# Patient Record
Sex: Male | Born: 1950
Health system: Southern US, Community
[De-identification: ages and names within clinical notes are randomized; demographics above are authoritative.]

## PROBLEM LIST (undated history)

## (undated) DIAGNOSIS — I724 Aneurysm of artery of lower extremity: Secondary | ICD-10-CM

## (undated) DIAGNOSIS — M1990 Unspecified osteoarthritis, unspecified site: Secondary | ICD-10-CM

## (undated) DIAGNOSIS — I739 Peripheral vascular disease, unspecified: Secondary | ICD-10-CM

## (undated) DIAGNOSIS — N200 Calculus of kidney: Secondary | ICD-10-CM

## (undated) DIAGNOSIS — F1010 Alcohol abuse, uncomplicated: Secondary | ICD-10-CM

## (undated) DIAGNOSIS — I10 Essential (primary) hypertension: Secondary | ICD-10-CM

## (undated) DIAGNOSIS — J449 Chronic obstructive pulmonary disease, unspecified: Secondary | ICD-10-CM

## (undated) DIAGNOSIS — R569 Unspecified convulsions: Secondary | ICD-10-CM

## (undated) DIAGNOSIS — F0390 Unspecified dementia without behavioral disturbance: Secondary | ICD-10-CM

## (undated) DIAGNOSIS — M109 Gout, unspecified: Secondary | ICD-10-CM

## (undated) DIAGNOSIS — G40409 Other generalized epilepsy and epileptic syndromes, not intractable, without status epilepticus: Secondary | ICD-10-CM

## (undated) DIAGNOSIS — I639 Cerebral infarction, unspecified: Secondary | ICD-10-CM

## (undated) HISTORY — PX: TONSILLECTOMY: SUR1361

## (undated) HISTORY — DX: Cerebral infarction, unspecified: I63.9

## (undated) HISTORY — DX: Alcohol abuse, uncomplicated: F10.10

---

## 1978-09-09 DIAGNOSIS — G40409 Other generalized epilepsy and epileptic syndromes, not intractable, without status epilepticus: Secondary | ICD-10-CM

## 1978-09-09 HISTORY — PX: HIP SURGERY: SHX245

## 1978-09-09 HISTORY — PX: KIDNEY STONE SURGERY: SHX686

## 1978-09-09 HISTORY — DX: Other generalized epilepsy and epileptic syndromes, not intractable, without status epilepticus: G40.409

## 1978-09-09 HISTORY — PX: OTHER SURGICAL HISTORY: SHX169

## 1978-09-09 HISTORY — PX: WRIST SURGERY: SHX841

## 1998-06-23 ENCOUNTER — Ambulatory Visit (HOSPITAL_COMMUNITY): Admission: RE | Admit: 1998-06-23 | Discharge: 1998-06-23 | Payer: Self-pay | Admitting: Urology

## 2001-06-15 ENCOUNTER — Encounter: Payer: Self-pay | Admitting: Emergency Medicine

## 2001-06-15 ENCOUNTER — Emergency Department (HOSPITAL_COMMUNITY): Admission: EM | Admit: 2001-06-15 | Discharge: 2001-06-15 | Payer: Self-pay | Admitting: Emergency Medicine

## 2002-12-21 ENCOUNTER — Emergency Department (HOSPITAL_COMMUNITY): Admission: AD | Admit: 2002-12-21 | Discharge: 2002-12-21 | Payer: Self-pay | Admitting: Family Medicine

## 2003-04-22 ENCOUNTER — Emergency Department (HOSPITAL_COMMUNITY): Admission: EM | Admit: 2003-04-22 | Discharge: 2003-04-22 | Payer: Self-pay | Admitting: Emergency Medicine

## 2004-08-27 ENCOUNTER — Emergency Department (HOSPITAL_COMMUNITY): Admission: EM | Admit: 2004-08-27 | Discharge: 2004-08-27 | Payer: Self-pay | Admitting: *Deleted

## 2004-10-26 ENCOUNTER — Ambulatory Visit: Payer: Self-pay | Admitting: Internal Medicine

## 2004-11-09 ENCOUNTER — Ambulatory Visit: Payer: Self-pay | Admitting: Internal Medicine

## 2005-06-07 ENCOUNTER — Encounter: Admission: RE | Admit: 2005-06-07 | Discharge: 2005-06-07 | Payer: Self-pay | Admitting: Gastroenterology

## 2005-06-21 ENCOUNTER — Ambulatory Visit: Payer: Self-pay | Admitting: Internal Medicine

## 2005-07-10 ENCOUNTER — Ambulatory Visit (HOSPITAL_COMMUNITY): Admission: RE | Admit: 2005-07-10 | Discharge: 2005-07-10 | Payer: Self-pay | Admitting: Urology

## 2005-09-23 ENCOUNTER — Emergency Department (HOSPITAL_COMMUNITY): Admission: EM | Admit: 2005-09-23 | Discharge: 2005-09-23 | Payer: Self-pay | Admitting: Emergency Medicine

## 2005-09-25 ENCOUNTER — Ambulatory Visit: Payer: Self-pay | Admitting: Hospitalist

## 2005-10-16 ENCOUNTER — Ambulatory Visit: Payer: Self-pay | Admitting: Internal Medicine

## 2005-11-15 ENCOUNTER — Ambulatory Visit: Payer: Self-pay | Admitting: Hospitalist

## 2005-11-15 DIAGNOSIS — I10 Essential (primary) hypertension: Secondary | ICD-10-CM | POA: Insufficient documentation

## 2005-11-15 DIAGNOSIS — R569 Unspecified convulsions: Secondary | ICD-10-CM

## 2005-12-07 ENCOUNTER — Ambulatory Visit: Payer: Self-pay | Admitting: Hospitalist

## 2005-12-07 LAB — CONVERTED CEMR LAB
BUN: 11 mg/dL (ref 6–23)
CO2: 27 meq/L (ref 19–32)
Calcium: 9.1 mg/dL (ref 8.4–10.5)
Chloride: 96 meq/L (ref 96–112)
Creatinine, Ser: 0.91 mg/dL (ref 0.40–1.50)
Glucose, Bld: 85 mg/dL (ref 70–99)
Potassium: 4.8 meq/L (ref 3.5–5.3)
Sodium: 132 meq/L — ABNORMAL LOW (ref 135–145)

## 2006-01-25 DIAGNOSIS — F528 Other sexual dysfunction not due to a substance or known physiological condition: Secondary | ICD-10-CM | POA: Insufficient documentation

## 2006-04-25 ENCOUNTER — Emergency Department (HOSPITAL_COMMUNITY): Admission: EM | Admit: 2006-04-25 | Discharge: 2006-04-25 | Payer: Self-pay | Admitting: Emergency Medicine

## 2006-06-13 DIAGNOSIS — R609 Edema, unspecified: Secondary | ICD-10-CM

## 2006-06-20 ENCOUNTER — Telehealth: Payer: Self-pay | Admitting: *Deleted

## 2006-06-20 ENCOUNTER — Ambulatory Visit: Payer: Self-pay | Admitting: Internal Medicine

## 2006-06-20 ENCOUNTER — Ambulatory Visit (HOSPITAL_COMMUNITY): Admission: RE | Admit: 2006-06-20 | Discharge: 2006-06-20 | Payer: Self-pay | Admitting: Internal Medicine

## 2006-06-20 ENCOUNTER — Ambulatory Visit: Payer: Self-pay | Admitting: Vascular Surgery

## 2006-06-20 ENCOUNTER — Encounter (INDEPENDENT_AMBULATORY_CARE_PROVIDER_SITE_OTHER): Payer: Self-pay | Admitting: Unknown Physician Specialty

## 2007-01-17 ENCOUNTER — Telehealth: Payer: Self-pay | Admitting: *Deleted

## 2007-01-20 ENCOUNTER — Emergency Department (HOSPITAL_COMMUNITY): Admission: EM | Admit: 2007-01-20 | Discharge: 2007-01-20 | Payer: Self-pay | Admitting: Family Medicine

## 2007-01-23 ENCOUNTER — Telehealth: Payer: Self-pay | Admitting: Infectious Disease

## 2007-02-06 ENCOUNTER — Ambulatory Visit: Payer: Self-pay | Admitting: Internal Medicine

## 2007-02-06 ENCOUNTER — Encounter (INDEPENDENT_AMBULATORY_CARE_PROVIDER_SITE_OTHER): Payer: Self-pay | Admitting: Internal Medicine

## 2007-02-06 DIAGNOSIS — F17200 Nicotine dependence, unspecified, uncomplicated: Secondary | ICD-10-CM | POA: Insufficient documentation

## 2007-02-06 LAB — CONVERTED CEMR LAB
ALT: 23 units/L (ref 0–53)
AST: 21 units/L (ref 0–37)
Albumin: 4.4 g/dL (ref 3.5–5.2)
Alkaline Phosphatase: 112 units/L (ref 39–117)
BUN: 11 mg/dL (ref 6–23)
CO2: 26 meq/L (ref 19–32)
Calcium: 9.5 mg/dL (ref 8.4–10.5)
Carbamazepine Lvl: 11.3 ug/mL (ref 4.0–12.0)
Chloride: 97 meq/L (ref 96–112)
Creatinine, Ser: 0.95 mg/dL (ref 0.40–1.50)
Glucose, Bld: 82 mg/dL (ref 70–99)
HCT: 43.9 % (ref 39.0–52.0)
Hemoglobin: 15.7 g/dL (ref 13.0–17.0)
MCHC: 35.8 g/dL (ref 30.0–36.0)
MCV: 95 fL (ref 78.0–100.0)
Platelets: 348 10*3/uL (ref 150–400)
Potassium: 4.9 meq/L (ref 3.5–5.3)
RBC: 4.62 M/uL (ref 4.22–5.81)
RDW: 14.2 % (ref 11.5–15.5)
Sodium: 134 meq/L — ABNORMAL LOW (ref 135–145)
Total Bilirubin: 0.3 mg/dL (ref 0.3–1.2)
Total Protein: 7.2 g/dL (ref 6.0–8.3)
WBC: 11.2 10*3/uL — ABNORMAL HIGH (ref 4.0–10.5)

## 2008-11-18 ENCOUNTER — Encounter: Admission: RE | Admit: 2008-11-18 | Discharge: 2008-11-18 | Payer: Self-pay | Admitting: Family Medicine

## 2009-11-24 ENCOUNTER — Encounter: Admission: RE | Admit: 2009-11-24 | Discharge: 2009-11-24 | Payer: Self-pay | Admitting: Orthopedic Surgery

## 2010-09-09 DIAGNOSIS — I639 Cerebral infarction, unspecified: Secondary | ICD-10-CM

## 2010-09-09 HISTORY — DX: Cerebral infarction, unspecified: I63.9

## 2011-05-17 ENCOUNTER — Ambulatory Visit
Admission: RE | Admit: 2011-05-17 | Discharge: 2011-05-17 | Disposition: A | Discharge status: Home / Self Care | Payer: Medicare Other | Source: Ambulatory Visit | Attending: Family Medicine | Admitting: Family Medicine

## 2011-05-17 ENCOUNTER — Other Ambulatory Visit: Payer: Self-pay | Admitting: Family Medicine

## 2011-05-17 DIAGNOSIS — F17200 Nicotine dependence, unspecified, uncomplicated: Secondary | ICD-10-CM

## 2011-05-17 DIAGNOSIS — R634 Abnormal weight loss: Secondary | ICD-10-CM

## 2011-10-18 ENCOUNTER — Other Ambulatory Visit: Payer: Self-pay | Admitting: Cardiology

## 2011-10-18 DIAGNOSIS — R0989 Other specified symptoms and signs involving the circulatory and respiratory systems: Secondary | ICD-10-CM

## 2011-10-23 ENCOUNTER — Other Ambulatory Visit: Payer: Self-pay | Admitting: *Deleted

## 2011-10-23 ENCOUNTER — Encounter (INDEPENDENT_AMBULATORY_CARE_PROVIDER_SITE_OTHER): Payer: Medicare Other

## 2011-10-23 DIAGNOSIS — I70219 Atherosclerosis of native arteries of extremities with intermittent claudication, unspecified extremity: Secondary | ICD-10-CM

## 2011-10-23 DIAGNOSIS — R0989 Other specified symptoms and signs involving the circulatory and respiratory systems: Secondary | ICD-10-CM

## 2011-10-23 DIAGNOSIS — I70229 Atherosclerosis of native arteries of extremities with rest pain, unspecified extremity: Secondary | ICD-10-CM

## 2011-10-23 DIAGNOSIS — I739 Peripheral vascular disease, unspecified: Secondary | ICD-10-CM

## 2011-10-24 ENCOUNTER — Encounter: Payer: Self-pay | Admitting: Cardiovascular Disease

## 2011-10-24 ENCOUNTER — Ambulatory Visit (INDEPENDENT_AMBULATORY_CARE_PROVIDER_SITE_OTHER): Payer: Medicare Other | Admitting: Cardiovascular Disease

## 2011-10-24 VITALS — BP 146/86 | HR 84 | Ht 69.0 in | Wt 163.0 lb

## 2011-10-24 DIAGNOSIS — Z01818 Encounter for other preprocedural examination: Secondary | ICD-10-CM

## 2011-10-24 DIAGNOSIS — F17200 Nicotine dependence, unspecified, uncomplicated: Secondary | ICD-10-CM

## 2011-10-24 DIAGNOSIS — I739 Peripheral vascular disease, unspecified: Secondary | ICD-10-CM

## 2011-10-24 LAB — BASIC METABOLIC PANEL
BUN: 9 mg/dL (ref 6–23)
CO2: 29 mEq/L (ref 19–32)
Chloride: 94 mEq/L — ABNORMAL LOW (ref 96–112)
Glucose, Bld: 87 mg/dL (ref 70–99)
Potassium: 4.5 mEq/L (ref 3.5–5.1)

## 2011-10-24 LAB — CBC WITH DIFFERENTIAL/PLATELET
Basophils Relative: 0.4 % (ref 0.0–3.0)
Eosinophils Absolute: 0.2 10*3/uL (ref 0.0–0.7)
HCT: 45.5 % (ref 39.0–52.0)
Hemoglobin: 15.2 g/dL (ref 13.0–17.0)
Lymphocytes Relative: 29.4 % (ref 12.0–46.0)
MCHC: 33.4 g/dL (ref 30.0–36.0)
Neutro Abs: 5.9 10*3/uL (ref 1.4–7.7)
RBC: 4.42 Mil/uL (ref 4.22–5.81)

## 2011-10-24 LAB — PROTIME-INR: Prothrombin Time: 11.5 s (ref 10.2–12.4)

## 2011-10-24 MED ORDER — OXYCODONE HCL 5 MG PO CAPS: 5.0000 mg | ORAL_CAPSULE | Freq: Four times a day (QID) | ORAL | Status: DC | PRN

## 2011-10-24 NOTE — Patient Instructions (Addendum)
Your physician recommends that you return for lab work in: today  Your physician recommends that you continue on your current medications as directed. Please refer to the Current Medication list given to you today.  You physician recommends that you have an Abdominal Aortagram  Your physician recommends that you schedule a follow-up appointment in: after procedure

## 2011-10-25 ENCOUNTER — Encounter (HOSPITAL_COMMUNITY): Payer: Self-pay | Admitting: Pharmacy Technician

## 2011-10-28 DIAGNOSIS — I739 Peripheral vascular disease, unspecified: Secondary | ICD-10-CM | POA: Insufficient documentation

## 2011-10-28 NOTE — Progress Notes (Signed)
HPI  This is a 61 year old African American male who is here today for evaluation and management of recently diagnosed peripheral arterial disease. He was referred from [facility] to our vascular lab for ABI and arterial duplex. The patient went there for evaluation of severe pain in both feet and calves. This started about 2 months ago and has been progressive since then. It's worse on the right side and currently happening with ambulation of less than 50 feet. He also has discomfort at rest especially at night while lying down. He denies any lower extremity ulceration. He has prolonged history of tobacco use. He smokes 2 packs per day and has been doing so for 40 years. He also reports having a stroke in 2012 and thought that the discomfort was due to that. He reports no previous cardiac history. His other risk factors include hypertension but no known diabetes or hyperlipidemia. He denies any chest pain. He has chronic dyspnea without orthopnea or lower extremity edema.   Allergies  Allergen Reactions  . Penicillins Nausea And Vomiting     Current Outpatient Prescriptions on File Prior to Visit  Medication Sig Dispense Refill  . EPITOL 200 MG tablet Take 400 mg by mouth 3 (three) times daily.       [name] gabapentin (NEURONTIN) 600 MG tablet Take 600 mg by mouth 3 (three) times daily.       [name] levETIRAcetam (KEPPRA) 500 MG tablet Take 500 mg by mouth 2 (two) times daily.       [name] lisinopril (PRINIVIL,ZESTRIL) 20 MG tablet Take 20 mg by mouth daily.          No past medical history on file.   No past surgical history on file.   No family history on file.   History   Social History  . Marital Status: Married    Spouse Name: N/A    Number of Children: N/A  . Years of Education: N/A   Occupational History  . Not on file.   Social History Main Topics  . Smoking status: Current Every Day Smoker -- 1.5 packs/day for 40 years    Types: Cigarettes  . Smokeless tobacco: Not on  file  . Alcohol Use: No  . Drug Use: No  . Sexually Active: Not on file   Other Topics Concern  . Not on file   Social History Narrative  . No narrative on file     ROS Constitutional: Negative for fever, chills, diaphoresis, activity change, appetite change and fatigue.  HENT: Negative for hearing loss, nosebleeds, congestion, sore throat, facial swelling, drooling, trouble swallowing, neck pain, voice change, sinus pressure and tinnitus.  Eyes: Negative for photophobia, pain, discharge and visual disturbance.  Respiratory: Negative for apnea, cough, chest tightness, shortness of breath and wheezing.  Cardiovascular: Negative for chest pain, palpitations and leg swelling.  Gastrointestinal: Negative for nausea, vomiting, abdominal pain, diarrhea, constipation, blood in stool and abdominal distention.  Genitourinary: Negative for dysuria, urgency, frequency, hematuria and decreased urine volume.  Skin: Negative for color change, pallor, rash and wound.  Neurological: Negative for dizziness, tremors, seizures, syncope, speech difficulty, weakness, light-headedness, numbness and headaches.  Psychiatric/Behavioral: Negative for suicidal ideas, hallucinations, behavioral problems and agitation. The patient is not nervous/anxious.     PHYSICAL EXAM   BP 146/86  Pulse 84  Ht 5\' 9"  (1.753 m)  Wt 163 lb (73.936 kg)  BMI 24.07 kg/m2 Constitutional: He is oriented to person, place, and time. He appears well-developed  and well-nourished. No distress.  HENT: No nasal discharge.  Head: Normocephalic and atraumatic.  Eyes: Pupils are equal and round. Right eye exhibits no discharge. Left eye exhibits no discharge.  Neck: Normal range of motion. Neck supple. No JVD present. No thyromegaly present. No carotid bruits. Cardiovascular: Normal rate, regular rhythm, normal heart sounds and. Exam reveals no gallop and no friction rub. No murmur heard.  Pulmonary/Chest: Effort normal and breath  sounds normal. No stridor. No respiratory distress. He has no wheezes. He has no rales. He exhibits no tenderness.  Abdominal: Soft. Bowel sounds are normal. He exhibits no distension. There is no tenderness. There is no rebound and no guarding.  Musculoskeletal: Normal range of motion. He exhibits no edema and no tenderness.  Neurological: He is alert and oriented to person, place, and time. Coordination normal.  Skin: Skin is warm and dry. No rash noted. He is not diaphoretic. No erythema. No pallor.  Psychiatric: He has a normal mood and affect. His behavior is normal. Judgment and thought content normal.   vascular: Radial pulse: +2 on the right side and +1 on the left side. Femoral pulse: Not palpable bilaterally. Distal pulses are not palpable.     EKG: NORMAL SINUS RHYTHM, LEFT ANTERIOR FASCICULAR BLOCK. NO SIGNIFICANT ST OR T WAVE CHANGES.   ASSESSMENT AND PLAN

## 2011-10-28 NOTE — Assessment & Plan Note (Signed)
The patient has evidence of severe peripheral arterial disease with early critical limb ischemia. Currently Rutherford class 4  Manifesting with rest pain without ulceration or skin breakdown. His ABI was severely reduced bilaterally less than 0.3 on the right side and 0.34 on the left side. There was monophasic waveform bilaterally in the common femoral artery suggestive of severe aortoiliac disease. There was also evidence of multilevel disease including the common femoral, SFA as well as bilateral popliteal artery occlusion. I recommend proceeding with angiography in order to plan revascularization. The best vascular access in this situation might be the right radial artery or possibly the right femoral artery although there is a chance of total occlusion. He will likely require both endovascular as well as surgical revascularization. Continue aspirin for now. I provided him with oxycodone 5 mg to be used every 6 hours as needed. Quantity of 60 was provided.

## 2011-10-28 NOTE — Assessment & Plan Note (Signed)
I had a prolonged discussion with him about the importance of smoking cessation. I explained to him the high risk of limb loss if he continues to smoke.

## 2011-10-31 ENCOUNTER — Other Ambulatory Visit: Payer: Self-pay | Admitting: *Deleted

## 2011-10-31 ENCOUNTER — Telehealth: Payer: Self-pay | Admitting: Surgery

## 2011-10-31 ENCOUNTER — Encounter (HOSPITAL_COMMUNITY)
Admission: RE | Disposition: A | Discharge status: Home / Self Care | Payer: Self-pay | Source: Ambulatory Visit | Attending: Cardiovascular Disease

## 2011-10-31 ENCOUNTER — Ambulatory Visit (HOSPITAL_COMMUNITY)
Admission: RE | Admit: 2011-10-31 | Discharge: 2011-10-31 | Disposition: A | Discharge status: Home / Self Care | Payer: Medicare Other | Source: Ambulatory Visit | Attending: Cardiovascular Disease | Admitting: Cardiovascular Disease

## 2011-10-31 DIAGNOSIS — I739 Peripheral vascular disease, unspecified: Secondary | ICD-10-CM

## 2011-10-31 DIAGNOSIS — I70219 Atherosclerosis of native arteries of extremities with intermittent claudication, unspecified extremity: Secondary | ICD-10-CM

## 2011-10-31 DIAGNOSIS — I6529 Occlusion and stenosis of unspecified carotid artery: Secondary | ICD-10-CM

## 2011-10-31 DIAGNOSIS — I708 Atherosclerosis of other arteries: Secondary | ICD-10-CM | POA: Insufficient documentation

## 2011-10-31 HISTORY — PX: ABDOMINAL AORTAGRAM: SHX5454

## 2011-10-31 HISTORY — PX: OTHER SURGICAL HISTORY: SHX169

## 2011-10-31 SURGERY — ABDOMINAL AORTAGRAM
Anesthesia: LOCAL

## 2011-10-31 MED ORDER — ASPIRIN 81 MG PO CHEW
CHEWABLE_TABLET | ORAL | Status: AC
  Filled 2011-10-31: qty 4

## 2011-10-31 MED ORDER — LABETALOL HCL 5 MG/ML IV SOLN
INTRAVENOUS | Status: AC
  Filled 2011-10-31: qty 4

## 2011-10-31 MED ORDER — LISINOPRIL 20 MG PO TABS
20.0000 mg | ORAL_TABLET | Freq: Every day | ORAL | Status: DC
  Administered 2011-10-31: 20 mg via ORAL
  Filled 2011-10-31: qty 1

## 2011-10-31 MED ORDER — HEPARIN (PORCINE) IN NACL 2-0.9 UNIT/ML-% IJ SOLN
INTRAMUSCULAR | Status: AC
  Filled 2011-10-31: qty 1000

## 2011-10-31 MED ORDER — LEVETIRACETAM 500 MG PO TABS
500.0000 mg | ORAL_TABLET | Freq: Two times a day (BID) | ORAL | Status: DC
  Administered 2011-10-31: 500 mg via ORAL
  Filled 2011-10-31 (×2): qty 1

## 2011-10-31 MED ORDER — SODIUM CHLORIDE 0.9 % IJ SOLN: 3.0000 mL | INTRAMUSCULAR | Status: DC | PRN

## 2011-10-31 MED ORDER — SODIUM CHLORIDE 0.9 % IV SOLN: INTRAVENOUS | Status: DC

## 2011-10-31 MED ORDER — CARBAMAZEPINE 200 MG PO TABS
400.0000 mg | ORAL_TABLET | Freq: Three times a day (TID) | ORAL | Status: DC
  Administered 2011-10-31: 400 mg via ORAL
  Filled 2011-10-31 (×3): qty 2

## 2011-10-31 MED ORDER — SODIUM CHLORIDE 0.9 % IV SOLN: 250.0000 mL | INTRAVENOUS | Status: DC | PRN

## 2011-10-31 MED ORDER — HYDRALAZINE HCL 20 MG/ML IJ SOLN
INTRAMUSCULAR | Status: AC
  Filled 2011-10-31: qty 1

## 2011-10-31 MED ORDER — GABAPENTIN 600 MG PO TABS
600.0000 mg | ORAL_TABLET | Freq: Three times a day (TID) | ORAL | Status: DC
  Administered 2011-10-31: 600 mg via ORAL
  Filled 2011-10-31 (×3): qty 1

## 2011-10-31 MED ORDER — MORPHINE SULFATE 4 MG/ML IJ SOLN
2.0000 mg | INTRAMUSCULAR | Status: DC | PRN
  Administered 2011-10-31: 2 mg via INTRAVENOUS
  Filled 2011-10-31: qty 1

## 2011-10-31 MED ORDER — ASPIRIN 81 MG PO CHEW
324.0000 mg | CHEWABLE_TABLET | ORAL | Status: AC
  Administered 2011-10-31: 324 mg via ORAL

## 2011-10-31 MED ORDER — MIDAZOLAM HCL 2 MG/2ML IJ SOLN
INTRAMUSCULAR | Status: AC
  Filled 2011-10-31: qty 2

## 2011-10-31 MED ORDER — ACETAMINOPHEN 325 MG PO TABS: 650.0000 mg | ORAL_TABLET | ORAL | Status: DC | PRN

## 2011-10-31 MED ORDER — LIDOCAINE HCL (PF) 1 % IJ SOLN
INTRAMUSCULAR | Status: AC
  Filled 2011-10-31: qty 30

## 2011-10-31 MED ORDER — SODIUM CHLORIDE 0.9 % IJ SOLN: 3.0000 mL | Freq: Two times a day (BID) | INTRAMUSCULAR | Status: DC

## 2011-10-31 MED ORDER — MORPHINE SULFATE 2 MG/ML IJ SOLN
INTRAMUSCULAR | Status: AC
  Filled 2011-10-31: qty 1

## 2011-10-31 MED ORDER — FENTANYL CITRATE 0.05 MG/ML IJ SOLN
INTRAMUSCULAR | Status: AC
  Filled 2011-10-31: qty 2

## 2011-10-31 MED ORDER — SODIUM CHLORIDE 0.9 % IV SOLN
INTRAVENOUS | Status: DC
  Administered 2011-10-31: 1000 mL via INTRAVENOUS

## 2011-10-31 NOTE — Telephone Encounter (Addendum)
Message copied by Shari Prows on Wed Oct 31, 2011  1:39 PM ------      Message from: Melene Plan      Created: Wed Oct 31, 2011 12:09 PM       Have fun!!!      ----- Message -----         From: Nada Libman, MD         Sent: 10/31/2011   9:39 AM           To: Melene Plan, RN            Please schedule patient to see me in the office for aorto bifem.  Patient of Dr. Kirke Corin.  HAve him get carotid dopplers prior to seeing me.  I need to see him Monday.  We need to contact him       I scheduled an appt for the above pt for Monday 11/05/11 at 3pm for a carotid doppler and to see VWB at 4pm. I left a message for the pt at both ph#'s and also mailed an appt letter.awt

## 2011-10-31 NOTE — H&P (View-Only) (Signed)
  HPI  This is a 61-year-old African American male who is here today for evaluation and management of recently diagnosed peripheral arterial disease. He was referred from [facility] to our vascular lab for ABI and arterial duplex. The patient went there for evaluation of severe pain in both feet and calves. This started about 2 months ago and has been progressive since then. It's worse on the right side and currently happening with ambulation of less than 50 feet. He also has discomfort at rest especially at night while lying down. He denies any lower extremity ulceration. He has prolonged history of tobacco use. He smokes 2 packs per day and has been doing so for 40 years. He also reports having a stroke in 2012 and thought that the discomfort was due to that. He reports no previous cardiac history. His other risk factors include hypertension but no known diabetes or hyperlipidemia. He denies any chest pain. He has chronic dyspnea without orthopnea or lower extremity edema.   Allergies  Allergen Reactions  . Penicillins Nausea And Vomiting     Current Outpatient Prescriptions on File Prior to Visit  Medication Sig Dispense Refill  . EPITOL 200 MG tablet Take 400 mg by mouth 3 (three) times daily.       . gabapentin (NEURONTIN) 600 MG tablet Take 600 mg by mouth 3 (three) times daily.       . levETIRAcetam (KEPPRA) 500 MG tablet Take 500 mg by mouth 2 (two) times daily.       . lisinopril (PRINIVIL,ZESTRIL) 20 MG tablet Take 20 mg by mouth daily.          No past medical history on file.   No past surgical history on file.   No family history on file.   History   Social History  . Marital Status: Married    Spouse Name: N/A    Number of Children: N/A  . Years of Education: N/A   Occupational History  . Not on file.   Social History Main Topics  . Smoking status: Current Every Day Smoker -- 1.5 packs/day for 40 years    Types: Cigarettes  . Smokeless tobacco: Not on  file  . Alcohol Use: No  . Drug Use: No  . Sexually Active: Not on file   Other Topics Concern  . Not on file   Social History Narrative  . No narrative on file     ROS Constitutional: Negative for fever, chills, diaphoresis, activity change, appetite change and fatigue.  HENT: Negative for hearing loss, nosebleeds, congestion, sore throat, facial swelling, drooling, trouble swallowing, neck pain, voice change, sinus pressure and tinnitus.  Eyes: Negative for photophobia, pain, discharge and visual disturbance.  Respiratory: Negative for apnea, cough, chest tightness, shortness of breath and wheezing.  Cardiovascular: Negative for chest pain, palpitations and leg swelling.  Gastrointestinal: Negative for nausea, vomiting, abdominal pain, diarrhea, constipation, blood in stool and abdominal distention.  Genitourinary: Negative for dysuria, urgency, frequency, hematuria and decreased urine volume.  Skin: Negative for color change, pallor, rash and wound.  Neurological: Negative for dizziness, tremors, seizures, syncope, speech difficulty, weakness, light-headedness, numbness and headaches.  Psychiatric/Behavioral: Negative for suicidal ideas, hallucinations, behavioral problems and agitation. The patient is not nervous/anxious.     PHYSICAL EXAM   BP 146/86  Pulse 84  Ht 5' 9" (1.753 m)  Wt 163 lb (73.936 kg)  BMI 24.07 kg/m2 Constitutional: He is oriented to person, place, and time. He appears well-developed   and well-nourished. No distress.  HENT: No nasal discharge.  Head: Normocephalic and atraumatic.  Eyes: Pupils are equal and round. Right eye exhibits no discharge. Left eye exhibits no discharge.  Neck: Normal range of motion. Neck supple. No JVD present. No thyromegaly present. No carotid bruits. Cardiovascular: Normal rate, regular rhythm, normal heart sounds and. Exam reveals no gallop and no friction rub. No murmur heard.  Pulmonary/Chest: Effort normal and breath  sounds normal. No stridor. No respiratory distress. He has no wheezes. He has no rales. He exhibits no tenderness.  Abdominal: Soft. Bowel sounds are normal. He exhibits no distension. There is no tenderness. There is no rebound and no guarding.  Musculoskeletal: Normal range of motion. He exhibits no edema and no tenderness.  Neurological: He is alert and oriented to person, place, and time. Coordination normal.  Skin: Skin is warm and dry. No rash noted. He is not diaphoretic. No erythema. No pallor.  Psychiatric: He has a normal mood and affect. His behavior is normal. Judgment and thought content normal.   vascular: Radial pulse: +2 on the right side and +1 on the left side. Femoral pulse: Not palpable bilaterally. Distal pulses are not palpable.     EKG: NORMAL SINUS RHYTHM, LEFT ANTERIOR FASCICULAR BLOCK. NO SIGNIFICANT ST OR T WAVE CHANGES.   ASSESSMENT AND PLAN   

## 2011-10-31 NOTE — Interval H&P Note (Signed)
History and Physical Interval Note:  10/31/2011 8:37 AM  Lee [name]  has presented today for surgery, with the diagnosis of pvd  The various methods of treatment have been discussed with the patient and family. After consideration of risks, benefits and other options for treatment, the patient has consented to  Procedure(s) (LRB) with comments: ABDOMINAL AORTAGRAM (N/A) as a surgical intervention .  The patient's history has been reviewed, patient examined, no change in status, stable for surgery.  I have reviewed the patient's chart and labs.  Questions were answered to the patient's satisfaction.     Lorine Bears

## 2011-10-31 NOTE — CV Procedure (Signed)
     PERIPHERAL VASCULAR PROCEDURE  NAME:  Lee [name]   MRN: [id number] DOB:  Jan 04, 1951   ADMIT DATE: 10/31/2011  Performing Cardiologist: Lorine Bears Primary Physician: No primary provider on file.   Procedures Performed:  Abdominal Aortic Angiogram with Bi-Iliofemoral Runoff  Bilateral Lower Extremity Angiography (1st Order)  Indication(s):   Claudication with rest pain and severely reduced ABI bilaterally   Consent: The procedure with Risks/Benefits/Alternatives and Indications was reviewed with the patient .  All questions were answered.  Medications:  Sedation:  1 mg IV Versed, 25 mcg IV Fentanyl  Contrast:  175 mL of Visipaque   Procedural details: The right groin was prepped, draped, and anesthetized with 1% lidocaine. There was no femoral palpable pulse. However, the vessel was heavily calcified on fluoroscopy. I used a micropuncture kit .the micropuncture sheath was introduced into artery without much difficulty. Angiography was performed through this sheath which showed an occluded right common iliac. The sheath was exchanged to a 5 [place] sheath. I was able to cross the occlusion with a Glidewire.  A 5 Fr Short Pigtail Catheter was advanced of over a Glidewire into the descending Aorta to a level just above the renal arteries. A power injection of 38ml/sec contrast over 1 sec was performed for Abdominal Aortic Angiography.  The catheter was then pulled back to a level just above the Aortic bifurcation, and a second power injection was performed to evaluate bi-ileiofemoral arteries with runnoff. Iliac angiography was performed in the LAO and RAO views.   T  Hemodynamics:  Central Aortic Pressure / Mean Aortic Pressure: 153/66.  Findings:  Abdominal aorta: Normal in size with no evidence of aneurysm. The aorta is heavily calcified.  Left renal artery: Normal.  Right renal artery: Normal.  Celiac artery: Patent.  Superior mesenteric artery: Patent with  no significant disease.  Right common iliac artery: Heavily calcified and subtotally occluded proximally.  Right internal iliac artery: 40% ostial disease. The mid segment is diffusely diseased.  Right external iliac artery: 20% diffuse disease.  Right common femoral artery: Heavily calcified and subtotally occluded.  Right profunda femoral artery: Normal in size and calcified. It has mild diffuse disease.  Right superficial femoral artery: Flush occlusion and heavily calcified. It reconstitutes via collaterals distally.  Right popliteal artery: Occluded.  Below the knee arteries are hypoperfused but there seems to be three-vessel runoff.   Left common iliac artery:  Heavily calcified with 70% proximal disease.  Left internal iliac artery: 60% proximal disease and diffusely disease in the midsegment.  Left external iliac artery: Occluded proximally.  Left common femoral artery: Heavily calcified and appears occluded. It reconstitutes in the distal segment with collaterals.  Left profunda femoral artery: Normal in size with diffuse mild disease.  Left superficial femoral artery:  Heavily calcified with diffuse 50% disease proximally. The distal segment has mild atherosclerosis.  Left popliteal artery: Occluded. The tibial arteries reconstitute distally with three-vessel runoff.  Conclusions: 1. occluded right common iliac with severe disease in the left common iliac artery. 2. Occluded left external iliac artery as well as bilateral common femoral arteries. 3. Flush occlusion of the right SFA with reconstitution distally. 4. Occluded popliteal arteries bilaterally with three-vessel runoff on both sides.  Recommendations:  Aorta bifemoral bypass is recommended. The case was discussed with Dr. Myra Gianotti who will see the patient.   Lorine Bears, MD, [facility] 10/31/2011 9:24 AM

## 2011-11-02 ENCOUNTER — Encounter: Payer: Self-pay | Admitting: Surgery

## 2011-11-05 ENCOUNTER — Ambulatory Visit (INDEPENDENT_AMBULATORY_CARE_PROVIDER_SITE_OTHER): Payer: Medicare Other | Admitting: Surgery

## 2011-11-05 ENCOUNTER — Encounter: Payer: Self-pay | Admitting: Surgery

## 2011-11-05 ENCOUNTER — Other Ambulatory Visit (INDEPENDENT_AMBULATORY_CARE_PROVIDER_SITE_OTHER): Payer: Medicare Other | Admitting: *Deleted

## 2011-11-05 VITALS — BP 148/90 | HR 65 | Resp 16 | Ht 69.0 in | Wt 163.6 lb

## 2011-11-05 DIAGNOSIS — M79609 Pain in unspecified limb: Secondary | ICD-10-CM

## 2011-11-05 DIAGNOSIS — I739 Peripheral vascular disease, unspecified: Secondary | ICD-10-CM

## 2011-11-05 DIAGNOSIS — I7092 Chronic total occlusion of artery of the extremities: Secondary | ICD-10-CM

## 2011-11-05 DIAGNOSIS — I6529 Occlusion and stenosis of unspecified carotid artery: Secondary | ICD-10-CM

## 2011-11-05 DIAGNOSIS — Z01810 Encounter for preprocedural cardiovascular examination: Secondary | ICD-10-CM

## 2011-11-05 NOTE — Progress Notes (Signed)
[facility] [place]   Patient name: Lee [name] MRN: [id number] DOB: March 03, 1950 Sex: male   Referred by: Dr. Kirke Corin  Reason for referral:  Chief Complaint  Patient presents with  . PAD    Bilateral angiogtam of 10/31/11, bilateral pain duration 2-3 months  . PVD    HISTORY OF PRESENT ILLNESS: Patient comes in today for further evaluation of his peripheral vascular disease. Patient began having pain in both of his feet approximately 3-4 months ago. [facility] initially was seen by [facility] who referred him to [facility] for further diagnostic imaging and evaluation. There he was found to have ankle-brachial indices and the 0.3 range and was scheduled for angiogram this past week. Angiographic findings include bilateral iliac artery occlusion. Severe common femoral disease bilaterally. The right superficial femoral artery is occluded. Bilateral popliteal arteries are occluded. Diffuse tibial disease bilaterally. He appears to have single-vessel runoff on the right via the anterior tibial artery. All 3 vessels appear to be patent on the left. There is diminutive peroneal on the right. The patient reports constant pain in both his feet. This does keep him up at night. He is limited to walking less than 50 feet before his legs give out and clamp.  The patient long-standing history of smoking. He is trying to quit. He suffers from hypertension which is managed with an ACE inhibitor. He also has a seizure disorder. His last seizure was approximately 6 months ago. He does have a history of stroke. He has no residual deficits.  Past Medical History  Diagnosis Date  . Stroke Sept. 2012    Past Surgical History  Procedure Date  . Angiogram bilateral Oct. 23, 2013    History   Social History  . Marital Status: Married    Spouse Name: N/A    Number of Children: N/A  . Years of Education: N/A   Occupational History  . Not on file.   Social History Main Topics  .  Smoking status: Current Every Day Smoker -- 1.5 packs/day for 40 years    Types: Cigarettes  . Smokeless tobacco: Not on file  . Alcohol Use: No  . Drug Use: No  . Sexually Active: Not on file   Other Topics Concern  . Not on file   Social History Narrative  . No narrative on file    Family History  Problem Relation Age of Onset  . Heart disease Mother   . Stroke Father     Allergies as of 11/05/2011 - Review Complete 11/05/2011  Allergen Reaction Noted  . Penicillins Nausea And Vomiting     Current Outpatient Prescriptions on File Prior to Visit  Medication Sig Dispense Refill  . aspirin 81 MG tablet Take 81 mg by mouth daily.      . EPITOL 200 MG tablet Take 400 mg by mouth 3 (three) times daily.       [name] gabapentin (NEURONTIN) 600 MG tablet Take 600 mg by mouth 3 (three) times daily.       [name] levETIRAcetam (KEPPRA) 500 MG tablet Take 500 mg by mouth 2 (two) times daily.       [name] lisinopril (PRINIVIL,ZESTRIL) 20 MG tablet Take 20 mg by mouth daily.       . Multiple Vitamin (MULTIVITAMIN) tablet Take 1 tablet by mouth daily.      [name] oxycodone (OXY-IR) 5 MG capsule Take 1 capsule (5 mg total) by mouth every 6 (six) hours as needed for pain.  60 capsule  0  . vitamin E 400 UNIT capsule Take 400 Units by mouth daily.         REVIEW OF SYSTEMS: Cardiovascular: No chest pain, chest pressure, palpitations, orthopnea, or dyspnea on exertion. Positive rest pain and claudication,  No history of DVT or phlebitis. Pulmonary: No productive cough, asthma or wheezing. Neurologic: Positive leg weakness. Hematologic: No bleeding problems or clotting disorders. Musculoskeletal: No joint pain or joint swelling. Gastrointestinal: No blood in stool or hematemesis Genitourinary: No dysuria or hematuria. Psychiatric:: No history of major depression. Integumentary: No rashes or ulcers. Constitutional: No fever.  positive  chills.  PHYSICAL EXAMINATION: General: The patient appears their  stated age.  Vital signs are BP 148/90  Pulse 65  Resp 16  Ht 5\' 9"  (1.753 m)  Wt 163 lb 9.6 oz (74.208 kg)  BMI 24.16 kg/m2  SpO2 100% HEENT:  No gross abnormalities Pulmonary: Respirations are non-labored Abdomen: Soft and non-tender  Musculoskeletal: There are no major deformities.   Neurologic: No focal weakness or paresthesias are detected, Skin: There are no ulcer or rashes noted. Psychiatric: The patient has normal affect. Cardiovascular: There is a regular rate and rhythm without significant murmur appreciated. No carotid bruits. Pedal pulses are not palpable  Diagnostic Studies: I have reviewed his arteriogram with the above findings. Carotid Doppler study today shows 40-59% left-sided stenosis at 1-39% right.  Outside Studies/Documentation Historical records were reviewed.  They showed severe peripheral vascular disease with rest pain  Medication Changes: None  Assessment:  Peripheral vascular disease with rest pain Plan: I have diagrammed out the patient's multilevel disease. We discussed treatment options which in my opinion are only surgical at this time. I believe he is a candidate for aortobifemoral bypass grafting. He will require common femoral and profunda femoral artery endarterectomy. I discussed all of the potential risks with the patient. These include but are not limited to the need for an additional outflow procedure, the risk of graft infection, the risk of cardiopulmonary complications, the risk of death. We discussed the length of stay and recovery time. All of his questions were answered. He understands that he needs to quit smoking and that he may require additional procedures in the future to help with his symptoms. I offered him the ability to do this this coming Thursday however this was too soon for him therefore his procedure has been scheduled for Friday, November 15. This will be an aortobifemoral bypass graft. This will require bilateral femoral  endarterectomies and profundoplasties. I did get him 30 oxycodone 5 mg tablets today. He will contact me should he develop ulceration or open wounds on his feet.     Jorge Ny, M.D. [facility] [place] Office: [phone] Pager:  [phone]

## 2011-11-13 ENCOUNTER — Telehealth: Payer: Self-pay

## 2011-11-13 ENCOUNTER — Other Ambulatory Visit: Payer: Self-pay | Admitting: *Deleted

## 2011-11-13 ENCOUNTER — Encounter: Payer: Self-pay | Admitting: *Deleted

## 2011-11-13 MED ORDER — OXYCODONE HCL 5 MG PO TABS: ORAL_TABLET | ORAL | Status: DC

## 2011-11-13 NOTE — Telephone Encounter (Signed)
Discussed pain medication refill with Dr. Arbie Cookey.  Advised to give refill of Oxycodone 5 mg. 1-2 tabs q 4 hrs prn, qty. # 40, no refills.

## 2011-11-13 NOTE — Telephone Encounter (Signed)
Pt's. Wife called re: refill on pain medication. Stated pt. Has pain in both legs with right leg pain greater than the left.  Stated she spoke with the MD on call over the weekend and rec'd Rx for Oxycodone on 11/10/11; states pt. Was told he could take 2 tablets every 4 hrs. And only has 4 tabs left.  Scheduled for Aortobifemoral Bypass Graft on 11/23/11.  Will discuss w/ MD re: refill on Oxycodone.

## 2011-11-14 ENCOUNTER — Encounter (HOSPITAL_COMMUNITY): Payer: Self-pay

## 2011-11-16 ENCOUNTER — Encounter (HOSPITAL_COMMUNITY)
Admission: RE | Admit: 2011-11-16 | Discharge: 2011-11-16 | Disposition: A | Discharge status: Home / Self Care | Payer: Medicare Other | Source: Ambulatory Visit | Attending: Surgery | Admitting: Surgery

## 2011-11-16 ENCOUNTER — Encounter (HOSPITAL_COMMUNITY): Payer: Self-pay

## 2011-11-16 DIAGNOSIS — I10 Essential (primary) hypertension: Secondary | ICD-10-CM | POA: Insufficient documentation

## 2011-11-16 DIAGNOSIS — I7092 Chronic total occlusion of artery of the extremities: Secondary | ICD-10-CM | POA: Insufficient documentation

## 2011-11-16 DIAGNOSIS — R569 Unspecified convulsions: Secondary | ICD-10-CM | POA: Insufficient documentation

## 2011-11-16 DIAGNOSIS — F528 Other sexual dysfunction not due to a substance or known physiological condition: Secondary | ICD-10-CM | POA: Insufficient documentation

## 2011-11-16 DIAGNOSIS — I739 Peripheral vascular disease, unspecified: Secondary | ICD-10-CM | POA: Insufficient documentation

## 2011-11-16 DIAGNOSIS — M79609 Pain in unspecified limb: Secondary | ICD-10-CM | POA: Insufficient documentation

## 2011-11-16 DIAGNOSIS — F17200 Nicotine dependence, unspecified, uncomplicated: Secondary | ICD-10-CM | POA: Insufficient documentation

## 2011-11-16 DIAGNOSIS — Z01812 Encounter for preprocedural laboratory examination: Secondary | ICD-10-CM | POA: Insufficient documentation

## 2011-11-16 DIAGNOSIS — R609 Edema, unspecified: Secondary | ICD-10-CM | POA: Insufficient documentation

## 2011-11-16 HISTORY — DX: Unspecified osteoarthritis, unspecified site: M19.90

## 2011-11-16 HISTORY — DX: Essential (primary) hypertension: I10

## 2011-11-16 HISTORY — DX: Unspecified convulsions: R56.9

## 2011-11-16 HISTORY — DX: Peripheral vascular disease, unspecified: I73.9

## 2011-11-16 LAB — COMPREHENSIVE METABOLIC PANEL
AST: 40 U/L — ABNORMAL HIGH (ref 0–37)
Albumin: 3.8 g/dL (ref 3.5–5.2)
Alkaline Phosphatase: 117 U/L (ref 39–117)
BUN: 8 mg/dL (ref 6–23)
Chloride: 90 mEq/L — ABNORMAL LOW (ref 96–112)
Potassium: 4.1 mEq/L (ref 3.5–5.1)
Sodium: 126 mEq/L — ABNORMAL LOW (ref 135–145)
Total Bilirubin: 0.2 mg/dL — ABNORMAL LOW (ref 0.3–1.2)
Total Protein: 7.2 g/dL (ref 6.0–8.3)

## 2011-11-16 LAB — TYPE AND SCREEN: ABO/RH(D): B POS

## 2011-11-16 LAB — URINALYSIS, ROUTINE W REFLEX MICROSCOPIC
Glucose, UA: NEGATIVE mg/dL
Leukocytes, UA: NEGATIVE
Protein, ur: NEGATIVE mg/dL
Specific Gravity, Urine: 1.008 (ref 1.005–1.030)
Urobilinogen, UA: 1 mg/dL (ref 0.0–1.0)

## 2011-11-16 LAB — ABO/RH: ABO/RH(D): B POS

## 2011-11-16 LAB — CBC
MCHC: 37.1 g/dL — ABNORMAL HIGH (ref 30.0–36.0)
Platelets: 291 10*3/uL (ref 150–400)
RDW: 12.8 % (ref 11.5–15.5)
WBC: 9.2 10*3/uL (ref 4.0–10.5)

## 2011-11-16 LAB — SURGICAL PCR SCREEN: Staphylococcus aureus: NEGATIVE

## 2011-11-16 LAB — APTT: aPTT: 35 seconds (ref 24–37)

## 2011-11-16 LAB — PROTIME-INR
INR: 1.01 (ref 0.00–1.49)
Prothrombin Time: 13.2 seconds (ref 11.6–15.2)

## 2011-11-16 NOTE — Pre-Procedure Instructions (Signed)
20 MUSIQ VEST  11/16/2011   Your procedure is scheduled on:  11/23/2011  Report to [facility] at 5:30 AM.  Call this number if you have problems the morning of surgery: [phone]   Remember:   Do not eat food or drink liquids :After Midnight. On THURSDAY      Take these medicines the morning of surgery with A SIP OF WATER: Tegretol, Keppra, Neurontin, pain medicine    Do not wear jewelry   Do not wear lotions, powders, or perfumes. You may wear deodorant.              Men may shave face and neck.   Do not bring valuables to the hospital.   Contacts, dentures or bridgework may not be worn into surgery.   Leave suitcase in the car. After surgery it may be brought to your room.   For patients admitted to the hospital, checkout time is 11:00 AM the day of discharge.   Patients discharged the day of surgery will not be allowed to drive home.  Name and phone number of your driver: /w wife  Special Instructions: Shower using CHG 2 nights before surgery and the night before surgery.  If you shower the day of surgery use CHG.  Use special wash - you have one bottle of CHG for all showers.  You should use approximately 1/3 of the bottle for each shower.   Please read over the following fact sheets that you were given: Pain Booklet, Coughing and Deep Breathing, Blood Transfusion Information, MRSA Information and Surgical Site Infection Prevention

## 2011-11-19 ENCOUNTER — Telehealth: Payer: Self-pay

## 2011-11-19 DIAGNOSIS — I739 Peripheral vascular disease, unspecified: Secondary | ICD-10-CM

## 2011-11-19 MED ORDER — HYDROCODONE-ACETAMINOPHEN 5-500 MG PO TABS: ORAL_TABLET | ORAL | Status: DC

## 2011-11-19 NOTE — Telephone Encounter (Signed)
Dr. Myra Gianotti not available to sign the Oxycodone Rx.  Will call-in Hydrocodone/Acetaminophen 5/500 mg 1-2 tabs q 6 hrs prn,  # 10; no refills; per standing orders.

## 2011-11-19 NOTE — Telephone Encounter (Signed)
Pt. Called to request refill on his pain medication.  States pain level is 6/10.  States taking his pain medication every 4 hrs.  Per Dr. Myra Gianotti : okay to refill Oxycodone 5 mg 1-2 tabs po. q 4-6 hrs. Prn.; # 30, no refills.

## 2011-11-20 ENCOUNTER — Encounter (HOSPITAL_COMMUNITY): Payer: Self-pay | Admitting: Anesthesiology

## 2011-11-20 NOTE — Consult Note (Signed)
Anesthesiology:  Pre-op labs from 11/17/11 noted, Na 126, was 129 3 weeks ago. No diuretic listed on med list. Will recheck BMET on the day of surgery, if Na stable, should be OK to proceed.  Kipp Brood

## 2011-11-22 MED ORDER — VANCOMYCIN HCL IN DEXTROSE 1-5 GM/200ML-% IV SOLN
1000.0000 mg | INTRAVENOUS | Status: DC
  Filled 2011-11-22: qty 200

## 2011-11-23 ENCOUNTER — Telehealth: Payer: Self-pay

## 2011-11-23 ENCOUNTER — Encounter (HOSPITAL_COMMUNITY): Payer: Self-pay | Admitting: Anesthesiology

## 2011-11-23 ENCOUNTER — Encounter (HOSPITAL_COMMUNITY): Admission: RE | Payer: Self-pay | Source: Ambulatory Visit

## 2011-11-23 SURGERY — CREATION, BYPASS, ARTERIAL, AORTA TO FEMORAL, BILATERAL, USING GRAFT
Anesthesia: General | Site: Abdomen | Laterality: Bilateral

## 2011-11-23 MED ORDER — MANNITOL 25 % IV SOLN: 25.0000 g | INTRAVENOUS | Status: AC

## 2011-11-23 NOTE — Preoperative (Signed)
Beta Blockers   Reason not to administer Beta Blockers:Not Applicable 

## 2011-11-23 NOTE — Telephone Encounter (Signed)
Pt. Left message with the answering service last evening at 12:47 AM.  Nurse called pt. This AM to discuss reason he cancelled his surgery.  Stated he wanted to speak with his PCP about it.  Didn't give any other explanation as to why he hadn't called to cancel sooner.  Wife got on phone and stated pt. Doesn't seem to really understand enough about the surgery, and wants to discuss with his PCP.  Stated he would call back when ready to reschedule.

## 2011-11-24 ENCOUNTER — Emergency Department (HOSPITAL_COMMUNITY)
Admission: EM | Admit: 2011-11-24 | Discharge: 2011-11-24 | Disposition: A | Discharge status: Home / Self Care | Payer: Medicare Other | Attending: Emergency Medicine | Admitting: Emergency Medicine

## 2011-11-24 ENCOUNTER — Encounter (HOSPITAL_COMMUNITY): Payer: Self-pay | Admitting: Emergency Medicine

## 2011-11-24 ENCOUNTER — Inpatient Hospital Stay (HOSPITAL_COMMUNITY): Admission: RE | Admit: 2011-11-24 | Payer: Medicare Other | Source: Ambulatory Visit | Admitting: Surgery

## 2011-11-24 ENCOUNTER — Other Ambulatory Visit: Payer: Self-pay

## 2011-11-24 DIAGNOSIS — M79673 Pain in unspecified foot: Secondary | ICD-10-CM

## 2011-11-24 DIAGNOSIS — G40909 Epilepsy, unspecified, not intractable, without status epilepticus: Secondary | ICD-10-CM | POA: Insufficient documentation

## 2011-11-24 DIAGNOSIS — I724 Aneurysm of artery of lower extremity: Secondary | ICD-10-CM | POA: Insufficient documentation

## 2011-11-24 DIAGNOSIS — F17200 Nicotine dependence, unspecified, uncomplicated: Secondary | ICD-10-CM | POA: Insufficient documentation

## 2011-11-24 DIAGNOSIS — G479 Sleep disorder, unspecified: Secondary | ICD-10-CM | POA: Insufficient documentation

## 2011-11-24 DIAGNOSIS — Z8673 Personal history of transient ischemic attack (TIA), and cerebral infarction without residual deficits: Secondary | ICD-10-CM | POA: Insufficient documentation

## 2011-11-24 DIAGNOSIS — N189 Chronic kidney disease, unspecified: Secondary | ICD-10-CM | POA: Insufficient documentation

## 2011-11-24 DIAGNOSIS — M119 Crystal arthropathy, unspecified: Secondary | ICD-10-CM | POA: Insufficient documentation

## 2011-11-24 DIAGNOSIS — I129 Hypertensive chronic kidney disease with stage 1 through stage 4 chronic kidney disease, or unspecified chronic kidney disease: Secondary | ICD-10-CM | POA: Insufficient documentation

## 2011-11-24 DIAGNOSIS — Z79899 Other long term (current) drug therapy: Secondary | ICD-10-CM | POA: Insufficient documentation

## 2011-11-24 DIAGNOSIS — Z7982 Long term (current) use of aspirin: Secondary | ICD-10-CM | POA: Insufficient documentation

## 2011-11-24 HISTORY — DX: Aneurysm of artery of lower extremity: I72.4

## 2011-11-24 LAB — POCT I-STAT, CHEM 8
Calcium, Ion: 1.16 mmol/L (ref 1.13–1.30)
Creatinine, Ser: 0.8 mg/dL (ref 0.50–1.35)
Glucose, Bld: 98 mg/dL (ref 70–99)
Hemoglobin: 13.3 g/dL (ref 13.0–17.0)
Sodium: 132 mEq/L — ABNORMAL LOW (ref 135–145)
TCO2: 24 mmol/L (ref 0–100)

## 2011-11-24 MED ORDER — OXYCODONE-ACETAMINOPHEN 5-325 MG PO TABS: 1.0000 | ORAL_TABLET | ORAL | Status: DC | PRN

## 2011-11-24 MED ORDER — OXYCODONE-ACETAMINOPHEN 5-325 MG PO TABS
1.0000 | ORAL_TABLET | Freq: Once | ORAL | Status: AC
  Administered 2011-11-24: 1 via ORAL
  Filled 2011-11-24: qty 1

## 2011-11-24 NOTE — ED Provider Notes (Signed)
Medical screening examination/treatment/procedure(s) were conducted as a shared visit with non-physician practitioner(s) and myself.  I personally evaluated the patient during the encounter  Flint Melter, MD 11/24/11 2101

## 2011-11-24 NOTE — ED Notes (Signed)
Wife advises that patient was supposed to have surgery on 11/23/2011,for an aortic/femerol bypass. She states that the patient has a seizure disorder and had total of six to seven seizures over the last three day. Last seizure activity was yesterday in the morning. He did not remember the surgical procedure nor the conversation with the doctor.He presents to the ED today for evaluation to determine if he needs to continue with the surgical procedure.

## 2011-11-24 NOTE — ED Notes (Signed)
Patient is alert and orientedx4.  Patient was able to understand discharge instructions and had no questions.  Patient's wife is transporting patient home.

## 2011-11-24 NOTE — ED Provider Notes (Signed)
3:35 PM Patient is in CDU holding for tegretol level.  Sign out received from Remi Haggard, NP.  Pt with multiple seizures this week.  Plan is for level to come back, loading if needed.  Anticipate d/c home.    4:37 PM RN Kriste Basque has called lab and found that tegretol level does not result for 5 days.  Patient is eager to be discharged home.  No recurrence of seizures.  Pt does request percocet for his foot pain.  I have discussed this with Dr Effie Shy who agrees with discharge home and agrees with percocet for pain.  Pt to follow up with Dr Marjory Lies (neuro) and Dr Myra Gianotti (vasculary surgery).  Pt verbalizes understanding and agrees with plan.    Results for orders placed during the hospital encounter of 11/24/11  POCT I-STAT, CHEM 8      Component Value Range   Sodium 132 (*) 135 - 145 mEq/L   Potassium 3.5  3.5 - 5.1 mEq/L   Chloride 96  96 - 112 mEq/L   BUN 9  6 - 23 mg/dL   Creatinine, Ser 1.61  0.50 - 1.35 mg/dL   Glucose, Bld 98  70 - 99 mg/dL   Calcium, Ion 0.96  0.45 - 1.30 mmol/L   TCO2 24  0 - 100 mmol/L   Hemoglobin 13.3  13.0 - 17.0 g/dL   HCT 40.9  81.1 - 91.4 %   No results found.    [place], [place] 11/24/11 1642

## 2011-11-24 NOTE — ED Notes (Signed)
Patient advises that he took two pain pills PTA and denies pain at this time. Bilateral pedal pulses present.

## 2011-11-24 NOTE — ED Notes (Signed)
Family at bedside. Ready to be discharged.

## 2011-11-24 NOTE — ED Provider Notes (Signed)
1415 Report received 61 yo male with c/o seizure activity x 2 days intermittant and states that he was suppose to have surgery yesterday but the seizure activity made him forget.  Dr.Brabham was going to do vascular surgery to his lower extremity.  Bilateral LE warm to touch with normal color.  Awaiting a tegretol level to come back so we can dose him appropriately.   1530  Report given to Rae Halsted for Mr Bathgate awaiting the tegretol level.  No seizure activity in the ER.  Needs to call Dr. Myra Gianotti for follow up on his vascular surgery .  Remi Haggard, NP 11/25/11 1746
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
## 2011-11-24 NOTE — ED Notes (Signed)
Patient moving to CDU report given to [facility]

## 2011-11-27 ENCOUNTER — Other Ambulatory Visit: Payer: Self-pay

## 2011-11-27 DIAGNOSIS — M79609 Pain in unspecified limb: Secondary | ICD-10-CM

## 2011-11-27 MED ORDER — OXYCODONE-ACETAMINOPHEN 5-325 MG PO TABS: ORAL_TABLET | ORAL | Status: DC

## 2011-11-28 LAB — CARBAMAZEPINE, FREE AND TOTAL
Carbamazepine Metabolite -: 2.7 ug/mL — ABNORMAL HIGH (ref 0.2–2.0)
Carbamazepine, Free: 1.6 ug/mL (ref 1.0–3.0)
Carbamazepine, Total: 7.7 ug/mL (ref 4.0–12.0)

## 2011-11-28 NOTE — ED Provider Notes (Signed)
Medical screening examination/treatment/procedure(s) were performed by non-physician practitioner and as supervising physician I was immediately available for consultation/collaboration.  Shonte Soderlund L Arville Postlewaite, MD 11/28/11 2046 

## 2011-11-29 ENCOUNTER — Encounter (HOSPITAL_COMMUNITY): Payer: Self-pay | Admitting: Pharmacy Technician

## 2011-12-03 ENCOUNTER — Encounter (HOSPITAL_COMMUNITY): Payer: Self-pay

## 2011-12-03 ENCOUNTER — Other Ambulatory Visit: Payer: Self-pay

## 2011-12-03 ENCOUNTER — Encounter (HOSPITAL_COMMUNITY)
Admission: RE | Admit: 2011-12-03 | Discharge: 2011-12-03 | Disposition: A | Discharge status: Home / Self Care | Payer: Medicare Other | Source: Ambulatory Visit | Attending: Surgery | Admitting: Surgery

## 2011-12-03 HISTORY — DX: Chronic obstructive pulmonary disease, unspecified: J44.9

## 2011-12-03 LAB — BASIC METABOLIC PANEL
BUN: 10 mg/dL (ref 6–23)
Calcium: 9.5 mg/dL (ref 8.4–10.5)
GFR calc Af Amer: >90 mL/min (ref >90)
GFR calc non Af Amer: >90 mL/min (ref >90)
Glucose, Bld: 102 mg/dL — ABNORMAL HIGH (ref 70–99)
Sodium: 128 mEq/L — ABNORMAL LOW (ref 135–145)

## 2011-12-03 LAB — CBC
MCH: 34.4 pg — ABNORMAL HIGH (ref 26.0–34.0)
MCHC: 36.2 g/dL — ABNORMAL HIGH (ref 30.0–36.0)
Platelets: 300 10*3/uL (ref 150–400)
RBC: 4.59 MIL/uL (ref 4.22–5.81)

## 2011-12-03 LAB — PROTIME-INR: Prothrombin Time: 12.8 seconds (ref 11.6–15.2)

## 2011-12-03 NOTE — Progress Notes (Signed)
Call to [facility] at [facility], asked for orders for Dr. Myra Gianotti for this new date of surgery.

## 2011-12-03 NOTE — Progress Notes (Signed)
Dr. Loleta Chance,  Your pt.Lee [name] has screened at an elevated risk for obstructive sleep apnea using the STOP BANG tool during a presurgical visit. A score of 4 or greater is an elevated risk.

## 2011-12-03 NOTE — Progress Notes (Signed)
12/03/11 1518  OBSTRUCTIVE SLEEP APNEA  Have you ever been diagnosed with sleep apnea through a sleep study? No  Do you snore loudly (loud enough to be heard through closed doors)?  1  Do you often feel tired, fatigued, or sleepy during the daytime? 1  Has anyone observed you stop breathing during your sleep? 0  Do you have, or are you being treated for high blood pressure? 1  BMI more than 35 kg/m2? 0  Age over 62 years old? 1  Neck circumference greater than 40 cm/18 inches? 0  Gender: 1  Obstructive Sleep Apnea Score 5   Score 4 or greater  Results sent to PCP

## 2011-12-03 NOTE — Pre-Procedure Instructions (Signed)
20 KODIE KISHI  12/03/2011   Your procedure is scheduled on:  12/13/2011  Report to [facility] at 7:00 AM.  Call this number if you have problems the morning of surgery: [phone]   Remember:   Do not eat food or drink liquid:After Midnight. On WEDNESDAY    Take these medicines the morning of surgery with A SIP OF WATER: EPITOL, GABAPENTIN, KEPPRA, pain medicine is OK if needed    Do not wear jewelry  Do not wear lotions, powders, or perfumes. You may wear deodorant.   Men may shave face and neck.  Do not bring valuables to the hospital.  Contacts, dentures or bridgework may not be worn into surgery.  Leave suitcase in the car. After surgery it may be brought to your room.  For patients admitted to the hospital, checkout time is 11:00 AM the day of discharge.   Patients discharged the day of surgery will not be allowed to drive home.  Name and phone number of your driver: w/ WIFE  Special Instructions: Shower using CHG 2 nights before surgery and the night before surgery.  If you shower the day of surgery use CHG.  Use special wash - you have one bottle of CHG for all showers.  You should use approximately 1/3 of the bottle for each shower.   Please read over the following fact sheets that you were given: Pain Booklet, Coughing and Deep Breathing, Blood Transfusion Information, MRSA Information and Surgical Site Infection Prevention

## 2011-12-05 ENCOUNTER — Encounter: Payer: Self-pay | Admitting: Neurosurgery

## 2011-12-05 ENCOUNTER — Ambulatory Visit (INDEPENDENT_AMBULATORY_CARE_PROVIDER_SITE_OTHER): Payer: Medicare Other | Admitting: Neurosurgery

## 2011-12-05 VITALS — BP 139/79 | HR 78 | Ht 68.0 in | Wt 166.7 lb

## 2011-12-05 DIAGNOSIS — I739 Peripheral vascular disease, unspecified: Secondary | ICD-10-CM

## 2011-12-05 MED ORDER — OXYCODONE-ACETAMINOPHEN 5-325 MG PO TABS
1.0000 | ORAL_TABLET | Freq: Four times a day (QID) | ORAL | Status: DC | PRN
Start: 2011-12-05 — End: 2011-12-26

## 2011-12-05 NOTE — Progress Notes (Signed)
Subjective:     Patient ID: Lee [name], male   DOB: [birth year], 61 y.o.   MRN: [id number]  HPI: 61 year old male patient of Dr. Myra Gianotti who is scheduled for a aorta bifemoral bypass 12/13/2011. The patient states he was asked to come in for a "med refill", the patient states he still has lower extremity pain but it is no worse than it has been.   Review of Systems: 12 point review of systems is notable for the difficulties described above otherwise unremarkable     Objective:   Physical Exam: Afebrile, vital signs are stable, the patient has no open wounds on his lower extremities. He does not have palpable pulse in the lower extremities, and review of Dr. Estanislado Spire last note the patient appears to be stable.     Assessment:     Pending aortobifem bypass 12/13/2011    Plan:     The patient was given a prescription for Percocet 5/325 mg 1 by mouth every 6 hours when necessary pain 40 with no refill, his followup in the office will be pending his discharge after his procedure. The patient's questions were encouraged and answered, he is in agreement with this plan.  Lauree Chandler ANP  Clinic M.D.: Early on call

## 2011-12-12 MED ORDER — SODIUM CHLORIDE 0.9 % IV SOLN: INTRAVENOUS | Status: DC

## 2011-12-12 MED ORDER — VANCOMYCIN HCL IN DEXTROSE 1-5 GM/200ML-% IV SOLN
1000.0000 mg | INTRAVENOUS | Status: AC
  Administered 2011-12-13: 1000 mg via INTRAVENOUS
  Filled 2011-12-12: qty 200

## 2011-12-13 ENCOUNTER — Encounter (HOSPITAL_COMMUNITY): Payer: Self-pay | Admitting: Certified Registered"

## 2011-12-13 ENCOUNTER — Inpatient Hospital Stay (HOSPITAL_COMMUNITY): Payer: Medicare Other | Admitting: Certified Registered"

## 2011-12-13 ENCOUNTER — Inpatient Hospital Stay (HOSPITAL_COMMUNITY): Payer: Medicare Other

## 2011-12-13 ENCOUNTER — Encounter (HOSPITAL_COMMUNITY)
Admission: RE | Disposition: A | Discharge status: Home / Self Care | Payer: Self-pay | Source: Ambulatory Visit | Attending: Surgery

## 2011-12-13 ENCOUNTER — Inpatient Hospital Stay (HOSPITAL_COMMUNITY)
Admission: RE | Admit: 2011-12-13 | Discharge: 2011-12-19 | DRG: 238 | Disposition: A | Discharge status: Home / Self Care | Payer: Medicare Other | Source: Ambulatory Visit | Attending: Surgery | Admitting: Surgery

## 2011-12-13 ENCOUNTER — Encounter (HOSPITAL_COMMUNITY): Payer: Self-pay | Admitting: Surgery

## 2011-12-13 DIAGNOSIS — F17200 Nicotine dependence, unspecified, uncomplicated: Secondary | ICD-10-CM | POA: Diagnosis present

## 2011-12-13 DIAGNOSIS — I70229 Atherosclerosis of native arteries of extremities with rest pain, unspecified extremity: Principal | ICD-10-CM | POA: Diagnosis present

## 2011-12-13 DIAGNOSIS — N365 Urethral false passage: Secondary | ICD-10-CM | POA: Diagnosis present

## 2011-12-13 DIAGNOSIS — Z79899 Other long term (current) drug therapy: Secondary | ICD-10-CM

## 2011-12-13 DIAGNOSIS — I743 Embolism and thrombosis of arteries of the lower extremities: Secondary | ICD-10-CM

## 2011-12-13 DIAGNOSIS — I739 Peripheral vascular disease, unspecified: Secondary | ICD-10-CM

## 2011-12-13 DIAGNOSIS — Z8249 Family history of ischemic heart disease and other diseases of the circulatory system: Secondary | ICD-10-CM

## 2011-12-13 DIAGNOSIS — G40909 Epilepsy, unspecified, not intractable, without status epilepticus: Secondary | ICD-10-CM | POA: Diagnosis present

## 2011-12-13 DIAGNOSIS — Z823 Family history of stroke: Secondary | ICD-10-CM

## 2011-12-13 DIAGNOSIS — I10 Essential (primary) hypertension: Secondary | ICD-10-CM | POA: Diagnosis present

## 2011-12-13 DIAGNOSIS — Z8673 Personal history of transient ischemic attack (TIA), and cerebral infarction without residual deficits: Secondary | ICD-10-CM

## 2011-12-13 DIAGNOSIS — Z880 Allergy status to penicillin: Secondary | ICD-10-CM

## 2011-12-13 DIAGNOSIS — Z01812 Encounter for preprocedural laboratory examination: Secondary | ICD-10-CM

## 2011-12-13 DIAGNOSIS — K219 Gastro-esophageal reflux disease without esophagitis: Secondary | ICD-10-CM | POA: Diagnosis present

## 2011-12-13 DIAGNOSIS — M19079 Primary osteoarthritis, unspecified ankle and foot: Secondary | ICD-10-CM | POA: Diagnosis present

## 2011-12-13 DIAGNOSIS — J449 Chronic obstructive pulmonary disease, unspecified: Secondary | ICD-10-CM | POA: Diagnosis present

## 2011-12-13 DIAGNOSIS — J4489 Other specified chronic obstructive pulmonary disease: Secondary | ICD-10-CM | POA: Diagnosis present

## 2011-12-13 DIAGNOSIS — Z7982 Long term (current) use of aspirin: Secondary | ICD-10-CM

## 2011-12-13 HISTORY — PX: ENDARTERECTOMY FEMORAL: SHX5804

## 2011-12-13 HISTORY — PX: CYSTOSCOPY: SHX5120

## 2011-12-13 HISTORY — PX: AORTA - BILATERAL FEMORAL ARTERY BYPASS GRAFT: SHX1175

## 2011-12-13 LAB — BASIC METABOLIC PANEL
BUN: 6 mg/dL (ref 6–23)
CO2: 25 mEq/L (ref 19–32)
Chloride: 96 mEq/L (ref 96–112)
Creatinine, Ser: 0.59 mg/dL (ref 0.50–1.35)
Glucose, Bld: 134 mg/dL — ABNORMAL HIGH (ref 70–99)

## 2011-12-13 LAB — COMPREHENSIVE METABOLIC PANEL
ALT: 28 U/L (ref 0–53)
AST: 36 U/L (ref 0–37)
Albumin: 3.6 g/dL (ref 3.5–5.2)
CO2: 20 mEq/L (ref 19–32)
Chloride: 93 mEq/L — ABNORMAL LOW (ref 96–112)
Creatinine, Ser: 0.64 mg/dL (ref 0.50–1.35)
GFR calc non Af Amer: >90 mL/min (ref >90)
Potassium: 4.6 mEq/L (ref 3.5–5.1)
Sodium: 128 mEq/L — ABNORMAL LOW (ref 135–145)
Total Bilirubin: 0.3 mg/dL (ref 0.3–1.2)

## 2011-12-13 LAB — BLOOD GAS, ARTERIAL
Drawn by: 317771
O2 Content: 2 L/min
TCO2: 25.6 mmol/L (ref 0–100)
pCO2 arterial: 46.6 mmHg — ABNORMAL HIGH (ref 35.0–45.0)
pH, Arterial: 7.336 — ABNORMAL LOW (ref 7.350–7.450)
pO2, Arterial: 80.6 mmHg (ref 80.0–100.0)

## 2011-12-13 LAB — GLUCOSE, CAPILLARY: Glucose-Capillary: 122 mg/dL — ABNORMAL HIGH (ref 70–99)

## 2011-12-13 LAB — CBC
HCT: 36.6 % — ABNORMAL LOW (ref 39.0–52.0)
Hemoglobin: 13.1 g/dL (ref 13.0–17.0)
MCV: 94.3 fL (ref 78.0–100.0)
RDW: 13.4 % (ref 11.5–15.5)
WBC: 22.4 10*3/uL — ABNORMAL HIGH (ref 4.0–10.5)

## 2011-12-13 SURGERY — CREATION, BYPASS, ARTERIAL, AORTA TO FEMORAL, BILATERAL, USING GRAFT
Anesthesia: General | Site: Bladder | Laterality: Right | Wound class: Clean

## 2011-12-13 MED ORDER — POTASSIUM CHLORIDE CRYS ER 20 MEQ PO TBCR: 20.0000 meq | EXTENDED_RELEASE_TABLET | Freq: Once | ORAL | Status: AC | PRN

## 2011-12-13 MED ORDER — FLEET ENEMA 7-19 GM/118ML RE ENEM: 1.0000 | ENEMA | Freq: Once | RECTAL | Status: AC | PRN

## 2011-12-13 MED ORDER — HYDROMORPHONE HCL PF 1 MG/ML IJ SOLN
INTRAMUSCULAR | Status: DC | PRN
  Administered 2011-12-13 (×2): 0.5 mg via INTRAVENOUS

## 2011-12-13 MED ORDER — MORPHINE SULFATE 2 MG/ML IJ SOLN
INTRAMUSCULAR | Status: AC
  Administered 2011-12-13: 2 mg via INTRAVENOUS
  Filled 2011-12-13: qty 1

## 2011-12-13 MED ORDER — 0.9 % SODIUM CHLORIDE (POUR BTL) OPTIME
TOPICAL | Status: DC | PRN
  Administered 2011-12-13: 2000 mL

## 2011-12-13 MED ORDER — SODIUM CHLORIDE 0.9 % IV SOLN
10.0000 mg | INTRAVENOUS | Status: DC | PRN
  Administered 2011-12-13: 10 ug/min via INTRAVENOUS

## 2011-12-13 MED ORDER — GLYCOPYRROLATE 0.2 MG/ML IJ SOLN
INTRAMUSCULAR | Status: DC | PRN
  Administered 2011-12-13: 0.6 mg via INTRAVENOUS

## 2011-12-13 MED ORDER — METOPROLOL TARTRATE 1 MG/ML IV SOLN: 2.0000 mg | INTRAVENOUS | Status: DC | PRN

## 2011-12-13 MED ORDER — KCL IN DEXTROSE-NACL 20-5-0.45 MEQ/L-%-% IV SOLN
INTRAVENOUS | Status: DC
  Administered 2011-12-13: 125 mL/h via INTRAVENOUS
  Administered 2011-12-14 (×2): via INTRAVENOUS
  Administered 2011-12-15: 125 mL/h via INTRAVENOUS
  Filled 2011-12-13 (×10): qty 1000

## 2011-12-13 MED ORDER — ALUM & MAG HYDROXIDE-SIMETH 200-200-20 MG/5ML PO SUSP: 15.0000 mL | ORAL | Status: DC | PRN

## 2011-12-13 MED ORDER — MORPHINE SULFATE 4 MG/ML IJ SOLN
INTRAMUSCULAR | Status: AC
  Administered 2011-12-13: 4 mg
  Filled 2011-12-13: qty 1

## 2011-12-13 MED ORDER — HYDRALAZINE HCL 20 MG/ML IJ SOLN
10.0000 mg | INTRAMUSCULAR | Status: DC | PRN
  Administered 2011-12-13: 10 mg via INTRAVENOUS
  Filled 2011-12-13: qty 0.5

## 2011-12-13 MED ORDER — PANTOPRAZOLE SODIUM 40 MG PO TBEC: 40.0000 mg | DELAYED_RELEASE_TABLET | Freq: Every day | ORAL | Status: DC

## 2011-12-13 MED ORDER — VANCOMYCIN HCL IN DEXTROSE 1-5 GM/200ML-% IV SOLN
1000.0000 mg | Freq: Once | INTRAVENOUS | Status: AC
  Administered 2011-12-13: 1000 mg via INTRAVENOUS
  Filled 2011-12-13: qty 200

## 2011-12-13 MED ORDER — PROPOFOL 10 MG/ML IV BOLUS
INTRAVENOUS | Status: DC | PRN
  Administered 2011-12-13: 200 mg via INTRAVENOUS

## 2011-12-13 MED ORDER — OXYCODONE-ACETAMINOPHEN 5-325 MG PO TABS
1.0000 | ORAL_TABLET | Freq: Four times a day (QID) | ORAL | Status: DC | PRN
  Administered 2011-12-15 – 2011-12-16 (×2): 1 via ORAL
  Filled 2011-12-13 (×2): qty 1

## 2011-12-13 MED ORDER — MIDAZOLAM HCL 5 MG/5ML IJ SOLN
INTRAMUSCULAR | Status: DC | PRN
  Administered 2011-12-13: 2 mg via INTRAVENOUS

## 2011-12-13 MED ORDER — SODIUM CHLORIDE 0.9 % IV SOLN: 500.0000 mL | Freq: Once | INTRAVENOUS | Status: AC | PRN

## 2011-12-13 MED ORDER — ARTIFICIAL TEARS OP OINT
TOPICAL_OINTMENT | OPHTHALMIC | Status: DC | PRN
  Administered 2011-12-13: 1 via OPHTHALMIC

## 2011-12-13 MED ORDER — HYDROMORPHONE HCL PF 1 MG/ML IJ SOLN
INTRAMUSCULAR | Status: AC
  Filled 2011-12-13: qty 2

## 2011-12-13 MED ORDER — GABAPENTIN 400 MG PO CAPS
400.0000 mg | ORAL_CAPSULE | Freq: Three times a day (TID) | ORAL | Status: DC
  Administered 2011-12-13 – 2011-12-14 (×3): 400 mg via ORAL
  Filled 2011-12-13 (×4): qty 1

## 2011-12-13 MED ORDER — STERILE WATER FOR IRRIGATION IR SOLN
Status: DC | PRN
  Administered 2011-12-13: 1000 mL

## 2011-12-13 MED ORDER — ONDANSETRON HCL 4 MG/2ML IJ SOLN: 4.0000 mg | Freq: Four times a day (QID) | INTRAMUSCULAR | Status: DC | PRN

## 2011-12-13 MED ORDER — PROTAMINE SULFATE 10 MG/ML IV SOLN
INTRAVENOUS | Status: DC | PRN
  Administered 2011-12-13 (×6): 10 mg via INTRAVENOUS
  Administered 2011-12-13: 15 mg via INTRAVENOUS

## 2011-12-13 MED ORDER — ONDANSETRON HCL 4 MG/2ML IJ SOLN
INTRAMUSCULAR | Status: DC | PRN
  Administered 2011-12-13: 4 mg via INTRAVENOUS

## 2011-12-13 MED ORDER — LABETALOL HCL 5 MG/ML IV SOLN
INTRAVENOUS | Status: DC | PRN
  Administered 2011-12-13: 5 mg via INTRAVENOUS

## 2011-12-13 MED ORDER — VECURONIUM BROMIDE 10 MG IV SOLR
INTRAVENOUS | Status: DC | PRN
  Administered 2011-12-13 (×2): 3 mg via INTRAVENOUS
  Administered 2011-12-13 (×4): 2 mg via INTRAVENOUS
  Administered 2011-12-13: 3 mg via INTRAVENOUS
  Administered 2011-12-13: 4 mg via INTRAVENOUS

## 2011-12-13 MED ORDER — ROCURONIUM BROMIDE 100 MG/10ML IV SOLN
INTRAVENOUS | Status: DC | PRN
  Administered 2011-12-13: 30 mg via INTRAVENOUS
  Administered 2011-12-13: 20 mg via INTRAVENOUS
  Administered 2011-12-13: 50 mg via INTRAVENOUS

## 2011-12-13 MED ORDER — HEMOSTATIC AGENTS (NO CHARGE) OPTIME
TOPICAL | Status: DC | PRN
  Administered 2011-12-13 (×3): 1 via TOPICAL

## 2011-12-13 MED ORDER — DOPAMINE-DEXTROSE 3.2-5 MG/ML-% IV SOLN: 3.0000 ug/kg/min | INTRAVENOUS | Status: DC

## 2011-12-13 MED ORDER — HEPARIN SODIUM (PORCINE) 1000 UNIT/ML IJ SOLN
INTRAMUSCULAR | Status: DC | PRN
  Administered 2011-12-13: 1500 [IU] via INTRAVENOUS
  Administered 2011-12-13: 1000 [IU] via INTRAVENOUS
  Administered 2011-12-13: 8000 [IU] via INTRAVENOUS

## 2011-12-13 MED ORDER — PANTOPRAZOLE SODIUM 40 MG PO PACK
40.0000 mg | PACK | Freq: Every day | ORAL | Status: DC
  Administered 2011-12-13 – 2011-12-15 (×3): 40 mg via ORAL
  Filled 2011-12-13 (×3): qty 20

## 2011-12-13 MED ORDER — LEVETIRACETAM 500 MG PO TABS
500.0000 mg | ORAL_TABLET | Freq: Two times a day (BID) | ORAL | Status: DC
  Administered 2011-12-13 – 2011-12-14 (×2): 500 mg via ORAL
  Filled 2011-12-13 (×3): qty 1

## 2011-12-13 MED ORDER — MORPHINE SULFATE 2 MG/ML IJ SOLN
2.0000 mg | INTRAMUSCULAR | Status: DC | PRN
  Administered 2011-12-13 (×2): 4 mg via INTRAVENOUS
  Administered 2011-12-13: 2 mg via INTRAVENOUS
  Administered 2011-12-14 (×3): 4 mg via INTRAVENOUS
  Administered 2011-12-14: 2 mg via INTRAVENOUS
  Administered 2011-12-14 – 2011-12-15 (×5): 4 mg via INTRAVENOUS
  Administered 2011-12-15 – 2011-12-17 (×6): 2 mg via INTRAVENOUS
  Administered 2011-12-17 (×2): 4 mg via INTRAVENOUS
  Filled 2011-12-13 (×2): qty 2
  Filled 2011-12-13 (×2): qty 1
  Filled 2011-12-13 (×5): qty 2
  Filled 2011-12-13 (×2): qty 1
  Filled 2011-12-13: qty 2
  Filled 2011-12-13: qty 1
  Filled 2011-12-13 (×2): qty 2
  Filled 2011-12-13: qty 1
  Filled 2011-12-13: qty 2
  Filled 2011-12-13: qty 1

## 2011-12-13 MED ORDER — LISINOPRIL 20 MG PO TABS
20.0000 mg | ORAL_TABLET | Freq: Every day | ORAL | Status: DC
  Administered 2011-12-14 – 2011-12-19 (×6): 20 mg via ORAL
  Filled 2011-12-13 (×8): qty 1

## 2011-12-13 MED ORDER — FENTANYL CITRATE 0.05 MG/ML IJ SOLN
INTRAMUSCULAR | Status: DC | PRN
  Administered 2011-12-13: 100 ug via INTRAVENOUS
  Administered 2011-12-13: 150 ug via INTRAVENOUS
  Administered 2011-12-13: 100 ug via INTRAVENOUS
  Administered 2011-12-13 (×2): 50 ug via INTRAVENOUS
  Administered 2011-12-13: 100 ug via INTRAVENOUS
  Administered 2011-12-13 (×2): 50 ug via INTRAVENOUS
  Administered 2011-12-13 (×2): 100 ug via INTRAVENOUS
  Administered 2011-12-13 (×3): 50 ug via INTRAVENOUS

## 2011-12-13 MED ORDER — SODIUM CHLORIDE 0.9 % IV SOLN
INTRAVENOUS | Status: DC | PRN
  Administered 2011-12-13: 15:00:00 via INTRAVENOUS

## 2011-12-13 MED ORDER — BISACODYL 10 MG RE SUPP: 10.0000 mg | Freq: Every day | RECTAL | Status: DC | PRN

## 2011-12-13 MED ORDER — HYDRALAZINE HCL 20 MG/ML IJ SOLN
INTRAMUSCULAR | Status: AC
  Filled 2011-12-13: qty 1

## 2011-12-13 MED ORDER — LIDOCAINE HCL 2 % EX GEL
CUTANEOUS | Status: DC | PRN
  Administered 2011-12-13: 1 via URETHRAL

## 2011-12-13 MED ORDER — LABETALOL HCL 5 MG/ML IV SOLN
10.0000 mg | INTRAVENOUS | Status: DC | PRN
  Administered 2011-12-14: 10 mg via INTRAVENOUS
  Filled 2011-12-13 (×2): qty 4

## 2011-12-13 MED ORDER — PHENOL 1.4 % MT LIQD: 1.0000 | OROMUCOSAL | Status: DC | PRN

## 2011-12-13 MED ORDER — GUAIFENESIN-DM 100-10 MG/5ML PO SYRP: 15.0000 mL | ORAL_SOLUTION | ORAL | Status: DC | PRN

## 2011-12-13 MED ORDER — SODIUM CHLORIDE 0.9 % IR SOLN
Status: DC | PRN
  Administered 2011-12-13: 15:00:00

## 2011-12-13 MED ORDER — CARBAMAZEPINE 200 MG PO TABS
200.0000 mg | ORAL_TABLET | Freq: Three times a day (TID) | ORAL | Status: DC
  Administered 2011-12-13 – 2011-12-14 (×3): 200 mg via ORAL
  Filled 2011-12-13 (×4): qty 1

## 2011-12-13 MED ORDER — HYDROMORPHONE HCL PF 1 MG/ML IJ SOLN
0.2500 mg | INTRAMUSCULAR | Status: DC | PRN
  Administered 2011-12-13 (×2): 1 mg via INTRAVENOUS

## 2011-12-13 MED ORDER — NEOSTIGMINE METHYLSULFATE 1 MG/ML IJ SOLN
INTRAMUSCULAR | Status: DC | PRN
  Administered 2011-12-13: 4 mg via INTRAVENOUS

## 2011-12-13 MED ORDER — LACTATED RINGERS IV SOLN
INTRAVENOUS | Status: DC
  Administered 2011-12-13 (×3): via INTRAVENOUS

## 2011-12-13 MED ORDER — MAGNESIUM SULFATE 40 MG/ML IJ SOLN
2.0000 g | Freq: Once | INTRAMUSCULAR | Status: AC | PRN
  Administered 2011-12-13: 2 g via INTRAVENOUS
  Filled 2011-12-13: qty 50

## 2011-12-13 SURGICAL SUPPLY — 82 items
ADH SKN CLS APL DERMABOND .7 (GAUZE/BANDAGES/DRESSINGS) ×3
CANISTER SUCTION 2500CC (MISCELLANEOUS) ×4 IMPLANT
CATH COUDE 5CC RIBBED (CATHETERS) IMPLANT
CATH FOLEY 2W COUNCIL 5CC 18FR (CATHETERS) ×1 IMPLANT
CATH RIBBED COUDE 5CC (CATHETERS) ×4
CATH TIEMANN FOLEY 18FR 5CC (CATHETERS) ×1 IMPLANT
CLIP TI MEDIUM 24 (CLIP) ×4 IMPLANT
CLIP TI WIDE RED SMALL 24 (CLIP) ×4 IMPLANT
CLOTH BEACON ORANGE TIMEOUT ST (SAFETY) ×4 IMPLANT
COVER MAYO STAND STRL (DRAPES) ×4 IMPLANT
COVER SURGICAL LIGHT HANDLE (MISCELLANEOUS) ×4 IMPLANT
DERMABOND ADVANCED (GAUZE/BANDAGES/DRESSINGS) ×1
DERMABOND ADVANCED .7 DNX12 (GAUZE/BANDAGES/DRESSINGS) ×6 IMPLANT
DRAPE PROXIMA HALF (DRAPES) ×1 IMPLANT
DRAPE WARM FLUID 44X44 (DRAPE) ×4 IMPLANT
ELECT BLADE 4.0 EZ CLEAN MEGAD (MISCELLANEOUS) ×4
ELECT BLADE 6.5 EXT (BLADE) IMPLANT
ELECT CAUTERY BLADE 6.4 (BLADE) ×1 IMPLANT
ELECT REM PT RETURN 9FT ADLT (ELECTROSURGICAL) ×4
ELECTRODE BLDE 4.0 EZ CLN MEGD (MISCELLANEOUS) ×3 IMPLANT
ELECTRODE REM PT RTRN 9FT ADLT (ELECTROSURGICAL) ×3 IMPLANT
GLIDEWIRE ST .035 (WIRE) ×1 IMPLANT
GLOVE BIO SURGEON STRL SZ 6.5 (GLOVE) ×2 IMPLANT
GLOVE BIO SURGEON STRL SZ7.5 (GLOVE) ×1 IMPLANT
GLOVE BIOGEL PI IND STRL 6.5 (GLOVE) IMPLANT
GLOVE BIOGEL PI IND STRL 7.0 (GLOVE) IMPLANT
GLOVE BIOGEL PI IND STRL 7.5 (GLOVE) ×3 IMPLANT
GLOVE BIOGEL PI IND STRL 8 (GLOVE) IMPLANT
GLOVE BIOGEL PI INDICATOR 6.5 (GLOVE) ×4
GLOVE BIOGEL PI INDICATOR 7.0 (GLOVE) ×1
GLOVE BIOGEL PI INDICATOR 7.5 (GLOVE) ×4
GLOVE BIOGEL PI INDICATOR 8 (GLOVE) ×1
GLOVE SS BIOGEL STRL SZ 7 (GLOVE) IMPLANT
GLOVE SUPERSENSE BIOGEL SZ 7 (GLOVE) ×2
GLOVE SURG SS PI 7.5 STRL IVOR (GLOVE) ×4 IMPLANT
GOWN PREVENTION PLUS XXLARGE (GOWN DISPOSABLE) ×5 IMPLANT
GOWN STRL NON-REIN LRG LVL3 (GOWN DISPOSABLE) ×18 IMPLANT
GRAFT HEMASHIELD 14X7MM (Vascular Products) ×1 IMPLANT
GUIDEWIRE ANG ZIPWIRE 038X150 (WIRE) ×1 IMPLANT
HEMOSTAT SNOW SURGICEL 2X4 (HEMOSTASIS) ×3 IMPLANT
HEMOSTAT SURGICEL 2X14 (HEMOSTASIS) IMPLANT
INSERT FOGARTY 61MM (MISCELLANEOUS) ×4 IMPLANT
INSERT FOGARTY SM (MISCELLANEOUS) ×8 IMPLANT
KIT BASIN OR (CUSTOM PROCEDURE TRAY) ×4 IMPLANT
KIT ROOM TURNOVER OR (KITS) ×4 IMPLANT
NS IRRIG 1000ML POUR BTL (IV SOLUTION) ×8 IMPLANT
PACK AORTA (CUSTOM PROCEDURE TRAY) ×4 IMPLANT
PAD ARMBOARD 7.5X6 YLW CONV (MISCELLANEOUS) ×8 IMPLANT
PENCIL BUTTON HOLSTER BLD 10FT (ELECTRODE) ×1 IMPLANT
PUNCH AORTIC ROTATE 5MM 8IN (MISCELLANEOUS) ×1 IMPLANT
SET IRRIG Y TYPE TUR BLADDER L (SET/KITS/TRAYS/PACK) ×1 IMPLANT
SPECIMEN JAR MEDIUM (MISCELLANEOUS) ×4 IMPLANT
SPONGE GAUZE 4X4 12PLY (GAUZE/BANDAGES/DRESSINGS) ×1 IMPLANT
SPONGE LAP 18X18 X RAY DECT (DISPOSABLE) ×1 IMPLANT
SUT ETHIBOND 5 LR DA (SUTURE) IMPLANT
SUT PDS AB 1 TP1 54 (SUTURE) ×9 IMPLANT
SUT PDS AB 1 TP1 96 (SUTURE) ×1 IMPLANT
SUT PROLENE 3 0 SH 48 (SUTURE) ×17 IMPLANT
SUT PROLENE 5 0 C 1 24 (SUTURE) ×5 IMPLANT
SUT PROLENE 5 0 C 1 36 (SUTURE) ×1 IMPLANT
SUT PROLENE 7 0 BV 1 (SUTURE) ×1 IMPLANT
SUT SILK 2 0 TIES 17X18 (SUTURE) ×4
SUT SILK 2 0SH CR/8 30 (SUTURE) ×4 IMPLANT
SUT SILK 2-0 18XBRD TIE BLK (SUTURE) ×3 IMPLANT
SUT SILK 3 0 (SUTURE) ×8
SUT SILK 3 0 TIES 17X18 (SUTURE) ×4
SUT SILK 3-0 18XBRD TIE 12 (SUTURE) IMPLANT
SUT SILK 3-0 18XBRD TIE BLK (SUTURE) ×3 IMPLANT
SUT VIC AB 2-0 CT1 27 (SUTURE) ×8
SUT VIC AB 2-0 CT1 36 (SUTURE) ×16 IMPLANT
SUT VIC AB 2-0 CT1 TAPERPNT 27 (SUTURE) IMPLANT
SUT VIC AB 2-0 CTB1 (SUTURE) ×1 IMPLANT
SUT VIC AB 3-0 SH 27 (SUTURE) ×16
SUT VIC AB 3-0 SH 27X BRD (SUTURE) ×12 IMPLANT
SUT VICRYL 4-0 PS2 18IN ABS (SUTURE) ×16 IMPLANT
SYRINGE 10CC LL (SYRINGE) ×1 IMPLANT
TOWEL BLUE STERILE X RAY DET (MISCELLANEOUS) ×8 IMPLANT
TOWEL OR 17X24 6PK STRL BLUE (TOWEL DISPOSABLE) ×8 IMPLANT
TOWEL OR 17X26 10 PK STRL BLUE (TOWEL DISPOSABLE) ×8 IMPLANT
TRAY FOLEY CATH 14FRSI W/METER (CATHETERS) ×4 IMPLANT
WATER STERILE IRR 1000ML POUR (IV SOLUTION) ×8 IMPLANT
WATER STERILE IRR 1000ML UROMA (IV SOLUTION) ×1 IMPLANT

## 2011-12-13 NOTE — OR Nursing (Signed)
Pt removed NGT from r nares in one fell attempt/ dr Imogene Burn notified and requested be replaced and verify with KUB. 46fr NGT per r nare placed x 1 attempt with no difficulty/verified by auscultation with 2nd RN/ Herr[place]

## 2011-12-13 NOTE — OR Nursing (Addendum)
Flexible Cystoscopy time 1103-1130 Aorta Bifemoral bypass graft start time 1150-

## 2011-12-13 NOTE — Anesthesia Preprocedure Evaluation (Addendum)
Anesthesia Evaluation  Patient identified by MRN, date of birth, ID band Patient awake    Reviewed: Allergy & Precautions, H&P , NPO status , Patient's Chart, lab work & pertinent test results  Airway Mallampati: I TM Distance: >3 FB Neck ROM: Full    Dental  (+) Edentulous Upper, Partial Lower and Dental Advisory Given   Pulmonary COPD breath sounds clear to auscultation        Cardiovascular hypertension, Pt. on medications + Peripheral Vascular Disease Rhythm:Regular Rate:Normal     Neuro/Psych Seizures -,  PSYCHIATRIC DISORDERS CVA, No Residual Symptoms    GI/Hepatic GERD-  ,  Endo/Other    Renal/GU      Musculoskeletal  (+) Arthritis -,   Abdominal   Peds  Hematology   Anesthesia Other Findings   Reproductive/Obstetrics                          Anesthesia Physical Anesthesia Plan  ASA: III  Anesthesia Plan: General   Post-op Pain Management:    Induction: Intravenous  Airway Management Planned: Oral ETT  Additional Equipment: Arterial line and CVP  Intra-op Plan:   Post-operative Plan: Extubation in OR  Informed Consent: I have reviewed the patients History and Physical, chart, labs and discussed the procedure including the risks, benefits and alternatives for the proposed anesthesia with the patient or authorized representative who has indicated his/her understanding and acceptance.     Plan Discussed with: CRNA and Surgeon  Anesthesia Plan Comments:         Anesthesia Quick Evaluation

## 2011-12-13 NOTE — Progress Notes (Signed)
Mr Harlin is a 61 year old male s/p aortobifemoral bypass, right common femoral endarterectomy, and reimplantation of the inferior mesenteric artery to receive 1 dose of vanc per pharmacy 12h post surgery dose. He has good renal function with an estimated crcl of ~12ml/min. Noted 1g vancomycin given at 1055am today.  Plan Vancomycin 1000mg  IV at 2300 tonight  Pharmacy will sign off this protocol  Thank you,  Brett Fairy, PharmD, BCPS 12/13/2011 7:37 PM

## 2011-12-13 NOTE — Op Note (Signed)
[facility] [place]  Patient name: Lee [name] MRN: [id number] DOB: [birth year] Sex: male  12/13/2011 Pre-operative Diagnosis: Bilateral rest pain Post-operative diagnosis:  Same Surgeon:  Jorge Ny Assistants: Cari Caraway Procedure:    #1: Aortobifemoral bypass using a 14 x 7 dacryon graft   #2: Right common femoral endarterectomy   #3: Reimplantation of the inferior mesenteric artery Anesthesia:  Gen. Blood Loss:  See anesthesia record Specimens:  Aortic and femoral plaque  Findings:  End to end aortic anastomosis with a 14 x 7 dacryon graft, and and right common femoral and profunda femoral endarterectomy. The right leg anastomosis was to the distal common femoral artery and then going approximately 2-1/2 cm out onto the profunda femoral. The left leg anastomosis was to the profundofemoral artery, which was medial. The inferior mesenteric artery was re\re and planted into the tube portion of the bifurcated graft.  Indications:  This is a 61 year old gentleman with severe peripheral vascular disease. He is taking narcotics for the pain that he experiences constantly. He recently underwent angiography which revealed extensive multilevel disease. He comes in today for aortobifemoral bypass graft. An  Procedure:  The patient was identified in the holding area and taken to [facility] OR ROOM 16  The patient was then placed supine on the table. general anesthesia was administered.  The patient was prepped and draped in the usual sterile fashion.  A time out was called and antibiotics were administered.  And longitudinal incisions were made in each groin. The common femoral and profunda femoral and superficial femoral arteries were exposed bilaterally. On the right, there was a soft area in the distal common femoral artery however there was very calcified circumferential plaque from the distal common femoral artery down to 2 cm onto the profunda femoral artery. There  was plaque throughout the profunda femoral artery. Crossing vein on the right was ligated between 3-0 silk ties. On the left, the common femoral artery was occluded. The profunda femoral artery in the groin was medial to the superficial femoral artery. It was also calcified but had areas where the vessel was soft. Superficial femoral arteries were occluded bilaterally. Once adequate femoral exposure was obtained, and the incisions were packed with wet laps. Attention was then turned towards the abdomen. A midline incision was made from the xiphoid down to below the umbilicus. The midline fascia was opened with cautery. The peritoneal cavity was then entered sharply. The incision was then opened throughout its length with cautery. The abdomen was then inspected. There were no gross pathology identified. Next, the transverse colon was reflected cephalad. The ligament of Treitz was taken down with cautery. A Balfour was then placed, followed by a Omni tract retractor. The retroperitoneal tissue was divided, exposing the infrarenal abdominal aorta. I did divide the inferior mesenteric vein. The renal vein was easily identified. The aorta was circumferentially exposed just below the right renal artery. I then isolated out the inferior mesenteric artery, and encircled it with a red vessel loop. The aortic bifurcation was identified. I then created a tunnel into each groin. Umbilical tapes were passed. At this point, the patient was fully heparinized. After the heparin had circulated, a Harken clamp was used to occlude the infrarenal abdominal aorta. An angled DeBakey clamp was used to occlude the distal aorta, below the inferior mesenteric artery. I then transected the aorta proximal to the inferior mesenteric artery. An endarterectomy was performed of the cut edges of the aorta. The distal end  was oversewn with 2 layers of 3-0 Prolene, incorporating a felt strip. Next, I selected a 14 x 7 bifurcated dacryon graft. An and  2 and anastomosis was created with the infrarenal aorta. This was done by placing 3 horizontal mattress interrupted back wall sutures and incorporating a felt strip. When the anastomosis was tested, it was hemostatic. Next, each limb of the graft was brought into the appropriate groin. Dr. Edilia Bo performed the anastomosis in the left groin. This was done to the profunda femoral artery. An end-to side anastomosis was constructed with 5-0 Prolene. On the right, after the artery was occluded with vascular clamps, and up with an 11 blade the profunda into the common femoral artery. I did an extensive endarterectomy of the distal common femoral artery and profunda femoral artery. Once I was satisfied with the endarterectomy, I placed 3 tacking 7-0 Prolene sutures at the distal flap within the profunda femoral artery. I then cut the dacryon graft to fit the size of the arteriotomy. A running anastomosis was created with 5-0 Prolene. After the appropriate flushing maneuvers were performed, blood flow was reestablished to the right leg after completing the anastomosis. There was excellent Doppler dependent blood flow and bilateral profunda femoral arteries.  I then turned my attention back to the abdomen. I felt that based on his disease that I needed to reimplant his inferior mesenteric artery. A Satinsky clamp was used to occlude the tube portion of the graft. The inferior mesenteric artery was ligated at its origin and then spatulated. I used a 5 mm punch to create a graftotomy on the left anterior lateral side of the graft. An end-to-side anastomosis was created with 6-0 Prolene. Blood flow was then reestablished to the inferior mesenteric artery. It had triphasic flow. At this point the patient's heparin was reversed with 75 mg of protamine. Once I was satisfied with hemostasis, the retroperitoneal tissue was closed over the graft with running 2-0 Vicryl. The abdominal contents were placed back into their anatomic  position. The bowel was run and looked healthy throughout it's length. The fascia was closed with 2 running #1 PDS suture. The subcutaneous tissue was closed with 2-0 Vicryl and the skin was closed with 4-0 Vicryl. The groins were closed by reapproximating the femoral sheath with 2-0 Vicryl. Each groin was closed with an additional layer of 20 and 3-0 Vicryl. 4-0 Vicryl was placed on the skin.  The patient had a monophasic posterior tibial and anterior tibial signals on the left. On the right he had a popliteal signal and a very faint anterior tibial signal. These signals were appropriate given what his infrainguinal disease looks like. He was then extubated and taken to the recovery room in stable condition.   Disposition:  To PACU in stable condition.   Juleen China, M.D. [facility] [place] Office: [phone] Pager:  [phone]

## 2011-12-13 NOTE — H&P (Signed)
[facility] [place]  Patient name: Lee [name] MRN: [id number] DOB: 01-Dec-1950 Sex: male  Referred by: Dr. Kirke Corin  Reason for referral:  Chief Complaint   Patient presents with   .  PAD     Bilateral angiogtam of 10/31/11, bilateral pain duration 2-3 months   .   PVD    HISTORY OF PRESENT ILLNESS:  Patient comes in today for further evaluation of his peripheral vascular disease. Patient began having pain in both of his feet approximately 3-4 months ago. He initially was seen by [facility] who [facility] to [facility] for further diagnostic imaging and evaluation. There he was found to have ankle-brachial indices and the 0.3 range and was scheduled for angiogram this past week. Angiographic findings include bilateral iliac artery occlusion. Severe common femoral disease bilaterally. The right superficial femoral artery is occluded. Bilateral popliteal arteries are occluded. Diffuse tibial disease bilaterally. He appears to have single-vessel runoff on the right via the anterior tibial artery. All 3 vessels appear to be patent on the left. There is diminutive peroneal on the right. The patient reports constant pain in both his feet. This does keep him up at night. He is limited to walking less than 50 feet before his legs give out and clamp.  The patient long-standing history of smoking. He is trying to quit. He suffers from hypertension which is managed with an ACE inhibitor. He also has a seizure disorder. His last seizure was approximately 6 months ago. He does have a history of stroke. He has no residual deficits.   Past Medical History   Diagnosis  Date   .  Stroke  Sept. 2012    Past Surgical History   Procedure  Date   .  Angiogram bilateral  Oct. 23, 2013    History    Social History   .  Marital Status:  Married     Spouse Name:  N/A     Number of Children:  N/A   .  Years of Education:  N/A    Occupational History   .  Not on file.    Social  History Main Topics   .  Smoking status:  Current Every Day Smoker -- 1.5 packs/day for 40 years     Types:  Cigarettes   .  Smokeless tobacco:  Not on file   .  Alcohol Use:  No   .  Drug Use:  No   .  Sexually Active:  Not on file    Other Topics  Concern   .  Not on file    Social History Narrative   .  No narrative on file    Family History   Problem  Relation  Age of Onset   .  Heart disease  Mother    .  Stroke  Father     Allergies as of 11/05/2011 - Review Complete 11/05/2011   Allergen  Reaction  Noted   .  Penicillins  Nausea And Vomiting     Current Outpatient Prescriptions on File Prior to Visit   Medication  Sig  Dispense  Refill   .  aspirin 81 MG tablet  Take 81 mg by mouth daily.     .  EPITOL 200 MG tablet  Take 400 mg by mouth 3 (three) times daily.     [name]  gabapentin (NEURONTIN) 600 MG tablet  Take 600 mg by mouth 3 (three) times daily.     [name]  levETIRAcetam (KEPPRA) 500 MG tablet  Take 500 mg by mouth 2 (two) times daily.     [name]  lisinopril (PRINIVIL,ZESTRIL) 20 MG tablet  Take 20 mg by mouth daily.     .  Multiple Vitamin (MULTIVITAMIN) tablet  Take 1 tablet by mouth daily.     [name]  oxycodone (OXY-IR) 5 MG capsule  Take 1 capsule (5 mg total) by mouth every 6 (six) hours as needed for pain.  60 capsule  0   .  vitamin E 400 UNIT capsule  Take 400 Units by mouth daily.      REVIEW OF SYSTEMS:  Cardiovascular: No chest pain, chest pressure, palpitations, orthopnea, or dyspnea on exertion. Positive rest pain and claudication, No history of DVT or phlebitis.  Pulmonary: No productive cough, asthma or wheezing.  Neurologic: Positive leg weakness.  Hematologic: No bleeding problems or clotting disorders.  Musculoskeletal: No joint pain or joint swelling.  Gastrointestinal: No blood in stool or hematemesis  Genitourinary: No dysuria or hematuria.  Psychiatric:: No history of major depression.  Integumentary: No rashes or ulcers.  Constitutional: No fever. positive  chills.  PHYSICAL EXAMINATION:  General: The patient appears their stated age. Vital signs are BP 148/90  Pulse 65  Resp 16  Ht 5\' 9"  (1.753 m)  Wt 163 lb 9.6 oz (74.208 kg)  BMI 24.16 kg/m2  SpO2 100%  HEENT: No gross abnormalities  Pulmonary: Respirations are non-labored  Abdomen: Soft and non-tender  Musculoskeletal: There are no major deformities.  Neurologic: No focal weakness or paresthesias are detected,  Skin: There are no ulcer or rashes noted.  Psychiatric: The patient has normal affect.  Cardiovascular: There is a regular rate and rhythm without significant murmur appreciated. No carotid bruits. Pedal pulses are not palpable   Diagnostic Studies:  I have reviewed his arteriogram with the above findings.  Carotid Doppler study today shows 40-59% left-sided stenosis at 1-39% right.   Outside Studies/Documentation  Historical records were reviewed. They showed severe peripheral vascular disease with rest pain   Medication Changes:  None   Assessment:  Peripheral vascular disease with rest pain   Plan:  I have diagrammed out the patient's multilevel disease. We discussed treatment options which in my opinion are only surgical at this time. I believe he is a candidate for aortobifemoral bypass grafting. He will require common femoral and profunda femoral artery endarterectomy. I discussed all of the potential risks with the patient. These include but are not limited to the need for an additional outflow procedure, the risk of graft infection, the risk of cardiopulmonary complications, the risk of death. We discussed the length of stay and recovery time. All of his questions were answered. He understands that he needs to quit smoking and that he may require additional procedures in the future to help with his symptoms. I offered him the ability to do this this coming Thursday however this was too soon for him therefore his procedure has been scheduled for Friday, November 15.  This will be an aortobifemoral bypass graft. This will require bilateral femoral endarterectomies and profundoplasties. I did get him 30 oxycodone 5 mg tablets today. He will contact me should he develop ulceration or open wounds on his feet.   He reports no changes since his last clinic visit.  Jorge Ny, M.D.  [facility] [place]  Office: [phone]  Pager: [phone]

## 2011-12-13 NOTE — Transfer of Care (Signed)
Immediate Anesthesia Transfer of Care Note  Patient: Lee [name]  Procedure(s) Performed: Procedure(s) (LRB) with comments: AORTA BIFEMORAL BYPASS GRAFT (Bilateral) - Aorta Bifemoral bypass reimplantation inferior messenteriic artery CYSTOSCOPY FLEXIBLE (N/A) - Flexible cystoscopy with foley placement. ENDARTERECTOMY FEMORAL (Right) - Right femoral endarterectomy with angioplasty  Patient Location: PACU  Anesthesia Type:General  Level of Consciousness: awake and patient cooperative  Airway & Oxygen Therapy: Patient Spontanous Breathing and Patient connected to nasal cannula oxygen  Post-op Assessment: Report given to PACU RN and Patient moving all extremities X 4  Post vital signs: Reviewed and stable  Complications: No apparent anesthesia complications

## 2011-12-13 NOTE — Anesthesia Procedure Notes (Signed)
Procedure Name: Intubation Date/Time: 12/13/2011 10:05 AM Performed by: Jefm Miles E Pre-anesthesia Checklist: Patient identified, Timeout performed, Emergency Drugs available, Suction available and Patient being monitored Patient Re-evaluated:Patient Re-evaluated prior to inductionOxygen Delivery Method: Circle system utilized Preoxygenation: Pre-oxygenation with 100% oxygen Intubation Type: IV induction Ventilation: Mask ventilation without difficulty Laryngoscope Size: Mac and 3 Grade View: Grade I Tube type: Oral Tube size: 7.5 mm Number of attempts: 1 Airway Equipment and Method: Stylet Placement Confirmation: ETT inserted through vocal cords under direct vision,  breath sounds checked- equal and bilateral and positive ETCO2 Secured at: 23 cm Tube secured with: Tape Dental Injury: Teeth and Oropharynx as per pre-operative assessment  Comments: Intubation performed by Burnett Harry, EMT student with supervision by Victoriano Lain, CRNA and Dr. Fleeta Emmer
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
## 2011-12-13 NOTE — Preoperative (Signed)
Beta Blockers   Reason not to administer Beta Blockers:Not Applicable 

## 2011-12-14 ENCOUNTER — Inpatient Hospital Stay (HOSPITAL_COMMUNITY): Payer: Medicare Other

## 2011-12-14 ENCOUNTER — Encounter (HOSPITAL_COMMUNITY): Payer: Self-pay | Admitting: Surgery

## 2011-12-14 ENCOUNTER — Other Ambulatory Visit: Payer: Self-pay

## 2011-12-14 LAB — AMYLASE: Amylase: 57 U/L (ref 0–105)

## 2011-12-14 LAB — CBC
MCHC: 36.6 g/dL — ABNORMAL HIGH (ref 30.0–36.0)
MCV: 93.4 fL (ref 78.0–100.0)
Platelets: 246 10*3/uL (ref 150–400)
RDW: 13.3 % (ref 11.5–15.5)
WBC: 18.2 10*3/uL — ABNORMAL HIGH (ref 4.0–10.5)

## 2011-12-14 LAB — COMPREHENSIVE METABOLIC PANEL
AST: 32 U/L (ref 0–37)
Albumin: 3 g/dL — ABNORMAL LOW (ref 3.5–5.2)
Calcium: 8.4 mg/dL (ref 8.4–10.5)
Chloride: 94 mEq/L — ABNORMAL LOW (ref 96–112)
Creatinine, Ser: 0.56 mg/dL (ref 0.50–1.35)
Total Protein: 5.6 g/dL — ABNORMAL LOW (ref 6.0–8.3)

## 2011-12-14 LAB — MAGNESIUM: Magnesium: 2 mg/dL (ref 1.5–2.5)

## 2011-12-14 MED ORDER — LEVETIRACETAM 100 MG/ML PO SOLN
500.0000 mg | Freq: Two times a day (BID) | ORAL | Status: DC
  Administered 2011-12-14 – 2011-12-15 (×2): 500 mg via ORAL
  Filled 2011-12-14 (×3): qty 5

## 2011-12-14 MED ORDER — KETOROLAC TROMETHAMINE 30 MG/ML IJ SOLN
30.0000 mg | Freq: Four times a day (QID) | INTRAMUSCULAR | Status: DC
Start: 2011-12-14 — End: 2011-12-14

## 2011-12-14 MED ORDER — BIOTENE DRY MOUTH MT LIQD
15.0000 mL | Freq: Two times a day (BID) | OROMUCOSAL | Status: DC
  Administered 2011-12-14 – 2011-12-15 (×3): 15 mL via OROMUCOSAL

## 2011-12-14 MED ORDER — GABAPENTIN 250 MG/5ML PO SOLN
400.0000 mg | Freq: Three times a day (TID) | ORAL | Status: DC
  Administered 2011-12-14 – 2011-12-15 (×2): 400 mg via ORAL
  Filled 2011-12-14 (×5): qty 8

## 2011-12-14 MED ORDER — KETOROLAC TROMETHAMINE 30 MG/ML IJ SOLN
30.0000 mg | Freq: Four times a day (QID) | INTRAMUSCULAR | Status: AC
  Administered 2011-12-14 – 2011-12-17 (×12): 30 mg via INTRAVENOUS
  Filled 2011-12-14 (×12): qty 1

## 2011-12-14 MED ORDER — CARBAMAZEPINE 100 MG/5ML PO SUSP
200.0000 mg | Freq: Three times a day (TID) | ORAL | Status: DC
  Administered 2011-12-14 – 2011-12-15 (×2): 200 mg via ORAL
  Filled 2011-12-14 (×4): qty 10

## 2011-12-14 MED ORDER — CHLORHEXIDINE GLUCONATE 0.12 % MT SOLN
15.0000 mL | Freq: Two times a day (BID) | OROMUCOSAL | Status: DC
  Administered 2011-12-14 – 2011-12-19 (×11): 15 mL via OROMUCOSAL
  Filled 2011-12-14 (×13): qty 15
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
## 2011-12-14 NOTE — Anesthesia Postprocedure Evaluation (Signed)
  Anesthesia Post-op Note  Patient: Lee [name]  Procedure(s) Performed: Procedure(s) (LRB) with comments: AORTA BIFEMORAL BYPASS GRAFT (Bilateral) - Aorta Bifemoral bypass reimplantation inferior messenteriic artery CYSTOSCOPY FLEXIBLE (N/A) - Flexible cystoscopy with foley placement. ENDARTERECTOMY FEMORAL (Right) - Right femoral endarterectomy with angioplasty  Patient Location: SICU  Anesthesia Type:General  Level of Consciousness: awake, alert  and oriented  Airway and Oxygen Therapy: Patient Spontanous Breathing and Patient connected to nasal cannula oxygen  Post-op Pain: none  Post-op Assessment: Post-op Vital signs reviewed, Patient's Cardiovascular Status Stable, Respiratory Function Stable, Patent Airway, No signs of Nausea or vomiting, Adequate PO intake and Pain level controlled  Post-op Vital Signs: Reviewed and stable  Complications: No apparent anesthesia complications

## 2011-12-14 NOTE — Evaluation (Signed)
Physical Therapy Evaluation Patient Details Name: Lee [name] MRN: [id number] DOB: [birth year] Today's Date: 12/14/2011 Time: 1478-2956 PT Time Calculation (min): 38 min  PT Assessment / Plan / Recommendation Clinical Impression  Pt s/p aortobifemoral bypass graft and right femoral endarterectomy.    Pt tolerated ambulation well and is limited mostly by pain.  Will benefit from PT to address endurance and balance with mobility.  Should be able to progress and go home with wife with HHPT f/u.  May need RW and 3N1.      PT Assessment  Patient needs continued PT services    Follow Up Recommendations  Home health PT;Supervision/Assistance - 24 hour    Does the patient have the potential to tolerate intense rehabilitation      Barriers to Discharge        Equipment Recommendations  Rolling walker with 5" wheels;3 in 1 bedside comode    Recommendations for Other Services     Frequency Min 3X/week    Precautions / Restrictions Precautions Precautions: Fall Restrictions Weight Bearing Restrictions: No   Pertinent Vitals/Pain VSS, Some pain      Mobility  Bed Mobility Bed Mobility: Rolling Right;Right Sidelying to Sit;Sitting - Scoot to Edge of Bed Rolling Right: 3: Mod assist Right Sidelying to Sit: 3: Mod assist;HOB flat Sitting - Scoot to Edge of Bed: 4: Min assist Details for Bed Mobility Assistance: Pt held onto pillow secondary to abdominal pain.  Pt needed cues for technique to decr pain in incision.  Pt needed constant cues as he had difficulty following commands and appeared to have slow processing.  Transfers Transfers: Sit to Stand;Stand to Sit Sit to Stand: 4: Min assist;With upper extremity assist;From bed Stand to Sit: 4: Min assist;With upper extremity assist;With armrests;To chair/3-in-1 Details for Transfer Assistance: cues needed for hand placement for sit to stand to sit.  Pt held onto pillow for sit to stand but then took 5 minutes to get patient to  hold onto wheelchair handles as he could not process to let go of pillow to hold onto handles of chair.   Ambulation/Gait Ambulation/Gait Assistance: 4: Min assist Ambulation Distance (Feet): 300 Feet Assistive device:  (pushing wheelchair) Ambulation/Gait Assistance Details: Pt needed cues for upright posture.  Needed cues for safety. Gait Pattern: Step-to pattern;Decreased stride length;Trunk flexed;Wide base of support;Shuffle Stairs: No Wheelchair Mobility Wheelchair Mobility: No              PT Diagnosis: Generalized weakness;Acute pain  PT Problem List: Decreased activity tolerance;Decreased balance;Decreased mobility;Decreased safety awareness;Decreased knowledge of use of DME;Decreased knowledge of precautions;Pain PT Treatment Interventions: DME instruction;Gait training;Functional mobility training;Therapeutic activities;Therapeutic exercise;Balance training;Patient/family education;Stair training   PT Goals Acute Rehab PT Goals PT Goal Formulation: With patient Time For Goal Achievement: 12/21/11 Potential to Achieve Goals: Good Pt will go Supine/Side to Sit: Independently PT Goal: Supine/Side to Sit - Progress: Goal set today Pt will go Sit to Stand: Independently PT Goal: Sit to Stand - Progress: Goal set today Pt will Ambulate: >150 feet;with modified independence;with least restrictive assistive device PT Goal: Ambulate - Progress: Goal set today Pt will Go Up / Down Stairs: 3-5 stairs;with modified independence;with least restrictive assistive device PT Goal: Up/Down Stairs - Progress: Goal set today  Visit Information  Last PT Received On: 12/14/11 Assistance Needed: +1    Subjective Data  Subjective: "I feel so weak." Patient Stated Goal: To go home with wife   Prior Functioning  Home Living Lives With: Spouse Available Help  at Discharge: Family;Available 24 hours/day (wife works at home) Type of Home: House Home Access: Stairs to enter [employer] of Steps: 3 Entrance Stairs-Rails: Can reach both;Right;Left Home Layout: One level Bathroom Shower/Tub: Tub/shower unit;Walk-in shower (pt used walk in shower) Firefighter: Standard Home Adaptive Equipment: Straight cane Prior Function Level of Independence: Independent with assistive device(s) (used cane) Able to Take Stairs?: Yes Driving: Yes Vocation: On disability Communication Communication: No difficulties Dominant Hand: Right    Cognition  Overall Cognitive Status: Appears within functional limits for tasks assessed/performed Arousal/Alertness: Awake/alert Orientation Level: Appears intact for tasks assessed Behavior During Session: [facility] for tasks performed    Extremity/Trunk Assessment Right Lower Extremity Assessment RLE ROM/Strength/Tone: [facility] for tasks assessed Left Lower Extremity Assessment LLE ROM/Strength/Tone: [facility] for tasks assessed   Balance Static Standing Balance Static Standing - Balance Support: Bilateral upper extremity supported;During functional activity Static Standing - Level of Assistance: 5: Stand by assistance Static Standing - Comment/# of Minutes: 2 minutes holding onto wheelchair  End of Session PT - End of Session Equipment Utilized During Treatment: Gait belt;Oxygen (3L O2) Activity Tolerance: Patient tolerated treatment well Patient left: in chair;with call bell/phone within reach Nurse Communication: Mobility status       INGOLD,Diana Davenport 12/14/2011, 12:43 PM  [facility] [phone] (pager)

## 2011-12-14 NOTE — Progress Notes (Addendum)
[facility] [place]  Post-op  Intra-abdominal Surgery note  Date of Surgery: 12/13/2011  Surgeon(s): Nada Libman, MD Lindaann Slough, MD Chuck Hint, MD Nada Libman, MD  1 Day Post-Op Procedure(s): AORTA BIFEMORAL BYPASS GRAFT CYSTOSCOPY FLEXIBLE ENDARTERECTOMY FEMORAL  History of Present Illness  Lee [name] is a 61 y.o. male who is  up s/p Procedure(s): AORTA BIFEMORAL BYPASS GRAFT CYSTOSCOPY FLEXIBLE ENDARTERECTOMY FEMORAL Pt is doing well. complains of incisional pain; denies nausea/vomiting; denies diarrhea. has not had flatus;has not had BM  IMAGING: Dg Chest Portable 1 View  12/13/2011  *RADIOLOGY REPORT*  Clinical Data: Postop aorto bifemoral bypass graft, central line placement  PORTABLE CHEST - 1 VIEW  Comparison: Chest x-ray of 05/17/2011  Findings: The right IJ central venous line is present with the tip overlying the upper SVC.  No pneumothorax is seen.  Basilar atelectasis is noted and there may be minimal pulmonary vascular congestion present.  Mild cardiomegaly is stable.  IMPRESSION: Right IJ central venous line tip overlies the upper SVC.  No pneumothorax.  Question mild pulmonary vascular congestion.   Original Report Authenticated By: Dwyane Dee, M.D.    Dg Abd Portable 1v  12/13/2011  *RADIOLOGY REPORT*  Clinical Data: Nasogastric tube placement.  Postop.  PORTABLE ABDOMEN - 1 VIEW  Comparison: Prior today  Findings: A nasogastric tube is seen with tip in the body the stomach.  Moderate to large stool burden noted. Decreased distention of small bowel loops noted in the lower abdomen and pelvis.  IMPRESSION: Nasogastric tube tip in the body of stomach.   Original Report Authenticated By: Myles Rosenthal, M.D.    Dg Abd Portable 1v  12/13/2011  *RADIOLOGY REPORT*  Clinical Data: Status post aortobifemoral bypass grafting.  PORTABLE ABDOMEN - 1 VIEW  Comparison: None.  Findings: Moderate to large stool burden noted. Mild small  bowel and colonic distention is seen suspicious for mild ileus.  The catheter is noted within the pelvis.  Heavy atherosclerotic calcification of the iliac or base is noted as well as retroperitoneal surgical clips.  IMPRESSION: Probable mild postop ileus.  Moderate to large stool burden also noted.   Original Report Authenticated By: Myles Rosenthal, M.D.     Significant Diagnostic Studies: CBC Lab Results  Component Value Date   WBC 18.2* 12/14/2011   HGB 14.0 12/14/2011   HCT 38.3* 12/14/2011   MCV 93.4 12/14/2011   PLT 246 12/14/2011    BMET    Component Value Date/Time   NA 128* 12/14/2011 0400   K 4.6 12/14/2011 0400   CL 94* 12/14/2011 0400   CO2 24 12/14/2011 0400   GLUCOSE 147* 12/14/2011 0400   BUN 7 12/14/2011 0400   CREATININE 0.56 12/14/2011 0400   CALCIUM 8.4 12/14/2011 0400   GFRNONAA >90 12/14/2011 0400   GFRAA >90 12/14/2011 0400    COAG Lab Results  Component Value Date   INR 1.25 12/13/2011   INR 0.97 12/03/2011   INR 1.01 11/16/2011   No results found for this basename: PTT    I/O last 3 completed shifts: In: 5705 [I.V.:5075; Blood:200; NG/GT:180; IV Piggyback:250] Out: 1810 [Urine:1160; Emesis/NG output:150; Blood:500]    Physical Examination BP Readings from Last 3 Encounters:  12/14/11 161/75  12/14/11 161/75  12/05/11 139/79   Temp Readings from Last 3 Encounters:  12/14/11 99.2 F (37.3 C) Oral  12/14/11 99.2 F (37.3 C) Oral  12/03/11 98.1 F (36.7 C)    SpO2 Readings from Last  3 Encounters:  12/14/11 100%  12/14/11 100%  12/05/11 100%   Pulse Readings from Last 3 Encounters:  12/14/11 95  12/14/11 95  12/05/11 78    General: A&O x 3, WDWN male in NAD Pulmonary: normal non-labored breathing , without Rales, rhonchi,  wheezing Cardiac: Heart rate : regular ,  Abdomen:hypoactive active bowel sounds Abdominal wound:clean, dry, intact  Neurologic: A&O X 3; Appropriate Affect ; SENSATION: normal; MOTOR FUNCTION:  moving all extremities equally.  Speech is fluent/normal  Vascular Exam:BLE warm and well perfused Extremities without ischemic changes, no Gangrene, no cellulitis; no open wounds;   LOWER EXTREMITY PULSES  Right DP doppler intact, left DP/PT doppler intact.   Non-Invasive Vascular Imaging Pending  Assessment/Plan: Lee [name] is a 61 y.o. male who is 1 Day Post-Op Procedure(s): AORTA BIFEMORAL BYPASS GRAFT CYSTOSCOPY FLEXIBLE ENDARTERECTOMY FEMORAL Maintain restraint orders secondary to pulling NG out post-op.  NG output 150cc in the last 12 hours. Foley to gravity.  Maintain foley for 9 more days per Dr. Brunilda Payor. D/C A line due to malfunction in readings.  Patient BP stable prior to malfunction and cuff pressures stable.      Clinton Gallant [facility] [phone] 12/14/2011 7:37 AM

## 2011-12-14 NOTE — Evaluation (Signed)
Occupational Therapy Evaluation Patient Details Name: Lee [name] MRN: [id number] DOB: 04-20-1950 Today's Date: 12/14/2011 Time: 6962-9528 OT Time Calculation (min): 26 min  OT Assessment / Plan / Recommendation Clinical Impression    Pt s/p aortobifemoral bypass graft and right femoral endarterectomy .  Pt. presents to OT with generalized weakness and pain.  He will benefit from OT to maximize safety and independence with BADLs to allow pt. to return home with wife at supervision level    OT Assessment  Patient needs continued OT Services    Follow Up Recommendations  No OT follow up;Supervision - Intermittent;Supervision/Assistance - 24 hour    Barriers to Discharge None    Equipment Recommendations  None recommended by OT    Recommendations for Other Services    Frequency  Min 2X/week    Precautions / Restrictions Precautions Precautions: Fall Restrictions Weight Bearing Restrictions: No       ADL  Eating/Feeding: NPO Grooming: Wash/dry hands;Wash/dry face;Teeth care;Supervision/safety Where Assessed - Grooming: Supported sitting Upper Body Bathing: Minimal assistance Where Assessed - Upper Body Bathing: Supported sitting Lower Body Bathing: Moderate assistance Where Assessed - Lower Body Bathing: Supported sit to stand Upper Body Dressing: Moderate assistance Where Assessed - Upper Body Dressing: Unsupported sitting Lower Body Dressing: Moderate assistance Where Assessed - Lower Body Dressing: Supported sit to Pharmacist, hospital: Minimal Dentist Method: Sit to Barista: Comfort height toilet Toileting - Clothing Manipulation and Hygiene: Moderate assistance Where Assessed - Toileting Clothing Manipulation and Hygiene: Standing Transfers/Ambulation Related to ADLs: Pt. ambulates with min A ADL Comments: Pt limited by pain and lines    OT Diagnosis: Generalized weakness;Acute pain  OT Problem List: Decreased  strength;Decreased activity tolerance;Impaired balance (sitting and/or standing) OT Treatment Interventions: Self-care/ADL training;DME and/or AE instruction;Patient/family education;Balance training   OT Goals Acute Rehab OT Goals OT Goal Formulation: With patient Time For Goal Achievement: 12/28/11 Potential to Achieve Goals: Good ADL Goals Pt Will Perform Grooming: with supervision;Standing at sink ADL Goal: Grooming - Progress: Goal set today Pt Will Perform Upper Body Bathing: with set-up;Sitting, chair ADL Goal: Upper Body Bathing - Progress: Goal set today Pt Will Perform Lower Body Bathing: with supervision;Sit to stand from chair;Sit to stand from bed ADL Goal: Lower Body Bathing - Progress: Goal set today Pt Will Perform Upper Body Dressing: with set-up;Sitting, chair;Sitting, bed ADL Goal: Upper Body Dressing - Progress: Goal set today Pt Will Perform Lower Body Dressing: with supervision;Sit to stand from chair;Sit to stand from bed ADL Goal: Lower Body Dressing - Progress: Goal set today Pt Will Transfer to Toilet: with supervision;Ambulation;Comfort height toilet ADL Goal: Toilet Transfer - Progress: Goal set today Pt Will Perform Toileting - Clothing Manipulation: with supervision;Standing ADL Goal: Toileting - Clothing Manipulation - Progress: Goal set today Pt Will Perform Tub/Shower Transfer: with supervision;Tub transfer;Ambulation ADL Goal: Tub/Shower Transfer - Progress: Goal set today  Visit Information  Last OT Received On: 12/14/11 Assistance Needed: +1    Subjective Data  Subjective: "I just need to take care of this"  re: pulse ox line Patient Stated Goal: To get better   Prior Functioning     Home Living Lives With: Spouse Available Help at Discharge: Family;Available 24 hours/day Type of Home: House Home Access: Stairs to enter [employer] of Steps: 3 Entrance Stairs-Rails: Can reach both;Right;Left Home Layout: One level Bathroom  Shower/Tub: Tub/shower unit;Walk-in shower Bathroom Toilet: Standard Home Adaptive Equipment: Straight cane Prior Function Level of Independence: Independent with assistive  device(s) (used SPC) Able to Take Stairs?: Yes Driving: Yes Vocation: On disability Communication Communication: No difficulties Dominant Hand: Right         Vision/Perception     Cognition  Overall Cognitive Status: Impaired Arousal/Alertness: Awake/alert Orientation Level: Appears intact for tasks assessed Behavior During Session: [facility] for tasks performed Cognition - Other Comments: Pt. with intermittent confusion noted    Extremity/Trunk Assessment Right Upper Extremity Assessment RUE ROM/Strength/Tone: Within functional levels RUE Coordination: WFL - gross/fine motor Left Upper Extremity Assessment LUE ROM/Strength/Tone: Within functional levels LUE Coordination: WFL - gross/fine motor     Mobility Bed Mobility Bed Mobility: Rolling Left;Left Sidelying to Sit;Sitting - Scoot to Edge of Bed Rolling Left: 3: Mod assist;With rail Left Sidelying to Sit: 3: Mod assist;With rails Sitting - Scoot to Edge of Bed: 4: Min guard Details for Bed Mobility Assistance: Pt. requires cues for technique and handl placement.  Pt. held pillow against abdomen during mobility Transfers Transfers: Sit to Stand;Stand to Sit Sit to Stand: 4: Min assist;With upper extremity assist;From bed Stand to Sit: 4: Min assist;With upper extremity assist;With armrests;To chair/3-in-1 Details for Transfer Assistance: Pt. required cues for hand placement.       Shoulder Instructions     Exercise     Balance Balance Balance Assessed: Yes Static Standing Balance Static Standing - Balance Support: Left upper extremity supported Static Standing - Level of Assistance: Other (comment) (min guard assist) Static Standing - Comment/# of Minutes: >5 mins   End of Session OT - End of Session Activity Tolerance: Patient limited by  pain Patient left: in chair;with call bell/phone within reach Nurse Communication: Mobility status  GO     Lee [name] 12/14/2011, 5:17 PM

## 2011-12-15 LAB — CBC
MCH: 34.7 pg — ABNORMAL HIGH (ref 26.0–34.0)
MCHC: 36.6 g/dL — ABNORMAL HIGH (ref 30.0–36.0)
Platelets: 212 10*3/uL (ref 150–400)
RDW: 13.5 % (ref 11.5–15.5)

## 2011-12-15 LAB — BASIC METABOLIC PANEL
BUN: 9 mg/dL (ref 6–23)
Calcium: 8.4 mg/dL (ref 8.4–10.5)
Creatinine, Ser: 0.7 mg/dL (ref 0.50–1.35)
GFR calc Af Amer: >90 mL/min (ref >90)
GFR calc non Af Amer: >90 mL/min (ref >90)
Potassium: 4.4 mEq/L (ref 3.5–5.1)

## 2011-12-15 MED ORDER — GABAPENTIN 400 MG PO CAPS
400.0000 mg | ORAL_CAPSULE | Freq: Three times a day (TID) | ORAL | Status: DC
  Filled 2011-12-15 (×3): qty 1

## 2011-12-15 MED ORDER — LEVETIRACETAM 500 MG PO TABS
500.0000 mg | ORAL_TABLET | Freq: Two times a day (BID) | ORAL | Status: DC
  Administered 2011-12-15 – 2011-12-19 (×8): 500 mg via ORAL
  Filled 2011-12-15 (×10): qty 1

## 2011-12-15 MED ORDER — CARBAMAZEPINE 200 MG PO TABS
400.0000 mg | ORAL_TABLET | Freq: Three times a day (TID) | ORAL | Status: DC
  Administered 2011-12-15 – 2011-12-19 (×12): 400 mg via ORAL
  Filled 2011-12-15 (×14): qty 2

## 2011-12-15 MED ORDER — PANTOPRAZOLE SODIUM 40 MG PO TBEC
40.0000 mg | DELAYED_RELEASE_TABLET | Freq: Every day | ORAL | Status: DC
  Administered 2011-12-16 – 2011-12-19 (×4): 40 mg via ORAL
  Filled 2011-12-15 (×4): qty 1

## 2011-12-15 MED ORDER — ASPIRIN 81 MG PO TABS: 81.0000 mg | ORAL_TABLET | Freq: Every day | ORAL | Status: DC

## 2011-12-15 MED ORDER — NICOTINE 14 MG/24HR TD PT24
14.0000 mg | MEDICATED_PATCH | Freq: Every day | TRANSDERMAL | Status: DC
  Administered 2011-12-15 – 2011-12-19 (×5): 14 mg via TRANSDERMAL
  Filled 2011-12-15 (×5): qty 1

## 2011-12-15 MED ORDER — ASPIRIN 81 MG PO CHEW
81.0000 mg | CHEWABLE_TABLET | Freq: Every day | ORAL | Status: DC
  Administered 2011-12-15 – 2011-12-19 (×5): 81 mg via ORAL
  Filled 2011-12-15 (×5): qty 1

## 2011-12-15 MED ORDER — SODIUM CHLORIDE 0.9 % IV SOLN
INTRAVENOUS | Status: DC
  Administered 2011-12-15: 10:00:00 via INTRAVENOUS
  Administered 2011-12-15: 75 mL/h via INTRAVENOUS
  Administered 2011-12-16: 14:00:00 via INTRAVENOUS
  Administered 2011-12-17: 75 mL/h via INTRAVENOUS
  Administered 2011-12-18 (×2): via INTRAVENOUS

## 2011-12-15 MED ORDER — GABAPENTIN 600 MG PO TABS
600.0000 mg | ORAL_TABLET | Freq: Three times a day (TID) | ORAL | Status: DC
  Administered 2011-12-15 – 2011-12-16 (×5): 600 mg via ORAL
  Filled 2011-12-15 (×8): qty 1

## 2011-12-15 MED ORDER — CARBAMAZEPINE 200 MG PO TABS
200.0000 mg | ORAL_TABLET | Freq: Three times a day (TID) | ORAL | Status: DC
  Filled 2011-12-15 (×3): qty 1

## 2011-12-15 NOTE — Progress Notes (Signed)
Pt transferred to 2000 with wheelchair and monitor. Pt was placed in chair and nursing report was called prior to transfer. [facility] RN with report. Pt tolerated transfer well

## 2011-12-15 NOTE — Progress Notes (Signed)
Pharmacy note  Re: anticonvulsant medications  Requested to confirm patient's outpatient regimens of Tegretol (carbamazepine), Keppra and Gabapentin. Discussed with patient's wife (patient drowsy). Confirmed outpatient regimens with [facility], mail order Rx.   - Epitol (carbamazepine) 400 mg TID   - Keppra 500 mg BID  - Gabapentin 600 mg TID.  Per prior med info, he has been on Carbamazepine 200 mg TID and Gabapentin 400 mg TID since admitted; Keppra dose has been as confirmed. Carbamazepine level therapeutic 11/24/11.  Discussed with R. Roczniak, PA-C, patient and wife. Regimens adjusted to outpatient doses. Med rec list updated.  Hilarie Fredrickson, RPh Pager: [phone] 12/15/2011 12pm

## 2011-12-15 NOTE — Progress Notes (Signed)
Increased temp-pulmonary toilet with IS and cough deep breath exercises.

## 2011-12-15 NOTE — Progress Notes (Signed)
Subjective: Interval History: none..Mild confusion last PM.  No nausea   Objective: Vital signs in last 24 hours: Temp:  [98.8 F (37.1 C)-101.2 F (38.4 C)] 98.8 F (37.1 C) (12/07 0723) Pulse Rate:  [82-107] 93  (12/07 0800) Resp:  [14-35] 21  (12/07 0800) BP: (121-169)/(56-87) 133/87 mmHg (12/07 0800) SpO2:  [96 %-100 %] 100 % (12/07 0800) Weight:  [164 lb 14.5 oz (74.8 kg)] 164 lb 14.5 oz (74.8 kg) (12/07 0700)  Intake/Output from previous day: 12/06 0701 - 12/07 0700 In: 3145 [I.V.:2875; NG/GT:270] Out: 1530 [Urine:830; Emesis/NG output:700] Intake/Output this shift: Total I/O In: 145 [I.V.:125; NG/GT:20] Out: 25 [Urine:25]  Abd soft, + BS, Feet well perfused  Lab Results:  Basename 12/15/11 0427 12/14/11 0400  WBC 19.5* 18.2*  HGB 11.7* 14.0  HCT 32.0* 38.3*  PLT 212 246   BMET  Basename 12/15/11 0427 12/14/11 0400  NA 125* 128*  K 4.4 4.6  CL 94* 94*  CO2 26 24  GLUCOSE 136* 147*  BUN 9 7  CREATININE 0.70 0.56  CALCIUM 8.4 8.4    Studies/Results: Dg Chest Portable 1 View  12/14/2011  *RADIOLOGY REPORT*  Clinical Data: Postoperative evaluation.  History of aortobifemoral bypass grafting.  PORTABLE CHEST - 1 VIEW  Comparison: 12/13/2011.  Findings: Right internal jugular venous line tip terminates in the area of the superior vena cava.  No pneumothorax is evident. Enteric tube is in place with distal portion entering stomach area. Tip is not included on image.  Cardiac silhouette is upper normal size.  There is elevation of the margin of the right hemidiaphragm with minimal atelectasis and infiltrate in the medial right base. No consolidation or pleural effusion is evident.  No pulmonary edema is evident.  No skeletal lesion is seen.  IMPRESSION: Right internal jugular venous line and enteric tube are in place. No pneumothorax is evident.  Continued minimal atelectasis and infiltrate in right medial base without evidence of consolidation or pleural effusion.   Decrease in vascular congestion since previous study.  No evidence of pulmonary edema.   Original Report Authenticated By: Onalee Hua Call    Dg Chest Portable 1 View  12/13/2011  *RADIOLOGY REPORT*  Clinical Data: Postop aorto bifemoral bypass graft, central line placement  PORTABLE CHEST - 1 VIEW  Comparison: Chest x-ray of 05/17/2011  Findings: The right IJ central venous line is present with the tip overlying the upper SVC.  No pneumothorax is seen.  Basilar atelectasis is noted and there may be minimal pulmonary vascular congestion present.  Mild cardiomegaly is stable.  IMPRESSION: Right IJ central venous line tip overlies the upper SVC.  No pneumothorax.  Question mild pulmonary vascular congestion.   Original Report Authenticated By: Dwyane Dee, M.D.    Dg Abd Portable 1v  12/13/2011  *RADIOLOGY REPORT*  Clinical Data: Nasogastric tube placement.  Postop.  PORTABLE ABDOMEN - 1 VIEW  Comparison: Prior today  Findings: A nasogastric tube is seen with tip in the body the stomach.  Moderate to large stool burden noted. Decreased distention of small bowel loops noted in the lower abdomen and pelvis.  IMPRESSION: Nasogastric tube tip in the body of stomach.   Original Report Authenticated By: Myles Rosenthal, M.D.    Dg Abd Portable 1v  12/13/2011  *RADIOLOGY REPORT*  Clinical Data: Status post aortobifemoral bypass grafting.  PORTABLE ABDOMEN - 1 VIEW  Comparison: None.  Findings: Moderate to large stool burden noted. Mild small bowel and colonic distention is seen suspicious for mild  ileus.  The catheter is noted within the pelvis.  Heavy atherosclerotic calcification of the iliac or base is noted as well as retroperitoneal surgical clips.  IMPRESSION: Probable mild postop ileus.  Moderate to large stool burden also noted.   Original Report Authenticated By: Myles Rosenthal, M.D.    Anti-infectives: Anti-infectives     Start     Dose/Rate Route Frequency Ordered Stop   12/13/11 2300   vancomycin (VANCOCIN) IVPB  1000 mg/200 mL premix        1,000 mg 200 mL/hr over 60 Minutes Intravenous  Once 12/13/11 1935 12/13/11 2334   12/12/11 1414   vancomycin (VANCOCIN) IVPB 1000 mg/200 mL premix        1,000 mg 200 mL/hr over 60 Minutes Intravenous 60 min pre-op 12/12/11 1414 12/13/11 1055          Assessment/Plan: s/p Procedure(s) (LRB) with comments: AORTA BIFEMORAL BYPASS GRAFT (Bilateral) - Aorta Bifemoral bypass reimplantation inferior messenteriic artery CYSTOSCOPY FLEXIBLE (N/A) - Flexible cystoscopy with foley placement. ENDARTERECTOMY FEMORAL (Right) - Right femoral endarterectomy with angioplasty Doing well.  Change IV to NS.  Transfer to 2000.  Mobilize   LOS: 2 days   Encarnacion Bole 12/15/2011, 8:53 AM

## 2011-12-15 NOTE — Progress Notes (Signed)
Pt ambulated approx 150 feet with rolling walker. Tolerated well, however pt feels a little weak. Will encourage more walks today.

## 2011-12-15 NOTE — Progress Notes (Signed)
Pt ambulated again with rolling walker approx 180 feet. Pt back to bed to rest.

## 2011-12-16 LAB — BASIC METABOLIC PANEL
Calcium: 8.2 mg/dL — ABNORMAL LOW (ref 8.4–10.5)
GFR calc Af Amer: >90 mL/min (ref >90)
GFR calc non Af Amer: >90 mL/min (ref >90)
Potassium: 4 mEq/L (ref 3.5–5.1)
Sodium: 129 mEq/L — ABNORMAL LOW (ref 135–145)

## 2011-12-16 NOTE — Progress Notes (Signed)
Subjective: Interval History: none..   Objective: Vital signs in last 24 hours: Temp:  [98 F (36.7 C)-98.9 F (37.2 C)] 98.9 F (37.2 C) (12/08 0435) Pulse Rate:  [95-107] 101  (12/08 0435) Resp:  [18] 18  (12/08 0435) BP: (139-148)/(69-89) 147/89 mmHg (12/08 0435) SpO2:  [98 %-100 %] 100 % (12/08 0435) Weight:  [165 lb 14.4 oz (75.252 kg)] 165 lb 14.4 oz (75.252 kg) (12/08 0435)  Intake/Output from previous day: 12/07 0701 - 12/08 0700 In: 1743.8 [I.V.:1723.8; NG/GT:20] Out: 925 [Urine:775; Emesis/NG output:150] Intake/Output this shift:    Abdomen and groin incisions healing nicely. 2+ femoral pulses. Feet well perfused.  Lab Results:  Basename 12/15/11 0427 12/14/11 0400  WBC 19.5* 18.2*  HGB 11.7* 14.0  HCT 32.0* 38.3*  PLT 212 246   BMET  Basename 12/16/11 0620 12/15/11 0427  NA 129* 125*  K 4.0 4.4  CL 97 94*  CO2 22 26  GLUCOSE 81 136*  BUN 12 9  CREATININE 0.72 0.70  CALCIUM 8.2* 8.4    Studies/Results: Dg Chest Portable 1 View  12/14/2011  *RADIOLOGY REPORT*  Clinical Data: Postoperative evaluation.  History of aortobifemoral bypass grafting.  PORTABLE CHEST - 1 VIEW  Comparison: 12/13/2011.  Findings: Right internal jugular venous line tip terminates in the area of the superior vena cava.  No pneumothorax is evident. Enteric tube is in place with distal portion entering stomach area. Tip is not included on image.  Cardiac silhouette is upper normal size.  There is elevation of the margin of the right hemidiaphragm with minimal atelectasis and infiltrate in the medial right base. No consolidation or pleural effusion is evident.  No pulmonary edema is evident.  No skeletal lesion is seen.  IMPRESSION: Right internal jugular venous line and enteric tube are in place. No pneumothorax is evident.  Continued minimal atelectasis and infiltrate in right medial base without evidence of consolidation or pleural effusion.  Decrease in vascular congestion since previous  study.  No evidence of pulmonary edema.   Original Report Authenticated By: Onalee Hua Call    Dg Chest Portable 1 View  12/13/2011  *RADIOLOGY REPORT*  Clinical Data: Postop aorto bifemoral bypass graft, central line placement  PORTABLE CHEST - 1 VIEW  Comparison: Chest x-ray of 05/17/2011  Findings: The right IJ central venous line is present with the tip overlying the upper SVC.  No pneumothorax is seen.  Basilar atelectasis is noted and there may be minimal pulmonary vascular congestion present.  Mild cardiomegaly is stable.  IMPRESSION: Right IJ central venous line tip overlies the upper SVC.  No pneumothorax.  Question mild pulmonary vascular congestion.   Original Report Authenticated By: Dwyane Dee, M.D.    Dg Abd Portable 1v  12/13/2011  *RADIOLOGY REPORT*  Clinical Data: Nasogastric tube placement.  Postop.  PORTABLE ABDOMEN - 1 VIEW  Comparison: Prior today  Findings: A nasogastric tube is seen with tip in the body the stomach.  Moderate to large stool burden noted. Decreased distention of small bowel loops noted in the lower abdomen and pelvis.  IMPRESSION: Nasogastric tube tip in the body of stomach.   Original Report Authenticated By: Myles Rosenthal, M.D.    Dg Abd Portable 1v  12/13/2011  *RADIOLOGY REPORT*  Clinical Data: Status post aortobifemoral bypass grafting.  PORTABLE ABDOMEN - 1 VIEW  Comparison: None.  Findings: Moderate to large stool burden noted. Mild small bowel and colonic distention is seen suspicious for mild ileus.  The catheter is noted within the  pelvis.  Heavy atherosclerotic calcification of the iliac or base is noted as well as retroperitoneal surgical clips.  IMPRESSION: Probable mild postop ileus.  Moderate to large stool burden also noted.   Original Report Authenticated By: Myles Rosenthal, M.D.    Anti-infectives: Anti-infectives     Start     Dose/Rate Route Frequency Ordered Stop   12/13/11 2300   vancomycin (VANCOCIN) IVPB 1000 mg/200 mL premix        1,000 mg 200  mL/hr over 60 Minutes Intravenous  Once 12/13/11 1935 12/13/11 2334   12/12/11 1414   vancomycin (VANCOCIN) IVPB 1000 mg/200 mL premix        1,000 mg 200 mL/hr over 60 Minutes Intravenous 60 min pre-op 12/12/11 1414 12/13/11 1055          Assessment/Plan: s/p Procedure(s) (LRB) with comments: AORTA BIFEMORAL BYPASS GRAFT (Bilateral) - Aorta Bifemoral bypass reimplantation inferior messenteriic artery CYSTOSCOPY FLEXIBLE (N/A) - Flexible cystoscopy with foley placement. ENDARTERECTOMY FEMORAL (Right) - Right femoral endarterectomy with angioplasty Stable postop day 3. Will try clear liquids today. Continue to mobilize.   LOS: 3 days   Lee [name] 12/16/2011, 9:42 AM

## 2011-12-16 NOTE — Progress Notes (Signed)
Pt ambulated with rolling walker approx 600 feet. Tolerated well, feeling stronger today.

## 2011-12-16 NOTE — Progress Notes (Signed)
Pt ambulated with rolling walker approx. 500 feet. Tolerated well.

## 2011-12-17 MED ORDER — OXYCODONE-ACETAMINOPHEN 5-325 MG PO TABS
1.0000 | ORAL_TABLET | ORAL | Status: DC | PRN
  Administered 2011-12-17 – 2011-12-19 (×5): 2 via ORAL
  Filled 2011-12-17 (×2): qty 2
  Filled 2011-12-17: qty 1
  Filled 2011-12-17: qty 2
  Filled 2011-12-17: qty 1
  Filled 2011-12-17: qty 2

## 2011-12-17 MED ORDER — GABAPENTIN 300 MG PO CAPS
900.0000 mg | ORAL_CAPSULE | Freq: Three times a day (TID) | ORAL | Status: DC
  Administered 2011-12-17 – 2011-12-19 (×7): 900 mg via ORAL
  Filled 2011-12-17 (×9): qty 3

## 2011-12-17 MED FILL — Heparin Sodium (Porcine) Inj 1000 Unit/ML: INTRAMUSCULAR | Qty: 30 | Status: AC

## 2011-12-17 MED FILL — Sodium Chloride Irrigation Soln 0.9%: Qty: 3000 | Status: AC

## 2011-12-17 MED FILL — Sodium Chloride IV Soln 0.9%: INTRAVENOUS | Qty: 1000 | Status: AC

## 2011-12-17 NOTE — Care Management Note (Signed)
    Page 1 of 1   12/17/2011     4:17:56 PM   CARE MANAGEMENT NOTE 12/17/2011  Patient:  NOHA, KARASIK   Account Number:  [id number]  Date Initiated:  12/14/2011  Documentation initiated by:  [facility]  Subjective/Objective Assessment:   post op aorta bifem.  Has spouse     Action/Plan:   MET WITH PT AND WIFE TO DISCUSS DC PLANS.  PT REQUESTS RW FOR HOME.  NO OTHER NEEDS, PER PT AND WIFE.   Anticipated DC Date:  12/19/2011   Anticipated DC Plan:  HOME W HOME HEALTH SERVICES      DC Planning Services  CM consult      Choice offered to / List presented to:     DME arranged  Levan Hurst      DME agency  [facility]        Status of service:  Completed, signed off Medicare Important Message given?   (If response is "NO", the following Medicare IM given date fields will be blank) Date Medicare IM given:   Date Additional Medicare IM given:    Discharge Disposition:  HOME/SELF CARE  Per UR Regulation:  Reviewed for med. necessity/level of care/duration of stay  If discussed at Long Length of Stay Meetings, dates discussed:    Comments:  ContactJeovanni, [place] [phone]   [phone]   12/17/11  Random Dobrowski,RN,BSN [phone] REFERRAL TO [facility] FOR DME NEEDS.

## 2011-12-17 NOTE — Progress Notes (Addendum)
[facility] [place]  Progress Note Bypass Surgery  Date of Surgery: 12/13/2011  Procedure(s): AORTA BIFEMORAL BYPASS GRAFT CYSTOSCOPY FLEXIBLE ENDARTERECTOMY FEMORAL Surgeon: Surgeon(s): Nada Libman, MD Lindaann Slough, MD Chuck Hint, MD Nada Libman, MD  4 Days Post-Op  History of Present Illness  Lee [name] is a 61 y.o. male who is S/P Procedure(s): AORTA BIFEMORAL BYPASS GRAFT CYSTOSCOPY FLEXIBLE ENDARTERECTOMY FEMORAL bilateral.  The patient's pre-op symptoms of pain are Improved on the left and is still present on the right. Patients pain is well controlled.  Positive flatulence.  VASC. LAB Studies:        ABI: pending  Imaging: No results found.  Significant Diagnostic Studies: CBC Lab Results  Component Value Date   WBC 19.5* 12/15/2011   HGB 11.7* 12/15/2011   HCT 32.0* 12/15/2011   MCV 95.0 12/15/2011   PLT 212 12/15/2011    BMET     Component Value Date/Time   NA 129* 12/16/2011 0620   K 4.0 12/16/2011 0620   CL 97 12/16/2011 0620   CO2 22 12/16/2011 0620   GLUCOSE 81 12/16/2011 0620   BUN 12 12/16/2011 0620   CREATININE 0.72 12/16/2011 0620   CALCIUM 8.2* 12/16/2011 0620   GFRNONAA >90 12/16/2011 0620   GFRAA >90 12/16/2011 0620    COAG Lab Results  Component Value Date   INR 1.25 12/13/2011   INR 0.97 12/03/2011   INR 1.01 11/16/2011   No results found for this basename: PTT    Physical Examination  BP Readings from Last 3 Encounters:  12/17/11 144/75  12/17/11 144/75  12/05/11 139/79   Temp Readings from Last 3 Encounters:  12/17/11 98.5 F (36.9 C) Oral  12/17/11 98.5 F (36.9 C) Oral  12/03/11 98.1 F (36.7 C)    SpO2 Readings from Last 3 Encounters:  12/17/11 97%  12/17/11 97%  12/05/11 100%   Pulse Readings from Last 3 Encounters:  12/17/11 78  12/17/11 78  12/05/11 78    Pt is A&O x 3 Bilateral femoral and abdominal : Incision/s is/are clean,dry.intact, and  healing without  hematoma, erythema or drainage Limb is warm; with good color Abdomin still min. Distended without tenderness to palpation     Assessment/Plan: Pt. Doing well Post-op pain is controlled Wounds are healing well PT/OT for ambulation Increased neurontin to 900 mg TID Advance to regular diet.  Clinton Gallant [facility] [phone] 12/17/2011 7:44 AM        I agree with the above. The patient has been seen and examined. His incisions are healing nicely. He is tolerating his diet. We'll continue with ambulation and pain control with hopeful discharge in the next one to 2 days  Wells Alexea Blase

## 2011-12-17 NOTE — Progress Notes (Signed)
Occupational Therapy Treatment Patient Details Name: Lee [name] MRN: [id number] DOB: [birth year] Today's Date: 12/17/2011 Time: 7829-5621 OT Time Calculation (min): 19 min  OT Assessment / Plan / Recommendation Comments on Treatment Session Pt doing progressing well.  May benefit from tub seat for home use    Follow Up Recommendations  No OT follow up;Supervision - Intermittent;Supervision/Assistance - 24 hour    Barriers to Discharge       Equipment Recommendations  Tub/shower seat    Recommendations for Other Services    Frequency Min 2X/week   Plan Discharge plan remains appropriate    Precautions / Restrictions Precautions Precautions: Fall Restrictions Weight Bearing Restrictions: No   Pertinent Vitals/Pain     ADL  Lower Body Dressing: Min guard Where Assessed - Lower Body Dressing: Supported sit to stand Toilet Transfer: Hydrographic surveyor Method: Sit to Barista: Comfort height toilet Toileting - Architect and Hygiene: Min guard Where Assessed - Engineer, mining and Hygiene: Standing Tub/Shower Transfer: Min Buyer, retail Method: Science writer:  (stepping over the tub) Equipment Used: Rolling walker Transfers/Ambulation Related to ADLs: ambulates with min guard assist ADL Comments: Pt. may benefit from use of tub seat    OT Diagnosis:    OT Problem List:   OT Treatment Interventions:     OT Goals ADL Goals ADL Goal: Lower Body Dressing - Progress: Progressing toward goals ADL Goal: Toilet Transfer - Progress: Progressing toward goals ADL Goal: Toileting - Clothing Manipulation - Progress: Progressing toward goals ADL Goal: Tub/Shower Transfer - Progress: Progressing toward goals  Visit Information  Last OT Received On: 12/17/11 Assistance Needed: +1    Subjective Data      Prior Functioning       Cognition  Overall Cognitive Status:  Appears within functional limits for tasks assessed/performed Arousal/Alertness: Awake/alert Orientation Level: Appears intact for tasks assessed Behavior During Session: [facility] for tasks performed    Mobility  Shoulder Instructions Bed Mobility Bed Mobility: Not assessed Transfers Transfers: Sit to Stand;Stand to Sit Sit to Stand: 4: Min guard;With upper extremity assist;From bed Stand to Sit: 4: Min guard;With upper extremity assist;To bed Details for Transfer Assistance: cues for hand placement and technique.  Moves slowly due to groin pain       Exercises      Balance Static Standing Balance Static Standing - Balance Support: Bilateral upper extremity supported;During functional activity Static Standing - Level of Assistance: 5: Stand by assistance Static Standing - Comment/# of Minutes: >5 min   End of Session OT - End of Session Activity Tolerance: Patient tolerated treatment well Patient left: in bed;with call bell/phone within reach (EOB)  GO     Ailee Pates M 12/17/2011, 4:03 PM

## 2011-12-17 NOTE — Progress Notes (Signed)
VASCULAR LAB PRELIMINARY  ARTERIAL  ABI completed: Bilateral ABIs indicates severe reduction in arterial blood flow.    RIGHT    LEFT    PRESSURE WAVEFORM  PRESSURE WAVEFORM  BRACHIAL 155   BRACHIAL    DP   DP    AT 71 DM AT 77 DM  PT 76 DM PT 79 DM  PER   PER    GREAT TOE  NA GREAT TOE  NA    RIGHT LEFT  ABI 0.49 0.51     Saveah Bahar, RVT 12/17/2011, 9:59 AM

## 2011-12-17 NOTE — Progress Notes (Signed)
Physical Therapy Treatment Patient Details Name: Lee [name] MRN: [id number] DOB: 22-Jul-1950 Today's Date: 12/17/2011 Time: 4098-1191 PT Time Calculation (min): 24 min  PT Assessment / Plan / Recommendation Comments on Treatment Session  Pt s/p aorto bifemoral bypass graft and femoral endarterectomy.  Will benefit from PT to address endurance and balance.  Still appropriate for home with HHPT with RW and 24 hour assist by wife.      Follow Up Recommendations  No PT follow up;Supervision/Assistance - 24 hour     Does the patient have the potential to tolerate intense rehabilitation     Barriers to Discharge        Equipment Recommendations  Rolling walker with 5" wheels    Recommendations for Other Services    Frequency Min 3X/week   Plan Discharge plan remains appropriate;Frequency remains appropriate    Precautions / Restrictions Precautions Precautions: Fall Restrictions Weight Bearing Restrictions: No   Pertinent Vitals/Pain VSS, No pain    Mobility  Bed Mobility Bed Mobility: Not assessed Transfers Transfers: Sit to Stand;Stand to Sit Sit to Stand: With upper extremity assist;From bed;4: Min guard Stand to Sit: With upper extremity assist;4: Min guard;To bed Details for Transfer Assistance: cues for hand placement and technique.  Moves slowly due to groin pain Ambulation/Gait Ambulation/Gait Assistance: 4: Min guard Ambulation Distance (Feet): 350 Feet Assistive device: Rolling walker Ambulation/Gait Assistance Details: Ambulated several feet without the RW but unsteady secondary to LE weakness and pain.  Obtained RW and pt much safer with RW.  Occasional assist to steer RW.  Needed cues for upright posture and safety with RW as well.   Gait Pattern: Step-to pattern;Decreased stride length;Trunk flexed;Wide base of support;Shuffle Stairs: Yes Stairs Assistance: 4: Min assist Stairs Assistance Details (indicate cue type and reason): Practiced up and down  steps with RW and rail and with RW only.  Pt prefers to go up backwards and demonstrates ability to perform safely with supervision.  Pt can instruct caregiver in going up and down steps.   Stair Management Technique: One rail Left;With walker;Forwards;Backwards;Step to pattern Number of Stairs: 3  Wheelchair Mobility Wheelchair Mobility: No    PT Goals Acute Rehab PT Goals PT Goal: Sit to Stand - Progress: Progressing toward goal PT Goal: Ambulate - Progress: Progressing toward goal PT Goal: Up/Down Stairs - Progress: Progressing toward goal  Visit Information  Last PT Received On: 12/17/11 Assistance Needed: +1    Subjective Data  Subjective: "I am getting better."   Cognition  Overall Cognitive Status: Appears within functional limits for tasks assessed/performed Arousal/Alertness: Awake/alert Orientation Level: Appears intact for tasks assessed Behavior During Session: [facility] for tasks performed    Balance  Static Standing Balance Static Standing - Balance Support: Bilateral upper extremity supported;During functional activity Static Standing - Level of Assistance: 5: Stand by assistance Static Standing - Comment/# of Minutes: >5 min  End of Session PT - End of Session Equipment Utilized During Treatment: Gait belt Activity Tolerance: Patient tolerated treatment well Patient left: in bed;with call bell/phone within reach (sitting on side of bed) Nurse Communication: Mobility status        INGOLD,Quanta Robertshaw 12/17/2011, 1:41 PM  [facility] [phone] (pager)

## 2011-12-18 DIAGNOSIS — Z48812 Encounter for surgical aftercare following surgery on the circulatory system: Secondary | ICD-10-CM

## 2011-12-18 LAB — CBC
Hemoglobin: 9.4 g/dL — ABNORMAL LOW (ref 13.0–17.0)
Platelets: 182 10*3/uL (ref 150–400)
RBC: 2.79 MIL/uL — ABNORMAL LOW (ref 4.22–5.81)

## 2011-12-18 MED ORDER — SODIUM CHLORIDE 0.9 % IJ SOLN
10.0000 mL | INTRAMUSCULAR | Status: DC | PRN
  Administered 2011-12-18: 10 mL

## 2011-12-18 MED ORDER — LIDOCAINE HCL 2 % EX GEL
Freq: Once | CUTANEOUS | Status: DC
  Filled 2011-12-18: qty 5

## 2011-12-18 NOTE — Progress Notes (Addendum)
[facility] [place]  Progress Note Bypass Surgery  Date of Surgery: 12/13/2011  Procedure(s): AORTA BIFEMORAL BYPASS GRAFT CYSTOSCOPY FLEXIBLE ENDARTERECTOMY FEMORAL Surgeon: Surgeon(s): Nada Libman, MD Lindaann Slough, MD Chuck Hint, MD Nada Libman, MD  5 Days Post-Op  History of Present Illness  Lee [name] is a 61 y.o. male who is S/P Procedure(s): AORTA BIFEMORAL BYPASS GRAFT CYSTOSCOPY FLEXIBLE ENDARTERECTOMY FEMORAL bilateral.  The patient's pre-op symptoms of pain  are Improved . Patients pain is well controlled.    VASC. LAB Studies:        ABI: Right 0.49, Left 0.51   Imaging: No results found.  Significant Diagnostic Studies: CBC Lab Results  Component Value Date   WBC 10.1 12/18/2011   HGB 9.4* 12/18/2011   HCT 25.2* 12/18/2011   MCV 90.3 12/18/2011   PLT 182 12/18/2011    BMET     Component Value Date/Time   NA 129* 12/16/2011 0620   K 4.0 12/16/2011 0620   CL 97 12/16/2011 0620   CO2 22 12/16/2011 0620   GLUCOSE 81 12/16/2011 0620   BUN 12 12/16/2011 0620   CREATININE 0.72 12/16/2011 0620   CALCIUM 8.2* 12/16/2011 0620   GFRNONAA >90 12/16/2011 0620   GFRAA >90 12/16/2011 0620    COAG Lab Results  Component Value Date   INR 1.25 12/13/2011   INR 0.97 12/03/2011   INR 1.01 11/16/2011   No results found for this basename: PTT    Physical Examination  BP Readings from Last 3 Encounters:  12/18/11 151/80  12/18/11 151/80  12/05/11 139/79   Temp Readings from Last 3 Encounters:  12/18/11 98.5 F (36.9 C) Oral  12/18/11 98.5 F (36.9 C) Oral  12/03/11 98.1 F (36.7 C)    SpO2 Readings from Last 3 Encounters:  12/18/11 100%  12/18/11 100%  12/05/11 100%   Pulse Readings from Last 3 Encounters:  12/18/11 81  12/18/11 81  12/05/11 78    Pt is A&O x 3 Bilateral femoral and abdominal : Incision/s is/are clean,dry.intact, and healing  without hematoma, erythema or drainage  Limb is  warm; with good color  Abdomin  Distended without tenderness to palpation    Assessment/Plan: Pt. Doing well Post-op pain is controlled Wounds are clean, dry, intact or healing well PT/OT for ambulation Patient is tolerating regular diet well Patient removed his foley catheter.  Nursing called Lindaann Slough, MD at the office and the MD on call wants to leave the foley out to see if he can independently void.   Clinton Gallant [facility] [phone] 12/18/2011 8:31 AM   the patient pulled his Foley catheter out today. We have discussed this with urology. We'll give the patient a trial of void. He needs to ambulate today. He has had a bowel movement. His incisions remained clean and dry. His Neurontin was increased yesterday. He seems to have a little less discomfort in his legs today.  Durene Cal

## 2011-12-18 NOTE — Progress Notes (Signed)
Nutrition Follow-up  Intervention:    No nutrition intervention warranted at this time RD to continue to follow  Assessment:   NGT discontinued.  Advanced to Regular diet 12/9.  PO intake 100% per flowsheet records.  Post-op pain controlled.  Diet Order:  Regular  Meds: Scheduled Meds:   . aspirin  81 mg Oral Daily  . carbamazepine  400 mg Oral TID  . chlorhexidine  15 mL Mouth Rinse BID  . gabapentin  900 mg Oral TID  . levETIRAcetam  500 mg Oral BID  . lisinopril  20 mg Oral QAC breakfast  . nicotine  14 mg Transdermal Daily  . pantoprazole  40 mg Oral Daily   Continuous Infusions:   . sodium chloride 75 mL/hr at 12/18/11 0618   PRN Meds:.alum & mag hydroxide-simeth, bisacodyl, guaiFENesin-dextromethorphan, hydrALAZINE, labetalol, metoprolol, morphine injection, ondansetron, oxyCODONE-acetaminophen, oxyCODONE-acetaminophen, phenol, sodium chloride   CMP     Component Value Date/Time   NA 129* 12/16/2011 0620   K 4.0 12/16/2011 0620   CL 97 12/16/2011 0620   CO2 22 12/16/2011 0620   GLUCOSE 81 12/16/2011 0620   BUN 12 12/16/2011 0620   CREATININE 0.72 12/16/2011 0620   CALCIUM 8.2* 12/16/2011 0620   PROT 5.6* 12/14/2011 0400   ALBUMIN 3.0* 12/14/2011 0400   AST 32 12/14/2011 0400   ALT 26 12/14/2011 0400   ALKPHOS 80 12/14/2011 0400   BILITOT 0.5 12/14/2011 0400   GFRNONAA >90 12/16/2011 0620   GFRAA >90 12/16/2011 0620     Intake/Output Summary (Last 24 hours) at 12/18/11 1000 Last data filed at 12/18/11 0437  Gross per 24 hour  Intake      0 ml  Output   1450 ml  Net  -1450 ml    Weight Status:  75.2 kg (12/8) -- stable  Estimated needs:  2000-2200 kcals, 100-110 gm protein  Nutrition Dx:  Inadequate Oral Intake, resolved  Goal:  Oral intake with meals to meet >/= 90% of estimated nutrition needs, met  Monitor:  PO intake, weight, labs, I/O's  Kirkland Hun, RD, LDN Pager #: [phone] After-Hours Pager #: [phone]

## 2011-12-18 NOTE — Progress Notes (Signed)
Order received to insert coude catheter. 16 french coude catheter placed at 1855. Patient tolerated procedure well. minimal bleeding noted from penis . Large amount of edema noted to penis prior to insertion. Blood tinged urine noted and immediately cleared to yellow urine . Continue with plan of care.                  Lee [name]

## 2011-12-18 NOTE — Progress Notes (Signed)
PT Cancellation Note  Patient Details Name: AMARRI SATTERLY MRN: [id number] DOB: 1950/01/19   Cancelled Treatment:    Reason Eval/Treat Not Completed:  ("I want to have my meds before you walk with me.")  Will check back as able.  Thanks.   INGOLD,Ahnaf Caponi 12/18/2011, 12:11 PM [facility] [phone] (pager)

## 2011-12-18 NOTE — Progress Notes (Signed)
Pt not able to void on his own. Bladder scan performed and revealed greater than 422 mL. MD notified and order received to reinsert Coude catheter.  Nurse on 4000 notified of need for Coude.

## 2011-12-18 NOTE — Progress Notes (Addendum)
NT reports that pt pulled catheter. Upon assessment of pt, moderate amount of bleeding on sheets and sweeling of penis noted. Cleaned pt up and notified MD. Received orders to allow pt to attempt to void on his own. Will continue to monitor pt.

## 2011-12-19 ENCOUNTER — Telehealth: Payer: Self-pay | Admitting: Surgery

## 2011-12-19 MED ORDER — GABAPENTIN 300 MG PO CAPS: 900.0000 mg | ORAL_CAPSULE | Freq: Three times a day (TID) | ORAL | Status: DC

## 2011-12-19 MED ORDER — OXYCODONE-ACETAMINOPHEN 5-325 MG PO TABS: 1.0000 | ORAL_TABLET | ORAL | Status: DC | PRN

## 2011-12-19 NOTE — Progress Notes (Signed)
Central line Dcd per protocol and as ordered. Pt tolerated well pressure held and dressing applied. Pt given discharge instructions and prescriptons given to pt. Instructions given using teach back method.   Pt and wife instructed on foley care and leg bag usage. All questions answered. Will discharge home as ordered. Merle Cirelli, Randall An RN

## 2011-12-19 NOTE — Discharge Summary (Signed)
[facility] Discharge Summary   Patient ID:  Lee [name] MRN: [id number] DOB/AGE: April 04, 1950 61 y.o.  Admit date: 12/13/2011 Discharge date: 12/19/2011 Date of Surgery: 12/13/2011 Surgeon: Surgeon(s): Nada Libman, MD Lindaann Slough, MD Chuck Hint, MD Nada Libman, MD  Admission Diagnosis: Peripheral Arterial Disease  Discharge Diagnoses:  Peripheral Arterial Disease  Secondary Diagnoses: Past Medical History  Diagnosis Date  . Stroke Sept. 2012  . Hypertension   . Sleep concern     per family, had one study, but told to return for follow up study but hasn't yet  . Peripheral vascular disease   . Chronic kidney disease     renal calculi  . Aneurysm of femoral artery   . COPD (chronic obstructive pulmonary disease)   . Disturbance, sleep     sleep study done at [facility], 2012- not found to has sleep apnea   . GERD (gastroesophageal reflux disease)   . Seizures     diagnosed /w epilepsy- since 1980's   . Arthritis     ankles     Procedure(s): AORTA BIFEMORAL BYPASS GRAFT CYSTOSCOPY FLEXIBLE ENDARTERECTOMY FEMORAL  Discharged Condition: good  HPI: Patient comes in today for further evaluation of his peripheral vascular disease. Patient began having pain in both of his feet approximately 3-4 months ago. [facility] for further diagnostic imaging and evaluation. There he was found to have ankle-brachial indices and the 0.3 range and was scheduled for angiogram this past week. Angiographic findings include bilateral iliac artery occlusion. Severe common femoral disease bilaterally. The right superficial femoral artery is occluded. Bilateral popliteal arteries are occluded. Diffuse tibial disease bilaterally. He appears to have single-vessel runoff on the right via the anterior tibial artery. All 3 vessels appear to be patent on the left. There is diminutive peroneal on  the right. The patient reports constant pain in both his feet. This does keep him up at night. He is limited to walking less than 50 feet before his legs give out and clamp.  The patient long-standing history of smoking. He is trying to quit. He suffers from hypertension which is managed with an ACE inhibitor. He also has a seizure disorder. His last seizure was approximately 6 months ago. He does have a history of stroke. He has no residual deficits.     Hospital Course:  Lee [name] is a 61 y.o. male is S/P Bilateral Procedure(s): AORTA BIFEMORAL BYPASS GRAFT CYSTOSCOPY FLEXIBLE ENDARTERECTOMY FEMORAL Extubated: POD # 0 Post-op wounds healing well Pt. Ambulating, voiding and taking PO diet without difficulty. Pt pain controlled with PO pain meds. Labs as below Complications:Dr. Brabham were unable to insert a Foley catheter. I was asked to see him in consultation for catheter insertion.   Consults: Lindaann Slough, MD     Significant Diagnostic Studies: CBC Lab Results  Component Value Date   WBC 10.1 12/18/2011   HGB 9.4* 12/18/2011   HCT 25.2* 12/18/2011   MCV 90.3 12/18/2011   PLT 182 12/18/2011    BMET    Component Value Date/Time   NA 129* 12/16/2011 0620   K 4.0 12/16/2011 0620   CL 97 12/16/2011 0620   CO2 22 12/16/2011 0620   GLUCOSE 81 12/16/2011 0620   BUN 12 12/16/2011 0620   CREATININE 0.72 12/16/2011 0620   CALCIUM 8.2* 12/16/2011 0620   GFRNONAA >90 12/16/2011 0620   GFRAA >90 12/16/2011 7829  COAG Lab Results  Component Value Date   INR 1.25 12/13/2011   INR 0.97 12/03/2011   INR 1.01 11/16/2011     Disposition:  Discharge to :Home Discharge Orders    Future Orders Please Complete By Expires   Resume previous diet      Driving Restrictions      Comments:   No driving for 6 weeks   Lifting restrictions      Comments:   No lifting for 6 weeks   Call MD for:  temperature >100.5      Call MD for:  redness, tenderness, or signs of  infection (pain, swelling, bleeding, redness, odor or green/yellow discharge around incision site)      Call MD for:  severe or increased pain, loss or decreased feeling  in affected limb(s)      Discharge instructions      Comments:   Keep foley in place until you follow up with Dr. Brunilda Payor 12-25-2011.   Increase activity slowly      Comments:   Walk with assistance use walker or cane as needed   May shower          Lyndall, Windt  Home Medication Instructions [id number]   Printed on:12/19/11 [phone]  Medication Information                    levETIRAcetam (KEPPRA) 500 MG tablet Take 500 mg by mouth 2 (two) times daily.            aspirin 81 MG tablet Take 81 mg by mouth daily.           vitamin E 400 UNIT capsule Take 400 Units by mouth daily.           Iron-Vitamins (GERITOL COMPLETE PO) Take 1 tablet by mouth daily.           Naphazoline HCl (CLEAR EYES OP) Apply 2 drops to eye 3 (three) times daily as needed. For dry eyes           lisinopril (PRINIVIL,ZESTRIL) 20 MG tablet Take 1 tablet by mouth daily before breakfast.            oxyCODONE-acetaminophen (PERCOCET/ROXICET) 5-325 MG per tablet Take 1-2 tablets by mouth every 6 (six) hours as needed. For pain           oxyCODONE-acetaminophen (PERCOCET/ROXICET) 5-325 MG per tablet Take 1 tablet by mouth every 6 (six) hours as needed for pain.           carbamazepine (EPITOL) 200 MG tablet Take 400 mg by mouth 3 (three) times daily.           gabapentin (NEURONTIN) 300 MG capsule Take 3 capsules (900 mg total) by mouth 3 (three) times daily.           oxyCODONE-acetaminophen (PERCOCET/ROXICET) 5-325 MG per tablet Take 1-2 tablets by mouth every 4 (four) hours as needed.            Verbal and written Discharge instructions given to the patient. Wound care per Discharge AVS Follow-up Information    Follow up with NESI,MARC-HENRY, MD. On 12/25/2011. (830 am)    Contact information:   [address]  [place] [address] [phone]       Follow up with Basilio Cairo, Lala Lund, MD. In 4  weeks. (sent)    Contact information:   [address] [place] [address] [phone]          Signed: Clinton Gallant [facility] 12/19/2011, 8:38 AM

## 2011-12-19 NOTE — Progress Notes (Signed)
Occupational Therapy Treatment Patient Details Name: Lee [name] MRN: [id number] DOB: [birth year] Today's Date: 12/19/2011 Time: 4098-1191 OT Time Calculation (min): 29 min  OT Assessment / Plan / Recommendation Comments on Treatment Session Pt progressing well. Would benefit from the use of a tub seat although pt states he will likely stand & shower. Recommended pt have SBA from family if doing this.    Follow Up Recommendations  No OT follow up;Supervision - Intermittent    Barriers to Discharge       Equipment Recommendations  Tub/shower seat    Recommendations for Other Services    Frequency Min 2X/week   Plan Discharge plan remains appropriate    Precautions / Restrictions Precautions Precautions: Fall Restrictions Weight Bearing Restrictions: No   Pertinent Vitals/Pain Pt denied pain.    ADL  Upper Body Bathing: Performed;Set up Where Assessed - Upper Body Bathing: Unsupported sitting Lower Body Bathing: Performed;Set up Where Assessed - Lower Body Bathing: Unsupported sit to stand Upper Body Dressing: Performed;Set up Where Assessed - Upper Body Dressing: Unsupported sitting Lower Body Dressing: Performed;Set up Where Assessed - Lower Body Dressing: Unsupported sitting;Other (comment) (to don/doff socks.) Toilet Transfer: Buyer, retail Method: Sit to stand ADL Comments: Pt tolerated standing position to bathe LB for ~8 min without getting fatigued.    OT Diagnosis:    OT Problem List:   OT Treatment Interventions:     OT Goals ADL Goals ADL Goal: Upper Body Bathing - Progress: Met ADL Goal: Lower Body Bathing - Progress: Progressing toward goals ADL Goal: Upper Body Dressing - Progress: Met ADL Goal: Lower Body Dressing - Progress: Progressing toward goals ADL Goal: Toilet Transfer - Progress: Progressing toward goals  Visit Information  Last OT Received On: 12/19/11 Assistance Needed: +1    Subjective Data  Subjective: I hope I'm getting out of here.   Prior Functioning       Cognition  Overall Cognitive Status: Appears within functional limits for tasks assessed/performed Arousal/Alertness: Awake/alert Orientation Level: Appears intact for tasks assessed Behavior During Session: [facility] for tasks performed    Mobility  Shoulder Instructions Bed Mobility Bed Mobility: Rolling Left;Left Sidelying to Sit Rolling Right: Not tested (comment) Rolling Left: 7: Independent Right Sidelying to Sit: Not tested (comment) Left Sidelying to Sit: 7: Independent Sitting - Scoot to Edge of Bed: 7: Independent Details for Bed Mobility Assistance: No cues needed. Transfers Sit to Stand: 5: Supervision;From bed;With upper extremity assist Stand to Sit: 5: Supervision;To bed;With upper extremity assist Details for Transfer Assistance: Doing better everytime patient works with PT       Exercises      Balance Static Standing Balance Static Standing - Balance Support: No upper extremity supported;During functional activity Static Standing - Level of Assistance: 5: Stand by assistance Static Standing - Comment/# of Minutes: >5 minutes at sink   End of Session OT - End of Session Activity Tolerance: Patient tolerated treatment well Patient left: in bed;with call bell/phone within reach  GO     [employer] A OTR/L [phone] 12/19/2011, 11:52 AM

## 2011-12-19 NOTE — Telephone Encounter (Signed)
Message copied by Rosalyn Charters on Wed Dec 19, 2011  9:52 AM ------      Message from: Melene Plan      Created: Wed Dec 19, 2011  9:29 AM                   ----- Message -----         From: Lars Mage, PA         Sent: 12/19/2011   8:35 AM           To: Melene Plan, RN            F/U with Dr. Myra Gianotti in 4 weeks Aorta-by -fem .

## 2011-12-19 NOTE — Progress Notes (Addendum)
Physical Therapy Treatment Patient Details Name: Lee [name] MRN: [id number] DOB: [birth year] Today's Date: 12/19/2011 Time: 4098-1191 PT Time Calculation (min): 30 min  PT Assessment / Plan / Recommendation Comments on Treatment Session  Pt. s/p aorto bifemoral bypass graft and femoral endarterectomy.  Will benefit from PT to address endurance and balance.  Still appropriate for home with HHPT with RW and 24 hour assist by wife.      Follow Up Recommendations  Home health PT;Supervision/Assistance - 24 hour     Does the patient have the potential to tolerate intense rehabilitation     Barriers to Discharge        Equipment Recommendations  Rolling walker with 5" wheels    Recommendations for Other Services    Frequency Min 3X/week   Plan Frequency remains appropriate;Discharge plan needs to be updated    Precautions / Restrictions Precautions Precautions: Fall Restrictions Weight Bearing Restrictions: No   Pertinent Vitals/Pain VSS, No pain    Mobility  Bed Mobility Bed Mobility: Rolling Left;Left Sidelying to Sit Rolling Right: Not tested (comment) Rolling Left: 7: Independent Right Sidelying to Sit: Not tested (comment) Left Sidelying to Sit: 7: Independent Sitting - Scoot to Edge of Bed: 7: Independent Details for Bed Mobility Assistance: No cues needed. Transfers Sit to Stand: 4: Min guard;With upper extremity assist;From bed Stand to Sit: 4: Min guard;With upper extremity assist;To bed Details for Transfer Assistance: Doing better everytime patient works with PT Ambulation/Gait Ambulation/Gait Assistance: 4: Min guard Ambulation Distance (Feet): 400 Feet Assistive device: Rolling walker Ambulation/Gait Assistance Details: Ambulated several feet without the RW with better stability however will be best to use the RW at all times at present level.  Pt agrees.  Stands more upright with less cuing.   Gait Pattern: Step-to pattern;Decreased stride  length Gait velocity: decreased Stairs: Yes Stairs Assistance: 4: Min guard Stairs Assistance Details (indicate cue type and reason): Practiced up and down steps with RW only and with rail only.  Pt states he will use the rail most likely as his family will be with him when he goes out and can move the RW.  He does understand how to perform stairs with RW and was given a handout as well.  Pt can instruct caregiver in up and down steps.   Stair Management Technique: One rail Right;Step to pattern;Forwards;Backwards;With walker Number of Stairs: 3; gave handout for stairs with RW Wheelchair Mobility Wheelchair Mobility: No     PT Goals Acute Rehab PT Goals PT Goal: Supine/Side to Sit - Progress: Progressing toward goal PT Goal: Sit to Stand - Progress: Progressing toward goal PT Goal: Ambulate - Progress: Progressing toward goal PT Goal: Up/Down Stairs - Progress: Progressing toward goal  Visit Information  Last PT Received On: 12/19/11 Assistance Needed: +1    Subjective Data  Subjective: "I feel better."   Cognition  Overall Cognitive Status: Appears within functional limits for tasks assessed/performed Arousal/Alertness: Awake/alert Orientation Level: Appears intact for tasks assessed Behavior During Session: [facility] for tasks performed    Balance  Static Standing Balance Static Standing - Balance Support: Bilateral upper extremity supported;During functional activity Static Standing - Level of Assistance: 6: Modified independent (Device/Increase time) Static Standing - Comment/# of Minutes: >5 minutes at sink  End of Session PT - End of Session Equipment Utilized During Treatment: Gait belt Activity Tolerance: Patient tolerated treatment well Patient left: in bed;with call bell/phone within reach (sitting on side of bed) Nurse Communication: Mobility status  [name],Lee Felmlee 12/19/2011, 11:10 AM  [facility] [phone] (pager)

## 2011-12-19 NOTE — Progress Notes (Signed)
[facility] [place]  Progress Note Bypass Surgery  Date of Surgery: 12/13/2011  Procedure(s): AORTA BIFEMORAL BYPASS GRAFT CYSTOSCOPY FLEXIBLE ENDARTERECTOMY FEMORAL Surgeon: Surgeon(s): Nada Libman, MD Lindaann Slough, MD Chuck Hint, MD Nada Libman, MD  6 Days Post-Op  History of Present Illness  Lee [name] is a 61 y.o. male who is S/P Procedure(s): AORTA BIFEMORAL BYPASS GRAFT CYSTOSCOPY FLEXIBLE ENDARTERECTOMY FEMORAL bilateral.  The patient's pre-op symptoms of pain are Improved . Patients pain is well controlled.    VASC. LAB Studies:        ABI: Right 0.49, Left 0.51    Imaging: No results found.  Significant Diagnostic Studies: CBC Lab Results  Component Value Date   WBC 10.1 12/18/2011   HGB 9.4* 12/18/2011   HCT 25.2* 12/18/2011   MCV 90.3 12/18/2011   PLT 182 12/18/2011    BMET     Component Value Date/Time   NA 129* 12/16/2011 0620   K 4.0 12/16/2011 0620   CL 97 12/16/2011 0620   CO2 22 12/16/2011 0620   GLUCOSE 81 12/16/2011 0620   BUN 12 12/16/2011 0620   CREATININE 0.72 12/16/2011 0620   CALCIUM 8.2* 12/16/2011 0620   GFRNONAA >90 12/16/2011 0620   GFRAA >90 12/16/2011 0620    COAG Lab Results  Component Value Date   INR 1.25 12/13/2011   INR 0.97 12/03/2011   INR 1.01 11/16/2011   No results found for this basename: PTT    Physical Examination  BP Readings from Last 3 Encounters:  12/19/11 150/79  12/19/11 150/79  12/05/11 139/79   Temp Readings from Last 3 Encounters:  12/19/11 98.2 F (36.8 C) Oral  12/19/11 98.2 F (36.8 C) Oral  12/03/11 98.1 F (36.7 C)    SpO2 Readings from Last 3 Encounters:  12/19/11 100%  12/19/11 100%  12/05/11 100%   Pulse Readings from Last 3 Encounters:  12/19/11 92  12/19/11 92  12/05/11 78    Pt is A&O x 3 bilateral extremity: Incision/s is/are clean,dry.intact, and  healing without hematoma, erythema or drainage Limb is warm; with good  color Palpable DP bilateral feet.  Right foot is not as sensitivity to touch has improved and is less painful.  Bilateral feet with min. Edema. Scrotal swelling s/p insertion of foley.    Assessment/Plan: Pt. Doing well Post-op pain is controlled Wounds are healing well PT/OT for ambulation Foley reinserted by nursing staff. Maintain foley until follow up visit 12-25-2011 8:30am for Foley removal.  Clinton Gallant [facility] [phone] 12/19/2011 8:09 AM

## 2011-12-26 ENCOUNTER — Other Ambulatory Visit: Payer: Self-pay

## 2011-12-26 DIAGNOSIS — G8918 Other acute postprocedural pain: Secondary | ICD-10-CM

## 2011-12-26 MED ORDER — OXYCODONE-ACETAMINOPHEN 5-325 MG PO TABS: 1.0000 | ORAL_TABLET | Freq: Four times a day (QID) | ORAL | Status: DC | PRN

## 2011-12-26 NOTE — Progress Notes (Signed)
Pt. Called to request refill on Percocet.  States has level "7" pain on 1-10 scale.  Denies any redness, warmth or drainage in incisional areas.  Denies fever.  C/o swelling of feet and lower legs.  Encouraged pt. To elevate feet and legs several times/ day, above the level of the heart.   Verb. Understanding.  Discussed request for Percocet refill with Azalia Bilis, RN, NP.  Rec'd verbal order for Percocet 5/325 mg, 1-2 tabs q 6 hrs/ prn/ pain; #30; no refills.  Pt. Advised prescription will be ready to be picked-up at the office.

## 2011-12-31 NOTE — Discharge Summary (Signed)
I agree with the above  Lee [name] 

## 2012-01-03 ENCOUNTER — Telehealth: Payer: Self-pay

## 2012-01-03 NOTE — Telephone Encounter (Signed)
Wife called to report pt. Has a foul-smelling drainage in right groin incision.  States there is redness surrounding the right groin area.  Denies fever.  Reports that pt. Has not been eating as well, and is sleeping a lot more.  Advised to keep right groin area clean/dry, and change gauze dsg. As needed to maintain clean,dry site.  Appt. Given for 10:40 AM 01/04/12.  Agrees with plan.

## 2012-01-04 ENCOUNTER — Ambulatory Visit (INDEPENDENT_AMBULATORY_CARE_PROVIDER_SITE_OTHER): Payer: Medicare Other | Admitting: Neurosurgery

## 2012-01-04 ENCOUNTER — Encounter: Payer: Self-pay | Admitting: Neurosurgery

## 2012-01-04 VITALS — BP 133/83 | HR 87 | Temp 97.2°F | Resp 16 | Ht 68.0 in | Wt 146.0 lb

## 2012-01-04 DIAGNOSIS — I739 Peripheral vascular disease, unspecified: Secondary | ICD-10-CM

## 2012-01-04 DIAGNOSIS — M79609 Pain in unspecified limb: Secondary | ICD-10-CM

## 2012-01-04 DIAGNOSIS — T148XXA Other injury of unspecified body region, initial encounter: Secondary | ICD-10-CM

## 2012-01-04 MED ORDER — CIPROFLOXACIN HCL 500 MG PO TABS: 500.0000 mg | ORAL_TABLET | Freq: Two times a day (BID) | ORAL | Status: DC

## 2012-01-04 NOTE — Progress Notes (Signed)
Subjective:     Patient ID: Lee [name], male   DOB: [birth year], 61 y.o.   MRN: [id number]  HPI: 61 year old male patient of Dr. Myra Gianotti who is status post  #1: Aortobifemoral bypass using a 14 x 7 dacryon graft  #2: Right common femoral endarterectomy  #3: Reimplantation of the inferior mesenteric artery The patient called the office asking to be seen due to some "drainage" in his right groin and was brought in for evaluation. The patient denies pain although he is slightly sore from the surgery itself. He has no other complaints at this time and appears to be doing well.   Review of Systems: 12 point review of systems is notable for the difficulties described above otherwise unremarkable     Objective:   Physical Exam: Afebrile, vital signs are stable, his surgical wound is well-healed, his right groin is healed there is a small amount of clear yellow drainage but I cannot express more than about 1 cc. The right groin puncture wound appears to be healing it is slightly red in the center but there is no surrounding redness and there is a palpable hematoma/seroma that appears to be about 6 cm in induration.     Assessment:     Healing surgical wounds with a mild seroma or hematoma most likely seroma due to the fact that there is some clear yellow drainage. I explained to the patient and his wife that this should self-correct but if not he should call the office when he notices a difference or if things worsen. The patient and his wife state understanding. I did remove the rest of the Dermabond on the surgical wound which greatly improved his appearance.    Plan:     The patient will followup in 2 weeks with Dr. Myra Gianotti as scheduled he knows to call the office if he has difficulties in the interim. I did start the patient on Cipro 500 mg twice a day for 10 days as he has a penicillin allergy. Again the patient and his wife are in agreement with this, his questions were encouraged and  answered.  Lauree Chandler ANP  Clinic M.D.: Imogene Burn

## 2012-01-18 ENCOUNTER — Encounter: Payer: Self-pay | Admitting: Surgery

## 2012-01-21 ENCOUNTER — Encounter: Payer: Self-pay | Admitting: Surgery

## 2012-01-21 ENCOUNTER — Other Ambulatory Visit: Payer: Self-pay | Admitting: *Deleted

## 2012-01-21 ENCOUNTER — Ambulatory Visit: Payer: Medicare Other | Admitting: Surgery

## 2012-01-21 ENCOUNTER — Encounter: Payer: Self-pay | Admitting: *Deleted

## 2012-01-21 ENCOUNTER — Encounter (HOSPITAL_COMMUNITY): Payer: Self-pay | Admitting: Pharmacy Technician

## 2012-01-21 ENCOUNTER — Ambulatory Visit (INDEPENDENT_AMBULATORY_CARE_PROVIDER_SITE_OTHER): Payer: Medicare Other | Admitting: Surgery

## 2012-01-21 VITALS — BP 129/80 | HR 73 | Ht 68.0 in | Wt 147.7 lb

## 2012-01-21 DIAGNOSIS — I739 Peripheral vascular disease, unspecified: Secondary | ICD-10-CM
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
## 2012-01-22 ENCOUNTER — Encounter (HOSPITAL_COMMUNITY)
Admission: RE | Disposition: A | Discharge status: Home Health | Payer: Self-pay | Source: Ambulatory Visit | Attending: Surgery

## 2012-01-22 ENCOUNTER — Ambulatory Visit (HOSPITAL_COMMUNITY): Payer: Medicare Other

## 2012-01-22 ENCOUNTER — Encounter (HOSPITAL_COMMUNITY): Payer: Self-pay | Admitting: Critical Care Medicine

## 2012-01-22 ENCOUNTER — Inpatient Hospital Stay (HOSPITAL_COMMUNITY)
Admission: RE | Admit: 2012-01-22 | Discharge: 2012-01-29 | DRG: 863 | Disposition: A | Discharge status: Home Health | Payer: Medicare Other | Source: Ambulatory Visit | Attending: Surgery | Admitting: Surgery

## 2012-01-22 ENCOUNTER — Ambulatory Visit (HOSPITAL_COMMUNITY): Payer: Medicare Other | Admitting: Critical Care Medicine

## 2012-01-22 ENCOUNTER — Encounter (HOSPITAL_COMMUNITY): Payer: Self-pay | Admitting: General Practice

## 2012-01-22 ENCOUNTER — Encounter (HOSPITAL_COMMUNITY): Payer: Self-pay | Admitting: *Deleted

## 2012-01-22 DIAGNOSIS — Y92009 Unspecified place in unspecified non-institutional (private) residence as the place of occurrence of the external cause: Secondary | ICD-10-CM

## 2012-01-22 DIAGNOSIS — Z7982 Long term (current) use of aspirin: Secondary | ICD-10-CM

## 2012-01-22 DIAGNOSIS — Z23 Encounter for immunization: Secondary | ICD-10-CM

## 2012-01-22 DIAGNOSIS — Z79899 Other long term (current) drug therapy: Secondary | ICD-10-CM

## 2012-01-22 DIAGNOSIS — T827XXA Infection and inflammatory reaction due to other cardiac and vascular devices, implants and grafts, initial encounter: Secondary | ICD-10-CM | POA: Diagnosis present

## 2012-01-22 DIAGNOSIS — Y832 Surgical operation with anastomosis, bypass or graft as the cause of abnormal reaction of the patient, or of later complication, without mention of misadventure at the time of the procedure: Secondary | ICD-10-CM | POA: Diagnosis present

## 2012-01-22 DIAGNOSIS — A4901 Methicillin susceptible Staphylococcus aureus infection, unspecified site: Secondary | ICD-10-CM | POA: Diagnosis present

## 2012-01-22 DIAGNOSIS — Z823 Family history of stroke: Secondary | ICD-10-CM

## 2012-01-22 DIAGNOSIS — G40309 Generalized idiopathic epilepsy and epileptic syndromes, not intractable, without status epilepticus: Secondary | ICD-10-CM | POA: Diagnosis present

## 2012-01-22 DIAGNOSIS — Z8673 Personal history of transient ischemic attack (TIA), and cerebral infarction without residual deficits: Secondary | ICD-10-CM

## 2012-01-22 DIAGNOSIS — J449 Chronic obstructive pulmonary disease, unspecified: Secondary | ICD-10-CM | POA: Diagnosis present

## 2012-01-22 DIAGNOSIS — K219 Gastro-esophageal reflux disease without esophagitis: Secondary | ICD-10-CM | POA: Diagnosis present

## 2012-01-22 DIAGNOSIS — Z8249 Family history of ischemic heart disease and other diseases of the circulatory system: Secondary | ICD-10-CM

## 2012-01-22 DIAGNOSIS — I724 Aneurysm of artery of lower extremity: Secondary | ICD-10-CM | POA: Diagnosis present

## 2012-01-22 DIAGNOSIS — Z888 Allergy status to other drugs, medicaments and biological substances status: Secondary | ICD-10-CM

## 2012-01-22 DIAGNOSIS — T8140XA Infection following a procedure, unspecified, initial encounter: Principal | ICD-10-CM | POA: Diagnosis present

## 2012-01-22 DIAGNOSIS — J4489 Other specified chronic obstructive pulmonary disease: Secondary | ICD-10-CM | POA: Diagnosis present

## 2012-01-22 DIAGNOSIS — Z87891 Personal history of nicotine dependence: Secondary | ICD-10-CM

## 2012-01-22 DIAGNOSIS — G8918 Other acute postprocedural pain: Secondary | ICD-10-CM

## 2012-01-22 DIAGNOSIS — I10 Essential (primary) hypertension: Secondary | ICD-10-CM | POA: Diagnosis present

## 2012-01-22 DIAGNOSIS — I70229 Atherosclerosis of native arteries of extremities with rest pain, unspecified extremity: Secondary | ICD-10-CM | POA: Diagnosis present

## 2012-01-22 DIAGNOSIS — Z880 Allergy status to penicillin: Secondary | ICD-10-CM

## 2012-01-22 HISTORY — DX: Other generalized epilepsy and epileptic syndromes, not intractable, without status epilepticus: G40.409

## 2012-01-22 HISTORY — DX: Calculus of kidney: N20.0

## 2012-01-22 HISTORY — PX: I & D EXTREMITY: SHX5045

## 2012-01-22 HISTORY — PX: INCISION AND DRAINAGE: SHX5863

## 2012-01-22 LAB — URINALYSIS, ROUTINE W REFLEX MICROSCOPIC
Bilirubin Urine: NEGATIVE
Glucose, UA: NEGATIVE mg/dL
Hgb urine dipstick: NEGATIVE
Specific Gravity, Urine: 1.021 (ref 1.005–1.030)
Urobilinogen, UA: 0.2 mg/dL (ref 0.0–1.0)
pH: 7 (ref 5.0–8.0)

## 2012-01-22 LAB — COMPREHENSIVE METABOLIC PANEL
ALT: 13 U/L (ref 0–53)
AST: 17 U/L (ref 0–37)
Albumin: 2.8 g/dL — ABNORMAL LOW (ref 3.5–5.2)
Alkaline Phosphatase: 84 U/L (ref 39–117)
BUN: 6 mg/dL (ref 6–23)
Chloride: 99 mEq/L (ref 96–112)
Potassium: 3.8 mEq/L (ref 3.5–5.1)
Sodium: 135 mEq/L (ref 135–145)
Total Bilirubin: 0.2 mg/dL — ABNORMAL LOW (ref 0.3–1.2)
Total Protein: 5.9 g/dL — ABNORMAL LOW (ref 6.0–8.3)

## 2012-01-22 LAB — CBC
HCT: 35.7 % — ABNORMAL LOW (ref 39.0–52.0)
MCHC: 35.3 g/dL (ref 30.0–36.0)
Platelets: 249 10*3/uL (ref 150–400)
RDW: 13.8 % (ref 11.5–15.5)
WBC: 5.6 10*3/uL (ref 4.0–10.5)

## 2012-01-22 LAB — POCT I-STAT, CHEM 8
BUN: 6 mg/dL (ref 6–23)
Calcium, Ion: 1.22 mmol/L (ref 1.13–1.30)
Creatinine, Ser: 0.9 mg/dL (ref 0.50–1.35)
Hemoglobin: 13.9 g/dL (ref 13.0–17.0)
Sodium: 135 mEq/L (ref 135–145)
TCO2: 29 mmol/L (ref 0–100)

## 2012-01-22 LAB — PROTIME-INR: Prothrombin Time: 13.9 seconds (ref 11.6–15.2)

## 2012-01-22 LAB — SURGICAL PCR SCREEN: Staphylococcus aureus: NEGATIVE

## 2012-01-22 SURGERY — IRRIGATION AND DEBRIDEMENT EXTREMITY
Anesthesia: General | Site: Leg Upper | Laterality: Right | Wound class: Dirty or Infected

## 2012-01-22 MED ORDER — DEXTROSE 5 % IV SOLN
INTRAVENOUS | Status: DC | PRN
  Administered 2012-01-22: 13:00:00 via INTRAVENOUS

## 2012-01-22 MED ORDER — LACTATED RINGERS IV SOLN
INTRAVENOUS | Status: DC
  Administered 2012-01-22: 12:00:00 via INTRAVENOUS

## 2012-01-22 MED ORDER — LABETALOL HCL 5 MG/ML IV SOLN
10.0000 mg | INTRAVENOUS | Status: DC | PRN
  Filled 2012-01-22: qty 4

## 2012-01-22 MED ORDER — CARBAMAZEPINE 200 MG PO TABS
400.0000 mg | ORAL_TABLET | Freq: Three times a day (TID) | ORAL | Status: DC
  Administered 2012-01-22 – 2012-01-29 (×21): 400 mg via ORAL
  Filled 2012-01-22 (×23): qty 2

## 2012-01-22 MED ORDER — ZOLPIDEM TARTRATE 5 MG PO TABS: 5.0000 mg | ORAL_TABLET | Freq: Every evening | ORAL | Status: DC | PRN

## 2012-01-22 MED ORDER — GABAPENTIN 300 MG PO CAPS
600.0000 mg | ORAL_CAPSULE | Freq: Three times a day (TID) | ORAL | Status: DC
  Administered 2012-01-22 – 2012-01-29 (×21): 600 mg via ORAL
  Filled 2012-01-22 (×23): qty 2

## 2012-01-22 MED ORDER — SODIUM CHLORIDE 0.9 % IR SOLN
Status: DC | PRN
  Administered 2012-01-22: 14:00:00

## 2012-01-22 MED ORDER — MIDAZOLAM HCL 5 MG/5ML IJ SOLN
INTRAMUSCULAR | Status: DC | PRN
  Administered 2012-01-22: 2 mg via INTRAVENOUS

## 2012-01-22 MED ORDER — SODIUM CHLORIDE 0.9 % IV SOLN: INTRAVENOUS | Status: DC

## 2012-01-22 MED ORDER — MUPIROCIN 2 % EX OINT
TOPICAL_OINTMENT | CUTANEOUS | Status: AC
  Filled 2012-01-22: qty 22

## 2012-01-22 MED ORDER — NICOTINE 21 MG/24HR TD PT24
21.0000 mg | MEDICATED_PATCH | Freq: Every day | TRANSDERMAL | Status: DC
  Administered 2012-01-22 – 2012-01-29 (×8): 21 mg via TRANSDERMAL
  Filled 2012-01-22 (×8): qty 1

## 2012-01-22 MED ORDER — ONDANSETRON HCL 4 MG/2ML IJ SOLN
INTRAMUSCULAR | Status: DC | PRN
  Administered 2012-01-22: 4 mg via INTRAVENOUS

## 2012-01-22 MED ORDER — METOPROLOL TARTRATE 1 MG/ML IV SOLN: 2.0000 mg | INTRAVENOUS | Status: DC | PRN

## 2012-01-22 MED ORDER — OXYCODONE-ACETAMINOPHEN 5-325 MG PO TABS
1.0000 | ORAL_TABLET | ORAL | Status: DC | PRN
  Administered 2012-01-22: 2 via ORAL
  Administered 2012-01-22: 1 via ORAL
  Administered 2012-01-23 – 2012-01-29 (×17): 2 via ORAL
  Filled 2012-01-22 (×20): qty 2

## 2012-01-22 MED ORDER — PHENOL 1.4 % MT LIQD: 1.0000 | OROMUCOSAL | Status: DC | PRN

## 2012-01-22 MED ORDER — CEFAZOLIN SODIUM 1-5 GM-% IV SOLN
INTRAVENOUS | Status: AC
  Filled 2012-01-22: qty 50

## 2012-01-22 MED ORDER — ARTIFICIAL TEARS OP OINT
TOPICAL_OINTMENT | OPHTHALMIC | Status: DC | PRN
  Administered 2012-01-22: 1 via OPHTHALMIC

## 2012-01-22 MED ORDER — PANTOPRAZOLE SODIUM 40 MG PO TBEC
40.0000 mg | DELAYED_RELEASE_TABLET | Freq: Every day | ORAL | Status: DC
  Administered 2012-01-23 – 2012-01-29 (×7): 40 mg via ORAL
  Filled 2012-01-22 (×7): qty 1

## 2012-01-22 MED ORDER — LEVETIRACETAM 750 MG PO TABS
750.0000 mg | ORAL_TABLET | Freq: Two times a day (BID) | ORAL | Status: DC
  Administered 2012-01-22 – 2012-01-29 (×14): 750 mg via ORAL
  Filled 2012-01-22 (×15): qty 1

## 2012-01-22 MED ORDER — POTASSIUM CHLORIDE CRYS ER 20 MEQ PO TBCR: 20.0000 meq | EXTENDED_RELEASE_TABLET | Freq: Once | ORAL | Status: DC

## 2012-01-22 MED ORDER — PROPOFOL 10 MG/ML IV BOLUS
INTRAVENOUS | Status: DC | PRN
  Administered 2012-01-22: 150 mg via INTRAVENOUS

## 2012-01-22 MED ORDER — LIDOCAINE HCL (CARDIAC) 20 MG/ML IV SOLN
INTRAVENOUS | Status: DC | PRN
  Administered 2012-01-22: 60 mg via INTRAVENOUS

## 2012-01-22 MED ORDER — ONDANSETRON HCL 4 MG/2ML IJ SOLN: 4.0000 mg | Freq: Four times a day (QID) | INTRAMUSCULAR | Status: DC | PRN

## 2012-01-22 MED ORDER — PIPERACILLIN-TAZOBACTAM 3.375 G IVPB
3.3750 g | Freq: Three times a day (TID) | INTRAVENOUS | Status: DC
  Administered 2012-01-22 – 2012-01-29 (×21): 3.375 g via INTRAVENOUS
  Filled 2012-01-22 (×25): qty 50

## 2012-01-22 MED ORDER — FENTANYL CITRATE 0.05 MG/ML IJ SOLN
INTRAMUSCULAR | Status: DC | PRN
  Administered 2012-01-22: 100 ug via INTRAVENOUS

## 2012-01-22 MED ORDER — LISINOPRIL 20 MG PO TABS
20.0000 mg | ORAL_TABLET | Freq: Every day | ORAL | Status: DC
  Administered 2012-01-23 – 2012-01-29 (×7): 20 mg via ORAL
  Filled 2012-01-22 (×8): qty 1

## 2012-01-22 MED ORDER — HYDRALAZINE HCL 20 MG/ML IJ SOLN
10.0000 mg | INTRAMUSCULAR | Status: DC | PRN
  Filled 2012-01-22: qty 0.5

## 2012-01-22 MED ORDER — ASPIRIN 81 MG PO CHEW
81.0000 mg | CHEWABLE_TABLET | Freq: Every day | ORAL | Status: DC
  Administered 2012-01-22 – 2012-01-29 (×8): 81 mg via ORAL
  Filled 2012-01-22 (×8): qty 1

## 2012-01-22 MED ORDER — HYDROMORPHONE HCL PF 1 MG/ML IJ SOLN: 0.2500 mg | INTRAMUSCULAR | Status: DC | PRN

## 2012-01-22 MED ORDER — SODIUM CHLORIDE 0.9 % IV SOLN
INTRAVENOUS | Status: DC
  Administered 2012-01-22: 20 mL/h via INTRAVENOUS

## 2012-01-22 MED ORDER — VANCOMYCIN HCL IN DEXTROSE 1-5 GM/200ML-% IV SOLN
1000.0000 mg | Freq: Two times a day (BID) | INTRAVENOUS | Status: DC
  Administered 2012-01-23 – 2012-01-25 (×6): 1000 mg via INTRAVENOUS
  Filled 2012-01-22 (×8): qty 200

## 2012-01-22 MED ORDER — MUPIROCIN 2 % EX OINT
TOPICAL_OINTMENT | Freq: Once | CUTANEOUS | Status: AC
  Administered 2012-01-22: 1 via NASAL
  Filled 2012-01-22: qty 22

## 2012-01-22 MED ORDER — MORPHINE SULFATE 2 MG/ML IJ SOLN
2.0000 mg | INTRAMUSCULAR | Status: DC | PRN
  Administered 2012-01-23 – 2012-01-24 (×3): 2 mg via INTRAVENOUS
  Administered 2012-01-27: 4 mg via INTRAVENOUS
  Administered 2012-01-28 (×3): 2 mg via INTRAVENOUS
  Filled 2012-01-22 (×4): qty 1
  Filled 2012-01-22: qty 2
  Filled 2012-01-22 (×2): qty 1

## 2012-01-22 MED ORDER — INFLUENZA VIRUS VACC SPLIT PF IM SUSP
0.5000 mL | INTRAMUSCULAR | Status: AC
  Administered 2012-01-23: 0.5 mL via INTRAMUSCULAR
  Filled 2012-01-22: qty 0.5

## 2012-01-22 MED ORDER — VANCOMYCIN HCL 10 G IV SOLR
1500.0000 mg | INTRAVENOUS | Status: AC
  Administered 2012-01-22: 1500 mg via INTRAVENOUS
  Filled 2012-01-22: qty 1500

## 2012-01-22 MED ORDER — PNEUMOCOCCAL VAC POLYVALENT 25 MCG/0.5ML IJ INJ
0.5000 mL | INJECTION | INTRAMUSCULAR | Status: AC
  Administered 2012-01-23: 0.5 mL via INTRAMUSCULAR
  Filled 2012-01-22: qty 0.5

## 2012-01-22 MED ORDER — GUAIFENESIN-DM 100-10 MG/5ML PO SYRP: 15.0000 mL | ORAL_SOLUTION | ORAL | Status: DC | PRN

## 2012-01-22 MED ORDER — PHENYLEPHRINE HCL 10 MG/ML IJ SOLN
INTRAMUSCULAR | Status: DC | PRN
  Administered 2012-01-22: 80 ug via INTRAVENOUS
  Administered 2012-01-22: 120 ug via INTRAVENOUS

## 2012-01-22 MED ORDER — 0.9 % SODIUM CHLORIDE (POUR BTL) OPTIME
TOPICAL | Status: DC | PRN
  Administered 2012-01-22: 1000 mL

## 2012-01-22 MED ORDER — ALUM & MAG HYDROXIDE-SIMETH 200-200-20 MG/5ML PO SUSP: 15.0000 mL | ORAL | Status: DC | PRN

## 2012-01-22 SURGICAL SUPPLY — 43 items
BANDAGE ELASTIC 4 VELCRO ST LF (GAUZE/BANDAGES/DRESSINGS) IMPLANT
BANDAGE ELASTIC 6 VELCRO ST LF (GAUZE/BANDAGES/DRESSINGS) IMPLANT
BANDAGE GAUZE ELAST BULKY 4 IN (GAUZE/BANDAGES/DRESSINGS) IMPLANT
CANISTER SUCTION 2500CC (MISCELLANEOUS) ×2 IMPLANT
CLIP TI MEDIUM 6 (CLIP) ×1 IMPLANT
CLIP TI WIDE RED SMALL 6 (CLIP) ×1 IMPLANT
CLOTH BEACON ORANGE TIMEOUT ST (SAFETY) ×2 IMPLANT
COVER SURGICAL LIGHT HANDLE (MISCELLANEOUS) ×2 IMPLANT
DRAPE EXTREMITY BILATERAL (DRAPE) IMPLANT
DRAPE EXTREMITY T 121X128X90 (DRAPE) IMPLANT
DRAPE INCISE IOBAN 66X45 STRL (DRAPES) ×1 IMPLANT
DRAPE U-SHAPE 76X120 STRL (DRAPES) IMPLANT
ELECT REM PT RETURN 9FT ADLT (ELECTROSURGICAL) ×2
ELECTRODE REM PT RTRN 9FT ADLT (ELECTROSURGICAL) ×1 IMPLANT
GAUZE XEROFORM 5X9 LF (GAUZE/BANDAGES/DRESSINGS) IMPLANT
GLOVE BIOGEL PI IND STRL 7.5 (GLOVE) ×1 IMPLANT
GLOVE BIOGEL PI IND STRL 8.5 (GLOVE) IMPLANT
GLOVE BIOGEL PI INDICATOR 7.5 (GLOVE) ×2
GLOVE BIOGEL PI INDICATOR 8.5 (GLOVE) ×1
GLOVE SURG SS PI 7.5 STRL IVOR (GLOVE) ×3 IMPLANT
GLOVE SURG SS PI 8.0 STRL IVOR (GLOVE) ×1 IMPLANT
GOWN PREVENTION PLUS XLARGE (GOWN DISPOSABLE) ×2 IMPLANT
GOWN PREVENTION PLUS XXLARGE (GOWN DISPOSABLE) ×2 IMPLANT
GOWN STRL NON-REIN LRG LVL3 (GOWN DISPOSABLE) ×3 IMPLANT
KIT BASIN OR (CUSTOM PROCEDURE TRAY) ×2 IMPLANT
KIT ROOM TURNOVER OR (KITS) ×2 IMPLANT
NS IRRIG 1000ML POUR BTL (IV SOLUTION) ×2 IMPLANT
PACK CV ACCESS (CUSTOM PROCEDURE TRAY) IMPLANT
PACK GENERAL/GYN (CUSTOM PROCEDURE TRAY) ×1 IMPLANT
PACK UNIVERSAL I (CUSTOM PROCEDURE TRAY) IMPLANT
PAD ARMBOARD 7.5X6 YLW CONV (MISCELLANEOUS) ×4 IMPLANT
SPONGE GAUZE 4X4 12PLY (GAUZE/BANDAGES/DRESSINGS) ×2 IMPLANT
STAPLER VISISTAT 35W (STAPLE) IMPLANT
SUT ETHILON 3 0 PS 1 (SUTURE) IMPLANT
SUT VIC AB 2-0 CTX 36 (SUTURE) IMPLANT
SUT VIC AB 3-0 SH 27 (SUTURE)
SUT VIC AB 3-0 SH 27X BRD (SUTURE) IMPLANT
SUT VICRYL 4-0 PS2 18IN ABS (SUTURE) IMPLANT
TAPE CLOTH SURG 4X10 WHT LF (GAUZE/BANDAGES/DRESSINGS) ×1 IMPLANT
TOWEL OR 17X24 6PK STRL BLUE (TOWEL DISPOSABLE) ×2 IMPLANT
TOWEL OR 17X26 10 PK STRL BLUE (TOWEL DISPOSABLE) ×2 IMPLANT
TUBE ANAEROBIC SPECIMEN COL (MISCELLANEOUS) ×1 IMPLANT
WATER STERILE IRR 1000ML POUR (IV SOLUTION) ×2 IMPLANT

## 2012-01-22 NOTE — Progress Notes (Signed)
01/22/12 0929  OBSTRUCTIVE SLEEP APNEA  Have you ever been diagnosed with sleep apnea through a sleep study? No  Do you snore loudly (loud enough to be heard through closed doors)?  1  Do you often feel tired, fatigued, or sleepy during the daytime? 1  Has anyone observed you stop breathing during your sleep? 0  Do you have, or are you being treated for high blood pressure? 1  Age over 63 years old? 1  Neck circumference greater than 40 cm/18 inches? 0  Obstructive Sleep Apnea Score 4   Score 4 or greater  Results sent to PCP

## 2012-01-22 NOTE — Transfer of Care (Signed)
Immediate Anesthesia Transfer of Care Note  Patient: Lee [name]  Procedure(s) Performed: Procedure(s) (LRB) with comments: IRRIGATION AND DEBRIDEMENT EXTREMITY (Right) - GROIN  Patient Location: PACU  Anesthesia Type:General  Level of Consciousness: awake, alert  and oriented  Airway & Oxygen Therapy: Patient Spontanous Breathing and Patient connected to nasal cannula oxygen  Post-op Assessment: Report given to PACU RN, Post -op Vital signs reviewed and stable and Patient moving all extremities X 4  Post vital signs: Reviewed and stable  Complications: No apparent anesthesia complications

## 2012-01-22 NOTE — Anesthesia Preprocedure Evaluation (Addendum)
Anesthesia Evaluation  Patient identified by MRN, date of birth, ID band Patient awake    Reviewed: Allergy & Precautions, H&P , NPO status , Patient's Chart, lab work & pertinent test results  Airway Mallampati: II TM Distance: >3 FB Neck ROM: Full    Dental No notable dental hx. (+) Edentulous Upper and Dental Advisory Given   Pulmonary COPD breath sounds clear to auscultation  Pulmonary exam normal       Cardiovascular hypertension, On Medications + Peripheral Vascular Disease Rhythm:Regular Rate:Normal     Neuro/Psych Seizures -,  CVA negative psych ROS   GI/Hepatic negative GI ROS, Neg liver ROS, GERD-  ,  Endo/Other  negative endocrine ROS  Renal/GU Renal diseasenegative Renal ROS  negative genitourinary   Musculoskeletal   Abdominal   Peds  Hematology negative hematology ROS (+)   Anesthesia Other Findings   Reproductive/Obstetrics negative OB ROS                          Anesthesia Physical Anesthesia Plan  ASA: III  Anesthesia Plan: General   Post-op Pain Management:    Induction: Intravenous  Airway Management Planned: LMA  Additional Equipment:   Intra-op Plan:   Post-operative Plan: Extubation in OR  Informed Consent: I have reviewed the patients History and Physical, chart, labs and discussed the procedure including the risks, benefits and alternatives for the proposed anesthesia with the patient or authorized representative who has indicated his/her understanding and acceptance.   Dental advisory given  Plan Discussed with: CRNA  Anesthesia Plan Comments:         Anesthesia Quick Evaluation
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
## 2012-01-22 NOTE — Anesthesia Postprocedure Evaluation (Signed)
  Anesthesia Post-op Note  Patient: Lee [name]  Procedure(s) Performed: Procedure(s) (LRB) with comments: IRRIGATION AND DEBRIDEMENT EXTREMITY (Right) - Irrigation and Debridement of Right Groin  Patient Location: PACU  Anesthesia Type:General  Level of Consciousness: awake and alert   Airway and Oxygen Therapy: Patient Spontanous Breathing and Patient connected to nasal cannula oxygen  Post-op Pain: none  Post-op Assessment: Post-op Vital signs reviewed, Patient's Cardiovascular Status Stable, Respiratory Function Stable, Patent Airway and No signs of Nausea or vomiting  Post-op Vital Signs: Reviewed and stable  Complications: No apparent anesthesia complications

## 2012-01-22 NOTE — Preoperative (Signed)
Beta Blockers   Reason not to administer Beta Blockers:Not Applicable 

## 2012-01-22 NOTE — Progress Notes (Signed)
ANTIBIOTIC CONSULT NOTE - INITIAL  Pharmacy Consult for Vancomycin and Zosyn Indication: Post-op R groin infection (s/p aorta by fem 12/5)  Allergies  Allergen Reactions  . Penicillins Nausea And Vomiting  . Vicodin (Hydrocodone-Acetaminophen)     Patient Measurements: Height: 5\' 8"  (172.7 cm) Weight: 145 lb 1 oz (65.8 kg) IBW/kg (Calculated) : 68.4   Vital Signs: Temp: 98 F (36.7 C) (01/14 1334) Temp src: Oral (01/14 0846) BP: 153/86 mmHg (01/14 1430) Pulse Rate: 73  (01/14 1430) Intake/Output from previous day:   Intake/Output from this shift: Total I/O In: 700 [I.V.:700] Out: 5 [Blood:5]  Labs:  [facility] 01/22/12 0920  WBC --  HGB 13.9  PLT --  LABCREA --  CREATININE 0.90   Estimated Creatinine Clearance: 79.2 ml/min (by C-G formula based on Cr of 0.9). No results found for this basename: VANCOTROUGH:2,VANCOPEAK:2,VANCORANDOM:2,GENTTROUGH:2,GENTPEAK:2,GENTRANDOM:2,TOBRATROUGH:2,TOBRAPEAK:2,TOBRARND:2,AMIKACINPEAK:2,AMIKACINTROU:2,AMIKACIN:2, in the last 72 hours   Microbiology: Recent Results (from the past 720 hour(s))  SURGICAL PCR SCREEN     Status: Normal   Collection Time   01/22/12  9:06 AM      Component Value Range Status Comment   MRSA, PCR NEGATIVE  NEGATIVE Final    Staphylococcus aureus NEGATIVE  NEGATIVE Final     Medical History: Past Medical History  Diagnosis Date  . Stroke Sept. 2012  . Hypertension   . Sleep concern     per family, had one study, but told to return for follow up study but hasn't yet  . Peripheral vascular disease   . Chronic kidney disease     renal calculi  . Aneurysm of femoral artery   . COPD (chronic obstructive pulmonary disease)   . Disturbance, sleep     sleep study done at [facility], 2012- not found to has sleep apnea   . GERD (gastroesophageal reflux disease)   . Seizures     diagnosed /w epilepsy- since 1980's   . Arthritis     ankles     Medications:  Prescriptions prior to admission    Medication Sig Dispense Refill  . acetaminophen (TYLENOL) 325 MG tablet Take 325-650 mg by mouth every 6 (six) hours as needed. For pain.      [name] aspirin 81 MG tablet Take 81 mg by mouth daily.      . carbamazepine (EPITOL) 200 MG tablet Take 400 mg by mouth 3 (three) times daily.      . ciprofloxacin (CIPRO) 500 MG tablet Take 1 tablet (500 mg total) by mouth 2 (two) times daily.  20 tablet  0  . gabapentin (NEURONTIN) 300 MG capsule Take 600 mg by mouth 3 (three) times daily.      . Iron-Vitamins (GERITOL COMPLETE PO) Take 1 tablet by mouth daily.      [name] levETIRAcetam (KEPPRA) 750 MG tablet Take 750 mg by mouth every 12 (twelve) hours.      [name] lisinopril (PRINIVIL,ZESTRIL) 20 MG tablet Take 1 tablet by mouth daily before breakfast.       . oxyCODONE-acetaminophen (PERCOCET/ROXICET) 5-325 MG per tablet Take 1-2 tablets by mouth every 6 (six) hours as needed.  30 tablet  0  . vitamin E 400 UNIT capsule Take 400 Units by mouth daily.       Assessment: 62 y.o. male presents s/p aortobifemoral bypass graft 12/13/11 - now with R groin infection. S/p I&D today. To continue Vancomycin and Zosyn post-op. Received Vanco 1.5gm IV x 1 pre-op today at 1230.  Goal of Therapy:  Vancomycin trough level 10-15  mcg/ml  Plan:  1. Vancomycin 1gm IV q12h. Next dose tonight at 2400 2. Continue Zosyn 3.375gm IV q8h. 3. Will f/u microbiological data, renal function, pt's clinical condition  Lavonia Dana 01/22/2012,3:03 PM

## 2012-01-22 NOTE — Progress Notes (Signed)
Pt. Stated he stopped using marijuana 4 months ago.

## 2012-01-22 NOTE — Op Note (Signed)
[facility] [place]  Patient name: Lee [name] MRN: [id number] DOB: 18-Jul-1950 Sex: male  01/22/2012 Pre-operative Diagnosis: Right groin wound Post-operative diagnosis:  Same Surgeon:  Jorge Ny Assistants:  Narda Amber Procedure:   I&D the right groin (skin and subcutaneous tissue) Anesthesia:  Gen. Blood Loss:  See anesthesia record Specimens:  Cultures of the wound were sent to microbiology  Findings:  No gross purulence was identified. The tissue all appeared to be healthy. The wound it did extend down to the anterior side of the dacryon graft. The graft appeared to be well incorporated with the exception of the anterior surface.  Indications:  The patient is previously undergone aortobifemoral bypass grafting. He was seen in the office yesterday and found to have drainage from his right groin incision. A Q-tip was placed which tracked rather deep, and therefore I felt this needed to be explored in the operating room.  Procedure:  The patient was identified in the holding area and taken to [facility] OR ROOM 12  The patient was then placed supine on the table. general anesthesia was administered.  The patient was prepped and draped in the usual sterile fashion.  A time out was called and antibiotics were administered.  A probe was placed into the wound which went approximately 1-1/2 cm. I took cultures of this area using anaerobic and aerobic Q-tips. I then used a 10 blade to cut down to the probe which had been inserted through the small opening. Cautery was then used to open the remaining portion of the incision. The tunnel tract down to the anterior side of the dacryon graft which was exposed for approximately 1 cm. I did not identify any purulence. The tissue all appeared to be healthy. The graft appeared to be incorporated around its edges. I irrigated the wound with polymyxin and bacitracin. The wound was then packed with Betadine soaked gauze. The patient  tolerated the procedure well. There were no complications.   Disposition:  To PACU in stable condition.   Juleen China, M.D. [facility] [place] Office: [phone] Pager:  [phone]

## 2012-01-22 NOTE — Progress Notes (Signed)
Pulses doppled but felt on and off 1+

## 2012-01-22 NOTE — Anesthesia Procedure Notes (Signed)
Procedure Name: LMA Insertion Date/Time: 01/22/2012 12:35 PM Performed by: Tyrone Nine Pre-anesthesia Checklist: Patient identified, Timeout performed, Emergency Drugs available, Suction available and Patient being monitored Patient Re-evaluated:Patient Re-evaluated prior to inductionOxygen Delivery Method: Circle system utilized Preoxygenation: Pre-oxygenation with 100% oxygen Intubation Type: IV induction Ventilation: Mask ventilation with difficulty LMA: LMA inserted LMA Size: 5.0 Number of attempts: 1 Placement Confirmation: positive ETCO2 Tube secured with: Tape

## 2012-01-22 NOTE — Interval H&P Note (Signed)
History and Physical Interval Note:  01/22/2012 11:05 AM  Lee [name]  has presented today for surgery, with the diagnosis of INFECTED WOUND  The various methods of treatment have been discussed with the patient and family. After consideration of risks, benefits and other options for treatment, the patient has consented to  Procedure(s) (LRB) with comments: IRRIGATION AND DEBRIDEMENT EXTREMITY (Right) - GROIN as a surgical intervention .  The patient's history has been reviewed, patient examined, no change in status, stable for surgery.  I have reviewed the patient's chart and labs.  Questions were answered to the patient's satisfaction.     [name], V. WELLS

## 2012-01-23 NOTE — Progress Notes (Signed)
Utilization review completed.  P.J. Kiren Mcisaac,RN,BSN Case Manager [phone]  

## 2012-01-23 NOTE — Progress Notes (Signed)
Left groin wound clean, exposed dacron at base of wound.  Granulation healthy in appearance. Will consider VAC soon.  Follow up cultures.  Fabienne Bruns, MD [facility] [place] Office: [phone] Pager: [phone]

## 2012-01-24 ENCOUNTER — Encounter (HOSPITAL_COMMUNITY): Payer: Self-pay | Admitting: Surgery

## 2012-01-24 NOTE — Care Management Note (Signed)
    Page 1 of 2   01/30/2012     1:15:19 PM   CARE MANAGEMENT NOTE 01/30/2012  Patient:  Lee [name], Lee [name]   Account Number:  [id number]  Date Initiated:  01/24/2012  Documentation initiated by:  Rosaleah Person  Subjective/Objective Assessment:   PT ADM WITH RT GROIN GRAFT INFECTION.  PTA, PT INDEPENDENT, LIVES WITH SPOUSE.     Action/Plan:   WILL FOLLOW FOR HOME NEEDS.  VAC PLACED TODAY.  WILL PLACE [facility] HOME VAC FORMS ON CHART SHOULD VAC BE NEEDED AT HOME.   Anticipated DC Date:  01/29/2012   Anticipated DC Plan:  HOME W HOME HEALTH SERVICES      DC Planning Services  CM consult      [facility] Choice  HOME HEALTH   Choice offered to / List presented to:  C-1 Patient   DME arranged  VAC      DME agency  [facility]     HH arranged  HH-1 RN  HH-2 PT      [facility] agency  [facility]   Status of service:  Completed, signed off Medicare Important Message given?   (If response is "NO", the following Medicare IM given date fields will be blank) Date Medicare IM given:   Date Additional Medicare IM given:    Discharge Disposition:  HOME W HOME HEALTH SERVICES  Per UR Regulation:  Reviewed for med. necessity/level of care/duration of stay  If discussed at Long Length of Stay Meetings, dates discussed:    Comments:  01/29/12 Rosalita Chessman [phone] PT FOR DC HOME TODAY.  HOME WOUND VAC DELIVERED TO HOSP ROOM 1/20 PM.  [facility] NOTIFIED OF PT DISCHARGING TODAY.  START OF CARE 24-48H POST DC DATE.

## 2012-01-24 NOTE — Consult Note (Signed)
WOC consult Note Reason for Consult: Consult requested for application of vac to right groin wound. Wound type: Post-op full thickness Measurement:5X2X3cm Wound bed: 100% beefy red.  Wound located over graft site. Drainage (amount, consistency, odor) Small pink drainage, no odor. Periwound: Intact skin surrounding. Dressing procedure/placement/frequency: Applied Mepitel contact layer to protect graft site, then one piece black foam to cont suction.  Pt tolerated with minimal discomfort.  Applied small piece of barrier to inner groin to maintain seal on vac.  Bedside nurse can change Q Tues/Thurs/Sat.  Cammie Mcgee, RN, MSN, [employer]  [phone]

## 2012-01-24 NOTE — Progress Notes (Addendum)
[facility] [place]  Subjective  - POD #2  I and D left groin with exposed graft.    Objective 144/66 91 99.5 F (37.5 C) (Oral) 18 100%  Intake/Output Summary (Last 24 hours) at 01/24/12 0758 Last data filed at 01/24/12 0600  Gross per 24 hour  Intake    710 ml  Output   2650 ml  Net  -1940 ml   Cultures no organisms seen. Distally feet warm to touch.   Left groin exposed graft.     Assessment/Planning: POD #2 Will place wound vac once graft is no longer visible  Continue wet to dry dressing changes TID. Continue antibiotics.  Clinton Gallant [facility] 01/24/2012 7:58 AM --  Laboratory Lab Results:  Basename 01/22/12 1729 01/22/12 0920  WBC 5.6 --  HGB 12.6* 13.9  HCT 35.7* 41.0  PLT 249 --   BMET  Basename 01/22/12 1729 01/22/12 0920  NA 135 135  K 3.8 4.3  CL 99 98  CO2 25 --  GLUCOSE 119* 91  BUN 6 6  CREATININE 0.68 0.90  CALCIUM 8.9 --    COAG Lab Results  Component Value Date   INR 1.08 01/22/2012   INR 1.25 12/13/2011   INR 0.97 12/03/2011   No results found for this basename: PTT    Antibiotics Anti-infectives     Start     Dose/Rate Route Frequency Ordered Stop   01/22/12 2359   vancomycin (VANCOCIN) IVPB 1000 mg/200 mL premix        1,000 mg 200 mL/hr over 60 Minutes Intravenous Every 12 hours 01/22/12 1515     01/22/12 1600  piperacillin-tazobactam (ZOSYN) IVPB 3.375 g       3.375 g 12.5 mL/hr over 240 Minutes Intravenous Every 8 hours 01/22/12 1452     01/22/12 1332   polymyxin B 500,000 Units, bacitracin 50,000 Units in sodium chloride irrigation 0.9 % 500 mL irrigation  Status:  Discontinued          As needed 01/22/12 1333 01/22/12 1342   01/22/12 0857   vancomycin (VANCOCIN) 1,500 mg in sodium chloride 0.9 % 500 mL IVPB        1,500 mg 250 mL/hr over 120 Minutes Intravenous 120 min pre-op 01/22/12 0857 01/22/12 1230             Wound is clean.  I believe risk of graft infection with  dressing changes outweighs VAC risk.  Will place VAC today.  Fabienne Bruns, MD [facility] [place] Office: [phone] Pager: [phone]

## 2012-01-25 LAB — VANCOMYCIN, TROUGH: Vancomycin Tr: 7.2 ug/mL — ABNORMAL LOW (ref 10.0–20.0)

## 2012-01-25 MED ORDER — VANCOMYCIN HCL 10 G IV SOLR
1250.0000 mg | Freq: Two times a day (BID) | INTRAVENOUS | Status: DC
  Administered 2012-01-26 – 2012-01-29 (×8): 1250 mg via INTRAVENOUS
  Filled 2012-01-25 (×9): qty 1250

## 2012-01-25 NOTE — Progress Notes (Addendum)
[facility] [place]  Progress Note Bypass Surgery  Date of Surgery: 01/22/2012  Procedure(s): IRRIGATION AND DEBRIDEMENT EXTREMITY Surgeon: Surgeon(s): Nada Libman, MD  3 Days Post-Op  History of Present Illness  Lee [name] is a 62 y.o. male who is S/P Procedure(s): IRRIGATION AND DEBRIDEMENT EXTREMITY right.  The patient's pre-op symptoms of infection with drainage are Improved . Patients pain is well controlled.      Imaging: No results found.  Significant Diagnostic Studies: CBC Lab Results  Component Value Date   WBC 5.6 01/22/2012   HGB 12.6* 01/22/2012   HCT 35.7* 01/22/2012   MCV 95.2 01/22/2012   PLT 249 01/22/2012    BMET     Component Value Date/Time   NA 135 01/22/2012 1729   K 3.8 01/22/2012 1729   CL 99 01/22/2012 1729   CO2 25 01/22/2012 1729   GLUCOSE 119* 01/22/2012 1729   BUN 6 01/22/2012 1729   CREATININE 0.68 01/22/2012 1729   CALCIUM 8.9 01/22/2012 1729   GFRNONAA >90 01/22/2012 1729   GFRAA >90 01/22/2012 1729    COAG Lab Results  Component Value Date   INR 1.08 01/22/2012   INR 1.25 12/13/2011   INR 0.97 12/03/2011   No results found for this basename: PTT    Physical Examination  BP Readings from Last 3 Encounters:  01/25/12 133/80  01/25/12 133/80  01/21/12 129/80   Temp Readings from Last 3 Encounters:  01/25/12 99.3 F (37.4 C) Oral  01/25/12 99.3 F (37.4 C) Oral  01/04/12 97.2 F (36.2 C) Oral   SpO2 Readings from Last 3 Encounters:  01/25/12 99%  01/25/12 99%  01/21/12 100%   Pulse Readings from Last 3 Encounters:  01/25/12 81  01/25/12 81  01/21/12 73    Pt is A&O x 3 right lower extremity: Incision/s is/are clean,dry.intact, and  Draining min. Wound vac in place and working properly or healing without hematoma, erythema  Limb is warm; with good color     Assessment/Plan: Pt. Doing well Post-op pain is controlled Wounds are clean with wound vac in place Continue  antibiotic Continue wound care as ordered  Clinton Gallant [facility] [phone] 01/25/2012 8:04 AM   VAC in place right groin, no episodes of bleeding Will recheck with next VAC change.  Fabienne Bruns, MD [facility] [place] Office: [phone] Pager: [phone]

## 2012-01-25 NOTE — Progress Notes (Addendum)
Pharmacy:  Vancomycin dosing Vanc trough 7.2 mg/L (goal 10-15 mg/L) Will increase dose to 1250 mg IV q12 hours. Thanks for allowing pharmacy to be a part of this patient's care.  Talbert Cage, PharmD Clinical Pharmacist, [phone]

## 2012-01-25 NOTE — Progress Notes (Signed)
ANTIBIOTIC CONSULT NOTE - follow up Pharmacy Consult for Vancomycin and Zosyn Indication: Post-op R groin infection (s/p aorta by fem 12/5)  Allergies  Allergen Reactions  . Penicillins Nausea And Vomiting    "might have been an overdose" (01/22/2012)  . Vicodin (Hydrocodone-Acetaminophen) Other (See Comments)    "triggered seizure both here and at home when he tried to take it" (01/22/2012)    Patient Measurements: Height: 5\' 8"  (172.7 cm) Weight: 146 lb 2.6 oz (66.3 kg) IBW/kg (Calculated) : 68.4   Vital Signs: Temp: 99.3 F (37.4 C) (01/17 0656) Temp src: Oral (01/17 0656) BP: 133/80 mmHg (01/17 0656) Pulse Rate: 81  (01/17 0656) Intake/Output from previous day: 01/16 0701 - 01/17 0700 In: 480 [P.O.:480] Out: 1960 [Urine:1950; Drains:10] Intake/Output from this shift: Total I/O In: 240 [P.O.:240] Out: 400 [Urine:400]  Labs:  [facility] 01/22/12 1729  WBC 5.6  HGB 12.6*  PLT 249  LABCREA --  CREATININE 0.68   Estimated Creatinine Clearance: 89.8 ml/min (by C-G formula based on Cr of 0.68). No results found for this basename: VANCOTROUGH:2,VANCOPEAK:2,VANCORANDOM:2,GENTTROUGH:2,GENTPEAK:2,GENTRANDOM:2,TOBRATROUGH:2,TOBRAPEAK:2,TOBRARND:2,AMIKACINPEAK:2,AMIKACINTROU:2,AMIKACIN:2, in the last 72 hours   Microbiology: Recent Results (from the past 720 hour(s))  SURGICAL PCR SCREEN     Status: Normal   Collection Time   01/22/12  9:06 AM      Component Value Range Status Comment   MRSA, PCR NEGATIVE  NEGATIVE Final    Staphylococcus aureus NEGATIVE  NEGATIVE Final   WOUND CULTURE     Status: Normal (Preliminary result)   Collection Time   01/22/12  1:47 PM      Component Value Range Status Comment   Specimen Description WOUND GROIN RIGHT   Final    Special Requests PT ON VANCO   Final    Gram Stain     Final    Value: RARE WBC PRESENT,BOTH PMN AND MONONUCLEAR     RARE SQUAMOUS EPITHELIAL CELLS PRESENT     NO ORGANISMS SEEN   Culture RARE STAPHYLOCOCCUS AUREUS    Final    Report Status PENDING   Incomplete   ANAEROBIC CULTURE     Status: Normal (Preliminary result)   Collection Time   01/22/12  1:47 PM      Component Value Range Status Comment   Specimen Description WOUND GROIN RIGHT   Final    Special Requests PT ON VANCO   Final    Gram Stain     Final    Value: RARE WBC PRESENT,BOTH PMN AND MONONUCLEAR     RARE SQUAMOUS EPITHELIAL CELLS PRESENT     NO ORGANISMS SEEN   Culture     Final    Value: NO ANAEROBES ISOLATED; CULTURE IN PROGRESS FOR 5 DAYS   Report Status PENDING   Incomplete      Assessment: 62 y.o. male presents s/p aortobifemoral bypass graft 12/13/11 -  with R groin infection. S/p I&D 01/22/12. Wound VAC placed 01/24/12. T max 99.3. WBC on 1/14 = 5.6 Creat 1/14 = 0.68.   Vancomycin and Zosyn day #4.   1/14 groin wound - rare staph aureus   Goal of Therapy:  Vancomycin trough level 10-15 mcg/ml  Plan:  1. Continue Vancomycin 1gm IV q12h.  2. Continue Zosyn 3.375gm IV q8h. 3. Vancomycin trough tonight prior to midnight dose to assess regimen. Herby Abraham, Pharm.D. [phone] 01/25/2012 12:30 PM

## 2012-01-26 LAB — WOUND CULTURE

## 2012-01-26 NOTE — Progress Notes (Addendum)
[facility] [place]  Subjective  - POD #5 days post I&D right groin.  Wound vac in place and working.  Antibiotics ordered daily IV.  Objective 129/77 75 98.7 F (37.1 C) (Oral) 16 98%  Intake/Output Summary (Last 24 hours) at 01/26/12 0902 Last data filed at 01/26/12 0506  Gross per 24 hour  Intake    890 ml  Output   3160 ml  Net  -2270 ml    Right PT palpable skin warm and dry. Wound vac in place and working well.  Assessment/Planning: POD #5   I&D right groin. Infected aorta fem-fem graft. Continue wound vac call Dr. Darrick Penna when wound vac is changed.  Clinton Gallant [facility] 01/26/2012 9:02 AM --  Laboratory Lab Results: No results found for this basename: WBC:2,HGB:2,HCT:2,PLT:2 in the last 72 hours BMET No results found for this basename: NA:2,K:2,CL:2,CO2:2,GLUCOSE:2,BUN:2,CREATININE:2,CALCIUM:2 in the last 72 hours  COAG Lab Results  Component Value Date   INR 1.08 01/22/2012   INR 1.25 12/13/2011   INR 0.97 12/03/2011   No results found for this basename: PTT    Antibiotics Anti-infectives     Start     Dose/Rate Route Frequency Ordered Stop   01/26/12 0000   vancomycin (VANCOCIN) 1,250 mg in sodium chloride 0.9 % 250 mL IVPB        1,250 mg 166.7 mL/hr over 90 Minutes Intravenous Every 12 hours 01/25/12 2338     01/22/12 2359   vancomycin (VANCOCIN) IVPB 1000 mg/200 mL premix  Status:  Discontinued        1,000 mg 200 mL/hr over 60 Minutes Intravenous Every 12 hours 01/22/12 1515 01/25/12 2338   01/22/12 1600  piperacillin-tazobactam (ZOSYN) IVPB 3.375 g       3.375 g 12.5 mL/hr over 240 Minutes Intravenous Every 8 hours 01/22/12 1452     01/22/12 1332   polymyxin B 500,000 Units, bacitracin 50,000 Units in sodium chloride irrigation 0.9 % 500 mL irrigation  Status:  Discontinued          As needed 01/22/12 1333 01/22/12 1342   01/22/12 0857   vancomycin (VANCOCIN) 1,500 mg in sodium chloride 0.9 % 500 mL IVPB        1,500 mg 250 mL/hr over 120 Minutes Intravenous 120 min pre-op 01/22/12 0857 01/22/12 1230          History and exam details as above VAC in place right groin Dr Myra Gianotti to resume care on Monday.  Fabienne Bruns, MD [facility] [place] Office: [phone] Pager: [phone]

## 2012-01-27 LAB — ANAEROBIC CULTURE

## 2012-01-27 NOTE — Progress Notes (Addendum)
[facility] [place]  Subjective  - POD #6 post I&D right groin.   Wound vac in place and working. Antibiotics ordered daily IV.      Objective 160/83 75 98.3 F (36.8 C) (Oral) 18 99%  Intake/Output Summary (Last 24 hours) at 01/27/12 0846 Last data filed at 01/27/12 0505  Gross per 24 hour  Intake    480 ml  Output   1910 ml  Net  -1430 ml    Right PT palpable skin warm and dry.  Wound vac in place and working well.      Assessment/Planning: POD #6 post I&D right groin.   Continue current treatment if vac gets changed call Dr. Darrick Penna today.  Clinton Gallant [facility] 01/27/2012 8:46 AM --  Laboratory Lab Results: No results found for this basename: WBC:2,HGB:2,HCT:2,PLT:2 in the last 72 hours BMET No results found for this basename: NA:2,K:2,CL:2,CO2:2,GLUCOSE:2,BUN:2,CREATININE:2,CALCIUM:2 in the last 72 hours  COAG Lab Results  Component Value Date   INR 1.08 01/22/2012   INR 1.25 12/13/2011   INR 0.97 12/03/2011   No results found for this basename: PTT    Antibiotics Anti-infectives     Start     Dose/Rate Route Frequency Ordered Stop   01/26/12 0000   vancomycin (VANCOCIN) 1,250 mg in sodium chloride 0.9 % 250 mL IVPB        1,250 mg 166.7 mL/hr over 90 Minutes Intravenous Every 12 hours 01/25/12 2338     01/22/12 2359   vancomycin (VANCOCIN) IVPB 1000 mg/200 mL premix  Status:  Discontinued        1,000 mg 200 mL/hr over 60 Minutes Intravenous Every 12 hours 01/22/12 1515 01/25/12 2338   01/22/12 1600  piperacillin-tazobactam (ZOSYN) IVPB 3.375 g       3.375 g 12.5 mL/hr over 240 Minutes Intravenous Every 8 hours 01/22/12 1452     01/22/12 1332   polymyxin B 500,000 Units, bacitracin 50,000 Units in sodium chloride irrigation 0.9 % 500 mL irrigation  Status:  Discontinued          As needed 01/22/12 1333 01/22/12 1342   01/22/12 0857   vancomycin (VANCOCIN) 1,500 mg in sodium chloride 0.9 % 500 mL IVPB        1,500  mg 250 mL/hr over 120 Minutes Intravenous 120 min pre-op 01/22/12 0857 01/22/12 1230             Complaining of ankle pain today.  This intermittently happens at home usually relieved with tylenol VAC in place 2+ femoral pulse  Dr Myra Gianotti to reassess wound tomorrow  Fabienne Bruns, MD [facility] [place] Office: [phone] Pager: [phone]

## 2012-01-28 NOTE — Progress Notes (Addendum)
[facility] [place]  Subjective  - POD #7 I&D right groin S/P aortobifemoral bypass graft on 12/13/2011.      Objective 131/71 76 98.4 F (36.9 C) (Oral) 20 100%  Intake/Output Summary (Last 24 hours) at 01/28/12 0848 Last data filed at 01/28/12 0742  Gross per 24 hour  Intake   1110 ml  Output   2200 ml  Net  -1090 ml    Wound vac removed with evidence of mod granulation tissue. Wound is clean and beefy red appearance. No purulent drainage. Tenderness to palpation right lateral malleolus.  No injury mechanisms or edema.  Assessment/Planning: POD # I&D right groin S/P aortobifemoral bypass graft on 12/13/2011. Right ankle pain of unknown origin.  We will order PT for evaluation of ankle. Will discuss discharge plan with Dr. Myra Gianotti Pharmacy recommends clindamycin for home PO.  Clinton Gallant [facility] 01/28/2012 8:48 AM --  Laboratory Lab Results: No results found for this basename: WBC:2,HGB:2,HCT:2,PLT:2 in the last 72 hours BMET No results found for this basename: NA:2,K:2,CL:2,CO2:2,GLUCOSE:2,BUN:2,CREATININE:2,CALCIUM:2 in the last 72 hours  COAG Lab Results  Component Value Date   INR 1.08 01/22/2012   INR 1.25 12/13/2011   INR 0.97 12/03/2011   No results found for this basename: PTT    Antibiotics Anti-infectives     Start     Dose/Rate Route Frequency Ordered Stop   01/26/12 0000   vancomycin (VANCOCIN) 1,250 mg in sodium chloride 0.9 % 250 mL IVPB        1,250 mg 166.7 mL/hr over 90 Minutes Intravenous Every 12 hours 01/25/12 2338     01/22/12 2359   vancomycin (VANCOCIN) IVPB 1000 mg/200 mL premix  Status:  Discontinued        1,000 mg 200 mL/hr over 60 Minutes Intravenous Every 12 hours 01/22/12 1515 01/25/12 2338   01/22/12 1600  piperacillin-tazobactam (ZOSYN) IVPB 3.375 g       3.375 g 12.5 mL/hr over 240 Minutes Intravenous Every 8 hours 01/22/12 1452     01/22/12 1332   polymyxin B 500,000 Units, bacitracin  50,000 Units in sodium chloride irrigation 0.9 % 500 mL irrigation  Status:  Discontinued          As needed 01/22/12 1333 01/22/12 1342   01/22/12 0857   vancomycin (VANCOCIN) 1,500 mg in sodium chloride 0.9 % 500 mL IVPB        1,500 mg 250 mL/hr over 120 Minutes Intravenous 120 min pre-op 01/22/12 0857 01/22/12 1230

## 2012-01-28 NOTE — Progress Notes (Signed)
ANTIBIOTIC CONSULT NOTE - follow up Pharmacy Consult for Vancomycin and Zosyn Indication: Post-op R groin infection (s/p aorta by fem 12/5)  Allergies  Allergen Reactions  . Penicillins Nausea And Vomiting    Tolerates Zosyn. "might have been an overdose" (01/22/2012)  . Vicodin (Hydrocodone-Acetaminophen) Other (See Comments)    "triggered seizure both here and at home when he tried to take it" (01/22/2012)    Patient Measurements: Height: 5\' 8"  (172.7 cm) Weight: 144 lb 8 oz (65.545 kg) IBW/kg (Calculated) : 68.4   Vital Signs: Temp: 98.4 F (36.9 C) (01/20 0620) BP: 131/71 mmHg (01/20 0620) Pulse Rate: 76  (01/20 0620) Intake/Output from previous day: 01/19 0701 - 01/20 0700 In: 1110 [P.O.:720; I.V.:90; IV Piggyback:300] Out: 1900 [Urine:1900] Intake/Output from this shift: Total I/O In: 240 [P.O.:240] Out: 300 [Urine:300]  Labs: No results found for this basename: WBC:3,HGB:3,PLT:3,LABCREA:3,CREATININE:3 in the last 72 hours Estimated Creatinine Clearance: 88.7 ml/min (by C-G formula based on Cr of 0.68).  Basename 01/25/12 2240  VANCOTROUGH 7.2*  VANCOPEAK --  VANCORANDOM --  GENTTROUGH --  GENTPEAK --  GENTRANDOM --  TOBRATROUGH --  TOBRAPEAK --  TOBRARND --  AMIKACINPEAK --  AMIKACINTROU --  AMIKACIN --     Microbiology: Recent Results (from the past 720 hour(s))  SURGICAL PCR SCREEN     Status: Normal   Collection Time   01/22/12  9:06 AM      Component Value Range Status Comment   MRSA, PCR NEGATIVE  NEGATIVE Final    Staphylococcus aureus NEGATIVE  NEGATIVE Final   WOUND CULTURE     Status: Normal   Collection Time   01/22/12  1:47 PM      Component Value Range Status Comment   Specimen Description WOUND GROIN RIGHT   Final    Special Requests PT ON VANCO   Final    Gram Stain     Final    Value: RARE WBC PRESENT,BOTH PMN AND MONONUCLEAR     RARE SQUAMOUS EPITHELIAL CELLS PRESENT     NO ORGANISMS SEEN   Culture     Final    Value: RARE  STAPHYLOCOCCUS AUREUS     Note: RIFAMPIN AND GENTAMICIN SHOULD NOT BE USED AS SINGLE DRUGS FOR TREATMENT OF STAPH INFECTIONS.   Report Status 01/26/2012 FINAL   Final    Organism ID, Bacteria STAPHYLOCOCCUS AUREUS   Final   ANAEROBIC CULTURE     Status: Normal   Collection Time   01/22/12  1:47 PM      Component Value Range Status Comment   Specimen Description WOUND GROIN RIGHT   Final    Special Requests PT ON VANCO   Final    Gram Stain     Final    Value: RARE WBC PRESENT,BOTH PMN AND MONONUCLEAR     RARE SQUAMOUS EPITHELIAL CELLS PRESENT     NO ORGANISMS SEEN   Culture NO ANAEROBES ISOLATED   Final    Report Status 01/27/2012 FINAL   Final      Assessment: 62 y.o. male presents s/p aortobifemoral bypass graft 12/13/11 -  with R groin infection. S/p I&D 01/22/12. Wound VAC placed 01/24/12. T max 99.3. WBC on 1/14 = 5.6 Creat 1/14 = 0.68.   Vancomycin and Zosyn day #7   1/14 groin wound - rare MSSA   Goal of Therapy:  Vancomycin trough level 10-15 mcg/ml  Plan:  1. Continue Vancomycin 1gm IV q12h.  2. Continue Zosyn 3.375gm IV q8h. 3. Consider  narrowing antibiotics to po clindamycin or IV ancef. 4. Will check BMET in AM to ensure antibiotic dosing is appropriate.  Thanks! Tad Moore, BCPS  Clinical Pharmacist Pager [phone]  01/28/2012 1:44 PM

## 2012-01-28 NOTE — Consult Note (Signed)
Wound care follow-up: Vac dressing removed earlier by VVS team to assess wound.  Applied Mepitel contact dressing and one piece of black foam to cont suction.  Pt medicated for pain prior to procedure and tolerated well with minimal discomfort. Small pink drainage in cannister. Plan for bedside nurse to change dressing Q M/W/F  Cammie Mcgee, RN, MSN, [employer]  [phone]

## 2012-01-28 NOTE — Evaluation (Signed)
Physical Therapy Evaluation Patient Details Name: Lee [name] MRN: [id number] DOB: Apr 28, 1950 Today's Date: 01/28/2012 Time: 4098-1191 PT Time Calculation (min): 21 min  PT Assessment / Plan / Recommendation Clinical Impression  pt admitted with infected groin site post recent aortobifemoral BPG.  Now post I and D and VAC.  Pt's R ankle is painful at 6/10 for no known reason and pt has not amb for 1 week.  Pt can benefit from PT to improve activity tolerance and function.    PT Assessment  Patient needs continued PT services    Follow Up Recommendations  Home health PT;Supervision for mobility/OOB    Does the patient have the potential to tolerate intense rehabilitation      Barriers to Discharge        Equipment Recommendations       Recommendations for Other Services     Frequency Min 3X/week    Precautions / Restrictions Precautions Precautions: Fall Restrictions Weight Bearing Restrictions: No   Pertinent Vitals/Pain 6/10 pain in R ankle/foot      Mobility  Bed Mobility Bed Mobility: Supine to Sit;Sitting - Scoot to Edge of Bed Supine to Sit: 6: Modified independent (Device/Increase time);HOB flat Sitting - Scoot to Edge of Bed: 7: Independent Details for Bed Mobility Assistance: No cues needed. Transfers Transfers: Sit to Stand;Stand to Sit Sit to Stand: 4: Min guard;With upper extremity assist;From bed Stand to Sit: 5: Supervision;To chair/3-in-1;With upper extremity assist Details for Transfer Assistance: safe mobility Ambulation/Gait Ambulation/Gait Assistance: 4: Min guard Ambulation Distance (Feet): 150 Feet Assistive device: Rolling walker Ambulation/Gait Assistance Details: antalgic R LE due to painful ankle (unknown cause); heavy use of arms Gait Pattern: Step-through pattern;Decreased stride length;Antalgic Gait velocity: decreased Stairs: No Wheelchair Mobility Wheelchair Mobility: No    Shoulder Instructions     Exercises     PT  Diagnosis: Generalized weakness;Acute pain  PT Problem List: Decreased activity tolerance;Decreased mobility;Decreased knowledge of use of DME;Pain PT Treatment Interventions: DME instruction;Gait training;Functional mobility training;Therapeutic activities;Patient/family education   PT Goals Acute Rehab PT Goals PT Goal Formulation: With patient Time For Goal Achievement: 02/04/12 Potential to Achieve Goals: Good Pt will go Supine/Side to Sit: Independently PT Goal: Supine/Side to Sit - Progress: Goal set today Pt will go Sit to Stand: Independently PT Goal: Sit to Stand - Progress: Goal set today Pt will Ambulate: >150 feet;with modified independence;with least restrictive assistive device PT Goal: Ambulate - Progress: Goal set today  Visit Information  Last PT Received On: 01/28/12 Assistance Needed: +1    Subjective Data  Subjective: I need pain medicine Patient Stated Goal: To go home with wife   Prior Functioning  Home Living Lives With: Spouse Available Help at Discharge: Family;Available 24 hours/day Type of Home: House Home Access: Stairs to enter [employer] of Steps: 3 Entrance Stairs-Rails: Can reach both;Right;Left Home Layout: One level Bathroom Shower/Tub: Tub/shower unit;Walk-in shower Bathroom Toilet: Standard Home Adaptive Equipment: Straight cane Prior Function Level of Independence: Independent with assistive device(s) Able to Take Stairs?: Yes Driving: Yes Vocation: On disability Communication Communication: No difficulties Dominant Hand: Right    Cognition  Overall Cognitive Status: Appears within functional limits for tasks assessed/performed Arousal/Alertness: Awake/alert Orientation Level: Appears intact for tasks assessed Behavior During Session: [facility] for tasks performed    Extremity/Trunk Assessment Right Lower Extremity Assessment RLE ROM/Strength/Tone: [facility] for tasks assessed Left Lower Extremity Assessment LLE  ROM/Strength/Tone: [facility] for tasks assessed Trunk Assessment Trunk Assessment: Normal   Balance    End  of Session PT - End of Session Activity Tolerance: Patient tolerated treatment well Patient left: in chair;with call bell/phone within reach;with family/visitor present Nurse Communication: Mobility status  GP     Markiyah Gahm, Eliseo Gum 01/28/2012, 5:07 PM  01/28/2012  [place], PT [phone] (pager)

## 2012-01-29 ENCOUNTER — Telehealth: Payer: Self-pay | Admitting: Surgery

## 2012-01-29 LAB — BASIC METABOLIC PANEL
CO2: 28 mEq/L (ref 19–32)
Calcium: 9 mg/dL (ref 8.4–10.5)
Glucose, Bld: 91 mg/dL (ref 70–99)
Potassium: 3.7 mEq/L (ref 3.5–5.1)
Sodium: 130 mEq/L — ABNORMAL LOW (ref 135–145)

## 2012-01-29 MED ORDER — CLINDAMYCIN HCL 150 MG PO CAPS: 450.0000 mg | ORAL_CAPSULE | Freq: Three times a day (TID) | ORAL | Status: DC

## 2012-01-29 MED ORDER — OXYCODONE-ACETAMINOPHEN 5-325 MG PO TABS: 1.0000 | ORAL_TABLET | Freq: Four times a day (QID) | ORAL | Status: DC | PRN

## 2012-01-29 MED ORDER — LACTINEX PO CHEW: 1.0000 | CHEWABLE_TABLET | Freq: Three times a day (TID) | ORAL | Status: DC

## 2012-01-29 NOTE — Progress Notes (Signed)
Assessment unchanged. Discussed D/C instructions with pt and wife. Verbalized understanding. Iv and tele removed. Home Wound Vac set up and items in box were sent home with pt. RX given to pt. Pt left via W/C with NT

## 2012-01-29 NOTE — Telephone Encounter (Signed)
Message copied by Fredrich Birks on Tue Jan 29, 2012  3:17 PM ------      Message from: Melene Plan      Created: Tue Jan 29, 2012  1:42 PM                   ----- Message -----         From: [place], [place]         Sent: 01/29/2012  11:48 AM           To: Melene Plan, RN            2 week F/U right groin wound with vac - [place]

## 2012-01-29 NOTE — Discharge Summary (Signed)
[facility] Discharge Summary   Patient ID:  Lee [name] MRN: [id number] DOB/AGE: 63/16/1952 63 y.o.  Admit date: 01/22/2012 Discharge date: 01/29/2012 Date of Surgery: 01/22/2012 Surgeon: Surgeon(s): Nada Libman, MD  Admission Diagnosis: INFECTED WOUND  Discharge Diagnoses:  INFECTED WOUND  Secondary Diagnoses: Past Medical History  Diagnosis Date  . Hypertension   . Peripheral vascular disease   . Aneurysm of femoral artery   . COPD (chronic obstructive pulmonary disease)   . Kidney stone   . Grand mal epilepsy, controlled 1980's  . Stroke Sept. 2012    denies residual (01/22/2012)  . Arthritis     "anwhere I've been hurt before" (01/22/2012)    Procedure(s): IRRIGATION AND DEBRIDEMENT EXTREMITY  Discharged Condition: good  HPI: Lee [name] is a 62 y.o. male who is status post aortobifemoral bypass graft on 12/13/2011. This was done in the setting of rest pain. He was seen in the office on 1227 secondary from drainage in his right groin the patient was started on antibiotics. He is here today for wound check. He has had no fevers. Drainage has decreased. He was seen in the office and Dr. Myra Gianotti probed the right groin incision. This did track approximately 1-2 cm with turbid appearing fluid without odor evacuated. Dr. Myra Gianotti was concerned that the tract of the wound may get down to the graft and he cannot get a good evaluation in the office secondary to the patient's discomfort. For that reason he needs to have this area explored and the operating room. The plan will be to open a portion of the wound and irrigating and washing it out and then reclosing it. He'll most likely have to have this area packed. The patient understands. This be done 01/22/12.  Hospital Course:  Lee [name] is a 62 y.o. male is S/P Right groin Procedure(s): IRRIGATION AND DEBRIDEMENT EXTREMITY Findings: No gross purulence was identified. The tissue all  appeared to be healthy. The wound it did extend down to the anterior side of the dacryon graft. The graft appeared to be well incorporated with the exception of the anterior surface.  Extubated: POD # 0 Post-op wounds healing well with VAC in Place Pt. Ambulating, voiding and taking PO diet without difficulty. Pt pain controlled with PO pain meds. Labs as below Complications: cultures grew out MSSA from groin wound. Pharm recommend pt go home on 450mg  Clindamycin TID for 10 days. He has been on Vancomycin and Zosyn  Consults:Pharmacy Wound care RN for [facility]    Significant Diagnostic Studies: CBC Lab Results  Component Value Date   WBC 5.6 01/22/2012   HGB 12.6* 01/22/2012   HCT 35.7* 01/22/2012   MCV 95.2 01/22/2012   PLT 249 01/22/2012    BMET    Component Value Date/Time   NA 130* 01/29/2012 0530   K 3.7 01/29/2012 0530   CL 91* 01/29/2012 0530   CO2 28 01/29/2012 0530   GLUCOSE 91 01/29/2012 0530   BUN 6 01/29/2012 0530   CREATININE 0.73 01/29/2012 0530   CALCIUM 9.0 01/29/2012 0530   GFRNONAA >90 01/29/2012 0530   GFRAA >90 01/29/2012 0530   COAG Lab Results  Component Value Date   INR 1.08 01/22/2012   INR 1.25 12/13/2011   INR 0.97 12/03/2011     Disposition:  Discharge to :Home with home RN for wound vac changes Discharge Orders    Future Orders Please Complete By Expires   Resume previous diet  Driving Restrictions      Comments:   No driving for 4 weeks   Lifting restrictions      Comments:   No lifting for 4 weeks   Call MD for:  temperature >100.5      Call MD for:  redness, tenderness, or signs of infection (pain, swelling, bleeding, redness, odor or green/yellow discharge around incision site)      Call MD for:  severe or increased pain, loss or decreased feeling  in affected limb(s)      Discharge wound care:      Comments:   RN to change wound vac Q M,W,F   Increase activity slowly      Comments:   Walk with assistance use walker or cane as needed -  ambulate as tolerated      [name], [name]  Home Medication Instructions [id number]   Printed on:01/29/12 1150  Medication Information                    aspirin 81 MG tablet Take 81 mg by mouth daily.           vitamin E 400 UNIT capsule Take 400 Units by mouth daily.           Iron-Vitamins (GERITOL COMPLETE PO) Take 1 tablet by mouth daily.           lisinopril (PRINIVIL,ZESTRIL) 20 MG tablet Take 1 tablet by mouth daily before breakfast.            carbamazepine (EPITOL) 200 MG tablet Take 400 mg by mouth 3 (three) times daily.           gabapentin (NEURONTIN) 300 MG capsule Take 600 mg by mouth 3 (three) times daily.           levETIRAcetam (KEPPRA) 750 MG tablet Take 750 mg by mouth every 12 (twelve) hours.           oxyCODONE-acetaminophen (PERCOCET/ROXICET) 5-325 MG per tablet Take 1-2 tablets by mouth every 6 (six) hours as needed.           clindamycin (CLEOCIN) 150 MG capsule Take 3 capsules (450 mg total) by mouth 3 (three) times daily. For 10 days           lactobacillus acidophilus & bulgar (LACTINEX) chewable tablet Chew 1 tablet by mouth 3 (three) times daily with meals.            Verbal and written Discharge instructions given to the patient. Wound care per Discharge AVS  F/U 2 weeks with Dr. Myra Gianotti  Signed: Marlowe Shores 01/29/2012, 11:50 AM

## 2012-01-29 NOTE — Telephone Encounter (Signed)
Lm for pt regarding follow up, dpm

## 2012-02-01 ENCOUNTER — Telehealth: Payer: Self-pay | Admitting: Surgery

## 2012-02-01 NOTE — Telephone Encounter (Signed)
Spoke with pt to schedule for 11:30 on 01/27, dpm

## 2012-02-01 NOTE — Telephone Encounter (Signed)
Message copied by Fredrich Birks on Fri Feb 01, 2012  1:05 PM ------      Message from: Melene Plan      Created: Fri Feb 01, 2012 11:32 AM       DR VWB WANTS TO SEE THIS PT MON

## 2012-02-04 ENCOUNTER — Encounter: Payer: Self-pay | Admitting: Surgery

## 2012-02-04 ENCOUNTER — Ambulatory Visit (INDEPENDENT_AMBULATORY_CARE_PROVIDER_SITE_OTHER): Payer: Medicare Other | Admitting: Surgery

## 2012-02-04 VITALS — BP 125/77 | HR 74 | Temp 97.9°F | Resp 16 | Ht 68.0 in | Wt 147.0 lb

## 2012-02-04 DIAGNOSIS — M7989 Other specified soft tissue disorders: Secondary | ICD-10-CM

## 2012-02-04 DIAGNOSIS — I739 Peripheral vascular disease, unspecified: Secondary | ICD-10-CM

## 2012-02-04 NOTE — Progress Notes (Signed)
The patient is back today for followup. He is status post aortobifemoral bypass graft. Unfortunately he developed a wound in his right groin which required exploration in the operating room on 01/22/2012. This did track down to the graft which was exposed. There was not any evidence of infection at the time of operation. The tissue all looked healthy. Intraoperative cultures showed rare staph aureus which grew out MRSA. He was sent home on clindamycin. He has had some diarrhea but this has resolved. His wound was then a wound VAC.  I removed his went back today. There is beefy red granulation tissue. The wound is much more shallow. There is no graft exposed.  I replaced the wound VAC today. He will come back in one month. I gave him additional clindamycin to give a total of 1 month's worth of antibiotics. I also gave him 40 Percocet. Hopefully this will heal without compromising his graft. Again a followup in one month.

## 2012-02-11 ENCOUNTER — Ambulatory Visit: Payer: Medicare Other | Admitting: Surgery

## 2012-02-11 DIAGNOSIS — Z0279 Encounter for issue of other medical certificate: Secondary | ICD-10-CM

## 2012-02-13 ENCOUNTER — Telehealth: Payer: Self-pay

## 2012-02-13 NOTE — Telephone Encounter (Addendum)
Message copied by Shari Prows on Wed Feb 13, 2012  5:36 PM ------      Message from: Phillips Odor      Created: Wed Feb 13, 2012  5:13 PM      Regarding: update       Dr. Rueben Bash advise on wound care/ right groin wound superficial/ wound vac d/c'd per home care.            Front desk- please move pt's appt. To earlier date than 2/24, if V[facility] has opening.              Per my triage note 2/5:            Adv. HH wound specialist nurse called to report that wound has become superficial; reports measurements of 2.8cm length x 0.2cm width x 0.3 cm depth.  States unable to pack with the foam packing.  States Adv. HH parameters for d/c'ing the wound vac are <0.5 cm depth.  The wound care nurse stated she dressed wound with Vigilon gel ("like Hydrogel") and covered w/ a foam dsg.  States there is no drainage at this time.  Will make Dr. Myra Gianotti aware, and will move f/u appt. to an earlier date than 2/24.  Pt. will be notified  I rescheduled the above pt's appt to 02/25/12 at 2:45pm w/ V[facility]. The patient is aware/awt

## 2012-02-13 NOTE — Telephone Encounter (Signed)
Adv. HH wound specialist nurse called to report that wound has become superficial; reports measurements of 2.8cm length x 0.2cm width x 0.3 cm depth.  States unable to pack with the foam packing.  States Adv. HH parameters for d/c'ing the wound vac are <0.5 cm depth.  The wound care nurse stated she dressed wound with Vigilon gel ("like Hydrogel") and covered w/ a foam dsg.  States there is no drainage at this time.  Will make Dr. Myra Gianotti aware, and will move f/u appt. to an earlier date than 2/24.  Pt. will be notified.

## 2012-02-22 ENCOUNTER — Encounter: Payer: Self-pay | Admitting: Surgery

## 2012-02-25 ENCOUNTER — Ambulatory Visit (INDEPENDENT_AMBULATORY_CARE_PROVIDER_SITE_OTHER): Payer: Medicare Other | Admitting: Surgery

## 2012-02-25 ENCOUNTER — Encounter: Payer: Self-pay | Admitting: Surgery

## 2012-02-25 VITALS — BP 126/81 | HR 77 | Ht 68.0 in | Wt 148.8 lb

## 2012-02-25 DIAGNOSIS — I739 Peripheral vascular disease, unspecified: Secondary | ICD-10-CM

## 2012-02-25 DIAGNOSIS — Z48812 Encounter for surgical aftercare following surgery on the circulatory system: Secondary | ICD-10-CM

## 2012-02-25 NOTE — Progress Notes (Signed)
The patient comes back for a wound check. He is status post aortobifemoral bypass graft. He developed a wound complication in the right groin. This required surgical exploration. The graft was found to be exposed. He underwent meticulous wound care. This included a wound VAC and then ultimately dressing changes. His wound has completely healed. He is now participating in physical therapy and getting more strength in his right leg. He is walking further and having less pain. I will see him back in 6 months. I gave him 40 Percocet today to help with some of the pain he has at night. I will get a duplex of each distal anastomosis to make sure that there had been no complications given  his wound issues

## 2012-02-26 NOTE — Addendum Note (Signed)
Addended by: Sharee Pimple on: 02/26/2012 08:02 AM   Modules accepted: Orders

## 2012-03-03 ENCOUNTER — Ambulatory Visit: Payer: Medicare Other | Admitting: Surgery

## 2012-03-20 ENCOUNTER — Telehealth: Payer: Self-pay | Admitting: *Deleted

## 2012-03-20 NOTE — Telephone Encounter (Signed)
Patient called requesting oxycodone. Explained Dr Myra Gianotti would give him Hydrocodone 5/325  # 20 no refill. Called to [facility]. Requesting meds due to pain at night.

## 2012-05-22 ENCOUNTER — Other Ambulatory Visit: Payer: Self-pay | Admitting: Diagnostic Neuroimaging

## 2012-07-07 ENCOUNTER — Ambulatory Visit (INDEPENDENT_AMBULATORY_CARE_PROVIDER_SITE_OTHER): Payer: Medicare Other | Admitting: Diagnostic Neuroimaging

## 2012-07-07 ENCOUNTER — Encounter: Payer: Self-pay | Admitting: Diagnostic Neuroimaging

## 2012-07-07 VITALS — BP 164/76 | HR 64 | Ht 68.0 in | Wt 171.0 lb

## 2012-07-07 DIAGNOSIS — G40909 Epilepsy, unspecified, not intractable, without status epilepticus: Secondary | ICD-10-CM

## 2012-07-07 NOTE — Patient Instructions (Addendum)
Follow up in 6-12 months.  Continue current medications.
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
## 2012-07-27 ENCOUNTER — Other Ambulatory Visit: Payer: Self-pay | Admitting: Diagnostic Neuroimaging

## 2012-07-28 ENCOUNTER — Other Ambulatory Visit: Payer: Self-pay | Admitting: Diagnostic Neuroimaging

## 2012-08-22 ENCOUNTER — Encounter: Payer: Self-pay | Admitting: Surgery

## 2012-08-25 ENCOUNTER — Encounter (INDEPENDENT_AMBULATORY_CARE_PROVIDER_SITE_OTHER): Payer: Medicare Other | Admitting: *Deleted

## 2012-08-25 ENCOUNTER — Encounter: Payer: Self-pay | Admitting: Surgery

## 2012-08-25 ENCOUNTER — Ambulatory Visit (INDEPENDENT_AMBULATORY_CARE_PROVIDER_SITE_OTHER): Payer: Medicare Other | Admitting: Surgery

## 2012-08-25 ENCOUNTER — Encounter (INDEPENDENT_AMBULATORY_CARE_PROVIDER_SITE_OTHER): Payer: Medicare Other

## 2012-08-25 ENCOUNTER — Other Ambulatory Visit: Payer: Self-pay | Admitting: *Deleted

## 2012-08-25 VITALS — BP 173/92 | HR 69 | Ht 68.0 in | Wt 169.0 lb

## 2012-08-25 DIAGNOSIS — I739 Peripheral vascular disease, unspecified: Secondary | ICD-10-CM

## 2012-08-25 DIAGNOSIS — I6529 Occlusion and stenosis of unspecified carotid artery: Secondary | ICD-10-CM

## 2012-08-25 DIAGNOSIS — Z48812 Encounter for surgical aftercare following surgery on the circulatory system: Secondary | ICD-10-CM | POA: Insufficient documentation
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
## 2012-08-26 NOTE — Addendum Note (Signed)
Addended by: Sharee Pimple on: 08/26/2012 09:19 AM   Modules accepted: Orders

## 2012-11-21 ENCOUNTER — Other Ambulatory Visit: Payer: Self-pay | Admitting: Diagnostic Neuroimaging

## 2012-11-21 MED ORDER — LEVETIRACETAM 750 MG PO TABS: 750.0000 mg | ORAL_TABLET | Freq: Two times a day (BID) | ORAL | Status: DC

## 2012-12-12 ENCOUNTER — Other Ambulatory Visit: Payer: Self-pay | Admitting: Diagnostic Neuroimaging

## 2013-01-14 ENCOUNTER — Telehealth: Payer: Self-pay | Admitting: Diagnostic Neuroimaging

## 2013-01-14 MED ORDER — GABAPENTIN 600 MG PO TABS: 600.0000 mg | ORAL_TABLET | Freq: Three times a day (TID) | ORAL | Status: DC

## 2013-01-14 NOTE — Telephone Encounter (Signed)
Rx was already sent for #[id number].  I have resent the Rx.

## 2013-01-14 NOTE — Telephone Encounter (Signed)
Patient called stating that he needs his script of Gabapentin rewritten for 270 pills instead of 90 since he says he takes them 3 times per day.

## 2013-02-23 ENCOUNTER — Encounter: Payer: Self-pay | Admitting: Family

## 2013-02-24 ENCOUNTER — Encounter: Payer: Self-pay | Admitting: Family

## 2013-02-25 ENCOUNTER — Ambulatory Visit (INDEPENDENT_AMBULATORY_CARE_PROVIDER_SITE_OTHER)
Admission: RE | Admit: 2013-02-25 | Discharge: 2013-02-25 | Disposition: A | Discharge status: Home / Self Care | Payer: Medicare Other | Source: Ambulatory Visit | Attending: Surgery | Admitting: Surgery

## 2013-02-25 ENCOUNTER — Ambulatory Visit (HOSPITAL_COMMUNITY)
Admission: RE | Admit: 2013-02-25 | Discharge: 2013-02-25 | Disposition: A | Discharge status: Home / Self Care | Payer: Medicare Other | Source: Ambulatory Visit | Attending: Family | Admitting: Family

## 2013-02-25 ENCOUNTER — Ambulatory Visit (INDEPENDENT_AMBULATORY_CARE_PROVIDER_SITE_OTHER): Payer: Medicare Other | Admitting: Family

## 2013-02-25 ENCOUNTER — Encounter: Payer: Self-pay | Admitting: Family

## 2013-02-25 VITALS — BP 155/83 | HR 66 | Resp 16 | Ht 70.0 in | Wt 179.0 lb

## 2013-02-25 DIAGNOSIS — Z48812 Encounter for surgical aftercare following surgery on the circulatory system: Secondary | ICD-10-CM

## 2013-02-25 DIAGNOSIS — I6529 Occlusion and stenosis of unspecified carotid artery: Secondary | ICD-10-CM

## 2013-02-25 DIAGNOSIS — I658 Occlusion and stenosis of other precerebral arteries: Secondary | ICD-10-CM | POA: Insufficient documentation

## 2013-02-25 DIAGNOSIS — I739 Peripheral vascular disease, unspecified: Secondary | ICD-10-CM

## 2013-02-25 NOTE — Patient Instructions (Signed)
Stroke Prevention Some medical conditions and behaviors are associated with an increased chance of having a stroke. You may prevent a stroke by making healthy choices and managing medical conditions. HOW CAN I REDUCE MY RISK OF HAVING A STROKE?   Stay physically active. Get at least 30 minutes of activity on most or all days.  Do not smoke. It may also be helpful to avoid exposure to secondhand smoke.  Limit alcohol use. Moderate alcohol use is considered to be:  No more than 2 drinks per day for men.  No more than 1 drink per day for nonpregnant women.  Eat healthy foods. This involves  Eating 5 or more servings of fruits and vegetables a day.  Following a diet that addresses high blood pressure (hypertension), high cholesterol, diabetes, or obesity.  Manage your cholesterol levels.  A diet low in saturated fat, trans fat, and cholesterol and high in fiber may control cholesterol levels.  Take any prescribed medicines to control cholesterol as directed by your health care provider.  Manage your diabetes.  A controlled-carbohydrate, controlled-sugar diet is recommended to manage diabetes.  Take any prescribed medicines to control diabetes as directed by your health care provider.  Control your hypertension.  A low-salt (sodium), low-saturated fat, low-trans fat, and low-cholesterol diet is recommended to manage hypertension.  Take any prescribed medicines to control hypertension as directed by your health care provider.  Maintain a healthy weight.  A reduced-calorie, low-sodium, low-saturated fat, low-trans fat, low-cholesterol diet is recommended to manage weight.  Stop drug abuse.  Avoid taking birth control pills.  Talk to your health care provider about the risks of taking birth control pills if you are over 35 years old, smoke, get migraines, or have ever had a blood clot.  Get evaluated for sleep disorders (sleep apnea).  Talk to your health care provider about  getting a sleep evaluation if you snore a lot or have excessive sleepiness.  Take medicines as directed by your health care provider.  For some people, aspirin or blood thinners (anticoagulants) are helpful in reducing the risk of forming abnormal blood clots that can lead to stroke. If you have the irregular heart rhythm of atrial fibrillation, you should be on a blood thinner unless there is a good reason you cannot take them.  Understand all your medicine instructions.  Make sure that other other conditions (such as anemia or atherosclerosis) are addressed. SEEK IMMEDIATE MEDICAL CARE IF:   You have sudden weakness or numbness of the face, arm, or leg, especially on one side of the body.  Your face or eyelid droops to one side.  You have sudden confusion.  You have trouble speaking (aphasia) or understanding.  You have sudden trouble seeing in one or both eyes.  You have sudden trouble walking.  You have dizziness.  You have a loss of balance or coordination.  You have a sudden, severe headache with no known cause.  You have new chest pain or an irregular heartbeat. Any of these symptoms may represent a serious problem that is an emergency. Do not wait to see if the symptoms will go away. Get medical help at once. Call your local emergency services  (911 in [place] Do not drive yourself to the hospital. Document Released: 02/02/2004 Document Revised: 10/15/2012 Document Reviewed: 06/27/2012 [employer] Patient Information 2014 [employer]   Peripheral Vascular Disease Peripheral Vascular Disease (PVD), also called Peripheral Arterial Disease (PAD), is a circulation problem caused by cholesterol (atherosclerotic plaque) deposits in the   arteries. PVD commonly occurs in the lower extremities (legs) but it can occur in other areas of the body, such as your arms. The cholesterol buildup in the arteries reduces blood flow which can cause pain and other serious problems. The presence  of PVD can place a person at risk for Coronary Artery Disease (CAD).  CAUSES  Causes of PVD can be many. It is usually associated with more than one risk factor such as:   High Cholesterol.  Smoking.  Diabetes.  Lack of exercise or inactivity.  High blood pressure (hypertension).  Obesity.  Family history. SYMPTOMS   When the lower extremities are affected, patients with PVD may experience:  Leg pain with exertion or physical activity. This is called INTERMITTENT CLAUDICATION. This may present as cramping or numbness with physical activity. The location of the pain is associated with the level of blockage. For example, blockage at the abdominal level (distal abdominal aorta) may result in buttock or hip pain. Lower leg arterial blockage may result in calf pain.  As PVD becomes more severe, pain can develop with less physical activity.  In people with severe PVD, leg pain may occur at rest.  Other PVD signs and symptoms:  Leg numbness or weakness.  Coldness in the affected leg or foot, especially when compared to the other leg.  A change in leg color.  Patients with significant PVD are more prone to ulcers or sores on toes, feet or legs. These may take longer to heal or may reoccur. The ulcers or sores can become infected.  If signs and symptoms of PVD are ignored, gangrene may occur. This can result in the loss of toes or loss of an entire limb.  Not all leg pain is related to PVD. Other medical conditions can cause leg pain such as:  Blood clots (embolism) or Deep Vein Thrombosis.  Inflammation of the blood vessels (vasculitis).  Spinal stenosis. DIAGNOSIS  Diagnosis of PVD can involve several different types of tests. These can include:  Pulse Volume Recording Method (PVR). This test is simple, painless and does not involve the use of X-rays. PVR involves measuring and comparing the blood pressure in the arms and legs. An ABI (Ankle-Brachial Index) is calculated.  The normal ratio of blood pressures is 1. As this number becomes smaller, it indicates more severe disease.  < 0.95  indicates significant narrowing in one or more leg vessels.  <0.8 there will usually be pain in the foot, leg or buttock with exercise.  <0.4 will usually have pain in the legs at rest.  <0.25  usually indicates limb threatening PVD.  Doppler detection of pulses in the legs. This test is painless and checks to see if you have a pulses in your legs/feet.  A dye or contrast material (a substance that highlights the blood vessels so they show up on x-ray) may be given to help your caregiver better see the arteries for the following tests. The dye is eliminated from your body by the kidney's. Your caregiver may order blood work to check your kidney function and other laboratory values before the following tests are performed:  Magnetic Resonance Angiography (MRA). An MRA is a picture study of the blood vessels and arteries. The MRA machine uses a large magnet to produce images of the blood vessels.  Computed Tomography Angiography (CTA). A CTA is a specialized x-ray that looks at how the blood flows in your blood vessels. An IV may be inserted into your arm so contrast dye   can be injected.  Angiogram. Is a procedure that uses x-rays to look at your blood vessels. This procedure is minimally invasive, meaning a small incision (cut) is made in your groin. A small tube (catheter) is then inserted into the artery of your groin. The catheter is guided to the blood vessel or artery your caregiver wants to examine. Contrast dye is injected into the catheter. X-rays are then taken of the blood vessel or artery. After the images are obtained, the catheter is taken out. TREATMENT  Treatment of PVD involves many interventions which may include:  Lifestyle changes:  Quitting smoking.  Exercise.  Following a low fat, low cholesterol diet.  Control of diabetes.  Foot care is very  important to the PVD patient. Good foot care can help prevent infection.  Medication:  Cholesterol-lowering medicine.  Blood pressure medicine.  Anti-platelet drugs.  Certain medicines may reduce symptoms of Intermittent Claudication.  Interventional/Surgical options:  Angioplasty. An Angioplasty is a procedure that inflates a balloon in the blocked artery. This opens the blocked artery to improve blood flow.  Stent Implant. A wire mesh tube (stent) is placed in the artery. The stent expands and stays in place, allowing the artery to remain open.  Peripheral Bypass Surgery. This is a surgical procedure that reroutes the blood around a blocked artery to help improve blood flow. This type of procedure may be performed if Angioplasty or stent implants are not an option. SEEK IMMEDIATE MEDICAL CARE IF:   You develop pain or numbness in your arms or legs.  Your arm or leg turns cold, becomes blue in color.  You develop redness, warmth, swelling and pain in your arms or legs. MAKE SURE YOU:   Understand these instructions.  Will watch your condition.  Will get help right away if you are not doing well or get worse. Document Released: 02/02/2004 Document Revised: 03/19/2011 Document Reviewed: 12/30/2007 [employer] Patient Information 2014 [employer].   Smoking Cessation Quitting smoking is important to your health and has many advantages. However, it is not always easy to quit since nicotine is a very addictive drug. Often times, people try 3 times or more before being able to quit. This document explains the best ways for you to prepare to quit smoking. Quitting takes hard work and a lot of effort, but you can do it. ADVANTAGES OF QUITTING SMOKING  You will live longer, feel better, and live better.  Your body will feel the impact of quitting smoking almost immediately.  Within 20 minutes, blood pressure decreases. Your pulse returns to its normal level.  After 8 hours,  carbon monoxide levels in the blood return to normal. Your oxygen level increases.  After 24 hours, the chance of having a heart attack starts to decrease. Your breath, hair, and body stop smelling like smoke.  After 48 hours, damaged nerve endings begin to recover. Your sense of taste and smell improve.  After 72 hours, the body is virtually free of nicotine. Your bronchial tubes relax and breathing becomes easier.  After 2 to 12 weeks, lungs can hold more air. Exercise becomes easier and circulation improves.  The risk of having a heart attack, stroke, cancer, or lung disease is greatly reduced.  After 1 year, the risk of coronary heart disease is cut in half.  After 5 years, the risk of stroke falls to the same as a nonsmoker.  After 10 years, the risk of lung cancer is cut in half and the risk of other cancers   decreases significantly.  After 15 years, the risk of coronary heart disease drops, usually to the level of a nonsmoker.  If you are pregnant, quitting smoking will improve your chances of having a healthy baby.  The people you live with, especially any children, will be healthier.  You will have extra money to spend on things other than cigarettes. QUESTIONS TO THINK ABOUT BEFORE ATTEMPTING TO QUIT You may want to talk about your answers with your caregiver.  Why do you want to quit?  If you tried to quit in the past, what helped and what did not?  What will be the most difficult situations for you after you quit? How will you plan to handle them?  Who can help you through the tough times? Your family? Friends? A caregiver?  What pleasures do you get from smoking? What ways can you still get pleasure if you quit? Here are some questions to ask your caregiver:  How can you help me to be successful at quitting?  What medicine do you think would be best for me and how should I take it?  What should I do if I need more help?  What is smoking withdrawal like? How  can I get information on withdrawal? GET READY  Set a quit date.  Change your environment by getting rid of all cigarettes, ashtrays, matches, and lighters in your home, car, or work. Do not let people smoke in your home.  Review your past attempts to quit. Think about what worked and what did not. GET SUPPORT AND ENCOURAGEMENT You have a better chance of being successful if you have help. You can get support in many ways.  Tell your family, friends, and co-workers that you are going to quit and need their support. Ask them not to smoke around you.  Get individual, group, or telephone counseling and support. Programs are available at local hospitals and health centers. Call your local health department for information about programs in your area.  Spiritual beliefs and practices may help some smokers quit.  Download a "quit meter" on your computer to keep track of quit statistics, such as how long you have gone without smoking, cigarettes not smoked, and money saved.  Get a self-help book about quitting smoking and staying off of tobacco. LEARN NEW SKILLS AND BEHAVIORS  Distract yourself from urges to smoke. Talk to someone, go for a walk, or occupy your time with a task.  Change your normal routine. Take a different route to work. Drink tea instead of coffee. Eat breakfast in a different place.  Reduce your stress. Take a hot bath, exercise, or read a book.  Plan something enjoyable to do every day. Reward yourself for not smoking.  Explore interactive web-based programs that specialize in helping you quit. GET MEDICINE AND USE IT CORRECTLY Medicines can help you stop smoking and decrease the urge to smoke. Combining medicine with the above behavioral methods and support can greatly increase your chances of successfully quitting smoking.  Nicotine replacement therapy helps deliver nicotine to your body without the negative effects and risks of smoking. Nicotine replacement therapy  includes nicotine gum, lozenges, inhalers, nasal sprays, and skin patches. Some may be available over-the-counter and others require a prescription.  Antidepressant medicine helps people abstain from smoking, but how this works is unknown. This medicine is available by prescription.  Nicotinic receptor partial agonist medicine simulates the effect of nicotine in your brain. This medicine is available by prescription. Ask your caregiver for   advice about which medicines to use and how to use them based on your health history. Your caregiver will tell you what side effects to look out for if you choose to be on a medicine or therapy. Carefully read the information on the package. Do not use any other product containing nicotine while using a nicotine replacement product.  RELAPSE OR DIFFICULT SITUATIONS Most relapses occur within the first 3 months after quitting. Do not be discouraged if you start smoking again. Remember, most people try several times before finally quitting. You may have symptoms of withdrawal because your body is used to nicotine. You may crave cigarettes, be irritable, feel very hungry, cough often, get headaches, or have difficulty concentrating. The withdrawal symptoms are only temporary. They are strongest when you first quit, but they will go away within 10 14 days. To reduce the chances of relapse, try to:  Avoid drinking alcohol. Drinking lowers your chances of successfully quitting.  Reduce the amount of caffeine you consume. Once you quit smoking, the amount of caffeine in your body increases and can give you symptoms, such as a rapid heartbeat, sweating, and anxiety.  Avoid smokers because they can make you want to smoke.  Do not let weight gain distract you. Many smokers will gain weight when they quit, usually less than 10 pounds. Eat a healthy diet and stay active. You can always lose the weight gained after you quit.  Find ways to improve your mood other than  smoking. FOR MORE INFORMATION  www.smokefree.gov  Document Released: 12/19/2000 Document Revised: 06/26/2011 Document Reviewed: 04/05/2011 [employer] Patient Information 2014 [employer].  
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
## 2013-03-02 ENCOUNTER — Other Ambulatory Visit: Payer: Self-pay | Admitting: *Deleted

## 2013-03-02 DIAGNOSIS — Z48812 Encounter for surgical aftercare following surgery on the circulatory system: Secondary | ICD-10-CM

## 2013-03-02 DIAGNOSIS — I739 Peripheral vascular disease, unspecified: Secondary | ICD-10-CM

## 2013-03-02 DIAGNOSIS — I6529 Occlusion and stenosis of unspecified carotid artery: Secondary | ICD-10-CM

## 2013-03-02 NOTE — Addendum Note (Signed)
Addended by: Adria DillELDRIDGE-LEWIS, Jazzmyn Filion L on: 03/02/2013 11:08 AM   Modules accepted: Orders

## 2013-04-08 ENCOUNTER — Other Ambulatory Visit: Payer: Self-pay

## 2013-04-08 MED ORDER — CARBAMAZEPINE 200 MG PO TABS: ORAL_TABLET | ORAL | Status: DC

## 2013-05-18 ENCOUNTER — Telehealth: Payer: Self-pay | Admitting: Diagnostic Neuroimaging

## 2013-05-18 NOTE — Telephone Encounter (Signed)
Spoke with patient and he would like to speak with Dr [name] personally concerning his seizures and to schedule an appt.

## 2013-05-22 ENCOUNTER — Encounter: Payer: Self-pay | Admitting: Surgery

## 2013-05-22 NOTE — Telephone Encounter (Signed)
I spoke with patient. No seizures. No new events. Will see him in June 2015. -VRP

## 2013-05-25 ENCOUNTER — Ambulatory Visit (INDEPENDENT_AMBULATORY_CARE_PROVIDER_SITE_OTHER)
Admission: RE | Admit: 2013-05-25 | Discharge: 2013-05-25 | Disposition: A | Discharge status: Home / Self Care | Payer: Medicare Other | Source: Ambulatory Visit | Attending: Surgery | Admitting: Surgery

## 2013-05-25 ENCOUNTER — Ambulatory Visit (HOSPITAL_COMMUNITY)
Admission: RE | Admit: 2013-05-25 | Discharge: 2013-05-25 | Disposition: A | Discharge status: Home / Self Care | Payer: Medicare Other | Source: Ambulatory Visit | Attending: Surgery | Admitting: Surgery

## 2013-05-25 ENCOUNTER — Ambulatory Visit (INDEPENDENT_AMBULATORY_CARE_PROVIDER_SITE_OTHER): Payer: Medicare Other | Admitting: Surgery

## 2013-05-25 ENCOUNTER — Encounter: Payer: Self-pay | Admitting: Surgery

## 2013-05-25 VITALS — BP 144/82 | HR 60 | Resp 18 | Ht 68.0 in | Wt 181.0 lb

## 2013-05-25 DIAGNOSIS — Z48812 Encounter for surgical aftercare following surgery on the circulatory system: Secondary | ICD-10-CM

## 2013-05-25 DIAGNOSIS — I70209 Unspecified atherosclerosis of native arteries of extremities, unspecified extremity: Secondary | ICD-10-CM | POA: Insufficient documentation

## 2013-05-25 DIAGNOSIS — I739 Peripheral vascular disease, unspecified: Secondary | ICD-10-CM
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
## 2013-05-26 NOTE — Addendum Note (Signed)
Addended by: Rhylan Kagel K on: 05/26/2013 10:39 AM   Modules accepted: Orders  

## 2013-06-15 ENCOUNTER — Other Ambulatory Visit: Payer: Self-pay | Admitting: Diagnostic Neuroimaging

## 2013-06-15 MED ORDER — CARBAMAZEPINE 200 MG PO TABS: ORAL_TABLET | ORAL | Status: DC

## 2013-06-22 ENCOUNTER — Encounter: Payer: Self-pay | Admitting: Diagnostic Neuroimaging

## 2013-06-22 ENCOUNTER — Ambulatory Visit (INDEPENDENT_AMBULATORY_CARE_PROVIDER_SITE_OTHER): Payer: Medicare Other | Admitting: Diagnostic Neuroimaging

## 2013-06-22 VITALS — BP 129/77 | HR 74 | Temp 98.6°F | Ht 68.0 in | Wt 182.0 lb

## 2013-06-22 DIAGNOSIS — G40909 Epilepsy, unspecified, not intractable, without status epilepticus: Secondary | ICD-10-CM

## 2013-06-22 DIAGNOSIS — G40109 Localization-related (focal) (partial) symptomatic epilepsy and epileptic syndromes with simple partial seizures, not intractable, without status epilepticus: Secondary | ICD-10-CM

## 2013-06-22 MED ORDER — LEVETIRACETAM 750 MG PO TABS: 750.0000 mg | ORAL_TABLET | Freq: Two times a day (BID) | ORAL | Status: DC

## 2013-06-22 MED ORDER — GABAPENTIN 600 MG PO TABS: 600.0000 mg | ORAL_TABLET | Freq: Three times a day (TID) | ORAL | Status: DC

## 2013-06-22 MED ORDER — CARBAMAZEPINE 200 MG PO TABS: ORAL_TABLET | ORAL | Status: DC

## 2013-06-22 NOTE — Patient Instructions (Signed)
Continue current medications.   No driving until seizure free x 6 months. Last sz ~ 01/22/13.
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
## 2013-09-10 ENCOUNTER — Telehealth: Payer: Self-pay | Admitting: Neurology

## 2013-09-10 ENCOUNTER — Emergency Department (HOSPITAL_COMMUNITY)
Admission: EM | Admit: 2013-09-10 | Discharge: 2013-09-10 | Discharge status: Against Medical Advice | Payer: Medicare Other | Attending: Emergency Medicine | Admitting: Emergency Medicine

## 2013-09-10 ENCOUNTER — Encounter (HOSPITAL_COMMUNITY): Payer: Self-pay | Admitting: Emergency Medicine

## 2013-09-10 DIAGNOSIS — I10 Essential (primary) hypertension: Secondary | ICD-10-CM | POA: Insufficient documentation

## 2013-09-10 DIAGNOSIS — J449 Chronic obstructive pulmonary disease, unspecified: Secondary | ICD-10-CM | POA: Diagnosis not present

## 2013-09-10 DIAGNOSIS — R569 Unspecified convulsions: Secondary | ICD-10-CM | POA: Diagnosis present

## 2013-09-10 DIAGNOSIS — J4489 Other specified chronic obstructive pulmonary disease: Secondary | ICD-10-CM | POA: Insufficient documentation

## 2013-09-10 NOTE — Telephone Encounter (Signed)
Received call from Ms Lavine who states that earlier this week the patient missed a few doses of his seizure medication and had 2 seizures on Monday. Since those seizures he has not returned to his baseline status, she reports he remains lethargic and disoriented. She called to request an earlier follow up appointment. Counseled her that based on the history he needs to go the the ED tonight to be formally evaluated as per her history he has been altered for >3 days after a breakthrough seizure. She expressed understanding and will proceed to the ED.

## 2013-09-10 NOTE — ED Notes (Addendum)
Pt in with family stating that he had a seizure on Monday, but since that time family states patient is not acting like himself, pt with decreased PO intake, not as active as normal, pt alert and oriented, denies complaints, no distress noted. Pt has not had another seizure since Monday. Pt is oriented to person and place, but confused on date- states that it is Aug 17, 1950 when asked.

## 2013-09-11 ENCOUNTER — Telehealth: Payer: Self-pay | Admitting: Diagnostic Neuroimaging

## 2013-09-11 ENCOUNTER — Emergency Department (HOSPITAL_COMMUNITY): Payer: Medicare Other

## 2013-09-11 ENCOUNTER — Encounter (HOSPITAL_COMMUNITY): Payer: Self-pay | Admitting: Emergency Medicine

## 2013-09-11 ENCOUNTER — Inpatient Hospital Stay (HOSPITAL_COMMUNITY)
Admission: EM | Admit: 2013-09-11 | Discharge: 2013-09-16 | DRG: 064 | Disposition: A | Discharge status: Skilled Nursing Facility | Payer: Medicare Other | Attending: Internal Medicine | Admitting: Internal Medicine

## 2013-09-11 DIAGNOSIS — Z91018 Allergy to other foods: Secondary | ICD-10-CM

## 2013-09-11 DIAGNOSIS — R609 Edema, unspecified: Secondary | ICD-10-CM

## 2013-09-11 DIAGNOSIS — Z885 Allergy status to narcotic agent status: Secondary | ICD-10-CM

## 2013-09-11 DIAGNOSIS — F17200 Nicotine dependence, unspecified, uncomplicated: Secondary | ICD-10-CM | POA: Diagnosis present

## 2013-09-11 DIAGNOSIS — E785 Hyperlipidemia, unspecified: Secondary | ICD-10-CM | POA: Diagnosis present

## 2013-09-11 DIAGNOSIS — Z8249 Family history of ischemic heart disease and other diseases of the circulatory system: Secondary | ICD-10-CM | POA: Diagnosis not present

## 2013-09-11 DIAGNOSIS — Z7982 Long term (current) use of aspirin: Secondary | ICD-10-CM | POA: Diagnosis not present

## 2013-09-11 DIAGNOSIS — Z880 Allergy status to penicillin: Secondary | ICD-10-CM

## 2013-09-11 DIAGNOSIS — M7989 Other specified soft tissue disorders: Secondary | ICD-10-CM

## 2013-09-11 DIAGNOSIS — I739 Peripheral vascular disease, unspecified: Secondary | ICD-10-CM | POA: Diagnosis present

## 2013-09-11 DIAGNOSIS — F1010 Alcohol abuse, uncomplicated: Secondary | ICD-10-CM | POA: Diagnosis present

## 2013-09-11 DIAGNOSIS — J449 Chronic obstructive pulmonary disease, unspecified: Secondary | ICD-10-CM | POA: Diagnosis present

## 2013-09-11 DIAGNOSIS — Z823 Family history of stroke: Secondary | ICD-10-CM | POA: Diagnosis not present

## 2013-09-11 DIAGNOSIS — I6350 Cerebral infarction due to unspecified occlusion or stenosis of unspecified cerebral artery: Secondary | ICD-10-CM

## 2013-09-11 DIAGNOSIS — G40909 Epilepsy, unspecified, not intractable, without status epilepticus: Secondary | ICD-10-CM | POA: Diagnosis present

## 2013-09-11 DIAGNOSIS — M129 Arthropathy, unspecified: Secondary | ICD-10-CM | POA: Diagnosis present

## 2013-09-11 DIAGNOSIS — R4182 Altered mental status, unspecified: Secondary | ICD-10-CM | POA: Insufficient documentation

## 2013-09-11 DIAGNOSIS — R569 Unspecified convulsions: Secondary | ICD-10-CM

## 2013-09-11 DIAGNOSIS — R9431 Abnormal electrocardiogram [ECG] [EKG]: Secondary | ICD-10-CM | POA: Diagnosis present

## 2013-09-11 DIAGNOSIS — G40802 Other epilepsy, not intractable, without status epilepticus: Secondary | ICD-10-CM | POA: Diagnosis present

## 2013-09-11 DIAGNOSIS — I6330 Cerebral infarction due to thrombosis of unspecified cerebral artery: Secondary | ICD-10-CM | POA: Diagnosis not present

## 2013-09-11 DIAGNOSIS — T148XXA Other injury of unspecified body region, initial encounter: Secondary | ICD-10-CM

## 2013-09-11 DIAGNOSIS — L24A9 Irritant contact dermatitis due friction or contact with other specified body fluids: Secondary | ICD-10-CM

## 2013-09-11 DIAGNOSIS — G9340 Encephalopathy, unspecified: Secondary | ICD-10-CM | POA: Diagnosis present

## 2013-09-11 DIAGNOSIS — IMO0002 Reserved for concepts with insufficient information to code with codable children: Secondary | ICD-10-CM | POA: Diagnosis not present

## 2013-09-11 DIAGNOSIS — G9341 Metabolic encephalopathy: Secondary | ICD-10-CM | POA: Diagnosis present

## 2013-09-11 DIAGNOSIS — Z8673 Personal history of transient ischemic attack (TIA), and cerebral infarction without residual deficits: Secondary | ICD-10-CM

## 2013-09-11 DIAGNOSIS — I10 Essential (primary) hypertension: Secondary | ICD-10-CM | POA: Diagnosis present

## 2013-09-11 DIAGNOSIS — F528 Other sexual dysfunction not due to a substance or known physiological condition: Secondary | ICD-10-CM

## 2013-09-11 DIAGNOSIS — T4275XA Adverse effect of unspecified antiepileptic and sedative-hypnotic drugs, initial encounter: Secondary | ICD-10-CM | POA: Diagnosis present

## 2013-09-11 DIAGNOSIS — I724 Aneurysm of artery of lower extremity: Secondary | ICD-10-CM | POA: Diagnosis present

## 2013-09-11 DIAGNOSIS — E871 Hypo-osmolality and hyponatremia: Secondary | ICD-10-CM | POA: Diagnosis present

## 2013-09-11 DIAGNOSIS — I639 Cerebral infarction, unspecified: Secondary | ICD-10-CM | POA: Diagnosis present

## 2013-09-11 DIAGNOSIS — F1210 Cannabis abuse, uncomplicated: Secondary | ICD-10-CM | POA: Diagnosis present

## 2013-09-11 DIAGNOSIS — J4489 Other specified chronic obstructive pulmonary disease: Secondary | ICD-10-CM | POA: Diagnosis present

## 2013-09-11 DIAGNOSIS — I7092 Chronic total occlusion of artery of the extremities: Secondary | ICD-10-CM

## 2013-09-11 DIAGNOSIS — E669 Obesity, unspecified: Secondary | ICD-10-CM | POA: Diagnosis present

## 2013-09-11 DIAGNOSIS — Z833 Family history of diabetes mellitus: Secondary | ICD-10-CM | POA: Diagnosis not present

## 2013-09-11 DIAGNOSIS — R410 Disorientation, unspecified: Secondary | ICD-10-CM

## 2013-09-11 DIAGNOSIS — Z48812 Encounter for surgical aftercare following surgery on the circulatory system: Secondary | ICD-10-CM

## 2013-09-11 LAB — COMPREHENSIVE METABOLIC PANEL
ALT: 21 U/L (ref 0–53)
AST: 20 U/L (ref 0–37)
Albumin: 3.4 g/dL — ABNORMAL LOW (ref 3.5–5.2)
Alkaline Phosphatase: 103 U/L (ref 39–117)
Anion gap: 11 (ref 5–15)
BUN: 10 mg/dL (ref 6–23)
CO2: 25 meq/L (ref 19–32)
Calcium: 8.7 mg/dL (ref 8.4–10.5)
Chloride: 93 mEq/L — ABNORMAL LOW (ref 96–112)
Creatinine, Ser: 0.73 mg/dL (ref 0.50–1.35)
GFR calc Af Amer: >90 mL/min (ref >90)
Glucose, Bld: 84 mg/dL (ref 70–99)
Potassium: 4.2 mEq/L (ref 3.7–5.3)
SODIUM: 129 meq/L — AB (ref 137–147)
Total Bilirubin: 0.3 mg/dL (ref 0.3–1.2)
Total Protein: 6.7 g/dL (ref 6.0–8.3)

## 2013-09-11 LAB — CBC
HCT: 40.9 % (ref 39.0–52.0)
Hemoglobin: 14.8 g/dL (ref 13.0–17.0)
MCH: 34.2 pg — ABNORMAL HIGH (ref 26.0–34.0)
MCHC: 36.2 g/dL — ABNORMAL HIGH (ref 30.0–36.0)
MCV: 94.5 fL (ref 78.0–100.0)
PLATELETS: 270 10*3/uL (ref 150–400)
RBC: 4.33 MIL/uL (ref 4.22–5.81)
RDW: 13.7 % (ref 11.5–15.5)
WBC: 10.9 10*3/uL — ABNORMAL HIGH (ref 4.0–10.5)

## 2013-09-11 LAB — RAPID URINE DRUG SCREEN, HOSP PERFORMED
Amphetamines: NOT DETECTED
BARBITURATES: NOT DETECTED
BENZODIAZEPINES: NOT DETECTED
COCAINE: NOT DETECTED
OPIATES: NOT DETECTED
Tetrahydrocannabinol: NOT DETECTED

## 2013-09-11 LAB — CBG MONITORING, ED
GLUCOSE-CAPILLARY: 93 mg/dL (ref 70–99)
Glucose-Capillary: 96 mg/dL (ref 70–99)

## 2013-09-11 LAB — ETHANOL

## 2013-09-11 LAB — URINALYSIS, ROUTINE W REFLEX MICROSCOPIC
Bilirubin Urine: NEGATIVE
GLUCOSE, UA: NEGATIVE mg/dL
HGB URINE DIPSTICK: NEGATIVE
Ketones, ur: NEGATIVE mg/dL
Leukocytes, UA: NEGATIVE
Nitrite: NEGATIVE
Protein, ur: NEGATIVE mg/dL
SPECIFIC GRAVITY, URINE: 1.023 (ref 1.005–1.030)
Urobilinogen, UA: 1 mg/dL (ref 0.0–1.0)
pH: 6 (ref 5.0–8.0)

## 2013-09-11 LAB — SODIUM, URINE, RANDOM: SODIUM UR: 54 meq/L

## 2013-09-11 LAB — TROPONIN I: Troponin I: <0.3 ng/mL (ref <0.30)

## 2013-09-11 LAB — CARBAMAZEPINE LEVEL, TOTAL: CARBAMAZEPINE LVL: 9.3 ug/mL (ref 4.0–12.0)

## 2013-09-11 MED ORDER — GABAPENTIN 600 MG PO TABS
600.0000 mg | ORAL_TABLET | Freq: Three times a day (TID) | ORAL | Status: DC
  Administered 2013-09-12 – 2013-09-16 (×15): 600 mg via ORAL
  Filled 2013-09-11 (×16): qty 1

## 2013-09-11 MED ORDER — SENNOSIDES-DOCUSATE SODIUM 8.6-50 MG PO TABS
1.0000 | ORAL_TABLET | Freq: Every evening | ORAL | Status: DC | PRN
  Filled 2013-09-11: qty 1

## 2013-09-11 MED ORDER — ENOXAPARIN SODIUM 40 MG/0.4ML ~~LOC~~ SOLN
40.0000 mg | Freq: Every day | SUBCUTANEOUS | Status: DC
  Administered 2013-09-12 – 2013-09-16 (×5): 40 mg via SUBCUTANEOUS
  Filled 2013-09-11 (×5): qty 0.4

## 2013-09-11 MED ORDER — LISINOPRIL 20 MG PO TABS
20.0000 mg | ORAL_TABLET | Freq: Every day | ORAL | Status: DC
  Administered 2013-09-12 – 2013-09-16 (×5): 20 mg via ORAL
  Filled 2013-09-11 (×5): qty 1

## 2013-09-11 MED ORDER — SODIUM CHLORIDE 0.9 % IJ SOLN
3.0000 mL | Freq: Two times a day (BID) | INTRAMUSCULAR | Status: DC
  Administered 2013-09-12 – 2013-09-16 (×10): 3 mL via INTRAVENOUS

## 2013-09-11 MED ORDER — LORAZEPAM 2 MG/ML IJ SOLN: 1.0000 mg | INTRAMUSCULAR | Status: DC | PRN

## 2013-09-11 MED ORDER — STROKE: EARLY STAGES OF RECOVERY BOOK
Freq: Once | Status: AC
  Administered 2013-09-12: 01:00:00
  Filled 2013-09-11: qty 1

## 2013-09-11 MED ORDER — ASPIRIN 300 MG RE SUPP
300.0000 mg | Freq: Every day | RECTAL | Status: DC
  Filled 2013-09-11 (×5): qty 1

## 2013-09-11 MED ORDER — CARBAMAZEPINE 200 MG PO TABS
400.0000 mg | ORAL_TABLET | Freq: Three times a day (TID) | ORAL | Status: DC
  Administered 2013-09-12 – 2013-09-16 (×14): 400 mg via ORAL
  Filled 2013-09-11 (×17): qty 2

## 2013-09-11 MED ORDER — ASPIRIN 325 MG PO TABS
325.0000 mg | ORAL_TABLET | Freq: Every day | ORAL | Status: DC
Start: 2013-09-12 — End: 2013-09-16
  Administered 2013-09-12 – 2013-09-16 (×5): 325 mg via ORAL
  Filled 2013-09-11 (×5): qty 1

## 2013-09-11 MED ORDER — LEVETIRACETAM 500 MG PO TABS
1000.0000 mg | ORAL_TABLET | Freq: Two times a day (BID) | ORAL | Status: DC
  Administered 2013-09-12 – 2013-09-16 (×10): 1000 mg via ORAL
  Filled 2013-09-11 (×11): qty 2

## 2013-09-11 NOTE — ED Notes (Signed)
Attempted report 

## 2013-09-11 NOTE — ED Notes (Signed)
Paged IV team 

## 2013-09-11 NOTE — Progress Notes (Signed)
Report taken from [place], [place] in ED. Pt currently in MRI per RN.
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
## 2013-09-11 NOTE — ED Notes (Signed)
Pt arrives via POV from home with 2 seizures on Monday. Pts brother reports patient has been confused, forgetful, sleeping excessively. Pt denies fever, pain. Pt awake, alert, oriented x3, disoriented to time. Possibly not taking his blood pressure medication recently.

## 2013-09-11 NOTE — ED Notes (Signed)
Family states that the patient has become more disoriented and forgetful over the last week. States that the patient has become more incoherent.  Patient does not remember having any seizures recently but family reports that the patient had 2 seizures on Monday.  Patient reports that he is taking his medications but family reports that he is not.

## 2013-09-11 NOTE — Telephone Encounter (Signed)
I called and spoke to [place], brother of pt. Relayed that spoke to Dr. Marjory Lies .  Spoke to brother,  since confusion, and disorientation has not changed.  I told brother to take him to ER for evaluation, will evaluate and most likely get labs, maybe CT/MRI.  Relayed had seizures on Monday per previous note.  Could have still mild confusion from seizures, but per brother seems more, would take to ER for eval.   He stated he would check with his brother.  I relayed if pt disoriented, confused he may not need to make decision.  I again stated to go to any ER for evaluation.   He verbalized understanding.

## 2013-09-11 NOTE — Telephone Encounter (Signed)
Patient's brother Maurine Minister @ [phone], stated patient is disoriented, talking out of his head, and sleeps a lot.  Where instructed by on call doctor last night to go to ER.  Went to ER didn't stay due to long wait.  Questioning if patient could be seen today?  Please call anytime and may leave detailed message if not available.
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
## 2013-09-12 ENCOUNTER — Inpatient Hospital Stay (HOSPITAL_COMMUNITY): Payer: Medicare Other

## 2013-09-12 DIAGNOSIS — I10 Essential (primary) hypertension: Secondary | ICD-10-CM

## 2013-09-12 DIAGNOSIS — R404 Transient alteration of awareness: Secondary | ICD-10-CM

## 2013-09-12 DIAGNOSIS — F17200 Nicotine dependence, unspecified, uncomplicated: Secondary | ICD-10-CM

## 2013-09-12 LAB — CBC WITH DIFFERENTIAL/PLATELET
BASOS ABS: 0 10*3/uL (ref 0.0–0.1)
Basophils Relative: 0 % (ref 0–1)
Eosinophils Absolute: 0.2 10*3/uL (ref 0.0–0.7)
Eosinophils Relative: 2 % (ref 0–5)
HEMATOCRIT: 39.4 % (ref 39.0–52.0)
Hemoglobin: 14.2 g/dL (ref 13.0–17.0)
Lymphocytes Relative: 24 % (ref 12–46)
Lymphs Abs: 2.5 10*3/uL (ref 0.7–4.0)
MCH: 34.1 pg — ABNORMAL HIGH (ref 26.0–34.0)
MCHC: 36 g/dL (ref 30.0–36.0)
MCV: 94.5 fL (ref 78.0–100.0)
MONO ABS: 1.3 10*3/uL — AB (ref 0.1–1.0)
Monocytes Relative: 12 % (ref 3–12)
NEUTROS ABS: 6.5 10*3/uL (ref 1.7–7.7)
Neutrophils Relative %: 62 % (ref 43–77)
Platelets: 254 10*3/uL (ref 150–400)
RBC: 4.17 MIL/uL — ABNORMAL LOW (ref 4.22–5.81)
RDW: 13.6 % (ref 11.5–15.5)
WBC: 10.5 10*3/uL (ref 4.0–10.5)

## 2013-09-12 LAB — COMPREHENSIVE METABOLIC PANEL
ALT: 19 U/L (ref 0–53)
AST: 18 U/L (ref 0–37)
Albumin: 3.2 g/dL — ABNORMAL LOW (ref 3.5–5.2)
Alkaline Phosphatase: 101 U/L (ref 39–117)
Anion gap: 11 (ref 5–15)
BILIRUBIN TOTAL: 0.3 mg/dL (ref 0.3–1.2)
BUN: 9 mg/dL (ref 6–23)
CHLORIDE: 94 meq/L — AB (ref 96–112)
CO2: 25 mEq/L (ref 19–32)
CREATININE: 0.73 mg/dL (ref 0.50–1.35)
Calcium: 8.6 mg/dL (ref 8.4–10.5)
GFR calc Af Amer: >90 mL/min (ref >90)
GFR calc non Af Amer: >90 mL/min (ref >90)
Glucose, Bld: 102 mg/dL — ABNORMAL HIGH (ref 70–99)
Potassium: 4 mEq/L (ref 3.7–5.3)
Sodium: 130 mEq/L — ABNORMAL LOW (ref 137–147)
TOTAL PROTEIN: 6.5 g/dL (ref 6.0–8.3)

## 2013-09-12 LAB — LIPID PANEL
CHOLESTEROL: 198 mg/dL (ref 0–200)
HDL: 44 mg/dL (ref >39)
LDL Cholesterol: 140 mg/dL — ABNORMAL HIGH (ref 0–99)
TRIGLYCERIDES: 72 mg/dL (ref <150)
Total CHOL/HDL Ratio: 4.5 RATIO
VLDL: 14 mg/dL (ref 0–40)

## 2013-09-12 LAB — CARBAMAZEPINE LEVEL, TOTAL: CARBAMAZEPINE LVL: 14.3 ug/mL — AB (ref 4.0–12.0)

## 2013-09-12 LAB — HEMOGLOBIN A1C
Hgb A1c MFr Bld: 6.1 % — ABNORMAL HIGH (ref <5.7)
Mean Plasma Glucose: 128 mg/dL — ABNORMAL HIGH (ref <117)

## 2013-09-12 LAB — OSMOLALITY, URINE: Osmolality, Ur: 731 mOsm/kg (ref 390–1090)

## 2013-09-12 MED ORDER — ATORVASTATIN CALCIUM 20 MG PO TABS
20.0000 mg | ORAL_TABLET | Freq: Every day | ORAL | Status: DC
  Administered 2013-09-12 – 2013-09-15 (×4): 20 mg via ORAL
  Filled 2013-09-12 (×5): qty 1

## 2013-09-12 MED ORDER — CLOPIDOGREL BISULFATE 75 MG PO TABS
75.0000 mg | ORAL_TABLET | Freq: Every day | ORAL | Status: DC
  Administered 2013-09-12 – 2013-09-16 (×5): 75 mg via ORAL
  Filled 2013-09-12 (×5): qty 1
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
## 2013-09-12 NOTE — Progress Notes (Signed)
PROGRESS NOTE  Lee [name] MVH:[id number] DOB: July 19, 1950 DOA: 09/11/2013 PCP: Lee Pitter, MD  Assessment/Plan: Acute encephalopathy - -?secondary to acute CVA.  -seizure.  -neuro advised further stroke workup -carotid Dopplers and 2-D echo.  -PT/OT consult and aspirin. Check hemoglobin A1c lipid panel.  Acute CVA - see #1.  Seizure disorder - patient's Keppra dose has been increased to 1000 mg twice daily. Neurologist has recommended to continue the same dose of Neurontin and carbamazepine.  Hyponatremia - patient wife states patient drinks a lot of water. Check urine studies for urine sodium and osmolality. Check TSH. Check next chest x-ray. Closely follow metabolic panel. Patient may need fluid restriction.  COPD - presently not wheezing. X ray ok  Hypertension - continue present medication.  Tobacco abuse - tobacco cessation counseling requested  Abnormal EKG with ST depression inferior leads and nonspecific ST-T changes in lateral leads - was discussed with on-call cardiologist. Check 2-D echo and troponin. Patient has no chest pain. EKG changes may be related to stroke.  Peripheral vascular disease status post bypass - no acute issues. On aspirin.   Code Status: full Family Communication: patient, called wife Disposition Plan:    Consultants:  neurology  Procedures:      HPI/Subjective: Confused- repeating same answer over and over- "it's Saturday and Sunday"  Objective: Filed Vitals:   09/12/13 0622  BP: 126/68  Pulse: 63  Temp: 98.9 F (37.2 C)  Resp:     Intake/Output Summary (Last 24 hours) at 09/12/13 0814 Last data filed at 09/12/13 0050  Gross per 24 hour  Intake    240 ml  Output      0 ml  Net    240 ml   Filed Weights   09/11/13 2342 09/12/13 0536  Weight: 78.2 kg (172 lb 6.4 oz) 78.2 kg (172 lb 6.4 oz)    Exam:   General:  Pleasantly confused  Cardiovascular: rrr  Respiratory: clear  Abdomen: +BS,  soft  Musculoskeletal: no edema  Data Reviewed: Basic Metabolic Panel:  Recent Labs Lab 09/11/13 1600 09/12/13 0333  NA 129* 130*  K 4.2 4.0  CL 93* 94*  CO2 25 25  GLUCOSE 84 102*  BUN 10 9  CREATININE 0.73 0.73  CALCIUM 8.7 8.6   Liver Function Tests:  Recent Labs Lab 09/11/13 1600 09/12/13 0333  AST 20 18  ALT 21 19  ALKPHOS 103 101  BILITOT 0.3 0.3  PROT 6.7 6.5  ALBUMIN 3.4* 3.2*   No results found for this basename: LIPASE, AMYLASE,  in the last 168 hours No results found for this basename: AMMONIA,  in the last 168 hours CBC:  Recent Labs Lab 09/11/13 1600 09/12/13 0333  WBC 10.9* 10.5  NEUTROABS  --  6.5  HGB 14.8 14.2  HCT 40.9 39.4  MCV 94.5 94.5  PLT 270 254   Cardiac Enzymes:  Recent Labs Lab 09/11/13 2234  TROPONINI <0.30   BNP (last 3 results) No results found for this basename: PROBNP,  in the last 8760 hours CBG:  Recent Labs Lab 09/11/13 1517 09/11/13 1548  GLUCAP 96 93    No results found for this or any previous visit (from the past 240 hour(s)).   Studies: Ct Head Wo Contrast  09/11/2013   CLINICAL DATA:  Altered mental status, disoriented  EXAM: CT HEAD WITHOUT CONTRAST  TECHNIQUE: Contiguous axial images were obtained from the base of the skull through the vertex without intravenous contrast.  COMPARISON:  None.  FINDINGS: Moderate diffuse atrophy. No hemorrhage or extra-axial fluid. No evidence of vascular territory infarct. Mild low attenuation in the deep periventricular white matter consistent with chronic involutional change. Prominent ovoid soft tissue in the sella turcica measuring 16 x 7 mm. Numerous lytic areas throughout the calvarium, all measuring less than or equal to 8 mm. No significant inflammatory change in the visualized portions of the sinuses.  IMPRESSION: 1. Age-related involutional change 2. Soft tissue prominence in the sella turcica. Mass is not excluded. Recommend MRI preferably with contrast. 3.  Numerous rounded lytic lesions in the calvarium. Lytic metastases not excluded.   Electronically Signed   By: Esperanza Heir M.D.   On: 09/11/2013 17:38   Mr Lee [name] Head Wo Contrast  09/11/2013   CLINICAL DATA:  Altered mental status.  EXAM: MRI HEAD WITHOUT CONTRAST  MRA HEAD WITHOUT CONTRAST  TECHNIQUE: Multiplanar, multiecho pulse sequences of the brain and surrounding structures were obtained without intravenous contrast. Angiographic images of the head were obtained using MRA technique without contrast.  COMPARISON:  Head CT 09/11/2013  FINDINGS: MRI HEAD FINDINGS  IV access could not be obtained despite multiple attempts, and therefore contrast could not be administered.  Pituitary is within normal limits for size without gross sellar or suprasellar mass on this nondedicated, noncontrast examination.  There is a 1 cm acute infarct in the anterior left thalamus. There is no intracranial hemorrhage, mass, midline shift, or extra-axial fluid collection. Moderate generalized cerebral atrophy is present. Patchy T2 hyperintensities in the subcortical and deep cerebral white matter bilaterally are nonspecific but compatible with mild-to-moderate chronic small vessel ischemic disease. An old infarct is noted in the pons. Old lacunar infarct is also present in the right thalamus.  Orbits are unremarkable. Paranasal sinuses and mastoid air cells are clear. Distal left vertebral artery flow void is abnormal and may be occluded in the neck. Other major intracranial vascular flow voids are preserved.  MRA HEAD FINDINGS  The visualized distal right vertebral artery is patent and dominant and supplies the basilar. There is flow related enhancement in the distal intracranial portion of the left vertebral artery, however the more proximal intracranial left vertebral artery is incompletely imaged but appears irregular and small in caliber, possibly occluded or severely stenotic more proximally given appearance on T2 weighted  brain images. PICA origins are patent bilaterally. AICA and SCA origins are patent. Basilar artery is patent without stenosis. PCAs are unremarkable aside from mild branch vessel irregularity.  Internal carotid arteries are patent from skullbase to carotid termini. Right MCA and both ACAs are unremarkable. Diminished flow related enhancement in the region of the left MCA bifurcation is favored to be artifactual. No intracranial aneurysm is identified.  IMPRESSION: 1. Small, acute infarct in the left thalamus. 2. Old infarcts in the right thalamus and pons. 3. Mild-to-moderate chronic small vessel ischemic disease. 4. Abnormal appearance of the distal left vertebral artery, incompletely evaluated but compatible with either occlusion or high-grade stenosis in the neck.   Electronically Signed   By: Sebastian Ache   On: 09/11/2013 22:19   Mr Brain Wo Contrast  09/11/2013   CLINICAL DATA:  Altered mental status.  EXAM: MRI HEAD WITHOUT CONTRAST  MRA HEAD WITHOUT CONTRAST  TECHNIQUE: Multiplanar, multiecho pulse sequences of the brain and surrounding structures were obtained without intravenous contrast. Angiographic images of the head were obtained using MRA technique without contrast.  COMPARISON:  Head CT 09/11/2013  FINDINGS: MRI HEAD FINDINGS  IV access could not be  obtained despite multiple attempts, and therefore contrast could not be administered.  Pituitary is within normal limits for size without gross sellar or suprasellar mass on this nondedicated, noncontrast examination.  There is a 1 cm acute infarct in the anterior left thalamus. There is no intracranial hemorrhage, mass, midline shift, or extra-axial fluid collection. Moderate generalized cerebral atrophy is present. Patchy T2 hyperintensities in the subcortical and deep cerebral white matter bilaterally are nonspecific but compatible with mild-to-moderate chronic small vessel ischemic disease. An old infarct is noted in the pons. Old lacunar infarct is  also present in the right thalamus.  Orbits are unremarkable. Paranasal sinuses and mastoid air cells are clear. Distal left vertebral artery flow void is abnormal and may be occluded in the neck. Other major intracranial vascular flow voids are preserved.  MRA HEAD FINDINGS  The visualized distal right vertebral artery is patent and dominant and supplies the basilar. There is flow related enhancement in the distal intracranial portion of the left vertebral artery, however the more proximal intracranial left vertebral artery is incompletely imaged but appears irregular and small in caliber, possibly occluded or severely stenotic more proximally given appearance on T2 weighted brain images. PICA origins are patent bilaterally. AICA and SCA origins are patent. Basilar artery is patent without stenosis. PCAs are unremarkable aside from mild branch vessel irregularity.  Internal carotid arteries are patent from skullbase to carotid termini. Right MCA and both ACAs are unremarkable. Diminished flow related enhancement in the region of the left MCA bifurcation is favored to be artifactual. No intracranial aneurysm is identified.  IMPRESSION: 1. Small, acute infarct in the left thalamus. 2. Old infarcts in the right thalamus and pons. 3. Mild-to-moderate chronic small vessel ischemic disease. 4. Abnormal appearance of the distal left vertebral artery, incompletely evaluated but compatible with either occlusion or high-grade stenosis in the neck.   Electronically Signed   By: Sebastian Ache   On: 09/11/2013 22:19    Scheduled Meds: . aspirin  300 mg Rectal Daily   Or  . aspirin  325 mg Oral Daily  . carbamazepine  400 mg Oral TID  . enoxaparin (LOVENOX) injection  40 mg Subcutaneous Daily  . gabapentin  600 mg Oral TID  . levETIRAcetam  1,000 mg Oral BID  . lisinopril  20 mg Oral Daily  . sodium chloride  3 mL Intravenous Q12H   Continuous Infusions:  Antibiotics Given (last 72 hours)   None      Principal  Problem:   Encephalopathy acute Active Problems:   HYPERTENSION   Seizure disorder   CVA (cerebral infarction)   Hyponatremia   Acute encephalopathy    Time spent: 35 min    Lee [name]  [facility] Pager [phone]. If 7PM-7AM, please contact night-coverage at [email or web address], password [email or web address] 09/12/2013, 8:14 AM  LOS: 1 day

## 2013-09-12 NOTE — Procedures (Signed)
History: 63 year-old male with altered mental status following stroke  Sedation: None  Technique: This is a 17 channel routine scalp EEG performed at the bedside with bipolar and monopolar montages arranged in accordance to the international 10/20 system of electrode placement. One channel was dedicated to EKG recording.    Background: The background consists of generalized irregular delta and theta activity. There is a well defined posterior dominant rhythm of 7 Hz that attenuates with eye opening.   Photic stimulation: Physiologic driving is now performed  EEG Abnormalities: 1) generalized irregular delta activity 2) slow PDR  Clinical Interpretation: This EEG is consistent with a left hemispheric cerebral dysfunction in the setting of a more generalized nonspecific cerebral dysfunction (encephalopathy). There was no seizure or seizure predisposition recorded on this study.   Ritta Slot, MD [facility] [phone]  If 7pm- 7am, please page neurology on call as listed in AMION.

## 2013-09-12 NOTE — Plan of Care (Signed)
Problem: Acute Rehab PT Goals(only PT should resolve) Goal: Pt Will Ambulate Level surfaces     

## 2013-09-12 NOTE — Progress Notes (Signed)
*  PRELIMINARY RESULTS* Vascular Ultrasound Carotid Duplex (Doppler) has been completed. Findings suggest 1-39% internal carotid artery stenosis bilaterally. The right vertebral artery is patent with antegrade flow. Unable to visualize the left vertebral artery.  09/12/2013 3:11 PM Gertie Fey, RVT, RDCS, RDMS

## 2013-09-12 NOTE — Progress Notes (Signed)
STROKE TEAM PROGRESS NOTE   HISTORY Lee [name] is an 63 y.o. male with a history of stroke, seizure disorder, hypertension, peripheral vascular disease, COPD and alcohol abuse, presenting with altered mental status since 09/07/2013. Patient reportedly had 2 grand mal seizures on 09/07/2013 and has remained confused. He reportedly had missed doses of seizure medication and also has not been taking antihypertensive medication. He has been prescribed Tegretol 400 mg 3 times a day, Keppra 750 mg twice a day and Neurontin 600 mg 3 times a day. His Tegretol level in the emergency room was 9.3. CT scan of his head showed no acute intracranial abnormality. Soft tissue prominence in the sella turcica was noted as well as numerous round lytic lesions involving the calvarium. He was noted also to have hyponatremia with sodium of 129. His wife indicates that he has been drinking very large volumes of water lately. No seizure activity been reported since 09/07/2013. Patient no longer drinks alcohol.    SUBJECTIVE (INTERVAL HISTORY) His wife is at the bedside.  Overall he feels his condition is unchanged. She still has intermittent confusion as well as short-term memory difficulties which have not improved. His long-term memory seems to be all right. He has not had any further seizure episodes   OBJECTIVE Temp:  [97.6 F (36.4 C)-98.9 F (37.2 C)] 98.9 F (37.2 C) (09/05 0622) Pulse Rate:  [53-69] 63 (09/05 0622) Cardiac Rhythm:  [-]  Resp:  [14-18] 18 (09/04 2342) BP: (120-167)/(63-83) 126/68 mmHg (09/05 0622) SpO2:  [94 %-100 %] 97 % (09/05 0622) Weight:  [172 lb 6.4 oz (78.2 kg)] 172 lb 6.4 oz (78.2 kg) (09/05 0536)   Recent Labs Lab 09/11/13 1517 09/11/13 1548  GLUCAP 96 93    Recent Labs Lab 09/11/13 1600 09/12/13 0333  NA 129* 130*  K 4.2 4.0  CL 93* 94*  CO2 25 25  GLUCOSE 84 102*  BUN 10 9  CREATININE 0.73 0.73  CALCIUM 8.7 8.6    Recent Labs Lab 09/11/13 1600  09/12/13 0333  AST 20 18  ALT 21 19  ALKPHOS 103 101  BILITOT 0.3 0.3  PROT 6.7 6.5  ALBUMIN 3.4* 3.2*    Recent Labs Lab 09/11/13 1600 09/12/13 0333  WBC 10.9* 10.5  NEUTROABS  --  6.5  HGB 14.8 14.2  HCT 40.9 39.4  MCV 94.5 94.5  PLT 270 254    Recent Labs Lab 09/11/13 2234  TROPONINI <0.30   No results found for this basename: LABPROT, INR,  in the last 72 hours  Recent Labs  09/11/13 1759  COLORURINE AMBER*  LABSPEC 1.023  PHURINE 6.0  GLUCOSEU NEGATIVE  HGBUR NEGATIVE  BILIRUBINUR NEGATIVE  KETONESUR NEGATIVE  PROTEINUR NEGATIVE  UROBILINOGEN 1.0  NITRITE NEGATIVE  LEUKOCYTESUR NEGATIVE       Component Value Date/Time   CHOL 198 09/12/2013 0333   TRIG 72 09/12/2013 0333   HDL 44 09/12/2013 0333   CHOLHDL 4.5 09/12/2013 0333   VLDL 14 09/12/2013 0333   LDLCALC 140* 09/12/2013 0333   No results found for this basename: HGBA1C      Component Value Date/Time   LABOPIA NONE DETECTED 09/11/2013 1759   COCAINSCRNUR NONE DETECTED 09/11/2013 1759   LABBENZ NONE DETECTED 09/11/2013 1759   AMPHETMU NONE DETECTED 09/11/2013 1759   THCU NONE DETECTED 09/11/2013 1759   LABBARB NONE DETECTED 09/11/2013 1759     Recent Labs Lab 09/11/13 1618  ETH <11    Ct Head Wo Contrast 09/11/2013  1. Age-related involutional change  2. Soft tissue prominence in the sella turcica. Mass is not excluded. Recommend MRI preferably with contrast.  3. Numerous rounded lytic lesions in the calvarium. Lytic metastases not excluded.      MRI /  Mra Head Wo Contrast 09/11/2013    1. Small, acute infarct in the left thalamus.  2. Old infarcts in the right thalamus and pons. 3. Mild-to-moderate chronic small vessel ischemic disease.  4. Abnormal appearance of the distal left vertebral artery, incompletely evaluated but compatible with either occlusion or high-grade stenosis in the neck.       PHYSICAL EXAM Frail middle-aged African American gentleman currently not in distress. Sitting up in  bed.Awake alert. Afebrile. Head is nontraumatic. Neck is supple without bruit. Hearing is normal. Cardiac exam no murmur or gallop. Lungs are clear to auscultation. Distal pulses are well felt. Neurological Exam : Awake alert disoriented. Diminished attention, registration and recall. Poor short-term memory. Speech is fluent without aphasia. Extraocular movements are full range without nystagmus. Blinks to threat bilaterally. Fundi were not visualized. Face is symmetric without weakness. No upper or lower extremity drift. Diminished fine finger movements on the right. Orbits left-to-right upper extremity. Gait was not tested. ASSESSMENT/PLAN  [name] is a 63 y.o. male with   presenting with recurrent seizures followed by confusion and a sedimentation secondary to left medial thalamic infarct etiology likely small vessel disease. He did not not receive IV t-PA due to delay in arrival and failure to recognize due  to   onset with seizure. Imaging confirms a left medial thalamic infarct. CT head reportedly shows small lytic lesions in the skull felt to be metastasis but MRI scan seems fairly unremarkable making those lesions questionable Stroke work up underway.      On aspirin 81 mg prior to admission, now on Plavix 75 mg n  MRI  left medial thalamic infarct and old infarcts in right thalamus and pons MRA  Abnormal appearance of the distal left vertebral artery,  incompletely evaluated but compatible with either occlusion or  high-grade stenosis in the neck    Carotid Doppler  pending  2D Echo  pending  LDL140mg  %, no statin prior to admission goal of LDL <100-will start Lipitor 20 mg  HgbA1c pending  Lovenox for VTE prophylaxis  Cardiac diet.     Up with assistance    Therapy needs:  Pending  Ongoing aggressive risk factor management  Risk factor education  Patient counseled to be compliant with his antithrombotic medications  Disposition:  Home  likely  Hypertension   Home meds:Lisinopril 20 mg Resumed in hospital  BP 115-167/71-83 past 24h  SBP goal below 200/110   Stable   Patient counseled to be compliant with his blood pressure medications  Hyperlipidemia  Home meds:  None. Started Lipitor in hospital )  LDL 140  LDL goal < 100 (<70 for diabetics)    Other Stroke Risk Factors      Obesity, Body mass index is 24.74 kg/(m^2).     Hx strok     Family hx stroke in Dad Peripheral arterial disease   Other Active Problems  Epilepsy with breakthru seizures- Continue carbamezapine and keppra in home dosages.  Check carbamezapine level   Mild Hyponatremia - likley carbamezapine effect  Other Pertinent History   Hospital day # 1  Delia Heady, MD  09/12/2013, 8:04 AM     To contact Stroke Continuity provider, please refer to [email or web address]. After hours, contact  General Neurology

## 2013-09-12 NOTE — Evaluation (Signed)
Physical Therapy Evaluation Patient Details Name: Lee [name] MRN: [id number] DOB: [birth year] Today's Date: 09/12/2013   History of Present Illness  Lee [name] is a 63 y.o. male with history of seizure, experienced 2 episodes of grand mal seizure on September 07, 2013. Since the seizure patient has been confused and has not come back to his baseline. CT head showed soft tissue prominence in the sella Turcica and lytic lesions. MRI brain shows acute left thalamic infarct.    Clinical Impression  Pt is motivated to work and demonstrates a good effort to use both walker and cane, but cane is not safe alone yet.  Pt and wife were recommended to let HHPT come visit and to use walker with nsg staff until [facility] becomes a safer assisted option.  Will need to climb steps next visit with PT.    Follow Up Recommendations Home health PT;Supervision/Assistance - 24 hour    Equipment Recommendations  Rolling walker with 5" wheels;Other (comment) (determine at last PT visit depending on functional level )    Recommendations for Other Services Other (comment) (HHPT )     Precautions / Restrictions Precautions Precautions: Fall Restrictions Weight Bearing Restrictions: No      Mobility  Bed Mobility Overal bed mobility: Modified Independent                Transfers Overall transfer level: Needs assistance Equipment used: Rolling walker (2 wheeled);Straight cane Transfers: Sit to/from [employer] Sit to Stand: Min guard Stand pivot transfers: Min guard       General transfer comment: turns quickly to set up for transition unless cued for slower speed, cued hand placement 100% or did not use hands to transition  Ambulation/Gait Ambulation/Gait assistance: Min guard;Min assist Ambulation Distance (Feet): 200 Feet Assistive device: Rolling walker (2 wheeled);Straight cane Gait Pattern/deviations: Step-through pattern;Decreased step length - right;Decreased  step length - left;Shuffle;Drifts right/left;Wide base of support Gait velocity: reduced Gait velocity interpretation: Below normal speed for age/gender General Gait Details: high level balance challenges with use of cane to maneuver around obstacles, improvement on walker, closer assist on cane  Stairs            Wheelchair Mobility    Modified Rankin (Stroke Patients Only) Modified Rankin (Stroke Patients Only) Pre-Morbid Rankin Score: Slight disability Modified Rankin: Moderate disability     Balance Overall balance assessment: Needs assistance Sitting-balance support: Feet supported Sitting balance-Leahy Scale: Fair   Postural control: Posterior lean Standing balance support: Bilateral upper extremity supported;No upper extremity supported Standing balance-Leahy Scale: Poor                   Standardized Balance Assessment Standardized Balance Assessment :  (Parts of FGA)           Pertinent Vitals/Pain Pain Assessment: No/denies pain    Home Living Family/patient expects to be discharged to:: Private residence Living Arrangements: Spouse/significant other Available Help at Discharge: Family Type of Home: House Home Access: Stairs to enter Entrance Stairs-Rails: Right Entrance Stairs-Number of Steps: 3 Home Layout: One level Home Equipment: Cane - single point      Prior Function Level of Independence: Independent with assistive device(s)               Hand Dominance   Dominant Hand: Right    Extremity/Trunk Assessment   Upper Extremity Assessment: Overall WFL for tasks assessed           Lower Extremity Assessment: Generalized weakness  Cervical / Trunk Assessment: Normal  Communication   Communication: No difficulties  Cognition Arousal/Alertness: Awake/alert Behavior During Therapy: WFL for tasks assessed/performed Overall Cognitive Status: Within Functional Limits for tasks assessed                       General Comments General comments (skin integrity, edema, etc.): Safety with his walker much better, wife in to ask about him going through smoking cessation and will ask nsg to discuss this    Exercises        Assessment/Plan    PT Assessment Patient needs continued PT services  PT Diagnosis Abnormality of gait   PT Problem List Decreased strength;Decreased range of motion;Decreased activity tolerance;Decreased mobility;Decreased balance;Decreased coordination;Decreased knowledge of use of DME;Decreased safety awareness;Cardiopulmonary status limiting activity  PT Treatment Interventions DME instruction;Gait training;Stair training;Functional mobility training;Therapeutic activities;Therapeutic exercise;Balance training;Neuromuscular re-education;Patient/family education   PT Goals (Current goals can be found in the Care Plan section) Acute Rehab PT Goals Patient Stated Goal: to go home PT Goal Formulation: With patient/family Time For Goal Achievement: 09/19/13 Potential to Achieve Goals: Good    Frequency Min 3X/week   Barriers to discharge Inaccessible home environment;Decreased caregiver support Wife is working and not home 24/7    Co-evaluation               End of Session Equipment Utilized During Treatment: Gait belt Activity Tolerance: Patient tolerated treatment well Patient left: in chair;with call bell/phone within reach;with family/visitor present Nurse Communication: Mobility status;Other (comment) (smoking cessation)         Time: 1610-9604 PT Time Calculation (min): 23 min   Charges:   PT Evaluation $Initial PT Evaluation Tier I: 1 Procedure PT Treatments $Gait Training: 8-22 mins   PT G Codes:          [name] September 14, 2013, 3:13 PM  Samul Dada, PT MS Acute Rehab Dept. Number: [phone]

## 2013-09-12 NOTE — Progress Notes (Signed)
Routine adult EEG completed. Results pending. 

## 2013-09-13 DIAGNOSIS — I517 Cardiomegaly: Secondary | ICD-10-CM

## 2013-09-13 LAB — HEMOGLOBIN A1C
Hgb A1c MFr Bld: 6 % — ABNORMAL HIGH (ref <5.7)
MEAN PLASMA GLUCOSE: 126 mg/dL — AB (ref <117)

## 2013-09-13 MED ORDER — LEVETIRACETAM 1000 MG PO TABS: 1000.0000 mg | ORAL_TABLET | Freq: Two times a day (BID) | ORAL | Status: DC

## 2013-09-13 MED ORDER — ATORVASTATIN CALCIUM 20 MG PO TABS: 20.0000 mg | ORAL_TABLET | Freq: Every day | ORAL | Status: DC

## 2013-09-13 MED ORDER — CLOPIDOGREL BISULFATE 75 MG PO TABS: 75.0000 mg | ORAL_TABLET | Freq: Every day | ORAL | Status: DC

## 2013-09-13 NOTE — Progress Notes (Signed)
UR Completed.  Lee [name] [phone] 09/13/2013  

## 2013-09-13 NOTE — Progress Notes (Signed)
STROKE TEAM PROGRESS NOTE   HISTORY Lee [name] is an 63 y.o. male with a history of stroke, seizure disorder, hypertension, peripheral vascular disease, COPD and alcohol abuse, presenting with altered mental status since 09/07/2013. Patient reportedly had 2 grand mal seizures on 09/07/2013 and has remained confused. He reportedly had missed doses of seizure medication and also has not been taking antihypertensive medication. He has been prescribed Tegretol 400 mg 3 times a day, Keppra 750 mg twice a day and Neurontin 600 mg 3 times a day. Lee Tegretol level in the emergency room was 9.3. CT scan of Lee head showed no acute intracranial abnormality. Soft tissue prominence in the sella turcica was noted as well as numerous round lytic lesions involving the calvarium. He was noted also to have hyponatremia with sodium of 129. Lee [name] indicates that he has been drinking very large volumes of water lately. No seizure activity been reported since 09/07/2013. Patient no longer drinks alcohol.    SUBJECTIVE (INTERVAL HISTORY) Lee [name] is at the bedside.  Overall he feels Lee condition is unchanged. She still has intermittent confusion as well as short-term memory difficulties which have not improved. Lee long-term memory seems to be all right. He has not had any further seizure episodes. No new complaints today   OBJECTIVE Temp:  [98 F (36.7 C)-98.9 F (37.2 C)] 98 F (36.7 C) (09/06 0800) Pulse Rate:  [62-80] 80 (09/06 0800) Cardiac Rhythm:  [-]  Resp:  [18] 18 (09/06 0800) BP: (114-145)/(69-89) 135/77 mmHg (09/06 1016) SpO2:  [98 %-100 %] 98 % (09/06 0800) Weight:  [171 lb 15.3 oz (78 kg)] 171 lb 15.3 oz (78 kg) (09/06 0400)   Recent Labs Lab 09/11/13 1517 09/11/13 1548  GLUCAP 96 93    Recent Labs Lab 09/11/13 1600 09/12/13 0333  NA 129* 130*  K 4.2 4.0  CL 93* 94*  CO2 25 25  GLUCOSE 84 102*  BUN 10 9  CREATININE 0.73 0.73  CALCIUM 8.7 8.6    Recent Labs Lab  09/11/13 1600 09/12/13 0333  AST 20 18  ALT 21 19  ALKPHOS 103 101  BILITOT 0.3 0.3  PROT 6.7 6.5  ALBUMIN 3.4* 3.2*    Recent Labs Lab 09/11/13 1600 09/12/13 0333  WBC 10.9* 10.5  NEUTROABS  --  6.5  HGB 14.8 14.2  HCT 40.9 39.4  MCV 94.5 94.5  PLT 270 254    Recent Labs Lab 09/11/13 2234  TROPONINI <0.30   No results found for this basename: LABPROT, INR,  in the last 72 hours  Recent Labs  09/11/13 1759  COLORURINE AMBER*  LABSPEC 1.023  PHURINE 6.0  GLUCOSEU NEGATIVE  HGBUR NEGATIVE  BILIRUBINUR NEGATIVE  KETONESUR NEGATIVE  PROTEINUR NEGATIVE  UROBILINOGEN 1.0  NITRITE NEGATIVE  LEUKOCYTESUR NEGATIVE       Component Value Date/Time   CHOL 198 09/12/2013 0333   TRIG 72 09/12/2013 0333   HDL 44 09/12/2013 0333   CHOLHDL 4.5 09/12/2013 0333   VLDL 14 09/12/2013 0333   LDLCALC 140* 09/12/2013 0333   Lab Results  Component Value Date   HGBA1C 6.0* 09/12/2013      Component Value Date/Time   LABOPIA NONE DETECTED 09/11/2013 1759   COCAINSCRNUR NONE DETECTED 09/11/2013 1759   LABBENZ NONE DETECTED 09/11/2013 1759   AMPHETMU NONE DETECTED 09/11/2013 1759   THCU NONE DETECTED 09/11/2013 1759   LABBARB NONE DETECTED 09/11/2013 1759     Recent Labs Lab 09/11/13 1618  ETH <11  Ct Head Wo Contrast 09/11/2013    1. Age-related involutional change  2. Soft tissue prominence in the sella turcica. Mass is not excluded. Recommend MRI preferably with contrast.  3. Numerous rounded lytic lesions in the calvarium. Lytic metastases not excluded.      MRI /  MrA Head Wo Contrast 09/11/2013    1. Small, acute infarct in the left thalamus.  2. Old infarcts in the right thalamus and pons. 3. Mild-to-moderate chronic small vessel ischemic disease.  4. Abnormal appearance of the distal left vertebral artery, incompletely evaluated but compatible with either occlusion or high-grade stenosis in the neck.    EEG 09/12/2013 : left hemispheric cerebral dysfunction in the setting of a  more generalized nonspecific cerebral dysfunction (encephalopathy). There was no seizure or seizure predisposition recorded on this study.  Carbamezapine level : 14.3 ug /ml   PHYSICAL EXAM Frail middle-aged African American gentleman currently not in distress. Sitting up in bed.Awake alert. Afebrile. Head is nontraumatic. Neck is supple without bruit. Hearing is normal. Cardiac exam no murmur or gallop. Lungs are clear to auscultation. Distal pulses are well felt. Neurological Exam : Awake alert disoriented. Diminished attention, registration and recall. Poor short-term memory. Speech is fluent without aphasia. Extraocular movements are full range without nystagmus. Blinks to threat bilaterally. Fundi were not visualized. Face is symmetric without weakness. No upper or lower extremity drift. Diminished fine finger movements on the right. Orbits left-to-right upper extremity. Gait was not tested. ASSESSMENT/PLAN  Lee [name] is a 63 y.o. male with   presenting with recurrent seizures followed by confusion and  Memory difficulties  secondary to left medial thalamic infarct etiology likely small vessel disease. He did not not receive IV t-PA due to delay in arrival and failure to recognize due  to   onset with seizure. Imaging confirms a left medial thalamic infarct. CT head reportedly shows small lytic lesions in the skull felt to be metastasis but MRI scan seems fairly unremarkable making those lesions questionable Stroke work up underway.      On aspirin 81 mg prior to admission, now on Plavix 75 mg n  MRI  left medial thalamic infarct and old infarcts in right thalamus and pons MRA  Abnormal appearance of the distal left vertebral artery,  incompletely evaluated but compatible with either occlusion or  high-grade stenosis in the neck   Carotid Doppler  1-39% internal carotid artery stenosis bilaterally. The right vertebral artery is patent with antegrade flow. Unable to visualize  the left vertebral artery.    2D Echo  pending  LDL140mg  %, no statin prior to admission goal of LDL <100-will start Lipitor 20 mg  HgbA1c pending  Lovenox for VTE prophylaxis  Cardiac diet.     Up with assistance    Therapy needs:  Pending  Ongoing aggressive risk factor management  Risk factor education  Patient counseled to be compliant with Lee antithrombotic medications  Disposition:  Home likely  Hypertension   Home meds:Lisinopril 20 mg Resumed in hospital  BP 114-145/74-89 past 24h  SBP goal below 200/110   Stable   Patient counseled to be compliant with Lee blood pressure medications  Hyperlipidemia  Home meds:  None. Started Lipitor in hospital )  LDL 140  LDL goal < 100 (<70 for diabetics)    Other Stroke Risk Factors      Obesity, Body mass index is 24.67 kg/(m^2).     Hx strok     Family hx stroke in  Dad Peripheral arterial disease   Other Active Problems  Epilepsy with breakthru seizures- Continue  keppra in home dosage.  Mild carbamezapine toxicity- will decrease dose to 1000 mg daily  Mild Hyponatremia - likley carbamezapine effect  Other Pertinent History  DC Home after echo done likely Hospital day # 2  Delia Heady, MD  09/13/2013, 1:20 PM     To contact Stroke Continuity provider, please refer to [email or web address]. After hours, contact General Neurology

## 2013-09-13 NOTE — Progress Notes (Addendum)
PROGRESS NOTE  Lee [name] KZS:[id number] DOB: [birth year] DOA: 09/11/2013 PCP: Elyn Peers, MD  Assessment/Plan: Acute encephalopathy - Secondary to seizure/stroke  Acute CVA workup complete. Continue plavix. Patient with significant cognitive dysfunction, not present previously. ST and OT recommending CIR. Will consult them. Patient's wife works weekdays, and patient not safe to be alone.  Seizure disorder - patient's Keppra dose has been increased to 1000 mg twice daily. continue the same dose of Neurontin and carbamazepine.  Hyponatremia - patient wife states patient drinks a lot of water. Check urine studies for urine sodium and osmolality. Check TSH. Check next chest x-ray. Closely follow metabolic panel. Patient may need fluid restriction.  COPD - presently not wheezing. X ray ok  Hypertension - continue present medication.  Tobacco abuse - tobacco cessation counseling requested  Abnormal EKG with ST depression inferior leads and nonspecific ST-T changes in lateral leads. MI ruled out. Echo without WMA  Peripheral vascular disease status post bypass - no acute issues. On aspirin.  Abnormal lesions seen on CT head. Discussed with neuroradiologist, Dr. Jeannine Kitten. Could be arachnoid granulations v. Changes of multiple myeloma. Will get SPEP. If normal, no further workup.  Code Status: full Family Communication: brother and wife Disposition Plan: CIR?   Consultants:  neurology  Procedures:   HPI/Subjective: No complaints. Per family, periods of confusion  Objective: Filed Vitals:   09/13/13 1741  BP: 135/74  Pulse: 68  Temp: 97 F (36.1 C)  Resp: 18    Intake/Output Summary (Last 24 hours) at 09/13/13 2029 Last data filed at 09/13/13 1818  Gross per 24 hour  Intake    840 ml  Output      0 ml  Net    840 ml   Filed Weights   09/11/13 2342 09/12/13 0536 09/13/13 0400  Weight: 78.2 kg (172 lb 6.4 oz) 78.2 kg (172 lb 6.4 oz) 78 kg (171 lb 15.3 oz)     Exam:   General:  Alert, answers questions. Oriented to person and place, not time  Cardiovascular: rrr without MGR  Respiratory: clear without WRR  Abdomen: +BS, soft, NT  Musculoskeletal: no edema  Neuro: nonfocal  Data Reviewed: Basic Metabolic Panel:  Recent Labs Lab 09/11/13 1600 09/12/13 0333  NA 129* 130*  K 4.2 4.0  CL 93* 94*  CO2 25 25  GLUCOSE 84 102*  BUN 10 9  CREATININE 0.73 0.73  CALCIUM 8.7 8.6   Liver Function Tests:  Recent Labs Lab 09/11/13 1600 09/12/13 0333  AST 20 18  ALT 21 19  ALKPHOS 103 101  BILITOT 0.3 0.3  PROT 6.7 6.5  ALBUMIN 3.4* 3.2*   No results found for this basename: LIPASE, AMYLASE,  in the last 168 hours No results found for this basename: AMMONIA,  in the last 168 hours CBC:  Recent Labs Lab 09/11/13 1600 09/12/13 0333  WBC 10.9* 10.5  NEUTROABS  --  6.5  HGB 14.8 14.2  HCT 40.9 39.4  MCV 94.5 94.5  PLT 270 254   Cardiac Enzymes:  Recent Labs Lab 09/11/13 2234  TROPONINI <0.30   BNP (last 3 results) No results found for this basename: PROBNP,  in the last 8760 hours CBG:  Recent Labs Lab 09/11/13 1517 09/11/13 1548  GLUCAP 96 93    No results found for this or any previous visit (from the past 240 hour(s)).   Studies: Dg Chest 2 View  09/12/2013   CLINICAL DATA:  Stroke  EXAM: CHEST  2  VIEW  COMPARISON:  01/22/2012  FINDINGS: Lungs are essentially clear. No focal consolidation. No pleural effusion or pneumothorax.  The heart is normal in size.  Visualized osseous structures are within normal limits.  IMPRESSION: No active cardiopulmonary disease.   Electronically Signed   By: Julian Hy M.D.   On: 09/12/2013 08:26   Mr Jodene Nam Head Wo Contrast  09/13/2013   ADDENDUM REPORT: 09/13/2013 17:56  ADDENDUM: Subcentimeter lucencies in the skull at the vertex described on head CT are favored to represent arachnoid granulations, a common finding in this location. A 6 mm focus of abnormal marrow  signal in the left parietal bone slightly away from midline (series 7, image 24) is indeterminate. Correlation with laboratory testing may be of use if myeloma is a clinical concern.  The content of this addendum was discussed by telephone with Dr. Conley Canal on 09/13/2013 at 5:40 pm by Dr. Jeannine Kitten.   Electronically Signed   By: Logan Bores   On: 09/13/2013 17:56   09/13/2013   CLINICAL DATA:  Altered mental status.  EXAM: MRI HEAD WITHOUT CONTRAST  MRA HEAD WITHOUT CONTRAST  TECHNIQUE: Multiplanar, multiecho pulse sequences of the brain and surrounding structures were obtained without intravenous contrast. Angiographic images of the head were obtained using MRA technique without contrast.  COMPARISON:  Head CT 09/11/2013  FINDINGS: MRI HEAD FINDINGS  IV access could not be obtained despite multiple attempts, and therefore contrast could not be administered.  Pituitary is within normal limits for size without gross sellar or suprasellar mass on this nondedicated, noncontrast examination.  There is a 1 cm acute infarct in the anterior left thalamus. There is no intracranial hemorrhage, mass, midline shift, or extra-axial fluid collection. Moderate generalized cerebral atrophy is present. Patchy T2 hyperintensities in the subcortical and deep cerebral white matter bilaterally are nonspecific but compatible with mild-to-moderate chronic small vessel ischemic disease. An old infarct is noted in the pons. Old lacunar infarct is also present in the right thalamus.  Orbits are unremarkable. Paranasal sinuses and mastoid air cells are clear. Distal left vertebral artery flow void is abnormal and may be occluded in the neck. Other major intracranial vascular flow voids are preserved.  MRA HEAD FINDINGS  The visualized distal right vertebral artery is patent and dominant and supplies the basilar. There is flow related enhancement in the distal intracranial portion of the left vertebral artery, however the more proximal  intracranial left vertebral artery is incompletely imaged but appears irregular and small in caliber, possibly occluded or severely stenotic more proximally given appearance on T2 weighted brain images. PICA origins are patent bilaterally. AICA and SCA origins are patent. Basilar artery is patent without stenosis. PCAs are unremarkable aside from mild branch vessel irregularity.  Internal carotid arteries are patent from skullbase to carotid termini. Right MCA and both ACAs are unremarkable. Diminished flow related enhancement in the region of the left MCA bifurcation is favored to be artifactual. No intracranial aneurysm is identified.  IMPRESSION: 1. Small, acute infarct in the left thalamus. 2. Old infarcts in the right thalamus and pons. 3. Mild-to-moderate chronic small vessel ischemic disease. 4. Abnormal appearance of the distal left vertebral artery, incompletely evaluated but compatible with either occlusion or high-grade stenosis in the neck.  Electronically Signed: By: Logan Bores On: 09/11/2013 22:19   Mr Brain Wo Contrast  09/13/2013   ADDENDUM REPORT: 09/13/2013 17:56  ADDENDUM: Subcentimeter lucencies in the skull at the vertex described on head CT are favored to represent arachnoid  granulations, a common finding in this location. A 6 mm focus of abnormal marrow signal in the left parietal bone slightly away from midline (series 7, image 24) is indeterminate. Correlation with laboratory testing may be of use if myeloma is a clinical concern.  The content of this addendum was discussed by telephone with Dr. Lendell Caprice on 09/13/2013 at 5:40 pm by Dr. Constance Goltz.   Electronically Signed   By: Sebastian Ache   On: 09/13/2013 17:56   09/13/2013   CLINICAL DATA:  Altered mental status.  EXAM: MRI HEAD WITHOUT CONTRAST  MRA HEAD WITHOUT CONTRAST  TECHNIQUE: Multiplanar, multiecho pulse sequences of the brain and surrounding structures were obtained without intravenous contrast. Angiographic images of the head were  obtained using MRA technique without contrast.  COMPARISON:  Head CT 09/11/2013  FINDINGS: MRI HEAD FINDINGS  IV access could not be obtained despite multiple attempts, and therefore contrast could not be administered.  Pituitary is within normal limits for size without gross sellar or suprasellar mass on this nondedicated, noncontrast examination.  There is a 1 cm acute infarct in the anterior left thalamus. There is no intracranial hemorrhage, mass, midline shift, or extra-axial fluid collection. Moderate generalized cerebral atrophy is present. Patchy T2 hyperintensities in the subcortical and deep cerebral white matter bilaterally are nonspecific but compatible with mild-to-moderate chronic small vessel ischemic disease. An old infarct is noted in the pons. Old lacunar infarct is also present in the right thalamus.  Orbits are unremarkable. Paranasal sinuses and mastoid air cells are clear. Distal left vertebral artery flow void is abnormal and may be occluded in the neck. Other major intracranial vascular flow voids are preserved.  MRA HEAD FINDINGS  The visualized distal right vertebral artery is patent and dominant and supplies the basilar. There is flow related enhancement in the distal intracranial portion of the left vertebral artery, however the more proximal intracranial left vertebral artery is incompletely imaged but appears irregular and small in caliber, possibly occluded or severely stenotic more proximally given appearance on T2 weighted brain images. PICA origins are patent bilaterally. AICA and SCA origins are patent. Basilar artery is patent without stenosis. PCAs are unremarkable aside from mild branch vessel irregularity.  Internal carotid arteries are patent from skullbase to carotid termini. Right MCA and both ACAs are unremarkable. Diminished flow related enhancement in the region of the left MCA bifurcation is favored to be artifactual. No intracranial aneurysm is identified.  IMPRESSION:  1. Small, acute infarct in the left thalamus. 2. Old infarcts in the right thalamus and pons. 3. Mild-to-moderate chronic small vessel ischemic disease. 4. Abnormal appearance of the distal left vertebral artery, incompletely evaluated but compatible with either occlusion or high-grade stenosis in the neck.  Electronically Signed: By: Sebastian Ache On: 09/11/2013 22:19   Echo Left ventricle: The cavity size was normal. Wall thickness was normal. Systolic function was normal. The estimated ejection fraction was in the range of 55% to 60%. Wall motion was normal; there were no regional wall motion abnormalities. Left ventricular diastolic function parameters were normal. - Left atrium: The atrium was mildly dilated. - Right ventricle: The cavity size was mildly dilated. - Right atrium: The atrium was mildly dilated.  Impressions:  - Normal LV function; mild biatrial enlargement; mild RVE; no significant valvular regurgitation.  Scheduled Meds: . aspirin  300 mg Rectal Daily   Or  . aspirin  325 mg Oral Daily  . atorvastatin  20 mg Oral q1800  . carbamazepine  400  mg Oral TID  . clopidogrel  75 mg Oral Daily  . enoxaparin (LOVENOX) injection  40 mg Subcutaneous Daily  . gabapentin  600 mg Oral TID  . levETIRAcetam  1,000 mg Oral BID  . lisinopril  20 mg Oral Daily  . sodium chloride  3 mL Intravenous Q12H   Continuous Infusions:  Antibiotics Given (last 72 hours)   None     Time spent: 35 min  [facility] Pager [phone]. If 7PM-7AM, please contact night-coverage at [email or web address], password [email or web address] 09/13/2013, 8:29 PM  LOS: 2 days

## 2013-09-13 NOTE — Progress Notes (Signed)
  Echocardiogram 2D Echocardiogram has been performed.  Arvil Chaco 09/13/2013, 4:15 PM

## 2013-09-13 NOTE — Progress Notes (Signed)
Occupational Therapy Evaluation Patient Details Name: Lee [name] MRN: [id number] DOB: June 17, 1950 Today's Date: 09/13/2013    History of Present Illness Lee [name] is a 63 y.o. male with history of seizure, experienced 2 episodes of grand mal seizure on September 07, 2013. Since the seizure patient has been confused and has not come back to his baseline. CT head showed soft tissue prominence in the sella Turcica and lytic lesions. MRI brain shows acute left thalamic infarct.     Clinical Impression   PTA pt was independent with use of SPC for functional mobility and ADLs. Pt currently presenting with severely decreased memory, problem solving deficits, and perseveration. Pt appears to function at a higher level than actuality. Family cannot provide 24/7 care and this OT has concerns regarding safety if pt is left home alone, as outlined throughout this note. Pt was unable to locate his room despite verbal and visual cueing due to memory and problem solving deficits. He demonstrated unsafe use of DME when left in functional context. Pt was unable to carry out two-step command due to memory deficits. Feel that pt is a high fall risk due to cognitive deficits and would be an excellent candidate for CIR. *Please see comments under "Cognition" section. Pt is not currently oriented to the year, the day of the week, or the month.     Follow Up Recommendations  CIR;Supervision/Assistance - 24 hour    Equipment Recommendations  Other (comment) (TBD)    Recommendations for Other Services Rehab consult     Precautions / Restrictions Precautions Precautions: Fall Restrictions Weight Bearing Restrictions: No      Mobility Bed Mobility Overal bed mobility: Modified Independent                Transfers Overall transfer level: Needs assistance Equipment used: Rolling walker (2 wheeled);Straight cane Transfers: Sit to/from Stand Sit to Stand: Min guard         General  transfer comment: Pt with unsafe transfers.          ADL Overall ADL's : Needs assistance/impaired                                       General ADL Comments: Pt requires supervision/min guard for all ADLs. Pt presents with perseveration of tasks and difficulty with sequencing. His problem solving is also severely impaired. Wife reports that pt was attempting to thread the sheet through his keys despite her telling him that it was incorrect.      Vision    Pt wears glasses and reports no change from baseline. No apparent deficits but will monitor.                  Perception Perception Perception Tested?: No   Praxis Praxis Praxis tested?: Deficits Deficits: Perseveration;Organization Praxis-Other Comments: Pt has executive functioning deficits.    Pertinent Vitals/Pain Pain Assessment: No/denies pain     Hand Dominance Right   Extremity/Trunk Assessment Upper Extremity Assessment Upper Extremity Assessment: Overall WFL for tasks assessed   Lower Extremity Assessment Lower Extremity Assessment: Generalized weakness   Cervical / Trunk Assessment Cervical / Trunk Assessment: Normal   Communication Communication Communication: No difficulties   Cognition Arousal/Alertness: Awake/alert Behavior During Therapy: Flat affect Overall Cognitive Status: Impaired/Different from baseline Area of Impairment: Orientation;Attention;Memory;Following commands;Safety/judgement;Awareness;Problem solving Orientation Level: Disoriented to;Time;Situation Current Attention Level: Sustained Memory: Decreased short-term memory Following Commands:  Follows one step commands with increased time;Follows one step commands inconsistently Safety/Judgement: Decreased awareness of safety;Decreased awareness of deficits Awareness: Emergent Problem Solving: Slow processing;Difficulty sequencing;Requires verbal cues;Requires tactile cues General Comments: Pt appears to  function higher than actual. He answered questions to place meal appropriately. However, he answers other questions with confidence but brother and wife report that his answers are incorrect. Pt perseverating on the number 5 and his phone number from 10 years ago. Wife states that he has moments of clarity. Took pt out of room and asked him to note room number. he ambulated down the hallway and OT asked pt to lead her back to the room. Pt proceeded to get lost (with OT) unable to determine the correct room number (despite multiple attempts to cue both verbally and visually with room plaques). Pt required cueing of who he might ask for assistance to find his room. When pt arrived at nurses's station he had difficulty phrasing the question to ask how to find his room. RN prompted pt with room number and directions and pt again attempted to lead OT there but was unable to do so, despite passing his room 4 times and using verbal and visual cues. When standing outside room 19, I asked pt "Here is room 19. There is room 20. Your room is number 21. Show me your room" and pt was still unable to lead OT to the correct room. When pt opened the door to his room, I asked how he might tell that it's his room. Pt was unable to distinguish his (very distinctive patterned cane), or other personal belongings until he saw his wife in the corner.    General Comments    OT allowed pt to ambulate freely with RW to see how pt would use without cueing if left home alone in functional context. When moving around objects in the hallway, pt picked up RW and then walked several feet with it. At one point, OT asked pt to stop. Encouraged pt to ask a nurse at the nurses's station for directions (behind him). Pt turned around inside RW, grabbed the front bar with his hand behind him, and dragging the RW behind him as he walked. Feel that cognition will cause impaired use of RW and will increase fall risk at home. After returned to room,  attempted to orient pt to call bell for needs multiple times using different strategies. However, pt unable to be oriented to call bell. When asked (after re-orienting) "how do you call your nurse?" pt repeatedly answered with his home telephone number from 10 years ago (per wife).  Pt unable to recall wife's number.             Home Living Family/patient expects to be discharged to:: Private residence Living Arrangements: Spouse/significant other Available Help at Discharge: Family;Available PRN/intermittently (wife works (new job) and is gone during the day) Type of Home: [employer] Access: Stairs to enter Secretary/administrator of Steps: 3 Entrance Stairs-Rails: Right Home Layout: One level     Bathroom Shower/Tub: Chief Strategy Officer: Standard     Home Equipment: Medical laboratory scientific officer - single point      Lives With: Spouse    Prior Functioning/Environment Level of Independence: Independent with assistive device(s)        Comments: pt used SPC (named "Luther") for ambulation    OT Diagnosis: Cognitive deficits;Generalized weakness;Altered mental status   OT Problem List: Impaired balance (sitting and/or standing);Decreased cognition;Decreased safety awareness;Decreased knowledge of use  of DME or AE   OT Treatment/Interventions: Energy conservation;Self-care/ADL training;DME and/or AE instruction;Therapeutic activities;Cognitive remediation/compensation;Patient/family education;Balance training    OT Goals(Current goals can be found in the care plan section) Acute Rehab OT Goals Patient Stated Goal: to get in bed OT Goal Formulation: With patient/family Time For Goal Achievement: 09/27/13 Potential to Achieve Goals: Good ADL Goals Pt Will Perform Upper Body Dressing: Independently (with attention to task and correct sequencing.) Pt Will Perform Lower Body Dressing: Independently (with attention to task and correct sequencing.) Pt Will Transfer to Toilet:  Independently;ambulating (safely with DME used correctly.) Additional ADL Goal #1: Pt will attend to 3/3 tasks during session with decreased perseveration to complete ADLs. Additional ADL Goal #2: Pt will demonstrate problem solving during ADLs by identifying 3/3 safety risks and appropriate reponse.  OT Frequency: Min 3X/week   Barriers to D/C: Other (comment)  pt's wife works during the day and he does not have 24/7 care.       Co-evaluation              End of Session Equipment Utilized During Treatment: Gait belt;Rolling walker Nurse Communication: Other (comment) (outline of therapy session and cognition)  Activity Tolerance: Patient tolerated treatment well Patient left: in bed;with call bell/phone within reach;with family/visitor present;with bed alarm set;Other (comment) (several attempts to orient to call light without success.)   Time: 4098-1191 OT Time Calculation (min): 52 min Charges:  OT General Charges $OT Visit: 1 Procedure OT Evaluation $Initial OT Evaluation Tier I: 1 Procedure OT Treatments $Self Care/Home Management : 8-22 mins $Cognitive Skills Development: 23-37 mins  Rae Lips [phone] 09/13/2013, 7:54 PM

## 2013-09-14 DIAGNOSIS — I6330 Cerebral infarction due to thrombosis of unspecified cerebral artery: Principal | ICD-10-CM

## 2013-09-14 NOTE — Care Management Note (Addendum)
    Page 1 of 1   09/16/2013     11:28:05 AM CARE MANAGEMENT NOTE 09/16/2013  Patient:  Lee [name], Lee [name]   Account Number:  [id number]  Date Initiated:  09/14/2013  Documentation initiated by:  GRAVES-BIGELOW,Melynda Krzywicki  Subjective/Objective Assessment:   Pt admitted for Acute encephalopathy -  Secondary to seizure/stroke.     Action/Plan:   CIR to consult to see if eligible for CIR. CM will continue to monitor.   Anticipated DC Date:  09/15/2013   Anticipated DC Plan:  IP REHAB FACILITY      DC Planning Services  CM consult      Choice offered to / List presented to:             Status of service:  Completed, signed off Medicare Important Message given?  YES (If response is "NO", the following Medicare IM given date fields will be blank) Date Medicare IM given:  09/14/2013 Medicare IM given by:  GRAVES-BIGELOW,Rodrigus Kilker Date Additional Medicare IM given:   Additional Medicare IM given by:    Discharge Disposition:  SKILLED NURSING FACILITY  Per UR Regulation:  Reviewed for med. necessity/level of care/duration of stay  If discussed at Long Length of Stay Meetings, dates discussed:   09/17/2013    Comments:  09-16-13 [address] RN,BSN [phone] CSW assisting with SNF placement for today due to no beds available @ [facility]. No further needs from CM at this time.

## 2013-09-14 NOTE — Progress Notes (Signed)
STROKE TEAM PROGRESS NOTE   HISTORY Lee [name] is an 63 y.o. male with a history of stroke, seizure disorder, hypertension, peripheral vascular disease, COPD and alcohol abuse, presenting with altered mental status since 09/07/2013. Patient reportedly had 2 grand mal seizures on 09/07/2013 and has remained confused. He reportedly had missed doses of seizure medication and also has not been taking antihypertensive medication. He has been prescribed Tegretol 400 mg 3 times a day, Keppra 750 mg twice a day and Neurontin 600 mg 3 times a day. His Tegretol level in the emergency room was 9.3. CT scan of his head showed no acute intracranial abnormality. Soft tissue prominence in the sella turcica was noted as well as numerous round lytic lesions involving the calvarium. He was noted also to have hyponatremia with sodium of 129. His wife indicates that he has been drinking very large volumes of water lately. No seizure activity been reported since 09/07/2013. Patient no longer drinks alcohol.    SUBJECTIVE (INTERVAL HISTORY) His wife is at the bedside.  Overall he feels his condition is unchanged. She still has intermittent confusion as well as short-term memory difficulties which have not improved.   He has not had any further seizure episodes. No new complaints today   OBJECTIVE Temp:  [97 F (36.1 C)-98.4 F (36.9 C)] 98.4 F (36.9 C) (09/07 1400) Pulse Rate:  [60-75] 69 (09/07 1400) Cardiac Rhythm:  [-]  Resp:  [16-18] 18 (09/07 1400) BP: (127-145)/(66-77) 133/73 mmHg (09/07 1400) SpO2:  [97 %-100 %] 100 % (09/07 1400)   Recent Labs Lab 09/11/13 1517 09/11/13 1548  GLUCAP 96 93    Recent Labs Lab 09/11/13 1600 09/12/13 0333  NA 129* 130*  K 4.2 4.0  CL 93* 94*  CO2 25 25  GLUCOSE 84 102*  BUN 10 9  CREATININE 0.73 0.73  CALCIUM 8.7 8.6    Recent Labs Lab 09/11/13 1600 09/12/13 0333  AST 20 18  ALT 21 19  ALKPHOS 103 101  BILITOT 0.3 0.3  PROT 6.7 6.5   ALBUMIN 3.4* 3.2*    Recent Labs Lab 09/11/13 1600 09/12/13 0333  WBC 10.9* 10.5  NEUTROABS  --  6.5  HGB 14.8 14.2  HCT 40.9 39.4  MCV 94.5 94.5  PLT 270 254    Recent Labs Lab 09/11/13 2234  TROPONINI <0.30   No results found for this basename: LABPROT, INR,  in the last 72 hours  Recent Labs  09/11/13 1759  COLORURINE AMBER*  LABSPEC 1.023  PHURINE 6.0  GLUCOSEU NEGATIVE  HGBUR NEGATIVE  BILIRUBINUR NEGATIVE  KETONESUR NEGATIVE  PROTEINUR NEGATIVE  UROBILINOGEN 1.0  NITRITE NEGATIVE  LEUKOCYTESUR NEGATIVE       Component Value Date/Time   CHOL 198 09/12/2013 0333   TRIG 72 09/12/2013 0333   HDL 44 09/12/2013 0333   CHOLHDL 4.5 09/12/2013 0333   VLDL 14 09/12/2013 0333   LDLCALC 140* 09/12/2013 0333   Lab Results  Component Value Date   HGBA1C 6.0* 09/12/2013      Component Value Date/Time   LABOPIA NONE DETECTED 09/11/2013 1759   COCAINSCRNUR NONE DETECTED 09/11/2013 1759   LABBENZ NONE DETECTED 09/11/2013 1759   AMPHETMU NONE DETECTED 09/11/2013 1759   THCU NONE DETECTED 09/11/2013 1759   LABBARB NONE DETECTED 09/11/2013 1759     Recent Labs Lab 09/11/13 1618  ETH <11    Ct Head Wo Contrast 09/11/2013    1. Age-related involutional change  2. Soft tissue prominence in the  sella turcica. Mass is not excluded. Recommend MRI preferably with contrast.  3. Numerous rounded lytic lesions in the calvarium. Lytic metastases not excluded.      MRI /  MrA Head Wo Contrast 09/11/2013    1. Small, acute infarct in the left thalamus.  2. Old infarcts in the right thalamus and pons. 3. Mild-to-moderate chronic small vessel ischemic disease.  4. Abnormal appearance of the distal left vertebral artery, incompletely evaluated but compatible with either occlusion or high-grade stenosis in the neck.    EEG 09/12/2013 : left hemispheric cerebral dysfunction in the setting of a more generalized nonspecific cerebral dysfunction (encephalopathy). There was no seizure or seizure  predisposition recorded on this study.  Carbamezapine level : 14.3 ug /ml   PHYSICAL EXAM Frail middle-aged African American gentleman currently not in distress. Sitting up in bed.Awake alert. Afebrile. Head is nontraumatic. Neck is supple without bruit. Hearing is normal. Cardiac exam no murmur or gallop. Lungs are clear to auscultation. Distal pulses are well felt. Neurological Exam : Awake alert disoriented. Diminished attention, registration and recall. Poor short-term memory. Speech is fluent without aphasia. Extraocular movements are full range without nystagmus. Blinks to threat bilaterally. Fundi were not visualized. Face is symmetric without weakness. No upper or lower extremity drift. Diminished fine finger movements on the right. Orbits left-to-right upper extremity. Gait was not tested. ASSESSMENT/PLAN  [name] is a 63 y.o. male with   presenting with recurrent seizures followed by confusion and  Memory difficulties  secondary to left medial thalamic infarct etiology likely small vessel disease. He did not not receive IV t-PA due to delay in arrival and failure to recognize due  to   onset with seizure. Imaging confirms a left medial thalamic infarct. CT head reportedly shows small lytic lesions in the skull felt to be metastasis but MRI scan seems fairly unremarkable making those lesions questionable. These were reviewed with neuroradiology by Dr Lendell Caprice and they agree.        On aspirin 81 mg prior to admission, now on Plavix 75 mg n  MRI  left medial thalamic infarct and old infarcts in right thalamus and pons MRA  Abnormal appearance of the distal left vertebral artery,  incompletely evaluated but compatible with either occlusion or  high-grade stenosis in the neck   Carotid Doppler  1-39% internal carotid artery stenosis bilaterally. The right vertebral artery is patent with antegrade flow. Unable to visualize the left vertebral artery.   2D Echo Left  ventricle: The cavity size was normal. Wall thickness was normal. Systolic function was normal. The estimated ejection fraction was in the range of 55% to 60%. Wall motion was normal; there were no regional wall motion abnormalities    LDL140mg  %, no statin prior to admission goal of LDL <100-will start Lipitor 20 mg  HgbA1c pending  Lovenox for VTE prophylaxis  Cardiac diet.     Up with assistance    Therapy needs:  Pending  Ongoing aggressive risk factor management  Risk factor education  Patient counseled to be compliant with his antithrombotic medications  Disposition:  Home likely  Hypertension   Home meds:Lisinopril 20 mg Resumed in hospital  BP 114-145/74-89 past 24h  SBP goal below 200/110   Stable   Patient counseled to be compliant with his blood pressure medications  Hyperlipidemia  Home meds:  None. Started Lipitor in hospital )  LDL 140  LDL goal < 100 (<70 for diabetics)    Other Stroke Risk  Factors      Obesity, Body mass index is 24.67 kg/(m^2).     Hx strok     Family hx stroke in Dad Peripheral arterial disease   Other Active Problems  Epilepsy with breakthru seizures- Decrease  keppra to home dosage. As increase may be contributing to his confusion  Mild carbamezapine toxicity- will decrease dose to 1000 mg daily  Mild Hyponatremia - likley carbamezapine effect  Other Pertinent History  DC Home after echo done likely Hospital day # 3 Stroke team will sign off. Finally call for questions. Outpatient followup with Dr. Marjory Lies in 1 month Delia Heady, MD  09/14/2013, 2:32 PM     To contact Stroke Continuity provider, please refer to [email or web address]. After hours, contact General Neurology

## 2013-09-14 NOTE — Progress Notes (Signed)
Occupational Therapy Treatment Patient Details Name: Lee [name] MRN: [id number] DOB: 1950/11/22 Today's Date: 09/14/2013    History of present illness Lee [name] is a 63 y.o. male with history of seizure, experienced 2 episodes of grand mal seizure on September 07, 2013. Since the seizure patient has been confused and has not come back to his baseline. CT head showed soft tissue prominence in the sella Turcica and lytic lesions. MRI brain shows acute left thalamic infarct.     OT comments  This 63 yo male admitted with above and seen today to focus on toileting and attending to task looking at perservation presents to acute OT making progress with mobility for BADLs but still showing cognitive issues with these tasks as far as time spent, perseveration, and initiation; as well as personality change towards his wife. Pt would greatly benefit from inpatient rehab to focus on safety with BADLs and IADLs.  Follow Up Recommendations  CIR;Supervision/Assistance - 24 hour    Equipment Recommendations   (TBD at next venue)    Recommendations for Other Services Rehab consult    Precautions / Restrictions Precautions Precautions: Fall Restrictions Weight Bearing Restrictions: No       Mobility Bed Mobility Overal bed mobility: Needs Assistance Bed Mobility: Supine to Sit     Supine to sit: Supervision     General bed mobility comments: Due to he would keep stopping and I would have to cue him to keep scooting forward  Transfers Overall transfer level: Needs assistance   Transfers: Sit to/from Stand Sit to Stand: Supervision              Balance Overall balance assessment: Needs assistance Sitting-balance support: Feet supported;No upper extremity supported Sitting balance-Leahy Scale: Good     Standing balance support: No upper extremity supported Standing balance-Leahy Scale: Fair                     ADL Overall ADL's : Needs assistance/impaired      Grooming: Wash/dry Clinical cytogeneticist: Min guard;Ambulation;Regular Toilet;Grab bars   Toileting- Clothing Manipulation and Hygiene: Supervision/safety;Sit to/from stand                      Praxis Praxis Praxis tested?: Deficits Deficits: Perseveration Praxis-Other Comments: When asking him the date at the beginning of the session he kept perservating on the number 2 for every question asked; at the end of the session when asked the date again he said "9th", then after told it was the 7th he perseverated on this number for the other orientation questions.    Cognition   Behavior During Therapy: Flat affect Overall Cognitive Status: Impaired/Different from baseline Area of Impairment: Orientation Orientation Level: Time Current Attention Level: Sustained    Following Commands: Follows one step commands with increased time Safety/Judgement: Decreased awareness of safety   Problem Solving: Requires verbal cues;Slow processing General Comments: Pt's wife was trying to A him with getting his gowns off and a new one on, in a rather rude voice he told her to stop that she did not know what she was doing and just needed to sit down. When I asked him how long they had been married he said again in a rude tone, "too long".  I asked him when coming out of the bathroom to wash his hands. I showed him once  where the soap was and once where the paper towels were (since he was not moving on to the next step) wtih just that one verbal/gestural cue for each he completed the task--turning off the water without cueing.                 Pertinent Vitals/ Pain       Pain Assessment: No/denies pain         Frequency Min 3X/week     Progress Toward Goals  OT Goals(current goals can now be found in the care plan section)  Progress towards OT goals: Progressing toward goals     Plan Discharge plan remains appropriate        End of Session Equipment Utilized During Treatment: Rolling walker (but then transitioned to not use any AD)   Activity Tolerance Patient tolerated treatment well   Patient Left with family/visitor present (sitting EOB with nursing giving him his morning meds)   Nurse Communication  (does fine with RW in his room)        Time: 9562-1308 OT Time Calculation (min): 23 min  Charges: OT General Charges $OT Visit: 1 Procedure OT Treatments $Self Care/Home Management : 23-37 mins  Evette Georges [phone] 09/14/2013, 11:32 AM

## 2013-09-14 NOTE — Progress Notes (Signed)
PROGRESS NOTE  Lee [name] ZWC:[id number] DOB: February 21, 1950 DOA: 09/11/2013 PCP: Elyn Peers, MD  Assessment/Plan: Acute encephalopathy - Secondary to seizure/stroke  Acute CVA workup complete. Continue plavix. Patient with significant cognitive dysfunction, not present previously. ST and OT recommending CIR. Will consult them. Patient's wife works weekdays, and patient not safe to be alone.  Seizure disorder - neuro has adjusted AEDs  Hyponatremia recheck in am  COPD - presently not wheezing. X ray ok  Hypertension - continue present medication.  Tobacco abuse - tobacco cessation counseling requested  Abnormal EKG with ST depression inferior leads and nonspecific ST-T changes in lateral leads. MI ruled out. Echo without WMA  Peripheral vascular disease status post bypass - no acute issues. On aspirin.  Abnormal lesions seen on CT head. Discussed with neuroradiologist, Dr. Jeannine Kitten. Could be arachnoid granulations v. Changes of multiple myeloma. Will get SPEP. If normal, no further workup.  Code Status: full Family Communication: brother and wife Disposition Plan: CIR?   Consultants:  Neurology  PM&R  Procedures:   HPI/Subjective: No complaints  Objective: Filed Vitals:   09/14/13 1114  BP: 145/77  Pulse: 75  Temp:   Resp:     Intake/Output Summary (Last 24 hours) at 09/14/13 1236 Last data filed at 09/13/13 2256  Gross per 24 hour  Intake    303 ml  Output      0 ml  Net    303 ml   Filed Weights   09/11/13 2342 09/12/13 0536 09/13/13 0400  Weight: 78.2 kg (172 lb 6.4 oz) 78.2 kg (172 lb 6.4 oz) 78 kg (171 lb 15.3 oz)    Exam:   General:  Alert, answers questions. Oriented to person and place, not time  Cardiovascular: rrr without MGR  Respiratory: clear without WRR  Abdomen: +BS, soft, NT  Musculoskeletal: no edema  Neuro: nonfocal  Data Reviewed: Basic Metabolic Panel:  Recent Labs Lab 09/11/13 1600 09/12/13 0333  NA 129*  130*  K 4.2 4.0  CL 93* 94*  CO2 25 25  GLUCOSE 84 102*  BUN 10 9  CREATININE 0.73 0.73  CALCIUM 8.7 8.6   Liver Function Tests:  Recent Labs Lab 09/11/13 1600 09/12/13 0333  AST 20 18  ALT 21 19  ALKPHOS 103 101  BILITOT 0.3 0.3  PROT 6.7 6.5  ALBUMIN 3.4* 3.2*   No results found for this basename: LIPASE, AMYLASE,  in the last 168 hours No results found for this basename: AMMONIA,  in the last 168 hours CBC:  Recent Labs Lab 09/11/13 1600 09/12/13 0333  WBC 10.9* 10.5  NEUTROABS  --  6.5  HGB 14.8 14.2  HCT 40.9 39.4  MCV 94.5 94.5  PLT 270 254   Cardiac Enzymes:  Recent Labs Lab 09/11/13 2234  TROPONINI <0.30   BNP (last 3 results) No results found for this basename: PROBNP,  in the last 8760 hours CBG:  Recent Labs Lab 09/11/13 1517 09/11/13 1548  GLUCAP 96 93    No results found for this or any previous visit (from the past 240 hour(s)).   Studies: No results found. Echo Left ventricle: The cavity size was normal. Wall thickness was normal. Systolic function was normal. The estimated ejection fraction was in the range of 55% to 60%. Wall motion was normal; there were no regional wall motion abnormalities. Left ventricular diastolic function parameters were normal. - Left atrium: The atrium was mildly dilated. - Right ventricle: The cavity size was mildly dilated. - Right  atrium: The atrium was mildly dilated.  Impressions:  - Normal LV function; mild biatrial enlargement; mild RVE; no significant valvular regurgitation.  Scheduled Meds: . aspirin  300 mg Rectal Daily   Or  . aspirin  325 mg Oral Daily  . atorvastatin  20 mg Oral q1800  . carbamazepine  400 mg Oral TID  . clopidogrel  75 mg Oral Daily  . enoxaparin (LOVENOX) injection  40 mg Subcutaneous Daily  . gabapentin  600 mg Oral TID  . levETIRAcetam  1,000 mg Oral BID  . lisinopril  20 mg Oral Daily  . sodium chloride  3 mL Intravenous Q12H   Continuous Infusions:   Antibiotics Given (last 72 hours)   None     Time spent: 15 min  [facility] Pager [phone]. If 7PM-7AM, please contact night-coverage at [email or web address], password [facility] 09/14/2013, 12:36 PM  LOS: 3 days

## 2013-09-14 NOTE — Progress Notes (Signed)
Rehab Admissions Coordinator Note:  Patient was screened by Paislie Tessler L for appropriateness for an Inpatient Acute Rehab Consult.  At this time, we are recommending Inpatient Rehab consult.  Alexzavier Girardin L 09/14/2013, 9:20 AM  I can be reached at [phone].

## 2013-09-14 NOTE — Progress Notes (Signed)
Physical Therapy Treatment Patient Details Name: Lee [name] MRN: [id number] DOB: [birth year] Today's Date: 09/14/2013    History of Present Illness Lee [name] is a 63 y.o. male with history of seizure, experienced 2 episodes of grand mal seizure on September 07, 2013. Since the seizure patient has been confused and has not come back to his baseline. CT head showed soft tissue prominence in the sella Turcica and lytic lesions. MRI brain shows acute left thalamic infarct.      PT Comments    Patient progressing well with mobility and balance however continues to exhibit cognitive deficits affecting safe mobility. Pt with impaired executive functioning noted throughout treatment. Requires constant cues for re-direction, attention to task, memory and initiation. Discharge recommendations updated due to above deficits and impaired cognition affecting safe mobility. Will continue to follow and progress as tolerated.   Follow Up Recommendations  CIR;Supervision/Assistance - 24 hour     Equipment Recommendations  None recommended by PT    Recommendations for Other Services       Precautions / Restrictions Precautions Precautions: Fall Restrictions Weight Bearing Restrictions: No    Mobility  Bed Mobility Overal bed mobility: Needs Assistance Bed Mobility: Supine to Sit     Supine to sit: Supervision     General bed mobility comments: Required cues for technique to initiate transfer otherwise laying in supine. HOB flat, no rails.  Transfers Overall transfer level: Needs assistance Equipment used: Straight cane Transfers: Sit to/from Stand Sit to Stand: Supervision         General transfer comment: Supervision for safety upon standing.  Ambulation/Gait Ambulation/Gait assistance: Min assist;Min guard Ambulation Distance (Feet): 300 Feet (+ 300' with 1 seated rest break.) Assistive device: Straight cane Gait Pattern/deviations: Step-through pattern;Decreased  stride length;Drifts right/left Gait velocity: decreased   General Gait Details: Pt carrying cane for part of ambulation bout instead if using it. VC to properly use cane. Performed cognitive training during gait for pt to find certain room numbers, read room map and try to follow to go in correct direction. Pt not able to problem solve finding correct room despite multiple attempts to cue both verbally and visually with room plaques. Min A due to unsteadiness with head movements.    Stairs Stairs: Yes Stairs assistance: Min guard Stair Management: One rail Right Number of Stairs: 12 General stair comments: Required VC for technique and seqencing as pt carrying cane up steps as an accessory vs functional AD.   Wheelchair Mobility    Modified Rankin (Stroke Patients Only) Modified Rankin (Stroke Patients Only) Pre-Morbid Rankin Score: Slight disability Modified Rankin: Moderate disability     Balance Overall balance assessment: Needs assistance Sitting-balance support: Feet supported;No upper extremity supported Sitting balance-Leahy Scale: Good Sitting balance - Comments: Can donn shoes sitting EOB, reaching outside BoS without LOB or difficulty.    Standing balance support: During functional activity Standing balance-Leahy Scale: Fair Standing balance comment: Able to perform static standing for short periods without UE support however requires UE support during dynamic standing/gait for safety due to mild balance deficits.                     Cognition Arousal/Alertness: Awake/alert Behavior During Therapy: WFL for tasks assessed/performed Overall Cognitive Status: Impaired/Different from baseline Area of Impairment: Orientation Orientation Level: Disoriented to;Time Current Attention Level: Sustained Memory: Decreased short-term memory Following Commands: Follows one step commands with increased time Safety/Judgement: Decreased awareness of safety;Decreased  awareness of deficits  Problem Solving: Requires verbal cues;Slow processing;Decreased initiation General Comments: Provided instructions on 2 activities to perform upon sitting EOB. Pt continuing to lay in supine after being told to sit up. Required cues for sequencing and technique (bring LEs to EOB) before completing task to get to EOB. Not able to recall 2 activities to perform when sitting up (find shoes and put on gown).      Exercises      General Comments        Pertinent Vitals/Pain Pain Assessment: No/denies pain    Home Living                      Prior Function            PT Goals (current goals can now be found in the care plan section) Progress towards PT goals: Progressing toward goals    Frequency       PT Plan Discharge plan needs to be updated    Co-evaluation             End of Session Equipment Utilized During Treatment: Gait belt Activity Tolerance: Patient tolerated treatment well Patient left: in bed;with call bell/phone within reach;with bed alarm set;with family/visitor present     Time: 1131-1204 PT Time Calculation (min): 33 min  Charges:  $Gait Training: 23-37 mins                    G CodesAlvie Heidelberg A Sep 20, 2013, 1:56 PM Alvie Heidelberg, PT, DPT [phone]

## 2013-09-15 DIAGNOSIS — I6330 Cerebral infarction due to thrombosis of unspecified cerebral artery: Secondary | ICD-10-CM

## 2013-09-15 LAB — BASIC METABOLIC PANEL
ANION GAP: 11 (ref 5–15)
BUN: 8 mg/dL (ref 6–23)
CALCIUM: 8.6 mg/dL (ref 8.4–10.5)
CHLORIDE: 92 meq/L — AB (ref 96–112)
CO2: 25 mEq/L (ref 19–32)
Creatinine, Ser: 0.61 mg/dL (ref 0.50–1.35)
GFR calc Af Amer: >90 mL/min (ref >90)
GFR calc non Af Amer: >90 mL/min (ref >90)
Glucose, Bld: 86 mg/dL (ref 70–99)
POTASSIUM: 4 meq/L (ref 3.7–5.3)
Sodium: 128 mEq/L — ABNORMAL LOW (ref 137–147)

## 2013-09-15 LAB — CBC
HEMATOCRIT: 39.9 % (ref 39.0–52.0)
Hemoglobin: 14.5 g/dL (ref 13.0–17.0)
MCH: 34 pg (ref 26.0–34.0)
MCHC: 36.3 g/dL — ABNORMAL HIGH (ref 30.0–36.0)
MCV: 93.4 fL (ref 78.0–100.0)
Platelets: 273 10*3/uL (ref 150–400)
RBC: 4.27 MIL/uL (ref 4.22–5.81)
RDW: 13.5 % (ref 11.5–15.5)
WBC: 8.2 10*3/uL (ref 4.0–10.5)

## 2013-09-15 LAB — TSH: TSH: 0.719 u[IU]/mL (ref 0.350–4.500)
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
## 2013-09-15 NOTE — Progress Notes (Signed)
Speech Language Pathology Treatment: Cognitive-Linquistic  Patient Details Name: Lee [name] MRN: [id number] DOB: 1950/12/07 Today's Date: 09/15/2013 Time: 4098-1191 SLP Time Calculation (min): 24 min  Assessment / Plan / Recommendation Clinical Impression  Pt was seen for cognitive-linguistic treatment. He is limited in his ability to verbally communicate due to perseveration. With Max multimodal cueing provided by SLP due to perseveration, patient was able to read aloud single words. Pt was then presented with binary, written choices for orientation questions, which he answered accurately for name, location, situation, month of birth, and current month. Pt required Mod cueing from SLP to follow one-step commands, and Max-Total A for following two-step commands. SLP educated patient and spouse regarding current level of function and recommendations for continued therapy.   HPI HPI: Pt is a 63 y.o. male with PMH of seizures- found confused after 2 episodes of grand mal seizures on 8/31. Admitted 9/3. MRI 9/4 showed acute L thalamic infarct with old infarcts in thalamus and pons; pt currently with acute encephalopathy. SLP eval ordered to evaluate speech/language/cognition post-stroke.   Pertinent Vitals Pain Assessment: No/denies pain  SLP Plan  Continue with current plan of care    Recommendations                Follow up Recommendations: Inpatient Rehab;24 hour supervision/assistance Plan: Continue with current plan of care    GO      Maxcine Ham, M.A. CCC-SLP [phone]  Maxcine Ham 09/15/2013, 4:32 PM

## 2013-09-15 NOTE — Progress Notes (Signed)
Physical Therapy Treatment Patient Details Name: BERLE FITZ MRN: [id number] DOB: 02/10/1950 Today's Date: 09/15/2013    History of Present Illness Lee [name] is a 63 y.o. male with history of seizure, experienced 2 episodes of grand mal seizure on September 07, 2013. Since the seizure patient has been confused and has not come back to his baseline. CT head showed soft tissue prominence in the sella [place]cica and lytic lesions. MRI brain shows acute left thalamic infarct.      PT Comments    Pt very pleasant but continues to demonstrate significant cognitive and balance deficits. Pt lacks awareness, carryover and STM impairing his ability to be alone. Wife works and discussed with wife and brother end of session need to consider the options for 24hr supervision for pt safety. Pt educated for date, situation, deficits, plan and need for continued therapy. Will continue to follow and recommend CIR.   Follow Up Recommendations  CIR;Supervision/Assistance - 24 hour     Equipment Recommendations       Recommendations for Other Services       Precautions / Restrictions Precautions Precautions: Fall Restrictions Weight Bearing Restrictions: No    Mobility  Bed Mobility               General bed mobility comments: EOB on arrival  Transfers Overall transfer level: Needs assistance Equipment used: None Transfers: Sit to/from Stand Sit to Stand: Supervision Stand pivot transfers: Supervision       General transfer comment: pt required use of hands despite cues to attempt without  Ambulation/Gait Ambulation/Gait assistance: Min guard Ambulation Distance (Feet): 400 Feet Assistive device: None Gait Pattern/deviations: Step-through pattern;Decreased stride length   Gait velocity interpretation: Below normal speed for age/gender General Gait Details: Did not attempt DME use today as pt has not been successful previously. Pt able to maintain balance with gait  although catching Right foot on floor several times during gait without LOB. Pt uanble to complete significant head turns with gait despite verbal cues and demonstration. Cues and assist for direction, pt not recalling room number or using directional signs with cues   Stairs            Wheelchair Mobility    Modified Rankin (Stroke Patients Only) Modified Rankin (Stroke Patients Only) Pre-Morbid Rankin Score: No symptoms Modified Rankin: Moderately severe disability     Balance Overall balance assessment: Needs assistance   Sitting balance-Leahy Scale: Good       Standing balance-Leahy Scale: Fair                      Cognition Arousal/Alertness: Awake/alert Behavior During Therapy: WFL for tasks assessed/performed Overall Cognitive Status: Impaired/Different from baseline Area of Impairment: Orientation;Memory;Following commands;Safety/judgement;Problem solving Orientation Level: Disoriented to;Time;Place Current Attention Level: Sustained Memory: Decreased short-term memory Following Commands: Follows one step commands consistently Safety/Judgement: Decreased awareness of safety;Decreased awareness of deficits Awareness: Emergent Problem Solving: Requires verbal cues;Slow processing;Decreased initiation;Difficulty sequencing General Comments: Pt reading hospital education on arrival, unable to tell me what it was referring to. Pt unable to problem solve where he was even though stating "hospital gown" and "IV", unable to use directional signs with gait. Difficulty with completing tasks such as washing his hands without step by step cueing    Exercises      General Comments        Pertinent Vitals/Pain Pain Assessment: No/denies pain    Home Living  Prior Function            PT Goals (current goals can now be found in the care plan section) Progress towards PT goals: Progressing toward goals    Frequency        PT Plan Current plan remains appropriate    Co-evaluation             End of Session Equipment Utilized During Treatment: Gait belt Activity Tolerance: Patient tolerated treatment well Patient left: in chair;with call bell/phone within reach;with family/visitor present     Time: 4696-2952 PT Time Calculation (min): 38 min  Charges:  $Gait Training: 8-22 mins $Therapeutic Activity: 8-22 mins $Physical Performance Test: 8-22 mins                    G Codes:      Delorse Lek Sep 22, 2013, 11:55 AM Delaney Meigs, PT [phone]

## 2013-09-15 NOTE — Progress Notes (Signed)
PROGRESS NOTE  Lee [name] KZL:[id number] DOB: 1950/06/24 DOA: 09/11/2013 PCP: Elyn Peers, MD  Assessment/Plan: Acute encephalopathy - Secondary to seizure/stroke  Acute CVA workup complete. Continue plavix. Patient with significant cognitive dysfunction, not present previously. ST and OT recommending CIR. Awaiting word from rehab. Patient's wife works weekdays, and patient not safe to be alone.  Seizure disorder - neuro has adjusted AEDs  Hyponatremia about the same. TSH normal  COPD - presently not wheezing. X ray ok  Hypertension - continue present medication.  Tobacco abuse - tobacco cessation counseling requested  Abnormal EKG with ST depression inferior leads and nonspecific ST-T changes in lateral leads. MI ruled out. Echo without WMA  Peripheral vascular disease status post bypass - no acute issues. On aspirin.  Abnormal lesions seen on CT head. Discussed with neuroradiologist, Dr. Jeannine Kitten. Could be arachnoid granulations v. Changes of multiple myeloma. SPEP pending. If normal, no further workup.  Code Status: full Family Communication: brother and wife Disposition Plan: CIR?   Consultants:  Neurology  PM&R  Procedures:   HPI/Subjective: No complaints  Objective: Filed Vitals:   09/15/13 1455  BP: 127/66  Pulse: 67  Temp: 98.2 F (36.8 C)  Resp: 18    Intake/Output Summary (Last 24 hours) at 09/15/13 1532 Last data filed at 09/14/13 1800  Gross per 24 hour  Intake    240 ml  Output      0 ml  Net    240 ml   Filed Weights   09/12/13 0536 09/13/13 0400 09/15/13 0533  Weight: 78.2 kg (172 lb 6.4 oz) 78 kg (171 lb 15.3 oz) 78.7 kg (173 lb 8 oz)    Exam:   General:  Alert, answers questions. Oriented to person and place, not time  Cardiovascular: rrr without MGR  Respiratory: clear without WRR  Abdomen: +BS, soft, NT  Musculoskeletal: no edema  Neuro: nonfocal  Data Reviewed: Basic Metabolic Panel:  Recent Labs Lab  09/11/13 1600 09/12/13 0333 09/15/13 0640  NA 129* 130* 128*  K 4.2 4.0 4.0  CL 93* 94* 92*  CO2 $Re'25 25 25  'vZk$ GLUCOSE 84 102* 86  BUN $Re'10 9 8  'xGJ$ CREATININE 0.73 0.73 0.61  CALCIUM 8.7 8.6 8.6   Liver Function Tests:  Recent Labs Lab 09/11/13 1600 09/12/13 0333  AST 20 18  ALT 21 19  ALKPHOS 103 101  BILITOT 0.3 0.3  PROT 6.7 6.5  ALBUMIN 3.4* 3.2*   No results found for this basename: LIPASE, AMYLASE,  in the last 168 hours No results found for this basename: AMMONIA,  in the last 168 hours CBC:  Recent Labs Lab 09/11/13 1600 09/12/13 0333 09/15/13 0640  WBC 10.9* 10.5 8.2  NEUTROABS  --  6.5  --   HGB 14.8 14.2 14.5  HCT 40.9 39.4 39.9  MCV 94.5 94.5 93.4  PLT 270 254 273   Cardiac Enzymes:  Recent Labs Lab 09/11/13 2234  TROPONINI <0.30   BNP (last 3 results) No results found for this basename: PROBNP,  in the last 8760 hours CBG:  Recent Labs Lab 09/11/13 1517 09/11/13 1548  GLUCAP 96 93    No results found for this or any previous visit (from the past 240 hour(s)).   Studies: No results found. Echo Left ventricle: The cavity size was normal. Wall thickness was normal. Systolic function was normal. The estimated ejection fraction was in the range of 55% to 60%. Wall motion was normal; there were no regional wall motion abnormalities. Left  ventricular diastolic function parameters were normal. - Left atrium: The atrium was mildly dilated. - Right ventricle: The cavity size was mildly dilated. - Right atrium: The atrium was mildly dilated.  Impressions:  - Normal LV function; mild biatrial enlargement; mild RVE; no significant valvular regurgitation.  Scheduled Meds: . aspirin  300 mg Rectal Daily   Or  . aspirin  325 mg Oral Daily  . atorvastatin  20 mg Oral q1800  . carbamazepine  400 mg Oral TID  . clopidogrel  75 mg Oral Daily  . enoxaparin (LOVENOX) injection  40 mg Subcutaneous Daily  . gabapentin  600 mg Oral TID  .  levETIRAcetam  1,000 mg Oral BID  . lisinopril  20 mg Oral Daily  . sodium chloride  3 mL Intravenous Q12H   Continuous Infusions:  Antibiotics Given (last 72 hours)   None     Time spent: 15 min  [facility] Pager [phone]. If 7PM-7AM, please contact night-coverage at [email or web address], password [email or web address] 09/15/2013, 3:32 PM  LOS: 4 days

## 2013-09-16 LAB — PROTEIN ELECTROPHORESIS, SERUM
ALBUMIN ELP: 54.4 % — AB (ref 55.8–66.1)
ALPHA-1-GLOBULIN: 5.3 % — AB (ref 2.9–4.9)
Alpha-2-Globulin: 14.7 % — ABNORMAL HIGH (ref 7.1–11.8)
Beta 2: 7 % — ABNORMAL HIGH (ref 3.2–6.5)
Beta Globulin: 5.4 % (ref 4.7–7.2)
Gamma Globulin: 13.2 % (ref 11.1–18.8)
M-Spike, %: NOT DETECTED g/dL
TOTAL PROTEIN ELP: 7.1 g/dL (ref 6.0–8.3)

## 2013-09-16 MED ORDER — CARBAMAZEPINE 200 MG PO TABS
400.0000 mg | ORAL_TABLET | Freq: Two times a day (BID) | ORAL | Status: DC
Start: 2013-09-16 — End: 2013-09-16

## 2013-09-16 MED ORDER — CARBAMAZEPINE 200 MG PO TABS: 400.0000 mg | ORAL_TABLET | Freq: Two times a day (BID) | ORAL | Status: DC

## 2013-09-16 NOTE — Discharge Instructions (Signed)
Cardiac Diet This diet can help prevent heart disease and stroke. Many factors influence your heart health, including eating and exercise habits. Coronary risk rises a lot with abnormal blood fat (lipid) levels. Cardiac meal planning includes limiting unhealthy fats, increasing healthy fats, and making other small dietary changes. General guidelines are as follows:  Adjust calorie intake to reach and maintain desirable body weight.  Limit total fat intake to less than 30% of total calories. Saturated fat should be less than 7% of calories.  Saturated fats are found in animal products and in some vegetable products. Saturated vegetable fats are found in coconut oil, cocoa butter, palm oil, and palm kernel oil. Read labels carefully to avoid these products as much as possible. Use butter in moderation. Choose tub margarines and oils that have 2 grams of fat or less. Good cooking oils are canola and olive oils.  Practice low-fat cooking techniques. Do not fry food. Instead, broil, bake, boil, steam, grill, roast on a rack, stir-fry, or microwave it. Other fat reducing suggestions include:  Remove the skin from poultry.  Remove all visible fat from meats.  Skim the fat off stews, soups, and gravies before serving them.  Steam vegetables in water or broth instead of sauting them in fat.  Avoid foods with trans fat (or hydrogenated oils), such as commercially fried foods and commercially baked goods. Commercial shortening and deep-frying fats will contain trans fat.  Increase intake of fruits, vegetables, whole grains, and legumes to replace foods high in fat.  Increase consumption of nuts, legumes, and seeds to at least 4 servings weekly. One serving of a legume equals  cup, and 1 serving of nuts or seeds equals  cup.  Choose whole grains more often. Have 3 servings per day (a serving is 1 ounce [oz]).  Eat 4 to 5 servings of vegetables per day. A serving of vegetables is 1 cup of raw leafy  vegetables;  cup of raw or cooked cut-up vegetables;  cup of vegetable juice.  Eat 4 to 5 servings of fruit per day. A serving of fruit is 1 medium whole fruit;  cup of dried fruit;  cup of fresh, frozen, or canned fruit;  cup of 100% fruit juice.  Increase your intake of dietary fiber to 20 to 30 grams per day. Insoluble fiber may help lower your risk of heart disease and may help curb your appetite.  Soluble fiber binds cholesterol to be removed from the blood. Foods high in soluble fiber are dried beans, citrus fruits, oats, apples, bananas, broccoli, Brussels sprouts, and eggplant.  Try to include foods fortified with plant sterols or stanols, such as yogurt, breads, juices, or margarines. Choose several fortified foods to achieve a daily intake of 2 to 3 grams of plant sterols or stanols.  Foods with omega-3 fats can help reduce your risk of heart disease. Aim to have a 3.5 oz portion of fatty fish twice per week, such as salmon, mackerel, albacore tuna, sardines, lake trout, or herring. If you wish to take a fish oil supplement, choose one that contains 1 gram of both DHA and EPA.  Limit processed meats to 2 servings (3 oz portion) weekly.  Limit the sodium in your diet to 1500 milligrams (mg) per day. If you have high blood pressure, talk to a registered dietitian about a DASH (Dietary Approaches to Stop Hypertension) eating plan.  Limit sweets and beverages with added sugar, such as soda, to no more than 5 servings per week. One  serving is:   1 tablespoon sugar.  1 tablespoon jelly or jam.   cup sorbet.  1 cup lemonade.   cup regular soda. CHOOSING FOODS Starches  Allowed: Breads: All kinds (wheat, rye, raisin, white, oatmeal, [place], [place], and English muffin bread). Low-fat rolls: English muffins, frankfurter and hamburger buns, bagels, pita bread, tortillas (not fried). Pancakes, waffles, biscuits, and muffins made with recommended oil.  Avoid: Products made with  saturated or trans fats, oils, or whole milk products. Butter rolls, cheese breads, croissants. Commercial doughnuts, muffins, sweet rolls, biscuits, waffles, pancakes, store-bought mixes. Crackers  Allowed: Low-fat crackers and snacks: Animal, graham, rye, saltine (with recommended oil, no lard), oyster, and matzo crackers. Bread sticks, melba toast, rusks, flatbread, pretzels, and light popcorn.  Avoid: High-fat crackers: cheese crackers, butter crackers, and those made with coconut, palm oil, or trans fat (hydrogenated oils). Buttered popcorn. Cereals  Allowed: Hot or cold whole-grain cereals.  Avoid: Cereals containing coconut, hydrogenated vegetable fat, or animal fat. Potatoes / Pasta / Rice  Allowed: All kinds of potatoes, rice, and pasta (such as macaroni, spaghetti, and noodles).  Avoid: Pasta or rice prepared with cream sauce or high-fat cheese. Chow mein noodles, [place] fries. Vegetables  Allowed: All vegetables and vegetable juices.  Avoid: Fried vegetables. Vegetables in cream, butter, or high-fat cheese sauces. Limit coconut. Fruit in cream or custard. Protein  Allowed: Limit your intake of meat, seafood, and poultry to no more than 6 oz (cooked weight) per day. All lean, well-trimmed beef, veal, pork, and lamb. All chicken and [place] without skin. All fish and shellfish. Wild game: wild duck, rabbit, pheasant, and venison. Egg whites or low-cholesterol egg substitutes may be used as desired. Meatless dishes: recipes with dried beans, peas, lentils, and tofu (soybean curd). Seeds and nuts: all seeds and most nuts.  Avoid: Prime grade and other heavily marbled and fatty meats, such as short ribs, spare ribs, rib eye roast or steak, frankfurters, sausage, bacon, and high-fat luncheon meats, mutton. Caviar. Commercially fried fish. Domestic duck, goose, venison sausage. Organ meats: liver, gizzard, heart, chitterlings, brains, kidney, sweetbreads. Dairy  Allowed: Low-fat  cheeses: nonfat or low-fat cottage cheese (1% or 2% fat), cheeses made with part skim milk, such as mozzarella, farmers, string, or ricotta. (Cheeses should be labeled no more than 2 to 6 grams fat per oz.). Skim (or 1%) milk: liquid, powdered, or evaporated. Buttermilk made with low-fat milk. Drinks made with skim or low-fat milk or cocoa. Chocolate milk or cocoa made with skim or low-fat (1%) milk. Nonfat or low-fat yogurt.  Avoid: Whole milk cheeses, including colby, cheddar, [place], Monterey Jack, [place], [place], [place], American, Swiss, and blue. Creamed cottage cheese, cream cheese. Whole milk and whole milk products, including buttermilk or yogurt made from whole milk, drinks made from whole milk. Condensed milk, evaporated whole milk, and 2% milk. Soups and Combination Foods  Allowed: Low-fat low-sodium soups: broth, dehydrated soups, homemade broth, soups with the fat removed, homemade cream soups made with skim or low-fat milk. Low-fat spaghetti, lasagna, chili, and Spanish rice if low-fat ingredients and low-fat cooking techniques are used.  Avoid: Cream soups made with whole milk, cream, or high-fat cheese. All other soups. Desserts and Sweets  Allowed: Sherbet, fruit ices, gelatins, meringues, and angel food cake. Homemade desserts with recommended fats, oils, and milk products. Jam, jelly, honey, marmalade, sugars, and syrups. Pure sugar candy, such as gum drops, hard candy, jelly beans, marshmallows, mints, and small amounts of dark chocolate.  Avoid: Commercially prepared  cakes, pies, cookies, frosting, pudding, or mixes for these products. Desserts containing whole milk products, chocolate, coconut, lard, palm oil, or palm kernel oil. Ice cream or ice cream drinks. Candy that contains chocolate, coconut, butter, hydrogenated fat, or unknown ingredients. Buttered syrups. Fats and Oils  Allowed: Vegetable oils: safflower, sunflower, corn, soybean, cottonseed, sesame, canola, olive,  or peanut. Non-hydrogenated margarines. Salad dressing or mayonnaise: homemade or commercial, made with a recommended oil. Low or nonfat salad dressing or mayonnaise.  Limit added fats and oils to 6 to 8 tsp per day (includes fats used in cooking, baking, salads, and spreads on bread). Remember to count the "hidden fats" in foods.  Avoid: Solid fats and shortenings: butter, lard, salt pork, bacon drippings. Gravy containing meat fat, shortening, or suet. Cocoa butter, coconut. Coconut oil, palm oil, palm kernel oil, or hydrogenated oils: these ingredients are often used in bakery products, nondairy creamers, whipped toppings, candy, and commercially fried foods. Read labels carefully. Salad dressings made of unknown oils, sour cream, or cheese, such as blue cheese and Roquefort. Cream, all kinds: half-and-half, light, heavy, or whipping. Sour cream or cream cheese (even if "light" or low-fat). Nondairy cream substitutes: coffee creamers and sour cream substitutes made with palm, palm kernel, hydrogenated oils, or coconut oil. Beverages  Allowed: Coffee (regular or decaffeinated), tea. Diet carbonated beverages, mineral water. Alcohol: Check with your caregiver. Moderation is recommended.  Avoid: Whole milk, regular sodas, and juice drinks with added sugar. Condiments  Allowed: All seasonings and condiments. Cocoa powder. "Cream" sauces made with recommended ingredients.  Avoid: Carob powder made with hydrogenated fats. SAMPLE MENU Breakfast   cup orange juice   cup oatmeal  1 slice toast  1 tsp margarine  1 cup skim milk Lunch  [place] sandwich with 2 oz [place], 2 slices bread  Lettuce and tomato slices  Fresh fruit  Carrot sticks  Coffee or tea Snack  Fresh fruit or low-fat crackers Dinner  3 oz lean ground beef  1 baked potato  1 tsp margarine   cup asparagus  Lettuce salad  1 tbs non-creamy dressing   cup peach slices  1 cup skim milk Document Released:  10/04/2007 Document Revised: 06/26/2011 Document Reviewed: 02/24/2013 [employer] Patient Information 2015 [place], [place]. This information is not intended to replace advice given to you by your health care provider. Make sure you discuss any questions you have with your health care provider.  STROKE/TIA DISCHARGE INSTRUCTIONS SMOKING Cigarette smoking nearly doubles your risk of having a stroke & is the single most alterable risk factor  If you smoke or have smoked in the last 12 months, you are advised to quit smoking for your health.  Most of the excess cardiovascular risk related to smoking disappears within a year of stopping.  Ask you doctor about anti-smoking medications  [place] Quit Line: 1-800-QUIT NOW  Free Smoking Cessation Classes [phone]  CHOLESTEROL Know your levels; limit fat & cholesterol in your diet  Lipid Panel     Component Value Date/Time   CHOL 198 09/12/2013 0333   TRIG 72 09/12/2013 0333   HDL 44 09/12/2013 0333   CHOLHDL 4.5 09/12/2013 0333   VLDL 14 09/12/2013 0333   LDLCALC 140* 09/12/2013 0333      Many patients benefit from treatment even if their cholesterol is at goal.  Goal: Total Cholesterol (CHOL) less than 160  Goal:  Triglycerides (TRIG) less than 150  Goal:  HDL greater than 40  Goal:  LDL (LDLCALC) less than  100   BLOOD PRESSURE American Stroke Association blood pressure target is less that 120/80 mm/Hg  Your discharge blood pressure is:  BP: 128/66 mmHg  Monitor your blood pressure  Limit your salt and alcohol intake  Many individuals will require more than one medication for high blood pressure  DIABETES (A1c is a blood sugar average for last 3 months) Goal HGBA1c is under 7% (HBGA1c is blood sugar average for last 3 months)  Diabetes: No known diagnosis of diabetes    Lab Results  Component Value Date   HGBA1C 6.0* 09/12/2013     Your HGBA1c can be lowered with medications, healthy diet, and exercise.  Check your blood sugar as directed  by your physician  Call your physician if you experience unexplained or low blood sugars.  PHYSICAL ACTIVITY/REHABILITATION Goal is 30 minutes at least 4 days per week  Activity: Increase activity slowly, Therapies: REHAB Return to work:   Activity decreases your risk of heart attack and stroke and makes your heart stronger.  It helps control your weight and blood pressure; helps you relax and can improve your mood.  Participate in a regular exercise program.  Talk with your doctor about the best form of exercise for you (dancing, walking, swimming, cycling).  DIET/WEIGHT Goal is to maintain a healthy weight  Your discharge diet is: Cardiac REGULAR liquids Your height is:  Height:  (177.8 cm) Your current weight is: Weight: 78.7 kg (173 lb 8 oz) Your Body Mass Index (BMI) is:  BMI (Calculated): 24.8  Following the type of diet specifically designed for you will help prevent another stroke.  Your goal weight range is:  133 -167  Your goal Body Mass Index (BMI) is 19-24.  Healthy food habits can help reduce 3 risk factors for stroke:  High cholesterol, hypertension, and excess weight.  RESOURCES Stroke/Support Group:  Call [phone]   STROKE EDUCATION PROVIDED/REVIEWED AND GIVEN TO PATIENT Stroke warning signs and symptoms How to activate emergency medical system (call 911). Medications prescribed at discharge. Need for follow-up after discharge. Personal risk factors for stroke. Pneumonia vaccine given: No Flu vaccine given: No My questions have been answered, the writing is legible, and I understand these instructions.  I will adhere to these goals & educational materials that have been provided to me after my discharge from the hospital.   Stroke Prevention Some health problems and behaviors may make it more likely for you to have a stroke. Below are ways to lessen your risk of having a stroke.   Be active for at least 30 minutes on most or all days.  Do not smoke.  Try not to be around others who smoke.  Do not drink too much alcohol.  Do not have more than 2 drinks a day if you are a man.  Do not have more than 1 drink a day if you are a woman and are not pregnant.  Eat healthy foods, such as fruits and vegetables. If you were put on a specific diet, follow the diet as told.  Keep your cholesterol levels under control through diet and medicines. Look for foods that are low in saturated fat, trans fat, cholesterol, and are high in fiber.  If you have diabetes, follow all diet plans and take your medicine as told.  If you have high blood pressure (hypertension), follow all diet plans and take your medicine as told.  Keep a healthy weight. Eat foods that are low in calories, salt, saturated fat, trans  fat, and cholesterol.  Do not take drugs.  Avoid birth control pills, if this applies. Talk to your doctor about the risks of taking birth control pills.  Talk to your doctor if you have sleep problems (sleep apnea).  Take all medicine as told by your doctor.  You may be told to take aspirin or blood thinner medicine. Take this medicine as told by your doctor.  Understand your medicine instructions.  Make sure any other conditions you have are being taken care of. GET HELP RIGHT AWAY IF:  You suddenly lose feeling (you feel numb) or have weakness in your face, arm, or leg.  Your face or eyelid hangs down to one side.  You suddenly feel confused.  You have trouble talking (aphasia) or understanding what people are saying.  You suddenly have trouble seeing in one or both eyes.  You suddenly have trouble walking.  You are dizzy.  You lose your balance or your movements are clumsy (uncoordinated).  You suddenly have a very bad headache and you do not know the cause.  You have new chest pain.  Your heart feels like it is fluttering or skipping a beat (irregular heartbeat). Do not wait to see if the symptoms above go away. Get help  right away. Call your local emergency services (911 in [place] Do not drive yourself to the hospital. Document Released: 06/26/2011 Document Revised: 05/11/2013 Document Reviewed: 06/27/2012 [facility] Patient Information 2015 [place], [place]. This information is not intended to replace advice given to you by your health care provider. Make sure you discuss any questions you have with your health care provider.

## 2013-09-16 NOTE — Progress Notes (Signed)
CSW (Clinical Social Worker) left voicemail for pt wife asking for return phone call to discuss SNF and potential dc.  Kyon Bentler, LCSWA [phone]

## 2013-09-16 NOTE — Discharge Summary (Addendum)
Physician Discharge Summary  Lee WIEDERHOLT ZOX:[id number] DOB: 1950-09-30 DOA: 09/11/2013  PCP: [name], MD  Admit date: 09/11/2013 Discharge date: 09/16/2013  Time spent: greater than 30 minutes  Recommendations for Outpatient Follow-up:  1. Monitor LFTs on statin 2. To SNF 3. Follow up SPEP 4. Continue PT, OT, ST  Discharge Diagnoses:  Principal Problem:   Encephalopathy acute Active Problems:   HYPERTENSION   Seizure disorder   Acute ischemic infarct   Hyponatremia HTN, benign Mild carbamazepime toxicity  Discharge Condition: stable  Filed Weights   09/12/13 0536 09/13/13 0400 09/15/13 0533  Weight: 78.2 kg (172 lb 6.4 oz) 78 kg (171 lb 15.3 oz) 78.7 kg (173 lb 8 oz)    History of present illness:  63 y.o. male with history of seizure was brought to the ER by patient's wife after patient was found to be persistently confused after having experienced 2 episodes of grand mal seizure on September 07, 2013. Patient's wife states that patient had a generalized tonic-clonic seizure at 8 AM on September 07, 2013. At that time patient wife realize that patient had not taken his seizure medication the previous night. Patient was given all his antiseizure medications immediately. Following which he had another seizure one hour later which lasted for a few minutes and was generalized tonic-clonic. Since the seizure patient has been confused and has not come back to his baseline so patient was brought to the ER. In the ER carbamazepine levels were normal. CT head showed soft tissue prominence in the sella Turcica and lytic lesions. MRI brain shows acute left thalamic infarct. Patient is admitted for further management. EKG shows ST depression inferior leads patient does not have any chest pain or shortness of breath. Patient otherwise did not have any nausea vomiting abdominal pain diarrhea fever chills or headache or any visual symptoms. On exam patient is able to move all extremities.  Neurologist on-call has already seen the patient.  Hospital Course:  Admitted to hospitalists, neurology consulted. MRI showed acute infarct left thalamus. Aspirin changed to plavix 75 mg daily. LDL 140, so statin started. Echo showed no source of embolus. Carotid dopplers showed no significant stenosis. hgb A1C 6.0. Patient has no focal defecits, but significant cognitive difficulties which are reportedly new. PT, OT, ST all recommending inpatient rehab, but there are no beds, so will go to SNF.  No further seizures. EEG shows encephalopathy without epileptiform activity.  Carbamazepine level slightly elevated at 14, so changed from 400 tid to bid.  Seizures at home felt likely due to acute stroke.  Also has mild hyponatremia which may be carbamazepine effect.  Procedures:  none  Consultations:  neurology  Discharge Exam: Filed Vitals:   09/16/13 0947  BP: 137/68  Pulse: 64  Temp: 97.9 F (36.6 C)  Resp:     General: asleep. arousable Cardiovascular: RRR Respiratory: CTA Neuro: nonfocal   Discharge Instructions   Diet - low sodium heart healthy    Complete by:  As directed      Increase activity slowly    Complete by:  As directed           Current Discharge Medication List    START taking these medications   Details  atorvastatin (LIPITOR) 20 MG tablet Take 1 tablet (20 mg total) by mouth daily at 6 PM. Qty: 30 tablet, Refills: 1    clopidogrel (PLAVIX) 75 MG tablet Take 1 tablet (75 mg total) by mouth daily. Qty: 30 tablet, Refills: 0  CONTINUE these medications which have CHANGED   Details  carbamazepine (TEGRETOL) 200 MG tablet Take 2 tablets (400 mg total) by mouth 2 (two) times daily.       CONTINUE these medications which have NOT CHANGED   Details  gabapentin (NEURONTIN) 600 MG tablet Take 1 tablet (600 mg total) by mouth 3 (three) times daily. Qty: 270 tablet, Refills: 4    Iron-Vitamins (GERITOL COMPLETE PO) Take 1 tablet by mouth daily.     levETIRAcetam (KEPPRA) 750 MG tablet Take 1 tablet (750 mg total) by mouth 2 (two) times daily. Qty: 180 tablet, Refills: 4    lisinopril (PRINIVIL,ZESTRIL) 20 MG tablet Take 20 mg by mouth daily.     vitamin E 400 UNIT capsule Take 400 Units by mouth daily.      STOP taking these medications     aspirin EC 81 MG tablet        Allergies  Allergen Reactions  . Orange Juice [Orange Oil] Diarrhea  . Penicillins Nausea And Vomiting    Tolerates Zosyn. "might have been an overdose" (01/22/2012)  . Vicodin [Hydrocodone-Acetaminophen] Other (See Comments)    "triggered seizure both here and at home when he tried to take it" (01/22/2012)   Follow-up Information   Follow up with [facility], MD In 1 month.   Specialties:  Neurology, Radiology   Contact information:   [address] [place] [address] [phone]        The results of significant diagnostics from this hospitalization (including imaging, microbiology, ancillary and laboratory) are listed below for reference.    Significant Diagnostic Studies: Dg Chest 2 View  09/12/2013   CLINICAL DATA:  Stroke  EXAM: CHEST  2 VIEW  COMPARISON:  01/22/2012  FINDINGS: Lungs are essentially clear. No focal consolidation. No pleural effusion or pneumothorax.  The heart is normal in size.  Visualized osseous structures are within normal limits.  IMPRESSION: No active cardiopulmonary disease.   Electronically Signed   By: Charline Bills M.D.   On: 09/12/2013 08:26   Ct Head Wo Contrast  09/11/2013   CLINICAL DATA:  Altered mental status, disoriented  EXAM: CT HEAD WITHOUT CONTRAST  TECHNIQUE: Contiguous axial images were obtained from the base of the skull through the vertex without intravenous contrast.  COMPARISON:  None.  FINDINGS: Moderate diffuse atrophy. No hemorrhage or extra-axial fluid. No evidence of vascular territory infarct. Mild low attenuation in the deep periventricular white matter consistent with  chronic involutional change. Prominent ovoid soft tissue in the sella turcica measuring 16 x 7 mm. Numerous lytic areas throughout the calvarium, all measuring less than or equal to 8 mm. No significant inflammatory change in the visualized portions of the sinuses.  IMPRESSION: 1. Age-related involutional change 2. Soft tissue prominence in the sella turcica. Mass is not excluded. Recommend MRI preferably with contrast. 3. Numerous rounded lytic lesions in the calvarium. Lytic metastases not excluded.   Electronically Signed   By: Esperanza Heir M.D.   On: 09/11/2013 17:38   Mr Shirlee Latch QV Contrast  09/13/2013   ADDENDUM REPORT: 09/13/2013 17:56  ADDENDUM: Subcentimeter lucencies in the skull at the vertex described on head CT are favored to represent arachnoid granulations, a common finding in this location. A 6 mm focus of abnormal marrow signal in the left parietal bone slightly away from midline (series 7, image 24) is indeterminate. Correlation with laboratory testing may be of use if myeloma is a clinical concern.  The content of  this addendum was discussed by telephone with Dr. Lendell Caprice on 09/13/2013 at 5:40 pm by Dr. Constance Goltz.   Electronically Signed   By: Sebastian Ache   On: 09/13/2013 17:56   09/13/2013   CLINICAL DATA:  Altered mental status.  EXAM: MRI HEAD WITHOUT CONTRAST  MRA HEAD WITHOUT CONTRAST  TECHNIQUE: Multiplanar, multiecho pulse sequences of the brain and surrounding structures were obtained without intravenous contrast. Angiographic images of the head were obtained using MRA technique without contrast.  COMPARISON:  Head CT 09/11/2013  FINDINGS: MRI HEAD FINDINGS  IV access could not be obtained despite multiple attempts, and therefore contrast could not be administered.  Pituitary is within normal limits for size without gross sellar or suprasellar mass on this nondedicated, noncontrast examination.  There is a 1 cm acute infarct in the anterior left thalamus. There is no intracranial  hemorrhage, mass, midline shift, or extra-axial fluid collection. Moderate generalized cerebral atrophy is present. Patchy T2 hyperintensities in the subcortical and deep cerebral white matter bilaterally are nonspecific but compatible with mild-to-moderate chronic small vessel ischemic disease. An old infarct is noted in the pons. Old lacunar infarct is also present in the right thalamus.  Orbits are unremarkable. Paranasal sinuses and mastoid air cells are clear. Distal left vertebral artery flow void is abnormal and may be occluded in the neck. Other major intracranial vascular flow voids are preserved.  MRA HEAD FINDINGS  The visualized distal right vertebral artery is patent and dominant and supplies the basilar. There is flow related enhancement in the distal intracranial portion of the left vertebral artery, however the more proximal intracranial left vertebral artery is incompletely imaged but appears irregular and small in caliber, possibly occluded or severely stenotic more proximally given appearance on T2 weighted brain images. PICA origins are patent bilaterally. AICA and SCA origins are patent. Basilar artery is patent without stenosis. PCAs are unremarkable aside from mild branch vessel irregularity.  Internal carotid arteries are patent from skullbase to carotid termini. Right MCA and both ACAs are unremarkable. Diminished flow related enhancement in the region of the left MCA bifurcation is favored to be artifactual. No intracranial aneurysm is identified.  IMPRESSION: 1. Small, acute infarct in the left thalamus. 2. Old infarcts in the right thalamus and pons. 3. Mild-to-moderate chronic small vessel ischemic disease. 4. Abnormal appearance of the distal left vertebral artery, incompletely evaluated but compatible with either occlusion or high-grade stenosis in the neck.  Electronically Signed: By: Sebastian Ache On: 09/11/2013 22:19   Mr Brain Wo Contrast  09/13/2013   ADDENDUM REPORT: 09/13/2013  17:56  ADDENDUM: Subcentimeter lucencies in the skull at the vertex described on head CT are favored to represent arachnoid granulations, a common finding in this location. A 6 mm focus of abnormal marrow signal in the left parietal bone slightly away from midline (series 7, image 24) is indeterminate. Correlation with laboratory testing may be of use if myeloma is a clinical concern.  The content of this addendum was discussed by telephone with Dr. Lendell Caprice on 09/13/2013 at 5:40 pm by Dr. Constance Goltz.   Electronically Signed   By: Sebastian Ache   On: 09/13/2013 17:56   09/13/2013   CLINICAL DATA:  Altered mental status.  EXAM: MRI HEAD WITHOUT CONTRAST  MRA HEAD WITHOUT CONTRAST  TECHNIQUE: Multiplanar, multiecho pulse sequences of the brain and surrounding structures were obtained without intravenous contrast. Angiographic images of the head were obtained using MRA technique without contrast.  COMPARISON:  Head CT 09/11/2013  FINDINGS: MRI HEAD FINDINGS  IV access could not be obtained despite multiple attempts, and therefore contrast could not be administered.  Pituitary is within normal limits for size without gross sellar or suprasellar mass on this nondedicated, noncontrast examination.  There is a 1 cm acute infarct in the anterior left thalamus. There is no intracranial hemorrhage, mass, midline shift, or extra-axial fluid collection. Moderate generalized cerebral atrophy is present. Patchy T2 hyperintensities in the subcortical and deep cerebral white matter bilaterally are nonspecific but compatible with mild-to-moderate chronic small vessel ischemic disease. An old infarct is noted in the pons. Old lacunar infarct is also present in the right thalamus.  Orbits are unremarkable. Paranasal sinuses and mastoid air cells are clear. Distal left vertebral artery flow void is abnormal and may be occluded in the neck. Other major intracranial vascular flow voids are preserved.  MRA HEAD FINDINGS  The visualized distal  right vertebral artery is patent and dominant and supplies the basilar. There is flow related enhancement in the distal intracranial portion of the left vertebral artery, however the more proximal intracranial left vertebral artery is incompletely imaged but appears irregular and small in caliber, possibly occluded or severely stenotic more proximally given appearance on T2 weighted brain images. PICA origins are patent bilaterally. AICA and SCA origins are patent. Basilar artery is patent without stenosis. PCAs are unremarkable aside from mild branch vessel irregularity.  Internal carotid arteries are patent from skullbase to carotid termini. Right MCA and both ACAs are unremarkable. Diminished flow related enhancement in the region of the left MCA bifurcation is favored to be artifactual. No intracranial aneurysm is identified.  IMPRESSION: 1. Small, acute infarct in the left thalamus. 2. Old infarcts in the right thalamus and pons. 3. Mild-to-moderate chronic small vessel ischemic disease. 4. Abnormal appearance of the distal left vertebral artery, incompletely evaluated but compatible with either occlusion or high-grade stenosis in the neck.  Electronically Signed: By: Sebastian Ache On: 09/11/2013 22:19   Carotid doppler Findings suggest 1-39% internal carotid artery stenosis bilaterally. The right vertebral artery is patent with antegrade flow. Unable to visualize the left vertebral artery.  Echo  - Left ventricle: The cavity size was normal. Wall thickness was normal. Systolic function was normal. The estimated ejection fraction was in the range of 55% to 60%. Wall motion was normal; there were no regional wall motion abnormalities. Left ventricular diastolic function parameters were normal. - Left atrium: The atrium was mildly dilated. - Right ventricle: The cavity size was mildly dilated. - Right atrium: The atrium was mildly dilated.  Impressions:  - Normal LV function; mild biatrial  enlargement; mild RVE; no significant valvular regurgitation.  EKG Normal sinus rhythm Left axis deviation Nonspecific ST abnormality  EEG Clinical Interpretation: This EEG is consistent with a left hemispheric cerebral dysfunction in the setting of a more generalized nonspecific cerebral dysfunction (encephalopathy). There was no seizure or seizure predisposition recorded on this study.   Microbiology: No results found for this or any previous visit (from the past 240 hour(s)).   Labs: Basic Metabolic Panel:  Recent Labs Lab 09/11/13 1600 09/12/13 0333 09/15/13 0640  NA 129* 130* 128*  K 4.2 4.0 4.0  CL 93* 94* 92*  CO2 25 25 25   GLUCOSE 84 102* 86  BUN 10 9 8   CREATININE 0.73 0.73 0.61  CALCIUM 8.7 8.6 8.6   Liver Function Tests:  Recent Labs Lab 09/11/13 1600 09/12/13 0333  AST 20 18  ALT 21 19  ALKPHOS  103 101  BILITOT 0.3 0.3  PROT 6.7 6.5  ALBUMIN 3.4* 3.2*   No results found for this basename: LIPASE, AMYLASE,  in the last 168 hours No results found for this basename: AMMONIA,  in the last 168 hours CBC:  Recent Labs Lab 09/11/13 1600 09/12/13 0333 09/15/13 0640  WBC 10.9* 10.5 8.2  NEUTROABS  --  6.5  --   HGB 14.8 14.2 14.5  HCT 40.9 39.4 39.9  MCV 94.5 94.5 93.4  PLT 270 254 273   Cardiac Enzymes:  Recent Labs Lab 09/11/13 2234  TROPONINI <0.30   BNP: BNP (last 3 results) No results found for this basename: PROBNP,  in the last 8760 hours CBG:  Recent Labs Lab 09/11/13 1517 09/11/13 1548  GLUCAP 96 93   Signed:  Emree Locicero L  [facility] 09/16/2013, 11:28 AM

## 2013-09-16 NOTE — Progress Notes (Signed)
CSW Proofreader) spoke with pt wife and provided bed offers. With the help of additional family members present at bedside, pt wife chose to accept bed offer at [facility]. CSW notified facility of acceptance of bed and that pt is ready to dc. CSW prepared pt dc packet and provided to pt wife. Pt wife is wanting to transport pt and has no concerns. CSW notified pt nurse and facility. At this time, pt has no further hospital social work needs. CSW signing off.  Mutasim Tuckey, LCSWA [phone]

## 2013-09-16 NOTE — Progress Notes (Signed)
Visited pt in response to phone message left by medical staff. Upon visiting, pt and family said they did not require a chaplain visit. I informed them that chaplains are available should the need arise.   09/16/13 1400  Clinical Encounter Type  Visited With Patient;Family  Visit Type Initial  Referral From Nurse  Consult/Referral To Chaplain   Wille Glaser 09/16/2013 2:08 PM

## 2013-09-16 NOTE — Progress Notes (Signed)
Clinical Social Work Department BRIEF PSYCHOSOCIAL ASSESSMENT 09/16/2013  Patient:  Lee [name], Lee [name]     Account Number:  [id number]     Admit date:  09/11/2013  Clinical Social Worker:  Harless Nakayama  Date/Time:  09/16/2013 11:15 AM  Referred by:  Physician  Date Referred:  09/16/2013 Referred for  SNF Placement   Other Referral:   Interview type:  Family Other interview type:   Spoke with pt wife over the phone    PSYCHOSOCIAL DATA Living Status:  WIFE Admitted from facility:   Level of care:   Primary support name:  Margrett Rud Primary support relationship to patient:  SPOUSE Degree of support available:   Pt has good family support    CURRENT CONCERNS Current Concerns  Post-Acute Placement   Other Concerns:    SOCIAL WORK ASSESSMENT / PLAN CSW made aware that pt will be ready for dc today but no CIR bed available. CSW called pt wife to inform and discuss alternatives. CSW informed pt wife of SNF and home health options. Pt wife informed CSW if pt can be close to home she feels rehab at a facility will be good for him. Pt wife did ask to speak with CSW further about SNF in person before making a final decision. CSW explained referral process and asked if pt wife was agreeable to CSW sending out referral to all [facility] and bringing bed offers to meeting. Pt wife was agreeable to this.   Assessment/plan status:  Psychosocial Support/Ongoing Assessment of Needs Other assessment/ plan:   Information/referral to community resources:   SNF list to be provided with bed offers    PATIENT'S/FAMILY'S RESPONSE TO PLAN OF CARE: Pt wife undecided on dc plan but is agreeable to referral being sent to SNFs.       [name], LCSWA [phone]

## 2013-09-16 NOTE — Progress Notes (Addendum)
Clinical Social Work Department CLINICAL SOCIAL WORK PLACEMENT NOTE 09/16/2013  Patient:  Lee [name], Lee [name]  Account Number:  [id number] Admit date:  09/11/2013  Clinical Social Worker:  Harless Nakayama  Date/time:  09/16/2013 11:40 AM  Clinical Social Work is seeking post-discharge placement for this patient at the following level of care:   SKILLED NURSING   (*CSW will update this form in Epic as items are completed)   09/16/2013  Patient/family provided with [facility] Department of Clinical Social Work's list of facilities offering this level of care within the geographic area requested by the patient (or if unable, by the patient's family).  09/16/2013  Patient/family informed of their freedom to choose among providers that offer the needed level of care, that participate in Medicare, Medicaid or managed care program needed by the patient, have an available bed and are willing to accept the patient.  09/16/2013  Patient/family informed of [facility]' ownership interest in [facility], as well as of the fact that they are under no obligation to receive care at this facility.  PASARR submitted to EDS on 09/16/2013 PASARR number received on 09/16/2013  FL2 transmitted to all facilities in geographic area requested by pt/family on  09/16/2013 FL2 transmitted to all facilities within larger geographic area on   Patient informed that his/her managed care company has contracts with or will negotiate with  certain facilities, including the following:     Patient/family informed of bed offers received:  09/16/2013 Patient chooses bed at [facility] Physician recommends and patient chooses bed at    Patient to be transferred to [facility] on  09/16/2013 Patient to be transferred to facility by Family  Patient and family notified of transfer on 09/16/2013 Name of family member notified:  Margrett Rud (Wife)  The following physician request were  entered in Epic: Physician Request  Please sign FL2.    Additional CommentsSharol Harness, LCSWA [phone]

## 2013-09-16 NOTE — Progress Notes (Signed)
Note pt. Is for DC to [facility] today.  Will sign off.  Please call if questions.  Weldon Picking PT Inpatient Rehab Admissions Coordinator Cell [phone] Office [phone]

## 2013-09-16 NOTE — Progress Notes (Signed)
Inpatient Rehabilitation  I met with Mr. Fussell and spoke with his wife over the phone regarding pt's post acute rehab options.  I then spoke with Jacqlyn Krauss CM and Poonum Ambelal  SW.  Wife prefers IP rehab but I made it clear to her that no rehab beds are available today.  I am glad to proceed with insurance authorization process however Poonum has indicated that she believes pt is ready for DC.  I will hold off on submitting for insurance auth.  Please notify me if DC is not planned for today.  Please call if questions.  [place] Admissions Coordinator Cell [phone] Office [phone]

## 2013-09-18 ENCOUNTER — Encounter: Payer: Self-pay | Admitting: Adult Health

## 2013-09-18 ENCOUNTER — Non-Acute Institutional Stay (SKILLED_NURSING_FACILITY): Payer: Medicare Other | Admitting: Adult Health

## 2013-09-18 DIAGNOSIS — E785 Hyperlipidemia, unspecified: Secondary | ICD-10-CM

## 2013-09-18 DIAGNOSIS — I639 Cerebral infarction, unspecified: Secondary | ICD-10-CM

## 2013-09-18 DIAGNOSIS — I6350 Cerebral infarction due to unspecified occlusion or stenosis of unspecified cerebral artery: Secondary | ICD-10-CM

## 2013-09-18 DIAGNOSIS — I10 Essential (primary) hypertension: Secondary | ICD-10-CM

## 2013-09-18 DIAGNOSIS — G40909 Epilepsy, unspecified, not intractable, without status epilepticus: Secondary | ICD-10-CM

## 2013-09-18 LAB — PROTEIN ELECTROPHORESIS, SERUM
ALPHA-2-GLOBULIN: 13.3 % — AB (ref 7.1–11.8)
Albumin ELP: 55.1 % — ABNORMAL LOW (ref 55.8–66.1)
Alpha-1-Globulin: 5.5 % — ABNORMAL HIGH (ref 2.9–4.9)
Beta 2: 6.8 % — ABNORMAL HIGH (ref 3.2–6.5)
Beta Globulin: 6.3 % (ref 4.7–7.2)
GAMMA GLOBULIN: 13 % (ref 11.1–18.8)
M-Spike, %: NOT DETECTED g/dL
TOTAL PROTEIN ELP: 6.1 g/dL (ref 6.0–8.3)

## 2013-09-18 NOTE — Progress Notes (Signed)
Patient ID: Lee [name], male   DOB: February 03, 1950, 63 y.o.   MRN: [id number]               PROGRESS NOTE  DATE: 09/18/2013  FACILITY: Nursing Home Location: [facility]  LEVEL OF CARE: SNF (31)  Acute Visit  CHIEF COMPLAINT:  Follow-up Hospitalization  HISTORY OF PRESENT ILLNESS: This is a 63 year old male who has been admitted to [facility] on 09/17/13 from [facility] with principal discharge diagnosis of Encephalopathy. He has been admitted for a short-term rehabilitation.  REASSESSMENT OF ONGOING PROBLEM(S):  HTN: Pt 's HTN remains stable.  Denies CP, sob, DOE, pedal edema, headaches, dizziness or visual disturbances.  No complications from the medications currently being used.  Last BP :  120/64  SEIZURE DISORDER:  No complications reported from the medications presently being used. Staff do not report any recent seizure activity.  HYPERLIPIDEMIA: No complications from the medications presently being used. 9/15 fasting lipid panel showed : Cholesterol 198 triglycerides 72 HDL 44 LDL 140  PAST MEDICAL HISTORY : Reviewed.  No changes/see problem list  CURRENT MEDICATIONS: Reviewed per MAR/see medication list  REVIEW OF SYSTEMS:  GENERAL: no change in appetite, no fatigue, no weight changes, no fever, chills or weakness RESPIRATORY: no cough, SOB, DOE, wheezing, hemoptysis CARDIAC: no chest pain, edema or palpitations GI: no abdominal pain, diarrhea, constipation, heart burn, nausea or vomiting  PHYSICAL EXAMINATION  GENERAL: no acute distress, normal body habitus EYES: conjunctivae normal, sclerae normal, normal eye lids NECK: supple, trachea midline, no neck masses, no thyroid tenderness, no thyromegaly LYMPHATICS: no LAN in the neck, no supraclavicular LAN RESPIRATORY: breathing is even & unlabored, BS CTAB CARDIAC: RRR, no murmur,no extra heart sounds, no edema GI: abdomen soft, normal BS, no masses, no tenderness, no hepatomegaly, no  splenomegaly EXTREMITIES:  ambulates with cane NEUROLOGIC:   Has difficulty sequencing tasks PSYCHIATRIC: the patient is alert & oriented to person, affect & behavior appropriate  LABS/RADIOLOGY: Labs reviewed: Basic Metabolic Panel:  Recent Labs  64/40/34 1600 09/12/13 0333 09/15/13 0640  NA 129* 130* 128*  K 4.2 4.0 4.0  CL 93* 94* 92*  CO2 GLUCOSE 84 102* 86  BUN CREATININE 0.73 0.73 0.61  CALCIUM 8.7 8.6 8.6   Liver Function Tests:  Recent Labs  09/11/13 1600 09/12/13 0333  AST 20 18  ALT 21 19  ALKPHOS 103 101  BILITOT 0.3 0.3  PROT 6.7 6.5  ALBUMIN 3.4* 3.2*   CBC:  Recent Labs  09/11/13 1600 09/12/13 0333 09/15/13 0640  WBC 10.9* 10.5 8.2  NEUTROABS  --  6.5  --   HGB 14.8 14.2 14.5  HCT 40.9 39.4 39.9  MCV 94.5 94.5 93.4  PLT 270 254 273   Lipid Panel:  Recent Labs  09/12/13 0333  HDL 44   Cardiac Enzymes:  Recent Labs  09/11/13 2234  TROPONINI <0.30   CBG:  Recent Labs  09/11/13 1517 09/11/13 1548  GLUCAP 96 93    EXAM: CT HEAD WITHOUT CONTRAST   TECHNIQUE: Contiguous axial images were obtained from the base of the skull through the vertex without intravenous contrast.   COMPARISON:  None.   FINDINGS: Moderate diffuse atrophy. No hemorrhage or extra-axial fluid. No evidence of vascular territory infarct. Mild low attenuation in the deep periventricular white matter consistent with chronic involutional change. Prominent ovoid soft tissue in the sella turcica measuring 16 x 7 mm.  Numerous lytic areas throughout the calvarium, all measuring less than or equal to 8 mm. No significant inflammatory change in the visualized portions of the sinuses.   IMPRESSION: 1. Age-related involutional change 2. Soft tissue prominence in the sella turcica. Mass is not excluded. Recommend MRI preferably with contrast. 3. Numerous rounded lytic lesions in the calvarium. Lytic metastases not excluded. EXAM: CHEST  2  VIEW   COMPARISON:  01/22/2012   FINDINGS: Lungs are essentially clear. No focal consolidation. No pleural effusion or pneumothorax.   The heart is normal in size.   Visualized osseous structures are within normal limits.   IMPRESSION: No active cardiopulmonary disease. ADDENDUM: Subcentimeter lucencies in the skull at the vertex described on head CT are favored to represent arachnoid granulations, a common finding in this location. A 6 mm focus of abnormal marrow signal in the left parietal bone slightly away from midline (series 7, image 24) is indeterminate. Correlation with laboratory testing may be of use if myeloma is a clinical concern. ADDENDUM: Subcentimeter lucencies in the skull at the vertex described on head CT are favored to represent arachnoid granulations, a common finding in this location. A 6 mm focus of abnormal marrow signal in the left parietal bone slightly away from midline (series 7, image 24) is indeterminate. Correlation with laboratory testing may be of use if myeloma is a clinical concern.    ASSESSMENT/PLAN:  Stroke - stable; continue Plavix; for rehabilitation Hyperlipidemia - continue Lipitor Seizure - stable; continue Tegretol, Neurontin and Keppra Hypertension - well controlled; continue lisinopril Hyponatremia - check BMP   CPT CODE: 16109  Ella Bodo - NP [facility] [phone]

## 2013-09-21 ENCOUNTER — Telehealth: Payer: Self-pay | Admitting: Diagnostic Neuroimaging

## 2013-09-21 NOTE — Telephone Encounter (Signed)
Patient's brother Maurine Minister called and stated Dr. Pearlean Brownie decreased patient's Gabapentin.  Patient's at [facility] and still very mean.  Questioning if his Gabapentin should be increased.  Please call and advise.  May leave detailed message if not available.

## 2013-09-22 ENCOUNTER — Non-Acute Institutional Stay (SKILLED_NURSING_FACILITY): Payer: Medicare Other | Admitting: Internal Medicine

## 2013-09-22 DIAGNOSIS — I639 Cerebral infarction, unspecified: Secondary | ICD-10-CM | POA: Insufficient documentation

## 2013-09-22 DIAGNOSIS — I10 Essential (primary) hypertension: Secondary | ICD-10-CM

## 2013-09-22 DIAGNOSIS — G40909 Epilepsy, unspecified, not intractable, without status epilepticus: Secondary | ICD-10-CM

## 2013-09-22 DIAGNOSIS — I6350 Cerebral infarction due to unspecified occlusion or stenosis of unspecified cerebral artery: Secondary | ICD-10-CM

## 2013-09-22 DIAGNOSIS — J4489 Other specified chronic obstructive pulmonary disease: Secondary | ICD-10-CM | POA: Insufficient documentation

## 2013-09-22 DIAGNOSIS — J449 Chronic obstructive pulmonary disease, unspecified: Secondary | ICD-10-CM

## 2013-09-22 NOTE — Telephone Encounter (Signed)
I called and spoke to [place], brother to pt.    He is concerned because pt gets angry at times.  Feels like it could be related to gabapentin dose.  He is at [facility] for rehab.  I relayed that they have MD/NP there on site, if they or staff feel like pt needs some assistance then to can call us.  He verbalized understanding.
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
## 2013-10-07 ENCOUNTER — Encounter: Payer: Self-pay | Admitting: Adult Health

## 2013-10-07 ENCOUNTER — Non-Acute Institutional Stay (SKILLED_NURSING_FACILITY): Payer: Medicare Other | Admitting: Adult Health

## 2013-10-07 DIAGNOSIS — I10 Essential (primary) hypertension: Secondary | ICD-10-CM

## 2013-10-07 DIAGNOSIS — I639 Cerebral infarction, unspecified: Secondary | ICD-10-CM

## 2013-10-07 DIAGNOSIS — I6350 Cerebral infarction due to unspecified occlusion or stenosis of unspecified cerebral artery: Secondary | ICD-10-CM

## 2013-10-07 DIAGNOSIS — G40909 Epilepsy, unspecified, not intractable, without status epilepticus: Secondary | ICD-10-CM

## 2013-10-07 DIAGNOSIS — E785 Hyperlipidemia, unspecified: Secondary | ICD-10-CM

## 2013-10-07 DIAGNOSIS — J449 Chronic obstructive pulmonary disease, unspecified: Secondary | ICD-10-CM

## 2013-10-07 NOTE — Progress Notes (Signed)
Patient ID: Lee [name], male   DOB: 01/03/1951, 63 y.o.   MRN: [id number]              PROGRESS NOTE  DATE:      10/07/13  FACILITY: Nursing Home Location: [facility]  LEVEL OF CARE: SNF (31)  Discharge Notes   HISTORY OF PRESENT ILLNESS: This is a 63 year old male who is for discharge home with home health PT, OT and ST. He has been admitted to [facility] on 09/17/13 from [facility] with principal discharge diagnosis of Encephalopathy. Patient was admitted to this facility for short-term rehabilitation after the patient's recent hospitalization.  Patient has completed SNF rehabilitation and therapy has cleared the patient for discharge.  REASSESSMENT OF ONGOING PROBLEM(S):  HTN: Pt 's HTN remains stable.  Denies CP, sob, DOE, pedal edema, headaches, dizziness or visual disturbances.  No complications from the medications currently being used.  Last BP :  136/72  COPD: the COPD remains stable.  Pt denies sob, cough, wheezing or declining exercise tolerance.    SEIZURE DISORDER:  No complications reported from the medications presently being used. Staff do not report any recent seizure activity.  PAST MEDICAL HISTORY : Reviewed.  No changes/see problem list  CURRENT MEDICATIONS: Reviewed per MAR/see medication list  REVIEW OF SYSTEMS:  GENERAL: no fatigue, no weight changes, no fever, chills or weakness RESPIRATORY: no cough, SOB, DOE, wheezing, hemoptysis CARDIAC: no chest pain, edema or palpitations GI: no abdominal pain, diarrhea, constipation, heart burn, nausea or vomiting  PHYSICAL EXAMINATION  GENERAL: no acute distress, normal body habitus NECK: supple, trachea midline, no neck masses, no thyroid tenderness, no thyromegaly LYMPHATICS: no LAN in the neck, no supraclavicular LAN RESPIRATORY: breathing is even & unlabored, BS CTAB CARDIAC: RRR, no murmur,no extra heart sounds, no edema GI: abdomen soft, normal BS, no masses, no tenderness,  no hepatomegaly, no splenomegaly EXTREMITIES:  ambulates with cane; able to move all 4 extremities NEUROLOGIC:   Has difficulty sequencing tasks PSYCHIATRIC: the patient is alert & oriented to person, affect & behavior appropriate  LABS/RADIOLOGY: 09/25/13  sodium 132 potassium 4.1 glucose 90 BUN 9 creatinine 0.7 calcium 9.1 Labs reviewed: Basic Metabolic Panel:  Recent Labs  40/98/1107/04/23 1600 09/12/13 0333 09/15/13 0640  NA 129* 130* 128*  K 4.2 4.0 4.0  CL 93* 94* 92*  CO2 25 25 25   GLUCOSE 84 102* 86  BUN 10 9 8   CREATININE 0.73 0.73 0.61  CALCIUM 8.7 8.6 8.6   Liver Function Tests:  Recent Labs  09/11/13 1600 09/12/13 0333  AST 20 18  ALT 21 19  ALKPHOS 103 101  BILITOT 0.3 0.3  PROT 6.7 6.5  ALBUMIN 3.4* 3.2*   CBC:  Recent Labs  09/11/13 1600 09/12/13 0333 09/15/13 0640  WBC 10.9* 10.5 8.2  NEUTROABS  --  6.5  --   HGB 14.8 14.2 14.5  HCT 40.9 39.4 39.9  MCV 94.5 94.5 93.4  PLT 270 254 273   Lipid Panel:  Recent Labs  09/12/13 0333  HDL 44   Cardiac Enzymes:  Recent Labs  09/11/13 2234  TROPONINI <0.30   CBG:  Recent Labs  09/11/13 1517 09/11/13 1548  GLUCAP 96 93    EXAM: CT HEAD WITHOUT CONTRAST   TECHNIQUE: Contiguous axial images were obtained from the base of the skull through the vertex without intravenous contrast.   COMPARISON:  None.   FINDINGS: Moderate diffuse atrophy. No hemorrhage or extra-axial fluid.  No evidence of vascular territory infarct. Mild low attenuation in the deep periventricular white matter consistent with chronic involutional change. Prominent ovoid soft tissue in the sella turcica measuring 16 x 7 mm. Numerous lytic areas throughout the calvarium, all measuring less than or equal to 8 mm. No significant inflammatory change in the visualized portions of the sinuses.   IMPRESSION: 1. Age-related involutional change 2. Soft tissue prominence in the sella turcica. Mass is not excluded. Recommend  MRI preferably with contrast. 3. Numerous rounded lytic lesions in the calvarium. Lytic metastases not excluded. EXAM: CHEST  2 VIEW   COMPARISON:  01/22/2012   FINDINGS: Lungs are essentially clear. No focal consolidation. No pleural effusion or pneumothorax.   The heart is normal in size.   Visualized osseous structures are within normal limits.   IMPRESSION: No active cardiopulmonary disease. ADDENDUM: Subcentimeter lucencies in the skull at the vertex described on head CT are favored to represent arachnoid granulations, a common finding in this location. A 6 mm focus of abnormal marrow signal in the left parietal bone slightly away from midline (series 7, image 24) is indeterminate. Correlation with laboratory testing may be of use if myeloma is a clinical concern. ADDENDUM: Subcentimeter lucencies in the skull at the vertex described on head CT are favored to represent arachnoid granulations, a common finding in this location. A 6 mm focus of abnormal marrow signal in the left parietal bone slightly away from midline (series 7, image 24) is indeterminate. Correlation with laboratory testing may be of use if myeloma is a clinical concern.    ASSESSMENT/PLAN:  Stroke - stable; continue Plavix; for home health PT, OT and speech therapy Hyperlipidemia - continue Lipitor Seizure - stable; continue Tegretol, Neurontin and Keppra Hypertension - well controlled; continue lisinopril Hyponatremia - stable COPD - compensated   I have filled out patient's discharge paperwork and written prescriptions.  Patient will receive home health PT, OT and ST.  Total discharge time: Less than 30 minutes  Discharge time involved coordination of the discharge process with Child psychotherapist, nursing staff and therapy department. Medical justification for home health services verified.   CPT CODE: 16109  Ella Bodo - NP [facility] [phone]

## 2013-10-20 ENCOUNTER — Encounter (INDEPENDENT_AMBULATORY_CARE_PROVIDER_SITE_OTHER): Payer: Self-pay

## 2013-10-20 ENCOUNTER — Encounter: Payer: Self-pay | Admitting: Diagnostic Neuroimaging

## 2013-10-20 ENCOUNTER — Ambulatory Visit (INDEPENDENT_AMBULATORY_CARE_PROVIDER_SITE_OTHER): Payer: Medicare Other | Admitting: Diagnostic Neuroimaging

## 2013-10-20 VITALS — BP 112/71 | HR 79 | Ht 68.0 in | Wt 175.0 lb

## 2013-10-20 DIAGNOSIS — I6930 Unspecified sequelae of cerebral infarction: Secondary | ICD-10-CM

## 2013-10-20 DIAGNOSIS — G40109 Localization-related (focal) (partial) symptomatic epilepsy and epileptic syndromes with simple partial seizures, not intractable, without status epilepticus: Secondary | ICD-10-CM

## 2013-10-20 DIAGNOSIS — Z8673 Personal history of transient ischemic attack (TIA), and cerebral infarction without residual deficits: Secondary | ICD-10-CM

## 2013-10-20 MED ORDER — GABAPENTIN 600 MG PO TABS: 600.0000 mg | ORAL_TABLET | Freq: Three times a day (TID) | ORAL | Status: DC

## 2013-10-20 MED ORDER — CARBAMAZEPINE 200 MG PO TABS: 400.0000 mg | ORAL_TABLET | Freq: Two times a day (BID) | ORAL | Status: DC

## 2013-10-20 MED ORDER — LEVETIRACETAM 750 MG PO TABS: 750.0000 mg | ORAL_TABLET | Freq: Two times a day (BID) | ORAL | Status: DC

## 2013-10-20 MED ORDER — ATORVASTATIN CALCIUM 20 MG PO TABS: 20.0000 mg | ORAL_TABLET | Freq: Every day | ORAL | Status: DC

## 2013-10-20 MED ORDER — CLOPIDOGREL BISULFATE 75 MG PO TABS: 75.0000 mg | ORAL_TABLET | Freq: Every day | ORAL | Status: DC
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
## 2013-10-20 NOTE — Patient Instructions (Signed)
Continue current medications. 

## 2013-10-27 ENCOUNTER — Emergency Department (HOSPITAL_COMMUNITY): Payer: Medicare Other

## 2013-10-27 ENCOUNTER — Encounter (HOSPITAL_COMMUNITY): Payer: Self-pay | Admitting: Emergency Medicine

## 2013-10-27 ENCOUNTER — Emergency Department (HOSPITAL_COMMUNITY)
Admission: EM | Admit: 2013-10-27 | Discharge: 2013-10-27 | Discharge status: Against Medical Advice | Payer: Medicare Other

## 2013-10-27 ENCOUNTER — Emergency Department (HOSPITAL_COMMUNITY)
Admission: EM | Admit: 2013-10-27 | Discharge: 2013-10-27 | Disposition: A | Discharge status: Home / Self Care | Payer: Medicare Other | Attending: Emergency Medicine | Admitting: Emergency Medicine

## 2013-10-27 DIAGNOSIS — J449 Chronic obstructive pulmonary disease, unspecified: Secondary | ICD-10-CM | POA: Diagnosis not present

## 2013-10-27 DIAGNOSIS — G40409 Other generalized epilepsy and epileptic syndromes, not intractable, without status epilepticus: Secondary | ICD-10-CM | POA: Diagnosis not present

## 2013-10-27 DIAGNOSIS — Z8739 Personal history of other diseases of the musculoskeletal system and connective tissue: Secondary | ICD-10-CM | POA: Diagnosis not present

## 2013-10-27 DIAGNOSIS — Z9889 Other specified postprocedural states: Secondary | ICD-10-CM | POA: Insufficient documentation

## 2013-10-27 DIAGNOSIS — I10 Essential (primary) hypertension: Secondary | ICD-10-CM | POA: Diagnosis not present

## 2013-10-27 DIAGNOSIS — Z720 Tobacco use: Secondary | ICD-10-CM | POA: Diagnosis not present

## 2013-10-27 DIAGNOSIS — G4751 Confusional arousals: Secondary | ICD-10-CM | POA: Insufficient documentation

## 2013-10-27 DIAGNOSIS — Z880 Allergy status to penicillin: Secondary | ICD-10-CM | POA: Diagnosis not present

## 2013-10-27 DIAGNOSIS — Z8673 Personal history of transient ischemic attack (TIA), and cerebral infarction without residual deficits: Secondary | ICD-10-CM | POA: Insufficient documentation

## 2013-10-27 DIAGNOSIS — R4182 Altered mental status, unspecified: Secondary | ICD-10-CM | POA: Diagnosis present

## 2013-10-27 DIAGNOSIS — R410 Disorientation, unspecified: Secondary | ICD-10-CM

## 2013-10-27 DIAGNOSIS — Z87442 Personal history of urinary calculi: Secondary | ICD-10-CM | POA: Diagnosis not present

## 2013-10-27 DIAGNOSIS — Z79899 Other long term (current) drug therapy: Secondary | ICD-10-CM | POA: Diagnosis not present

## 2013-10-27 LAB — COMPREHENSIVE METABOLIC PANEL
ALK PHOS: 133 U/L — AB (ref 39–117)
ALT: 30 U/L (ref 0–53)
AST: 23 U/L (ref 0–37)
Albumin: 3.7 g/dL (ref 3.5–5.2)
Anion gap: 13 (ref 5–15)
BUN: 15 mg/dL (ref 6–23)
CO2: 24 mEq/L (ref 19–32)
Calcium: 9.1 mg/dL (ref 8.4–10.5)
Chloride: 98 mEq/L (ref 96–112)
Creatinine, Ser: 0.97 mg/dL (ref 0.50–1.35)
GFR calc Af Amer: >90 mL/min (ref >90)
GFR calc non Af Amer: 85 mL/min — ABNORMAL LOW (ref >90)
GLUCOSE: 86 mg/dL (ref 70–99)
POTASSIUM: 4.2 meq/L (ref 3.7–5.3)
Sodium: 135 mEq/L — ABNORMAL LOW (ref 137–147)
TOTAL PROTEIN: 7.5 g/dL (ref 6.0–8.3)
Total Bilirubin: 0.3 mg/dL (ref 0.3–1.2)

## 2013-10-27 LAB — CBC
HEMATOCRIT: 46 % (ref 39.0–52.0)
HEMOGLOBIN: 16.2 g/dL (ref 13.0–17.0)
MCH: 33.5 pg (ref 26.0–34.0)
MCHC: 35.2 g/dL (ref 30.0–36.0)
MCV: 95.2 fL (ref 78.0–100.0)
Platelets: 242 10*3/uL (ref 150–400)
RBC: 4.83 MIL/uL (ref 4.22–5.81)
RDW: 13.1 % (ref 11.5–15.5)
WBC: 8.3 10*3/uL (ref 4.0–10.5)

## 2013-10-27 LAB — AMMONIA: Ammonia: 31 umol/L (ref 11–60)

## 2013-10-27 LAB — CARBAMAZEPINE LEVEL, TOTAL: Carbamazepine Lvl: 9 ug/mL (ref 4.0–12.0)

## 2013-10-27 MED ORDER — SODIUM CHLORIDE 0.9 % IV SOLN
Freq: Once | INTRAVENOUS | Status: AC
  Administered 2013-10-27: 15:00:00 via INTRAVENOUS

## 2013-10-27 NOTE — ED Notes (Signed)
Patient transported to X-ray 

## 2013-10-27 NOTE — ED Notes (Signed)
Pt in with family c/o increased confusion over the last few days, pt had a stroke in September and has been having memory issues since that time but had been doing better in the last few weeks, pt oriented to person and place, states it is 1943 when asked year, alert, no distress noted

## 2013-10-27 NOTE — ED Notes (Signed)
Pt remains  Monitored by blood pressure, pulse ox, and 12 lead. pts CBG=84 reported to RN.

## 2013-10-27 NOTE — Discharge Instructions (Signed)
Confusion Confusion is the inability to think with your usual speed or clarity. Confusion may come on quickly or slowly over time. How quickly the confusion comes on depends on the cause. Confusion can be due to any number of causes. CAUSES   Concussion, head injury, or head trauma.  Seizures.  Stroke.  Fever.  Brain tumor.  Age related decreased brain function (dementia).  Heightened emotional states like rage or terror.  Mental illness in which the person loses the ability to determine what is real and what is not (hallucinations).  Infections such as a urinary tract infection (UTI).  Toxic effects from alcohol, drugs, or prescription medicines.  Dehydration and an imbalance of salts in the body (electrolytes).  Lack of sleep.  Low blood sugar (diabetes).  Low levels of oxygen from conditions such as chronic lung disorders.  Drug interactions or other medicine side effects.  Nutritional deficiencies, especially niacin, thiamine, vitamin C, or vitamin B.  Sudden drop in body temperature (hypothermia).  Change in routine, such as when traveling or hospitalized. SIGNS AND SYMPTOMS  People often describe their thinking as cloudy or unclear when they are confused. Confusion can also include feeling disoriented. That means you are unaware of where or who you are. You may also not know what the date or time is. If confused, you may also have difficulty paying attention, remembering, and making decisions. Some people also act aggressively when they are confused.  DIAGNOSIS  The medical evaluation of confusion may include:  Blood and urine tests.  X-rays.  Brain and nervous system tests.  Analyzing your brain waves (electroencephalogram or EEG).  Magnetic resonance imaging (MRI) of your head.  Computed tomography (CT) scan of your head.  Mental status tests in which your health care provider may ask many questions. Some of these questions may seem silly or strange,  but they are a very important test to help diagnose and treat confusion. TREATMENT  An admission to the hospital may not be needed, but a person with confusion should not be left alone. Stay with a family member or friend until the confusion clears. Avoid alcohol, pain relievers, or sedative drugs until you have fully recovered. Do not drive until directed by your health care provider. HOME CARE INSTRUCTIONS  What family and friends can do:  To find out if someone is confused, ask the person to state his or her name, age, and the date. If the person is unsure or answers incorrectly, he or she is confused.  Always introduce yourself, no matter how well the person knows you.  Often remind the person of his or her location.  Place a calendar and clock near the confused person.  Help the person with his or her medicines. You may want to use a pill box, an alarm as a reminder, or give the person each dose as prescribed.  Talk about current events and plans for the day.  Try to keep the environment calm, quiet, and peaceful.  Make sure the person keeps follow-up visits with his or her health care provider. PREVENTION  Ways to prevent confusion:  Avoid alcohol.  Eat a balanced diet.  Get enough sleep.  Take medicine only as directed by your health care provider.  Do not become isolated. Spend time with other people and make plans for your days.  Keep careful watch on your blood sugar levels if you are diabetic. SEEK IMMEDIATE MEDICAL CARE IF:   You develop severe headaches, repeated vomiting, seizures, blackouts, or   slurred speech.  There is increasing confusion, weakness, numbness, restlessness, or personality changes.  You develop a loss of balance, have marked dizziness, feel uncoordinated, or fall.  You have delusions, hallucinations, or develop severe anxiety.  Your family members think you need to be rechecked. Document Released: 02/02/2004 Document Revised: 05/11/2013  Document Reviewed: 01/30/2013 Exit[employer] Patient Information 2015 [employer]. This information is not intended to replace advice given to you by your health care provider. Make sure you discuss any questions you have with your health care provider.  

## 2013-10-27 NOTE — ED Notes (Addendum)
Pt remains monitored by blood pressure, pulse ox, and 5 lead. Pts family remains at bedside. Asked pt to provide urine specimen pt stated he could not provide one at this time. Provided pt with urinal.

## 2013-10-27 NOTE — ED Notes (Signed)
Pt placed into gown and on monitor upon arrival to room. Pt monitored by blood pressure, pulse ox, and 5 lead. Pts family remains at bedside.  
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
## 2013-10-27 NOTE — ED Provider Notes (Signed)
I saw and evaluated the patient, reviewed the resident's note and I agree with the findings and plan.   EKG Interpretation   Date/Time:  Tuesday October 27 2013 15:44:46 EDT Ventricular Rate:  57 PR Interval:  172 QRS Duration: 90 QT Interval:  421 QTC Calculation: 410 R Axis:   -49 Text Interpretation:  Sinus rhythm Left axis deviation Borderline  repolarization abnormality No acute changes Confirmed by Rhunette CroftNANAVATI, MD,  Janey GentaANKIT [phone] on 10/27/2013 3:55:57 PM       Patient wants to leave against medical advice. Patient understands that his/her actions will lead to inadequate medical workup, and that he/she is at risk of complications of missed diagnosis, which includes morbidity and mortality.  Patient is demonstrating good capacity to make decision. Patient understands that he/she needs to return to the ER immediately if his/her symptoms get worse.   Pt has AMS. He is aox2 for me, not sure what month, year it is. Not able to tell me his birthdate, but was able to state that earlier. Recent stroke. No focal weakness, numbness, and besides cognition, exam is non focal. Wife at bedside, comfortable with plan to take pt home, and see Neurology outpatient.  Will d/c.     ICD-9-CM ICD-10-CM  1. Confusion 298.9 R41.0       Derwood KaplanAnkit Matteo Banke, MD 10/27/13 1708

## 2013-10-28 LAB — CBG MONITORING, ED: Glucose-Capillary: 84 mg/dL (ref 70–99)

## 2013-11-01 ENCOUNTER — Other Ambulatory Visit: Payer: Self-pay | Admitting: Adult Health

## 2013-11-06 ENCOUNTER — Inpatient Hospital Stay (HOSPITAL_COMMUNITY)
Admission: EM | Admit: 2013-11-06 | Discharge: 2013-11-09 | DRG: 101 | Disposition: A | Discharge status: Home / Self Care | Payer: Medicare Other | Attending: Internal Medicine | Admitting: Internal Medicine

## 2013-11-06 ENCOUNTER — Other Ambulatory Visit: Payer: Self-pay

## 2013-11-06 ENCOUNTER — Emergency Department (HOSPITAL_COMMUNITY): Payer: Medicare Other

## 2013-11-06 ENCOUNTER — Inpatient Hospital Stay (HOSPITAL_COMMUNITY): Payer: Medicare Other

## 2013-11-06 ENCOUNTER — Encounter (HOSPITAL_COMMUNITY): Payer: Self-pay | Admitting: Emergency Medicine

## 2013-11-06 DIAGNOSIS — Z9114 Patient's other noncompliance with medication regimen: Secondary | ICD-10-CM | POA: Diagnosis present

## 2013-11-06 DIAGNOSIS — F0150 Vascular dementia without behavioral disturbance: Secondary | ICD-10-CM | POA: Diagnosis present

## 2013-11-06 DIAGNOSIS — R410 Disorientation, unspecified: Secondary | ICD-10-CM

## 2013-11-06 DIAGNOSIS — I639 Cerebral infarction, unspecified: Secondary | ICD-10-CM

## 2013-11-06 DIAGNOSIS — G40909 Epilepsy, unspecified, not intractable, without status epilepticus: Secondary | ICD-10-CM | POA: Diagnosis present

## 2013-11-06 DIAGNOSIS — F1290 Cannabis use, unspecified, uncomplicated: Secondary | ICD-10-CM | POA: Diagnosis present

## 2013-11-06 DIAGNOSIS — F1099 Alcohol use, unspecified with unspecified alcohol-induced disorder: Secondary | ICD-10-CM | POA: Diagnosis not present

## 2013-11-06 DIAGNOSIS — Z8673 Personal history of transient ischemic attack (TIA), and cerebral infarction without residual deficits: Secondary | ICD-10-CM | POA: Diagnosis not present

## 2013-11-06 DIAGNOSIS — Z7902 Long term (current) use of antithrombotics/antiplatelets: Secondary | ICD-10-CM

## 2013-11-06 DIAGNOSIS — I6330 Cerebral infarction due to thrombosis of unspecified cerebral artery: Secondary | ICD-10-CM | POA: Insufficient documentation

## 2013-11-06 DIAGNOSIS — Z880 Allergy status to penicillin: Secondary | ICD-10-CM

## 2013-11-06 DIAGNOSIS — J449 Chronic obstructive pulmonary disease, unspecified: Secondary | ICD-10-CM | POA: Diagnosis present

## 2013-11-06 DIAGNOSIS — F17210 Nicotine dependence, cigarettes, uncomplicated: Secondary | ICD-10-CM | POA: Diagnosis not present

## 2013-11-06 DIAGNOSIS — I10 Essential (primary) hypertension: Secondary | ICD-10-CM | POA: Diagnosis not present

## 2013-11-06 DIAGNOSIS — R4182 Altered mental status, unspecified: Secondary | ICD-10-CM

## 2013-11-06 DIAGNOSIS — M1990 Unspecified osteoarthritis, unspecified site: Secondary | ICD-10-CM | POA: Diagnosis not present

## 2013-11-06 DIAGNOSIS — G9340 Encephalopathy, unspecified: Secondary | ICD-10-CM

## 2013-11-06 DIAGNOSIS — E785 Hyperlipidemia, unspecified: Secondary | ICD-10-CM

## 2013-11-06 DIAGNOSIS — Z79899 Other long term (current) drug therapy: Secondary | ICD-10-CM | POA: Diagnosis not present

## 2013-11-06 DIAGNOSIS — Z885 Allergy status to narcotic agent status: Secondary | ICD-10-CM | POA: Diagnosis not present

## 2013-11-06 DIAGNOSIS — I6389 Other cerebral infarction: Secondary | ICD-10-CM

## 2013-11-06 DIAGNOSIS — Z823 Family history of stroke: Secondary | ICD-10-CM | POA: Diagnosis not present

## 2013-11-06 DIAGNOSIS — R569 Unspecified convulsions: Secondary | ICD-10-CM

## 2013-11-06 LAB — URINE MICROSCOPIC-ADD ON

## 2013-11-06 LAB — CBC WITH DIFFERENTIAL/PLATELET
Basophils Absolute: 0 10*3/uL (ref 0.0–0.1)
Basophils Relative: 0 % (ref 0–1)
Eosinophils Absolute: 0.3 10*3/uL (ref 0.0–0.7)
Eosinophils Relative: 3 % (ref 0–5)
HEMATOCRIT: 42.6 % (ref 39.0–52.0)
Hemoglobin: 15.2 g/dL (ref 13.0–17.0)
LYMPHS ABS: 3.1 10*3/uL (ref 0.7–4.0)
LYMPHS PCT: 38 % (ref 12–46)
MCH: 33.2 pg (ref 26.0–34.0)
MCHC: 35.7 g/dL (ref 30.0–36.0)
MCV: 93 fL (ref 78.0–100.0)
Monocytes Absolute: 0.6 10*3/uL (ref 0.1–1.0)
Monocytes Relative: 8 % (ref 3–12)
Neutro Abs: 4.1 10*3/uL (ref 1.7–7.7)
Neutrophils Relative %: 51 % (ref 43–77)
PLATELETS: 227 10*3/uL (ref 150–400)
RBC: 4.58 MIL/uL (ref 4.22–5.81)
RDW: 12.8 % (ref 11.5–15.5)
WBC: 8.1 10*3/uL (ref 4.0–10.5)

## 2013-11-06 LAB — COMPREHENSIVE METABOLIC PANEL
ALK PHOS: 143 U/L — AB (ref 39–117)
ALT: 29 U/L (ref 0–53)
AST: 24 U/L (ref 0–37)
Albumin: 3.5 g/dL (ref 3.5–5.2)
Anion gap: 12 (ref 5–15)
BUN: 12 mg/dL (ref 6–23)
CALCIUM: 9 mg/dL (ref 8.4–10.5)
CO2: 25 meq/L (ref 19–32)
Chloride: 101 mEq/L (ref 96–112)
Creatinine, Ser: 0.86 mg/dL (ref 0.50–1.35)
GFR, EST NON AFRICAN AMERICAN: 90 mL/min — AB (ref >90)
GLUCOSE: 146 mg/dL — AB (ref 70–99)
Potassium: 3.7 mEq/L (ref 3.7–5.3)
SODIUM: 138 meq/L (ref 137–147)
Total Bilirubin: 0.3 mg/dL (ref 0.3–1.2)
Total Protein: 7.1 g/dL (ref 6.0–8.3)

## 2013-11-06 LAB — URINALYSIS, ROUTINE W REFLEX MICROSCOPIC
Glucose, UA: NEGATIVE mg/dL
Ketones, ur: 15 mg/dL — AB
Leukocytes, UA: NEGATIVE
Nitrite: NEGATIVE
PROTEIN: 30 mg/dL — AB
Specific Gravity, Urine: 1.036 — ABNORMAL HIGH (ref 1.005–1.030)
Urobilinogen, UA: 1 mg/dL (ref 0.0–1.0)
pH: 6 (ref 5.0–8.0)

## 2013-11-06 LAB — CARBAMAZEPINE LEVEL, TOTAL: Carbamazepine Lvl: 7.1 ug/mL (ref 4.0–12.0)

## 2013-11-06 LAB — RAPID URINE DRUG SCREEN, HOSP PERFORMED
Amphetamines: NOT DETECTED
Barbiturates: NOT DETECTED
Benzodiazepines: NOT DETECTED
COCAINE: NOT DETECTED
OPIATES: NOT DETECTED
TETRAHYDROCANNABINOL: NOT DETECTED

## 2013-11-06 LAB — ETHANOL: Alcohol, Ethyl (B): <11 mg/dL (ref 0–11)

## 2013-11-06 LAB — I-STAT TROPONIN, ED: Troponin i, poc: 0.03 ng/mL (ref 0.00–0.08)

## 2013-11-06 MED ORDER — INFLUENZA VAC SPLIT QUAD 0.5 ML IM SUSY
0.5000 mL | PREFILLED_SYRINGE | INTRAMUSCULAR | Status: AC
  Administered 2013-11-07: 0.5 mL via INTRAMUSCULAR
  Filled 2013-11-06: qty 0.5

## 2013-11-06 MED ORDER — CLOPIDOGREL BISULFATE 75 MG PO TABS
75.0000 mg | ORAL_TABLET | Freq: Every day | ORAL | Status: DC
  Administered 2013-11-06 – 2013-11-09 (×4): 75 mg via ORAL
  Filled 2013-11-06 (×4): qty 1

## 2013-11-06 MED ORDER — ONDANSETRON HCL 4 MG/2ML IJ SOLN: 4.0000 mg | Freq: Four times a day (QID) | INTRAMUSCULAR | Status: DC | PRN

## 2013-11-06 MED ORDER — SODIUM CHLORIDE 0.9 % IV SOLN
INTRAVENOUS | Status: DC
  Administered 2013-11-07: 14:00:00 via INTRAVENOUS

## 2013-11-06 MED ORDER — ENOXAPARIN SODIUM 40 MG/0.4ML ~~LOC~~ SOLN
40.0000 mg | Freq: Every day | SUBCUTANEOUS | Status: DC
  Administered 2013-11-06 – 2013-11-09 (×4): 40 mg via SUBCUTANEOUS
  Filled 2013-11-06 (×5): qty 0.4

## 2013-11-06 MED ORDER — LISINOPRIL 20 MG PO TABS
20.0000 mg | ORAL_TABLET | Freq: Every day | ORAL | Status: DC
  Administered 2013-11-06 – 2013-11-07 (×2): 20 mg via ORAL
  Filled 2013-11-06 (×3): qty 1

## 2013-11-06 MED ORDER — GABAPENTIN 600 MG PO TABS
600.0000 mg | ORAL_TABLET | Freq: Three times a day (TID) | ORAL | Status: DC
  Administered 2013-11-06 – 2013-11-07 (×3): 600 mg via ORAL
  Filled 2013-11-06 (×5): qty 1

## 2013-11-06 MED ORDER — CARBAMAZEPINE 200 MG PO TABS
400.0000 mg | ORAL_TABLET | Freq: Two times a day (BID) | ORAL | Status: DC
  Administered 2013-11-06 – 2013-11-09 (×7): 400 mg via ORAL
  Filled 2013-11-06 (×8): qty 2

## 2013-11-06 MED ORDER — ATORVASTATIN CALCIUM 20 MG PO TABS
20.0000 mg | ORAL_TABLET | Freq: Every day | ORAL | Status: DC
  Filled 2013-11-06 (×3): qty 1

## 2013-11-06 MED ORDER — LEVETIRACETAM 750 MG PO TABS
750.0000 mg | ORAL_TABLET | Freq: Two times a day (BID) | ORAL | Status: DC
  Administered 2013-11-06 – 2013-11-07 (×3): 750 mg via ORAL
  Filled 2013-11-06 (×4): qty 1

## 2013-11-06 MED ORDER — LORAZEPAM 2 MG/ML IJ SOLN: 2.0000 mg | Freq: Four times a day (QID) | INTRAMUSCULAR | Status: DC | PRN

## 2013-11-06 MED ORDER — ONDANSETRON HCL 4 MG PO TABS: 4.0000 mg | ORAL_TABLET | Freq: Four times a day (QID) | ORAL | Status: DC | PRN
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
## 2013-11-06 NOTE — ED Notes (Signed)
Attempted to call report x 1  

## 2013-11-06 NOTE — ED Notes (Signed)
Pt attempting to void at the time.  

## 2013-11-06 NOTE — ED Notes (Signed)
ED PA at bedside
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
## 2013-11-06 NOTE — Progress Notes (Signed)
Pt transported to CT via stretcher.  

## 2013-11-06 NOTE — ED Notes (Signed)
Pt presents to department via [facility] for evaluation of seizure. Wife states he was shaking this morning and then became very aggressive, episode lasted x10 minutes. Upon arrival pt is confused, history of previous CVA with L sided deficits. Pt is alert, but unable to answer all questions correctly. No injuries noted. Able to move all extremities. CBG 97.

## 2013-11-06 NOTE — ED Notes (Signed)
Pt remains confused, able to follow commands. Vital signs stable. No signs of acute distress noted. Urine sent to lab for testing.

## 2013-11-06 NOTE — ED Provider Notes (Signed)
Medical screening examination/treatment/procedure(s) were conducted as a shared visit with non-physician practitioner(s) and myself.  I personally evaluated the patient during the encounter.   EKG Interpretation None       Briefly, pt is a 63 y.o. male presenting with altered mental status.  The pt has a ho similar symptoms with prior CVA.  I performed an examination on the patient including cardiac, pulmonary, and gi systems which were unremarkable, however the pt does have alteration in mental status, is oriented to person and place alone, and has poor understanding of why he is here.  He has no focal deficits.      Consulted medicine and admitted.   Mirian MoMatthew Mingo Siegert, MD 11/06/13 [phone]

## 2013-11-06 NOTE — Progress Notes (Signed)
Attempted x 1 top get pt for CT, lab at bedside. Will check back in a few minutes
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
## 2013-11-06 NOTE — ED Notes (Signed)
Attempted to call report [phone]

## 2013-11-06 NOTE — ED Notes (Addendum)
Wife called-- requesting update. States is can come pick pt up around 1pm if discharged.

## 2013-11-07 DIAGNOSIS — R410 Disorientation, unspecified: Secondary | ICD-10-CM

## 2013-11-07 DIAGNOSIS — G40909 Epilepsy, unspecified, not intractable, without status epilepticus: Secondary | ICD-10-CM | POA: Diagnosis not present

## 2013-11-07 DIAGNOSIS — R4182 Altered mental status, unspecified: Secondary | ICD-10-CM

## 2013-11-07 DIAGNOSIS — E785 Hyperlipidemia, unspecified: Secondary | ICD-10-CM

## 2013-11-07 DIAGNOSIS — I6330 Cerebral infarction due to thrombosis of unspecified cerebral artery: Secondary | ICD-10-CM | POA: Insufficient documentation

## 2013-11-07 DIAGNOSIS — I639 Cerebral infarction, unspecified: Secondary | ICD-10-CM

## 2013-11-07 DIAGNOSIS — I10 Essential (primary) hypertension: Secondary | ICD-10-CM

## 2013-11-07 LAB — HIV ANTIBODY (ROUTINE TESTING W REFLEX): HIV 1&2 Ab, 4th Generation: NONREACTIVE

## 2013-11-07 LAB — RPR

## 2013-11-07 LAB — HEMOGLOBIN A1C
Hgb A1c MFr Bld: 6.1 % — ABNORMAL HIGH (ref <5.7)
MEAN PLASMA GLUCOSE: 128 mg/dL — AB (ref <117)

## 2013-11-07 LAB — VITAMIN B12: VITAMIN B 12: 1173 pg/mL — AB (ref 211–911)

## 2013-11-07 LAB — LIPID PANEL
CHOLESTEROL: 131 mg/dL (ref 0–200)
HDL: 35 mg/dL — ABNORMAL LOW (ref >39)
LDL Cholesterol: 80 mg/dL (ref 0–99)
Total CHOL/HDL Ratio: 3.7 RATIO
Triglycerides: 78 mg/dL (ref <150)
VLDL: 16 mg/dL (ref 0–40)

## 2013-11-07 LAB — HOMOCYSTEINE: HOMOCYSTEINE-NORM: 7.9 umol/L (ref 4.0–15.4)

## 2013-11-07 LAB — TSH: TSH: 0.981 u[IU]/mL (ref 0.350–4.500)

## 2013-11-07 MED ORDER — VITAMIN B-1 100 MG PO TABS
100.0000 mg | ORAL_TABLET | Freq: Every day | ORAL | Status: DC
  Administered 2013-11-07 – 2013-11-09 (×3): 100 mg via ORAL
  Filled 2013-11-07 (×4): qty 1

## 2013-11-07 MED ORDER — LEVETIRACETAM 500 MG PO TABS
1000.0000 mg | ORAL_TABLET | Freq: Two times a day (BID) | ORAL | Status: DC
  Administered 2013-11-07 – 2013-11-09 (×4): 1000 mg via ORAL
  Filled 2013-11-07 (×5): qty 2

## 2013-11-07 MED ORDER — GABAPENTIN 300 MG PO CAPS
300.0000 mg | ORAL_CAPSULE | Freq: Two times a day (BID) | ORAL | Status: DC
  Administered 2013-11-07: 300 mg via ORAL
  Filled 2013-11-07 (×3): qty 1

## 2013-11-07 MED ORDER — GABAPENTIN 600 MG PO TABS
300.0000 mg | ORAL_TABLET | Freq: Two times a day (BID) | ORAL | Status: DC
  Filled 2013-11-07: qty 0.5

## 2013-11-07 MED ORDER — ATORVASTATIN CALCIUM 40 MG PO TABS
40.0000 mg | ORAL_TABLET | Freq: Every day | ORAL | Status: DC
  Administered 2013-11-07 – 2013-11-09 (×3): 40 mg via ORAL
  Filled 2013-11-07 (×3): qty 1

## 2013-11-07 NOTE — Progress Notes (Signed)
PATIENT DETAILS Name: Lee [name] Age: 63 y.o. Sex: male Date of Birth: [birth year] Admit Date: 11/06/2013 Admitting Physician Kathlen Mody, MD ZOX:[id number],VWUJW J, MD  Subjective: Mildly confused, mental status seems to be fluctuating.  Assessment/Plan: Active Problems:   Altered mental status:suspect that this is related to seizures. Although better, mental status seems to wax and wane. MRI showed a small acute CVA, which is unlikely related to AMS. Afebrile, neck is supple,no leukocytosis, UA neg for UTI and CXR neg for PNA. Not sure if patient has ?baseline dementia. Check EEG.   Break through Seizures:secondary to non compliance. Restarted on antiepiletics, seen by Neuro/Stroke team today, Keppra increased to 1000 mg BID, remainson usual dosing of tegretol.   Acute JXB:JYNW had recent work up done, hence wont repeat. Continue with Plavix   GNF:AOZHYQ: c/w Lisinopril   Dyslipidemia:c/w statin  Disposition: Remain inpatient  Antibiotics:  None  DVT Prophylaxis: Prophylactic Lovenox   Code Status: Full code  Family Communication None at bedside  Procedures:  None  CONSULTS:  neurology  Time spent 40 minutes-which includes 50% of the time with face-to-face with patient/ family and coordinating care related to the above assessment and plan.    MEDICATIONS: Scheduled Meds: . atorvastatin  40 mg Oral q1800  . carbamazepine  400 mg Oral BID  . clopidogrel  75 mg Oral Daily  . enoxaparin (LOVENOX) injection  40 mg Subcutaneous Daily  . gabapentin  300 mg Oral BID  . levETIRAcetam  1,000 mg Oral BID  . lisinopril  20 mg Oral Daily  . thiamine  100 mg Oral Daily   Continuous Infusions: . sodium chloride 50 mL/hr at 11/07/13 1350   PRN Meds:.LORazepam, ondansetron (ZOFRAN) IV, ondansetron  Antibiotics: Anti-infectives   None       PHYSICAL EXAM: Vital signs in last 24 hours: Filed Vitals:   11/06/13 1200 11/06/13 2046 11/07/13 0500  11/07/13 0913  BP: 128/76 132/73 149/76 128/74  Pulse: 59 60 59 61  Temp: 97.8 F (36.6 C) 98.2 F (36.8 C) 98.5 F (36.9 C) 98.3 F (36.8 C)  TempSrc: Oral Oral Oral Oral  Resp: 20 18 16 18   Height:      Weight:  73.982 kg (163 lb 1.6 oz)    SpO2: 100% 99% 100% 99%    Weight change:  Filed Weights   11/06/13 0740 11/06/13 2046  Weight: 79.379 kg (175 lb) 73.982 kg (163 lb 1.6 oz)   Body mass index is 24.07 kg/(m^2).   Gen Exam: Awake and mostly alert  Neck: Supple, No JVD.   Chest: B/L Clear.   CVS: S1 S2 Regular, no murmurs.  Abdomen: soft, BS +, non tender, non distended.  Extremities: no edema, lower extremities warm to touch. Neurologic: Non Focal.   Skin: No Rash.   Wounds: N/A.    Intake/Output from previous day:  Intake/Output Summary (Last 24 hours) at 11/07/13 1420 Last data filed at 11/07/13 0900  Gross per 24 hour  Intake    240 ml  Output      0 ml  Net    240 ml     LAB RESULTS: CBC  Recent Labs Lab 11/06/13 0800  WBC 8.1  HGB 15.2  HCT 42.6  PLT 227  MCV 93.0  MCH 33.2  MCHC 35.7  RDW 12.8  LYMPHSABS 3.1  MONOABS 0.6  EOSABS 0.3  BASOSABS 0.0    Chemistries   Recent Labs Lab 11/06/13 0800  NA 138  K 3.7  CL 101  CO2 25  GLUCOSE 146*  BUN 12  CREATININE 0.86  CALCIUM 9.0    CBG: No results found for this basename: GLUCAP,  in the last 168 hours  GFR Estimated Creatinine Clearance: 86.8 ml/min (by C-G formula based on Cr of 0.86).  Coagulation profile No results found for this basename: INR, PROTIME,  in the last 168 hours  Cardiac Enzymes No results found for this basename: CK, CKMB, TROPONINI, MYOGLOBIN,  in the last 168 hours  No components found with this basename: POCBNP,  No results found for this basename: DDIMER,  in the last 72 hours No results found for this basename: HGBA1C,  in the last 72 hours  Recent Labs  11/07/13 0624  CHOL 131  HDL 35*  LDLCALC 80  TRIG 78  CHOLHDL 3.7   No results  found for this basename: TSH, T4TOTAL, FREET3, T3FREE, THYROIDAB,  in the last 72 hours No results found for this basename: VITAMINB12, FOLATE, FERRITIN, TIBC, IRON, RETICCTPCT,  in the last 72 hours No results found for this basename: LIPASE, AMYLASE,  in the last 72 hours  Urine Studies No results found for this basename: UACOL, UAPR, USPG, UPH, UTP, UGL, UKET, UBIL, UHGB, UNIT, UROB, ULEU, UEPI, UWBC, URBC, UBAC, CAST, CRYS, UCOM, BILUA,  in the last 72 hours  MICROBIOLOGY: No results found for this or any previous visit (from the past 240 hour(s)).  RADIOLOGY STUDIES/RESULTS: Dg Chest 2 View  11/06/2013   CLINICAL DATA:  Altered mental status.  EXAM: CHEST  2 VIEW  COMPARISON:  10/27/2013  FINDINGS: The heart size and mediastinal contours are within normal limits. Both lungs are clear. The visualized skeletal structures are unremarkable.  IMPRESSION: No active cardiopulmonary disease.   Electronically Signed   By: Charlett Nose M.D.   On: 11/06/2013 08:58   Dg Chest 2 View  10/27/2013   CLINICAL DATA:  Altered mental status.  EXAM: CHEST  2 VIEW  COMPARISON:  September 12, 2013.  FINDINGS: The heart size and mediastinal contours are within normal limits. Both lungs are clear. No pneumothorax or pleural effusion is noted. The visualized skeletal structures are unremarkable.  IMPRESSION: No acute cardiopulmonary abnormality seen.   Electronically Signed   By: Roque Lias M.D.   On: 10/27/2013 16:18   Ct Head Wo Contrast  11/06/2013   CLINICAL DATA:  63 year old male with acute onset confusion, agitation, shaking. Initial encounter. Current history of previous stroke with left side deficits.  EXAM: CT HEAD WITHOUT CONTRAST  TECHNIQUE: Contiguous axial images were obtained from the base of the skull through the vertex without intravenous contrast.  COMPARISON:  10/27/2013 and earlier.  FINDINGS: Interval improved paranasal sinus aeration. Mastoids and tympanic cavities remain clear. No acute  osseous abnormality identified. Visualized orbits and scalp soft tissues are within normal limits.  Calcified atherosclerosis at the skull base. Stable cerebral volume. No ventriculomegaly. No midline shift, mass effect, or evidence of intracranial mass lesion. Small chronic infarcts in the left brainstem, left cerebellum, right thalamus. No acute intracranial hemorrhage identified. No evidence of cortically based acute infarction identified. No suspicious intracranial vascular hyperdensity.  IMPRESSION: Multifocal chronic small vessel ischemia. No new intracranial abnormality.   Electronically Signed   By: Augusto Gamble M.D.   On: 11/06/2013 08:30   Ct Head Wo Contrast  10/27/2013   CLINICAL DATA:  Altered mental status  EXAM: CT HEAD WITHOUT CONTRAST  TECHNIQUE: Contiguous axial images were  obtained from the base of the skull through the vertex without intravenous contrast.  COMPARISON:  09/11/2013  FINDINGS: Bony calvarium is intact. Mild atrophic changes are seen. There are changes of prior lacunar infarct within the left half of the pons similar to that noted on the prior exam. The changes previously seen in the thalamus bilaterally are less well visualized on this study. No findings to suggest acute hemorrhage or acute infarct are noted.  IMPRESSION: Chronic changes without acute abnormality.   Electronically Signed   By: Alcide Clever M.D.   On: 10/27/2013 16:43   Mr Brain Wo Contrast  11/06/2013   CLINICAL DATA:  Altered mental status  EXAM: MRI HEAD WITHOUT CONTRAST  TECHNIQUE: Multiplanar, multiecho pulse sequences of the brain and surrounding structures were obtained without intravenous contrast.  COMPARISON:  CT 11/06/2013.  MRI 09/11/2013  FINDINGS: Restricted diffusion in the right centrum semiovale compatible with acute infarct. This measures approximately 1 cm. This has developed since the prior MRI.  Generalized atrophy.  Negative for hydrocephalus  Chronic microvascular ischemic change in the  white matter. Chronic infarcts in the thalamus bilaterally. Chronic infarct in the left paramedian pons. Chronic left cerebellar infarct posteriorly.  Negative for hemorrhage or mass lesion. No shift of the midline structures.  Hippocampal atrophy bilaterally.  No hippocampal mass or edema.  IMPRESSION: 1 cm acute infarct right centrum semiovale  Moderate chronic microvascular ischemic change.   Electronically Signed   By: Marlan Palau M.D.   On: 11/06/2013 19:12    Jeoffrey Massed, MD  [facility] Pager:[phone]  If 7PM-7AM, please contact night-coverage [email or web address] Password [email or web address] 11/07/2013, 2:20 PM   LOS: 1 day

## 2013-11-07 NOTE — Progress Notes (Signed)
Mr. Mayford KnifeWilliams' Son  Was visiting today and had questions about the possibility for inpatient rehab for his father.  He is concerned that his father might be discharge too quickly and his mother not able to handle it at home.
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
## 2013-11-07 NOTE — Progress Notes (Signed)
SLP Cancellation Note  Patient Details Name: Lee EkeFranklin C Hudler MRN: [id number] DOB: 05/16/1950   Cancelled treatment:       Reason Eval/Treat Not Completed: SLP screened, no needs identified, will sign off for dysphagia. Per chart, patient has passed the RN stroke swallow screen and RN reports that he is doing well with regular textures and thin liquids. Will defer swallow evaluation at this time, however given that MRI was positive for acute infarct, recommend ordering SLP cognitive-linguistic evaluation per protocol.    Maxcine HamLaura Paiewonsky, M.A. CCC-SLP [phone]  Maxcine Hamaiewonsky, Tyla Burgner 11/07/2013, 9:18 AM

## 2013-11-08 LAB — CBC
HCT: 41.8 % (ref 39.0–52.0)
Hemoglobin: 15.1 g/dL (ref 13.0–17.0)
MCH: 33.6 pg (ref 26.0–34.0)
MCHC: 36.1 g/dL — ABNORMAL HIGH (ref 30.0–36.0)
MCV: 92.9 fL (ref 78.0–100.0)
PLATELETS: 239 10*3/uL (ref 150–400)
RBC: 4.5 MIL/uL (ref 4.22–5.81)
RDW: 12.7 % (ref 11.5–15.5)
WBC: 6.8 10*3/uL (ref 4.0–10.5)

## 2013-11-08 LAB — BASIC METABOLIC PANEL
ANION GAP: 12 (ref 5–15)
BUN: 7 mg/dL (ref 6–23)
CALCIUM: 8.9 mg/dL (ref 8.4–10.5)
CO2: 24 mEq/L (ref 19–32)
Chloride: 102 mEq/L (ref 96–112)
Creatinine, Ser: 0.72 mg/dL (ref 0.50–1.35)
GFR calc Af Amer: >90 mL/min (ref >90)
GFR calc non Af Amer: >90 mL/min (ref >90)
Glucose, Bld: 74 mg/dL (ref 70–99)
Potassium: 4.2 mEq/L (ref 3.7–5.3)
Sodium: 138 mEq/L (ref 137–147)

## 2013-11-08 MED ORDER — ACETAMINOPHEN 325 MG PO TABS: 650.0000 mg | ORAL_TABLET | Freq: Four times a day (QID) | ORAL | Status: DC | PRN

## 2013-11-08 NOTE — Evaluation (Signed)
Physical Therapy Evaluation Patient Details Name: Lee [name] MRN: [id number] DOB: 11/12/1950 Today's Date: 11/08/2013   History of Present Illness  HPI: Lee [name] is a 63 y.o. male with prior h/o hypertension, recent CVA, seizures was brought in by wife after an episode of confusion, non responsiveness and blank staring lasting a few minutes. On arrival to ED, he is comfortable and able to tell  Me his date of birth and that he is in the hospital, but no other history available. MRI showing 1 cm acute infarct right centrum semiovale  Clinical Impression   Pt admitted with above. Pt currently with functional limitations due to the deficits listed below (see PT Problem List).  Pt will benefit from skilled PT to increase their independence and safety with mobility to allow discharge to the venue listed below.   Overall, my impression is that Lee [name] has more cognitive deficits than deficits in functional mobility; Given cognitive deficits, it is worth looking into more supervision in the home, or, highly recommend looking into some type of adult daycare (PACE-like) services for Lee [name]; Pt's wife is interested in getting more information on this      Follow Up Recommendations No PT follow up (though look forward to OT and ST recs)    Equipment Recommendations  None recommended by PT    Recommendations for Other Services Other (comment);Speech consult (Case management/SW for adult daycare options)     Precautions / Restrictions Precautions Precautions: Fall      Mobility  Bed Mobility Overal bed mobility: Needs Assistance Bed Mobility: Supine to Sit     Supine to sit: Min assist     General bed mobility comments: Needed cues to initiate and handheld assist to pull to sit  Transfers Overall transfer level: Needs assistance Equipment used: None Transfers: Sit to/from Stand Sit to Stand: Min guard (without physical contact)         General transfer  comment: Cues for initiation  Ambulation/Gait Ambulation/Gait assistance: Supervision Ambulation Distance (Feet): 120 Feet Assistive device: None Gait Pattern/deviations: WFL(Within Functional Limits) Gait velocity: somewhat slow   General Gait Details: No gross loss of balance during amb  Stairs Stairs:  (plan for steps next session)          Wheelchair Mobility    Modified Rankin (Stroke Patients Only)       Balance Overall balance assessment: Needs assistance         Standing balance support: No upper extremity supported Standing balance-Leahy Scale: Fair Standing balance comment: will consider further balance testing next session                             Pertinent Vitals/Pain Pain Assessment: No/denies pain    Home Living Family/patient expects to be discharged to:: Private residence Living Arrangements: Spouse/significant other Available Help at Discharge: Family;Available PRN/intermittently (wife works (new job) and is gone during the day) Type of Home: House Home Access: Stairs to enter Entrance Stairs-Rails: Engineer, manufacturing of Steps: 3 Home Layout: One level Home Equipment: Cane - single point      Prior Function Level of Independence: Independent with assistive device(s)         Comments: has used SPC (named "Luther") for ambulation per OT note from about a month ago     Hand Dominance   Dominant Hand: Right    Extremity/Trunk Assessment   Upper Extremity Assessment: Overall WFL for tasks assessed  Lower Extremity Assessment: Overall WFL for tasks assessed      Cervical / Trunk Assessment: Normal  Communication   Communication: No difficulties  Cognition   Behavior During Therapy: WFL for tasks assessed/performed Overall Cognitive Status: History of cognitive impairments - at baseline       Memory: Decreased short-term memory              General Comments      Exercises         Assessment/Plan    PT Assessment Patient needs continued PT services (likely one more session)  PT Diagnosis Difficulty walking   PT Problem List Decreased balance;Decreased cognition;Decreased knowledge of use of DME;Decreased safety awareness  PT Treatment Interventions DME instruction;Gait training;Stair training;Balance training;Cognitive remediation;Patient/family education   PT Goals (Current goals can be found in the Care Plan section) Acute Rehab PT Goals Patient Stated Goal: did not state PT Goal Formulation: With patient/family Time For Goal Achievement: 11/15/13 Potential to Achieve Goals: Good    Frequency Min 3X/week   Barriers to discharge Decreased caregiver support Wife unable to give 24 hour assist/supervision; she works and is a Designer, fashion/clothing of Session   Activity Tolerance: Patient tolerated treatment well Patient left: in chair;with call bell/phone within reach;with chair alarm set;with family/visitor present Nurse Communication: Mobility status (and wife request)         Time: 4782-9562 PT Time Calculation (min): 35 min   Charges:   PT Evaluation $Initial PT Evaluation Tier I: 1 Procedure PT Treatments $Gait Training: 23-37 mins   PT G CodesOlen [name] 11/08/2013, 1:12 PM  Lee [name], Lee [name]  Acute Rehabilitation Services Pager [phone] Office [phone]
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
## 2013-11-08 NOTE — Progress Notes (Signed)
PATIENT DETAILS Name: Lee [name] Age: 63 y.o. Sex: male Date of Birth: [birth year] Admit Date: 11/06/2013 Admitting Physician Kathlen Mody, MD ZOX:[id number],VWUJW J, MD  Subjective: Mildly confused, mental status seems to be fluctuating.  Assessment/Plan: Active Problems:   Altered mental status: -suspect that this is related to seizures. Although , mental status seems to wax and wane. - MRI showed a small acute CVA, which is unlikely related to AMS. - Afebrile, neck is supple,no leukocytosis, UA neg for UTI and CXR neg for PNA. - EEG PENDING   Break through Seizures: -secondary to non compliance. Restarted on antiepiletics, seen by Neuro/Stroke team today, Keppra increased to 1000 mg BID, remainson usual dosing of tegretol,gabapentin was stopped. -EEG pending    Acute CVA: -just had recent work up done, hence wont repeat. Continue with Plavix   JXB:JYNWGN:  -discontinue Lisinopril as blood pressure on the lower side this morning    Dyslipidemia: -c/w statin  Disposition: Remain inpatient,Await PT/OT, may need subacute rehabilitation.  Antibiotics:  None  DVT Prophylaxis: Prophylactic Lovenox   Code Status: Full code  Family Communication None at bedside  Procedures:  None  CONSULTS:  neurology  Time spent 25 minutes   MEDICATIONS: Scheduled Meds: . atorvastatin  40 mg Oral q1800  . carbamazepine  400 mg Oral BID  . clopidogrel  75 mg Oral Daily  . enoxaparin (LOVENOX) injection  40 mg Subcutaneous Daily  . levETIRAcetam  1,000 mg Oral BID  . thiamine  100 mg Oral Daily   Continuous Infusions: . sodium chloride 50 mL/hr at 11/07/13 1350   PRN Meds:.LORazepam, ondansetron **OR** ondansetron (ZOFRAN) IV  Antibiotics: Anti-infectives    None       PHYSICAL EXAM: Vital signs in last 24 hours: Filed Vitals:   11/07/13 1700 11/07/13 2022 11/08/13 0531 11/08/13 0946  BP: 124/75 128/73 174/73 95/42  Pulse: 73 63 51 73  Temp:  98.4 F (36.9 C) 97.7 F (36.5 C) 98.6 F (37 C) 98.3 F (36.8 C)  TempSrc: Oral Oral Oral Oral  Resp: 19 20 18 19   Height:      Weight:      SpO2: 100% 99% 100% 100%    Weight change:  Filed Weights   11/06/13 0740 11/06/13 2046  Weight: 79.379 kg (175 lb) 73.982 kg (163 lb 1.6 oz)   Body mass index is 24.07 kg/(m^2).   Gen Exam: Awake and mostly alert  Neck: Supple, No JVD.   Chest: B/L Clear.   CVS: S1 S2 Regular, no murmurs.  Abdomen: soft, BS +, non tender, non distended.  Extremities: no edema, lower extremities warm to touch. Neurologic: Non Focal.   Skin: No Rash.   Wounds: N/A.    Intake/Output from previous day:  Intake/Output Summary (Last 24 hours) at 11/08/13 1117 Last data filed at 11/07/13 1700  Gross per 24 hour  Intake    600 ml  Output      0 ml  Net    600 ml     LAB RESULTS: CBC  Recent Labs Lab 11/06/13 0800 11/08/13 0437  WBC 8.1 6.8  HGB 15.2 15.1  HCT 42.6 41.8  PLT 227 239  MCV 93.0 92.9  MCH 33.2 33.6  MCHC 35.7 36.1*  RDW 12.8 12.7  LYMPHSABS 3.1  --   MONOABS 0.6  --   EOSABS 0.3  --   BASOSABS 0.0  --     Chemistries   Recent Labs  Lab 11/06/13 0800 11/08/13 0437  NA 138 138  K 3.7 4.2  CL 101 102  CO2 25 24  GLUCOSE 146* 74  BUN 12 7  CREATININE 0.86 0.72  CALCIUM 9.0 8.9    CBG: No results for input(s): GLUCAP in the last 168 hours.  GFR Estimated Creatinine Clearance: 93.3 mL/min (by C-G formula based on Cr of 0.72).  Coagulation profile No results for input(s): INR, PROTIME in the last 168 hours.  Cardiac Enzymes No results for input(s): CKMB, TROPONINI, MYOGLOBIN in the last 168 hours.  Invalid input(s): CK  Invalid input(s): POCBNP No results for input(s): DDIMER in the last 72 hours.  Recent Labs  11/07/13 0624  HGBA1C 6.1*    Recent Labs  11/07/13 0624  CHOL 131  HDL 35*  LDLCALC 80  TRIG 78  CHOLHDL 3.7    Recent Labs  11/07/13 1507  TSH 0.981    Recent Labs   11/07/13 1507  VITAMINB12 1173*   No results for input(s): LIPASE, AMYLASE in the last 72 hours.  Urine Studies No results for input(s): UHGB, CRYS in the last 72 hours.  Invalid input(s): UACOL, UAPR, USPG, UPH, UTP, UGL, UKET, UBIL, UNIT, UROB, ULEU, UEPI, UWBC, URBC, UBAC, CAST, UCOM, BILUA  MICROBIOLOGY: No results found for this or any previous visit (from the past 240 hour(s)).  RADIOLOGY STUDIES/RESULTS: Dg Chest 2 View  11/06/2013   CLINICAL DATA:  Altered mental status.  EXAM: CHEST  2 VIEW  COMPARISON:  10/27/2013  FINDINGS: The heart size and mediastinal contours are within normal limits. Both lungs are clear. The visualized skeletal structures are unremarkable.  IMPRESSION: No active cardiopulmonary disease.   Electronically Signed   By: Charlett Nose M.D.   On: 11/06/2013 08:58   Dg Chest 2 View  10/27/2013   CLINICAL DATA:  Altered mental status.  EXAM: CHEST  2 VIEW  COMPARISON:  September 12, 2013.  FINDINGS: The heart size and mediastinal contours are within normal limits. Both lungs are clear. No pneumothorax or pleural effusion is noted. The visualized skeletal structures are unremarkable.  IMPRESSION: No acute cardiopulmonary abnormality seen.   Electronically Signed   By: Roque Lias M.D.   On: 10/27/2013 16:18   Ct Head Wo Contrast  11/06/2013   CLINICAL DATA:  63 year old male with acute onset confusion, agitation, shaking. Initial encounter. Current history of previous stroke with left side deficits.  EXAM: CT HEAD WITHOUT CONTRAST  TECHNIQUE: Contiguous axial images were obtained from the base of the skull through the vertex without intravenous contrast.  COMPARISON:  10/27/2013 and earlier.  FINDINGS: Interval improved paranasal sinus aeration. Mastoids and tympanic cavities remain clear. No acute osseous abnormality identified. Visualized orbits and scalp soft tissues are within normal limits.  Calcified atherosclerosis at the skull base. Stable cerebral volume. No  ventriculomegaly. No midline shift, mass effect, or evidence of intracranial mass lesion. Small chronic infarcts in the left brainstem, left cerebellum, right thalamus. No acute intracranial hemorrhage identified. No evidence of cortically based acute infarction identified. No suspicious intracranial vascular hyperdensity.  IMPRESSION: Multifocal chronic small vessel ischemia. No new intracranial abnormality.   Electronically Signed   By: Augusto Gamble M.D.   On: 11/06/2013 08:30   Ct Head Wo Contrast  10/27/2013   CLINICAL DATA:  Altered mental status  EXAM: CT HEAD WITHOUT CONTRAST  TECHNIQUE: Contiguous axial images were obtained from the base of the skull through the vertex without intravenous contrast.  COMPARISON:  09/11/2013  FINDINGS: Bony calvarium is intact. Mild atrophic changes are seen. There are changes of prior lacunar infarct within the left half of the pons similar to that noted on the prior exam. The changes previously seen in the thalamus bilaterally are less well visualized on this study. No findings to suggest acute hemorrhage or acute infarct are noted.  IMPRESSION: Chronic changes without acute abnormality.   Electronically Signed   By: Alcide Clever M.D.   On: 10/27/2013 16:43   Mr Brain Wo Contrast  11/06/2013   CLINICAL DATA:  Altered mental status  EXAM: MRI HEAD WITHOUT CONTRAST  TECHNIQUE: Multiplanar, multiecho pulse sequences of the brain and surrounding structures were obtained without intravenous contrast.  COMPARISON:  CT 11/06/2013.  MRI 09/11/2013  FINDINGS: Restricted diffusion in the right centrum semiovale compatible with acute infarct. This measures approximately 1 cm. This has developed since the prior MRI.  Generalized atrophy.  Negative for hydrocephalus  Chronic microvascular ischemic change in the white matter. Chronic infarcts in the thalamus bilaterally. Chronic infarct in the left paramedian pons. Chronic left cerebellar infarct posteriorly.  Negative for hemorrhage  or mass lesion. No shift of the midline structures.  Hippocampal atrophy bilaterally.  No hippocampal mass or edema.  IMPRESSION: 1 cm acute infarct right centrum semiovale  Moderate chronic microvascular ischemic change.   Electronically Signed   By: Marlan Palau M.D.   On: 11/06/2013 19:12    Huey Bienenstock, MD  [facility] Pager:[phone]  If 7PM-7AM, please contact night-coverage [email or web address] Password [email or web address] 11/08/2013, 11:17 AM   LOS: 2 days

## 2013-11-08 NOTE — Plan of Care (Signed)
Problem: Consults Goal: Nutrition Consult-if indicated Outcome: Not Applicable Date Met:  11/08/13     

## 2013-11-09 ENCOUNTER — Inpatient Hospital Stay (HOSPITAL_COMMUNITY): Payer: Medicare Other

## 2013-11-09 DIAGNOSIS — I6330 Cerebral infarction due to thrombosis of unspecified cerebral artery: Secondary | ICD-10-CM

## 2013-11-09 LAB — LEVETIRACETAM LEVEL: Levetiracetam Lvl: 16.6 ug/mL

## 2013-11-09 MED ORDER — ATORVASTATIN CALCIUM 40 MG PO TABS: 40.0000 mg | ORAL_TABLET | Freq: Every day | ORAL | Status: DC

## 2013-11-09 MED ORDER — LEVETIRACETAM 1000 MG PO TABS: 1000.0000 mg | ORAL_TABLET | Freq: Two times a day (BID) | ORAL | Status: DC

## 2013-11-09 MED ORDER — THIAMINE HCL 100 MG PO TABS: 100.0000 mg | ORAL_TABLET | Freq: Every day | ORAL | Status: DC

## 2013-11-09 NOTE — Progress Notes (Signed)
EEG Completed; Results Pending  
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
## 2013-11-09 NOTE — Plan of Care (Signed)
Problem: Acute Treatment Outcomes Goal: Prognosis discussed with family/patient as appropriate Outcome: Completed/Met Date Met:  11/09/13  Problem: Progression Outcomes Goal: If vent dependent, tolerates weaning Outcome: Not Applicable Date Met:  76/22/63 Goal: Tolerating diet/TF at goal rate Outcome: Completed/Met Date Met:  11/09/13 Goal: Pain controlled Outcome: Completed/Met Date Met:  11/09/13

## 2013-11-09 NOTE — Progress Notes (Signed)
STROKE TEAM PROGRESS NOTE   HISTORY Lee [name] is an 63 y.o. male with known seizure disorder, history of CVA, medication noncompliance and worsening memory. Patient was recently in hospital for 2 generalized seizures and at that time found to have a acute left thalamic stroke. Medications were adjusted with decrease in Tegretol and since that date he has been doing well. Patient was last seen by his primary neurologist Dr. Marjory [name] on 10/20/2013. Majority of history was obtained from chart as patient is unable to give a detailed history. "Wife states that patient had an episode of blank stare, unresponsiveness, with open eyes lasting several minutes onset this morning. She states this is typical of his seizures. She admits the patient at times noncompliant with his medications. She however states confusion has been worsening over the last 3 days. She is worried that patient may have had another stroke. Patient at times is forgetful and confused." Currently he is awake, oriented to hospital, unable to give date, year, president. HE is following all commands. HE knows he was brought to the hospital due to his confusion but cannot give a detailed history.   Tegretol level 7.1  UA negative  UDS negative  Ethanol normal    SUBJECTIVE (INTERVAL HISTORY) He remains clearly confused. This was reason why he was admitted although his MRI shows a infarct this is unrelated to the confusion as involves the right corona radiata. He has dementia at baseline and had a seizure after admission as well.He is pleasant and cooperative this am   OBJECTIVE Temp:  [97.8 F (36.6 C)-98.3 F (36.8 C)] 97.8 F (36.6 C) (11/02 1017) Pulse Rate:  [54-64] 59 (11/02 1017) Cardiac Rhythm:  [-]  Resp:  [18] 18 (11/02 1017) BP: (146-166)/(75-84) 146/81 mmHg (11/02 1017) SpO2:  [98 %-100 %] 100 % (11/02 1017)  No results for input(s): GLUCAP in the last 168 hours.  Recent Labs Lab 11/06/13 0800 11/08/13 0437   NA 138 138  K 3.7 4.2  CL 101 102  CO2 25 24  GLUCOSE 146* 74  BUN 12 7  CREATININE 0.86 0.72  CALCIUM 9.0 8.9    Recent Labs Lab 11/06/13 0800  AST 24  ALT 29  ALKPHOS 143*  BILITOT 0.3  PROT 7.1  ALBUMIN 3.5    Recent Labs Lab 11/06/13 0800 11/08/13 0437  WBC 8.1 6.8  NEUTROABS 4.1  --   HGB 15.2 15.1  HCT 42.6 41.8  MCV 93.0 92.9  PLT 227 239   No results for input(s): CKTOTAL, CKMB, CKMBINDEX, TROPONINI in the last 168 hours. No results for input(s): LABPROT, INR in the last 72 hours. No results for input(s): COLORURINE, LABSPEC, PHURINE, GLUCOSEU, HGBUR, BILIRUBINUR, KETONESUR, PROTEINUR, UROBILINOGEN, NITRITE, LEUKOCYTESUR in the last 72 hours.  Invalid input(s): APPERANCEUR     Component Value Date/Time   CHOL 131 11/07/2013 0624   TRIG 78 11/07/2013 0624   HDL 35* 11/07/2013 0624   CHOLHDL 3.7 11/07/2013 0624   VLDL 16 11/07/2013 0624   LDLCALC 80 11/07/2013 0624   Lab Results  Component Value Date   HGBA1C 6.1* 11/07/2013      Component Value Date/Time   LABOPIA NONE DETECTED 11/06/2013 0939   COCAINSCRNUR NONE DETECTED 11/06/2013 0939   LABBENZ NONE DETECTED 11/06/2013 0939   AMPHETMU NONE DETECTED 11/06/2013 0939   THCU NONE DETECTED 11/06/2013 0939   LABBARB NONE DETECTED 11/06/2013 0939     Recent Labs Lab 11/06/13 0800  ETH <11    Dg  Chest 2 View 11/06/2013    No active cardiopulmonary disease.       Ct Head Wo Contrast 11/06/2013    Multifocal chronic small vessel ischemia. No new intracranial abnormality.       Mr Brain Wo Contrast 11/06/2013    1 cm acute infarct right centrum semiovale  Moderate chronic microvascular ischemic change.      MRA of the head  09/13/2013 Mild-to-moderate chronic small vessel ischemic disease.  Abnormal appearance of the distal left vertebral artery, incompletely evaluated but compatible with either occlusion or  high-grade stenosis in the neck.  TSH 0.91 carbamezapine level  7.1   PHYSICAL EXAM  GENERAL: Thin pleasant African american male in no acute distress.  HEENT: Supple. Atraumatic normocephalic.   ABDOMEN: soft  EXTREMITIES: No edema   BACK: Normal.  SKIN: Normal by inspection.    MENTAL STATUS:  He is awake and alert. He is not oriented except for place. He knows that he is at the hospital in [facility]. Patient has significant confusion and disorientation.[name]  He does follow commands briskly. His speech is nonsensical.   CRANIAL NERVES: Pupils are equal, round and reactive to light and accommodation; extra ocular movements are full, there is no significant nystagmus; visual fields are full; upper and lower facial muscles are normal in strength and symmetric, there is no flattening of the nasolabial folds; tongue is midline; uvula is midline; shoulder elevation is normal.  MOTOR: Normal tone, bulk and strength; no pronator drift.  COORDINATION: Left finger to nose is normal, right finger to nose is normal, No rest tremor; no intention tremor; no postural tremor; no bradykinesia.   SENSATION: Normal to light touch.  GAIT: The patient stands unassisted and has decent strides. Balance is normal.   ASSESSMENT/PLAN [name] is a 63 y.o. male with history of known seizure disorder, previous CVA, medication noncompliance, and memory problems, presenting with worsening confusion.  He didnot receive IV t-PA due to unknown time of onset.   Stroke:  Non-dominant 1 cm acute right centrum semiovale infarct secondary to small vessel disease source .Stroke likely asypmtomatic and confusion related to baseline dementia  MRI - as above  MRA - 09/13/2013  Carotid Doppler - 09/12/2013 - within normal limits except unable to visualize left vertebral artery   2D Echo 09/13/2013 - ejection fraction 55-60%. No cardiac source of emboli identified.  LDL 80  HgbA1c - 6.1  Lovenox for VTE prophylaxis  Diet - low sodium heart healthyHeart healthy  diet with thin  liquids  Bedrest  clopidogrel 75 mg orally every day prior to admission, now on clopidogrel 75 mg orally every day  Patient counseled to be compliant with his antithrombotic medications  Risk factor education  Ongoing aggressive risk factor management  Resultant None  Therapy recommendations: No PT or speech follow up recommended  Disposition:  Home when ready.  No new stroke w/u.   Hypertension  Home meds: Lisinopril 20 mg daily   Stable  Permissive hypertension (OK if < 220/120) but gradually normalize in 5-7 days  Patient counseled to be compliant with his blood pressure medications  Hyperlipidemia  Home meds:  Lipitor 20 mg daily  resumed in hospital  LDL 80 goal < 70  Increased Lipitor to 40 mg daily   Continue statin at discharge   Other Stroke Risk Factors  Cigarette smoker, advised to stop smoking Hx stroke/TIA . Family hx stroke (father)   Other Active Problems  Medical noncompliance history  Confusion - (RPR - non reactive) (Homocysteine - WNL) (B12 - H - 1173) (TSH - WNL)  (HIV - non reactive)  Other Pertinent History   Seizure disorder  - EEG pending  Keppra increased to 1,000 mg BID (Level pending)  Gabapentin dc'd - Tegretol unchanged at 400 mg BID  DC NEURONTIN.  AMS-- Due to seizures.  Likely baseline cognitive impairment/early dementia.    [facility]   Hospital day # 3  Delia HeadyPramod Sethi, MD  11/09/2013, 3:48 PM    To contact Stroke Continuity provider, please refer to [email or web address]. After hours, contact General Neurology

## 2013-11-09 NOTE — Progress Notes (Signed)
11/09/2013 3:33 PM  Attempted to notify [name] (spouse) of patient's discharge orders.  No response--left voicemail. Theadora RamaKIRKMAN, Lyrica Mcclarty Brooke

## 2013-11-09 NOTE — Care Management Note (Signed)
CARE MANAGEMENT NOTE 11/09/2013  Patient:  GARMON, DEHN   Account Number:  [id number]  Date Initiated:  11/09/2013  Documentation initiated by:  Laylynn Campanella  Subjective/Objective Assessment:   CM following for progression and d/c planning.     Action/Plan:   11/09/13 Met with pt and spoke with wife x2 by phone. Plan is for d/c to home with [facility] services for in home evaluation.   Anticipated DC Date:  11/09/2013   Anticipated DC Plan:  [place]         Choice offered to / List presented to:  C-3 Spouse        HH arranged  HH-1 RN  [place]   Status of service:  Completed, signed off Medicare Important Message given?  YES (If response is "NO", the following Medicare IM given date fields will be blank) Date Medicare IM given:  11/09/2013 Medicare IM given by:  Mckenzee Beem Date Additional Medicare IM given:   Additional Medicare IM given by:    Discharge Disposition:  [place]  Per UR Regulation:    If discussed at Long Length of Stay Meetings, dates discussed:    Comments:

## 2013-11-09 NOTE — Discharge Instructions (Signed)
Follow with Primary MD Geraldo PitterBLAND,VEITA J, MD in 7 days  Follow-up with urology 1 week from discharge Do not drive or operate any machinery unless cleared by  Your neurologist. Get CBC, CMP, 2 view Chest X ray checked  by Primary MD next visit.    Activity: As tolerated with Full fall precautions use walker/cane & assistance as needed   Disposition Home    Diet: Heart Healthy  , with feeding assistance and aspiration precautions as needed.  For Heart failure patients - Check your Weight same time everyday, if you gain over 2 pounds, or you develop in leg swelling, experience more shortness of breath or chest pain, call your Primary MD immediately. Follow Cardiac Low Salt Diet and 1.8 lit/day fluid restriction.   On your next visit with your primary care physician please Get Medicines reviewed and adjusted.   Please request your Prim.MD to go over all Hospital Tests and Procedure/Radiological results at the follow up, please get all Hospital records sent to your Prim MD by signing hospital release before you go home.   If you experience worsening of your admission symptoms, develop shortness of breath, life threatening emergency, suicidal or homicidal thoughts you must seek medical attention immediately by calling 911 or calling your MD immediately  if symptoms less severe.  You Must read complete instructions/literature along with all the possible adverse reactions/side effects for all the Medicines you take and that have been prescribed to you. Take any new Medicines after you have completely understood and accpet all the possible adverse reactions/side effects.   Do not drive, operating heavy machinery, perform activities at heights, swimming or participation in water activities or provide baby sitting services if your were admitted for syncope or siezures until you have seen by Primary MD or a Neurologist and advised to do so again.  Do not drive when taking Pain medications.    Do not  take more than prescribed Pain, Sleep and Anxiety Medications  Special Instructions: If you have smoked or chewed Tobacco  in the last 2 yrs please stop smoking, stop any regular Alcohol  and or any Recreational drug use.  Wear Seat belts while driving.   Please note  You were cared for by a hospitalist during your hospital stay. If you have any questions about your discharge medications or the care you received while you were in the hospital after you are discharged, you can call the unit and asked to speak with the hospitalist on call if the hospitalist that took care of you is not available. Once you are discharged, your primary care physician will handle any further medical issues. Please note that NO REFILLS for any discharge medications will be authorized once you are discharged, as it is imperative that you return to your primary care physician (or establish a relationship with a primary care physician if you do not have one) for your aftercare needs so that they can reassess your need for medications and monitor your lab values.

## 2013-11-10 DIAGNOSIS — R404 Transient alteration of awareness: Secondary | ICD-10-CM

## 2013-11-10 NOTE — Procedures (Signed)
ELECTROENCEPHALOGRAM REPORT  Patient: Lee [name]       Room #: 2N566E13 EEG No. ID: [id number] Age: 63 y.o.        Sex: male Referring Physician: Pearlean BrownieSethi Report Date:  11/10/2013        Interpreting Physician: Aline BrochureSTEWART,Zyla Dascenzo R  History: Lee [name] is an 63 y.o. male with a history of left thalamic stroke and known seizure disorder admitted following an episode of confusion and inattentiveness as well as recurrent acute left CVA.  Indications for study:  Rule out seizure activity.  Technique: This is an 18 channel routine scalp EEG performed at the bedside with bipolar and monopolar montages arranged in accordance to the international 10/20 system of electrode placement.   Description: This EEG recording was performed during wakefulness and during brief periods of sleep. Predominant background activity consisted of moderate amplitude diffuse mixed delta and theta activity which was symmetrical. Photic stimulation and hyperventilation were not performed. During periods of light sleep symmetrical sleep spindles and arousal responses were recorded. There were multiple brief occurrences of left frontotemporal phase reversing sharp wave discharges.  Interpretation: This EEG is abnormal with generalized continuous nonspecific slowing of cerebral activity of moderate severity. In addition, there was evidence of a left frontotemporal area of epileptogenic potential.   Venetia MaxonR Erez Mccallum M.D. [facility] [phone]

## 2013-11-14 ENCOUNTER — Inpatient Hospital Stay (HOSPITAL_COMMUNITY)
Admission: EM | Admit: 2013-11-14 | Discharge: 2013-11-20 | DRG: 100 | Disposition: A | Discharge status: Skilled Nursing Facility | Payer: Medicare Other | Attending: Internal Medicine | Admitting: Internal Medicine

## 2013-11-14 ENCOUNTER — Emergency Department (HOSPITAL_COMMUNITY): Payer: Medicare Other

## 2013-11-14 ENCOUNTER — Observation Stay (HOSPITAL_COMMUNITY): Payer: Medicare Other

## 2013-11-14 ENCOUNTER — Encounter (HOSPITAL_COMMUNITY): Payer: Self-pay | Admitting: *Deleted

## 2013-11-14 DIAGNOSIS — J189 Pneumonia, unspecified organism: Secondary | ICD-10-CM | POA: Diagnosis present

## 2013-11-14 DIAGNOSIS — G40301 Generalized idiopathic epilepsy and epileptic syndromes, not intractable, with status epilepticus: Secondary | ICD-10-CM | POA: Diagnosis not present

## 2013-11-14 DIAGNOSIS — Z79899 Other long term (current) drug therapy: Secondary | ICD-10-CM

## 2013-11-14 DIAGNOSIS — Z8249 Family history of ischemic heart disease and other diseases of the circulatory system: Secondary | ICD-10-CM | POA: Diagnosis not present

## 2013-11-14 DIAGNOSIS — G4089 Other seizures: Secondary | ICD-10-CM | POA: Diagnosis not present

## 2013-11-14 DIAGNOSIS — I69354 Hemiplegia and hemiparesis following cerebral infarction affecting left non-dominant side: Secondary | ICD-10-CM | POA: Diagnosis not present

## 2013-11-14 DIAGNOSIS — J449 Chronic obstructive pulmonary disease, unspecified: Secondary | ICD-10-CM | POA: Diagnosis present

## 2013-11-14 DIAGNOSIS — M25569 Pain in unspecified knee: Secondary | ICD-10-CM

## 2013-11-14 DIAGNOSIS — I10 Essential (primary) hypertension: Secondary | ICD-10-CM | POA: Diagnosis present

## 2013-11-14 DIAGNOSIS — Z888 Allergy status to other drugs, medicaments and biological substances status: Secondary | ICD-10-CM | POA: Diagnosis not present

## 2013-11-14 DIAGNOSIS — R059 Cough, unspecified: Secondary | ICD-10-CM

## 2013-11-14 DIAGNOSIS — F0150 Vascular dementia without behavioral disturbance: Secondary | ICD-10-CM | POA: Diagnosis present

## 2013-11-14 DIAGNOSIS — M11262 Other chondrocalcinosis, left knee: Secondary | ICD-10-CM | POA: Diagnosis present

## 2013-11-14 DIAGNOSIS — E222 Syndrome of inappropriate secretion of antidiuretic hormone: Secondary | ICD-10-CM | POA: Diagnosis present

## 2013-11-14 DIAGNOSIS — Z87891 Personal history of nicotine dependence: Secondary | ICD-10-CM | POA: Diagnosis not present

## 2013-11-14 DIAGNOSIS — Z7902 Long term (current) use of antithrombotics/antiplatelets: Secondary | ICD-10-CM

## 2013-11-14 DIAGNOSIS — F1010 Alcohol abuse, uncomplicated: Secondary | ICD-10-CM | POA: Diagnosis present

## 2013-11-14 DIAGNOSIS — G9340 Encephalopathy, unspecified: Secondary | ICD-10-CM | POA: Diagnosis present

## 2013-11-14 DIAGNOSIS — I739 Peripheral vascular disease, unspecified: Secondary | ICD-10-CM | POA: Diagnosis present

## 2013-11-14 DIAGNOSIS — Z91018 Allergy to other foods: Secondary | ICD-10-CM

## 2013-11-14 DIAGNOSIS — J690 Pneumonitis due to inhalation of food and vomit: Secondary | ICD-10-CM | POA: Diagnosis present

## 2013-11-14 DIAGNOSIS — R569 Unspecified convulsions: Secondary | ICD-10-CM

## 2013-11-14 DIAGNOSIS — I672 Cerebral atherosclerosis: Secondary | ICD-10-CM | POA: Diagnosis present

## 2013-11-14 DIAGNOSIS — D72829 Elevated white blood cell count, unspecified: Secondary | ICD-10-CM

## 2013-11-14 DIAGNOSIS — R05 Cough: Secondary | ICD-10-CM

## 2013-11-14 DIAGNOSIS — Z9119 Patient's noncompliance with other medical treatment and regimen: Secondary | ICD-10-CM | POA: Diagnosis present

## 2013-11-14 DIAGNOSIS — F1290 Cannabis use, unspecified, uncomplicated: Secondary | ICD-10-CM | POA: Diagnosis present

## 2013-11-14 DIAGNOSIS — Z823 Family history of stroke: Secondary | ICD-10-CM

## 2013-11-14 DIAGNOSIS — Z833 Family history of diabetes mellitus: Secondary | ICD-10-CM

## 2013-11-14 DIAGNOSIS — Z880 Allergy status to penicillin: Secondary | ICD-10-CM

## 2013-11-14 DIAGNOSIS — G9341 Metabolic encephalopathy: Secondary | ICD-10-CM | POA: Diagnosis present

## 2013-11-14 DIAGNOSIS — G40901 Epilepsy, unspecified, not intractable, with status epilepticus: Secondary | ICD-10-CM | POA: Diagnosis present

## 2013-11-14 LAB — COMPREHENSIVE METABOLIC PANEL
ALBUMIN: 3.5 g/dL (ref 3.5–5.2)
ALK PHOS: 124 U/L — AB (ref 39–117)
ALT: 28 U/L (ref 0–53)
AST: 24 U/L (ref 0–37)
Anion gap: 16 — ABNORMAL HIGH (ref 5–15)
BILIRUBIN TOTAL: 0.4 mg/dL (ref 0.3–1.2)
BUN: 10 mg/dL (ref 6–23)
CHLORIDE: 102 meq/L (ref 96–112)
CO2: 21 mEq/L (ref 19–32)
Calcium: 9 mg/dL (ref 8.4–10.5)
Creatinine, Ser: 0.76 mg/dL (ref 0.50–1.35)
GFR calc Af Amer: >90 mL/min (ref >90)
GFR calc non Af Amer: >90 mL/min (ref >90)
Glucose, Bld: 138 mg/dL — ABNORMAL HIGH (ref 70–99)
POTASSIUM: 3.6 meq/L — AB (ref 3.7–5.3)
SODIUM: 139 meq/L (ref 137–147)
Total Protein: 7.1 g/dL (ref 6.0–8.3)

## 2013-11-14 LAB — CBG MONITORING, ED: Glucose-Capillary: 131 mg/dL — ABNORMAL HIGH (ref 70–99)

## 2013-11-14 LAB — DIFFERENTIAL
Basophils Absolute: 0 10*3/uL (ref 0.0–0.1)
Basophils Relative: 0 % (ref 0–1)
EOS ABS: 0 10*3/uL (ref 0.0–0.7)
Eosinophils Relative: 0 % (ref 0–5)
LYMPHS ABS: 1.6 10*3/uL (ref 0.7–4.0)
Lymphocytes Relative: 9 % — ABNORMAL LOW (ref 12–46)
MONOS PCT: 6 % (ref 3–12)
Monocytes Absolute: 1 10*3/uL (ref 0.1–1.0)
NEUTROS ABS: 15.7 10*3/uL — AB (ref 1.7–7.7)
Neutrophils Relative %: 85 % — ABNORMAL HIGH (ref 43–77)

## 2013-11-14 LAB — MRSA PCR SCREENING: MRSA BY PCR: NEGATIVE

## 2013-11-14 LAB — APTT: aPTT: 29 seconds (ref 24–37)

## 2013-11-14 LAB — I-STAT TROPONIN, ED: Troponin i, poc: 0.04 ng/mL (ref 0.00–0.08)

## 2013-11-14 LAB — CBC
HCT: 41.6 % (ref 39.0–52.0)
Hemoglobin: 14.9 g/dL (ref 13.0–17.0)
MCH: 33.2 pg (ref 26.0–34.0)
MCHC: 35.8 g/dL (ref 30.0–36.0)
MCV: 92.7 fL (ref 78.0–100.0)
PLATELETS: 298 10*3/uL (ref 150–400)
RBC: 4.49 MIL/uL (ref 4.22–5.81)
RDW: 12.7 % (ref 11.5–15.5)
WBC: 18.3 10*3/uL — ABNORMAL HIGH (ref 4.0–10.5)

## 2013-11-14 LAB — PROTIME-INR
INR: 1.18 (ref 0.00–1.49)
PROTHROMBIN TIME: 15.1 s (ref 11.6–15.2)

## 2013-11-14 LAB — CARBAMAZEPINE LEVEL, TOTAL: CARBAMAZEPINE LVL: 5.4 ug/mL (ref 4.0–12.0)

## 2013-11-14 MED ORDER — DEXTROSE 5 % IV SOLN
1.0000 g | Freq: Three times a day (TID) | INTRAVENOUS | Status: DC
  Administered 2013-11-14 – 2013-11-20 (×18): 1 g via INTRAVENOUS
  Filled 2013-11-14 (×20): qty 1

## 2013-11-14 MED ORDER — LEVETIRACETAM IN NACL 1500 MG/100ML IV SOLN
1500.0000 mg | Freq: Two times a day (BID) | INTRAVENOUS | Status: DC
  Administered 2013-11-15 – 2013-11-16 (×3): 1500 mg via INTRAVENOUS
  Filled 2013-11-14 (×5): qty 100

## 2013-11-14 MED ORDER — CETYLPYRIDINIUM CHLORIDE 0.05 % MT LIQD
7.0000 mL | Freq: Two times a day (BID) | OROMUCOSAL | Status: DC
  Administered 2013-11-15 – 2013-11-19 (×9): 7 mL via OROMUCOSAL

## 2013-11-14 MED ORDER — VANCOMYCIN HCL IN DEXTROSE 750-5 MG/150ML-% IV SOLN
750.0000 mg | Freq: Three times a day (TID) | INTRAVENOUS | Status: DC
  Administered 2013-11-14 – 2013-11-15 (×2): 750 mg via INTRAVENOUS
  Filled 2013-11-14 (×4): qty 150

## 2013-11-14 MED ORDER — ENOXAPARIN SODIUM 40 MG/0.4ML ~~LOC~~ SOLN
40.0000 mg | SUBCUTANEOUS | Status: DC
  Administered 2013-11-15 – 2013-11-19 (×5): 40 mg via SUBCUTANEOUS
  Filled 2013-11-14 (×6): qty 0.4

## 2013-11-14 MED ORDER — LORAZEPAM 2 MG/ML IJ SOLN
1.0000 mg | INTRAMUSCULAR | Status: DC | PRN
  Administered 2013-11-16: 1 mg via INTRAVENOUS
  Filled 2013-11-14: qty 1

## 2013-11-14 MED ORDER — ALBUTEROL SULFATE (2.5 MG/3ML) 0.083% IN NEBU: 2.5000 mg | INHALATION_SOLUTION | Freq: Four times a day (QID) | RESPIRATORY_TRACT | Status: DC | PRN

## 2013-11-14 MED ORDER — ONDANSETRON HCL 4 MG PO TABS: 4.0000 mg | ORAL_TABLET | Freq: Four times a day (QID) | ORAL | Status: DC | PRN

## 2013-11-14 MED ORDER — SODIUM CHLORIDE 0.9 % IV SOLN
100.0000 mg | Freq: Two times a day (BID) | INTRAVENOUS | Status: DC
  Administered 2013-11-14 – 2013-11-15 (×3): 100 mg via INTRAVENOUS
  Filled 2013-11-14 (×9): qty 10

## 2013-11-14 MED ORDER — CLOPIDOGREL BISULFATE 75 MG PO TABS
75.0000 mg | ORAL_TABLET | Freq: Every day | ORAL | Status: DC
  Administered 2013-11-15 – 2013-11-20 (×6): 75 mg via ORAL
  Filled 2013-11-14 (×7): qty 1

## 2013-11-14 MED ORDER — SODIUM CHLORIDE 0.9 % IV SOLN
INTRAVENOUS | Status: DC
  Administered 2013-11-14: 17:00:00 via INTRAVENOUS

## 2013-11-14 MED ORDER — SODIUM CHLORIDE 0.9 % IV SOLN
200.0000 mg | Freq: Once | INTRAVENOUS | Status: AC
  Administered 2013-11-14: 200 mg via INTRAVENOUS
  Filled 2013-11-14: qty 20

## 2013-11-14 MED ORDER — ONDANSETRON HCL 4 MG/2ML IJ SOLN: 4.0000 mg | Freq: Four times a day (QID) | INTRAMUSCULAR | Status: DC | PRN

## 2013-11-14 MED ORDER — VITAMIN B-1 100 MG PO TABS
100.0000 mg | ORAL_TABLET | Freq: Every day | ORAL | Status: DC
  Administered 2013-11-15 – 2013-11-20 (×6): 100 mg via ORAL
  Filled 2013-11-14 (×7): qty 1

## 2013-11-14 MED ORDER — HYDRALAZINE HCL 20 MG/ML IJ SOLN
10.0000 mg | Freq: Four times a day (QID) | INTRAMUSCULAR | Status: DC | PRN
  Administered 2013-11-18: 10 mg via INTRAVENOUS
  Filled 2013-11-14: qty 1

## 2013-11-14 MED ORDER — CARBAMAZEPINE 200 MG PO TABS
400.0000 mg | ORAL_TABLET | Freq: Two times a day (BID) | ORAL | Status: DC
  Administered 2013-11-15 – 2013-11-20 (×10): 400 mg via ORAL
  Filled 2013-11-14 (×6): qty 2
  Filled 2013-11-14: qty 4
  Filled 2013-11-14 (×4): qty 2
  Filled 2013-11-14: qty 4
  Filled 2013-11-14: qty 2

## 2013-11-14 MED ORDER — LISINOPRIL 20 MG PO TABS
20.0000 mg | ORAL_TABLET | Freq: Every day | ORAL | Status: DC
  Administered 2013-11-15 – 2013-11-20 (×6): 20 mg via ORAL
  Filled 2013-11-14 (×7): qty 1

## 2013-11-14 MED ORDER — CHLORHEXIDINE GLUCONATE 0.12 % MT SOLN
15.0000 mL | Freq: Two times a day (BID) | OROMUCOSAL | Status: DC
  Administered 2013-11-14 – 2013-11-20 (×8): 15 mL via OROMUCOSAL
  Filled 2013-11-14 (×14): qty 15

## 2013-11-14 MED ORDER — ATORVASTATIN CALCIUM 40 MG PO TABS
40.0000 mg | ORAL_TABLET | Freq: Every day | ORAL | Status: DC
  Administered 2013-11-15 – 2013-11-19 (×5): 40 mg via ORAL
  Filled 2013-11-14 (×6): qty 1

## 2013-11-14 MED ORDER — LORAZEPAM 2 MG/ML IJ SOLN
1.0000 mg | Freq: Once | INTRAMUSCULAR | Status: AC
  Administered 2013-11-14: 1 mg via INTRAVENOUS
  Filled 2013-11-14: qty 1

## 2013-11-14 MED ORDER — SODIUM CHLORIDE 0.9 % IJ SOLN
3.0000 mL | Freq: Two times a day (BID) | INTRAMUSCULAR | Status: DC
  Administered 2013-11-16 – 2013-11-19 (×5): 3 mL via INTRAVENOUS

## 2013-11-14 MED ORDER — LEVETIRACETAM IN NACL 1000 MG/100ML IV SOLN
1000.0000 mg | Freq: Once | INTRAVENOUS | Status: AC
  Administered 2013-11-14: 1000 mg via INTRAVENOUS
  Filled 2013-11-14: qty 100

## 2013-11-14 NOTE — Progress Notes (Signed)
Notified dr York Spanielburiev pt is npo for failing swallow eval, stated he would address med Dorie RankByerly, Ediberto Sens Christine

## 2013-11-14 NOTE — ED Notes (Signed)
[phone]
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
## 2013-11-14 NOTE — Progress Notes (Signed)
[facility] PROGRESS NOTE  Lee [name] ION:[id number] DOB: 05/02/1950 DOA: 11/14/2013 PCP: Lee [name],Lee J, MD  Assessment/Plan: CXR: showed Mild patchy opacity in the lower half of the right lung, most likely in the right lower lobe and primarily in the perihilar regions. This is most compatible with pneumonia. -start IV atx, obtain blood cultures;   Lee [name], Lee [name]  [facility] Pager [phone]. If 7PM-7AM, please contact night-coverage at [email or web address], password [email or web address] 11/14/2013, 5:12 PM  LOS: 0 days
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
## 2013-11-14 NOTE — ED Notes (Signed)
Family at bedside. 

## 2013-11-14 NOTE — ED Notes (Signed)
Since 0730 am, pt. Had x 2 seizures; en route pt. Had a 2.5 minute seizure; pt. Aphasic upon arrival. Stroke 6 weeks ago. According to family, "increased. Confusion."

## 2013-11-15 ENCOUNTER — Inpatient Hospital Stay (HOSPITAL_COMMUNITY): Payer: Medicare Other

## 2013-11-15 DIAGNOSIS — G9340 Encephalopathy, unspecified: Secondary | ICD-10-CM

## 2013-11-15 DIAGNOSIS — J690 Pneumonitis due to inhalation of food and vomit: Secondary | ICD-10-CM

## 2013-11-15 DIAGNOSIS — G40301 Generalized idiopathic epilepsy and epileptic syndromes, not intractable, with status epilepticus: Secondary | ICD-10-CM

## 2013-11-15 DIAGNOSIS — J189 Pneumonia, unspecified organism: Secondary | ICD-10-CM | POA: Diagnosis present

## 2013-11-15 LAB — CBC
HCT: 39.7 % (ref 39.0–52.0)
Hemoglobin: 14.3 g/dL (ref 13.0–17.0)
MCH: 33.6 pg (ref 26.0–34.0)
MCHC: 36 g/dL (ref 30.0–36.0)
MCV: 93.4 fL (ref 78.0–100.0)
Platelets: 261 10*3/uL (ref 150–400)
RBC: 4.25 MIL/uL (ref 4.22–5.81)
RDW: 13.1 % (ref 11.5–15.5)
WBC: 15.9 10*3/uL — ABNORMAL HIGH (ref 4.0–10.5)

## 2013-11-15 MED ORDER — METRONIDAZOLE IN NACL 5-0.79 MG/ML-% IV SOLN
500.0000 mg | Freq: Three times a day (TID) | INTRAVENOUS | Status: DC
  Administered 2013-11-15 – 2013-11-16 (×3): 500 mg via INTRAVENOUS
  Filled 2013-11-15 (×5): qty 100

## 2013-11-15 NOTE — Progress Notes (Signed)
Chart reviewed.   [facility] PROGRESS NOTE  Lee [name] ZOX:[id number] DOB: Dec 05, 1950 DOA: 11/14/2013 PCP: [name], MD  Assessment/Plan:  Principal Problem:   Status epilepticus: no further reported seizures. MRI pending. If negative, start diet. meds per neuro. Active Problems:   Acute encephalopathy secondary to postictal: suspect at clinical baseline   Essential hypertension   Pneumonia, likely aspiration: pcn allergic. D/c vancomycin. Add flagyl to aztreonam   Code Status:  full Family Communication:  None available Disposition Plan:  Continue SDU today  Consultants:  neuro  Procedures:     Antibiotics:  Vanc 11/7-11/8  Aztreonam 11/7 -  Flagyl 11/8 -  HPI/Subjective: No complaints.   Objective: Filed Vitals:   11/15/13 0839  BP:   Pulse: 63  Temp: 98.5 F (36.9 C)  Resp: 16    Intake/Output Summary (Last 24 hours) at 11/15/13 0849 Last data filed at 11/15/13 0800  Gross per 24 hour  Intake   1500 ml  Output    500 ml  Net   1000 ml   Filed Weights   11/14/13 1628  Weight: 72.3 kg (159 lb 6.3 oz)    Exam:   General:  Alert. Appropriate.   Cardiovascular: RRR without MGR  Respiratory: CTA without WRR  Abdomen: s, nt, nd  Ext: no CCE  Neuro: speech hesitant. Answers questions not always appropriately. CN and strength intact  Basic Metabolic Panel:  Recent Labs Lab 11/14/13 1254  NA 139  K 3.6*  CL 102  CO2 21  GLUCOSE 138*  BUN 10  CREATININE 0.76  CALCIUM 9.0   Liver Function Tests:  Recent Labs Lab 11/14/13 1254  AST 24  ALT 28  ALKPHOS 124*  BILITOT 0.4  PROT 7.1  ALBUMIN 3.5   No results for input(s): LIPASE, AMYLASE in the last 168 hours. No results for input(s): AMMONIA in the last 168 hours. CBC:  Recent Labs Lab 11/14/13 1254 11/15/13 0325  WBC 18.3* 15.9*  NEUTROABS 15.7*  --   HGB 14.9 14.3  HCT 41.6 39.7  MCV 92.7 93.4  PLT 298 261   Cardiac Enzymes: No results for  input(s): CKTOTAL, CKMB, CKMBINDEX, TROPONINI in the last 168 hours. BNP (last 3 results) No results for input(s): PROBNP in the last 8760 hours. CBG:  Recent Labs Lab 11/14/13 1306  GLUCAP 131*    Recent Results (from the past 240 hour(s))  MRSA PCR Screening     Status: None   Collection Time: 11/14/13  5:17 PM  Result Value Ref Range Status   MRSA by PCR NEGATIVE NEGATIVE Final    Comment:        The GeneXpert MRSA Assay (FDA approved for NASAL specimens only), is one component of a comprehensive MRSA colonization surveillance program. It is not intended to diagnose MRSA infection nor to guide or monitor treatment for MRSA infections.      Studies: Ct Head (brain) Wo Contrast  11/14/2013   CLINICAL DATA:  63 year old male with history of prior stroke and 2 seizures this morning. Code stroke.  EXAM: CT HEAD WITHOUT CONTRAST  TECHNIQUE: Contiguous axial images were obtained from the base of the skull through the vertex without intravenous contrast.  COMPARISON:  Recent head CT and brain MRI 11/06/2013  FINDINGS: Negative for acute intracranial hemorrhage, acute infarction, mass, mass effect, hydrocephalus or midline shift. Gray-white differentiation is preserved throughout. Hypoattenuation in the right centrum semiovale consistent with previously identified focal infarct. Chronic microvascular ischemic white matter disease. Remote  lacunar infarcts in the right thalamus. No focal soft tissue or calvarial abnormality. Globes and orbits are symmetric bilaterally. Normal aeration of the mastoid air cells and paranasal sinuses. Atherosclerotic calcifications present within both carotid arteries.  IMPRESSION: 1. No acute intracranial abnormality. 2. No significant interval change in the appearance of the brain compared to recent prior imaging. These results will be called to the ordering clinician or representative by the Radiologist Assistant, and communication documented in the PACS or  zVision Dashboard.   Electronically Signed   By: Malachy Moan M.D.   On: 11/14/2013 13:16   Dg Chest Port 1 View  11/14/2013   CLINICAL DATA:  Leukocytosis.  EXAM: PORTABLE CHEST - 1 VIEW  COMPARISON:  11/06/2013.  FINDINGS: Normal sized heart. Mild patchy opacity in the lower half of the right lung. Clear left lung. Mild thoracic spine degenerative changes.  IMPRESSION: Mild patchy opacity in the lower half of the right lung, most likely in the right lower lobe and primarily in the perihilar regions. This is most compatible with pneumonia.   Electronically Signed   By: Gordan Payment M.D.   On: 11/14/2013 15:56    Scheduled Meds: . antiseptic oral rinse  7 mL Mouth Rinse q12n4p  . atorvastatin  40 mg Oral q1800  . aztreonam  1 g Intravenous Q8H  . carbamazepine  400 mg Oral BID  . chlorhexidine  15 mL Mouth Rinse BID  . clopidogrel  75 mg Oral Daily  . enoxaparin (LOVENOX) injection  40 mg Subcutaneous Q24H  . lacosamide (VIMPAT) IV  100 mg Intravenous Q12H  . levETIRAcetam  1,500 mg Intravenous Q12H  . lisinopril  20 mg Oral Daily  . sodium chloride  3 mL Intravenous Q12H  . thiamine  100 mg Oral Daily  . vancomycin  750 mg Intravenous Q8H   Continuous Infusions: . sodium chloride 100 mL/hr at 11/15/13 0700    Time spent: 35 minutes  Tierria Watson L  [facility] Pager [phone]. If 7PM-7AM, please contact night-coverage at [email or web address], password [facility] 11/15/2013, 8:49 AM  LOS: 1 day

## 2013-11-15 NOTE — Progress Notes (Signed)
Subjective: Patient has not experienced a recurrent seizure since arriving on the stepdown unit. He had no complaints this morning.  Objective: Current vital signs: BP 166/99 mmHg  Pulse 63  Temp(Src) 98.5 F (36.9 C) (Oral)  Resp 16  Ht 5\' 8"  (1.727 m)  Wt 72.3 kg (159 lb 6.3 oz)  BMI 24.24 kg/m2  SpO2 100%  Neurologic Exam: Patient was alert and confused. He was disoriented to time but oriented to place.  Speech was normal. Extraocular movements were full and conjugate. Visual fields were normal. No facial weakness was noted. Strength of extremities was normal throughout. Muscle tone was moderately increased throughout. He was also slightly tremulous. Tendon reflexes were 2+ and brisk. He had moderately active frontal release signs.  Medications: I have reviewed the patient's current medications.  Assessment/Plan: 63 year old man with a history of seizure disorder and strokes as well as history of alcohol abuse who presented with recurrent multiple focal and generalized seizure episodes. I suspect compliance is a significant problem. He has significant cognitive dysfunction, likely manifestations of dementia. His vitamin B-12 level was slightly high during his last admission about 10 days ago. TSH was normal and RPR was nonreactive. EEG showed moderate generalized nonspecific slowing of cerebral activity along with left frontotemporal epileptiform discharges.  I'm recommending attenuating Keppra at 1500 mg every 12 hours and increasing Vimpat to 150 mg every 12 hours. Tegretol can be resumed at 400 mg every 12 hours by mouth, now that he is awake and alert.  Because of his cognitive deficits will need supervision with taking all of his medications including anticonvulsant medications.  C.R. Roseanne RenoStewart, MD [facility] [phone]  11/15/2013  9:20 AM

## 2013-11-16 MED ORDER — LEVETIRACETAM 750 MG PO TABS
1500.0000 mg | ORAL_TABLET | Freq: Two times a day (BID) | ORAL | Status: DC
  Administered 2013-11-16 – 2013-11-20 (×8): 1500 mg via ORAL
  Filled 2013-11-16 (×8): qty 2
  Filled 2013-11-16: qty 6
  Filled 2013-11-16: qty 2

## 2013-11-16 MED ORDER — METRONIDAZOLE 500 MG PO TABS
500.0000 mg | ORAL_TABLET | Freq: Three times a day (TID) | ORAL | Status: DC
  Administered 2013-11-16 – 2013-11-20 (×12): 500 mg via ORAL
  Filled 2013-11-16 (×15): qty 1

## 2013-11-16 MED ORDER — LACOSAMIDE 50 MG PO TABS
150.0000 mg | ORAL_TABLET | Freq: Two times a day (BID) | ORAL | Status: DC
  Administered 2013-11-16 – 2013-11-17 (×3): 150 mg via ORAL
  Filled 2013-11-16 (×3): qty 3

## 2013-11-16 NOTE — Evaluation (Signed)
Physical Therapy Evaluation Patient Details Name: Lee [name] MRN: [id number] DOB: 09-Jan-1950 Today's Date: 11/16/2013   History of Present Illness  Patient is a 63 y/o male with PMH of HTN, PAD, Epilepsy, recurrent CVAs (residual L sided weakness) and probable vascular dementia presents with seizures. Patient was noted by family to have a blank stare and subsequently looked stiffening then generalized clonic activity for about 1 minute, then had another episode lasting 3-5 minutes, and one more episode in EMS. Just discharged 11/2 with CVA and seizures. CT head-(-). MRI (-). CXR- Mild patchy opacity in the lower half of the right lung, compatible with PNA.     Clinical Impression  Patient presents with functional limitations due to deficits listed in PT problem list (see below). Difficult to assess cognitive baseline as family not present and pt not able to provide necessary information however compared to previous therapy notes from ~1 week ago, pt is not functioning at cognitive or functional baseline. Resisting therapist when attempting to stand and complaining of pain in BLEs. Will need 2 person assist for mobility. Pt not safe to return home at this time. Pt would benefit from acute PT and St SNF to improve transfers, gait, balance and mobility so pt can maximize independence and ease burden of care prior to return home.    Follow Up Recommendations SNF;Supervision/Assistance - 24 hour    Equipment Recommendations  None recommended by PT    Recommendations for Other Services OT consult     Precautions / Restrictions Precautions Precautions: Fall Restrictions Weight Bearing Restrictions: No      Mobility  Bed Mobility               General bed mobility comments: Received sitting in chair upon PT arrival.   Transfers Overall transfer level: Needs assistance Equipment used: Rolling walker (2 wheeled) Transfers: Sit to/from Stand Sit to Stand: Max assist          General transfer comment: Max A to attempt to stand however pt only able to clear bottom, not able to extend hips or BLEs to stand upright. Pt resisting therapist posteriorly and trying to return to sitting position - grimacing and complaining of pain in Bil knees. VC for hand placement.  Ambulation/Gait                Stairs            Wheelchair Mobility    Modified Rankin (Stroke Patients Only)       Balance Overall balance assessment: Needs assistance Sitting-balance support: Feet supported;No upper extremity supported Sitting balance-Leahy Scale: Fair       Standing balance-Leahy Scale: Zero                               Pertinent Vitals/Pain Pain Assessment: Faces Faces Pain Scale: Hurts whole lot Pain Location: BLEs during mobility. Pain Descriptors / Indicators: Grimacing;Guarding Pain Intervention(s): Limited activity within patient's tolerance;Monitored during session;Repositioned    Home Living Family/patient expects to be discharged to:: Skilled nursing facility Living Arrangements: Spouse/significant other Available Help at Discharge: Family;Available PRN/intermittently Type of Home: House Home Access: Stairs to enter Entrance Stairs-Rails: Right Entrance Stairs-Number of Steps: 3 Home Layout: One level Home Equipment: Cane - single point Additional Comments: History is taken from prior admission ~1 week ago as pt not able to state any PLOF/home setup information during today's evaluation.    Prior Function Level of Independence:  Independent with assistive device(s)               Hand Dominance   Dominant Hand: Right    Extremity/Trunk Assessment   Upper Extremity Assessment: Defer to OT evaluation           Lower Extremity Assessment: Difficult to assess due to impaired cognition (Difficult to assess however pt's LEs seem strong as pt able to resist therapist upon standing with great force. Pain during knee  flexion/extension RLE.)      Cervical / Trunk Assessment: Normal  Communication   Communication: No difficulties  Cognition Arousal/Alertness: Awake/alert Behavior During Therapy: WFL for tasks assessed/performed Overall Cognitive Status: No family/caregiver present to determine baseline cognitive functioning (Pt following 1 step commands ~50% of time with visual cues. Requires repetition and cues. A&O x0. Not able to state name. "April" for year "8")                      General Comments General comments (skin integrity, edema, etc.): Pt seems very confused. Not able to determine baseline level of functioning as no family members present. Pt came from home and seemed ambulatory at discharge ~1 week ago.    Exercises General Exercises - Lower Extremity Long Arc Quad: Both;10 reps;Seated Hip Flexion/Marching: Both;10 reps;Seated      Assessment/Plan    PT Assessment Patient needs continued PT services  PT Diagnosis Difficulty walking;Acute pain   PT Problem List Decreased strength;Pain;Decreased cognition;Decreased activity tolerance;Decreased balance;Decreased safety awareness;Decreased mobility  PT Treatment Interventions Balance training;DME instruction;Gait training;Neuromuscular re-education;Cognitive remediation;Patient/family education;Functional mobility training;Therapeutic activities   PT Goals (Current goals can be found in the Care Plan section) Acute Rehab PT Goals Patient Stated Goal: none stated PT Goal Formulation: Patient unable to participate in goal setting Time For Goal Achievement: 11/30/13 Potential to Achieve Goals: Fair    Frequency Min 3X/week   Barriers to discharge Decreased caregiver support Per MD notes, pt is home alone during the day.    Co-evaluation               End of Session Equipment Utilized During Treatment: Gait belt Activity Tolerance: Patient limited by pain;Other (comment) (and resisting therapist.) Patient left: in  chair;with call bell/phone within reach;with chair alarm set Nurse Communication: Mobility status         Time: 1610-9604 PT Time Calculation (min): 15 min   Charges:   PT Evaluation $Initial PT Evaluation Tier I: 1 Procedure PT Treatments $Therapeutic Activity: 8-22 mins   PT G CodesAlvie Heidelberg A 11/16/2013, 3:49 PM  Alvie Heidelberg, PT, DPT [phone]

## 2013-11-16 NOTE — Progress Notes (Signed)
[facility] PROGRESS NOTE  Lee [name] KGM:[id number] DOB: 05/03/1950 DOA: 11/14/2013 PCP: [name], MD  Summary 63 y.o. male with PMH of HTN, PAD, Epilepsy, recurrent CVAs (residual L sided weakness), probable vascular dementia presented with seizures. Patient was noted by family to have a blank stare and subsequently looked stiffening then generalized clonic activity for about 1 minute, then had another episode lasting 3-5 minutes, and one more episode in EMS, Pt was given IV ativan, started on keppra, vimpat IV; currently mild postictal but follows commands; just discharged 11/2 with CVA and seizures Assessment/Plan:  Principal Problem:   Status epilepticus: no further reported seizures. MRI negative for acute CVA. Saline lock, change meds to po. Transfer to [facility]. Patient likely has baseline dementia. Wife works during the day. She assists with meds, but patient refuses meds several times per week.  I am concerned about patient being home alone when wife works. Will get PT/OT for disposition recs.  Wife trying to arrange work from home, but may take several weeks. Active Problems:   Acute encephalopathy secondary to postictal: suspect at clinical baseline   Essential hypertension   Pneumonia, likely aspiration: pcn allergic. Continue aztreonam. Add flagyl. Would avoid fluoroquinolones due to seizures.   Code Status:  full Family Communication:  Wife at bedside Disposition Plan:  Transfer to [facility]. Likely discharge tomorrow after safest disposition clarified.  Consultants:  neuro  Procedures:     Antibiotics:  Vanc 11/7-11/8  Aztreonam 11/7 -  Flagyl 11/8 -  HPI/Subjective: No complaints. Per RN, tried to pull at IV tubing  Objective: Filed Vitals:   11/16/13 0700  BP:   Pulse:   Temp: 98.1 F (36.7 C)  Resp:     Intake/Output Summary (Last 24 hours) at 11/16/13 0902 Last data filed at 11/16/13 0300  Gross per 24 hour  Intake   1965 ml   Output      0 ml  Net   1965 ml   Filed Weights   11/14/13 1628  Weight: 72.3 kg (159 lb 6.3 oz)    Exam:   General:  Alert. Disoriented to time and situation. Does not remember my name. In mittens  Cardiovascular: RRR without MGR  Respiratory: CTA without WRR  Abdomen: s, nt, nd  Ext: no CCE  Neuro: speech hesitant. Answers questions not always appropriately. CN and strength intact  Basic Metabolic Panel:  Recent Labs Lab 11/14/13 1254  NA 139  K 3.6*  CL 102  CO2 21  GLUCOSE 138*  BUN 10  CREATININE 0.76  CALCIUM 9.0   Liver Function Tests:  Recent Labs Lab 11/14/13 1254  AST 24  ALT 28  ALKPHOS 124*  BILITOT 0.4  PROT 7.1  ALBUMIN 3.5   No results for input(s): LIPASE, AMYLASE in the last 168 hours. No results for input(s): AMMONIA in the last 168 hours. CBC:  Recent Labs Lab 11/14/13 1254 11/15/13 0325  WBC 18.3* 15.9*  NEUTROABS 15.7*  --   HGB 14.9 14.3  HCT 41.6 39.7  MCV 92.7 93.4  PLT 298 261   Cardiac Enzymes: No results for input(s): CKTOTAL, CKMB, CKMBINDEX, TROPONINI in the last 168 hours. BNP (last 3 results) No results for input(s): PROBNP in the last 8760 hours. CBG:  Recent Labs Lab 11/14/13 1306  GLUCAP 131*    Recent Results (from the past 240 hour(s))  MRSA PCR Screening     Status: None   Collection Time: 11/14/13  5:17 PM  Result Value Ref  Range Status   MRSA by PCR NEGATIVE NEGATIVE Final    Comment:        The GeneXpert MRSA Assay (FDA approved for NASAL specimens only), is one component of a comprehensive MRSA colonization surveillance program. It is not intended to diagnose MRSA infection nor to guide or monitor treatment for MRSA infections.   Culture, blood (routine x 2)     Status: None (Preliminary result)   Collection Time: 11/14/13  6:49 PM  Result Value Ref Range Status   Specimen Description BLOOD RIGHT HAND  Final   Special Requests BOTTLES DRAWN AEROBIC AND ANAEROBIC 5CC EACH  Final    Culture  Setup Time   Final    11/15/2013 00:57 Performed at [facility]    Culture   Final           BLOOD CULTURE RECEIVED NO GROWTH TO DATE CULTURE WILL BE HELD FOR 5 DAYS BEFORE ISSUING A FINAL NEGATIVE REPORT Performed at [facility]    Report Status PENDING  Incomplete  Culture, blood (routine x 2)     Status: None (Preliminary result)   Collection Time: 11/14/13  7:02 PM  Result Value Ref Range Status   Specimen Description BLOOD RIGHT HAND  Final   Special Requests BOTTLES DRAWN AEROBIC ONLY 5CC  Final   Culture  Setup Time   Final    11/15/2013 00:58 Performed at [facility]    Culture   Final           BLOOD CULTURE RECEIVED NO GROWTH TO DATE CULTURE WILL BE HELD FOR 5 DAYS BEFORE ISSUING A FINAL NEGATIVE REPORT Performed at [facility]    Report Status PENDING  Incomplete     Studies: Ct Head (brain) Wo Contrast  11/14/2013   CLINICAL DATA:  63 year old male with history of prior stroke and 2 seizures this morning. Code stroke.  EXAM: CT HEAD WITHOUT CONTRAST  TECHNIQUE: Contiguous axial images were obtained from the base of the skull through the vertex without intravenous contrast.  COMPARISON:  Recent head CT and brain MRI 11/06/2013  FINDINGS: Negative for acute intracranial hemorrhage, acute infarction, mass, mass effect, hydrocephalus or midline shift. Gray-white differentiation is preserved throughout. Hypoattenuation in the right centrum semiovale consistent with previously identified focal infarct. Chronic microvascular ischemic white matter disease. Remote lacunar infarcts in the right thalamus. No focal soft tissue or calvarial abnormality. Globes and orbits are symmetric bilaterally. Normal aeration of the mastoid air cells and paranasal sinuses. Atherosclerotic calcifications present within both carotid arteries.  IMPRESSION: 1. No acute intracranial abnormality. 2. No significant interval change in the appearance of the brain  compared to recent prior imaging. These results will be called to the ordering clinician or representative by the Radiologist Assistant, and communication documented in the PACS or zVision Dashboard.   Electronically Signed   By: Malachy Moan M.D.   On: 11/14/2013 13:16   Mr Brain Wo Contrast  11/15/2013   ADDENDUM REPORT: 11/15/2013 13:12  ADDENDUM: Abnormal and likely obstructed flow in the left vertebral artery is unchanged since the September study.   Electronically Signed   By: Gennette Pac M.D.   On: 11/15/2013 13:12   11/15/2013   CLINICAL DATA:  Recurrent seizure disorder. Recent admission for CVA.  EXAM: MRI HEAD WITHOUT CONTRAST  TECHNIQUE: Multiplanar, multiecho pulse sequences of the brain and surrounding structures were obtained without intravenous contrast.  COMPARISON:  CT head 11/14/2013 and MRI brain 11/06/2013.  FINDINGS:  The diffusion-weighted images again shape the area of subacute infarction within the right coronal radiata. No new areas of infarct are evident.  Remote infarcts are again seen within the cerebellum. The remote lacunar infarcts of the brainstem and left midbrain are stable. Atrophy and periventricular white matter changes bilaterally are stable. No acute hemorrhage or mass lesion is evident. The ventricles are proportionate to the degree of atrophy.  Abnormal signal is evident within the left vertebral artery. Flow is present in the remainder of the intracranial circulation. The globes and orbits are intact. Minimal posterior left ethmoid sinus disease is present. The remaining paranasal sinuses and the mastoid air cells are clear.  Dedicated imaging of the temporal lobes demonstrates asymmetric decreased size of the right hippocampus. There is enlargement of the right temporal tip.  IMPRESSION: 1. Expected evolution of white matter infarct within the right corona radiata. 2. Multiple remote lacunar infarcts in extensive white matter disease. This likely reflects the  sequela of chronic microvascular ischemia. 3. Asymmetric atrophy of the right hippocampal structure. This may be the source of seizures or related to chronic seizure activity emanating from the right temporal lobe.  Electronically Signed: By: Gennette Pac M.D. On: 11/15/2013 13:02   Dg Chest Port 1 View  11/14/2013   CLINICAL DATA:  Leukocytosis.  EXAM: PORTABLE CHEST - 1 VIEW  COMPARISON:  11/06/2013.  FINDINGS: Normal sized heart. Mild patchy opacity in the lower half of the right lung. Clear left lung. Mild thoracic spine degenerative changes.  IMPRESSION: Mild patchy opacity in the lower half of the right lung, most likely in the right lower lobe and primarily in the perihilar regions. This is most compatible with pneumonia.   Electronically Signed   By: Gordan Payment M.D.   On: 11/14/2013 15:56    Scheduled Meds: . antiseptic oral rinse  7 mL Mouth Rinse q12n4p  . atorvastatin  40 mg Oral q1800  . aztreonam  1 g Intravenous Q8H  . carbamazepine  400 mg Oral BID  . chlorhexidine  15 mL Mouth Rinse BID  . clopidogrel  75 mg Oral Daily  . enoxaparin (LOVENOX) injection  40 mg Subcutaneous Q24H  . lacosamide  150 mg Oral BID  . levETIRAcetam  1,500 mg Oral BID  . lisinopril  20 mg Oral Daily  . metroNIDAZOLE  500 mg Oral 3 times per day  . sodium chloride  3 mL Intravenous Q12H  . thiamine  100 mg Oral Daily   Continuous Infusions:    Time spent: 35 minutes  Cayle Thunder L  [facility] Pager [phone]. If 7PM-7AM, please contact night-coverage at [email or web address], password [facility] 11/16/2013, 9:02 AM  LOS: 2 days

## 2013-11-16 NOTE — Progress Notes (Signed)
CARE MANAGEMENT NOTE 11/16/2013  Patient:  Lee [name],Lee [name]   Account Number:  [id number]  Date Initiated:  11/16/2013  Documentation initiated by:  Jiles CrockerHANDLER,Milah Recht  Subjective/Objective Assessment:   ADMITTED WITH SEIZURE     Action/Plan:   PATIENT RECENTLY DISCHARGED HOME IS ACTIVE WITH [facility] AS PRIOR TO ADMISSION;   Anticipated DC Date:  11/19/2013   Anticipated DC Plan:  HOME W HOME HEALTH SERVICES     DC Planning Services  CM consult          Status of service:  In process, will continue to follow Medicare Important Message given?   (If response is "NO", the following Medicare IM given date fields will be blank)  Per UR Regulation:  Reviewed for med. necessity/level of care/duration of stay  Comments:  11/9/2015Abelino Derrick- B Boleslaw Borghi RN,BSN,MHA [phone]

## 2013-11-16 NOTE — Progress Notes (Signed)
Subjective: Patient remains confused.  Has had no further seizures.  Seems to be tolerating anticonvulsant therapy.    Objective: Current vital signs: BP 173/81 mmHg  Pulse 77  Temp(Src) 98.1 F (36.7 C) (Oral)  Resp 26  Ht 5\' 8"  (1.727 m)  Wt 72.3 kg (159 lb 6.3 oz)  BMI 24.24 kg/m2  SpO2 93% Vital signs in last 24 hours: Temp:  [98 F (36.7 C)-98.6 F (37 C)] 98.1 F (36.7 C) (11/09 0700) Pulse Rate:  [67-80] 77 (11/09 0309) Resp:  [16-26] 26 (11/09 0309) BP: (119-173)/(71-84) 173/81 mmHg (11/09 0309) SpO2:  [93 %-98 %] 93 % (11/09 0309)  Intake/Output from previous day: 11/08 0701 - 11/09 0700 In: 2065 [P.O.:360; I.V.:1105; IV Piggyback:600] Out: 150 [Urine:150] Intake/Output this shift:   Nutritional status: Diet Heart  Neurologic Exam: Mental Status: Alert and awake.  Slightly agitated but consolable.  Keeps saying that he has to get up and use the bathroom despite being repeatedly told that he has a foley in place.  Will follow some simple commands.  Speech fluent. Cranial Nerves: II: Discs flat bilaterally; Visual fields grossly normal, pupils equal, round, reactive to light and accommodation III,IV, VI: ptosis not present, extra-ocular motions intact bilaterally V,VII: smile symmetric, facial light touch sensation normal bilaterally VIII: hearing normal bilaterally IX,X: gag reflex present XI: bilateral shoulder shrug XII: midline tongue extension Motor: Right : Upper extremity   5/5    Left:     Upper extremity   5/5  Lower extremity   5/5     Lower extremity   5/5 Tone and bulk:normal tone throughout; no atrophy noted Sensory: Pinprick and light touch intact throughout, bilaterally Deep Tendon Reflexes: 2+ and symmetric throughout   Lab Results: Basic Metabolic Panel:  Recent Labs Lab 11/14/13 1254  NA 139  K 3.6*  CL 102  CO2 21  GLUCOSE 138*  BUN 10  CREATININE 0.76  CALCIUM 9.0    Liver Function Tests:  Recent Labs Lab 11/14/13 1254   AST 24  ALT 28  ALKPHOS 124*  BILITOT 0.4  PROT 7.1  ALBUMIN 3.5   No results for input(s): LIPASE, AMYLASE in the last 168 hours. No results for input(s): AMMONIA in the last 168 hours.  CBC:  Recent Labs Lab 11/14/13 1254 11/15/13 0325  WBC 18.3* 15.9*  NEUTROABS 15.7*  --   HGB 14.9 14.3  HCT 41.6 39.7  MCV 92.7 93.4  PLT 298 261    Cardiac Enzymes: No results for input(s): CKTOTAL, CKMB, CKMBINDEX, TROPONINI in the last 168 hours.  Lipid Panel: No results for input(s): CHOL, TRIG, HDL, CHOLHDL, VLDL, LDLCALC in the last 168 hours.  CBG:  Recent Labs Lab 11/14/13 1306  GLUCAP 131*    Microbiology: Results for orders placed or performed during the hospital encounter of 11/14/13  MRSA PCR Screening     Status: None   Collection Time: 11/14/13  5:17 PM  Result Value Ref Range Status   MRSA by PCR NEGATIVE NEGATIVE Final    Comment:        The GeneXpert MRSA Assay (FDA approved for NASAL specimens only), is one component of a comprehensive MRSA colonization surveillance program. It is not intended to diagnose MRSA infection nor to guide or monitor treatment for MRSA infections.   Culture, blood (routine x 2)     Status: None (Preliminary result)   Collection Time: 11/14/13  6:49 PM  Result Value Ref Range Status   Specimen Description BLOOD RIGHT  HAND  Final   Special Requests BOTTLES DRAWN AEROBIC AND ANAEROBIC 5CC EACH  Final   Culture  Setup Time   Final    11/15/2013 00:57 [facility]    Culture   Final           BLOOD CULTURE RECEIVED NO GROWTH TO DATE CULTURE WILL BE HELD FOR 5 DAYS BEFORE ISSUING A FINAL NEGATIVE REPORT [facility]    Report Status PENDING  Incomplete  Culture, blood (routine x 2)     Status: None (Preliminary result)   Collection Time: 11/14/13  7:02 PM  Result Value Ref Range Status   Specimen Description BLOOD RIGHT HAND  Final   Special Requests BOTTLES DRAWN AEROBIC ONLY  5CC  Final   Culture  Setup Time   Final    11/15/2013 00:58 [facility]formed at [facility]    Culture   Final           BLOOD CULTURE RECEIVED NO GROWTH TO DATE CULTURE WILL BE HELD FOR 5 DAYS BEFORE ISSUING A FINAL NEGATIVE REPORT Performed at [facility]    Report Status PENDING  Incomplete    Coagulation Studies:  Recent Labs  11/14/13 1254  LABPROT 15.1  INR 1.18    Imaging: Ct Head (brain) Wo Contrast  11/14/2013   CLINICAL DATA:  63 year old male with history of prior stroke and 2 seizures this morning. Code stroke.  EXAM: CT HEAD WITHOUT CONTRAST  TECHNIQUE: Contiguous axial images were obtained from the base of the skull through the vertex without intravenous contrast.  COMPARISON:  Recent head CT and brain MRI 11/06/2013  FINDINGS: Negative for acute intracranial hemorrhage, acute infarction, mass, mass effect, hydrocephalus or midline shift. Gray-white differentiation is preserved throughout. Hypoattenuation in the right centrum semiovale consistent with previously identified focal infarct. Chronic microvascular ischemic white matter disease. Remote lacunar infarcts in the right thalamus. No focal soft tissue or calvarial abnormality. Globes and orbits are symmetric bilaterally. Normal aeration of the mastoid air cells and paranasal sinuses. Atherosclerotic calcifications present within both carotid arteries.  IMPRESSION: 1. No acute intracranial abnormality. 2. No significant interval change in the appearance of the brain compared to recent prior imaging. These results will be called to the ordering clinician or representative by the Radiologist Assistant, and communication documented in the PACS or zVision Dashboard.   Electronically Signed   By: Malachy MoanHeath  McCullough M.D.   On: 11/14/2013 13:16   Mr Brain Wo Contrast  11/15/2013   ADDENDUM REPORT: 11/15/2013 13:12  ADDENDUM: Abnormal and likely obstructed flow in the left vertebral artery is unchanged since the  September study.   Electronically Signed   By: Gennette Pachris  Mattern M.D.   On: 11/15/2013 13:12   11/15/2013   CLINICAL DATA:  Recurrent seizure disorder. Recent admission for CVA.  EXAM: MRI HEAD WITHOUT CONTRAST  TECHNIQUE: Multiplanar, multiecho pulse sequences of the brain and surrounding structures were obtained without intravenous contrast.  COMPARISON:  CT head 11/14/2013 and MRI brain 11/06/2013.  FINDINGS: The diffusion-weighted images again shape the area of subacute infarction within the right coronal radiata. No new areas of infarct are evident.  Remote infarcts are again seen within the cerebellum. The remote lacunar infarcts of the brainstem and left midbrain are stable. Atrophy and periventricular white matter changes bilaterally are stable. No acute hemorrhage or mass lesion is evident. The ventricles are proportionate to the degree of atrophy.  Abnormal signal is evident within the left  vertebral artery. Flow is present in the remainder of the intracranial circulation. The globes and orbits are intact. Minimal posterior left ethmoid sinus disease is present. The remaining paranasal sinuses and the mastoid air cells are clear.  Dedicated imaging of the temporal lobes demonstrates asymmetric decreased size of the right hippocampus. There is enlargement of the right temporal tip.  IMPRESSION: 1. Expected evolution of white matter infarct within the right corona radiata. 2. Multiple remote lacunar infarcts in extensive white matter disease. This likely reflects the sequela of chronic microvascular ischemia. 3. Asymmetric atrophy of the right hippocampal structure. This may be the source of seizures or related to chronic seizure activity emanating from the right temporal lobe.  Electronically Signed: By: Gennette Pac M.D. On: 11/15/2013 13:02   Dg Chest Port 1 View  11/14/2013   CLINICAL DATA:  Leukocytosis.  EXAM: PORTABLE CHEST - 1 VIEW  COMPARISON:  11/06/2013.  FINDINGS: Normal sized heart. Mild  patchy opacity in the lower half of the right lung. Clear left lung. Mild thoracic spine degenerative changes.  IMPRESSION: Mild patchy opacity in the lower half of the right lung, most likely in the right lower lobe and primarily in the perihilar regions. This is most compatible with pneumonia.   Electronically Signed   By: Gordan Payment M.D.   On: 11/14/2013 15:56    Medications:  I have reviewed the patient's current medications. Scheduled: . antiseptic oral rinse  7 mL Mouth Rinse q12n4p  . atorvastatin  40 mg Oral q1800  . aztreonam  1 g Intravenous Q8H  . carbamazepine  400 mg Oral BID  . chlorhexidine  15 mL Mouth Rinse BID  . clopidogrel  75 mg Oral Daily  . enoxaparin (LOVENOX) injection  40 mg Subcutaneous Q24H  . lacosamide  150 mg Oral BID  . levETIRAcetam  1,500 mg Oral BID  . lisinopril  20 mg Oral Daily  . metroNIDAZOLE  500 mg Oral 3 times per day  . sodium chloride  3 mL Intravenous Q12H  . thiamine  100 mg Oral Daily    Assessment/Plan: Patient has remained seizure free. MRI of the brain reviewed and shows evidence of chronic lacunar disease and a right corona radiata infarct.  No acute changes noted.  Dementia in this patient is likely multifactorial with etiology being vascular and alcohol related.  Remaining work up has been unremarkable.    Recommendations: 1.  Continue current AED therapy.  2.  Will continue to follow with you.     LOS: 2 days   Thana Farr, MD [facility] [phone] 11/16/2013  9:49 AM

## 2013-11-16 NOTE — Progress Notes (Signed)
Pt report called to 4N RN- VSS- meds current to time. Pt to transfer per MD order. All belongs packed and sent to new room with patient. CMT and elink notified.

## 2013-11-17 ENCOUNTER — Inpatient Hospital Stay (HOSPITAL_COMMUNITY): Payer: Medicare Other

## 2013-11-17 LAB — BASIC METABOLIC PANEL
Anion gap: 16 — ABNORMAL HIGH (ref 5–15)
BUN: 5 mg/dL — ABNORMAL LOW (ref 6–23)
CALCIUM: 8.6 mg/dL (ref 8.4–10.5)
CO2: 21 mEq/L (ref 19–32)
CREATININE: 0.63 mg/dL (ref 0.50–1.35)
Chloride: 98 mEq/L (ref 96–112)
GLUCOSE: 152 mg/dL — AB (ref 70–99)
POTASSIUM: 3.4 meq/L — AB (ref 3.7–5.3)
Sodium: 135 mEq/L — ABNORMAL LOW (ref 137–147)

## 2013-11-17 LAB — SYNOVIAL CELL COUNT + DIFF, W/ CRYSTALS
EOSINOPHILS-SYNOVIAL: NONE SEEN % (ref 0–1)
Lymphocytes-Synovial Fld: 3 % (ref 0–20)
Monocyte-Macrophage-Synovial Fluid: 20 % — ABNORMAL LOW (ref 50–90)
NEUTROPHIL, SYNOVIAL: 77 % — AB (ref 0–25)
WBC, SYNOVIAL: 37728 /mm3 — AB (ref 0–200)

## 2013-11-17 LAB — URIC ACID: Uric Acid, Serum: 2.2 mg/dL — ABNORMAL LOW (ref 4.0–7.8)

## 2013-11-17 LAB — CBC
HCT: 38.3 % — ABNORMAL LOW (ref 39.0–52.0)
Hemoglobin: 13.6 g/dL (ref 13.0–17.0)
MCH: 32.9 pg (ref 26.0–34.0)
MCHC: 35.5 g/dL (ref 30.0–36.0)
MCV: 92.7 fL (ref 78.0–100.0)
PLATELETS: 310 10*3/uL (ref 150–400)
RBC: 4.13 MIL/uL — AB (ref 4.22–5.81)
RDW: 12.9 % (ref 11.5–15.5)
WBC: 13 10*3/uL — ABNORMAL HIGH (ref 4.0–10.5)

## 2013-11-17 MED ORDER — LACOSAMIDE 50 MG PO TABS
200.0000 mg | ORAL_TABLET | Freq: Two times a day (BID) | ORAL | Status: DC
  Administered 2013-11-18 – 2013-11-20 (×5): 200 mg via ORAL
  Filled 2013-11-17 (×6): qty 4

## 2013-11-17 MED ORDER — SODIUM CHLORIDE 0.9 % IV SOLN
50.0000 mg | Freq: Once | INTRAVENOUS | Status: AC
  Administered 2013-11-17: 50 mg via INTRAVENOUS
  Filled 2013-11-17: qty 5

## 2013-11-17 MED ORDER — VANCOMYCIN HCL IN DEXTROSE 1-5 GM/200ML-% IV SOLN
1000.0000 mg | INTRAVENOUS | Status: AC
  Administered 2013-11-17: 1000 mg via INTRAVENOUS
  Filled 2013-11-17: qty 200

## 2013-11-17 MED ORDER — POTASSIUM CHLORIDE IN NACL 40-0.9 MEQ/L-% IV SOLN
INTRAVENOUS | Status: DC
  Administered 2013-11-17: 50 mL/h via INTRAVENOUS
  Filled 2013-11-17 (×2): qty 1000

## 2013-11-17 MED ORDER — ACETAMINOPHEN 500 MG PO TABS
500.0000 mg | ORAL_TABLET | Freq: Four times a day (QID) | ORAL | Status: DC | PRN
  Administered 2013-11-17 – 2013-11-18 (×3): 500 mg via ORAL
  Filled 2013-11-17 (×3): qty 1

## 2013-11-17 MED ORDER — VANCOMYCIN HCL IN DEXTROSE 750-5 MG/150ML-% IV SOLN
750.0000 mg | Freq: Three times a day (TID) | INTRAVENOUS | Status: DC
  Administered 2013-11-17 – 2013-11-19 (×5): 750 mg via INTRAVENOUS
  Filled 2013-11-17 (×9): qty 150

## 2013-11-17 NOTE — Progress Notes (Signed)
PT Cancellation Note  Patient Details Name: Lee [name] MRN: [id number] DOB: 12/10/1950   Cancelled Treatment:    Reason Eval/Treat Not Completed: Patient declined, no reason specified Attempted to assist patient to EOB, however pt resisting therapist and yelping in pain with any movement. Left knee is swollen with warmth and erythema. RN notified of concern about knee. Pt not willing to participate in therapy today, limited by pain. Will follow up next available time.   [name], Anner Baity A 11/17/2013, 12:26 PM Alvie HeidelbergShauna Folan, PT, DPT [phone]

## 2013-11-17 NOTE — Evaluation (Signed)
Occupational Therapy Evaluation Patient Details Name: Lee [name] MRN: [id number] DOB: 1950-02-19 Today's Date: 11/17/2013    History of Present Illness Patient is a 63 y/o male with PMH of HTN, PAD, Epilepsy, recurrent CVAs (residual L sided weakness) and probable vascular dementia presents with seizures. Patient was noted by family to have a blank stare and subsequently looked stiffening then generalized clonic activity for about 1 minute, then had another episode lasting 3-5 minutes, and one more episode in EMS. Just discharged 11/2 with CVA and seizures. CT head-(-). MRI (-). CXR- Mild patchy opacity in the lower half of the right lung, compatible with PNA.    Clinical Impression   PT admitted with seizure and incr blank stare. Pt with CVA workup underway with CT (-) and MRI (-). Pt currently with functional limitiations due to the deficits listed below (see OT problem list). PTA pt was at home walking with DME and Adls supervision level. Pt will benefit from skilled OT to increase their independence and safety with adls and balance to allow discharge SNF. Pt calling out with any tactile input to knee and question gout???. MD please address knee pain. Pt very limited by LE pain and full body extensor tone. Ot to follow acutely for adl retraining with cognitive recovery.      Follow Up Recommendations  SNF;Supervision/Assistance - 24 hour    Equipment Recommendations  Wheelchair (measurements OT);Wheelchair cushion (measurements OT);Hospital bed;Other (comment) (hoyer)    Recommendations for Other Services       Precautions / Restrictions Precautions Precautions: Fall Precaution Comments: full body extension      Mobility Bed Mobility Overal bed mobility: Needs Assistance;+2 for physical assistance;+ 2 for safety/equipment Bed Mobility: Supine to Sit;Sit to Supine     Supine to sit: Total assist;+2 for physical assistance Sit to supine: +2 for physical  assistance;Total assist   General bed mobility comments: Pt required pad and EOB elevated to pivot on buttock with pad to swing into sitting position. pt calling out with BIL LE off EOB. Pt requires support due to full extension. pt required total +2 to decr extensor tone and promote hip flexion  Transfers                 General transfer comment: unsafe to attempt today    Balance Overall balance assessment: Needs assistance Sitting-balance support: Bilateral upper extremity supported;Feet supported Sitting balance-Leahy Scale: Zero                                      ADL Overall ADL's : Needs assistance/impaired     Grooming: Total assistance   Upper Body Bathing: Total assistance   Lower Body Bathing: Total assistance                         General ADL Comments: pt unsafe to transfer or attempt sit<>Stand. pt with full body extension and reports severe pain. pt with pain with any input to bil knees. Question gout? knee L is larger than R knee. Pt with slight incr temperature tactile compared to the rest of the leg     Vision                 Additional Comments: difficult to assess due to cognitive deficits   Perception     Praxis      Pertinent Vitals/Pain Pain Assessment: Faces  Faces Pain Scale: Hurts worst Pain Location: facial grimance with tactile input of knee Pain Descriptors / Indicators: Grimacing Pain Intervention(s): Repositioned;Monitored during session     Hand Dominance Right   Extremity/Trunk Assessment Upper Extremity Assessment Upper Extremity Assessment: Generalized weakness   Lower Extremity Assessment Lower Extremity Assessment: Defer to PT evaluation;RLE deficits/detail;LLE deficits/detail RLE Deficits / Details: full extension entire session with foot in plantar flexion LLE Deficits / Details: knee flexion to 160 degrees but other wise full extension 180 . Pt not tolerating knee flexion   Cervical  / Trunk Assessment Cervical / Trunk Assessment: Other exceptions Cervical / Trunk Exceptions: full extension of core. Pt requires facilitation for hip flexion and shoulder scapula depression   Communication Communication Communication: No difficulties   Cognition Arousal/Alertness: Awake/alert Behavior During Therapy: WFL for tasks assessed/performed Overall Cognitive Status: History of cognitive impairments - at baseline Area of Impairment: Memory               General Comments: pt unable to state name or location of pain. pt states "oh wait oh"   General Comments       Exercises       Shoulder Instructions      Home Living Family/patient expects to be discharged to:: Skilled nursing facility                                        Prior Functioning/Environment Level of Independence: Independent with assistive device(s)        Comments: lives at home with wife    OT Diagnosis: Generalized weakness;Acute pain   OT Problem List: Decreased activity tolerance;Impaired balance (sitting and/or standing);Decreased cognition;Decreased safety awareness;Decreased knowledge of use of DME or AE;Pain;Decreased coordination   OT Treatment/Interventions: Self-care/ADL training;Therapeutic exercise;Neuromuscular education;DME and/or AE instruction;Therapeutic activities;Cognitive remediation/compensation;Patient/family education;Balance training    OT Goals(Current goals can be found in the care plan section) Acute Rehab OT Goals Patient Stated Goal: none stated OT Goal Formulation: Patient unable to participate in goal setting Time For Goal Achievement: 12/01/13 Potential to Achieve Goals: Fair  OT Frequency: Min 2X/week   Barriers to D/C:            Co-evaluation              End of Session Nurse Communication: Mobility status;Precautions  Activity Tolerance: Patient limited by pain Patient left: in bed;with call bell/phone within reach;with bed  alarm set   Time: 1131-1147 OT Time Calculation (min): 16 min Charges:  OT General Charges $OT Visit: 1 Procedure OT Evaluation $Initial OT Evaluation Tier I: 1 Procedure OT Treatments $Self Care/Home Management : 8-22 mins G-Codes:    Boone Master B 12-04-13, 2:46 PM  Pager: [phone]
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
## 2013-11-17 NOTE — Progress Notes (Addendum)
Patient Demographics  Lee [name], is a 63 y.o. male, DOB - 1950-10-05, ONG:[id number]  Admit date - 11/14/2013   Admitting Physician [name], MD  Outpatient Primary MD for the patient is [name], MD  LOS - 3   Chief Complaint  Patient presents with  . Code Stroke  . Seizures        Subjective:   [name] unreliable historian ++ confused No headache, No chest pain, No abdominal pain - No Nausea, No new weakness tingling or numbness, No Cough - SOB.  ? L knee pain  Assessment & Plan    1.  Status epilepticus, history of multiple strokes and advanced vascular dementia. Also alcohol abuse in the past.  EEG was positive for ongoing seizures, he was at home taking Keppra and Tegretol, dose has been adjusted by neurology and Vimpat has been added. He is still quite confused and I question ongoing seizure activity. Neurology to follow.    2. Aspiration pneumonia. HCAP. Likely due to #1 above, will add feeding assistance and aspiration precautions. Requested speech to evaluate as soon as possible. Repeat one view portable chest x-ray. Continue present antibiotics + add Vanco    3. Generalized weakness and severe deconditioning. Will have PT OT and speech evaluate, social work to see he might require placement.    4. Essential hypertension. On ACE inhibitor and stable.    5. History of multiple CVAs. Chronic left-sided weakness, On Plavix, statin for secondary prevention. Neuro following. No acute issues on MRI.    6.L knee Pain and effusion - Xray, uric acid, B.cultuures,  Dr Lajoyce Corners called will need joint tap. ABX as in #2, add Vanco      Code Status: Full  Family Communication: none present  Disposition Plan: TBD   Procedures CT head,  EEG   Consults  Neuro   Medications  Scheduled Meds: . antiseptic oral rinse  7 mL Mouth Rinse q12n4p  . atorvastatin  40 mg Oral q1800  . aztreonam  1 g Intravenous Q8H  . carbamazepine  400 mg Oral BID  . chlorhexidine  15 mL Mouth Rinse BID  . clopidogrel  75 mg Oral Daily  . enoxaparin (LOVENOX) injection  40 mg Subcutaneous Q24H  . lacosamide (VIMPAT) IV  50 mg Intravenous Once  . lacosamide  200 mg Oral BID  . levETIRAcetam  1,500 mg Oral BID  . lisinopril  20 mg Oral Daily  . metroNIDAZOLE  500 mg Oral 3 times per day  . sodium chloride  3 mL Intravenous Q12H  . thiamine  100 mg Oral Daily   Continuous Infusions: . 0.9 % NaCl with KCl 40 mEq / L     PRN Meds:.acetaminophen, albuterol, hydrALAZINE, LORazepam, [DISCONTINUED] ondansetron **OR** ondansetron (ZOFRAN) IV  DVT Prophylaxis  Lovenox    Lab Results  Component Value Date   PLT 310 11/17/2013    Antibiotics     Anti-infectives    Start     Dose/Rate Route Frequency Ordered Stop   11/16/13 0900  metroNIDAZOLE (FLAGYL) tablet 500 mg     500 mg Oral 3 times per day 11/16/13 0851     11/15/13 1030  metroNIDAZOLE (FLAGYL) IVPB 500 mg  Status:  Discontinued  500 mg100 mL/hr over 60 Minutes Intravenous Every 8 hours 11/15/13 0903 11/16/13 0851   11/14/13 1900  vancomycin (VANCOCIN) IVPB 750 mg/150 ml premix  Status:  Discontinued     750 mg150 mL/hr over 60 Minutes Intravenous Every 8 hours 11/14/13 1720 11/15/13 0903   11/14/13 1800  aztreonam (AZACTAM) 1 g in dextrose 5 % 50 mL IVPB     1 g100 mL/hr over 30 Minutes Intravenous Every 8 hours 11/14/13 1720            Objective:   Filed Vitals:   11/16/13 2050 11/17/13 0154 11/17/13 0626 11/17/13 1012  BP: 165/86 162/84 185/81 147/78  Pulse: 102 86 85 80  Temp: 98.4 F (36.9 C) 99.2 F (37.3 C) 98.9 F (37.2 C) 100.2 F (37.9 C)  TempSrc: Oral Oral Oral Oral  Resp: 18 18 18 18   Height:      Weight:      SpO2: 100% 99% 100% 97%    Wt  Readings from Last 3 Encounters:  11/14/13 72.3 kg (159 lb 6.3 oz)  11/06/13 73.982 kg (163 lb 1.6 oz)  10/20/13 79.379 kg (175 lb)     Intake/Output Summary (Last 24 hours) at 11/17/13 1053 Last data filed at 11/17/13 0215  Gross per 24 hour  Intake      0 ml  Output   1100 ml  Net  -1100 ml     Physical Exam  Awake, Is confused and able to follow commands reliably or answer questions ,moves all 4 extremities [place].AT,PERRAL Supple Neck,No JVD, No cervical lymphadenopathy appriciated.  Symmetrical Chest wall movement, Good air movement bilaterally, CTAB RRR,No Gallops,Rubs or new Murmurs, No Parasternal Heave +ve B.Sounds, Abd Soft, No tenderness, No organomegaly appriciated, No rebound - guarding or rigidity. No Cyanosis, Clubbing or edema, No new Rash or bruise , L knee efussion - tender, mildly warm >> Right   Data Review   Micro Results Recent Results (from the past 240 hour(s))  MRSA PCR Screening     Status: None   Collection Time: 11/14/13  5:17 PM  Result Value Ref Range Status   MRSA by PCR NEGATIVE NEGATIVE Final    Comment:        The GeneXpert MRSA Assay (FDA approved for NASAL specimens only), is one component of a comprehensive MRSA colonization surveillance program. It is not intended to diagnose MRSA infection nor to guide or monitor treatment for MRSA infections.   Culture, blood (routine x 2)     Status: None (Preliminary result)   Collection Time: 11/14/13  6:49 PM  Result Value Ref Range Status   Specimen Description BLOOD RIGHT HAND  Final   Special Requests BOTTLES DRAWN AEROBIC AND ANAEROBIC 5CC EACH  Final   Culture  Setup Time   Final    11/15/2013 00:57 Performed at [facility]    Culture   Final           BLOOD CULTURE RECEIVED NO GROWTH TO DATE CULTURE WILL BE HELD FOR 5 DAYS BEFORE ISSUING A FINAL NEGATIVE REPORT Performed at [facility]    Report Status PENDING  Incomplete  Culture, blood (routine x 2)      Status: None (Preliminary result)   Collection Time: 11/14/13  7:02 PM  Result Value Ref Range Status   Specimen Description BLOOD RIGHT HAND  Final   Special Requests BOTTLES DRAWN AEROBIC ONLY 5CC  Final   Culture  Setup Time   Final  11/15/2013 00:58 Performed at [facility]   Final           BLOOD CULTURE RECEIVED NO GROWTH TO DATE CULTURE WILL BE HELD FOR 5 DAYS BEFORE ISSUING A FINAL NEGATIVE REPORT Performed at [facility]    Report Status PENDING  Incomplete    Radiology Reports    Ct Head (brain) Wo Contrast  11/14/2013   CLINICAL DATA:  63 year old male with history of prior stroke and 2 seizures this morning. Code stroke.  EXAM: CT HEAD WITHOUT CONTRAST  TECHNIQUE: Contiguous axial images were obtained from the base of the skull through the vertex without intravenous contrast.  COMPARISON:  Recent head CT and brain MRI 11/06/2013  FINDINGS: Negative for acute intracranial hemorrhage, acute infarction, mass, mass effect, hydrocephalus or midline shift. Gray-white differentiation is preserved throughout. Hypoattenuation in the right centrum semiovale consistent with previously identified focal infarct. Chronic microvascular ischemic white matter disease. Remote lacunar infarcts in the right thalamus. No focal soft tissue or calvarial abnormality. Globes and orbits are symmetric bilaterally. Normal aeration of the mastoid air cells and paranasal sinuses. Atherosclerotic calcifications present within both carotid arteries.  IMPRESSION: 1. No acute intracranial abnormality. 2. No significant interval change in the appearance of the brain compared to recent prior imaging. These results will be called to the ordering clinician or representative by the Radiologist Assistant, and communication documented in the PACS or zVision Dashboard.   Electronically Signed   By: Malachy Moan M.D.   On: 11/14/2013 13:16      Mr Brain Wo Contrast  11/15/2013    ADDENDUM REPORT: 11/15/2013 13:12  ADDENDUM: Abnormal and likely obstructed flow in the left vertebral artery is unchanged since the September study.   Electronically Signed   By: Gennette Pac M.D.   On: 11/15/2013 13:12   11/15/2013   CLINICAL DATA:  Recurrent seizure disorder. Recent admission for CVA.  EXAM: MRI HEAD WITHOUT CONTRAST  TECHNIQUE: Multiplanar, multiecho pulse sequences of the brain and surrounding structures were obtained without intravenous contrast.  COMPARISON:  CT head 11/14/2013 and MRI brain 11/06/2013.  FINDINGS: The diffusion-weighted images again shape the area of subacute infarction within the right coronal radiata. No new areas of infarct are evident.  Remote infarcts are again seen within the cerebellum. The remote lacunar infarcts of the brainstem and left midbrain are stable. Atrophy and periventricular white matter changes bilaterally are stable. No acute hemorrhage or mass lesion is evident. The ventricles are proportionate to the degree of atrophy.  Abnormal signal is evident within the left vertebral artery. Flow is present in the remainder of the intracranial circulation. The globes and orbits are intact. Minimal posterior left ethmoid sinus disease is present. The remaining paranasal sinuses and the mastoid air cells are clear.  Dedicated imaging of the temporal lobes demonstrates asymmetric decreased size of the right hippocampus. There is enlargement of the right temporal tip.  IMPRESSION: 1. Expected evolution of white matter infarct within the right corona radiata. 2. Multiple remote lacunar infarcts in extensive white matter disease. This likely reflects the sequela of chronic microvascular ischemia. 3. Asymmetric atrophy of the right hippocampal structure. This may be the source of seizures or related to chronic seizure activity emanating from the right temporal lobe.  Electronically Signed: By: Gennette Pac M.D. On: 11/15/2013 13:02      Dg Chest Port 1  View  11/14/2013   CLINICAL DATA:  Leukocytosis.  EXAM: PORTABLE CHEST - 1  VIEW  COMPARISON:  11/06/2013.  FINDINGS: Normal sized heart. Mild patchy opacity in the lower half of the right lung. Clear left lung. Mild thoracic spine degenerative changes.  IMPRESSION: Mild patchy opacity in the lower half of the right lung, most likely in the right lower lobe and primarily in the perihilar regions. This is most compatible with pneumonia.   Electronically Signed   By: Gordan Payment M.D.   On: 11/14/2013 15:56     CBC  Recent Labs Lab 11/14/13 1254 11/15/13 0325 11/17/13 0841  WBC 18.3* 15.9* 13.0*  HGB 14.9 14.3 13.6  HCT 41.6 39.7 38.3*  PLT 298 261 310  MCV 92.7 93.4 92.7  MCH 33.2 33.6 32.9  MCHC 35.8 36.0 35.5  RDW 12.7 13.1 12.9  LYMPHSABS 1.6  --   --   MONOABS 1.0  --   --   EOSABS 0.0  --   --   BASOSABS 0.0  --   --     Chemistries   Recent Labs Lab 11/14/13 1254 11/17/13 0841  NA 139 135*  K 3.6* 3.4*  CL 102 98  CO2 21 21  GLUCOSE 138* 152*  BUN 10 5*  CREATININE 0.76 0.63  CALCIUM 9.0 8.6  AST 24  --   ALT 28  --   ALKPHOS 124*  --   BILITOT 0.4  --    ------------------------------------------------------------------------------------------------------------------ estimated creatinine clearance is 90.3 mL/min (by C-G formula based on Cr of 0.63). ------------------------------------------------------------------------------------------------------------------ No results for input(s): HGBA1C in the last 72 hours. ------------------------------------------------------------------------------------------------------------------ No results for input(s): CHOL, HDL, LDLCALC, TRIG, CHOLHDL, LDLDIRECT in the last 72 hours. ------------------------------------------------------------------------------------------------------------------ No results for input(s): TSH, T4TOTAL, T3FREE, THYROIDAB in the last 72 hours.  Invalid input(s):  FREET3 ------------------------------------------------------------------------------------------------------------------ No results for input(s): VITAMINB12, FOLATE, FERRITIN, TIBC, IRON, RETICCTPCT in the last 72 hours.  Coagulation profile  Recent Labs Lab 11/14/13 1254  INR 1.18    No results for input(s): DDIMER in the last 72 hours.  Cardiac Enzymes No results for input(s): CKMB, TROPONINI, MYOGLOBIN in the last 168 hours.  Invalid input(s): CK ------------------------------------------------------------------------------------------------------------------ Invalid input(s): POCBNP     Time Spent in minutes   35   Jahlon Baines K M.D on 11/17/2013 at 10:53 AM  Between 7am to 7pm - Pager - [phone]  After 7pm go to [email or web address] - password [email or web address]  And look for the night coverage person covering for me after hours  [facility] Office  [phone]

## 2013-11-17 NOTE — Progress Notes (Signed)
ANTIBIOTIC CONSULT NOTE - INITIAL  Pharmacy Consult for Vancomycin Indication: pneumonia  Allergies  Allergen Reactions  . Orange Juice [Orange Oil] Diarrhea  . Penicillins Nausea And Vomiting    Tolerates Zosyn. "might have been an overdose" (01/22/2012)  . Vicodin [Hydrocodone-Acetaminophen] Other (See Comments)    "triggered seizure both here and at home when he tried to take it" (01/22/2012)    Patient Measurements: Height: 5\' 8"  (172.7 cm) Weight: 159 lb 6.3 oz (72.3 kg) IBW/kg (Calculated) : 68.4  Vital Signs: Temp: 102.2 F (39 C) (11/10 1417) Temp Source: Axillary (11/10 1417) BP: 169/85 mmHg (11/10 1417) Pulse Rate: 114 (11/10 1417) Intake/Output from previous day: 11/09 0701 - 11/10 0700 In: -  Out: 1100 [Urine:1100] Intake/Output from this shift: Total I/O In: 120 [P.O.:120] Out: -   Labs:  Recent Labs  11/15/13 0325 11/17/13 0841  WBC 15.9* 13.0*  HGB 14.3 13.6  PLT 261 310  CREATININE  --  0.63   Estimated Creatinine Clearance: 90.3 mL/min (by C-G formula based on Cr of 0.63). No results for input(s): VANCOTROUGH, VANCOPEAK, VANCORANDOM, GENTTROUGH, GENTPEAK, GENTRANDOM, TOBRATROUGH, TOBRAPEAK, TOBRARND, AMIKACINPEAK, AMIKACINTROU, AMIKACIN in the last 72 hours.   Microbiology: Recent Results (from the past 720 hour(s))  MRSA PCR Screening     Status: None   Collection Time: 11/14/13  5:17 PM  Result Value Ref Range Status   MRSA by PCR NEGATIVE NEGATIVE Final    Comment:        The GeneXpert MRSA Assay (FDA approved for NASAL specimens only), is one component of a comprehensive MRSA colonization surveillance program. It is not intended to diagnose MRSA infection nor to guide or monitor treatment for MRSA infections.   Culture, blood (routine x 2)     Status: None (Preliminary result)   Collection Time: 11/14/13  6:49 PM  Result Value Ref Range Status   Specimen Description BLOOD RIGHT HAND  Final   Special Requests BOTTLES DRAWN AEROBIC  AND ANAEROBIC 5CC EACH  Final   Culture  Setup Time   Final    11/15/2013 00:57 Performed at [facility]    Culture   Final           BLOOD CULTURE RECEIVED NO GROWTH TO DATE CULTURE WILL BE HELD FOR 5 DAYS BEFORE ISSUING A FINAL NEGATIVE REPORT Performed at [facility]    Report Status PENDING  Incomplete  Culture, blood (routine x 2)     Status: None (Preliminary result)   Collection Time: 11/14/13  7:02 PM  Result Value Ref Range Status   Specimen Description BLOOD RIGHT HAND  Final   Special Requests BOTTLES DRAWN AEROBIC ONLY 5CC  Final   Culture  Setup Time   Final    11/15/2013 00:58 Performed at [facility]    Culture   Final           BLOOD CULTURE RECEIVED NO GROWTH TO DATE CULTURE WILL BE HELD FOR 5 DAYS BEFORE ISSUING A FINAL NEGATIVE REPORT Performed at [facility]    Report Status PENDING  Incomplete    Medical History: Past Medical History  Diagnosis Date  . Hypertension   . Peripheral vascular disease   . Aneurysm of femoral artery   . COPD (chronic obstructive pulmonary disease)   . Kidney stone   . Grand mal epilepsy, controlled 1980's  . Stroke Sept. 2012    denies residual (01/22/2012)  . Arthritis     "anwhere I've been hurt  before" (01/22/2012)  . ETOH abuse     Medications:  Prescriptions prior to admission  Medication Sig Dispense Refill Last Dose  . atorvastatin (LIPITOR) 40 MG tablet Take 1 tablet (40 mg total) by mouth daily at 6 PM. 30 tablet 0 11/13/2013 at Unknown time  . carbamazepine (TEGRETOL) 200 MG tablet Take 2 tablets (400 mg total) by mouth 2 (two) times daily. 360 tablet 4 11/14/2013 at Unknown time  . clopidogrel (PLAVIX) 75 MG tablet Take 1 tablet (75 mg total) by mouth daily. 90 tablet 4 11/13/2013 at Unknown time  . levETIRAcetam (KEPPRA) 750 MG tablet Take 750 mg by mouth 2 (two) times daily.   11/14/2013 at Unknown time  . lisinopril (PRINIVIL,ZESTRIL) 20 MG tablet Take 20 mg by mouth daily.     11/13/2013 at Unknown time  . thiamine 100 MG tablet Take 1 tablet (100 mg total) by mouth daily. 30 tablet 0    Scheduled:  . antiseptic oral rinse  7 mL Mouth Rinse q12n4p  . atorvastatin  40 mg Oral q1800  . aztreonam  1 g Intravenous Q8H  . carbamazepine  400 mg Oral BID  . chlorhexidine  15 mL Mouth Rinse BID  . clopidogrel  75 mg Oral Daily  . enoxaparin (LOVENOX) injection  40 mg Subcutaneous Q24H  . lacosamide  200 mg Oral BID  . levETIRAcetam  1,500 mg Oral BID  . lisinopril  20 mg Oral Daily  . metroNIDAZOLE  500 mg Oral 3 times per day  . sodium chloride  3 mL Intravenous Q12H  . thiamine  100 mg Oral Daily   Assessment: 63 y.o male admitted with seizures. History of multiple strokes and advanced vascular dementia. Also alcohol abuse in the past. Pharmacy to start IV vancomycin for Aspiration pneumonia. HCAP. Likely due to Status epilepticus , SCr = 0.63 ,CrCl ~ 90 ml/min  Goal of Therapy:  Vancomycin trough level 15-20 mcg/ml  Plan:  Vancomycin 1gm x 1 then  750 mg IV q8h Continues on aztreonam 1g IV q8h per MD Monitor renal function, clinical status daily and check vanc trough at steady state if needed per protocol.   Noah Delaineuth Katherine Syme, RPh Clinical Pharmacist Pager: [phone] 11/17/2013,3:24 PM

## 2013-11-17 NOTE — Progress Notes (Signed)
Patient ID: Lee [name], male   DOB: 05/07/1950, 63 y.o.   MRN: [id number] After sterile prepping patient's left knee was aspirated from the superior lateral portal and 30 mL of clear yellow synovial fluid was aspirated. I will send this for a white blood cell count with differential as well as examination for pseudogout crystals, and cultures. The fluid clinically does not show signs of infection.

## 2013-11-18 LAB — BASIC METABOLIC PANEL
Anion gap: 14 (ref 5–15)
BUN: 5 mg/dL — ABNORMAL LOW (ref 6–23)
CALCIUM: 8.7 mg/dL (ref 8.4–10.5)
CO2: 24 mEq/L (ref 19–32)
CREATININE: 0.67 mg/dL (ref 0.50–1.35)
Chloride: 95 mEq/L — ABNORMAL LOW (ref 96–112)
GFR calc non Af Amer: >90 mL/min (ref >90)
Glucose, Bld: 119 mg/dL — ABNORMAL HIGH (ref 70–99)
Potassium: 3.8 mEq/L (ref 3.7–5.3)
Sodium: 133 mEq/L — ABNORMAL LOW (ref 137–147)

## 2013-11-18 LAB — CBC
HEMATOCRIT: 38.4 % — AB (ref 39.0–52.0)
Hemoglobin: 13.8 g/dL (ref 13.0–17.0)
MCH: 32.8 pg (ref 26.0–34.0)
MCHC: 35.9 g/dL (ref 30.0–36.0)
MCV: 91.2 fL (ref 78.0–100.0)
Platelets: 298 10*3/uL (ref 150–400)
RBC: 4.21 MIL/uL — ABNORMAL LOW (ref 4.22–5.81)
RDW: 12.9 % (ref 11.5–15.5)
WBC: 14.5 10*3/uL — ABNORMAL HIGH (ref 4.0–10.5)

## 2013-11-18 MED ORDER — CARVEDILOL 3.125 MG PO TABS
3.1250 mg | ORAL_TABLET | Freq: Two times a day (BID) | ORAL | Status: DC
  Administered 2013-11-18 – 2013-11-20 (×4): 3.125 mg via ORAL
  Filled 2013-11-18 (×4): qty 1

## 2013-11-18 MED ORDER — AMLODIPINE BESYLATE 10 MG PO TABS
10.0000 mg | ORAL_TABLET | Freq: Every day | ORAL | Status: DC
  Administered 2013-11-18 – 2013-11-20 (×3): 10 mg via ORAL
  Filled 2013-11-18 (×3): qty 1

## 2013-11-18 MED ORDER — POTASSIUM CHLORIDE IN NACL 40-0.9 MEQ/L-% IV SOLN
INTRAVENOUS | Status: DC
Start: 2013-11-18 — End: 2013-11-19
  Administered 2013-11-18: 75 mL/h via INTRAVENOUS
  Filled 2013-11-18 (×2): qty 1000

## 2013-11-18 NOTE — Progress Notes (Signed)
Physical Therapy Treatment Patient Details Name: KAYLA MERTEL MRN: [id number] DOB: [birth year] Today's Date: 11/18/2013    History of Present Illness Patient is a 63 y/o male with PMH of HTN, PAD, Epilepsy, recurrent CVAs (residual L sided weakness) and probable vascular dementia presents with seizures. Patient was noted by family to have a blank stare and subsequently looked stiffening then generalized clonic activity for about 1 minute, then had another episode lasting 3-5 minutes, and one more episode in EMS. Just discharged 11/2 with CVA and seizures. CT head-(-). MRI (-). CXR- Mild patchy opacity in the lower half of the right lung, compatible with PNA.     PT Comments    Continued to work on breaking extensor tone this session while in sitting position. Patients knees painful to light touch throughout session. Will continue with current POC. Continue to recommend SNF for ongoing Physical Therapy.     Follow Up Recommendations  SNF;Supervision/Assistance - 24 hour     Equipment Recommendations  None recommended by PT    Recommendations for Other Services       Precautions / Restrictions Precautions Precautions: Fall Precaution Comments: full body extension Restrictions Weight Bearing Restrictions: No    Mobility  Bed Mobility Overal bed mobility: Needs Assistance;+2 for physical assistance;+ 2 for safety/equipment Bed Mobility: Supine to Sit;Sit to Supine     Supine to sit: Total assist;+2 for physical assistance Sit to supine: +2 for physical assistance;Total assist   General bed mobility comments: Pt required pad and EOB elevated to pivot on buttock with pad to swing into sitting position. pt calling out with BIL LE off EOB. Pt requires support due to full extension. pt required total +2 to decr extensor tone and promote hip flexion  Transfers                    Ambulation/Gait                 Stairs            Wheelchair Mobility     Modified Rankin (Stroke Patients Only)       Balance     Sitting balance-Leahy Scale: Poor Sitting balance - Comments: Worked on calming extensor tone with UE and LEs while in sitting. Patient able to sit with Min A x11 secs towards end of sitting EOB                             Cognition Arousal/Alertness: Awake/alert Behavior During Therapy: WFL for tasks assessed/performed Overall Cognitive Status: History of cognitive impairments - at baseline Area of Impairment: Memory               General Comments: pt unable to state name or location of pain. continues pt states "oh wait oh"    Exercises      General Comments        Pertinent Vitals/Pain Faces Pain Scale: Hurts whole lot Pain Location: facial grimaces with knee flexion Pain Descriptors / Indicators: Grimacing Pain Intervention(s): Repositioned;Monitored during session    Home Living                      Prior Function            PT Goals (current goals can now be found in the care plan section) Progress towards PT goals: Progressing toward goals    Frequency  Min 3X/week  PT Plan Current plan remains appropriate    Co-evaluation             End of Session Equipment Utilized During Treatment: Gait belt Activity Tolerance: Patient limited by pain;Patient limited by fatigue Patient left: in bed;with call bell/phone within reach     Time: 1055-1119 PT Time Calculation (min) (ACUTE ONLY): 24 min  Charges:  $Therapeutic Activity: 23-37 mins                    G Codes:      Fredrich Birks 11/18/2013, 12:50 PM 11/18/2013 Fredrich Birks PTA [phone] pager [phone] office

## 2013-11-18 NOTE — Clinical Social Work Placement (Addendum)
Clinical Social Work Department CLINICAL SOCIAL WORK PLACEMENT NOTE 11/18/2013  Patient:  Lee [name],Fay C  Account Number:  [id number] Admit date:  11/14/2013  Clinical Social Worker:  Derenda FennelBASHIRA Madisson Kulaga, CLINICAL SOCIAL WORKER  Date/time:  11/18/2013 02:33 PM  Clinical Social Work is seeking post-discharge placement for this patient at the following level of care:   SKILLED NURSING   (*CSW will update this form in Epic as items are completed)   11/18/2013  Patient/family provided with [facility] Department of Clinical Social Work's list of facilities offering this level of care within the geographic area requested by the patient (or if unable, by the patient's family).  11/18/2013  Patient/family informed of their freedom to choose among providers that offer the needed level of care, that participate in Medicare, Medicaid or managed care program needed by the patient, have an available bed and are willing to accept the patient.  11/18/2013  Patient/family informed of [facility]' ownership interest in [facility], as well as of the fact that they are under no obligation to receive care at this facility.  PASARR submitted to EDS on 11/18/2013 PASARR number received on 11/18/2013  FL2 transmitted to all facilities in geographic area requested by pt/family on  11/18/2013 FL2 transmitted to all facilities within larger geographic area on   Patient informed that his/her managed care company has contracts with or will negotiate with  certain facilities, including the following:   YES     Patient/family informed of bed offers received:  11/18/2013 [facility]  Physician recommends and patient chooses bed at    [facility]  on 11/20/2013   Patient to be transferred to facility by [facility] Patient and family notified of transfer on 11/20/2013 Name of family member notified: Pt's wife, Octavio Mannsamela  [place]   The following physician request were entered in Epic:   Additional Comments:   Derenda FennelBashira Stoy Fenn, MSW, LCSWA [phone] 11/18/2013 2:34 PM

## 2013-11-18 NOTE — Progress Notes (Signed)
Pt alert, verbal with no noted distress or seizure activity.  Administered prn Hydralazine for Bp 168/94. Pt states that he is always in pain to bilateral legs rates pain as 6/10 aching and but refuses pain medication. He denies headache or chest pain. HOB elevated. Call bell within reach. Will continue to monitor.

## 2013-11-18 NOTE — Clinical Social Work Psychosocial (Signed)
Clinical Social Work Department BRIEF PSYCHOSOCIAL ASSESSMENT 11/18/2013  Patient:  Lee [name], Lee [name]     Account Number:  [id number]     Admit date:  11/14/2013  Clinical Social Worker:  Glendon Axe, CLINICAL SOCIAL WORKER  Date/Time:  11/18/2013 02:23 PM  Referred by:  Physician  Date Referred:  11/17/2013 Referred for  SNF Placement   Other Referral:   Interview type:  Other - See comment Other interview type:   CSW spoke with pt and pt's wife, Lee [name] via telephone.    PSYCHOSOCIAL DATA Living Status:  WIFE Admitted from facility:   Level of care:   Primary support name:  Lee [name] [phone] Primary support relationship to patient:  SPOUSE Degree of support available:   Strong    CURRENT CONCERNS Current Concerns  Post-Acute Placement   Other Concerns:    SOCIAL WORK ASSESSMENT / PLAN Clinical Social Worker met with pt in reference to post-acute placement for SNF. CSW explained SNF process. Pt stated he was aware of recommendations for SNF and is agreeable to placement. CSW received permission to speak with pt's wife, Lee [name]. CSW contacted pt's wife and reviewed SNF process and list via telephone. CSW has submitted clinicals and awaiting bed offers. FL-2 signed. CSW will continue to follow for support and to facilitate pt's discharge needs once medically stable.   Assessment/plan status:  Psychosocial Support/Ongoing Assessment of Needs Other assessment/ plan:   Information/referral to community resources:   SNF information/ list provided.    PATIENT'S/FAMILY'S RESPONSE TO PLAN OF CARE: Pt lying in bed, alert and orientedX3. Pt reported he is agreeable to SNF placement and stated he was not feeling well. Pt granted permission for CSW to speak with pt's wife. Pt's wife also reported she is agreeable to SNF and expressed complete gratitude for assistance with SNF and nursing care from unit.     Glendon Axe, MSW, LCSWA [phone] 11/18/2013 2:32 PM

## 2013-11-18 NOTE — Progress Notes (Signed)
Patient Demographics  Lee [name], is a 63 y.o. male, DOB - 07-20-1950, IEP:[id number]  Admit date - 11/14/2013   Admitting Physician [name], MD  Outpatient Primary MD for the patient is [name], MD  LOS - 4   Chief Complaint  Patient presents with  . Code Stroke  . Seizures        Subjective:   Lee [name] unreliable historian ++ confused No headache, No chest pain, No abdominal pain - No Nausea, No new weakness tingling or numbness, No Cough - SOB.  ? L knee pain  Assessment & Plan    1.  Status epilepticus, history of multiple strokes and advanced vascular dementia. Also alcohol abuse in the past.  EEG was positive for ongoing seizures, he was at home taking Keppra and Tegretol, dose has been adjusted by neurology and Vimpat has been added dose was increased by neurology on 11/17/2013. Neurology following.   2. Aspiration pneumonia. HCAP. Likely due to #1 above, will add feeding assistance and aspiration precautions. Speech following. Still has fever and persistent right-sided infiltrate. Was on aztreonam, added Flagyl and vancomycin. Monitor clinically.   3. Generalized weakness and severe deconditioning. Will have PT OT and speech evaluate, SNF placement.    4. Essential hypertension. On ACE inhibitor and stable.    5. History of multiple CVAs. Chronic left-sided weakness, On Plavix, statin for secondary prevention. Neuro following. No acute issues on MRI.    6.L knee Pain and effusion - Xray confirms effusion, uric acidunremarkable, new blood cultures ordered, left knee was aspirated by Dr. Lajoyce Corners on 11/17/2013. Cytology is inconclusive but infection cannot be ruled out, follow cultures, continue on IV vancomycin and aztreonam. Orthopedic following  discussed with Dr. Lajoyce Corners.     Code Status: Full  Family Communication: none present  Disposition Plan: TBD   Procedures CT head, EEG   Consults  Neuro, Ortho   Medications  Scheduled Meds: . antiseptic oral rinse  7 mL Mouth Rinse q12n4p  . atorvastatin  40 mg Oral q1800  . aztreonam  1 g Intravenous Q8H  . carbamazepine  400 mg Oral BID  . chlorhexidine  15 mL Mouth Rinse BID  . clopidogrel  75 mg Oral Daily  . enoxaparin (LOVENOX) injection  40 mg Subcutaneous Q24H  . lacosamide  200 mg Oral BID  . levETIRAcetam  1,500 mg Oral BID  . lisinopril  20 mg Oral Daily  . metroNIDAZOLE  500 mg Oral 3 times per day  . sodium chloride  3 mL Intravenous Q12H  . thiamine  100 mg Oral Daily  . vancomycin  750 mg Intravenous Q8H   Continuous Infusions: . 0.9 % NaCl with KCl 40 mEq / L 50 mL/hr (11/17/13 1158)   PRN Meds:.acetaminophen, albuterol, hydrALAZINE, LORazepam, [DISCONTINUED] ondansetron **OR** ondansetron (ZOFRAN) IV  DVT Prophylaxis  Lovenox    Lab Results  Component Value Date   PLT 298 11/18/2013    Antibiotics     Anti-infectives    Start     Dose/Rate Route Frequency Ordered Stop   11/17/13 2200  vancomycin (VANCOCIN) IVPB 750 mg/150 ml premix     750 mg150 mL/hr over 60 Minutes Intravenous Every 8 hours 11/17/13 1530  11/17/13 1300  vancomycin (VANCOCIN) IVPB 1000 mg/200 mL premix     1,000 mg200 mL/hr over 60 Minutes Intravenous STAT 11/17/13 1235 11/17/13 1500   11/16/13 0900  metroNIDAZOLE (FLAGYL) tablet 500 mg     500 mg Oral 3 times per day 11/16/13 0851     11/15/13 1030  metroNIDAZOLE (FLAGYL) IVPB 500 mg  Status:  Discontinued     500 mg100 mL/hr over 60 Minutes Intravenous Every 8 hours 11/15/13 0903 11/16/13 0851   11/14/13 1900  vancomycin (VANCOCIN) IVPB 750 mg/150 ml premix  Status:  Discontinued     750 mg150 mL/hr over 60 Minutes Intravenous Every 8 hours 11/14/13 1720 11/15/13 0903   11/14/13 1800  aztreonam (AZACTAM) 1 g in  dextrose 5 % 50 mL IVPB     1 g100 mL/hr over 30 Minutes Intravenous Every 8 hours 11/14/13 1720            Objective:   Filed Vitals:   11/17/13 2155 11/18/13 0313 11/18/13 0612 11/18/13 0936  BP: 176/88 125/64 152/65 163/81  Pulse: 109 100 81 96  Temp: 98.6 F (37 C) 99.4 F (37.4 C) 98.6 F (37 C) 101.7 F (38.7 C)  TempSrc: Oral Oral Oral Oral  Resp: 18 18 18 18   Height:      Weight:      SpO2: 96% 99% 96% 99%    Wt Readings from Last 3 Encounters:  11/14/13 72.3 kg (159 lb 6.3 oz)  11/06/13 73.982 kg (163 lb 1.6 oz)  10/20/13 79.379 kg (175 lb)     Intake/Output Summary (Last 24 hours) at 11/18/13 1025 Last data filed at 11/18/13 0840  Gross per 24 hour  Intake    240 ml  Output    700 ml  Net   -460 ml     Physical Exam  Awake, Is confused and able to follow commands reliably or answer questions ,moves all 4 extremities [place].AT,PERRAL Supple Neck,No JVD, No cervical lymphadenopathy appriciated.  Symmetrical Chest wall movement, Good air movement bilaterally, CTAB RRR,No Gallops,Rubs or new Murmurs, No Parasternal Heave +ve B.Sounds, Abd Soft, No tenderness, No organomegaly appriciated, No rebound - guarding or rigidity. No Cyanosis, Clubbing or edema, No new Rash or bruise , L knee efussion - tender, mildly warm >> Right   Data Review   Micro Results Recent Results (from the past 240 hour(s))  MRSA PCR Screening     Status: None   Collection Time: 11/14/13  5:17 PM  Result Value Ref Range Status   MRSA by PCR NEGATIVE NEGATIVE Final    Comment:        The GeneXpert MRSA Assay (FDA approved for NASAL specimens only), is one component of a comprehensive MRSA colonization surveillance program. It is not intended to diagnose MRSA infection nor to guide or monitor treatment for MRSA infections.   Culture, blood (routine x 2)     Status: None (Preliminary result)   Collection Time: 11/14/13  6:49 PM  Result Value Ref Range Status   Specimen  Description BLOOD RIGHT HAND  Final   Special Requests BOTTLES DRAWN AEROBIC AND ANAEROBIC 5CC EACH  Final   Culture  Setup Time   Final    11/15/2013 00:57 Performed at [facility]    Culture   Final           BLOOD CULTURE RECEIVED NO GROWTH TO DATE CULTURE WILL BE HELD FOR 5 DAYS BEFORE ISSUING A FINAL NEGATIVE REPORT Performed at [facility]    Report Status PENDING  Incomplete  Culture, blood (routine x 2)     Status: None (Preliminary result)   Collection Time: 11/14/13  7:02 PM  Result Value Ref Range Status   Specimen Description BLOOD RIGHT HAND  Final   Special Requests BOTTLES DRAWN AEROBIC ONLY 5CC  Final   Culture  Setup Time   Final    11/15/2013 00:58 Performed at [facility]    Culture   Final           BLOOD CULTURE RECEIVED NO GROWTH TO DATE CULTURE WILL BE HELD FOR 5 DAYS BEFORE ISSUING A FINAL NEGATIVE REPORT Performed at [facility]    Report Status PENDING  Incomplete  Culture, blood (routine x 2)     Status: None (Preliminary result)   Collection Time: 11/17/13  2:20 PM  Result Value Ref Range Status   Specimen Description BLOOD RIGHT HAND  Final   Special Requests BOTTLES DRAWN AEROBIC AND ANAEROBIC 10CC  Final   Culture  Setup Time   Final    11/17/2013 22:09 Performed at [facility]    Culture   Final           BLOOD CULTURE RECEIVED NO GROWTH TO DATE CULTURE WILL BE HELD FOR 5 DAYS BEFORE ISSUING A FINAL NEGATIVE REPORT Performed at [facility]    Report Status PENDING  Incomplete  Culture, blood (routine x 2)     Status: None (Preliminary result)   Collection Time: 11/17/13  2:24 PM  Result Value Ref Range Status   Specimen Description BLOOD LEFT HAND  Final   Special Requests BOTTLES DRAWN AEROBIC AND ANAEROBIC 10CC  Final   Culture  Setup Time   Final    11/17/2013 22:10 Performed at [facility]    Culture   Final           BLOOD CULTURE RECEIVED NO GROWTH TO DATE CULTURE WILL  BE HELD FOR 5 DAYS BEFORE ISSUING A FINAL NEGATIVE REPORT Performed at [facility]    Report Status PENDING  Incomplete  Body fluid culture     Status: None (Preliminary result)   Collection Time: 11/17/13  6:36 PM  Result Value Ref Range Status   Specimen Description SYNOVIAL FLUID KNEE LEFT  Final   Special Requests Normal  Final   Gram Stain   Final    ABUNDANT WBC PRESENT, PREDOMINANTLY PMN NO ORGANISMS SEEN Performed at [facility]    Culture NO GROWTH Performed at [facility]   Final   Report Status PENDING  Incomplete    Radiology Reports    Ct Head (brain) Wo Contrast  11/14/2013   CLINICAL DATA:  63 year old male with history of prior stroke and 2 seizures this morning. Code stroke.  EXAM: CT HEAD WITHOUT CONTRAST  TECHNIQUE: Contiguous axial images were obtained from the base of the skull through the vertex without intravenous contrast.  COMPARISON:  Recent head CT and brain MRI 11/06/2013  FINDINGS: Negative for acute intracranial hemorrhage, acute infarction, mass, mass effect, hydrocephalus or midline shift. Gray-white differentiation is preserved throughout. Hypoattenuation in the right centrum semiovale consistent with previously identified focal infarct. Chronic microvascular ischemic white matter disease. Remote lacunar infarcts in the right thalamus. No focal soft tissue or calvarial abnormality. Globes and orbits are symmetric bilaterally. Normal aeration of the mastoid air cells and paranasal sinuses. Atherosclerotic calcifications present within both carotid arteries.  IMPRESSION: 1. No  acute intracranial abnormality. 2. No significant interval change in the appearance of the brain compared to recent prior imaging. These results will be called to the ordering clinician or representative by the Radiologist Assistant, and communication documented in the PACS or zVision Dashboard.   Electronically Signed   By: Malachy Moan M.D.   On:  11/14/2013 13:16      Mr Brain Wo Contrast  11/15/2013   ADDENDUM REPORT: 11/15/2013 13:12  ADDENDUM: Abnormal and likely obstructed flow in the left vertebral artery is unchanged since the September study.   Electronically Signed   By: Gennette Pac M.D.   On: 11/15/2013 13:12   11/15/2013   CLINICAL DATA:  Recurrent seizure disorder. Recent admission for CVA.  EXAM: MRI HEAD WITHOUT CONTRAST  TECHNIQUE: Multiplanar, multiecho pulse sequences of the brain and surrounding structures were obtained without intravenous contrast.  COMPARISON:  CT head 11/14/2013 and MRI brain 11/06/2013.  FINDINGS: The diffusion-weighted images again shape the area of subacute infarction within the right coronal radiata. No new areas of infarct are evident.  Remote infarcts are again seen within the cerebellum. The remote lacunar infarcts of the brainstem and left midbrain are stable. Atrophy and periventricular white matter changes bilaterally are stable. No acute hemorrhage or mass lesion is evident. The ventricles are proportionate to the degree of atrophy.  Abnormal signal is evident within the left vertebral artery. Flow is present in the remainder of the intracranial circulation. The globes and orbits are intact. Minimal posterior left ethmoid sinus disease is present. The remaining paranasal sinuses and the mastoid air cells are clear.  Dedicated imaging of the temporal lobes demonstrates asymmetric decreased size of the right hippocampus. There is enlargement of the right temporal tip.  IMPRESSION: 1. Expected evolution of white matter infarct within the right corona radiata. 2. Multiple remote lacunar infarcts in extensive white matter disease. This likely reflects the sequela of chronic microvascular ischemia. 3. Asymmetric atrophy of the right hippocampal structure. This may be the source of seizures or related to chronic seizure activity emanating from the right temporal lobe.  Electronically Signed: By: Gennette Pac M.D. On: 11/15/2013 13:02      Dg Chest Port 1 View  11/14/2013   CLINICAL DATA:  Leukocytosis.  EXAM: PORTABLE CHEST - 1 VIEW  COMPARISON:  11/06/2013.  FINDINGS: Normal sized heart. Mild patchy opacity in the lower half of the right lung. Clear left lung. Mild thoracic spine degenerative changes.  IMPRESSION: Mild patchy opacity in the lower half of the right lung, most likely in the right lower lobe and primarily in the perihilar regions. This is most compatible with pneumonia.   Electronically Signed   By: Gordan Payment M.D.   On: 11/14/2013 15:56     CBC  Recent Labs Lab 11/14/13 1254 11/15/13 0325 11/17/13 0841 11/18/13 0934  WBC 18.3* 15.9* 13.0* 14.5*  HGB 14.9 14.3 13.6 13.8  HCT 41.6 39.7 38.3* 38.4*  PLT 298 261 310 298  MCV 92.7 93.4 92.7 91.2  MCH 33.2 33.6 32.9 32.8  MCHC 35.8 36.0 35.5 35.9  RDW 12.7 13.1 12.9 12.9  LYMPHSABS 1.6  --   --   --   MONOABS 1.0  --   --   --   EOSABS 0.0  --   --   --   BASOSABS 0.0  --   --   --     Chemistries   Recent Labs Lab 11/14/13 1254 11/17/13 0841  NA 139  135*  K 3.6* 3.4*  CL 102 98  CO2 21 21  GLUCOSE 138* 152*  BUN 10 5*  CREATININE 0.76 0.63  CALCIUM 9.0 8.6  AST 24  --   ALT 28  --   ALKPHOS 124*  --   BILITOT 0.4  --    ------------------------------------------------------------------------------------------------------------------ estimated creatinine clearance is 90.3 mL/min (by C-G formula based on Cr of 0.63). ------------------------------------------------------------------------------------------------------------------ No results for input(s): HGBA1C in the last 72 hours. ------------------------------------------------------------------------------------------------------------------ No results for input(s): CHOL, HDL, LDLCALC, TRIG, CHOLHDL, LDLDIRECT in the last 72  hours. ------------------------------------------------------------------------------------------------------------------ No results for input(s): TSH, T4TOTAL, T3FREE, THYROIDAB in the last 72 hours.  Invalid input(s): FREET3 ------------------------------------------------------------------------------------------------------------------ No results for input(s): VITAMINB12, FOLATE, FERRITIN, TIBC, IRON, RETICCTPCT in the last 72 hours.  Coagulation profile  Recent Labs Lab 11/14/13 1254  INR 1.18    No results for input(s): DDIMER in the last 72 hours.  Cardiac Enzymes No results for input(s): CKMB, TROPONINI, MYOGLOBIN in the last 168 hours.  Invalid input(s): CK ------------------------------------------------------------------------------------------------------------------ Invalid input(s): POCBNP     Time Spent in minutes   35   Harlo Jaso K M.D on 11/18/2013 at 10:25 AM  Between 7am to 7pm - Pager - [phone]  After 7pm go to [email or web address] - password [email or web address]  And look for the night coverage person covering for me after hours  [facility] Office  [phone]

## 2013-11-18 NOTE — Progress Notes (Signed)
Subjective: Patient noted by nursing yesterday to have an episode where he was noted to have left sided shaking for about 15 seconds.  Has had no further events overnight.    Objective: Current vital signs: BP 152/65 mmHg  Pulse 81  Temp(Src) 98.6 F (37 C) (Oral)  Resp 18  Ht 5\' 8"  (1.727 m)  Wt 72.3 kg (159 lb 6.3 oz)  BMI 24.24 kg/m2  SpO2 96% Vital signs in last 24 hours: Temp:  [98.6 F (37 C)-102.2 F (39 C)] 98.6 F (37 C) (11/11 0612) Pulse Rate:  [80-114] 81 (11/11 0612) Resp:  [18] 18 (11/11 0612) BP: (125-176)/(64-88) 152/65 mmHg (11/11 0612) SpO2:  [95 %-100 %] 96 % (11/11 0612)  Intake/Output from previous day: 11/10 0701 - 11/11 0700 In: 120 [P.O.:120] Out: 700 [Urine:700] Intake/Output this shift: Total I/O In: 120 [P.O.:120] Out: -  Nutritional status: Diet Heart  Neurologic Exam: Mental Status: Alert and awake.  Avoids all questions that have to do with orientation.  Speech fluent without evidence of aphasia.  Does not follow commands.  Does not make eye contact with examiner.   Cranial Nerves: II: Discs flat bilaterally; Visual fields grossly normal, pupils equal, round, reactive to light and accommodation III,IV, VI: ptosis not present, extra-ocular motions intact bilaterally V,VII: smile symmetric, facial light touch sensation normal bilaterally VIII: hearing normal bilaterally IX,X: gag reflex present XI: bilateral shoulder shrug XII: midline tongue extension Motor: Moves all extremities against gravity spontaneously.   Deep Tendon Reflexes: 2+ and symmetric throughout  Lab Results: Basic Metabolic Panel:  Recent Labs Lab 11/14/13 1254 11/17/13 0841  NA 139 135*  K 3.6* 3.4*  CL 102 98  CO2 21 21  GLUCOSE 138* 152*  BUN 10 5*  CREATININE 0.76 0.63  CALCIUM 9.0 8.6    Liver Function Tests:  Recent Labs Lab 11/14/13 1254  AST 24  ALT 28  ALKPHOS 124*  BILITOT 0.4  PROT 7.1  ALBUMIN 3.5   No results for input(s): LIPASE,  AMYLASE in the last 168 hours. No results for input(s): AMMONIA in the last 168 hours.  CBC:  Recent Labs Lab 11/14/13 1254 11/15/13 0325 11/17/13 0841  WBC 18.3* 15.9* 13.0*  NEUTROABS 15.7*  --   --   HGB 14.9 14.3 13.6  HCT 41.6 39.7 38.3*  MCV 92.7 93.4 92.7  PLT 298 261 310    Cardiac Enzymes: No results for input(s): CKTOTAL, CKMB, CKMBINDEX, TROPONINI in the last 168 hours.  Lipid Panel: No results for input(s): CHOL, TRIG, HDL, CHOLHDL, VLDL, LDLCALC in the last 168 hours.  CBG:  Recent Labs Lab 11/14/13 1306  GLUCAP 131*    Microbiology: Results for orders placed or performed during the hospital encounter of 11/14/13  MRSA PCR Screening     Status: None   Collection Time: 11/14/13  5:17 PM  Result Value Ref Range Status   MRSA by PCR NEGATIVE NEGATIVE Final    Comment:        The GeneXpert MRSA Assay (FDA approved for NASAL specimens only), is one component of a comprehensive MRSA colonization surveillance program. It is not intended to diagnose MRSA infection nor to guide or monitor treatment for MRSA infections.   Culture, blood (routine x 2)     Status: None (Preliminary result)   Collection Time: 11/14/13  6:49 PM  Result Value Ref Range Status   Specimen Description BLOOD RIGHT HAND  Final   Special Requests BOTTLES DRAWN AEROBIC AND ANAEROBIC 5CC EACH  Final   Culture  Setup Time   Final    11/15/2013 00:57 Performed at [facility]    Culture   Final           BLOOD CULTURE RECEIVED NO GROWTH TO DATE CULTURE WILL BE HELD FOR 5 DAYS BEFORE ISSUING A FINAL NEGATIVE REPORT Performed at [facility]    Report Status PENDING  Incomplete  Culture, blood (routine x 2)     Status: None (Preliminary result)   Collection Time: 11/14/13  7:02 PM  Result Value Ref Range Status   Specimen Description BLOOD RIGHT HAND  Final   Special Requests BOTTLES DRAWN AEROBIC ONLY 5CC  Final   Culture  Setup Time   Final    11/15/2013  00:58 Performed at [facility]    Culture   Final           BLOOD CULTURE RECEIVED NO GROWTH TO DATE CULTURE WILL BE HELD FOR 5 DAYS BEFORE ISSUING A FINAL NEGATIVE REPORT Performed at [facility]    Report Status PENDING  Incomplete  Culture, blood (routine x 2)     Status: None (Preliminary result)   Collection Time: 11/17/13  2:20 PM  Result Value Ref Range Status   Specimen Description BLOOD RIGHT HAND  Final   Special Requests BOTTLES DRAWN AEROBIC AND ANAEROBIC 10CC  Final   Culture  Setup Time   Final    11/17/2013 22:09 Performed at [facility]    Culture   Final           BLOOD CULTURE RECEIVED NO GROWTH TO DATE CULTURE WILL BE HELD FOR 5 DAYS BEFORE ISSUING A FINAL NEGATIVE REPORT Performed at [facility]    Report Status PENDING  Incomplete  Culture, blood (routine x 2)     Status: None (Preliminary result)   Collection Time: 11/17/13  2:24 PM  Result Value Ref Range Status   Specimen Description BLOOD LEFT HAND  Final   Special Requests BOTTLES DRAWN AEROBIC AND ANAEROBIC 10CC  Final   Culture  Setup Time   Final    11/17/2013 22:10 Performed at [facility]    Culture   Final           BLOOD CULTURE RECEIVED NO GROWTH TO DATE CULTURE WILL BE HELD FOR 5 DAYS BEFORE ISSUING A FINAL NEGATIVE REPORT Performed at [facility]    Report Status PENDING  Incomplete  Body fluid culture     Status: None (Preliminary result)   Collection Time: 11/17/13  6:36 PM  Result Value Ref Range Status   Specimen Description SYNOVIAL FLUID KNEE LEFT  Final   Special Requests Normal  Final   Gram Stain   Final    ABUNDANT WBC PRESENT, PREDOMINANTLY PMN NO ORGANISMS SEEN Performed at [facility]    Culture NO GROWTH Performed at [facility]   Final   Report Status PENDING  Incomplete    Coagulation Studies: No results for input(s): LABPROT, INR in the last 72 hours.  Imaging: Dg Chest Port 1  View  11/17/2013   CLINICAL DATA:  Cough and fever.  Initial encounter  EXAM: PORTABLE CHEST - 1 VIEW  COMPARISON:  11/14/2013  FINDINGS: There is a subtle opacity at the medial right base with suggestion of air bronchograms. No effusion or cavitation. Normal heart size. Negative aortic contours.  IMPRESSION: Unchanged airspace disease at the medial right base, presumably pneumonia given the  clinical circumstances.   Electronically Signed   By: Tiburcio PeaJonathan  Watts M.D.   On: 11/17/2013 11:22   Dg Knee Complete 4 Views Left  11/17/2013   CLINICAL DATA:  Left knee swelling. Limited range of motion. Left knee pain.  EXAM: LEFT KNEE - COMPLETE 4+ VIEW  COMPARISON:  11/24/2009  FINDINGS: There is chondrocalcinosis. Joint space narrowing and spurring within all 3 compartments. Large joint effusion noted. No fracture, subluxation or dislocation. Vascular calcifications throughout the posterior soft tissues.  IMPRESSION: Chondrocalcinosis and moderate degenerative changes. Large joint effusion. No acute bony abnormality.   Electronically Signed   By: Charlett NoseKevin  Dover M.D.   On: 11/17/2013 13:09    Medications:  I have reviewed the patient's current medications. Scheduled: . antiseptic oral rinse  7 mL Mouth Rinse q12n4p  . atorvastatin  40 mg Oral q1800  . aztreonam  1 g Intravenous Q8H  . carbamazepine  400 mg Oral BID  . chlorhexidine  15 mL Mouth Rinse BID  . clopidogrel  75 mg Oral Daily  . enoxaparin (LOVENOX) injection  40 mg Subcutaneous Q24H  . lacosamide  200 mg Oral BID  . levETIRAcetam  1,500 mg Oral BID  . lisinopril  20 mg Oral Daily  . metroNIDAZOLE  500 mg Oral 3 times per day  . sodium chloride  3 mL Intravenous Q12H  . thiamine  100 mg Oral Daily  . vancomycin  750 mg Intravenous Q8H    Assessment/Plan: Patient with seizure yesterday.  On Keppra, Tegretol and Vimpat.  Last Tegretol level on 11/7 was 5.4.    Recommendations: 1.  Patient given a small bolus of Vimpat yesterday after  seizure.   2.  Increase Vimpat to 200mg  BID 3.  Continue seizure precautions   LOS: 4 days   Thana FarrLeslie Mahlik Lenn, MD [facility] [phone] 11/18/2013  8:59 AM

## 2013-11-19 ENCOUNTER — Inpatient Hospital Stay (HOSPITAL_COMMUNITY): Payer: Medicare Other

## 2013-11-19 LAB — URINALYSIS, ROUTINE W REFLEX MICROSCOPIC
BILIRUBIN URINE: NEGATIVE
GLUCOSE, UA: NEGATIVE mg/dL
KETONES UR: NEGATIVE mg/dL
Leukocytes, UA: NEGATIVE
Nitrite: NEGATIVE
PROTEIN: NEGATIVE mg/dL
Specific Gravity, Urine: 1.021 (ref 1.005–1.030)
UROBILINOGEN UA: 1 mg/dL (ref 0.0–1.0)
pH: 7 (ref 5.0–8.0)

## 2013-11-19 LAB — URINE MICROSCOPIC-ADD ON

## 2013-11-19 LAB — BASIC METABOLIC PANEL
Anion gap: 14 (ref 5–15)
BUN: 5 mg/dL — ABNORMAL LOW (ref 6–23)
CALCIUM: 8.7 mg/dL (ref 8.4–10.5)
CO2: 22 mEq/L (ref 19–32)
CREATININE: 0.61 mg/dL (ref 0.50–1.35)
Chloride: 94 mEq/L — ABNORMAL LOW (ref 96–112)
GFR calc Af Amer: >90 mL/min (ref >90)
GLUCOSE: 103 mg/dL — AB (ref 70–99)
Potassium: 4.3 mEq/L (ref 3.7–5.3)
SODIUM: 130 meq/L — AB (ref 137–147)

## 2013-11-19 LAB — CBC
HEMATOCRIT: 37.5 % — AB (ref 39.0–52.0)
Hemoglobin: 13.7 g/dL (ref 13.0–17.0)
MCH: 33.4 pg (ref 26.0–34.0)
MCHC: 36.5 g/dL — AB (ref 30.0–36.0)
MCV: 91.5 fL (ref 78.0–100.0)
Platelets: 272 10*3/uL (ref 150–400)
RBC: 4.1 MIL/uL — ABNORMAL LOW (ref 4.22–5.81)
RDW: 13.1 % (ref 11.5–15.5)
WBC: 14.5 10*3/uL — ABNORMAL HIGH (ref 4.0–10.5)

## 2013-11-19 LAB — OSMOLALITY: Osmolality: 273 mOsm/kg — ABNORMAL LOW (ref 275–300)

## 2013-11-19 LAB — VANCOMYCIN, TROUGH: Vancomycin Tr: 9.9 ug/mL — ABNORMAL LOW (ref 10.0–20.0)

## 2013-11-19 LAB — OSMOLALITY, URINE: Osmolality, Ur: 560 mOsm/kg (ref 390–1090)

## 2013-11-19 LAB — SODIUM, URINE, RANDOM: Sodium, Ur: 131 mEq/L

## 2013-11-19 LAB — MAGNESIUM: Magnesium: 1.9 mg/dL (ref 1.5–2.5)

## 2013-11-19 MED ORDER — METHYLPREDNISOLONE SODIUM SUCC 125 MG IJ SOLR
60.0000 mg | Freq: Every day | INTRAMUSCULAR | Status: DC
  Administered 2013-11-19 – 2013-11-20 (×2): 60 mg via INTRAVENOUS
  Filled 2013-11-19 (×2): qty 2

## 2013-11-19 MED ORDER — COLCHICINE 0.6 MG PO TABS
0.6000 mg | ORAL_TABLET | Freq: Two times a day (BID) | ORAL | Status: DC
  Administered 2013-11-19 – 2013-11-20 (×3): 0.6 mg via ORAL
  Filled 2013-11-19 (×3): qty 1

## 2013-11-19 MED ORDER — VANCOMYCIN HCL IN DEXTROSE 1-5 GM/200ML-% IV SOLN
1000.0000 mg | Freq: Three times a day (TID) | INTRAVENOUS | Status: DC
  Administered 2013-11-19 – 2013-11-20 (×3): 1000 mg via INTRAVENOUS
  Filled 2013-11-19 (×5): qty 200

## 2013-11-19 NOTE — Progress Notes (Signed)
Patient Demographics  Lee [name], is a 63 y.o. male, DOB - July 25, 1950, MWU:[id number]  Admit date - 11/14/2013   Admitting Physician [name], MD  Outpatient Primary MD for the patient is [name], MD  LOS - 5   Chief Complaint  Patient presents with  . Code Stroke  . Seizures        Subjective:   [name] unreliable historian ++ confused No headache, No chest pain, No abdominal pain - No Nausea, No new weakness tingling or numbness, No Cough - SOB.  Improved L knee pain  Assessment & Plan    1.  Status epilepticus, history of multiple strokes and advanced vascular dementia. Also alcohol abuse in the past.  EEG was positive for ongoing seizures, he was at home taking Keppra and Tegretol, dose has been adjusted by neurology and Vimpat has been added dose was increased by neurology on 11/17/2013. Neurology following.   2. Aspiration pneumonia. HCAP. Likely due to #1 above, will add feeding assistance and aspiration precautions. Speech following. Still has fever and persistent right-sided infiltrate. Was on aztreonam, added Flagyl and vancomycin. Monitor clinically.   3. Generalized weakness and severe deconditioning. Will have PT OT and speech evaluate, SNF placement.    4. Essential hypertension. On ACE inhibitor, added Norvasc along with Coreg for better control.    5. History of multiple CVAs. Chronic left-sided weakness, On Plavix, statin for secondary prevention. Neuro following. No acute issues on MRI.    6.L knee Pain and effusion - Xray confirms effusion, uric acidunremarkable, new blood cultures ordered, left knee was aspirated by Dr. Lajoyce Corners on 11/17/2013. Most likely pseudogout per orthopedics, fluid cultures negative thus far. Added Solu-Medrol IV  along with Colchicine. Monitor.   7. Hyponatremia. Worsening with hydration, could be SIADH, hold fluids, obtain urine sodium osmolality and serum osmolality. Repeat BMP.      Code Status: Full  Family Communication: none present  Disposition Plan: TBD   Procedures CT head, EEG   Consults  Neuro, Ortho   Medications  Scheduled Meds: . amLODipine  10 mg Oral Daily  . antiseptic oral rinse  7 mL Mouth Rinse q12n4p  . atorvastatin  40 mg Oral q1800  . aztreonam  1 g Intravenous Q8H  . carbamazepine  400 mg Oral BID  . carvedilol  3.125 mg Oral BID WC  . chlorhexidine  15 mL Mouth Rinse BID  . clopidogrel  75 mg Oral Daily  . colchicine  0.6 mg Oral BID  . enoxaparin (LOVENOX) injection  40 mg Subcutaneous Q24H  . lacosamide  200 mg Oral BID  . levETIRAcetam  1,500 mg Oral BID  . lisinopril  20 mg Oral Daily  . methylPREDNISolone (SOLU-MEDROL) injection  60 mg Intravenous Daily  . metroNIDAZOLE  500 mg Oral 3 times per day  . sodium chloride  3 mL Intravenous Q12H  . thiamine  100 mg Oral Daily  . vancomycin  1,000 mg Intravenous Q8H   Continuous Infusions:   PRN Meds:.acetaminophen, albuterol, hydrALAZINE, LORazepam, [DISCONTINUED] ondansetron **OR** ondansetron (ZOFRAN) IV  DVT Prophylaxis  Lovenox    Lab Results  Component Value Date   PLT 272 11/19/2013    Antibiotics     Anti-infectives  Start     Dose/Rate Route Frequency Ordered Stop   11/19/13 1400  vancomycin (VANCOCIN) IVPB 1000 mg/200 mL premix     1,000 mg200 mL/hr over 60 Minutes Intravenous Every 8 hours 11/19/13 0848     11/17/13 2200  vancomycin (VANCOCIN) IVPB 750 mg/150 ml premix  Status:  Discontinued     750 mg150 mL/hr over 60 Minutes Intravenous Every 8 hours 11/17/13 1530 11/19/13 0848   11/17/13 1300  vancomycin (VANCOCIN) IVPB 1000 mg/200 mL premix     1,000 mg200 mL/hr over 60 Minutes Intravenous STAT 11/17/13 1235 11/17/13 1500   11/16/13 0900  metroNIDAZOLE (FLAGYL) tablet 500  mg     500 mg Oral 3 times per day 11/16/13 0851     11/15/13 1030  metroNIDAZOLE (FLAGYL) IVPB 500 mg  Status:  Discontinued     500 mg100 mL/hr over 60 Minutes Intravenous Every 8 hours 11/15/13 0903 11/16/13 0851   11/14/13 1900  vancomycin (VANCOCIN) IVPB 750 mg/150 ml premix  Status:  Discontinued     750 mg150 mL/hr over 60 Minutes Intravenous Every 8 hours 11/14/13 1720 11/15/13 0903   11/14/13 1800  aztreonam (AZACTAM) 1 g in dextrose 5 % 50 mL IVPB     1 g100 mL/hr over 30 Minutes Intravenous Every 8 hours 11/14/13 1720            Objective:   Filed Vitals:   11/18/13 2126 11/19/13 0118 11/19/13 0508 11/19/13 0948  BP: 140/74 160/87 159/81 126/87  Pulse: 89 89 86 93  Temp: 100.4 F (38 C) 99.4 F (37.4 C) 98.5 F (36.9 C) 98 F (36.7 C)  TempSrc: Oral Axillary Oral Oral  Resp: 18 18 18 18   Height:      Weight:      SpO2: 98% 100% 100% 97%    Wt Readings from Last 3 Encounters:  11/14/13 72.3 kg (159 lb 6.3 oz)  11/06/13 73.982 kg (163 lb 1.6 oz)  10/20/13 79.379 kg (175 lb)     Intake/Output Summary (Last 24 hours) at 11/19/13 1015 Last data filed at 11/19/13 0509  Gross per 24 hour  Intake      0 ml  Output    800 ml  Net   -800 ml     Physical Exam  Awake, Is confused and able to follow commands reliably or answer questions ,moves all 4 extremities [place].AT,PERRAL Supple Neck,No JVD, No cervical lymphadenopathy appriciated.  Symmetrical Chest wall movement, Good air movement bilaterally, CTAB RRR,No Gallops,Rubs or new Murmurs, No Parasternal Heave +ve B.Sounds, Abd Soft, No tenderness, No organomegaly appriciated, No rebound - guarding or rigidity. No Cyanosis, Clubbing or edema, No new Rash or bruise , L knee efussion - tender, mildly warm >> Right   Data Review   Micro Results Recent Results (from the past 240 hour(s))  MRSA PCR Screening     Status: None   Collection Time: 11/14/13  5:17 PM  Result Value Ref Range Status   MRSA by PCR  NEGATIVE NEGATIVE Final    Comment:        The GeneXpert MRSA Assay (FDA approved for NASAL specimens only), is one component of a comprehensive MRSA colonization surveillance program. It is not intended to diagnose MRSA infection nor to guide or monitor treatment for MRSA infections.   Culture, blood (routine x 2)     Status: None (Preliminary result)   Collection Time: 11/14/13  6:49 PM  Result Value Ref Range Status   Specimen  Description BLOOD RIGHT HAND  Final   Special Requests BOTTLES DRAWN AEROBIC AND ANAEROBIC 5CC EACH  Final   Culture  Setup Time   Final    11/15/2013 00:57 Performed at [facility]    Culture   Final           BLOOD CULTURE RECEIVED NO GROWTH TO DATE CULTURE WILL BE HELD FOR 5 DAYS BEFORE ISSUING A FINAL NEGATIVE REPORT Performed at [facility]    Report Status PENDING  Incomplete  Culture, blood (routine x 2)     Status: None (Preliminary result)   Collection Time: 11/14/13  7:02 PM  Result Value Ref Range Status   Specimen Description BLOOD RIGHT HAND  Final   Special Requests BOTTLES DRAWN AEROBIC ONLY 5CC  Final   Culture  Setup Time   Final    11/15/2013 00:58 Performed at [facility]    Culture   Final           BLOOD CULTURE RECEIVED NO GROWTH TO DATE CULTURE WILL BE HELD FOR 5 DAYS BEFORE ISSUING A FINAL NEGATIVE REPORT Performed at [facility]    Report Status PENDING  Incomplete  Culture, blood (routine x 2)     Status: None (Preliminary result)   Collection Time: 11/17/13  2:20 PM  Result Value Ref Range Status   Specimen Description BLOOD RIGHT HAND  Final   Special Requests BOTTLES DRAWN AEROBIC AND ANAEROBIC 10CC  Final   Culture  Setup Time   Final    11/17/2013 22:09 Performed at [facility]    Culture   Final           BLOOD CULTURE RECEIVED NO GROWTH TO DATE CULTURE WILL BE HELD FOR 5 DAYS BEFORE ISSUING A FINAL NEGATIVE REPORT Performed at [facility]    Report  Status PENDING  Incomplete  Culture, blood (routine x 2)     Status: None (Preliminary result)   Collection Time: 11/17/13  2:24 PM  Result Value Ref Range Status   Specimen Description BLOOD LEFT HAND  Final   Special Requests BOTTLES DRAWN AEROBIC AND ANAEROBIC 10CC  Final   Culture  Setup Time   Final    11/17/2013 22:10 Performed at [facility]    Culture   Final           BLOOD CULTURE RECEIVED NO GROWTH TO DATE CULTURE WILL BE HELD FOR 5 DAYS BEFORE ISSUING A FINAL NEGATIVE REPORT Performed at [facility]    Report Status PENDING  Incomplete  Body fluid culture     Status: None (Preliminary result)   Collection Time: 11/17/13  6:36 PM  Result Value Ref Range Status   Specimen Description SYNOVIAL FLUID KNEE LEFT  Final   Special Requests Normal  Final   Gram Stain   Final    ABUNDANT WBC PRESENT, PREDOMINANTLY PMN NO ORGANISMS SEEN Performed at [facility]    Culture NO GROWTH Performed at [facility]   Final   Report Status PENDING  Incomplete    Radiology Reports    Ct Head (brain) Wo Contrast  11/14/2013   CLINICAL DATA:  63 year old male with history of prior stroke and 2 seizures this morning. Code stroke.  EXAM: CT HEAD WITHOUT CONTRAST  TECHNIQUE: Contiguous axial images were obtained from the base of the skull through the vertex without intravenous contrast.  COMPARISON:  Recent head CT and brain MRI 11/06/2013  FINDINGS:  Negative for acute intracranial hemorrhage, acute infarction, mass, mass effect, hydrocephalus or midline shift. Gray-white differentiation is preserved throughout. Hypoattenuation in the right centrum semiovale consistent with previously identified focal infarct. Chronic microvascular ischemic white matter disease. Remote lacunar infarcts in the right thalamus. No focal soft tissue or calvarial abnormality. Globes and orbits are symmetric bilaterally. Normal aeration of the mastoid air cells and paranasal sinuses.  Atherosclerotic calcifications present within both carotid arteries.  IMPRESSION: 1. No acute intracranial abnormality. 2. No significant interval change in the appearance of the brain compared to recent prior imaging. These results will be called to the ordering clinician or representative by the Radiologist Assistant, and communication documented in the PACS or zVision Dashboard.   Electronically Signed   By: Malachy MoanHeath  McCullough M.D.   On: 11/14/2013 13:16      Mr Brain Wo Contrast  11/15/2013   ADDENDUM REPORT: 11/15/2013 13:12  ADDENDUM: Abnormal and likely obstructed flow in the left vertebral artery is unchanged since the September study.   Electronically Signed   By: Gennette Pachris  Mattern M.D.   On: 11/15/2013 13:12   11/15/2013   CLINICAL DATA:  Recurrent seizure disorder. Recent admission for CVA.  EXAM: MRI HEAD WITHOUT CONTRAST  TECHNIQUE: Multiplanar, multiecho pulse sequences of the brain and surrounding structures were obtained without intravenous contrast.  COMPARISON:  CT head 11/14/2013 and MRI brain 11/06/2013.  FINDINGS: The diffusion-weighted images again shape the area of subacute infarction within the right coronal radiata. No new areas of infarct are evident.  Remote infarcts are again seen within the cerebellum. The remote lacunar infarcts of the brainstem and left midbrain are stable. Atrophy and periventricular white matter changes bilaterally are stable. No acute hemorrhage or mass lesion is evident. The ventricles are proportionate to the degree of atrophy.  Abnormal signal is evident within the left vertebral artery. Flow is present in the remainder of the intracranial circulation. The globes and orbits are intact. Minimal posterior left ethmoid sinus disease is present. The remaining paranasal sinuses and the mastoid air cells are clear.  Dedicated imaging of the temporal lobes demonstrates asymmetric decreased size of the right hippocampus. There is enlargement of the right temporal tip.   IMPRESSION: 1. Expected evolution of white matter infarct within the right corona radiata. 2. Multiple remote lacunar infarcts in extensive white matter disease. This likely reflects the sequela of chronic microvascular ischemia. 3. Asymmetric atrophy of the right hippocampal structure. This may be the source of seizures or related to chronic seizure activity emanating from the right temporal lobe.  Electronically Signed: By: Gennette Pachris  Mattern M.D. On: 11/15/2013 13:02      Dg Chest Port 1 View  11/14/2013   CLINICAL DATA:  Leukocytosis.  EXAM: PORTABLE CHEST - 1 VIEW  COMPARISON:  11/06/2013.  FINDINGS: Normal sized heart. Mild patchy opacity in the lower half of the right lung. Clear left lung. Mild thoracic spine degenerative changes.  IMPRESSION: Mild patchy opacity in the lower half of the right lung, most likely in the right lower lobe and primarily in the perihilar regions. This is most compatible with pneumonia.   Electronically Signed   By: Gordan PaymentSteve  Reid M.D.   On: 11/14/2013 15:56     CBC  Recent Labs Lab 11/14/13 1254 11/15/13 0325 11/17/13 0841 11/18/13 0934 11/19/13 0525  WBC 18.3* 15.9* 13.0* 14.5* 14.5*  HGB 14.9 14.3 13.6 13.8 13.7  HCT 41.6 39.7 38.3* 38.4* 37.5*  PLT 298 261 310 298 272  MCV 92.7 93.4  92.7 91.2 91.5  MCH 33.2 33.6 32.9 32.8 33.4  MCHC 35.8 36.0 35.5 35.9 36.5*  RDW 12.7 13.1 12.9 12.9 13.1  LYMPHSABS 1.6  --   --   --   --   MONOABS 1.0  --   --   --   --   EOSABS 0.0  --   --   --   --   BASOSABS 0.0  --   --   --   --     Chemistries   Recent Labs Lab 11/14/13 1254 11/17/13 0841 11/18/13 0934 11/19/13 0525  NA 139 135* 133* 130*  K 3.6* 3.4* 3.8 4.3  CL 102 98 95* 94*  CO2 21 21 24 22   GLUCOSE 138* 152* 119* 103*  BUN 10 5* 5* 5*  CREATININE 0.76 0.63 0.67 0.61  CALCIUM 9.0 8.6 8.7 8.7  MG  --   --   --  1.9  AST 24  --   --   --   ALT 28  --   --   --   ALKPHOS 124*  --   --   --   BILITOT 0.4  --   --   --     ------------------------------------------------------------------------------------------------------------------ estimated creatinine clearance is 90.3 mL/min (by C-G formula based on Cr of 0.61). ------------------------------------------------------------------------------------------------------------------ No results for input(s): HGBA1C in the last 72 hours. ------------------------------------------------------------------------------------------------------------------ No results for input(s): CHOL, HDL, LDLCALC, TRIG, CHOLHDL, LDLDIRECT in the last 72 hours. ------------------------------------------------------------------------------------------------------------------ No results for input(s): TSH, T4TOTAL, T3FREE, THYROIDAB in the last 72 hours.  Invalid input(s): FREET3 ------------------------------------------------------------------------------------------------------------------ No results for input(s): VITAMINB12, FOLATE, FERRITIN, TIBC, IRON, RETICCTPCT in the last 72 hours.  Coagulation profile  Recent Labs Lab 11/14/13 1254  INR 1.18    No results for input(s): DDIMER in the last 72 hours.  Cardiac Enzymes No results for input(s): CKMB, TROPONINI, MYOGLOBIN in the last 168 hours.  Invalid input(s): CK ------------------------------------------------------------------------------------------------------------------ Invalid input(s): POCBNP     Time Spent in minutes   35   SINGH,PRASHANT K M.D on 11/19/2013 at 10:15 AM  Between 7am to 7pm - Pager - [phone]  After 7pm go to [email or web address] - password [email or web address]  And look for the night coverage person covering for me after hours  [facility] Office  [phone]

## 2013-11-19 NOTE — Progress Notes (Signed)
Subjective: No further documented seizures.  Patient remains confused.    Objective: Current vital signs: BP 126/87 mmHg  Pulse 93  Temp(Src) 98 F (36.7 C) (Oral)  Resp 18  Ht 5\' 8"  (1.727 m)  Wt 72.3 kg (159 lb 6.3 oz)  BMI 24.24 kg/m2  SpO2 97% Vital signs in last 24 hours: Temp:  [98 F (36.7 C)-100.4 F (38 C)] 98 F (36.7 C) (11/12 0948) Pulse Rate:  [86-97] 93 (11/12 0948) Resp:  [18] 18 (11/12 0948) BP: (126-168)/(74-99) 126/87 mmHg (11/12 0948) SpO2:  [97 %-100 %] 97 % (11/12 0948)  Intake/Output from previous day: 11/11 0701 - 11/12 0700 In: 120 [P.O.:120] Out: 800 [Urine:800] Intake/Output this shift: Total I/O In: -  Out: 600 [Urine:600] Nutritional status: Diet Heart  Neurologic Exam: Mental Status: Alert and awake. Avoids all questions that have to do with orientation. Speech fluent without evidence of aphasia. Does not follow commands. More interactive today.    Cranial Nerves: II: Discs flat bilaterally; Visual fields grossly normal, pupils equal, round, reactive to light and accommodation III,IV, VI: ptosis not present, extra-ocular motions intact bilaterally V,VII: smile symmetric, facial light touch sensation normal bilaterally VIII: hearing normal bilaterally IX,X: gag reflex present XI: bilateral shoulder shrug XII: midline tongue extension Motor: Moves all extremities against gravity spontaneously.  Deep Tendon Reflexes: 2+ and symmetric throughout  Lab Results: Basic Metabolic Panel:  Recent Labs Lab 11/14/13 1254 11/17/13 0841 11/18/13 0934 11/19/13 0525  NA 139 135* 133* 130*  K 3.6* 3.4* 3.8 4.3  CL 102 98 95* 94*  CO2 21 21 24 22   GLUCOSE 138* 152* 119* 103*  BUN 10 5* 5* 5*  CREATININE 0.76 0.63 0.67 0.61  CALCIUM 9.0 8.6 8.7 8.7  MG  --   --   --  1.9    Liver Function Tests:  Recent Labs Lab 11/14/13 1254  AST 24  ALT 28  ALKPHOS 124*  BILITOT 0.4  PROT 7.1  ALBUMIN 3.5   No results for input(s):  LIPASE, AMYLASE in the last 168 hours. No results for input(s): AMMONIA in the last 168 hours.  CBC:  Recent Labs Lab 11/14/13 1254 11/15/13 0325 11/17/13 0841 11/18/13 0934 11/19/13 0525  WBC 18.3* 15.9* 13.0* 14.5* 14.5*  NEUTROABS 15.7*  --   --   --   --   HGB 14.9 14.3 13.6 13.8 13.7  HCT 41.6 39.7 38.3* 38.4* 37.5*  MCV 92.7 93.4 92.7 91.2 91.5  PLT 298 261 310 298 272    Cardiac Enzymes: No results for input(s): CKTOTAL, CKMB, CKMBINDEX, TROPONINI in the last 168 hours.  Lipid Panel: No results for input(s): CHOL, TRIG, HDL, CHOLHDL, VLDL, LDLCALC in the last 168 hours.  CBG:  Recent Labs Lab 11/14/13 1306  GLUCAP 131*    Microbiology: Results for orders placed or performed during the hospital encounter of 11/14/13  MRSA PCR Screening     Status: None   Collection Time: 11/14/13  5:17 PM  Result Value Ref Range Status   MRSA by PCR NEGATIVE NEGATIVE Final    Comment:        The GeneXpert MRSA Assay (FDA approved for NASAL specimens only), is one component of a comprehensive MRSA colonization surveillance program. It is not intended to diagnose MRSA infection nor to guide or monitor treatment for MRSA infections.   Culture, blood (routine x 2)     Status: None (Preliminary result)   Collection Time: 11/14/13  6:49 PM  Result Value  Ref Range Status   Specimen Description BLOOD RIGHT HAND  Final   Special Requests BOTTLES DRAWN AEROBIC AND ANAEROBIC 5CC EACH  Final   Culture  Setup Time   Final    11/15/2013 00:57 Performed at [facility]    Culture   Final           BLOOD CULTURE RECEIVED NO GROWTH TO DATE CULTURE WILL BE HELD FOR 5 DAYS BEFORE ISSUING A FINAL NEGATIVE REPORT Performed at [facility]    Report Status PENDING  Incomplete  Culture, blood (routine x 2)     Status: None (Preliminary result)   Collection Time: 11/14/13  7:02 PM  Result Value Ref Range Status   Specimen Description BLOOD RIGHT HAND  Final   Special  Requests BOTTLES DRAWN AEROBIC ONLY 5CC  Final   Culture  Setup Time   Final    11/15/2013 00:58 Performed at [facility]    Culture   Final           BLOOD CULTURE RECEIVED NO GROWTH TO DATE CULTURE WILL BE HELD FOR 5 DAYS BEFORE ISSUING A FINAL NEGATIVE REPORT Performed at [facility]    Report Status PENDING  Incomplete  Culture, blood (routine x 2)     Status: None (Preliminary result)   Collection Time: 11/17/13  2:20 PM  Result Value Ref Range Status   Specimen Description BLOOD RIGHT HAND  Final   Special Requests BOTTLES DRAWN AEROBIC AND ANAEROBIC 10CC  Final   Culture  Setup Time   Final    11/17/2013 22:09 Performed at [facility]    Culture   Final           BLOOD CULTURE RECEIVED NO GROWTH TO DATE CULTURE WILL BE HELD FOR 5 DAYS BEFORE ISSUING A FINAL NEGATIVE REPORT Performed at [facility]    Report Status PENDING  Incomplete  Culture, blood (routine x 2)     Status: None (Preliminary result)   Collection Time: 11/17/13  2:24 PM  Result Value Ref Range Status   Specimen Description BLOOD LEFT HAND  Final   Special Requests BOTTLES DRAWN AEROBIC AND ANAEROBIC 10CC  Final   Culture  Setup Time   Final    11/17/2013 22:10 Performed at [facility]    Culture   Final           BLOOD CULTURE RECEIVED NO GROWTH TO DATE CULTURE WILL BE HELD FOR 5 DAYS BEFORE ISSUING A FINAL NEGATIVE REPORT Performed at [facility]    Report Status PENDING  Incomplete  Body fluid culture     Status: None (Preliminary result)   Collection Time: 11/17/13  6:36 PM  Result Value Ref Range Status   Specimen Description SYNOVIAL FLUID KNEE LEFT  Final   Special Requests Normal  Final   Gram Stain   Final    ABUNDANT WBC PRESENT, PREDOMINANTLY PMN NO ORGANISMS SEEN Performed at [facility]    Culture NO GROWTH Performed at [facility]   Final   Report Status PENDING  Incomplete    Coagulation Studies: No  results for input(s): LABPROT, INR in the last 72 hours.  Imaging: Dg Knee Complete 4 Views Left  11/17/2013   CLINICAL DATA:  Left knee swelling. Limited range of motion. Left knee pain.  EXAM: LEFT KNEE - COMPLETE 4+ VIEW  COMPARISON:  11/24/2009  FINDINGS: There is chondrocalcinosis. Joint space narrowing and spurring within  all 3 compartments. Large joint effusion noted. No fracture, subluxation or dislocation. Vascular calcifications throughout the posterior soft tissues.  IMPRESSION: Chondrocalcinosis and moderate degenerative changes. Large joint effusion. No acute bony abnormality.   Electronically Signed   By: Charlett NoseKevin  Dover M.D.   On: 11/17/2013 13:09    Medications:  I have reviewed the patient's current medications. Scheduled: . amLODipine  10 mg Oral Daily  . antiseptic oral rinse  7 mL Mouth Rinse q12n4p  . atorvastatin  40 mg Oral q1800  . aztreonam  1 g Intravenous Q8H  . carbamazepine  400 mg Oral BID  . carvedilol  3.125 mg Oral BID WC  . chlorhexidine  15 mL Mouth Rinse BID  . clopidogrel  75 mg Oral Daily  . colchicine  0.6 mg Oral BID  . enoxaparin (LOVENOX) injection  40 mg Subcutaneous Q24H  . lacosamide  200 mg Oral BID  . levETIRAcetam  1,500 mg Oral BID  . lisinopril  20 mg Oral Daily  . methylPREDNISolone (SOLU-MEDROL) injection  60 mg Intravenous Daily  . metroNIDAZOLE  500 mg Oral 3 times per day  . sodium chloride  3 mL Intravenous Q12H  . thiamine  100 mg Oral Daily  . vancomycin  1,000 mg Intravenous Q8H    Assessment/Plan: Seizures controlled.  Patient stable.  Tolerating increase in Vimpat.    Recommendations: 1.  Continue current AED therapy.     LOS: 5 days   Thana FarrLeslie Krishika Bugge, MD [facility] [phone] 11/19/2013  12:17 PM

## 2013-11-19 NOTE — Progress Notes (Signed)
ANTIBIOTIC CONSULT NOTE - FOLLOW UP  Pharmacy Consult for Vancomycin and Aztreonam Indication: aspiration pneumonia  Allergies  Allergen Reactions  . Orange Juice [Orange Oil] Diarrhea  . Penicillins Nausea And Vomiting    Tolerates Zosyn. "might have been an overdose" (01/22/2012)  . Vicodin [Hydrocodone-Acetaminophen] Other (See Comments)    "triggered seizure both here and at home when he tried to take it" (01/22/2012)    Patient Measurements: Height: 5\' 8"  (172.7 cm) Weight: 159 lb 6.3 oz (72.3 kg) IBW/kg (Calculated) : 68.4  Vital Signs: Temp: 100.2 F (37.9 C) (11/12 1344) Temp Source: Axillary (11/12 1344) BP: 129/67 mmHg (11/12 1344) Pulse Rate: 100 (11/12 1344) Intake/Output from previous day: 11/11 0701 - 11/12 0700 In: 120 [P.O.:120] Out: 800 [Urine:800] Intake/Output from this shift: Total I/O In: 200 [P.O.:200] Out: 600 [Urine:600]  Labs:  Recent Labs  11/17/13 0841 11/18/13 0934 11/19/13 0525  WBC 13.0* 14.5* 14.5*  HGB 13.6 13.8 13.7  PLT 310 298 272  CREATININE 0.63 0.67 0.61   Estimated Creatinine Clearance: 90.3 mL/min (by C-G formula based on Cr of 0.61).  Recent Labs  11/19/13 0525  VANCOTROUGH 9.9*   Assessment:   Day # 6 antibiotics.  Off Vanc 11/9, then resumed on 11/10.   Vanc trough level prior to this morning's dose was 9.9 mcg/ml.  Below target for aspiration pneumonia.    Tmax 100.2, WBC still 14.5.   Left knee tapped last night; no organisms on gram stain, no growth in culture so far. Solumedrol and colchicine added for pseudogout in left knee.    Goal of Therapy:  Vancomycin trough level 15-20 mcg/ml  Plan:    Increase Vancomycin from 750 mg to 1 gram IV q8hrs.  Continue Aztreonam 1 gram IV q8hrs.  Also on Flagyl 500 mg PO q8hrs.  Will follow renal function, culture data and progress.  Dennie Fettersgan, Chaney Maclaren Donovan, [place] Pager: [phone] 11/19/2013,2:39 PM

## 2013-11-19 NOTE — Progress Notes (Signed)
Patient ID: Tessie EkeFranklin C Vallone, male   DOB: 02/17/1950, 63 y.o.   MRN: [id number] Cultures negative this morning of the left knee. Microscopic examination consistent with pseudogout with calcium pyrophosphate crystals. Colchicine and/or allopurinol may be helpful in treatment.

## 2013-11-19 NOTE — Progress Notes (Signed)
Clinical Social Worker spoke with pt's wife, [name] via telephone and presented bed offers. [facility]. [facility]'s RN Liaison notified with pt/pt's wife contact information to follow up.   CSW continues to follow pt for support and to facilitate pt's discharge needs.   Derenda FennelBashira Elisia Stepp, MSW, LCSWA [phone] 11/19/2013 10:02 AM

## 2013-11-19 NOTE — Progress Notes (Signed)
Occupational Therapy Treatment Patient Details Name: Lee [name] MRN: [id number] DOB: 09/20/1950 Today's Date: 11/19/2013    History of present illness 63 y.o. male with PMH of HTN, PAD, Epilepsy, recurrent CVAs (residual L sided weakness) and probable vascular dementia presents with seizures. Pt was noted by family to have a blank stare and subsequently looked stiffening then generalized clonic activity for about 1 minute, then had another episode lasting 3-5 minutes, and one more episode in EMS. Just discharged 11/2 with CVA and seizures. CT head-(-). MRI (-). CXR- Mild patchy opacity in the lower half of the right lung, compatible with PNA.    OT comments  Pt demonstrating signs of significant pain to knees upon light touch. Continued to work on breaking extensor tone while seated EOB during session. Pt requiring total +2 assistance for bed mobility and max +2 (A) to maintain static standing balance. Pt unable to complete grooming task at bed level. Pt's wife arrived at end of session and pt became increasingly more alert and initiated slight motion in BUE in an attempt to dry face.  Follow Up Recommendations  SNF;Supervision/Assistance - 24 hour    Equipment Recommendations  Wheelchair (measurements OT);Wheelchair cushion (measurements OT);Hospital bed    Recommendations for Other Services      Precautions / Restrictions Precautions Precautions: Fall Precaution Comments: full body extension Restrictions Weight Bearing Restrictions: No       Mobility Bed Mobility Overal bed mobility: Needs Assistance Bed Mobility: Supine to Sit;Sit to Supine     Supine to sit: Total assist;+2 for physical assistance Sit to supine: Total assist;+2 for physical assistance   General bed mobility comments: Total +2 (A) to swing legs off bed and to support trunk in upright position. Verbal and tactile cues for hand placement on bedrail. Handheld assist to come to full static sitting position  at EOB.   Transfers                 General transfer comment: Unsafe to attempt today due to significant extensor tone and pain in BLE.    Balance Overall balance assessment: Needs assistance Sitting-balance support: Bilateral upper extremity supported;Feet supported Sitting balance-Leahy Scale: Zero Sitting balance - Comments: Required tot +2 (A) to maintain static sitting balance and midline position at EOB. Pt demonstrating significant left lateral lean and unable to correct with verbal and tactile cues and manual (A).   Postural control: Left lateral lean                         ADL Overall ADL's : Needs assistance/impaired     Grooming: Wash/dry face;Maximal assistance;Cueing for sequencing;Bed level Grooming Details (indicate cue type and reason): Unable to bring wash cloth to face despite verbal and tactile cues. Pt responded indicating he understood the command, but unable to initiate movement claiming his "arms hurt." Max (A) to complete, pt groaned/grimaced saying warm wash cloth was "too cold" and "it's so painful." Pt began to initiate movement with BUE when told to dry face so it would not feel as cold, however still unable to bring towel to face.                               General ADL Comments: Pt sat EOB for ~ 5 minutes with max +1 (A) to stay in static sitting position. Pt unable to complete grooming task in supine and required max (A) to  complete (? reason: dementia, pain). Pt unsafe to transfer or attempt sit<>stand due to extensor tone and significant pain in BLE from gout.      Vision                     Perception     Praxis      Cognition     Overall Cognitive Status: History of cognitive impairments - at baseline Area of Impairment: Orientation;Attention;Memory;Following commands Orientation Level: Disoriented to;Place;Time;Situation Current Attention Level: Sustained Memory: Decreased short-term memory  Following  Commands: Follows one step commands inconsistently;Follows one step commands with increased time       General Comments: Pt unable to state month/year and where he was. Pt verbalizing location of pain, but question accuracy as he states everywhere hurts when touched.    Extremity/Trunk Assessment               Exercises     Shoulder Instructions       General Comments      Pertinent Vitals/ Pain       Faces Pain Scale: Hurts whole lot Pain Location: facial grimaces and groaning during BLE movement and knee flexion Pain Descriptors / Indicators: Grimacing Pain Intervention(s): Monitored during session;Premedicated before session;Repositioned;Limited activity within patient's tolerance  Home Living                                          Prior Functioning/Environment              Frequency Min 2X/week     Progress Toward Goals  OT Goals(current goals can now be found in the care plan section)  Progress towards OT goals: Not progressing toward goals - comment  Acute Rehab OT Goals Patient Stated Goal: none stated OT Goal Formulation: Patient unable to participate in goal setting Time For Goal Achievement: 12/01/13 Potential to Achieve Goals: Fair ADL Goals Pt Will Perform Grooming: with min assist;sitting Additional ADL Goal #1: Pt will tolerate static sitting with knees in flexion for 5 minutes min (A) Additional ADL Goal #2: Pt will follow 1 step command 75% of session  Plan Discharge plan remains appropriate    Co-evaluation                 End of Session     Activity Tolerance Patient limited by pain   Patient Left in bed;with call bell/phone within reach;with bed alarm set;with family/visitor present   Nurse Communication Mobility status;Precautions        Time: 2536-6440 OT Time Calculation (min): 27 min  Charges:    Nils Pyle 11/19/2013, 1:08 PM

## 2013-11-20 LAB — URINE CULTURE
COLONY COUNT: NO GROWTH
CULTURE: NO GROWTH

## 2013-11-20 LAB — BASIC METABOLIC PANEL
Anion gap: 13 (ref 5–15)
BUN: 8 mg/dL (ref 6–23)
CALCIUM: 8.4 mg/dL (ref 8.4–10.5)
CO2: 23 mEq/L (ref 19–32)
Chloride: 97 mEq/L (ref 96–112)
Creatinine, Ser: 0.61 mg/dL (ref 0.50–1.35)
GFR calc Af Amer: >90 mL/min (ref >90)
GLUCOSE: 100 mg/dL — AB (ref 70–99)
Potassium: 4 mEq/L (ref 3.7–5.3)
Sodium: 133 mEq/L — ABNORMAL LOW (ref 137–147)

## 2013-11-20 MED ORDER — METHYLPREDNISOLONE 4 MG PO KIT: PACK | ORAL | Status: DC

## 2013-11-20 MED ORDER — METRONIDAZOLE 500 MG PO TABS: 500.0000 mg | ORAL_TABLET | Freq: Three times a day (TID) | ORAL | Status: DC

## 2013-11-20 MED ORDER — DIAZEPAM 2.5 MG RE GEL: 2.5000 mg | Freq: Four times a day (QID) | RECTAL | Status: DC | PRN

## 2013-11-20 MED ORDER — LEVETIRACETAM 500 MG PO TABS
1500.0000 mg | ORAL_TABLET | Freq: Two times a day (BID) | ORAL | Status: DC
Start: 2013-11-20 — End: 2014-02-03

## 2013-11-20 MED ORDER — LACOSAMIDE 200 MG PO TABS: 200.0000 mg | ORAL_TABLET | Freq: Two times a day (BID) | ORAL | Status: DC

## 2013-11-20 MED ORDER — COLCHICINE 0.6 MG PO TABS
0.6000 mg | ORAL_TABLET | Freq: Two times a day (BID) | ORAL | Status: DC
Start: 2013-11-20 — End: 2017-07-31

## 2013-11-20 MED ORDER — AMLODIPINE BESYLATE 10 MG PO TABS: 10.0000 mg | ORAL_TABLET | Freq: Every day | ORAL | Status: DC

## 2013-11-20 MED ORDER — HYDROCODONE-ACETAMINOPHEN 5-325 MG PO TABS: 1.0000 | ORAL_TABLET | Freq: Four times a day (QID) | ORAL | Status: DC | PRN

## 2013-11-20 MED ORDER — LEVOFLOXACIN 750 MG PO TABS: 750.0000 mg | ORAL_TABLET | Freq: Every day | ORAL | Status: DC

## 2013-11-20 NOTE — Progress Notes (Signed)
Clinical Social Worker facilitated patient discharge including contacting patient family and facility to confirm patient discharge plans.  Clinical information faxed to facility and family agreeable with plan.  CSW [facility]  RN to call report prior to discharge.  Clinical Social Worker will sign off for now as social work intervention is no longer needed. Please consult us again if new need arises.  Derenda FennelBashira Korena Nass, MSW, LCSWA [phone] 11/20/2013 10:24 AM

## 2013-11-20 NOTE — Progress Notes (Signed)
Physical Therapy Treatment Patient Details Name: Lee [name] MRN: [id number] DOB: 07/19/1950 Today's Date: 11/20/2013    History of Present Illness 63 y.o. male with PMH of HTN, PAD, Epilepsy, recurrent CVAs (residual L sided weakness) and probable vascular dementia presents with seizures. Pt was noted by family to have a blank stare and subsequently looked stiffening then generalized clonic activity for about 1 minute, then had another episode lasting 3-5 minutes, and one more episode in EMS. Just discharged 11/2 with CVA and seizures. CT head-(-). MRI (-). CXR- Mild patchy opacity in the lower half of the right lung, compatible with PNA.     PT Comments    Patient able to progress and transfer to recliner this session. Patients great improved with bed mobility and sitting balance. Patient able to follow one step commands well and converse more appropriately with therapist today. Continue to recommend SNF for ongoing Physical Therapy.     Follow Up Recommendations  SNF;Supervision/Assistance - 24 hour     Equipment Recommendations       Recommendations for Other Services       Precautions / Restrictions Precautions Precautions: Fall Restrictions Weight Bearing Restrictions: No    Mobility  Bed Mobility Overal bed mobility: Needs Assistance Bed Mobility: Supine to Sit;Sit to Supine     Supine to sit: Min assist     General bed mobility comments: Min A with use of rails. Patient able to lift trunk support up into sitting and required Min A for LEs with increased time  Transfers Overall transfer level: Needs assistance Equipment used: 2 person hand held assist       Squat pivot transfers: +2 physical assistance;Max assist     General transfer comment: +2 assist for all aspects of transfer to recliner. Patient able to lean forward and put weight through LEs. Attempted use of steady initially but unable to stand fully upright due to pain  Ambulation/Gait                 Stairs            Wheelchair Mobility    Modified Rankin (Stroke Patients Only)       Balance     Sitting balance-Leahy Scale: Fair Sitting balance - Comments: Patient able to sit EOB ~7 mins with hand in lap with no outside assistance.      Standing balance-Leahy Scale: Zero                      Cognition Arousal/Alertness: Awake/alert Behavior During Therapy: WFL for tasks assessed/performed Overall Cognitive Status: History of cognitive impairments - at baseline Area of Impairment: Orientation;Attention;Memory;Following commands   Current Attention Level: Sustained Memory: Decreased short-term memory Following Commands: Follows one step commands consistently       General Comments: Pt unable to state month/year and where he was.     Exercises      General Comments        Pertinent Vitals/Pain Faces Pain Scale: Hurts little more Pain Location: facial grimaces with movement of LEs Pain Descriptors / Indicators: Grimacing Pain Intervention(s): Limited activity within patient's tolerance;Monitored during session;Repositioned    Home Living                      Prior Function            PT Goals (current goals can now be found in the care plan section) Progress towards PT goals: Progressing toward goals  Frequency  Min 3X/week    PT Plan Current plan remains appropriate    Co-evaluation             End of Session Equipment Utilized During Treatment: Gait belt Activity Tolerance: Patient limited by pain;Patient tolerated treatment well Patient left: in chair;with call bell/phone within reach;with chair alarm set     Time: 0902-0930 PT Time Calculation (min) (ACUTE ONLY): 28 min  Charges:  $Therapeutic Activity: 23-37 mins                    G Codes:      Fredrich BirksRobinette, Julia Elizabeth 11/20/2013, 9:43 AM 11/20/2013 Fredrich Birksobinette, Julia Elizabeth PTA [phone] pager [phone] office

## 2013-11-20 NOTE — Discharge Instructions (Signed)
Follow with Primary MD Lee [name],Lee J, MD or SNF MD in 7 days   Get CBC, CMP, 2 view Chest X ray checked  by Primary MD next visit.    Activity: As tolerated with Full fall precautions use walker/cane & assistance as needed   Disposition SNF   Diet: Heart Healthy   with feeding assistance and aspiration precautions as needed.  For Heart failure patients - Check your Weight same time everyday, if you gain over 2 pounds, or you develop in leg swelling, experience more shortness of breath or chest pain, call your Primary MD immediately. Follow Cardiac Low Salt Diet and 1.8 lit/day fluid restriction.   On your next visit with your primary care physician please Get Medicines reviewed and adjusted.   Please request your Prim.MD to go over all Hospital Tests and Procedure/Radiological results at the follow up, please get all Hospital records sent to your Prim MD by signing hospital release before you go home.   If you experience worsening of your admission symptoms, develop shortness of breath, life threatening emergency, suicidal or homicidal thoughts you must seek medical attention immediately by calling 911 or calling your MD immediately  if symptoms less severe.  You Must read complete instructions/literature along with all the possible adverse reactions/side effects for all the Medicines you take and that have been prescribed to you. Take any new Medicines after you have completely understood and accpet all the possible adverse reactions/side effects.   Do not drive, operating heavy machinery, perform activities at heights, swimming or participation in water activities or provide baby sitting services if your were admitted for syncope or siezures until you have seen by Primary MD or a Neurologist and advised to do so again.  Do not drive when taking Pain medications.    Do not take more than prescribed Pain, Sleep and Anxiety Medications  Special Instructions: If you have smoked or  chewed Tobacco  in the last 2 yrs please stop smoking, stop any regular Alcohol  and or any Recreational drug use.  Wear Seat belts while driving.   Please note  You were cared for by a hospitalist during your hospital stay. If you have any questions about your discharge medications or the care you received while you were in the hospital after you are discharged, you can call the unit and asked to speak with the hospitalist on call if the hospitalist that took care of you is not available. Once you are discharged, your primary care physician will handle any further medical issues. Please note that NO REFILLS for any discharge medications will be authorized once you are discharged, as it is imperative that you return to your primary care physician (or establish a relationship with a primary care physician if you do not have one) for your aftercare needs so that they can reassess your need for medications and monitor your lab values.
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
## 2013-11-20 NOTE — Progress Notes (Signed)
Pt discharged at this time [facility]. No noted distress. IV discontinued dry dressing. [facility].

## 2013-11-20 NOTE — Progress Notes (Addendum)
Subjective: Patient seizure free.  On Vimpat, Tegretol and Keppra.  Objective: Current vital signs: BP 138/77 mmHg  Pulse 81  Temp(Src) 98.2 F (36.8 C) (Oral)  Resp 18  Ht 5\' 8"  (1.727 m)  Wt 72.3 kg (159 lb 6.3 oz)  BMI 24.24 kg/m2  SpO2 99% Vital signs in last 24 hours: Temp:  [98.1 F (36.7 C)-100.2 F (37.9 C)] 98.2 F (36.8 C) (11/13 0557) Pulse Rate:  [81-100] 81 (11/13 0557) Resp:  [18] 18 (11/13 0557) BP: (120-149)/(67-81) 138/77 mmHg (11/13 0557) SpO2:  [96 %-99 %] 99 % (11/13 0557)  Intake/Output from previous day: 11/12 0701 - 11/13 0700 In: 200 [P.O.:200] Out: 1000 [Urine:1000] Intake/Output this shift: Total I/O In: -  Out: 1000 [Urine:1000] Nutritional status: Diet Heart  Neurologic Exam: Mental Status: Alert and awake.Speech fluent without evidence of aphasia. Does not follow commands. More interactive today.   Cranial Nerves: II: Discs flat bilaterally; Visual fields grossly normal, pupils equal, round, reactive to light and accommodation III,IV, VI: ptosis not present, extra-ocular motions intact bilaterally V,VII: smile symmetric, facial light touch sensation normal bilaterally VIII: hearing normal bilaterally IX,X: gag reflex present XI: bilateral shoulder shrug XII: midline tongue extension Motor: Moves all extremities against gravity spontaneously.  Deep Tendon Reflexes: 2+ and symmetric throughout  Lab Results: Basic Metabolic Panel:  Recent Labs Lab 11/14/13 1254 11/17/13 0841 11/18/13 0934 11/19/13 0525 11/20/13 0504  NA 139 135* 133* 130* 133*  K 3.6* 3.4* 3.8 4.3 4.0  CL 102 98 95* 94* 97  CO2 21 21 24 22 23   GLUCOSE 138* 152* 119* 103* 100*  BUN 10 5* 5* 5* 8  CREATININE 0.76 0.63 0.67 0.61 0.61  CALCIUM 9.0 8.6 8.7 8.7 8.4  MG  --   --   --  1.9  --     Liver Function Tests:  Recent Labs Lab 11/14/13 1254  AST 24  ALT 28  ALKPHOS 124*  BILITOT 0.4  PROT 7.1  ALBUMIN 3.5   No results for input(s):  LIPASE, AMYLASE in the last 168 hours. No results for input(s): AMMONIA in the last 168 hours.  CBC:  Recent Labs Lab 11/14/13 1254 11/15/13 0325 11/17/13 0841 11/18/13 0934 11/19/13 0525  WBC 18.3* 15.9* 13.0* 14.5* 14.5*  NEUTROABS 15.7*  --   --   --   --   HGB 14.9 14.3 13.6 13.8 13.7  HCT 41.6 39.7 38.3* 38.4* 37.5*  MCV 92.7 93.4 92.7 91.2 91.5  PLT 298 261 310 298 272    Cardiac Enzymes: No results for input(s): CKTOTAL, CKMB, CKMBINDEX, TROPONINI in the last 168 hours.  Lipid Panel: No results for input(s): CHOL, TRIG, HDL, CHOLHDL, VLDL, LDLCALC in the last 168 hours.  CBG:  Recent Labs Lab 11/14/13 1306  GLUCAP 131*    Microbiology: Results for orders placed or performed during the hospital encounter of 11/14/13  MRSA PCR Screening     Status: None   Collection Time: 11/14/13  5:17 PM  Result Value Ref Range Status   MRSA by PCR NEGATIVE NEGATIVE Final    Comment:        The GeneXpert MRSA Assay (FDA approved for NASAL specimens only), is one component of a comprehensive MRSA colonization surveillance program. It is not intended to diagnose MRSA infection nor to guide or monitor treatment for MRSA infections.   Culture, blood (routine x 2)     Status: None (Preliminary result)   Collection Time: 11/14/13  6:49 PM  Result  Value Ref Range Status   Specimen Description BLOOD RIGHT HAND  Final   Special Requests BOTTLES DRAWN AEROBIC AND ANAEROBIC 5CC EACH  Final   Culture  Setup Time   Final    11/15/2013 00:57 Performed at [facility]    Culture   Final           BLOOD CULTURE RECEIVED NO GROWTH TO DATE CULTURE WILL BE HELD FOR 5 DAYS BEFORE ISSUING A FINAL NEGATIVE REPORT Performed at [facility]    Report Status PENDING  Incomplete  Culture, blood (routine x 2)     Status: None (Preliminary result)   Collection Time: 11/14/13  7:02 PM  Result Value Ref Range Status   Specimen Description BLOOD RIGHT HAND  Final   Special  Requests BOTTLES DRAWN AEROBIC ONLY 5CC  Final   Culture  Setup Time   Final    11/15/2013 00:58 Performed at [facility]    Culture   Final           BLOOD CULTURE RECEIVED NO GROWTH TO DATE CULTURE WILL BE HELD FOR 5 DAYS BEFORE ISSUING A FINAL NEGATIVE REPORT Performed at [facility]    Report Status PENDING  Incomplete  Culture, blood (routine x 2)     Status: None (Preliminary result)   Collection Time: 11/17/13  2:20 PM  Result Value Ref Range Status   Specimen Description BLOOD RIGHT HAND  Final   Special Requests BOTTLES DRAWN AEROBIC AND ANAEROBIC 10CC  Final   Culture  Setup Time   Final    11/17/2013 22:09 Performed at [facility]    Culture   Final           BLOOD CULTURE RECEIVED NO GROWTH TO DATE CULTURE WILL BE HELD FOR 5 DAYS BEFORE ISSUING A FINAL NEGATIVE REPORT Performed at [facility]    Report Status PENDING  Incomplete  Culture, blood (routine x 2)     Status: None (Preliminary result)   Collection Time: 11/17/13  2:24 PM  Result Value Ref Range Status   Specimen Description BLOOD LEFT HAND  Final   Special Requests BOTTLES DRAWN AEROBIC AND ANAEROBIC 10CC  Final   Culture  Setup Time   Final    11/17/2013 22:10 Performed at [facility]    Culture   Final           BLOOD CULTURE RECEIVED NO GROWTH TO DATE CULTURE WILL BE HELD FOR 5 DAYS BEFORE ISSUING A FINAL NEGATIVE REPORT Performed at [facility]    Report Status PENDING  Incomplete  Body fluid culture     Status: None (Preliminary result)   Collection Time: 11/17/13  6:36 PM  Result Value Ref Range Status   Specimen Description SYNOVIAL FLUID KNEE LEFT  Final   Special Requests Normal  Final   Gram Stain   Final    ABUNDANT WBC PRESENT, PREDOMINANTLY PMN NO ORGANISMS SEEN Performed at [facility]    Culture   Final    NO GROWTH 2 DAYS Performed at [facility]    Report Status PENDING  Incomplete    Coagulation  Studies: No results for input(s): LABPROT, INR in the last 72 hours.  Imaging: Dg Chest 2 View  11/19/2013   CLINICAL DATA:  Cough.  EXAM: CHEST  2 VIEW  COMPARISON:  11/17/2013  FINDINGS: Limited lateral imaging due to arms down position.  A medial right base opacity is  less apparent on today's study. No edema, effusion, or pneumothorax.  Normal heart size and mediastinal contours.  IMPRESSION: Right basilar aeration has improved since 11/14/2013.   Electronically Signed   By: Tiburcio PeaJonathan  Watts M.D.   On: 11/19/2013 15:36    Medications:  I have reviewed the patient's current medications. Scheduled: . amLODipine  10 mg Oral Daily  . antiseptic oral rinse  7 mL Mouth Rinse q12n4p  . atorvastatin  40 mg Oral q1800  . aztreonam  1 g Intravenous Q8H  . carbamazepine  400 mg Oral BID  . carvedilol  3.125 mg Oral BID WC  . chlorhexidine  15 mL Mouth Rinse BID  . clopidogrel  75 mg Oral Daily  . colchicine  0.6 mg Oral BID  . enoxaparin (LOVENOX) injection  40 mg Subcutaneous Q24H  . lacosamide  200 mg Oral BID  . levETIRAcetam  1,500 mg Oral BID  . lisinopril  20 mg Oral Daily  . methylPREDNISolone (SOLU-MEDROL) injection  60 mg Intravenous Daily  . metroNIDAZOLE  500 mg Oral 3 times per day  . sodium chloride  3 mL Intravenous Q12H  . thiamine  100 mg Oral Daily  . vancomycin  1,000 mg Intravenous Q8H    Assessment/Plan: Patient stable.  No further seizures  Recommendations: 1.  Continue Vimpat, Tegretol and Keppra at current doses.     LOS: 6 days   Thana FarrLeslie Shavonne Ambroise, MD [facility] [phone] 11/20/2013  11:04 AM

## 2013-11-21 LAB — BODY FLUID CULTURE
Culture: NO GROWTH
Special Requests: NORMAL

## 2013-11-21 LAB — CULTURE, BLOOD (ROUTINE X 2)
Culture: NO GROWTH
Culture: NO GROWTH

## 2013-11-23 LAB — CULTURE, BLOOD (ROUTINE X 2)
CULTURE: NO GROWTH
Culture: NO GROWTH

## 2013-11-27 ENCOUNTER — Encounter: Payer: Self-pay | Admitting: Family

## 2013-11-30 ENCOUNTER — Encounter (HOSPITAL_COMMUNITY): Payer: Medicare Other

## 2013-11-30 ENCOUNTER — Other Ambulatory Visit (HOSPITAL_COMMUNITY): Payer: Medicare Other

## 2013-11-30 ENCOUNTER — Ambulatory Visit: Payer: Medicare Other | Admitting: Family

## 2013-12-17 ENCOUNTER — Encounter (HOSPITAL_COMMUNITY): Payer: Self-pay | Admitting: Cardiovascular Disease

## 2014-01-09 ENCOUNTER — Other Ambulatory Visit: Payer: Self-pay | Admitting: Diagnostic Neuroimaging

## 2014-01-11 DIAGNOSIS — I739 Peripheral vascular disease, unspecified: Secondary | ICD-10-CM | POA: Diagnosis not present

## 2014-01-12 ENCOUNTER — Telehealth: Payer: Self-pay | Admitting: *Deleted

## 2014-01-12 DIAGNOSIS — R262 Difficulty in walking, not elsewhere classified: Secondary | ICD-10-CM | POA: Diagnosis not present

## 2014-01-12 DIAGNOSIS — M6281 Muscle weakness (generalized): Secondary | ICD-10-CM | POA: Diagnosis not present

## 2014-01-12 DIAGNOSIS — G9340 Encephalopathy, unspecified: Secondary | ICD-10-CM | POA: Diagnosis not present

## 2014-01-12 NOTE — Telephone Encounter (Signed)
Rx was already sent on 01/03.  I called the pharmacy.  Spoke with Tamika.  She said Rx is ready for pick up.  I called patient back, got no answer.  Left message.

## 2014-01-12 NOTE — Telephone Encounter (Signed)
Patient calling for Carbamazepine (EPITOL) 200 MG tablet, called into [facility] on [address], patient has 2 days left.

## 2014-01-19 DIAGNOSIS — G40909 Epilepsy, unspecified, not intractable, without status epilepticus: Secondary | ICD-10-CM | POA: Diagnosis not present

## 2014-01-19 DIAGNOSIS — I10 Essential (primary) hypertension: Secondary | ICD-10-CM | POA: Diagnosis not present

## 2014-01-19 DIAGNOSIS — J449 Chronic obstructive pulmonary disease, unspecified: Secondary | ICD-10-CM | POA: Diagnosis not present

## 2014-01-19 DIAGNOSIS — R634 Abnormal weight loss: Secondary | ICD-10-CM | POA: Diagnosis not present

## 2014-01-19 DIAGNOSIS — I6931 Cognitive deficits following cerebral infarction: Secondary | ICD-10-CM | POA: Diagnosis not present

## 2014-01-20 ENCOUNTER — Ambulatory Visit: Payer: Medicare Other | Admitting: Diagnostic Neuroimaging

## 2014-02-03 ENCOUNTER — Ambulatory Visit (INDEPENDENT_AMBULATORY_CARE_PROVIDER_SITE_OTHER): Payer: Medicare Other | Admitting: Diagnostic Neuroimaging

## 2014-02-03 ENCOUNTER — Encounter: Payer: Self-pay | Admitting: Diagnostic Neuroimaging

## 2014-02-03 VITALS — BP 123/78 | HR 66 | Temp 97.8°F | Ht 68.0 in | Wt 162.8 lb

## 2014-02-03 DIAGNOSIS — G40109 Localization-related (focal) (partial) symptomatic epilepsy and epileptic syndromes with simple partial seizures, not intractable, without status epilepticus: Secondary | ICD-10-CM

## 2014-02-03 NOTE — Patient Instructions (Signed)
Continue current medications. 
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
## 2014-02-11 DIAGNOSIS — I739 Peripheral vascular disease, unspecified: Secondary | ICD-10-CM | POA: Diagnosis not present

## 2014-03-05 ENCOUNTER — Encounter: Payer: Self-pay | Admitting: Family

## 2014-03-08 ENCOUNTER — Ambulatory Visit: Payer: Medicare Other | Admitting: Family

## 2014-03-08 ENCOUNTER — Ambulatory Visit (HOSPITAL_COMMUNITY): Payer: Medicare Other

## 2014-03-12 DIAGNOSIS — I739 Peripheral vascular disease, unspecified: Secondary | ICD-10-CM | POA: Diagnosis not present

## 2014-03-16 DIAGNOSIS — I10 Essential (primary) hypertension: Secondary | ICD-10-CM | POA: Diagnosis not present

## 2014-03-16 DIAGNOSIS — G40901 Epilepsy, unspecified, not intractable, with status epilepticus: Secondary | ICD-10-CM | POA: Diagnosis not present

## 2014-03-22 ENCOUNTER — Telehealth: Payer: Self-pay | Admitting: *Deleted

## 2014-03-22 ENCOUNTER — Telehealth: Payer: Self-pay | Admitting: Diagnostic Neuroimaging

## 2014-03-22 ENCOUNTER — Emergency Department (HOSPITAL_COMMUNITY)
Admission: EM | Admit: 2014-03-22 | Discharge: 2014-03-22 | Disposition: A | Discharge status: Home / Self Care | Payer: Medicare Other | Attending: Emergency Medicine | Admitting: Emergency Medicine

## 2014-03-22 ENCOUNTER — Emergency Department (HOSPITAL_COMMUNITY): Payer: Medicare Other

## 2014-03-22 ENCOUNTER — Encounter (HOSPITAL_COMMUNITY): Payer: Self-pay | Admitting: Emergency Medicine

## 2014-03-22 DIAGNOSIS — G40909 Epilepsy, unspecified, not intractable, without status epilepticus: Secondary | ICD-10-CM | POA: Insufficient documentation

## 2014-03-22 DIAGNOSIS — Z87891 Personal history of nicotine dependence: Secondary | ICD-10-CM | POA: Insufficient documentation

## 2014-03-22 DIAGNOSIS — J449 Chronic obstructive pulmonary disease, unspecified: Secondary | ICD-10-CM | POA: Insufficient documentation

## 2014-03-22 DIAGNOSIS — Z8673 Personal history of transient ischemic attack (TIA), and cerebral infarction without residual deficits: Secondary | ICD-10-CM | POA: Insufficient documentation

## 2014-03-22 DIAGNOSIS — Z7902 Long term (current) use of antithrombotics/antiplatelets: Secondary | ICD-10-CM | POA: Insufficient documentation

## 2014-03-22 DIAGNOSIS — Z8739 Personal history of other diseases of the musculoskeletal system and connective tissue: Secondary | ICD-10-CM | POA: Diagnosis not present

## 2014-03-22 DIAGNOSIS — I10 Essential (primary) hypertension: Secondary | ICD-10-CM | POA: Insufficient documentation

## 2014-03-22 DIAGNOSIS — S0990XA Unspecified injury of head, initial encounter: Secondary | ICD-10-CM | POA: Diagnosis not present

## 2014-03-22 DIAGNOSIS — Z79899 Other long term (current) drug therapy: Secondary | ICD-10-CM | POA: Insufficient documentation

## 2014-03-22 DIAGNOSIS — Z880 Allergy status to penicillin: Secondary | ICD-10-CM | POA: Insufficient documentation

## 2014-03-22 DIAGNOSIS — Z87442 Personal history of urinary calculi: Secondary | ICD-10-CM | POA: Insufficient documentation

## 2014-03-22 DIAGNOSIS — R531 Weakness: Secondary | ICD-10-CM | POA: Diagnosis not present

## 2014-03-22 HISTORY — DX: Gout, unspecified: M10.9

## 2014-03-22 LAB — RAPID URINE DRUG SCREEN, HOSP PERFORMED
AMPHETAMINES: NOT DETECTED
Barbiturates: NOT DETECTED
Benzodiazepines: NOT DETECTED
Cocaine: NOT DETECTED
Opiates: NOT DETECTED
TETRAHYDROCANNABINOL: NOT DETECTED

## 2014-03-22 LAB — CBC WITH DIFFERENTIAL/PLATELET
BASOS ABS: 0 10*3/uL (ref 0.0–0.1)
Basophils Relative: 0 % (ref 0–1)
EOS PCT: 3 % (ref 0–5)
Eosinophils Absolute: 0.2 10*3/uL (ref 0.0–0.7)
HCT: 41.7 % (ref 39.0–52.0)
Hemoglobin: 14.3 g/dL (ref 13.0–17.0)
LYMPHS ABS: 2.1 10*3/uL (ref 0.7–4.0)
LYMPHS PCT: 28 % (ref 12–46)
MCH: 32.2 pg (ref 26.0–34.0)
MCHC: 34.3 g/dL (ref 30.0–36.0)
MCV: 93.9 fL (ref 78.0–100.0)
Monocytes Absolute: 0.6 10*3/uL (ref 0.1–1.0)
Monocytes Relative: 8 % (ref 3–12)
NEUTROS ABS: 4.6 10*3/uL (ref 1.7–7.7)
NEUTROS PCT: 61 % (ref 43–77)
PLATELETS: 245 10*3/uL (ref 150–400)
RBC: 4.44 MIL/uL (ref 4.22–5.81)
RDW: 14.6 % (ref 11.5–15.5)
WBC: 7.5 10*3/uL (ref 4.0–10.5)

## 2014-03-22 LAB — COMPREHENSIVE METABOLIC PANEL
ALBUMIN: 3.5 g/dL (ref 3.5–5.2)
ALK PHOS: 111 U/L (ref 39–117)
ALT: 39 U/L (ref 0–53)
ANION GAP: 5 (ref 5–15)
AST: 27 U/L (ref 0–37)
BILIRUBIN TOTAL: 0.3 mg/dL (ref 0.3–1.2)
BUN: 10 mg/dL (ref 6–23)
CALCIUM: 9 mg/dL (ref 8.4–10.5)
CO2: 31 mmol/L (ref 19–32)
Chloride: 98 mmol/L (ref 96–112)
Creatinine, Ser: 0.84 mg/dL (ref 0.50–1.35)
GFR calc non Af Amer: >90 mL/min (ref >90)
Glucose, Bld: 88 mg/dL (ref 70–99)
Potassium: 4.2 mmol/L (ref 3.5–5.1)
SODIUM: 134 mmol/L — AB (ref 135–145)
TOTAL PROTEIN: 6.4 g/dL (ref 6.0–8.3)

## 2014-03-22 LAB — APTT: aPTT: 32 seconds (ref 24–37)

## 2014-03-22 LAB — URINALYSIS, ROUTINE W REFLEX MICROSCOPIC
BILIRUBIN URINE: NEGATIVE
GLUCOSE, UA: NEGATIVE mg/dL
Hgb urine dipstick: NEGATIVE
KETONES UR: NEGATIVE mg/dL
LEUKOCYTES UA: NEGATIVE
Nitrite: NEGATIVE
PH: 7 (ref 5.0–8.0)
Protein, ur: NEGATIVE mg/dL
Specific Gravity, Urine: 1.022 (ref 1.005–1.030)
Urobilinogen, UA: 0.2 mg/dL (ref 0.0–1.0)

## 2014-03-22 LAB — TROPONIN I: Troponin I: <0.03 ng/mL (ref <0.031)

## 2014-03-22 LAB — ETHANOL

## 2014-03-22 MED ORDER — SODIUM CHLORIDE 0.9 % IV BOLUS (SEPSIS)
500.0000 mL | Freq: Once | INTRAVENOUS | Status: AC
  Administered 2014-03-22: 500 mL via INTRAVENOUS

## 2014-03-22 MED ORDER — SODIUM CHLORIDE 0.9 % IV SOLN: 100.0000 mL/h | INTRAVENOUS | Status: DC

## 2014-03-22 NOTE — Telephone Encounter (Signed)
Patient's wife is calling about husband.  When she went to get him out of car he started to walk to the left side. He stated his legs are throbbing.  He is getting his days mixed up also. Wife is very upset and would like to know if she needs to bring him in or go to emergency room as this is new behavior.  Thanks!

## 2014-03-22 NOTE — Telephone Encounter (Signed)
Spoke with the pt's wife and informed her that she needed to go to the ER for further evaluation. Asked her to follow-up with me later on and let me know how things were. She stated an understanding
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
## 2014-03-22 NOTE — Discharge Instructions (Signed)
As discussed, your evaluation today has been largely reassuring.  But, it is important that you monitor your condition carefully, and do not hesitate to return to the ED if you develop new, or concerning changes in your condition. ? ?Otherwise, please follow-up with your physician for appropriate ongoing care. ? ?

## 2014-03-22 NOTE — ED Notes (Signed)
Pt woke up this AM feeling normal. Goes to adult daycare for hx of stroke and seizures. Per wife this AM pt was falling while getting into and out of the car. No focal weakness noted. Pt disoriented to time, age. LSN 0700. Hx of left sided weakness. Denies pain. Recent cough. No fever.

## 2014-04-09 DIAGNOSIS — H524 Presbyopia: Secondary | ICD-10-CM | POA: Diagnosis not present

## 2014-04-09 DIAGNOSIS — H52222 Regular astigmatism, left eye: Secondary | ICD-10-CM | POA: Diagnosis not present

## 2014-04-09 DIAGNOSIS — H5203 Hypermetropia, bilateral: Secondary | ICD-10-CM | POA: Diagnosis not present

## 2014-04-09 DIAGNOSIS — H2513 Age-related nuclear cataract, bilateral: Secondary | ICD-10-CM | POA: Diagnosis not present

## 2014-04-12 DIAGNOSIS — I739 Peripheral vascular disease, unspecified: Secondary | ICD-10-CM | POA: Diagnosis not present

## 2014-05-05 ENCOUNTER — Encounter: Payer: Self-pay | Admitting: Diagnostic Neuroimaging

## 2014-05-05 ENCOUNTER — Ambulatory Visit (INDEPENDENT_AMBULATORY_CARE_PROVIDER_SITE_OTHER): Payer: Medicare Other | Admitting: Diagnostic Neuroimaging

## 2014-05-05 VITALS — BP 138/86 | HR 72 | Ht 68.0 in | Wt 174.2 lb

## 2014-05-05 DIAGNOSIS — G40109 Localization-related (focal) (partial) symptomatic epilepsy and epileptic syndromes with simple partial seizures, not intractable, without status epilepticus: Secondary | ICD-10-CM | POA: Diagnosis not present

## 2014-05-05 DIAGNOSIS — I6930 Unspecified sequelae of cerebral infarction: Secondary | ICD-10-CM

## 2014-05-05 DIAGNOSIS — Z8673 Personal history of transient ischemic attack (TIA), and cerebral infarction without residual deficits: Secondary | ICD-10-CM | POA: Diagnosis not present
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
## 2014-05-11 ENCOUNTER — Telehealth: Payer: Self-pay | Admitting: Diagnostic Neuroimaging

## 2014-05-11 DIAGNOSIS — I10 Essential (primary) hypertension: Secondary | ICD-10-CM | POA: Diagnosis not present

## 2014-05-11 DIAGNOSIS — I69920 Aphasia following unspecified cerebrovascular disease: Secondary | ICD-10-CM | POA: Diagnosis not present

## 2014-05-11 DIAGNOSIS — I999 Unspecified disorder of circulatory system: Secondary | ICD-10-CM | POA: Diagnosis not present

## 2014-05-11 DIAGNOSIS — Z8673 Personal history of transient ischemic attack (TIA), and cerebral infarction without residual deficits: Secondary | ICD-10-CM | POA: Diagnosis not present

## 2014-05-11 DIAGNOSIS — G40109 Localization-related (focal) (partial) symptomatic epilepsy and epileptic syndromes with simple partial seizures, not intractable, without status epilepticus: Secondary | ICD-10-CM | POA: Diagnosis not present

## 2014-05-11 DIAGNOSIS — J449 Chronic obstructive pulmonary disease, unspecified: Secondary | ICD-10-CM | POA: Diagnosis not present

## 2014-05-11 DIAGNOSIS — M109 Gout, unspecified: Secondary | ICD-10-CM | POA: Diagnosis not present

## 2014-05-11 NOTE — Telephone Encounter (Signed)
Burgess Estelleebecca Stone from [facility] called requesting a verbal order for speech therapy 2 times a week for 9 weeks. Lurena JoinerRebecca cab reached @ [phone]

## 2014-05-12 DIAGNOSIS — I739 Peripheral vascular disease, unspecified: Secondary | ICD-10-CM | POA: Diagnosis not present

## 2014-05-12 NOTE — Telephone Encounter (Signed)
Called and left a message for the ST, speech therapy 2x week for 9 weeks. I asked her to call back if she needed anything else.

## 2014-05-14 DIAGNOSIS — Z8673 Personal history of transient ischemic attack (TIA), and cerebral infarction without residual deficits: Secondary | ICD-10-CM | POA: Diagnosis not present

## 2014-05-14 DIAGNOSIS — J449 Chronic obstructive pulmonary disease, unspecified: Secondary | ICD-10-CM | POA: Diagnosis not present

## 2014-05-14 DIAGNOSIS — G40109 Localization-related (focal) (partial) symptomatic epilepsy and epileptic syndromes with simple partial seizures, not intractable, without status epilepticus: Secondary | ICD-10-CM | POA: Diagnosis not present

## 2014-05-14 DIAGNOSIS — I69920 Aphasia following unspecified cerebrovascular disease: Secondary | ICD-10-CM | POA: Diagnosis not present

## 2014-05-14 DIAGNOSIS — I10 Essential (primary) hypertension: Secondary | ICD-10-CM | POA: Diagnosis not present

## 2014-05-14 DIAGNOSIS — M109 Gout, unspecified: Secondary | ICD-10-CM | POA: Diagnosis not present

## 2014-05-14 DIAGNOSIS — I999 Unspecified disorder of circulatory system: Secondary | ICD-10-CM | POA: Diagnosis not present

## 2014-05-15 DIAGNOSIS — G40109 Localization-related (focal) (partial) symptomatic epilepsy and epileptic syndromes with simple partial seizures, not intractable, without status epilepticus: Secondary | ICD-10-CM | POA: Diagnosis not present

## 2014-05-15 DIAGNOSIS — M109 Gout, unspecified: Secondary | ICD-10-CM | POA: Diagnosis not present

## 2014-05-15 DIAGNOSIS — I69920 Aphasia following unspecified cerebrovascular disease: Secondary | ICD-10-CM | POA: Diagnosis not present

## 2014-05-15 DIAGNOSIS — J449 Chronic obstructive pulmonary disease, unspecified: Secondary | ICD-10-CM | POA: Diagnosis not present

## 2014-05-15 DIAGNOSIS — I10 Essential (primary) hypertension: Secondary | ICD-10-CM | POA: Diagnosis not present

## 2014-05-15 DIAGNOSIS — Z8673 Personal history of transient ischemic attack (TIA), and cerebral infarction without residual deficits: Secondary | ICD-10-CM | POA: Diagnosis not present

## 2014-05-15 DIAGNOSIS — I999 Unspecified disorder of circulatory system: Secondary | ICD-10-CM | POA: Diagnosis not present

## 2014-05-18 DIAGNOSIS — I999 Unspecified disorder of circulatory system: Secondary | ICD-10-CM | POA: Diagnosis not present

## 2014-05-18 DIAGNOSIS — M109 Gout, unspecified: Secondary | ICD-10-CM | POA: Diagnosis not present

## 2014-05-18 DIAGNOSIS — Z8673 Personal history of transient ischemic attack (TIA), and cerebral infarction without residual deficits: Secondary | ICD-10-CM | POA: Diagnosis not present

## 2014-05-18 DIAGNOSIS — J449 Chronic obstructive pulmonary disease, unspecified: Secondary | ICD-10-CM | POA: Diagnosis not present

## 2014-05-18 DIAGNOSIS — G40109 Localization-related (focal) (partial) symptomatic epilepsy and epileptic syndromes with simple partial seizures, not intractable, without status epilepticus: Secondary | ICD-10-CM | POA: Diagnosis not present

## 2014-05-18 DIAGNOSIS — I10 Essential (primary) hypertension: Secondary | ICD-10-CM | POA: Diagnosis not present

## 2014-05-18 DIAGNOSIS — I69920 Aphasia following unspecified cerebrovascular disease: Secondary | ICD-10-CM | POA: Diagnosis not present

## 2014-05-21 DIAGNOSIS — I69920 Aphasia following unspecified cerebrovascular disease: Secondary | ICD-10-CM | POA: Diagnosis not present

## 2014-05-21 DIAGNOSIS — M109 Gout, unspecified: Secondary | ICD-10-CM | POA: Diagnosis not present

## 2014-05-21 DIAGNOSIS — J449 Chronic obstructive pulmonary disease, unspecified: Secondary | ICD-10-CM | POA: Diagnosis not present

## 2014-05-21 DIAGNOSIS — I999 Unspecified disorder of circulatory system: Secondary | ICD-10-CM | POA: Diagnosis not present

## 2014-05-21 DIAGNOSIS — I10 Essential (primary) hypertension: Secondary | ICD-10-CM | POA: Diagnosis not present

## 2014-05-21 DIAGNOSIS — Z8673 Personal history of transient ischemic attack (TIA), and cerebral infarction without residual deficits: Secondary | ICD-10-CM | POA: Diagnosis not present

## 2014-05-21 DIAGNOSIS — G40109 Localization-related (focal) (partial) symptomatic epilepsy and epileptic syndromes with simple partial seizures, not intractable, without status epilepticus: Secondary | ICD-10-CM | POA: Diagnosis not present

## 2014-05-22 DIAGNOSIS — I69920 Aphasia following unspecified cerebrovascular disease: Secondary | ICD-10-CM | POA: Diagnosis not present

## 2014-05-22 DIAGNOSIS — G40109 Localization-related (focal) (partial) symptomatic epilepsy and epileptic syndromes with simple partial seizures, not intractable, without status epilepticus: Secondary | ICD-10-CM | POA: Diagnosis not present

## 2014-05-22 DIAGNOSIS — I10 Essential (primary) hypertension: Secondary | ICD-10-CM | POA: Diagnosis not present

## 2014-05-22 DIAGNOSIS — J449 Chronic obstructive pulmonary disease, unspecified: Secondary | ICD-10-CM | POA: Diagnosis not present

## 2014-05-22 DIAGNOSIS — I999 Unspecified disorder of circulatory system: Secondary | ICD-10-CM | POA: Diagnosis not present

## 2014-05-22 DIAGNOSIS — Z8673 Personal history of transient ischemic attack (TIA), and cerebral infarction without residual deficits: Secondary | ICD-10-CM | POA: Diagnosis not present

## 2014-05-22 DIAGNOSIS — M109 Gout, unspecified: Secondary | ICD-10-CM | POA: Diagnosis not present

## 2014-05-24 DIAGNOSIS — M109 Gout, unspecified: Secondary | ICD-10-CM | POA: Diagnosis not present

## 2014-05-24 DIAGNOSIS — G40109 Localization-related (focal) (partial) symptomatic epilepsy and epileptic syndromes with simple partial seizures, not intractable, without status epilepticus: Secondary | ICD-10-CM | POA: Diagnosis not present

## 2014-05-24 DIAGNOSIS — Z8673 Personal history of transient ischemic attack (TIA), and cerebral infarction without residual deficits: Secondary | ICD-10-CM | POA: Diagnosis not present

## 2014-05-24 DIAGNOSIS — J449 Chronic obstructive pulmonary disease, unspecified: Secondary | ICD-10-CM | POA: Diagnosis not present

## 2014-05-24 DIAGNOSIS — I999 Unspecified disorder of circulatory system: Secondary | ICD-10-CM | POA: Diagnosis not present

## 2014-05-24 DIAGNOSIS — I69920 Aphasia following unspecified cerebrovascular disease: Secondary | ICD-10-CM | POA: Diagnosis not present

## 2014-05-24 DIAGNOSIS — I10 Essential (primary) hypertension: Secondary | ICD-10-CM | POA: Diagnosis not present

## 2014-05-26 DIAGNOSIS — J449 Chronic obstructive pulmonary disease, unspecified: Secondary | ICD-10-CM | POA: Diagnosis not present

## 2014-05-26 DIAGNOSIS — M109 Gout, unspecified: Secondary | ICD-10-CM | POA: Diagnosis not present

## 2014-05-26 DIAGNOSIS — Z8673 Personal history of transient ischemic attack (TIA), and cerebral infarction without residual deficits: Secondary | ICD-10-CM | POA: Diagnosis not present

## 2014-05-26 DIAGNOSIS — I999 Unspecified disorder of circulatory system: Secondary | ICD-10-CM | POA: Diagnosis not present

## 2014-05-26 DIAGNOSIS — I10 Essential (primary) hypertension: Secondary | ICD-10-CM | POA: Diagnosis not present

## 2014-05-26 DIAGNOSIS — G40109 Localization-related (focal) (partial) symptomatic epilepsy and epileptic syndromes with simple partial seizures, not intractable, without status epilepticus: Secondary | ICD-10-CM | POA: Diagnosis not present

## 2014-05-26 DIAGNOSIS — I69920 Aphasia following unspecified cerebrovascular disease: Secondary | ICD-10-CM | POA: Diagnosis not present

## 2014-05-29 DIAGNOSIS — I10 Essential (primary) hypertension: Secondary | ICD-10-CM | POA: Diagnosis not present

## 2014-05-29 DIAGNOSIS — I999 Unspecified disorder of circulatory system: Secondary | ICD-10-CM | POA: Diagnosis not present

## 2014-05-29 DIAGNOSIS — G40109 Localization-related (focal) (partial) symptomatic epilepsy and epileptic syndromes with simple partial seizures, not intractable, without status epilepticus: Secondary | ICD-10-CM | POA: Diagnosis not present

## 2014-05-29 DIAGNOSIS — Z8673 Personal history of transient ischemic attack (TIA), and cerebral infarction without residual deficits: Secondary | ICD-10-CM | POA: Diagnosis not present

## 2014-05-29 DIAGNOSIS — J449 Chronic obstructive pulmonary disease, unspecified: Secondary | ICD-10-CM | POA: Diagnosis not present

## 2014-05-29 DIAGNOSIS — M109 Gout, unspecified: Secondary | ICD-10-CM | POA: Diagnosis not present

## 2014-05-29 DIAGNOSIS — I69920 Aphasia following unspecified cerebrovascular disease: Secondary | ICD-10-CM | POA: Diagnosis not present

## 2014-05-31 DIAGNOSIS — Z8673 Personal history of transient ischemic attack (TIA), and cerebral infarction without residual deficits: Secondary | ICD-10-CM | POA: Diagnosis not present

## 2014-05-31 DIAGNOSIS — I10 Essential (primary) hypertension: Secondary | ICD-10-CM | POA: Diagnosis not present

## 2014-05-31 DIAGNOSIS — G40109 Localization-related (focal) (partial) symptomatic epilepsy and epileptic syndromes with simple partial seizures, not intractable, without status epilepticus: Secondary | ICD-10-CM | POA: Diagnosis not present

## 2014-05-31 DIAGNOSIS — M109 Gout, unspecified: Secondary | ICD-10-CM | POA: Diagnosis not present

## 2014-05-31 DIAGNOSIS — I69920 Aphasia following unspecified cerebrovascular disease: Secondary | ICD-10-CM | POA: Diagnosis not present

## 2014-05-31 DIAGNOSIS — I999 Unspecified disorder of circulatory system: Secondary | ICD-10-CM | POA: Diagnosis not present

## 2014-05-31 DIAGNOSIS — J449 Chronic obstructive pulmonary disease, unspecified: Secondary | ICD-10-CM | POA: Diagnosis not present

## 2014-06-02 DIAGNOSIS — I999 Unspecified disorder of circulatory system: Secondary | ICD-10-CM | POA: Diagnosis not present

## 2014-06-02 DIAGNOSIS — M109 Gout, unspecified: Secondary | ICD-10-CM | POA: Diagnosis not present

## 2014-06-02 DIAGNOSIS — I69920 Aphasia following unspecified cerebrovascular disease: Secondary | ICD-10-CM | POA: Diagnosis not present

## 2014-06-02 DIAGNOSIS — I10 Essential (primary) hypertension: Secondary | ICD-10-CM | POA: Diagnosis not present

## 2014-06-02 DIAGNOSIS — Z8673 Personal history of transient ischemic attack (TIA), and cerebral infarction without residual deficits: Secondary | ICD-10-CM | POA: Diagnosis not present

## 2014-06-02 DIAGNOSIS — G40109 Localization-related (focal) (partial) symptomatic epilepsy and epileptic syndromes with simple partial seizures, not intractable, without status epilepticus: Secondary | ICD-10-CM | POA: Diagnosis not present

## 2014-06-02 DIAGNOSIS — J449 Chronic obstructive pulmonary disease, unspecified: Secondary | ICD-10-CM | POA: Diagnosis not present

## 2014-06-05 DIAGNOSIS — I69920 Aphasia following unspecified cerebrovascular disease: Secondary | ICD-10-CM | POA: Diagnosis not present

## 2014-06-05 DIAGNOSIS — I999 Unspecified disorder of circulatory system: Secondary | ICD-10-CM | POA: Diagnosis not present

## 2014-06-05 DIAGNOSIS — Z8673 Personal history of transient ischemic attack (TIA), and cerebral infarction without residual deficits: Secondary | ICD-10-CM | POA: Diagnosis not present

## 2014-06-05 DIAGNOSIS — J449 Chronic obstructive pulmonary disease, unspecified: Secondary | ICD-10-CM | POA: Diagnosis not present

## 2014-06-05 DIAGNOSIS — I10 Essential (primary) hypertension: Secondary | ICD-10-CM | POA: Diagnosis not present

## 2014-06-05 DIAGNOSIS — G40109 Localization-related (focal) (partial) symptomatic epilepsy and epileptic syndromes with simple partial seizures, not intractable, without status epilepticus: Secondary | ICD-10-CM | POA: Diagnosis not present

## 2014-06-05 DIAGNOSIS — M109 Gout, unspecified: Secondary | ICD-10-CM | POA: Diagnosis not present

## 2014-06-08 DIAGNOSIS — Z8673 Personal history of transient ischemic attack (TIA), and cerebral infarction without residual deficits: Secondary | ICD-10-CM | POA: Diagnosis not present

## 2014-06-08 DIAGNOSIS — J449 Chronic obstructive pulmonary disease, unspecified: Secondary | ICD-10-CM | POA: Diagnosis not present

## 2014-06-08 DIAGNOSIS — G40109 Localization-related (focal) (partial) symptomatic epilepsy and epileptic syndromes with simple partial seizures, not intractable, without status epilepticus: Secondary | ICD-10-CM | POA: Diagnosis not present

## 2014-06-08 DIAGNOSIS — M109 Gout, unspecified: Secondary | ICD-10-CM | POA: Diagnosis not present

## 2014-06-08 DIAGNOSIS — I10 Essential (primary) hypertension: Secondary | ICD-10-CM | POA: Diagnosis not present

## 2014-06-08 DIAGNOSIS — I69920 Aphasia following unspecified cerebrovascular disease: Secondary | ICD-10-CM | POA: Diagnosis not present

## 2014-06-08 DIAGNOSIS — I999 Unspecified disorder of circulatory system: Secondary | ICD-10-CM | POA: Diagnosis not present

## 2014-06-09 DIAGNOSIS — Z8673 Personal history of transient ischemic attack (TIA), and cerebral infarction without residual deficits: Secondary | ICD-10-CM | POA: Diagnosis not present

## 2014-06-09 DIAGNOSIS — G40109 Localization-related (focal) (partial) symptomatic epilepsy and epileptic syndromes with simple partial seizures, not intractable, without status epilepticus: Secondary | ICD-10-CM | POA: Diagnosis not present

## 2014-06-09 DIAGNOSIS — J449 Chronic obstructive pulmonary disease, unspecified: Secondary | ICD-10-CM | POA: Diagnosis not present

## 2014-06-09 DIAGNOSIS — M109 Gout, unspecified: Secondary | ICD-10-CM | POA: Diagnosis not present

## 2014-06-09 DIAGNOSIS — I69920 Aphasia following unspecified cerebrovascular disease: Secondary | ICD-10-CM | POA: Diagnosis not present

## 2014-06-09 DIAGNOSIS — I10 Essential (primary) hypertension: Secondary | ICD-10-CM | POA: Diagnosis not present

## 2014-06-09 DIAGNOSIS — I999 Unspecified disorder of circulatory system: Secondary | ICD-10-CM | POA: Diagnosis not present

## 2014-06-12 DIAGNOSIS — J449 Chronic obstructive pulmonary disease, unspecified: Secondary | ICD-10-CM | POA: Diagnosis not present

## 2014-06-12 DIAGNOSIS — I999 Unspecified disorder of circulatory system: Secondary | ICD-10-CM | POA: Diagnosis not present

## 2014-06-12 DIAGNOSIS — I69920 Aphasia following unspecified cerebrovascular disease: Secondary | ICD-10-CM | POA: Diagnosis not present

## 2014-06-12 DIAGNOSIS — I739 Peripheral vascular disease, unspecified: Secondary | ICD-10-CM | POA: Diagnosis not present

## 2014-06-12 DIAGNOSIS — M109 Gout, unspecified: Secondary | ICD-10-CM | POA: Diagnosis not present

## 2014-06-12 DIAGNOSIS — Z8673 Personal history of transient ischemic attack (TIA), and cerebral infarction without residual deficits: Secondary | ICD-10-CM | POA: Diagnosis not present

## 2014-06-12 DIAGNOSIS — I10 Essential (primary) hypertension: Secondary | ICD-10-CM | POA: Diagnosis not present

## 2014-06-12 DIAGNOSIS — G40109 Localization-related (focal) (partial) symptomatic epilepsy and epileptic syndromes with simple partial seizures, not intractable, without status epilepticus: Secondary | ICD-10-CM | POA: Diagnosis not present

## 2014-06-14 DIAGNOSIS — I69920 Aphasia following unspecified cerebrovascular disease: Secondary | ICD-10-CM | POA: Diagnosis not present

## 2014-06-14 DIAGNOSIS — J449 Chronic obstructive pulmonary disease, unspecified: Secondary | ICD-10-CM | POA: Diagnosis not present

## 2014-06-14 DIAGNOSIS — M109 Gout, unspecified: Secondary | ICD-10-CM | POA: Diagnosis not present

## 2014-06-14 DIAGNOSIS — G40109 Localization-related (focal) (partial) symptomatic epilepsy and epileptic syndromes with simple partial seizures, not intractable, without status epilepticus: Secondary | ICD-10-CM | POA: Diagnosis not present

## 2014-06-14 DIAGNOSIS — I10 Essential (primary) hypertension: Secondary | ICD-10-CM | POA: Diagnosis not present

## 2014-06-14 DIAGNOSIS — Z8673 Personal history of transient ischemic attack (TIA), and cerebral infarction without residual deficits: Secondary | ICD-10-CM | POA: Diagnosis not present

## 2014-06-14 DIAGNOSIS — I999 Unspecified disorder of circulatory system: Secondary | ICD-10-CM | POA: Diagnosis not present

## 2014-06-16 DIAGNOSIS — I10 Essential (primary) hypertension: Secondary | ICD-10-CM | POA: Diagnosis not present

## 2014-06-16 DIAGNOSIS — K5900 Constipation, unspecified: Secondary | ICD-10-CM | POA: Diagnosis not present

## 2014-06-18 DIAGNOSIS — I999 Unspecified disorder of circulatory system: Secondary | ICD-10-CM | POA: Diagnosis not present

## 2014-06-18 DIAGNOSIS — J449 Chronic obstructive pulmonary disease, unspecified: Secondary | ICD-10-CM | POA: Diagnosis not present

## 2014-06-18 DIAGNOSIS — Z8673 Personal history of transient ischemic attack (TIA), and cerebral infarction without residual deficits: Secondary | ICD-10-CM | POA: Diagnosis not present

## 2014-06-18 DIAGNOSIS — I69920 Aphasia following unspecified cerebrovascular disease: Secondary | ICD-10-CM | POA: Diagnosis not present

## 2014-06-18 DIAGNOSIS — M109 Gout, unspecified: Secondary | ICD-10-CM | POA: Diagnosis not present

## 2014-06-18 DIAGNOSIS — I10 Essential (primary) hypertension: Secondary | ICD-10-CM | POA: Diagnosis not present

## 2014-06-18 DIAGNOSIS — G40109 Localization-related (focal) (partial) symptomatic epilepsy and epileptic syndromes with simple partial seizures, not intractable, without status epilepticus: Secondary | ICD-10-CM | POA: Diagnosis not present

## 2014-06-19 DIAGNOSIS — I10 Essential (primary) hypertension: Secondary | ICD-10-CM | POA: Diagnosis not present

## 2014-06-19 DIAGNOSIS — J449 Chronic obstructive pulmonary disease, unspecified: Secondary | ICD-10-CM | POA: Diagnosis not present

## 2014-06-19 DIAGNOSIS — Z8673 Personal history of transient ischemic attack (TIA), and cerebral infarction without residual deficits: Secondary | ICD-10-CM | POA: Diagnosis not present

## 2014-06-19 DIAGNOSIS — I999 Unspecified disorder of circulatory system: Secondary | ICD-10-CM | POA: Diagnosis not present

## 2014-06-19 DIAGNOSIS — G40109 Localization-related (focal) (partial) symptomatic epilepsy and epileptic syndromes with simple partial seizures, not intractable, without status epilepticus: Secondary | ICD-10-CM | POA: Diagnosis not present

## 2014-06-19 DIAGNOSIS — M109 Gout, unspecified: Secondary | ICD-10-CM | POA: Diagnosis not present

## 2014-06-19 DIAGNOSIS — I69920 Aphasia following unspecified cerebrovascular disease: Secondary | ICD-10-CM | POA: Diagnosis not present

## 2014-06-22 ENCOUNTER — Telehealth: Payer: Self-pay | Admitting: *Deleted

## 2014-06-22 NOTE — Telephone Encounter (Signed)
Spoke to wife re: patient has FU tomorrow and last saw Dr Marjory Lies on 05/05/14. She states this must have been made in error. She nor her husband requested to be seen early. Informed her the appt tomorrow will be cancelled; the appt in Oct was confirmed with her. She verbalized understanding, appreciation.

## 2014-06-23 ENCOUNTER — Ambulatory Visit: Payer: Self-pay | Admitting: Diagnostic Neuroimaging

## 2014-06-23 DIAGNOSIS — G40109 Localization-related (focal) (partial) symptomatic epilepsy and epileptic syndromes with simple partial seizures, not intractable, without status epilepticus: Secondary | ICD-10-CM | POA: Diagnosis not present

## 2014-06-23 DIAGNOSIS — M109 Gout, unspecified: Secondary | ICD-10-CM | POA: Diagnosis not present

## 2014-06-23 DIAGNOSIS — I10 Essential (primary) hypertension: Secondary | ICD-10-CM | POA: Diagnosis not present

## 2014-06-23 DIAGNOSIS — I69920 Aphasia following unspecified cerebrovascular disease: Secondary | ICD-10-CM | POA: Diagnosis not present

## 2014-06-23 DIAGNOSIS — Z8673 Personal history of transient ischemic attack (TIA), and cerebral infarction without residual deficits: Secondary | ICD-10-CM | POA: Diagnosis not present

## 2014-06-23 DIAGNOSIS — I999 Unspecified disorder of circulatory system: Secondary | ICD-10-CM | POA: Diagnosis not present

## 2014-06-23 DIAGNOSIS — J449 Chronic obstructive pulmonary disease, unspecified: Secondary | ICD-10-CM | POA: Diagnosis not present

## 2014-06-24 DIAGNOSIS — I10 Essential (primary) hypertension: Secondary | ICD-10-CM | POA: Diagnosis not present

## 2014-06-24 DIAGNOSIS — J449 Chronic obstructive pulmonary disease, unspecified: Secondary | ICD-10-CM | POA: Diagnosis not present

## 2014-06-24 DIAGNOSIS — I999 Unspecified disorder of circulatory system: Secondary | ICD-10-CM | POA: Diagnosis not present

## 2014-06-24 DIAGNOSIS — I69920 Aphasia following unspecified cerebrovascular disease: Secondary | ICD-10-CM | POA: Diagnosis not present

## 2014-06-24 DIAGNOSIS — Z8673 Personal history of transient ischemic attack (TIA), and cerebral infarction without residual deficits: Secondary | ICD-10-CM | POA: Diagnosis not present

## 2014-06-24 DIAGNOSIS — M109 Gout, unspecified: Secondary | ICD-10-CM | POA: Diagnosis not present

## 2014-06-24 DIAGNOSIS — G40109 Localization-related (focal) (partial) symptomatic epilepsy and epileptic syndromes with simple partial seizures, not intractable, without status epilepticus: Secondary | ICD-10-CM | POA: Diagnosis not present

## 2014-06-26 DIAGNOSIS — G40109 Localization-related (focal) (partial) symptomatic epilepsy and epileptic syndromes with simple partial seizures, not intractable, without status epilepticus: Secondary | ICD-10-CM | POA: Diagnosis not present

## 2014-06-26 DIAGNOSIS — M109 Gout, unspecified: Secondary | ICD-10-CM | POA: Diagnosis not present

## 2014-06-26 DIAGNOSIS — I999 Unspecified disorder of circulatory system: Secondary | ICD-10-CM | POA: Diagnosis not present

## 2014-06-26 DIAGNOSIS — J449 Chronic obstructive pulmonary disease, unspecified: Secondary | ICD-10-CM | POA: Diagnosis not present

## 2014-06-26 DIAGNOSIS — Z8673 Personal history of transient ischemic attack (TIA), and cerebral infarction without residual deficits: Secondary | ICD-10-CM | POA: Diagnosis not present

## 2014-06-26 DIAGNOSIS — I69920 Aphasia following unspecified cerebrovascular disease: Secondary | ICD-10-CM | POA: Diagnosis not present

## 2014-06-26 DIAGNOSIS — I10 Essential (primary) hypertension: Secondary | ICD-10-CM | POA: Diagnosis not present

## 2014-06-28 DIAGNOSIS — I10 Essential (primary) hypertension: Secondary | ICD-10-CM | POA: Diagnosis not present

## 2014-06-28 DIAGNOSIS — M109 Gout, unspecified: Secondary | ICD-10-CM | POA: Diagnosis not present

## 2014-06-28 DIAGNOSIS — I999 Unspecified disorder of circulatory system: Secondary | ICD-10-CM | POA: Diagnosis not present

## 2014-06-28 DIAGNOSIS — J449 Chronic obstructive pulmonary disease, unspecified: Secondary | ICD-10-CM | POA: Diagnosis not present

## 2014-06-28 DIAGNOSIS — I69920 Aphasia following unspecified cerebrovascular disease: Secondary | ICD-10-CM | POA: Diagnosis not present

## 2014-06-28 DIAGNOSIS — Z8673 Personal history of transient ischemic attack (TIA), and cerebral infarction without residual deficits: Secondary | ICD-10-CM | POA: Diagnosis not present

## 2014-06-28 DIAGNOSIS — G40109 Localization-related (focal) (partial) symptomatic epilepsy and epileptic syndromes with simple partial seizures, not intractable, without status epilepticus: Secondary | ICD-10-CM | POA: Diagnosis not present

## 2014-06-30 DIAGNOSIS — Z8673 Personal history of transient ischemic attack (TIA), and cerebral infarction without residual deficits: Secondary | ICD-10-CM | POA: Diagnosis not present

## 2014-06-30 DIAGNOSIS — I10 Essential (primary) hypertension: Secondary | ICD-10-CM | POA: Diagnosis not present

## 2014-06-30 DIAGNOSIS — M109 Gout, unspecified: Secondary | ICD-10-CM | POA: Diagnosis not present

## 2014-06-30 DIAGNOSIS — G40109 Localization-related (focal) (partial) symptomatic epilepsy and epileptic syndromes with simple partial seizures, not intractable, without status epilepticus: Secondary | ICD-10-CM | POA: Diagnosis not present

## 2014-06-30 DIAGNOSIS — I999 Unspecified disorder of circulatory system: Secondary | ICD-10-CM | POA: Diagnosis not present

## 2014-06-30 DIAGNOSIS — J449 Chronic obstructive pulmonary disease, unspecified: Secondary | ICD-10-CM | POA: Diagnosis not present

## 2014-06-30 DIAGNOSIS — I69920 Aphasia following unspecified cerebrovascular disease: Secondary | ICD-10-CM | POA: Diagnosis not present

## 2014-07-03 DIAGNOSIS — G40109 Localization-related (focal) (partial) symptomatic epilepsy and epileptic syndromes with simple partial seizures, not intractable, without status epilepticus: Secondary | ICD-10-CM | POA: Diagnosis not present

## 2014-07-03 DIAGNOSIS — I10 Essential (primary) hypertension: Secondary | ICD-10-CM | POA: Diagnosis not present

## 2014-07-03 DIAGNOSIS — I999 Unspecified disorder of circulatory system: Secondary | ICD-10-CM | POA: Diagnosis not present

## 2014-07-03 DIAGNOSIS — J449 Chronic obstructive pulmonary disease, unspecified: Secondary | ICD-10-CM | POA: Diagnosis not present

## 2014-07-03 DIAGNOSIS — M109 Gout, unspecified: Secondary | ICD-10-CM | POA: Diagnosis not present

## 2014-07-03 DIAGNOSIS — Z8673 Personal history of transient ischemic attack (TIA), and cerebral infarction without residual deficits: Secondary | ICD-10-CM | POA: Diagnosis not present

## 2014-07-03 DIAGNOSIS — I69920 Aphasia following unspecified cerebrovascular disease: Secondary | ICD-10-CM | POA: Diagnosis not present

## 2014-07-05 DIAGNOSIS — G40109 Localization-related (focal) (partial) symptomatic epilepsy and epileptic syndromes with simple partial seizures, not intractable, without status epilepticus: Secondary | ICD-10-CM | POA: Diagnosis not present

## 2014-07-05 DIAGNOSIS — Z8673 Personal history of transient ischemic attack (TIA), and cerebral infarction without residual deficits: Secondary | ICD-10-CM | POA: Diagnosis not present

## 2014-07-05 DIAGNOSIS — I10 Essential (primary) hypertension: Secondary | ICD-10-CM | POA: Diagnosis not present

## 2014-07-05 DIAGNOSIS — M109 Gout, unspecified: Secondary | ICD-10-CM | POA: Diagnosis not present

## 2014-07-05 DIAGNOSIS — I999 Unspecified disorder of circulatory system: Secondary | ICD-10-CM | POA: Diagnosis not present

## 2014-07-05 DIAGNOSIS — J449 Chronic obstructive pulmonary disease, unspecified: Secondary | ICD-10-CM | POA: Diagnosis not present

## 2014-07-05 DIAGNOSIS — I69920 Aphasia following unspecified cerebrovascular disease: Secondary | ICD-10-CM | POA: Diagnosis not present

## 2014-07-12 DIAGNOSIS — I739 Peripheral vascular disease, unspecified: Secondary | ICD-10-CM | POA: Diagnosis not present

## 2014-07-16 ENCOUNTER — Telehealth: Payer: Self-pay | Admitting: Diagnostic Neuroimaging

## 2014-07-16 ENCOUNTER — Encounter: Payer: Self-pay | Admitting: *Deleted

## 2014-07-16 NOTE — Telephone Encounter (Signed)
Patient's wife Elita Quickam is calling. She is summoned for jury duty and she needs a letter stating she is the patient's caretaker and would not be unable to leave the patient alone therefore would not be able to serve. Please call when the letter is ready for pickup. Thank you.

## 2014-07-16 NOTE — Telephone Encounter (Signed)
Spoke with Ms Mayford KnifeWilliams and informed her that letter is ready for pick up at front office. Gave her office's open hours. She verbalized understanding, appreciation and stated she would pick letter up Monday.

## 2014-07-17 DIAGNOSIS — I69920 Aphasia following unspecified cerebrovascular disease: Secondary | ICD-10-CM | POA: Diagnosis not present

## 2014-07-17 DIAGNOSIS — I999 Unspecified disorder of circulatory system: Secondary | ICD-10-CM | POA: Diagnosis not present

## 2014-07-17 DIAGNOSIS — M109 Gout, unspecified: Secondary | ICD-10-CM | POA: Diagnosis not present

## 2014-07-17 DIAGNOSIS — Z8673 Personal history of transient ischemic attack (TIA), and cerebral infarction without residual deficits: Secondary | ICD-10-CM | POA: Diagnosis not present

## 2014-07-17 DIAGNOSIS — I10 Essential (primary) hypertension: Secondary | ICD-10-CM | POA: Diagnosis not present

## 2014-07-17 DIAGNOSIS — G40109 Localization-related (focal) (partial) symptomatic epilepsy and epileptic syndromes with simple partial seizures, not intractable, without status epilepticus: Secondary | ICD-10-CM | POA: Diagnosis not present

## 2014-07-17 DIAGNOSIS — J449 Chronic obstructive pulmonary disease, unspecified: Secondary | ICD-10-CM | POA: Diagnosis not present

## 2014-07-20 DIAGNOSIS — G40109 Localization-related (focal) (partial) symptomatic epilepsy and epileptic syndromes with simple partial seizures, not intractable, without status epilepticus: Secondary | ICD-10-CM | POA: Diagnosis not present

## 2014-07-20 DIAGNOSIS — I69920 Aphasia following unspecified cerebrovascular disease: Secondary | ICD-10-CM | POA: Diagnosis not present

## 2014-07-20 DIAGNOSIS — I999 Unspecified disorder of circulatory system: Secondary | ICD-10-CM | POA: Diagnosis not present

## 2014-07-20 DIAGNOSIS — Z8673 Personal history of transient ischemic attack (TIA), and cerebral infarction without residual deficits: Secondary | ICD-10-CM | POA: Diagnosis not present

## 2014-07-20 DIAGNOSIS — J449 Chronic obstructive pulmonary disease, unspecified: Secondary | ICD-10-CM | POA: Diagnosis not present

## 2014-07-20 DIAGNOSIS — M109 Gout, unspecified: Secondary | ICD-10-CM | POA: Diagnosis not present

## 2014-07-20 DIAGNOSIS — I10 Essential (primary) hypertension: Secondary | ICD-10-CM | POA: Diagnosis not present

## 2014-07-22 DIAGNOSIS — I69920 Aphasia following unspecified cerebrovascular disease: Secondary | ICD-10-CM | POA: Diagnosis not present

## 2014-07-22 DIAGNOSIS — I10 Essential (primary) hypertension: Secondary | ICD-10-CM | POA: Diagnosis not present

## 2014-07-22 DIAGNOSIS — J449 Chronic obstructive pulmonary disease, unspecified: Secondary | ICD-10-CM | POA: Diagnosis not present

## 2014-07-22 DIAGNOSIS — I999 Unspecified disorder of circulatory system: Secondary | ICD-10-CM | POA: Diagnosis not present

## 2014-07-22 DIAGNOSIS — Z8673 Personal history of transient ischemic attack (TIA), and cerebral infarction without residual deficits: Secondary | ICD-10-CM | POA: Diagnosis not present

## 2014-07-22 DIAGNOSIS — G40109 Localization-related (focal) (partial) symptomatic epilepsy and epileptic syndromes with simple partial seizures, not intractable, without status epilepticus: Secondary | ICD-10-CM | POA: Diagnosis not present

## 2014-07-22 DIAGNOSIS — M109 Gout, unspecified: Secondary | ICD-10-CM | POA: Diagnosis not present

## 2014-07-24 DIAGNOSIS — I10 Essential (primary) hypertension: Secondary | ICD-10-CM | POA: Diagnosis not present

## 2014-07-24 DIAGNOSIS — I69920 Aphasia following unspecified cerebrovascular disease: Secondary | ICD-10-CM | POA: Diagnosis not present

## 2014-07-24 DIAGNOSIS — Z8673 Personal history of transient ischemic attack (TIA), and cerebral infarction without residual deficits: Secondary | ICD-10-CM | POA: Diagnosis not present

## 2014-07-24 DIAGNOSIS — G40109 Localization-related (focal) (partial) symptomatic epilepsy and epileptic syndromes with simple partial seizures, not intractable, without status epilepticus: Secondary | ICD-10-CM | POA: Diagnosis not present

## 2014-07-24 DIAGNOSIS — J449 Chronic obstructive pulmonary disease, unspecified: Secondary | ICD-10-CM | POA: Diagnosis not present

## 2014-07-24 DIAGNOSIS — M109 Gout, unspecified: Secondary | ICD-10-CM | POA: Diagnosis not present

## 2014-07-24 DIAGNOSIS — I999 Unspecified disorder of circulatory system: Secondary | ICD-10-CM | POA: Diagnosis not present

## 2014-07-26 DIAGNOSIS — I10 Essential (primary) hypertension: Secondary | ICD-10-CM | POA: Diagnosis not present

## 2014-07-26 DIAGNOSIS — I999 Unspecified disorder of circulatory system: Secondary | ICD-10-CM | POA: Diagnosis not present

## 2014-07-26 DIAGNOSIS — I69920 Aphasia following unspecified cerebrovascular disease: Secondary | ICD-10-CM | POA: Diagnosis not present

## 2014-07-26 DIAGNOSIS — Z8673 Personal history of transient ischemic attack (TIA), and cerebral infarction without residual deficits: Secondary | ICD-10-CM | POA: Diagnosis not present

## 2014-07-26 DIAGNOSIS — G40109 Localization-related (focal) (partial) symptomatic epilepsy and epileptic syndromes with simple partial seizures, not intractable, without status epilepticus: Secondary | ICD-10-CM | POA: Diagnosis not present

## 2014-07-26 DIAGNOSIS — J449 Chronic obstructive pulmonary disease, unspecified: Secondary | ICD-10-CM | POA: Diagnosis not present

## 2014-07-26 DIAGNOSIS — M109 Gout, unspecified: Secondary | ICD-10-CM | POA: Diagnosis not present

## 2014-07-28 DIAGNOSIS — M109 Gout, unspecified: Secondary | ICD-10-CM | POA: Diagnosis not present

## 2014-07-28 DIAGNOSIS — I999 Unspecified disorder of circulatory system: Secondary | ICD-10-CM | POA: Diagnosis not present

## 2014-07-28 DIAGNOSIS — I10 Essential (primary) hypertension: Secondary | ICD-10-CM | POA: Diagnosis not present

## 2014-07-28 DIAGNOSIS — J449 Chronic obstructive pulmonary disease, unspecified: Secondary | ICD-10-CM | POA: Diagnosis not present

## 2014-07-28 DIAGNOSIS — I69920 Aphasia following unspecified cerebrovascular disease: Secondary | ICD-10-CM | POA: Diagnosis not present

## 2014-07-28 DIAGNOSIS — Z8673 Personal history of transient ischemic attack (TIA), and cerebral infarction without residual deficits: Secondary | ICD-10-CM | POA: Diagnosis not present

## 2014-07-28 DIAGNOSIS — G40109 Localization-related (focal) (partial) symptomatic epilepsy and epileptic syndromes with simple partial seizures, not intractable, without status epilepticus: Secondary | ICD-10-CM | POA: Diagnosis not present

## 2014-07-31 DIAGNOSIS — I999 Unspecified disorder of circulatory system: Secondary | ICD-10-CM | POA: Diagnosis not present

## 2014-07-31 DIAGNOSIS — I69920 Aphasia following unspecified cerebrovascular disease: Secondary | ICD-10-CM | POA: Diagnosis not present

## 2014-07-31 DIAGNOSIS — Z8673 Personal history of transient ischemic attack (TIA), and cerebral infarction without residual deficits: Secondary | ICD-10-CM | POA: Diagnosis not present

## 2014-07-31 DIAGNOSIS — M109 Gout, unspecified: Secondary | ICD-10-CM | POA: Diagnosis not present

## 2014-07-31 DIAGNOSIS — G40109 Localization-related (focal) (partial) symptomatic epilepsy and epileptic syndromes with simple partial seizures, not intractable, without status epilepticus: Secondary | ICD-10-CM | POA: Diagnosis not present

## 2014-07-31 DIAGNOSIS — I10 Essential (primary) hypertension: Secondary | ICD-10-CM | POA: Diagnosis not present

## 2014-07-31 DIAGNOSIS — J449 Chronic obstructive pulmonary disease, unspecified: Secondary | ICD-10-CM | POA: Diagnosis not present

## 2014-08-04 DIAGNOSIS — I69920 Aphasia following unspecified cerebrovascular disease: Secondary | ICD-10-CM | POA: Diagnosis not present

## 2014-08-04 DIAGNOSIS — I999 Unspecified disorder of circulatory system: Secondary | ICD-10-CM | POA: Diagnosis not present

## 2014-08-04 DIAGNOSIS — M109 Gout, unspecified: Secondary | ICD-10-CM | POA: Diagnosis not present

## 2014-08-04 DIAGNOSIS — Z8673 Personal history of transient ischemic attack (TIA), and cerebral infarction without residual deficits: Secondary | ICD-10-CM | POA: Diagnosis not present

## 2014-08-04 DIAGNOSIS — I10 Essential (primary) hypertension: Secondary | ICD-10-CM | POA: Diagnosis not present

## 2014-08-04 DIAGNOSIS — G40109 Localization-related (focal) (partial) symptomatic epilepsy and epileptic syndromes with simple partial seizures, not intractable, without status epilepticus: Secondary | ICD-10-CM | POA: Diagnosis not present

## 2014-08-04 DIAGNOSIS — J449 Chronic obstructive pulmonary disease, unspecified: Secondary | ICD-10-CM | POA: Diagnosis not present

## 2014-08-05 DIAGNOSIS — J449 Chronic obstructive pulmonary disease, unspecified: Secondary | ICD-10-CM | POA: Diagnosis not present

## 2014-08-05 DIAGNOSIS — M109 Gout, unspecified: Secondary | ICD-10-CM | POA: Diagnosis not present

## 2014-08-05 DIAGNOSIS — I69920 Aphasia following unspecified cerebrovascular disease: Secondary | ICD-10-CM | POA: Diagnosis not present

## 2014-08-05 DIAGNOSIS — Z8673 Personal history of transient ischemic attack (TIA), and cerebral infarction without residual deficits: Secondary | ICD-10-CM | POA: Diagnosis not present

## 2014-08-05 DIAGNOSIS — G40109 Localization-related (focal) (partial) symptomatic epilepsy and epileptic syndromes with simple partial seizures, not intractable, without status epilepticus: Secondary | ICD-10-CM | POA: Diagnosis not present

## 2014-08-05 DIAGNOSIS — I10 Essential (primary) hypertension: Secondary | ICD-10-CM | POA: Diagnosis not present

## 2014-08-05 DIAGNOSIS — I999 Unspecified disorder of circulatory system: Secondary | ICD-10-CM | POA: Diagnosis not present

## 2014-08-07 DIAGNOSIS — I10 Essential (primary) hypertension: Secondary | ICD-10-CM | POA: Diagnosis not present

## 2014-08-07 DIAGNOSIS — G40109 Localization-related (focal) (partial) symptomatic epilepsy and epileptic syndromes with simple partial seizures, not intractable, without status epilepticus: Secondary | ICD-10-CM | POA: Diagnosis not present

## 2014-08-07 DIAGNOSIS — I999 Unspecified disorder of circulatory system: Secondary | ICD-10-CM | POA: Diagnosis not present

## 2014-08-07 DIAGNOSIS — M109 Gout, unspecified: Secondary | ICD-10-CM | POA: Diagnosis not present

## 2014-08-07 DIAGNOSIS — Z8673 Personal history of transient ischemic attack (TIA), and cerebral infarction without residual deficits: Secondary | ICD-10-CM | POA: Diagnosis not present

## 2014-08-07 DIAGNOSIS — J449 Chronic obstructive pulmonary disease, unspecified: Secondary | ICD-10-CM | POA: Diagnosis not present

## 2014-08-07 DIAGNOSIS — I69920 Aphasia following unspecified cerebrovascular disease: Secondary | ICD-10-CM | POA: Diagnosis not present

## 2014-08-09 DIAGNOSIS — Z8673 Personal history of transient ischemic attack (TIA), and cerebral infarction without residual deficits: Secondary | ICD-10-CM | POA: Diagnosis not present

## 2014-08-09 DIAGNOSIS — M109 Gout, unspecified: Secondary | ICD-10-CM | POA: Diagnosis not present

## 2014-08-09 DIAGNOSIS — I69920 Aphasia following unspecified cerebrovascular disease: Secondary | ICD-10-CM | POA: Diagnosis not present

## 2014-08-09 DIAGNOSIS — J449 Chronic obstructive pulmonary disease, unspecified: Secondary | ICD-10-CM | POA: Diagnosis not present

## 2014-08-09 DIAGNOSIS — I999 Unspecified disorder of circulatory system: Secondary | ICD-10-CM | POA: Diagnosis not present

## 2014-08-09 DIAGNOSIS — G40109 Localization-related (focal) (partial) symptomatic epilepsy and epileptic syndromes with simple partial seizures, not intractable, without status epilepticus: Secondary | ICD-10-CM | POA: Diagnosis not present

## 2014-08-09 DIAGNOSIS — I10 Essential (primary) hypertension: Secondary | ICD-10-CM | POA: Diagnosis not present

## 2014-08-11 DIAGNOSIS — I10 Essential (primary) hypertension: Secondary | ICD-10-CM | POA: Diagnosis not present

## 2014-08-11 DIAGNOSIS — Z8673 Personal history of transient ischemic attack (TIA), and cerebral infarction without residual deficits: Secondary | ICD-10-CM | POA: Diagnosis not present

## 2014-08-11 DIAGNOSIS — M109 Gout, unspecified: Secondary | ICD-10-CM | POA: Diagnosis not present

## 2014-08-11 DIAGNOSIS — G40109 Localization-related (focal) (partial) symptomatic epilepsy and epileptic syndromes with simple partial seizures, not intractable, without status epilepticus: Secondary | ICD-10-CM | POA: Diagnosis not present

## 2014-08-11 DIAGNOSIS — I69920 Aphasia following unspecified cerebrovascular disease: Secondary | ICD-10-CM | POA: Diagnosis not present

## 2014-08-11 DIAGNOSIS — I999 Unspecified disorder of circulatory system: Secondary | ICD-10-CM | POA: Diagnosis not present

## 2014-08-11 DIAGNOSIS — J449 Chronic obstructive pulmonary disease, unspecified: Secondary | ICD-10-CM | POA: Diagnosis not present

## 2014-08-12 DIAGNOSIS — I739 Peripheral vascular disease, unspecified: Secondary | ICD-10-CM | POA: Diagnosis not present

## 2014-08-14 DIAGNOSIS — I10 Essential (primary) hypertension: Secondary | ICD-10-CM | POA: Diagnosis not present

## 2014-08-14 DIAGNOSIS — Z8673 Personal history of transient ischemic attack (TIA), and cerebral infarction without residual deficits: Secondary | ICD-10-CM | POA: Diagnosis not present

## 2014-08-14 DIAGNOSIS — I69920 Aphasia following unspecified cerebrovascular disease: Secondary | ICD-10-CM | POA: Diagnosis not present

## 2014-08-14 DIAGNOSIS — I999 Unspecified disorder of circulatory system: Secondary | ICD-10-CM | POA: Diagnosis not present

## 2014-08-14 DIAGNOSIS — G40109 Localization-related (focal) (partial) symptomatic epilepsy and epileptic syndromes with simple partial seizures, not intractable, without status epilepticus: Secondary | ICD-10-CM | POA: Diagnosis not present

## 2014-08-14 DIAGNOSIS — M109 Gout, unspecified: Secondary | ICD-10-CM | POA: Diagnosis not present

## 2014-08-14 DIAGNOSIS — J449 Chronic obstructive pulmonary disease, unspecified: Secondary | ICD-10-CM | POA: Diagnosis not present

## 2014-08-17 DIAGNOSIS — G40109 Localization-related (focal) (partial) symptomatic epilepsy and epileptic syndromes with simple partial seizures, not intractable, without status epilepticus: Secondary | ICD-10-CM | POA: Diagnosis not present

## 2014-08-17 DIAGNOSIS — I69920 Aphasia following unspecified cerebrovascular disease: Secondary | ICD-10-CM | POA: Diagnosis not present

## 2014-08-17 DIAGNOSIS — I10 Essential (primary) hypertension: Secondary | ICD-10-CM | POA: Diagnosis not present

## 2014-08-17 DIAGNOSIS — J449 Chronic obstructive pulmonary disease, unspecified: Secondary | ICD-10-CM | POA: Diagnosis not present

## 2014-08-17 DIAGNOSIS — M109 Gout, unspecified: Secondary | ICD-10-CM | POA: Diagnosis not present

## 2014-08-17 DIAGNOSIS — Z8673 Personal history of transient ischemic attack (TIA), and cerebral infarction without residual deficits: Secondary | ICD-10-CM | POA: Diagnosis not present

## 2014-08-17 DIAGNOSIS — I999 Unspecified disorder of circulatory system: Secondary | ICD-10-CM | POA: Diagnosis not present

## 2014-08-19 DIAGNOSIS — J449 Chronic obstructive pulmonary disease, unspecified: Secondary | ICD-10-CM | POA: Diagnosis not present

## 2014-08-19 DIAGNOSIS — I69920 Aphasia following unspecified cerebrovascular disease: Secondary | ICD-10-CM | POA: Diagnosis not present

## 2014-08-19 DIAGNOSIS — G40109 Localization-related (focal) (partial) symptomatic epilepsy and epileptic syndromes with simple partial seizures, not intractable, without status epilepticus: Secondary | ICD-10-CM | POA: Diagnosis not present

## 2014-08-19 DIAGNOSIS — I10 Essential (primary) hypertension: Secondary | ICD-10-CM | POA: Diagnosis not present

## 2014-08-19 DIAGNOSIS — I999 Unspecified disorder of circulatory system: Secondary | ICD-10-CM | POA: Diagnosis not present

## 2014-08-19 DIAGNOSIS — Z8673 Personal history of transient ischemic attack (TIA), and cerebral infarction without residual deficits: Secondary | ICD-10-CM | POA: Diagnosis not present

## 2014-08-19 DIAGNOSIS — M109 Gout, unspecified: Secondary | ICD-10-CM | POA: Diagnosis not present

## 2014-08-21 DIAGNOSIS — G40109 Localization-related (focal) (partial) symptomatic epilepsy and epileptic syndromes with simple partial seizures, not intractable, without status epilepticus: Secondary | ICD-10-CM | POA: Diagnosis not present

## 2014-08-21 DIAGNOSIS — J449 Chronic obstructive pulmonary disease, unspecified: Secondary | ICD-10-CM | POA: Diagnosis not present

## 2014-08-21 DIAGNOSIS — Z8673 Personal history of transient ischemic attack (TIA), and cerebral infarction without residual deficits: Secondary | ICD-10-CM | POA: Diagnosis not present

## 2014-08-21 DIAGNOSIS — M109 Gout, unspecified: Secondary | ICD-10-CM | POA: Diagnosis not present

## 2014-08-21 DIAGNOSIS — I69920 Aphasia following unspecified cerebrovascular disease: Secondary | ICD-10-CM | POA: Diagnosis not present

## 2014-08-21 DIAGNOSIS — I10 Essential (primary) hypertension: Secondary | ICD-10-CM | POA: Diagnosis not present

## 2014-08-21 DIAGNOSIS — I999 Unspecified disorder of circulatory system: Secondary | ICD-10-CM | POA: Diagnosis not present

## 2014-08-24 DIAGNOSIS — M109 Gout, unspecified: Secondary | ICD-10-CM | POA: Diagnosis not present

## 2014-08-24 DIAGNOSIS — Z8673 Personal history of transient ischemic attack (TIA), and cerebral infarction without residual deficits: Secondary | ICD-10-CM | POA: Diagnosis not present

## 2014-08-24 DIAGNOSIS — G40109 Localization-related (focal) (partial) symptomatic epilepsy and epileptic syndromes with simple partial seizures, not intractable, without status epilepticus: Secondary | ICD-10-CM | POA: Diagnosis not present

## 2014-08-24 DIAGNOSIS — J449 Chronic obstructive pulmonary disease, unspecified: Secondary | ICD-10-CM | POA: Diagnosis not present

## 2014-08-24 DIAGNOSIS — I69920 Aphasia following unspecified cerebrovascular disease: Secondary | ICD-10-CM | POA: Diagnosis not present

## 2014-08-24 DIAGNOSIS — I999 Unspecified disorder of circulatory system: Secondary | ICD-10-CM | POA: Diagnosis not present

## 2014-08-24 DIAGNOSIS — I10 Essential (primary) hypertension: Secondary | ICD-10-CM | POA: Diagnosis not present

## 2014-08-26 DIAGNOSIS — I69920 Aphasia following unspecified cerebrovascular disease: Secondary | ICD-10-CM | POA: Diagnosis not present

## 2014-08-26 DIAGNOSIS — M109 Gout, unspecified: Secondary | ICD-10-CM | POA: Diagnosis not present

## 2014-08-26 DIAGNOSIS — J449 Chronic obstructive pulmonary disease, unspecified: Secondary | ICD-10-CM | POA: Diagnosis not present

## 2014-08-26 DIAGNOSIS — I999 Unspecified disorder of circulatory system: Secondary | ICD-10-CM | POA: Diagnosis not present

## 2014-08-26 DIAGNOSIS — I10 Essential (primary) hypertension: Secondary | ICD-10-CM | POA: Diagnosis not present

## 2014-08-26 DIAGNOSIS — Z8673 Personal history of transient ischemic attack (TIA), and cerebral infarction without residual deficits: Secondary | ICD-10-CM | POA: Diagnosis not present

## 2014-08-26 DIAGNOSIS — G40109 Localization-related (focal) (partial) symptomatic epilepsy and epileptic syndromes with simple partial seizures, not intractable, without status epilepticus: Secondary | ICD-10-CM | POA: Diagnosis not present

## 2014-08-28 DIAGNOSIS — M109 Gout, unspecified: Secondary | ICD-10-CM | POA: Diagnosis not present

## 2014-08-28 DIAGNOSIS — I999 Unspecified disorder of circulatory system: Secondary | ICD-10-CM | POA: Diagnosis not present

## 2014-08-28 DIAGNOSIS — I69920 Aphasia following unspecified cerebrovascular disease: Secondary | ICD-10-CM | POA: Diagnosis not present

## 2014-08-28 DIAGNOSIS — G40109 Localization-related (focal) (partial) symptomatic epilepsy and epileptic syndromes with simple partial seizures, not intractable, without status epilepticus: Secondary | ICD-10-CM | POA: Diagnosis not present

## 2014-08-28 DIAGNOSIS — I10 Essential (primary) hypertension: Secondary | ICD-10-CM | POA: Diagnosis not present

## 2014-08-28 DIAGNOSIS — Z8673 Personal history of transient ischemic attack (TIA), and cerebral infarction without residual deficits: Secondary | ICD-10-CM | POA: Diagnosis not present

## 2014-08-28 DIAGNOSIS — J449 Chronic obstructive pulmonary disease, unspecified: Secondary | ICD-10-CM | POA: Diagnosis not present

## 2014-08-31 DIAGNOSIS — I10 Essential (primary) hypertension: Secondary | ICD-10-CM | POA: Diagnosis not present

## 2014-08-31 DIAGNOSIS — Z8673 Personal history of transient ischemic attack (TIA), and cerebral infarction without residual deficits: Secondary | ICD-10-CM | POA: Diagnosis not present

## 2014-08-31 DIAGNOSIS — M109 Gout, unspecified: Secondary | ICD-10-CM | POA: Diagnosis not present

## 2014-08-31 DIAGNOSIS — J449 Chronic obstructive pulmonary disease, unspecified: Secondary | ICD-10-CM | POA: Diagnosis not present

## 2014-08-31 DIAGNOSIS — I999 Unspecified disorder of circulatory system: Secondary | ICD-10-CM | POA: Diagnosis not present

## 2014-08-31 DIAGNOSIS — I69920 Aphasia following unspecified cerebrovascular disease: Secondary | ICD-10-CM | POA: Diagnosis not present

## 2014-08-31 DIAGNOSIS — G40109 Localization-related (focal) (partial) symptomatic epilepsy and epileptic syndromes with simple partial seizures, not intractable, without status epilepticus: Secondary | ICD-10-CM | POA: Diagnosis not present

## 2014-09-01 DIAGNOSIS — I10 Essential (primary) hypertension: Secondary | ICD-10-CM | POA: Diagnosis not present

## 2014-09-01 DIAGNOSIS — Z8673 Personal history of transient ischemic attack (TIA), and cerebral infarction without residual deficits: Secondary | ICD-10-CM | POA: Diagnosis not present

## 2014-09-01 DIAGNOSIS — J449 Chronic obstructive pulmonary disease, unspecified: Secondary | ICD-10-CM | POA: Diagnosis not present

## 2014-09-01 DIAGNOSIS — I69920 Aphasia following unspecified cerebrovascular disease: Secondary | ICD-10-CM | POA: Diagnosis not present

## 2014-09-01 DIAGNOSIS — I999 Unspecified disorder of circulatory system: Secondary | ICD-10-CM | POA: Diagnosis not present

## 2014-09-01 DIAGNOSIS — M109 Gout, unspecified: Secondary | ICD-10-CM | POA: Diagnosis not present

## 2014-09-01 DIAGNOSIS — G40109 Localization-related (focal) (partial) symptomatic epilepsy and epileptic syndromes with simple partial seizures, not intractable, without status epilepticus: Secondary | ICD-10-CM | POA: Diagnosis not present

## 2014-09-04 DIAGNOSIS — G40109 Localization-related (focal) (partial) symptomatic epilepsy and epileptic syndromes with simple partial seizures, not intractable, without status epilepticus: Secondary | ICD-10-CM | POA: Diagnosis not present

## 2014-09-04 DIAGNOSIS — I69920 Aphasia following unspecified cerebrovascular disease: Secondary | ICD-10-CM | POA: Diagnosis not present

## 2014-09-04 DIAGNOSIS — M109 Gout, unspecified: Secondary | ICD-10-CM | POA: Diagnosis not present

## 2014-09-04 DIAGNOSIS — I999 Unspecified disorder of circulatory system: Secondary | ICD-10-CM | POA: Diagnosis not present

## 2014-09-04 DIAGNOSIS — Z8673 Personal history of transient ischemic attack (TIA), and cerebral infarction without residual deficits: Secondary | ICD-10-CM | POA: Diagnosis not present

## 2014-09-04 DIAGNOSIS — J449 Chronic obstructive pulmonary disease, unspecified: Secondary | ICD-10-CM | POA: Diagnosis not present

## 2014-09-04 DIAGNOSIS — I10 Essential (primary) hypertension: Secondary | ICD-10-CM | POA: Diagnosis not present

## 2014-09-08 DIAGNOSIS — I69920 Aphasia following unspecified cerebrovascular disease: Secondary | ICD-10-CM | POA: Diagnosis not present

## 2014-09-08 DIAGNOSIS — I10 Essential (primary) hypertension: Secondary | ICD-10-CM | POA: Diagnosis not present

## 2014-09-08 DIAGNOSIS — G40109 Localization-related (focal) (partial) symptomatic epilepsy and epileptic syndromes with simple partial seizures, not intractable, without status epilepticus: Secondary | ICD-10-CM | POA: Diagnosis not present

## 2014-09-08 DIAGNOSIS — I999 Unspecified disorder of circulatory system: Secondary | ICD-10-CM | POA: Diagnosis not present

## 2014-09-08 DIAGNOSIS — J449 Chronic obstructive pulmonary disease, unspecified: Secondary | ICD-10-CM | POA: Diagnosis not present

## 2014-09-08 DIAGNOSIS — M109 Gout, unspecified: Secondary | ICD-10-CM | POA: Diagnosis not present

## 2014-09-08 DIAGNOSIS — Z8673 Personal history of transient ischemic attack (TIA), and cerebral infarction without residual deficits: Secondary | ICD-10-CM | POA: Diagnosis not present

## 2014-09-09 DIAGNOSIS — I10 Essential (primary) hypertension: Secondary | ICD-10-CM | POA: Diagnosis not present

## 2014-09-09 DIAGNOSIS — I69920 Aphasia following unspecified cerebrovascular disease: Secondary | ICD-10-CM | POA: Diagnosis not present

## 2014-09-09 DIAGNOSIS — Z8673 Personal history of transient ischemic attack (TIA), and cerebral infarction without residual deficits: Secondary | ICD-10-CM | POA: Diagnosis not present

## 2014-09-09 DIAGNOSIS — M109 Gout, unspecified: Secondary | ICD-10-CM | POA: Diagnosis not present

## 2014-09-09 DIAGNOSIS — G40109 Localization-related (focal) (partial) symptomatic epilepsy and epileptic syndromes with simple partial seizures, not intractable, without status epilepticus: Secondary | ICD-10-CM | POA: Diagnosis not present

## 2014-09-09 DIAGNOSIS — J449 Chronic obstructive pulmonary disease, unspecified: Secondary | ICD-10-CM | POA: Diagnosis not present

## 2014-09-09 DIAGNOSIS — I999 Unspecified disorder of circulatory system: Secondary | ICD-10-CM | POA: Diagnosis not present

## 2014-09-11 DIAGNOSIS — M109 Gout, unspecified: Secondary | ICD-10-CM | POA: Diagnosis not present

## 2014-09-11 DIAGNOSIS — I999 Unspecified disorder of circulatory system: Secondary | ICD-10-CM | POA: Diagnosis not present

## 2014-09-11 DIAGNOSIS — J449 Chronic obstructive pulmonary disease, unspecified: Secondary | ICD-10-CM | POA: Diagnosis not present

## 2014-09-11 DIAGNOSIS — Z8673 Personal history of transient ischemic attack (TIA), and cerebral infarction without residual deficits: Secondary | ICD-10-CM | POA: Diagnosis not present

## 2014-09-11 DIAGNOSIS — G40109 Localization-related (focal) (partial) symptomatic epilepsy and epileptic syndromes with simple partial seizures, not intractable, without status epilepticus: Secondary | ICD-10-CM | POA: Diagnosis not present

## 2014-09-11 DIAGNOSIS — I10 Essential (primary) hypertension: Secondary | ICD-10-CM | POA: Diagnosis not present

## 2014-09-11 DIAGNOSIS — I69920 Aphasia following unspecified cerebrovascular disease: Secondary | ICD-10-CM | POA: Diagnosis not present

## 2014-09-12 DIAGNOSIS — I739 Peripheral vascular disease, unspecified: Secondary | ICD-10-CM | POA: Diagnosis not present

## 2014-09-13 DIAGNOSIS — M109 Gout, unspecified: Secondary | ICD-10-CM | POA: Diagnosis not present

## 2014-09-13 DIAGNOSIS — I999 Unspecified disorder of circulatory system: Secondary | ICD-10-CM | POA: Diagnosis not present

## 2014-09-13 DIAGNOSIS — I69920 Aphasia following unspecified cerebrovascular disease: Secondary | ICD-10-CM | POA: Diagnosis not present

## 2014-09-13 DIAGNOSIS — G40109 Localization-related (focal) (partial) symptomatic epilepsy and epileptic syndromes with simple partial seizures, not intractable, without status epilepticus: Secondary | ICD-10-CM | POA: Diagnosis not present

## 2014-09-13 DIAGNOSIS — I10 Essential (primary) hypertension: Secondary | ICD-10-CM | POA: Diagnosis not present

## 2014-09-13 DIAGNOSIS — Z8673 Personal history of transient ischemic attack (TIA), and cerebral infarction without residual deficits: Secondary | ICD-10-CM | POA: Diagnosis not present

## 2014-09-13 DIAGNOSIS — J449 Chronic obstructive pulmonary disease, unspecified: Secondary | ICD-10-CM | POA: Diagnosis not present

## 2014-09-15 DIAGNOSIS — I999 Unspecified disorder of circulatory system: Secondary | ICD-10-CM | POA: Diagnosis not present

## 2014-09-15 DIAGNOSIS — M109 Gout, unspecified: Secondary | ICD-10-CM | POA: Diagnosis not present

## 2014-09-15 DIAGNOSIS — I10 Essential (primary) hypertension: Secondary | ICD-10-CM | POA: Diagnosis not present

## 2014-09-15 DIAGNOSIS — I69920 Aphasia following unspecified cerebrovascular disease: Secondary | ICD-10-CM | POA: Diagnosis not present

## 2014-09-15 DIAGNOSIS — J449 Chronic obstructive pulmonary disease, unspecified: Secondary | ICD-10-CM | POA: Diagnosis not present

## 2014-09-15 DIAGNOSIS — Z8673 Personal history of transient ischemic attack (TIA), and cerebral infarction without residual deficits: Secondary | ICD-10-CM | POA: Diagnosis not present

## 2014-09-15 DIAGNOSIS — G40109 Localization-related (focal) (partial) symptomatic epilepsy and epileptic syndromes with simple partial seizures, not intractable, without status epilepticus: Secondary | ICD-10-CM | POA: Diagnosis not present

## 2014-09-18 DIAGNOSIS — I999 Unspecified disorder of circulatory system: Secondary | ICD-10-CM | POA: Diagnosis not present

## 2014-09-18 DIAGNOSIS — I10 Essential (primary) hypertension: Secondary | ICD-10-CM | POA: Diagnosis not present

## 2014-09-18 DIAGNOSIS — Z8673 Personal history of transient ischemic attack (TIA), and cerebral infarction without residual deficits: Secondary | ICD-10-CM | POA: Diagnosis not present

## 2014-09-18 DIAGNOSIS — G40109 Localization-related (focal) (partial) symptomatic epilepsy and epileptic syndromes with simple partial seizures, not intractable, without status epilepticus: Secondary | ICD-10-CM | POA: Diagnosis not present

## 2014-09-18 DIAGNOSIS — J449 Chronic obstructive pulmonary disease, unspecified: Secondary | ICD-10-CM | POA: Diagnosis not present

## 2014-09-18 DIAGNOSIS — I69920 Aphasia following unspecified cerebrovascular disease: Secondary | ICD-10-CM | POA: Diagnosis not present

## 2014-09-18 DIAGNOSIS — M109 Gout, unspecified: Secondary | ICD-10-CM | POA: Diagnosis not present

## 2014-09-22 DIAGNOSIS — I999 Unspecified disorder of circulatory system: Secondary | ICD-10-CM | POA: Diagnosis not present

## 2014-09-22 DIAGNOSIS — M109 Gout, unspecified: Secondary | ICD-10-CM | POA: Diagnosis not present

## 2014-09-22 DIAGNOSIS — I69920 Aphasia following unspecified cerebrovascular disease: Secondary | ICD-10-CM | POA: Diagnosis not present

## 2014-09-22 DIAGNOSIS — I10 Essential (primary) hypertension: Secondary | ICD-10-CM | POA: Diagnosis not present

## 2014-09-22 DIAGNOSIS — Z8673 Personal history of transient ischemic attack (TIA), and cerebral infarction without residual deficits: Secondary | ICD-10-CM | POA: Diagnosis not present

## 2014-09-22 DIAGNOSIS — G40109 Localization-related (focal) (partial) symptomatic epilepsy and epileptic syndromes with simple partial seizures, not intractable, without status epilepticus: Secondary | ICD-10-CM | POA: Diagnosis not present

## 2014-09-22 DIAGNOSIS — J449 Chronic obstructive pulmonary disease, unspecified: Secondary | ICD-10-CM | POA: Diagnosis not present

## 2014-09-23 DIAGNOSIS — I999 Unspecified disorder of circulatory system: Secondary | ICD-10-CM | POA: Diagnosis not present

## 2014-09-23 DIAGNOSIS — M109 Gout, unspecified: Secondary | ICD-10-CM | POA: Diagnosis not present

## 2014-09-23 DIAGNOSIS — G40109 Localization-related (focal) (partial) symptomatic epilepsy and epileptic syndromes with simple partial seizures, not intractable, without status epilepticus: Secondary | ICD-10-CM | POA: Diagnosis not present

## 2014-09-23 DIAGNOSIS — I69920 Aphasia following unspecified cerebrovascular disease: Secondary | ICD-10-CM | POA: Diagnosis not present

## 2014-09-23 DIAGNOSIS — Z8673 Personal history of transient ischemic attack (TIA), and cerebral infarction without residual deficits: Secondary | ICD-10-CM | POA: Diagnosis not present

## 2014-09-23 DIAGNOSIS — J449 Chronic obstructive pulmonary disease, unspecified: Secondary | ICD-10-CM | POA: Diagnosis not present

## 2014-09-23 DIAGNOSIS — I10 Essential (primary) hypertension: Secondary | ICD-10-CM | POA: Diagnosis not present

## 2014-10-25 ENCOUNTER — Other Ambulatory Visit: Payer: Self-pay | Admitting: Diagnostic Neuroimaging

## 2014-10-25 MED ORDER — LACOSAMIDE 200 MG PO TABS: 200.0000 mg | ORAL_TABLET | Freq: Two times a day (BID) | ORAL | Status: DC

## 2014-10-25 NOTE — Telephone Encounter (Signed)
Wife [name] called to request refill of lacosamide (VIMPAT) 200 MG TABS tablet

## 2014-10-26 ENCOUNTER — Other Ambulatory Visit: Payer: Self-pay | Admitting: Diagnostic Neuroimaging

## 2014-10-26 NOTE — Telephone Encounter (Signed)
Rx has been signed and faxed  

## 2014-11-03 DIAGNOSIS — Z23 Encounter for immunization: Secondary | ICD-10-CM | POA: Diagnosis not present

## 2014-11-03 DIAGNOSIS — G40909 Epilepsy, unspecified, not intractable, without status epilepticus: Secondary | ICD-10-CM | POA: Diagnosis not present

## 2014-11-03 DIAGNOSIS — J449 Chronic obstructive pulmonary disease, unspecified: Secondary | ICD-10-CM | POA: Diagnosis not present

## 2014-11-03 DIAGNOSIS — I10 Essential (primary) hypertension: Secondary | ICD-10-CM | POA: Diagnosis not present

## 2014-11-08 ENCOUNTER — Ambulatory Visit (INDEPENDENT_AMBULATORY_CARE_PROVIDER_SITE_OTHER): Payer: Medicare Other | Admitting: Diagnostic Neuroimaging

## 2014-11-08 ENCOUNTER — Encounter: Payer: Self-pay | Admitting: Diagnostic Neuroimaging

## 2014-11-08 VITALS — BP 112/74 | HR 69 | Ht 68.0 in | Wt 164.8 lb

## 2014-11-08 DIAGNOSIS — G40109 Localization-related (focal) (partial) symptomatic epilepsy and epileptic syndromes with simple partial seizures, not intractable, without status epilepticus: Secondary | ICD-10-CM | POA: Diagnosis not present

## 2014-11-08 DIAGNOSIS — R413 Other amnesia: Secondary | ICD-10-CM | POA: Diagnosis not present

## 2014-11-08 MED ORDER — LEVETIRACETAM 750 MG PO TABS: 1500.0000 mg | ORAL_TABLET | Freq: Two times a day (BID) | ORAL | Status: DC

## 2014-11-08 MED ORDER — LACOSAMIDE 200 MG PO TABS: 200.0000 mg | ORAL_TABLET | Freq: Two times a day (BID) | ORAL | Status: DC

## 2014-11-08 MED ORDER — CARBAMAZEPINE 200 MG PO TABS: 400.0000 mg | ORAL_TABLET | Freq: Two times a day (BID) | ORAL | Status: DC
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
## 2014-11-08 NOTE — Patient Instructions (Signed)
Thank you for coming to see [place] at [facility]. I hope we have been able to provide you high quality care today.  You may receive a patient satisfaction survey over the next few weeks. We would appreciate your feedback and comments so that we may continue to improve ourselves and the health of our patients.  1. Continue vimpat 211m BID and carbamazepine 4026mBID 2. INCREASE levetiracetam back up to 150062mwice a day 3. No driving; patient needs 24 hour supervision 4. Secondary stroke prevention (plavix, statin, BP control, sugar control)   ~~~~~~~~~~~~~~~~~~~~~~~~~~~~~~~~~~~~~~~~~~~~~~~~~~~~~~~~~~~~~~~~~  DR. PENUMALLI'S GUIDE TO HAPPY AND HEALTHY LIVING These are some of my general health and wellness recommendations. Some of them may apply to you better than others. Please use common sense as you try these suggestions and feel free to ask me any questions.   ACTIVITY/FITNESS Mental, social, emotional and physical stimulation are very important for brain and body health. Try learning a new activity (arts, music, language, sports, games).  Keep moving your body to the best of your abilities.    NUTRITION Eat more plants: colorful vegetables, nuts, seeds and berries.  Eat less sugar, salt, preservatives and processed foods.  Avoid toxins such as cigarettes and alcohol.  Drink water when you are thirsty. Warm water with a slice of lemon is an excellent morning drink to start the day.  Consider these websites for more information The Nutrition Source (htthttps://www.henry-hernandez.biz/recision Nutrition (wwwWindowBlog.ch RELAXATION Consider practicing mindfulness meditation or other relaxation techniques such as deep breathing, prayer, yoga, tai chi, massage.   SLEEP Try to get at least 7-8+ hours sleep per day. Regular exercise and reduced caffeine will help you sleep better. Practice good sleep hygeine  techniques. See website sleep.org for more information.   PLANNING Prepare estate planning, living will, healthcare POA documents. Sometimes this is best planned with the help of an attorney. Theconversationproject.org and agingwithdignity.org are excellent resources.

## 2014-11-11 ENCOUNTER — Other Ambulatory Visit: Payer: Self-pay | Admitting: Diagnostic Neuroimaging

## 2014-12-07 ENCOUNTER — Other Ambulatory Visit: Payer: Self-pay | Admitting: Diagnostic Neuroimaging

## 2014-12-08 ENCOUNTER — Telehealth: Payer: Self-pay | Admitting: Diagnostic Neuroimaging

## 2014-12-08 NOTE — Telephone Encounter (Signed)
Patient called to check status of Prior Auth for lacosamide (VIMPAT) 200 MG TABS tablet

## 2014-12-08 NOTE — Telephone Encounter (Signed)
I called the pharmacy and spoke with pharmacist who said a prior auth was not needed on this drug.  The patient was requesting a refill auth, however they see that we already sent #[phone] + 5 refills on 10/31, so nothing further is needed.  I called the patient back.  They are aware.

## 2015-04-20 DIAGNOSIS — J449 Chronic obstructive pulmonary disease, unspecified: Secondary | ICD-10-CM | POA: Diagnosis not present

## 2015-04-20 DIAGNOSIS — G40909 Epilepsy, unspecified, not intractable, without status epilepticus: Secondary | ICD-10-CM | POA: Diagnosis not present

## 2015-04-20 DIAGNOSIS — I10 Essential (primary) hypertension: Secondary | ICD-10-CM | POA: Diagnosis not present

## 2015-04-20 DIAGNOSIS — E785 Hyperlipidemia, unspecified: Secondary | ICD-10-CM | POA: Diagnosis not present

## 2015-05-02 ENCOUNTER — Ambulatory Visit: Payer: Self-pay | Admitting: Diagnostic Neuroimaging

## 2015-05-26 ENCOUNTER — Ambulatory Visit: Payer: Self-pay | Admitting: Diagnostic Neuroimaging

## 2015-06-14 ENCOUNTER — Ambulatory Visit: Payer: Self-pay | Admitting: Diagnostic Neuroimaging

## 2015-07-04 ENCOUNTER — Encounter: Payer: Self-pay | Admitting: Diagnostic Neuroimaging

## 2015-07-04 ENCOUNTER — Ambulatory Visit (INDEPENDENT_AMBULATORY_CARE_PROVIDER_SITE_OTHER): Payer: Medicare Other | Admitting: Diagnostic Neuroimaging

## 2015-07-04 VITALS — BP 109/75 | HR 89 | Ht 68.0 in | Wt 158.8 lb

## 2015-07-04 DIAGNOSIS — R413 Other amnesia: Secondary | ICD-10-CM | POA: Diagnosis not present

## 2015-07-04 DIAGNOSIS — G40109 Localization-related (focal) (partial) symptomatic epilepsy and epileptic syndromes with simple partial seizures, not intractable, without status epilepticus: Secondary | ICD-10-CM | POA: Diagnosis not present

## 2015-07-04 DIAGNOSIS — I6930 Unspecified sequelae of cerebral infarction: Secondary | ICD-10-CM

## 2015-07-04 DIAGNOSIS — Z8673 Personal history of transient ischemic attack (TIA), and cerebral infarction without residual deficits: Secondary | ICD-10-CM

## 2015-07-04 MED ORDER — LEVETIRACETAM 750 MG PO TABS: 1500.0000 mg | ORAL_TABLET | Freq: Two times a day (BID) | ORAL | Status: DC

## 2015-07-04 MED ORDER — LACOSAMIDE 200 MG PO TABS: 200.0000 mg | ORAL_TABLET | Freq: Two times a day (BID) | ORAL | Status: DC

## 2015-07-04 MED ORDER — CARBAMAZEPINE 200 MG PO TABS: 400.0000 mg | ORAL_TABLET | Freq: Two times a day (BID) | ORAL | Status: DC
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
## 2015-07-04 NOTE — Patient Instructions (Signed)
-   continue vimpat 200mg  twice a day  - continue carbamazepine 400mg  twice a day  - continue levetiracetam 1500mg  twice a day

## 2015-09-14 DIAGNOSIS — I10 Essential (primary) hypertension: Secondary | ICD-10-CM | POA: Diagnosis not present

## 2015-09-14 DIAGNOSIS — Z125 Encounter for screening for malignant neoplasm of prostate: Secondary | ICD-10-CM | POA: Diagnosis not present

## 2015-09-15 DIAGNOSIS — N390 Urinary tract infection, site not specified: Secondary | ICD-10-CM | POA: Diagnosis not present

## 2015-09-15 DIAGNOSIS — Z125 Encounter for screening for malignant neoplasm of prostate: Secondary | ICD-10-CM | POA: Diagnosis not present

## 2015-09-15 DIAGNOSIS — I10 Essential (primary) hypertension: Secondary | ICD-10-CM | POA: Diagnosis not present

## 2015-12-30 ENCOUNTER — Encounter (HOSPITAL_COMMUNITY): Payer: Self-pay

## 2015-12-30 ENCOUNTER — Emergency Department (HOSPITAL_COMMUNITY): Payer: Medicare Other

## 2015-12-30 ENCOUNTER — Observation Stay (HOSPITAL_COMMUNITY)
Admission: EM | Admit: 2015-12-30 | Discharge: 2016-01-01 | Disposition: A | Discharge status: Home / Self Care | Payer: Medicare Other | Attending: Internal Medicine | Admitting: Internal Medicine

## 2015-12-30 DIAGNOSIS — R0789 Other chest pain: Secondary | ICD-10-CM | POA: Diagnosis not present

## 2015-12-30 DIAGNOSIS — Z8673 Personal history of transient ischemic attack (TIA), and cerebral infarction without residual deficits: Secondary | ICD-10-CM | POA: Insufficient documentation

## 2015-12-30 DIAGNOSIS — F0390 Unspecified dementia without behavioral disturbance: Secondary | ICD-10-CM | POA: Diagnosis not present

## 2015-12-30 DIAGNOSIS — Z87891 Personal history of nicotine dependence: Secondary | ICD-10-CM | POA: Diagnosis not present

## 2015-12-30 DIAGNOSIS — R748 Abnormal levels of other serum enzymes: Secondary | ICD-10-CM | POA: Diagnosis not present

## 2015-12-30 DIAGNOSIS — R079 Chest pain, unspecified: Secondary | ICD-10-CM | POA: Diagnosis present

## 2015-12-30 DIAGNOSIS — I10 Essential (primary) hypertension: Secondary | ICD-10-CM | POA: Diagnosis not present

## 2015-12-30 DIAGNOSIS — R7989 Other specified abnormal findings of blood chemistry: Secondary | ICD-10-CM | POA: Diagnosis not present

## 2015-12-30 DIAGNOSIS — J449 Chronic obstructive pulmonary disease, unspecified: Secondary | ICD-10-CM | POA: Diagnosis not present

## 2015-12-30 DIAGNOSIS — R569 Unspecified convulsions: Secondary | ICD-10-CM

## 2015-12-30 DIAGNOSIS — G40909 Epilepsy, unspecified, not intractable, without status epilepticus: Secondary | ICD-10-CM

## 2015-12-30 DIAGNOSIS — E785 Hyperlipidemia, unspecified: Secondary | ICD-10-CM | POA: Diagnosis present

## 2015-12-30 DIAGNOSIS — R778 Other specified abnormalities of plasma proteins: Secondary | ICD-10-CM | POA: Diagnosis present

## 2015-12-30 DIAGNOSIS — I739 Peripheral vascular disease, unspecified: Secondary | ICD-10-CM | POA: Diagnosis present

## 2015-12-30 LAB — CBC
HCT: 42 % (ref 39.0–52.0)
HEMATOCRIT: 42.2 % (ref 39.0–52.0)
HEMOGLOBIN: 14.7 g/dL (ref 13.0–17.0)
Hemoglobin: 14.3 g/dL (ref 13.0–17.0)
MCH: 32.1 pg (ref 26.0–34.0)
MCH: 31.4 pg (ref 26.0–34.0)
MCHC: 35 g/dL (ref 30.0–36.0)
MCHC: 33.9 g/dL (ref 30.0–36.0)
MCV: 91.7 fL (ref 78.0–100.0)
MCV: 92.7 fL (ref 78.0–100.0)
PLATELETS: 260 10*3/uL (ref 150–400)
Platelets: 242 10*3/uL (ref 150–400)
RBC: 4.58 MIL/uL (ref 4.22–5.81)
RBC: 4.55 MIL/uL (ref 4.22–5.81)
RDW: 14.1 % (ref 11.5–15.5)
RDW: 14.4 % (ref 11.5–15.5)
WBC: 13 10*3/uL — ABNORMAL HIGH (ref 4.0–10.5)
WBC: 12.2 10*3/uL — AB (ref 4.0–10.5)

## 2015-12-30 LAB — COMPREHENSIVE METABOLIC PANEL
ALK PHOS: 90 U/L (ref 38–126)
ALT: 22 U/L (ref 17–63)
AST: 27 U/L (ref 15–41)
Albumin: 3.9 g/dL (ref 3.5–5.0)
Anion gap: 9 (ref 5–15)
BUN: 12 mg/dL (ref 6–20)
CALCIUM: 8.8 mg/dL — AB (ref 8.9–10.3)
CO2: 23 mmol/L (ref 22–32)
CREATININE: 1.08 mg/dL (ref 0.61–1.24)
Chloride: 100 mmol/L — ABNORMAL LOW (ref 101–111)
GFR calc non Af Amer: >60 mL/min (ref >60)
Glucose, Bld: 120 mg/dL — ABNORMAL HIGH (ref 65–99)
Potassium: 4.2 mmol/L (ref 3.5–5.1)
SODIUM: 132 mmol/L — AB (ref 135–145)
Total Bilirubin: 0.3 mg/dL (ref 0.3–1.2)
Total Protein: 7 g/dL (ref 6.5–8.1)

## 2015-12-30 LAB — TROPONIN I
TROPONIN I: 0.04 ng/mL — AB (ref <0.03)
Troponin I: 0.06 ng/mL (ref <0.03)
Troponin I: 0.06 ng/mL (ref <0.03)
Troponin I: 0.05 ng/mL (ref <0.03)

## 2015-12-30 LAB — PROTIME-INR
INR: 1.17
Prothrombin Time: 14.9 seconds (ref 11.4–15.2)

## 2015-12-30 LAB — CREATININE, SERUM
Creatinine, Ser: 1.08 mg/dL (ref 0.61–1.24)
GFR calc non Af Amer: >60 mL/min (ref >60)

## 2015-12-30 LAB — MAGNESIUM: Magnesium: 1.8 mg/dL (ref 1.7–2.4)

## 2015-12-30 MED ORDER — GI COCKTAIL ~~LOC~~: 30.0000 mL | Freq: Four times a day (QID) | ORAL | Status: DC | PRN

## 2015-12-30 MED ORDER — ASPIRIN 81 MG PO CHEW: 324.0000 mg | CHEWABLE_TABLET | Freq: Once | ORAL | Status: DC

## 2015-12-30 MED ORDER — ACETAMINOPHEN 325 MG PO TABS
650.0000 mg | ORAL_TABLET | ORAL | Status: DC | PRN
Start: 2015-12-30 — End: 2016-01-01

## 2015-12-30 MED ORDER — HYDRALAZINE HCL 20 MG/ML IJ SOLN: 10.0000 mg | Freq: Three times a day (TID) | INTRAMUSCULAR | Status: DC | PRN

## 2015-12-30 MED ORDER — ENOXAPARIN SODIUM 40 MG/0.4ML ~~LOC~~ SOLN
40.0000 mg | SUBCUTANEOUS | Status: DC
  Administered 2015-12-30 – 2015-12-31 (×2): 40 mg via SUBCUTANEOUS
  Filled 2015-12-30 (×2): qty 0.4

## 2015-12-30 MED ORDER — LEVETIRACETAM 750 MG PO TABS
1500.0000 mg | ORAL_TABLET | Freq: Two times a day (BID) | ORAL | Status: DC
  Administered 2015-12-30 – 2016-01-01 (×4): 1500 mg via ORAL
  Filled 2015-12-30 (×4): qty 2

## 2015-12-30 MED ORDER — MORPHINE SULFATE (PF) 4 MG/ML IV SOLN
2.0000 mg | INTRAVENOUS | Status: DC | PRN
  Administered 2015-12-31: 2 mg via INTRAVENOUS
  Filled 2015-12-30: qty 1

## 2015-12-30 MED ORDER — SODIUM CHLORIDE 0.9 % IV SOLN
INTRAVENOUS | Status: DC
  Administered 2015-12-30: 21:00:00 via INTRAVENOUS

## 2015-12-30 MED ORDER — COLCHICINE 0.6 MG PO TABS: 0.6000 mg | ORAL_TABLET | Freq: Two times a day (BID) | ORAL | Status: DC | PRN

## 2015-12-30 MED ORDER — NITROGLYCERIN 0.4 MG SL SUBL: 0.4000 mg | SUBLINGUAL_TABLET | SUBLINGUAL | Status: DC | PRN

## 2015-12-30 MED ORDER — ATORVASTATIN CALCIUM 40 MG PO TABS
40.0000 mg | ORAL_TABLET | Freq: Every day | ORAL | Status: DC
  Administered 2015-12-31 – 2016-01-01 (×2): 40 mg via ORAL
  Filled 2015-12-30 (×2): qty 1

## 2015-12-30 MED ORDER — LISINOPRIL 20 MG PO TABS
20.0000 mg | ORAL_TABLET | Freq: Every day | ORAL | Status: DC
  Administered 2015-12-31 – 2016-01-01 (×2): 20 mg via ORAL
  Filled 2015-12-30 (×2): qty 1

## 2015-12-30 MED ORDER — CARBAMAZEPINE 200 MG PO TABS
400.0000 mg | ORAL_TABLET | Freq: Two times a day (BID) | ORAL | Status: DC
  Administered 2015-12-30 – 2016-01-01 (×4): 400 mg via ORAL
  Filled 2015-12-30 (×4): qty 2

## 2015-12-30 MED ORDER — LACOSAMIDE 200 MG PO TABS
200.0000 mg | ORAL_TABLET | Freq: Two times a day (BID) | ORAL | Status: DC
  Administered 2015-12-30 – 2016-01-01 (×4): 200 mg via ORAL
  Filled 2015-12-30: qty 4
  Filled 2015-12-30 (×3): qty 1

## 2015-12-30 MED ORDER — ONDANSETRON HCL 4 MG/2ML IJ SOLN: 4.0000 mg | Freq: Four times a day (QID) | INTRAMUSCULAR | Status: DC | PRN

## 2015-12-30 MED ORDER — LORAZEPAM 2 MG/ML IJ SOLN: 2.0000 mg | INTRAMUSCULAR | Status: DC | PRN

## 2015-12-30 MED ORDER — ZOLPIDEM TARTRATE 5 MG PO TABS: 5.0000 mg | ORAL_TABLET | Freq: Every evening | ORAL | Status: DC | PRN

## 2015-12-30 MED ORDER — CLOPIDOGREL BISULFATE 75 MG PO TABS
75.0000 mg | ORAL_TABLET | Freq: Every day | ORAL | Status: DC
  Administered 2015-12-31 – 2016-01-01 (×2): 75 mg via ORAL
  Filled 2015-12-30 (×2): qty 1

## 2015-12-30 NOTE — ED Notes (Signed)
Around 1800 this nurse notified secretary to call admitting for no bed orders. No returned call from admitting, [facility] called this nurse and stated that the dr was gone and they would try to get in touch with another dr for bed request.
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
## 2015-12-30 NOTE — ED Notes (Signed)
Lab at bedside

## 2015-12-30 NOTE — ED Triage Notes (Signed)
[facility] chest pain no chest pain on arrival. Pt is oriented x 1 per baseline.Aspirin 324 given by EMS.
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
## 2015-12-31 ENCOUNTER — Observation Stay (HOSPITAL_BASED_OUTPATIENT_CLINIC_OR_DEPARTMENT_OTHER): Payer: Medicare Other

## 2015-12-31 DIAGNOSIS — G40909 Epilepsy, unspecified, not intractable, without status epilepticus: Secondary | ICD-10-CM | POA: Diagnosis not present

## 2015-12-31 DIAGNOSIS — I208 Other forms of angina pectoris: Secondary | ICD-10-CM | POA: Diagnosis not present

## 2015-12-31 DIAGNOSIS — I739 Peripheral vascular disease, unspecified: Secondary | ICD-10-CM

## 2015-12-31 DIAGNOSIS — R748 Abnormal levels of other serum enzymes: Secondary | ICD-10-CM

## 2015-12-31 DIAGNOSIS — R0789 Other chest pain: Secondary | ICD-10-CM | POA: Diagnosis not present

## 2015-12-31 DIAGNOSIS — J449 Chronic obstructive pulmonary disease, unspecified: Secondary | ICD-10-CM | POA: Diagnosis not present

## 2015-12-31 DIAGNOSIS — I10 Essential (primary) hypertension: Secondary | ICD-10-CM | POA: Diagnosis not present

## 2015-12-31 LAB — BASIC METABOLIC PANEL
Anion gap: 9 (ref 5–15)
BUN: 8 mg/dL (ref 6–20)
CHLORIDE: 101 mmol/L (ref 101–111)
CO2: 24 mmol/L (ref 22–32)
Calcium: 8.4 mg/dL — ABNORMAL LOW (ref 8.9–10.3)
Creatinine, Ser: 0.78 mg/dL (ref 0.61–1.24)
GFR calc non Af Amer: >60 mL/min (ref >60)
Glucose, Bld: 91 mg/dL (ref 65–99)
POTASSIUM: 3.8 mmol/L (ref 3.5–5.1)
SODIUM: 134 mmol/L — AB (ref 135–145)

## 2015-12-31 LAB — ECHOCARDIOGRAM COMPLETE
CHL CUP MV DEC (S): 229
CHL CUP RV SYS PRESS: 26 mmHg
EERAT: 9.48
EWDT: 229 ms
FS: 34 % (ref 28–44)
IVS/LV PW RATIO, ED: 0.81
LA ID, A-P, ES: 25 mm
LA diam index: 1.37 cm/m2
LA vol index: 16.1 mL/m2
LA vol: 29.4 mL
LAVOLA4C: 26.4 mL
LDCA: 3.14 cm2
LEFT ATRIUM END SYS DIAM: 25 mm
LV E/e'average: 9.48
LV PW d: 8.95 mm — AB (ref 0.6–1.1)
LV TDI E'LATERAL: 11.5
LV TDI E'MEDIAL: 9.17
LV e' LATERAL: 11.5 cm/s
LVEEMED: 9.48
LVOTD: 20 mm
MV pk A vel: 76.2 m/s
MV pk E vel: 109 m/s
MVPG: 5 mmHg
RV LATERAL S' VELOCITY: 10.6 cm/s
RV TAPSE: 21.9 mm
Reg peak vel: 240 cm/s
TR max vel: 240 cm/s
Weight: 2452.8 oz

## 2015-12-31 LAB — CBC WITH DIFFERENTIAL/PLATELET
BASOS PCT: 0 %
Basophils Absolute: 0 10*3/uL (ref 0.0–0.1)
EOS ABS: 0 10*3/uL (ref 0.0–0.7)
Eosinophils Relative: 0 %
HEMATOCRIT: 39.7 % (ref 39.0–52.0)
HEMOGLOBIN: 13.6 g/dL (ref 13.0–17.0)
LYMPHS ABS: 2.7 10*3/uL (ref 0.7–4.0)
Lymphocytes Relative: 32 %
MCH: 31.4 pg (ref 26.0–34.0)
MCHC: 34.3 g/dL (ref 30.0–36.0)
MCV: 91.7 fL (ref 78.0–100.0)
Monocytes Absolute: 0.7 10*3/uL (ref 0.1–1.0)
Monocytes Relative: 9 %
NEUTROS ABS: 4.8 10*3/uL (ref 1.7–7.7)
NEUTROS PCT: 59 %
Platelets: 218 10*3/uL (ref 150–400)
RBC: 4.33 MIL/uL (ref 4.22–5.81)
RDW: 14.4 % (ref 11.5–15.5)
WBC: 8.2 10*3/uL (ref 4.0–10.5)

## 2015-12-31 NOTE — Progress Notes (Signed)
Upon arrival to room, patient was informed that he is in a camera surveillance room for safety purposes. Patient verbalized understanding and gave his consent.

## 2015-12-31 NOTE — Progress Notes (Signed)
The clients wife is here this morning and she has been asked for consent for safety camera monitoring for a second consent after the client has given consent due to dementia.

## 2015-12-31 NOTE — Progress Notes (Signed)
  Echocardiogram 2D Echocardiogram has been performed.  [name], Lee [name] 12/31/2015, 5:47 PM
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
## 2015-12-31 NOTE — Progress Notes (Signed)
Patient ID: Lee [name], male   DOB: 1950-03-03, 65 y.o.   MRN: [id number]    PROGRESS NOTE    ZADE NORDHOFF  ION:[id number] DOB: 23-May-1950 DOA: 12/30/2015  PCP: [name], MD   Brief Narrative:   65 y.o. male  with past medical history significant for femoral aneurysm, COPD, stroke, hypertension and peripheral vascular disease, presented to ED with chest pain. Please note that wife at bedside reported pt with severe dementia after stroke and is unreliable historian.   Assessment & Plan:   Principal Problem:   Elevated troponin, chest pain - troponins mildly elevated, flat - pt denies chest pain this AM - ECHO pending at this time - further recommendations pending ECHO results - keep on tele for now - advance diet as no plan for stress test at this time   Active Problems:   PAD (peripheral artery disease) (HCC) - continue Aspirin, Plavix, Statin     Seizure disorder (HCC) - stable at this time - continue Keppra     Hyperlipidemia - continue statin   DVT prophylaxis: Lovenox SQ Code Status: Full  Family Communication: Patient and wife at bedside  Disposition Plan: Today or AM depends on ECHO results   Consultants:   None  Procedures:   ECHO  Antimicrobials:   None  Subjective: No events overnight.   Objective: Vitals:   12/30/15 2043 12/30/15 2100 12/31/15 0530 12/31/15 0851  BP: (!) 145/84 124/81 132/80 116/77  Pulse: 85 83 89   Resp: 15 (!) 23 17   Temp: 98.4 F (36.9 C)  99.2 F (37.3 C)   TempSrc:      SpO2: 99% 100% 99%   Weight: 72 kg (158 lb 11.2 oz)  69.5 kg (153 lb 4.8 oz)     Intake/Output Summary (Last 24 hours) at 12/31/15 1043 Last data filed at 12/31/15 1000  Gross per 24 hour  Intake           661.67 ml  Output              400 ml  Net           261.67 ml   Filed Weights   12/30/15 1315 12/30/15 2043 12/31/15 0530  Weight: 77.1 kg (170 lb) 72 kg (158 lb 11.2 oz) 69.5 kg (153 lb 4.8 oz)     Examination:  General exam: Appears calm and comfortable  Respiratory system: Clear to auscultation. Respiratory effort normal. Cardiovascular system: S1 & S2 heard, RRR. No JVD, murmurs, rubs, gallops or clicks. No pedal edema. Gastrointestinal system: Abdomen is nondistended, soft and nontender. No organomegaly or masses felt.  Central nervous system: Alert and oriented. No focal neurological deficits.  Data Reviewed: I have personally reviewed following labs and imaging studies  CBC:  Recent Labs Lab 12/30/15 1335 12/30/15 1742 12/31/15 0535  WBC 13.0* 12.2* 8.2  NEUTROABS  --   --  4.8  HGB 14.7 14.3 13.6  HCT 42.0 42.2 39.7  MCV 91.7 92.7 91.7  PLT 242 260 218   Basic Metabolic Panel:  Recent Labs Lab 12/30/15 1335 12/30/15 1742 12/31/15 0535  NA 132*  --  134*  K 4.2  --  3.8  CL 100*  --  101  CO2 23  --  24  GLUCOSE 120*  --  91  BUN 12  --  8  CREATININE 1.08 1.08 0.78  CALCIUM 8.8*  --  8.4*  MG 1.8  --   --  Liver Function Tests:  Recent Labs Lab 12/30/15 1335  AST 27  ALT 22  ALKPHOS 90  BILITOT 0.3  PROT 7.0  ALBUMIN 3.9   Coagulation Profile:  Recent Labs Lab 12/30/15 1335  INR 1.17   Cardiac Enzymes:  Recent Labs Lab 12/30/15 1335 12/30/15 1742 12/30/15 1953 12/30/15 2139  TROPONINI 0.06* 0.04* 0.06* 0.05*    Radiology Studies: Dg Chest 2 View  Result Date: 12/30/2015 CLINICAL DATA:  Chest pain EXAM: CHEST  2 VIEW COMPARISON:  11/19/2013 FINDINGS: Cardiac shadow is stable. The lungs are well aerated bilaterally. No focal infiltrate or sizable effusion is seen. No acute bony abnormality is noted. IMPRESSION: No active cardiopulmonary disease. Electronically Signed   By: Alcide Clever M.D.   On: 12/30/2015 14:40   Scheduled Meds: . aspirin  324 mg Oral Once  . atorvastatin  40 mg Oral Daily  . carbamazepine  400 mg Oral BID  . clopidogrel  75 mg Oral Daily  . enoxaparin (LOVENOX) injection  40 mg Subcutaneous Q24H   . lacosamide  200 mg Oral BID  . levETIRAcetam  1,500 mg Oral BID  . lisinopril  20 mg Oral Daily   Continuous Infusions: . sodium chloride 50 mL/hr at 12/30/15 2046     LOS: 0 days   Time spent: 20 minutes   Debbora Presto, MD [facility] Pager [phone]  If 7PM-7AM, please contact night-coverage [email or web address] Password [facility] 12/31/2015, 10:43 AM

## 2016-01-01 DIAGNOSIS — R748 Abnormal levels of other serum enzymes: Secondary | ICD-10-CM | POA: Diagnosis not present

## 2016-01-01 DIAGNOSIS — R072 Precordial pain: Secondary | ICD-10-CM

## 2016-01-01 DIAGNOSIS — I10 Essential (primary) hypertension: Secondary | ICD-10-CM | POA: Diagnosis not present

## 2016-01-01 LAB — CBC
HEMATOCRIT: 39.3 % (ref 39.0–52.0)
Hemoglobin: 13.5 g/dL (ref 13.0–17.0)
MCH: 31.3 pg (ref 26.0–34.0)
MCHC: 34.4 g/dL (ref 30.0–36.0)
MCV: 91 fL (ref 78.0–100.0)
PLATELETS: 226 10*3/uL (ref 150–400)
RBC: 4.32 MIL/uL (ref 4.22–5.81)
RDW: 14 % (ref 11.5–15.5)
WBC: 5.4 10*3/uL (ref 4.0–10.5)

## 2016-01-01 LAB — BASIC METABOLIC PANEL
Anion gap: 9 (ref 5–15)
BUN: 10 mg/dL (ref 6–20)
CALCIUM: 8.6 mg/dL — AB (ref 8.9–10.3)
CO2: 23 mmol/L (ref 22–32)
CREATININE: 0.77 mg/dL (ref 0.61–1.24)
Chloride: 101 mmol/L (ref 101–111)
GFR calc Af Amer: >60 mL/min (ref >60)
Glucose, Bld: 88 mg/dL (ref 65–99)
Potassium: 3.8 mmol/L (ref 3.5–5.1)
SODIUM: 133 mmol/L — AB (ref 135–145)

## 2016-01-01 NOTE — Discharge Summary (Signed)
Physician Discharge Summary  Lee [name]:[id number] DOB: January 10, 1950 DOA: 12/30/2015  PCP: Lee Courier, MD  Admit date: 12/30/2015 Discharge date: 01/01/2016  Recommendations for Outpatient Follow-up:  1. Pt will need to follow up with PCP in 2-3 weeks post discharge 2. Please obtain BMP to evaluate electrolytes and kidney function 3. Please also check CBC to evaluate Hg and Hct levels  Discharge Diagnoses:  Principal Problem:   Elevated troponin Active Problems:   PAD (peripheral artery disease) (HCC)   Seizure disorder (HCC)   Hyperlipidemia  Discharge Condition: Stable  Diet recommendation: Heart healthy diet discussed in details   Brief Narrative:   65 y.o.malewith past medical history significant for femoral aneurysm, COPD, stroke, hypertension and peripheral vascular disease, presented to ED with chest pain. Please note that wife at bedside reported pt with severe dementia after stroke and is unreliable historian.   Assessment & Plan:   Principal Problem:   Elevated troponin, chest pain - troponins mildly elevated, flat - pt denies chest pain this AM - ECHO with stable EF, normal diastolic parameters  - cardiology team cleared for discharge as no plan for interventions   Active Problems:   PAD (peripheral artery disease) (HCC) - continue Plavix, Statin     Seizure disorder (HCC) - stable at this time - continue Keppra, Vimpat, Carbamazepine     Hyperlipidemia - continue statin   DVT prophylaxis: Lovenox SQ Code Status: Full  Family Communication: Patient and wife at bedside  Disposition Plan: Today   Consultants:   Cardiology   Procedures:   ECHO  Antimicrobials:   None  Procedures/Studies: Dg Chest 2 View  Result Date: 12/30/2015 CLINICAL DATA:  Chest pain EXAM: CHEST  2 VIEW COMPARISON:  11/19/2013 FINDINGS: Cardiac shadow is stable. The lungs are well aerated bilaterally. No focal infiltrate or sizable effusion  is seen. No acute bony abnormality is noted. IMPRESSION: No active cardiopulmonary disease. Electronically Signed   By: Alcide Clever M.D.   On: 12/30/2015 14:40    Discharge Exam: Vitals:   12/31/15 2036 01/01/16 0500  BP: 107/69 132/76  Pulse: 71 78  Resp: (!) 28 (!) 22  Temp: 99.8 F (37.7 C) 98.3 F (36.8 C)   Vitals:   12/31/15 0851 12/31/15 1500 12/31/15 2036 01/01/16 0500  BP: 116/77 111/69 107/69 132/76  Pulse:  73 71 78  Resp:  16 (!) 28 (!) 22  Temp:  99.2 F (37.3 C) 99.8 F (37.7 C) 98.3 F (36.8 C)  TempSrc:      SpO2:  98% 98% 96%  Weight:    69.5 kg (153 lb 4.8 oz)    General: Pt is alert, follows commands appropriately, not in acute distress Cardiovascular: Regular rate and rhythm, S1/S2 +, no rubs, no gallops Respiratory: Clear to auscultation bilaterally, no wheezing, no crackles, no rhonchi Abdominal: Soft, non tender, non distended, bowel sounds +, no guarding  Discharge Instructions  Discharge Instructions    Diet - low sodium heart healthy    Complete by:  As directed    Increase activity slowly    Complete by:  As directed      Allergies as of 01/01/2016      Reactions   Orange Juice [orange Oil] Diarrhea   Penicillins Nausea And Vomiting   Tolerates Zosyn. "might have been an overdose" (01/22/2012)   Vicodin [hydrocodone-acetaminophen] Other (See Comments)   "triggered seizure both here and at home when he tried to take it" (01/22/2012)   Oxycodone  Causes Seizures      Medication List    TAKE these medications   acetaminophen 500 MG tablet Commonly known as:  TYLENOL Take 500 mg by mouth every 6 (six) hours as needed for mild pain.   atorvastatin 40 MG tablet Commonly known as:  LIPITOR Take 1 tablet (40 mg total) by mouth daily at 6 PM. What changed:  when to take this   carbamazepine 200 MG tablet Commonly known as:  EPITOL Take 2 tablets (400 mg total) by mouth 2 (two) times daily.   clopidogrel 75 MG tablet Commonly  known as:  PLAVIX Take 1 tablet (75 mg total) by mouth daily.   colchicine 0.6 MG tablet Take 1 tablet (0.6 mg total) by mouth 2 (two) times daily. What changed:  when to take this  reasons to take this   lacosamide 200 MG Tabs tablet Commonly known as:  VIMPAT Take 1 tablet (200 mg total) by mouth 2 (two) times daily.   levETIRAcetam 750 MG tablet Commonly known as:  KEPPRA Take 2 tablets (1,500 mg total) by mouth 2 (two) times daily.   lisinopril 20 MG tablet Commonly known as:  PRINIVIL,ZESTRIL Take 20 mg by mouth daily.   loratadine 10 MG tablet Commonly known as:  CLARITIN Take 10 mg by mouth daily as needed for allergies. Reported on 07/04/2015      Follow-up Information    Lee Courier, MD Follow up.   Specialty:  Family Medicine Contact information: [address] [place] [address] [phone]            The results of significant diagnostics from this hospitalization (including imaging, microbiology, ancillary and laboratory) are listed below for reference.     Microbiology: No results found for this or any previous visit (from the past 240 hour(s)).   Labs: Basic Metabolic Panel:  Recent Labs Lab 12/30/15 1335 12/30/15 1742 12/31/15 0535 01/01/16 0349  NA 132*  --  134* 133*  K 4.2  --  3.8 3.8  CL 100*  --  101 101  CO2 23  --  24 23  GLUCOSE 120*  --  91 88  BUN 12  --  8 10  CREATININE 1.08 1.08 0.78 0.77  CALCIUM 8.8*  --  8.4* 8.6*  MG 1.8  --   --   --    Liver Function Tests:  Recent Labs Lab 12/30/15 1335  AST 27  ALT 22  ALKPHOS 90  BILITOT 0.3  PROT 7.0  ALBUMIN 3.9   No results for input(s): LIPASE, AMYLASE in the last 168 hours. No results for input(s): AMMONIA in the last 168 hours. CBC:  Recent Labs Lab 12/30/15 1335 12/30/15 1742 12/31/15 0535 01/01/16 0349  WBC 13.0* 12.2* 8.2 5.4  NEUTROABS  --   --  4.8  --   HGB 14.7 14.3 13.6 13.5  HCT 42.0 42.2 39.7 39.3  MCV 91.7 92.7 91.7 91.0   PLT 242 260 218 226   Cardiac Enzymes:  Recent Labs Lab 12/30/15 1335 12/30/15 1742 12/30/15 1953 12/30/15 2139  TROPONINI 0.06* 0.04* 0.06* 0.05*    SIGNED: Time coordinating discharge: 30 minutes  MAGICK-Irby Fails, MD  [facility] 01/01/2016, 9:33 AM Pager [phone]  If 7PM-7AM, please contact night-coverage [email or web address] Password [email or web address]

## 2016-01-01 NOTE — Progress Notes (Signed)
I went over and give the discharge instructions to Lee [name] the client wife along with medication list that needs to be taken this afternoon. Follow up appoints are also listed. Telemetry was removed and CCMD notified. Client discharging with his wife via car. Sheppard Evens.  Sakai Wolford RN

## 2016-01-01 NOTE — Progress Notes (Addendum)
SUBJECTIVE: The patient is doing well today.  At this time, he denies chest pain, shortness of breath, or any new concerns.  He states "I feel great".  Wants to go home today.  [name]. aspirin  324 mg Oral Once  . atorvastatin  40 mg Oral Daily  . carbamazepine  400 mg Oral BID  . clopidogrel  75 mg Oral Daily  . enoxaparin (LOVENOX) injection  40 mg Subcutaneous Q24H  . lacosamide  200 mg Oral BID  . levETIRAcetam  1,500 mg Oral BID  . lisinopril  20 mg Oral Daily     OBJECTIVE: Physical Exam: Vitals:   12/31/15 0851 12/31/15 1500 12/31/15 2036 01/01/16 0500  BP: 116/77 111/69 107/69 132/76  Pulse:  73 71 78  Resp:  16 (!) 28 (!) 22  Temp:  99.2 F (37.3 C) 99.8 F (37.7 C) 98.3 F (36.8 C)  TempSrc:      SpO2:  98% 98% 96%  Weight:    153 lb 4.8 oz (69.5 kg)    Intake/Output Summary (Last 24 hours) at 01/01/16 0831 Last data filed at 01/01/16 0500  Gross per 24 hour  Intake              722 ml  Output              975 ml  Net             -253 ml    Telemetry reveals sinus rhythm  GEN- The patient is well appearing, alert and oriented x 3 today.   Head- normocephalic, atraumatic Eyes-  Sclera clear, conjunctiva pink Ears- hearing intact Oropharynx- clear Neck- supple  Lungs- Clear to ausculation bilaterally, normal work of breathing Heart- Regular rate and rhythm, no murmurs, rubs or gallops, PMI not laterally displaced GI- soft, NT, ND, + BS Extremities- no clubbing, cyanosis, or edema Skin- no rash or lesion Psych- euthymic mood, full affect Neuro- strength and sensation are intact  LABS: Basic Metabolic Panel:  Recent Labs  16/10/9610/22/17 1335  12/31/15 0535 01/01/16 0349  NA 132*  --  134* 133*  K 4.2  --  3.8 3.8  CL 100*  --  101 101  CO2 23  --  24 23  GLUCOSE 120*  --  91 88  BUN 12  --  8 10  CREATININE 1.08  < > 0.78 0.77  CALCIUM 8.8*  --  8.4* 8.6*  MG 1.8  --   --   --   < > = values in this interval not displayed. Liver Function  Tests:  Recent Labs  12/30/15 1335  AST 27  ALT 22  ALKPHOS 90  BILITOT 0.3  PROT 7.0  ALBUMIN 3.9   No results for input(s): LIPASE, AMYLASE in the last 72 hours. CBC:  Recent Labs  12/31/15 0535 01/01/16 0349  WBC 8.2 5.4  NEUTROABS 4.8  --   HGB 13.6 13.5  HCT 39.7 39.3  MCV 91.7 91.0  PLT 218 226   Cardiac Enzymes:  Recent Labs  12/30/15 1742 12/30/15 1953 12/30/15 2139  TROPONINI 0.04* 0.06* 0.05*    ASSESSMENT AND PLAN:   1. HTN Stable No changes  2. Atypical chest pain Per Dr Allyson SabalBerry, given atypical presentation and normal echo, will anticipate outpatient stress testing.  No further inpatient workup planned  3. Tobacco Cessation advised  Ok to discharge from cardiology standpoint. Our office will arrange outpatient stress testing.   Hillis RangeJames Dominigue Gellner, MD 01/01/2016 8:31  AM

## 2016-01-01 NOTE — Discharge Instructions (Signed)
Acute Pain, Adult °Acute pain is a type of pain that may last for just a few days or as long as six months. It is often related to an illness, injury, or medical procedure. Acute pain may be mild, moderate, or severe. It usually goes away once your injury has healed or you are no longer ill. °Pain can make it hard for you to do daily activities. It can cause anxiety and lead to other problems if left untreated. Treatment depends on the cause and severity of your acute pain. °Follow these instructions at home: °· Check your pain level as told by your health care provider. °· Take over-the-counter and prescription medicines only as told by your health care provider. °· If you are taking prescription pain medicine: °¨ Ask your health care provider about taking a stool softener or laxative to prevent constipation. °¨ Do not stop taking the medicine suddenly. Talk to your health care provider about how and when to discontinue prescription pain medicine. °¨ If your pain is severe, do not take more pills than instructed by your health care provider. °¨ Do not take other over-the-counter pain medicines in addition to this medicine unless told by your health care provider. °¨ Do not drive or operate heavy machinery while taking prescription pain medicine. °· Apply ice or heat as told by your health care provider. These may reduce swelling and pain. °· Ask your health care provider if other strategies such as distraction, relaxation, or physical therapies can help your pain. °· Keep all follow-up visits as told by your health care provider. This is important. °Contact a health care provider if: °· You have pain that is not controlled by medicine. °· Your pain does not improve or gets worse. °· You have side effects from pain medicines, such as vomiting or confusion. °Get help right away if: °· You have severe pain. °· You have trouble breathing. °· You lose consciousness. °· You have chest pain or pressure that lasts for more  than a few minutes. Along with the chest pain you may: °¨ Have pain or discomfort in one or both arms, your back, neck, jaw, or stomach. °¨ Have shortness of breath. °¨ Break out in a cold sweat. °¨ Feel nauseous. °¨ Become light-headed. °These symptoms may represent a serious problem that is an emergency. Do not wait to see if the symptoms will go away. Get medical help right away. Call your local emergency services (911 in the [place] Do not drive yourself to the hospital.  °This information is not intended to replace advice given to you by your health care provider. Make sure you discuss any questions you have with your health care provider. °Document Released: 01/09/2015 Document Revised: 06/03/2015 Document Reviewed: 01/09/2015 °Elsevier Interactive Patient Education © 2017 Elsevier Inc. ° °

## 2016-01-03 ENCOUNTER — Other Ambulatory Visit: Payer: Self-pay | Admitting: Diagnostic Neuroimaging

## 2016-01-04 ENCOUNTER — Other Ambulatory Visit: Payer: Self-pay | Admitting: Physician Assistant

## 2016-01-04 DIAGNOSIS — I10 Essential (primary) hypertension: Secondary | ICD-10-CM | POA: Diagnosis not present

## 2016-01-04 DIAGNOSIS — J449 Chronic obstructive pulmonary disease, unspecified: Secondary | ICD-10-CM | POA: Diagnosis not present

## 2016-01-04 DIAGNOSIS — K5900 Constipation, unspecified: Secondary | ICD-10-CM | POA: Diagnosis not present

## 2016-01-04 DIAGNOSIS — R079 Chest pain, unspecified: Secondary | ICD-10-CM

## 2016-01-04 NOTE — Progress Notes (Signed)
Order for stress test placed.

## 2016-01-16 ENCOUNTER — Ambulatory Visit: Payer: Self-pay | Admitting: Diagnostic Neuroimaging

## 2016-02-01 ENCOUNTER — Ambulatory Visit (INDEPENDENT_AMBULATORY_CARE_PROVIDER_SITE_OTHER): Payer: Medicare Other | Admitting: Diagnostic Neuroimaging

## 2016-02-01 ENCOUNTER — Encounter: Payer: Self-pay | Admitting: Diagnostic Neuroimaging

## 2016-02-01 VITALS — BP 107/67 | HR 74 | Wt 160.0 lb

## 2016-02-01 DIAGNOSIS — R413 Other amnesia: Secondary | ICD-10-CM

## 2016-02-01 DIAGNOSIS — I6930 Unspecified sequelae of cerebral infarction: Secondary | ICD-10-CM | POA: Diagnosis not present

## 2016-02-01 DIAGNOSIS — F0390 Unspecified dementia without behavioral disturbance: Secondary | ICD-10-CM

## 2016-02-01 DIAGNOSIS — G40109 Localization-related (focal) (partial) symptomatic epilepsy and epileptic syndromes with simple partial seizures, not intractable, without status epilepticus: Secondary | ICD-10-CM

## 2016-02-01 MED ORDER — CARBAMAZEPINE 200 MG PO TABS: 400.0000 mg | ORAL_TABLET | Freq: Two times a day (BID) | ORAL | 3 refills | Status: DC

## 2016-02-01 MED ORDER — LEVETIRACETAM 750 MG PO TABS: 1500.0000 mg | ORAL_TABLET | Freq: Two times a day (BID) | ORAL | 4 refills | Status: DC

## 2016-02-01 MED ORDER — LACOSAMIDE 200 MG PO TABS: 200.0000 mg | ORAL_TABLET | Freq: Two times a day (BID) | ORAL | 5 refills | Status: DC
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
## 2016-05-23 ENCOUNTER — Emergency Department (HOSPITAL_COMMUNITY)
Admission: EM | Admit: 2016-05-23 | Discharge: 2016-05-23 | Disposition: A | Discharge status: Home / Self Care | Payer: Medicare Other | Attending: Emergency Medicine | Admitting: Emergency Medicine

## 2016-05-23 ENCOUNTER — Encounter (HOSPITAL_COMMUNITY): Payer: Self-pay | Admitting: *Deleted

## 2016-05-23 DIAGNOSIS — R402441 Other coma, without documented Glasgow coma scale score, or with partial score reported, in the field [EMT or ambulance]: Secondary | ICD-10-CM | POA: Diagnosis not present

## 2016-05-23 DIAGNOSIS — F0281 Dementia in other diseases classified elsewhere with behavioral disturbance: Secondary | ICD-10-CM | POA: Diagnosis not present

## 2016-05-23 DIAGNOSIS — G309 Alzheimer's disease, unspecified: Secondary | ICD-10-CM | POA: Diagnosis not present

## 2016-05-23 DIAGNOSIS — Z79899 Other long term (current) drug therapy: Secondary | ICD-10-CM | POA: Diagnosis not present

## 2016-05-23 DIAGNOSIS — Z8673 Personal history of transient ischemic attack (TIA), and cerebral infarction without residual deficits: Secondary | ICD-10-CM | POA: Insufficient documentation

## 2016-05-23 DIAGNOSIS — G300 Alzheimer's disease with early onset: Secondary | ICD-10-CM

## 2016-05-23 DIAGNOSIS — Z87891 Personal history of nicotine dependence: Secondary | ICD-10-CM | POA: Insufficient documentation

## 2016-05-23 DIAGNOSIS — J449 Chronic obstructive pulmonary disease, unspecified: Secondary | ICD-10-CM | POA: Insufficient documentation

## 2016-05-23 DIAGNOSIS — I10 Essential (primary) hypertension: Secondary | ICD-10-CM | POA: Diagnosis not present

## 2016-05-23 LAB — CBC WITH DIFFERENTIAL/PLATELET
BASOS ABS: 0 10*3/uL (ref 0.0–0.1)
BASOS PCT: 0 %
EOS ABS: 0.1 10*3/uL (ref 0.0–0.7)
EOS PCT: 2 %
HCT: 40.8 % (ref 39.0–52.0)
Hemoglobin: 14 g/dL (ref 13.0–17.0)
Lymphocytes Relative: 23 %
Lymphs Abs: 1.9 10*3/uL (ref 0.7–4.0)
MCH: 32 pg (ref 26.0–34.0)
MCHC: 34.3 g/dL (ref 30.0–36.0)
MCV: 93.4 fL (ref 78.0–100.0)
Monocytes Absolute: 0.8 10*3/uL (ref 0.1–1.0)
Monocytes Relative: 10 %
NEUTROS PCT: 65 %
Neutro Abs: 5.3 10*3/uL (ref 1.7–7.7)
PLATELETS: 308 10*3/uL (ref 150–400)
RBC: 4.37 MIL/uL (ref 4.22–5.81)
RDW: 13.4 % (ref 11.5–15.5)
WBC: 8.2 10*3/uL (ref 4.0–10.5)

## 2016-05-23 LAB — BASIC METABOLIC PANEL
ANION GAP: 9 (ref 5–15)
BUN: 6 mg/dL (ref 6–20)
CO2: 25 mmol/L (ref 22–32)
Calcium: 8.6 mg/dL — ABNORMAL LOW (ref 8.9–10.3)
Chloride: 102 mmol/L (ref 101–111)
Creatinine, Ser: 0.74 mg/dL (ref 0.61–1.24)
Glucose, Bld: 93 mg/dL (ref 65–99)
Potassium: 3.8 mmol/L (ref 3.5–5.1)
SODIUM: 136 mmol/L (ref 135–145)

## 2016-05-23 NOTE — ED Notes (Signed)
Pt stating he is ready to go home. Reoriented pt, waiting on case management for plan of care. Pt agrees.

## 2016-05-23 NOTE — ED Notes (Signed)
Offered pt urinal, pt unable to void.
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
## 2016-05-23 NOTE — ED Notes (Signed)
Pt sitting up in bed, encourage pt not to get up. Wife at bedside with pt reports she will press call bell if he attempts to get out of bed.

## 2016-05-23 NOTE — ED Notes (Signed)
Case Management at bedside.

## 2016-05-23 NOTE — ED Notes (Signed)
Wanda at bedside speaking with family about possible home health set up.
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
## 2016-05-23 NOTE — ED Notes (Signed)
Pt's family requesting RN to come in to room.

## 2016-05-23 NOTE — Discharge Instructions (Signed)
Help at home arranged by social services and case management. [facility] They may be a help adjust his meds some. Today's workup without any significant findings. Return for any new or worse symptoms.

## 2016-05-23 NOTE — Care Management Note (Signed)
Case Management Note  Patient Details  Name: Lee [name] MRN: [id number] Date of Birth: [birth year]  Subjective/Objective:          Patient brought to [facility] ED for evaluation for mental confusion.          Action/Plan: CM consulted for recommendations for [facility] services. ED CM met with patient and wife at bedside. Discussed the recommendations for [facility] services CM explained [place] services wife reports patient has had [facility] services in the past. He has had [facility] services and would like their services again.  Orders were placed for F2F with [facility], PT/OT/HHA wife is agreeable. Referral faxed to [facility] via [place]. Patient and wife made aware that a nurse from [facility] will contact them 24-48 hours post discharge from the ED. No further ED CM needs identified.  Expected Discharge Date:   05/23/2016               Expected Discharge Plan:  [place]  In-House Referral:  Clinical Social Work  Discharge planning Services  CM Consult  Post Acute Care Choice:    Choice offered to:  Spouse  DME Arranged:    DME Agency:     HH Arranged:  RN, PT, OT, Nurse's Aide, Social Work [employer] Agency:  [place]  Status of Service:  Completed, signed off  If discussed at [employer], dates discussed:    Additional CommentsLaurena Slimmer, RN 05/23/2016, 6:30 PM
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
## 2016-05-23 NOTE — ED Notes (Signed)
ED Provider at bedside. 

## 2016-05-23 NOTE — ED Notes (Signed)
Pt calm at this time. When asked what brought him into the ED today pt reports "I was having minor pains earlier".

## 2016-05-30 DIAGNOSIS — Z6823 Body mass index (BMI) 23.0-23.9, adult: Secondary | ICD-10-CM | POA: Diagnosis not present

## 2016-05-30 DIAGNOSIS — I10 Essential (primary) hypertension: Secondary | ICD-10-CM | POA: Diagnosis not present

## 2016-05-30 DIAGNOSIS — J449 Chronic obstructive pulmonary disease, unspecified: Secondary | ICD-10-CM | POA: Diagnosis not present

## 2016-06-13 ENCOUNTER — Encounter (HOSPITAL_COMMUNITY): Payer: Self-pay

## 2016-06-13 ENCOUNTER — Emergency Department (HOSPITAL_COMMUNITY)
Admission: EM | Admit: 2016-06-13 | Discharge: 2016-06-13 | Disposition: A | Discharge status: Home / Self Care | Payer: Medicare Other | Attending: Emergency Medicine | Admitting: Emergency Medicine

## 2016-06-13 ENCOUNTER — Emergency Department (HOSPITAL_COMMUNITY): Payer: Medicare Other

## 2016-06-13 DIAGNOSIS — F989 Unspecified behavioral and emotional disorders with onset usually occurring in childhood and adolescence: Secondary | ICD-10-CM | POA: Diagnosis not present

## 2016-06-13 DIAGNOSIS — Z87891 Personal history of nicotine dependence: Secondary | ICD-10-CM | POA: Diagnosis not present

## 2016-06-13 DIAGNOSIS — I10 Essential (primary) hypertension: Secondary | ICD-10-CM | POA: Insufficient documentation

## 2016-06-13 DIAGNOSIS — Z79899 Other long term (current) drug therapy: Secondary | ICD-10-CM | POA: Insufficient documentation

## 2016-06-13 DIAGNOSIS — R791 Abnormal coagulation profile: Secondary | ICD-10-CM | POA: Insufficient documentation

## 2016-06-13 DIAGNOSIS — N390 Urinary tract infection, site not specified: Secondary | ICD-10-CM | POA: Diagnosis not present

## 2016-06-13 DIAGNOSIS — J449 Chronic obstructive pulmonary disease, unspecified: Secondary | ICD-10-CM | POA: Diagnosis not present

## 2016-06-13 DIAGNOSIS — R222 Localized swelling, mass and lump, trunk: Secondary | ICD-10-CM | POA: Diagnosis not present

## 2016-06-13 DIAGNOSIS — F0390 Unspecified dementia without behavioral disturbance: Secondary | ICD-10-CM | POA: Insufficient documentation

## 2016-06-13 DIAGNOSIS — Z8673 Personal history of transient ischemic attack (TIA), and cerebral infarction without residual deficits: Secondary | ICD-10-CM | POA: Insufficient documentation

## 2016-06-13 DIAGNOSIS — R4689 Other symptoms and signs involving appearance and behavior: Secondary | ICD-10-CM

## 2016-06-13 DIAGNOSIS — R4182 Altered mental status, unspecified: Secondary | ICD-10-CM | POA: Diagnosis present

## 2016-06-13 LAB — COMPREHENSIVE METABOLIC PANEL
ALT: 17 U/L (ref 17–63)
AST: 19 U/L (ref 15–41)
Albumin: 3.7 g/dL (ref 3.5–5.0)
Alkaline Phosphatase: 99 U/L (ref 38–126)
Anion gap: 7 (ref 5–15)
BILIRUBIN TOTAL: 0.7 mg/dL (ref 0.3–1.2)
BUN: 6 mg/dL (ref 6–20)
CO2: 28 mmol/L (ref 22–32)
CREATININE: 0.93 mg/dL (ref 0.61–1.24)
Calcium: 9.1 mg/dL (ref 8.9–10.3)
Chloride: 102 mmol/L (ref 101–111)
GFR calc Af Amer: >60 mL/min (ref >60)
GFR calc non Af Amer: >60 mL/min (ref >60)
Glucose, Bld: 87 mg/dL (ref 65–99)
Potassium: 4 mmol/L (ref 3.5–5.1)
Sodium: 137 mmol/L (ref 135–145)
TOTAL PROTEIN: 7 g/dL (ref 6.5–8.1)

## 2016-06-13 LAB — URINALYSIS, ROUTINE W REFLEX MICROSCOPIC
Bilirubin Urine: NEGATIVE
Glucose, UA: NEGATIVE mg/dL
KETONES UR: NEGATIVE mg/dL
NITRITE: POSITIVE — AB
PROTEIN: NEGATIVE mg/dL
Specific Gravity, Urine: 1.014 (ref 1.005–1.030)
pH: 7 (ref 5.0–8.0)

## 2016-06-13 LAB — RAPID URINE DRUG SCREEN, HOSP PERFORMED
Amphetamines: NOT DETECTED
BARBITURATES: NOT DETECTED
Benzodiazepines: NOT DETECTED
Cocaine: NOT DETECTED
Opiates: NOT DETECTED
Tetrahydrocannabinol: NOT DETECTED

## 2016-06-13 LAB — CBC
HEMATOCRIT: 44.4 % (ref 39.0–52.0)
HEMOGLOBIN: 15.1 g/dL (ref 13.0–17.0)
MCH: 31.7 pg (ref 26.0–34.0)
MCHC: 34 g/dL (ref 30.0–36.0)
MCV: 93.1 fL (ref 78.0–100.0)
Platelets: 273 10*3/uL (ref 150–400)
RBC: 4.77 MIL/uL (ref 4.22–5.81)
RDW: 13.8 % (ref 11.5–15.5)
WBC: 8 10*3/uL (ref 4.0–10.5)

## 2016-06-13 LAB — DIFFERENTIAL
BASOS ABS: 0 10*3/uL (ref 0.0–0.1)
Basophils Relative: 1 %
EOS ABS: 0.2 10*3/uL (ref 0.0–0.7)
Eosinophils Relative: 3 %
LYMPHS ABS: 3.1 10*3/uL (ref 0.7–4.0)
Lymphocytes Relative: 38 %
MONO ABS: 0.7 10*3/uL (ref 0.1–1.0)
MONOS PCT: 9 %
NEUTROS ABS: 4 10*3/uL (ref 1.7–7.7)
Neutrophils Relative %: 49 %

## 2016-06-13 LAB — APTT: APTT: 32 s (ref 24–36)

## 2016-06-13 LAB — PROTIME-INR
INR: 1.04
Prothrombin Time: 13.6 seconds (ref 11.4–15.2)

## 2016-06-13 LAB — ETHANOL: Alcohol, Ethyl (B): <5 mg/dL (ref <5)

## 2016-06-13 LAB — I-STAT TROPONIN, ED: TROPONIN I, POC: 0.01 ng/mL (ref 0.00–0.08)

## 2016-06-13 MED ORDER — DEXTROSE 5 % IV SOLN
1.0000 g | Freq: Once | INTRAVENOUS | Status: AC
  Administered 2016-06-13: 1 g via INTRAVENOUS
  Filled 2016-06-13: qty 10

## 2016-06-13 MED ORDER — CEPHALEXIN 500 MG PO CAPS: 500.0000 mg | ORAL_CAPSULE | Freq: Two times a day (BID) | ORAL | 0 refills | Status: DC

## 2016-06-13 NOTE — ED Notes (Signed)
Pt attempting to use urinal at this time.

## 2016-06-13 NOTE — ED Notes (Signed)
EDP at bedside  

## 2016-06-13 NOTE — ED Notes (Signed)
Patient transported to X-ray on heart monitor  

## 2016-06-13 NOTE — ED Triage Notes (Signed)
Pt. Here for altered mental status. Pt. On way to adult day care with wife when he stared off and became non-verbal. Pt. Hx of same. Pt. Given medications this morning for seizure, but wife unsure if he swallowed them. Pt. Follows some commands. Pt. Still non-verbal, but reactive to painful stimuli. EDP at bedside.
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
## 2016-06-15 LAB — URINE CULTURE
Culture: >=100000 — AB
Special Requests: NORMAL

## 2016-06-16 ENCOUNTER — Telehealth: Payer: Self-pay

## 2016-06-16 NOTE — Telephone Encounter (Signed)
Post ED Visit - Positive Culture Follow-up  Culture report reviewed by antimicrobial stewardship pharmacist:  []  Lee [name], Pharm.D. []  Lee [name], Pharm.D., BCPS AQ-ID []  Lee [name], Pharm.D., BCPS []  Lee [name], Pharm.D., BCPS []  Lee [name], Vermont[place]arm.D., BCPS, AAHIVP []  Lee [name], Pharm.D., BCPS, AAHIVP []  Lee [name], PharmD, BCPS []  Lee [name], PharmD, BCPS []  Lee [name], PharmD, BCPS [facility] Pharm D Positive urine culture Treated with Cephalexin, organism sensitive to the same and no further patient follow-up is required at this time.  Lee [name], Lee [name] 06/16/2016, 10:49 AM

## 2016-06-20 DIAGNOSIS — N390 Urinary tract infection, site not specified: Secondary | ICD-10-CM | POA: Diagnosis not present

## 2016-06-21 ENCOUNTER — Telehealth: Payer: Self-pay | Admitting: Diagnostic Neuroimaging

## 2016-06-21 NOTE — Telephone Encounter (Signed)
Noted  

## 2016-06-21 NOTE — Telephone Encounter (Signed)
Pt thinks he is in his childhood, thinks his mother is alive, he is wandering off during the middle of night. She said he is recovering from UTI.  Pt's wife said she is very stressed and has been told her BP is high and if she continues with the high stress it could be at stroke level. She is wanting an assessment to determine if he should stay at home or be placed in a facility, his family wants him placed in a facility. [facility]

## 2016-06-25 ENCOUNTER — Ambulatory Visit (INDEPENDENT_AMBULATORY_CARE_PROVIDER_SITE_OTHER): Payer: Medicare Other | Admitting: Adult Health

## 2016-06-25 ENCOUNTER — Encounter: Payer: Self-pay | Admitting: Adult Health

## 2016-06-25 ENCOUNTER — Encounter (INDEPENDENT_AMBULATORY_CARE_PROVIDER_SITE_OTHER): Payer: Self-pay

## 2016-06-25 VITALS — Wt 153.8 lb

## 2016-06-25 DIAGNOSIS — R413 Other amnesia: Secondary | ICD-10-CM | POA: Diagnosis not present

## 2016-06-25 DIAGNOSIS — R569 Unspecified convulsions: Secondary | ICD-10-CM | POA: Diagnosis not present

## 2016-06-25 DIAGNOSIS — Z87440 Personal history of urinary (tract) infections: Secondary | ICD-10-CM

## 2016-06-25 NOTE — Progress Notes (Signed)
I have read the note, and I agree with the clinical assessment and plan.  Aarnav Steagall KEITH   

## 2016-06-25 NOTE — Patient Instructions (Signed)
Continue Vimpat, Keppra, and carbamazepine Use Pill box- observe patient taking medication Memory score 16/30 Will f/u in 6 months if memory score lower then will consider medication (Namenda) Please call if he has any additional seizure events If your symptoms worsen or you develop new symptoms please let us know.
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
## 2016-07-13 ENCOUNTER — Other Ambulatory Visit: Payer: Self-pay | Admitting: Diagnostic Neuroimaging

## 2016-07-25 ENCOUNTER — Emergency Department (HOSPITAL_COMMUNITY)
Admission: EM | Admit: 2016-07-25 | Discharge: 2016-07-26 | Disposition: A | Discharge status: Other Acute Inpatient Hospital | Payer: Medicare Other | Attending: Emergency Medicine | Admitting: Emergency Medicine

## 2016-07-25 ENCOUNTER — Encounter (HOSPITAL_COMMUNITY): Payer: Self-pay | Admitting: Emergency Medicine

## 2016-07-25 ENCOUNTER — Emergency Department (HOSPITAL_COMMUNITY): Payer: Medicare Other

## 2016-07-25 DIAGNOSIS — J449 Chronic obstructive pulmonary disease, unspecified: Secondary | ICD-10-CM | POA: Diagnosis not present

## 2016-07-25 DIAGNOSIS — F0390 Unspecified dementia without behavioral disturbance: Secondary | ICD-10-CM | POA: Diagnosis present

## 2016-07-25 DIAGNOSIS — Z79899 Other long term (current) drug therapy: Secondary | ICD-10-CM | POA: Diagnosis not present

## 2016-07-25 DIAGNOSIS — Z87891 Personal history of nicotine dependence: Secondary | ICD-10-CM | POA: Insufficient documentation

## 2016-07-25 DIAGNOSIS — F0391 Unspecified dementia with behavioral disturbance: Secondary | ICD-10-CM | POA: Insufficient documentation

## 2016-07-25 DIAGNOSIS — F03918 Unspecified dementia, unspecified severity, with other behavioral disturbance: Secondary | ICD-10-CM

## 2016-07-25 DIAGNOSIS — Z7902 Long term (current) use of antithrombotics/antiplatelets: Secondary | ICD-10-CM | POA: Insufficient documentation

## 2016-07-25 DIAGNOSIS — R4182 Altered mental status, unspecified: Secondary | ICD-10-CM | POA: Diagnosis present

## 2016-07-25 DIAGNOSIS — R9431 Abnormal electrocardiogram [ECG] [EKG]: Secondary | ICD-10-CM | POA: Diagnosis not present

## 2016-07-25 DIAGNOSIS — R410 Disorientation, unspecified: Secondary | ICD-10-CM | POA: Diagnosis not present

## 2016-07-25 DIAGNOSIS — I10 Essential (primary) hypertension: Secondary | ICD-10-CM | POA: Diagnosis not present

## 2016-07-25 DIAGNOSIS — R4689 Other symptoms and signs involving appearance and behavior: Secondary | ICD-10-CM | POA: Diagnosis present

## 2016-07-25 LAB — RAPID URINE DRUG SCREEN, HOSP PERFORMED
AMPHETAMINES: NOT DETECTED
BARBITURATES: NOT DETECTED
BENZODIAZEPINES: NOT DETECTED
Cocaine: NOT DETECTED
Opiates: NOT DETECTED
Tetrahydrocannabinol: NOT DETECTED

## 2016-07-25 LAB — URINALYSIS, ROUTINE W REFLEX MICROSCOPIC
Bilirubin Urine: NEGATIVE
GLUCOSE, UA: NEGATIVE mg/dL
KETONES UR: NEGATIVE mg/dL
Nitrite: POSITIVE — AB
PH: 6 (ref 5.0–8.0)
Protein, ur: NEGATIVE mg/dL
Specific Gravity, Urine: 1.013 (ref 1.005–1.030)

## 2016-07-25 LAB — ETHANOL: Alcohol, Ethyl (B): <5 mg/dL (ref <5)

## 2016-07-25 LAB — COMPREHENSIVE METABOLIC PANEL
ALBUMIN: 3.9 g/dL (ref 3.5–5.0)
ALT: 16 U/L — AB (ref 17–63)
AST: 21 U/L (ref 15–41)
Alkaline Phosphatase: 98 U/L (ref 38–126)
Anion gap: 10 (ref 5–15)
BUN: 13 mg/dL (ref 6–20)
CHLORIDE: 101 mmol/L (ref 101–111)
CO2: 24 mmol/L (ref 22–32)
Calcium: 9 mg/dL (ref 8.9–10.3)
Creatinine, Ser: 0.87 mg/dL (ref 0.61–1.24)
GFR calc Af Amer: >60 mL/min (ref >60)
GFR calc non Af Amer: >60 mL/min (ref >60)
GLUCOSE: 103 mg/dL — AB (ref 65–99)
POTASSIUM: 3.5 mmol/L (ref 3.5–5.1)
Sodium: 135 mmol/L (ref 135–145)
Total Bilirubin: 0.6 mg/dL (ref 0.3–1.2)
Total Protein: 7.3 g/dL (ref 6.5–8.1)

## 2016-07-25 LAB — CBC WITH DIFFERENTIAL/PLATELET
BASOS ABS: 0 10*3/uL (ref 0.0–0.1)
Basophils Relative: 0 %
EOS ABS: 0.2 10*3/uL (ref 0.0–0.7)
EOS PCT: 2 %
HCT: 41.9 % (ref 39.0–52.0)
Hemoglobin: 14.8 g/dL (ref 13.0–17.0)
Lymphocytes Relative: 24 %
Lymphs Abs: 1.7 10*3/uL (ref 0.7–4.0)
MCH: 32 pg (ref 26.0–34.0)
MCHC: 35.3 g/dL (ref 30.0–36.0)
MCV: 90.7 fL (ref 78.0–100.0)
Monocytes Absolute: 0.8 10*3/uL (ref 0.1–1.0)
Monocytes Relative: 10 %
Neutro Abs: 4.7 10*3/uL (ref 1.7–7.7)
Neutrophils Relative %: 64 %
PLATELETS: 237 10*3/uL (ref 150–400)
RBC: 4.62 MIL/uL (ref 4.22–5.81)
RDW: 13.8 % (ref 11.5–15.5)
WBC: 7.4 10*3/uL (ref 4.0–10.5)

## 2016-07-25 MED ORDER — LACOSAMIDE 50 MG PO TABS
200.0000 mg | ORAL_TABLET | Freq: Two times a day (BID) | ORAL | Status: DC
  Administered 2016-07-25 – 2016-07-26 (×2): 200 mg via ORAL
  Filled 2016-07-25 (×2): qty 4

## 2016-07-25 MED ORDER — CARBAMAZEPINE 200 MG PO TABS
400.0000 mg | ORAL_TABLET | Freq: Two times a day (BID) | ORAL | Status: DC
  Administered 2016-07-25 – 2016-07-26 (×2): 400 mg via ORAL
  Filled 2016-07-25 (×2): qty 2

## 2016-07-25 MED ORDER — LISINOPRIL 20 MG PO TABS
20.0000 mg | ORAL_TABLET | Freq: Every day | ORAL | Status: DC
  Administered 2016-07-25 – 2016-07-26 (×2): 20 mg via ORAL
  Filled 2016-07-25 (×2): qty 1

## 2016-07-25 MED ORDER — LEVETIRACETAM 500 MG PO TABS
1500.0000 mg | ORAL_TABLET | Freq: Two times a day (BID) | ORAL | Status: DC
  Administered 2016-07-25 – 2016-07-26 (×2): 1500 mg via ORAL
  Filled 2016-07-25 (×2): qty 3

## 2016-07-25 MED ORDER — ATORVASTATIN CALCIUM 40 MG PO TABS
40.0000 mg | ORAL_TABLET | Freq: Every day | ORAL | Status: DC
  Administered 2016-07-25: 40 mg via ORAL
  Filled 2016-07-25: qty 1

## 2016-07-25 MED ORDER — CEPHALEXIN 500 MG PO CAPS
500.0000 mg | ORAL_CAPSULE | Freq: Two times a day (BID) | ORAL | Status: DC
  Administered 2016-07-25 – 2016-07-26 (×2): 500 mg via ORAL
  Filled 2016-07-25 (×2): qty 1

## 2016-07-25 MED ORDER — ZOLPIDEM TARTRATE 5 MG PO TABS
5.0000 mg | ORAL_TABLET | Freq: Every evening | ORAL | Status: DC | PRN
  Administered 2016-07-25: 5 mg via ORAL
  Filled 2016-07-25: qty 1

## 2016-07-25 MED ORDER — ONDANSETRON HCL 4 MG PO TABS: 4.0000 mg | ORAL_TABLET | Freq: Three times a day (TID) | ORAL | Status: DC | PRN

## 2016-07-25 NOTE — ED Triage Notes (Signed)
Patient [facility] but when asked why patient was unable to tell nurse a reason.  Patient denies pain, SI, HI, or VAH.  Patient calm and cooperative.

## 2016-07-25 NOTE — Progress Notes (Signed)
CSW received call from disposition.  Pt has been [facility] and pt will need to be IVC'd and faxed EKG and IVC papework [facility] at:  Ph: [phone] Fax: [phone]  Disposition stated EDP is aware. CSW will complete IVC paperwork and present to EDP for perusal.  CSW will continue to follow for D/C needs  Lee PeaJonathan F. Bennet [name], Lee SorLCSWA, LCAS, CSI Clinical Social Worker Ph: [phone]

## 2016-07-25 NOTE — BH Assessment (Signed)
[facility] Assessment Progress Note  Per [name], DNP, this pt requires psychiatric hospitalization at this time.  The following facilities have been contacted to seek placement for this pt, with results as noted:  Beds available, information sent, decision pending:  [facility]   At capacity:  [facility]   Doylene Canninghomas Avree Szczygiel, [place] Triage Specialist [phone]

## 2016-07-25 NOTE — Progress Notes (Signed)
CSW received a call from [facility] stating they cannot verify pt's Medicare is valid.  CSW informed disposition.  Please reconsult if future social work needs arise.    Dorothe PeaJonathan F. Avilyn Virtue, Francesco SorLCSWA, LCAS, CSI Clinical Social Worker Ph: [phone]

## 2016-07-25 NOTE — ED Notes (Signed)
Bed: WA31 Expected date:  Expected time:  Means of arrival:  Comments: 

## 2016-07-25 NOTE — ED Notes (Signed)
Patient attempting to walk out of unit.  MD notified to get patient IVC'd.

## 2016-07-25 NOTE — ED Notes (Signed)
Patient aware that urine sample is needed.  Urine cup at bedside.

## 2016-07-25 NOTE — ED Notes (Signed)
Pt stated "I've got insulation in my body.  [facility] then [facility].  I know you're trying to kill me with those pills.  How many people are trying to die behind me?"
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
## 2016-07-25 NOTE — BH Assessment (Signed)
Patient is poor historian and limited information was obtained. The TTS assessment was completed from ED notes and patient's noted history. Writer attempted to contact patient's spouse for collateral and baseline information.  Margrett Rudam Ohern (spouse) [phone]. Writer left a Engineer, technical salesvoicemail.
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
## 2016-07-25 NOTE — Progress Notes (Addendum)
CSW faxed IVC to [facility] and verified IVC paperwork had been received and processed.  CSW then [facility]ed [facility] and pt's IVC paperwork.  Please reconsult if future social work needs arise.   8:55 PM CSW called [facility]'s admitting office and left HIPPA-compliant VM asking if anything in addition is needed [facility].   CSW awaiting return call from [facility].  CSW placed pt's IVC paperwork on disposition's desk.   Dorothe PeaJonathan F. Winslow Ederer, Francesco SorLCSWA, LCAS, CSI Clinical Social Worker Ph: [phone]

## 2016-07-25 NOTE — BH Assessment (Signed)
[facility] Assessment Progress Note  At 16:17 Lee [name] calls [facility].  They tentatively agree to accept pt, provided that they receive EKG results, and pt is placed under IVC.  This Clinical research associatewriter has spoken to RadioShackJonathan Riffey, LCSW, who agrees to follow through with this.  I have provided him with Grace's phone number [phone] and fax number [phone]).  Lee Canninghomas [name], MA Triage Specialist [phone]

## 2016-07-26 DIAGNOSIS — R4689 Other symptoms and signs involving appearance and behavior: Secondary | ICD-10-CM | POA: Diagnosis present

## 2016-07-26 DIAGNOSIS — F0391 Unspecified dementia with behavioral disturbance: Secondary | ICD-10-CM | POA: Diagnosis present

## 2016-07-26 DIAGNOSIS — F0390 Unspecified dementia without behavioral disturbance: Secondary | ICD-10-CM | POA: Diagnosis present

## 2016-07-26 NOTE — ED Notes (Signed)
Pt has cane behind RN desk.

## 2016-07-26 NOTE — BH Assessment (Signed)
[facility] Assessment Progress Note  Per [name], DNP, this pt requires psychiatric hospitalization at this time.  Pt presents under IVC initiated by EDP Derwood KaplanAnkit Nanavati, MD.  At 08:46 Melissa calls from [facility] to report that pt has been accepted to their facility by Dr Joseph ArtSubedi.  Thedore MinsMojeed Akintayo, MD concurs with this decision.  Pt's nurse has been notified, and agrees to call report to [phone].  [facility]  Doylene Canninghomas Taquana Bartley, MA Triage Specialist [phone]

## 2016-07-26 NOTE — Progress Notes (Signed)
Received a call from FennimoreJennifer with [facility] requesting an updated EKG for the pt. TTS to fax EKG to [phone] for review.  Princess BruinsAquicha Mattison Stuckey, MSW, LCSWA TTS Specialist [phone]

## 2016-07-27 DIAGNOSIS — J449 Chronic obstructive pulmonary disease, unspecified: Secondary | ICD-10-CM | POA: Diagnosis not present

## 2016-07-27 DIAGNOSIS — M1990 Unspecified osteoarthritis, unspecified site: Secondary | ICD-10-CM | POA: Diagnosis not present

## 2016-07-29 DIAGNOSIS — E785 Hyperlipidemia, unspecified: Secondary | ICD-10-CM | POA: Diagnosis not present

## 2016-07-29 DIAGNOSIS — Z8673 Personal history of transient ischemic attack (TIA), and cerebral infarction without residual deficits: Secondary | ICD-10-CM | POA: Diagnosis not present

## 2016-07-29 DIAGNOSIS — J449 Chronic obstructive pulmonary disease, unspecified: Secondary | ICD-10-CM | POA: Diagnosis not present

## 2016-07-29 DIAGNOSIS — M109 Gout, unspecified: Secondary | ICD-10-CM | POA: Diagnosis not present

## 2016-07-29 DIAGNOSIS — I10 Essential (primary) hypertension: Secondary | ICD-10-CM | POA: Diagnosis not present

## 2016-08-01 DIAGNOSIS — R569 Unspecified convulsions: Secondary | ICD-10-CM | POA: Diagnosis not present

## 2016-08-01 DIAGNOSIS — J449 Chronic obstructive pulmonary disease, unspecified: Secondary | ICD-10-CM | POA: Diagnosis not present

## 2016-08-01 DIAGNOSIS — N390 Urinary tract infection, site not specified: Secondary | ICD-10-CM | POA: Diagnosis not present

## 2016-08-03 DIAGNOSIS — Z8673 Personal history of transient ischemic attack (TIA), and cerebral infarction without residual deficits: Secondary | ICD-10-CM | POA: Diagnosis not present

## 2016-08-03 DIAGNOSIS — M109 Gout, unspecified: Secondary | ICD-10-CM | POA: Diagnosis not present

## 2016-08-03 DIAGNOSIS — M1990 Unspecified osteoarthritis, unspecified site: Secondary | ICD-10-CM | POA: Diagnosis not present

## 2016-08-03 DIAGNOSIS — J449 Chronic obstructive pulmonary disease, unspecified: Secondary | ICD-10-CM | POA: Diagnosis not present

## 2016-08-08 DIAGNOSIS — Z6822 Body mass index (BMI) 22.0-22.9, adult: Secondary | ICD-10-CM | POA: Diagnosis not present

## 2016-08-08 DIAGNOSIS — J449 Chronic obstructive pulmonary disease, unspecified: Secondary | ICD-10-CM | POA: Diagnosis not present

## 2016-08-14 ENCOUNTER — Emergency Department (HOSPITAL_COMMUNITY): Payer: Medicare Other

## 2016-08-14 ENCOUNTER — Encounter (HOSPITAL_COMMUNITY): Payer: Self-pay | Admitting: Emergency Medicine

## 2016-08-14 ENCOUNTER — Inpatient Hospital Stay (HOSPITAL_COMMUNITY)
Admission: EM | Admit: 2016-08-14 | Discharge: 2016-08-21 | DRG: 872 | Disposition: A | Discharge status: Skilled Nursing Facility | Payer: Medicare Other | Attending: Internal Medicine | Admitting: Internal Medicine

## 2016-08-14 DIAGNOSIS — Z833 Family history of diabetes mellitus: Secondary | ICD-10-CM | POA: Diagnosis not present

## 2016-08-14 DIAGNOSIS — R29898 Other symptoms and signs involving the musculoskeletal system: Secondary | ICD-10-CM

## 2016-08-14 DIAGNOSIS — S0993XA Unspecified injury of face, initial encounter: Secondary | ICD-10-CM | POA: Diagnosis not present

## 2016-08-14 DIAGNOSIS — F0390 Unspecified dementia without behavioral disturbance: Secondary | ICD-10-CM | POA: Diagnosis present

## 2016-08-14 DIAGNOSIS — Z8673 Personal history of transient ischemic attack (TIA), and cerebral infarction without residual deficits: Secondary | ICD-10-CM

## 2016-08-14 DIAGNOSIS — Z23 Encounter for immunization: Secondary | ICD-10-CM

## 2016-08-14 DIAGNOSIS — S01511A Laceration without foreign body of lip, initial encounter: Secondary | ICD-10-CM | POA: Diagnosis not present

## 2016-08-14 DIAGNOSIS — Z87440 Personal history of urinary (tract) infections: Secondary | ICD-10-CM | POA: Diagnosis not present

## 2016-08-14 DIAGNOSIS — Z87891 Personal history of nicotine dependence: Secondary | ICD-10-CM

## 2016-08-14 DIAGNOSIS — G40909 Epilepsy, unspecified, not intractable, without status epilepticus: Secondary | ICD-10-CM | POA: Diagnosis present

## 2016-08-14 DIAGNOSIS — M79604 Pain in right leg: Secondary | ICD-10-CM | POA: Diagnosis not present

## 2016-08-14 DIAGNOSIS — A419 Sepsis, unspecified organism: Secondary | ICD-10-CM | POA: Diagnosis not present

## 2016-08-14 DIAGNOSIS — Y9302 Activity, running: Secondary | ICD-10-CM | POA: Diagnosis present

## 2016-08-14 DIAGNOSIS — I739 Peripheral vascular disease, unspecified: Secondary | ICD-10-CM | POA: Diagnosis not present

## 2016-08-14 DIAGNOSIS — R569 Unspecified convulsions: Secondary | ICD-10-CM

## 2016-08-14 DIAGNOSIS — S199XXA Unspecified injury of neck, initial encounter: Secondary | ICD-10-CM | POA: Diagnosis not present

## 2016-08-14 DIAGNOSIS — Z823 Family history of stroke: Secondary | ICD-10-CM | POA: Diagnosis not present

## 2016-08-14 DIAGNOSIS — M79605 Pain in left leg: Secondary | ICD-10-CM | POA: Diagnosis present

## 2016-08-14 DIAGNOSIS — J449 Chronic obstructive pulmonary disease, unspecified: Secondary | ICD-10-CM | POA: Diagnosis present

## 2016-08-14 DIAGNOSIS — R531 Weakness: Secondary | ICD-10-CM | POA: Diagnosis present

## 2016-08-14 DIAGNOSIS — Z8249 Family history of ischemic heart disease and other diseases of the circulatory system: Secondary | ICD-10-CM | POA: Diagnosis not present

## 2016-08-14 DIAGNOSIS — Z7982 Long term (current) use of aspirin: Secondary | ICD-10-CM

## 2016-08-14 DIAGNOSIS — Z7902 Long term (current) use of antithrombotics/antiplatelets: Secondary | ICD-10-CM

## 2016-08-14 DIAGNOSIS — F0391 Unspecified dementia with behavioral disturbance: Secondary | ICD-10-CM | POA: Diagnosis not present

## 2016-08-14 DIAGNOSIS — T148XXA Other injury of unspecified body region, initial encounter: Secondary | ICD-10-CM | POA: Diagnosis not present

## 2016-08-14 DIAGNOSIS — F0151 Vascular dementia with behavioral disturbance: Secondary | ICD-10-CM | POA: Diagnosis present

## 2016-08-14 DIAGNOSIS — N390 Urinary tract infection, site not specified: Secondary | ICD-10-CM | POA: Diagnosis not present

## 2016-08-14 DIAGNOSIS — F1027 Alcohol dependence with alcohol-induced persisting dementia: Secondary | ICD-10-CM | POA: Diagnosis not present

## 2016-08-14 DIAGNOSIS — W010XXA Fall on same level from slipping, tripping and stumbling without subsequent striking against object, initial encounter: Secondary | ICD-10-CM | POA: Diagnosis present

## 2016-08-14 DIAGNOSIS — R52 Pain, unspecified: Secondary | ICD-10-CM

## 2016-08-14 DIAGNOSIS — R410 Disorientation, unspecified: Secondary | ICD-10-CM | POA: Diagnosis not present

## 2016-08-14 DIAGNOSIS — S0990XA Unspecified injury of head, initial encounter: Secondary | ICD-10-CM | POA: Diagnosis not present

## 2016-08-14 DIAGNOSIS — I10 Essential (primary) hypertension: Secondary | ICD-10-CM | POA: Diagnosis not present

## 2016-08-14 DIAGNOSIS — Y92480 Sidewalk as the place of occurrence of the external cause: Secondary | ICD-10-CM

## 2016-08-14 DIAGNOSIS — M609 Myositis, unspecified: Secondary | ICD-10-CM | POA: Diagnosis present

## 2016-08-14 HISTORY — DX: Unspecified dementia, unspecified severity, without behavioral disturbance, psychotic disturbance, mood disturbance, and anxiety: F03.90

## 2016-08-14 LAB — CBC WITH DIFFERENTIAL/PLATELET
BASOS ABS: 0 10*3/uL (ref 0.0–0.1)
BASOS PCT: 0 %
EOS ABS: 0 10*3/uL (ref 0.0–0.7)
Eosinophils Relative: 0 %
HCT: 39.9 % (ref 39.0–52.0)
HEMOGLOBIN: 14 g/dL (ref 13.0–17.0)
Lymphocytes Relative: 7 %
Lymphs Abs: 1.5 10*3/uL (ref 0.7–4.0)
MCH: 32.6 pg (ref 26.0–34.0)
MCHC: 35.1 g/dL (ref 30.0–36.0)
MCV: 93 fL (ref 78.0–100.0)
Monocytes Absolute: 1 10*3/uL (ref 0.1–1.0)
Monocytes Relative: 5 %
NEUTROS PCT: 88 %
Neutro Abs: 18.8 10*3/uL — ABNORMAL HIGH (ref 1.7–7.7)
Platelets: 366 10*3/uL (ref 150–400)
RBC: 4.29 MIL/uL (ref 4.22–5.81)
RDW: 14.9 % (ref 11.5–15.5)
WBC: 21.3 10*3/uL — AB (ref 4.0–10.5)

## 2016-08-14 LAB — COMPREHENSIVE METABOLIC PANEL
ALK PHOS: 99 U/L (ref 38–126)
ALT: 17 U/L (ref 17–63)
AST: 30 U/L (ref 15–41)
Albumin: 3.8 g/dL (ref 3.5–5.0)
Anion gap: 9 (ref 5–15)
BUN: 15 mg/dL (ref 6–20)
CALCIUM: 9 mg/dL (ref 8.9–10.3)
CO2: 27 mmol/L (ref 22–32)
CREATININE: 1.18 mg/dL (ref 0.61–1.24)
Chloride: 101 mmol/L (ref 101–111)
Glucose, Bld: 92 mg/dL (ref 65–99)
Potassium: 4.8 mmol/L (ref 3.5–5.1)
Sodium: 137 mmol/L (ref 135–145)
TOTAL PROTEIN: 7.8 g/dL (ref 6.5–8.1)
Total Bilirubin: 0.8 mg/dL (ref 0.3–1.2)

## 2016-08-14 LAB — URINALYSIS, ROUTINE W REFLEX MICROSCOPIC
Bacteria, UA: NONE SEEN
Bilirubin Urine: NEGATIVE
Glucose, UA: NEGATIVE mg/dL
Ketones, ur: NEGATIVE mg/dL
Nitrite: NEGATIVE
Protein, ur: NEGATIVE mg/dL
Specific Gravity, Urine: 1.027 (ref 1.005–1.030)
pH: 5 (ref 5.0–8.0)

## 2016-08-14 LAB — I-STAT CG4 LACTIC ACID, ED
LACTIC ACID, VENOUS: 1.33 mmol/L (ref 0.5–1.9)
Lactic Acid, Venous: 2.37 mmol/L (ref 0.5–1.9)

## 2016-08-14 MED ORDER — LIDOCAINE-EPINEPHRINE (PF) 2 %-1:200000 IJ SOLN
20.0000 mL | Freq: Once | INTRAMUSCULAR | Status: AC
  Administered 2016-08-14: 20 mL

## 2016-08-14 MED ORDER — LIDOCAINE HCL 2 % EX GEL
CUTANEOUS | Status: AC
  Filled 2016-08-14: qty 11

## 2016-08-14 MED ORDER — VANCOMYCIN HCL IN DEXTROSE 1-5 GM/200ML-% IV SOLN
1000.0000 mg | INTRAVENOUS | Status: AC
  Administered 2016-08-14: 1000 mg via INTRAVENOUS
  Filled 2016-08-14: qty 200

## 2016-08-14 MED ORDER — SODIUM CHLORIDE 0.9 % IV BOLUS (SEPSIS)
1000.0000 mL | Freq: Once | INTRAVENOUS | Status: AC
Start: 2016-08-14 — End: 2016-08-14
  Administered 2016-08-14: 1000 mL via INTRAVENOUS

## 2016-08-14 MED ORDER — PIPERACILLIN-TAZOBACTAM 3.375 G IVPB 30 MIN
3.3750 g | INTRAVENOUS | Status: AC
  Administered 2016-08-14: 3.375 g via INTRAVENOUS
  Filled 2016-08-14: qty 50

## 2016-08-14 MED ORDER — TETANUS-DIPHTH-ACELL PERTUSSIS 5-2.5-18.5 LF-MCG/0.5 IM SUSP
0.5000 mL | Freq: Once | INTRAMUSCULAR | Status: AC
  Administered 2016-08-14: 0.5 mL via INTRAMUSCULAR
  Filled 2016-08-14: qty 0.5

## 2016-08-14 MED ORDER — LIDOCAINE-EPINEPHRINE (PF) 2 %-1:200000 IJ SOLN
INTRAMUSCULAR | Status: AC
  Administered 2016-08-14: 20 mL
  Filled 2016-08-14: qty 20

## 2016-08-14 MED ORDER — ACETAMINOPHEN 500 MG PO TABS
1000.0000 mg | ORAL_TABLET | Freq: Once | ORAL | Status: AC
  Administered 2016-08-14: 1000 mg via ORAL
  Filled 2016-08-14: qty 2

## 2016-08-14 NOTE — ED Notes (Signed)
Patient stuck multiple times without any success at getting more blood for 2nd blood culture.

## 2016-08-14 NOTE — ED Notes (Signed)
Pt's wife, Octavio Mannsamela Youtsey, is leaving and would like to be notified when Pt's is going to be discharged or admitted.   Rinaldo Cloudamela Brandvold:\ (Home)# [phone] (Cell)#[phone]

## 2016-08-14 NOTE — ED Notes (Signed)
[facility] Expected date:  Expected time:  Means of arrival:  Comments: EMS 66 y/o fall, dementia

## 2016-08-14 NOTE — Progress Notes (Signed)
A consult was received from an ED physician for vancomycin and zosyn per pharmacy dosing.  The patient's profile has been reviewed for ht/wt/allergies/indication/available labs.   One time orders have been placed for Zosyn 3.375g and Vancomycin 1g.  Further antibiotics/pharmacy consults should be ordered by admitting physician if indicated.                       Thank you,  Clance BollAmanda Quenten Nawaz, PharmD, BCPS Pager: [phone] 08/14/2016 6:59 PM

## 2016-08-14 NOTE — ED Triage Notes (Signed)
Per [facility] patient from home for tripping and falling when trying to run from caregiver down the sidewalk after getting out of the house while she was in restroom. Patient has dementia, alert and oriented x2.    Per EMS patient has abrasion to nose, laceration to lip--guaze taped on currently, and an abrasion to left hand. Patient had no LOC when he fell nor taking any blood thinners.  Per EMS when patient fell his dentures shattered.

## 2016-08-14 NOTE — ED Notes (Signed)
Pt tried to provide a urine sample but was unsuccessful. Told Pt to notify us when sample is ready.
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
## 2016-08-14 NOTE — ED Notes (Signed)
Patient transported to X-ray 

## 2016-08-14 NOTE — ED Notes (Signed)
ED Provider at bedside. 

## 2016-08-15 ENCOUNTER — Encounter (HOSPITAL_COMMUNITY): Payer: Self-pay | Admitting: Internal Medicine

## 2016-08-15 DIAGNOSIS — F0391 Unspecified dementia with behavioral disturbance: Secondary | ICD-10-CM

## 2016-08-15 DIAGNOSIS — N390 Urinary tract infection, site not specified: Secondary | ICD-10-CM | POA: Diagnosis present

## 2016-08-15 DIAGNOSIS — R569 Unspecified convulsions: Secondary | ICD-10-CM | POA: Diagnosis not present

## 2016-08-15 DIAGNOSIS — S01511A Laceration without foreign body of lip, initial encounter: Secondary | ICD-10-CM | POA: Diagnosis not present

## 2016-08-15 DIAGNOSIS — I10 Essential (primary) hypertension: Secondary | ICD-10-CM | POA: Diagnosis not present

## 2016-08-15 DIAGNOSIS — R29898 Other symptoms and signs involving the musculoskeletal system: Secondary | ICD-10-CM | POA: Diagnosis not present

## 2016-08-15 DIAGNOSIS — R52 Pain, unspecified: Secondary | ICD-10-CM | POA: Diagnosis not present

## 2016-08-15 LAB — CARBAMAZEPINE LEVEL, TOTAL: Carbamazepine Lvl: 10.2 ug/mL (ref 4.0–12.0)

## 2016-08-15 LAB — CBC WITH DIFFERENTIAL/PLATELET
BASOS ABS: 0 10*3/uL (ref 0.0–0.1)
Basophils Relative: 0 %
EOS PCT: 1 %
Eosinophils Absolute: 0.1 10*3/uL (ref 0.0–0.7)
HCT: 35.7 % — ABNORMAL LOW (ref 39.0–52.0)
Hemoglobin: 12.1 g/dL — ABNORMAL LOW (ref 13.0–17.0)
LYMPHS PCT: 19 %
Lymphs Abs: 2.9 10*3/uL (ref 0.7–4.0)
MCH: 31.6 pg (ref 26.0–34.0)
MCHC: 33.9 g/dL (ref 30.0–36.0)
MCV: 93.2 fL (ref 78.0–100.0)
MONO ABS: 1.1 10*3/uL — AB (ref 0.1–1.0)
MONOS PCT: 7 %
Neutro Abs: 11.2 10*3/uL — ABNORMAL HIGH (ref 1.7–7.7)
Neutrophils Relative %: 73 %
PLATELETS: 294 10*3/uL (ref 150–400)
RBC: 3.83 MIL/uL — ABNORMAL LOW (ref 4.22–5.81)
RDW: 15.1 % (ref 11.5–15.5)
WBC: 15.3 10*3/uL — ABNORMAL HIGH (ref 4.0–10.5)

## 2016-08-15 LAB — BASIC METABOLIC PANEL
Anion gap: 7 (ref 5–15)
BUN: 10 mg/dL (ref 6–20)
CO2: 25 mmol/L (ref 22–32)
Calcium: 8.2 mg/dL — ABNORMAL LOW (ref 8.9–10.3)
Chloride: 105 mmol/L (ref 101–111)
Creatinine, Ser: 0.79 mg/dL (ref 0.61–1.24)
GLUCOSE: 92 mg/dL (ref 65–99)
POTASSIUM: 3.7 mmol/L (ref 3.5–5.1)
SODIUM: 137 mmol/L (ref 135–145)

## 2016-08-15 LAB — LACTIC ACID, PLASMA
LACTIC ACID, VENOUS: 1.5 mmol/L (ref 0.5–1.9)
Lactic Acid, Venous: 1.1 mmol/L (ref 0.5–1.9)

## 2016-08-15 LAB — APTT: aPTT: 34 seconds (ref 24–36)

## 2016-08-15 LAB — RAPID URINE DRUG SCREEN, HOSP PERFORMED
Amphetamines: NOT DETECTED
BARBITURATES: NOT DETECTED
BENZODIAZEPINES: NOT DETECTED
Cocaine: NOT DETECTED
Opiates: NOT DETECTED
Tetrahydrocannabinol: NOT DETECTED

## 2016-08-15 LAB — PROTIME-INR
INR: 1.18
Prothrombin Time: 15.1 seconds (ref 11.4–15.2)

## 2016-08-15 LAB — ETHANOL

## 2016-08-15 LAB — PROCALCITONIN: Procalcitonin: 0.21 ng/mL

## 2016-08-15 MED ORDER — LACOSAMIDE 50 MG PO TABS
200.0000 mg | ORAL_TABLET | Freq: Two times a day (BID) | ORAL | Status: DC
  Administered 2016-08-15 – 2016-08-21 (×13): 200 mg via ORAL
  Filled 2016-08-15 (×13): qty 4

## 2016-08-15 MED ORDER — ASPIRIN EC 81 MG PO TBEC: 81.0000 mg | DELAYED_RELEASE_TABLET | ORAL | Status: DC | PRN

## 2016-08-15 MED ORDER — DEXTROSE 5 % IV SOLN
1.0000 g | INTRAVENOUS | Status: DC
  Administered 2016-08-15 – 2016-08-18 (×4): 1 g via INTRAVENOUS
  Filled 2016-08-15 (×4): qty 10

## 2016-08-15 MED ORDER — ONDANSETRON HCL 4 MG PO TABS: 4.0000 mg | ORAL_TABLET | Freq: Four times a day (QID) | ORAL | Status: DC | PRN

## 2016-08-15 MED ORDER — QUETIAPINE FUMARATE 50 MG PO TABS
50.0000 mg | ORAL_TABLET | Freq: Three times a day (TID) | ORAL | Status: DC
  Administered 2016-08-15 – 2016-08-21 (×21): 50 mg via ORAL
  Filled 2016-08-15 (×21): qty 1

## 2016-08-15 MED ORDER — DOCUSATE SODIUM 100 MG PO CAPS
100.0000 mg | ORAL_CAPSULE | Freq: Two times a day (BID) | ORAL | Status: DC
  Administered 2016-08-15 – 2016-08-21 (×13): 100 mg via ORAL
  Filled 2016-08-15 (×13): qty 1

## 2016-08-15 MED ORDER — CLOPIDOGREL BISULFATE 75 MG PO TABS
75.0000 mg | ORAL_TABLET | Freq: Every day | ORAL | Status: DC
  Administered 2016-08-15 – 2016-08-21 (×7): 75 mg via ORAL
  Filled 2016-08-15 (×7): qty 1

## 2016-08-15 MED ORDER — ACETAMINOPHEN 325 MG PO TABS
650.0000 mg | ORAL_TABLET | Freq: Four times a day (QID) | ORAL | Status: DC | PRN
  Administered 2016-08-17 – 2016-08-21 (×7): 650 mg via ORAL
  Filled 2016-08-15 (×7): qty 2

## 2016-08-15 MED ORDER — SODIUM CHLORIDE 0.9 % IV SOLN
INTRAVENOUS | Status: AC
  Administered 2016-08-15: 05:00:00 via INTRAVENOUS

## 2016-08-15 MED ORDER — ATORVASTATIN CALCIUM 40 MG PO TABS
40.0000 mg | ORAL_TABLET | Freq: Every day | ORAL | Status: DC
  Administered 2016-08-15 – 2016-08-18 (×4): 40 mg via ORAL
  Filled 2016-08-15 (×4): qty 1

## 2016-08-15 MED ORDER — ACETAMINOPHEN 650 MG RE SUPP: 650.0000 mg | Freq: Four times a day (QID) | RECTAL | Status: DC | PRN

## 2016-08-15 MED ORDER — ENOXAPARIN SODIUM 40 MG/0.4ML ~~LOC~~ SOLN
40.0000 mg | SUBCUTANEOUS | Status: DC
  Administered 2016-08-16 – 2016-08-21 (×6): 40 mg via SUBCUTANEOUS
  Filled 2016-08-15 (×7): qty 0.4

## 2016-08-15 MED ORDER — ONDANSETRON HCL 4 MG/2ML IJ SOLN: 4.0000 mg | Freq: Four times a day (QID) | INTRAMUSCULAR | Status: DC | PRN

## 2016-08-15 MED ORDER — LEVETIRACETAM 500 MG PO TABS
1500.0000 mg | ORAL_TABLET | Freq: Two times a day (BID) | ORAL | Status: DC
  Administered 2016-08-15 – 2016-08-21 (×13): 1500 mg via ORAL
  Filled 2016-08-15 (×13): qty 3

## 2016-08-15 MED ORDER — BACITRACIN 500 UNIT/GM EX OINT
1.0000 "application " | TOPICAL_OINTMENT | Freq: Two times a day (BID) | CUTANEOUS | Status: DC
  Administered 2016-08-15 – 2016-08-21 (×14): 1 via TOPICAL
  Filled 2016-08-15: qty 28

## 2016-08-15 MED ORDER — LISINOPRIL 20 MG PO TABS
20.0000 mg | ORAL_TABLET | Freq: Every day | ORAL | Status: DC
  Administered 2016-08-15 – 2016-08-21 (×7): 20 mg via ORAL
  Filled 2016-08-15 (×7): qty 1

## 2016-08-15 MED ORDER — CARBAMAZEPINE 200 MG PO TABS
400.0000 mg | ORAL_TABLET | Freq: Two times a day (BID) | ORAL | Status: DC
  Administered 2016-08-15 – 2016-08-21 (×13): 400 mg via ORAL
  Filled 2016-08-15 (×13): qty 2
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
## 2016-08-15 NOTE — Progress Notes (Signed)
Patient is wet nurse and NT at bedside to clean and provide peri care. Patient holding  fist up telling nurse and tech to "get the fuck out"

## 2016-08-15 NOTE — Progress Notes (Signed)
Pharmacy Antibiotic Note  Lee [name] is a 66 y.o. male admitted on 08/14/2016 with UTI.  Pharmacy has been consulted for rocephin dosing.  Plan: Rocephin 1 Gm IV q24h Rx will sign off as no further adjustments are necessary  Height: 5\' 8"  (172.7 cm) Weight: 158 lb (71.7 kg) IBW/kg (Calculated) : 68.4  Temp (24hrs), Avg:99.6 F (37.6 C), Min:99.1 F (37.3 C), Max:100 F (37.8 C)   Recent Labs Lab 08/14/16 1851 08/14/16 1857 08/14/16 2313  WBC 21.3*  --   --   CREATININE 1.18  --   --   LATICACIDVEN  --  2.37* 1.33    Estimated Creatinine Clearance: 58.8 mL/min (by C-G formula based on SCr of 1.18 mg/dL).    Allergies  Allergen Reactions  . Orange Juice [Orange Oil] Diarrhea  . Penicillins Nausea And Vomiting    Tolerates Zosyn. "might have been an overdose" (01/22/2012) Has patient had a PCN reaction causing immediate rash, facial/tongue/throat swelling, SOB or lightheadedness with hypotension: yes Has patient had a PCN reaction causing severe rash involving mucus membranes or skin necrosis: unknown Has patient had a PCN reaction that required hospitalization: no Has patient had a PCN reaction occurring within the last 10 years: no If all of the above answers are "NO", then may proceed with Cephalosporin u  . Vicodin [Hydrocodone-Acetaminophen] Other (See Comments)    "triggered seizure both here and at home when he tried to take it" (01/22/2012)  . Oxycodone     Causes Seizures    Antimicrobials this admission: 8/7 zosyn >> x1 ED 8/8 rocephin >>   Dose adjustments this admission:   Microbiology results:  BCx:   UCx:    Sputum:    MRSA PCR:   Thank you for allowing pharmacy to be a part of this patient's care.  Lorenza EvangelistGreen, Dwanna Goshert R 08/15/2016 2:32 AM

## 2016-08-15 NOTE — ED Notes (Signed)
Attempts by two staff to obtain ethanol level labs unsuccessful

## 2016-08-15 NOTE — ED Notes (Signed)
Attempted to call report to 1300 placed on extended hold will attempt second call in 10 min.

## 2016-08-15 NOTE — Progress Notes (Signed)
PROGRESS NOTE    Lee [name]  ZOX:[id number] DOB: [birth year] DOA: 08/14/2016 PCP: Mirna Mires, MD    Brief Narrative:  Patient is a 66 year old gentleman history of CVA, seizures, PVD, ETOH abuse, COPD presented with injuries after fall noted to be altered mental status. Patient noted to have a fever with a temperature 100.6 workup including urinalysis was worse over UTI. Patient placed empirically on IV antibiotics and admitted.   Assessment & Plan:   Principal Problem:   UTI (urinary tract infection) Active Problems:   Seizures (HCC)   Essential hypertension   Dementia with behavioral disturbance  #1 UTI Patient with prior history of UTI with underlying dementia similar presentation admitted to [facility] 3 weeks prior to admission and IVC presented with a fall. Urinalysis done on admission was concerning for UTI. Urine cultures pending. Blood cultures pending. Continue empiric IV Rocephin. Supportive care. Follow.  #2 dementia with behavioral disturbance Patient apparently tried to escape while his caregiver was in the restroom and fell with minor injuries. Caregiver concern for altered mental status. Patient likely altered mental status secondary to his dementia in the setting of acute UTI. Patient placed on Seroquel. Follow.  #3 seizure disorder Stable. No seizures noted. Continue Vimpat, Keppra, carbamazepine.  #4 hypertension with history of peripheral vascular disease Stable. Continue lisinopril, Plavix, aspirin, Lipitor.  #5 history of CVA Continue Plavix aspirin and Lipitor.  #6 leukocytosis Likely secondary to problem #1. Patient independent culture. Placenta is trending down. Continue empiric IV Rocephin.   DVT prophylaxis: Lovenox Code Status: Full Family Communication: No family at bedside. Disposition Plan: Pending progress. Likely needs placement.   Consultants:   None  Procedures:   CT head, CT C-spine, CT maxillofacial  08/14/2016  Chest x-ray 08/14/2016  Antimicrobials:  IV Rocephin 08/15/2016   Subjective: Patient sleeping easily arousable. Patient denies chest pain. No shortness of breath.  Objective: Vitals:   08/15/16 0200 08/15/16 0300 08/15/16 0410 08/15/16 1505  BP: 128/75  127/71 (!) 151/86  Pulse: 72 70 69 70  Resp: 16 18 16 16   Temp:   98.6 F (37 C) 98.2 F (36.8 C)  TempSrc:   Oral Oral  SpO2: 98% 100% 100% 100%  Weight:   70.9 kg (156 lb 4.8 oz)   Height:   5\' 8"  (1.727 m)     Intake/Output Summary (Last 24 hours) at 08/15/16 2153 Last data filed at 08/15/16 1805  Gross per 24 hour  Intake           433.33 ml  Output              450 ml  Net           -16.67 ml   Filed Weights   08/14/16 1830 08/15/16 0410  Weight: 71.7 kg (158 lb) 70.9 kg (156 lb 4.8 oz)    Examination:  General exam: Appears calm and comfortable.Upper lip laceration. Respiratory system: Clear to auscultation anterior lung fields. Respiratory effort normal. Cardiovascular system: S1 & S2 heard, RRR. No JVD, murmurs, rubs, gallops or clicks. No pedal edema. Gastrointestinal system: Abdomen is nondistended, soft and some tenderness in suprapubic region. No organomegaly or masses felt. Normal bowel sounds heard. Central nervous system: Alert and oriented. No focal neurological deficits. Extremities: Symmetric 5 x 5 power. Skin: No rashes, lesions or ulcers Psychiatry: Judgement and insight appear poor. Mood & affect appropriate.     Data Reviewed: I have personally reviewed following labs and imaging studies  CBC:  Recent Labs Lab 08/14/16 1851 08/15/16 1036  WBC 21.3* 15.3*  NEUTROABS 18.8* 11.2*  HGB 14.0 12.1*  HCT 39.9 35.7*  MCV 93.0 93.2  PLT 366 294   Basic Metabolic Panel:  Recent Labs Lab 08/14/16 1851 08/15/16 1036  NA 137 137  K 4.8 3.7  CL 101 105  CO2 27 25  GLUCOSE 92 92  BUN 15 10  CREATININE 1.18 0.79  CALCIUM 9.0 8.2*   GFR: Estimated Creatinine  Clearance: 86.7 mL/min (by C-G formula based on SCr of 0.79 mg/dL). Liver Function Tests:  Recent Labs Lab 08/14/16 1851  AST 30  ALT 17  ALKPHOS 99  BILITOT 0.8  PROT 7.8  ALBUMIN 3.8   No results for input(s): LIPASE, AMYLASE in the last 168 hours. No results for input(s): AMMONIA in the last 168 hours. Coagulation Profile:  Recent Labs Lab 08/15/16 1036  INR 1.18   Cardiac Enzymes: No results for input(s): CKTOTAL, CKMB, CKMBINDEX, TROPONINI in the last 168 hours. BNP (last 3 results) No results for input(s): PROBNP in the last 8760 hours. HbA1C: No results for input(s): HGBA1C in the last 72 hours. CBG: No results for input(s): GLUCAP in the last 168 hours. Lipid Profile: No results for input(s): CHOL, HDL, LDLCALC, TRIG, CHOLHDL, LDLDIRECT in the last 72 hours. Thyroid Function Tests: No results for input(s): TSH, T4TOTAL, FREET4, T3FREE, THYROIDAB in the last 72 hours. Anemia Panel: No results for input(s): VITAMINB12, FOLATE, FERRITIN, TIBC, IRON, RETICCTPCT in the last 72 hours. Sepsis Labs:  Recent Labs Lab 08/14/16 1857 08/14/16 2313 08/15/16 1033 08/15/16 1036  PROCALCITON  --   --   --  0.21  LATICACIDVEN 2.37* 1.33 1.1  --     Recent Results (from the past 240 hour(s))  Blood Culture (routine x 2)     Status: None (Preliminary result)   Collection Time: 08/14/16  6:52 PM  Result Value Ref Range Status   Specimen Description BLOOD LEFT ANTECUBITAL  Final   Special Requests   Final    BOTTLES DRAWN AEROBIC AND ANAEROBIC Blood Culture adequate volume   Culture   Final    NO GROWTH < 24 HOURS Performed at [facility], [address] [place], [place] [address]    Report Status PENDING  Incomplete         Radiology Studies: Dg Chest 2 View  Result Date: 08/14/2016 CLINICAL DATA:  Fever, confusion and dementia EXAM: CHEST  2 VIEW COMPARISON:  07/25/2016 FINDINGS: The heart size and mediastinal contours are within normal limits. Both  lungs are clear. Mild degenerative change along the upper and midthoracic spine. IMPRESSION: No active cardiopulmonary disease. Electronically Signed   By: Tollie Ethavid  Kwon M.D.   On: 08/14/2016 20:14   Ct Head Wo Contrast  Result Date: 08/14/2016 CLINICAL DATA:  66 year old male with history of trauma after falling. Dementia. No loss of consciousness. EXAM: CT HEAD WITHOUT CONTRAST CT MAXILLOFACIAL WITHOUT CONTRAST CT CERVICAL SPINE WITHOUT CONTRAST TECHNIQUE: Multidetector CT imaging of the head, cervical spine, and maxillofacial structures were performed using the standard protocol without intravenous contrast. Multiplanar CT image reconstructions of the cervical spine and maxillofacial structures were also generated. COMPARISON:  None. FINDINGS: CT HEAD FINDINGS Brain: Mild cerebral atrophy. Patchy and confluent areas of decreased attenuation are noted throughout the deep and periventricular white matter of the cerebral hemispheres bilaterally, compatible with chronic microvascular ischemic disease. No evidence of acute infarction, hemorrhage, hydrocephalus, extra-axial collection or mass lesion/mass effect. Vascular: No hyperdense  vessel or unexpected calcification. Skull: Normal. Negative for fracture or focal lesion. Other: None. CT MAXILLOFACIAL FINDINGS Osseous: No fracture or mandibular dislocation. No destructive process. Orbits: Negative. No traumatic or inflammatory finding. Sinuses: Clear. Soft tissues: Negative. CT CERVICAL SPINE FINDINGS Alignment: Normal. Skull base and vertebrae: No acute fracture. No primary bone lesion or focal pathologic process. Soft tissues and spinal canal: No prevertebral fluid or swelling. No visible canal hematoma. Disc levels: Multilevel degenerative disc disease, most pronounced at C5-C6 and C6-C7. Mild multilevel facet arthropathy. Upper chest: Mild emphysematous changes. Other: None. IMPRESSION: 1. No evidence of significant acute traumatic injury to the skull, facial  bones, brain or cervical spine. 2. Mild cerebral atrophy with chronic microvascular ischemic changes in the cerebral white matter. 3. Mild multilevel degenerative disc disease and cervical spondylosis, as above. Electronically Signed   By: Trudie Reed M.D.   On: 08/14/2016 20:14   Ct Cervical Spine Wo Contrast  Result Date: 08/14/2016 CLINICAL DATA:  66 year old male with history of trauma after falling. Dementia. No loss of consciousness. EXAM: CT HEAD WITHOUT CONTRAST CT MAXILLOFACIAL WITHOUT CONTRAST CT CERVICAL SPINE WITHOUT CONTRAST TECHNIQUE: Multidetector CT imaging of the head, cervical spine, and maxillofacial structures were performed using the standard protocol without intravenous contrast. Multiplanar CT image reconstructions of the cervical spine and maxillofacial structures were also generated. COMPARISON:  None. FINDINGS: CT HEAD FINDINGS Brain: Mild cerebral atrophy. Patchy and confluent areas of decreased attenuation are noted throughout the deep and periventricular white matter of the cerebral hemispheres bilaterally, compatible with chronic microvascular ischemic disease. No evidence of acute infarction, hemorrhage, hydrocephalus, extra-axial collection or mass lesion/mass effect. Vascular: No hyperdense vessel or unexpected calcification. Skull: Normal. Negative for fracture or focal lesion. Other: None. CT MAXILLOFACIAL FINDINGS Osseous: No fracture or mandibular dislocation. No destructive process. Orbits: Negative. No traumatic or inflammatory finding. Sinuses: Clear. Soft tissues: Negative. CT CERVICAL SPINE FINDINGS Alignment: Normal. Skull base and vertebrae: No acute fracture. No primary bone lesion or focal pathologic process. Soft tissues and spinal canal: No prevertebral fluid or swelling. No visible canal hematoma. Disc levels: Multilevel degenerative disc disease, most pronounced at C5-C6 and C6-C7. Mild multilevel facet arthropathy. Upper chest: Mild emphysematous changes.  Other: None. IMPRESSION: 1. No evidence of significant acute traumatic injury to the skull, facial bones, brain or cervical spine. 2. Mild cerebral atrophy with chronic microvascular ischemic changes in the cerebral white matter. 3. Mild multilevel degenerative disc disease and cervical spondylosis, as above. Electronically Signed   By: Trudie Reed M.D.   On: 08/14/2016 20:14   Ct Maxillofacial Wo Cm  Result Date: 08/14/2016 CLINICAL DATA:  66 year old male with history of trauma after falling. Dementia. No loss of consciousness. EXAM: CT HEAD WITHOUT CONTRAST CT MAXILLOFACIAL WITHOUT CONTRAST CT CERVICAL SPINE WITHOUT CONTRAST TECHNIQUE: Multidetector CT imaging of the head, cervical spine, and maxillofacial structures were performed using the standard protocol without intravenous contrast. Multiplanar CT image reconstructions of the cervical spine and maxillofacial structures were also generated. COMPARISON:  None. FINDINGS: CT HEAD FINDINGS Brain: Mild cerebral atrophy. Patchy and confluent areas of decreased attenuation are noted throughout the deep and periventricular white matter of the cerebral hemispheres bilaterally, compatible with chronic microvascular ischemic disease. No evidence of acute infarction, hemorrhage, hydrocephalus, extra-axial collection or mass lesion/mass effect. Vascular: No hyperdense vessel or unexpected calcification. Skull: Normal. Negative for fracture or focal lesion. Other: None. CT MAXILLOFACIAL FINDINGS Osseous: No fracture or mandibular dislocation. No destructive process. Orbits: Negative. No traumatic or inflammatory finding.  Sinuses: Clear. Soft tissues: Negative. CT CERVICAL SPINE FINDINGS Alignment: Normal. Skull base and vertebrae: No acute fracture. No primary bone lesion or focal pathologic process. Soft tissues and spinal canal: No prevertebral fluid or swelling. No visible canal hematoma. Disc levels: Multilevel degenerative disc disease, most pronounced at  C5-C6 and C6-C7. Mild multilevel facet arthropathy. Upper chest: Mild emphysematous changes. Other: None. IMPRESSION: 1. No evidence of significant acute traumatic injury to the skull, facial bones, brain or cervical spine. 2. Mild cerebral atrophy with chronic microvascular ischemic changes in the cerebral white matter. 3. Mild multilevel degenerative disc disease and cervical spondylosis, as above. Electronically Signed   By: Trudie Reed M.D.   On: 08/14/2016 20:14        Scheduled Meds: . atorvastatin  40 mg Oral q1800  . bacitracin  1 application Topical BID  . carbamazepine  400 mg Oral BID  . clopidogrel  75 mg Oral Daily  . docusate sodium  100 mg Oral BID  . enoxaparin (LOVENOX) injection  40 mg Subcutaneous Q24H  . lacosamide  200 mg Oral BID  . levETIRAcetam  1,500 mg Oral BID  . lisinopril  20 mg Oral Daily  . QUEtiapine  50 mg Oral TID   Continuous Infusions: . cefTRIAXone (ROCEPHIN)  IV Stopped (08/15/16 0543)     LOS: 0 days    Time spent: 35 minutes    THOMPSON,DANIEL, MD [facility] Pager [phone]  If 7PM-7AM, please contact night-coverage [email or web address] Password [facility] 08/15/2016, 9:53 PM

## 2016-08-16 DIAGNOSIS — M5134 Other intervertebral disc degeneration, thoracic region: Secondary | ICD-10-CM | POA: Diagnosis not present

## 2016-08-16 DIAGNOSIS — Z87440 Personal history of urinary (tract) infections: Secondary | ICD-10-CM | POA: Diagnosis not present

## 2016-08-16 DIAGNOSIS — M6281 Muscle weakness (generalized): Secondary | ICD-10-CM | POA: Diagnosis not present

## 2016-08-16 DIAGNOSIS — Y92480 Sidewalk as the place of occurrence of the external cause: Secondary | ICD-10-CM | POA: Diagnosis not present

## 2016-08-16 DIAGNOSIS — Z8249 Family history of ischemic heart disease and other diseases of the circulatory system: Secondary | ICD-10-CM | POA: Diagnosis not present

## 2016-08-16 DIAGNOSIS — G40909 Epilepsy, unspecified, not intractable, without status epilepticus: Secondary | ICD-10-CM | POA: Diagnosis present

## 2016-08-16 DIAGNOSIS — F0391 Unspecified dementia with behavioral disturbance: Secondary | ICD-10-CM | POA: Diagnosis not present

## 2016-08-16 DIAGNOSIS — Z8673 Personal history of transient ischemic attack (TIA), and cerebral infarction without residual deficits: Secondary | ICD-10-CM | POA: Diagnosis not present

## 2016-08-16 DIAGNOSIS — Z7902 Long term (current) use of antithrombotics/antiplatelets: Secondary | ICD-10-CM | POA: Diagnosis not present

## 2016-08-16 DIAGNOSIS — N390 Urinary tract infection, site not specified: Secondary | ICD-10-CM | POA: Diagnosis not present

## 2016-08-16 DIAGNOSIS — A419 Sepsis, unspecified organism: Secondary | ICD-10-CM | POA: Diagnosis present

## 2016-08-16 DIAGNOSIS — Z833 Family history of diabetes mellitus: Secondary | ICD-10-CM | POA: Diagnosis not present

## 2016-08-16 DIAGNOSIS — Z87891 Personal history of nicotine dependence: Secondary | ICD-10-CM | POA: Diagnosis not present

## 2016-08-16 DIAGNOSIS — J449 Chronic obstructive pulmonary disease, unspecified: Secondary | ICD-10-CM | POA: Diagnosis present

## 2016-08-16 DIAGNOSIS — R531 Weakness: Secondary | ICD-10-CM | POA: Diagnosis present

## 2016-08-16 DIAGNOSIS — I739 Peripheral vascular disease, unspecified: Secondary | ICD-10-CM | POA: Diagnosis not present

## 2016-08-16 DIAGNOSIS — R29898 Other symptoms and signs involving the musculoskeletal system: Secondary | ICD-10-CM | POA: Diagnosis not present

## 2016-08-16 DIAGNOSIS — S01511A Laceration without foreign body of lip, initial encounter: Secondary | ICD-10-CM | POA: Diagnosis not present

## 2016-08-16 DIAGNOSIS — M160 Bilateral primary osteoarthritis of hip: Secondary | ICD-10-CM | POA: Diagnosis not present

## 2016-08-16 DIAGNOSIS — Y9302 Activity, running: Secondary | ICD-10-CM | POA: Diagnosis not present

## 2016-08-16 DIAGNOSIS — Z5189 Encounter for other specified aftercare: Secondary | ICD-10-CM | POA: Diagnosis not present

## 2016-08-16 DIAGNOSIS — W010XXA Fall on same level from slipping, tripping and stumbling without subsequent striking against object, initial encounter: Secondary | ICD-10-CM | POA: Diagnosis not present

## 2016-08-16 DIAGNOSIS — Z23 Encounter for immunization: Secondary | ICD-10-CM | POA: Diagnosis not present

## 2016-08-16 DIAGNOSIS — M79609 Pain in unspecified limb: Secondary | ICD-10-CM | POA: Diagnosis not present

## 2016-08-16 DIAGNOSIS — M79604 Pain in right leg: Secondary | ICD-10-CM | POA: Diagnosis not present

## 2016-08-16 DIAGNOSIS — R410 Disorientation, unspecified: Secondary | ICD-10-CM | POA: Diagnosis present

## 2016-08-16 DIAGNOSIS — M1711 Unilateral primary osteoarthritis, right knee: Secondary | ICD-10-CM | POA: Diagnosis not present

## 2016-08-16 DIAGNOSIS — R569 Unspecified convulsions: Secondary | ICD-10-CM | POA: Diagnosis not present

## 2016-08-16 DIAGNOSIS — Z823 Family history of stroke: Secondary | ICD-10-CM | POA: Diagnosis not present

## 2016-08-16 DIAGNOSIS — M79605 Pain in left leg: Secondary | ICD-10-CM | POA: Diagnosis not present

## 2016-08-16 DIAGNOSIS — F0151 Vascular dementia with behavioral disturbance: Secondary | ICD-10-CM | POA: Diagnosis present

## 2016-08-16 DIAGNOSIS — Z7982 Long term (current) use of aspirin: Secondary | ICD-10-CM | POA: Diagnosis not present

## 2016-08-16 DIAGNOSIS — I10 Essential (primary) hypertension: Secondary | ICD-10-CM | POA: Diagnosis not present

## 2016-08-16 DIAGNOSIS — M79662 Pain in left lower leg: Secondary | ICD-10-CM | POA: Diagnosis not present

## 2016-08-16 DIAGNOSIS — M79661 Pain in right lower leg: Secondary | ICD-10-CM | POA: Diagnosis not present

## 2016-08-16 DIAGNOSIS — M609 Myositis, unspecified: Secondary | ICD-10-CM | POA: Diagnosis present

## 2016-08-16 DIAGNOSIS — R2689 Other abnormalities of gait and mobility: Secondary | ICD-10-CM | POA: Diagnosis not present

## 2016-08-16 DIAGNOSIS — M1712 Unilateral primary osteoarthritis, left knee: Secondary | ICD-10-CM | POA: Diagnosis not present

## 2016-08-16 LAB — CBC WITH DIFFERENTIAL/PLATELET
Basophils Absolute: 0 10*3/uL (ref 0.0–0.1)
Basophils Relative: 0 %
EOS ABS: 0.1 10*3/uL (ref 0.0–0.7)
EOS PCT: 1 %
HCT: 37.2 % — ABNORMAL LOW (ref 39.0–52.0)
HEMOGLOBIN: 13.3 g/dL (ref 13.0–17.0)
LYMPHS ABS: 2 10*3/uL (ref 0.7–4.0)
LYMPHS PCT: 14 %
MCH: 32.3 pg (ref 26.0–34.0)
MCHC: 35.8 g/dL (ref 30.0–36.0)
MCV: 90.3 fL (ref 78.0–100.0)
MONOS PCT: 7 %
Monocytes Absolute: 0.9 10*3/uL (ref 0.1–1.0)
Neutro Abs: 11.4 10*3/uL — ABNORMAL HIGH (ref 1.7–7.7)
Neutrophils Relative %: 79 %
PLATELETS: 225 10*3/uL (ref 150–400)
RBC: 4.12 MIL/uL — ABNORMAL LOW (ref 4.22–5.81)
RDW: 14.6 % (ref 11.5–15.5)
WBC: 14.2 10*3/uL — ABNORMAL HIGH (ref 4.0–10.5)

## 2016-08-16 LAB — BASIC METABOLIC PANEL
Anion gap: 9 (ref 5–15)
BUN: 6 mg/dL (ref 6–20)
CALCIUM: 8.3 mg/dL — AB (ref 8.9–10.3)
CO2: 22 mmol/L (ref 22–32)
CREATININE: 0.81 mg/dL (ref 0.61–1.24)
Chloride: 104 mmol/L (ref 101–111)
GFR calc Af Amer: >60 mL/min (ref >60)
GFR calc non Af Amer: >60 mL/min (ref >60)
GLUCOSE: 152 mg/dL — AB (ref 65–99)
Potassium: 3.8 mmol/L (ref 3.5–5.1)
Sodium: 135 mmol/L (ref 135–145)

## 2016-08-16 LAB — URINE CULTURE
CULTURE: NO GROWTH
SPECIAL REQUESTS: NORMAL

## 2016-08-16 NOTE — Clinical Social Work Note (Signed)
Clinical Social Work Assessment  Patient Details  Name: Lee [name] MRN: [id number] Date of Birth: 1950/11/07  Date of referral:  08/16/16               Reason for consult:  Discharge Planning                Permission sought to share information with:  Family Supports Permission granted to share information::  Yes, Verbal Permission Granted  Name::     wife Estuardo Frisbee  Agency::     Relationship::     Contact Information:     Housing/Transportation Living arrangements for the past 2 months:  [place] of Information:  Medical Team, Spouse (sister) Patient Interpreter Needed:  None Criminal Activity/Legal Involvement Pertinent to Current Situation/Hospitalization:  No - Comment as needed Significant Relationships:  Other Family Members, Siblings, Spouse Lives with:  Spouse Do you feel safe going back to the place where you live?  Yes Need for family participation in patient care:  Yes (Comment) (yes pt confused- dementia)  Care giving concerns:  Pt from home where he resides with his wife. Pt has dementia and becomes confused and requires much assistance per wife. She is looking into memory care ALFs but not ready financially for this move yet (is working to have pt qualify for Medicaid or to arrange their finances to be able to pay privately).  Wife states pt was admitted to [facility] behavioral unit 1 month ago due to becoming increasingly confused and aggressive with her. Prior to that no psychiatric history. States "they started him on Seroquel and he calmed down really well. He hasn't been aggressive since he got home 3 weeks ago." Denies that pt has outpatient psychiatric provider due to no need in the past- meds are prescribed by PCP and wife mentions there is talk of a referral to neurologist also.  Wife states pt has been to SNF in the past for ST rehab and due to increased falls at home is hoping he will qualify for that level of care again following  this hospitalization. She is very familiar with placement process. She states she would consider long term care SNF going forward as well but not until pt could qualify for Medicaid coverage.    Social Worker assessment / plan:  CSW consulted for potential facility placement.  Met with pt at bedside- pt not very interactive, responded to CSW pleasantly and was oriented to person/place only. Sister at bedside and recommended pt's wife at work but that she would have details about pt's social situation.  Spoke with wife via phone- see discussion above. Wife concerned about pt's increasing number of falls at home and unsteadiness that pt would need skilled rehab (went once before to [facility] and did well per her report). States if pt does not qualify for SNF, her plan at DC is to have pt return home and hire 24/7 sitter to assist her with supervision for him. States she feels safe with pt in the home- notes he has become aggressive with her in the past prior to his hospitalization at [facility] but that since his discharge home 3 weeks ago "he has been calm, I think his medications are working pretty well."   States her long-term goal is to arrange for pt to move into a memory care facility but she is still working out finances (see above). CSW provided wife with list of private duty aid agencies.   Plan: TBD- potential for SNF  v. Home in care of wife with HH/Private duty aides assisting.   Employment status:  Retired Nurse, adult PT Recommendations:   (pending) Information / Referral to community resources:     Patient/Family's Response to care:  Pt does not respond- wife appreciative of care  Patient/Family's Understanding of and Emotional Response to Diagnosis, Current Treatment, and Prognosis:  Wife shows thorough understanding of pt's plan and prognosis. Is well versed in placement process, resources, and potential barriers. Expresses hope that pt will qualify for  skilled care but also very accepting of alternate outcomes.   Emotional Assessment Appearance:  Appears stated age Attitude/Demeanor/Rapport:   (calm, not interactive) Affect (typically observed):  Flat, Calm Orientation:  Oriented to Self, Oriented to Place Alcohol / Substance use:  Not Applicable Psych involvement (Current and /or in the community):  Yes (Comment) (was recently in geriatric behavioral unit - denies any outpatient provider )  Discharge Needs  Concerns to be addressed:  Discharge Planning Concerns Readmission within the last 30 days:  No Current discharge risk:   (still assessing) Barriers to Discharge:  Continued Medical Work up   [employer], LCSW 08/16/2016, 2:56 PM  [phone]

## 2016-08-16 NOTE — Clinical Social Work Placement (Signed)
CLINICAL SOCIAL WORK PLACEMENT  NOTE  Date:  08/16/2016  Patient Details  Name: [name] MRN: [id number] Date of Birth: Apr 04, 1950  Clinical Social Work is seeking post-discharge placement for this patient at the Skilled  Nursing Facility level of care (*CSW will initial, date and re-position this form in  chart as items are completed):  Yes   Patient/family provided with [facility] Clinical Social Work Department's list of facilities offering this level of care within the geographic area requested by the patient (or if unable, by the patient's family).  Yes   Patient/family informed of their freedom to choose among providers that offer the needed level of care, that participate in Medicare, Medicaid or managed care program needed by the patient, have an available bed and are willing to accept the patient.  Yes   Patient/family informed of [facility]'s ownership interest in [facility] and [facility], as well as of the fact that they are under no obligation to receive care at these facilities.  PASRR submitted to EDS on 08/16/16     PASRR number received on       Existing PASRR number confirmed on 08/16/16 (lacking dementia dx, updated on 08/16/16)     FL2 transmitted to all facilities in geographic area requested by pt/family on 08/16/16     FL2 transmitted to all facilities within larger geographic area on       Patient informed that his/her managed care company has contracts with or will negotiate with certain facilities, including the following:            Patient/family informed of bed offers received.  Patient chooses bed at       Physician recommends and patient chooses bed at      Patient to be transferred to   on  .  Patient to be transferred to facility by       Patient family notified on   of transfer.  Name of family member notified:        PHYSICIAN Please sign FL2     Additional Comment:     _______________________________________________ Raye Sorrow, LCSW 08/16/2016, 6:17 PM

## 2016-08-16 NOTE — [place] FL2 (Signed)
[place] MEDICAID FL2 LEVEL OF CARE SCREENING TOOL     IDENTIFICATION  Patient Name: Lee [name] Birthdate: 06/29/1950 Sex: male Admission Date (Current Location): 08/14/2016  [facility] [place]   Facility and Address:  [facility],  [address], [place] [id number]      Provider Number: [id number]  Attending Physician Name and Address:  Rodolph Bonghompson, Daniel V, MD  Relative Name and Phone Number:       Current Level of Care: Hospital Recommended Level of Care: Skilled Nursing Facility Prior Approval Number:    Date Approved/Denied:   PASRR Number:  pending  Discharge Plan: SNF    Current Diagnoses: Patient Active Problem List   Diagnosis Date Noted  . UTI (urinary tract infection) 08/15/2016  . Aggression 07/26/2016  . Dementia with behavioral disturbance 07/26/2016  . Elevated troponin 12/30/2015  . Chest pain 12/30/2015  . Pneumonia, likely aspiration 11/15/2013  . Status epilepticus (HCC) 11/14/2013  . Essential hypertension 11/14/2013  . Cerebral thrombosis with cerebral infarction (HCC) 11/07/2013  . Seizures (HCC) 11/06/2013  . Chronic airway obstruction, not elsewhere classified 09/22/2013  . Cerebral infarction due to unspecified mechanism 09/22/2013  . Hyperlipidemia 09/18/2013  . Encephalopathy acute 09/11/2013  . Altered mental status 09/11/2013  . CVA (cerebral infarction) 09/11/2013  . Hyponatremia 09/11/2013  . Acute encephalopathy 09/11/2013  . Aftercare following surgery of the circulatory system, NEC 08/25/2012  . Foot swelling 02/04/2012  . Drainage from wound 01/04/2012  . Peripheral vascular disease, unspecified (HCC) 11/05/2011  . Pain in limb 11/05/2011  . 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 PAD (peripheral artery disease) (HCC) 10/28/2011  . TOBACCO ABUSE 02/06/2007  . SYMPTOM, EDEMA 06/13/2006  . ERECTILE DYSFUNCTION 01/25/2006    Orientation RESPIRATION  BLADDER Height & Weight     Self, Situation, Place  Normal Incontinent Weight: 156 lb 4.8 oz (70.9 kg) Height:  5\' 8"  (172.7 cm)  BEHAVIORAL SYMPTOMS/MOOD NEUROLOGICAL BOWEL NUTRITION STATUS      Continent Diet (see DC summary)  AMBULATORY STATUS COMMUNICATION OF NEEDS Skin   Extensive Assist Verbally Normal                       Personal Care Assistance Level of Assistance  Bathing, Feeding, Dressing Bathing Assistance: Maximum assistance Feeding assistance: Limited assistance Dressing Assistance: Maximum assistance     Functional Limitations Info  Sight, Hearing, Speech Sight Info: Adequate Hearing Info: Adequate Speech Info: Adequate    SPECIAL CARE FACTORS FREQUENCY  PT (By licensed PT), OT (By licensed OT)     PT Frequency: 5x OT Frequency: 5x            Contractures Contractures Info: Not present    Additional Factors Info  Code Status, Allergies, Psychotropic Code Status Info: Full Code Allergies Info:  Orange Juice Orange Oil, Penicillins, Vicodin Hydrocodone-acetaminophen, Oxycodone Psychotropic Info: see med list         Current Medications (08/16/2016):  This is the current hospital active medication list Current Facility-Administered Medications  Medication Dose Route Frequency Provider Last Rate Last Dose  . acetaminophen (TYLENOL) tablet 650 mg  650 mg Oral Q6H PRN Jonah BlueYates, Jennifer, MD       Or  . acetaminophen (TYLENOL) suppository 650 mg  650 mg Rectal Q6H PRN Jonah BlueYates, Jennifer, MD      . aspirin EC tablet 81 mg  81 mg Oral Q4H PRN Jonah BlueYates, Jennifer, MD      .  atorvastatin (LIPITOR) tablet 40 mg  40 mg Oral q1800 Jonah Blue, MD   40 mg at 08/16/16 1601  . bacitracin ointment 1 application  1 application Topical BID Jonah Blue, MD   1 application at 08/16/16 1033  . carbamazepine (TEGRETOL) tablet 400 mg  400 mg Oral BID Jonah Blue, MD   400 mg at 08/16/16 1032  . cefTRIAXone (ROCEPHIN) 1 g in dextrose 5 % 50 mL IVPB  1 g Intravenous  Q24H Jonah Blue, MD   Stopped at 08/16/16 [phone]  . clopidogrel (PLAVIX) tablet 75 mg  75 mg Oral Daily Jonah Blue, MD   75 mg at 08/16/16 1032  . docusate sodium (COLACE) capsule 100 mg  100 mg Oral BID Jonah Blue, MD   100 mg at 08/16/16 1032  . enoxaparin (LOVENOX) injection 40 mg  40 mg Subcutaneous Q24H Jonah Blue, MD   40 mg at 08/16/16 1032  . lacosamide (VIMPAT) tablet 200 mg  200 mg Oral BID Jonah Blue, MD   200 mg at 08/16/16 1032  . levETIRAcetam (KEPPRA) tablet 1,500 mg  1,500 mg Oral BID Jonah Blue, MD   1,500 mg at 08/16/16 1557  . lisinopril (PRINIVIL,ZESTRIL) tablet 20 mg  20 mg Oral Daily Jonah Blue, MD   20 mg at 08/16/16 1032  . ondansetron (ZOFRAN) tablet 4 mg  4 mg Oral Q6H PRN Jonah Blue, MD       Or  . ondansetron [facility]) injection 4 mg  4 mg Intravenous Q6H PRN Jonah Blue, MD      . QUEtiapine (SEROQUEL) tablet 50 mg  50 mg Oral TID Jonah Blue, MD   50 mg at 08/16/16 1557     Discharge Medications: Please see discharge summary for a list of discharge medications.  Relevant Imaging Results:  Relevant Lab Results:   Additional Information SSN:  [id number]  Raye Sorrow, LCSW

## 2016-08-16 NOTE — Evaluation (Signed)
Physical Therapy Evaluation Patient Details Name: Lee [name] MRN: [id number] DOB: [birth year] Today's Date: 08/16/2016   History of Present Illness  Patient is a 66 year old gentleman history of CVA, seizures, PVD, ETOH abuse, COPD presented with injuries after fall noted to be altered mental status. Patient noted to have a fever with a temperature 100.6 workup including urinalysis was worse over UTI.  Clinical Impression  *the patient indicates pain in the left UE and both legs during mobility to  Sitting on the bed edge. Unable  To stand today. Patient has a history of fall. Pt admitted with above diagnosis. Pt currently with functional limitations due to the deficits listed below (see PT Problem List).  Pt will benefit from skilled PT to increase their independence and safety with mobility to allow discharge to the venue listed below.      Follow Up Recommendations SNF;Supervision/Assistance - 24 hour    Equipment Recommendations  None recommended by PT    Recommendations for Other Services       Precautions / Restrictions Precautions Precautions: Fall Precaution Comments: can be agitated and aggressive      Mobility  Bed Mobility Overal bed mobility: Needs Assistance Bed Mobility: Supine to Sit;Sit to Supine     Supine to sit: Max assist;+2 for safety/equipment;+2 for physical assistance Sit to supine: Max assist;+2 for safety/equipment;+2 for physical assistance   General bed mobility comments: assist with legs and trunk, multimodal cues  Transfers Overall transfer level: Needs assistance Equipment used: Rolling walker (2 wheeled) Transfers: Sit to/from Stand Sit to Stand: Total assist;+2 physical assistance;+2 safety/equipment;From elevated surface         General transfer comment: assisted left hand to RW, able to reach with right. assist ed to power up to stand  with inability to clear the bed . Indicates legs are hurting.  Ambulation/Gait                Stairs            Wheelchair Mobility    Modified Rankin (Stroke Patients Only)       Balance                                             Pertinent Vitals/Pain Pain Assessment: Faces Faces Pain Scale: Hurts even more Pain Location: indicates that his legs hurt when atrtempts to move and stand Pain Descriptors / Indicators: Discomfort;Grimacing;Guarding;Moaning Pain Intervention(s): Monitored during session    Home Living Family/patient expects to be discharged to:: Private residence Living Arrangements: Spouse/significant other Available Help at Discharge: Family;Personal care attendant Type of Home: House Home Access: Stairs to enter   Secretary/administrator of Steps: 3     Additional Comments: history taken from prior admission, no family present    Prior Function           Comments: apparantly able to ambulate as brought in due to running out of house and sustained a fall     Hand Dominance        Extremity/Trunk Assessment   Upper Extremity Assessment Upper Extremity Assessment: LUE deficits/detail;RUE deficits/detail RUE Deficits / Details: pill rolling  at times with tremor LUE Deficits / Details: tends to not open the hand, appeared to have pain with attempt to place hand on RW    Lower Extremity Assessment Lower Extremity Assessment: RLE deficits/detail;LLE deficits/detail RLE Deficits / Details:  does not bear weight, indicates discomfort in the leg LLE Deficits / Details: similar to right    Cervical / Trunk Assessment Cervical / Trunk Assessment: Kyphotic  Communication   Communication: No difficulties  Cognition Arousal/Alertness: Awake/alert Behavior During Therapy: WFL for tasks assessed/performed Overall Cognitive Status: Impaired/Different from baseline Area of Impairment: Orientation;Memory;Following commands;Awareness                 Orientation Level: Place;Time;Situation     Following  Commands: Follows one step commands inconsistently              General Comments      Exercises     Assessment/Plan    PT Assessment Patient needs continued PT services  PT Problem List Decreased strength;Decreased range of motion;Decreased activity tolerance;Decreased balance;Decreased mobility;Decreased knowledge of precautions;Decreased safety awareness;Decreased knowledge of use of DME;Decreased cognition;Pain       PT Treatment Interventions DME instruction;Gait training;Functional mobility training;Therapeutic activities;Patient/family education;Cognitive remediation    PT Goals (Current goals can be found in the Care Plan section)  Acute Rehab PT Goals Patient Stated Goal: none stated PT Goal Formulation: Patient unable to participate in goal setting Time For Goal Achievement: 08/30/16 Potential to Achieve Goals: Fair    Frequency Min 3X/week   Barriers to discharge Decreased caregiver support      Co-evaluation               AM-PAC PT "6 Clicks" Daily Activity  Outcome Measure Difficulty turning over in bed (including adjusting bedclothes, sheets and blankets)?: Total Difficulty moving from lying on back to sitting on the side of the bed? : Total Difficulty sitting down on and standing up from a chair with arms (e.g., wheelchair, bedside commode, etc,.)?: Total Help needed moving to and from a bed to chair (including a wheelchair)?: Total Help needed walking in hospital room?: Total Help needed climbing 3-5 steps with a railing? : Total 6 Click Score: 6    End of Session   Activity Tolerance: Patient limited by pain Patient left: in bed;with call bell/phone within reach;with bed alarm set Nurse Communication: Mobility status PT Visit Diagnosis: Difficulty in walking, not elsewhere classified (R26.2);History of falling (Z91.81)    Time: 2956-2130 PT Time Calculation (min) (ACUTE ONLY): 13 min   Charges:   PT Evaluation $PT Eval Low Complexity:  1 Low     PT G CodesBlanchard Kelch PT [phone]   Rada Hay 08/16/2016, 4:57 PM

## 2016-08-16 NOTE — Progress Notes (Signed)
PROGRESS NOTE    Lee [name]  WUJ:[id number] DOB: 1950-12-15 DOA: 08/14/2016 PCP: Mirna Mires, MD    Brief Narrative:  Patient is a 66 year old gentleman history of CVA, seizures, PVD, ETOH abuse, COPD presented with injuries after fall noted to be altered mental status. Patient noted to have a fever with a temperature 100.6 workup including urinalysis was worse over UTI. Patient placed empirically on IV antibiotics and admitted.   Assessment & Plan:   Principal Problem:   UTI (urinary tract infection) Active Problems:   Seizures (HCC)   Essential hypertension   Dementia with behavioral disturbance  #1 UTI Patient with prior history of UTI with underlying dementia similar presentation admitted to [facility] 3 weeks prior to admission and IVC presented with a fall. Urinalysis done on admission was concerning for UTI. Urine cultures negative. Blood cultures pending. Continue empiric IV Rocephin. Patient improving clinically. Will transition from IV Rocephin to oral Ceftin tomorrow to complete a course of antibiotic treatment as patient has improved clinically. Follow.  #2 dementia with behavioral disturbance Patient apparently tried to escape while his caregiver was in the restroom and fell with minor injuries. Caregiver concern for altered mental status. Patient likely altered mental status secondary to his dementia in the setting of acute UTI. Patient has been calm. Continue Seroquel. Follow.  #3 seizure disorder Stable. No seizures noted. Continue Vimpat, Keppra, carbamazepine.  #4 hypertension with history of peripheral vascular disease Stable. Continue lisinopril, Plavix, aspirin, Lipitor.  #5 history of CVA Continue Plavix aspirin and Lipitor.  #6 leukocytosis Likely secondary to problem #1. Cultures pending. Urine culture negative. Continue IV Rocephin through today and transition to oral Ceftin tomorrow to complete a course of antibiotic treatment.     DVT prophylaxis: Lovenox Code Status: Full Family Communication: No family at bedside. Disposition Plan: Pending progress. Likely needs placement.   Consultants:   None  Procedures:   CT head, CT C-spine, CT maxillofacial 08/14/2016  Chest x-ray 08/14/2016  Antimicrobials:  IV Rocephin 08/15/2016   Subjective: Patient alert. Patient denies any chest pain or shortness of breath. Patient states feeling better.   Objective: Vitals:   08/15/16 0410 08/15/16 1505 08/16/16 0010 08/16/16 0608  BP: 127/71 (!) 151/86 137/81 135/77  Pulse: 69 70 83 77  Resp: 16 16 16 20   Temp: 98.6 F (37 C) 98.2 F (36.8 C) 99.2 F (37.3 C) 98.8 F (37.1 C)  TempSrc: Oral Oral Oral Oral  SpO2: 100% 100% 97% 100%  Weight: 70.9 kg (156 lb 4.8 oz)     Height: 5\' 8"  (1.727 m)       Intake/Output Summary (Last 24 hours) at 08/16/16 1119 Last data filed at 08/16/16 9562  Gross per 24 hour  Intake              650 ml  Output             1350 ml  Net             -700 ml   Filed Weights   08/14/16 1830 08/15/16 0410  Weight: 71.7 kg (158 lb) 70.9 kg (156 lb 4.8 oz)    Examination:  General exam: Appears calm and comfortable.Upper lip laceration. Respiratory system: Clear to auscultation anterior lung fields. Respiratory effort normal. Cardiovascular system: S1 & S2 heard, RRR. No JVD, murmurs, rubs, gallops or clicks. No pedal edema. Gastrointestinal system: Abdomen is nondistended, soft and some tenderness in suprapubic region. No organomegaly or masses felt. Normal bowel  sounds heard. Central nervous system: Alert and oriented. No focal neurological deficits. Extremities: Symmetric 5 x 5 power. Skin: No rashes, lesions or ulcers Psychiatry: Judgement and insight appear poor. Mood & affect appropriate.     Data Reviewed: I have personally reviewed following labs and imaging studies  CBC:  Recent Labs Lab 08/14/16 1851 08/15/16 1036 08/16/16 0823  WBC 21.3* 15.3* 14.2*   NEUTROABS 18.8* 11.2* 11.4*  HGB 14.0 12.1* 13.3  HCT 39.9 35.7* 37.2*  MCV 93.0 93.2 90.3  PLT 366 294 225   Basic Metabolic Panel:  Recent Labs Lab 08/14/16 1851 08/15/16 1036 08/16/16 0823  NA 137 137 135  K 4.8 3.7 3.8  CL 101 105 104  CO2 27 25 22   GLUCOSE 92 92 152*  BUN 15 10 6   CREATININE 1.18 0.79 0.81  CALCIUM 9.0 8.2* 8.3*   GFR: Estimated Creatinine Clearance: 85.6 mL/min (by C-G formula based on SCr of 0.81 mg/dL). Liver Function Tests:  Recent Labs Lab 08/14/16 1851  AST 30  ALT 17  ALKPHOS 99  BILITOT 0.8  PROT 7.8  ALBUMIN 3.8   No results for input(s): LIPASE, AMYLASE in the last 168 hours. No results for input(s): AMMONIA in the last 168 hours. Coagulation Profile:  Recent Labs Lab 08/15/16 1036  INR 1.18   Cardiac Enzymes: No results for input(s): CKTOTAL, CKMB, CKMBINDEX, TROPONINI in the last 168 hours. BNP (last 3 results) No results for input(s): PROBNP in the last 8760 hours. HbA1C: No results for input(s): HGBA1C in the last 72 hours. CBG: No results for input(s): GLUCAP in the last 168 hours. Lipid Profile: No results for input(s): CHOL, HDL, LDLCALC, TRIG, CHOLHDL, LDLDIRECT in the last 72 hours. Thyroid Function Tests: No results for input(s): TSH, T4TOTAL, FREET4, T3FREE, THYROIDAB in the last 72 hours. Anemia Panel: No results for input(s): VITAMINB12, FOLATE, FERRITIN, TIBC, IRON, RETICCTPCT in the last 72 hours. Sepsis Labs:  Recent Labs Lab 08/14/16 1857 08/14/16 2313 08/15/16 1033 08/15/16 1036 08/15/16 2300  PROCALCITON  --   --   --  0.21  --   LATICACIDVEN 2.37* 1.33 1.1  --  1.5    Recent Results (from the past 240 hour(s))  Blood Culture (routine x 2)     Status: None (Preliminary result)   Collection Time: 08/14/16  6:52 PM  Result Value Ref Range Status   Specimen Description BLOOD LEFT ANTECUBITAL  Final   Special Requests   Final    BOTTLES DRAWN AEROBIC AND ANAEROBIC Blood Culture adequate  volume   Culture   Final    NO GROWTH < 24 HOURS Performed at [facility], [address], [place], [place] [address]    Report Status PENDING  Incomplete  Urine culture     Status: None   Collection Time: 08/14/16 10:47 PM  Result Value Ref Range Status   Specimen Description URINE, CATHETERIZED  Final   Special Requests Normal  Final   Culture   Final    NO GROWTH Performed at [facility], [address], [place], [place] [address]    Report Status 08/16/2016 FINAL  Final         Radiology Studies: Dg Chest 2 View  Result Date: 08/14/2016 CLINICAL DATA:  Fever, confusion and dementia EXAM: CHEST  2 VIEW COMPARISON:  07/25/2016 FINDINGS: The heart size and mediastinal contours are within normal limits. Both lungs are clear. Mild degenerative change along the upper and midthoracic spine. IMPRESSION: No  active cardiopulmonary disease. Electronically Signed   By: Tollie Eth M.D.   On: 08/14/2016 20:14   Ct Head Wo Contrast  Result Date: 08/14/2016 CLINICAL DATA:  66 year old male with history of trauma after falling. Dementia. No loss of consciousness. EXAM: CT HEAD WITHOUT CONTRAST CT MAXILLOFACIAL WITHOUT CONTRAST CT CERVICAL SPINE WITHOUT CONTRAST TECHNIQUE: Multidetector CT imaging of the head, cervical spine, and maxillofacial structures were performed using the standard protocol without intravenous contrast. Multiplanar CT image reconstructions of the cervical spine and maxillofacial structures were also generated. COMPARISON:  None. FINDINGS: CT HEAD FINDINGS Brain: Mild cerebral atrophy. Patchy and confluent areas of decreased attenuation are noted throughout the deep and periventricular white matter of the cerebral hemispheres bilaterally, compatible with chronic microvascular ischemic disease. No evidence of acute infarction, hemorrhage, hydrocephalus, extra-axial collection or mass lesion/mass effect. Vascular: No hyperdense vessel or unexpected calcification.  Skull: Normal. Negative for fracture or focal lesion. Other: None. CT MAXILLOFACIAL FINDINGS Osseous: No fracture or mandibular dislocation. No destructive process. Orbits: Negative. No traumatic or inflammatory finding. Sinuses: Clear. Soft tissues: Negative. CT CERVICAL SPINE FINDINGS Alignment: Normal. Skull base and vertebrae: No acute fracture. No primary bone lesion or focal pathologic process. Soft tissues and spinal canal: No prevertebral fluid or swelling. No visible canal hematoma. Disc levels: Multilevel degenerative disc disease, most pronounced at C5-C6 and C6-C7. Mild multilevel facet arthropathy. Upper chest: Mild emphysematous changes. Other: None. IMPRESSION: 1. No evidence of significant acute traumatic injury to the skull, facial bones, brain or cervical spine. 2. Mild cerebral atrophy with chronic microvascular ischemic changes in the cerebral white matter. 3. Mild multilevel degenerative disc disease and cervical spondylosis, as above. Electronically Signed   By: Trudie Reed M.D.   On: 08/14/2016 20:14   Ct Cervical Spine Wo Contrast  Result Date: 08/14/2016 CLINICAL DATA:  66 year old male with history of trauma after falling. Dementia. No loss of consciousness. EXAM: CT HEAD WITHOUT CONTRAST CT MAXILLOFACIAL WITHOUT CONTRAST CT CERVICAL SPINE WITHOUT CONTRAST TECHNIQUE: Multidetector CT imaging of the head, cervical spine, and maxillofacial structures were performed using the standard protocol without intravenous contrast. Multiplanar CT image reconstructions of the cervical spine and maxillofacial structures were also generated. COMPARISON:  None. FINDINGS: CT HEAD FINDINGS Brain: Mild cerebral atrophy. Patchy and confluent areas of decreased attenuation are noted throughout the deep and periventricular white matter of the cerebral hemispheres bilaterally, compatible with chronic microvascular ischemic disease. No evidence of acute infarction, hemorrhage, hydrocephalus, extra-axial  collection or mass lesion/mass effect. Vascular: No hyperdense vessel or unexpected calcification. Skull: Normal. Negative for fracture or focal lesion. Other: None. CT MAXILLOFACIAL FINDINGS Osseous: No fracture or mandibular dislocation. No destructive process. Orbits: Negative. No traumatic or inflammatory finding. Sinuses: Clear. Soft tissues: Negative. CT CERVICAL SPINE FINDINGS Alignment: Normal. Skull base and vertebrae: No acute fracture. No primary bone lesion or focal pathologic process. Soft tissues and spinal canal: No prevertebral fluid or swelling. No visible canal hematoma. Disc levels: Multilevel degenerative disc disease, most pronounced at C5-C6 and C6-C7. Mild multilevel facet arthropathy. Upper chest: Mild emphysematous changes. Other: None. IMPRESSION: 1. No evidence of significant acute traumatic injury to the skull, facial bones, brain or cervical spine. 2. Mild cerebral atrophy with chronic microvascular ischemic changes in the cerebral white matter. 3. Mild multilevel degenerative disc disease and cervical spondylosis, as above. Electronically Signed   By: Trudie Reed M.D.   On: 08/14/2016 20:14   Ct Maxillofacial Wo Cm  Result Date: 08/14/2016 CLINICAL DATA:  66 year old male with  history of trauma after falling. Dementia. No loss of consciousness. EXAM: CT HEAD WITHOUT CONTRAST CT MAXILLOFACIAL WITHOUT CONTRAST CT CERVICAL SPINE WITHOUT CONTRAST TECHNIQUE: Multidetector CT imaging of the head, cervical spine, and maxillofacial structures were performed using the standard protocol without intravenous contrast. Multiplanar CT image reconstructions of the cervical spine and maxillofacial structures were also generated. COMPARISON:  None. FINDINGS: CT HEAD FINDINGS Brain: Mild cerebral atrophy. Patchy and confluent areas of decreased attenuation are noted throughout the deep and periventricular white matter of the cerebral hemispheres bilaterally, compatible with chronic microvascular  ischemic disease. No evidence of acute infarction, hemorrhage, hydrocephalus, extra-axial collection or mass lesion/mass effect. Vascular: No hyperdense vessel or unexpected calcification. Skull: Normal. Negative for fracture or focal lesion. Other: None. CT MAXILLOFACIAL FINDINGS Osseous: No fracture or mandibular dislocation. No destructive process. Orbits: Negative. No traumatic or inflammatory finding. Sinuses: Clear. Soft tissues: Negative. CT CERVICAL SPINE FINDINGS Alignment: Normal. Skull base and vertebrae: No acute fracture. No primary bone lesion or focal pathologic process. Soft tissues and spinal canal: No prevertebral fluid or swelling. No visible canal hematoma. Disc levels: Multilevel degenerative disc disease, most pronounced at C5-C6 and C6-C7. Mild multilevel facet arthropathy. Upper chest: Mild emphysematous changes. Other: None. IMPRESSION: 1. No evidence of significant acute traumatic injury to the skull, facial bones, brain or cervical spine. 2. Mild cerebral atrophy with chronic microvascular ischemic changes in the cerebral white matter. 3. Mild multilevel degenerative disc disease and cervical spondylosis, as above. Electronically Signed   By: Trudie Reedaniel  Entrikin M.D.   On: 08/14/2016 20:14        Scheduled Meds: . atorvastatin  40 mg Oral q1800  . bacitracin  1 application Topical BID  . carbamazepine  400 mg Oral BID  . clopidogrel  75 mg Oral Daily  . docusate sodium  100 mg Oral BID  . enoxaparin (LOVENOX) injection  40 mg Subcutaneous Q24H  . lacosamide  200 mg Oral BID  . levETIRAcetam  1,500 mg Oral BID  . lisinopril  20 mg Oral Daily  . QUEtiapine  50 mg Oral TID   Continuous Infusions: . cefTRIAXone (ROCEPHIN)  IV Stopped (08/16/16 0643)     LOS: 0 days    Time spent: 35 minutes    THOMPSON,DANIEL, MD [facility] Pager [phone]  If 7PM-7AM, please contact night-coverage [email or web address] Password [email or web address] 08/16/2016, 11:19 AM

## 2016-08-17 ENCOUNTER — Inpatient Hospital Stay (HOSPITAL_COMMUNITY): Payer: Medicare Other

## 2016-08-17 DIAGNOSIS — M79604 Pain in right leg: Secondary | ICD-10-CM | POA: Clinically undetermined

## 2016-08-17 DIAGNOSIS — M79605 Pain in left leg: Secondary | ICD-10-CM | POA: Clinically undetermined

## 2016-08-17 LAB — CBC
HCT: 37.9 % — ABNORMAL LOW (ref 39.0–52.0)
Hemoglobin: 12.9 g/dL — ABNORMAL LOW (ref 13.0–17.0)
MCH: 31.5 pg (ref 26.0–34.0)
MCHC: 34 g/dL (ref 30.0–36.0)
MCV: 92.4 fL (ref 78.0–100.0)
PLATELETS: 260 10*3/uL (ref 150–400)
RBC: 4.1 MIL/uL — AB (ref 4.22–5.81)
RDW: 14.5 % (ref 11.5–15.5)
WBC: 13.9 10*3/uL — AB (ref 4.0–10.5)

## 2016-08-17 LAB — BASIC METABOLIC PANEL
ANION GAP: 10 (ref 5–15)
BUN: 5 mg/dL — ABNORMAL LOW (ref 6–20)
CHLORIDE: 101 mmol/L (ref 101–111)
CO2: 22 mmol/L (ref 22–32)
CREATININE: 0.78 mg/dL (ref 0.61–1.24)
Calcium: 8.3 mg/dL — ABNORMAL LOW (ref 8.9–10.3)
GFR calc Af Amer: >60 mL/min (ref >60)
GFR calc non Af Amer: >60 mL/min (ref >60)
Glucose, Bld: 113 mg/dL — ABNORMAL HIGH (ref 65–99)
POTASSIUM: 3.7 mmol/L (ref 3.5–5.1)
SODIUM: 133 mmol/L — AB (ref 135–145)

## 2016-08-17 NOTE — Progress Notes (Signed)
Physical Therapy Treatment Patient Details Name: Lee [name] C Raia MRN: [id number] DOB: 06/14/1950 Today's Date: 08/17/2016    History of Present Illness Patient is a 66 year old gentleman history of CVA, seizures, PVD, ETOH abuse, COPD presented with injuries after fall noted to be altered mental status. Patient noted to have a fever with a temperature 100.6 workup including urinalysis was worse over UTI.    PT Comments    The patient is  Complaining of pain of both legs, expecially left and  Left ankle. Unable  To assist patient sitting today due tonoted reaction to attempts to move the legs. The wife was present and indicates patient was ambulatory without AD. Continue PT  Follow Up Recommendations  SNF;Supervision/Assistance - 24 hour     Equipment Recommendations  None recommended by PT    Recommendations for Other Services       Precautions / Restrictions Precautions Precautions: Fall Precaution Comments: can be agitated and aggressive Restrictions Weight Bearing Restrictions: No    Mobility  Bed Mobility Overal bed mobility: Needs Assistance             General bed mobility comments: attempted to sit EOB, pt could not tolerate due to bil LE pain.  total +2 to roll  Transfers                 General transfer comment: unable to attempt  Ambulation/Gait                 Stairs            Wheelchair Mobility    Modified Rankin (Stroke Patients Only)       Balance                                            Cognition Arousal/Alertness: Awake/alert Behavior During Therapy: WFL for tasks assessed/performed Overall Cognitive Status: Impaired/Different from baseline Area of Impairment: Orientation;Memory;Following commands;Awareness                 Orientation Level: Place;Time;Situation     Following Commands: Follows one step commands inconsistently       General Comments: per chart, cognition is more  impaired.  Evaluation was limited due to pain.  Pt is very limited physically      Exercises      General Comments        Pertinent Vitals/Pain Pain Assessment: Faces Faces Pain Scale: Hurts worst Pain Location: bil LEs, esp L ankle with movement Pain Descriptors / Indicators: Discomfort;Grimacing;Guarding;Moaning Pain Intervention(s): Monitored during session;Patient requesting pain meds-RN notified    Home Living Family/patient expects to be discharged to:: Private residence Living Arrangements: Spouse/significant other Available Help at Discharge: Family;Personal care attendant                Prior Function Level of Independence: Needs assistance      Comments: PCA   PT Goals (current goals can now be found in the care plan section) Acute Rehab PT Goals Patient Stated Goal: none stated Progress towards PT goals: Not progressing toward goals - comment (increased pain of both l;egs)    Frequency    Min 3X/week      PT Plan Current plan remains appropriate    Co-evaluation PT/OT/SLP Co-Evaluation/Treatment: Yes Reason for Co-Treatment: For patient/therapist safety PT goals addressed during session: Mobility/safety with mobility OT goals addressed during session: ADL's and  self-care      AM-PAC PT "6 Clicks" Daily Activity  Outcome Measure  Difficulty turning over in bed (including adjusting bedclothes, sheets and blankets)?: Total Difficulty moving from lying on back to sitting on the side of the bed? : Total Difficulty sitting down on and standing up from a chair with arms (e.g., wheelchair, bedside commode, etc,.)?: Total Help needed moving to and from a bed to chair (including a wheelchair)?: Total Help needed walking in hospital room?: Total Help needed climbing 3-5 steps with a railing? : Total 6 Click Score: 6    End of Session   Activity Tolerance: Patient limited by pain Patient left: in bed;with call bell/phone within reach;with  family/visitor present Nurse Communication: Mobility status PT Visit Diagnosis: Difficulty in walking, not elsewhere classified (R26.2);History of falling (Z91.81)     Time: 1610-9604 PT Time Calculation (min) (ACUTE ONLY): 16 min  Charges:  $Therapeutic Activity: 8-22 mins                    G CodesBlanchard Kelch PT [phone]   Rada Hay 08/17/2016, 9:57 AM

## 2016-08-17 NOTE — Progress Notes (Signed)
CSW following for disposition/ DC planning. PT evaluation recommending SNF for rehab. Referrals made, however PASSR in manual review. Left voicemail for pt's wife- will update her and investigate facility preferences.  Ilean SkillMeghan Patricie Geeslin, MSW, LCSW Clinical Social Work 08/17/2016 [phone]

## 2016-08-17 NOTE — Progress Notes (Signed)
PROGRESS NOTE    Lee [name]  UJW:[id number] DOB: [birth year] DOA: 08/14/2016 PCP: Mirna Mires, MD    Brief Narrative:  Patient is a 66 year old gentleman history of CVA, seizures, PVD, ETOH abuse, COPD presented with injuries after fall noted to be altered mental status. Patient noted to have a fever with a temperature 100.6 workup including urinalysis was worse over UTI. Patient placed empirically on IV antibiotics and admitted.   Assessment & Plan:   Principal Problem:   UTI (urinary tract infection) Active Problems:   Seizures (HCC)   Essential hypertension   Dementia with behavioral disturbance  #1 UTI Patient with prior history of UTI with underlying dementia similar presentation admitted to [facility] 3 weeks prior to admission and IVC presented with a fall. Urinalysis done on admission was concerning for UTI. Urine cultures negative. Blood cultures pending. Patient improving clinically. Will transition from IV Rocephin to oral Ceftin to complete a course of antibiotic treatment as patient has improved clinically. Follow.  #2 dementia with behavioral disturbance Patient apparently tried to escape while his caregiver was in the restroom and fell with minor injuries. Caregiver concern for altered mental status. Patient likely altered mental status secondary to his dementia in the setting of acute UTI. Patient has been calm. Continue Seroquel. Follow.  #3 seizure disorder Stable. No seizures noted. Continue Vimpat, Keppra, carbamazepine.  #4 hypertension with history of peripheral vascular disease Stable. Continue lisinopril, Plavix, aspirin, Lipitor.  #5 history of CVA Continue Plavix aspirin and Lipitor.  #6 leukocytosis Likely secondary to problem #1. Cultures pending. Urine culture negative. Change IV Rocephin to oral Ceftin to complete a course of antibiotic treatment.   #7 bilateral lower extremity pain Will get plain films of his lower  extremities to rule out an acute fracture. Follow.   DVT prophylaxis: Lovenox Code Status: Full Family Communication: No family at bedside. Disposition Plan: Pending progress. Likely needs placement.   Consultants:   None  Procedures:   CT head, CT C-spine, CT maxillofacial 08/14/2016  Chest x-ray 08/14/2016    Antimicrobials:  IV Rocephin 08/15/2016>>>>> 08/17/2016  Oral Ceftin 08/17/2016   Subjective: Patient alert. Per nursing patient complaining of bilateral leg pain. Patient with emesis and nausea today.    Objective: Vitals:   08/16/16 0608 08/16/16 1400 08/16/16 1943 08/17/16 0408  BP: 135/77 (!) 145/79 (!) 143/78 136/77  Pulse: 77 76 81 88  Resp: 20 18 18 16   Temp: 98.8 F (37.1 C) 98.6 F (37 C) 98.8 F (37.1 C) 98.2 F (36.8 C)  TempSrc: Oral Oral Oral Oral  SpO2: 100% 98% 100% 100%  Weight:      Height:        Intake/Output Summary (Last 24 hours) at 08/17/16 1222 Last data filed at 08/17/16 0409  Gross per 24 hour  Intake              720 ml  Output             1000 ml  Net             -280 ml   Filed Weights   08/14/16 1830 08/15/16 0410  Weight: 71.7 kg (158 lb) 70.9 kg (156 lb 4.8 oz)    Examination:  General exam: Appears calm and comfortable.Upper lip laceration. Respiratory system: Clear to auscultation anterior lung fields. Respiratory effort normal. Cardiovascular system: S1 & S2 heard, RRR. No JVD, murmurs, rubs, gallops or clicks. No pedal edema. Gastrointestinal system: Abdomen is nondistended, soft  and some tenderness in suprapubic region. No organomegaly or masses felt. Normal bowel sounds heard. Central nervous system: Alert and oriented. No focal neurological deficits. Extremities: Patient complaining of significant pain whenever bilateral legs are lifted up. Symmetric 5 x 5 power. Skin: No rashes, lesions or ulcers Psychiatry: Judgement and insight appear poor. Mood & affect appropriate.     Data Reviewed: I have  personally reviewed following labs and imaging studies  CBC:  Recent Labs Lab 08/14/16 1851 08/15/16 1036 08/16/16 0823 08/17/16 0358  WBC 21.3* 15.3* 14.2* 13.9*  NEUTROABS 18.8* 11.2* 11.4*  --   HGB 14.0 12.1* 13.3 12.9*  HCT 39.9 35.7* 37.2* 37.9*  MCV 93.0 93.2 90.3 92.4  PLT 366 294 225 260   Basic Metabolic Panel:  Recent Labs Lab 08/14/16 1851 08/15/16 1036 08/16/16 0823 08/17/16 0358  NA 137 137 135 133*  K 4.8 3.7 3.8 3.7  CL 101 105 104 101  CO2 27 25 22 22   GLUCOSE 92 92 152* 113*  BUN 15 10 6  5*  CREATININE 1.18 0.79 0.81 0.78  CALCIUM 9.0 8.2* 8.3* 8.3*   GFR: Estimated Creatinine Clearance: 86.7 mL/min (by C-G formula based on SCr of 0.78 mg/dL). Liver Function Tests:  Recent Labs Lab 08/14/16 1851  AST 30  ALT 17  ALKPHOS 99  BILITOT 0.8  PROT 7.8  ALBUMIN 3.8   No results for input(s): LIPASE, AMYLASE in the last 168 hours. No results for input(s): AMMONIA in the last 168 hours. Coagulation Profile:  Recent Labs Lab 08/15/16 1036  INR 1.18   Cardiac Enzymes: No results for input(s): CKTOTAL, CKMB, CKMBINDEX, TROPONINI in the last 168 hours. BNP (last 3 results) No results for input(s): PROBNP in the last 8760 hours. HbA1C: No results for input(s): HGBA1C in the last 72 hours. CBG: No results for input(s): GLUCAP in the last 168 hours. Lipid Profile: No results for input(s): CHOL, HDL, LDLCALC, TRIG, CHOLHDL, LDLDIRECT in the last 72 hours. Thyroid Function Tests: No results for input(s): TSH, T4TOTAL, FREET4, T3FREE, THYROIDAB in the last 72 hours. Anemia Panel: No results for input(s): VITAMINB12, FOLATE, FERRITIN, TIBC, IRON, RETICCTPCT in the last 72 hours. Sepsis Labs:  Recent Labs Lab 08/14/16 1857 08/14/16 2313 08/15/16 1033 08/15/16 1036 08/15/16 2300  PROCALCITON  --   --   --  0.21  --   LATICACIDVEN 2.37* 1.33 1.1  --  1.5    Recent Results (from the past 240 hour(s))  Blood Culture (routine x 2)      Status: None (Preliminary result)   Collection Time: 08/14/16  6:52 PM  Result Value Ref Range Status   Specimen Description BLOOD LEFT ANTECUBITAL  Final   Special Requests   Final    BOTTLES DRAWN AEROBIC AND ANAEROBIC Blood Culture adequate volume   Culture   Final    NO GROWTH 2 DAYS Performed at [facility], [address] [place], [place] [address]    Report Status PENDING  Incomplete  Urine culture     Status: None   Collection Time: 08/14/16 10:47 PM  Result Value Ref Range Status   Specimen Description URINE, CATHETERIZED  Final   Special Requests Normal  Final   Culture   Final    NO GROWTH Performed at [facility], [address] [place], [place] [address]    Report Status 08/16/2016 FINAL  Final  Blood Culture (routine x 2)     Status: None (Preliminary result)  Collection Time: 08/15/16 10:36 AM  Result Value Ref Range Status   Specimen Description BLOOD LEFT ARM  Final   Special Requests   Final    BOTTLES DRAWN AEROBIC ONLY Blood Culture adequate volume   Culture   Final    NO GROWTH 1 DAY Performed at [facility], [address] [place], [place] [address]    Report Status PENDING  Incomplete         Radiology Studies: No results found.      Scheduled Meds: . atorvastatin  40 mg Oral q1800  . bacitracin  1 application Topical BID  . carbamazepine  400 mg Oral BID  . clopidogrel  75 mg Oral Daily  . docusate sodium  100 mg Oral BID  . enoxaparin (LOVENOX) injection  40 mg Subcutaneous Q24H  . lacosamide  200 mg Oral BID  . levETIRAcetam  1,500 mg Oral BID  . lisinopril  20 mg Oral Daily  . QUEtiapine  50 mg Oral TID   Continuous Infusions: . cefTRIAXone (ROCEPHIN)  IV 1 g (08/17/16 0603)     LOS: 1 day    Time spent: 35 minutes    THOMPSON,DANIEL, MD [facility] Pager [phone]  If 7PM-7AM, please contact night-coverage [email or web address] Password [email or web address] 08/17/2016, 12:22 PM

## 2016-08-17 NOTE — Evaluation (Signed)
Occupational Therapy Evaluation Patient Details Name: Lee [name] MRN: [id number] DOB: 1950-08-22 Today's Date: 08/17/2016    History of Present Illness Patient is a 66 year old gentleman history of CVA, seizures, PVD, ETOH abuse, COPD presented with injuries after fall noted to be altered mental status. Patient noted to have a fever with a temperature 100.6 workup including urinalysis was worse over UTI.   Clinical Impression   Pt was admitted for the above. Unsure of PLOF;pt had PCA assisting and wife.    Pt has limited non-functional movement in LUE and RUE is limited by tremor. Will follow in acute setting focusing on bed mobility and toilet transfers    Follow Up Recommendations  Supervision/Assistance - 24 hour;SNF (vs)    Equipment Recommendations   (to be further assessed)    Recommendations for Other Services       Precautions / Restrictions Precautions Precautions: Fall Restrictions Weight Bearing Restrictions: No      Mobility Bed Mobility               General bed mobility comments: attempted to sit EOB, pt could not tolerate due to bil LE pain.  total +2 to roll  Transfers                 General transfer comment: unable to attempt    Balance                                           ADL either performed or assessed with clinical judgement   ADL Overall ADL's : Needs assistance/impaired                                       General ADL Comments: pt needs total A for adls due to decreased AROM in L and tremor in R.  Could not move to EOB due to LE pain     Vision         Perception     Praxis      Pertinent Vitals/Pain Pain Assessment: Faces Faces Pain Scale: Hurts worst Pain Location: bil LEs, esp L with movement Pain Intervention(s): Limited activity within patient's tolerance;Monitored during session;Repositioned;Patient requesting pain meds-RN notified (PT texted MD)     Hand  Dominance     Extremity/Trunk Assessment Upper Extremity Assessment RUE Deficits / Details: pill rolling  at times with tremor LUE Deficits / Details: very little AROM; lifted shoulder about 20 degrees, can wiggle fingers.  AAROM of shoulder approximately 70 degrees           Communication Communication Communication: No difficulties   Cognition Arousal/Alertness: Awake/alert Behavior During Therapy: WFL for tasks assessed/performed Overall Cognitive Status: Impaired/Different from baseline                                 General Comments: per chart, cognition is more impaired.  Evaluation was limited due to pain.  Pt is very limited physically   General Comments       Exercises     Shoulder Instructions      Home Living Family/patient expects to be discharged to:: Private residence Living Arrangements: Spouse/significant other Available Help at Discharge: Family;Personal care attendant  Prior Functioning/Environment Level of Independence: Needs assistance        Comments: PCA        OT Problem List: Decreased strength;Decreased activity tolerance;Decreased cognition;Pain;Impaired UE functional use      OT Treatment/Interventions: Self-care/ADL training;Therapeutic exercise;Therapeutic activities;Patient/family education;Balance training;Cognitive remediation/compensation;DME and/or AE instruction    OT Goals(Current goals can be found in the care plan section) Acute Rehab OT Goals Patient Stated Goal: none stated OT Goal Formulation: With patient (general terms) Time For Goal Achievement: 08/24/16 Potential to Achieve Goals: Fair ADL Goals Pt Will Transfer to Toilet: with max assist;with +2 assist;bedside commode;stand pivot transfer Additional ADL Goal #1: pt will sit eob x 5 minutes with min A once assisted to sitting Additional ADL Goal #2: pt will perform 10 RUE AROM exercises x 3 muscle groups  to increase strength for bed motility as precursor for toilet transfers  OT Frequency: Min 2X/week   Barriers to D/C:            Co-evaluation PT/OT/SLP Co-Evaluation/Treatment: Yes Reason for Co-Treatment: For patient/therapist safety PT goals addressed during session: Mobility/safety with mobility OT goals addressed during session: Strengthening/ROM      AM-PAC PT "6 Clicks" Daily Activity     Outcome Measure Help from another person eating meals?: Total Help from another person taking care of personal grooming?: Total Help from another person toileting, which includes using toliet, bedpan, or urinal?: Total Help from another person bathing (including washing, rinsing, drying)?: Total Help from another person to put on and taking off regular upper body clothing?: Total Help from another person to put on and taking off regular lower body clothing?: Total 6 Click Score: 6   End of Session    Activity Tolerance: Patient limited by pain Patient left: in bed;with call bell/phone within reach;with bed alarm set  OT Visit Diagnosis: Pain Pain - part of body: Leg (bil)                Time: 5784-6962 OT Time Calculation (min): 16 min Charges:  OT General Charges $OT Visit: 1 Procedure OT Evaluation $OT Eval Low Complexity: 1 Procedure G-Codes:     Lee [name], OTR/L [phone] 08/17/2016  Lee [name] 08/17/2016, 9:35 AM

## 2016-08-17 NOTE — Progress Notes (Signed)
CSW following to assist with possible SNF placement.  PASSR requested additional information which CSW faxed. Awaiting review.  Updated wife with status of referrals and PASSR.  Ilean SkillMeghan Alejos Reinhardt, MSW, LCSW Clinical Social Work 08/17/2016 [phone]

## 2016-08-18 ENCOUNTER — Inpatient Hospital Stay (HOSPITAL_COMMUNITY): Payer: Medicare Other

## 2016-08-18 DIAGNOSIS — M79605 Pain in left leg: Secondary | ICD-10-CM

## 2016-08-18 DIAGNOSIS — M79609 Pain in unspecified limb: Secondary | ICD-10-CM

## 2016-08-18 DIAGNOSIS — M79604 Pain in right leg: Secondary | ICD-10-CM

## 2016-08-18 LAB — CBC WITH DIFFERENTIAL/PLATELET
Basophils Absolute: 0 10*3/uL (ref 0.0–0.1)
Basophils Relative: 0 %
Eosinophils Absolute: 0 10*3/uL (ref 0.0–0.7)
Eosinophils Relative: 0 %
HCT: 37.8 % — ABNORMAL LOW (ref 39.0–52.0)
Hemoglobin: 12.9 g/dL — ABNORMAL LOW (ref 13.0–17.0)
LYMPHS ABS: 1.7 10*3/uL (ref 0.7–4.0)
LYMPHS PCT: 13 %
MCH: 31.4 pg (ref 26.0–34.0)
MCHC: 34.1 g/dL (ref 30.0–36.0)
MCV: 92 fL (ref 78.0–100.0)
MONOS PCT: 11 %
Monocytes Absolute: 1.5 10*3/uL — ABNORMAL HIGH (ref 0.1–1.0)
Neutro Abs: 10.5 10*3/uL — ABNORMAL HIGH (ref 1.7–7.7)
Neutrophils Relative %: 76 %
PLATELETS: 337 10*3/uL (ref 150–400)
RBC: 4.11 MIL/uL — ABNORMAL LOW (ref 4.22–5.81)
RDW: 14.2 % (ref 11.5–15.5)
WBC: 13.7 10*3/uL — ABNORMAL HIGH (ref 4.0–10.5)

## 2016-08-18 LAB — BASIC METABOLIC PANEL
ANION GAP: 12 (ref 5–15)
BUN: 7 mg/dL (ref 6–20)
CHLORIDE: 98 mmol/L — AB (ref 101–111)
CO2: 24 mmol/L (ref 22–32)
Calcium: 8.5 mg/dL — ABNORMAL LOW (ref 8.9–10.3)
Creatinine, Ser: 0.78 mg/dL (ref 0.61–1.24)
GFR calc Af Amer: >60 mL/min (ref >60)
GFR calc non Af Amer: >60 mL/min (ref >60)
GLUCOSE: 106 mg/dL — AB (ref 65–99)
POTASSIUM: 3.6 mmol/L (ref 3.5–5.1)
Sodium: 134 mmol/L — ABNORMAL LOW (ref 135–145)

## 2016-08-18 LAB — URIC ACID: Uric Acid, Serum: 2.7 mg/dL — ABNORMAL LOW (ref 4.4–7.6)

## 2016-08-18 MED ORDER — PREDNISONE 20 MG PO TABS: 40.0000 mg | ORAL_TABLET | Freq: Every day | ORAL | Status: DC

## 2016-08-18 MED ORDER — CEFUROXIME AXETIL 500 MG PO TABS
500.0000 mg | ORAL_TABLET | Freq: Two times a day (BID) | ORAL | Status: AC
  Administered 2016-08-19 – 2016-08-21 (×6): 500 mg via ORAL
  Filled 2016-08-18 (×6): qty 1

## 2016-08-18 MED ORDER — POLYETHYLENE GLYCOL 3350 17 G PO PACK
17.0000 g | PACK | Freq: Two times a day (BID) | ORAL | Status: DC
  Administered 2016-08-18 – 2016-08-21 (×6): 17 g via ORAL
  Filled 2016-08-18 (×6): qty 1

## 2016-08-18 MED ORDER — METHYLPREDNISOLONE SODIUM SUCC 125 MG IJ SOLR
60.0000 mg | Freq: Once | INTRAMUSCULAR | Status: AC
  Administered 2016-08-18: 60 mg via INTRAVENOUS
  Filled 2016-08-18: qty 2

## 2016-08-18 NOTE — Progress Notes (Signed)
PROGRESS NOTE    Lee [name]  ZOX:[id number] DOB: 06-22-1950 DOA: 08/14/2016 PCP: Mirna Mires, MD    Brief Narrative:  Patient is a 66 year old gentleman history of CVA, seizures, PVD, ETOH abuse, COPD presented with injuries after fall noted to be altered mental status. Patient noted to have a fever with a temperature 100.6 workup including urinalysis was worse over UTI. Patient placed empirically on IV antibiotics and admitted.   Assessment & Plan:   Principal Problem:   UTI (urinary tract infection) Active Problems:   Seizures (HCC)   Essential hypertension   Dementia with behavioral disturbance   Leg pain, bilateral  #1 UTI Patient with prior history of UTI with underlying dementia similar presentation admitted to [facility] 3 weeks prior to admission and IVC presented with a fall. Urinalysis done on admission was concerning for UTI. Urine cultures negative. Blood cultures pending. Patient improving clinically. Continue oral Ceftin to complete a course of antibiotic treatment as patient has improved clinically. Follow.  #2 dementia with behavioral disturbance Patient apparently tried to escape while his caregiver was in the restroom and fell with minor injuries. Caregiver concern for altered mental status. Patient likely altered mental status secondary to his dementia in the setting of acute UTI. Patient has been calm. Patient likely close to baseline. Continue Seroquel. Follow.  #3 seizure disorder Stable. No seizures noted. Continue Vimpat, Keppra, carbamazepine.  #4 hypertension with history of peripheral vascular disease Stable. Continue lisinopril, Plavix, aspirin, Lipitor.  #5 history of CVA Continue Plavix aspirin and Lipitor.  #6 leukocytosis Likely secondary to problem #1. Cultures with no growth to date. Patient has been transitioned to oral Ceftin to complete a course of antibiotic treatment.   #7 bilateral lower extremity pain Plain films  of lower extremities negative for acute fracture. Patient with significant peripheral vascular disease. Check ABI. Check a uric acid level. Will give a dose of IV Solu-Medrol empirically for possible gout. Vascular surgery consultation.    DVT prophylaxis: Lovenox Code Status: Full Family Communication: Updated sister at bedside.  Disposition Plan: Pending progress. Pending SNF placement.    Consultants:   Past surgical: Dr. Randie Heinz 08/18/2016  Procedures:   CT head, CT C-spine, CT maxillofacial 08/14/2016  Chest x-ray 08/14/2016  Plain films of bilateral lower extremities 08/17/2016  ABIs 08/18/2016  Antimicrobials:  IV Rocephin 08/15/2016>>>>> 08/17/2016  Oral Ceftin 08/17/2016   Subjective: Patient alert. Patient with significant leg pain left greater than right. No further emesis or nausea. Tolerating current diet.  Objective: Vitals:   08/17/16 1600 08/17/16 1945 08/18/16 0426 08/18/16 0600  BP: 132/72 (!) 147/84 (!) 151/86 (!) 158/88  Pulse: 80 85 84   Resp: 18 16 16    Temp: 98 F (36.7 C) 98.3 F (36.8 C) 98 F (36.7 C)   TempSrc: Oral Oral Oral   SpO2: 100% 99% 100%   Weight:      Height:        Intake/Output Summary (Last 24 hours) at 08/18/16 1245 Last data filed at 08/18/16 0427  Gross per 24 hour  Intake              720 ml  Output             1375 ml  Net             -655 ml   Filed Weights   08/14/16 1830 08/15/16 0410  Weight: 71.7 kg (158 lb) 70.9 kg (156 lb 4.8 oz)    Examination:  General exam: Appears calm and comfortable.Upper lip laceration. Respiratory system: Clear to auscultation anterior lung fields. Respiratory effort normal. Cardiovascular system: S1 & S2 heard, RRR. No JVD, murmurs, rubs, gallops or clicks. No pedal edema. Gastrointestinal system: Abdomen is nondistended, soft and some tenderness in suprapubic region. No organomegaly or masses felt. Normal bowel sounds heard. Central nervous system: Alert and oriented. No  focal neurological deficits. Extremities: Patient with significant pain whenever bilateral legs are lifted up. Symmetric 5 x 5 power. Skin: No rashes, lesions or ulcers Psychiatry: Judgement and insight appear poor. Mood & affect appropriate.     Data Reviewed: I have personally reviewed following labs and imaging studies  CBC:  Recent Labs Lab 08/14/16 1851 08/15/16 1036 08/16/16 0823 08/17/16 0358 08/18/16 0913  WBC 21.3* 15.3* 14.2* 13.9* 13.7*  NEUTROABS 18.8* 11.2* 11.4*  --  10.5*  HGB 14.0 12.1* 13.3 12.9* 12.9*  HCT 39.9 35.7* 37.2* 37.9* 37.8*  MCV 93.0 93.2 90.3 92.4 92.0  PLT 366 294 225 260 337   Basic Metabolic Panel:  Recent Labs Lab 08/14/16 1851 08/15/16 1036 08/16/16 0823 08/17/16 0358 08/18/16 0913  NA 137 137 135 133* 134*  K 4.8 3.7 3.8 3.7 3.6  CL 101 105 104 101 98*  CO2 27 25 22 22 24   GLUCOSE 92 92 152* 113* 106*  BUN 15 10 6  5* 7  CREATININE 1.18 0.79 0.81 0.78 0.78  CALCIUM 9.0 8.2* 8.3* 8.3* 8.5*   GFR: Estimated Creatinine Clearance: 86.7 mL/min (by C-G formula based on SCr of 0.78 mg/dL). Liver Function Tests:  Recent Labs Lab 08/14/16 1851  AST 30  ALT 17  ALKPHOS 99  BILITOT 0.8  PROT 7.8  ALBUMIN 3.8   No results for input(s): LIPASE, AMYLASE in the last 168 hours. No results for input(s): AMMONIA in the last 168 hours. Coagulation Profile:  Recent Labs Lab 08/15/16 1036  INR 1.18   Cardiac Enzymes: No results for input(s): CKTOTAL, CKMB, CKMBINDEX, TROPONINI in the last 168 hours. BNP (last 3 results) No results for input(s): PROBNP in the last 8760 hours. HbA1C: No results for input(s): HGBA1C in the last 72 hours. CBG: No results for input(s): GLUCAP in the last 168 hours. Lipid Profile: No results for input(s): CHOL, HDL, LDLCALC, TRIG, CHOLHDL, LDLDIRECT in the last 72 hours. Thyroid Function Tests: No results for input(s): TSH, T4TOTAL, FREET4, T3FREE, THYROIDAB in the last 72 hours. Anemia Panel: No  results for input(s): VITAMINB12, FOLATE, FERRITIN, TIBC, IRON, RETICCTPCT in the last 72 hours. Sepsis Labs:  Recent Labs Lab 08/14/16 1857 08/14/16 2313 08/15/16 1033 08/15/16 1036 08/15/16 2300  PROCALCITON  --   --   --  0.21  --   LATICACIDVEN 2.37* 1.33 1.1  --  1.5    Recent Results (from the past 240 hour(s))  Blood Culture (routine x 2)     Status: None (Preliminary result)   Collection Time: 08/14/16  6:52 PM  Result Value Ref Range Status   Specimen Description BLOOD LEFT ANTECUBITAL  Final   Special Requests   Final    BOTTLES DRAWN AEROBIC AND ANAEROBIC Blood Culture adequate volume   Culture   Final    NO GROWTH 4 DAYS Performed at [facility], [address], [place], [place] [address]    Report Status PENDING  Incomplete  Urine culture     Status: None   Collection Time: 08/14/16 10:47 PM  Result Value Ref Range Status   Specimen Description URINE,  CATHETERIZED  Final   Special Requests Normal  Final   Culture   Final    NO GROWTH Performed at [facility], [address] [place], [place] [address]    Report Status 08/16/2016 FINAL  Final  Blood Culture (routine x 2)     Status: None (Preliminary result)   Collection Time: 08/15/16 10:36 AM  Result Value Ref Range Status   Specimen Description BLOOD LEFT ARM  Final   Special Requests   Final    BOTTLES DRAWN AEROBIC ONLY Blood Culture adequate volume   Culture   Final    NO GROWTH 3 DAYS Performed at [facility], [address] [place], [place] [address]    Report Status PENDING  Incomplete         Radiology Studies: Dg Tibia/fibula Left  Result Date: 08/17/2016 CLINICAL DATA:  Generalized pain from both hips to both ankles due to recent fall. EXAM: LEFT FEMUR 2 VIEWS; RIGHT FEMUR 2 VIEWS; DG HIP (WITH OR WITHOUT PELVIS) 3-4V BILAT; LEFT TIBIA AND FIBULA - 2 VIEW; RIGHT TIBIA AND FIBULA - 2 VIEW COMPARISON:  Left knee x-rays dated November 17, 2013. FINDINGS: Pelvis: No  pelvic fracture. Mild degenerative changes of the bilateral hip joints. Radiopaque densities projecting over the left pelvis. Diffuse, severe atherosclerotic vascular calcifications. Large amount stool in the rectum. Left lower extremity: There is no evidence of fracture or other focal bone lesions. Chondrocalcinosis. Mild tricompartmental degenerative changes of the knee. Small knee joint effusion. Extensive atherosclerotic vascular calcifications. Right lower extremity: There is no evidence of fracture or other focal bone lesions. Chondrocalcinosis and tricompartmental degenerative changes at the knee. Small knee joint effusion. IMPRESSION: 1. No fracture of the pelvis or bilateral lower extremities. 2. Degenerative changes and chondrocalcinosis of the bilateral knee joints, unchanged. Electronically Signed   By: Obie DredgeWilliam T Derry M.D.   On: 08/17/2016 16:01   Dg Tibia/fibula Right  Result Date: 08/17/2016 CLINICAL DATA:  Generalized pain from both hips to both ankles due to recent fall. EXAM: LEFT FEMUR 2 VIEWS; RIGHT FEMUR 2 VIEWS; DG HIP (WITH OR WITHOUT PELVIS) 3-4V BILAT; LEFT TIBIA AND FIBULA - 2 VIEW; RIGHT TIBIA AND FIBULA - 2 VIEW COMPARISON:  Left knee x-rays dated November 17, 2013. FINDINGS: Pelvis: No pelvic fracture. Mild degenerative changes of the bilateral hip joints. Radiopaque densities projecting over the left pelvis. Diffuse, severe atherosclerotic vascular calcifications. Large amount stool in the rectum. Left lower extremity: There is no evidence of fracture or other focal bone lesions. Chondrocalcinosis. Mild tricompartmental degenerative changes of the knee. Small knee joint effusion. Extensive atherosclerotic vascular calcifications. Right lower extremity: There is no evidence of fracture or other focal bone lesions. Chondrocalcinosis and tricompartmental degenerative changes at the knee. Small knee joint effusion. IMPRESSION: 1. No fracture of the pelvis or bilateral lower extremities.  2. Degenerative changes and chondrocalcinosis of the bilateral knee joints, unchanged. Electronically Signed   By: Obie DredgeWilliam T Derry M.D.   On: 08/17/2016 16:01   Dg Femur Min 2 Views Left  Result Date: 08/17/2016 CLINICAL DATA:  Generalized pain from both hips to both ankles due to recent fall. EXAM: LEFT FEMUR 2 VIEWS; RIGHT FEMUR 2 VIEWS; DG HIP (WITH OR WITHOUT PELVIS) 3-4V BILAT; LEFT TIBIA AND FIBULA - 2 VIEW; RIGHT TIBIA AND FIBULA - 2 VIEW COMPARISON:  Left knee x-rays dated November 17, 2013. FINDINGS: Pelvis: No pelvic fracture. Mild degenerative changes of the bilateral hip joints. Radiopaque densities projecting over the  left pelvis. Diffuse, severe atherosclerotic vascular calcifications. Large amount stool in the rectum. Left lower extremity: There is no evidence of fracture or other focal bone lesions. Chondrocalcinosis. Mild tricompartmental degenerative changes of the knee. Small knee joint effusion. Extensive atherosclerotic vascular calcifications. Right lower extremity: There is no evidence of fracture or other focal bone lesions. Chondrocalcinosis and tricompartmental degenerative changes at the knee. Small knee joint effusion. IMPRESSION: 1. No fracture of the pelvis or bilateral lower extremities. 2. Degenerative changes and chondrocalcinosis of the bilateral knee joints, unchanged. Electronically Signed   By: Obie Dredge M.D.   On: 08/17/2016 16:01   Dg Femur, Min 2 Views Right  Result Date: 08/17/2016 CLINICAL DATA:  Generalized pain from both hips to both ankles due to recent fall. EXAM: LEFT FEMUR 2 VIEWS; RIGHT FEMUR 2 VIEWS; DG HIP (WITH OR WITHOUT PELVIS) 3-4V BILAT; LEFT TIBIA AND FIBULA - 2 VIEW; RIGHT TIBIA AND FIBULA - 2 VIEW COMPARISON:  Left knee x-rays dated November 17, 2013. FINDINGS: Pelvis: No pelvic fracture. Mild degenerative changes of the bilateral hip joints. Radiopaque densities projecting over the left pelvis. Diffuse, severe atherosclerotic vascular  calcifications. Large amount stool in the rectum. Left lower extremity: There is no evidence of fracture or other focal bone lesions. Chondrocalcinosis. Mild tricompartmental degenerative changes of the knee. Small knee joint effusion. Extensive atherosclerotic vascular calcifications. Right lower extremity: There is no evidence of fracture or other focal bone lesions. Chondrocalcinosis and tricompartmental degenerative changes at the knee. Small knee joint effusion. IMPRESSION: 1. No fracture of the pelvis or bilateral lower extremities. 2. Degenerative changes and chondrocalcinosis of the bilateral knee joints, unchanged. Electronically Signed   By: Obie Dredge M.D.   On: 08/17/2016 16:01   Dg Hips Bilat With Pelvis 3-4 Views  Result Date: 08/17/2016 CLINICAL DATA:  Generalized pain from both hips to both ankles due to recent fall. EXAM: LEFT FEMUR 2 VIEWS; RIGHT FEMUR 2 VIEWS; DG HIP (WITH OR WITHOUT PELVIS) 3-4V BILAT; LEFT TIBIA AND FIBULA - 2 VIEW; RIGHT TIBIA AND FIBULA - 2 VIEW COMPARISON:  Left knee x-rays dated November 17, 2013. FINDINGS: Pelvis: No pelvic fracture. Mild degenerative changes of the bilateral hip joints. Radiopaque densities projecting over the left pelvis. Diffuse, severe atherosclerotic vascular calcifications. Large amount stool in the rectum. Left lower extremity: There is no evidence of fracture or other focal bone lesions. Chondrocalcinosis. Mild tricompartmental degenerative changes of the knee. Small knee joint effusion. Extensive atherosclerotic vascular calcifications. Right lower extremity: There is no evidence of fracture or other focal bone lesions. Chondrocalcinosis and tricompartmental degenerative changes at the knee. Small knee joint effusion. IMPRESSION: 1. No fracture of the pelvis or bilateral lower extremities. 2. Degenerative changes and chondrocalcinosis of the bilateral knee joints, unchanged. Electronically Signed   By: Obie Dredge M.D.   On: 08/17/2016  16:01        Scheduled Meds: . atorvastatin  40 mg Oral q1800  . bacitracin  1 application Topical BID  . carbamazepine  400 mg Oral BID  . clopidogrel  75 mg Oral Daily  . docusate sodium  100 mg Oral BID  . enoxaparin (LOVENOX) injection  40 mg Subcutaneous Q24H  . lacosamide  200 mg Oral BID  . levETIRAcetam  1,500 mg Oral BID  . lisinopril  20 mg Oral Daily  . QUEtiapine  50 mg Oral TID   Continuous Infusions: . cefTRIAXone (ROCEPHIN)  IV Stopped (08/18/16 0604)     LOS: 2 days  Time spent: 35 minutes    THOMPSON,DANIEL, MD [facility] Pager [phone]  If 7PM-7AM, please contact night-coverage [email or web address] Password [facility] 08/18/2016, 12:45 PM

## 2016-08-18 NOTE — Clinical Social Work Note (Signed)
CSW checked in [facility]. Patient's PASARR continues to be remain in the "manual review" status.  Unable to place patient until PASARR number is assigned. CSW services will continue to monitor for stability and assignment of PASARR number.  Lorri Frederickonna T. Jaci LazierCrowder, [place] [phone]

## 2016-08-18 NOTE — Progress Notes (Signed)
VASCULAR LAB PRELIMINARY  ARTERIAL  ABI completed: Right ABI of 0.51 and left ABI of 0.62 are suggestive of moderate arterial occlusive disease at rest.  Right TBI of 0.23 and left TBI of 0.3 are suggestive of abnormal arterial flow at rest.   RIGHT    LEFT    PRESSURE WAVEFORM  PRESSURE WAVEFORM  BRACHIAL 162 Triphasic BRACHIAL IV   DP 83 Monophasic DP 94 Monophasic  PT 45 Monophasic PT 100 Monophasic  GREAT TOE 37 NA GREAT TOE 49 NA    RIGHT LEFT  ABI 0.51 0.62     Lee StainGregory J Meko Masterson, RVT 08/18/2016, 4:31 PM

## 2016-08-18 NOTE — Progress Notes (Signed)
Prepared to administer soap suds enema to pt as ordered, and noted that he just had a very large soft incont BM. Enema not given at this time. Continue to monitor. Mick SellShannon Natalyah Cummiskey RN
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
## 2016-08-18 NOTE — Progress Notes (Signed)
Patient's BP 158/88 manually. Oncall MD M. Lynch made aware. No complaints of pain at this time. Will continue to monitor

## 2016-08-19 DIAGNOSIS — M79605 Pain in left leg: Secondary | ICD-10-CM

## 2016-08-19 DIAGNOSIS — M79604 Pain in right leg: Secondary | ICD-10-CM

## 2016-08-19 DIAGNOSIS — R29898 Other symptoms and signs involving the musculoskeletal system: Secondary | ICD-10-CM

## 2016-08-19 LAB — CULTURE, BLOOD (ROUTINE X 2)
Culture: NO GROWTH
SPECIAL REQUESTS: ADEQUATE

## 2016-08-19 LAB — BASIC METABOLIC PANEL
Anion gap: 9 (ref 5–15)
BUN: 7 mg/dL (ref 6–20)
CALCIUM: 8.9 mg/dL (ref 8.9–10.3)
CO2: 28 mmol/L (ref 22–32)
CREATININE: 0.78 mg/dL (ref 0.61–1.24)
Chloride: 97 mmol/L — ABNORMAL LOW (ref 101–111)
GFR calc Af Amer: >60 mL/min (ref >60)
GLUCOSE: 132 mg/dL — AB (ref 65–99)
Potassium: 4.6 mmol/L (ref 3.5–5.1)
Sodium: 134 mmol/L — ABNORMAL LOW (ref 135–145)

## 2016-08-19 LAB — CK: Total CK: 965 U/L — ABNORMAL HIGH (ref 49–397)

## 2016-08-19 MED ORDER — SODIUM CHLORIDE 0.9 % IV SOLN
INTRAVENOUS | Status: AC
  Administered 2016-08-19 – 2016-08-21 (×5): via INTRAVENOUS

## 2016-08-19 MED ORDER — SODIUM CHLORIDE 0.9 % IV BOLUS (SEPSIS)
1000.0000 mL | Freq: Once | INTRAVENOUS | Status: AC
  Administered 2016-08-19: 1000 mL via INTRAVENOUS

## 2016-08-19 NOTE — Progress Notes (Signed)
   ABI's reviewed. Pain unlikely vascular in nature. Will have f/u in office.   Maggie Dworkin C. Randie Heinzain, MD [facility] [place] Office: [phone] Pager: [phone]

## 2016-08-19 NOTE — Progress Notes (Signed)
PROGRESS NOTE    Lee [name]  AOZ:[id number] DOB: [birth year] DOA: 08/14/2016 PCP: Mirna Mires, MD    Brief Narrative:  Patient is a 66 year old gentleman history of CVA, seizures, PVD, ETOH abuse, COPD presented with injuries after fall noted to be altered mental status. Patient noted to have a fever with a temperature 100.6 workup including urinalysis was worse over UTI. Patient placed empirically on IV antibiotics and admitted.   Assessment & Plan:   Principal Problem:   UTI (urinary tract infection) Active Problems:   Seizures (HCC)   Essential hypertension   Dementia with behavioral disturbance   Leg pain, bilateral   Leg weakness, bilateral  #1 UTI Patient with prior history of UTI with underlying dementia similar presentation admitted to [facility] 3 weeks prior to admission and IVC presented with a fall. Urinalysis done on admission was concerning for UTI. Urine cultures negative. Blood cultures pending with no growth to date. Patient improving clinically. Continue oral Ceftin to complete a course of antibiotic treatment as patient has improved clinically. Follow.  #2 dementia with behavioral disturbance Patient apparently tried to escape while his caregiver was in the restroom and fell with minor injuries. Caregiver concern for altered mental status. Patient likely altered mental status secondary to his dementia in the setting of acute UTI. Patient has been calm. Patient likely close to baseline. Continue Seroquel. Follow.  #3 seizure disorder Stable. No seizures noted. Continue Vimpat, Keppra, carbamazepine.  #4 hypertension with history of peripheral vascular disease Stable. Continue lisinopril, Plavix, aspirin, Lipitor. Patient was seen by vascular surgery who feel patient's lower extremity pain and weakness unlikely to be vascular in nature and recommended outpatient follow-up.  #5 history of CVA Continue Plavix aspirin and Lipitor.  #6  leukocytosis Likely secondary to problem #1. Cultures with no growth to date. Patient has been transitioned to oral Ceftin to complete a course of antibiotic treatment.   #7 bilateral lower extremity pain and weakness Plain films of lower extremities negative for acute fracture. Patient with significant peripheral vascular disease. ABI of 0.51 on the right and 0.62 on the left. Uric acid level decreased. Patient status post 1 dose of IV Solu-Medrol. Patient has been seen in consultation by vascular surgery who feel patient's pain is unlikely to be vascular in nature. Due to history of peripheral vascular disease recommended outpatient follow-up in office. Due to patient's weakness and pain CK level was obtained which was elevated at 965. Consult with neurology for further evaluation and management.   DVT prophylaxis: Lovenox Code Status: Full Family Communication: No family at bedside. Disposition Plan: Pending progress. Pending SNF placement.    Consultants:   Vascular surgery: Dr. Randie Heinz 08/18/2016  Neurology pending  Procedures:   CT head, CT C-spine, CT maxillofacial 08/14/2016  Chest x-ray 08/14/2016  Plain films of bilateral lower extremities 08/17/2016  ABIs 08/18/2016  Antimicrobials:  IV Rocephin 08/15/2016>>>>> 08/17/2016  Oral Ceftin 08/17/2016   Subjective: Patient alert. Patient with significant leg pain left greater than right and weakness. Patient can barely lift lower extremities off the bed.Tolerating current diet.  Objective: Vitals:   08/18/16 0600 08/18/16 1415 08/18/16 2139 08/19/16 0609  BP: (!) 158/88 (!) 157/86 107/69 130/83  Pulse:  81 78 73  Resp:  18 16 16   Temp:  99.9 F (37.7 C) 99.6 F (37.6 C) 97.8 F (36.6 C)  TempSrc:  Oral Axillary Oral  SpO2:  99% 96% 100%  Weight:      Height:  Intake/Output Summary (Last 24 hours) at 08/19/16 1502 Last data filed at 08/19/16 0609  Gross per 24 hour  Intake              240 ml  Output              1100 ml  Net             -860 ml   Filed Weights   08/14/16 1830 08/15/16 0410  Weight: 71.7 kg (158 lb) 70.9 kg (156 lb 4.8 oz)    Examination:  General exam: Appears calm and comfortable.Upper lip laceration. Respiratory system: Clear to auscultation anterior lung fields. Respiratory effort normal. Cardiovascular system: S1 & S2 heard, RRR. No JVD, murmurs, rubs, gallops or clicks. No pedal edema. Gastrointestinal system: Abdomen is nondistended, soft and some tenderness in suprapubic region. No organomegaly or masses felt. Normal bowel sounds heard. Central nervous system: Alert and oriented. No focal neurological deficits. Extremities: Patient with significant pain whenever bilateral legs are lifted up. 2-3/5 L > R LE strength Skin: No rashes, lesions or ulcers Psychiatry: Judgement and insight appear poor. Mood & affect appropriate.     Data Reviewed: I have personally reviewed following labs and imaging studies  CBC:  Recent Labs Lab 08/14/16 1851 08/15/16 1036 08/16/16 0823 08/17/16 0358 08/18/16 0913  WBC 21.3* 15.3* 14.2* 13.9* 13.7*  NEUTROABS 18.8* 11.2* 11.4*  --  10.5*  HGB 14.0 12.1* 13.3 12.9* 12.9*  HCT 39.9 35.7* 37.2* 37.9* 37.8*  MCV 93.0 93.2 90.3 92.4 92.0  PLT 366 294 225 260 337   Basic Metabolic Panel:  Recent Labs Lab 08/15/16 1036 08/16/16 0823 08/17/16 0358 08/18/16 0913 08/19/16 0342  NA 137 135 133* 134* 134*  K 3.7 3.8 3.7 3.6 4.6  CL 105 104 101 98* 97*  CO2 25 22 22 24 28   GLUCOSE 92 152* 113* 106* 132*  BUN 10 6 5* 7 7  CREATININE 0.79 0.81 0.78 0.78 0.78  CALCIUM 8.2* 8.3* 8.3* 8.5* 8.9   GFR: Estimated Creatinine Clearance: 86.7 mL/min (by C-G formula based on SCr of 0.78 mg/dL). Liver Function Tests:  Recent Labs Lab 08/14/16 1851  AST 30  ALT 17  ALKPHOS 99  BILITOT 0.8  PROT 7.8  ALBUMIN 3.8   No results for input(s): LIPASE, AMYLASE in the last 168 hours. No results for input(s): AMMONIA in the  last 168 hours. Coagulation Profile:  Recent Labs Lab 08/15/16 1036  INR 1.18   Cardiac Enzymes:  Recent Labs Lab 08/19/16 0345  CKTOTAL 965*   BNP (last 3 results) No results for input(s): PROBNP in the last 8760 hours. HbA1C: No results for input(s): HGBA1C in the last 72 hours. CBG: No results for input(s): GLUCAP in the last 168 hours. Lipid Profile: No results for input(s): CHOL, HDL, LDLCALC, TRIG, CHOLHDL, LDLDIRECT in the last 72 hours. Thyroid Function Tests: No results for input(s): TSH, T4TOTAL, FREET4, T3FREE, THYROIDAB in the last 72 hours. Anemia Panel: No results for input(s): VITAMINB12, FOLATE, FERRITIN, TIBC, IRON, RETICCTPCT in the last 72 hours. Sepsis Labs:  Recent Labs Lab 08/14/16 1857 08/14/16 2313 08/15/16 1033 08/15/16 1036 08/15/16 2300  PROCALCITON  --   --   --  0.21  --   LATICACIDVEN 2.37* 1.33 1.1  --  1.5    Recent Results (from the past 240 hour(s))  Blood Culture (routine x 2)     Status: None   Collection Time: 08/14/16  6:52 PM  Result Value  Ref Range Status   Specimen Description BLOOD LEFT ANTECUBITAL  Final   Special Requests   Final    BOTTLES DRAWN AEROBIC AND ANAEROBIC Blood Culture adequate volume   Culture   Final    NO GROWTH 5 DAYS Performed at [facility], [address], [place], [place] [address]    Report Status 08/19/2016 FINAL  Final  Urine culture     Status: None   Collection Time: 08/14/16 10:47 PM  Result Value Ref Range Status   Specimen Description URINE, CATHETERIZED  Final   Special Requests Normal  Final   Culture   Final    NO GROWTH Performed at [facility], [address] [place], [place] [address]    Report Status 08/16/2016 FINAL  Final  Blood Culture (routine x 2)     Status: None (Preliminary result)   Collection Time: 08/15/16 10:36 AM  Result Value Ref Range Status   Specimen Description BLOOD LEFT ARM  Final   Special Requests   Final    BOTTLES DRAWN AEROBIC ONLY  Blood Culture adequate volume   Culture   Final    NO GROWTH 4 DAYS Performed at [facility], [address], [place], [place] [address]    Report Status PENDING  Incomplete         Radiology Studies: Dg Tibia/fibula Left  Result Date: 08/17/2016 CLINICAL DATA:  Generalized pain from both hips to both ankles due to recent fall. EXAM: LEFT FEMUR 2 VIEWS; RIGHT FEMUR 2 VIEWS; DG HIP (WITH OR WITHOUT PELVIS) 3-4V BILAT; LEFT TIBIA AND FIBULA - 2 VIEW; RIGHT TIBIA AND FIBULA - 2 VIEW COMPARISON:  Left knee x-rays dated November 17, 2013. FINDINGS: Pelvis: No pelvic fracture. Mild degenerative changes of the bilateral hip joints. Radiopaque densities projecting over the left pelvis. Diffuse, severe atherosclerotic vascular calcifications. Large amount stool in the rectum. Left lower extremity: There is no evidence of fracture or other focal bone lesions. Chondrocalcinosis. Mild tricompartmental degenerative changes of the knee. Small knee joint effusion. Extensive atherosclerotic vascular calcifications. Right lower extremity: There is no evidence of fracture or other focal bone lesions. Chondrocalcinosis and tricompartmental degenerative changes at the knee. Small knee joint effusion. IMPRESSION: 1. No fracture of the pelvis or bilateral lower extremities. 2. Degenerative changes and chondrocalcinosis of the bilateral knee joints, unchanged. Electronically Signed   By: Obie Dredge M.D.   On: 08/17/2016 16:01   Dg Tibia/fibula Right  Result Date: 08/17/2016 CLINICAL DATA:  Generalized pain from both hips to both ankles due to recent fall. EXAM: LEFT FEMUR 2 VIEWS; RIGHT FEMUR 2 VIEWS; DG HIP (WITH OR WITHOUT PELVIS) 3-4V BILAT; LEFT TIBIA AND FIBULA - 2 VIEW; RIGHT TIBIA AND FIBULA - 2 VIEW COMPARISON:  Left knee x-rays dated November 17, 2013. FINDINGS: Pelvis: No pelvic fracture. Mild degenerative changes of the bilateral hip joints. Radiopaque densities projecting over the left pelvis.  Diffuse, severe atherosclerotic vascular calcifications. Large amount stool in the rectum. Left lower extremity: There is no evidence of fracture or other focal bone lesions. Chondrocalcinosis. Mild tricompartmental degenerative changes of the knee. Small knee joint effusion. Extensive atherosclerotic vascular calcifications. Right lower extremity: There is no evidence of fracture or other focal bone lesions. Chondrocalcinosis and tricompartmental degenerative changes at the knee. Small knee joint effusion. IMPRESSION: 1. No fracture of the pelvis or bilateral lower extremities. 2. Degenerative changes and chondrocalcinosis of the bilateral knee joints, unchanged. Electronically Signed   By: Chrissie Noa  Howell Pringle M.D.   On: 08/17/2016 16:01   Dg Femur Min 2 Views Left  Result Date: 08/17/2016 CLINICAL DATA:  Generalized pain from both hips to both ankles due to recent fall. EXAM: LEFT FEMUR 2 VIEWS; RIGHT FEMUR 2 VIEWS; DG HIP (WITH OR WITHOUT PELVIS) 3-4V BILAT; LEFT TIBIA AND FIBULA - 2 VIEW; RIGHT TIBIA AND FIBULA - 2 VIEW COMPARISON:  Left knee x-rays dated November 17, 2013. FINDINGS: Pelvis: No pelvic fracture. Mild degenerative changes of the bilateral hip joints. Radiopaque densities projecting over the left pelvis. Diffuse, severe atherosclerotic vascular calcifications. Large amount stool in the rectum. Left lower extremity: There is no evidence of fracture or other focal bone lesions. Chondrocalcinosis. Mild tricompartmental degenerative changes of the knee. Small knee joint effusion. Extensive atherosclerotic vascular calcifications. Right lower extremity: There is no evidence of fracture or other focal bone lesions. Chondrocalcinosis and tricompartmental degenerative changes at the knee. Small knee joint effusion. IMPRESSION: 1. No fracture of the pelvis or bilateral lower extremities. 2. Degenerative changes and chondrocalcinosis of the bilateral knee joints, unchanged. Electronically Signed   By:  Obie Dredge M.D.   On: 08/17/2016 16:01   Dg Femur, Min 2 Views Right  Result Date: 08/17/2016 CLINICAL DATA:  Generalized pain from both hips to both ankles due to recent fall. EXAM: LEFT FEMUR 2 VIEWS; RIGHT FEMUR 2 VIEWS; DG HIP (WITH OR WITHOUT PELVIS) 3-4V BILAT; LEFT TIBIA AND FIBULA - 2 VIEW; RIGHT TIBIA AND FIBULA - 2 VIEW COMPARISON:  Left knee x-rays dated November 17, 2013. FINDINGS: Pelvis: No pelvic fracture. Mild degenerative changes of the bilateral hip joints. Radiopaque densities projecting over the left pelvis. Diffuse, severe atherosclerotic vascular calcifications. Large amount stool in the rectum. Left lower extremity: There is no evidence of fracture or other focal bone lesions. Chondrocalcinosis. Mild tricompartmental degenerative changes of the knee. Small knee joint effusion. Extensive atherosclerotic vascular calcifications. Right lower extremity: There is no evidence of fracture or other focal bone lesions. Chondrocalcinosis and tricompartmental degenerative changes at the knee. Small knee joint effusion. IMPRESSION: 1. No fracture of the pelvis or bilateral lower extremities. 2. Degenerative changes and chondrocalcinosis of the bilateral knee joints, unchanged. Electronically Signed   By: Obie Dredge M.D.   On: 08/17/2016 16:01   Dg Hips Bilat With Pelvis 3-4 Views  Result Date: 08/17/2016 CLINICAL DATA:  Generalized pain from both hips to both ankles due to recent fall. EXAM: LEFT FEMUR 2 VIEWS; RIGHT FEMUR 2 VIEWS; DG HIP (WITH OR WITHOUT PELVIS) 3-4V BILAT; LEFT TIBIA AND FIBULA - 2 VIEW; RIGHT TIBIA AND FIBULA - 2 VIEW COMPARISON:  Left knee x-rays dated November 17, 2013. FINDINGS: Pelvis: No pelvic fracture. Mild degenerative changes of the bilateral hip joints. Radiopaque densities projecting over the left pelvis. Diffuse, severe atherosclerotic vascular calcifications. Large amount stool in the rectum. Left lower extremity: There is no evidence of fracture or other  focal bone lesions. Chondrocalcinosis. Mild tricompartmental degenerative changes of the knee. Small knee joint effusion. Extensive atherosclerotic vascular calcifications. Right lower extremity: There is no evidence of fracture or other focal bone lesions. Chondrocalcinosis and tricompartmental degenerative changes at the knee. Small knee joint effusion. IMPRESSION: 1. No fracture of the pelvis or bilateral lower extremities. 2. Degenerative changes and chondrocalcinosis of the bilateral knee joints, unchanged. Electronically Signed   By: Obie Dredge M.D.   On: 08/17/2016 16:01        Scheduled Meds: . atorvastatin  40 mg Oral q1800  . bacitracin  1 application Topical BID  . carbamazepine  400 mg Oral BID  . cefUROXime  500 mg Oral BID WC  . clopidogrel  75 mg Oral Daily  . docusate sodium  100 mg Oral BID  . enoxaparin (LOVENOX) injection  40 mg Subcutaneous Q24H  . lacosamide  200 mg Oral BID  . levETIRAcetam  1,500 mg Oral BID  . lisinopril  20 mg Oral Daily  . polyethylene glycol  17 g Oral BID  . QUEtiapine  50 mg Oral TID   Continuous Infusions: . sodium chloride 150 mL/hr at 08/19/16 1132     LOS: 3 days    Time spent: 35 minutes    Kashawn Dirr, MD [facility] Pager [phone]  If 7PM-7AM, please contact night-coverage [email or web address] Password [facility] 08/19/2016, 3:02 PM

## 2016-08-19 NOTE — Consult Note (Signed)
Neurology Consultation Reason for Consult: Muscle pain Referring Physician: Grandville Silos, D  CC: Muscle pain  History is obtained from: Patient, wife  HPI: Lee [name] is a 66 y.o. male with a history of vascular dementia, who presented with mild confusion and then asked with urinary tract infection. He was started on ceftriaxone. Over the course of his hospitalization he has been having progressive pain and refusal to walk. His wife states that he has had problems with muscle pain on and off for quite some time, months to years. He has been having progressive difficulty over the past few months of climbing stairs. They have 3 stairs to get her house and she has noticed that he has been having progressive difficulty with this.  He has been taking atorvastatin since 2015. He denies new skin findings.   ROS: A 14 point ROS was performed and is negative except as noted in the HPI.   Past Medical History:  Diagnosis Date  . Aneurysm of femoral artery ([place])   . Arthritis    "anwhere I've been hurt before" (01/22/2012)  . COPD (chronic obstructive pulmonary disease) ([place])   . Dementia   . ETOH abuse   . Gout   . Grand mal epilepsy, controlled ([place]) 1980's   last on 10/10/14  . Hypertension   . Kidney stone   . Peripheral vascular disease ([place])   . Seizures ([place])    last seizures June 2018  . Stroke [facility]) Sept. 2012   denies residual (01/22/2012)     Family History  Problem Relation Age of Onset  . Diabetes Mother   . Aneurysm Mother   . Hypertension Mother   . Stroke Father   . Heart disease Father   . Seizures Other        Nephew  . Brain cancer Other        Nephew  . Colon cancer Maternal Aunt   . Colon cancer Maternal Uncle      Social History:  reports that he quit smoking about 2 years ago. His smoking use included Cigarettes. He has a 42.00 pack-year smoking history. He has never used smokeless tobacco. He reports that he uses drugs, including Marijuana. He  reports that he does not drink alcohol.   Exam: Current vital signs: BP 136/77 (BP Location: Left Arm)   Pulse 81   Temp 98 F (36.7 C) (Oral)   Resp 18   Ht _0  (1.727 m)   Wt 70.9 kg (156 lb 4.8 oz)   SpO2 99%   BMI 23.77 kg/m  Vital signs in last 24 hours: Temp:  [97.8 F (36.6 C)-99.6 F (37.6 C)] 98 F (36.7 C) (08/12 1500) Pulse Rate:  [73-81] 81 (08/12 1500) Resp:  [16-18] 18 (08/12 1500) BP: (107-136)/(69-83) 136/77 (08/12 1500) SpO2:  [96 %-100 %] 99 % (08/12 1500)   Physical Exam  Constitutional: Appears well-developed and well-nourished.  Psych: Affect appropriate to situation Eyes: No scleral injection HENT: No OP obstrucion Head: Normocephalic.  Cardiovascular: Normal rate and regular rhythm.  Respiratory: Effort normal and breath sounds normal to anterior ascultation GI: Soft.  No distension. There is no tenderness.  Skin: WDI  Neuro: Mental Status: Patient is awake, alert, oriented to person only, thinks that he is at home and is unaware of the year. His wife states his baseline. He has some mild perseveration Cranial Nerves: II: Visual Fields are full. Pupils are equal, round, and reactive to light.   III,IV, VI: EOMI without  ptosis or diploplia.  V: Facial sensation is symmetric to temperature VII: Facial movement is symmetric.  VIII: hearing is intact to voice X: Uvula elevates symmetrically XI: Shoulder shrug is symmetric. XII: tongue is midline without atrophy or fasciculations.  Motor: He has good strength in bilateral upper extremities, strength testing in his lower extremities is extremely limited due to severe pain, but he appears to have 3-4/5 strength throughout his lower extremities. He keeps his ankle extended and resists  Passive flexion Sensory: Sensation is symmetric to light touch and temperature in the arms and legs. Deep Tendon Reflexes: 3+ and symmetric in the biceps and patellae.  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 I will send an ESR, CRP though in the setting of an active infection, I am not sure how helpful they will be.  The degree of pain that he is having is not typical of poly myositis or dermatomyositis.   He is on atorvastatin, which is a common cause of myalgias and myositis. I'm not certain if he has been taking more colchicine, but it is a inhibitor of the degradation pathway of his statin. He also is directly mild toxic, so if he had been taking more that recently it could be an explanation for his worsening.  At this point, I think I would favor holding his statin to see if he improves, though if he still is severely affected a steroid trial may be necessary.  He is also slightly hyperreflexic, and therefore I will also order a C-spine MRI, though this would not explain his symptoms completely.  Recommendations: 1) discontinue statin 2) ESR, CRP 3) MRI cervical spine 4) will follow   Roland Rack, MD [facility] [phone]  If 7pm- 7am, please page neurology on call as listed in [place].

## 2016-08-20 ENCOUNTER — Inpatient Hospital Stay (HOSPITAL_COMMUNITY): Payer: Medicare Other

## 2016-08-20 ENCOUNTER — Telehealth: Payer: Self-pay | Admitting: Vascular Surgery

## 2016-08-20 DIAGNOSIS — R52 Pain, unspecified: Secondary | ICD-10-CM

## 2016-08-20 LAB — CULTURE, BLOOD (ROUTINE X 2)
Culture: NO GROWTH
SPECIAL REQUESTS: ADEQUATE

## 2016-08-20 LAB — SEDIMENTATION RATE: Sed Rate: 45 mm/hr — ABNORMAL HIGH (ref 0–16)

## 2016-08-20 LAB — CK: Total CK: 699 U/L — ABNORMAL HIGH (ref 49–397)

## 2016-08-20 LAB — C-REACTIVE PROTEIN: CRP: 9.5 mg/dL — AB (ref <1.0)

## 2016-08-20 MED ORDER — GADOBENATE DIMEGLUMINE 529 MG/ML IV SOLN
15.0000 mL | Freq: Once | INTRAVENOUS | Status: AC | PRN
  Administered 2016-08-20: 15 mL via INTRAVENOUS

## 2016-08-20 MED ORDER — LORAZEPAM 0.5 MG PO TABS
0.5000 mg | ORAL_TABLET | Freq: Two times a day (BID) | ORAL | Status: DC | PRN
  Administered 2016-08-20: 0.5 mg via ORAL
  Filled 2016-08-20: qty 1

## 2016-08-20 NOTE — Progress Notes (Signed)
CSW following to assist with transition to SNF for rehab at [place]. [facility] responded with #[id number]. Met with pt and wife at bedside to discuss bed offers. Chooses [facility] ([facility] selected facility in [place] and confirmed with admissions staff) and chose [facility] as second choice if needed.   Sharren Bridge, MSW, LCSW Clinical Social Work 08/20/2016 [phone]

## 2016-08-20 NOTE — Telephone Encounter (Signed)
Spoke to pts spouse for appts to see NP no studies needed appt date 9/28

## 2016-08-20 NOTE — Care Management Important Message (Signed)
Important Message  Patient Details  Name: Lee [name] MRN: [id number] Date of Birth: 03/22/1950   Medicare Important Message Given:  Yes    Caren MacadamFuller, Reis Pienta 08/20/2016, 12:58 PMImportant Message  Patient Details  Name: Lee [name] MRN: [id number] Date of Birth: 08/30/1950   Medicare Important Message Given:  Yes    Caren MacadamFuller, Atha Mcbain 08/20/2016, 12:58 PM

## 2016-08-20 NOTE — Telephone Encounter (Signed)
-----   Message from Sharee PimpleMarilyn K McChesney, RN sent at 08/19/2016 12:16 PM EDT ----- Regarding: 6-8 weeks with NP    ----- Message ----- From: Maeola Harmanain, Brandon Christopher, MD Sent: 08/19/2016   7:23 AM To: [facility] Charge Pool  F/u in 6-8 weeks with np. abi's performed in hospital.   Apolinar JunesBrandon.

## 2016-08-20 NOTE — Progress Notes (Signed)
PROGRESS NOTE    Lee [name]  ZOX:[id number] DOB: 1950/01/12 DOA: 08/14/2016 PCP: Mirna Mires, MD    Brief Narrative:  Patient is a 66 year old gentleman history of CVA, seizures, PVD, ETOH abuse, COPD presented with injuries after fall noted to be altered mental status. Patient noted to have a fever with a temperature 100.6 workup including urinalysis was worse over UTI. Patient placed empirically on IV antibiotics and admitted.   Assessment & Plan:   Principal Problem:   UTI (urinary tract infection) Active Problems:   Seizures (HCC)   Essential hypertension   Dementia with behavioral disturbance   Leg pain, bilateral   Leg weakness, bilateral  #1 UTI Patient with prior history of UTI with underlying dementia similar presentation admitted to [facility] 3 weeks prior to admission and IVC presented with a fall. Urinalysis done on admission was concerning for UTI. Urine cultures negative. Blood cultures pending with no growth to date. Patient improving clinically. Continue oral Ceftin to complete a course of antibiotic treatment as patient has improved clinically. Follow.  #2 dementia with behavioral disturbance Patient apparently tried to escape while his caregiver was in the restroom and fell with minor injuries. Caregiver concern for altered mental status. Patient likely altered mental status secondary to his dementia in the setting of acute UTI. Patient has been calm. Patient likely close to baseline. Continue Seroquel. Follow.  #3 seizure disorder Stable. No seizures noted. Continue Vimpat, Keppra, carbamazepine.  #4 hypertension with history of peripheral vascular disease Stable. Continue lisinopril, Plavix, aspirin, Lipitor. Patient was seen by vascular surgery who feel patient's lower extremity pain and weakness unlikely to be vascular in nature and recommended outpatient follow-up. Statin on hold secondary to concern for bilateral lower extremity pain  and weakness.  #5 history of CVA Continue Plavix aspirin and Lipitor.  #6 leukocytosis Likely secondary to problem #1. Cultures with no growth to date. Patient has been transitioned to oral Ceftin to complete a course of antibiotic treatment.   #7 bilateral lower extremity pain and weakness Questionable etiology. May be secondary to statin versus inflammatory myositis. Plain films of lower extremities negative for acute fracture. Patient with significant peripheral vascular disease. ABI of 0.51 on the right and 0.62 on the left. Uric acid level decreased. Patient status post 1 dose of IV Solu-Medrol. Patient has been seen in consultation by vascular surgery who feel patient's pain is unlikely to be vascular in nature. Due to history of peripheral vascular disease recommended outpatient follow-up in office. Due to patient's weakness and pain CK level was obtained which was elevated at 965. Patient has been seen in consultation by neurology who feel could be secondary to statin versus inflammatory myositis. CRP was elevated at 9.5. Sedimentation rate obtained was elevated at 45. Statin has been discontinued. Patient states slight improvement with lower extremity pain. MRI of the C-spine pending. Neurology following and appreciate input and recommendations.    DVT prophylaxis: Lovenox Code Status: Full Family Communication: No family at bedside. Disposition Plan: Pending progress. Pending SNF placement.    Consultants:   Vascular surgery: Dr. Randie Heinz 08/18/2016  Neurology : Dr. Amada Jupiter 08/19/2016  Procedures:   CT head, CT C-spine, CT maxillofacial 08/14/2016  Chest x-ray 08/14/2016  Plain films of bilateral lower extremities 08/17/2016  ABIs 08/18/2016  MRI C-spine pending 08/20/2016  Antimicrobials:  IV Rocephin 08/15/2016>>>>> 08/17/2016  Oral Ceftin 08/17/2016   Subjective: Patient alert. Patient states some improvement with muscular lower extremity pain. Tolerating  current diet.  Objective: Vitals:   08/19/16 0609 08/19/16 1500 08/19/16 2156 08/20/16 0627  BP: 130/83 136/77 132/76 (!) 150/80  Pulse: 73 81 79 70  Resp: 16 18 14 14   Temp: 97.8 F (36.6 C) 98 F (36.7 C) 99.3 F (37.4 C) 98.5 F (36.9 C)  TempSrc: Oral Oral Oral Oral  SpO2: 100% 99% 98% 98%  Weight:      Height:        Intake/Output Summary (Last 24 hours) at 08/20/16 1303 Last data filed at 08/20/16 1233  Gross per 24 hour  Intake              600 ml  Output             3200 ml  Net            -2600 ml   Filed Weights   08/14/16 1830 08/15/16 0410  Weight: 71.7 kg (158 lb) 70.9 kg (156 lb 4.8 oz)    Examination:  General exam: Appears calm and comfortable.Upper lip laceration. Respiratory system: Clear to auscultation anterior lung fields. Respiratory effort normal. Cardiovascular system: S1 & S2 heard, RRR. No JVD, murmurs, rubs, gallops or clicks. No pedal edema. Gastrointestinal system: Abdomen is nondistended, soft and some tenderness in suprapubic region. No organomegaly or masses felt. Normal bowel sounds heard. Central nervous system: Alert and oriented. No focal neurological deficits. Extremities: Patient with significant pain whenever bilateral legs are lifted up. 2-3/5 L > R LE strength Skin: No rashes, lesions or ulcers Psychiatry: Judgement and insight appear poor. Mood & affect appropriate.     Data Reviewed: I have personally reviewed following labs and imaging studies  CBC:  Recent Labs Lab 08/14/16 1851 08/15/16 1036 08/16/16 0823 08/17/16 0358 08/18/16 0913  WBC 21.3* 15.3* 14.2* 13.9* 13.7*  NEUTROABS 18.8* 11.2* 11.4*  --  10.5*  HGB 14.0 12.1* 13.3 12.9* 12.9*  HCT 39.9 35.7* 37.2* 37.9* 37.8*  MCV 93.0 93.2 90.3 92.4 92.0  PLT 366 294 225 260 337   Basic Metabolic Panel:  Recent Labs Lab 08/15/16 1036 08/16/16 0823 08/17/16 0358 08/18/16 0913 08/19/16 0342  NA 137 135 133* 134* 134*  K 3.7 3.8 3.7 3.6 4.6  CL 105 104  101 98* 97*  CO2 25 22 22 24 28   GLUCOSE 92 152* 113* 106* 132*  BUN 10 6 5* 7 7  CREATININE 0.79 0.81 0.78 0.78 0.78  CALCIUM 8.2* 8.3* 8.3* 8.5* 8.9   GFR: Estimated Creatinine Clearance: 86.7 mL/min (by C-G formula based on SCr of 0.78 mg/dL). Liver Function Tests:  Recent Labs Lab 08/14/16 1851  AST 30  ALT 17  ALKPHOS 99  BILITOT 0.8  PROT 7.8  ALBUMIN 3.8   No results for input(s): LIPASE, AMYLASE in the last 168 hours. No results for input(s): AMMONIA in the last 168 hours. Coagulation Profile:  Recent Labs Lab 08/15/16 1036  INR 1.18   Cardiac Enzymes:  Recent Labs Lab 08/19/16 0345 08/20/16 0446  CKTOTAL 965* 699*   BNP (last 3 results) No results for input(s): PROBNP in the last 8760 hours. HbA1C: No results for input(s): HGBA1C in the last 72 hours. CBG: No results for input(s): GLUCAP in the last 168 hours. Lipid Profile: No results for input(s): CHOL, HDL, LDLCALC, TRIG, CHOLHDL, LDLDIRECT in the last 72 hours. Thyroid Function Tests: No results for input(s): TSH, T4TOTAL, FREET4, T3FREE, THYROIDAB in the last 72 hours. Anemia Panel: No results for input(s): VITAMINB12, FOLATE, FERRITIN, TIBC, IRON, RETICCTPCT in  the last 72 hours. Sepsis Labs:  Recent Labs Lab 08/14/16 1857 08/14/16 2313 08/15/16 1033 08/15/16 1036 08/15/16 2300  PROCALCITON  --   --   --  0.21  --   LATICACIDVEN 2.37* 1.33 1.1  --  1.5    Recent Results (from the past 240 hour(s))  Blood Culture (routine x 2)     Status: None   Collection Time: 08/14/16  6:52 PM  Result Value Ref Range Status   Specimen Description BLOOD LEFT ANTECUBITAL  Final   Special Requests   Final    BOTTLES DRAWN AEROBIC AND ANAEROBIC Blood Culture adequate volume   Culture   Final    NO GROWTH 5 DAYS Performed at [facility], [address], [place], [place] [address]    Report Status 08/19/2016 FINAL  Final  Urine culture     Status: None   Collection Time: 08/14/16 10:47 PM    Result Value Ref Range Status   Specimen Description URINE, CATHETERIZED  Final   Special Requests Normal  Final   Culture   Final    NO GROWTH Performed at [facility], [address], [place], [place] [address]    Report Status 08/16/2016 FINAL  Final  Blood Culture (routine x 2)     Status: None (Preliminary result)   Collection Time: 08/15/16 10:36 AM  Result Value Ref Range Status   Specimen Description BLOOD LEFT ARM  Final   Special Requests   Final    BOTTLES DRAWN AEROBIC ONLY Blood Culture adequate volume   Culture   Final    NO GROWTH 4 DAYS Performed at [facility], [address], [place], [place] [address]    Report Status PENDING  Incomplete         Radiology Studies: Mr Cervical Spine W Wo Contrast  Result Date: 08/20/2016 CLINICAL DATA:  History of CVA, altered mental status.  Myositis. EXAM: MRI CERVICAL SPINE WITHOUT AND WITH CONTRAST TECHNIQUE: Multiplanar and multiecho pulse sequences of the cervical spine, to include the craniocervical junction and cervicothoracic junction, were obtained without and with intravenous contrast. CONTRAST:  15mL MULTIHANCE GADOBENATE DIMEGLUMINE 529 MG/ML IV SOLN COMPARISON:  None. FINDINGS: Patient motion degrades image quality limiting evaluation. Alignment: Physiologic. Vertebrae: No fracture, evidence of discitis, or bone lesion. Cord: Normal signal and morphology. Posterior Fossa, vertebral arteries, paraspinal tissues: Posterior fossa demonstrates no focal abnormality. Vertebral artery flow voids are maintained. Paraspinal soft tissues are unremarkable. Disc levels: Discs: Degenerative disc disease with disc height loss at T1-2 and T2-3. C2-3: No significant disc bulge. No neural foraminal stenosis. No central canal stenosis. C3-4: No significant disc bulge. Bilateral uncovertebral degenerative changes. No foraminal stenosis. No central canal stenosis. C4-5: No significant disc bulge. No neural foraminal stenosis.  No central canal stenosis. C5-6: Broad central disc protrusion contacting the ventral cervical spinal cord. Left uncovertebral degenerative changes with moderate left foraminal stenosis. No right foraminal stenosis. No central canal stenosis. C6-7: No significant disc bulge. Moderate left foraminal stenosis. No central canal stenosis. C7-T1: No significant disc bulge. Mild left foraminal stenosis. No right foraminal stenosis. No central canal stenosis. T1-2: Mild bilateral foraminal stenosis. No significant disc protrusion or central canal stenosis. IMPRESSION: 1. Mild cervical spine spondylosis as described above. 2. No evidence of myositis involving the cervical paraspinal musculature. Electronically Signed   By: Elige Ko   On: 08/20/2016 09:13        Scheduled Meds: . bacitracin  1  application Topical BID  . carbamazepine  400 mg Oral BID  . cefUROXime  500 mg Oral BID WC  . clopidogrel  75 mg Oral Daily  . docusate sodium  100 mg Oral BID  . enoxaparin (LOVENOX) injection  40 mg Subcutaneous Q24H  . lacosamide  200 mg Oral BID  . levETIRAcetam  1,500 mg Oral BID  . lisinopril  20 mg Oral Daily  . polyethylene glycol  17 g Oral BID  . QUEtiapine  50 mg Oral TID   Continuous Infusions: . sodium chloride 150 mL/hr at 08/20/16 1234     LOS: 4 days    Time spent: 35 minutes    Demiana Crumbley, MD [facility] Pager [phone]  If 7PM-7AM, please contact night-coverage [email or web address] Password [email or web address] 08/20/2016, 1:03 PM

## 2016-08-21 DIAGNOSIS — J449 Chronic obstructive pulmonary disease, unspecified: Secondary | ICD-10-CM | POA: Diagnosis not present

## 2016-08-21 DIAGNOSIS — S01511A Laceration without foreign body of lip, initial encounter: Secondary | ICD-10-CM | POA: Diagnosis not present

## 2016-08-21 DIAGNOSIS — M79662 Pain in left lower leg: Secondary | ICD-10-CM | POA: Diagnosis not present

## 2016-08-21 DIAGNOSIS — R29898 Other symptoms and signs involving the musculoskeletal system: Secondary | ICD-10-CM | POA: Diagnosis not present

## 2016-08-21 DIAGNOSIS — Z880 Allergy status to penicillin: Secondary | ICD-10-CM | POA: Diagnosis not present

## 2016-08-21 DIAGNOSIS — Y92480 Sidewalk as the place of occurrence of the external cause: Secondary | ICD-10-CM | POA: Diagnosis not present

## 2016-08-21 DIAGNOSIS — I10 Essential (primary) hypertension: Secondary | ICD-10-CM | POA: Diagnosis not present

## 2016-08-21 DIAGNOSIS — I700 Atherosclerosis of aorta: Secondary | ICD-10-CM | POA: Diagnosis not present

## 2016-08-21 DIAGNOSIS — R2689 Other abnormalities of gait and mobility: Secondary | ICD-10-CM | POA: Diagnosis not present

## 2016-08-21 DIAGNOSIS — G40409 Other generalized epilepsy and epileptic syndromes, not intractable, without status epilepticus: Secondary | ICD-10-CM | POA: Diagnosis present

## 2016-08-21 DIAGNOSIS — Z7901 Long term (current) use of anticoagulants: Secondary | ICD-10-CM | POA: Diagnosis not present

## 2016-08-21 DIAGNOSIS — Z87891 Personal history of nicotine dependence: Secondary | ICD-10-CM | POA: Diagnosis not present

## 2016-08-21 DIAGNOSIS — Z8249 Family history of ischemic heart disease and other diseases of the circulatory system: Secondary | ICD-10-CM | POA: Diagnosis not present

## 2016-08-21 DIAGNOSIS — M79661 Pain in right lower leg: Secondary | ICD-10-CM | POA: Diagnosis not present

## 2016-08-21 DIAGNOSIS — Z79899 Other long term (current) drug therapy: Secondary | ICD-10-CM | POA: Diagnosis not present

## 2016-08-21 DIAGNOSIS — Z833 Family history of diabetes mellitus: Secondary | ICD-10-CM | POA: Diagnosis not present

## 2016-08-21 DIAGNOSIS — S90822A Blister (nonthermal), left foot, initial encounter: Secondary | ICD-10-CM | POA: Diagnosis not present

## 2016-08-21 DIAGNOSIS — D72829 Elevated white blood cell count, unspecified: Secondary | ICD-10-CM | POA: Diagnosis not present

## 2016-08-21 DIAGNOSIS — Z9114 Patient's other noncompliance with medication regimen: Secondary | ICD-10-CM | POA: Diagnosis not present

## 2016-08-21 DIAGNOSIS — Z8673 Personal history of transient ischemic attack (TIA), and cerebral infarction without residual deficits: Secondary | ICD-10-CM | POA: Diagnosis not present

## 2016-08-21 DIAGNOSIS — Z7902 Long term (current) use of antithrombotics/antiplatelets: Secondary | ICD-10-CM | POA: Diagnosis not present

## 2016-08-21 DIAGNOSIS — Z23 Encounter for immunization: Secondary | ICD-10-CM | POA: Diagnosis not present

## 2016-08-21 DIAGNOSIS — R5381 Other malaise: Secondary | ICD-10-CM | POA: Diagnosis not present

## 2016-08-21 DIAGNOSIS — N390 Urinary tract infection, site not specified: Secondary | ICD-10-CM | POA: Diagnosis not present

## 2016-08-21 DIAGNOSIS — R4182 Altered mental status, unspecified: Secondary | ICD-10-CM | POA: Diagnosis present

## 2016-08-21 DIAGNOSIS — M109 Gout, unspecified: Secondary | ICD-10-CM | POA: Diagnosis not present

## 2016-08-21 DIAGNOSIS — Z91018 Allergy to other foods: Secondary | ICD-10-CM | POA: Diagnosis not present

## 2016-08-21 DIAGNOSIS — F0391 Unspecified dementia with behavioral disturbance: Secondary | ICD-10-CM | POA: Diagnosis not present

## 2016-08-21 DIAGNOSIS — Z885 Allergy status to narcotic agent status: Secondary | ICD-10-CM | POA: Diagnosis not present

## 2016-08-21 DIAGNOSIS — E860 Dehydration: Secondary | ICD-10-CM | POA: Diagnosis not present

## 2016-08-21 DIAGNOSIS — R000 Tachycardia, unspecified: Secondary | ICD-10-CM | POA: Diagnosis not present

## 2016-08-21 DIAGNOSIS — R569 Unspecified convulsions: Secondary | ICD-10-CM | POA: Diagnosis not present

## 2016-08-21 DIAGNOSIS — Z823 Family history of stroke: Secondary | ICD-10-CM | POA: Diagnosis not present

## 2016-08-21 DIAGNOSIS — M6281 Muscle weakness (generalized): Secondary | ICD-10-CM | POA: Diagnosis not present

## 2016-08-21 DIAGNOSIS — R400 Somnolence: Secondary | ICD-10-CM | POA: Diagnosis not present

## 2016-08-21 DIAGNOSIS — W010XXA Fall on same level from slipping, tripping and stumbling without subsequent striking against object, initial encounter: Secondary | ICD-10-CM | POA: Diagnosis not present

## 2016-08-21 DIAGNOSIS — M1990 Unspecified osteoarthritis, unspecified site: Secondary | ICD-10-CM | POA: Diagnosis not present

## 2016-08-21 DIAGNOSIS — N3000 Acute cystitis without hematuria: Secondary | ICD-10-CM | POA: Diagnosis not present

## 2016-08-21 DIAGNOSIS — Y9302 Activity, running: Secondary | ICD-10-CM | POA: Diagnosis not present

## 2016-08-21 DIAGNOSIS — I739 Peripheral vascular disease, unspecified: Secondary | ICD-10-CM | POA: Diagnosis not present

## 2016-08-21 DIAGNOSIS — Z8679 Personal history of other diseases of the circulatory system: Secondary | ICD-10-CM | POA: Diagnosis not present

## 2016-08-21 DIAGNOSIS — Z5189 Encounter for other specified aftercare: Secondary | ICD-10-CM | POA: Diagnosis not present

## 2016-08-21 DIAGNOSIS — Z87442 Personal history of urinary calculi: Secondary | ICD-10-CM | POA: Diagnosis not present

## 2016-08-21 DIAGNOSIS — F0390 Unspecified dementia without behavioral disturbance: Secondary | ICD-10-CM | POA: Diagnosis present

## 2016-08-21 LAB — BASIC METABOLIC PANEL
ANION GAP: 8 (ref 5–15)
BUN: 8 mg/dL (ref 6–20)
CALCIUM: 8.2 mg/dL — AB (ref 8.9–10.3)
CHLORIDE: 103 mmol/L (ref 101–111)
CO2: 23 mmol/L (ref 22–32)
Creatinine, Ser: 0.65 mg/dL (ref 0.61–1.24)
GFR calc non Af Amer: >60 mL/min (ref >60)
GLUCOSE: 95 mg/dL (ref 65–99)
Potassium: 4 mmol/L (ref 3.5–5.1)
Sodium: 134 mmol/L — ABNORMAL LOW (ref 135–145)

## 2016-08-21 LAB — CBC WITH DIFFERENTIAL/PLATELET
Basophils Absolute: 0 10*3/uL (ref 0.0–0.1)
Basophils Relative: 0 %
Eosinophils Absolute: 0.1 10*3/uL (ref 0.0–0.7)
Eosinophils Relative: 1 %
HEMATOCRIT: 34.1 % — AB (ref 39.0–52.0)
HEMOGLOBIN: 11.8 g/dL — AB (ref 13.0–17.0)
LYMPHS ABS: 2 10*3/uL (ref 0.7–4.0)
Lymphocytes Relative: 22 %
MCH: 31.1 pg (ref 26.0–34.0)
MCHC: 34.6 g/dL (ref 30.0–36.0)
MCV: 89.7 fL (ref 78.0–100.0)
MONO ABS: 0.9 10*3/uL (ref 0.1–1.0)
MONOS PCT: 10 %
NEUTROS ABS: 6.1 10*3/uL (ref 1.7–7.7)
NEUTROS PCT: 67 %
Platelets: 351 10*3/uL (ref 150–400)
RBC: 3.8 MIL/uL — ABNORMAL LOW (ref 4.22–5.81)
RDW: 13.8 % (ref 11.5–15.5)
WBC: 9.1 10*3/uL (ref 4.0–10.5)

## 2016-08-21 LAB — CK: Total CK: 484 U/L — ABNORMAL HIGH (ref 49–397)

## 2016-08-21 LAB — MAGNESIUM: Magnesium: 1.6 mg/dL — ABNORMAL LOW (ref 1.7–2.4)

## 2016-08-21 MED ORDER — ONDANSETRON HCL 4 MG PO TABS: 4.0000 mg | ORAL_TABLET | Freq: Four times a day (QID) | ORAL | 0 refills | Status: DC | PRN

## 2016-08-21 MED ORDER — ENOXAPARIN SODIUM 40 MG/0.4ML ~~LOC~~ SOLN: 40.0000 mg | SUBCUTANEOUS | Status: DC

## 2016-08-21 MED ORDER — LORAZEPAM 0.5 MG PO TABS: 0.5000 mg | ORAL_TABLET | Freq: Two times a day (BID) | ORAL | 0 refills | Status: DC | PRN

## 2016-08-21 MED ORDER — POLYETHYLENE GLYCOL 3350 17 G PO PACK: 17.0000 g | PACK | Freq: Two times a day (BID) | ORAL | 0 refills | Status: DC

## 2016-08-21 MED ORDER — DOCUSATE SODIUM 100 MG PO CAPS: 100.0000 mg | ORAL_CAPSULE | Freq: Two times a day (BID) | ORAL | 0 refills | Status: DC

## 2016-08-21 MED ORDER — BACITRACIN 500 UNIT/GM EX OINT: 1.0000 "application " | TOPICAL_OINTMENT | Freq: Two times a day (BID) | CUTANEOUS | 0 refills | Status: DC

## 2016-08-21 MED ORDER — MAGNESIUM SULFATE 4 GM/100ML IV SOLN
4.0000 g | Freq: Once | INTRAVENOUS | Status: AC
  Administered 2016-08-21: 4 g via INTRAVENOUS
  Filled 2016-08-21: qty 100

## 2016-08-21 NOTE — Progress Notes (Signed)
Occupational Therapy Treatment Patient Details Name: Lee [name] MRN: [id number] DOB: Sep 12, 1950 Today's Date: 08/21/2016    History of present illness Patient is a 66 year old gentleman history of CVA, seizures, PVD, ETOH abuse, COPD presented with injuries after fall noted to be altered mental status. Patient noted to have a fever with a temperature 100.6 workup including urinalysis was worse over UTI.   OT comments  Pt still has bil LE pain but able to tolerate sitting EOB today  Follow Up Recommendations  SNF    Equipment Recommendations  None recommended by OT    Recommendations for Other Services      Precautions / Restrictions Precautions Precautions: Fall Restrictions Weight Bearing Restrictions: No       Mobility Bed Mobility         Supine to sit: Total assist;+2 for physical assistance Sit to supine: Max assist;+2 for physical assistance   General bed mobility comments: used pillow to support feet, chux to help sit pt up and scoot forward to EOB  Transfers                 General transfer comment: not attempted    Balance Overall balance assessment: Needs assistance     Sitting balance - Comments: initially max A for trunk support as pt leaned posteriorly; near end of session, min guard for safety                                   ADL either performed or assessed with clinical judgement   ADL Overall ADL's : Needs assistance/impaired Eating/Feeding:  (min guard for beverage)                                     General ADL Comments: sat EOB this session for greater than 10 m,inutes.  initially max A as pt was pushing backwards, but then he sat with min guard.  Initially propped feet with pillow under calves. Eventually, we were able to remove this and allow knees to bend and feet to touch floor     Vision       Perception     Praxis      Cognition Arousal/Alertness: Awake/alert Behavior During  Therapy: WFL for tasks assessed/performed Overall Cognitive Status: Impaired/Different from baseline                   Orientation Level: Place;Time;Situation     Following Commands: Follows one step commands inconsistently (extra time)       General Comments: Pt kept grabbing sheet; had to distract pt to pull sheets down.  Pt tended to pick up on what was said and perseverate on this, not always in correct context        Exercises     Shoulder Instructions       General Comments      Pertinent Vitals/ Pain       Pain Assessment: Faces Faces Pain Scale: Hurts even more Pain Location: bil LEs Pain Descriptors / Indicators: Grimacing;Moaning Pain Intervention(s): Limited activity within patient's tolerance;Monitored during session;Premedicated before session;Repositioned  Home Living  Prior Functioning/Environment              Frequency  Min 2X/week        Progress Toward Goals  OT Goals(current goals can now be found in the care plan section)  Progress towards OT goals: Progressing toward goals  Acute Rehab OT Goals Time For Goal Achievement: 08/24/16  Plan Discharge plan remains appropriate    Co-evaluation    PT/OT/SLP Co-Evaluation/Treatment: Yes     OT goals addressed during session: Strengthening/ROM;ADL's and self-care      AM-PAC PT "6 Clicks" Daily Activity     Outcome Measure   Help from another person eating meals?: A Lot Help from another person taking care of personal grooming?: A Lot Help from another person toileting, which includes using toliet, bedpan, or urinal?: Total Help from another person bathing (including washing, rinsing, drying)?: Total Help from another person to put on and taking off regular upper body clothing?: Total Help from another person to put on and taking off regular lower body clothing?: Total 6 Click Score: 8    End of Session    OT  Visit Diagnosis: Pain Pain - Right/Left: Right (and Left) Pain - part of body: Leg   Activity Tolerance Patient tolerated treatment well   Patient Left in bed;with call bell/phone within reach;with bed alarm set   Nurse Communication          Time: 2951-8841 OT Time Calculation (min): 33 min  Charges: OT General Charges $OT Visit: 1 Procedure OT Treatments $Therapeutic Activity: 8-22 mins  Marica Otter, OTR/L [phone] 08/21/2016   Lee [name] 08/21/2016, 3:57 PM

## 2016-08-21 NOTE — Progress Notes (Signed)
Physical Therapy Treatment Patient Details Name: Lee [name] MRN: [id number] DOB: 01/15/1950 Today's Date: 08/21/2016    History of Present Illness Patient is a 66 year old gentleman history of CVA, seizures, PVD, ETOH abuse, COPD presented with injuries after fall noted to be altered mental status. Patient noted to have a fever with a temperature 100.6 workup including urinalysis was worse over UTI.    PT Comments    The patient complains of foot and leg pain. Gradually tolerated moving patient  To sitting with 2 total assist. Gradually tolerated having knees flexed and feet to floor. Demonstrates left and posterior leaning.  More animated and  Talking more although not  Sensible mostly.  Follow Up Recommendations  SNF;Supervision/Assistance - 24 hour     Equipment Recommendations  None recommended by PT    Recommendations for Other Services       Precautions / Restrictions Precautions Precautions: Fall Precaution Comments: can be agitated and aggressive Restrictions Weight Bearing Restrictions: No    Mobility  Bed Mobility         Supine to sit: Total assist;+2 for physical assistance Sit to supine: Max assist;+2 for physical assistance   General bed mobility comments: used pillow to support feet, chux to help sit pt up and scoot forward to EOB, reversal to return to supine, patient offeringno assist.  Transfers Overall transfer level: Needs assistance               General transfer comment: not attempted  Ambulation/Gait                 Stairs            Wheelchair Mobility    Modified Rankin (Stroke Patients Only)       Balance Overall balance assessment: Needs assistance Sitting-balance support: Bilateral upper extremity supported;Feet supported;Feet unsupported Sitting balance-Leahy Scale: Zero Sitting balance - Comments: initially max A for trunk support as pt leaned posteriorly and to the left. Had patient lean onto right  side which facilitated improved   trunk control. still posterior but held self. near end of session, min guard for safety                                    Cognition Arousal/Alertness: Awake/alert Behavior During Therapy: WFL for tasks assessed/performed Overall Cognitive Status: Impaired/Different from baseline Area of Impairment: Orientation;Memory;Following commands;Awareness                 Orientation Level: Place;Time;Situation     Following Commands: Follows one step commands inconsistently       General Comments: Pt kept grabbing sheet; had to distract pt to pull sheets down.  Pt tended to pick up on what was said and perseverate on this, not always in correct context      Exercises      General Comments        Pertinent Vitals/Pain Pain Assessment: Faces Faces Pain Scale: Hurts even more Pain Location: bil LEs Pain Descriptors / Indicators: Grimacing;Moaning;Guarding Pain Intervention(s): Repositioned;Premedicated before session;Monitored during session    Home Living                      Prior Function            PT Goals (current goals can now be found in the care plan section) Progress towards PT goals: Progressing toward goals    Frequency  Min 3X/week      PT Plan Current plan remains appropriate    Co-evaluation PT/OT/SLP Co-Evaluation/Treatment: Yes Reason for Co-Treatment: For patient/therapist safety PT goals addressed during session: Mobility/safety with mobility OT goals addressed during session: ADL's and self-care      AM-PAC PT "6 Clicks" Daily Activity  Outcome Measure  Difficulty turning over in bed (including adjusting bedclothes, sheets and blankets)?: Total Difficulty moving from lying on back to sitting on the side of the bed? : Total Difficulty sitting down on and standing up from a chair with arms (e.g., wheelchair, bedside commode, etc,.)?: Total Help needed moving to and from a bed to  chair (including a wheelchair)?: Total Help needed walking in hospital room?: Total Help needed climbing 3-5 steps with a railing? : Total 6 Click Score: 6    End of Session   Activity Tolerance: Patient limited by pain Patient left: in bed;with family/visitor present;with bed alarm set Nurse Communication: Mobility status PT Visit Diagnosis: Difficulty in walking, not elsewhere classified (R26.2);History of falling (Z91.81)     Time: 1500-1530 PT Time Calculation (min) (ACUTE ONLY): 30 min  Charges:  $Therapeutic Activity: 8-22 mins                    G CodesBlanchard [name] PT [phone]  Lee [name] 08/21/2016, 4:25 PM

## 2016-08-21 NOTE — Discharge Summary (Signed)
Physician Discharge Summary  NYAIR SAMPAIO ZOX:[id number] DOB: [birth year] DOA: 08/14/2016  PCP: Mirna Mires, MD  Admit date: 08/14/2016 Discharge date: 08/21/2016  Time spent: 60 minutes  Recommendations for Outpatient Follow-up:  1. Follow-up with Dr. Marjory Lies [facility] in 2-3 weeks for further evaluation of patient's bilateral lower extremity weakness and pain. Patient's statin has been held and if no significant improvement may consider muscle biopsy 2. Follow-up with Gates Rigg , vascular surgery 10/05/2016. 3. Follow-up with M.D. at skilled nursing facility. Patient will need a basic metabolic profile done in 1 week to follow-up on electrolytes and renal function.   Discharge Diagnoses:  Principal Problem:   UTI (urinary tract infection) Active Problems:   Seizures (HCC)   Essential hypertension   Dementia with behavioral disturbance   Leg pain, bilateral   Leg weakness, bilateral   Pain   Lip laceration   Discharge Condition: Stable  Diet recommendation: Heart healthy  Filed Weights   08/14/16 1830 08/15/16 0410  Weight: 71.7 kg (158 lb) 70.9 kg (156 lb 4.8 oz)    History of present illness:  Per Dr Juliane Lack is a 66 y.o. male with medical history significant of CVA, seizures, dementia, PVD, COPD, HTN, ETOH abuse, and gout presented with injuries after a fall.  The patient has dementia and is unaccompanied and so was unable to provide meaningful history at this time.  He stated that he was here because of "stuff", is not oriented to place, thinks it is 50, and thinks Ardyth Harps is president.  He denied urinary symptoms and laughs and says "of course" when asked if he wants to be a full code.  HPI per Dr. Clarene Duke: 66yo M w/ PMH including dementia, seizures, peripheral vascular disease, alcohol abuse, COPD, CVA who presents with fall and face injury. Caretaker reported that she was getting ready to take the patient to get up earlier when he ran out  of the house to try to get away. He tripped and fell on the sidewalk, striking his face and sustaining a laceration to his lip. Caretaker reported that he had no loss of consciousness and was not currently on any anticoagulation. He did shatter his dentures when he fell. Of note, she did report the patient had been altered from his baseline on day of admission, she was contacted at his day facility where they noted that his behavior was abnormal. He has not had any vomiting or cough/cold symptoms.  He was previously evaluated in the ER on 7/18 with similar concern for AMS and admitted to geripsych at [facility].  He was also treated for UTI with Keflex at that time, although no urine culture was collected.  Urine culture from 6/6 was positive for >100k colonies of pansensitive E coli.  ED Course: Abrupt change in mental status with temp 100.6, code sepsis initiated and given Vanc/Zosyn/IVF.  UTI was suspected source of infection.  Laceration of upper lip repaired.  Given tetanus booster.  Hospital Course:  #1 UTI Patient with prior history of UTI with underlying dementia similar presentation admitted to [facility] 3 weeks prior to admission and IVC presented with a fall. Urinalysis done on admission was concerning for UTI. Urine cultures negative. Blood cultures with no growth today. Patient was placed empirically on IV Rocephin and followed. Patient improved clinically. Patient was subsequently transitioned to oral Ceftin and complete a full course of antibiotic treatment. No further antibiotics needed.  #2 dementia with behavioral disturbance Patient apparently  tried to escape while his caregiver was in the restroom and fell with minor injuries. Caregiver concern for altered mental status. Patient likely altered mental status secondary to his dementia in the setting of acute UTI. Patient has been calm. Patient likely close to baseline. Continued on home regimen of  Seroquel.   #3 seizure disorder Stable. No seizures noted. Patient was maintained on home regimen of Vimpat, Keppra, carbamazepine.  #4 hypertension with history of peripheral vascular disease Stable. Patient was maintained on lisinopril, Plavix, aspirin. Patient was initially on Lipitor which was subsequently discontinued due to lower extremity pain and weakness.  Patient was seen by vascular surgery who feel patient's lower extremity pain and weakness unlikely to be vascular in nature and recommended outpatient follow-up. Statin on hold secondary to concern for bilateral lower extremity pain and weakness. Outpatient follow-up.  #5 history of CVA Patient was maintained on Plavix and aspirin. Patient was initially maintained on Lipitor however due to significant bilateral lower extremity pain and weakness Lipitor was discontinued. Patient needs to be reassessed and placed on another lipid-lowering medication. May consider Zetia.   #6 leukocytosis Likely secondary to problem #1. Cultures with no growth to date. Patient has been transitioned to oral Ceftin and completed a course of antibiotic treatment.   #7 bilateral lower extremity pain and weakness Questionable etiology. May be secondary to statin versus inflammatory myositis. Plain films of lower extremities negative for acute fracture.  Patient with significant peripheral vascular disease. ABI of 0.51 on the right and 0.62 on the left. Uric acid level decreased.  Patient status post 1 dose of IV Solu-Medrol. Patient has been seen in consultation by vascular surgery who feel patient's pain is unlikely to be vascular in nature. Due to history of peripheral vascular disease recommended outpatient follow-up in office.  Due to patient's weakness and pain, CK level was obtained which was elevated at 965. Patient has been seen in consultation by neurology who feel could be secondary to statin versus inflammatory myositis. CRP was elevated at 9.5.  Sedimentation rate obtained was elevated at 45. Statin has been discontinued. Patient states slight improvement with lower extremity pain. MRI of the C-spine showed mild cervical spine spondylosis, no evidence of myositis involving the cervical paraspinal musculature. Patient was seen by physical therapy. Patient be discharged to skilled nursing facility and to follow-up with his neurologist in 2-3 weeks postdischarge. On follow-up patients lower extremity pain and weakness will need to be reassessed at that time and if patient is still severely affecting a steroid trial to be necessary. Further evaluation may also be done including muscle biopsy  or MRI of the thigh. Outpatient follow-up.    Procedures:  CT head, CT C-spine, CT maxillofacial 08/14/2016  Chest x-ray 08/14/2016  Plain films of bilateral lower extremities 08/17/2016  ABIs 08/18/2016  MRI C-spine 08/20/2016  Consultations:  Vascular surgery: Dr. Randie Heinz 08/18/2016  Neurology : Dr. Amada Jupiter 08/19/2016  Discharge Exam: Vitals:   08/21/16 0440 08/21/16 1403  BP: (!) 159/88 114/72  Pulse: 72 80  Resp: 17 20  Temp: 98.5 F (36.9 C) 98 F (36.7 C)  SpO2: 99% 100%    General: NAD Cardiovascular: RRR Respiratory: CTAB  Discharge Instructions   Discharge Instructions    Diet - low sodium heart healthy    Complete by:  As directed    Increase activity slowly    Complete by:  As directed      Current Discharge Medication List    START taking these medications  Details  bacitracin 500 UNIT/GM ointment Apply 1 application topically 2 (two) times daily. Qty: 15 g, Refills: 0    docusate sodium (COLACE) 100 MG capsule Take 1 capsule (100 mg total) by mouth 2 (two) times daily. Qty: 10 capsule, Refills: 0    enoxaparin (LOVENOX) 40 MG/0.4ML injection Inject 0.4 mLs (40 mg total) into the skin daily. Qty: 0 Syringe    LORazepam (ATIVAN) 0.5 MG tablet Take 1 tablet (0.5 mg total) by mouth 2 (two) times  daily as needed for anxiety. Qty: 20 tablet, Refills: 0    ondansetron (ZOFRAN) 4 MG tablet Take 1 tablet (4 mg total) by mouth every 6 (six) hours as needed for nausea. Qty: 20 tablet, Refills: 0    polyethylene glycol (MIRALAX / GLYCOLAX) packet Take 17 g by mouth 2 (two) times daily. Qty: 14 each, Refills: 0      CONTINUE these medications which have NOT CHANGED   Details  acetaminophen (TYLENOL) 500 MG tablet Take 500 mg by mouth every 6 (six) hours as needed for moderate pain.    aspirin EC 81 MG tablet Take 81 mg by mouth every 4 (four) hours as needed for moderate pain.    carbamazepine (EPITOL) 200 MG tablet Take 2 tablets (400 mg total) by mouth 2 (two) times daily. Qty: 360 tablet, Refills: 3    Cholecalciferol 1000 units tablet Take 1,000 Units by mouth daily.    clopidogrel (PLAVIX) 75 MG tablet Take 1 tablet (75 mg total) by mouth daily. Qty: 90 tablet, Refills: 4    colchicine 0.6 MG tablet Take 1 tablet (0.6 mg total) by mouth 2 (two) times daily.    lacosamide (VIMPAT) 200 MG TABS tablet Take 1 tablet (200 mg total) by mouth 2 (two) times daily. Qty: 60 tablet, Refills: 5    levETIRAcetam (KEPPRA) 750 MG tablet Take 2 tablets (1,500 mg total) by mouth 2 (two) times daily. Qty: 360 tablet, Refills: 4    lisinopril (PRINIVIL,ZESTRIL) 20 MG tablet Take 20 mg by mouth daily.     loratadine (CLARITIN) 10 MG tablet Take 10 mg by mouth daily as needed for allergies. Reported on 07/04/2015    QUEtiapine (SEROQUEL) 50 MG tablet Take 50 mg by mouth 3 (three) times daily. Refills: 0    traZODone (DESYREL) 50 MG tablet Take 50 mg by mouth at bedtime as needed for sleep. Refills: 0      STOP taking these medications     atorvastatin (LIPITOR) 40 MG tablet        Allergies  Allergen Reactions  . Orange Juice [Orange Oil] Diarrhea  . Penicillins Nausea And Vomiting    Tolerates Zosyn. "might have been an overdose" (01/22/2012) Has patient had a PCN reaction  causing immediate rash, facial/tongue/throat swelling, SOB or lightheadedness with hypotension: yes Has patient had a PCN reaction causing severe rash involving mucus membranes or skin necrosis: unknown Has patient had a PCN reaction that required hospitalization: no Has patient had a PCN reaction occurring within the last 10 years: no If all of the above answers are "NO", then may proceed with Cephalosporin u  . Vicodin [Hydrocodone-Acetaminophen] Other (See Comments)    "triggered seizure both here and at home when he tried to take it" (01/22/2012)  . Oxycodone     Causes Seizures    Contact information for follow-up providers    Mirna Mires, MD Follow up in 1 week(s).   Specialty:  Family Medicine Why:  For suture removal Contact information:  [address] [place] [address] [phone]        Suanne Marker, MD. Schedule an appointment as soon as possible for a visit in 2 week(s).   Specialties:  Neurology, Radiology Why:  f/u in 2-3 weeks. Contact information: [address] [place] [address] [phone]        Nickel, Carma Lair, NP Follow up on 10/05/2016.   Specialty:  Vascular Surgery Why:  f/u as scheduled Contact information: [address] [place] [address] [phone]            Contact information for after-discharge care    Destination    [facility] Follow up.   Specialty:  Skilled Nursing Facility Contact information: [address] [place] [address] [phone]                   The results of significant diagnostics from this hospitalization (including imaging, microbiology, ancillary and laboratory) are listed below for reference.    Significant Diagnostic Studies: Dg Chest 2 View  Result Date: 08/14/2016 CLINICAL DATA:  Fever, confusion and dementia EXAM: CHEST  2 VIEW COMPARISON:  07/25/2016 FINDINGS: The heart size and mediastinal contours are within normal limits.  Both lungs are clear. Mild degenerative change along the upper and midthoracic spine. IMPRESSION: No active cardiopulmonary disease. Electronically Signed   By: Tollie Eth M.D.   On: 08/14/2016 20:14   Dg Tibia/fibula Left  Result Date: 08/17/2016 CLINICAL DATA:  Generalized pain from both hips to both ankles due to recent fall. EXAM: LEFT FEMUR 2 VIEWS; RIGHT FEMUR 2 VIEWS; DG HIP (WITH OR WITHOUT PELVIS) 3-4V BILAT; LEFT TIBIA AND FIBULA - 2 VIEW; RIGHT TIBIA AND FIBULA - 2 VIEW COMPARISON:  Left knee x-rays dated November 17, 2013. FINDINGS: Pelvis: No pelvic fracture. Mild degenerative changes of the bilateral hip joints. Radiopaque densities projecting over the left pelvis. Diffuse, severe atherosclerotic vascular calcifications. Large amount stool in the rectum. Left lower extremity: There is no evidence of fracture or other focal bone lesions. Chondrocalcinosis. Mild tricompartmental degenerative changes of the knee. Small knee joint effusion. Extensive atherosclerotic vascular calcifications. Right lower extremity: There is no evidence of fracture or other focal bone lesions. Chondrocalcinosis and tricompartmental degenerative changes at the knee. Small knee joint effusion. IMPRESSION: 1. No fracture of the pelvis or bilateral lower extremities. 2. Degenerative changes and chondrocalcinosis of the bilateral knee joints, unchanged. Electronically Signed   By: Obie Dredge M.D.   On: 08/17/2016 16:01   Dg Tibia/fibula Right  Result Date: 08/17/2016 CLINICAL DATA:  Generalized pain from both hips to both ankles due to recent fall. EXAM: LEFT FEMUR 2 VIEWS; RIGHT FEMUR 2 VIEWS; DG HIP (WITH OR WITHOUT PELVIS) 3-4V BILAT; LEFT TIBIA AND FIBULA - 2 VIEW; RIGHT TIBIA AND FIBULA - 2 VIEW COMPARISON:  Left knee x-rays dated November 17, 2013. FINDINGS: Pelvis: No pelvic fracture. Mild degenerative changes of the bilateral hip joints. Radiopaque densities projecting over the left pelvis. Diffuse, severe  atherosclerotic vascular calcifications. Large amount stool in the rectum. Left lower extremity: There is no evidence of fracture or other focal bone lesions. Chondrocalcinosis. Mild tricompartmental degenerative changes of the knee. Small knee joint effusion. Extensive atherosclerotic vascular calcifications. Right lower extremity: There is no evidence of fracture or other focal bone lesions. Chondrocalcinosis and tricompartmental degenerative changes at the knee. Small knee joint effusion. IMPRESSION: 1. No fracture of the pelvis or bilateral lower extremities. 2. Degenerative changes  and chondrocalcinosis of the bilateral knee joints, unchanged. Electronically Signed   By: Obie Dredge M.D.   On: 08/17/2016 16:01   Ct Head Wo Contrast  Result Date: 08/14/2016 CLINICAL DATA:  66 year old male with history of trauma after falling. Dementia. No loss of consciousness. EXAM: CT HEAD WITHOUT CONTRAST CT MAXILLOFACIAL WITHOUT CONTRAST CT CERVICAL SPINE WITHOUT CONTRAST TECHNIQUE: Multidetector CT imaging of the head, cervical spine, and maxillofacial structures were performed using the standard protocol without intravenous contrast. Multiplanar CT image reconstructions of the cervical spine and maxillofacial structures were also generated. COMPARISON:  None. FINDINGS: CT HEAD FINDINGS Brain: Mild cerebral atrophy. Patchy and confluent areas of decreased attenuation are noted throughout the deep and periventricular white matter of the cerebral hemispheres bilaterally, compatible with chronic microvascular ischemic disease. No evidence of acute infarction, hemorrhage, hydrocephalus, extra-axial collection or mass lesion/mass effect. Vascular: No hyperdense vessel or unexpected calcification. Skull: Normal. Negative for fracture or focal lesion. Other: None. CT MAXILLOFACIAL FINDINGS Osseous: No fracture or mandibular dislocation. No destructive process. Orbits: Negative. No traumatic or inflammatory finding.  Sinuses: Clear. Soft tissues: Negative. CT CERVICAL SPINE FINDINGS Alignment: Normal. Skull base and vertebrae: No acute fracture. No primary bone lesion or focal pathologic process. Soft tissues and spinal canal: No prevertebral fluid or swelling. No visible canal hematoma. Disc levels: Multilevel degenerative disc disease, most pronounced at C5-C6 and C6-C7. Mild multilevel facet arthropathy. Upper chest: Mild emphysematous changes. Other: None. IMPRESSION: 1. No evidence of significant acute traumatic injury to the skull, facial bones, brain or cervical spine. 2. Mild cerebral atrophy with chronic microvascular ischemic changes in the cerebral white matter. 3. Mild multilevel degenerative disc disease and cervical spondylosis, as above. Electronically Signed   By: Trudie Reed M.D.   On: 08/14/2016 20:14   Ct Cervical Spine Wo Contrast  Result Date: 08/14/2016 CLINICAL DATA:  66 year old male with history of trauma after falling. Dementia. No loss of consciousness. EXAM: CT HEAD WITHOUT CONTRAST CT MAXILLOFACIAL WITHOUT CONTRAST CT CERVICAL SPINE WITHOUT CONTRAST TECHNIQUE: Multidetector CT imaging of the head, cervical spine, and maxillofacial structures were performed using the standard protocol without intravenous contrast. Multiplanar CT image reconstructions of the cervical spine and maxillofacial structures were also generated. COMPARISON:  None. FINDINGS: CT HEAD FINDINGS Brain: Mild cerebral atrophy. Patchy and confluent areas of decreased attenuation are noted throughout the deep and periventricular white matter of the cerebral hemispheres bilaterally, compatible with chronic microvascular ischemic disease. No evidence of acute infarction, hemorrhage, hydrocephalus, extra-axial collection or mass lesion/mass effect. Vascular: No hyperdense vessel or unexpected calcification. Skull: Normal. Negative for fracture or focal lesion. Other: None. CT MAXILLOFACIAL FINDINGS Osseous: No fracture or  mandibular dislocation. No destructive process. Orbits: Negative. No traumatic or inflammatory finding. Sinuses: Clear. Soft tissues: Negative. CT CERVICAL SPINE FINDINGS Alignment: Normal. Skull base and vertebrae: No acute fracture. No primary bone lesion or focal pathologic process. Soft tissues and spinal canal: No prevertebral fluid or swelling. No visible canal hematoma. Disc levels: Multilevel degenerative disc disease, most pronounced at C5-C6 and C6-C7. Mild multilevel facet arthropathy. Upper chest: Mild emphysematous changes. Other: None. IMPRESSION: 1. No evidence of significant acute traumatic injury to the skull, facial bones, brain or cervical spine. 2. Mild cerebral atrophy with chronic microvascular ischemic changes in the cerebral white matter. 3. Mild multilevel degenerative disc disease and cervical spondylosis, as above. Electronically Signed   By: Trudie Reed M.D.   On: 08/14/2016 20:14   Mr Cervical Spine W Wo Contrast  Result Date:  08/20/2016 CLINICAL DATA:  History of CVA, altered mental status.  Myositis. EXAM: MRI CERVICAL SPINE WITHOUT AND WITH CONTRAST TECHNIQUE: Multiplanar and multiecho pulse sequences of the cervical spine, to include the craniocervical junction and cervicothoracic junction, were obtained without and with intravenous contrast. CONTRAST:  15mL MULTIHANCE GADOBENATE DIMEGLUMINE 529 MG/ML IV SOLN COMPARISON:  None. FINDINGS: Patient motion degrades image quality limiting evaluation. Alignment: Physiologic. Vertebrae: No fracture, evidence of discitis, or bone lesion. Cord: Normal signal and morphology. Posterior Fossa, vertebral arteries, paraspinal tissues: Posterior fossa demonstrates no focal abnormality. Vertebral artery flow voids are maintained. Paraspinal soft tissues are unremarkable. Disc levels: Discs: Degenerative disc disease with disc height loss at T1-2 and T2-3. C2-3: No significant disc bulge. No neural foraminal stenosis. No central canal  stenosis. C3-4: No significant disc bulge. Bilateral uncovertebral degenerative changes. No foraminal stenosis. No central canal stenosis. C4-5: No significant disc bulge. No neural foraminal stenosis. No central canal stenosis. C5-6: Broad central disc protrusion contacting the ventral cervical spinal cord. Left uncovertebral degenerative changes with moderate left foraminal stenosis. No right foraminal stenosis. No central canal stenosis. C6-7: No significant disc bulge. Moderate left foraminal stenosis. No central canal stenosis. C7-T1: No significant disc bulge. Mild left foraminal stenosis. No right foraminal stenosis. No central canal stenosis. T1-2: Mild bilateral foraminal stenosis. No significant disc protrusion or central canal stenosis. IMPRESSION: 1. Mild cervical spine spondylosis as described above. 2. No evidence of myositis involving the cervical paraspinal musculature. Electronically Signed   By: Elige Ko   On: 08/20/2016 09:13   Dg Chest Port 1 View  Result Date: 07/25/2016 CLINICAL DATA:  Confusion. EXAM: PORTABLE CHEST 1 VIEW COMPARISON:  06/13/2016 FINDINGS: The cardiac silhouette, mediastinal and hilar contours are within normal limits and stable. The lungs are clear. No pleural effusion. The bony thorax is intact. IMPRESSION: No acute cardiopulmonary findings. Electronically Signed   By: Rudie Meyer M.D.   On: 07/25/2016 17:03   Dg Femur Min 2 Views Left  Result Date: 08/17/2016 CLINICAL DATA:  Generalized pain from both hips to both ankles due to recent fall. EXAM: LEFT FEMUR 2 VIEWS; RIGHT FEMUR 2 VIEWS; DG HIP (WITH OR WITHOUT PELVIS) 3-4V BILAT; LEFT TIBIA AND FIBULA - 2 VIEW; RIGHT TIBIA AND FIBULA - 2 VIEW COMPARISON:  Left knee x-rays dated November 17, 2013. FINDINGS: Pelvis: No pelvic fracture. Mild degenerative changes of the bilateral hip joints. Radiopaque densities projecting over the left pelvis. Diffuse, severe atherosclerotic vascular calcifications. Large amount  stool in the rectum. Left lower extremity: There is no evidence of fracture or other focal bone lesions. Chondrocalcinosis. Mild tricompartmental degenerative changes of the knee. Small knee joint effusion. Extensive atherosclerotic vascular calcifications. Right lower extremity: There is no evidence of fracture or other focal bone lesions. Chondrocalcinosis and tricompartmental degenerative changes at the knee. Small knee joint effusion. IMPRESSION: 1. No fracture of the pelvis or bilateral lower extremities. 2. Degenerative changes and chondrocalcinosis of the bilateral knee joints, unchanged. Electronically Signed   By: Obie Dredge M.D.   On: 08/17/2016 16:01   Dg Femur, Min 2 Views Right  Result Date: 08/17/2016 CLINICAL DATA:  Generalized pain from both hips to both ankles due to recent fall. EXAM: LEFT FEMUR 2 VIEWS; RIGHT FEMUR 2 VIEWS; DG HIP (WITH OR WITHOUT PELVIS) 3-4V BILAT; LEFT TIBIA AND FIBULA - 2 VIEW; RIGHT TIBIA AND FIBULA - 2 VIEW COMPARISON:  Left knee x-rays dated November 17, 2013. FINDINGS: Pelvis: No pelvic fracture. Mild degenerative changes of  the bilateral hip joints. Radiopaque densities projecting over the left pelvis. Diffuse, severe atherosclerotic vascular calcifications. Large amount stool in the rectum. Left lower extremity: There is no evidence of fracture or other focal bone lesions. Chondrocalcinosis. Mild tricompartmental degenerative changes of the knee. Small knee joint effusion. Extensive atherosclerotic vascular calcifications. Right lower extremity: There is no evidence of fracture or other focal bone lesions. Chondrocalcinosis and tricompartmental degenerative changes at the knee. Small knee joint effusion. IMPRESSION: 1. No fracture of the pelvis or bilateral lower extremities. 2. Degenerative changes and chondrocalcinosis of the bilateral knee joints, unchanged. Electronically Signed   By: Obie Dredge M.D.   On: 08/17/2016 16:01   Dg Hips Bilat With Pelvis  3-4 Views  Result Date: 08/17/2016 CLINICAL DATA:  Generalized pain from both hips to both ankles due to recent fall. EXAM: LEFT FEMUR 2 VIEWS; RIGHT FEMUR 2 VIEWS; DG HIP (WITH OR WITHOUT PELVIS) 3-4V BILAT; LEFT TIBIA AND FIBULA - 2 VIEW; RIGHT TIBIA AND FIBULA - 2 VIEW COMPARISON:  Left knee x-rays dated November 17, 2013. FINDINGS: Pelvis: No pelvic fracture. Mild degenerative changes of the bilateral hip joints. Radiopaque densities projecting over the left pelvis. Diffuse, severe atherosclerotic vascular calcifications. Large amount stool in the rectum. Left lower extremity: There is no evidence of fracture or other focal bone lesions. Chondrocalcinosis. Mild tricompartmental degenerative changes of the knee. Small knee joint effusion. Extensive atherosclerotic vascular calcifications. Right lower extremity: There is no evidence of fracture or other focal bone lesions. Chondrocalcinosis and tricompartmental degenerative changes at the knee. Small knee joint effusion. IMPRESSION: 1. No fracture of the pelvis or bilateral lower extremities. 2. Degenerative changes and chondrocalcinosis of the bilateral knee joints, unchanged. Electronically Signed   By: Obie Dredge M.D.   On: 08/17/2016 16:01   Ct Maxillofacial Wo Cm  Result Date: 08/14/2016 CLINICAL DATA:  66 year old male with history of trauma after falling. Dementia. No loss of consciousness. EXAM: CT HEAD WITHOUT CONTRAST CT MAXILLOFACIAL WITHOUT CONTRAST CT CERVICAL SPINE WITHOUT CONTRAST TECHNIQUE: Multidetector CT imaging of the head, cervical spine, and maxillofacial structures were performed using the standard protocol without intravenous contrast. Multiplanar CT image reconstructions of the cervical spine and maxillofacial structures were also generated. COMPARISON:  None. FINDINGS: CT HEAD FINDINGS Brain: Mild cerebral atrophy. Patchy and confluent areas of decreased attenuation are noted throughout the deep and periventricular white matter  of the cerebral hemispheres bilaterally, compatible with chronic microvascular ischemic disease. No evidence of acute infarction, hemorrhage, hydrocephalus, extra-axial collection or mass lesion/mass effect. Vascular: No hyperdense vessel or unexpected calcification. Skull: Normal. Negative for fracture or focal lesion. Other: None. CT MAXILLOFACIAL FINDINGS Osseous: No fracture or mandibular dislocation. No destructive process. Orbits: Negative. No traumatic or inflammatory finding. Sinuses: Clear. Soft tissues: Negative. CT CERVICAL SPINE FINDINGS Alignment: Normal. Skull base and vertebrae: No acute fracture. No primary bone lesion or focal pathologic process. Soft tissues and spinal canal: No prevertebral fluid or swelling. No visible canal hematoma. Disc levels: Multilevel degenerative disc disease, most pronounced at C5-C6 and C6-C7. Mild multilevel facet arthropathy. Upper chest: Mild emphysematous changes. Other: None. IMPRESSION: 1. No evidence of significant acute traumatic injury to the skull, facial bones, brain or cervical spine. 2. Mild cerebral atrophy with chronic microvascular ischemic changes in the cerebral white matter. 3. Mild multilevel degenerative disc disease and cervical spondylosis, as above. Electronically Signed   By: Trudie Reed M.D.   On: 08/14/2016 20:14    Microbiology: Recent Results (from the past 240 hour(s))  Blood Culture (routine x 2)     Status: None   Collection Time: 08/14/16  6:52 PM  Result Value Ref Range Status   Specimen Description BLOOD LEFT ANTECUBITAL  Final   Special Requests   Final    BOTTLES DRAWN AEROBIC AND ANAEROBIC Blood Culture adequate volume   Culture   Final    NO GROWTH 5 DAYS Performed at [facility], [address], [place], [place] [address]    Report Status 08/19/2016 FINAL  Final  Urine culture     Status: None   Collection Time: 08/14/16 10:47 PM  Result Value Ref Range Status   Specimen Description URINE,  CATHETERIZED  Final   Special Requests Normal  Final   Culture   Final    NO GROWTH Performed at [facility], [address] [place], [place] [address]    Report Status 08/16/2016 FINAL  Final  Blood Culture (routine x 2)     Status: None   Collection Time: 08/15/16 10:36 AM  Result Value Ref Range Status   Specimen Description BLOOD LEFT ARM  Final   Special Requests   Final    BOTTLES DRAWN AEROBIC ONLY Blood Culture adequate volume   Culture   Final    NO GROWTH 5 DAYS Performed at [facility], [address], [place], [place] [address]    Report Status 08/20/2016 FINAL  Final     Labs: Basic Metabolic Panel:  Recent Labs Lab 08/16/16 0823 08/17/16 0358 08/18/16 0913 08/19/16 0342 08/21/16 0414  NA 135 133* 134* 134* 134*  K 3.8 3.7 3.6 4.6 4.0  CL 104 101 98* 97* 103  CO2 22 22 24 28 23   GLUCOSE 152* 113* 106* 132* 95  BUN 6 5* 7 7 8   CREATININE 0.81 0.78 0.78 0.78 0.65  CALCIUM 8.3* 8.3* 8.5* 8.9 8.2*  MG  --   --   --   --  1.6*   Liver Function Tests:  Recent Labs Lab 08/14/16 1851  AST 30  ALT 17  ALKPHOS 99  BILITOT 0.8  PROT 7.8  ALBUMIN 3.8   No results for input(s): LIPASE, AMYLASE in the last 168 hours. No results for input(s): AMMONIA in the last 168 hours. CBC:  Recent Labs Lab 08/14/16 1851 08/15/16 1036 08/16/16 0823 08/17/16 0358 08/18/16 0913 08/21/16 0414  WBC 21.3* 15.3* 14.2* 13.9* 13.7* 9.1  NEUTROABS 18.8* 11.2* 11.4*  --  10.5* 6.1  HGB 14.0 12.1* 13.3 12.9* 12.9* 11.8*  HCT 39.9 35.7* 37.2* 37.9* 37.8* 34.1*  MCV 93.0 93.2 90.3 92.4 92.0 89.7  PLT 366 294 225 260 337 351   Cardiac Enzymes:  Recent Labs Lab 08/19/16 0345 08/20/16 0446 08/21/16 0414  CKTOTAL 965* 699* 484*   BNP: BNP (last 3 results) No results for input(s): BNP in the last 8760 hours.  ProBNP (last 3 results) No results for input(s): PROBNP in the last 8760 hours.  CBG: No results for input(s): GLUCAP in the last 168  hours.     SignedRamiro Harvest MD.  [facility] 08/21/2016, 3:53 PM

## 2016-08-21 NOTE — Clinical Social Work Placement (Addendum)
Pt discharging to [facility] for [facility] rehab today. Room 706, report [phone]  Will transport via [facility]- CSW completed medical necessity form and arranged for transportation. All information provided to facility via the HUB.  Family notified- wife via phone. States she will meet pt at [facility] this evening.   See below for placement details  CLINICAL SOCIAL WORK PLACEMENT  NOTE  Date:  08/21/2016  Patient Details  Name: Lee [name] MRN: [id number] Date of Birth: 01/16/1950  Clinical Social Work is seeking post-discharge placement for this patient at the Skilled  Nursing Facility level of care (*CSW will initial, date and re-position this form in  chart as items are completed):  Yes   Patient/family provided with [facility] Clinical Social Work Department's list of facilities offering this level of care within the geographic area requested by the patient (or if unable, by the patient's family).  Yes   Patient/family informed of their freedom to choose among providers that offer the needed level of care, that participate in Medicare, Medicaid or managed care program needed by the patient, have an available bed and are willing to accept the patient.  Yes   Patient/family informed of [facility]'s ownership interest in [facility] and [facility], as well as of the fact that they are under no obligation to receive care at these facilities.  PASRR submitted to EDS on 08/16/16     PASRR number received on       Existing PASRR number confirmed on 08/16/16 (lacking dementia dx, updated on 08/16/16)     FL2 transmitted to all facilities in geographic area requested by pt/family on 08/16/16     FL2 transmitted to all facilities within larger geographic area on       Patient informed that his/her managed care company has contracts with or will negotiate with certain facilities, including the following:        Yes   Patient/family informed of bed offers received.  Patient chooses  bed at [facility]     Physician recommends and patient chooses bed at [facility]    Patient to be [facility] on 08/21/16.  Patient to be transferred to facility by [facility]     Patient family notified on 08/21/16 of transfer.  Name of family member notified:  wife [name]     PHYSICIAN Please sign FL2     Additional Comment:    _______________________________________________ Nelwyn SalisburyMeghan R Maury Bamba, LCSW 08/21/2016, 3:32 PM  [phone]

## 2016-08-23 DIAGNOSIS — N390 Urinary tract infection, site not specified: Secondary | ICD-10-CM | POA: Diagnosis not present

## 2016-08-23 DIAGNOSIS — R5381 Other malaise: Secondary | ICD-10-CM | POA: Diagnosis not present

## 2016-08-23 DIAGNOSIS — I739 Peripheral vascular disease, unspecified: Secondary | ICD-10-CM | POA: Diagnosis not present

## 2016-08-27 DIAGNOSIS — S90822A Blister (nonthermal), left foot, initial encounter: Secondary | ICD-10-CM | POA: Diagnosis not present

## 2016-08-29 ENCOUNTER — Encounter (HOSPITAL_COMMUNITY): Payer: Self-pay | Admitting: Emergency Medicine

## 2016-08-29 ENCOUNTER — Inpatient Hospital Stay (HOSPITAL_COMMUNITY)
Admission: EM | Admit: 2016-08-29 | Discharge: 2016-08-31 | DRG: 101 | Disposition: A | Discharge status: Skilled Nursing Facility | Payer: Medicare Other | Attending: Family Medicine | Admitting: Family Medicine

## 2016-08-29 ENCOUNTER — Emergency Department (HOSPITAL_COMMUNITY): Payer: Medicare Other

## 2016-08-29 DIAGNOSIS — M109 Gout, unspecified: Secondary | ICD-10-CM | POA: Diagnosis not present

## 2016-08-29 DIAGNOSIS — Z8673 Personal history of transient ischemic attack (TIA), and cerebral infarction without residual deficits: Secondary | ICD-10-CM | POA: Diagnosis not present

## 2016-08-29 DIAGNOSIS — Z8249 Family history of ischemic heart disease and other diseases of the circulatory system: Secondary | ICD-10-CM | POA: Diagnosis not present

## 2016-08-29 DIAGNOSIS — J449 Chronic obstructive pulmonary disease, unspecified: Secondary | ICD-10-CM | POA: Diagnosis not present

## 2016-08-29 DIAGNOSIS — R000 Tachycardia, unspecified: Secondary | ICD-10-CM | POA: Diagnosis present

## 2016-08-29 DIAGNOSIS — Z880 Allergy status to penicillin: Secondary | ICD-10-CM | POA: Diagnosis not present

## 2016-08-29 DIAGNOSIS — Z87891 Personal history of nicotine dependence: Secondary | ICD-10-CM | POA: Diagnosis not present

## 2016-08-29 DIAGNOSIS — Z833 Family history of diabetes mellitus: Secondary | ICD-10-CM | POA: Diagnosis not present

## 2016-08-29 DIAGNOSIS — Z9114 Patient's other noncompliance with medication regimen: Secondary | ICD-10-CM

## 2016-08-29 DIAGNOSIS — R569 Unspecified convulsions: Secondary | ICD-10-CM | POA: Diagnosis not present

## 2016-08-29 DIAGNOSIS — Z87442 Personal history of urinary calculi: Secondary | ICD-10-CM

## 2016-08-29 DIAGNOSIS — N3000 Acute cystitis without hematuria: Secondary | ICD-10-CM

## 2016-08-29 DIAGNOSIS — Z8679 Personal history of other diseases of the circulatory system: Secondary | ICD-10-CM | POA: Diagnosis not present

## 2016-08-29 DIAGNOSIS — Z79899 Other long term (current) drug therapy: Secondary | ICD-10-CM

## 2016-08-29 DIAGNOSIS — I700 Atherosclerosis of aorta: Secondary | ICD-10-CM | POA: Diagnosis not present

## 2016-08-29 DIAGNOSIS — Z7902 Long term (current) use of antithrombotics/antiplatelets: Secondary | ICD-10-CM | POA: Diagnosis not present

## 2016-08-29 DIAGNOSIS — Z91018 Allergy to other foods: Secondary | ICD-10-CM

## 2016-08-29 DIAGNOSIS — F0390 Unspecified dementia without behavioral disturbance: Secondary | ICD-10-CM | POA: Diagnosis present

## 2016-08-29 DIAGNOSIS — I739 Peripheral vascular disease, unspecified: Secondary | ICD-10-CM | POA: Diagnosis not present

## 2016-08-29 DIAGNOSIS — Z7901 Long term (current) use of anticoagulants: Secondary | ICD-10-CM | POA: Diagnosis not present

## 2016-08-29 DIAGNOSIS — D72829 Elevated white blood cell count, unspecified: Secondary | ICD-10-CM | POA: Diagnosis not present

## 2016-08-29 DIAGNOSIS — G40409 Other generalized epilepsy and epileptic syndromes, not intractable, without status epilepticus: Principal | ICD-10-CM | POA: Diagnosis present

## 2016-08-29 DIAGNOSIS — R400 Somnolence: Secondary | ICD-10-CM

## 2016-08-29 DIAGNOSIS — I10 Essential (primary) hypertension: Secondary | ICD-10-CM | POA: Diagnosis present

## 2016-08-29 DIAGNOSIS — Z885 Allergy status to narcotic agent status: Secondary | ICD-10-CM

## 2016-08-29 DIAGNOSIS — M1990 Unspecified osteoarthritis, unspecified site: Secondary | ICD-10-CM | POA: Diagnosis present

## 2016-08-29 DIAGNOSIS — R4182 Altered mental status, unspecified: Secondary | ICD-10-CM | POA: Diagnosis present

## 2016-08-29 DIAGNOSIS — E860 Dehydration: Secondary | ICD-10-CM | POA: Diagnosis not present

## 2016-08-29 DIAGNOSIS — Z823 Family history of stroke: Secondary | ICD-10-CM | POA: Diagnosis not present

## 2016-08-29 LAB — COMPREHENSIVE METABOLIC PANEL
ALT: 34 U/L (ref 17–63)
AST: 34 U/L (ref 15–41)
Albumin: 3.6 g/dL (ref 3.5–5.0)
Alkaline Phosphatase: 82 U/L (ref 38–126)
Anion gap: 13 (ref 5–15)
BUN: 11 mg/dL (ref 6–20)
CALCIUM: 9.2 mg/dL (ref 8.9–10.3)
CHLORIDE: 102 mmol/L (ref 101–111)
CO2: 20 mmol/L — ABNORMAL LOW (ref 22–32)
CREATININE: 0.94 mg/dL (ref 0.61–1.24)
Glucose, Bld: 110 mg/dL — ABNORMAL HIGH (ref 65–99)
Potassium: 3.8 mmol/L (ref 3.5–5.1)
Sodium: 135 mmol/L (ref 135–145)
TOTAL PROTEIN: 7.6 g/dL (ref 6.5–8.1)
Total Bilirubin: 0.6 mg/dL (ref 0.3–1.2)

## 2016-08-29 LAB — CBC WITH DIFFERENTIAL/PLATELET
Basophils Absolute: 0 10*3/uL (ref 0.0–0.1)
Basophils Relative: 0 %
EOS ABS: 0 10*3/uL (ref 0.0–0.7)
EOS PCT: 0 %
HEMATOCRIT: 37.3 % — AB (ref 39.0–52.0)
HEMOGLOBIN: 13 g/dL (ref 13.0–17.0)
Lymphocytes Relative: 6 %
Lymphs Abs: 1.5 10*3/uL (ref 0.7–4.0)
MCH: 31.5 pg (ref 26.0–34.0)
MCHC: 34.9 g/dL (ref 30.0–36.0)
MCV: 90.3 fL (ref 78.0–100.0)
MONO ABS: 0.5 10*3/uL (ref 0.1–1.0)
Monocytes Relative: 2 %
NEUTROS PCT: 92 %
Neutro Abs: 23.3 10*3/uL — ABNORMAL HIGH (ref 1.7–7.7)
Platelets: 495 10*3/uL — ABNORMAL HIGH (ref 150–400)
RBC: 4.13 MIL/uL — ABNORMAL LOW (ref 4.22–5.81)
RDW: 14.3 % (ref 11.5–15.5)
WBC: 25.3 10*3/uL — AB (ref 4.0–10.5)

## 2016-08-29 LAB — RAPID URINE DRUG SCREEN, HOSP PERFORMED
AMPHETAMINES: NOT DETECTED
BARBITURATES: NOT DETECTED
Benzodiazepines: NOT DETECTED
Cocaine: NOT DETECTED
Opiates: NOT DETECTED
TETRAHYDROCANNABINOL: NOT DETECTED

## 2016-08-29 LAB — URINALYSIS, ROUTINE W REFLEX MICROSCOPIC
Bilirubin Urine: NEGATIVE
GLUCOSE, UA: NEGATIVE mg/dL
Ketones, ur: NEGATIVE mg/dL
NITRITE: NEGATIVE
PH: 5 (ref 5.0–8.0)
Protein, ur: NEGATIVE mg/dL
SPECIFIC GRAVITY, URINE: 1.01 (ref 1.005–1.030)

## 2016-08-29 LAB — CARBAMAZEPINE LEVEL, TOTAL: CARBAMAZEPINE LVL: 2.4 ug/mL — AB (ref 4.0–12.0)

## 2016-08-29 LAB — MRSA PCR SCREENING: MRSA BY PCR: NEGATIVE

## 2016-08-29 LAB — LACTIC ACID, PLASMA: LACTIC ACID, VENOUS: 1.6 mmol/L (ref 0.5–1.9)

## 2016-08-29 MED ORDER — SODIUM CHLORIDE 0.9 % IV SOLN
75.0000 mL/h | INTRAVENOUS | Status: DC
  Administered 2016-08-29: 75 mL/h via INTRAVENOUS

## 2016-08-29 MED ORDER — POLYETHYLENE GLYCOL 3350 17 G PO PACK
17.0000 g | PACK | Freq: Two times a day (BID) | ORAL | Status: DC
Start: 2016-08-29 — End: 2016-08-31
  Administered 2016-08-30 – 2016-08-31 (×3): 17 g via ORAL
  Filled 2016-08-29 (×3): qty 1

## 2016-08-29 MED ORDER — LORAZEPAM 2 MG/ML IJ SOLN
2.0000 mg | Freq: Once | INTRAMUSCULAR | Status: AC
  Administered 2016-08-29: 2 mg via INTRAVENOUS

## 2016-08-29 MED ORDER — DOCUSATE SODIUM 100 MG PO CAPS
100.0000 mg | ORAL_CAPSULE | Freq: Two times a day (BID) | ORAL | Status: DC
  Administered 2016-08-30 – 2016-08-31 (×3): 100 mg via ORAL
  Filled 2016-08-29 (×3): qty 1

## 2016-08-29 MED ORDER — SODIUM CHLORIDE 0.9 % IV SOLN
1500.0000 mg | Freq: Two times a day (BID) | INTRAVENOUS | Status: DC
  Administered 2016-08-29 – 2016-08-30 (×2): 1500 mg via INTRAVENOUS
  Filled 2016-08-29 (×2): qty 15

## 2016-08-29 MED ORDER — PRO-STAT SUGAR FREE PO LIQD
30.0000 mL | Freq: Two times a day (BID) | ORAL | Status: DC
  Administered 2016-08-30 – 2016-08-31 (×3): 30 mL via ORAL
  Filled 2016-08-29 (×3): qty 30

## 2016-08-29 MED ORDER — ONDANSETRON HCL 4 MG PO TABS: 4.0000 mg | ORAL_TABLET | Freq: Four times a day (QID) | ORAL | Status: DC | PRN

## 2016-08-29 MED ORDER — CIPROFLOXACIN IN D5W 400 MG/200ML IV SOLN
400.0000 mg | Freq: Once | INTRAVENOUS | Status: DC
  Administered 2016-08-29: 400 mg via INTRAVENOUS
  Filled 2016-08-29: qty 200

## 2016-08-29 MED ORDER — SODIUM CHLORIDE 0.9 % IV SOLN
INTRAVENOUS | Status: DC
  Administered 2016-08-29: 12:00:00 via INTRAVENOUS

## 2016-08-29 MED ORDER — ACETAMINOPHEN 500 MG PO TABS
500.0000 mg | ORAL_TABLET | Freq: Four times a day (QID) | ORAL | Status: DC | PRN
  Administered 2016-08-30 – 2016-08-31 (×2): 500 mg via ORAL
  Filled 2016-08-29 (×2): qty 1

## 2016-08-29 MED ORDER — LORAZEPAM 2 MG/ML IJ SOLN
INTRAMUSCULAR | Status: AC
Start: 2016-08-29 — End: 2016-08-29
  Administered 2016-08-29: 2 mg via INTRAVENOUS
  Filled 2016-08-29: qty 1

## 2016-08-29 MED ORDER — LORAZEPAM 2 MG/ML IJ SOLN
1.0000 mg | INTRAMUSCULAR | Status: DC | PRN
Start: 2016-08-29 — End: 2016-08-30

## 2016-08-29 MED ORDER — CLOPIDOGREL BISULFATE 75 MG PO TABS
75.0000 mg | ORAL_TABLET | Freq: Every day | ORAL | Status: DC
  Administered 2016-08-30 – 2016-08-31 (×2): 75 mg via ORAL
  Filled 2016-08-29 (×2): qty 1

## 2016-08-29 MED ORDER — CARBAMAZEPINE 200 MG PO TABS
400.0000 mg | ORAL_TABLET | Freq: Two times a day (BID) | ORAL | Status: DC
  Administered 2016-08-30 – 2016-08-31 (×3): 400 mg via ORAL
  Filled 2016-08-29 (×4): qty 2

## 2016-08-29 MED ORDER — ADULT MULTIVITAMIN W/MINERALS CH
1.0000 | ORAL_TABLET | Freq: Every day | ORAL | Status: DC
  Administered 2016-08-30 – 2016-08-31 (×2): 1 via ORAL
  Filled 2016-08-29 (×2): qty 1

## 2016-08-29 MED ORDER — ENOXAPARIN SODIUM 40 MG/0.4ML ~~LOC~~ SOLN
40.0000 mg | SUBCUTANEOUS | Status: DC
  Administered 2016-08-29 – 2016-08-30 (×2): 40 mg via SUBCUTANEOUS
  Filled 2016-08-29 (×2): qty 0.4

## 2016-08-29 MED ORDER — LORATADINE 10 MG PO TABS
10.0000 mg | ORAL_TABLET | Freq: Every day | ORAL | Status: DC
  Administered 2016-08-30 – 2016-08-31 (×2): 10 mg via ORAL
  Filled 2016-08-29 (×2): qty 1

## 2016-08-29 MED ORDER — SODIUM CHLORIDE 0.9 % IV BOLUS (SEPSIS)
500.0000 mL | Freq: Once | INTRAVENOUS | Status: AC
  Administered 2016-08-29: 500 mL via INTRAVENOUS

## 2016-08-29 MED ORDER — LACOSAMIDE 200 MG/20ML IV SOLN
200.0000 mg | Freq: Two times a day (BID) | INTRAVENOUS | Status: DC
  Administered 2016-08-29: 200 mg via INTRAVENOUS
  Filled 2016-08-29 (×2): qty 20

## 2016-08-29 MED ORDER — COLCHICINE 0.6 MG PO TABS
0.6000 mg | ORAL_TABLET | Freq: Two times a day (BID) | ORAL | Status: DC
  Administered 2016-08-30 – 2016-08-31 (×3): 0.6 mg via ORAL
  Filled 2016-08-29 (×3): qty 1

## 2016-08-29 MED ORDER — VITAMIN D3 25 MCG (1000 UNIT) PO TABS
1000.0000 [IU] | ORAL_TABLET | Freq: Every day | ORAL | Status: DC
  Administered 2016-08-30 – 2016-08-31 (×2): 1000 [IU] via ORAL
  Filled 2016-08-29 (×2): qty 1

## 2016-08-29 MED ORDER — LISINOPRIL 20 MG PO TABS
20.0000 mg | ORAL_TABLET | Freq: Every day | ORAL | Status: DC
  Administered 2016-08-30 – 2016-08-31 (×2): 20 mg via ORAL
  Filled 2016-08-29 (×2): qty 1
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
## 2016-08-29 NOTE — ED Notes (Signed)
Bed: WA15 Expected date:  Expected time:  Means of arrival:  Comments: 
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
## 2016-08-29 NOTE — ED Notes (Signed)
Admitting provider at bedside.

## 2016-08-29 NOTE — Progress Notes (Signed)
Consult request has been received. CSW attempting to follow up at present time. CSW attempted assessment but pt did not respond and presented with his eyes closed, as if he could not respond or confirm he understood he was being addresses.  CSW attempted to reach his spouse Pratham Beissel by phone at [phone] and left HIPPA-compliant VM requesting a return call.  Dorothe Pea. Dina Mobley, LCSWA, Darcey Nora, CSI Clinical Social Worker Ph: [phone]

## 2016-08-29 NOTE — ED Notes (Signed)
Call report to April at [phone] at [facility]

## 2016-08-29 NOTE — Progress Notes (Signed)
Received to room 1410 66 YO male eyes closed but moaning no verbal response.

## 2016-08-29 NOTE — ED Triage Notes (Addendum)
Per EMS, pt from [facility] with hx seizures sent to ED for seizures lasting several minutes this morning. EMS reports pt was warm to palpation, diaphoretic, post-ictal, incontinent of urine. Became agitated and began removing oxygen in ambulance en route.  Hx dementia, mental baseline unknown. Currently A & O x 0. Normally gets carbamazepine for seizures, did not get his dose at facility this morning; did not receive any morning medications.

## 2016-08-29 NOTE — ED Notes (Addendum)
Pt had witnessed seizure by Dorene Grebe, primary RN and Deretha Emory, MD. Dorene Grebe administering ativan per verbal order and this writer suctioning for drooling observed. Pt RA oxygen saturation 93%.
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
## 2016-08-30 DIAGNOSIS — Z823 Family history of stroke: Secondary | ICD-10-CM | POA: Diagnosis not present

## 2016-08-30 DIAGNOSIS — R569 Unspecified convulsions: Secondary | ICD-10-CM | POA: Diagnosis not present

## 2016-08-30 DIAGNOSIS — Z8679 Personal history of other diseases of the circulatory system: Secondary | ICD-10-CM | POA: Diagnosis not present

## 2016-08-30 DIAGNOSIS — I10 Essential (primary) hypertension: Secondary | ICD-10-CM | POA: Diagnosis not present

## 2016-08-30 DIAGNOSIS — Z8249 Family history of ischemic heart disease and other diseases of the circulatory system: Secondary | ICD-10-CM | POA: Diagnosis not present

## 2016-08-30 DIAGNOSIS — M109 Gout, unspecified: Secondary | ICD-10-CM | POA: Diagnosis present

## 2016-08-30 DIAGNOSIS — Z833 Family history of diabetes mellitus: Secondary | ICD-10-CM | POA: Diagnosis not present

## 2016-08-30 DIAGNOSIS — R000 Tachycardia, unspecified: Secondary | ICD-10-CM | POA: Diagnosis present

## 2016-08-30 DIAGNOSIS — Z5189 Encounter for other specified aftercare: Secondary | ICD-10-CM | POA: Diagnosis not present

## 2016-08-30 DIAGNOSIS — Z91018 Allergy to other foods: Secondary | ICD-10-CM | POA: Diagnosis not present

## 2016-08-30 DIAGNOSIS — J449 Chronic obstructive pulmonary disease, unspecified: Secondary | ICD-10-CM | POA: Diagnosis present

## 2016-08-30 DIAGNOSIS — D72829 Elevated white blood cell count, unspecified: Secondary | ICD-10-CM | POA: Diagnosis not present

## 2016-08-30 DIAGNOSIS — M79662 Pain in left lower leg: Secondary | ICD-10-CM | POA: Diagnosis not present

## 2016-08-30 DIAGNOSIS — I739 Peripheral vascular disease, unspecified: Secondary | ICD-10-CM | POA: Diagnosis present

## 2016-08-30 DIAGNOSIS — F0390 Unspecified dementia without behavioral disturbance: Secondary | ICD-10-CM | POA: Diagnosis present

## 2016-08-30 DIAGNOSIS — R278 Other lack of coordination: Secondary | ICD-10-CM | POA: Diagnosis not present

## 2016-08-30 DIAGNOSIS — Z87891 Personal history of nicotine dependence: Secondary | ICD-10-CM | POA: Diagnosis not present

## 2016-08-30 DIAGNOSIS — Z87442 Personal history of urinary calculi: Secondary | ICD-10-CM | POA: Diagnosis not present

## 2016-08-30 DIAGNOSIS — R4182 Altered mental status, unspecified: Secondary | ICD-10-CM | POA: Diagnosis present

## 2016-08-30 DIAGNOSIS — Z880 Allergy status to penicillin: Secondary | ICD-10-CM | POA: Diagnosis not present

## 2016-08-30 DIAGNOSIS — Z9114 Patient's other noncompliance with medication regimen: Secondary | ICD-10-CM | POA: Diagnosis not present

## 2016-08-30 DIAGNOSIS — M1990 Unspecified osteoarthritis, unspecified site: Secondary | ICD-10-CM | POA: Diagnosis present

## 2016-08-30 DIAGNOSIS — R2689 Other abnormalities of gait and mobility: Secondary | ICD-10-CM | POA: Diagnosis not present

## 2016-08-30 DIAGNOSIS — Z885 Allergy status to narcotic agent status: Secondary | ICD-10-CM | POA: Diagnosis not present

## 2016-08-30 DIAGNOSIS — M79661 Pain in right lower leg: Secondary | ICD-10-CM | POA: Diagnosis not present

## 2016-08-30 DIAGNOSIS — M6281 Muscle weakness (generalized): Secondary | ICD-10-CM | POA: Diagnosis not present

## 2016-08-30 DIAGNOSIS — Z7902 Long term (current) use of antithrombotics/antiplatelets: Secondary | ICD-10-CM | POA: Diagnosis not present

## 2016-08-30 DIAGNOSIS — Z79899 Other long term (current) drug therapy: Secondary | ICD-10-CM | POA: Diagnosis not present

## 2016-08-30 DIAGNOSIS — Z7901 Long term (current) use of anticoagulants: Secondary | ICD-10-CM | POA: Diagnosis not present

## 2016-08-30 DIAGNOSIS — G40409 Other generalized epilepsy and epileptic syndromes, not intractable, without status epilepticus: Secondary | ICD-10-CM | POA: Diagnosis present

## 2016-08-30 DIAGNOSIS — Z8673 Personal history of transient ischemic attack (TIA), and cerebral infarction without residual deficits: Secondary | ICD-10-CM | POA: Diagnosis not present

## 2016-08-30 DIAGNOSIS — N390 Urinary tract infection, site not specified: Secondary | ICD-10-CM | POA: Diagnosis not present

## 2016-08-30 LAB — CBC
HEMATOCRIT: 34.9 % — AB (ref 39.0–52.0)
HEMOGLOBIN: 11.8 g/dL — AB (ref 13.0–17.0)
MCH: 31.1 pg (ref 26.0–34.0)
MCHC: 33.8 g/dL (ref 30.0–36.0)
MCV: 92.1 fL (ref 78.0–100.0)
Platelets: 486 10*3/uL — ABNORMAL HIGH (ref 150–400)
RBC: 3.79 MIL/uL — AB (ref 4.22–5.81)
RDW: 14.8 % (ref 11.5–15.5)
WBC: 14.9 10*3/uL — ABNORMAL HIGH (ref 4.0–10.5)

## 2016-08-30 LAB — BASIC METABOLIC PANEL
Anion gap: 8 (ref 5–15)
BUN: 15 mg/dL (ref 6–20)
CHLORIDE: 110 mmol/L (ref 101–111)
CO2: 23 mmol/L (ref 22–32)
CREATININE: 1.13 mg/dL (ref 0.61–1.24)
Calcium: 8.8 mg/dL — ABNORMAL LOW (ref 8.9–10.3)
GFR calc non Af Amer: >60 mL/min (ref >60)
Glucose, Bld: 83 mg/dL (ref 65–99)
POTASSIUM: 3.6 mmol/L (ref 3.5–5.1)
SODIUM: 141 mmol/L (ref 135–145)

## 2016-08-30 MED ORDER — LACOSAMIDE 50 MG PO TABS
200.0000 mg | ORAL_TABLET | Freq: Two times a day (BID) | ORAL | Status: DC
  Administered 2016-08-30 – 2016-08-31 (×3): 200 mg via ORAL
  Filled 2016-08-30 (×3): qty 4

## 2016-08-30 MED ORDER — LORAZEPAM 2 MG/ML IJ SOLN: 1.0000 mg | INTRAMUSCULAR | Status: DC | PRN

## 2016-08-30 MED ORDER — LEVETIRACETAM 500 MG PO TABS
1500.0000 mg | ORAL_TABLET | Freq: Two times a day (BID) | ORAL | Status: DC
  Administered 2016-08-30 – 2016-08-31 (×2): 1500 mg via ORAL
  Filled 2016-08-30 (×2): qty 3

## 2016-08-30 NOTE — Progress Notes (Signed)
PROGRESS NOTE    Lee [name]  GYI:[id number] DOB: 02/28/1950 DOA: 08/29/2016 PCP: Mirna Mires, MD   Brief Narrative: Lee [name] is a 66 y.o. male with medical history significant of essential hypertension, seizure disorder, CVA, gout. He presented after having a seizure. On arrival, he had another seizure that was generalized. Antiepileptic drugs were restarted. He continues to have some altered mental status in the setting of dementia but is getting slowly better.   Assessment & Plan:   Principal Problem:   Seizure (HCC) Active Problems:   Essential hypertension   Leukocytosis   History of CVA (cerebrovascular accident)   Somnolence   Post-ictal state (HCC)   Seizure disorder Recurrent seizures likely secondary to patient not receiving his full regimen of antiepileptic drugs. -Continue Vimpat, Keppra, Tegretol -Seizure precautions  Somnolence Secondary to postictal state. Has resolved.  Altered mental status/confusion Patient with underlying dementia which is likely contributing. Infectious workup is still pending. He is improving slowly but is not at baseline per wife  Leukocytosis Likely secondary to seizure. Has improved significantly from yesterday. -Hold antibiotics -Follow-up blood cultures -Follow-up urine culture  History of CVA Lipitor discontinued last admission secondary to extremity pain/weakness -Continue Plavix and Aspirin  Bilateral leg weakness Patient worked up during previous admission. Currently at [facility] for rehab. Patient to follow-up with Dr. Marjory Lies as an outpatient.  Essential hypertension -Continue lisinopril  Dementia Not at baseline per wife as mentioned above  Gout -Continue colchicine   DVT prophylaxis: Lovenox Code Status: Full code Family Communication: Wife at bedside Disposition Plan: Anticipate discharge back to SNF in 24 hours   Consultants:   None  Procedures:    None  Antimicrobials:  Ciprofloxacin (partial dose)    Subjective: Very mildly febrile overnight which improved without Tylenol. Mentation improved overnight as well.  Objective: Vitals:   08/29/16 1900 08/29/16 2322 08/30/16 0434 08/30/16 0902  BP: (!) 148/68 (!) 150/81 131/71 120/70  Pulse: 98 96 85 81  Resp: 18 18 18 16   Temp: 99.5 F (37.5 C) 99.2 F (37.3 C) 98.1 F (36.7 C) 98.5 F (36.9 C)  TempSrc: Axillary Axillary Axillary Oral  SpO2: 94% 96% 95% 100%  Weight: 68.7 kg (151 lb 8 oz)     Height: 5\' 7"  (1.702 m)       Intake/Output Summary (Last 24 hours) at 08/30/16 1224 Last data filed at 08/30/16 0600  Gross per 24 hour  Intake           748.75 ml  Output                0 ml  Net           748.75 ml   Filed Weights   08/29/16 1900  Weight: 68.7 kg (151 lb 8 oz)    Examination:  General exam: Appears calm and comfortable Respiratory system: Clear to auscultation. Respiratory effort normal. Cardiovascular system: S1 & S2 heard, RRR. No murmurs. Gastrointestinal system: Abdomen is nondistended, soft and nontender. Normal bowel sounds heard. Central nervous system: Alert and oriented to person. Follows commands. Extremities: No edema. No calf tenderness Skin: No cyanosis. No rashes Psychiatry: Judgement and insight appear impaired. Mood & affect depressed and flat.     Data Reviewed: I have personally reviewed following labs and imaging studies  CBC:  Recent Labs Lab 08/29/16 1057 08/30/16 0523  WBC 25.3* 14.9*  NEUTROABS 23.3*  --   HGB 13.0 11.8*  HCT 37.3* 34.9*  MCV 90.3 92.1  PLT 495* 486*   Basic Metabolic Panel:  Recent Labs Lab 08/29/16 1057 08/30/16 0523  NA 135 141  K 3.8 3.6  CL 102 110  CO2 20* 23  GLUCOSE 110* 83  BUN 11 15  CREATININE 0.94 1.13  CALCIUM 9.2 8.8*   GFR: Estimated Creatinine Clearance: 59.3 mL/min (by C-G formula based on SCr of 1.13 mg/dL). Liver Function Tests:  Recent Labs Lab  08/29/16 1057  AST 34  ALT 34  ALKPHOS 82  BILITOT 0.6  PROT 7.6  ALBUMIN 3.6   No results for input(s): LIPASE, AMYLASE in the last 168 hours. No results for input(s): AMMONIA in the last 168 hours. Coagulation Profile: No results for input(s): INR, PROTIME in the last 168 hours. Cardiac Enzymes: No results for input(s): CKTOTAL, CKMB, CKMBINDEX, TROPONINI in the last 168 hours. BNP (last 3 results) No results for input(s): PROBNP in the last 8760 hours. HbA1C: No results for input(s): HGBA1C in the last 72 hours. CBG: No results for input(s): GLUCAP in the last 168 hours. Lipid Profile: No results for input(s): CHOL, HDL, LDLCALC, TRIG, CHOLHDL, LDLDIRECT in the last 72 hours. Thyroid Function Tests: No results for input(s): TSH, T4TOTAL, FREET4, T3FREE, THYROIDAB in the last 72 hours. Anemia Panel: No results for input(s): VITAMINB12, FOLATE, FERRITIN, TIBC, IRON, RETICCTPCT in the last 72 hours. Sepsis Labs:  Recent Labs Lab 08/29/16 1250  LATICACIDVEN 1.6    Recent Results (from the past 240 hour(s))  Culture, blood (Routine X 2) w Reflex to ID Panel     Status: None (Preliminary result)   Collection Time: 08/29/16 12:50 PM  Result Value Ref Range Status   Specimen Description BLOOD LEFT HAND  Final   Special Requests   Final    BOTTLES DRAWN AEROBIC AND ANAEROBIC Blood Culture adequate volume   Culture   Final    NO GROWTH < 24 HOURS Performed at [facility], [address], [place], [place] [address]    Report Status PENDING  Incomplete  Culture, blood (Routine X 2) w Reflex to ID Panel     Status: None (Preliminary result)   Collection Time: 08/29/16 12:59 PM  Result Value Ref Range Status   Specimen Description BLOOD RIGHT HAND  Final   Special Requests   Final    BOTTLES DRAWN AEROBIC AND ANAEROBIC Blood Culture adequate volume   Culture   Final    NO GROWTH < 24 HOURS Performed at [facility], [address] [place], [place] [address]     Report Status PENDING  Incomplete  MRSA PCR Screening     Status: None   Collection Time: 08/29/16  7:48 PM  Result Value Ref Range Status   MRSA by PCR NEGATIVE NEGATIVE Final    Comment:        The GeneXpert MRSA Assay (FDA approved for NASAL specimens only), is one component of a comprehensive MRSA colonization surveillance program. It is not intended to diagnose MRSA infection nor to guide or monitor treatment for MRSA infections.          Radiology Studies: Dg Chest 2 View  Result Date: 08/29/2016 CLINICAL DATA:  Seizure this morning.  Altered mental status. EXAM: CHEST  2 VIEW COMPARISON:  08/14/2016 FINDINGS: The heart size and mediastinal contours are within normal limits. Calcification in the thoracic aorta. Both lungs are clear. The visualized skeletal structures are unremarkable. IMPRESSION: No active cardiopulmonary disease. Aortic atherosclerosis. Electronically Signed   By: Fayrene Fearing  Maxwell M.D.   On: 08/29/2016 11:24   Ct Head Wo Contrast  Result Date: 08/29/2016 CLINICAL DATA:  Seizure. EXAM: CT HEAD WITHOUT CONTRAST TECHNIQUE: Contiguous axial images were obtained from the base of the skull through the vertex without intravenous contrast. COMPARISON:  08/14/2016 FINDINGS: Brain: No evidence of acute infarction, hemorrhage, hydrocephalus, extra-axial collection or mass lesion/mass effect. Prominence of the sulci and ventricles identified compatible with brain atrophy. There is low attenuation within the subcortical and periventricular white matter compatible with chronic small vessel ischemic change and brain atrophy. Vascular: No hyperdense vessel or unexpected calcification. Skull: The paranasal sinuses and mastoid air cells are clear. The calvarium is intact. Sinuses/Orbits: No acute finding. Other: None. IMPRESSION: 1. No acute intracranial abnormalities. 2. Chronic small vessel ischemic change and brain atrophy. Electronically Signed   By: Signa Kell M.D.   On:  08/29/2016 11:30        Scheduled Meds: . carbamazepine  400 mg Oral BID  . cholecalciferol  1,000 Units Oral Q breakfast  . clopidogrel  75 mg Oral Daily  . colchicine  0.6 mg Oral BID  . docusate sodium  100 mg Oral BID  . enoxaparin (LOVENOX) injection  40 mg Subcutaneous Q24H  . feeding supplement (PRO-STAT SUGAR FREE 64)  30 mL Oral BID  . lacosamide  200 mg Oral BID  . levETIRAcetam  1,500 mg Oral BID  . lisinopril  20 mg Oral Q breakfast  . loratadine  10 mg Oral Q breakfast  . multivitamin with minerals  1 tablet Oral Daily  . polyethylene glycol  17 g Oral BID   Continuous Infusions: . sodium chloride 75 mL/hr at 08/29/16 1153  . sodium chloride 75 mL/hr (08/29/16 2229)     LOS: 0 days     Jacquelin Hawking, MD [facility] 08/30/2016, 12:24 PM Pager: [phone]  If 7PM-7AM, please contact night-coverage [email or web address] Password [facility] 08/30/2016, 12:24 PM

## 2016-08-30 NOTE — Progress Notes (Signed)
Pt pulled Iv out refuses to allow me to stick for another,combative.MD made aware

## 2016-08-30 NOTE — Progress Notes (Signed)
Combative.Pulling off telemetry.MD paged .Attempted to reorient pty states "do you want me to punch you?"

## 2016-08-30 NOTE — NC FL2 (Signed)
[place] MEDICAID FL2 LEVEL OF CARE SCREENING TOOL     IDENTIFICATION  Patient Name: Lee [name] Birthdate: 21-Feb-1950 Sex: male Admission Date (Current Location): 08/29/2016  [facility] and [place] Number:  Producer, television/film/video and Address:  [facility],  [address], [place] [address]      Provider Number: [id number]  Attending Physician Name and Address:  Narda Bonds, MD  Relative Name and Phone Number:       Current Level of Care: Hospital Recommended Level of Care: Skilled Nursing Facility Prior Approval Number:    Date Approved/Denied:   PASRR Number: [id number] ( expires 09/25/16 )  Discharge Plan: SNF    Current Diagnoses: Patient Active Problem List   Diagnosis Date Noted  . Seizure (HCC) 08/29/2016  . Leukocytosis 08/29/2016  . History of CVA (cerebrovascular accident) 08/29/2016  . Somnolence 08/29/2016  . Post-ictal state (HCC) 08/29/2016  . Lip laceration   . Pain   . Leg weakness, bilateral   . Leg pain, bilateral 08/17/2016  . UTI (urinary tract infection) 08/15/2016  . Aggression 07/26/2016  . Dementia with behavioral disturbance 07/26/2016  . Elevated troponin 12/30/2015  . Chest pain 12/30/2015  . Pneumonia, likely aspiration 11/15/2013  . Status epilepticus (HCC) 11/14/2013  . Essential hypertension 11/14/2013  . Cerebral thrombosis with cerebral infarction (HCC) 11/07/2013  . Seizures (HCC) 11/06/2013  . Chronic airway obstruction, not elsewhere classified 09/22/2013  . Cerebral infarction due to unspecified mechanism 09/22/2013  . Hyperlipidemia 09/18/2013  . Encephalopathy acute 09/11/2013  . Altered mental status 09/11/2013  . CVA (cerebral infarction) 09/11/2013  . Hyponatremia 09/11/2013  . Acute encephalopathy 09/11/2013  . Aftercare following surgery of the circulatory system, NEC 08/25/2012  . Foot swelling 02/04/2012  . Drainage from wound 01/04/2012  . Peripheral vascular disease, unspecified (HCC)  11/05/2011  . Pain in limb 11/05/2011  . 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 PAD (peripheral artery disease) (HCC) 10/28/2011  . TOBACCO ABUSE 02/06/2007  . SYMPTOM, EDEMA 06/13/2006  . ERECTILE DYSFUNCTION 01/25/2006    Orientation RESPIRATION BLADDER Height & Weight     Self  Normal Incontinent Weight: 68.7 kg (151 lb 8 oz) Height:  5\' 7"  (170.2 cm)  BEHAVIORAL SYMPTOMS/MOOD NEUROLOGICAL BOWEL NUTRITION STATUS    Convulsions/Seizures Incontinent Diet (see dc summary)  AMBULATORY STATUS COMMUNICATION OF NEEDS Skin   Extensive Assist Verbally Normal                       Personal Care Assistance Level of Assistance  Bathing, Feeding, Dressing Bathing Assistance: Maximum assistance Feeding assistance: Limited assistance Dressing Assistance: Maximum assistance     Functional Limitations Info  Sight, Hearing, Speech Sight Info: Adequate Hearing Info: Adequate Speech Info: Adequate    SPECIAL CARE FACTORS FREQUENCY  PT (By licensed PT), OT (By licensed OT)     PT Frequency: 5x wk OT Frequency: 5x wk            Contractures Contractures Info: Not present    Additional Factors Info  Code Status Code Status Info: Full Code Allergies Info: Orange Juice Orange Oil, Penicillins, Vicodin Hydrocodone-acetaminophen, Oxycodone Psychotropic Info: see med list         Current Medications (08/30/2016):  This is the current hospital active medication list Current Facility-Administered Medications  Medication Dose Route Frequency Provider Last Rate Last Dose  . 0.9 %  sodium chloride infusion   Intravenous Continuous Vanetta Mulders,  MD 75 mL/hr at 08/29/16 1153    . 0.9 %  sodium chloride infusion  75 mL/hr Intravenous Continuous Narda Bonds, MD 75 mL/hr at 08/29/16 2229 75 mL/hr at 08/29/16 2229  . acetaminophen (TYLENOL) tablet 500 mg  500 mg Oral Q6H PRN Narda Bonds, MD   500 mg at 08/30/16 0907  . carbamazepine  (TEGRETOL) tablet 400 mg  400 mg Oral BID Narda Bonds, MD   400 mg at 08/30/16 9323  . cholecalciferol (VITAMIN D) tablet 1,000 Units  1,000 Units Oral Q breakfast Narda Bonds, MD   1,000 Units at 08/30/16 [phone]  . clopidogrel (PLAVIX) tablet 75 mg  75 mg Oral Daily Narda Bonds, MD   75 mg at 08/30/16 2202  . colchicine tablet 0.6 mg  0.6 mg Oral BID Narda Bonds, MD   0.6 mg at 08/30/16 5427  . docusate sodium (COLACE) capsule 100 mg  100 mg Oral BID Narda Bonds, MD   100 mg at 08/30/16 0907  . enoxaparin (LOVENOX) injection 40 mg  40 mg Subcutaneous Q24H Narda Bonds, MD   40 mg at 08/29/16 2228  . feeding supplement (PRO-STAT SUGAR FREE 64) liquid 30 mL  30 mL Oral BID Narda Bonds, MD   30 mL at 08/30/16 0906  . lacosamide (VIMPAT) tablet 200 mg  200 mg Oral BID Narda Bonds, MD   200 mg at 08/30/16 1023  . levETIRAcetam (KEPPRA) tablet 1,500 mg  1,500 mg Oral BID Narda Bonds, MD      . lisinopril (PRINIVIL,ZESTRIL) tablet 20 mg  20 mg Oral Q breakfast Narda Bonds, MD   20 mg at 08/30/16 0907  . loratadine (CLARITIN) tablet 10 mg  10 mg Oral Q breakfast Narda Bonds, MD   10 mg at 08/30/16 0623  . LORazepam (ATIVAN) injection 1-2 mg  1-2 mg Intravenous Q2H PRN Narda Bonds, MD      . multivitamin with minerals tablet 1 tablet  1 tablet Oral Daily Narda Bonds, MD   1 tablet at 08/30/16 0907  . ondansetron (ZOFRAN) tablet 4 mg  4 mg Oral Q6H PRN Narda Bonds, MD      . polyethylene glycol (MIRALAX / GLYCOLAX) packet 17 g  17 g Oral BID Narda Bonds, MD   17 g at 08/30/16 7628     Discharge Medications: Please see discharge summary for a list of discharge medications.  Relevant Imaging Results:  Relevant Lab Results:   Additional Information SSN:  [id number]  Jenevieve Kirschbaum, Dickey Gave, LCSW

## 2016-08-31 DIAGNOSIS — Z8673 Personal history of transient ischemic attack (TIA), and cerebral infarction without residual deficits: Secondary | ICD-10-CM | POA: Diagnosis not present

## 2016-08-31 DIAGNOSIS — L988 Other specified disorders of the skin and subcutaneous tissue: Secondary | ICD-10-CM | POA: Diagnosis not present

## 2016-08-31 DIAGNOSIS — R2689 Other abnormalities of gait and mobility: Secondary | ICD-10-CM | POA: Diagnosis not present

## 2016-08-31 DIAGNOSIS — R278 Other lack of coordination: Secondary | ICD-10-CM | POA: Diagnosis not present

## 2016-08-31 DIAGNOSIS — W19XXXA Unspecified fall, initial encounter: Secondary | ICD-10-CM | POA: Diagnosis not present

## 2016-08-31 DIAGNOSIS — G40109 Localization-related (focal) (partial) symptomatic epilepsy and epileptic syndromes with simple partial seizures, not intractable, without status epilepticus: Secondary | ICD-10-CM | POA: Diagnosis not present

## 2016-08-31 DIAGNOSIS — M79662 Pain in left lower leg: Secondary | ICD-10-CM | POA: Diagnosis not present

## 2016-08-31 DIAGNOSIS — I10 Essential (primary) hypertension: Secondary | ICD-10-CM | POA: Diagnosis not present

## 2016-08-31 DIAGNOSIS — Z5189 Encounter for other specified aftercare: Secondary | ICD-10-CM | POA: Diagnosis not present

## 2016-08-31 DIAGNOSIS — N390 Urinary tract infection, site not specified: Secondary | ICD-10-CM | POA: Diagnosis not present

## 2016-08-31 DIAGNOSIS — I6930 Unspecified sequelae of cerebral infarction: Secondary | ICD-10-CM | POA: Diagnosis not present

## 2016-08-31 DIAGNOSIS — M6281 Muscle weakness (generalized): Secondary | ICD-10-CM | POA: Diagnosis not present

## 2016-08-31 DIAGNOSIS — D72829 Elevated white blood cell count, unspecified: Secondary | ICD-10-CM | POA: Diagnosis not present

## 2016-08-31 DIAGNOSIS — S90822A Blister (nonthermal), left foot, initial encounter: Secondary | ICD-10-CM | POA: Diagnosis not present

## 2016-08-31 DIAGNOSIS — M79661 Pain in right lower leg: Secondary | ICD-10-CM | POA: Diagnosis not present

## 2016-08-31 DIAGNOSIS — F0391 Unspecified dementia with behavioral disturbance: Secondary | ICD-10-CM | POA: Diagnosis not present

## 2016-08-31 DIAGNOSIS — R569 Unspecified convulsions: Secondary | ICD-10-CM | POA: Diagnosis not present

## 2016-08-31 MED ORDER — ASPIRIN EC 81 MG PO TBEC: 81.0000 mg | DELAYED_RELEASE_TABLET | Freq: Every day | ORAL | Status: DC

## 2016-08-31 NOTE — Discharge Summary (Signed)
Physician Discharge Summary  Lee [name] ZOX:[id number] DOB: 1950-12-03 DOA: 08/29/2016  PCP: Mirna Mires, MD  Admit date: 08/29/2016 Discharge date: 08/31/2016  Admitted From: SNF Disposition: SNF  Recommendations for Outpatient Follow-up:  1. Follow up with SNF physician in 1 week 2. Please continue all three antiepileptic drugs 3. Follow up with Dr. Marjory Lies on 09/05/2016 @ 9:15AM 4. Please obtain CBC in one week to follow up up white blood cell count. 5. Please follow up on the following pending results: Blood/urine cultures  Home Health: SNF Equipment/Devices: None  Discharge Condition: Stable CODE STATUS: Full code Diet recommendation: Heart healthy   Brief/Interim Summary:  Admission HPI  Chief Complaint: Seizure  HPI: Lee [name] is a 66 y.o. male with medical history significant of essential hypertension, seizure disorder, CVA, gout. History unable to be obtained from patient secondary to mental status change in the setting of postictal state. Patient presented from SNF secondary to a generalized seizure. Seizure lasted less than 5 minutes and EMS was called. Per wife, patient was acting normally yesterday. No reports of sneezing, coughing, rhinorrhea, abdominal pain, dysuria. She reports that his last seizure was in April 2018.  ED Course: Vitals: Afebrile. Pulse in 80s to 90s with a few episodes of mild tachycardia to low 100s. Respiratory rate of mid to low 20s. On room air. Blood pressure normotensive. Labs: White blood cell count of 25.3k with an absolute neutrophil count of 23.3kWith evidence of toxic granulation. Units of 495. Lactic acid of 1.6. CO2 is 20.  Imaging: Chest x-ray and significant for acute cardiopulmonary disease Medications/Course: Patient had a seizure lasting less than 5 minutes. He was given Ativan after the seizure ceased. Ciprofloxacin started.    Hospital course:  Seizure disorder Recurrent seizures likely secondary to  patient not receiving his full regimen of antiepileptic drugs. Patient had a seizure on the emergency department. Postictal phase lasted about 12-18 hours. Patient returned to near baseline, however he was slightly more agitated at times. Per wife, he is mostly at baseline. Concerned that the seizures have set him back cognitively. Urine and blood cultures were obtained and so far have remained negative. Seriously doubt infection. Head CT was unremarkable for acute process. Patient is being discharged on Vimpat, Keppra, and Tegretol. He will need to follow-up with neurology; appointment has already been scheduled.   Somnolence Secondary to postictal state. Has resolved.  Altered mental status/confusion Patient with underlying dementia which is likely contributing. Infectious workup is still pending. He is improving slowly but is not at baseline per wife  Leukocytosis Secondary to seizures. Improved. Patient refused further lab draws. Would recommend repeat CBC in a week. Patient received a very small partial dose of ciprofloxacin in the emergency department which was not continued. Urine and blood cultures so far have been negative.  History of CVA Lipitor discontinued last admission secondary to extremity pain/weakness. Continued Plavix and aspirin. Continued to hold Lipitor on discharge.  Bilateral leg weakness Patient worked up during previous admission. Patient to follow-up with Dr. Marjory Lies as an outpatient. Return to SNF for rehabilitation.  Essential hypertension Continued lisinopril.  Dementia Patient waxes and wanes. Per wife, patient appears to be at or near baseline. He is a little more aggressive now, but again, waxes and wanes. Concerned that the seizures may have set him back cognitively. He has follow-up with neurology.  Gout Continued colchicine.  Discharge Diagnoses:  Principal Problem:   Seizure [facility]) Active Problems:   Seizures (HCC)   Essential  hypertension   Leukocytosis   History of CVA (cerebrovascular accident)   Somnolence   Post-ictal state [facility])    Discharge Instructions  Discharge Instructions    Call MD for:  difficulty breathing, headache or visual disturbances    Complete by:  As directed    Call MD for:  persistant dizziness or light-headedness    Complete by:  As directed    Call MD for:  persistant nausea and vomiting    Complete by:  As directed    Call MD for:  temperature >100.4    Complete by:  As directed    Diet - low sodium heart healthy    Complete by:  As directed    Increase activity slowly    Complete by:  As directed      Allergies as of 08/31/2016      Reactions   Orange Juice [orange Oil] Diarrhea   Penicillins Nausea And Vomiting   Tolerates Zosyn. "might have been an overdose" (01/22/2012) Has patient had a PCN reaction causing immediate rash, facial/tongue/throat swelling, SOB or lightheadedness with hypotension: yes Has patient had a PCN reaction causing severe rash involving mucus membranes or skin necrosis: unknown Has patient had a PCN reaction that required hospitalization: no Has patient had a PCN reaction occurring within the last 10 years: no If all of the above answers are "NO", then may proceed with Cephalosporin u   Vicodin [hydrocodone-acetaminophen] Other (See Comments)   "triggered seizure both here and at home when he tried to take it" (01/22/2012)   Oxycodone    Causes Seizures      Medication List    STOP taking these medications   bacitracin 500 UNIT/GM ointment   LORazepam 0.5 MG tablet Commonly known as:  ATIVAN   traZODone 50 MG tablet Commonly known as:  DESYREL     TAKE these medications   acetaminophen 500 MG tablet Commonly known as:  TYLENOL Take 500 mg by mouth every 6 (six) hours as needed for moderate pain.   aspirin EC 81 MG tablet Take 1 tablet (81 mg total) by mouth daily. What changed:  when to take this  reasons to take this    carbamazepine 200 MG tablet Commonly known as:  EPITOL Take 2 tablets (400 mg total) by mouth 2 (two) times daily.   cholecalciferol 1000 units tablet Commonly known as:  VITAMIN D Take 1,000 Units by mouth daily with breakfast.   clopidogrel 75 MG tablet Commonly known as:  PLAVIX Take 1 tablet (75 mg total) by mouth daily.   colchicine 0.6 MG tablet Take 1 tablet (0.6 mg total) by mouth 2 (two) times daily.   DECUBI-VITE Caps Take 1 capsule by mouth daily.   docusate sodium 100 MG capsule Commonly known as:  COLACE Take 1 capsule (100 mg total) by mouth 2 (two) times daily.   enoxaparin 40 MG/0.4ML injection Commonly known as:  LOVENOX Inject 0.4 mLs (40 mg total) into the skin daily.   feeding supplement (PRO-STAT SUGAR FREE 64) Liqd Take 30 mLs by mouth 2 (two) times daily.   lacosamide 200 MG Tabs tablet Commonly known as:  VIMPAT Take 1 tablet (200 mg total) by mouth 2 (two) times daily.   levETIRAcetam 750 MG tablet Commonly known as:  KEPPRA Take 2 tablets (1,500 mg total) by mouth 2 (two) times daily.   lisinopril 20 MG tablet Commonly known as:  PRINIVIL,ZESTRIL Take 20 mg by mouth daily with breakfast.   loratadine 10  MG tablet Commonly known as:  CLARITIN Take 10 mg by mouth daily with breakfast.   ondansetron 4 MG tablet Commonly known as:  ZOFRAN Take 1 tablet (4 mg total) by mouth every 6 (six) hours as needed for nausea.   polyethylene glycol packet Commonly known as:  MIRALAX / GLYCOLAX Take 17 g by mouth 2 (two) times daily.            Discharge Care Instructions        Start     Ordered   08/31/16 0000  Increase activity slowly     08/31/16 1151   08/31/16 0000  Diet - low sodium heart healthy     08/31/16 1151   08/31/16 0000  Call MD for:  difficulty breathing, headache or visual disturbances     08/31/16 1151   08/31/16 0000  Call MD for:  persistant dizziness or light-headedness     08/31/16 1151   08/31/16 0000  Call MD  for:  persistant nausea and vomiting     08/31/16 1151   08/31/16 0000  Call MD for:  temperature >100.4     08/31/16 1151   08/31/16 0000  aspirin EC 81 MG tablet  Daily     08/31/16 1151      Contact information for follow-up providers    Suanne Marker, MD. Go on 09/05/2016.   Specialties:  Neurology, Radiology Why:  9:15AM Contact information: [address] [place] [address] [phone]            Contact information for after-discharge care    Destination    [facility] Follow up.   Specialty:  Skilled Nursing Facility Contact information: [address] [place] [address] [phone]                 Allergies  Allergen Reactions  . Orange Juice [Orange Oil] Diarrhea  . Penicillins Nausea And Vomiting    Tolerates Zosyn. "might have been an overdose" (01/22/2012) Has patient had a PCN reaction causing immediate rash, facial/tongue/throat swelling, SOB or lightheadedness with hypotension: yes Has patient had a PCN reaction causing severe rash involving mucus membranes or skin necrosis: unknown Has patient had a PCN reaction that required hospitalization: no Has patient had a PCN reaction occurring within the last 10 years: no If all of the above answers are "NO", then may proceed with Cephalosporin u  . Vicodin [Hydrocodone-Acetaminophen] Other (See Comments)    "triggered seizure both here and at home when he tried to take it" (01/22/2012)  . Oxycodone     Causes Seizures    Consultations:  None   Procedures/Studies: Dg Chest 2 View  Result Date: 08/29/2016 CLINICAL DATA:  Seizure this morning.  Altered mental status. EXAM: CHEST  2 VIEW COMPARISON:  08/14/2016 FINDINGS: The heart size and mediastinal contours are within normal limits. Calcification in the thoracic aorta. Both lungs are clear. The visualized skeletal structures are unremarkable. IMPRESSION: No active cardiopulmonary disease. Aortic  atherosclerosis. Electronically Signed   By: Francene Boyers M.D.   On: 08/29/2016 11:24   Dg Chest 2 View  Result Date: 08/14/2016 CLINICAL DATA:  Fever, confusion and dementia EXAM: CHEST  2 VIEW COMPARISON:  07/25/2016 FINDINGS: The heart size and mediastinal contours are within normal limits. Both lungs are clear. Mild degenerative change along the upper and midthoracic spine. IMPRESSION: No active cardiopulmonary disease. Electronically Signed   By: Tollie Eth M.D.   On: 08/14/2016 20:14  Dg Tibia/fibula Left  Result Date: 08/17/2016 CLINICAL DATA:  Generalized pain from both hips to both ankles due to recent fall. EXAM: LEFT FEMUR 2 VIEWS; RIGHT FEMUR 2 VIEWS; DG HIP (WITH OR WITHOUT PELVIS) 3-4V BILAT; LEFT TIBIA AND FIBULA - 2 VIEW; RIGHT TIBIA AND FIBULA - 2 VIEW COMPARISON:  Left knee x-rays dated November 17, 2013. FINDINGS: Pelvis: No pelvic fracture. Mild degenerative changes of the bilateral hip joints. Radiopaque densities projecting over the left pelvis. Diffuse, severe atherosclerotic vascular calcifications. Large amount stool in the rectum. Left lower extremity: There is no evidence of fracture or other focal bone lesions. Chondrocalcinosis. Mild tricompartmental degenerative changes of the knee. Small knee joint effusion. Extensive atherosclerotic vascular calcifications. Right lower extremity: There is no evidence of fracture or other focal bone lesions. Chondrocalcinosis and tricompartmental degenerative changes at the knee. Small knee joint effusion. IMPRESSION: 1. No fracture of the pelvis or bilateral lower extremities. 2. Degenerative changes and chondrocalcinosis of the bilateral knee joints, unchanged. Electronically Signed   By: Obie Dredge M.D.   On: 08/17/2016 16:01   Dg Tibia/fibula Right  Result Date: 08/17/2016 CLINICAL DATA:  Generalized pain from both hips to both ankles due to recent fall. EXAM: LEFT FEMUR 2 VIEWS; RIGHT FEMUR 2 VIEWS; DG HIP (WITH OR WITHOUT  PELVIS) 3-4V BILAT; LEFT TIBIA AND FIBULA - 2 VIEW; RIGHT TIBIA AND FIBULA - 2 VIEW COMPARISON:  Left knee x-rays dated November 17, 2013. FINDINGS: Pelvis: No pelvic fracture. Mild degenerative changes of the bilateral hip joints. Radiopaque densities projecting over the left pelvis. Diffuse, severe atherosclerotic vascular calcifications. Large amount stool in the rectum. Left lower extremity: There is no evidence of fracture or other focal bone lesions. Chondrocalcinosis. Mild tricompartmental degenerative changes of the knee. Small knee joint effusion. Extensive atherosclerotic vascular calcifications. Right lower extremity: There is no evidence of fracture or other focal bone lesions. Chondrocalcinosis and tricompartmental degenerative changes at the knee. Small knee joint effusion. IMPRESSION: 1. No fracture of the pelvis or bilateral lower extremities. 2. Degenerative changes and chondrocalcinosis of the bilateral knee joints, unchanged. Electronically Signed   By: Obie Dredge M.D.   On: 08/17/2016 16:01   Ct Head Wo Contrast  Result Date: 08/29/2016 CLINICAL DATA:  Seizure. EXAM: CT HEAD WITHOUT CONTRAST TECHNIQUE: Contiguous axial images were obtained from the base of the skull through the vertex without intravenous contrast. COMPARISON:  08/14/2016 FINDINGS: Brain: No evidence of acute infarction, hemorrhage, hydrocephalus, extra-axial collection or mass lesion/mass effect. Prominence of the sulci and ventricles identified compatible with brain atrophy. There is low attenuation within the subcortical and periventricular white matter compatible with chronic small vessel ischemic change and brain atrophy. Vascular: No hyperdense vessel or unexpected calcification. Skull: The paranasal sinuses and mastoid air cells are clear. The calvarium is intact. Sinuses/Orbits: No acute finding. Other: None. IMPRESSION: 1. No acute intracranial abnormalities. 2. Chronic small vessel ischemic change and brain  atrophy. Electronically Signed   By: Signa Kell M.D.   On: 08/29/2016 11:30   Ct Head Wo Contrast  Result Date: 08/14/2016 CLINICAL DATA:  66 year old male with history of trauma after falling. Dementia. No loss of consciousness. EXAM: CT HEAD WITHOUT CONTRAST CT MAXILLOFACIAL WITHOUT CONTRAST CT CERVICAL SPINE WITHOUT CONTRAST TECHNIQUE: Multidetector CT imaging of the head, cervical spine, and maxillofacial structures were performed using the standard protocol without intravenous contrast. Multiplanar CT image reconstructions of the cervical spine and maxillofacial structures were also generated. COMPARISON:  None. FINDINGS: CT HEAD FINDINGS Brain:  Mild cerebral atrophy. Patchy and confluent areas of decreased attenuation are noted throughout the deep and periventricular white matter of the cerebral hemispheres bilaterally, compatible with chronic microvascular ischemic disease. No evidence of acute infarction, hemorrhage, hydrocephalus, extra-axial collection or mass lesion/mass effect. Vascular: No hyperdense vessel or unexpected calcification. Skull: Normal. Negative for fracture or focal lesion. Other: None. CT MAXILLOFACIAL FINDINGS Osseous: No fracture or mandibular dislocation. No destructive process. Orbits: Negative. No traumatic or inflammatory finding. Sinuses: Clear. Soft tissues: Negative. CT CERVICAL SPINE FINDINGS Alignment: Normal. Skull base and vertebrae: No acute fracture. No primary bone lesion or focal pathologic process. Soft tissues and spinal canal: No prevertebral fluid or swelling. No visible canal hematoma. Disc levels: Multilevel degenerative disc disease, most pronounced at C5-C6 and C6-C7. Mild multilevel facet arthropathy. Upper chest: Mild emphysematous changes. Other: None. IMPRESSION: 1. No evidence of significant acute traumatic injury to the skull, facial bones, brain or cervical spine. 2. Mild cerebral atrophy with chronic microvascular ischemic changes in the cerebral  white matter. 3. Mild multilevel degenerative disc disease and cervical spondylosis, as above. Electronically Signed   By: Trudie Reed M.D.   On: 08/14/2016 20:14   Ct Cervical Spine Wo Contrast  Result Date: 08/14/2016 CLINICAL DATA:  66 year old male with history of trauma after falling. Dementia. No loss of consciousness. EXAM: CT HEAD WITHOUT CONTRAST CT MAXILLOFACIAL WITHOUT CONTRAST CT CERVICAL SPINE WITHOUT CONTRAST TECHNIQUE: Multidetector CT imaging of the head, cervical spine, and maxillofacial structures were performed using the standard protocol without intravenous contrast. Multiplanar CT image reconstructions of the cervical spine and maxillofacial structures were also generated. COMPARISON:  None. FINDINGS: CT HEAD FINDINGS Brain: Mild cerebral atrophy. Patchy and confluent areas of decreased attenuation are noted throughout the deep and periventricular white matter of the cerebral hemispheres bilaterally, compatible with chronic microvascular ischemic disease. No evidence of acute infarction, hemorrhage, hydrocephalus, extra-axial collection or mass lesion/mass effect. Vascular: No hyperdense vessel or unexpected calcification. Skull: Normal. Negative for fracture or focal lesion. Other: None. CT MAXILLOFACIAL FINDINGS Osseous: No fracture or mandibular dislocation. No destructive process. Orbits: Negative. No traumatic or inflammatory finding. Sinuses: Clear. Soft tissues: Negative. CT CERVICAL SPINE FINDINGS Alignment: Normal. Skull base and vertebrae: No acute fracture. No primary bone lesion or focal pathologic process. Soft tissues and spinal canal: No prevertebral fluid or swelling. No visible canal hematoma. Disc levels: Multilevel degenerative disc disease, most pronounced at C5-C6 and C6-C7. Mild multilevel facet arthropathy. Upper chest: Mild emphysematous changes. Other: None. IMPRESSION: 1. No evidence of significant acute traumatic injury to the skull, facial bones, brain or  cervical spine. 2. Mild cerebral atrophy with chronic microvascular ischemic changes in the cerebral white matter. 3. Mild multilevel degenerative disc disease and cervical spondylosis, as above. Electronically Signed   By: Trudie Reed M.D.   On: 08/14/2016 20:14   Mr Cervical Spine W Wo Contrast  Result Date: 08/20/2016 CLINICAL DATA:  History of CVA, altered mental status.  Myositis. EXAM: MRI CERVICAL SPINE WITHOUT AND WITH CONTRAST TECHNIQUE: Multiplanar and multiecho pulse sequences of the cervical spine, to include the craniocervical junction and cervicothoracic junction, were obtained without and with intravenous contrast. CONTRAST:  15mL MULTIHANCE GADOBENATE DIMEGLUMINE 529 MG/ML IV SOLN COMPARISON:  None. FINDINGS: Patient motion degrades image quality limiting evaluation. Alignment: Physiologic. Vertebrae: No fracture, evidence of discitis, or bone lesion. Cord: Normal signal and morphology. Posterior Fossa, vertebral arteries, paraspinal tissues: Posterior fossa demonstrates no focal abnormality. Vertebral artery flow voids are maintained. Paraspinal soft tissues are unremarkable.  Disc levels: Discs: Degenerative disc disease with disc height loss at T1-2 and T2-3. C2-3: No significant disc bulge. No neural foraminal stenosis. No central canal stenosis. C3-4: No significant disc bulge. Bilateral uncovertebral degenerative changes. No foraminal stenosis. No central canal stenosis. C4-5: No significant disc bulge. No neural foraminal stenosis. No central canal stenosis. C5-6: Broad central disc protrusion contacting the ventral cervical spinal cord. Left uncovertebral degenerative changes with moderate left foraminal stenosis. No right foraminal stenosis. No central canal stenosis. C6-7: No significant disc bulge. Moderate left foraminal stenosis. No central canal stenosis. C7-T1: No significant disc bulge. Mild left foraminal stenosis. No right foraminal stenosis. No central canal stenosis. T1-2:  Mild bilateral foraminal stenosis. No significant disc protrusion or central canal stenosis. IMPRESSION: 1. Mild cervical spine spondylosis as described above. 2. No evidence of myositis involving the cervical paraspinal musculature. Electronically Signed   By: Elige Ko   On: 08/20/2016 09:13   Dg Femur Min 2 Views Left  Result Date: 08/17/2016 CLINICAL DATA:  Generalized pain from both hips to both ankles due to recent fall. EXAM: LEFT FEMUR 2 VIEWS; RIGHT FEMUR 2 VIEWS; DG HIP (WITH OR WITHOUT PELVIS) 3-4V BILAT; LEFT TIBIA AND FIBULA - 2 VIEW; RIGHT TIBIA AND FIBULA - 2 VIEW COMPARISON:  Left knee x-rays dated November 17, 2013. FINDINGS: Pelvis: No pelvic fracture. Mild degenerative changes of the bilateral hip joints. Radiopaque densities projecting over the left pelvis. Diffuse, severe atherosclerotic vascular calcifications. Large amount stool in the rectum. Left lower extremity: There is no evidence of fracture or other focal bone lesions. Chondrocalcinosis. Mild tricompartmental degenerative changes of the knee. Small knee joint effusion. Extensive atherosclerotic vascular calcifications. Right lower extremity: There is no evidence of fracture or other focal bone lesions. Chondrocalcinosis and tricompartmental degenerative changes at the knee. Small knee joint effusion. IMPRESSION: 1. No fracture of the pelvis or bilateral lower extremities. 2. Degenerative changes and chondrocalcinosis of the bilateral knee joints, unchanged. Electronically Signed   By: Obie Dredge M.D.   On: 08/17/2016 16:01   Dg Femur, Min 2 Views Right  Result Date: 08/17/2016 CLINICAL DATA:  Generalized pain from both hips to both ankles due to recent fall. EXAM: LEFT FEMUR 2 VIEWS; RIGHT FEMUR 2 VIEWS; DG HIP (WITH OR WITHOUT PELVIS) 3-4V BILAT; LEFT TIBIA AND FIBULA - 2 VIEW; RIGHT TIBIA AND FIBULA - 2 VIEW COMPARISON:  Left knee x-rays dated November 17, 2013. FINDINGS: Pelvis: No pelvic fracture. Mild degenerative  changes of the bilateral hip joints. Radiopaque densities projecting over the left pelvis. Diffuse, severe atherosclerotic vascular calcifications. Large amount stool in the rectum. Left lower extremity: There is no evidence of fracture or other focal bone lesions. Chondrocalcinosis. Mild tricompartmental degenerative changes of the knee. Small knee joint effusion. Extensive atherosclerotic vascular calcifications. Right lower extremity: There is no evidence of fracture or other focal bone lesions. Chondrocalcinosis and tricompartmental degenerative changes at the knee. Small knee joint effusion. IMPRESSION: 1. No fracture of the pelvis or bilateral lower extremities. 2. Degenerative changes and chondrocalcinosis of the bilateral knee joints, unchanged. Electronically Signed   By: Obie Dredge M.D.   On: 08/17/2016 16:01   Dg Hips Bilat With Pelvis 3-4 Views  Result Date: 08/17/2016 CLINICAL DATA:  Generalized pain from both hips to both ankles due to recent fall. EXAM: LEFT FEMUR 2 VIEWS; RIGHT FEMUR 2 VIEWS; DG HIP (WITH OR WITHOUT PELVIS) 3-4V BILAT; LEFT TIBIA AND FIBULA - 2 VIEW; RIGHT TIBIA AND FIBULA - 2 VIEW  COMPARISON:  Left knee x-rays dated November 17, 2013. FINDINGS: Pelvis: No pelvic fracture. Mild degenerative changes of the bilateral hip joints. Radiopaque densities projecting over the left pelvis. Diffuse, severe atherosclerotic vascular calcifications. Large amount stool in the rectum. Left lower extremity: There is no evidence of fracture or other focal bone lesions. Chondrocalcinosis. Mild tricompartmental degenerative changes of the knee. Small knee joint effusion. Extensive atherosclerotic vascular calcifications. Right lower extremity: There is no evidence of fracture or other focal bone lesions. Chondrocalcinosis and tricompartmental degenerative changes at the knee. Small knee joint effusion. IMPRESSION: 1. No fracture of the pelvis or bilateral lower extremities. 2. Degenerative  changes and chondrocalcinosis of the bilateral knee joints, unchanged. Electronically Signed   By: Obie Dredge M.D.   On: 08/17/2016 16:01   Ct Maxillofacial Wo Cm  Result Date: 08/14/2016 CLINICAL DATA:  66 year old male with history of trauma after falling. Dementia. No loss of consciousness. EXAM: CT HEAD WITHOUT CONTRAST CT MAXILLOFACIAL WITHOUT CONTRAST CT CERVICAL SPINE WITHOUT CONTRAST TECHNIQUE: Multidetector CT imaging of the head, cervical spine, and maxillofacial structures were performed using the standard protocol without intravenous contrast. Multiplanar CT image reconstructions of the cervical spine and maxillofacial structures were also generated. COMPARISON:  None. FINDINGS: CT HEAD FINDINGS Brain: Mild cerebral atrophy. Patchy and confluent areas of decreased attenuation are noted throughout the deep and periventricular white matter of the cerebral hemispheres bilaterally, compatible with chronic microvascular ischemic disease. No evidence of acute infarction, hemorrhage, hydrocephalus, extra-axial collection or mass lesion/mass effect. Vascular: No hyperdense vessel or unexpected calcification. Skull: Normal. Negative for fracture or focal lesion. Other: None. CT MAXILLOFACIAL FINDINGS Osseous: No fracture or mandibular dislocation. No destructive process. Orbits: Negative. No traumatic or inflammatory finding. Sinuses: Clear. Soft tissues: Negative. CT CERVICAL SPINE FINDINGS Alignment: Normal. Skull base and vertebrae: No acute fracture. No primary bone lesion or focal pathologic process. Soft tissues and spinal canal: No prevertebral fluid or swelling. No visible canal hematoma. Disc levels: Multilevel degenerative disc disease, most pronounced at C5-C6 and C6-C7. Mild multilevel facet arthropathy. Upper chest: Mild emphysematous changes. Other: None. IMPRESSION: 1. No evidence of significant acute traumatic injury to the skull, facial bones, brain or cervical spine. 2. Mild cerebral  atrophy with chronic microvascular ischemic changes in the cerebral white matter. 3. Mild multilevel degenerative disc disease and cervical spondylosis, as above. Electronically Signed   By: Trudie Reed M.D.   On: 08/14/2016 20:14     Subjective: Afebrile overnight. No seizures.  Discharge Exam: Vitals:   08/30/16 2346 08/31/16 0413  BP: 137/77 (!) 144/70  Pulse: 70 68  Resp: 17 18  Temp: 97.8 F (36.6 C) 97.8 F (36.6 C)  SpO2: 100% 100%   Vitals:   08/30/16 1444 08/30/16 2144 08/30/16 2346 08/31/16 0413  BP: 136/72 (!) 152/80 137/77 (!) 144/70  Pulse: 88 92 70 68  Resp: 18 16 17 18   Temp: 98.6 F (37 C) 97.6 F (36.4 C) 97.8 F (36.6 C) 97.8 F (36.6 C)  TempSrc: Axillary Oral Oral Oral  SpO2: 96% 97% 100% 100%  Weight:      Height:        General: Pt is alert, awake, not in acute distress Cardiovascular: RRR, S1/S2 +, no rubs, no gallops Respiratory: CTA bilaterally, no wheezing, no rhonchi Abdominal: Soft, NT, ND, bowel sounds + Extremities: no edema, no cyanosis Neuro: alert, oriented to person. Not oriented to place or time. Psych: flat affect.     The results of significant diagnostics from  this hospitalization (including imaging, microbiology, ancillary and laboratory) are listed below for reference.     Microbiology: Recent Results (from the past 240 hour(s))  Culture, blood (Routine X 2) w Reflex to ID Panel     Status: None (Preliminary result)   Collection Time: 08/29/16 12:50 PM  Result Value Ref Range Status   Specimen Description BLOOD LEFT HAND  Final   Special Requests   Final    BOTTLES DRAWN AEROBIC AND ANAEROBIC Blood Culture adequate volume   Culture   Final    NO GROWTH < 24 HOURS Performed at [facility], [address] [place], [place] [address]    Report Status PENDING  Incomplete  Culture, blood (Routine X 2) w Reflex to ID Panel     Status: None (Preliminary result)   Collection Time: 08/29/16 12:59 PM  Result Value  Ref Range Status   Specimen Description BLOOD RIGHT HAND  Final   Special Requests   Final    BOTTLES DRAWN AEROBIC AND ANAEROBIC Blood Culture adequate volume   Culture   Final    NO GROWTH < 24 HOURS Performed at [facility], [address], [place], [place] [address]    Report Status PENDING  Incomplete  MRSA PCR Screening     Status: None   Collection Time: 08/29/16  7:48 PM  Result Value Ref Range Status   MRSA by PCR NEGATIVE NEGATIVE Final    Comment:        The GeneXpert MRSA Assay (FDA approved for NASAL specimens only), is one component of a comprehensive MRSA colonization surveillance program. It is not intended to diagnose MRSA infection nor to guide or monitor treatment for MRSA infections.      Labs: BNP (last 3 results) No results for input(s): BNP in the last 8760 hours. Basic Metabolic Panel:  Recent Labs Lab 08/29/16 1057 08/30/16 0523  NA 135 141  K 3.8 3.6  CL 102 110  CO2 20* 23  GLUCOSE 110* 83  BUN 11 15  CREATININE 0.94 1.13  CALCIUM 9.2 8.8*   Liver Function Tests:  Recent Labs Lab 08/29/16 1057  AST 34  ALT 34  ALKPHOS 82  BILITOT 0.6  PROT 7.6  ALBUMIN 3.6   No results for input(s): LIPASE, AMYLASE in the last 168 hours. No results for input(s): AMMONIA in the last 168 hours. CBC:  Recent Labs Lab 08/29/16 1057 08/30/16 0523  WBC 25.3* 14.9*  NEUTROABS 23.3*  --   HGB 13.0 11.8*  HCT 37.3* 34.9*  MCV 90.3 92.1  PLT 495* 486*   Cardiac Enzymes: No results for input(s): CKTOTAL, CKMB, CKMBINDEX, TROPONINI in the last 168 hours. BNP: Invalid input(s): POCBNP CBG: No results for input(s): GLUCAP in the last 168 hours. D-Dimer No results for input(s): DDIMER in the last 72 hours. Hgb A1c No results for input(s): HGBA1C in the last 72 hours. Lipid Profile No results for input(s): CHOL, HDL, LDLCALC, TRIG, CHOLHDL, LDLDIRECT in the last 72 hours. Thyroid function studies No results for input(s): TSH,  T4TOTAL, T3FREE, THYROIDAB in the last 72 hours.  Invalid input(s): FREET3 Anemia work up No results for input(s): VITAMINB12, FOLATE, FERRITIN, TIBC, IRON, RETICCTPCT in the last 72 hours. Urinalysis    Component Value Date/Time   COLORURINE YELLOW 08/29/2016 1346   APPEARANCEUR TURBID (A) 08/29/2016 1346   LABSPEC 1.010 08/29/2016 1346   PHURINE 5.0 08/29/2016 1346   GLUCOSEU NEGATIVE 08/29/2016 1346   HGBUR SMALL (A)  08/29/2016 1346   BILIRUBINUR NEGATIVE 08/29/2016 1346   KETONESUR NEGATIVE 08/29/2016 1346   PROTEINUR NEGATIVE 08/29/2016 1346   UROBILINOGEN 0.2 03/22/2014 1206   NITRITE NEGATIVE 08/29/2016 1346   LEUKOCYTESUR SMALL (A) 08/29/2016 1346   Sepsis Labs Invalid input(s): PROCALCITONIN,  WBC,  LACTICIDVEN Microbiology Recent Results (from the past 240 hour(s))  Culture, blood (Routine X 2) w Reflex to ID Panel     Status: None (Preliminary result)   Collection Time: 08/29/16 12:50 PM  Result Value Ref Range Status   Specimen Description BLOOD LEFT HAND  Final   Special Requests   Final    BOTTLES DRAWN AEROBIC AND ANAEROBIC Blood Culture adequate volume   Culture   Final    NO GROWTH < 24 HOURS Performed at [facility], [address] [place], [place] [address]    Report Status PENDING  Incomplete  Culture, blood (Routine X 2) w Reflex to ID Panel     Status: None (Preliminary result)   Collection Time: 08/29/16 12:59 PM  Result Value Ref Range Status   Specimen Description BLOOD RIGHT HAND  Final   Special Requests   Final    BOTTLES DRAWN AEROBIC AND ANAEROBIC Blood Culture adequate volume   Culture   Final    NO GROWTH < 24 HOURS Performed at [facility], [address], [place], [place] [address]    Report Status PENDING  Incomplete  MRSA PCR Screening     Status: None   Collection Time: 08/29/16  7:48 PM  Result Value Ref Range Status   MRSA by PCR NEGATIVE NEGATIVE Final    Comment:        The GeneXpert MRSA Assay (FDA approved  for NASAL specimens only), is one component of a comprehensive MRSA colonization surveillance program. It is not intended to diagnose MRSA infection nor to guide or monitor treatment for MRSA infections.      Time coordinating discharge: Over 30 minutes  SIGNED:   Jacquelin Hawking, MD [facility] 08/31/2016, 11:51 AM Pager [phone]  If 7PM-7AM, please contact night-coverage [email or web address] Password [email or web address]

## 2016-08-31 NOTE — Progress Notes (Signed)
Pt is ready to return to [facility] today. Pt / spouse are in agreement with dc plan. [facility] transport is required. Medical necessity form completed. DC Summary sent to SNF for review. Scripts included in [employer]. # for report provided to nsg.  Cori Razor LCSW [phone]

## 2016-08-31 NOTE — Progress Notes (Signed)
Will d/c back to facility today

## 2016-08-31 NOTE — Progress Notes (Signed)
Patient refused lab draws this am.  Notified PCP

## 2016-08-31 NOTE — Progress Notes (Signed)
Nutrition Brief Note  Consult received from yesterday at [facility] for assessment of nutrition requirements/status.   Wt Readings from Last 15 Encounters:  08/29/16 151 lb 8 oz (68.7 kg)  08/15/16 156 lb 4.8 oz (70.9 kg)  06/25/16 153 lb 12.8 oz (69.8 kg)  06/13/16 160 lb (72.6 kg)  02/01/16 160 lb (72.6 kg)  01/01/16 153 lb 4.8 oz (69.5 kg)  07/04/15 158 lb 12.8 oz (72 kg)  11/08/14 164 lb 12.8 oz (74.8 kg)  05/05/14 174 lb 3.2 oz (79 kg)  02/03/14 162 lb 12.8 oz (73.8 kg)  11/14/13 159 lb 6.3 oz (72.3 kg)  11/06/13 163 lb 1.6 oz (74 kg)  10/20/13 175 lb (79.4 kg)  10/07/13 175 lb 3.2 oz (79.5 kg)  09/18/13 177 lb 6.4 oz (80.5 kg)    Body mass index is 23.73 kg/m. Patient meets criteria for normal weight based on current BMI. Skin WDL. Pt admitted on 8/22 from SNF d/t seizure. He has hx of HTN, CVA, seizure disorder, gout. Pt is alert and oriented to self only. Discharge order and discharge summary in place from ~30 minutes ago with plan to return to SNF.   Current diet order is Heart Healthy with no documented intakes. Pt was previously admitted 87-8/14 and consumed 100% for all meals documented during that time. Labs and medications reviewed.   If pt unable to d/c, please re-consult RD over the weekend.     Trenton Gammon, MS, RD, LDN, [facility] Inpatient Clinical Dietitian Pager # [phone] After hours/weekend pager # [phone]

## 2016-08-31 NOTE — Progress Notes (Signed)
D/c to SNF via [facility] NAD noted

## 2016-08-31 NOTE — Progress Notes (Signed)
Report called to [facility] SNF spoke to [facility] LPN

## 2016-09-01 LAB — URINE CULTURE

## 2016-09-03 DIAGNOSIS — W19XXXA Unspecified fall, initial encounter: Secondary | ICD-10-CM | POA: Diagnosis not present

## 2016-09-03 DIAGNOSIS — S90822A Blister (nonthermal), left foot, initial encounter: Secondary | ICD-10-CM | POA: Diagnosis not present

## 2016-09-03 DIAGNOSIS — D72829 Elevated white blood cell count, unspecified: Secondary | ICD-10-CM | POA: Diagnosis not present

## 2016-09-03 LAB — CULTURE, BLOOD (ROUTINE X 2)
CULTURE: NO GROWTH
Culture: NO GROWTH
Special Requests: ADEQUATE
Special Requests: ADEQUATE

## 2016-09-05 ENCOUNTER — Encounter: Payer: Self-pay | Admitting: Diagnostic Neuroimaging

## 2016-09-05 ENCOUNTER — Ambulatory Visit (INDEPENDENT_AMBULATORY_CARE_PROVIDER_SITE_OTHER): Payer: Medicare Other | Admitting: Diagnostic Neuroimaging

## 2016-09-05 VITALS — BP 109/64 | HR 73

## 2016-09-05 DIAGNOSIS — I6930 Unspecified sequelae of cerebral infarction: Secondary | ICD-10-CM

## 2016-09-05 DIAGNOSIS — R569 Unspecified convulsions: Secondary | ICD-10-CM

## 2016-09-05 DIAGNOSIS — F0391 Unspecified dementia with behavioral disturbance: Secondary | ICD-10-CM

## 2016-09-05 DIAGNOSIS — G40109 Localization-related (focal) (partial) symptomatic epilepsy and epileptic syndromes with simple partial seizures, not intractable, without status epilepticus: Secondary | ICD-10-CM | POA: Diagnosis not present

## 2016-09-05 DIAGNOSIS — F03B18 Unspecified dementia, moderate, with other behavioral disturbance: Secondary | ICD-10-CM
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
## 2016-09-10 DIAGNOSIS — S90822A Blister (nonthermal), left foot, initial encounter: Secondary | ICD-10-CM | POA: Diagnosis not present

## 2016-09-17 DIAGNOSIS — L988 Other specified disorders of the skin and subcutaneous tissue: Secondary | ICD-10-CM | POA: Diagnosis not present

## 2016-10-04 DIAGNOSIS — W19XXXA Unspecified fall, initial encounter: Secondary | ICD-10-CM | POA: Diagnosis not present

## 2016-10-04 DIAGNOSIS — R410 Disorientation, unspecified: Secondary | ICD-10-CM | POA: Diagnosis not present

## 2016-10-05 ENCOUNTER — Ambulatory Visit: Payer: Medicare Other | Admitting: Family

## 2016-10-05 ENCOUNTER — Ambulatory Visit: Payer: Self-pay | Admitting: Vascular Surgery

## 2016-10-09 ENCOUNTER — Ambulatory Visit: Payer: Self-pay | Admitting: Adult Health

## 2016-10-10 ENCOUNTER — Encounter: Payer: Self-pay | Admitting: Adult Health

## 2016-10-15 DIAGNOSIS — I739 Peripheral vascular disease, unspecified: Secondary | ICD-10-CM | POA: Diagnosis not present

## 2016-10-15 DIAGNOSIS — L89620 Pressure ulcer of left heel, unstageable: Secondary | ICD-10-CM | POA: Diagnosis not present

## 2016-10-16 DIAGNOSIS — I739 Peripheral vascular disease, unspecified: Secondary | ICD-10-CM | POA: Diagnosis not present

## 2016-10-17 DIAGNOSIS — I739 Peripheral vascular disease, unspecified: Secondary | ICD-10-CM | POA: Diagnosis not present

## 2016-10-18 DIAGNOSIS — I739 Peripheral vascular disease, unspecified: Secondary | ICD-10-CM | POA: Diagnosis not present

## 2016-10-19 DIAGNOSIS — I739 Peripheral vascular disease, unspecified: Secondary | ICD-10-CM | POA: Diagnosis not present

## 2016-10-22 DIAGNOSIS — I739 Peripheral vascular disease, unspecified: Secondary | ICD-10-CM | POA: Diagnosis not present

## 2016-10-23 DIAGNOSIS — I739 Peripheral vascular disease, unspecified: Secondary | ICD-10-CM | POA: Diagnosis not present

## 2016-10-24 DIAGNOSIS — N390 Urinary tract infection, site not specified: Secondary | ICD-10-CM | POA: Diagnosis not present

## 2016-10-24 DIAGNOSIS — I739 Peripheral vascular disease, unspecified: Secondary | ICD-10-CM | POA: Diagnosis not present

## 2016-10-25 DIAGNOSIS — I739 Peripheral vascular disease, unspecified: Secondary | ICD-10-CM | POA: Diagnosis not present

## 2016-10-26 DIAGNOSIS — I739 Peripheral vascular disease, unspecified: Secondary | ICD-10-CM | POA: Diagnosis not present

## 2016-10-27 DIAGNOSIS — I739 Peripheral vascular disease, unspecified: Secondary | ICD-10-CM | POA: Diagnosis not present

## 2016-10-29 DIAGNOSIS — I739 Peripheral vascular disease, unspecified: Secondary | ICD-10-CM | POA: Diagnosis not present

## 2016-10-29 DIAGNOSIS — N390 Urinary tract infection, site not specified: Secondary | ICD-10-CM | POA: Diagnosis not present

## 2016-10-29 DIAGNOSIS — L89620 Pressure ulcer of left heel, unstageable: Secondary | ICD-10-CM | POA: Diagnosis not present

## 2016-11-01 DIAGNOSIS — R5381 Other malaise: Secondary | ICD-10-CM | POA: Diagnosis not present

## 2016-11-01 DIAGNOSIS — W19XXXA Unspecified fall, initial encounter: Secondary | ICD-10-CM | POA: Diagnosis not present

## 2016-11-01 DIAGNOSIS — N390 Urinary tract infection, site not specified: Secondary | ICD-10-CM | POA: Diagnosis not present

## 2016-11-05 DIAGNOSIS — L89620 Pressure ulcer of left heel, unstageable: Secondary | ICD-10-CM | POA: Diagnosis not present

## 2016-11-19 DIAGNOSIS — L89620 Pressure ulcer of left heel, unstageable: Secondary | ICD-10-CM | POA: Diagnosis not present

## 2016-11-21 DIAGNOSIS — Z79899 Other long term (current) drug therapy: Secondary | ICD-10-CM | POA: Diagnosis not present

## 2016-11-21 DIAGNOSIS — Z0001 Encounter for general adult medical examination with abnormal findings: Secondary | ICD-10-CM | POA: Diagnosis not present

## 2016-11-26 DIAGNOSIS — L89620 Pressure ulcer of left heel, unstageable: Secondary | ICD-10-CM | POA: Diagnosis not present

## 2016-11-26 DIAGNOSIS — I739 Peripheral vascular disease, unspecified: Secondary | ICD-10-CM | POA: Diagnosis not present

## 2016-11-26 DIAGNOSIS — J449 Chronic obstructive pulmonary disease, unspecified: Secondary | ICD-10-CM | POA: Diagnosis not present

## 2016-11-27 DIAGNOSIS — D509 Iron deficiency anemia, unspecified: Secondary | ICD-10-CM | POA: Diagnosis not present

## 2016-11-27 DIAGNOSIS — I10 Essential (primary) hypertension: Secondary | ICD-10-CM | POA: Diagnosis not present

## 2016-12-04 DIAGNOSIS — L89620 Pressure ulcer of left heel, unstageable: Secondary | ICD-10-CM | POA: Diagnosis not present

## 2016-12-07 DIAGNOSIS — L89620 Pressure ulcer of left heel, unstageable: Secondary | ICD-10-CM | POA: Diagnosis not present

## 2016-12-21 DIAGNOSIS — L89620 Pressure ulcer of left heel, unstageable: Secondary | ICD-10-CM | POA: Diagnosis not present

## 2016-12-24 DIAGNOSIS — L89620 Pressure ulcer of left heel, unstageable: Secondary | ICD-10-CM | POA: Diagnosis not present

## 2016-12-25 DIAGNOSIS — R488 Other symbolic dysfunctions: Secondary | ICD-10-CM | POA: Diagnosis not present

## 2016-12-25 DIAGNOSIS — M6281 Muscle weakness (generalized): Secondary | ICD-10-CM | POA: Diagnosis not present

## 2016-12-26 DIAGNOSIS — L89620 Pressure ulcer of left heel, unstageable: Secondary | ICD-10-CM | POA: Diagnosis not present

## 2016-12-26 DIAGNOSIS — M6281 Muscle weakness (generalized): Secondary | ICD-10-CM | POA: Diagnosis not present

## 2016-12-26 DIAGNOSIS — R488 Other symbolic dysfunctions: Secondary | ICD-10-CM | POA: Diagnosis not present

## 2017-01-02 DIAGNOSIS — R488 Other symbolic dysfunctions: Secondary | ICD-10-CM | POA: Diagnosis not present

## 2017-01-02 DIAGNOSIS — M6281 Muscle weakness (generalized): Secondary | ICD-10-CM | POA: Diagnosis not present

## 2017-01-03 DIAGNOSIS — M6281 Muscle weakness (generalized): Secondary | ICD-10-CM | POA: Diagnosis not present

## 2017-01-03 DIAGNOSIS — R488 Other symbolic dysfunctions: Secondary | ICD-10-CM | POA: Diagnosis not present

## 2017-01-04 DIAGNOSIS — R488 Other symbolic dysfunctions: Secondary | ICD-10-CM | POA: Diagnosis not present

## 2017-01-04 DIAGNOSIS — M6281 Muscle weakness (generalized): Secondary | ICD-10-CM | POA: Diagnosis not present

## 2017-01-07 DIAGNOSIS — M6281 Muscle weakness (generalized): Secondary | ICD-10-CM | POA: Diagnosis not present

## 2017-01-07 DIAGNOSIS — R488 Other symbolic dysfunctions: Secondary | ICD-10-CM | POA: Diagnosis not present

## 2017-01-08 DIAGNOSIS — M6281 Muscle weakness (generalized): Secondary | ICD-10-CM | POA: Diagnosis not present

## 2017-01-08 DIAGNOSIS — R488 Other symbolic dysfunctions: Secondary | ICD-10-CM | POA: Diagnosis not present

## 2017-01-09 DIAGNOSIS — R488 Other symbolic dysfunctions: Secondary | ICD-10-CM | POA: Diagnosis not present

## 2017-01-09 DIAGNOSIS — M6281 Muscle weakness (generalized): Secondary | ICD-10-CM | POA: Diagnosis not present

## 2017-01-09 DIAGNOSIS — L89620 Pressure ulcer of left heel, unstageable: Secondary | ICD-10-CM | POA: Diagnosis not present

## 2017-01-10 DIAGNOSIS — Z79899 Other long term (current) drug therapy: Secondary | ICD-10-CM | POA: Diagnosis not present

## 2017-01-10 DIAGNOSIS — M6281 Muscle weakness (generalized): Secondary | ICD-10-CM | POA: Diagnosis not present

## 2017-01-10 DIAGNOSIS — R488 Other symbolic dysfunctions: Secondary | ICD-10-CM | POA: Diagnosis not present

## 2017-01-15 DIAGNOSIS — N390 Urinary tract infection, site not specified: Secondary | ICD-10-CM | POA: Diagnosis not present

## 2017-01-16 DIAGNOSIS — M6281 Muscle weakness (generalized): Secondary | ICD-10-CM | POA: Diagnosis not present

## 2017-01-16 DIAGNOSIS — R488 Other symbolic dysfunctions: Secondary | ICD-10-CM | POA: Diagnosis not present

## 2017-01-17 DIAGNOSIS — L89620 Pressure ulcer of left heel, unstageable: Secondary | ICD-10-CM | POA: Diagnosis not present

## 2017-01-17 DIAGNOSIS — R488 Other symbolic dysfunctions: Secondary | ICD-10-CM | POA: Diagnosis not present

## 2017-01-17 DIAGNOSIS — M6281 Muscle weakness (generalized): Secondary | ICD-10-CM | POA: Diagnosis not present

## 2017-01-18 DIAGNOSIS — R488 Other symbolic dysfunctions: Secondary | ICD-10-CM | POA: Diagnosis not present

## 2017-01-18 DIAGNOSIS — M6281 Muscle weakness (generalized): Secondary | ICD-10-CM | POA: Diagnosis not present

## 2017-01-21 DIAGNOSIS — R488 Other symbolic dysfunctions: Secondary | ICD-10-CM | POA: Diagnosis not present

## 2017-01-21 DIAGNOSIS — M6281 Muscle weakness (generalized): Secondary | ICD-10-CM | POA: Diagnosis not present

## 2017-01-22 DIAGNOSIS — M6281 Muscle weakness (generalized): Secondary | ICD-10-CM | POA: Diagnosis not present

## 2017-01-22 DIAGNOSIS — R488 Other symbolic dysfunctions: Secondary | ICD-10-CM | POA: Diagnosis not present

## 2017-01-23 DIAGNOSIS — R488 Other symbolic dysfunctions: Secondary | ICD-10-CM | POA: Diagnosis not present

## 2017-01-23 DIAGNOSIS — M6281 Muscle weakness (generalized): Secondary | ICD-10-CM | POA: Diagnosis not present

## 2017-01-24 DIAGNOSIS — R488 Other symbolic dysfunctions: Secondary | ICD-10-CM | POA: Diagnosis not present

## 2017-01-24 DIAGNOSIS — M6281 Muscle weakness (generalized): Secondary | ICD-10-CM | POA: Diagnosis not present

## 2017-01-25 DIAGNOSIS — R488 Other symbolic dysfunctions: Secondary | ICD-10-CM | POA: Diagnosis not present

## 2017-01-25 DIAGNOSIS — M6281 Muscle weakness (generalized): Secondary | ICD-10-CM | POA: Diagnosis not present

## 2017-01-25 DIAGNOSIS — L89620 Pressure ulcer of left heel, unstageable: Secondary | ICD-10-CM | POA: Diagnosis not present

## 2017-01-26 DIAGNOSIS — M6281 Muscle weakness (generalized): Secondary | ICD-10-CM | POA: Diagnosis not present

## 2017-01-26 DIAGNOSIS — R488 Other symbolic dysfunctions: Secondary | ICD-10-CM | POA: Diagnosis not present

## 2017-01-28 DIAGNOSIS — M6281 Muscle weakness (generalized): Secondary | ICD-10-CM | POA: Diagnosis not present

## 2017-01-28 DIAGNOSIS — R488 Other symbolic dysfunctions: Secondary | ICD-10-CM | POA: Diagnosis not present

## 2017-01-29 DIAGNOSIS — M6281 Muscle weakness (generalized): Secondary | ICD-10-CM | POA: Diagnosis not present

## 2017-01-29 DIAGNOSIS — R488 Other symbolic dysfunctions: Secondary | ICD-10-CM | POA: Diagnosis not present

## 2017-01-30 DIAGNOSIS — R488 Other symbolic dysfunctions: Secondary | ICD-10-CM | POA: Diagnosis not present

## 2017-01-30 DIAGNOSIS — M6281 Muscle weakness (generalized): Secondary | ICD-10-CM | POA: Diagnosis not present

## 2017-01-31 DIAGNOSIS — M6281 Muscle weakness (generalized): Secondary | ICD-10-CM | POA: Diagnosis not present

## 2017-01-31 DIAGNOSIS — R488 Other symbolic dysfunctions: Secondary | ICD-10-CM | POA: Diagnosis not present

## 2017-02-04 ENCOUNTER — Ambulatory Visit: Payer: Self-pay | Admitting: Diagnostic Neuroimaging

## 2017-02-05 DIAGNOSIS — M6281 Muscle weakness (generalized): Secondary | ICD-10-CM | POA: Diagnosis not present

## 2017-02-05 DIAGNOSIS — R488 Other symbolic dysfunctions: Secondary | ICD-10-CM | POA: Diagnosis not present

## 2017-02-07 DIAGNOSIS — M6281 Muscle weakness (generalized): Secondary | ICD-10-CM | POA: Diagnosis not present

## 2017-02-07 DIAGNOSIS — L89620 Pressure ulcer of left heel, unstageable: Secondary | ICD-10-CM | POA: Diagnosis not present

## 2017-02-07 DIAGNOSIS — I739 Peripheral vascular disease, unspecified: Secondary | ICD-10-CM | POA: Diagnosis not present

## 2017-02-07 DIAGNOSIS — R488 Other symbolic dysfunctions: Secondary | ICD-10-CM | POA: Diagnosis not present

## 2017-02-08 DIAGNOSIS — M79606 Pain in leg, unspecified: Secondary | ICD-10-CM | POA: Diagnosis not present

## 2017-02-08 DIAGNOSIS — M6281 Muscle weakness (generalized): Secondary | ICD-10-CM | POA: Diagnosis not present

## 2017-02-08 DIAGNOSIS — R2681 Unsteadiness on feet: Secondary | ICD-10-CM | POA: Diagnosis not present

## 2017-02-08 DIAGNOSIS — R1319 Other dysphagia: Secondary | ICD-10-CM | POA: Diagnosis not present

## 2017-02-11 ENCOUNTER — Encounter: Payer: Self-pay | Admitting: Diagnostic Neuroimaging

## 2017-02-11 DIAGNOSIS — R1319 Other dysphagia: Secondary | ICD-10-CM | POA: Diagnosis not present

## 2017-02-11 DIAGNOSIS — R2681 Unsteadiness on feet: Secondary | ICD-10-CM | POA: Diagnosis not present

## 2017-02-12 DIAGNOSIS — R1319 Other dysphagia: Secondary | ICD-10-CM | POA: Diagnosis not present

## 2017-02-12 DIAGNOSIS — R2681 Unsteadiness on feet: Secondary | ICD-10-CM | POA: Diagnosis not present

## 2017-02-13 DIAGNOSIS — L89620 Pressure ulcer of left heel, unstageable: Secondary | ICD-10-CM | POA: Diagnosis not present

## 2017-02-13 DIAGNOSIS — R1319 Other dysphagia: Secondary | ICD-10-CM | POA: Diagnosis not present

## 2017-02-13 DIAGNOSIS — R2681 Unsteadiness on feet: Secondary | ICD-10-CM | POA: Diagnosis not present

## 2017-02-14 DIAGNOSIS — R1319 Other dysphagia: Secondary | ICD-10-CM | POA: Diagnosis not present

## 2017-02-14 DIAGNOSIS — N390 Urinary tract infection, site not specified: Secondary | ICD-10-CM | POA: Diagnosis not present

## 2017-02-14 DIAGNOSIS — R2681 Unsteadiness on feet: Secondary | ICD-10-CM | POA: Diagnosis not present

## 2017-02-15 DIAGNOSIS — R1319 Other dysphagia: Secondary | ICD-10-CM | POA: Diagnosis not present

## 2017-02-15 DIAGNOSIS — R2681 Unsteadiness on feet: Secondary | ICD-10-CM | POA: Diagnosis not present

## 2017-02-18 DIAGNOSIS — R1319 Other dysphagia: Secondary | ICD-10-CM | POA: Diagnosis not present

## 2017-02-18 DIAGNOSIS — R2681 Unsteadiness on feet: Secondary | ICD-10-CM | POA: Diagnosis not present

## 2017-02-19 DIAGNOSIS — R2681 Unsteadiness on feet: Secondary | ICD-10-CM | POA: Diagnosis not present

## 2017-02-19 DIAGNOSIS — R1319 Other dysphagia: Secondary | ICD-10-CM | POA: Diagnosis not present

## 2017-02-20 DIAGNOSIS — R1319 Other dysphagia: Secondary | ICD-10-CM | POA: Diagnosis not present

## 2017-02-20 DIAGNOSIS — R2681 Unsteadiness on feet: Secondary | ICD-10-CM | POA: Diagnosis not present

## 2017-02-21 DIAGNOSIS — M6281 Muscle weakness (generalized): Secondary | ICD-10-CM | POA: Diagnosis not present

## 2017-02-21 DIAGNOSIS — I739 Peripheral vascular disease, unspecified: Secondary | ICD-10-CM | POA: Diagnosis not present

## 2017-02-21 DIAGNOSIS — L89620 Pressure ulcer of left heel, unstageable: Secondary | ICD-10-CM | POA: Diagnosis not present

## 2017-02-26 DIAGNOSIS — M79606 Pain in leg, unspecified: Secondary | ICD-10-CM | POA: Diagnosis not present

## 2017-03-07 DIAGNOSIS — Z872 Personal history of diseases of the skin and subcutaneous tissue: Secondary | ICD-10-CM | POA: Diagnosis not present

## 2017-03-11 ENCOUNTER — Ambulatory Visit: Payer: Self-pay | Admitting: Diagnostic Neuroimaging

## 2017-03-20 DIAGNOSIS — G894 Chronic pain syndrome: Secondary | ICD-10-CM | POA: Diagnosis not present

## 2017-03-21 DIAGNOSIS — K5900 Constipation, unspecified: Secondary | ICD-10-CM | POA: Diagnosis not present

## 2017-03-22 DIAGNOSIS — R109 Unspecified abdominal pain: Secondary | ICD-10-CM | POA: Diagnosis not present

## 2017-03-22 DIAGNOSIS — K5900 Constipation, unspecified: Secondary | ICD-10-CM | POA: Diagnosis not present

## 2017-03-23 DIAGNOSIS — R1084 Generalized abdominal pain: Secondary | ICD-10-CM | POA: Diagnosis not present

## 2017-03-23 DIAGNOSIS — K5900 Constipation, unspecified: Secondary | ICD-10-CM | POA: Diagnosis not present

## 2017-03-25 ENCOUNTER — Other Ambulatory Visit: Payer: Self-pay

## 2017-03-25 ENCOUNTER — Emergency Department (HOSPITAL_COMMUNITY): Payer: Medicare Other

## 2017-03-25 ENCOUNTER — Emergency Department (HOSPITAL_COMMUNITY)
Admission: EM | Admit: 2017-03-25 | Discharge: 2017-03-25 | Disposition: A | Discharge status: Home / Self Care | Payer: Medicare Other | Attending: Emergency Medicine | Admitting: Emergency Medicine

## 2017-03-25 ENCOUNTER — Encounter (HOSPITAL_COMMUNITY): Payer: Self-pay | Admitting: Emergency Medicine

## 2017-03-25 DIAGNOSIS — R42 Dizziness and giddiness: Secondary | ICD-10-CM | POA: Diagnosis not present

## 2017-03-25 DIAGNOSIS — R079 Chest pain, unspecified: Secondary | ICD-10-CM | POA: Diagnosis not present

## 2017-03-25 DIAGNOSIS — Z79899 Other long term (current) drug therapy: Secondary | ICD-10-CM | POA: Insufficient documentation

## 2017-03-25 DIAGNOSIS — Z87891 Personal history of nicotine dependence: Secondary | ICD-10-CM | POA: Diagnosis not present

## 2017-03-25 DIAGNOSIS — I10 Essential (primary) hypertension: Secondary | ICD-10-CM | POA: Diagnosis not present

## 2017-03-25 DIAGNOSIS — F0390 Unspecified dementia without behavioral disturbance: Secondary | ICD-10-CM | POA: Insufficient documentation

## 2017-03-25 DIAGNOSIS — K5900 Constipation, unspecified: Secondary | ICD-10-CM | POA: Diagnosis not present

## 2017-03-25 DIAGNOSIS — J449 Chronic obstructive pulmonary disease, unspecified: Secondary | ICD-10-CM | POA: Insufficient documentation

## 2017-03-25 DIAGNOSIS — R0789 Other chest pain: Secondary | ICD-10-CM | POA: Diagnosis not present

## 2017-03-25 LAB — BASIC METABOLIC PANEL
ANION GAP: 11 (ref 5–15)
BUN: 10 mg/dL (ref 6–20)
CALCIUM: 8.8 mg/dL — AB (ref 8.9–10.3)
CO2: 25 mmol/L (ref 22–32)
Chloride: 97 mmol/L — ABNORMAL LOW (ref 101–111)
Creatinine, Ser: 0.85 mg/dL (ref 0.61–1.24)
GFR calc Af Amer: >60 mL/min (ref >60)
Glucose, Bld: 95 mg/dL (ref 65–99)
Potassium: 4.5 mmol/L (ref 3.5–5.1)
Sodium: 133 mmol/L — ABNORMAL LOW (ref 135–145)

## 2017-03-25 LAB — CBC
HCT: 42.9 % (ref 39.0–52.0)
Hemoglobin: 14.3 g/dL (ref 13.0–17.0)
MCH: 31.8 pg (ref 26.0–34.0)
MCHC: 33.3 g/dL (ref 30.0–36.0)
MCV: 95.3 fL (ref 78.0–100.0)
Platelets: 348 10*3/uL (ref 150–400)
RBC: 4.5 MIL/uL (ref 4.22–5.81)
RDW: 14.8 % (ref 11.5–15.5)
WBC: 10.9 10*3/uL — AB (ref 4.0–10.5)

## 2017-03-25 LAB — I-STAT TROPONIN, ED
TROPONIN I, POC: 0.01 ng/mL (ref 0.00–0.08)
Troponin i, poc: 0 ng/mL (ref 0.00–0.08)

## 2017-03-25 NOTE — ED Notes (Signed)
Denies chest pain. Only c/o bilateral feet pain.
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
## 2017-03-25 NOTE — ED Notes (Signed)
[facility] arrived for transport 

## 2017-03-25 NOTE — ED Notes (Signed)
Spouse states that patient has short term memory loss. Unable to remember 5 minutes ago - per wife.

## 2017-03-25 NOTE — ED Triage Notes (Addendum)
[facility] with central cp that began when he woke this morning. Given 324 ASA and 1 NTG PTA. Arrives a&ox3 - has not taken psych meds today. BP 116/70, states pain currently 4/10. Denies n/v or sob

## 2017-03-25 NOTE — ED Notes (Signed)
Patient transported to X-ray 

## 2017-03-26 DIAGNOSIS — I10 Essential (primary) hypertension: Secondary | ICD-10-CM | POA: Diagnosis not present

## 2017-03-27 DIAGNOSIS — G894 Chronic pain syndrome: Secondary | ICD-10-CM | POA: Diagnosis not present

## 2017-04-01 DIAGNOSIS — Z9181 History of falling: Secondary | ICD-10-CM | POA: Diagnosis not present

## 2017-04-18 DIAGNOSIS — G894 Chronic pain syndrome: Secondary | ICD-10-CM | POA: Diagnosis not present

## 2017-04-29 ENCOUNTER — Ambulatory Visit: Payer: Self-pay | Admitting: Diagnostic Neuroimaging

## 2017-05-02 DIAGNOSIS — R109 Unspecified abdominal pain: Secondary | ICD-10-CM | POA: Diagnosis not present

## 2017-05-02 DIAGNOSIS — R451 Restlessness and agitation: Secondary | ICD-10-CM | POA: Diagnosis not present

## 2017-05-02 DIAGNOSIS — N390 Urinary tract infection, site not specified: Secondary | ICD-10-CM | POA: Diagnosis not present

## 2017-05-03 DIAGNOSIS — Z79899 Other long term (current) drug therapy: Secondary | ICD-10-CM | POA: Diagnosis not present

## 2017-05-03 DIAGNOSIS — N3090 Cystitis, unspecified without hematuria: Secondary | ICD-10-CM | POA: Diagnosis not present

## 2017-05-06 DIAGNOSIS — N3090 Cystitis, unspecified without hematuria: Secondary | ICD-10-CM | POA: Diagnosis not present

## 2017-05-28 DIAGNOSIS — Z79899 Other long term (current) drug therapy: Secondary | ICD-10-CM | POA: Diagnosis not present

## 2017-06-05 DIAGNOSIS — R748 Abnormal levels of other serum enzymes: Secondary | ICD-10-CM | POA: Diagnosis not present

## 2017-06-12 DIAGNOSIS — G894 Chronic pain syndrome: Secondary | ICD-10-CM | POA: Diagnosis not present

## 2017-06-14 DIAGNOSIS — Z79899 Other long term (current) drug therapy: Secondary | ICD-10-CM | POA: Diagnosis not present

## 2017-06-22 DIAGNOSIS — N390 Urinary tract infection, site not specified: Secondary | ICD-10-CM | POA: Diagnosis not present

## 2017-07-05 DIAGNOSIS — M2041 Other hammer toe(s) (acquired), right foot: Secondary | ICD-10-CM | POA: Diagnosis not present

## 2017-07-05 DIAGNOSIS — I739 Peripheral vascular disease, unspecified: Secondary | ICD-10-CM | POA: Diagnosis not present

## 2017-07-05 DIAGNOSIS — B351 Tinea unguium: Secondary | ICD-10-CM | POA: Diagnosis not present

## 2017-07-05 DIAGNOSIS — M2042 Other hammer toe(s) (acquired), left foot: Secondary | ICD-10-CM | POA: Diagnosis not present

## 2017-07-10 DIAGNOSIS — R296 Repeated falls: Secondary | ICD-10-CM | POA: Diagnosis not present

## 2017-07-10 DIAGNOSIS — R2681 Unsteadiness on feet: Secondary | ICD-10-CM | POA: Diagnosis not present

## 2017-07-10 DIAGNOSIS — R1319 Other dysphagia: Secondary | ICD-10-CM | POA: Diagnosis not present

## 2017-07-10 DIAGNOSIS — R278 Other lack of coordination: Secondary | ICD-10-CM | POA: Diagnosis not present

## 2017-07-10 DIAGNOSIS — M79662 Pain in left lower leg: Secondary | ICD-10-CM | POA: Diagnosis not present

## 2017-07-10 DIAGNOSIS — M79661 Pain in right lower leg: Secondary | ICD-10-CM | POA: Diagnosis not present

## 2017-07-11 DIAGNOSIS — R296 Repeated falls: Secondary | ICD-10-CM | POA: Diagnosis not present

## 2017-07-11 DIAGNOSIS — R2681 Unsteadiness on feet: Secondary | ICD-10-CM | POA: Diagnosis not present

## 2017-07-11 DIAGNOSIS — M79662 Pain in left lower leg: Secondary | ICD-10-CM | POA: Diagnosis not present

## 2017-07-11 DIAGNOSIS — M79661 Pain in right lower leg: Secondary | ICD-10-CM | POA: Diagnosis not present

## 2017-07-11 DIAGNOSIS — R1319 Other dysphagia: Secondary | ICD-10-CM | POA: Diagnosis not present

## 2017-07-11 DIAGNOSIS — R278 Other lack of coordination: Secondary | ICD-10-CM | POA: Diagnosis not present

## 2017-07-15 DIAGNOSIS — R278 Other lack of coordination: Secondary | ICD-10-CM | POA: Diagnosis not present

## 2017-07-15 DIAGNOSIS — R2681 Unsteadiness on feet: Secondary | ICD-10-CM | POA: Diagnosis not present

## 2017-07-15 DIAGNOSIS — M79661 Pain in right lower leg: Secondary | ICD-10-CM | POA: Diagnosis not present

## 2017-07-15 DIAGNOSIS — M79662 Pain in left lower leg: Secondary | ICD-10-CM | POA: Diagnosis not present

## 2017-07-15 DIAGNOSIS — R451 Restlessness and agitation: Secondary | ICD-10-CM | POA: Diagnosis not present

## 2017-07-15 DIAGNOSIS — R1319 Other dysphagia: Secondary | ICD-10-CM | POA: Diagnosis not present

## 2017-07-15 DIAGNOSIS — R296 Repeated falls: Secondary | ICD-10-CM | POA: Diagnosis not present

## 2017-07-16 DIAGNOSIS — R2681 Unsteadiness on feet: Secondary | ICD-10-CM | POA: Diagnosis not present

## 2017-07-16 DIAGNOSIS — R278 Other lack of coordination: Secondary | ICD-10-CM | POA: Diagnosis not present

## 2017-07-16 DIAGNOSIS — R1319 Other dysphagia: Secondary | ICD-10-CM | POA: Diagnosis not present

## 2017-07-16 DIAGNOSIS — R296 Repeated falls: Secondary | ICD-10-CM | POA: Diagnosis not present

## 2017-07-16 DIAGNOSIS — M79661 Pain in right lower leg: Secondary | ICD-10-CM | POA: Diagnosis not present

## 2017-07-16 DIAGNOSIS — M79662 Pain in left lower leg: Secondary | ICD-10-CM | POA: Diagnosis not present

## 2017-07-17 DIAGNOSIS — R2681 Unsteadiness on feet: Secondary | ICD-10-CM | POA: Diagnosis not present

## 2017-07-17 DIAGNOSIS — R278 Other lack of coordination: Secondary | ICD-10-CM | POA: Diagnosis not present

## 2017-07-17 DIAGNOSIS — M79661 Pain in right lower leg: Secondary | ICD-10-CM | POA: Diagnosis not present

## 2017-07-17 DIAGNOSIS — R1319 Other dysphagia: Secondary | ICD-10-CM | POA: Diagnosis not present

## 2017-07-17 DIAGNOSIS — R296 Repeated falls: Secondary | ICD-10-CM | POA: Diagnosis not present

## 2017-07-17 DIAGNOSIS — M79662 Pain in left lower leg: Secondary | ICD-10-CM | POA: Diagnosis not present

## 2017-07-18 DIAGNOSIS — M79661 Pain in right lower leg: Secondary | ICD-10-CM | POA: Diagnosis not present

## 2017-07-18 DIAGNOSIS — R278 Other lack of coordination: Secondary | ICD-10-CM | POA: Diagnosis not present

## 2017-07-18 DIAGNOSIS — R2681 Unsteadiness on feet: Secondary | ICD-10-CM | POA: Diagnosis not present

## 2017-07-18 DIAGNOSIS — R1319 Other dysphagia: Secondary | ICD-10-CM | POA: Diagnosis not present

## 2017-07-18 DIAGNOSIS — R296 Repeated falls: Secondary | ICD-10-CM | POA: Diagnosis not present

## 2017-07-18 DIAGNOSIS — M79662 Pain in left lower leg: Secondary | ICD-10-CM | POA: Diagnosis not present

## 2017-07-19 DIAGNOSIS — R2681 Unsteadiness on feet: Secondary | ICD-10-CM | POA: Diagnosis not present

## 2017-07-19 DIAGNOSIS — M79661 Pain in right lower leg: Secondary | ICD-10-CM | POA: Diagnosis not present

## 2017-07-19 DIAGNOSIS — R296 Repeated falls: Secondary | ICD-10-CM | POA: Diagnosis not present

## 2017-07-19 DIAGNOSIS — M79662 Pain in left lower leg: Secondary | ICD-10-CM | POA: Diagnosis not present

## 2017-07-19 DIAGNOSIS — R278 Other lack of coordination: Secondary | ICD-10-CM | POA: Diagnosis not present

## 2017-07-19 DIAGNOSIS — R1319 Other dysphagia: Secondary | ICD-10-CM | POA: Diagnosis not present

## 2017-07-22 ENCOUNTER — Other Ambulatory Visit: Payer: Self-pay

## 2017-07-22 ENCOUNTER — Emergency Department (HOSPITAL_COMMUNITY)
Admission: EM | Admit: 2017-07-22 | Discharge: 2017-07-23 | Disposition: A | Discharge status: Home / Self Care | Payer: Medicare Other | Attending: Emergency Medicine | Admitting: Emergency Medicine

## 2017-07-22 ENCOUNTER — Encounter (HOSPITAL_COMMUNITY): Payer: Self-pay | Admitting: Emergency Medicine

## 2017-07-22 DIAGNOSIS — M79661 Pain in right lower leg: Secondary | ICD-10-CM | POA: Diagnosis not present

## 2017-07-22 DIAGNOSIS — Z8673 Personal history of transient ischemic attack (TIA), and cerebral infarction without residual deficits: Secondary | ICD-10-CM | POA: Diagnosis not present

## 2017-07-22 DIAGNOSIS — R278 Other lack of coordination: Secondary | ICD-10-CM | POA: Diagnosis not present

## 2017-07-22 DIAGNOSIS — F0391 Unspecified dementia with behavioral disturbance: Secondary | ICD-10-CM | POA: Insufficient documentation

## 2017-07-22 DIAGNOSIS — J449 Chronic obstructive pulmonary disease, unspecified: Secondary | ICD-10-CM | POA: Diagnosis not present

## 2017-07-22 DIAGNOSIS — M79662 Pain in left lower leg: Secondary | ICD-10-CM | POA: Diagnosis not present

## 2017-07-22 DIAGNOSIS — R569 Unspecified convulsions: Secondary | ICD-10-CM | POA: Diagnosis not present

## 2017-07-22 DIAGNOSIS — N390 Urinary tract infection, site not specified: Secondary | ICD-10-CM | POA: Insufficient documentation

## 2017-07-22 DIAGNOSIS — Z7901 Long term (current) use of anticoagulants: Secondary | ICD-10-CM | POA: Insufficient documentation

## 2017-07-22 DIAGNOSIS — I10 Essential (primary) hypertension: Secondary | ICD-10-CM | POA: Insufficient documentation

## 2017-07-22 DIAGNOSIS — R296 Repeated falls: Secondary | ICD-10-CM | POA: Diagnosis not present

## 2017-07-22 DIAGNOSIS — Z79899 Other long term (current) drug therapy: Secondary | ICD-10-CM | POA: Insufficient documentation

## 2017-07-22 DIAGNOSIS — F17210 Nicotine dependence, cigarettes, uncomplicated: Secondary | ICD-10-CM | POA: Diagnosis not present

## 2017-07-22 DIAGNOSIS — R1319 Other dysphagia: Secondary | ICD-10-CM | POA: Diagnosis not present

## 2017-07-22 DIAGNOSIS — R2681 Unsteadiness on feet: Secondary | ICD-10-CM | POA: Diagnosis not present

## 2017-07-22 LAB — CBC WITH DIFFERENTIAL/PLATELET
Abs Immature Granulocytes: 0 10*3/uL (ref 0.0–0.1)
BASOS ABS: 0 10*3/uL (ref 0.0–0.1)
BASOS PCT: 0 %
Eosinophils Absolute: 0.2 10*3/uL (ref 0.0–0.7)
Eosinophils Relative: 2 %
HCT: 39.6 % (ref 39.0–52.0)
HEMOGLOBIN: 13.3 g/dL (ref 13.0–17.0)
Immature Granulocytes: 0 %
LYMPHS PCT: 23 %
Lymphs Abs: 2.2 10*3/uL (ref 0.7–4.0)
MCH: 32.2 pg (ref 26.0–34.0)
MCHC: 33.6 g/dL (ref 30.0–36.0)
MCV: 95.9 fL (ref 78.0–100.0)
Monocytes Absolute: 0.8 10*3/uL (ref 0.1–1.0)
Monocytes Relative: 8 %
NEUTROS ABS: 6.4 10*3/uL (ref 1.7–7.7)
NEUTROS PCT: 67 %
Platelets: 289 10*3/uL (ref 150–400)
RBC: 4.13 MIL/uL — AB (ref 4.22–5.81)
RDW: 14.1 % (ref 11.5–15.5)
WBC: 9.6 10*3/uL (ref 4.0–10.5)

## 2017-07-22 LAB — COMPREHENSIVE METABOLIC PANEL
ALBUMIN: 3.3 g/dL — AB (ref 3.5–5.0)
ALK PHOS: 85 U/L (ref 38–126)
ALT: 17 U/L (ref 0–44)
ANION GAP: 10 (ref 5–15)
AST: 20 U/L (ref 15–41)
BUN: 9 mg/dL (ref 8–23)
CALCIUM: 8.8 mg/dL — AB (ref 8.9–10.3)
CO2: 26 mmol/L (ref 22–32)
Chloride: 104 mmol/L (ref 98–111)
Creatinine, Ser: 0.81 mg/dL (ref 0.61–1.24)
GFR calc Af Amer: >60 mL/min (ref >60)
GFR calc non Af Amer: >60 mL/min (ref >60)
Glucose, Bld: 108 mg/dL — ABNORMAL HIGH (ref 70–99)
POTASSIUM: 3.9 mmol/L (ref 3.5–5.1)
SODIUM: 140 mmol/L (ref 135–145)
TOTAL PROTEIN: 6.4 g/dL — AB (ref 6.5–8.1)
Total Bilirubin: 0.5 mg/dL (ref 0.3–1.2)

## 2017-07-22 LAB — URINALYSIS, ROUTINE W REFLEX MICROSCOPIC
BILIRUBIN URINE: NEGATIVE
Glucose, UA: NEGATIVE mg/dL
HGB URINE DIPSTICK: NEGATIVE
KETONES UR: NEGATIVE mg/dL
NITRITE: NEGATIVE
PH: 8 (ref 5.0–8.0)
Protein, ur: 30 mg/dL — AB
SPECIFIC GRAVITY, URINE: 1.018 (ref 1.005–1.030)
WBC, UA: >50 WBC/hpf — ABNORMAL HIGH (ref 0–5)

## 2017-07-22 LAB — CARBAMAZEPINE LEVEL, TOTAL: Carbamazepine Lvl: 3.4 ug/mL — ABNORMAL LOW (ref 4.0–12.0)

## 2017-07-22 LAB — VALPROIC ACID LEVEL: Valproic Acid Lvl: <10 ug/mL — ABNORMAL LOW (ref 50.0–100.0)

## 2017-07-22 MED ORDER — LACOSAMIDE 200 MG PO TABS
200.0000 mg | ORAL_TABLET | Freq: Once | ORAL | Status: AC
  Administered 2017-07-23: 200 mg via ORAL
  Filled 2017-07-22: qty 1

## 2017-07-22 MED ORDER — SODIUM CHLORIDE 0.9 % IV SOLN
INTRAVENOUS | Status: DC
  Administered 2017-07-22: 20:00:00 via INTRAVENOUS

## 2017-07-22 MED ORDER — CEPHALEXIN 500 MG PO CAPS: 500.0000 mg | ORAL_CAPSULE | Freq: Four times a day (QID) | ORAL | 0 refills | Status: DC

## 2017-07-22 MED ORDER — CARBAMAZEPINE 200 MG PO TABS
400.0000 mg | ORAL_TABLET | Freq: Once | ORAL | Status: AC
  Administered 2017-07-23: 400 mg via ORAL
  Filled 2017-07-22: qty 2

## 2017-07-22 MED ORDER — CEPHALEXIN 250 MG PO CAPS
500.0000 mg | ORAL_CAPSULE | Freq: Once | ORAL | Status: AC
  Administered 2017-07-23: 500 mg via ORAL
  Filled 2017-07-22: qty 2

## 2017-07-22 MED ORDER — LEVETIRACETAM IN NACL 500 MG/100ML IV SOLN
500.0000 mg | Freq: Once | INTRAVENOUS | Status: AC
  Administered 2017-07-22: 500 mg via INTRAVENOUS
  Filled 2017-07-22 (×2): qty 100

## 2017-07-22 NOTE — ED Triage Notes (Signed)
Per [facility], [facility]. Pt has hx of seizures, apparently had one unwitnessed seizure this morning and one unwitnessed seizure this afternoon. Staff reports it is not normal for pt to have multiple seizures so they sent him here. Pt alert and oriented, follows commands. Pt does not have any oral trauma.

## 2017-07-22 NOTE — ED Notes (Signed)
Lab to add on valproic acid 
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
## 2017-07-22 NOTE — Discharge Instructions (Addendum)
You appear to have a urinary tract infection today and will be treated with Keflex which you will take 3 times a day.  Also you had a seizure today because your seizure medication levels were too low.  You were given replacement of your medications and need to continue taking your normal dose without missing any.  If you continue to have seizures you need to follow up with your neurologist.  Return for worsening symptoms, high fever or mental status changes.

## 2017-07-23 DIAGNOSIS — R1319 Other dysphagia: Secondary | ICD-10-CM | POA: Diagnosis not present

## 2017-07-23 DIAGNOSIS — R569 Unspecified convulsions: Secondary | ICD-10-CM | POA: Diagnosis not present

## 2017-07-23 DIAGNOSIS — R296 Repeated falls: Secondary | ICD-10-CM | POA: Diagnosis not present

## 2017-07-23 DIAGNOSIS — R2681 Unsteadiness on feet: Secondary | ICD-10-CM | POA: Diagnosis not present

## 2017-07-23 DIAGNOSIS — M79661 Pain in right lower leg: Secondary | ICD-10-CM | POA: Diagnosis not present

## 2017-07-23 DIAGNOSIS — Z7401 Bed confinement status: Secondary | ICD-10-CM | POA: Diagnosis not present

## 2017-07-23 DIAGNOSIS — R278 Other lack of coordination: Secondary | ICD-10-CM | POA: Diagnosis not present

## 2017-07-23 DIAGNOSIS — N390 Urinary tract infection, site not specified: Secondary | ICD-10-CM | POA: Diagnosis not present

## 2017-07-23 DIAGNOSIS — M2550 Pain in unspecified joint: Secondary | ICD-10-CM | POA: Diagnosis not present

## 2017-07-23 DIAGNOSIS — M79662 Pain in left lower leg: Secondary | ICD-10-CM | POA: Diagnosis not present

## 2017-07-24 ENCOUNTER — Other Ambulatory Visit: Payer: Self-pay

## 2017-07-24 ENCOUNTER — Emergency Department (HOSPITAL_COMMUNITY)
Admission: EM | Admit: 2017-07-24 | Discharge: 2017-07-24 | Disposition: A | Discharge status: Home / Self Care | Payer: Medicare Other | Attending: Emergency Medicine | Admitting: Emergency Medicine

## 2017-07-24 ENCOUNTER — Emergency Department (HOSPITAL_COMMUNITY): Payer: Medicare Other

## 2017-07-24 ENCOUNTER — Encounter (HOSPITAL_COMMUNITY): Payer: Self-pay

## 2017-07-24 DIAGNOSIS — M79662 Pain in left lower leg: Secondary | ICD-10-CM | POA: Diagnosis not present

## 2017-07-24 DIAGNOSIS — R569 Unspecified convulsions: Secondary | ICD-10-CM | POA: Diagnosis not present

## 2017-07-24 DIAGNOSIS — M79661 Pain in right lower leg: Secondary | ICD-10-CM | POA: Diagnosis not present

## 2017-07-24 DIAGNOSIS — F0390 Unspecified dementia without behavioral disturbance: Secondary | ICD-10-CM | POA: Insufficient documentation

## 2017-07-24 DIAGNOSIS — Z79899 Other long term (current) drug therapy: Secondary | ICD-10-CM | POA: Diagnosis not present

## 2017-07-24 DIAGNOSIS — R296 Repeated falls: Secondary | ICD-10-CM | POA: Diagnosis not present

## 2017-07-24 DIAGNOSIS — G40909 Epilepsy, unspecified, not intractable, without status epilepticus: Secondary | ICD-10-CM | POA: Insufficient documentation

## 2017-07-24 DIAGNOSIS — Z87891 Personal history of nicotine dependence: Secondary | ICD-10-CM | POA: Diagnosis not present

## 2017-07-24 DIAGNOSIS — N3001 Acute cystitis with hematuria: Secondary | ICD-10-CM

## 2017-07-24 DIAGNOSIS — R2681 Unsteadiness on feet: Secondary | ICD-10-CM | POA: Diagnosis not present

## 2017-07-24 DIAGNOSIS — M2550 Pain in unspecified joint: Secondary | ICD-10-CM | POA: Diagnosis not present

## 2017-07-24 DIAGNOSIS — J449 Chronic obstructive pulmonary disease, unspecified: Secondary | ICD-10-CM | POA: Insufficient documentation

## 2017-07-24 DIAGNOSIS — R278 Other lack of coordination: Secondary | ICD-10-CM | POA: Diagnosis not present

## 2017-07-24 DIAGNOSIS — Z8673 Personal history of transient ischemic attack (TIA), and cerebral infarction without residual deficits: Secondary | ICD-10-CM | POA: Diagnosis not present

## 2017-07-24 DIAGNOSIS — Z7401 Bed confinement status: Secondary | ICD-10-CM | POA: Diagnosis not present

## 2017-07-24 DIAGNOSIS — Z7902 Long term (current) use of antithrombotics/antiplatelets: Secondary | ICD-10-CM | POA: Diagnosis not present

## 2017-07-24 DIAGNOSIS — R1319 Other dysphagia: Secondary | ICD-10-CM | POA: Diagnosis not present

## 2017-07-24 DIAGNOSIS — I10 Essential (primary) hypertension: Secondary | ICD-10-CM | POA: Insufficient documentation

## 2017-07-24 DIAGNOSIS — N3000 Acute cystitis without hematuria: Secondary | ICD-10-CM | POA: Diagnosis not present

## 2017-07-24 LAB — CBC WITH DIFFERENTIAL/PLATELET
Basophils Absolute: 0 10*3/uL (ref 0.0–0.1)
Basophils Relative: 0 %
EOS ABS: 0.3 10*3/uL (ref 0.0–0.7)
EOS PCT: 3 %
HCT: 38.3 % — ABNORMAL LOW (ref 39.0–52.0)
HEMOGLOBIN: 12.9 g/dL — AB (ref 13.0–17.0)
LYMPHS ABS: 2.8 10*3/uL (ref 0.7–4.0)
Lymphocytes Relative: 36 %
MCH: 32.3 pg (ref 26.0–34.0)
MCHC: 33.7 g/dL (ref 30.0–36.0)
MCV: 95.8 fL (ref 78.0–100.0)
MONO ABS: 0.6 10*3/uL (ref 0.1–1.0)
MONOS PCT: 8 %
NEUTROS PCT: 53 %
Neutro Abs: 4.2 10*3/uL (ref 1.7–7.7)
Platelets: 324 10*3/uL (ref 150–400)
RBC: 4 MIL/uL — ABNORMAL LOW (ref 4.22–5.81)
RDW: 14.7 % (ref 11.5–15.5)
WBC: 7.9 10*3/uL (ref 4.0–10.5)

## 2017-07-24 LAB — COMPREHENSIVE METABOLIC PANEL
ALK PHOS: 80 U/L (ref 38–126)
ALT: 16 U/L (ref 0–44)
ANION GAP: 6 (ref 5–15)
AST: 19 U/L (ref 15–41)
Albumin: 3.2 g/dL — ABNORMAL LOW (ref 3.5–5.0)
BUN: 9 mg/dL (ref 8–23)
CALCIUM: 8.6 mg/dL — AB (ref 8.9–10.3)
CO2: 27 mmol/L (ref 22–32)
Chloride: 110 mmol/L (ref 98–111)
Creatinine, Ser: 0.58 mg/dL — ABNORMAL LOW (ref 0.61–1.24)
GFR calc non Af Amer: >60 mL/min (ref >60)
Glucose, Bld: 88 mg/dL (ref 70–99)
Potassium: 4 mmol/L (ref 3.5–5.1)
SODIUM: 143 mmol/L (ref 135–145)
Total Bilirubin: 0.7 mg/dL (ref 0.3–1.2)
Total Protein: 6.2 g/dL — ABNORMAL LOW (ref 6.5–8.1)

## 2017-07-24 LAB — PHOSPHORUS: Phosphorus: 3.5 mg/dL (ref 2.5–4.6)

## 2017-07-24 LAB — URINALYSIS, ROUTINE W REFLEX MICROSCOPIC
Bilirubin Urine: NEGATIVE
GLUCOSE, UA: NEGATIVE mg/dL
Ketones, ur: NEGATIVE mg/dL
NITRITE: NEGATIVE
PH: 5 (ref 5.0–8.0)
Protein, ur: NEGATIVE mg/dL
RBC / HPF: >50 RBC/hpf — ABNORMAL HIGH (ref 0–5)
SPECIFIC GRAVITY, URINE: 1.017 (ref 1.005–1.030)

## 2017-07-24 LAB — MAGNESIUM: Magnesium: 1.8 mg/dL (ref 1.7–2.4)

## 2017-07-24 LAB — VALPROIC ACID LEVEL: Valproic Acid Lvl: <10 ug/mL — ABNORMAL LOW (ref 50.0–100.0)

## 2017-07-24 LAB — CARBAMAZEPINE LEVEL, TOTAL: Carbamazepine Lvl: 5.1 ug/mL (ref 4.0–12.0)

## 2017-07-24 MED ORDER — SODIUM CHLORIDE 0.9 % IV SOLN
1.0000 g | Freq: Once | INTRAVENOUS | Status: AC
  Administered 2017-07-24: 1 g via INTRAVENOUS
  Filled 2017-07-24: qty 10

## 2017-07-24 MED ORDER — DIVALPROEX SODIUM 500 MG PO DR TAB: 500.0000 mg | DELAYED_RELEASE_TABLET | Freq: Once | ORAL | Status: DC

## 2017-07-24 MED ORDER — DIVALPROEX SODIUM 125 MG PO DR TAB: 400.0000 mg | DELAYED_RELEASE_TABLET | Freq: Three times a day (TID) | ORAL | 1 refills | Status: DC

## 2017-07-24 MED ORDER — VALPROATE SODIUM 500 MG/5ML IV SOLN
1250.0000 mg | Freq: Once | INTRAVENOUS | Status: AC
  Administered 2017-07-24: 1250 mg via INTRAVENOUS
  Filled 2017-07-24: qty 12.5

## 2017-07-24 MED ORDER — SODIUM CHLORIDE 0.9 % IV SOLN
INTRAVENOUS | Status: DC
Start: 2017-07-24 — End: 2017-07-25
  Administered 2017-07-24: 20:00:00 via INTRAVENOUS

## 2017-07-24 NOTE — ED Provider Notes (Signed)
Clinical Course as of Jul 24 2229  Wed Jul 24, 2017  [id number] Awaiting completion of lab results.  Comprehensive metabolic panel, valproate level and carbamazepine level still pending.   [MP]  1849 Patient is sleeping quietly with no respiratory distress and normal vital signs.   [MP]  2001 Tegretol level still pending.  Depakote level less than 10.  Will give oral dose of Depakote 500 mg and call neurology.  Urinalysis still shows infection with some improvement.  Patient was just started on Keflex 2 days ago, will give 1 IV dose of Rocephin.  Cultures are showing Proteus with pending sensitivities.   [MP]  2013 Consult with Dr. Otelia LimesLindzen.  Recommends a 20 meg per cake IV load of Depakote.  Adding serum magnesium calcium.  Confirming patient's nursing home Depakote level with pharmacy.  If patient is taking Depakote as listed, plan is to double that dose.  If it had been discontinued plan is to resume at the listed dose.   [MP]  2228 Patient is now watching television.  He answers questions.  He has been drinking soda.  He has not had any seizure activity.  Reviewed with Dr. Otelia LimesLindzen that [facility] is getting Depakote 250 mg twice daily with one extra dose of 125 daily.  Last dose yesterday at p.m. of 250 mg.  Dr. Otelia LimesLindzen recommends patient get 400 mg 3 times daily and follow-up with neurology on outpatient basis.   [MP]    Clinical Course User Index [MP] Arby BarrettePfeiffer, Areesha Dehaven, MD     Arby BarrettePfeiffer, Kashden Deboy, MD 07/24/17 2232

## 2017-07-24 NOTE — ED Notes (Signed)
Bed: WA20 Expected date:  Expected time:  Means of arrival:  Comments: EMS-SZ 
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
## 2017-07-24 NOTE — Discharge Instructions (Addendum)
1.  Patient's Depakote level is subtherapeutic at <10.  New prescription is written for Depakote 400 mg 3 times a day.  Follow-up with neurology as soon as possible.  Continue all of the other patients medications as prescribed. 2.  Patient's urinalysis has improved but still show signs of infection.  He was given an IV dose of Rocephin in the emergency department.  Continue the patient's Keflex.  Continue to monitor for any signs of increasing infection, repeat urinalysis in 2 days.

## 2017-07-24 NOTE — ED Notes (Signed)
[facility] called for transport.  

## 2017-07-24 NOTE — ED Triage Notes (Signed)
[facility] Pt has hx of seizures and reportedly has had 2 seizures today. Had an appt with his MD today for medication adjustment for his seizures but MD refused to see pt and said to send him to ER.

## 2017-07-25 DIAGNOSIS — R278 Other lack of coordination: Secondary | ICD-10-CM | POA: Diagnosis not present

## 2017-07-25 DIAGNOSIS — M79661 Pain in right lower leg: Secondary | ICD-10-CM | POA: Diagnosis not present

## 2017-07-25 DIAGNOSIS — R296 Repeated falls: Secondary | ICD-10-CM | POA: Diagnosis not present

## 2017-07-25 DIAGNOSIS — N390 Urinary tract infection, site not specified: Secondary | ICD-10-CM | POA: Diagnosis not present

## 2017-07-25 DIAGNOSIS — R1319 Other dysphagia: Secondary | ICD-10-CM | POA: Diagnosis not present

## 2017-07-25 DIAGNOSIS — M79662 Pain in left lower leg: Secondary | ICD-10-CM | POA: Diagnosis not present

## 2017-07-25 DIAGNOSIS — R2681 Unsteadiness on feet: Secondary | ICD-10-CM | POA: Diagnosis not present

## 2017-07-25 LAB — URINE CULTURE: Culture: 80000 — AB

## 2017-07-26 ENCOUNTER — Telehealth: Payer: Self-pay

## 2017-07-26 DIAGNOSIS — R296 Repeated falls: Secondary | ICD-10-CM | POA: Diagnosis not present

## 2017-07-26 DIAGNOSIS — M79662 Pain in left lower leg: Secondary | ICD-10-CM | POA: Diagnosis not present

## 2017-07-26 DIAGNOSIS — R1319 Other dysphagia: Secondary | ICD-10-CM | POA: Diagnosis not present

## 2017-07-26 DIAGNOSIS — M79661 Pain in right lower leg: Secondary | ICD-10-CM | POA: Diagnosis not present

## 2017-07-26 DIAGNOSIS — R2681 Unsteadiness on feet: Secondary | ICD-10-CM | POA: Diagnosis not present

## 2017-07-26 DIAGNOSIS — G894 Chronic pain syndrome: Secondary | ICD-10-CM | POA: Diagnosis not present

## 2017-07-26 DIAGNOSIS — R278 Other lack of coordination: Secondary | ICD-10-CM | POA: Diagnosis not present

## 2017-07-26 LAB — URINE CULTURE: Culture: 20000 — AB

## 2017-07-26 NOTE — Telephone Encounter (Signed)
Post ED Visit - Positive Culture Follow-up  Culture report reviewed by antimicrobial stewardship pharmacist:  []  Lee [name], Pharm.D. []  Lee [name], Pharm.D., BCPS AQ-ID []  Lee [name], Pharm.D., BCPS []  Lee [name], Pharm.D., BCPS []  Lee [name], [address]Pharm[address]D., BCPS, AAHIVP []  Lee [name], Pharm.D., BCPS, AAHIVP [x]  Lee [name], PharmD, BCPS []  Lee [name], PharmD, BCPS []  Lee [name], PharmD, BCPS []  Lee [name], PharmD  Positive urine culture Treated with Cephalexin, organism sensitive to the same and no further patient follow-up is required at this time.  Lee [name], Lee [name] 07/26/2017, 10:29 AM

## 2017-07-29 DIAGNOSIS — R2681 Unsteadiness on feet: Secondary | ICD-10-CM | POA: Diagnosis not present

## 2017-07-29 DIAGNOSIS — R296 Repeated falls: Secondary | ICD-10-CM | POA: Diagnosis not present

## 2017-07-29 DIAGNOSIS — M79662 Pain in left lower leg: Secondary | ICD-10-CM | POA: Diagnosis not present

## 2017-07-29 DIAGNOSIS — R1319 Other dysphagia: Secondary | ICD-10-CM | POA: Diagnosis not present

## 2017-07-29 DIAGNOSIS — R278 Other lack of coordination: Secondary | ICD-10-CM | POA: Diagnosis not present

## 2017-07-29 DIAGNOSIS — M79661 Pain in right lower leg: Secondary | ICD-10-CM | POA: Diagnosis not present

## 2017-07-30 DIAGNOSIS — R2681 Unsteadiness on feet: Secondary | ICD-10-CM | POA: Diagnosis not present

## 2017-07-30 DIAGNOSIS — R278 Other lack of coordination: Secondary | ICD-10-CM | POA: Diagnosis not present

## 2017-07-30 DIAGNOSIS — R296 Repeated falls: Secondary | ICD-10-CM | POA: Diagnosis not present

## 2017-07-30 DIAGNOSIS — M79662 Pain in left lower leg: Secondary | ICD-10-CM | POA: Diagnosis not present

## 2017-07-30 DIAGNOSIS — M79661 Pain in right lower leg: Secondary | ICD-10-CM | POA: Diagnosis not present

## 2017-07-30 DIAGNOSIS — R1319 Other dysphagia: Secondary | ICD-10-CM | POA: Diagnosis not present

## 2017-07-31 ENCOUNTER — Telehealth: Payer: Self-pay

## 2017-07-31 ENCOUNTER — Encounter: Payer: Self-pay | Admitting: Adult Health

## 2017-07-31 ENCOUNTER — Ambulatory Visit: Payer: Medicare Other | Admitting: Adult Health

## 2017-07-31 VITALS — BP 138/81 | HR 64 | Ht 68.0 in | Wt 146.6 lb

## 2017-07-31 DIAGNOSIS — R2681 Unsteadiness on feet: Secondary | ICD-10-CM | POA: Diagnosis not present

## 2017-07-31 DIAGNOSIS — R569 Unspecified convulsions: Secondary | ICD-10-CM | POA: Diagnosis not present

## 2017-07-31 DIAGNOSIS — R278 Other lack of coordination: Secondary | ICD-10-CM | POA: Diagnosis not present

## 2017-07-31 DIAGNOSIS — M79661 Pain in right lower leg: Secondary | ICD-10-CM | POA: Diagnosis not present

## 2017-07-31 DIAGNOSIS — Z5181 Encounter for therapeutic drug level monitoring: Secondary | ICD-10-CM

## 2017-07-31 DIAGNOSIS — R296 Repeated falls: Secondary | ICD-10-CM | POA: Diagnosis not present

## 2017-07-31 DIAGNOSIS — M79662 Pain in left lower leg: Secondary | ICD-10-CM | POA: Diagnosis not present

## 2017-07-31 DIAGNOSIS — R1319 Other dysphagia: Secondary | ICD-10-CM | POA: Diagnosis not present

## 2017-07-31 NOTE — Patient Instructions (Addendum)
Your Plan:  Continue Vimpat, Keppra and Carbmazepine Blood work today Must take medication as prescribed If your symptoms worsen or you develop new symptoms please let us know.   [facility]. I hope we have been able to provide you high quality care today.  You may receive a patient satisfaction survey over the next few weeks. We would appreciate your feedback and comments so that we may continue to improve ourselves and the health of our patients.

## 2017-07-31 NOTE — Telephone Encounter (Signed)
I have tried contacting [facility] four consecutive times in order to find out if patient has refused or missed any doses of medication since his hospitalization, but every time I was transferred to the nurses station the line just rings and eventually just starts beeping. I will try again at a later time.
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
## 2017-07-31 NOTE — Telephone Encounter (Signed)
I spoke with Fatmata at [facility] and she states that the patient is known to refuse his medications and some days they will have to wait until either his wife or brother are there and they can get him to take it. She reports that he has not taken his seizure medication twice since 07/25/17. Patient refused to take it today, however, when his brother arrived he was able to get the patient to take it.

## 2017-08-01 DIAGNOSIS — R278 Other lack of coordination: Secondary | ICD-10-CM | POA: Diagnosis not present

## 2017-08-01 DIAGNOSIS — M79661 Pain in right lower leg: Secondary | ICD-10-CM | POA: Diagnosis not present

## 2017-08-01 DIAGNOSIS — R1319 Other dysphagia: Secondary | ICD-10-CM | POA: Diagnosis not present

## 2017-08-01 DIAGNOSIS — M79662 Pain in left lower leg: Secondary | ICD-10-CM | POA: Diagnosis not present

## 2017-08-01 DIAGNOSIS — R2681 Unsteadiness on feet: Secondary | ICD-10-CM | POA: Diagnosis not present

## 2017-08-01 DIAGNOSIS — R296 Repeated falls: Secondary | ICD-10-CM | POA: Diagnosis not present

## 2017-08-01 NOTE — Progress Notes (Signed)
RN was able to get in touch with the facility.  She was advised that the patient also refuses his medication.  He has family there he sometimes will take it.  The nurse reported that since 718/19 he has not taken his medication twice a day.  She typically can get him to take it once.

## 2017-08-02 DIAGNOSIS — R1319 Other dysphagia: Secondary | ICD-10-CM | POA: Diagnosis not present

## 2017-08-02 DIAGNOSIS — R2681 Unsteadiness on feet: Secondary | ICD-10-CM | POA: Diagnosis not present

## 2017-08-02 DIAGNOSIS — R278 Other lack of coordination: Secondary | ICD-10-CM | POA: Diagnosis not present

## 2017-08-02 DIAGNOSIS — R296 Repeated falls: Secondary | ICD-10-CM | POA: Diagnosis not present

## 2017-08-02 DIAGNOSIS — M79661 Pain in right lower leg: Secondary | ICD-10-CM | POA: Diagnosis not present

## 2017-08-02 DIAGNOSIS — Z79899 Other long term (current) drug therapy: Secondary | ICD-10-CM | POA: Diagnosis not present

## 2017-08-02 DIAGNOSIS — M79662 Pain in left lower leg: Secondary | ICD-10-CM | POA: Diagnosis not present

## 2017-08-02 NOTE — Progress Notes (Signed)
I reviewed note and agree with plan.   Suanne MarkerVIKRAM R. Mattilyn Crites, MD 08/02/2017, 2:18 PM Certified in Neurology, Neurophysiology and Neuroimaging  [facility] [address], [address] [place], [place] [id number] [phone]

## 2017-08-03 LAB — CBC WITH DIFFERENTIAL/PLATELET
BASOS: 1 %
Basophils Absolute: 0 10*3/uL (ref 0.0–0.2)
EOS (ABSOLUTE): 0.2 10*3/uL (ref 0.0–0.4)
Eos: 2 %
Hematocrit: 44.6 % (ref 37.5–51.0)
Hemoglobin: 14.9 g/dL (ref 13.0–17.7)
IMMATURE GRANULOCYTES: 0 %
Immature Grans (Abs): 0 10*3/uL (ref 0.0–0.1)
Lymphocytes Absolute: 2.5 10*3/uL (ref 0.7–3.1)
Lymphs: 33 %
MCH: 32.1 pg (ref 26.6–33.0)
MCHC: 33.4 g/dL (ref 31.5–35.7)
MCV: 96 fL (ref 79–97)
MONOCYTES: 7 %
Monocytes Absolute: 0.5 10*3/uL (ref 0.1–0.9)
Neutrophils Absolute: 4.3 10*3/uL (ref 1.4–7.0)
Neutrophils: 57 %
Platelets: 383 10*3/uL (ref 150–450)
RBC: 4.64 x10E6/uL (ref 4.14–5.80)
RDW: 14.5 % (ref 12.3–15.4)
WBC: 7.6 10*3/uL (ref 3.4–10.8)

## 2017-08-03 LAB — COMPREHENSIVE METABOLIC PANEL
ALBUMIN: 4.4 g/dL (ref 3.6–4.8)
ALT: 24 IU/L (ref 0–44)
AST: 32 IU/L (ref 0–40)
Albumin/Globulin Ratio: 1.5 (ref 1.2–2.2)
Alkaline Phosphatase: 110 IU/L (ref 39–117)
BUN / CREAT RATIO: 13 (ref 10–24)
BUN: 9 mg/dL (ref 8–27)
CO2: 21 mmol/L (ref 20–29)
CREATININE: 0.72 mg/dL — AB (ref 0.76–1.27)
Calcium: 9.5 mg/dL (ref 8.6–10.2)
Chloride: 102 mmol/L (ref 96–106)
GFR calc non Af Amer: 96 mL/min/{1.73_m2} (ref >59)
GFR, EST AFRICAN AMERICAN: 111 mL/min/{1.73_m2} (ref >59)
GLOBULIN, TOTAL: 2.9 g/dL (ref 1.5–4.5)
Glucose: 88 mg/dL (ref 65–99)
Potassium: 4.6 mmol/L (ref 3.5–5.2)
SODIUM: 145 mmol/L — AB (ref 134–144)
TOTAL PROTEIN: 7.3 g/dL (ref 6.0–8.5)

## 2017-08-03 LAB — LEVETIRACETAM LEVEL

## 2017-08-03 LAB — VALPROIC ACID LEVEL: Valproic Acid Lvl: 8 ug/mL — ABNORMAL LOW (ref 50–100)

## 2017-08-03 LAB — LACOSAMIDE

## 2017-08-03 LAB — CARBAMAZEPINE LEVEL, TOTAL: Carbamazepine (Tegretol), S: 7.1 ug/mL (ref 4.0–12.0)

## 2017-08-07 DIAGNOSIS — R1319 Other dysphagia: Secondary | ICD-10-CM | POA: Diagnosis not present

## 2017-08-07 DIAGNOSIS — M79661 Pain in right lower leg: Secondary | ICD-10-CM | POA: Diagnosis not present

## 2017-08-07 DIAGNOSIS — M79662 Pain in left lower leg: Secondary | ICD-10-CM | POA: Diagnosis not present

## 2017-08-07 DIAGNOSIS — R296 Repeated falls: Secondary | ICD-10-CM | POA: Diagnosis not present

## 2017-08-07 DIAGNOSIS — R278 Other lack of coordination: Secondary | ICD-10-CM | POA: Diagnosis not present

## 2017-08-07 DIAGNOSIS — R2681 Unsteadiness on feet: Secondary | ICD-10-CM | POA: Diagnosis not present

## 2017-08-08 DIAGNOSIS — M79661 Pain in right lower leg: Secondary | ICD-10-CM | POA: Diagnosis not present

## 2017-08-08 DIAGNOSIS — R296 Repeated falls: Secondary | ICD-10-CM | POA: Diagnosis not present

## 2017-08-08 DIAGNOSIS — R1319 Other dysphagia: Secondary | ICD-10-CM | POA: Diagnosis not present

## 2017-08-08 DIAGNOSIS — R278 Other lack of coordination: Secondary | ICD-10-CM | POA: Diagnosis not present

## 2017-08-08 DIAGNOSIS — I739 Peripheral vascular disease, unspecified: Secondary | ICD-10-CM | POA: Diagnosis not present

## 2017-08-08 DIAGNOSIS — H5213 Myopia, bilateral: Secondary | ICD-10-CM | POA: Diagnosis not present

## 2017-08-08 DIAGNOSIS — Z9181 History of falling: Secondary | ICD-10-CM | POA: Diagnosis not present

## 2017-08-08 DIAGNOSIS — I10 Essential (primary) hypertension: Secondary | ICD-10-CM | POA: Diagnosis not present

## 2017-08-08 DIAGNOSIS — Z79899 Other long term (current) drug therapy: Secondary | ICD-10-CM | POA: Diagnosis not present

## 2017-08-08 DIAGNOSIS — M79662 Pain in left lower leg: Secondary | ICD-10-CM | POA: Diagnosis not present

## 2017-08-08 DIAGNOSIS — R2681 Unsteadiness on feet: Secondary | ICD-10-CM | POA: Diagnosis not present

## 2017-08-08 DIAGNOSIS — R451 Restlessness and agitation: Secondary | ICD-10-CM | POA: Diagnosis not present

## 2017-08-08 DIAGNOSIS — T1590XA Foreign body on external eye, part unspecified, unspecified eye, initial encounter: Secondary | ICD-10-CM | POA: Diagnosis not present

## 2017-08-12 DIAGNOSIS — I10 Essential (primary) hypertension: Secondary | ICD-10-CM | POA: Diagnosis not present

## 2017-08-12 DIAGNOSIS — I739 Peripheral vascular disease, unspecified: Secondary | ICD-10-CM | POA: Diagnosis not present

## 2017-08-14 ENCOUNTER — Telehealth: Payer: Self-pay | Admitting: *Deleted

## 2017-08-14 DIAGNOSIS — R569 Unspecified convulsions: Secondary | ICD-10-CM

## 2017-08-14 NOTE — Telephone Encounter (Signed)
-----   Message from Butch PennyMegan Millikan, NP sent at 08/12/2017  3:52 PM EDT ----- The keppra and vimpat levels were cancelled? Can we make sure this gets redrawn.

## 2017-08-14 NOTE — Telephone Encounter (Signed)
Spoke to Safeway IncPam, wife of pt.  I relayed that the lab results for the keppra and vimpat were cancelled due to not enough specimen to do the testing. Need to repeat.  [facility].  Verlee MonteDora is day RN  [phone] is there number.  LMVM for DON at [facility] Cindy Briggs, RN  Re: doing labs at [facility].

## 2017-08-15 NOTE — Telephone Encounter (Signed)
I LMVM for DON at [facility] to return call relating if ok to have lab work done at the facility.

## 2017-08-15 NOTE — Telephone Encounter (Signed)
Fax confirmation received for [facility]  [phone] for labs.

## 2017-08-15 NOTE — Telephone Encounter (Signed)
Aurelio BrashSpoke Cynthia, RN and she said they have phlebotomist come out three times week.  Can have done there no problem.  Needs order faxed to them at [phone].  Orders done, need signature.

## 2017-08-22 NOTE — Telephone Encounter (Signed)
I called [facility].  [phone].  LMVM for Noreene Larssonindy Briggs, DON.  Re: lab results on pt.

## 2017-08-27 DIAGNOSIS — R451 Restlessness and agitation: Secondary | ICD-10-CM | POA: Diagnosis not present

## 2017-09-06 DIAGNOSIS — T1590XA Foreign body on external eye, part unspecified, unspecified eye, initial encounter: Secondary | ICD-10-CM | POA: Diagnosis not present

## 2017-09-06 DIAGNOSIS — Z9181 History of falling: Secondary | ICD-10-CM | POA: Diagnosis not present

## 2017-09-09 DIAGNOSIS — R1319 Other dysphagia: Secondary | ICD-10-CM | POA: Diagnosis not present

## 2017-09-09 DIAGNOSIS — R278 Other lack of coordination: Secondary | ICD-10-CM | POA: Diagnosis not present

## 2017-09-09 DIAGNOSIS — M79662 Pain in left lower leg: Secondary | ICD-10-CM | POA: Diagnosis not present

## 2017-09-09 DIAGNOSIS — R296 Repeated falls: Secondary | ICD-10-CM | POA: Diagnosis not present

## 2017-09-09 DIAGNOSIS — M79661 Pain in right lower leg: Secondary | ICD-10-CM | POA: Diagnosis not present

## 2017-09-09 DIAGNOSIS — Z9181 History of falling: Secondary | ICD-10-CM | POA: Diagnosis not present

## 2017-09-09 DIAGNOSIS — R2681 Unsteadiness on feet: Secondary | ICD-10-CM | POA: Diagnosis not present

## 2017-09-10 DIAGNOSIS — R2681 Unsteadiness on feet: Secondary | ICD-10-CM | POA: Diagnosis not present

## 2017-09-10 DIAGNOSIS — R278 Other lack of coordination: Secondary | ICD-10-CM | POA: Diagnosis not present

## 2017-09-10 DIAGNOSIS — Z9181 History of falling: Secondary | ICD-10-CM | POA: Diagnosis not present

## 2017-09-10 DIAGNOSIS — M79662 Pain in left lower leg: Secondary | ICD-10-CM | POA: Diagnosis not present

## 2017-09-10 DIAGNOSIS — R1319 Other dysphagia: Secondary | ICD-10-CM | POA: Diagnosis not present

## 2017-09-10 DIAGNOSIS — M79661 Pain in right lower leg: Secondary | ICD-10-CM | POA: Diagnosis not present

## 2017-09-10 DIAGNOSIS — R296 Repeated falls: Secondary | ICD-10-CM | POA: Diagnosis not present

## 2017-09-13 DIAGNOSIS — Z79899 Other long term (current) drug therapy: Secondary | ICD-10-CM | POA: Diagnosis not present

## 2017-09-13 DIAGNOSIS — B351 Tinea unguium: Secondary | ICD-10-CM | POA: Diagnosis not present

## 2017-09-13 DIAGNOSIS — I739 Peripheral vascular disease, unspecified: Secondary | ICD-10-CM | POA: Diagnosis not present

## 2017-09-20 DIAGNOSIS — R1084 Generalized abdominal pain: Secondary | ICD-10-CM | POA: Diagnosis not present

## 2017-09-20 DIAGNOSIS — R109 Unspecified abdominal pain: Secondary | ICD-10-CM | POA: Diagnosis not present

## 2017-09-20 DIAGNOSIS — R531 Weakness: Secondary | ICD-10-CM | POA: Diagnosis not present

## 2017-09-20 DIAGNOSIS — H524 Presbyopia: Secondary | ICD-10-CM | POA: Diagnosis not present

## 2017-09-23 DIAGNOSIS — R3911 Hesitancy of micturition: Secondary | ICD-10-CM | POA: Diagnosis not present

## 2017-09-23 DIAGNOSIS — N390 Urinary tract infection, site not specified: Secondary | ICD-10-CM | POA: Diagnosis not present

## 2017-09-24 DIAGNOSIS — K5900 Constipation, unspecified: Secondary | ICD-10-CM | POA: Diagnosis not present

## 2017-09-24 DIAGNOSIS — N390 Urinary tract infection, site not specified: Secondary | ICD-10-CM | POA: Diagnosis not present

## 2017-09-26 DIAGNOSIS — N390 Urinary tract infection, site not specified: Secondary | ICD-10-CM | POA: Diagnosis not present

## 2017-10-28 DIAGNOSIS — Z87440 Personal history of urinary (tract) infections: Secondary | ICD-10-CM | POA: Diagnosis not present

## 2017-10-28 DIAGNOSIS — J449 Chronic obstructive pulmonary disease, unspecified: Secondary | ICD-10-CM | POA: Diagnosis not present

## 2017-11-20 NOTE — Telephone Encounter (Signed)
Called this am and spoke to med tech relating to pending results on keppra, vimpat levels done back in 08-15-17.  I donot see that we received.  I gave her phone and fax # to f/u.

## 2017-11-20 NOTE — Telephone Encounter (Signed)
Received lab results from 09/06/2017 and 09/13/2017 [facility] relating to depakote level on pt, no keppra level.  Other labs per [facility].  Being followed by MD at facility, Bernette RedbirdAmy Rosenthal.

## 2017-11-26 DIAGNOSIS — R410 Disorientation, unspecified: Secondary | ICD-10-CM | POA: Diagnosis not present

## 2017-11-26 NOTE — Telephone Encounter (Signed)
refaxed to [facility] request for lab results for lacosamide and levetiracetam from 08-15-17 request.

## 2017-11-27 DIAGNOSIS — Z79899 Other long term (current) drug therapy: Secondary | ICD-10-CM | POA: Diagnosis not present

## 2017-11-27 DIAGNOSIS — N390 Urinary tract infection, site not specified: Secondary | ICD-10-CM | POA: Diagnosis not present

## 2017-11-28 NOTE — Telephone Encounter (Addendum)
I did not hear from [facility].  I called the lab that [facility] uses.  Spoke to [place] at [facility].  No keppra or vimpat levels.  Did you want to try to get again?  Pt has appt in 2/3 2020.

## 2017-11-30 DIAGNOSIS — Z9181 History of falling: Secondary | ICD-10-CM | POA: Diagnosis not present

## 2017-11-30 DIAGNOSIS — R296 Repeated falls: Secondary | ICD-10-CM | POA: Diagnosis not present

## 2017-11-30 DIAGNOSIS — M79661 Pain in right lower leg: Secondary | ICD-10-CM | POA: Diagnosis not present

## 2017-11-30 DIAGNOSIS — R278 Other lack of coordination: Secondary | ICD-10-CM | POA: Diagnosis not present

## 2017-11-30 DIAGNOSIS — M79662 Pain in left lower leg: Secondary | ICD-10-CM | POA: Diagnosis not present

## 2017-11-30 DIAGNOSIS — R1319 Other dysphagia: Secondary | ICD-10-CM | POA: Diagnosis not present

## 2017-11-30 DIAGNOSIS — R2681 Unsteadiness on feet: Secondary | ICD-10-CM | POA: Diagnosis not present

## 2017-12-02 DIAGNOSIS — R1319 Other dysphagia: Secondary | ICD-10-CM | POA: Diagnosis not present

## 2017-12-02 DIAGNOSIS — M79662 Pain in left lower leg: Secondary | ICD-10-CM | POA: Diagnosis not present

## 2017-12-02 DIAGNOSIS — Z9181 History of falling: Secondary | ICD-10-CM | POA: Diagnosis not present

## 2017-12-02 DIAGNOSIS — M79661 Pain in right lower leg: Secondary | ICD-10-CM | POA: Diagnosis not present

## 2017-12-02 DIAGNOSIS — R2681 Unsteadiness on feet: Secondary | ICD-10-CM | POA: Diagnosis not present

## 2017-12-02 DIAGNOSIS — R296 Repeated falls: Secondary | ICD-10-CM | POA: Diagnosis not present

## 2017-12-02 DIAGNOSIS — R278 Other lack of coordination: Secondary | ICD-10-CM | POA: Diagnosis not present

## 2017-12-02 NOTE — Telephone Encounter (Addendum)
I spoke to Romatu, one of the RN at [facility].  She relayed that pt did have seizures about 2 wks ago.  The facility MD did labs 11/27/2017 (BMP, CBC, UA, VPA (36)  Levetirecetam level (14.6). No lacosamide level done.   She said she would fax to me at [phone]. At your desk.  Next RV 02/2018.

## 2017-12-02 NOTE — Telephone Encounter (Signed)
Please call and find out if he has had any seizure events.

## 2017-12-03 DIAGNOSIS — R2681 Unsteadiness on feet: Secondary | ICD-10-CM | POA: Diagnosis not present

## 2017-12-03 DIAGNOSIS — M79661 Pain in right lower leg: Secondary | ICD-10-CM | POA: Diagnosis not present

## 2017-12-03 DIAGNOSIS — R278 Other lack of coordination: Secondary | ICD-10-CM | POA: Diagnosis not present

## 2017-12-03 DIAGNOSIS — M79662 Pain in left lower leg: Secondary | ICD-10-CM | POA: Diagnosis not present

## 2017-12-03 DIAGNOSIS — R296 Repeated falls: Secondary | ICD-10-CM | POA: Diagnosis not present

## 2017-12-03 DIAGNOSIS — R1319 Other dysphagia: Secondary | ICD-10-CM | POA: Diagnosis not present

## 2017-12-03 DIAGNOSIS — Z9181 History of falling: Secondary | ICD-10-CM | POA: Diagnosis not present

## 2017-12-04 NOTE — Telephone Encounter (Signed)
Depakote level is low. [facility] if [facility] is refusing medication? We could do sprinkles of depakote if he would eat it all?

## 2017-12-09 NOTE — Telephone Encounter (Signed)
I know but its being used for seizure control as well.

## 2017-12-10 NOTE — Telephone Encounter (Signed)
I spoke to [employer]ARA, Charity fundraiserN at [facility].  She stated that he does refuse taking his medication she stated once to twice weekly. I mentioned that last labs he had low VPA level, and if refusing meds would depakote sprinkles be a better option to try for him?  She said that will place this request/information in the MD communications book and she thought it was an idea to try.  She would get back to us, I gave her our number and my name.

## 2017-12-10 NOTE — Telephone Encounter (Signed)
Thank you :)

## 2017-12-11 DIAGNOSIS — R2681 Unsteadiness on feet: Secondary | ICD-10-CM | POA: Diagnosis not present

## 2017-12-11 DIAGNOSIS — Z9181 History of falling: Secondary | ICD-10-CM | POA: Diagnosis not present

## 2017-12-11 DIAGNOSIS — Z043 Encounter for examination and observation following other accident: Secondary | ICD-10-CM | POA: Diagnosis not present

## 2017-12-11 DIAGNOSIS — R296 Repeated falls: Secondary | ICD-10-CM | POA: Diagnosis not present

## 2017-12-11 DIAGNOSIS — M79662 Pain in left lower leg: Secondary | ICD-10-CM | POA: Diagnosis not present

## 2017-12-11 DIAGNOSIS — R1319 Other dysphagia: Secondary | ICD-10-CM | POA: Diagnosis not present

## 2017-12-11 DIAGNOSIS — M79661 Pain in right lower leg: Secondary | ICD-10-CM | POA: Diagnosis not present

## 2017-12-11 DIAGNOSIS — R278 Other lack of coordination: Secondary | ICD-10-CM | POA: Diagnosis not present

## 2017-12-11 DIAGNOSIS — R2689 Other abnormalities of gait and mobility: Secondary | ICD-10-CM | POA: Diagnosis not present

## 2017-12-12 DIAGNOSIS — Z9181 History of falling: Secondary | ICD-10-CM | POA: Diagnosis not present

## 2017-12-12 DIAGNOSIS — R2681 Unsteadiness on feet: Secondary | ICD-10-CM | POA: Diagnosis not present

## 2017-12-12 DIAGNOSIS — M79661 Pain in right lower leg: Secondary | ICD-10-CM | POA: Diagnosis not present

## 2017-12-12 DIAGNOSIS — R278 Other lack of coordination: Secondary | ICD-10-CM | POA: Diagnosis not present

## 2017-12-12 DIAGNOSIS — R1319 Other dysphagia: Secondary | ICD-10-CM | POA: Diagnosis not present

## 2017-12-12 DIAGNOSIS — M79662 Pain in left lower leg: Secondary | ICD-10-CM | POA: Diagnosis not present

## 2017-12-12 DIAGNOSIS — R2689 Other abnormalities of gait and mobility: Secondary | ICD-10-CM | POA: Diagnosis not present

## 2017-12-12 DIAGNOSIS — R296 Repeated falls: Secondary | ICD-10-CM | POA: Diagnosis not present

## 2017-12-13 DIAGNOSIS — R1319 Other dysphagia: Secondary | ICD-10-CM | POA: Diagnosis not present

## 2017-12-13 DIAGNOSIS — R2681 Unsteadiness on feet: Secondary | ICD-10-CM | POA: Diagnosis not present

## 2017-12-13 DIAGNOSIS — R296 Repeated falls: Secondary | ICD-10-CM | POA: Diagnosis not present

## 2017-12-13 DIAGNOSIS — M79661 Pain in right lower leg: Secondary | ICD-10-CM | POA: Diagnosis not present

## 2017-12-13 DIAGNOSIS — R278 Other lack of coordination: Secondary | ICD-10-CM | POA: Diagnosis not present

## 2017-12-13 DIAGNOSIS — Z9181 History of falling: Secondary | ICD-10-CM | POA: Diagnosis not present

## 2017-12-13 DIAGNOSIS — R2689 Other abnormalities of gait and mobility: Secondary | ICD-10-CM | POA: Diagnosis not present

## 2017-12-13 DIAGNOSIS — M79662 Pain in left lower leg: Secondary | ICD-10-CM | POA: Diagnosis not present

## 2017-12-14 DIAGNOSIS — Z9181 History of falling: Secondary | ICD-10-CM | POA: Diagnosis not present

## 2017-12-14 DIAGNOSIS — R2681 Unsteadiness on feet: Secondary | ICD-10-CM | POA: Diagnosis not present

## 2017-12-14 DIAGNOSIS — M79661 Pain in right lower leg: Secondary | ICD-10-CM | POA: Diagnosis not present

## 2017-12-14 DIAGNOSIS — R1319 Other dysphagia: Secondary | ICD-10-CM | POA: Diagnosis not present

## 2017-12-14 DIAGNOSIS — R278 Other lack of coordination: Secondary | ICD-10-CM | POA: Diagnosis not present

## 2017-12-14 DIAGNOSIS — R2689 Other abnormalities of gait and mobility: Secondary | ICD-10-CM | POA: Diagnosis not present

## 2017-12-14 DIAGNOSIS — M79662 Pain in left lower leg: Secondary | ICD-10-CM | POA: Diagnosis not present

## 2017-12-14 DIAGNOSIS — R296 Repeated falls: Secondary | ICD-10-CM | POA: Diagnosis not present

## 2017-12-16 DIAGNOSIS — R2689 Other abnormalities of gait and mobility: Secondary | ICD-10-CM | POA: Diagnosis not present

## 2017-12-16 DIAGNOSIS — R1319 Other dysphagia: Secondary | ICD-10-CM | POA: Diagnosis not present

## 2017-12-16 DIAGNOSIS — R296 Repeated falls: Secondary | ICD-10-CM | POA: Diagnosis not present

## 2017-12-16 DIAGNOSIS — R2681 Unsteadiness on feet: Secondary | ICD-10-CM | POA: Diagnosis not present

## 2017-12-16 DIAGNOSIS — Z9181 History of falling: Secondary | ICD-10-CM | POA: Diagnosis not present

## 2017-12-16 DIAGNOSIS — R278 Other lack of coordination: Secondary | ICD-10-CM | POA: Diagnosis not present

## 2017-12-16 DIAGNOSIS — M79661 Pain in right lower leg: Secondary | ICD-10-CM | POA: Diagnosis not present

## 2017-12-16 DIAGNOSIS — Z79899 Other long term (current) drug therapy: Secondary | ICD-10-CM | POA: Diagnosis not present

## 2017-12-16 DIAGNOSIS — M79662 Pain in left lower leg: Secondary | ICD-10-CM | POA: Diagnosis not present

## 2017-12-17 DIAGNOSIS — Z9181 History of falling: Secondary | ICD-10-CM | POA: Diagnosis not present

## 2017-12-17 DIAGNOSIS — J449 Chronic obstructive pulmonary disease, unspecified: Secondary | ICD-10-CM | POA: Diagnosis not present

## 2017-12-17 DIAGNOSIS — I10 Essential (primary) hypertension: Secondary | ICD-10-CM | POA: Diagnosis not present

## 2017-12-17 DIAGNOSIS — R2689 Other abnormalities of gait and mobility: Secondary | ICD-10-CM | POA: Diagnosis not present

## 2017-12-17 DIAGNOSIS — R1319 Other dysphagia: Secondary | ICD-10-CM | POA: Diagnosis not present

## 2017-12-17 DIAGNOSIS — M79661 Pain in right lower leg: Secondary | ICD-10-CM | POA: Diagnosis not present

## 2017-12-17 DIAGNOSIS — R2681 Unsteadiness on feet: Secondary | ICD-10-CM | POA: Diagnosis not present

## 2017-12-17 DIAGNOSIS — M79662 Pain in left lower leg: Secondary | ICD-10-CM | POA: Diagnosis not present

## 2017-12-17 DIAGNOSIS — R278 Other lack of coordination: Secondary | ICD-10-CM | POA: Diagnosis not present

## 2017-12-17 DIAGNOSIS — R296 Repeated falls: Secondary | ICD-10-CM | POA: Diagnosis not present

## 2017-12-18 DIAGNOSIS — R2689 Other abnormalities of gait and mobility: Secondary | ICD-10-CM | POA: Diagnosis not present

## 2017-12-18 DIAGNOSIS — M79662 Pain in left lower leg: Secondary | ICD-10-CM | POA: Diagnosis not present

## 2017-12-18 DIAGNOSIS — R2681 Unsteadiness on feet: Secondary | ICD-10-CM | POA: Diagnosis not present

## 2017-12-18 DIAGNOSIS — Z9181 History of falling: Secondary | ICD-10-CM | POA: Diagnosis not present

## 2017-12-18 DIAGNOSIS — M79661 Pain in right lower leg: Secondary | ICD-10-CM | POA: Diagnosis not present

## 2017-12-18 DIAGNOSIS — R1319 Other dysphagia: Secondary | ICD-10-CM | POA: Diagnosis not present

## 2017-12-18 DIAGNOSIS — R278 Other lack of coordination: Secondary | ICD-10-CM | POA: Diagnosis not present

## 2017-12-18 DIAGNOSIS — R296 Repeated falls: Secondary | ICD-10-CM | POA: Diagnosis not present

## 2017-12-19 DIAGNOSIS — Z9181 History of falling: Secondary | ICD-10-CM | POA: Diagnosis not present

## 2017-12-19 DIAGNOSIS — R296 Repeated falls: Secondary | ICD-10-CM | POA: Diagnosis not present

## 2017-12-19 DIAGNOSIS — R2681 Unsteadiness on feet: Secondary | ICD-10-CM | POA: Diagnosis not present

## 2017-12-19 DIAGNOSIS — R2689 Other abnormalities of gait and mobility: Secondary | ICD-10-CM | POA: Diagnosis not present

## 2017-12-19 DIAGNOSIS — R278 Other lack of coordination: Secondary | ICD-10-CM | POA: Diagnosis not present

## 2017-12-19 DIAGNOSIS — M79661 Pain in right lower leg: Secondary | ICD-10-CM | POA: Diagnosis not present

## 2017-12-19 DIAGNOSIS — R1319 Other dysphagia: Secondary | ICD-10-CM | POA: Diagnosis not present

## 2017-12-19 DIAGNOSIS — M79662 Pain in left lower leg: Secondary | ICD-10-CM | POA: Diagnosis not present

## 2017-12-20 DIAGNOSIS — R2681 Unsteadiness on feet: Secondary | ICD-10-CM | POA: Diagnosis not present

## 2017-12-20 DIAGNOSIS — Z9181 History of falling: Secondary | ICD-10-CM | POA: Diagnosis not present

## 2017-12-20 DIAGNOSIS — R296 Repeated falls: Secondary | ICD-10-CM | POA: Diagnosis not present

## 2017-12-20 DIAGNOSIS — M79662 Pain in left lower leg: Secondary | ICD-10-CM | POA: Diagnosis not present

## 2017-12-20 DIAGNOSIS — R278 Other lack of coordination: Secondary | ICD-10-CM | POA: Diagnosis not present

## 2017-12-20 DIAGNOSIS — M79661 Pain in right lower leg: Secondary | ICD-10-CM | POA: Diagnosis not present

## 2017-12-20 DIAGNOSIS — R1319 Other dysphagia: Secondary | ICD-10-CM | POA: Diagnosis not present

## 2017-12-20 DIAGNOSIS — R2689 Other abnormalities of gait and mobility: Secondary | ICD-10-CM | POA: Diagnosis not present

## 2017-12-21 DIAGNOSIS — R278 Other lack of coordination: Secondary | ICD-10-CM | POA: Diagnosis not present

## 2017-12-21 DIAGNOSIS — R2681 Unsteadiness on feet: Secondary | ICD-10-CM | POA: Diagnosis not present

## 2017-12-21 DIAGNOSIS — R296 Repeated falls: Secondary | ICD-10-CM | POA: Diagnosis not present

## 2017-12-21 DIAGNOSIS — R2689 Other abnormalities of gait and mobility: Secondary | ICD-10-CM | POA: Diagnosis not present

## 2017-12-21 DIAGNOSIS — M79661 Pain in right lower leg: Secondary | ICD-10-CM | POA: Diagnosis not present

## 2017-12-21 DIAGNOSIS — M79662 Pain in left lower leg: Secondary | ICD-10-CM | POA: Diagnosis not present

## 2017-12-21 DIAGNOSIS — R1319 Other dysphagia: Secondary | ICD-10-CM | POA: Diagnosis not present

## 2017-12-21 DIAGNOSIS — Z9181 History of falling: Secondary | ICD-10-CM | POA: Diagnosis not present

## 2017-12-23 DIAGNOSIS — M79661 Pain in right lower leg: Secondary | ICD-10-CM | POA: Diagnosis not present

## 2017-12-23 DIAGNOSIS — R2681 Unsteadiness on feet: Secondary | ICD-10-CM | POA: Diagnosis not present

## 2017-12-23 DIAGNOSIS — R1319 Other dysphagia: Secondary | ICD-10-CM | POA: Diagnosis not present

## 2017-12-23 DIAGNOSIS — R278 Other lack of coordination: Secondary | ICD-10-CM | POA: Diagnosis not present

## 2017-12-23 DIAGNOSIS — R2689 Other abnormalities of gait and mobility: Secondary | ICD-10-CM | POA: Diagnosis not present

## 2017-12-23 DIAGNOSIS — R296 Repeated falls: Secondary | ICD-10-CM | POA: Diagnosis not present

## 2017-12-23 DIAGNOSIS — M79662 Pain in left lower leg: Secondary | ICD-10-CM | POA: Diagnosis not present

## 2017-12-23 DIAGNOSIS — Z9181 History of falling: Secondary | ICD-10-CM | POA: Diagnosis not present

## 2017-12-24 DIAGNOSIS — R1319 Other dysphagia: Secondary | ICD-10-CM | POA: Diagnosis not present

## 2017-12-24 DIAGNOSIS — Z9181 History of falling: Secondary | ICD-10-CM | POA: Diagnosis not present

## 2017-12-24 DIAGNOSIS — R296 Repeated falls: Secondary | ICD-10-CM | POA: Diagnosis not present

## 2017-12-24 DIAGNOSIS — R278 Other lack of coordination: Secondary | ICD-10-CM | POA: Diagnosis not present

## 2017-12-24 DIAGNOSIS — J449 Chronic obstructive pulmonary disease, unspecified: Secondary | ICD-10-CM | POA: Diagnosis not present

## 2017-12-24 DIAGNOSIS — M79661 Pain in right lower leg: Secondary | ICD-10-CM | POA: Diagnosis not present

## 2017-12-24 DIAGNOSIS — R2689 Other abnormalities of gait and mobility: Secondary | ICD-10-CM | POA: Diagnosis not present

## 2017-12-24 DIAGNOSIS — M79662 Pain in left lower leg: Secondary | ICD-10-CM | POA: Diagnosis not present

## 2017-12-24 DIAGNOSIS — I10 Essential (primary) hypertension: Secondary | ICD-10-CM | POA: Diagnosis not present

## 2017-12-24 DIAGNOSIS — R2681 Unsteadiness on feet: Secondary | ICD-10-CM | POA: Diagnosis not present

## 2017-12-25 DIAGNOSIS — M79661 Pain in right lower leg: Secondary | ICD-10-CM | POA: Diagnosis not present

## 2017-12-25 DIAGNOSIS — M79662 Pain in left lower leg: Secondary | ICD-10-CM | POA: Diagnosis not present

## 2017-12-25 DIAGNOSIS — R1319 Other dysphagia: Secondary | ICD-10-CM | POA: Diagnosis not present

## 2017-12-25 DIAGNOSIS — R296 Repeated falls: Secondary | ICD-10-CM | POA: Diagnosis not present

## 2017-12-25 DIAGNOSIS — Z9181 History of falling: Secondary | ICD-10-CM | POA: Diagnosis not present

## 2017-12-25 DIAGNOSIS — R2689 Other abnormalities of gait and mobility: Secondary | ICD-10-CM | POA: Diagnosis not present

## 2017-12-25 DIAGNOSIS — R278 Other lack of coordination: Secondary | ICD-10-CM | POA: Diagnosis not present

## 2017-12-25 DIAGNOSIS — R2681 Unsteadiness on feet: Secondary | ICD-10-CM | POA: Diagnosis not present

## 2017-12-26 DIAGNOSIS — M79661 Pain in right lower leg: Secondary | ICD-10-CM | POA: Diagnosis not present

## 2017-12-26 DIAGNOSIS — R2681 Unsteadiness on feet: Secondary | ICD-10-CM | POA: Diagnosis not present

## 2017-12-26 DIAGNOSIS — R1319 Other dysphagia: Secondary | ICD-10-CM | POA: Diagnosis not present

## 2017-12-26 DIAGNOSIS — Z9181 History of falling: Secondary | ICD-10-CM | POA: Diagnosis not present

## 2017-12-26 DIAGNOSIS — M79662 Pain in left lower leg: Secondary | ICD-10-CM | POA: Diagnosis not present

## 2017-12-26 DIAGNOSIS — R296 Repeated falls: Secondary | ICD-10-CM | POA: Diagnosis not present

## 2017-12-26 DIAGNOSIS — R2689 Other abnormalities of gait and mobility: Secondary | ICD-10-CM | POA: Diagnosis not present

## 2017-12-26 DIAGNOSIS — R278 Other lack of coordination: Secondary | ICD-10-CM | POA: Diagnosis not present

## 2017-12-27 DIAGNOSIS — R296 Repeated falls: Secondary | ICD-10-CM | POA: Diagnosis not present

## 2017-12-27 DIAGNOSIS — R1319 Other dysphagia: Secondary | ICD-10-CM | POA: Diagnosis not present

## 2017-12-27 DIAGNOSIS — M79662 Pain in left lower leg: Secondary | ICD-10-CM | POA: Diagnosis not present

## 2017-12-27 DIAGNOSIS — R2681 Unsteadiness on feet: Secondary | ICD-10-CM | POA: Diagnosis not present

## 2017-12-27 DIAGNOSIS — R278 Other lack of coordination: Secondary | ICD-10-CM | POA: Diagnosis not present

## 2017-12-27 DIAGNOSIS — Z9181 History of falling: Secondary | ICD-10-CM | POA: Diagnosis not present

## 2017-12-27 DIAGNOSIS — R2689 Other abnormalities of gait and mobility: Secondary | ICD-10-CM | POA: Diagnosis not present

## 2017-12-27 DIAGNOSIS — M79661 Pain in right lower leg: Secondary | ICD-10-CM | POA: Diagnosis not present

## 2017-12-28 DIAGNOSIS — R1319 Other dysphagia: Secondary | ICD-10-CM | POA: Diagnosis not present

## 2017-12-28 DIAGNOSIS — R278 Other lack of coordination: Secondary | ICD-10-CM | POA: Diagnosis not present

## 2017-12-28 DIAGNOSIS — R2689 Other abnormalities of gait and mobility: Secondary | ICD-10-CM | POA: Diagnosis not present

## 2017-12-28 DIAGNOSIS — M79661 Pain in right lower leg: Secondary | ICD-10-CM | POA: Diagnosis not present

## 2017-12-28 DIAGNOSIS — R2681 Unsteadiness on feet: Secondary | ICD-10-CM | POA: Diagnosis not present

## 2017-12-28 DIAGNOSIS — Z9181 History of falling: Secondary | ICD-10-CM | POA: Diagnosis not present

## 2017-12-28 DIAGNOSIS — R296 Repeated falls: Secondary | ICD-10-CM | POA: Diagnosis not present

## 2017-12-28 DIAGNOSIS — M79662 Pain in left lower leg: Secondary | ICD-10-CM | POA: Diagnosis not present

## 2017-12-29 DIAGNOSIS — R278 Other lack of coordination: Secondary | ICD-10-CM | POA: Diagnosis not present

## 2017-12-29 DIAGNOSIS — R2681 Unsteadiness on feet: Secondary | ICD-10-CM | POA: Diagnosis not present

## 2017-12-29 DIAGNOSIS — R296 Repeated falls: Secondary | ICD-10-CM | POA: Diagnosis not present

## 2017-12-29 DIAGNOSIS — Z9181 History of falling: Secondary | ICD-10-CM | POA: Diagnosis not present

## 2017-12-29 DIAGNOSIS — R1319 Other dysphagia: Secondary | ICD-10-CM | POA: Diagnosis not present

## 2017-12-29 DIAGNOSIS — R2689 Other abnormalities of gait and mobility: Secondary | ICD-10-CM | POA: Diagnosis not present

## 2017-12-29 DIAGNOSIS — M79661 Pain in right lower leg: Secondary | ICD-10-CM | POA: Diagnosis not present

## 2017-12-29 DIAGNOSIS — M79662 Pain in left lower leg: Secondary | ICD-10-CM | POA: Diagnosis not present

## 2017-12-30 DIAGNOSIS — M79661 Pain in right lower leg: Secondary | ICD-10-CM | POA: Diagnosis not present

## 2017-12-30 DIAGNOSIS — R296 Repeated falls: Secondary | ICD-10-CM | POA: Diagnosis not present

## 2017-12-30 DIAGNOSIS — R2681 Unsteadiness on feet: Secondary | ICD-10-CM | POA: Diagnosis not present

## 2017-12-30 DIAGNOSIS — R278 Other lack of coordination: Secondary | ICD-10-CM | POA: Diagnosis not present

## 2017-12-30 DIAGNOSIS — Z9181 History of falling: Secondary | ICD-10-CM | POA: Diagnosis not present

## 2017-12-30 DIAGNOSIS — R2689 Other abnormalities of gait and mobility: Secondary | ICD-10-CM | POA: Diagnosis not present

## 2017-12-30 DIAGNOSIS — R1319 Other dysphagia: Secondary | ICD-10-CM | POA: Diagnosis not present

## 2017-12-30 DIAGNOSIS — M79662 Pain in left lower leg: Secondary | ICD-10-CM | POA: Diagnosis not present

## 2018-01-09 DIAGNOSIS — R1319 Other dysphagia: Secondary | ICD-10-CM | POA: Diagnosis not present

## 2018-01-09 DIAGNOSIS — R296 Repeated falls: Secondary | ICD-10-CM | POA: Diagnosis not present

## 2018-01-09 DIAGNOSIS — R2689 Other abnormalities of gait and mobility: Secondary | ICD-10-CM | POA: Diagnosis not present

## 2018-01-09 DIAGNOSIS — R2681 Unsteadiness on feet: Secondary | ICD-10-CM | POA: Diagnosis not present

## 2018-01-09 DIAGNOSIS — Z9181 History of falling: Secondary | ICD-10-CM | POA: Diagnosis not present

## 2018-01-09 DIAGNOSIS — R278 Other lack of coordination: Secondary | ICD-10-CM | POA: Diagnosis not present

## 2018-01-09 DIAGNOSIS — M79661 Pain in right lower leg: Secondary | ICD-10-CM | POA: Diagnosis not present

## 2018-01-09 DIAGNOSIS — M79662 Pain in left lower leg: Secondary | ICD-10-CM | POA: Diagnosis not present

## 2018-01-10 DIAGNOSIS — R2689 Other abnormalities of gait and mobility: Secondary | ICD-10-CM | POA: Diagnosis not present

## 2018-01-10 DIAGNOSIS — I10 Essential (primary) hypertension: Secondary | ICD-10-CM | POA: Diagnosis not present

## 2018-01-10 DIAGNOSIS — M79662 Pain in left lower leg: Secondary | ICD-10-CM | POA: Diagnosis not present

## 2018-01-10 DIAGNOSIS — Z713 Dietary counseling and surveillance: Secondary | ICD-10-CM | POA: Diagnosis not present

## 2018-01-10 DIAGNOSIS — R296 Repeated falls: Secondary | ICD-10-CM | POA: Diagnosis not present

## 2018-01-10 DIAGNOSIS — R2681 Unsteadiness on feet: Secondary | ICD-10-CM | POA: Diagnosis not present

## 2018-01-10 DIAGNOSIS — R1319 Other dysphagia: Secondary | ICD-10-CM | POA: Diagnosis not present

## 2018-01-10 DIAGNOSIS — Z9181 History of falling: Secondary | ICD-10-CM | POA: Diagnosis not present

## 2018-01-10 DIAGNOSIS — M79661 Pain in right lower leg: Secondary | ICD-10-CM | POA: Diagnosis not present

## 2018-01-10 DIAGNOSIS — R278 Other lack of coordination: Secondary | ICD-10-CM | POA: Diagnosis not present

## 2018-01-11 DIAGNOSIS — Z9181 History of falling: Secondary | ICD-10-CM | POA: Diagnosis not present

## 2018-01-11 DIAGNOSIS — R1319 Other dysphagia: Secondary | ICD-10-CM | POA: Diagnosis not present

## 2018-01-11 DIAGNOSIS — R296 Repeated falls: Secondary | ICD-10-CM | POA: Diagnosis not present

## 2018-01-11 DIAGNOSIS — R2681 Unsteadiness on feet: Secondary | ICD-10-CM | POA: Diagnosis not present

## 2018-01-11 DIAGNOSIS — M79662 Pain in left lower leg: Secondary | ICD-10-CM | POA: Diagnosis not present

## 2018-01-11 DIAGNOSIS — R2689 Other abnormalities of gait and mobility: Secondary | ICD-10-CM | POA: Diagnosis not present

## 2018-01-11 DIAGNOSIS — R278 Other lack of coordination: Secondary | ICD-10-CM | POA: Diagnosis not present

## 2018-01-11 DIAGNOSIS — M79661 Pain in right lower leg: Secondary | ICD-10-CM | POA: Diagnosis not present

## 2018-01-13 DIAGNOSIS — R296 Repeated falls: Secondary | ICD-10-CM | POA: Diagnosis not present

## 2018-01-13 DIAGNOSIS — M79662 Pain in left lower leg: Secondary | ICD-10-CM | POA: Diagnosis not present

## 2018-01-13 DIAGNOSIS — R2681 Unsteadiness on feet: Secondary | ICD-10-CM | POA: Diagnosis not present

## 2018-01-13 DIAGNOSIS — R1319 Other dysphagia: Secondary | ICD-10-CM | POA: Diagnosis not present

## 2018-01-13 DIAGNOSIS — M79661 Pain in right lower leg: Secondary | ICD-10-CM | POA: Diagnosis not present

## 2018-01-13 DIAGNOSIS — R278 Other lack of coordination: Secondary | ICD-10-CM | POA: Diagnosis not present

## 2018-01-13 DIAGNOSIS — R2689 Other abnormalities of gait and mobility: Secondary | ICD-10-CM | POA: Diagnosis not present

## 2018-01-13 DIAGNOSIS — Z9181 History of falling: Secondary | ICD-10-CM | POA: Diagnosis not present

## 2018-01-14 DIAGNOSIS — R278 Other lack of coordination: Secondary | ICD-10-CM | POA: Diagnosis not present

## 2018-01-14 DIAGNOSIS — M79661 Pain in right lower leg: Secondary | ICD-10-CM | POA: Diagnosis not present

## 2018-01-14 DIAGNOSIS — R1319 Other dysphagia: Secondary | ICD-10-CM | POA: Diagnosis not present

## 2018-01-14 DIAGNOSIS — Z9181 History of falling: Secondary | ICD-10-CM | POA: Diagnosis not present

## 2018-01-14 DIAGNOSIS — M79662 Pain in left lower leg: Secondary | ICD-10-CM | POA: Diagnosis not present

## 2018-01-14 DIAGNOSIS — R2689 Other abnormalities of gait and mobility: Secondary | ICD-10-CM | POA: Diagnosis not present

## 2018-01-14 DIAGNOSIS — R296 Repeated falls: Secondary | ICD-10-CM | POA: Diagnosis not present

## 2018-01-14 DIAGNOSIS — R2681 Unsteadiness on feet: Secondary | ICD-10-CM | POA: Diagnosis not present

## 2018-01-17 DIAGNOSIS — Z9114 Patient's other noncompliance with medication regimen: Secondary | ICD-10-CM | POA: Diagnosis not present

## 2018-01-22 DIAGNOSIS — R2689 Other abnormalities of gait and mobility: Secondary | ICD-10-CM | POA: Diagnosis not present

## 2018-01-22 DIAGNOSIS — R296 Repeated falls: Secondary | ICD-10-CM | POA: Diagnosis not present

## 2018-01-22 DIAGNOSIS — M79662 Pain in left lower leg: Secondary | ICD-10-CM | POA: Diagnosis not present

## 2018-01-22 DIAGNOSIS — Z9181 History of falling: Secondary | ICD-10-CM | POA: Diagnosis not present

## 2018-01-22 DIAGNOSIS — R2681 Unsteadiness on feet: Secondary | ICD-10-CM | POA: Diagnosis not present

## 2018-01-22 DIAGNOSIS — R1319 Other dysphagia: Secondary | ICD-10-CM | POA: Diagnosis not present

## 2018-01-22 DIAGNOSIS — R278 Other lack of coordination: Secondary | ICD-10-CM | POA: Diagnosis not present

## 2018-01-22 DIAGNOSIS — M79661 Pain in right lower leg: Secondary | ICD-10-CM | POA: Diagnosis not present

## 2018-01-23 DIAGNOSIS — Z9181 History of falling: Secondary | ICD-10-CM | POA: Diagnosis not present

## 2018-01-23 DIAGNOSIS — R2681 Unsteadiness on feet: Secondary | ICD-10-CM | POA: Diagnosis not present

## 2018-01-23 DIAGNOSIS — M79662 Pain in left lower leg: Secondary | ICD-10-CM | POA: Diagnosis not present

## 2018-01-23 DIAGNOSIS — R296 Repeated falls: Secondary | ICD-10-CM | POA: Diagnosis not present

## 2018-01-23 DIAGNOSIS — M79661 Pain in right lower leg: Secondary | ICD-10-CM | POA: Diagnosis not present

## 2018-01-23 DIAGNOSIS — R2689 Other abnormalities of gait and mobility: Secondary | ICD-10-CM | POA: Diagnosis not present

## 2018-01-23 DIAGNOSIS — R278 Other lack of coordination: Secondary | ICD-10-CM | POA: Diagnosis not present

## 2018-01-23 DIAGNOSIS — R1319 Other dysphagia: Secondary | ICD-10-CM | POA: Diagnosis not present

## 2018-01-24 DIAGNOSIS — Z9181 History of falling: Secondary | ICD-10-CM | POA: Diagnosis not present

## 2018-01-24 DIAGNOSIS — R1319 Other dysphagia: Secondary | ICD-10-CM | POA: Diagnosis not present

## 2018-01-24 DIAGNOSIS — M79661 Pain in right lower leg: Secondary | ICD-10-CM | POA: Diagnosis not present

## 2018-01-24 DIAGNOSIS — R2689 Other abnormalities of gait and mobility: Secondary | ICD-10-CM | POA: Diagnosis not present

## 2018-01-24 DIAGNOSIS — R296 Repeated falls: Secondary | ICD-10-CM | POA: Diagnosis not present

## 2018-01-24 DIAGNOSIS — M79662 Pain in left lower leg: Secondary | ICD-10-CM | POA: Diagnosis not present

## 2018-01-24 DIAGNOSIS — R278 Other lack of coordination: Secondary | ICD-10-CM | POA: Diagnosis not present

## 2018-01-24 DIAGNOSIS — R2681 Unsteadiness on feet: Secondary | ICD-10-CM | POA: Diagnosis not present

## 2018-01-27 DIAGNOSIS — M79661 Pain in right lower leg: Secondary | ICD-10-CM | POA: Diagnosis not present

## 2018-01-27 DIAGNOSIS — R1319 Other dysphagia: Secondary | ICD-10-CM | POA: Diagnosis not present

## 2018-01-27 DIAGNOSIS — Z9181 History of falling: Secondary | ICD-10-CM | POA: Diagnosis not present

## 2018-01-27 DIAGNOSIS — R2689 Other abnormalities of gait and mobility: Secondary | ICD-10-CM | POA: Diagnosis not present

## 2018-01-27 DIAGNOSIS — R2681 Unsteadiness on feet: Secondary | ICD-10-CM | POA: Diagnosis not present

## 2018-01-27 DIAGNOSIS — R296 Repeated falls: Secondary | ICD-10-CM | POA: Diagnosis not present

## 2018-01-27 DIAGNOSIS — M79662 Pain in left lower leg: Secondary | ICD-10-CM | POA: Diagnosis not present

## 2018-01-27 DIAGNOSIS — R278 Other lack of coordination: Secondary | ICD-10-CM | POA: Diagnosis not present

## 2018-01-28 DIAGNOSIS — R2689 Other abnormalities of gait and mobility: Secondary | ICD-10-CM | POA: Diagnosis not present

## 2018-01-28 DIAGNOSIS — M79661 Pain in right lower leg: Secondary | ICD-10-CM | POA: Diagnosis not present

## 2018-01-28 DIAGNOSIS — R296 Repeated falls: Secondary | ICD-10-CM | POA: Diagnosis not present

## 2018-01-28 DIAGNOSIS — R1319 Other dysphagia: Secondary | ICD-10-CM | POA: Diagnosis not present

## 2018-01-28 DIAGNOSIS — R278 Other lack of coordination: Secondary | ICD-10-CM | POA: Diagnosis not present

## 2018-01-28 DIAGNOSIS — Z9181 History of falling: Secondary | ICD-10-CM | POA: Diagnosis not present

## 2018-01-28 DIAGNOSIS — R2681 Unsteadiness on feet: Secondary | ICD-10-CM | POA: Diagnosis not present

## 2018-01-28 DIAGNOSIS — M79662 Pain in left lower leg: Secondary | ICD-10-CM | POA: Diagnosis not present

## 2018-01-29 DIAGNOSIS — M79662 Pain in left lower leg: Secondary | ICD-10-CM | POA: Diagnosis not present

## 2018-01-29 DIAGNOSIS — Z9181 History of falling: Secondary | ICD-10-CM | POA: Diagnosis not present

## 2018-01-29 DIAGNOSIS — R2681 Unsteadiness on feet: Secondary | ICD-10-CM | POA: Diagnosis not present

## 2018-01-29 DIAGNOSIS — M79661 Pain in right lower leg: Secondary | ICD-10-CM | POA: Diagnosis not present

## 2018-01-29 DIAGNOSIS — R1319 Other dysphagia: Secondary | ICD-10-CM | POA: Diagnosis not present

## 2018-01-29 DIAGNOSIS — R278 Other lack of coordination: Secondary | ICD-10-CM | POA: Diagnosis not present

## 2018-01-29 DIAGNOSIS — R296 Repeated falls: Secondary | ICD-10-CM | POA: Diagnosis not present

## 2018-01-29 DIAGNOSIS — R2689 Other abnormalities of gait and mobility: Secondary | ICD-10-CM | POA: Diagnosis not present

## 2018-02-03 DIAGNOSIS — Z9181 History of falling: Secondary | ICD-10-CM | POA: Diagnosis not present

## 2018-02-03 DIAGNOSIS — R296 Repeated falls: Secondary | ICD-10-CM | POA: Diagnosis not present

## 2018-02-03 DIAGNOSIS — R278 Other lack of coordination: Secondary | ICD-10-CM | POA: Diagnosis not present

## 2018-02-03 DIAGNOSIS — M79661 Pain in right lower leg: Secondary | ICD-10-CM | POA: Diagnosis not present

## 2018-02-03 DIAGNOSIS — R2681 Unsteadiness on feet: Secondary | ICD-10-CM | POA: Diagnosis not present

## 2018-02-03 DIAGNOSIS — R1319 Other dysphagia: Secondary | ICD-10-CM | POA: Diagnosis not present

## 2018-02-03 DIAGNOSIS — R2689 Other abnormalities of gait and mobility: Secondary | ICD-10-CM | POA: Diagnosis not present

## 2018-02-03 DIAGNOSIS — M79662 Pain in left lower leg: Secondary | ICD-10-CM | POA: Diagnosis not present

## 2018-02-05 DIAGNOSIS — Z9181 History of falling: Secondary | ICD-10-CM | POA: Diagnosis not present

## 2018-02-05 DIAGNOSIS — R2681 Unsteadiness on feet: Secondary | ICD-10-CM | POA: Diagnosis not present

## 2018-02-05 DIAGNOSIS — M79661 Pain in right lower leg: Secondary | ICD-10-CM | POA: Diagnosis not present

## 2018-02-05 DIAGNOSIS — R296 Repeated falls: Secondary | ICD-10-CM | POA: Diagnosis not present

## 2018-02-05 DIAGNOSIS — R278 Other lack of coordination: Secondary | ICD-10-CM | POA: Diagnosis not present

## 2018-02-05 DIAGNOSIS — M79662 Pain in left lower leg: Secondary | ICD-10-CM | POA: Diagnosis not present

## 2018-02-05 DIAGNOSIS — R2689 Other abnormalities of gait and mobility: Secondary | ICD-10-CM | POA: Diagnosis not present

## 2018-02-05 DIAGNOSIS — R1319 Other dysphagia: Secondary | ICD-10-CM | POA: Diagnosis not present

## 2018-02-06 DIAGNOSIS — R2689 Other abnormalities of gait and mobility: Secondary | ICD-10-CM | POA: Diagnosis not present

## 2018-02-06 DIAGNOSIS — R296 Repeated falls: Secondary | ICD-10-CM | POA: Diagnosis not present

## 2018-02-06 DIAGNOSIS — Z9181 History of falling: Secondary | ICD-10-CM | POA: Diagnosis not present

## 2018-02-06 DIAGNOSIS — R278 Other lack of coordination: Secondary | ICD-10-CM | POA: Diagnosis not present

## 2018-02-06 DIAGNOSIS — M79662 Pain in left lower leg: Secondary | ICD-10-CM | POA: Diagnosis not present

## 2018-02-06 DIAGNOSIS — R2681 Unsteadiness on feet: Secondary | ICD-10-CM | POA: Diagnosis not present

## 2018-02-06 DIAGNOSIS — R1319 Other dysphagia: Secondary | ICD-10-CM | POA: Diagnosis not present

## 2018-02-06 DIAGNOSIS — M79661 Pain in right lower leg: Secondary | ICD-10-CM | POA: Diagnosis not present

## 2018-02-18 DIAGNOSIS — R1084 Generalized abdominal pain: Secondary | ICD-10-CM | POA: Diagnosis not present

## 2018-02-18 DIAGNOSIS — I10 Essential (primary) hypertension: Secondary | ICD-10-CM | POA: Diagnosis not present

## 2018-02-20 ENCOUNTER — Ambulatory Visit: Payer: Self-pay | Admitting: Adult Health

## 2018-02-21 DIAGNOSIS — Z79899 Other long term (current) drug therapy: Secondary | ICD-10-CM | POA: Diagnosis not present

## 2018-02-26 ENCOUNTER — Ambulatory Visit: Payer: Self-pay | Admitting: Family Medicine

## 2018-02-26 DIAGNOSIS — E568 Deficiency of other vitamins: Secondary | ICD-10-CM | POA: Diagnosis not present

## 2018-02-26 DIAGNOSIS — R1084 Generalized abdominal pain: Secondary | ICD-10-CM | POA: Diagnosis not present

## 2018-02-26 DIAGNOSIS — I10 Essential (primary) hypertension: Secondary | ICD-10-CM | POA: Diagnosis not present

## 2018-02-27 ENCOUNTER — Ambulatory Visit: Payer: Self-pay | Admitting: Family Medicine

## 2018-02-28 DIAGNOSIS — R2681 Unsteadiness on feet: Secondary | ICD-10-CM | POA: Diagnosis not present

## 2018-02-28 DIAGNOSIS — Z79899 Other long term (current) drug therapy: Secondary | ICD-10-CM | POA: Diagnosis not present

## 2018-02-28 DIAGNOSIS — R1319 Other dysphagia: Secondary | ICD-10-CM | POA: Diagnosis not present

## 2018-02-28 DIAGNOSIS — Z0001 Encounter for general adult medical examination with abnormal findings: Secondary | ICD-10-CM | POA: Diagnosis not present

## 2018-02-28 DIAGNOSIS — M79661 Pain in right lower leg: Secondary | ICD-10-CM | POA: Diagnosis not present

## 2018-02-28 DIAGNOSIS — M79662 Pain in left lower leg: Secondary | ICD-10-CM | POA: Diagnosis not present

## 2018-02-28 DIAGNOSIS — R2689 Other abnormalities of gait and mobility: Secondary | ICD-10-CM | POA: Diagnosis not present

## 2018-02-28 DIAGNOSIS — R278 Other lack of coordination: Secondary | ICD-10-CM | POA: Diagnosis not present

## 2018-02-28 DIAGNOSIS — D509 Iron deficiency anemia, unspecified: Secondary | ICD-10-CM | POA: Diagnosis not present

## 2018-02-28 DIAGNOSIS — R296 Repeated falls: Secondary | ICD-10-CM | POA: Diagnosis not present

## 2018-02-28 DIAGNOSIS — Z9181 History of falling: Secondary | ICD-10-CM | POA: Diagnosis not present

## 2018-03-04 DIAGNOSIS — Z9181 History of falling: Secondary | ICD-10-CM | POA: Diagnosis not present

## 2018-03-04 DIAGNOSIS — M79662 Pain in left lower leg: Secondary | ICD-10-CM | POA: Diagnosis not present

## 2018-03-04 DIAGNOSIS — R1319 Other dysphagia: Secondary | ICD-10-CM | POA: Diagnosis not present

## 2018-03-04 DIAGNOSIS — R296 Repeated falls: Secondary | ICD-10-CM | POA: Diagnosis not present

## 2018-03-04 DIAGNOSIS — M79661 Pain in right lower leg: Secondary | ICD-10-CM | POA: Diagnosis not present

## 2018-03-04 DIAGNOSIS — R2681 Unsteadiness on feet: Secondary | ICD-10-CM | POA: Diagnosis not present

## 2018-03-04 DIAGNOSIS — R2689 Other abnormalities of gait and mobility: Secondary | ICD-10-CM | POA: Diagnosis not present

## 2018-03-04 DIAGNOSIS — R278 Other lack of coordination: Secondary | ICD-10-CM | POA: Diagnosis not present

## 2018-03-05 DIAGNOSIS — R2689 Other abnormalities of gait and mobility: Secondary | ICD-10-CM | POA: Diagnosis not present

## 2018-03-05 DIAGNOSIS — R278 Other lack of coordination: Secondary | ICD-10-CM | POA: Diagnosis not present

## 2018-03-05 DIAGNOSIS — Z9181 History of falling: Secondary | ICD-10-CM | POA: Diagnosis not present

## 2018-03-05 DIAGNOSIS — R296 Repeated falls: Secondary | ICD-10-CM | POA: Diagnosis not present

## 2018-03-05 DIAGNOSIS — R2681 Unsteadiness on feet: Secondary | ICD-10-CM | POA: Diagnosis not present

## 2018-03-05 DIAGNOSIS — R1319 Other dysphagia: Secondary | ICD-10-CM | POA: Diagnosis not present

## 2018-03-05 DIAGNOSIS — M79661 Pain in right lower leg: Secondary | ICD-10-CM | POA: Diagnosis not present

## 2018-03-05 DIAGNOSIS — M79662 Pain in left lower leg: Secondary | ICD-10-CM | POA: Diagnosis not present

## 2018-03-07 DIAGNOSIS — R1319 Other dysphagia: Secondary | ICD-10-CM | POA: Diagnosis not present

## 2018-03-07 DIAGNOSIS — R2681 Unsteadiness on feet: Secondary | ICD-10-CM | POA: Diagnosis not present

## 2018-03-07 DIAGNOSIS — M79661 Pain in right lower leg: Secondary | ICD-10-CM | POA: Diagnosis not present

## 2018-03-07 DIAGNOSIS — R296 Repeated falls: Secondary | ICD-10-CM | POA: Diagnosis not present

## 2018-03-07 DIAGNOSIS — Z9181 History of falling: Secondary | ICD-10-CM | POA: Diagnosis not present

## 2018-03-07 DIAGNOSIS — M79662 Pain in left lower leg: Secondary | ICD-10-CM | POA: Diagnosis not present

## 2018-03-07 DIAGNOSIS — R2689 Other abnormalities of gait and mobility: Secondary | ICD-10-CM | POA: Diagnosis not present

## 2018-03-07 DIAGNOSIS — R278 Other lack of coordination: Secondary | ICD-10-CM | POA: Diagnosis not present

## 2018-03-08 DIAGNOSIS — R2681 Unsteadiness on feet: Secondary | ICD-10-CM | POA: Diagnosis not present

## 2018-03-08 DIAGNOSIS — Z9181 History of falling: Secondary | ICD-10-CM | POA: Diagnosis not present

## 2018-03-08 DIAGNOSIS — M79662 Pain in left lower leg: Secondary | ICD-10-CM | POA: Diagnosis not present

## 2018-03-08 DIAGNOSIS — R2689 Other abnormalities of gait and mobility: Secondary | ICD-10-CM | POA: Diagnosis not present

## 2018-03-08 DIAGNOSIS — R278 Other lack of coordination: Secondary | ICD-10-CM | POA: Diagnosis not present

## 2018-03-08 DIAGNOSIS — M79661 Pain in right lower leg: Secondary | ICD-10-CM | POA: Diagnosis not present

## 2018-03-08 DIAGNOSIS — R1319 Other dysphagia: Secondary | ICD-10-CM | POA: Diagnosis not present

## 2018-03-08 DIAGNOSIS — R296 Repeated falls: Secondary | ICD-10-CM | POA: Diagnosis not present
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
## 2018-03-11 DIAGNOSIS — Z9181 History of falling: Secondary | ICD-10-CM | POA: Diagnosis not present

## 2018-03-11 DIAGNOSIS — M79662 Pain in left lower leg: Secondary | ICD-10-CM | POA: Diagnosis not present

## 2018-03-11 DIAGNOSIS — R296 Repeated falls: Secondary | ICD-10-CM | POA: Diagnosis not present

## 2018-03-11 DIAGNOSIS — M79661 Pain in right lower leg: Secondary | ICD-10-CM | POA: Diagnosis not present

## 2018-03-11 DIAGNOSIS — R278 Other lack of coordination: Secondary | ICD-10-CM | POA: Diagnosis not present

## 2018-03-11 DIAGNOSIS — R1319 Other dysphagia: Secondary | ICD-10-CM | POA: Diagnosis not present

## 2018-03-11 DIAGNOSIS — R2681 Unsteadiness on feet: Secondary | ICD-10-CM | POA: Diagnosis not present

## 2018-03-11 DIAGNOSIS — R2689 Other abnormalities of gait and mobility: Secondary | ICD-10-CM | POA: Diagnosis not present

## 2018-03-11 DIAGNOSIS — R1312 Dysphagia, oropharyngeal phase: Secondary | ICD-10-CM | POA: Diagnosis not present

## 2018-03-12 ENCOUNTER — Ambulatory Visit (INDEPENDENT_AMBULATORY_CARE_PROVIDER_SITE_OTHER): Payer: Medicare Other | Admitting: Family Medicine

## 2018-03-12 ENCOUNTER — Encounter: Payer: Self-pay | Admitting: Family Medicine

## 2018-03-12 VITALS — Ht 68.0 in

## 2018-03-12 DIAGNOSIS — F03B18 Unspecified dementia, moderate, with other behavioral disturbance: Secondary | ICD-10-CM

## 2018-03-12 DIAGNOSIS — R569 Unspecified convulsions: Secondary | ICD-10-CM | POA: Diagnosis not present

## 2018-03-12 DIAGNOSIS — F0391 Unspecified dementia with behavioral disturbance: Secondary | ICD-10-CM

## 2018-03-12 NOTE — Progress Notes (Signed)
I reviewed note and agree with plan.   Suanne Marker, MD 03/12/2018, 1:24 PM Certified in Neurology, Neurophysiology and Neuroimaging  [facility] [address] [place], [place] [address] [phone]

## 2018-03-12 NOTE — Patient Instructions (Signed)
Continue current therapy, no recent seizure activity  Continue Namenda as prescribed  Discuss medication administration times with [facility] (see if meds can be given when wife is present as he seems to do better)    Dementia Caregiver Guide Dementia is a term used to describe a number of symptoms that affect memory and thinking. The most common symptoms include:  Memory loss.  Trouble with language and communication.  Trouble concentrating.  Poor judgment.  Problems with reasoning.  Child-like behavior and language.  Extreme anxiety.  Angry outbursts.  Wandering from home or public places. Dementia usually gets worse slowly over time. In the early stages, people with dementia can stay independent and safe with some help. In later stages, they need help with daily tasks such as dressing, grooming, and using the bathroom. How to help the person with dementia cope Dementia can be frightening and confusing. Here are some tips to help the person with dementia cope with changes caused by the disease. General tips  Keep the person on track with his or her routine.  Try to identify areas where the person may need help.  Be supportive, patient, calm, and encouraging.  Gently remind the person that adjusting to changes takes time.  Help with the tasks that the person has asked for help with.  Keep the person involved in daily tasks and decisions as much as possible.  Encourage conversation, but try not to get frustrated or harried if the person struggles to find words or does not seem to appreciate your help. Communication tips  When the person is talking or seems frustrated, make eye contact and hold the person's hand.  Ask specific questions that need yes or no answers.  Use simple words, short sentences, and a calm voice. Only give one direction at a time.  When offering choices, limit them to just 1 or 2.  Avoid correcting the person in a negative way.  If the  person is struggling to find the right words, gently try to help him or her. How to recognize symptoms of stress Symptoms of stress in caregivers include:  Feeling frustrated or angry with the person with dementia.  Denying that the person has dementia or that his or her symptoms will not improve.  Feeling hopeless and unappreciated.  Difficulty sleeping.  Difficulty concentrating.  Feeling anxious, irritable, or depressed.  Developing stress-related health problems.  Feeling like you have too little time for your own life. Follow these instructions at home:   Make sure that you and the person you are caring for: ? Get regular sleep. ? Exercise regularly. ? Eat regular, nutritious meals. ? Drink enough fluid to keep your urine clear or pale yellow. ? Take over-the-counter and prescription medicines only as told by your health care providers. ? Attend all scheduled health care appointments.  Join a support group with others who are caregivers.  Ask about respite care resources so that you can have a regular break from the stress of caregiving.  Look for signs of stress in yourself and in the person you are caring for. If you notice signs of stress, take steps to manage it.  Consider any safety risks and take steps to avoid them.  Organize medications in a pill box for each day of the week.  Create a plan to handle any legal or financial matters. Get legal or financial advice if needed.  Keep a calendar in a central location to remind the person of appointments or other activities.  Tips for reducing the risk of injury  Keep floors clear of clutter. Remove rugs, magazine racks, and floor lamps.  Keep hallways well lit, especially at night.  Put a handrail and nonslip mat in the bathtub or shower.  Put childproof locks on cabinets that contain dangerous items, such as medicines, alcohol, guns, toxic cleaning items, sharp tools or utensils, matches, and lighters.  Put  the locks in places where the person cannot see or reach them easily. This will help ensure that the person does not wander out of the house and get lost.  Be prepared for emergencies. Keep a list of emergency phone numbers and addresses in a convenient area.  Remove car keys and lock garage doors so that the person does not try to get in the car and drive.  Have the person wear a bracelet that tracks locations and identifies the person as having memory problems. This should be worn at all times for safety. Where to find support: Many individuals and organizations offer support. These include:  Support groups for people with dementia and for caregivers.  Counselors or therapists.  Home health care services.  Adult day care centers. Where to find more information Alzheimer's Association: LimitLaws.huwww.alz.org Contact a health care provider if:  The person's health is rapidly getting worse.  You are no longer able to care for the person.  Caring for the person is affecting your physical and emotional health.  The person threatens himself or herself, you, or anyone else. Summary  Dementia is a term used to describe a number of symptoms that affect memory and thinking.  Dementia usually gets worse slowly over time.  Take steps to reduce the person's risk of injury, and to plan for future care.  Caregivers need support, relief from caregiving, and time for their own lives. This information is not intended to replace advice given to you by your health care provider. Make sure you discuss any questions you have with your health care provider. Document Released: 11/29/2015 Document Revised: 11/29/2015 Document Reviewed: 11/29/2015 Elsevier Interactive Patient Education  2019 [employer]  Seizure, Adult When you have a seizure:  Parts of your body may move.  You may have a change in how aware or awake (conscious) you are.  You may shake (convulse). Seizures usually last from 30  seconds to 2 minutes. Usually, they are not harmful unless they last a long time. What are the signs or symptoms? Common symptoms of this condition include:  Shaking (convulsions).  Stiffness in the body.  Passing out (losing consciousness).  Uncontrolled movements in the: ? Arms or legs. ? Eyes. ? Head. ? Mouth. Some people have symptoms right before a seizure happens. These symptoms may include:  Fear.  Worry (anxiety).  Feeling like you are going to throw up (nausea).  Feeling like the room is spinning (vertigo).  Feeling like you saw or heard something before (dj vu).  Odd tastes or smells.  Changes in vision, such as seeing flashing lights or spots. Follow these instructions at home: Medicines   Take over-the-counter and prescription medicines only as told by your doctor.  Do not eat or drink anything that may keep your medicine from working, such as alcohol. Activity  Do not do any activities that would be dangerous if you had another seizure, like driving or swimming. Wait until your doctor says it is safe for you to do them.  If you live in the [place] ask your local DMV (department of motor vehicles)  when you can drive.  Get plenty of rest. Teaching others   Teach friends and family what to do when you have a seizure. They should: ? Lay you on the ground. ? Protect your head and body. ? Loosen any tight clothing around your neck. ? Turn you on your side. ? Not hold you down. ? Not put anything into your mouth. ? Know whether or not you need emergency care. ? Stay with you until you are better. General instructions  Contact your doctor each time you have a seizure.  Avoid anything that gives you seizures.  Keep a seizure diary. Write down: ? What you think caused each seizure. ? What you remember about each seizure.  Keep all follow-up visits as told by your doctor. This is important. Contact a doctor if:  You have another seizure.  You  have seizures more often.  There is any change in what happens during your seizures.  You keep having seizures with treatment.  You have symptoms of being sick or having an infection. Get help right away if:  You have a seizure: ? That lasts longer than 5 minutes. ? That is different than seizures you had before. ? That makes it harder to breathe. ? After you hurt your head.  After a seizure, you cannot speak or use a part of your body.  After a seizure, you are confused or have a bad headache.  You have two or more seizures in a row.  You are having seizures more often.  You do not wake up right after a seizure.  You get hurt during a seizure. In an emergency:  These symptoms may be an emergency. Do not wait to see if the symptoms will go away. Get medical help right away. Call your local emergency services (911 in the [place] Do not drive yourself to the hospital. Summary  Seizures usually last from 30 seconds to 2 minutes. Usually, they are not harmful unless they last a long time.  Do not eat or drink anything that may keep your medicine from working, such as alcohol.  Teach friends and family what to do when you have a seizure.  Contact your doctor each time you have a seizure. This information is not intended to replace advice given to you by your health care provider. Make sure you discuss any questions you have with your health care provider. Document Released: 06/13/2007 Document Revised: 09/18/2017 Document Reviewed: 01/31/2017 Elsevier Interactive Patient Education  2019 [employer].

## 2018-03-14 DIAGNOSIS — R296 Repeated falls: Secondary | ICD-10-CM | POA: Diagnosis not present

## 2018-03-14 DIAGNOSIS — R278 Other lack of coordination: Secondary | ICD-10-CM | POA: Diagnosis not present

## 2018-03-14 DIAGNOSIS — R1319 Other dysphagia: Secondary | ICD-10-CM | POA: Diagnosis not present

## 2018-03-14 DIAGNOSIS — R2681 Unsteadiness on feet: Secondary | ICD-10-CM | POA: Diagnosis not present

## 2018-03-14 DIAGNOSIS — M79661 Pain in right lower leg: Secondary | ICD-10-CM | POA: Diagnosis not present

## 2018-03-14 DIAGNOSIS — M79662 Pain in left lower leg: Secondary | ICD-10-CM | POA: Diagnosis not present

## 2018-03-14 DIAGNOSIS — R2689 Other abnormalities of gait and mobility: Secondary | ICD-10-CM | POA: Diagnosis not present

## 2018-03-14 DIAGNOSIS — R1312 Dysphagia, oropharyngeal phase: Secondary | ICD-10-CM | POA: Diagnosis not present

## 2018-03-14 DIAGNOSIS — Z9181 History of falling: Secondary | ICD-10-CM | POA: Diagnosis not present

## 2018-03-17 DIAGNOSIS — Z79899 Other long term (current) drug therapy: Secondary | ICD-10-CM | POA: Diagnosis not present

## 2018-03-17 DIAGNOSIS — M79661 Pain in right lower leg: Secondary | ICD-10-CM | POA: Diagnosis not present

## 2018-03-17 DIAGNOSIS — R278 Other lack of coordination: Secondary | ICD-10-CM | POA: Diagnosis not present

## 2018-03-17 DIAGNOSIS — R1319 Other dysphagia: Secondary | ICD-10-CM | POA: Diagnosis not present

## 2018-03-17 DIAGNOSIS — R2681 Unsteadiness on feet: Secondary | ICD-10-CM | POA: Diagnosis not present

## 2018-03-17 DIAGNOSIS — M79662 Pain in left lower leg: Secondary | ICD-10-CM | POA: Diagnosis not present

## 2018-03-17 DIAGNOSIS — R2689 Other abnormalities of gait and mobility: Secondary | ICD-10-CM | POA: Diagnosis not present

## 2018-03-17 DIAGNOSIS — Z9181 History of falling: Secondary | ICD-10-CM | POA: Diagnosis not present

## 2018-03-17 DIAGNOSIS — R1312 Dysphagia, oropharyngeal phase: Secondary | ICD-10-CM | POA: Diagnosis not present

## 2018-03-17 DIAGNOSIS — R296 Repeated falls: Secondary | ICD-10-CM | POA: Diagnosis not present

## 2018-03-18 DIAGNOSIS — D72828 Other elevated white blood cell count: Secondary | ICD-10-CM | POA: Diagnosis not present

## 2018-03-18 DIAGNOSIS — D72829 Elevated white blood cell count, unspecified: Secondary | ICD-10-CM | POA: Diagnosis not present

## 2018-03-18 DIAGNOSIS — I10 Essential (primary) hypertension: Secondary | ICD-10-CM | POA: Diagnosis not present

## 2018-03-19 DIAGNOSIS — R278 Other lack of coordination: Secondary | ICD-10-CM | POA: Diagnosis not present

## 2018-03-19 DIAGNOSIS — R2689 Other abnormalities of gait and mobility: Secondary | ICD-10-CM | POA: Diagnosis not present

## 2018-03-19 DIAGNOSIS — R1312 Dysphagia, oropharyngeal phase: Secondary | ICD-10-CM | POA: Diagnosis not present

## 2018-03-19 DIAGNOSIS — R1319 Other dysphagia: Secondary | ICD-10-CM | POA: Diagnosis not present

## 2018-03-19 DIAGNOSIS — M79662 Pain in left lower leg: Secondary | ICD-10-CM | POA: Diagnosis not present

## 2018-03-19 DIAGNOSIS — Z9181 History of falling: Secondary | ICD-10-CM | POA: Diagnosis not present

## 2018-03-19 DIAGNOSIS — R2681 Unsteadiness on feet: Secondary | ICD-10-CM | POA: Diagnosis not present

## 2018-03-19 DIAGNOSIS — M79661 Pain in right lower leg: Secondary | ICD-10-CM | POA: Diagnosis not present

## 2018-03-19 DIAGNOSIS — R296 Repeated falls: Secondary | ICD-10-CM | POA: Diagnosis not present

## 2018-03-21 DIAGNOSIS — R1319 Other dysphagia: Secondary | ICD-10-CM | POA: Diagnosis not present

## 2018-03-21 DIAGNOSIS — R2681 Unsteadiness on feet: Secondary | ICD-10-CM | POA: Diagnosis not present

## 2018-03-21 DIAGNOSIS — R296 Repeated falls: Secondary | ICD-10-CM | POA: Diagnosis not present

## 2018-03-21 DIAGNOSIS — Z9181 History of falling: Secondary | ICD-10-CM | POA: Diagnosis not present

## 2018-03-21 DIAGNOSIS — R1312 Dysphagia, oropharyngeal phase: Secondary | ICD-10-CM | POA: Diagnosis not present

## 2018-03-21 DIAGNOSIS — M79662 Pain in left lower leg: Secondary | ICD-10-CM | POA: Diagnosis not present

## 2018-03-21 DIAGNOSIS — M79661 Pain in right lower leg: Secondary | ICD-10-CM | POA: Diagnosis not present

## 2018-03-21 DIAGNOSIS — R278 Other lack of coordination: Secondary | ICD-10-CM | POA: Diagnosis not present

## 2018-03-21 DIAGNOSIS — R2689 Other abnormalities of gait and mobility: Secondary | ICD-10-CM | POA: Diagnosis not present

## 2018-03-22 ENCOUNTER — Inpatient Hospital Stay (HOSPITAL_COMMUNITY): Payer: Medicare Other

## 2018-03-22 ENCOUNTER — Other Ambulatory Visit (HOSPITAL_COMMUNITY): Payer: Self-pay | Admitting: *Deleted

## 2018-03-22 ENCOUNTER — Encounter (HOSPITAL_COMMUNITY): Payer: Self-pay | Admitting: Emergency Medicine

## 2018-03-22 ENCOUNTER — Emergency Department (HOSPITAL_COMMUNITY): Payer: Medicare Other

## 2018-03-22 ENCOUNTER — Inpatient Hospital Stay (HOSPITAL_COMMUNITY)
Admission: EM | Admit: 2018-03-22 | Discharge: 2018-03-26 | DRG: 100 | Disposition: A | Discharge status: Skilled Nursing Facility | Payer: Medicare Other | Source: Skilled Nursing Facility | Attending: Pulmonary Disease | Admitting: Pulmonary Disease

## 2018-03-22 ENCOUNTER — Other Ambulatory Visit: Payer: Self-pay

## 2018-03-22 DIAGNOSIS — Z9114 Patient's other noncompliance with medication regimen: Secondary | ICD-10-CM

## 2018-03-22 DIAGNOSIS — G40401 Other generalized epilepsy and epileptic syndromes, not intractable, with status epilepticus: Secondary | ICD-10-CM | POA: Diagnosis present

## 2018-03-22 DIAGNOSIS — Z95828 Presence of other vascular implants and grafts: Secondary | ICD-10-CM | POA: Diagnosis not present

## 2018-03-22 DIAGNOSIS — Z885 Allergy status to narcotic agent status: Secondary | ICD-10-CM

## 2018-03-22 DIAGNOSIS — I160 Hypertensive urgency: Secondary | ICD-10-CM | POA: Diagnosis not present

## 2018-03-22 DIAGNOSIS — Z833 Family history of diabetes mellitus: Secondary | ICD-10-CM

## 2018-03-22 DIAGNOSIS — R279 Unspecified lack of coordination: Secondary | ICD-10-CM | POA: Diagnosis not present

## 2018-03-22 DIAGNOSIS — G459 Transient cerebral ischemic attack, unspecified: Secondary | ICD-10-CM | POA: Diagnosis not present

## 2018-03-22 DIAGNOSIS — J449 Chronic obstructive pulmonary disease, unspecified: Secondary | ICD-10-CM | POA: Diagnosis present

## 2018-03-22 DIAGNOSIS — Z8673 Personal history of transient ischemic attack (TIA), and cerebral infarction without residual deficits: Secondary | ICD-10-CM | POA: Diagnosis not present

## 2018-03-22 DIAGNOSIS — J9601 Acute respiratory failure with hypoxia: Secondary | ICD-10-CM | POA: Diagnosis not present

## 2018-03-22 DIAGNOSIS — Z91018 Allergy to other foods: Secondary | ICD-10-CM | POA: Diagnosis not present

## 2018-03-22 DIAGNOSIS — G9341 Metabolic encephalopathy: Secondary | ICD-10-CM | POA: Diagnosis present

## 2018-03-22 DIAGNOSIS — I739 Peripheral vascular disease, unspecified: Secondary | ICD-10-CM | POA: Diagnosis present

## 2018-03-22 DIAGNOSIS — Z931 Gastrostomy status: Secondary | ICD-10-CM | POA: Diagnosis not present

## 2018-03-22 DIAGNOSIS — N390 Urinary tract infection, site not specified: Secondary | ICD-10-CM | POA: Diagnosis not present

## 2018-03-22 DIAGNOSIS — Z8679 Personal history of other diseases of the circulatory system: Secondary | ICD-10-CM

## 2018-03-22 DIAGNOSIS — R069 Unspecified abnormalities of breathing: Secondary | ICD-10-CM | POA: Diagnosis not present

## 2018-03-22 DIAGNOSIS — Z87442 Personal history of urinary calculi: Secondary | ICD-10-CM | POA: Diagnosis not present

## 2018-03-22 DIAGNOSIS — Z4659 Encounter for fitting and adjustment of other gastrointestinal appliance and device: Secondary | ICD-10-CM

## 2018-03-22 DIAGNOSIS — Z823 Family history of stroke: Secondary | ICD-10-CM

## 2018-03-22 DIAGNOSIS — G40901 Epilepsy, unspecified, not intractable, with status epilepticus: Secondary | ICD-10-CM | POA: Diagnosis not present

## 2018-03-22 DIAGNOSIS — F0390 Unspecified dementia without behavioral disturbance: Secondary | ICD-10-CM | POA: Diagnosis present

## 2018-03-22 DIAGNOSIS — Z7902 Long term (current) use of antithrombotics/antiplatelets: Secondary | ICD-10-CM

## 2018-03-22 DIAGNOSIS — Z79899 Other long term (current) drug therapy: Secondary | ICD-10-CM

## 2018-03-22 DIAGNOSIS — Z87891 Personal history of nicotine dependence: Secondary | ICD-10-CM | POA: Diagnosis not present

## 2018-03-22 DIAGNOSIS — F0150 Vascular dementia without behavioral disturbance: Secondary | ICD-10-CM | POA: Diagnosis not present

## 2018-03-22 DIAGNOSIS — R404 Transient alteration of awareness: Secondary | ICD-10-CM | POA: Diagnosis not present

## 2018-03-22 DIAGNOSIS — Z800 Family history of malignant neoplasm of digestive organs: Secondary | ICD-10-CM

## 2018-03-22 DIAGNOSIS — Z8249 Family history of ischemic heart disease and other diseases of the circulatory system: Secondary | ICD-10-CM

## 2018-03-22 DIAGNOSIS — G9340 Encephalopathy, unspecified: Secondary | ICD-10-CM | POA: Diagnosis not present

## 2018-03-22 DIAGNOSIS — R40243 Glasgow coma scale score 3-8, unspecified time: Secondary | ICD-10-CM | POA: Diagnosis not present

## 2018-03-22 DIAGNOSIS — R0602 Shortness of breath: Secondary | ICD-10-CM | POA: Diagnosis not present

## 2018-03-22 DIAGNOSIS — R0603 Acute respiratory distress: Secondary | ICD-10-CM | POA: Diagnosis present

## 2018-03-22 DIAGNOSIS — I10 Essential (primary) hypertension: Secondary | ICD-10-CM | POA: Diagnosis present

## 2018-03-22 DIAGNOSIS — R4020 Unspecified coma: Secondary | ICD-10-CM | POA: Diagnosis not present

## 2018-03-22 DIAGNOSIS — Z743 Need for continuous supervision: Secondary | ICD-10-CM | POA: Diagnosis not present

## 2018-03-22 DIAGNOSIS — Z4682 Encounter for fitting and adjustment of non-vascular catheter: Secondary | ICD-10-CM | POA: Diagnosis not present

## 2018-03-22 DIAGNOSIS — R569 Unspecified convulsions: Secondary | ICD-10-CM | POA: Diagnosis not present

## 2018-03-22 DIAGNOSIS — Z808 Family history of malignant neoplasm of other organs or systems: Secondary | ICD-10-CM

## 2018-03-22 DIAGNOSIS — Z880 Allergy status to penicillin: Secondary | ICD-10-CM

## 2018-03-22 DIAGNOSIS — N179 Acute kidney failure, unspecified: Secondary | ICD-10-CM | POA: Diagnosis present

## 2018-03-22 LAB — INFLUENZA PANEL BY PCR (TYPE A & B)
INFLAPCR: NEGATIVE
Influenza B By PCR: NEGATIVE

## 2018-03-22 LAB — URINALYSIS, ROUTINE W REFLEX MICROSCOPIC
Bilirubin Urine: NEGATIVE
Glucose, UA: NEGATIVE mg/dL
Hgb urine dipstick: NEGATIVE
Ketones, ur: NEGATIVE mg/dL
Nitrite: NEGATIVE
Protein, ur: 30 mg/dL — AB
SPECIFIC GRAVITY, URINE: 1.016 (ref 1.005–1.030)
pH: 5 (ref 5.0–8.0)

## 2018-03-22 LAB — CBC WITH DIFFERENTIAL/PLATELET
Abs Immature Granulocytes: 0.11 10*3/uL — ABNORMAL HIGH (ref 0.00–0.07)
BASOS PCT: 0 %
Basophils Absolute: 0 10*3/uL (ref 0.0–0.1)
Eosinophils Absolute: 0 10*3/uL (ref 0.0–0.5)
Eosinophils Relative: 0 %
HCT: 45.7 % (ref 39.0–52.0)
Hemoglobin: 14 g/dL (ref 13.0–17.0)
Immature Granulocytes: 1 %
Lymphocytes Relative: 11 %
Lymphs Abs: 1.6 10*3/uL (ref 0.7–4.0)
MCH: 32.3 pg (ref 26.0–34.0)
MCHC: 30.6 g/dL (ref 30.0–36.0)
MCV: 105.3 fL — ABNORMAL HIGH (ref 80.0–100.0)
Monocytes Absolute: 0.8 10*3/uL (ref 0.1–1.0)
Monocytes Relative: 5 %
NEUTROS ABS: 11.6 10*3/uL — AB (ref 1.7–7.7)
Neutrophils Relative %: 83 %
PLATELETS: 388 10*3/uL (ref 150–400)
RBC: 4.34 MIL/uL (ref 4.22–5.81)
RDW: 13.9 % (ref 11.5–15.5)
WBC: 14 10*3/uL — ABNORMAL HIGH (ref 4.0–10.5)
nRBC: 0 % (ref 0.0–0.2)

## 2018-03-22 LAB — COMPREHENSIVE METABOLIC PANEL
ALT: <5 U/L (ref 0–44)
AST: 42 U/L — ABNORMAL HIGH (ref 15–41)
Albumin: 4 g/dL (ref 3.5–5.0)
Alkaline Phosphatase: 98 U/L (ref 38–126)
Anion gap: 30 — ABNORMAL HIGH (ref 5–15)
BUN: 14 mg/dL (ref 8–23)
CHLORIDE: 101 mmol/L (ref 98–111)
CO2: 5 mmol/L — ABNORMAL LOW (ref 22–32)
Calcium: 9.4 mg/dL (ref 8.9–10.3)
Creatinine, Ser: 1.24 mg/dL (ref 0.61–1.24)
GFR calc Af Amer: >60 mL/min (ref >60)
GFR calc non Af Amer: 59 mL/min — ABNORMAL LOW (ref >60)
Glucose, Bld: 170 mg/dL — ABNORMAL HIGH (ref 70–99)
Potassium: 4.2 mmol/L (ref 3.5–5.1)
Sodium: 136 mmol/L (ref 135–145)
Total Bilirubin: 0.3 mg/dL (ref 0.3–1.2)
Total Protein: 7.6 g/dL (ref 6.5–8.1)

## 2018-03-22 LAB — TRIGLYCERIDES: Triglycerides: 87 mg/dL (ref <150)

## 2018-03-22 LAB — CREATININE, SERUM
Creatinine, Ser: 1.21 mg/dL (ref 0.61–1.24)
GFR calc Af Amer: >60 mL/min (ref >60)
GFR calc non Af Amer: >60 mL/min (ref >60)

## 2018-03-22 LAB — BLOOD GAS, ARTERIAL
Acid-base deficit: 2.7 mmol/L — ABNORMAL HIGH (ref 0.0–2.0)
Bicarbonate: 20.7 mmol/L (ref 20.0–28.0)
Drawn by: 257881
FIO2: 100
MECHVT: 540 mL
O2 Saturation: 99.7 %
PEEP: 5 cmH2O
Patient temperature: 98.6
RATE: 16 resp/min
pCO2 arterial: 33.2 mmHg (ref 32.0–48.0)
pH, Arterial: 7.411 (ref 7.350–7.450)
pO2, Arterial: 494 mmHg — ABNORMAL HIGH (ref 83.0–108.0)

## 2018-03-22 LAB — MRSA PCR SCREENING: MRSA by PCR: NEGATIVE

## 2018-03-22 LAB — CBG MONITORING, ED: Glucose-Capillary: 165 mg/dL — ABNORMAL HIGH (ref 70–99)

## 2018-03-22 LAB — VALPROIC ACID LEVEL: Valproic Acid Lvl: <10 ug/mL — ABNORMAL LOW (ref 50.0–100.0)

## 2018-03-22 LAB — BRAIN NATRIURETIC PEPTIDE: B Natriuretic Peptide: 42.9 pg/mL (ref 0.0–100.0)

## 2018-03-22 MED ORDER — CHLORHEXIDINE GLUCONATE 0.12% ORAL RINSE (MEDLINE KIT)
15.0000 mL | Freq: Two times a day (BID) | OROMUCOSAL | Status: DC
  Administered 2018-03-22 – 2018-03-26 (×7): 15 mL via OROMUCOSAL

## 2018-03-22 MED ORDER — SODIUM CHLORIDE 0.9 % IV BOLUS
1000.0000 mL | Freq: Once | INTRAVENOUS | Status: AC
  Administered 2018-03-22: 1000 mL via INTRAVENOUS

## 2018-03-22 MED ORDER — ROCURONIUM BROMIDE 50 MG/5ML IV SOLN
80.0000 mg | Freq: Once | INTRAVENOUS | Status: AC
  Administered 2018-03-22: 80 mg via INTRAVENOUS
  Filled 2018-03-22: qty 8

## 2018-03-22 MED ORDER — FENTANYL CITRATE (PF) 100 MCG/2ML IJ SOLN
50.0000 ug | INTRAMUSCULAR | Status: DC | PRN
  Administered 2018-03-22 (×2): 50 ug via INTRAVENOUS
  Filled 2018-03-22 (×2): qty 2

## 2018-03-22 MED ORDER — PROPOFOL 1000 MG/100ML IV EMUL: 0.0000 ug/kg/min | INTRAVENOUS | Status: DC

## 2018-03-22 MED ORDER — ALBUTEROL SULFATE (2.5 MG/3ML) 0.083% IN NEBU: 5.0000 mg | INHALATION_SOLUTION | Freq: Once | RESPIRATORY_TRACT | Status: DC

## 2018-03-22 MED ORDER — ORAL CARE MOUTH RINSE
15.0000 mL | OROMUCOSAL | Status: DC
  Administered 2018-03-22 – 2018-03-24 (×21): 15 mL via OROMUCOSAL

## 2018-03-22 MED ORDER — SODIUM CHLORIDE 0.9 % IV SOLN
200.0000 mg | Freq: Two times a day (BID) | INTRAVENOUS | Status: DC
  Administered 2018-03-22 – 2018-03-26 (×9): 200 mg via INTRAVENOUS
  Filled 2018-03-22 (×9): qty 20

## 2018-03-22 MED ORDER — CARBAMAZEPINE 100 MG/5ML PO SUSP
400.0000 mg | Freq: Two times a day (BID) | ORAL | Status: DC
  Administered 2018-03-22 – 2018-03-26 (×8): 400 mg via ORAL
  Filled 2018-03-22 (×9): qty 20

## 2018-03-22 MED ORDER — PROPOFOL 1000 MG/100ML IV EMUL
5.0000 ug/kg/min | INTRAVENOUS | Status: DC
  Administered 2018-03-22: 40 ug/kg/min via INTRAVENOUS
  Administered 2018-03-22: 5 ug/kg/min via INTRAVENOUS
  Administered 2018-03-22 – 2018-03-23 (×3): 40 ug/kg/min via INTRAVENOUS
  Administered 2018-03-23 – 2018-03-24 (×2): 20 ug/kg/min via INTRAVENOUS
  Filled 2018-03-22 (×8): qty 100

## 2018-03-22 MED ORDER — ACETAMINOPHEN 325 MG PO TABS
650.0000 mg | ORAL_TABLET | ORAL | Status: DC | PRN
  Administered 2018-03-23 – 2018-03-25 (×2): 650 mg via ORAL
  Filled 2018-03-22 (×2): qty 2

## 2018-03-22 MED ORDER — HEPARIN SODIUM (PORCINE) 5000 UNIT/ML IJ SOLN
5000.0000 [IU] | Freq: Three times a day (TID) | INTRAMUSCULAR | Status: DC
  Administered 2018-03-22 – 2018-03-26 (×12): 5000 [IU] via SUBCUTANEOUS
  Filled 2018-03-22 (×12): qty 1

## 2018-03-22 MED ORDER — LEVETIRACETAM IN NACL 1500 MG/100ML IV SOLN
1500.0000 mg | Freq: Once | INTRAVENOUS | Status: AC
  Administered 2018-03-22: 1500 mg via INTRAVENOUS
  Filled 2018-03-22: qty 100

## 2018-03-22 MED ORDER — MIDAZOLAM HCL 2 MG/2ML IJ SOLN: 2.0000 mg | INTRAMUSCULAR | Status: DC | PRN

## 2018-03-22 MED ORDER — PROPOFOL 1000 MG/100ML IV EMUL
INTRAVENOUS | Status: AC
  Filled 2018-03-22: qty 100

## 2018-03-22 MED ORDER — FENTANYL CITRATE (PF) 100 MCG/2ML IJ SOLN
50.0000 ug | INTRAMUSCULAR | Status: DC | PRN
  Administered 2018-03-23: 50 ug via INTRAVENOUS
  Filled 2018-03-22: qty 2

## 2018-03-22 MED ORDER — ONDANSETRON HCL 4 MG/2ML IJ SOLN: 4.0000 mg | Freq: Four times a day (QID) | INTRAMUSCULAR | Status: DC | PRN

## 2018-03-22 MED ORDER — VALPROATE SODIUM 500 MG/5ML IV SOLN
375.0000 mg | Freq: Three times a day (TID) | INTRAVENOUS | Status: DC
  Administered 2018-03-22 – 2018-03-23 (×4): 375 mg via INTRAVENOUS
  Filled 2018-03-22 (×5): qty 3.75

## 2018-03-22 MED ORDER — FAMOTIDINE IN NACL 20-0.9 MG/50ML-% IV SOLN
20.0000 mg | Freq: Two times a day (BID) | INTRAVENOUS | Status: DC
  Administered 2018-03-22: 20 mg via INTRAVENOUS
  Filled 2018-03-22 (×6): qty 50

## 2018-03-22 MED ORDER — ETOMIDATE 2 MG/ML IV SOLN
15.0000 mg | Freq: Once | INTRAVENOUS | Status: AC
  Administered 2018-03-22: 15 mg via INTRAVENOUS

## 2018-03-22 MED ORDER — LACOSAMIDE 10 MG/ML PO SOLN: 200.0000 mg | Freq: Two times a day (BID) | ORAL | Status: DC

## 2018-03-22 MED ORDER — SODIUM CHLORIDE 0.9 % IV SOLN
INTRAVENOUS | Status: DC
  Administered 2018-03-22 – 2018-03-23 (×2): via INTRAVENOUS

## 2018-03-22 NOTE — ED Notes (Signed)
Bed: DS28 Expected date:  Expected time:  Means of arrival:  Comments: 61M Resp Distress

## 2018-03-22 NOTE — ED Triage Notes (Signed)
Patient came from nursing home. Patient was talking and laughing last night but this morning when they went to check on him he was not responding like normal. Facility stated patient had a seizure. EMS gave patient NS, 2.5 mg of midazolam, and 5.0 of albuterol.Patient vitals was BP-120/78, HR-150, cbg 161, and 93% on a 100 % oxygen.

## 2018-03-22 NOTE — Progress Notes (Signed)
EEG completed; results pending.    
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
## 2018-03-22 NOTE — ED Notes (Addendum)
Per pts wife at bedside: Pt is at [facility]. Pt has dx of dementia. Pt has seizure disorder. When pt refuses medication the staff at [facility] will just not give medication. Pts wife typically is at facility daily to help encourage pt to take medication, however the facility is on lock down due to COVID-19. Wife has been unable to be at the facility to assist with medications.

## 2018-03-22 NOTE — ED Notes (Signed)
[facility] called and updated on pts ETA

## 2018-03-22 NOTE — Progress Notes (Signed)
Initial Nutrition Assessment  DOCUMENTATION CODES:   Not applicable  INTERVENTION:   If unable to extubate within the next 24 hours, recommend begin TF via OGT:  Vital High Protein at 50 ml/h (1200 ml per day)  Provides 1200 kcal (1625 kcal total with propofol), 105 gm protein, 1003 ml free water daily  NUTRITION DIAGNOSIS:   Inadequate oral intake related to inability to eat as evidenced by NPO status.  GOAL:   Patient will meet greater than or equal to 90% of their needs  MONITOR:   Vent status, Labs, Skin, I & O's  REASON FOR ASSESSMENT:   Ventilator    ASSESSMENT:   68 yo male with PMH of HTN, PVD, COPD, stroke, ETOH abuse, dementia, seizures who was admitted with seizure and AMS requiring intubation.   Breakthrough seizure thought to be related to patient not taking medication at SNF. His wife has been unable to visit and encourage intake of meds due to SNF in lockdown for COVID-19 precautions.   Noted allergy to orange juice, orange oil.   No family present to obtain nutrition hx. Per review of weight encounters, weight has been stable since July 2019.    Patient is currently intubated on ventilator support MV: 8.3 L/min Temp (24hrs), Avg:99 F (37.2 C), Min:98.2 F (36.8 C), Max:100 F (37.8 C)  Propofol: 16.1 ml/hr providing 425 kcal from lipid  Labs reviewed.  Medications reviewed and include propofol.   NUTRITION - FOCUSED PHYSICAL EXAM:    Most Recent Value  Orbital Region  Mild depletion  Upper Arm Region  No depletion  Thoracic and Lumbar Region  No depletion  Buccal Region  Unable to assess  Temple Region  Mild depletion  Clavicle Bone Region  Mild depletion  Clavicle and Acromion Bone Region  Mild depletion  Scapular Bone Region  No depletion  Dorsal Hand  No depletion  Patellar Region  Mild depletion  Anterior Thigh Region  Mild depletion  Posterior Calf Region  Mild depletion  Edema (RD Assessment)  None  Hair  Reviewed  Eyes   Unable to assess  Mouth  Unable to assess  Skin  Reviewed  Nails  Reviewed       Diet Order:   Diet Order            Diet NPO time specified  Diet effective now              EDUCATION NEEDS:   No education needs have been identified at this time  Skin:  Skin Assessment: Reviewed RN Assessment  Last BM:  3/14  Height:   Ht Readings from Last 1 Encounters:  03/22/18 5\' 8"  (1.727 m)    Weight:   Wt Readings from Last 1 Encounters:  03/22/18 63.5 kg    Ideal Body Weight:  70 kg  BMI:  Body mass index is 21.29 kg/m.  Estimated Nutritional Needs:   Kcal:  1685  Protein:  85-100 gm  Fluid:  1.7 L    Joaquin Courts, RD, LDN, CNSC Pager [phone] After Hours Pager [phone]

## 2018-03-22 NOTE — ED Provider Notes (Addendum)
6:13 AM INTUBATION Performed by: Antony Madura  Required items: required blood products, implants, devices, and special equipment available Patient identity confirmed: provided demographic data and hospital-assigned identification number Time out: Immediately prior to procedure a "time out" was called to verify the correct patient, procedure, equipment, support staff and site/side marked as required.  Indications: AMS; GCS <8  Intubation method: Glidescope Laryngoscopy   Preoxygenation: BVM  Sedatives: 15mg  Etomidate Paralytic: 80mg  Rocuronium  Tube Size: 7.5 cuffed  Post-procedure assessment: chest rise and ETCO2 monitor Breath sounds: equal and absent over the epigastrium Tube secured with: ETT holder Chest x-ray interpreted by radiologist and me.  Chest x-ray findings: endotracheal tube in appropriate position  Patient tolerated the procedure well with no immediate complications.      Antony Madura, PA-C 03/22/18 0641    Antony Madura, PA-C 03/22/18 2202    Shaune Pollack, MD 03/23/18 1247

## 2018-03-22 NOTE — Procedures (Signed)
ELECTROENCEPHALOGRAM REPORT   Patient: Lee [name]       Room #: 2R97J EEG No. ID: [id number] Age: 68 y.o.        Sex: male Referring Physician: Agarwala Report Date:  03/22/2018        Interpreting Physician: Thana Farr  History: BURNARD BRUNKHORST is an 68 y.o. male with a history of seizures presenting with confusion and breakthrough seizure activity  Medications:  Proventil, Tegretol, Pepcid, Vimpat, Depacon, Propofol  Conditions of Recording:  This is a 21 channel routine scalp EEG performed with bipolar and monopolar montages arranged in accordance to the international 10/20 system of electrode placement. One channel was dedicated to EKG recording.  The patient is in the intubated and sedated state.  Description:  Muscle artifact is prominent during the recording often obscuring the background rhythm. When able to be visualized the background is discontinuous.  There are periods of attenuation that seem to last up to approximately 5 seconds alternating with periods of background that are a diffusely distributed, disorganized mixture of frequencies.   No epileptiform activity can be appreciated but due to the predominance of artifact can not be ruled out.    Hyperventilation and intermittent photic stimulation were not performed   IMPRESSION: This electroencephalogram is dominated by muscle artifact but when a background can be appreciated it is consistent with medication effect.    Thana Farr, MD Neurology [phone] 03/22/2018, 11:31 AM

## 2018-03-22 NOTE — ED Notes (Signed)
Report given to Justin from [facility]  

## 2018-03-22 NOTE — ED Notes (Signed)
Pt transported to [facility] via [facility]. Pt in critical but stable condition.

## 2018-03-22 NOTE — ED Notes (Addendum)
Attempting to call report on pt to [facility]

## 2018-03-22 NOTE — ED Notes (Signed)
[facility] at bedside to take pt to [facility]

## 2018-03-23 ENCOUNTER — Inpatient Hospital Stay (HOSPITAL_COMMUNITY): Payer: Medicare Other

## 2018-03-23 LAB — GLUCOSE, CAPILLARY
Glucose-Capillary: 81 mg/dL (ref 70–99)
Glucose-Capillary: 86 mg/dL (ref 70–99)
Glucose-Capillary: 80 mg/dL (ref 70–99)

## 2018-03-23 LAB — CBC
HCT: 39.3 % (ref 39.0–52.0)
Hemoglobin: 13 g/dL (ref 13.0–17.0)
MCH: 31.9 pg (ref 26.0–34.0)
MCHC: 33.1 g/dL (ref 30.0–36.0)
MCV: 96.6 fL (ref 80.0–100.0)
Platelets: 261 10*3/uL (ref 150–400)
RBC: 4.07 MIL/uL — ABNORMAL LOW (ref 4.22–5.81)
RDW: 14.2 % (ref 11.5–15.5)
WBC: 16.3 10*3/uL — ABNORMAL HIGH (ref 4.0–10.5)
nRBC: 0 % (ref 0.0–0.2)

## 2018-03-23 LAB — PHOSPHORUS
PHOSPHORUS: 3.7 mg/dL (ref 2.5–4.6)
PHOSPHORUS: 2.7 mg/dL (ref 2.5–4.6)
Phosphorus: 3.8 mg/dL (ref 2.5–4.6)

## 2018-03-23 LAB — BASIC METABOLIC PANEL
Anion gap: 10 (ref 5–15)
BUN: 9 mg/dL (ref 8–23)
CO2: 22 mmol/L (ref 22–32)
Calcium: 8.6 mg/dL — ABNORMAL LOW (ref 8.9–10.3)
Chloride: 110 mmol/L (ref 98–111)
Creatinine, Ser: 1.31 mg/dL — ABNORMAL HIGH (ref 0.61–1.24)
GFR calc Af Amer: >60 mL/min (ref >60)
GFR calc non Af Amer: 56 mL/min — ABNORMAL LOW (ref >60)
Glucose, Bld: 82 mg/dL (ref 70–99)
Potassium: 4.1 mmol/L (ref 3.5–5.1)
SODIUM: 142 mmol/L (ref 135–145)

## 2018-03-23 LAB — POCT I-STAT 7, (LYTES, BLD GAS, ICA,H+H)
Acid-base deficit: 2 mmol/L (ref 0.0–2.0)
Bicarbonate: 23 mmol/L (ref 20.0–28.0)
CALCIUM ION: 1.22 mmol/L (ref 1.15–1.40)
HCT: 36 % — ABNORMAL LOW (ref 39.0–52.0)
Hemoglobin: 12.2 g/dL — ABNORMAL LOW (ref 13.0–17.0)
O2 Saturation: 98 %
POTASSIUM: 3.9 mmol/L (ref 3.5–5.1)
Patient temperature: 98
SODIUM: 141 mmol/L (ref 135–145)
TCO2: 24 mmol/L (ref 22–32)
pCO2 arterial: 37.7 mmHg (ref 32.0–48.0)
pH, Arterial: 7.391 (ref 7.350–7.450)
pO2, Arterial: 114 mmHg — ABNORMAL HIGH (ref 83.0–108.0)

## 2018-03-23 LAB — CARBAMAZEPINE LEVEL, TOTAL: Carbamazepine Lvl: 9.6 ug/mL (ref 4.0–12.0)

## 2018-03-23 LAB — VALPROIC ACID LEVEL: Valproic Acid Lvl: 32 ug/mL — ABNORMAL LOW (ref 50.0–100.0)

## 2018-03-23 LAB — MAGNESIUM
Magnesium: 2.2 mg/dL (ref 1.7–2.4)
Magnesium: 2.1 mg/dL (ref 1.7–2.4)
Magnesium: 1.9 mg/dL (ref 1.7–2.4)

## 2018-03-23 LAB — HIV ANTIBODY (ROUTINE TESTING W REFLEX): HIV SCREEN 4TH GENERATION: NONREACTIVE

## 2018-03-23 MED ORDER — VITAL HIGH PROTEIN PO LIQD: 1000.0000 mL | ORAL | Status: DC

## 2018-03-23 MED ORDER — FAMOTIDINE 20 MG IN NS 100 ML IVPB
20.0000 mg | Freq: Two times a day (BID) | INTRAVENOUS | Status: DC
  Administered 2018-03-23 – 2018-03-25 (×5): 20 mg via INTRAVENOUS
  Filled 2018-03-23 (×5): qty 100

## 2018-03-23 MED ORDER — VALPROATE SODIUM 500 MG/5ML IV SOLN
500.0000 mg | Freq: Three times a day (TID) | INTRAVENOUS | Status: DC
  Administered 2018-03-23 – 2018-03-26 (×8): 500 mg via INTRAVENOUS
  Filled 2018-03-23 (×12): qty 5

## 2018-03-23 MED ORDER — PRO-STAT SUGAR FREE PO LIQD
30.0000 mL | Freq: Two times a day (BID) | ORAL | Status: DC
  Administered 2018-03-23: 30 mL
  Filled 2018-03-23: qty 30

## 2018-03-23 MED ORDER — VITAL HIGH PROTEIN PO LIQD
1000.0000 mL | ORAL | Status: DC
  Administered 2018-03-23 – 2018-03-24 (×2): 1000 mL

## 2018-03-23 MED ORDER — SODIUM CHLORIDE 0.9 % IV SOLN
1.0000 g | INTRAVENOUS | Status: DC
  Administered 2018-03-23 – 2018-03-25 (×3): 1 g via INTRAVENOUS
  Filled 2018-03-23: qty 10
  Filled 2018-03-23: qty 1
  Filled 2018-03-23: qty 10

## 2018-03-23 NOTE — Progress Notes (Signed)
Pharmacy Antibiotic Note  Lee [name] is a 68 y.o. male admitted on 03/22/2018 with UTI.  Pharmacy has been consulted for Rocephin dosing.  PCN allergy, but has tolerated Keflex in the past  Plan: Rocephin 1 gram IV q24hr Monitor C&S and LOT  Height: 5\' 8"  (172.7 cm) Weight: 138 lb 0.1 oz (62.6 kg) IBW/kg (Calculated) : 68.4  Temp (24hrs), Avg:99.6 F (37.6 C), Min:98.2 F (36.8 C), Max:100.4 F (38 C)  Recent Labs  Lab 03/22/18 0557 03/22/18 0721 03/23/18 0637  WBC 14.0*  --  16.3*  CREATININE 1.24 1.21 1.31*    Estimated Creatinine Clearance: 47.8 mL/min (A) (by C-G formula based on SCr of 1.31 mg/dL (H)).    Allergies  Allergen Reactions  . Orange Juice [Orange Oil] Diarrhea  . Penicillins Nausea And Vomiting    Tolerates Zosyn. "might have been an overdose" (01/22/2012) Has patient had a PCN reaction causing immediate rash, facial/tongue/throat swelling, SOB or lightheadedness with hypotension: yes Has patient had a PCN reaction causing severe rash involving mucus membranes or skin necrosis: unknown Has patient had a PCN reaction that required hospitalization: no Has patient had a PCN reaction occurring within the last 10 years: no If all of the above answers are "NO", then may proceed with Cephalosporin u  . Vicodin [Hydrocodone-Acetaminophen] Other (See Comments)    "triggered seizure both here and at home when he tried to take it" (01/22/2012)  . Oxycodone     Causes Seizures    Antimicrobials this admission: Rocephin 3/15 >>   Thank you for allowing pharmacy to be a part of this patient's care.  Jeanella Cara, PharmD, [facility] Clinical Pharmacist Please see AMION for all Pharmacists' Contact Phone Numbers 03/23/2018, 10:17 AM

## 2018-03-23 NOTE — Progress Notes (Signed)
Brief Nutrition Note  Consult received for enteral/tube feeding initiation and management.  Adult Enteral Nutrition Protocol initiated. Patient seen by a RD yesterday. At that time recommendation was made for Vital High Protein @ 50 ml/hr which provides 1200 kcal, 105 grams of protein, and 1003 ml free water. Will place order for Vital High Protein @ 50 ml/hr at this time. Full follow-up assessment to follow.  Admitting Dx: Acute encephalopathy [G93.40]  Body mass index is 20.98 kg/m. Pt meets criteria for normal weight based on current BMI.  Labs:  Recent Labs  Lab 03/22/18 0557 03/22/18 0721 03/23/18 0437 03/23/18 0637 03/23/18 1003  NA 136  --  141 142  --   K 4.2  --  3.9 4.1  --   CL 101  --   --  110  --   CO2 5*  --   --  22  --   BUN 14  --   --  9  --   CREATININE 1.24 1.21  --  1.31*  --   CALCIUM 9.4  --   --  8.6*  --   MG  --   --   --  2.2 2.1  PHOS  --   --   --  3.8 3.7  GLUCOSE 170*  --   --  82  --      Trenton Gammon, MS, RD, LDN, CNSC Inpatient Clinical Dietitian Pager # [phone] After hours/weekend pager # [phone]

## 2018-03-23 NOTE — Plan of Care (Signed)
  Problem: Education: Goal: Expressions of having a comfortable level of knowledge regarding the disease process will increase Outcome: Progressing   Problem: Coping: Goal: Ability to adjust to condition or change in health will improve Outcome: Progressing Goal: Ability to identify appropriate support needs will improve Outcome: Progressing   Problem: Health Behavior/Discharge Planning: Goal: Compliance with prescribed medication regimen will improve Outcome: Progressing   Problem: Medication: Goal: Risk for medication side effects will decrease Outcome: Progressing   Problem: Clinical Measurements: Goal: Complications related to the disease process, condition or treatment will be avoided or minimized Outcome: Progressing Goal: Diagnostic test results will improve Outcome: Progressing   Problem: Safety: Goal: Verbalization of understanding the information provided will improve Outcome: Progressing   Problem: Self-Concept: Goal: Level of anxiety will decrease Outcome: Progressing Goal: Ability to verbalize feelings about condition will improve Outcome: Progressing   Problem: Activity: Goal: Ability to tolerate increased activity will improve Outcome: Progressing   Problem: Respiratory: Goal: Ability to maintain a clear airway and adequate ventilation will improve Outcome: Progressing   Problem: Role Relationship: Goal: Method of communication will improve Outcome: Progressing   Problem: Education: Goal: Knowledge of General Education information will improve Description Including pain rating scale, medication(s)/side effects and non-pharmacologic comfort measures Outcome: Progressing   Problem: Health Behavior/Discharge Planning: Goal: Ability to manage health-related needs will improve Outcome: Progressing   Problem: Clinical Measurements: Goal: Ability to maintain clinical measurements within normal limits will improve Outcome: Progressing Goal: Will  remain free from infection Outcome: Progressing Goal: Diagnostic test results will improve Outcome: Progressing Goal: Respiratory complications will improve Outcome: Progressing Goal: Cardiovascular complication will be avoided Outcome: Progressing   Problem: Activity: Goal: Risk for activity intolerance will decrease Outcome: Progressing   Problem: Nutrition: Goal: Adequate nutrition will be maintained Outcome: Progressing   Problem: Coping: Goal: Level of anxiety will decrease Outcome: Progressing   Problem: Elimination: Goal: Will not experience complications related to bowel motility Outcome: Progressing Goal: Will not experience complications related to urinary retention Outcome: Progressing   Problem: Pain Managment: Goal: General experience of comfort will improve Outcome: Progressing   Problem: Safety: Goal: Ability to remain free from injury will improve Outcome: Progressing   Problem: Skin Integrity: Goal: Risk for impaired skin integrity will decrease Outcome: Progressing

## 2018-03-23 NOTE — Progress Notes (Addendum)
NEURO HOSPITALIST PROGRESS NOTE   Subjective: Patient in bed intubated, sedation is being titrated in hopes to extubate today.  Exam: Vitals:   03/23/18 0800 03/23/18 0900  BP: 122/81 135/81  Pulse: 75 81  Resp: 16 18  Temp: (!) 100.4 F (38 C) (!) 100.4 F (38 C)  SpO2: 100% 100%    Physical Exam  HEENT-  Normocephalic, no lesions, without obvious abnormality.  Normal external eye and conjunctiva.   Cardiovascular- S1-S2 audible, pulses palpable throughout   Lungs-no rhonchi or wheezing noted, no excessive working breathing.  Saturations within normal limits intubated Extremities- Warm, dry and intact Musculoskeletal-no joint tenderness, deformity or swelling Skin-warm and dry,intact  Neurological Examination: Performed while on propofol at 10 mcg/kg/hr Mental Status: Patient is intubated and sedated with propofol. Does not follow commands. Breathing above the vent. Grimaces to pain. In bilateral mittens as he is waking up more and trying to pull out tubes.  Cranial Nerves: Actively resists eye opening . Face appears symmetric in the presence of ETT Motor/Sensory: Moving BUE spontaneously and purposefully.  Patient does have a right hand twitch that wife states he has at baseline. Patient withdraws to noxious in all 4 extremities.  Increased tone in BUE. Normal tone BLE Cerebellar: UTA Gait: deferred    Medications:  Scheduled: . albuterol  5 mg Nebulization Once  . carBAMazepine  400 mg Oral BID  . chlorhexidine gluconate (MEDLINE KIT)  15 mL Mouth Rinse BID  . feeding supplement (PRO-STAT SUGAR FREE 64)  30 mL Per Tube BID  . feeding supplement (VITAL HIGH PROTEIN)  1,000 mL Per Tube Q24H  . heparin  5,000 Units Subcutaneous Q8H  . mouth rinse  15 mL Mouth Rinse 10 times per day   Continuous: . sodium chloride 50 mL/hr at 03/23/18 0850  . famotidine (PEPCID) IV Stopped (03/23/18 0228)  . lacosamide (VIMPAT) IV Stopped (03/22/18 2150)  .  propofol (DIPRIVAN) infusion 40 mcg/kg/min (03/23/18 0853)  . valproate sodium Stopped (03/23/18 0742)   ITG:PQDIYMEBRAXEN, fentaNYL (SUBLIMAZE) injection, ondansetron (ZOFRAN) IV  Pertinent Labs/Diagnostics: 03/23/2018: Valproic acid level: 32 Carbamazepine level:  9.6  Ct Head Wo Contrast  Result Date: 03/22/2018 CLINICAL DATA:  Seizure.  Known seizure history. EXAM: CT HEAD WITHOUT CONTRAST TECHNIQUE: Contiguous axial images were obtained from the base of the skull through the vertex without intravenous contrast. COMPARISON:  07/24/2017 FINDINGS: Brain: Generalized brain atrophy. Chronic small-vessel ischemic changes of the cerebral hemispheric white matter. Old lacunar infarction right upper thalamus. Chronic small-vessel ischemic changes of the cerebral hemispheric white matter. No sign of acute infarction, mass lesion, hemorrhage, hydrocephalus or extra-axial collection. Vascular: There is atherosclerotic calcification of the major vessels at the base of the brain. Skull: Negative Sinuses/Orbits: Clear/normal Other: Intubated. IMPRESSION: No acute finding by CT. Atrophy and chronic small-vessel ischemic changes as outlined above. Electronically Signed   By: Nelson Chimes M.D.   On: 03/22/2018 08:39   Dg Chest Portable 1 View  Result Date: 03/22/2018 CLINICAL DATA:  ETT and OG tube placement EXAM: PORTABLE CHEST 1 VIEW COMPARISON:  03/25/2017 FINDINGS: Endotracheal tube terminates 3.5 cm above the carina. Enteric tube terminates in the proximal gastric body. Lungs are essentially clear.  No pleural effusion or pneumothorax. The heart is normal in size. IMPRESSION: Endotracheal tube terminates 3.5 cm above the carina. Enteric tube terminates in the proximal gastric body. Electronically Signed  By: Julian Hy M.D.   On: 03/22/2018 06:41   rEEG 03/22/2018 IMPRESSION: This electroencephalogram is dominated by muscle artifact but when a background can be appreciated it is consistent with  medication effect.   Assessment: 68 year old male with history of seizures, found unresponsive at SNF. Presented to the ED with probable status epilepticus based on GCS of 3, seizure in ambulance and tachycardia with hypertensive urgency that resolved with propofol administration 1. Breakthrough seizure most likely due to medication noncompliance. Patient has a history of not taking meds unless his wife supervises/encourages him to do so. The SNF was in lockdown for coronavirus precautions and hence wife has been unable to visit the patient.  2. EEG is negative for electrographic seizure. Diffuse slowing noted. 3. CBZ level therapeutic at 9.6 today. 4. VPA level low at 32 today.    Recommendations:  - Continue Vimpat, Tegretol and Keppra at current doses - Increase valproic acid dosing to 500 mg IV TID  - Check/monitor valproic acid levels.until therapeutic - Seizure precautions - Continue to monitor for clinical improvement   Laurey Morale, MSN, NP-C [facility] [phone]  Electronically signed: Dr. Kerney Elbe 03/23/2018, 10:03 AM

## 2018-03-23 NOTE — Progress Notes (Signed)
   NAME:  Lee [name], MRN:  [id number], DOB:  December 29, 1950, LOS: 1 ADMISSION DATE:  03/22/2018, CONSULTATION DATE:  3/14 REFERRING MD:  Erma Heritage, CHIEF COMPLAINT:  Seizure    Brief History   67 yom dementia and seizures admitted 3/14 w/ seizure and altered mental status.    Past Medical History  Dementia, prior CVA, history of epilepsy, hypertension Significant Hospital Events   3/14: Admitted for possible seizure intubated for airway protection placed on propofol.  Re-challenged with AEDs 3/15: EEG was negative for seizure.  No seizures overnight.  Beginning to taper propofol.  Starting Rocephin for possible UTI Consults:  neuro  Procedures:  OETT 3/14>>>  Significant Diagnostic Tests:  CT 3/14: No acute finding by CT. Atrophy and chronic small-vessel ischemic changes as outlined above. EEG 3/14: Negative for seizure  Micro Data:   UC 3/14>>> BC 3/14>>>  Antimicrobials:   Rocephin 3/15 Interim history/subjective:  Sedated   Objective   Blood pressure 135/81, pulse 81, temperature (Abnormal) 100.4 F (38 C), resp. rate 18, height 5\' 8"  (1.727 m), weight 62.6 kg, SpO2 100 %.    Vent Mode: PRVC FiO2 (%):  [40 %-100 %] 40 % Set Rate:  [16 bmp] 16 bmp Vt Set:  [540 mL] 540 mL PEEP:  [5 cmH20] 5 cmH20 Plateau Pressure:  [14 cmH20-16 cmH20] 14 cmH20   Intake/Output Summary (Last 24 hours) at 03/23/2018 4818 Last data filed at 03/23/2018 0800 Gross per 24 hour  Intake 1779.02 ml  Output 1425 ml  Net 354.02 ml   Filed Weights   03/22/18 0616 03/22/18 1025 03/23/18 0500  Weight: 67 kg 63.5 kg 62.6 kg    Examination: General: Heavily sedated on propofol infusion winces to noxious stimulus only HEENT normocephalic atraumatic no jugular venous distention Pulmonary: Clear to auscultation diminished bases Cardiac: Regular rate and rhythm without murmur rub or gallop Abdomen: Soft nontender no organomegaly Extremities: Warm dry brisk cap refill no edema Neuro:  Heavily sedated grimaces to noxious him GU: Cloudy urine  Resolved Hospital Problem list    Anion gap metabolic acidosis Assessment & Plan:   Seizure: Presumptively 2/2 not taking meds, no further seizures since admission; also wonder if underlying UTI could have contributed Plan Placing NG tube to ensure he gets his AEDs Weaning propofol for rascal of 0 Serial neuro checks  Acute metabolic encephalopathy superimposed on underlying dementia Heavily sedated EEG negative for seizure Plan Continue supportive care  Need for mechanical ventilation  Portable chest x-ray personally reviewed was clear Plan Continuing full ventilator support VAP bundle Initiate weaning trials and spontaneous breathing trial once mental status allows  Leukocytosis, with suspicious urinalysis worrisome for UTI White blood cell count continues to rise Plan DC Foley placed condom catheter Will start IV Rocephin, as tolerated cephalosporins in the past  Mild AKI Plan Continue IV fluids Treating urinary tract infection Repeat a.m. chemistry    Best practice:  Diet: NPO Pain/Anxiety/Delirium protocol (if indicated): 3/14 VAP protocol (if indicated): 3/14 DVT prophylaxis: [place] heparin GI prophylaxis: H2B Glucose control: ssi Mobility: BR Code Status: full code  Family Communication: pending Disposition: Remains critically ill due to his requirement for mechanical ventilation, and frequent neuro checks as we taper propofol.  Goal for today will need to initiate weaning attempts once we get him off sedation    Critical care time:       Simonne Martinet ACNP-BC [facility] Pulmonary/Critical Care Pager # [phone] OR # [phone] if no answer

## 2018-03-24 DIAGNOSIS — G40901 Epilepsy, unspecified, not intractable, with status epilepticus: Secondary | ICD-10-CM

## 2018-03-24 LAB — CBC
HCT: 39.1 % (ref 39.0–52.0)
Hemoglobin: 12.7 g/dL — ABNORMAL LOW (ref 13.0–17.0)
MCH: 31.7 pg (ref 26.0–34.0)
MCHC: 32.5 g/dL (ref 30.0–36.0)
MCV: 97.5 fL (ref 80.0–100.0)
Platelets: 263 10*3/uL (ref 150–400)
RBC: 4.01 MIL/uL — ABNORMAL LOW (ref 4.22–5.81)
RDW: 14.1 % (ref 11.5–15.5)
WBC: 11.9 10*3/uL — ABNORMAL HIGH (ref 4.0–10.5)
nRBC: 0 % (ref 0.0–0.2)

## 2018-03-24 LAB — PHOSPHORUS: Phosphorus: 3 mg/dL (ref 2.5–4.6)

## 2018-03-24 LAB — COMPREHENSIVE METABOLIC PANEL
ALT: 27 U/L (ref 0–44)
AST: 34 U/L (ref 15–41)
Albumin: 2.9 g/dL — ABNORMAL LOW (ref 3.5–5.0)
Alkaline Phosphatase: 74 U/L (ref 38–126)
Anion gap: 9 (ref 5–15)
BUN: 12 mg/dL (ref 8–23)
CO2: 21 mmol/L — ABNORMAL LOW (ref 22–32)
Calcium: 8.5 mg/dL — ABNORMAL LOW (ref 8.9–10.3)
Chloride: 113 mmol/L — ABNORMAL HIGH (ref 98–111)
Creatinine, Ser: 1.12 mg/dL (ref 0.61–1.24)
GFR calc Af Amer: >60 mL/min (ref >60)
GFR calc non Af Amer: >60 mL/min (ref >60)
Glucose, Bld: 75 mg/dL (ref 70–99)
Potassium: 3.4 mmol/L — ABNORMAL LOW (ref 3.5–5.1)
Sodium: 143 mmol/L (ref 135–145)
Total Bilirubin: 0.6 mg/dL (ref 0.3–1.2)
Total Protein: 6.1 g/dL — ABNORMAL LOW (ref 6.5–8.1)

## 2018-03-24 LAB — GLUCOSE, CAPILLARY
GLUCOSE-CAPILLARY: 107 mg/dL — AB (ref 70–99)
Glucose-Capillary: 71 mg/dL (ref 70–99)
Glucose-Capillary: 78 mg/dL (ref 70–99)
Glucose-Capillary: 87 mg/dL (ref 70–99)
Glucose-Capillary: 87 mg/dL (ref 70–99)
Glucose-Capillary: 88 mg/dL (ref 70–99)
Glucose-Capillary: 94 mg/dL (ref 70–99)

## 2018-03-24 LAB — MAGNESIUM: Magnesium: 2 mg/dL (ref 1.7–2.4)

## 2018-03-24 LAB — URINE CULTURE
Culture: 80000 — AB
Special Requests: NORMAL

## 2018-03-24 MED ORDER — VITAL 1.5 CAL PO LIQD
1000.0000 mL | ORAL | Status: DC
  Administered 2018-03-24: 1000 mL
  Filled 2018-03-24 (×3): qty 1000

## 2018-03-24 MED ORDER — LEVETIRACETAM IN NACL 1500 MG/100ML IV SOLN
1500.0000 mg | Freq: Two times a day (BID) | INTRAVENOUS | Status: DC
  Administered 2018-03-24 – 2018-03-26 (×4): 1500 mg via INTRAVENOUS
  Filled 2018-03-24 (×5): qty 100

## 2018-03-24 MED ORDER — ORAL CARE MOUTH RINSE
15.0000 mL | Freq: Two times a day (BID) | OROMUCOSAL | Status: DC
  Administered 2018-03-25 – 2018-03-26 (×2): 15 mL via OROMUCOSAL

## 2018-03-24 NOTE — Progress Notes (Signed)
NAME:  Lee [name], MRN:  [id number], DOB:  05/02/1950, LOS: 2 ADMISSION DATE:  03/22/2018, CONSULTATION DATE:  3/14 REFERRING MD:  Erma HeritageIsaacs, CHIEF COMPLAINT:  Seizure   Brief History   68 y/o male with dementia, seizures admitted 3/14 with seizure, change in mental status.   Past Medical History  Dementia, prior CVA, history of epilepsy, hypertension Significant Hospital Events   3/14: Admitted for possible seizure intubated for airway protection placed on propofol.  Re-challenged with AEDs 3/15: EEG was negative for seizure.  No seizures overnight.  Beginning to taper propofol.  Starting Rocephin for possible UTI Consults:  neuro  Procedures:  OETT 3/14>>>  Significant Diagnostic Tests:  CT 3/14: No acute finding by CT. Atrophy and chronic small-vessel ischemic changes as outlined above. EEG 3/14: Negative for seizure  Micro Data:   UC 3/14>>> BC 3/14>>>  Antimicrobials:   Rocephin 3/15  Interim history/subjective:  More awake Passing SBT   Objective   Blood pressure 119/69, pulse 86, temperature 99.8 F (37.7 C), temperature source Axillary, resp. rate 13, height 5\' 8"  (1.727 m), weight 63.1 kg, SpO2 100 %.    Vent Mode: PSV;CPAP FiO2 (%):  [40 %] 40 % Set Rate:  [16 bmp] 16 bmp Vt Set:  [540 mL] 540 mL PEEP:  [5 cmH20] 5 cmH20 Pressure Support:  [5 cmH20-8 cmH20] 8 cmH20 Plateau Pressure:  [13 cmH20-18 cmH20] 13 cmH20   Intake/Output Summary (Last 24 hours) at 03/24/2018 1256 Last data filed at 03/24/2018 1200 Gross per 24 hour  Intake 1907.1 ml  Output 975 ml  Net 932.1 ml   Filed Weights   03/22/18 1025 03/23/18 0500 03/24/18 0500  Weight: 63.5 kg 62.6 kg 63.1 kg    Examination:  General:  In bed on vent HENT: NCAT ETT in place PULM: CTA B, vent supported breathing CV: RRR, no mgr GI: BS+, soft, nontender MSK: normal bulk and tone Neuro: awake on vent, drowsy but will open eyes   Resolved Hospital Problem list   Metabolic  acidosis AKI  Assessment & Plan:  Seizures Plan: Continue tegretol, vimpat, depakote per neurology  Acute metabolic encephalopathy: improved Minimize sedating medications  Acute respiratory failure with hypoxemia due to inability to protect airway Extubate today Aspiration precautions  Leukocytosis: aerococcus urinae? Continue ceftriaxone, plan 3 days then re-assess  COPD by history Prn albuterol  Best practice:  Diet: tube feeding Pain/Anxiety/Delirium protocol (if indicated): cancel VAP protocol (if indicated): yes DVT prophylaxis: sub q hep GI prophylaxis: famotidine Glucose control: n/a, monitor glucose Mobility: bed rest Code Status: full Family Communication: none bedside Disposition: remain in ICU  Labs   CBC: Recent Labs  Lab 03/22/18 0557 03/23/18 0437 03/23/18 0637 03/24/18 0525  WBC 14.0*  --  16.3* 11.9*  NEUTROABS 11.6*  --   --   --   HGB 14.0 12.2* 13.0 12.7*  HCT 45.7 36.0* 39.3 39.1  MCV 105.3*  --  96.6 97.5  PLT 388  --  261 263    Basic Metabolic Panel: Recent Labs  Lab 03/22/18 0557 03/22/18 0721 03/23/18 0437 03/23/18 0637 03/23/18 1003 03/23/18 1629 03/24/18 0525  NA 136  --  141 142  --   --  143  K 4.2  --  3.9 4.1  --   --  3.4*  CL 101  --   --  110  --   --  113*  CO2 5*  --   --  22  --   --  21*  GLUCOSE 170*  --   --  82  --   --  75  BUN 14  --   --  9  --   --  12  CREATININE 1.24 1.21  --  1.31*  --   --  1.12  CALCIUM 9.4  --   --  8.6*  --   --  8.5*  MG  --   --   --  2.2 2.1 1.9 2.0  PHOS  --   --   --  3.8 3.7 2.7 3.0   GFR: Estimated Creatinine Clearance: 56.3 mL/min (by C-G formula based on SCr of 1.12 mg/dL). Recent Labs  Lab 03/22/18 0557 03/23/18 0637 03/24/18 0525  WBC 14.0* 16.3* 11.9*    Liver Function Tests: Recent Labs  Lab 03/22/18 0557 03/24/18 0525  AST 42* 34  ALT <5 27  ALKPHOS 98 74  BILITOT 0.3 0.6  PROT 7.6 6.1*  ALBUMIN 4.0 2.9*   No results for input(s): LIPASE,  AMYLASE in the last 168 hours. No results for input(s): AMMONIA in the last 168 hours.  ABG    Component Value Date/Time   PHART 7.391 03/23/2018 0437   PCO2ART 37.7 03/23/2018 0437   PO2ART 114.0 (H) 03/23/2018 0437   HCO3 23.0 03/23/2018 0437   TCO2 24 03/23/2018 0437   ACIDBASEDEF 2.0 03/23/2018 0437   O2SAT 98.0 03/23/2018 0437     Coagulation Profile: No results for input(s): INR, PROTIME in the last 168 hours.  Cardiac Enzymes: No results for input(s): CKTOTAL, CKMB, CKMBINDEX, TROPONINI in the last 168 hours.  HbA1C: Hgb A1c MFr Bld  Date/Time Value Ref Range Status  11/07/2013 06:24 AM 6.1 (H) <5.7 % Final    Comment:    (NOTE)                                                                       According to the ADA Clinical Practice Recommendations for 2011, when HbA1c is used as a screening test:  >=6.5%   Diagnostic of Diabetes Mellitus           (if abnormal result is confirmed) 5.7-6.4%   Increased risk of developing Diabetes Mellitus References:Diagnosis and Classification of Diabetes Mellitus,Diabetes Care,2011,34(Suppl 1):S62-S69 and Standards of Medical Care in         Diabetes - 2011,Diabetes Care,2011,34 (Suppl 1):S11-S61.  09/12/2013 01:35 PM 6.0 (H) <5.7 % Final    Comment:    (NOTE)                                                                       According to the ADA Clinical Practice Recommendations for 2011, when HbA1c is used as a screening test:  >=6.5%   Diagnostic of Diabetes Mellitus           (if abnormal result is confirmed) 5.7-6.4%   Increased risk of developing Diabetes Mellitus References:Diagnosis and Classification of Diabetes Mellitus,Diabetes Care,2011,34(Suppl 1):S62-S69 and Standards of Medical Care  in         Diabetes - 2011,Diabetes Care,2011,34 (Suppl 1):S11-S61.    CBG: Recent Labs  Lab 03/23/18 2051 03/24/18 0004 03/24/18 0441 03/24/18 0726 03/24/18 1207  GLUCAP 80 87 78 71 107*       Critical care  time: 35 minutes     Heber [place]ne[place], MD [place] [facility] Pager: [phone] Cell: [phone] If no response, call [phone]

## 2018-03-24 NOTE — Progress Notes (Signed)
Late vent entry

## 2018-03-24 NOTE — Progress Notes (Signed)
Nutrition Follow-up  DOCUMENTATION CODES:   Not applicable  INTERVENTION:   Modify TF regimen via NG tube as follows:  Vital 1.5 @ 50 ml/hr - Provides 1200 mL formula, 1800 kcal, 81 grams of protein, and 912 ml free water. This meets 100% of estimated energy and protein needs.   NUTRITION DIAGNOSIS:   Inadequate oral intake related to inability to eat as evidenced by NPO status.  Ongoing  GOAL:   Patient will meet greater than or equal to 90% of their needs  Meeting with TF regimen  MONITOR:   Vent status, Labs, Skin, I & O's  REASON FOR ASSESSMENT:   Ventilator    ASSESSMENT:   68 yo male with PMH of HTN, PVD, COPD, stroke, ETOH abuse, dementia, seizures who was admitted with seizure and AMS requiring intubation.  Patient is currently intubated on ventilator support MV: 7.8 L/min Temp (24hrs), Avg:99.2 F (37.3 C), Min:98.6 F (37 C), Max:99.8 F (37.7 C)  Propofol: 6 ml/hr provides 158 kcal/day from lipid  Labs reviewed: chloride 113 (H) Medications reviewed and include: Pepcid, Vital High Protein, heparin 5000 units q 8 hours  No family present to obtain nutrition hx  Per RN, pt weaning today, possible extubation 1-2 days.  Diet Order:  Allergy: orange juice, orange oil Diet Order            Diet NPO time specified  Diet effective now              EDUCATION NEEDS:  No education needs have been identified at this time  Skin:  Skin Assessment: Reviewed RN Assessment  Last BM:  3/14  Height:  Ht Readings from Last 1 Encounters:  03/22/18 5\' 8"  (1.727 m)    Weight:  Wt Readings from Last 5 Encounters:  03/24/18 63.1 kg  07/31/17 66.5 kg  07/24/17 63 kg  03/25/17 68 kg  08/29/16 68.7 kg  Wt stable x 7 months  Ideal Body Weight:  70 kg  BMI:  Body mass index is 21.15 kg/m. normal  Estimated Nutritional Needs: updated 3/16  Kcal:  1635  Protein:  76-95 gm  Fluid:  1.7 L  Jolaine Artist, MS, RDN, LDN Pager:  [phone] Available Mondays and Fridays, 9am-2pm

## 2018-03-24 NOTE — Procedures (Signed)
Extubation Procedure Note  Patient Details:   Name: FRENCH LAMBERT DOB: 01/17/1950 MRN: [id number]   Airway Documentation:    Vent end date: 03/24/18 Vent end time: 0210   Evaluation  O2 sats: stable throughout Complications: No apparent complications Patient did tolerate procedure well. Bilateral Breath Sounds: Diminished, Clear   Yes   Patient extubated to 3L [place] without complications. Positive cuff leak noted. RN at bedside. Vitals are stable. Will continue to monitor.  Rance Muir 03/24/2018, 2:12 PM

## 2018-03-25 DIAGNOSIS — F0150 Vascular dementia without behavioral disturbance: Secondary | ICD-10-CM

## 2018-03-25 LAB — GLUCOSE, CAPILLARY
Glucose-Capillary: 89 mg/dL (ref 70–99)
Glucose-Capillary: 100 mg/dL — ABNORMAL HIGH (ref 70–99)
Glucose-Capillary: 81 mg/dL (ref 70–99)

## 2018-03-25 LAB — CULTURE, RESPIRATORY W GRAM STAIN
Culture: NORMAL
Special Requests: NORMAL

## 2018-03-25 LAB — CULTURE, RESPIRATORY

## 2018-03-25 MED ORDER — ENSURE ENLIVE PO LIQD
237.0000 mL | Freq: Two times a day (BID) | ORAL | Status: DC
  Administered 2018-03-25 – 2018-03-26 (×2): 237 mL via ORAL

## 2018-03-25 MED ORDER — RESOURCE THICKENUP CLEAR PO POWD
ORAL | Status: DC | PRN
  Filled 2018-03-25: qty 125

## 2018-03-25 MED ORDER — FAMOTIDINE 40 MG/5ML PO SUSR
20.0000 mg | Freq: Two times a day (BID) | ORAL | Status: DC
  Administered 2018-03-25 – 2018-03-26 (×2): 20 mg
  Filled 2018-03-25 (×2): qty 2.5

## 2018-03-25 MED ORDER — ALBUTEROL SULFATE (2.5 MG/3ML) 0.083% IN NEBU: 3.0000 mL | INHALATION_SOLUTION | Freq: Four times a day (QID) | RESPIRATORY_TRACT | Status: DC | PRN
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
## 2018-03-25 NOTE — Progress Notes (Signed)
NAME:  Lee [name], MRN:  [id number], DOB:  1950-08-29, LOS: 3 ADMISSION DATE:  03/22/2018, CONSULTATION DATE:  3/14 REFERRING MD:  Erma Heritage, CHIEF COMPLAINT:  Seizure   Brief History   68 y/o male with dementia, seizures admitted 3/14 with seizure, change in mental status.   Past Medical History  Dementia, prior CVA, history of epilepsy, hypertension Significant Hospital Events   3/14: Admitted for possible seizure intubated for airway protection placed on propofol.  Re-challenged with AEDs 3/15: EEG was negative for seizure.  No seizures overnight.  Beginning to taper propofol.  Starting Rocephin for possible UTI Consults:  neuro  Procedures:  OETT 3/14>>> 3/16  Significant Diagnostic Tests:  CT 3/14: No acute finding by CT. Atrophy and chronic small-vessel ischemic changes as outlined above. EEG 3/14: Negative for seizure  Micro Data:   UC 3/14>>> BC 3/14>>>  Antimicrobials:   Rocephin 3/15  Interim history/subjective:  Extubated yesterday SLP eval today PT worked with him Out of bed No foley  Objective   Blood pressure (!) 141/75, pulse 71, temperature 99.7 F (37.6 C), temperature source Oral, resp. rate 15, height 5\' 8"  (1.727 m), weight 63.1 kg, SpO2 100 %.    Vent Mode: PSV;CPAP FiO2 (%):  [40 %] 40 % PEEP:  [5 cmH20] 5 cmH20 Pressure Support:  [8 cmH20] 8 cmH20   Intake/Output Summary (Last 24 hours) at 03/25/2018 1022 Last data filed at 03/25/2018 0900 Gross per 24 hour  Intake 2482.02 ml  Output 975 ml  Net 1507.02 ml   Filed Weights   03/22/18 1025 03/23/18 0500 03/24/18 0500  Weight: 63.5 kg 62.6 kg 63.1 kg    Examination:  General:  Resting comfortably in chair HENT: NCAT OP clear PULM: CTA B, normal effort CV: RRR, no mgr GI: BS+, soft, nontender MSK: normal bulk and tone Neuro: awake, sitting up in chair, doesn't follow commands    Resolved Hospital Problem list   Metabolic acidosis AKI  Assessment & Plan:   Seizures Plan: Continue tegretol, vimpat, depakote per neurology  Acute metabolic encephalopathy: improved Dementia baseline: requires assistance with all ADL's, incontinent baseline Minimize sedating medicines PT/OT consult  Acute respiratory failure with hypoxemia due to inability to protect airway: resolved Aspiration precautions SLP evaluation  Leukocytosis: aerococcus urinae? Stop ceftriaxone after today's dose  COPD by history Prn albuterol  Best practice:  Diet: tube feeding Pain/Anxiety/Delirium protocol (if indicated): no VAP protocol (if indicated): no DVT prophylaxis: sub q hep GI prophylaxis: famotidine Glucose control: n/a, monitor glucose Mobility: bed rest Code Status: full Family Communication: none bedside Disposition: move to floor, TRH service  Labs   CBC: Recent Labs  Lab 03/22/18 0557 03/23/18 0437 03/23/18 0637 03/24/18 0525  WBC 14.0*  --  16.3* 11.9*  NEUTROABS 11.6*  --   --   --   HGB 14.0 12.2* 13.0 12.7*  HCT 45.7 36.0* 39.3 39.1  MCV 105.3*  --  96.6 97.5  PLT 388  --  261 263    Basic Metabolic Panel: Recent Labs  Lab 03/22/18 0557 03/22/18 0721 03/23/18 0437 03/23/18 0637 03/23/18 1003 03/23/18 1629 03/24/18 0525  NA 136  --  141 142  --   --  143  K 4.2  --  3.9 4.1  --   --  3.4*  CL 101  --   --  110  --   --  113*  CO2 5*  --   --  22  --   --  21*  GLUCOSE 170*  --   --  82  --   --  75  BUN 14  --   --  9  --   --  12  CREATININE 1.24 1.21  --  1.31*  --   --  1.12  CALCIUM 9.4  --   --  8.6*  --   --  8.5*  MG  --   --   --  2.2 2.1 1.9 2.0  PHOS  --   --   --  3.8 3.7 2.7 3.0   GFR: Estimated Creatinine Clearance: 56.3 mL/min (by C-G formula based on SCr of 1.12 mg/dL). Recent Labs  Lab 03/22/18 0557 03/23/18 0637 03/24/18 0525  WBC 14.0* 16.3* 11.9*    Liver Function Tests: Recent Labs  Lab 03/22/18 0557 03/24/18 0525  AST 42* 34  ALT <5 27  ALKPHOS 98 74  BILITOT 0.3 0.6  PROT 7.6 6.1*   ALBUMIN 4.0 2.9*   No results for input(s): LIPASE, AMYLASE in the last 168 hours. No results for input(s): AMMONIA in the last 168 hours.  ABG    Component Value Date/Time   PHART 7.391 03/23/2018 0437   PCO2ART 37.7 03/23/2018 0437   PO2ART 114.0 (H) 03/23/2018 0437   HCO3 23.0 03/23/2018 0437   TCO2 24 03/23/2018 0437   ACIDBASEDEF 2.0 03/23/2018 0437   O2SAT 98.0 03/23/2018 0437     Coagulation Profile: No results for input(s): INR, PROTIME in the last 168 hours.  Cardiac Enzymes: No results for input(s): CKTOTAL, CKMB, CKMBINDEX, TROPONINI in the last 168 hours.  HbA1C: Hgb A1c MFr Bld  Date/Time Value Ref Range Status  11/07/2013 06:24 AM 6.1 (H) <5.7 % Final    Comment:    (NOTE)                                                                       According to the ADA Clinical Practice Recommendations for 2011, when HbA1c is used as a screening test:  >=6.5%   Diagnostic of Diabetes Mellitus           (if abnormal result is confirmed) 5.7-6.4%   Increased risk of developing Diabetes Mellitus References:Diagnosis and Classification of Diabetes Mellitus,Diabetes Care,2011,34(Suppl 1):S62-S69 and Standards of Medical Care in         Diabetes - 2011,Diabetes Care,2011,34 (Suppl 1):S11-S61.  09/12/2013 01:35 PM 6.0 (H) <5.7 % Final    Comment:    (NOTE)                                                                       According to the ADA Clinical Practice Recommendations for 2011, when HbA1c is used as a screening test:  >=6.5%   Diagnostic of Diabetes Mellitus           (if abnormal result is confirmed) 5.7-6.4%   Increased risk of developing Diabetes Mellitus References:Diagnosis and Classification of Diabetes Mellitus,Diabetes Care,2011,34(Suppl 1):S62-S69 and Standards of Medical Care  in         Diabetes - 2011,Diabetes Care,2011,34 (Suppl 1):S11-S61.    CBG: Recent Labs  Lab 03/24/18 1517 03/24/18 1932 03/24/18 2308 03/25/18 0316 03/25/18 0746   GLUCAP 87 88 94 89 100*       Critical care time: n/a     Heber Galveston, MD [place] [facility] Pager: [phone] Cell: [phone] If no response, call [phone]

## 2018-03-25 NOTE — Progress Notes (Signed)
Patient arrived to 3W08. Alert to self and place. No family present at this time. Will continue to monitor.

## 2018-03-25 NOTE — TOC Initial Note (Signed)
Transition of Care [facility]) - Initial/Assessment Note    Patient Details  Name: Lee [name] MRN: [id number] Date of Birth: 11/19/1950  Transition of Care [facility]) CM/SW Contact:    Baldemar Lenis, LCSW Phone Number: 03/25/2018, 3:52 PM  Clinical Narrative:  Patient long term care resident at [facility]. Director of Nursing at [facility] is working to create a plan for patient to get his seizure medications from his wife during facility lock-down at this time; patient will only take his seizure medications from his wife.                  Expected Discharge Plan: Skilled Nursing Facility Barriers to Discharge: Continued Medical Work up   Patient Goals and CMS Choice Patient states their goals for this hospitalization and ongoing recovery are:: patient unable to participate in goal setting at this time CMS Medicare.gov Compare Post Acute Care list provided to:: Patient Represenative (must comment) Choice offered to / list presented to : Spouse  Expected Discharge Plan and Services Expected Discharge Plan: Skilled Nursing Facility   Post Acute Care Choice: Skilled Nursing Facility                            Prior Living Arrangements/Services   Lives with:: Self, Facility Resident Patient language and need for interpreter reviewed:: No Do you feel safe going back to the place where you live?: Yes      Need for Family Participation in Patient Care: Yes (Comment) Care giver support system in place?: Yes (comment)   Criminal Activity/Legal Involvement Pertinent to Current Situation/Hospitalization: No - Comment as needed  Activities of Daily Living Home Assistive Devices/Equipment: None ADL Screening (condition at time of admission) Patient's cognitive ability adequate to safely complete daily activities?: No Is the patient deaf or have difficulty hearing?: No Does the patient have difficulty seeing, even when wearing glasses/contacts?: Yes Does the patient have difficulty  concentrating, remembering, or making decisions?: Yes Patient able to express need for assistance with ADLs?: No Does the patient have difficulty dressing or bathing?: Yes Independently performs ADLs?: No Does the patient have difficulty walking or climbing stairs?: Yes Weakness of Legs: Both Weakness of Arms/Hands: Both  Permission Sought/Granted Permission sought to share information with : Facility Medical sales representative, Family Supports Permission granted to share information with : Yes, Verbal Permission Granted  Share Information with NAME: [name]  Permission granted to share info w AGENCY: [facility]  Permission granted to share info w Relationship: Wife     Emotional Assessment Appearance:: Appears stated age Attitude/Demeanor/Rapport: Unable to Assess Affect (typically observed): Unable to Assess Orientation: : Oriented to Self, Oriented to Place Alcohol / Substance Use: Not Applicable Psych Involvement: No (comment)  Admission diagnosis:  Acute encephalopathy [G93.40] Patient Active Problem List   Diagnosis Date Noted  . Seizure (HCC) 08/29/2016  . Leukocytosis 08/29/2016  . History of CVA (cerebrovascular accident) 08/29/2016  . Somnolence 08/29/2016  . Post-ictal state (HCC) 08/29/2016  . Lip laceration   . Pain   . Leg weakness, bilateral   . Leg pain, bilateral 08/17/2016  . UTI (urinary tract infection) 08/15/2016  . Aggression 07/26/2016  . Dementia with behavioral disturbance (HCC) 07/26/2016  . Elevated troponin 12/30/2015  . Chest pain 12/30/2015  . Pneumonia, likely aspiration 11/15/2013  . Status epilepticus (HCC) 11/14/2013  . Essential hypertension 11/14/2013  . Cerebral thrombosis with cerebral infarction (HCC) 11/07/2013  . Seizures (HCC) 11/06/2013  . Chronic airway  obstruction, not elsewhere classified 09/22/2013  . Cerebral infarction due to unspecified mechanism 09/22/2013  . Hyperlipidemia 09/18/2013  . Encephalopathy acute 09/11/2013  .  Altered mental status 09/11/2013  . CVA (cerebral infarction) 09/11/2013  . Hyponatremia 09/11/2013  . Acute encephalopathy 09/11/2013  . Aftercare following surgery of the circulatory system, NEC 08/25/2012  . Foot swelling 02/04/2012  . Drainage from wound 01/04/2012  . Peripheral vascular disease, unspecified (HCC) 11/05/2011  . Pain in limb 11/05/2011  . 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 PAD (peripheral artery disease) (HCC) 10/28/2011  . TOBACCO ABUSE 02/06/2007  . SYMPTOM, EDEMA 06/13/2006  . ERECTILE DYSFUNCTION 01/25/2006   PCP:  Mirna Mires, MD Pharmacy:  No Pharmacies Listed    Social Determinants of Health (SDOH) Interventions    Readmission Risk Interventions 30 Day Unplanned Readmission Risk Score     ED to Hosp-Admission (Current) from 03/22/2018 in [facility]  30 Day Unplanned Readmission Risk Score (%)  22 Filed at 03/25/2018 1200     This score is the patient's risk of an unplanned readmission within 30 days of being discharged (0 -100%). The score is based on dignosis, age, lab data, medications, orders, and past utilization.   Low:  0-14.9   Medium: 15-21.9   High: 22-29.9   Extreme: 30 and above       No flowsheet data found.

## 2018-03-25 NOTE — Progress Notes (Signed)
Nutrition Follow-up  DOCUMENTATION CODES:   Not applicable  INTERVENTION:   Ensure Enlive po BID, each supplement provides 350 kcal and 20 grams of protein   NUTRITION DIAGNOSIS:   Inadequate oral intake related to dysphagia as evidenced by meal completion < 50%.  Ongoing  GOAL:   Patient will meet greater than or equal to 90% of their needs  Progressing.   MONITOR:   PO intake, Supplement acceptance  ASSESSMENT:   68 yo male with PMH of HTN, PVD, COPD, stroke, ETOH abuse, dementia, seizures who was admitted with seizure and AMS requiring intubation.  3/16 extubated 3/17 started on dysphagia 1 diet with nectar thickened liquids  Spoke with SLP, per SLP will monitor diet tolerance and possibly complete a MBS tomorrow.  Pt being transferred to progressive care.  Orange allergy noted in healthtouch  Diet Order:  Allergy: orange juice, orange oil Diet Order            DIET - DYS 1 Room service appropriate? Yes with Assist; Fluid consistency: Nectar Thick  Diet effective now              EDUCATION NEEDS:  No education needs have been identified at this time  Skin:  Skin Assessment: Reviewed RN Assessment  Last BM:  3/14  Height:  Ht Readings from Last 1 Encounters:  03/22/18 5\' 8"  (1.727 m)    Weight:  Wt Readings from Last 5 Encounters:  03/24/18 63.1 kg  07/31/17 66.5 kg  07/24/17 63 kg  03/25/17 68 kg  08/29/16 68.7 kg  Wt stable x 7 months  Ideal Body Weight:  70 kg  BMI:  Body mass index is 21.15 kg/m. normal  Estimated Nutritional Needs:  Kcal:  1600-1800  Protein:  75-85 grams  Fluid:  1.7 L  Kendell Bane RD, LDN, CNSC [phone] Pager [phone] After Hours Pager

## 2018-03-25 NOTE — Progress Notes (Signed)
Patient is markedly improved.  He is awake, alert, oriented to person, place, not month or year.  He has dementia at baseline, and lives in a memory care unit.  I suspect he is getting close to the baseline.  His presentation was due to noncompliance due to his wife not being allowed to visit his nursing home to encourage p.o. medication intake.  I am concerned that this will be a recurrent problem if she is not allowed to visit.  I am not sure if there is any work around, but the nursing home will have to work with the patient noncompliance.  Continue home antiepileptics on discharge.  No further recommendations at this time, please call with further questions or concerns.  Ritta Slot, MD [facility] [phone]  If 7pm- 7am, please page neurology on call as listed in AMION.

## 2018-03-25 NOTE — Evaluation (Signed)
Physical Therapy Evaluation Patient Details Name: Lee [name] MRN: [id number] DOB: 08/06/1950 Today's Date: 03/25/2018   History of Present Illness  51 yom dementia and seizures admitted 3/14 with seizure and altered mental status. PMH: epilepsy, prior strokes. Resides in SNF - [facility].  Clinical Impression  Pt admitted with above. Suspect patient is near baseline. Pt was non-ambulatory PTA, with maxAX2 pt completed std pvt to chair today. Pt can be aggressive and irritable however tolerated treatment well. Pt with h/o dementia limiting patients ability to follow commands and initiate tasks asks. Pt appropriate to return to SNF upon d/c once medically stable. Acute PT to cont to follow.    Follow Up Recommendations SNF    Equipment Recommendations  None recommended by PT    Recommendations for Other Services       Precautions / Restrictions Precautions Precautions: Fall Precaution Comments: dementia, grips hard with the R hand, can be aggressive Restrictions Weight Bearing Restrictions: No      Mobility  Bed Mobility Overal bed mobility: Needs Assistance Bed Mobility: Supine to Sit     Supine to sit: Mod assist;Max assist;+2 for physical assistance     General bed mobility comments: due to impaired cognition pt with no initiation and required max tactile cues and assist to complete task  Transfers Overall transfer level: Needs assistance Equipment used: 2 person hand held assist Transfers: Sit to/from Stand;Stand Pivot Transfers Sit to Stand: Max assist;+2 physical assistance Stand pivot transfers: Max assist;+2 physical assistance       General transfer comment: maxA to power up and provide weightshifting to allow for patient to advance feet to step x3 to chair, pt very rigid in exentsion and retropulsion/posterior lean  Ambulation/Gait             General Gait Details: pt non ambulatory  Stairs            Wheelchair Mobility     Modified Rankin (Stroke Patients Only)       Balance Overall balance assessment: Needs assistance Sitting-balance support: Feet supported;No upper extremity supported Sitting balance-Leahy Scale: Poor Sitting balance - Comments: dpeendent to maintain EOB sitting balance Postural control: Posterior lean;Left lateral lean Standing balance support: Bilateral upper extremity supported Standing balance-Leahy Scale: Poor Standing balance comment: dependent on physical assist                             Pertinent Vitals/Pain Pain Assessment: No/denies pain    Home Living Family/patient expects to be discharged to:: Skilled nursing facility                 Additional Comments: per wife, Elita Quick, pt resides at [facility].     Prior Function Level of Independence: Needs assistance   Gait / Transfers Assistance Needed: spouse reports he stays in a w/c all day, non-ambulatory  ADL's / Homemaking Assistance Needed: RN staff assist with dressing/bathin, can feed self once set up        Hand Dominance   Dominant Hand: Right    Extremity/Trunk Assessment   Upper Extremity Assessment Upper Extremity Assessment: Defer to OT evaluation(pt holds L UE in flexed position close to flank)    Lower Extremity Assessment Lower Extremity Assessment: (pt very rigid, actively moves them minimal but no full ROM)    Cervical / Trunk Assessment Cervical / Trunk Assessment: Kyphotic  Communication   Communication: Other (comment)(soft spoken)  Cognition Arousal/Alertness: Awake/alert Behavior During  Therapy: Restless Overall Cognitive Status: History of cognitive impairments - at baseline                                 General Comments: pt with dementia, not typically oriented. Pt with approx 50% simple command follow      General Comments General comments (skin integrity, edema, etc.): pt with L mitten on, pt with NG tube otherwise VSS    Exercises      Assessment/Plan    PT Assessment Patient needs continued PT services  PT Problem List Decreased strength;Decreased range of motion;Decreased activity tolerance;Decreased mobility;Decreased balance;Decreased coordination;Decreased cognition;Decreased knowledge of use of DME;Decreased safety awareness       PT Treatment Interventions DME instruction;Gait training;Functional mobility training;Therapeutic activities;Therapeutic exercise;Balance training;Neuromuscular re-education;Cognitive remediation    PT Goals (Current goals can be found in the Care Plan section)  Acute Rehab PT Goals Patient Stated Goal: didn't state PT Goal Formulation: With patient/family Time For Goal Achievement: 04/08/18 Potential to Achieve Goals: Good    Frequency Min 2X/week   Barriers to discharge        Co-evaluation               AM-PAC PT "6 Clicks" Mobility  Outcome Measure Help needed turning from your back to your side while in a flat bed without using bedrails?: A Lot Help needed moving from lying on your back to sitting on the side of a flat bed without using bedrails?: A Lot Help needed moving to and from a bed to a chair (including a wheelchair)?: A Lot Help needed standing up from a chair using your arms (e.g., wheelchair or bedside chair)?: A Lot Help needed to walk in hospital room?: Total Help needed climbing 3-5 steps with a railing? : Total 6 Click Score: 10    End of Session Equipment Utilized During Treatment: Gait belt;Oxygen Activity Tolerance: Patient tolerated treatment well Patient left: in chair;with call bell/phone within reach;with chair alarm set;with family/visitor present Nurse Communication: Mobility status PT Visit Diagnosis: Unsteadiness on feet (R26.81);Difficulty in walking, not elsewhere classified (R26.2)    Time: 0626-9485 PT Time Calculation (min) (ACUTE ONLY): 27 min   Charges:   PT Evaluation $PT Eval Moderate Complexity: 1 Mod PT  Treatments $Therapeutic Activity: 8-22 mins        Lewis Shock, PT, DPT Acute Rehabilitation Services Pager #: [phone] Office #: [phone]   Iona Hansen 03/25/2018, 10:51 AM

## 2018-03-25 NOTE — Evaluation (Signed)
Occupational Therapy Evaluation Patient Details Name: Lee [name] MRN: [id number] DOB: Aug 11, 1950 Today's Date: 03/25/2018    History of Present Illness 21 yom dementia and seizures admitted 3/14 with seizure and altered mental status. PMH: epilepsy, prior strokes. Resides in SNF - [facility].   Clinical Impression   Pt admitted with above. He demonstrates the below listed deficits and will benefit from continued OT to maximize safety and independence with BADLs.  Pt presents to OT with h/o dementia.  He demonstrates Lt gaze preference, although will look to Rt with prompting.  He demonstrates focused attention with brief periods of sustained attention for ~30 seconds.  He appears to have ideomotor apraxia as well as motor impersistence.  Per chart, and RN, pt resides at [facility] and required assist with all ADLs except self feeding and with this was mod I.  He currently requires max A to feed self.   Will follow acutely to maximize his ability to engage in familiar ADL activities.  Recommend return to SNF at discharge.        Follow Up Recommendations  SNF    Equipment Recommendations  None recommended by OT    Recommendations for Other Services       Precautions / Restrictions Precautions Precautions: Fall Precaution Comments: dementia, grips hard with the R hand, can be aggressive Restrictions Weight Bearing Restrictions: No      Mobility Bed Mobility                  Transfers                      Balance                                           ADL either performed or assessed with clinical judgement   ADL Overall ADL's : Needs assistance/impaired Eating/Feeding: Maximal assistance;Bed level Eating/Feeding Details (indicate cue type and reason): Pt requires hand over hand assist to initiate task, he can at times scoop the food, and other times requires hand over hand assist.  He will attempt to move it to his mouth, but  becomes frozen mid movement requiring hand over hand assist to complete the task  He was able to take one bite without physical assistance  Grooming: Wash/dry hands;Wash/dry face;Oral care;Brushing hair;Total assistance;Bed level   Upper Body Bathing: Total assistance;Bed level   Lower Body Bathing: Total assistance;Bed level   Upper Body Dressing : Total assistance;Bed level   Lower Body Dressing: Total assistance;Bed level   Toilet Transfer: Total assistance Toilet Transfer Details (indicate cue type and reason): did not attmept  Toileting- Clothing Manipulation and Hygiene: Total assistance;Bed level       Functional mobility during ADLs: Total assistance       Vision   Additional Comments: Pt with look Lt and Rt, but appears to have Lt gaze preference      Perception Perception Perception Tested?: Yes Perception Deficits: Inattention/neglect Inattention/Neglect: Does not attend to right visual field Comments: demonstrates possible Rt inattention    Praxis Praxis Praxis tested?: Deficits Deficits: Initiation;Ideomotor;Motor Impersistence;Limb apraxia    Pertinent Vitals/Pain Pain Assessment: Faces Faces Pain Scale: No hurt     Hand Dominance Right   Extremity/Trunk Assessment Upper Extremity Assessment Upper Extremity Assessment: RUE deficits/detail;LUE deficits/detail RUE Deficits / Details: Pt with difficulty manipulating utensil with Rt hand,  but once it's in his hand, he is able to hold it.  Demonstrates questionable ideomotor apraxia when self feeding.  He frequently gets stuck mid movement requiring hand over hand assist to complete the task  RUE Coordination: decreased fine motor;decreased gross motor LUE Deficits / Details: Pt with restraint mitten on Lt hand.  Pt gripping it hard and therapist unable to remove it from his hand  LUE Coordination: decreased fine motor;decreased gross motor       Cervical / Trunk Assessment Cervical / Trunk Assessment:  Kyphotic   Communication Communication Communication: Expressive difficulties(Pt noted to be verbally perseverative )   Cognition Arousal/Alertness: Awake/alert Behavior During Therapy: WFL for tasks assessed/performed Overall Cognitive Status: No family/caregiver present to determine baseline cognitive functioning                                 General Comments: No family present to determine baseline.  H/o dementia Focused to brief periods of sustained attention.     General Comments       Exercises     Shoulder Instructions      Home Living Family/patient expects to be discharged to:: Skilled nursing facility                                 Additional Comments: Per chart review, [facility]        Prior Functioning/Environment Level of Independence: Needs assistance  Gait / Transfers Assistance Needed: spouse reports he stays in a w/c all day, non-ambulatory ADL's / Homemaking Assistance Needed: RN staff assist with dressing/bathin, can feed self once set up            OT Problem List: Decreased strength;Decreased activity tolerance;Impaired balance (sitting and/or standing);Decreased cognition;Decreased coordination;Decreased knowledge of use of DME or AE;Impaired UE functional use      OT Treatment/Interventions: Self-care/ADL training;DME and/or AE instruction;Therapeutic activities;Cognitive remediation/compensation;Patient/family education;Balance training    OT Goals(Current goals can be found in the care plan section) Acute Rehab OT Goals OT Goal Formulation: Patient unable to participate in goal setting Time For Goal Achievement: 04/08/18 Potential to Achieve Goals: Good ADL Goals Pt Will Perform Eating: with min assist;sitting  OT Frequency: Min 2X/week   Barriers to D/C: Decreased caregiver support          Co-evaluation              AM-PAC OT "6 Clicks" Daily Activity     Outcome  Measure Help from another person eating meals?: A Lot Help from another person taking care of personal grooming?: Total Help from another person toileting, which includes using toliet, bedpan, or urinal?: Total Help from another person bathing (including washing, rinsing, drying)?: Total Help from another person to put on and taking off regular upper body clothing?: Total Help from another person to put on and taking off regular lower body clothing?: Total 6 Click Score: 7   End of Session Nurse Communication: Mobility status  Activity Tolerance: Patient tolerated treatment well Patient left: in bed;with call bell/phone within reach;with bed alarm set;with nursing/sitter in room  OT Visit Diagnosis: Unsteadiness on feet (R26.81);Apraxia (R48.2);Feeding difficulties (R63.3);Cognitive communication deficit (R41.841)                Time: [id number] OT Time Calculation (min): 33 min Charges:  OT General Charges $OT Visit:  1 Visit OT Evaluation $OT Eval Moderate Complexity: 1 Mod OT Treatments $Self Care/Home Management : 8-22 mins  Jeani Hawking, OTR/L Acute Rehabilitation Services Pager [phone] Office [phone]   Jeani Hawking M 03/25/2018, 2:38 PM

## 2018-03-25 NOTE — NC FL2 (Signed)
[place] LEVEL OF CARE SCREENING TOOL     IDENTIFICATION  Patient Name: Lee [name] Birthdate: 1950/04/10 Sex: male Admission Date (Current Location): 03/22/2018  [facility] and [place] Number:  [name] and Address:  The [place], [place] [address], [place], [place] [address]      Provider Number: [id number]  Attending Physician Name and Address:  Juanito Doom, MD  Relative Name and Phone Number:       Current Level of Care: Hospital Recommended Level of Care: [place] Prior Approval Number:    Date Approved/Denied:   PASRR Number: [id number]  Discharge Plan: SNF    Current Diagnoses: Patient Active Problem List   Diagnosis Date Noted  . Seizure ([place]) 08/29/2016  . Leukocytosis 08/29/2016  . History of CVA (cerebrovascular accident) 08/29/2016  . Somnolence 08/29/2016  . Post-ictal state ([place]) 08/29/2016  . Lip laceration   . Pain   . Leg weakness, bilateral   . Leg pain, bilateral 08/17/2016  . UTI (urinary tract infection) 08/15/2016  . Aggression 07/26/2016  . Dementia with behavioral disturbance ([place]) 07/26/2016  . Elevated troponin 12/30/2015  . Chest pain 12/30/2015  . Pneumonia, likely aspiration 11/15/2013  . Status epilepticus ([place]) 11/14/2013  . Essential hypertension 11/14/2013  . Cerebral thrombosis with cerebral infarction ([place]) 11/07/2013  . Seizures ([place]) 11/06/2013  . Chronic airway obstruction, not elsewhere classified 09/22/2013  . Cerebral infarction due to unspecified mechanism 09/22/2013  . Hyperlipidemia 09/18/2013  . Encephalopathy acute 09/11/2013  . Altered mental status 09/11/2013  . CVA (cerebral infarction) 09/11/2013  . Hyponatremia 09/11/2013  . Acute encephalopathy 09/11/2013  . Aftercare following surgery of the circulatory system, [place] 08/25/2012  . Foot swelling 02/04/2012  . Drainage from wound 01/04/2012  . Peripheral vascular disease, unspecified  ([place]) 11/05/2011  . Pain in limb 11/05/2011  . Chronic total occlusion of artery of the extremities ([place]) 11/05/2011  . PAD (peripheral artery disease) ([place]) 10/28/2011  . TOBACCO ABUSE 02/06/2007  . SYMPTOM, EDEMA 06/13/2006  . ERECTILE DYSFUNCTION 01/25/2006    Orientation RESPIRATION BLADDER Height & Weight     Self, Place  Normal Incontinent, External catheter Weight: 63.1 kg Height:  '5\' 8"'$  (172.7 cm)  BEHAVIORAL SYMPTOMS/MOOD NEUROLOGICAL BOWEL NUTRITION STATUS  Other (Comment)(Hx of dementia; irritable at times) Convulsions/Seizures Incontinent (full supervision)  AMBULATORY STATUS COMMUNICATION OF NEEDS Skin   Total Care(Wheelchair/bed bound) Verbally Normal                       Personal Care Assistance Level of Assistance  Bathing, Feeding, Dressing, Total care Bathing Assistance: Maximum assistance Feeding assistance: Maximum assistance Dressing Assistance: Maximum assistance Total Care Assistance: Maximum assistance   Functional Limitations Info  Speech(weak speech)     Speech Info: Adequate    SPECIAL CARE FACTORS FREQUENCY  Speech therapy, PT (By licensed PT), OT (By licensed OT)     PT Frequency: 5-6 times weekly OT Frequency: 5-6 times weekly     Speech Therapy Frequency: 5-6 times weekly      Contractures Contractures Info: Not present    Additional Factors Info  Code Status, Allergies(Full code) Code Status Info: Full Code Allergies Info: Orange juice, orange oil, Penicillins, Vicodin (hydrocodone-acetaminophen), Oxycodone           Current Medications (03/25/2018):  This is the current hospital active medication list Current Facility-Administered Medications  Medication Dose Route Frequency Provider Last Rate Last Dose  .  acetaminophen (TYLENOL) tablet 650 mg  650 mg Oral Q4H PRN Juanito Doom, MD   650 mg at 03/23/18 0850  . albuterol (PROVENTIL) (2.5 MG/3ML) 0.083% nebulizer solution 3 mL  3 mL Inhalation Q6H PRN Simonne Maffucci  B, MD      . carBAMazepine (TEGRETOL) 100 MG/5ML suspension 400 mg  400 mg Oral BID Simonne Maffucci B, MD   400 mg at 03/25/18 0933  . chlorhexidine gluconate (MEDLINE KIT) (PERIDEX) 0.12 % solution 15 mL  15 mL Mouth Rinse BID Simonne Maffucci B, MD   15 mL at 03/25/18 0842  . famotidine (PEPCID) 40 MG/5ML suspension 20 mg  20 mg Per Tube BID Simonne Maffucci B, MD      . feeding supplement (VITAL 1.5 CAL) liquid 1,000 mL  1,000 mL Per Tube Continuous Juanito Doom, MD 50 mL/hr at 03/25/18 0700    . heparin injection 5,000 Units  5,000 Units Subcutaneous Q8H Juanito Doom, MD   5,000 Units at 03/25/18 1405  . lacosamide (VIMPAT) 200 mg in sodium chloride 0.9 % 25 mL IVPB  200 mg Intravenous Q12H Juanito Doom, MD   Stopped at 03/25/18 [phone]  . levETIRAcetam (KEPPRA) IVPB 1500 mg/ 100 mL premix  1,500 mg Intravenous Q12H Juanito Doom, MD   Stopped at 03/25/18 [phone]  . MEDLINE mouth rinse  15 mL Mouth Rinse BID Simonne Maffucci B, MD   15 mL at 03/25/18 0933  . ondansetron (ZOFRAN) injection 4 mg  4 mg Intravenous Q6H PRN Simonne Maffucci B, MD      . valproate (DEPACON) 500 mg in dextrose 5 % 50 mL IVPB  500 mg Intravenous Q8H Juanito Doom, MD   Stopped at 03/25/18 4847     Discharge Medications: Please see discharge summary for a list of discharge medications.  Relevant Imaging Results:  Relevant Lab Results:   Additional Information Wife must be allowed to administer seizure meds to patient to prevent further seizures    Reinaldo Raddle, RN, BSN  Trauma/Neuro ICU Case Manager [phone]

## 2018-03-26 ENCOUNTER — Inpatient Hospital Stay (HOSPITAL_COMMUNITY): Payer: Medicare Other

## 2018-03-26 LAB — GLUCOSE, CAPILLARY
GLUCOSE-CAPILLARY: 88 mg/dL (ref 70–99)
Glucose-Capillary: 112 mg/dL — ABNORMAL HIGH (ref 70–99)
Glucose-Capillary: 101 mg/dL — ABNORMAL HIGH (ref 70–99)

## 2018-03-26 LAB — BASIC METABOLIC PANEL
Anion gap: 4 — ABNORMAL LOW (ref 5–15)
BUN: 7 mg/dL — AB (ref 8–23)
CO2: 31 mmol/L (ref 22–32)
Calcium: 8.4 mg/dL — ABNORMAL LOW (ref 8.9–10.3)
Chloride: 111 mmol/L (ref 98–111)
Creatinine, Ser: 0.7 mg/dL (ref 0.61–1.24)
GFR calc Af Amer: >60 mL/min (ref >60)
GFR calc non Af Amer: >60 mL/min (ref >60)
Glucose, Bld: 96 mg/dL (ref 70–99)
Potassium: 3.1 mmol/L — ABNORMAL LOW (ref 3.5–5.1)
Sodium: 146 mmol/L — ABNORMAL HIGH (ref 135–145)

## 2018-03-26 MED ORDER — POTASSIUM CHLORIDE CRYS ER 20 MEQ PO TBCR: 40.0000 meq | EXTENDED_RELEASE_TABLET | Freq: Two times a day (BID) | ORAL | Status: DC

## 2018-03-26 MED ORDER — LEVETIRACETAM ER 500 MG PO TB24: 1500.0000 mg | ORAL_TABLET | Freq: Two times a day (BID) | ORAL | Status: DC

## 2018-03-26 MED ORDER — LEVETIRACETAM 750 MG PO TABS: 1500.0000 mg | ORAL_TABLET | Freq: Two times a day (BID) | ORAL | Status: DC

## 2018-03-26 MED ORDER — DIVALPROEX SODIUM 250 MG PO DR TAB
375.0000 mg | DELAYED_RELEASE_TABLET | Freq: Three times a day (TID) | ORAL | Status: DC
  Administered 2018-03-26: 375 mg via ORAL
  Filled 2018-03-26 (×2): qty 1

## 2018-03-26 MED ORDER — LACOSAMIDE 50 MG PO TABS: 300.0000 mg | ORAL_TABLET | Freq: Two times a day (BID) | ORAL | Status: DC

## 2018-03-26 MED ORDER — POTASSIUM CHLORIDE CRYS ER 20 MEQ PO TBCR
40.0000 meq | EXTENDED_RELEASE_TABLET | Freq: Every day | ORAL | Status: DC
  Administered 2018-03-26: 40 meq via ORAL
  Filled 2018-03-26: qty 2

## 2018-03-26 MED ORDER — CARBAMAZEPINE 200 MG PO TABS
400.0000 mg | ORAL_TABLET | Freq: Two times a day (BID) | ORAL | Status: DC
  Administered 2018-03-26: 400 mg via ORAL
  Filled 2018-03-26 (×2): qty 2

## 2018-03-26 NOTE — TOC Progression Note (Signed)
Transition of Care [facility]) - Progression Note    Patient Details  Name: Lee [name] MRN: [id number] Date of Birth: Apr 07, 1950  Transition of Care [facility]) CM/SW Contact  Baldemar Lenis, [place] Phone Number: 03/26/2018, 12:13 PM  Clinical Narrative:   CSW received call from [facility] that the Director of Nursing can make no promises about making any special accommodations for the patient to ensure that he gets his seizure medicine. Per Director of Nursing Pella, they will encourage the patient as best they can but make no promises to ensure that he takes his medications, and they are not allowing any special accommodations to allow the wife to assist with ensuring that he is compliant with his medications. CSW updated MD.     Expected Discharge Plan: Skilled Nursing Facility Barriers to Discharge: Continued Medical Work up, Other (comment)(wife not allowed to assist with medication compliance for patient)  Expected Discharge Plan and Services Expected Discharge Plan: Skilled Nursing Facility   Post Acute Care Choice: Skilled Nursing Facility   Expected Discharge Date: 03/26/18                         Social Determinants of Health (SDOH) Interventions    Readmission Risk Interventions 30 Day Unplanned Readmission Risk Score     ED to Hosp-Admission (Current) from 03/22/2018 in [facility]  30 Day Unplanned Readmission Risk Score (%)  21 Filed at 03/26/2018 1200     This score is the patient's risk of an unplanned readmission within 30 days of being discharged (0 -100%). The score is based on dignosis, age, lab data, medications, orders, and past utilization.   Low:  0-14.9   Medium: 15-21.9   High: 22-29.9   Extreme: 30 and above       No flowsheet data found.

## 2018-03-26 NOTE — Progress Notes (Signed)
Patient discharging to [facility] All belonging sent with patient  Nurse called report.

## 2018-03-26 NOTE — Progress Notes (Signed)
Modified Barium Swallow Progress Note  Patient Details  Name: Lee [name] MRN: [id number] Date of Birth: 10/28/1950  Today's Date: 03/26/2018  Modified Barium Swallow completed.  Full report located under Chart Review in the Imaging Section.  Brief recommendations include the following:  Clinical Impression  Pt presents with mild oropharyngeal dysphagia characterized by bolus holding and reduced lingual retraction which resulted in vallecular residue. Pt's study was limited to consecutive swallows of thin liquids and a single bolus of puree since pt refused to swallow additional boluses of solids and liquids despite encouragement from this SLP and the x-ray tech. Pt demonstrated a bolus hold and oral suctioning was required for removal of the boluses which the pt refused to swallow. No instances of penetration or aspiration were noted with any solids or liquids. It is recommended that the pt's diet be upgraded to dysphagia 1 (puree) solids and thin liquids via cup/straw. SLP will follow to ensure tolerance of the upgraded diet.    Swallow Evaluation Recommendations       SLP Diet Recommendations: Dysphagia 1 (Puree) solids;Thin liquid   Liquid Administration via: Cup;Straw   Medication Administration: Crushed with puree   Supervision: Full supervision/cueing for compensatory strategies;Full assist for feeding   Compensations: Slow rate;Small sips/bites;Follow solids with liquid   Postural Changes: Remain semi-upright after after feeds/meals (Comment);Seated upright at 90 degrees   Oral Care Recommendations: Oral care BID       Gwenyth Dingee I. Vear Clock, MS, CCC-SLP Acute Rehabilitation Services Office number [phone] Pager [phone]  Scheryl Marten 03/26/2018,3:04 PM

## 2018-03-26 NOTE — TOC Transition Note (Signed)
Transition of Care [facility]) - CM/SW Discharge Note   Patient Details  Name: Lee [name] MRN: [id number] Date of Birth: Nov 16, 1950  Transition [facility] Care [facility]) CM/SW Contact:  Baldemar Lenis, LCSW Phone Number: 03/26/2018, 1:26 PM   Clinical Narrative:  Nurse to call report to [phone], Room 806B.     Final next level of care: Long Term Nursing Home Barriers to Discharge: Barriers Unresolved (comment)(facility unable to promise that they will allow the wife to assist with medications; they will try to do what they can)   Patient Goals and CMS Choice Patient states their goals for this hospitalization and ongoing recovery are:: patient unable to participate in goal setting at this time CMS Medicare.gov Compare Post Acute Care list provided to:: Patient Represenative (must comment) Choice offered to / list presented to : Spouse  Discharge Placement   Existing PASRR number confirmed : 03/25/18          Patient [facility] bed at: [facility] Patient to be transferred to facility by: [facility] Name of family member notified: Wife Patient and family notified of of transfer: 03/26/18  Discharge Plan and Services   Post Acute Care Choice: Skilled Nursing Facility                    Social Determinants of Health (SDOH) Interventions     Readmission Risk Interventions No flowsheet data found.

## 2018-03-26 NOTE — Discharge Summary (Signed)
Physician Discharge Summary  Patient ID: Lee [name] MRN: [id number] DOB/AGE: 69-Apr-1952 69 y.o.  Admit date: 03/22/2018 Discharge date: 03/26/2018  Problem List Active Problems:   Status epilepticus [facility])  HPI: 68 year old male patient with known history of dementia and epilepsy, prior strokes.  Resides at skilled nursing facility.  Apparently had not been taking his anticonvulsants.  EMS was summoned on 3/14 when patient was found altered. In route to the ER he had a witnessed GTC seizure for which he was treated w/ versed. In ER mental status was still depressed & therefore he was intubated for ineffective airway maintenance   Hospital Course:  Past Medical History  Dementia, prior CVA, history of epilepsy, hypertension Significant Hospital Events  3/14: Admitted for possible seizure intubated for airway protection placed on propofol. Re-challenged with AEDs 3/15: EEG was negative for seizure. No seizures overnight. Beginning to taper propofol. Starting Rocephin for possible UTI Consults:  neuro  Procedures:  OETT 3/14>>> 3/16  Significant Diagnostic Tests:  CT 3/14: No acute finding by CT. Atrophy and chronic small-vessel ischemic changes as outlined above. EEG 3/14: Negative for seizure  Micro Data:  spurum nl flora UC 3/14>>>neg BC 3/14>>>ngtd  Antimicrobials:   Rocephin 3/15>>3/17  Assessment & Plan:  Seizures Plan: Continue tegretol, vimpat, depakote per neurology  Acute metabolic encephalopathy: improved Dementia baseline: requires assistance with all ADL's, incontinent baseline Minimize sedating medicines PT/OT consult He has reached maximal hospital benefit as of 03/26/2018 and plan is to discharge him back to a skilled nursing facility.  Of note this facility will not allow his wife to give him his medications for seizures which led to him being admitted for seizures to [facility].  When he is being discharged back to [facility] place at  this time they still not allow his wife to give him his medications.  Hopefully this can be corrected and he will not return to [facility] with recurrent seizures.  Acute respiratory failure with hypoxemia due to inability to protect airway: resolved Resolved extubated without problem  Leukocytosis: aerococcus urinae? Completed antimicrobial therapy with Rocephin  COPD by history Currently stable   Labs at discharge Lab Results  Component Value Date   CREATININE 0.70 03/26/2018   BUN 7 (L) 03/26/2018   NA 146 (H) 03/26/2018   K 3.1 (L) 03/26/2018   CL 111 03/26/2018   CO2 31 03/26/2018   Lab Results  Component Value Date   WBC 11.9 (H) 03/24/2018   HGB 12.7 (L) 03/24/2018   HCT 39.1 03/24/2018   MCV 97.5 03/24/2018   PLT 263 03/24/2018   Lab Results  Component Value Date   ALT 27 03/24/2018   AST 34 03/24/2018   ALKPHOS 74 03/24/2018   BILITOT 0.6 03/24/2018   Lab Results  Component Value Date   INR 1.18 08/15/2016   INR 1.04 06/13/2016   INR 1.17 12/30/2015    Current radiology studies No results found.  Disposition:  Discharge disposition: 03-Skilled Nursing Facility        Allergies as of 03/26/2018      Reactions   Orange Juice [orange Oil] Diarrhea   Penicillins Nausea And Vomiting   Tolerates Zosyn. "might have been an overdose" (01/22/2012) Has patient had a PCN reaction causing immediate rash, facial/tongue/throat swelling, SOB or lightheadedness with hypotension: yes Has patient had a PCN reaction causing severe rash involving mucus membranes or skin necrosis: unknown Has patient had a PCN reaction that required hospitalization: no Has patient had a PCN  reaction occurring within the last 10 years: no If all of the above answers are "NO", then may proceed with Cephalosporin u   Vicodin [hydrocodone-acetaminophen] Other (See Comments)   "triggered seizure both here and at home when he tried to take it" (01/22/2012)   Oxycodone    Causes  Seizures      Medication List    STOP taking these medications   lisinopril 20 MG tablet Commonly known as:  PRINIVIL,ZESTRIL   memantine 10 MG tablet Commonly known as:  NAMENDA   polyethylene glycol packet Commonly known as:  MIRALAX / GLYCOLAX   QUEtiapine 50 MG tablet Commonly known as:  SEROQUEL     TAKE these medications   acetaminophen 500 MG tablet Commonly known as:  TYLENOL Take 1,000 mg by mouth every 8 (eight) hours.   carbamazepine 200 MG tablet Commonly known as:  Epitol Take 2 tablets (400 mg total) by mouth 2 (two) times daily.   cholecalciferol 1000 units tablet Commonly known as:  VITAMIN D Take 1,000 Units by mouth daily.   clopidogrel 75 MG tablet Commonly known as:  PLAVIX Take 1 tablet (75 mg total) by mouth daily.   cyclobenzaprine 5 MG tablet Commonly known as:  FLEXERIL Take 5 mg by mouth every 12 (twelve) hours. For muscle spasms. Hold if sleepy.   Decubi-Vite Caps Take 1 capsule by mouth daily. To promote wound healing.   DEXTRAN 70-HYPROMELLOSE OP Place 2 drops into the left eye 2 (two) times daily. Continue to flush left eye as tolerated.   divalproex 125 MG DR tablet Commonly known as:  Depakote Take 3 tablets (375 mg total) by mouth 3 (three) times daily.   docusate sodium 100 MG capsule Commonly known as:  COLACE Take 1 capsule (100 mg total) by mouth 2 (two) times daily. What changed:    when to take this  reasons to take this   levETIRAcetam 750 MG tablet Commonly known as:  KEPPRA Take 2 tablets (1,500 mg total) by mouth 2 (two) times daily.   loratadine 10 MG tablet Commonly known as:  CLARITIN Take 10 mg by mouth daily.   NON FORMULARY Apply 1 mL topically 2 (two) times daily. ABH gel (Ativan 1mg Benito Mccreedy 12.5mg /Haldol 1mg ). Apply 1mL topically to forearm twice daily for mood disorder.   ondansetron 4 MG tablet Commonly known as:  ZOFRAN Take 1 tablet (4 mg total) by mouth every 6 (six) hours as needed for  nausea.   senna 8.6 MG Tabs tablet Commonly known as:  SENOKOT Take 2 tablets by mouth 2 (two) times daily.   Vimpat 150 MG Tabs Generic drug:  Lacosamide Take 300 mg by mouth 2 (two) times daily.         Discharged Condition: fair Note the skilled nursing facility has refused to allow his wife to administer his medications due to the lockdown secondary to coronavirus.  If allowed to take his medications he may return with recurrent seizures.  This concern was brought to the attention of case management, [facility] hospitalist service, pulmonary critical care service.  Time spent on discharge 44 minutes.  Vital signs at Discharge. Temp:  [98 F (36.7 C)-99.6 F (37.6 C)] 99.2 F (37.3 C) (03/18 1210) Pulse Rate:  [63-86] 79 (03/18 1210) Resp:  [17-20] 18 (03/18 1210) BP: (129-148)/(61-78) 129/67 (03/18 1210) SpO2:  [93 %-100 %] 98 % (03/18 1210) Office follow up Special Information or instructions. Signed: Brett Canales Elveta Rape ACNP Adolph Pollack PCCM Pager [phone] till 1 pm If  no answer page [phone] 03/26/2018, 12:12 PM

## 2018-03-26 NOTE — Progress Notes (Signed)
Speech Language Pathology Treatment: Dysphagia  Patient Details Name: Lee [name] MRN: [id number] DOB: 14-Jun-1950 Today's Date: 03/26/2018 Time: 4098-1191 SLP Time Calculation (min) (ACUTE ONLY): 13 min  Assessment / Plan / Recommendation Clinical Impression  Pt was seen for dysphagia treatment to ensure tolerance of the recommended diet and assess his ability to tolerate more advanced consistencies. Pt tolerated puree solids without overt s/sx of aspiration but exhibited throat clearing and coughing with thin liquids, suggesting possible aspiration. Pt refused all nectar thick liquids despite encouragement. His level of participation and cooperation was notably improved compared to that which was noted yesterday. It is recommended that a modified barium swallow study be conducted to further assess the functional integrity of the swallow mechanism and it is currently scheduled for 1230.    HPI HPI: Pt is a 68 year old male patient with known history of dementia and epilepsy, prior strokes who resides at skilled nursing facility and had not been taking his anticonvulsants.  EMS was summoned on 03/22/18 secondary to AMS. In route to the ER he had a witnessed GTC seizure for which he was treated with versed. In ER mental status was still depressed and he was therefore intubated for ineffective airway maintenance with extubation on 03/24/18. EEG of 03/23/18 was negative for seizure. CT of the head was negative.       SLP Plan  MBS       Recommendations  Diet recommendations: Dysphagia 1 (puree);Nectar-thick liquid Liquids provided via: Cup;Straw Medication Administration: Crushed with puree Supervision: Patient able to self feed Compensations: Slow rate;Small sips/bites;Follow solids with liquid Postural Changes and/or Swallow Maneuvers: Seated upright 90 degrees                Oral Care Recommendations: Oral care BID Follow up Recommendations: Skilled Nursing facility SLP Visit  Diagnosis: Dysphagia, oropharyngeal phase (R13.12) Plan: MBS       Koen Antilla I. Vear Clock, MS, CCC-SLP Acute Rehabilitation Services Office number [phone] Pager [phone]                Scheryl Marten 03/26/2018, 9:45 AM

## 2018-03-27 DIAGNOSIS — R2689 Other abnormalities of gait and mobility: Secondary | ICD-10-CM | POA: Diagnosis not present

## 2018-03-27 DIAGNOSIS — H1089 Other conjunctivitis: Secondary | ICD-10-CM | POA: Diagnosis not present

## 2018-03-27 DIAGNOSIS — R1312 Dysphagia, oropharyngeal phase: Secondary | ICD-10-CM | POA: Diagnosis not present

## 2018-03-27 DIAGNOSIS — J984 Other disorders of lung: Secondary | ICD-10-CM | POA: Diagnosis not present

## 2018-03-27 DIAGNOSIS — I10 Essential (primary) hypertension: Secondary | ICD-10-CM | POA: Diagnosis not present

## 2018-03-27 DIAGNOSIS — R2681 Unsteadiness on feet: Secondary | ICD-10-CM | POA: Diagnosis not present

## 2018-03-27 DIAGNOSIS — R278 Other lack of coordination: Secondary | ICD-10-CM | POA: Diagnosis not present

## 2018-03-27 DIAGNOSIS — Z9119 Patient's noncompliance with other medical treatment and regimen: Secondary | ICD-10-CM | POA: Diagnosis not present

## 2018-03-27 DIAGNOSIS — R296 Repeated falls: Secondary | ICD-10-CM | POA: Diagnosis not present

## 2018-03-27 DIAGNOSIS — M79662 Pain in left lower leg: Secondary | ICD-10-CM | POA: Diagnosis not present

## 2018-03-27 DIAGNOSIS — Z9181 History of falling: Secondary | ICD-10-CM | POA: Diagnosis not present

## 2018-03-27 DIAGNOSIS — M79661 Pain in right lower leg: Secondary | ICD-10-CM | POA: Diagnosis not present

## 2018-03-27 DIAGNOSIS — R1319 Other dysphagia: Secondary | ICD-10-CM | POA: Diagnosis not present

## 2018-03-27 LAB — CULTURE, BLOOD (ROUTINE X 2)
Culture: NO GROWTH
Special Requests: ADEQUATE

## 2018-03-28 DIAGNOSIS — R2689 Other abnormalities of gait and mobility: Secondary | ICD-10-CM | POA: Diagnosis not present

## 2018-03-28 DIAGNOSIS — R296 Repeated falls: Secondary | ICD-10-CM | POA: Diagnosis not present

## 2018-03-28 DIAGNOSIS — R1319 Other dysphagia: Secondary | ICD-10-CM | POA: Diagnosis not present

## 2018-03-28 DIAGNOSIS — R278 Other lack of coordination: Secondary | ICD-10-CM | POA: Diagnosis not present

## 2018-03-28 DIAGNOSIS — R1312 Dysphagia, oropharyngeal phase: Secondary | ICD-10-CM | POA: Diagnosis not present

## 2018-03-28 DIAGNOSIS — R2681 Unsteadiness on feet: Secondary | ICD-10-CM | POA: Diagnosis not present

## 2018-03-28 DIAGNOSIS — M79661 Pain in right lower leg: Secondary | ICD-10-CM | POA: Diagnosis not present

## 2018-03-28 DIAGNOSIS — M79662 Pain in left lower leg: Secondary | ICD-10-CM | POA: Diagnosis not present

## 2018-03-28 DIAGNOSIS — Z9181 History of falling: Secondary | ICD-10-CM | POA: Diagnosis not present

## 2018-03-31 DIAGNOSIS — R2689 Other abnormalities of gait and mobility: Secondary | ICD-10-CM | POA: Diagnosis not present

## 2018-03-31 DIAGNOSIS — M79662 Pain in left lower leg: Secondary | ICD-10-CM | POA: Diagnosis not present

## 2018-03-31 DIAGNOSIS — Z9119 Patient's noncompliance with other medical treatment and regimen: Secondary | ICD-10-CM | POA: Diagnosis not present

## 2018-03-31 DIAGNOSIS — R1312 Dysphagia, oropharyngeal phase: Secondary | ICD-10-CM | POA: Diagnosis not present

## 2018-03-31 DIAGNOSIS — R1319 Other dysphagia: Secondary | ICD-10-CM | POA: Diagnosis not present

## 2018-03-31 DIAGNOSIS — I10 Essential (primary) hypertension: Secondary | ICD-10-CM | POA: Diagnosis not present

## 2018-03-31 DIAGNOSIS — R2681 Unsteadiness on feet: Secondary | ICD-10-CM | POA: Diagnosis not present

## 2018-03-31 DIAGNOSIS — Z9181 History of falling: Secondary | ICD-10-CM | POA: Diagnosis not present

## 2018-03-31 DIAGNOSIS — R278 Other lack of coordination: Secondary | ICD-10-CM | POA: Diagnosis not present

## 2018-03-31 DIAGNOSIS — M79661 Pain in right lower leg: Secondary | ICD-10-CM | POA: Diagnosis not present

## 2018-03-31 DIAGNOSIS — R296 Repeated falls: Secondary | ICD-10-CM | POA: Diagnosis not present

## 2018-04-01 DIAGNOSIS — M79661 Pain in right lower leg: Secondary | ICD-10-CM | POA: Diagnosis not present

## 2018-04-01 DIAGNOSIS — Z9181 History of falling: Secondary | ICD-10-CM | POA: Diagnosis not present

## 2018-04-01 DIAGNOSIS — R1319 Other dysphagia: Secondary | ICD-10-CM | POA: Diagnosis not present

## 2018-04-01 DIAGNOSIS — R296 Repeated falls: Secondary | ICD-10-CM | POA: Diagnosis not present

## 2018-04-01 DIAGNOSIS — R278 Other lack of coordination: Secondary | ICD-10-CM | POA: Diagnosis not present

## 2018-04-01 DIAGNOSIS — R2681 Unsteadiness on feet: Secondary | ICD-10-CM | POA: Diagnosis not present

## 2018-04-01 DIAGNOSIS — R2689 Other abnormalities of gait and mobility: Secondary | ICD-10-CM | POA: Diagnosis not present

## 2018-04-01 DIAGNOSIS — M79662 Pain in left lower leg: Secondary | ICD-10-CM | POA: Diagnosis not present

## 2018-04-01 DIAGNOSIS — R1312 Dysphagia, oropharyngeal phase: Secondary | ICD-10-CM | POA: Diagnosis not present

## 2018-04-02 DIAGNOSIS — R296 Repeated falls: Secondary | ICD-10-CM | POA: Diagnosis not present

## 2018-04-02 DIAGNOSIS — R2689 Other abnormalities of gait and mobility: Secondary | ICD-10-CM | POA: Diagnosis not present

## 2018-04-02 DIAGNOSIS — R2681 Unsteadiness on feet: Secondary | ICD-10-CM | POA: Diagnosis not present

## 2018-04-02 DIAGNOSIS — R278 Other lack of coordination: Secondary | ICD-10-CM | POA: Diagnosis not present

## 2018-04-02 DIAGNOSIS — M79662 Pain in left lower leg: Secondary | ICD-10-CM | POA: Diagnosis not present

## 2018-04-02 DIAGNOSIS — Z9181 History of falling: Secondary | ICD-10-CM | POA: Diagnosis not present

## 2018-04-02 DIAGNOSIS — R1319 Other dysphagia: Secondary | ICD-10-CM | POA: Diagnosis not present

## 2018-04-02 DIAGNOSIS — R1312 Dysphagia, oropharyngeal phase: Secondary | ICD-10-CM | POA: Diagnosis not present

## 2018-04-02 DIAGNOSIS — M79661 Pain in right lower leg: Secondary | ICD-10-CM | POA: Diagnosis not present

## 2018-04-03 DIAGNOSIS — Z9181 History of falling: Secondary | ICD-10-CM | POA: Diagnosis not present

## 2018-04-03 DIAGNOSIS — R278 Other lack of coordination: Secondary | ICD-10-CM | POA: Diagnosis not present

## 2018-04-03 DIAGNOSIS — M79661 Pain in right lower leg: Secondary | ICD-10-CM | POA: Diagnosis not present

## 2018-04-03 DIAGNOSIS — R2681 Unsteadiness on feet: Secondary | ICD-10-CM | POA: Diagnosis not present

## 2018-04-03 DIAGNOSIS — R2689 Other abnormalities of gait and mobility: Secondary | ICD-10-CM | POA: Diagnosis not present

## 2018-04-03 DIAGNOSIS — M79662 Pain in left lower leg: Secondary | ICD-10-CM | POA: Diagnosis not present

## 2018-04-03 DIAGNOSIS — R296 Repeated falls: Secondary | ICD-10-CM | POA: Diagnosis not present

## 2018-04-03 DIAGNOSIS — R1312 Dysphagia, oropharyngeal phase: Secondary | ICD-10-CM | POA: Diagnosis not present

## 2018-04-03 DIAGNOSIS — R1319 Other dysphagia: Secondary | ICD-10-CM | POA: Diagnosis not present

## 2018-04-04 DIAGNOSIS — M79661 Pain in right lower leg: Secondary | ICD-10-CM | POA: Diagnosis not present

## 2018-04-04 DIAGNOSIS — Z9181 History of falling: Secondary | ICD-10-CM | POA: Diagnosis not present

## 2018-04-04 DIAGNOSIS — R296 Repeated falls: Secondary | ICD-10-CM | POA: Diagnosis not present

## 2018-04-04 DIAGNOSIS — R278 Other lack of coordination: Secondary | ICD-10-CM | POA: Diagnosis not present

## 2018-04-04 DIAGNOSIS — M79662 Pain in left lower leg: Secondary | ICD-10-CM | POA: Diagnosis not present

## 2018-04-04 DIAGNOSIS — R1319 Other dysphagia: Secondary | ICD-10-CM | POA: Diagnosis not present

## 2018-04-04 DIAGNOSIS — R1312 Dysphagia, oropharyngeal phase: Secondary | ICD-10-CM | POA: Diagnosis not present

## 2018-04-04 DIAGNOSIS — R2689 Other abnormalities of gait and mobility: Secondary | ICD-10-CM | POA: Diagnosis not present

## 2018-04-04 DIAGNOSIS — R2681 Unsteadiness on feet: Secondary | ICD-10-CM | POA: Diagnosis not present

## 2018-04-05 DIAGNOSIS — R278 Other lack of coordination: Secondary | ICD-10-CM | POA: Diagnosis not present

## 2018-04-05 DIAGNOSIS — R2689 Other abnormalities of gait and mobility: Secondary | ICD-10-CM | POA: Diagnosis not present

## 2018-04-05 DIAGNOSIS — Z9181 History of falling: Secondary | ICD-10-CM | POA: Diagnosis not present

## 2018-04-05 DIAGNOSIS — R1312 Dysphagia, oropharyngeal phase: Secondary | ICD-10-CM | POA: Diagnosis not present

## 2018-04-05 DIAGNOSIS — M79661 Pain in right lower leg: Secondary | ICD-10-CM | POA: Diagnosis not present

## 2018-04-05 DIAGNOSIS — R1319 Other dysphagia: Secondary | ICD-10-CM | POA: Diagnosis not present

## 2018-04-05 DIAGNOSIS — R296 Repeated falls: Secondary | ICD-10-CM | POA: Diagnosis not present

## 2018-04-05 DIAGNOSIS — R2681 Unsteadiness on feet: Secondary | ICD-10-CM | POA: Diagnosis not present

## 2018-04-05 DIAGNOSIS — M79662 Pain in left lower leg: Secondary | ICD-10-CM | POA: Diagnosis not present

## 2018-04-07 DIAGNOSIS — R2681 Unsteadiness on feet: Secondary | ICD-10-CM | POA: Diagnosis not present

## 2018-04-07 DIAGNOSIS — R2689 Other abnormalities of gait and mobility: Secondary | ICD-10-CM | POA: Diagnosis not present

## 2018-04-07 DIAGNOSIS — Z9181 History of falling: Secondary | ICD-10-CM | POA: Diagnosis not present

## 2018-04-07 DIAGNOSIS — M79661 Pain in right lower leg: Secondary | ICD-10-CM | POA: Diagnosis not present

## 2018-04-07 DIAGNOSIS — I69959 Hemiplegia and hemiparesis following unspecified cerebrovascular disease affecting unspecified side: Secondary | ICD-10-CM | POA: Diagnosis not present

## 2018-04-07 DIAGNOSIS — R569 Unspecified convulsions: Secondary | ICD-10-CM | POA: Diagnosis not present

## 2018-04-07 DIAGNOSIS — M79662 Pain in left lower leg: Secondary | ICD-10-CM | POA: Diagnosis not present

## 2018-04-07 DIAGNOSIS — R278 Other lack of coordination: Secondary | ICD-10-CM | POA: Diagnosis not present

## 2018-04-07 DIAGNOSIS — R1319 Other dysphagia: Secondary | ICD-10-CM | POA: Diagnosis not present

## 2018-04-07 DIAGNOSIS — R1312 Dysphagia, oropharyngeal phase: Secondary | ICD-10-CM | POA: Diagnosis not present

## 2018-04-07 DIAGNOSIS — R296 Repeated falls: Secondary | ICD-10-CM | POA: Diagnosis not present

## 2018-04-08 ENCOUNTER — Other Ambulatory Visit: Payer: Self-pay

## 2018-04-08 DIAGNOSIS — R1319 Other dysphagia: Secondary | ICD-10-CM | POA: Diagnosis not present

## 2018-04-08 DIAGNOSIS — R2681 Unsteadiness on feet: Secondary | ICD-10-CM | POA: Diagnosis not present

## 2018-04-08 DIAGNOSIS — M79662 Pain in left lower leg: Secondary | ICD-10-CM | POA: Diagnosis not present

## 2018-04-08 DIAGNOSIS — Z9181 History of falling: Secondary | ICD-10-CM | POA: Diagnosis not present

## 2018-04-08 DIAGNOSIS — R2689 Other abnormalities of gait and mobility: Secondary | ICD-10-CM | POA: Diagnosis not present

## 2018-04-08 DIAGNOSIS — R1312 Dysphagia, oropharyngeal phase: Secondary | ICD-10-CM | POA: Diagnosis not present

## 2018-04-08 DIAGNOSIS — M79661 Pain in right lower leg: Secondary | ICD-10-CM | POA: Diagnosis not present

## 2018-04-08 DIAGNOSIS — R278 Other lack of coordination: Secondary | ICD-10-CM | POA: Diagnosis not present

## 2018-04-08 DIAGNOSIS — R296 Repeated falls: Secondary | ICD-10-CM | POA: Diagnosis not present

## 2018-04-08 NOTE — Patient Outreach (Signed)
[facility]) Care Management  04/08/2018  LUISFELIPE YATSKO 12-19-1950 [id number]   Medication Adherence call to Mr. Stockton Pullian patient did not answer patient is due on Lisinopril 20 mg  Mr. Boby is showing past due under [facility] Ins.  Lillia Abed CPhT Pharmacy Technician [facility] Management Direct Dial [phone]  Fax [phone] [email or web address]

## 2018-04-09 DIAGNOSIS — R2681 Unsteadiness on feet: Secondary | ICD-10-CM | POA: Diagnosis not present

## 2018-04-09 DIAGNOSIS — R1319 Other dysphagia: Secondary | ICD-10-CM | POA: Diagnosis not present

## 2018-04-09 DIAGNOSIS — R1312 Dysphagia, oropharyngeal phase: Secondary | ICD-10-CM | POA: Diagnosis not present

## 2018-04-09 DIAGNOSIS — R278 Other lack of coordination: Secondary | ICD-10-CM | POA: Diagnosis not present

## 2018-04-09 DIAGNOSIS — M79661 Pain in right lower leg: Secondary | ICD-10-CM | POA: Diagnosis not present

## 2018-04-09 DIAGNOSIS — M79662 Pain in left lower leg: Secondary | ICD-10-CM | POA: Diagnosis not present

## 2018-04-10 DIAGNOSIS — R1319 Other dysphagia: Secondary | ICD-10-CM | POA: Diagnosis not present

## 2018-04-10 DIAGNOSIS — R2681 Unsteadiness on feet: Secondary | ICD-10-CM | POA: Diagnosis not present

## 2018-04-10 DIAGNOSIS — R1312 Dysphagia, oropharyngeal phase: Secondary | ICD-10-CM | POA: Diagnosis not present

## 2018-04-10 DIAGNOSIS — M79662 Pain in left lower leg: Secondary | ICD-10-CM | POA: Diagnosis not present

## 2018-04-10 DIAGNOSIS — R278 Other lack of coordination: Secondary | ICD-10-CM | POA: Diagnosis not present

## 2018-04-10 DIAGNOSIS — M79661 Pain in right lower leg: Secondary | ICD-10-CM | POA: Diagnosis not present

## 2018-04-11 DIAGNOSIS — R1319 Other dysphagia: Secondary | ICD-10-CM | POA: Diagnosis not present

## 2018-04-11 DIAGNOSIS — M79661 Pain in right lower leg: Secondary | ICD-10-CM | POA: Diagnosis not present

## 2018-04-11 DIAGNOSIS — R278 Other lack of coordination: Secondary | ICD-10-CM | POA: Diagnosis not present

## 2018-04-11 DIAGNOSIS — R1312 Dysphagia, oropharyngeal phase: Secondary | ICD-10-CM | POA: Diagnosis not present

## 2018-04-11 DIAGNOSIS — M79662 Pain in left lower leg: Secondary | ICD-10-CM | POA: Diagnosis not present

## 2018-04-11 DIAGNOSIS — R2681 Unsteadiness on feet: Secondary | ICD-10-CM | POA: Diagnosis not present

## 2018-04-13 DIAGNOSIS — R1312 Dysphagia, oropharyngeal phase: Secondary | ICD-10-CM | POA: Diagnosis not present

## 2018-04-13 DIAGNOSIS — R2681 Unsteadiness on feet: Secondary | ICD-10-CM | POA: Diagnosis not present

## 2018-04-13 DIAGNOSIS — M79661 Pain in right lower leg: Secondary | ICD-10-CM | POA: Diagnosis not present

## 2018-04-13 DIAGNOSIS — R278 Other lack of coordination: Secondary | ICD-10-CM | POA: Diagnosis not present

## 2018-04-13 DIAGNOSIS — M79662 Pain in left lower leg: Secondary | ICD-10-CM | POA: Diagnosis not present

## 2018-04-13 DIAGNOSIS — R1319 Other dysphagia: Secondary | ICD-10-CM | POA: Diagnosis not present

## 2018-04-14 DIAGNOSIS — M79661 Pain in right lower leg: Secondary | ICD-10-CM | POA: Diagnosis not present

## 2018-04-14 DIAGNOSIS — M79662 Pain in left lower leg: Secondary | ICD-10-CM | POA: Diagnosis not present

## 2018-04-14 DIAGNOSIS — R278 Other lack of coordination: Secondary | ICD-10-CM | POA: Diagnosis not present

## 2018-04-14 DIAGNOSIS — R2681 Unsteadiness on feet: Secondary | ICD-10-CM | POA: Diagnosis not present

## 2018-04-14 DIAGNOSIS — R1312 Dysphagia, oropharyngeal phase: Secondary | ICD-10-CM | POA: Diagnosis not present

## 2018-04-14 DIAGNOSIS — R1319 Other dysphagia: Secondary | ICD-10-CM | POA: Diagnosis not present

## 2018-04-15 DIAGNOSIS — I10 Essential (primary) hypertension: Secondary | ICD-10-CM | POA: Diagnosis not present

## 2018-04-15 DIAGNOSIS — R1312 Dysphagia, oropharyngeal phase: Secondary | ICD-10-CM | POA: Diagnosis not present

## 2018-04-15 DIAGNOSIS — Z9119 Patient's noncompliance with other medical treatment and regimen: Secondary | ICD-10-CM | POA: Diagnosis not present

## 2018-04-15 DIAGNOSIS — R1319 Other dysphagia: Secondary | ICD-10-CM | POA: Diagnosis not present

## 2018-04-15 DIAGNOSIS — M79662 Pain in left lower leg: Secondary | ICD-10-CM | POA: Diagnosis not present

## 2018-04-15 DIAGNOSIS — M79661 Pain in right lower leg: Secondary | ICD-10-CM | POA: Diagnosis not present

## 2018-04-15 DIAGNOSIS — R278 Other lack of coordination: Secondary | ICD-10-CM | POA: Diagnosis not present

## 2018-04-15 DIAGNOSIS — R2681 Unsteadiness on feet: Secondary | ICD-10-CM | POA: Diagnosis not present

## 2018-04-16 DIAGNOSIS — R278 Other lack of coordination: Secondary | ICD-10-CM | POA: Diagnosis not present

## 2018-04-16 DIAGNOSIS — M79661 Pain in right lower leg: Secondary | ICD-10-CM | POA: Diagnosis not present

## 2018-04-16 DIAGNOSIS — R2681 Unsteadiness on feet: Secondary | ICD-10-CM | POA: Diagnosis not present

## 2018-04-16 DIAGNOSIS — R1319 Other dysphagia: Secondary | ICD-10-CM | POA: Diagnosis not present

## 2018-04-16 DIAGNOSIS — R1312 Dysphagia, oropharyngeal phase: Secondary | ICD-10-CM | POA: Diagnosis not present

## 2018-04-16 DIAGNOSIS — M79662 Pain in left lower leg: Secondary | ICD-10-CM | POA: Diagnosis not present

## 2018-04-17 DIAGNOSIS — R1312 Dysphagia, oropharyngeal phase: Secondary | ICD-10-CM | POA: Diagnosis not present

## 2018-04-17 DIAGNOSIS — R1319 Other dysphagia: Secondary | ICD-10-CM | POA: Diagnosis not present

## 2018-04-17 DIAGNOSIS — M79662 Pain in left lower leg: Secondary | ICD-10-CM | POA: Diagnosis not present

## 2018-04-17 DIAGNOSIS — I10 Essential (primary) hypertension: Secondary | ICD-10-CM | POA: Diagnosis not present

## 2018-04-17 DIAGNOSIS — R2681 Unsteadiness on feet: Secondary | ICD-10-CM | POA: Diagnosis not present

## 2018-04-17 DIAGNOSIS — R278 Other lack of coordination: Secondary | ICD-10-CM | POA: Diagnosis not present

## 2018-04-17 DIAGNOSIS — M79661 Pain in right lower leg: Secondary | ICD-10-CM | POA: Diagnosis not present

## 2018-04-17 DIAGNOSIS — Z9119 Patient's noncompliance with other medical treatment and regimen: Secondary | ICD-10-CM | POA: Diagnosis not present

## 2018-04-18 DIAGNOSIS — M79662 Pain in left lower leg: Secondary | ICD-10-CM | POA: Diagnosis not present

## 2018-04-18 DIAGNOSIS — R1319 Other dysphagia: Secondary | ICD-10-CM | POA: Diagnosis not present

## 2018-04-18 DIAGNOSIS — R2681 Unsteadiness on feet: Secondary | ICD-10-CM | POA: Diagnosis not present

## 2018-04-18 DIAGNOSIS — M79661 Pain in right lower leg: Secondary | ICD-10-CM | POA: Diagnosis not present

## 2018-04-18 DIAGNOSIS — R278 Other lack of coordination: Secondary | ICD-10-CM | POA: Diagnosis not present

## 2018-04-18 DIAGNOSIS — R1312 Dysphagia, oropharyngeal phase: Secondary | ICD-10-CM | POA: Diagnosis not present

## 2018-04-19 DIAGNOSIS — M79661 Pain in right lower leg: Secondary | ICD-10-CM | POA: Diagnosis not present

## 2018-04-19 DIAGNOSIS — R278 Other lack of coordination: Secondary | ICD-10-CM | POA: Diagnosis not present

## 2018-04-19 DIAGNOSIS — R1312 Dysphagia, oropharyngeal phase: Secondary | ICD-10-CM | POA: Diagnosis not present

## 2018-04-19 DIAGNOSIS — M79662 Pain in left lower leg: Secondary | ICD-10-CM | POA: Diagnosis not present

## 2018-04-19 DIAGNOSIS — R2681 Unsteadiness on feet: Secondary | ICD-10-CM | POA: Diagnosis not present

## 2018-04-19 DIAGNOSIS — R1319 Other dysphagia: Secondary | ICD-10-CM | POA: Diagnosis not present

## 2018-04-20 DIAGNOSIS — M79661 Pain in right lower leg: Secondary | ICD-10-CM | POA: Diagnosis not present

## 2018-04-20 DIAGNOSIS — R278 Other lack of coordination: Secondary | ICD-10-CM | POA: Diagnosis not present

## 2018-04-20 DIAGNOSIS — R1312 Dysphagia, oropharyngeal phase: Secondary | ICD-10-CM | POA: Diagnosis not present

## 2018-04-20 DIAGNOSIS — R2681 Unsteadiness on feet: Secondary | ICD-10-CM | POA: Diagnosis not present

## 2018-04-20 DIAGNOSIS — R1319 Other dysphagia: Secondary | ICD-10-CM | POA: Diagnosis not present

## 2018-04-20 DIAGNOSIS — M79662 Pain in left lower leg: Secondary | ICD-10-CM | POA: Diagnosis not present

## 2018-04-21 DIAGNOSIS — I69959 Hemiplegia and hemiparesis following unspecified cerebrovascular disease affecting unspecified side: Secondary | ICD-10-CM | POA: Diagnosis not present

## 2018-04-21 DIAGNOSIS — M79662 Pain in left lower leg: Secondary | ICD-10-CM | POA: Diagnosis not present

## 2018-04-21 DIAGNOSIS — R1319 Other dysphagia: Secondary | ICD-10-CM | POA: Diagnosis not present

## 2018-04-21 DIAGNOSIS — R2681 Unsteadiness on feet: Secondary | ICD-10-CM | POA: Diagnosis not present

## 2018-04-21 DIAGNOSIS — R1312 Dysphagia, oropharyngeal phase: Secondary | ICD-10-CM | POA: Diagnosis not present

## 2018-04-21 DIAGNOSIS — R569 Unspecified convulsions: Secondary | ICD-10-CM | POA: Diagnosis not present

## 2018-04-21 DIAGNOSIS — M79661 Pain in right lower leg: Secondary | ICD-10-CM | POA: Diagnosis not present

## 2018-04-21 DIAGNOSIS — R278 Other lack of coordination: Secondary | ICD-10-CM | POA: Diagnosis not present

## 2018-04-22 ENCOUNTER — Other Ambulatory Visit: Payer: Self-pay

## 2018-04-22 DIAGNOSIS — M79661 Pain in right lower leg: Secondary | ICD-10-CM | POA: Diagnosis not present

## 2018-04-22 DIAGNOSIS — R1319 Other dysphagia: Secondary | ICD-10-CM | POA: Diagnosis not present

## 2018-04-22 DIAGNOSIS — M79662 Pain in left lower leg: Secondary | ICD-10-CM | POA: Diagnosis not present

## 2018-04-22 DIAGNOSIS — R278 Other lack of coordination: Secondary | ICD-10-CM | POA: Diagnosis not present

## 2018-04-22 DIAGNOSIS — R2681 Unsteadiness on feet: Secondary | ICD-10-CM | POA: Diagnosis not present

## 2018-04-22 DIAGNOSIS — R1312 Dysphagia, oropharyngeal phase: Secondary | ICD-10-CM | POA: Diagnosis not present

## 2018-04-22 NOTE — Patient Outreach (Signed)
[facility]) Care Management  04/22/2018  ARNAB SOLURI 1950/12/07 [id number]  Medication Adherence call to Mr. Hartwell Burkeen HIPPA Compliant Voice message left with a call back number. Mr. Matczak is showing past due on Lisinopril 20 mg under [employer] Ins.    Lillia Abed CPhT Pharmacy Technician [facility] Care Management Direct Dial [phone]  Fax [phone] [email or web address]

## 2018-04-23 DIAGNOSIS — R278 Other lack of coordination: Secondary | ICD-10-CM | POA: Diagnosis not present

## 2018-04-23 DIAGNOSIS — R1312 Dysphagia, oropharyngeal phase: Secondary | ICD-10-CM | POA: Diagnosis not present

## 2018-04-23 DIAGNOSIS — R1319 Other dysphagia: Secondary | ICD-10-CM | POA: Diagnosis not present

## 2018-04-23 DIAGNOSIS — M79662 Pain in left lower leg: Secondary | ICD-10-CM | POA: Diagnosis not present

## 2018-04-23 DIAGNOSIS — R2681 Unsteadiness on feet: Secondary | ICD-10-CM | POA: Diagnosis not present

## 2018-04-23 DIAGNOSIS — M79661 Pain in right lower leg: Secondary | ICD-10-CM | POA: Diagnosis not present

## 2018-04-24 DIAGNOSIS — R1319 Other dysphagia: Secondary | ICD-10-CM | POA: Diagnosis not present

## 2018-04-24 DIAGNOSIS — M79661 Pain in right lower leg: Secondary | ICD-10-CM | POA: Diagnosis not present

## 2018-04-24 DIAGNOSIS — R278 Other lack of coordination: Secondary | ICD-10-CM | POA: Diagnosis not present

## 2018-04-24 DIAGNOSIS — M79662 Pain in left lower leg: Secondary | ICD-10-CM | POA: Diagnosis not present

## 2018-04-24 DIAGNOSIS — R1312 Dysphagia, oropharyngeal phase: Secondary | ICD-10-CM | POA: Diagnosis not present

## 2018-04-24 DIAGNOSIS — R2681 Unsteadiness on feet: Secondary | ICD-10-CM | POA: Diagnosis not present

## 2018-04-25 DIAGNOSIS — R278 Other lack of coordination: Secondary | ICD-10-CM | POA: Diagnosis not present

## 2018-04-25 DIAGNOSIS — M79661 Pain in right lower leg: Secondary | ICD-10-CM | POA: Diagnosis not present

## 2018-04-25 DIAGNOSIS — R1319 Other dysphagia: Secondary | ICD-10-CM | POA: Diagnosis not present

## 2018-04-25 DIAGNOSIS — R1312 Dysphagia, oropharyngeal phase: Secondary | ICD-10-CM | POA: Diagnosis not present

## 2018-04-25 DIAGNOSIS — M79662 Pain in left lower leg: Secondary | ICD-10-CM | POA: Diagnosis not present

## 2018-04-25 DIAGNOSIS — R2681 Unsteadiness on feet: Secondary | ICD-10-CM | POA: Diagnosis not present

## 2018-04-26 DIAGNOSIS — R2681 Unsteadiness on feet: Secondary | ICD-10-CM | POA: Diagnosis not present

## 2018-04-26 DIAGNOSIS — M79661 Pain in right lower leg: Secondary | ICD-10-CM | POA: Diagnosis not present

## 2018-04-26 DIAGNOSIS — M79662 Pain in left lower leg: Secondary | ICD-10-CM | POA: Diagnosis not present

## 2018-04-26 DIAGNOSIS — R278 Other lack of coordination: Secondary | ICD-10-CM | POA: Diagnosis not present

## 2018-04-26 DIAGNOSIS — R1312 Dysphagia, oropharyngeal phase: Secondary | ICD-10-CM | POA: Diagnosis not present

## 2018-04-26 DIAGNOSIS — R1319 Other dysphagia: Secondary | ICD-10-CM | POA: Diagnosis not present

## 2018-04-27 DIAGNOSIS — R2681 Unsteadiness on feet: Secondary | ICD-10-CM | POA: Diagnosis not present

## 2018-04-27 DIAGNOSIS — M79661 Pain in right lower leg: Secondary | ICD-10-CM | POA: Diagnosis not present

## 2018-04-27 DIAGNOSIS — R278 Other lack of coordination: Secondary | ICD-10-CM | POA: Diagnosis not present

## 2018-04-27 DIAGNOSIS — M79662 Pain in left lower leg: Secondary | ICD-10-CM | POA: Diagnosis not present

## 2018-04-27 DIAGNOSIS — R1319 Other dysphagia: Secondary | ICD-10-CM | POA: Diagnosis not present

## 2018-04-27 DIAGNOSIS — R1312 Dysphagia, oropharyngeal phase: Secondary | ICD-10-CM | POA: Diagnosis not present

## 2018-04-28 DIAGNOSIS — M79662 Pain in left lower leg: Secondary | ICD-10-CM | POA: Diagnosis not present

## 2018-04-28 DIAGNOSIS — M79661 Pain in right lower leg: Secondary | ICD-10-CM | POA: Diagnosis not present

## 2018-04-28 DIAGNOSIS — R1319 Other dysphagia: Secondary | ICD-10-CM | POA: Diagnosis not present

## 2018-04-28 DIAGNOSIS — R1312 Dysphagia, oropharyngeal phase: Secondary | ICD-10-CM | POA: Diagnosis not present

## 2018-04-28 DIAGNOSIS — R2681 Unsteadiness on feet: Secondary | ICD-10-CM | POA: Diagnosis not present

## 2018-04-28 DIAGNOSIS — R278 Other lack of coordination: Secondary | ICD-10-CM | POA: Diagnosis not present

## 2018-04-29 DIAGNOSIS — J984 Other disorders of lung: Secondary | ICD-10-CM | POA: Diagnosis not present

## 2018-04-29 DIAGNOSIS — R2681 Unsteadiness on feet: Secondary | ICD-10-CM | POA: Diagnosis not present

## 2018-04-29 DIAGNOSIS — R1319 Other dysphagia: Secondary | ICD-10-CM | POA: Diagnosis not present

## 2018-04-29 DIAGNOSIS — R278 Other lack of coordination: Secondary | ICD-10-CM | POA: Diagnosis not present

## 2018-04-29 DIAGNOSIS — M79661 Pain in right lower leg: Secondary | ICD-10-CM | POA: Diagnosis not present

## 2018-04-29 DIAGNOSIS — M79662 Pain in left lower leg: Secondary | ICD-10-CM | POA: Diagnosis not present

## 2018-04-29 DIAGNOSIS — R1312 Dysphagia, oropharyngeal phase: Secondary | ICD-10-CM | POA: Diagnosis not present

## 2018-04-30 DIAGNOSIS — R1312 Dysphagia, oropharyngeal phase: Secondary | ICD-10-CM | POA: Diagnosis not present

## 2018-04-30 DIAGNOSIS — R2681 Unsteadiness on feet: Secondary | ICD-10-CM | POA: Diagnosis not present

## 2018-04-30 DIAGNOSIS — R1319 Other dysphagia: Secondary | ICD-10-CM | POA: Diagnosis not present

## 2018-04-30 DIAGNOSIS — M79661 Pain in right lower leg: Secondary | ICD-10-CM | POA: Diagnosis not present

## 2018-04-30 DIAGNOSIS — M79662 Pain in left lower leg: Secondary | ICD-10-CM | POA: Diagnosis not present

## 2018-04-30 DIAGNOSIS — R278 Other lack of coordination: Secondary | ICD-10-CM | POA: Diagnosis not present

## 2018-05-01 DIAGNOSIS — M79661 Pain in right lower leg: Secondary | ICD-10-CM | POA: Diagnosis not present

## 2018-05-01 DIAGNOSIS — M79662 Pain in left lower leg: Secondary | ICD-10-CM | POA: Diagnosis not present

## 2018-05-01 DIAGNOSIS — R1319 Other dysphagia: Secondary | ICD-10-CM | POA: Diagnosis not present

## 2018-05-01 DIAGNOSIS — R278 Other lack of coordination: Secondary | ICD-10-CM | POA: Diagnosis not present

## 2018-05-01 DIAGNOSIS — R2681 Unsteadiness on feet: Secondary | ICD-10-CM | POA: Diagnosis not present

## 2018-05-01 DIAGNOSIS — R1312 Dysphagia, oropharyngeal phase: Secondary | ICD-10-CM | POA: Diagnosis not present

## 2018-05-01 DIAGNOSIS — B349 Viral infection, unspecified: Secondary | ICD-10-CM | POA: Diagnosis not present

## 2018-05-02 DIAGNOSIS — R278 Other lack of coordination: Secondary | ICD-10-CM | POA: Diagnosis not present

## 2018-05-02 DIAGNOSIS — R1319 Other dysphagia: Secondary | ICD-10-CM | POA: Diagnosis not present

## 2018-05-02 DIAGNOSIS — R2681 Unsteadiness on feet: Secondary | ICD-10-CM | POA: Diagnosis not present

## 2018-05-02 DIAGNOSIS — M79661 Pain in right lower leg: Secondary | ICD-10-CM | POA: Diagnosis not present

## 2018-05-02 DIAGNOSIS — R1312 Dysphagia, oropharyngeal phase: Secondary | ICD-10-CM | POA: Diagnosis not present

## 2018-05-02 DIAGNOSIS — M79662 Pain in left lower leg: Secondary | ICD-10-CM | POA: Diagnosis not present

## 2018-05-04 DIAGNOSIS — M79661 Pain in right lower leg: Secondary | ICD-10-CM | POA: Diagnosis not present

## 2018-05-04 DIAGNOSIS — R278 Other lack of coordination: Secondary | ICD-10-CM | POA: Diagnosis not present

## 2018-05-04 DIAGNOSIS — R1319 Other dysphagia: Secondary | ICD-10-CM | POA: Diagnosis not present

## 2018-05-04 DIAGNOSIS — R2681 Unsteadiness on feet: Secondary | ICD-10-CM | POA: Diagnosis not present

## 2018-05-04 DIAGNOSIS — M79662 Pain in left lower leg: Secondary | ICD-10-CM | POA: Diagnosis not present

## 2018-05-04 DIAGNOSIS — R1312 Dysphagia, oropharyngeal phase: Secondary | ICD-10-CM | POA: Diagnosis not present

## 2018-05-05 DIAGNOSIS — R2681 Unsteadiness on feet: Secondary | ICD-10-CM | POA: Diagnosis not present

## 2018-05-05 DIAGNOSIS — R1312 Dysphagia, oropharyngeal phase: Secondary | ICD-10-CM | POA: Diagnosis not present

## 2018-05-05 DIAGNOSIS — R1319 Other dysphagia: Secondary | ICD-10-CM | POA: Diagnosis not present

## 2018-05-05 DIAGNOSIS — M79661 Pain in right lower leg: Secondary | ICD-10-CM | POA: Diagnosis not present

## 2018-05-05 DIAGNOSIS — R278 Other lack of coordination: Secondary | ICD-10-CM | POA: Diagnosis not present

## 2018-05-05 DIAGNOSIS — M79662 Pain in left lower leg: Secondary | ICD-10-CM | POA: Diagnosis not present

## 2018-05-06 DIAGNOSIS — R1312 Dysphagia, oropharyngeal phase: Secondary | ICD-10-CM | POA: Diagnosis not present

## 2018-05-06 DIAGNOSIS — R278 Other lack of coordination: Secondary | ICD-10-CM | POA: Diagnosis not present

## 2018-05-06 DIAGNOSIS — M79661 Pain in right lower leg: Secondary | ICD-10-CM | POA: Diagnosis not present

## 2018-05-06 DIAGNOSIS — M79662 Pain in left lower leg: Secondary | ICD-10-CM | POA: Diagnosis not present

## 2018-05-06 DIAGNOSIS — R2681 Unsteadiness on feet: Secondary | ICD-10-CM | POA: Diagnosis not present

## 2018-05-06 DIAGNOSIS — R1319 Other dysphagia: Secondary | ICD-10-CM | POA: Diagnosis not present

## 2018-05-08 DIAGNOSIS — R2681 Unsteadiness on feet: Secondary | ICD-10-CM | POA: Diagnosis not present

## 2018-05-08 DIAGNOSIS — R1312 Dysphagia, oropharyngeal phase: Secondary | ICD-10-CM | POA: Diagnosis not present

## 2018-05-08 DIAGNOSIS — R278 Other lack of coordination: Secondary | ICD-10-CM | POA: Diagnosis not present

## 2018-05-08 DIAGNOSIS — M79662 Pain in left lower leg: Secondary | ICD-10-CM | POA: Diagnosis not present

## 2018-05-08 DIAGNOSIS — R1319 Other dysphagia: Secondary | ICD-10-CM | POA: Diagnosis not present

## 2018-05-08 DIAGNOSIS — M79661 Pain in right lower leg: Secondary | ICD-10-CM | POA: Diagnosis not present

## 2018-05-09 DIAGNOSIS — M79661 Pain in right lower leg: Secondary | ICD-10-CM | POA: Diagnosis not present

## 2018-05-09 DIAGNOSIS — R278 Other lack of coordination: Secondary | ICD-10-CM | POA: Diagnosis not present

## 2018-05-09 DIAGNOSIS — R2681 Unsteadiness on feet: Secondary | ICD-10-CM | POA: Diagnosis not present

## 2018-05-09 DIAGNOSIS — R1312 Dysphagia, oropharyngeal phase: Secondary | ICD-10-CM | POA: Diagnosis not present

## 2018-05-09 DIAGNOSIS — M79662 Pain in left lower leg: Secondary | ICD-10-CM | POA: Diagnosis not present

## 2018-05-09 DIAGNOSIS — R1319 Other dysphagia: Secondary | ICD-10-CM | POA: Diagnosis not present

## 2018-05-12 DIAGNOSIS — M79662 Pain in left lower leg: Secondary | ICD-10-CM | POA: Diagnosis not present

## 2018-05-12 DIAGNOSIS — R1319 Other dysphagia: Secondary | ICD-10-CM | POA: Diagnosis not present

## 2018-05-12 DIAGNOSIS — R2681 Unsteadiness on feet: Secondary | ICD-10-CM | POA: Diagnosis not present

## 2018-05-12 DIAGNOSIS — R1312 Dysphagia, oropharyngeal phase: Secondary | ICD-10-CM | POA: Diagnosis not present

## 2018-05-12 DIAGNOSIS — R278 Other lack of coordination: Secondary | ICD-10-CM | POA: Diagnosis not present

## 2018-05-12 DIAGNOSIS — M79661 Pain in right lower leg: Secondary | ICD-10-CM | POA: Diagnosis not present

## 2018-05-13 DIAGNOSIS — R2681 Unsteadiness on feet: Secondary | ICD-10-CM | POA: Diagnosis not present

## 2018-05-13 DIAGNOSIS — M79662 Pain in left lower leg: Secondary | ICD-10-CM | POA: Diagnosis not present

## 2018-05-13 DIAGNOSIS — M79661 Pain in right lower leg: Secondary | ICD-10-CM | POA: Diagnosis not present

## 2018-05-13 DIAGNOSIS — R1312 Dysphagia, oropharyngeal phase: Secondary | ICD-10-CM | POA: Diagnosis not present

## 2018-05-13 DIAGNOSIS — R278 Other lack of coordination: Secondary | ICD-10-CM | POA: Diagnosis not present

## 2018-05-13 DIAGNOSIS — R1319 Other dysphagia: Secondary | ICD-10-CM | POA: Diagnosis not present

## 2018-05-14 DIAGNOSIS — R278 Other lack of coordination: Secondary | ICD-10-CM | POA: Diagnosis not present

## 2018-05-14 DIAGNOSIS — R1312 Dysphagia, oropharyngeal phase: Secondary | ICD-10-CM | POA: Diagnosis not present

## 2018-05-14 DIAGNOSIS — R2681 Unsteadiness on feet: Secondary | ICD-10-CM | POA: Diagnosis not present

## 2018-05-14 DIAGNOSIS — M79661 Pain in right lower leg: Secondary | ICD-10-CM | POA: Diagnosis not present

## 2018-05-14 DIAGNOSIS — M79662 Pain in left lower leg: Secondary | ICD-10-CM | POA: Diagnosis not present

## 2018-05-14 DIAGNOSIS — R1319 Other dysphagia: Secondary | ICD-10-CM | POA: Diagnosis not present

## 2018-05-15 DIAGNOSIS — R1312 Dysphagia, oropharyngeal phase: Secondary | ICD-10-CM | POA: Diagnosis not present

## 2018-05-15 DIAGNOSIS — M79662 Pain in left lower leg: Secondary | ICD-10-CM | POA: Diagnosis not present

## 2018-05-15 DIAGNOSIS — M79661 Pain in right lower leg: Secondary | ICD-10-CM | POA: Diagnosis not present

## 2018-05-15 DIAGNOSIS — R2681 Unsteadiness on feet: Secondary | ICD-10-CM | POA: Diagnosis not present

## 2018-05-15 DIAGNOSIS — R1319 Other dysphagia: Secondary | ICD-10-CM | POA: Diagnosis not present

## 2018-05-15 DIAGNOSIS — R278 Other lack of coordination: Secondary | ICD-10-CM | POA: Diagnosis not present

## 2018-05-16 DIAGNOSIS — R1312 Dysphagia, oropharyngeal phase: Secondary | ICD-10-CM | POA: Diagnosis not present

## 2018-05-16 DIAGNOSIS — K5901 Slow transit constipation: Secondary | ICD-10-CM | POA: Diagnosis not present

## 2018-05-16 DIAGNOSIS — R278 Other lack of coordination: Secondary | ICD-10-CM | POA: Diagnosis not present

## 2018-05-16 DIAGNOSIS — R2681 Unsteadiness on feet: Secondary | ICD-10-CM | POA: Diagnosis not present

## 2018-05-16 DIAGNOSIS — U071 COVID-19: Secondary | ICD-10-CM | POA: Diagnosis not present

## 2018-05-16 DIAGNOSIS — I10 Essential (primary) hypertension: Secondary | ICD-10-CM | POA: Diagnosis not present

## 2018-05-16 DIAGNOSIS — R1319 Other dysphagia: Secondary | ICD-10-CM | POA: Diagnosis not present

## 2018-05-16 DIAGNOSIS — M79662 Pain in left lower leg: Secondary | ICD-10-CM | POA: Diagnosis not present

## 2018-05-16 DIAGNOSIS — M79661 Pain in right lower leg: Secondary | ICD-10-CM | POA: Diagnosis not present

## 2018-05-18 DIAGNOSIS — R1319 Other dysphagia: Secondary | ICD-10-CM | POA: Diagnosis not present

## 2018-05-18 DIAGNOSIS — R278 Other lack of coordination: Secondary | ICD-10-CM | POA: Diagnosis not present

## 2018-05-18 DIAGNOSIS — R2681 Unsteadiness on feet: Secondary | ICD-10-CM | POA: Diagnosis not present

## 2018-05-18 DIAGNOSIS — R1312 Dysphagia, oropharyngeal phase: Secondary | ICD-10-CM | POA: Diagnosis not present

## 2018-05-18 DIAGNOSIS — M79662 Pain in left lower leg: Secondary | ICD-10-CM | POA: Diagnosis not present

## 2018-05-18 DIAGNOSIS — M79661 Pain in right lower leg: Secondary | ICD-10-CM | POA: Diagnosis not present

## 2018-05-19 DIAGNOSIS — M79662 Pain in left lower leg: Secondary | ICD-10-CM | POA: Diagnosis not present

## 2018-05-19 DIAGNOSIS — R569 Unspecified convulsions: Secondary | ICD-10-CM | POA: Diagnosis not present

## 2018-05-19 DIAGNOSIS — R1312 Dysphagia, oropharyngeal phase: Secondary | ICD-10-CM | POA: Diagnosis not present

## 2018-05-19 DIAGNOSIS — R278 Other lack of coordination: Secondary | ICD-10-CM | POA: Diagnosis not present

## 2018-05-19 DIAGNOSIS — I69959 Hemiplegia and hemiparesis following unspecified cerebrovascular disease affecting unspecified side: Secondary | ICD-10-CM | POA: Diagnosis not present

## 2018-05-19 DIAGNOSIS — M79661 Pain in right lower leg: Secondary | ICD-10-CM | POA: Diagnosis not present

## 2018-05-19 DIAGNOSIS — R1319 Other dysphagia: Secondary | ICD-10-CM | POA: Diagnosis not present

## 2018-05-19 DIAGNOSIS — R2681 Unsteadiness on feet: Secondary | ICD-10-CM | POA: Diagnosis not present

## 2018-05-20 DIAGNOSIS — R1312 Dysphagia, oropharyngeal phase: Secondary | ICD-10-CM | POA: Diagnosis not present

## 2018-05-20 DIAGNOSIS — R2681 Unsteadiness on feet: Secondary | ICD-10-CM | POA: Diagnosis not present

## 2018-05-20 DIAGNOSIS — M79661 Pain in right lower leg: Secondary | ICD-10-CM | POA: Diagnosis not present

## 2018-05-20 DIAGNOSIS — M79662 Pain in left lower leg: Secondary | ICD-10-CM | POA: Diagnosis not present

## 2018-05-20 DIAGNOSIS — R278 Other lack of coordination: Secondary | ICD-10-CM | POA: Diagnosis not present

## 2018-05-20 DIAGNOSIS — R1319 Other dysphagia: Secondary | ICD-10-CM | POA: Diagnosis not present

## 2018-05-21 DIAGNOSIS — M79662 Pain in left lower leg: Secondary | ICD-10-CM | POA: Diagnosis not present

## 2018-05-21 DIAGNOSIS — M79661 Pain in right lower leg: Secondary | ICD-10-CM | POA: Diagnosis not present

## 2018-05-21 DIAGNOSIS — R278 Other lack of coordination: Secondary | ICD-10-CM | POA: Diagnosis not present

## 2018-05-21 DIAGNOSIS — R1312 Dysphagia, oropharyngeal phase: Secondary | ICD-10-CM | POA: Diagnosis not present

## 2018-05-21 DIAGNOSIS — R2681 Unsteadiness on feet: Secondary | ICD-10-CM | POA: Diagnosis not present

## 2018-05-21 DIAGNOSIS — R1319 Other dysphagia: Secondary | ICD-10-CM | POA: Diagnosis not present

## 2018-05-22 DIAGNOSIS — M79662 Pain in left lower leg: Secondary | ICD-10-CM | POA: Diagnosis not present

## 2018-05-22 DIAGNOSIS — R2681 Unsteadiness on feet: Secondary | ICD-10-CM | POA: Diagnosis not present

## 2018-05-22 DIAGNOSIS — M79661 Pain in right lower leg: Secondary | ICD-10-CM | POA: Diagnosis not present

## 2018-05-22 DIAGNOSIS — R1319 Other dysphagia: Secondary | ICD-10-CM | POA: Diagnosis not present

## 2018-05-22 DIAGNOSIS — R1312 Dysphagia, oropharyngeal phase: Secondary | ICD-10-CM | POA: Diagnosis not present

## 2018-05-22 DIAGNOSIS — R278 Other lack of coordination: Secondary | ICD-10-CM | POA: Diagnosis not present

## 2018-05-23 DIAGNOSIS — R278 Other lack of coordination: Secondary | ICD-10-CM | POA: Diagnosis not present

## 2018-05-23 DIAGNOSIS — R1319 Other dysphagia: Secondary | ICD-10-CM | POA: Diagnosis not present

## 2018-05-23 DIAGNOSIS — M79661 Pain in right lower leg: Secondary | ICD-10-CM | POA: Diagnosis not present

## 2018-05-23 DIAGNOSIS — R1312 Dysphagia, oropharyngeal phase: Secondary | ICD-10-CM | POA: Diagnosis not present

## 2018-05-23 DIAGNOSIS — M79662 Pain in left lower leg: Secondary | ICD-10-CM | POA: Diagnosis not present

## 2018-05-23 DIAGNOSIS — R2681 Unsteadiness on feet: Secondary | ICD-10-CM | POA: Diagnosis not present

## 2018-05-24 DIAGNOSIS — R2681 Unsteadiness on feet: Secondary | ICD-10-CM | POA: Diagnosis not present

## 2018-05-24 DIAGNOSIS — M79662 Pain in left lower leg: Secondary | ICD-10-CM | POA: Diagnosis not present

## 2018-05-24 DIAGNOSIS — M79661 Pain in right lower leg: Secondary | ICD-10-CM | POA: Diagnosis not present

## 2018-05-24 DIAGNOSIS — R1319 Other dysphagia: Secondary | ICD-10-CM | POA: Diagnosis not present

## 2018-05-24 DIAGNOSIS — R1312 Dysphagia, oropharyngeal phase: Secondary | ICD-10-CM | POA: Diagnosis not present

## 2018-05-24 DIAGNOSIS — R278 Other lack of coordination: Secondary | ICD-10-CM | POA: Diagnosis not present

## 2018-05-25 DIAGNOSIS — R278 Other lack of coordination: Secondary | ICD-10-CM | POA: Diagnosis not present

## 2018-05-25 DIAGNOSIS — R1312 Dysphagia, oropharyngeal phase: Secondary | ICD-10-CM | POA: Diagnosis not present

## 2018-05-25 DIAGNOSIS — M79662 Pain in left lower leg: Secondary | ICD-10-CM | POA: Diagnosis not present

## 2018-05-25 DIAGNOSIS — M79661 Pain in right lower leg: Secondary | ICD-10-CM | POA: Diagnosis not present

## 2018-05-25 DIAGNOSIS — R1319 Other dysphagia: Secondary | ICD-10-CM | POA: Diagnosis not present

## 2018-05-25 DIAGNOSIS — R2681 Unsteadiness on feet: Secondary | ICD-10-CM | POA: Diagnosis not present

## 2018-05-26 DIAGNOSIS — M79662 Pain in left lower leg: Secondary | ICD-10-CM | POA: Diagnosis not present

## 2018-05-26 DIAGNOSIS — R278 Other lack of coordination: Secondary | ICD-10-CM | POA: Diagnosis not present

## 2018-05-26 DIAGNOSIS — R1312 Dysphagia, oropharyngeal phase: Secondary | ICD-10-CM | POA: Diagnosis not present

## 2018-05-26 DIAGNOSIS — R1319 Other dysphagia: Secondary | ICD-10-CM | POA: Diagnosis not present

## 2018-05-26 DIAGNOSIS — M79661 Pain in right lower leg: Secondary | ICD-10-CM | POA: Diagnosis not present

## 2018-05-26 DIAGNOSIS — R2681 Unsteadiness on feet: Secondary | ICD-10-CM | POA: Diagnosis not present

## 2018-05-27 DIAGNOSIS — M79661 Pain in right lower leg: Secondary | ICD-10-CM | POA: Diagnosis not present

## 2018-05-27 DIAGNOSIS — R1319 Other dysphagia: Secondary | ICD-10-CM | POA: Diagnosis not present

## 2018-05-27 DIAGNOSIS — R2681 Unsteadiness on feet: Secondary | ICD-10-CM | POA: Diagnosis not present

## 2018-05-27 DIAGNOSIS — R1312 Dysphagia, oropharyngeal phase: Secondary | ICD-10-CM | POA: Diagnosis not present

## 2018-05-27 DIAGNOSIS — M79662 Pain in left lower leg: Secondary | ICD-10-CM | POA: Diagnosis not present

## 2018-05-27 DIAGNOSIS — R278 Other lack of coordination: Secondary | ICD-10-CM | POA: Diagnosis not present

## 2018-05-28 DIAGNOSIS — R1319 Other dysphagia: Secondary | ICD-10-CM | POA: Diagnosis not present

## 2018-05-28 DIAGNOSIS — R278 Other lack of coordination: Secondary | ICD-10-CM | POA: Diagnosis not present

## 2018-05-28 DIAGNOSIS — R2681 Unsteadiness on feet: Secondary | ICD-10-CM | POA: Diagnosis not present

## 2018-05-28 DIAGNOSIS — R1312 Dysphagia, oropharyngeal phase: Secondary | ICD-10-CM | POA: Diagnosis not present

## 2018-05-28 DIAGNOSIS — M79661 Pain in right lower leg: Secondary | ICD-10-CM | POA: Diagnosis not present

## 2018-05-28 DIAGNOSIS — M79662 Pain in left lower leg: Secondary | ICD-10-CM | POA: Diagnosis not present

## 2018-05-29 DIAGNOSIS — M79662 Pain in left lower leg: Secondary | ICD-10-CM | POA: Diagnosis not present

## 2018-05-29 DIAGNOSIS — R1319 Other dysphagia: Secondary | ICD-10-CM | POA: Diagnosis not present

## 2018-05-29 DIAGNOSIS — R2681 Unsteadiness on feet: Secondary | ICD-10-CM | POA: Diagnosis not present

## 2018-05-29 DIAGNOSIS — M79661 Pain in right lower leg: Secondary | ICD-10-CM | POA: Diagnosis not present

## 2018-05-29 DIAGNOSIS — R1312 Dysphagia, oropharyngeal phase: Secondary | ICD-10-CM | POA: Diagnosis not present

## 2018-05-29 DIAGNOSIS — R278 Other lack of coordination: Secondary | ICD-10-CM | POA: Diagnosis not present

## 2018-05-30 DIAGNOSIS — M79662 Pain in left lower leg: Secondary | ICD-10-CM | POA: Diagnosis not present

## 2018-05-30 DIAGNOSIS — R2681 Unsteadiness on feet: Secondary | ICD-10-CM | POA: Diagnosis not present

## 2018-05-30 DIAGNOSIS — R278 Other lack of coordination: Secondary | ICD-10-CM | POA: Diagnosis not present

## 2018-05-30 DIAGNOSIS — R1312 Dysphagia, oropharyngeal phase: Secondary | ICD-10-CM | POA: Diagnosis not present

## 2018-05-30 DIAGNOSIS — R1319 Other dysphagia: Secondary | ICD-10-CM | POA: Diagnosis not present

## 2018-05-30 DIAGNOSIS — M79661 Pain in right lower leg: Secondary | ICD-10-CM | POA: Diagnosis not present

## 2018-05-31 DIAGNOSIS — M79661 Pain in right lower leg: Secondary | ICD-10-CM | POA: Diagnosis not present

## 2018-05-31 DIAGNOSIS — M79662 Pain in left lower leg: Secondary | ICD-10-CM | POA: Diagnosis not present

## 2018-05-31 DIAGNOSIS — R278 Other lack of coordination: Secondary | ICD-10-CM | POA: Diagnosis not present

## 2018-05-31 DIAGNOSIS — R1319 Other dysphagia: Secondary | ICD-10-CM | POA: Diagnosis not present

## 2018-05-31 DIAGNOSIS — R1312 Dysphagia, oropharyngeal phase: Secondary | ICD-10-CM | POA: Diagnosis not present

## 2018-05-31 DIAGNOSIS — R2681 Unsteadiness on feet: Secondary | ICD-10-CM | POA: Diagnosis not present

## 2018-06-01 DIAGNOSIS — R278 Other lack of coordination: Secondary | ICD-10-CM | POA: Diagnosis not present

## 2018-06-01 DIAGNOSIS — R2681 Unsteadiness on feet: Secondary | ICD-10-CM | POA: Diagnosis not present

## 2018-06-01 DIAGNOSIS — R1319 Other dysphagia: Secondary | ICD-10-CM | POA: Diagnosis not present

## 2018-06-01 DIAGNOSIS — R1312 Dysphagia, oropharyngeal phase: Secondary | ICD-10-CM | POA: Diagnosis not present

## 2018-06-01 DIAGNOSIS — M79662 Pain in left lower leg: Secondary | ICD-10-CM | POA: Diagnosis not present

## 2018-06-01 DIAGNOSIS — M79661 Pain in right lower leg: Secondary | ICD-10-CM | POA: Diagnosis not present

## 2018-06-02 DIAGNOSIS — R1319 Other dysphagia: Secondary | ICD-10-CM | POA: Diagnosis not present

## 2018-06-02 DIAGNOSIS — R278 Other lack of coordination: Secondary | ICD-10-CM | POA: Diagnosis not present

## 2018-06-02 DIAGNOSIS — R1312 Dysphagia, oropharyngeal phase: Secondary | ICD-10-CM | POA: Diagnosis not present

## 2018-06-02 DIAGNOSIS — R2681 Unsteadiness on feet: Secondary | ICD-10-CM | POA: Diagnosis not present

## 2018-06-02 DIAGNOSIS — M79662 Pain in left lower leg: Secondary | ICD-10-CM | POA: Diagnosis not present

## 2018-06-02 DIAGNOSIS — M79661 Pain in right lower leg: Secondary | ICD-10-CM | POA: Diagnosis not present

## 2018-06-03 DIAGNOSIS — M79661 Pain in right lower leg: Secondary | ICD-10-CM | POA: Diagnosis not present

## 2018-06-03 DIAGNOSIS — R2681 Unsteadiness on feet: Secondary | ICD-10-CM | POA: Diagnosis not present

## 2018-06-03 DIAGNOSIS — R278 Other lack of coordination: Secondary | ICD-10-CM | POA: Diagnosis not present

## 2018-06-03 DIAGNOSIS — M79662 Pain in left lower leg: Secondary | ICD-10-CM | POA: Diagnosis not present

## 2018-06-03 DIAGNOSIS — R1312 Dysphagia, oropharyngeal phase: Secondary | ICD-10-CM | POA: Diagnosis not present

## 2018-06-03 DIAGNOSIS — R1319 Other dysphagia: Secondary | ICD-10-CM | POA: Diagnosis not present

## 2018-06-04 DIAGNOSIS — R2681 Unsteadiness on feet: Secondary | ICD-10-CM | POA: Diagnosis not present

## 2018-06-04 DIAGNOSIS — R278 Other lack of coordination: Secondary | ICD-10-CM | POA: Diagnosis not present

## 2018-06-04 DIAGNOSIS — M79661 Pain in right lower leg: Secondary | ICD-10-CM | POA: Diagnosis not present

## 2018-06-04 DIAGNOSIS — R1319 Other dysphagia: Secondary | ICD-10-CM | POA: Diagnosis not present

## 2018-06-04 DIAGNOSIS — M79662 Pain in left lower leg: Secondary | ICD-10-CM | POA: Diagnosis not present

## 2018-06-04 DIAGNOSIS — R1312 Dysphagia, oropharyngeal phase: Secondary | ICD-10-CM | POA: Diagnosis not present

## 2018-06-05 DIAGNOSIS — M79661 Pain in right lower leg: Secondary | ICD-10-CM | POA: Diagnosis not present

## 2018-06-05 DIAGNOSIS — M79662 Pain in left lower leg: Secondary | ICD-10-CM | POA: Diagnosis not present

## 2018-06-05 DIAGNOSIS — R1319 Other dysphagia: Secondary | ICD-10-CM | POA: Diagnosis not present

## 2018-06-05 DIAGNOSIS — R2681 Unsteadiness on feet: Secondary | ICD-10-CM | POA: Diagnosis not present

## 2018-06-05 DIAGNOSIS — R278 Other lack of coordination: Secondary | ICD-10-CM | POA: Diagnosis not present

## 2018-06-05 DIAGNOSIS — R1312 Dysphagia, oropharyngeal phase: Secondary | ICD-10-CM | POA: Diagnosis not present

## 2018-06-06 DIAGNOSIS — R1319 Other dysphagia: Secondary | ICD-10-CM | POA: Diagnosis not present

## 2018-06-06 DIAGNOSIS — R1312 Dysphagia, oropharyngeal phase: Secondary | ICD-10-CM | POA: Diagnosis not present

## 2018-06-06 DIAGNOSIS — M79661 Pain in right lower leg: Secondary | ICD-10-CM | POA: Diagnosis not present

## 2018-06-06 DIAGNOSIS — M79662 Pain in left lower leg: Secondary | ICD-10-CM | POA: Diagnosis not present

## 2018-06-06 DIAGNOSIS — R278 Other lack of coordination: Secondary | ICD-10-CM | POA: Diagnosis not present

## 2018-06-06 DIAGNOSIS — R2681 Unsteadiness on feet: Secondary | ICD-10-CM | POA: Diagnosis not present

## 2018-06-07 DIAGNOSIS — R278 Other lack of coordination: Secondary | ICD-10-CM | POA: Diagnosis not present

## 2018-06-07 DIAGNOSIS — R1312 Dysphagia, oropharyngeal phase: Secondary | ICD-10-CM | POA: Diagnosis not present

## 2018-06-07 DIAGNOSIS — R2681 Unsteadiness on feet: Secondary | ICD-10-CM | POA: Diagnosis not present

## 2018-06-07 DIAGNOSIS — M79662 Pain in left lower leg: Secondary | ICD-10-CM | POA: Diagnosis not present

## 2018-06-07 DIAGNOSIS — M79661 Pain in right lower leg: Secondary | ICD-10-CM | POA: Diagnosis not present

## 2018-06-07 DIAGNOSIS — R1319 Other dysphagia: Secondary | ICD-10-CM | POA: Diagnosis not present

## 2018-06-08 DIAGNOSIS — R1312 Dysphagia, oropharyngeal phase: Secondary | ICD-10-CM | POA: Diagnosis not present

## 2018-06-08 DIAGNOSIS — M79662 Pain in left lower leg: Secondary | ICD-10-CM | POA: Diagnosis not present

## 2018-06-08 DIAGNOSIS — M79661 Pain in right lower leg: Secondary | ICD-10-CM | POA: Diagnosis not present

## 2018-06-08 DIAGNOSIS — R2681 Unsteadiness on feet: Secondary | ICD-10-CM | POA: Diagnosis not present

## 2018-06-08 DIAGNOSIS — R278 Other lack of coordination: Secondary | ICD-10-CM | POA: Diagnosis not present

## 2018-06-08 DIAGNOSIS — R1319 Other dysphagia: Secondary | ICD-10-CM | POA: Diagnosis not present

## 2018-06-09 DIAGNOSIS — U071 COVID-19: Secondary | ICD-10-CM | POA: Diagnosis not present

## 2018-06-09 DIAGNOSIS — R1319 Other dysphagia: Secondary | ICD-10-CM | POA: Diagnosis not present

## 2018-06-09 DIAGNOSIS — M79661 Pain in right lower leg: Secondary | ICD-10-CM | POA: Diagnosis not present

## 2018-06-09 DIAGNOSIS — M79662 Pain in left lower leg: Secondary | ICD-10-CM | POA: Diagnosis not present

## 2018-06-09 DIAGNOSIS — R278 Other lack of coordination: Secondary | ICD-10-CM | POA: Diagnosis not present

## 2018-06-09 DIAGNOSIS — R2681 Unsteadiness on feet: Secondary | ICD-10-CM | POA: Diagnosis not present

## 2018-06-09 DIAGNOSIS — R1312 Dysphagia, oropharyngeal phase: Secondary | ICD-10-CM | POA: Diagnosis not present

## 2018-06-09 DIAGNOSIS — J9690 Respiratory failure, unspecified, unspecified whether with hypoxia or hypercapnia: Secondary | ICD-10-CM | POA: Diagnosis not present

## 2018-06-10 DIAGNOSIS — R1319 Other dysphagia: Secondary | ICD-10-CM | POA: Diagnosis not present

## 2018-06-10 DIAGNOSIS — M79662 Pain in left lower leg: Secondary | ICD-10-CM | POA: Diagnosis not present

## 2018-06-10 DIAGNOSIS — R278 Other lack of coordination: Secondary | ICD-10-CM | POA: Diagnosis not present

## 2018-06-10 DIAGNOSIS — R2681 Unsteadiness on feet: Secondary | ICD-10-CM | POA: Diagnosis not present

## 2018-06-10 DIAGNOSIS — J9690 Respiratory failure, unspecified, unspecified whether with hypoxia or hypercapnia: Secondary | ICD-10-CM | POA: Diagnosis not present

## 2018-06-10 DIAGNOSIS — R1312 Dysphagia, oropharyngeal phase: Secondary | ICD-10-CM | POA: Diagnosis not present

## 2018-06-10 DIAGNOSIS — M79661 Pain in right lower leg: Secondary | ICD-10-CM | POA: Diagnosis not present

## 2018-06-10 DIAGNOSIS — U071 COVID-19: Secondary | ICD-10-CM | POA: Diagnosis not present

## 2018-06-11 DIAGNOSIS — M79662 Pain in left lower leg: Secondary | ICD-10-CM | POA: Diagnosis not present

## 2018-06-11 DIAGNOSIS — U071 COVID-19: Secondary | ICD-10-CM | POA: Diagnosis not present

## 2018-06-11 DIAGNOSIS — J9690 Respiratory failure, unspecified, unspecified whether with hypoxia or hypercapnia: Secondary | ICD-10-CM | POA: Diagnosis not present

## 2018-06-11 DIAGNOSIS — R1319 Other dysphagia: Secondary | ICD-10-CM | POA: Diagnosis not present

## 2018-06-11 DIAGNOSIS — R278 Other lack of coordination: Secondary | ICD-10-CM | POA: Diagnosis not present

## 2018-06-11 DIAGNOSIS — R1312 Dysphagia, oropharyngeal phase: Secondary | ICD-10-CM | POA: Diagnosis not present

## 2018-06-11 DIAGNOSIS — M79661 Pain in right lower leg: Secondary | ICD-10-CM | POA: Diagnosis not present

## 2018-06-11 DIAGNOSIS — R2681 Unsteadiness on feet: Secondary | ICD-10-CM | POA: Diagnosis not present

## 2018-06-12 DIAGNOSIS — R1312 Dysphagia, oropharyngeal phase: Secondary | ICD-10-CM | POA: Diagnosis not present

## 2018-06-12 DIAGNOSIS — J9690 Respiratory failure, unspecified, unspecified whether with hypoxia or hypercapnia: Secondary | ICD-10-CM | POA: Diagnosis not present

## 2018-06-12 DIAGNOSIS — M79662 Pain in left lower leg: Secondary | ICD-10-CM | POA: Diagnosis not present

## 2018-06-12 DIAGNOSIS — R1319 Other dysphagia: Secondary | ICD-10-CM | POA: Diagnosis not present

## 2018-06-12 DIAGNOSIS — R278 Other lack of coordination: Secondary | ICD-10-CM | POA: Diagnosis not present

## 2018-06-12 DIAGNOSIS — U071 COVID-19: Secondary | ICD-10-CM | POA: Diagnosis not present

## 2018-06-12 DIAGNOSIS — M79661 Pain in right lower leg: Secondary | ICD-10-CM | POA: Diagnosis not present

## 2018-06-12 DIAGNOSIS — R2681 Unsteadiness on feet: Secondary | ICD-10-CM | POA: Diagnosis not present

## 2018-06-13 DIAGNOSIS — U071 COVID-19: Secondary | ICD-10-CM | POA: Diagnosis not present

## 2018-06-13 DIAGNOSIS — J9690 Respiratory failure, unspecified, unspecified whether with hypoxia or hypercapnia: Secondary | ICD-10-CM | POA: Diagnosis not present

## 2018-06-13 DIAGNOSIS — R278 Other lack of coordination: Secondary | ICD-10-CM | POA: Diagnosis not present

## 2018-06-13 DIAGNOSIS — B349 Viral infection, unspecified: Secondary | ICD-10-CM | POA: Diagnosis not present

## 2018-06-13 DIAGNOSIS — M79661 Pain in right lower leg: Secondary | ICD-10-CM | POA: Diagnosis not present

## 2018-06-13 DIAGNOSIS — R1312 Dysphagia, oropharyngeal phase: Secondary | ICD-10-CM | POA: Diagnosis not present

## 2018-06-13 DIAGNOSIS — M79662 Pain in left lower leg: Secondary | ICD-10-CM | POA: Diagnosis not present

## 2018-06-13 DIAGNOSIS — R2681 Unsteadiness on feet: Secondary | ICD-10-CM | POA: Diagnosis not present

## 2018-06-13 DIAGNOSIS — R1319 Other dysphagia: Secondary | ICD-10-CM | POA: Diagnosis not present

## 2018-06-15 DIAGNOSIS — M79661 Pain in right lower leg: Secondary | ICD-10-CM | POA: Diagnosis not present

## 2018-06-15 DIAGNOSIS — M79662 Pain in left lower leg: Secondary | ICD-10-CM | POA: Diagnosis not present

## 2018-06-15 DIAGNOSIS — R1312 Dysphagia, oropharyngeal phase: Secondary | ICD-10-CM | POA: Diagnosis not present

## 2018-06-15 DIAGNOSIS — R2681 Unsteadiness on feet: Secondary | ICD-10-CM | POA: Diagnosis not present

## 2018-06-15 DIAGNOSIS — U071 COVID-19: Secondary | ICD-10-CM | POA: Diagnosis not present

## 2018-06-15 DIAGNOSIS — R278 Other lack of coordination: Secondary | ICD-10-CM | POA: Diagnosis not present

## 2018-06-15 DIAGNOSIS — J9690 Respiratory failure, unspecified, unspecified whether with hypoxia or hypercapnia: Secondary | ICD-10-CM | POA: Diagnosis not present

## 2018-06-15 DIAGNOSIS — R1319 Other dysphagia: Secondary | ICD-10-CM | POA: Diagnosis not present

## 2018-06-16 DIAGNOSIS — M79662 Pain in left lower leg: Secondary | ICD-10-CM | POA: Diagnosis not present

## 2018-06-16 DIAGNOSIS — R1319 Other dysphagia: Secondary | ICD-10-CM | POA: Diagnosis not present

## 2018-06-16 DIAGNOSIS — M79661 Pain in right lower leg: Secondary | ICD-10-CM | POA: Diagnosis not present

## 2018-06-16 DIAGNOSIS — J9690 Respiratory failure, unspecified, unspecified whether with hypoxia or hypercapnia: Secondary | ICD-10-CM | POA: Diagnosis not present

## 2018-06-16 DIAGNOSIS — R2681 Unsteadiness on feet: Secondary | ICD-10-CM | POA: Diagnosis not present

## 2018-06-16 DIAGNOSIS — U071 COVID-19: Secondary | ICD-10-CM | POA: Diagnosis not present

## 2018-06-16 DIAGNOSIS — R1312 Dysphagia, oropharyngeal phase: Secondary | ICD-10-CM | POA: Diagnosis not present

## 2018-06-16 DIAGNOSIS — R278 Other lack of coordination: Secondary | ICD-10-CM | POA: Diagnosis not present

## 2018-06-17 DIAGNOSIS — R1319 Other dysphagia: Secondary | ICD-10-CM | POA: Diagnosis not present

## 2018-06-17 DIAGNOSIS — M79661 Pain in right lower leg: Secondary | ICD-10-CM | POA: Diagnosis not present

## 2018-06-17 DIAGNOSIS — M79662 Pain in left lower leg: Secondary | ICD-10-CM | POA: Diagnosis not present

## 2018-06-17 DIAGNOSIS — R1312 Dysphagia, oropharyngeal phase: Secondary | ICD-10-CM | POA: Diagnosis not present

## 2018-06-17 DIAGNOSIS — U071 COVID-19: Secondary | ICD-10-CM | POA: Diagnosis not present

## 2018-06-17 DIAGNOSIS — R2681 Unsteadiness on feet: Secondary | ICD-10-CM | POA: Diagnosis not present

## 2018-06-17 DIAGNOSIS — J9690 Respiratory failure, unspecified, unspecified whether with hypoxia or hypercapnia: Secondary | ICD-10-CM | POA: Diagnosis not present

## 2018-06-17 DIAGNOSIS — R278 Other lack of coordination: Secondary | ICD-10-CM | POA: Diagnosis not present

## 2018-06-18 DIAGNOSIS — U071 COVID-19: Secondary | ICD-10-CM | POA: Diagnosis not present

## 2018-06-18 DIAGNOSIS — J9690 Respiratory failure, unspecified, unspecified whether with hypoxia or hypercapnia: Secondary | ICD-10-CM | POA: Diagnosis not present

## 2018-06-18 DIAGNOSIS — R2681 Unsteadiness on feet: Secondary | ICD-10-CM | POA: Diagnosis not present

## 2018-06-18 DIAGNOSIS — M79661 Pain in right lower leg: Secondary | ICD-10-CM | POA: Diagnosis not present

## 2018-06-18 DIAGNOSIS — M79662 Pain in left lower leg: Secondary | ICD-10-CM | POA: Diagnosis not present

## 2018-06-18 DIAGNOSIS — R278 Other lack of coordination: Secondary | ICD-10-CM | POA: Diagnosis not present

## 2018-06-18 DIAGNOSIS — R1312 Dysphagia, oropharyngeal phase: Secondary | ICD-10-CM | POA: Diagnosis not present

## 2018-06-18 DIAGNOSIS — R1319 Other dysphagia: Secondary | ICD-10-CM | POA: Diagnosis not present

## 2018-06-19 DIAGNOSIS — J984 Other disorders of lung: Secondary | ICD-10-CM | POA: Diagnosis not present

## 2018-06-19 DIAGNOSIS — R278 Other lack of coordination: Secondary | ICD-10-CM | POA: Diagnosis not present

## 2018-06-19 DIAGNOSIS — R1312 Dysphagia, oropharyngeal phase: Secondary | ICD-10-CM | POA: Diagnosis not present

## 2018-06-19 DIAGNOSIS — R1319 Other dysphagia: Secondary | ICD-10-CM | POA: Diagnosis not present

## 2018-06-19 DIAGNOSIS — I10 Essential (primary) hypertension: Secondary | ICD-10-CM | POA: Diagnosis not present

## 2018-06-19 DIAGNOSIS — U071 COVID-19: Secondary | ICD-10-CM | POA: Diagnosis not present

## 2018-06-19 DIAGNOSIS — J9690 Respiratory failure, unspecified, unspecified whether with hypoxia or hypercapnia: Secondary | ICD-10-CM | POA: Diagnosis not present

## 2018-06-19 DIAGNOSIS — M79661 Pain in right lower leg: Secondary | ICD-10-CM | POA: Diagnosis not present

## 2018-06-19 DIAGNOSIS — M79662 Pain in left lower leg: Secondary | ICD-10-CM | POA: Diagnosis not present

## 2018-06-19 DIAGNOSIS — R2681 Unsteadiness on feet: Secondary | ICD-10-CM | POA: Diagnosis not present

## 2018-06-20 DIAGNOSIS — R2681 Unsteadiness on feet: Secondary | ICD-10-CM | POA: Diagnosis not present

## 2018-06-20 DIAGNOSIS — M79661 Pain in right lower leg: Secondary | ICD-10-CM | POA: Diagnosis not present

## 2018-06-20 DIAGNOSIS — R1312 Dysphagia, oropharyngeal phase: Secondary | ICD-10-CM | POA: Diagnosis not present

## 2018-06-20 DIAGNOSIS — M79662 Pain in left lower leg: Secondary | ICD-10-CM | POA: Diagnosis not present

## 2018-06-20 DIAGNOSIS — R1319 Other dysphagia: Secondary | ICD-10-CM | POA: Diagnosis not present

## 2018-06-20 DIAGNOSIS — U071 COVID-19: Secondary | ICD-10-CM | POA: Diagnosis not present

## 2018-06-20 DIAGNOSIS — R278 Other lack of coordination: Secondary | ICD-10-CM | POA: Diagnosis not present

## 2018-06-20 DIAGNOSIS — J9690 Respiratory failure, unspecified, unspecified whether with hypoxia or hypercapnia: Secondary | ICD-10-CM | POA: Diagnosis not present

## 2018-06-21 DIAGNOSIS — J9690 Respiratory failure, unspecified, unspecified whether with hypoxia or hypercapnia: Secondary | ICD-10-CM | POA: Diagnosis not present

## 2018-06-21 DIAGNOSIS — M79661 Pain in right lower leg: Secondary | ICD-10-CM | POA: Diagnosis not present

## 2018-06-21 DIAGNOSIS — U071 COVID-19: Secondary | ICD-10-CM | POA: Diagnosis not present

## 2018-06-21 DIAGNOSIS — M79662 Pain in left lower leg: Secondary | ICD-10-CM | POA: Diagnosis not present

## 2018-06-21 DIAGNOSIS — R2681 Unsteadiness on feet: Secondary | ICD-10-CM | POA: Diagnosis not present

## 2018-06-21 DIAGNOSIS — R1319 Other dysphagia: Secondary | ICD-10-CM | POA: Diagnosis not present

## 2018-06-21 DIAGNOSIS — R1312 Dysphagia, oropharyngeal phase: Secondary | ICD-10-CM | POA: Diagnosis not present

## 2018-06-21 DIAGNOSIS — R278 Other lack of coordination: Secondary | ICD-10-CM | POA: Diagnosis not present

## 2018-06-22 DIAGNOSIS — M79661 Pain in right lower leg: Secondary | ICD-10-CM | POA: Diagnosis not present

## 2018-06-22 DIAGNOSIS — R278 Other lack of coordination: Secondary | ICD-10-CM | POA: Diagnosis not present

## 2018-06-22 DIAGNOSIS — M79662 Pain in left lower leg: Secondary | ICD-10-CM | POA: Diagnosis not present

## 2018-06-22 DIAGNOSIS — R2681 Unsteadiness on feet: Secondary | ICD-10-CM | POA: Diagnosis not present

## 2018-06-22 DIAGNOSIS — R1319 Other dysphagia: Secondary | ICD-10-CM | POA: Diagnosis not present

## 2018-06-22 DIAGNOSIS — U071 COVID-19: Secondary | ICD-10-CM | POA: Diagnosis not present

## 2018-06-22 DIAGNOSIS — J9690 Respiratory failure, unspecified, unspecified whether with hypoxia or hypercapnia: Secondary | ICD-10-CM | POA: Diagnosis not present

## 2018-06-22 DIAGNOSIS — R1312 Dysphagia, oropharyngeal phase: Secondary | ICD-10-CM | POA: Diagnosis not present

## 2018-06-23 DIAGNOSIS — M79661 Pain in right lower leg: Secondary | ICD-10-CM | POA: Diagnosis not present

## 2018-06-23 DIAGNOSIS — R1312 Dysphagia, oropharyngeal phase: Secondary | ICD-10-CM | POA: Diagnosis not present

## 2018-06-23 DIAGNOSIS — M79662 Pain in left lower leg: Secondary | ICD-10-CM | POA: Diagnosis not present

## 2018-06-23 DIAGNOSIS — R278 Other lack of coordination: Secondary | ICD-10-CM | POA: Diagnosis not present

## 2018-06-23 DIAGNOSIS — U071 COVID-19: Secondary | ICD-10-CM | POA: Diagnosis not present

## 2018-06-23 DIAGNOSIS — Z79899 Other long term (current) drug therapy: Secondary | ICD-10-CM | POA: Diagnosis not present

## 2018-06-23 DIAGNOSIS — R1319 Other dysphagia: Secondary | ICD-10-CM | POA: Diagnosis not present

## 2018-06-23 DIAGNOSIS — R2681 Unsteadiness on feet: Secondary | ICD-10-CM | POA: Diagnosis not present

## 2018-06-23 DIAGNOSIS — J9690 Respiratory failure, unspecified, unspecified whether with hypoxia or hypercapnia: Secondary | ICD-10-CM | POA: Diagnosis not present

## 2018-06-24 DIAGNOSIS — R278 Other lack of coordination: Secondary | ICD-10-CM | POA: Diagnosis not present

## 2018-06-24 DIAGNOSIS — M79662 Pain in left lower leg: Secondary | ICD-10-CM | POA: Diagnosis not present

## 2018-06-24 DIAGNOSIS — R1312 Dysphagia, oropharyngeal phase: Secondary | ICD-10-CM | POA: Diagnosis not present

## 2018-06-24 DIAGNOSIS — R2681 Unsteadiness on feet: Secondary | ICD-10-CM | POA: Diagnosis not present

## 2018-06-24 DIAGNOSIS — M79661 Pain in right lower leg: Secondary | ICD-10-CM | POA: Diagnosis not present

## 2018-06-24 DIAGNOSIS — J9690 Respiratory failure, unspecified, unspecified whether with hypoxia or hypercapnia: Secondary | ICD-10-CM | POA: Diagnosis not present

## 2018-06-24 DIAGNOSIS — R1319 Other dysphagia: Secondary | ICD-10-CM | POA: Diagnosis not present

## 2018-06-24 DIAGNOSIS — U071 COVID-19: Secondary | ICD-10-CM | POA: Diagnosis not present

## 2018-06-25 ENCOUNTER — Inpatient Hospital Stay (HOSPITAL_COMMUNITY)
Admission: EM | Admit: 2018-06-25 | Discharge: 2018-07-06 | DRG: 870 | Disposition: A | Discharge status: Skilled Nursing Facility | Payer: Medicare Other | Attending: Internal Medicine | Admitting: Internal Medicine

## 2018-06-25 ENCOUNTER — Emergency Department (HOSPITAL_COMMUNITY): Payer: Medicare Other

## 2018-06-25 ENCOUNTER — Inpatient Hospital Stay (HOSPITAL_COMMUNITY): Payer: Medicare Other

## 2018-06-25 DIAGNOSIS — L8990 Pressure ulcer of unspecified site, unspecified stage: Secondary | ICD-10-CM | POA: Insufficient documentation

## 2018-06-25 DIAGNOSIS — I7092 Chronic total occlusion of artery of the extremities: Secondary | ICD-10-CM | POA: Diagnosis not present

## 2018-06-25 DIAGNOSIS — J988 Other specified respiratory disorders: Secondary | ICD-10-CM | POA: Diagnosis not present

## 2018-06-25 DIAGNOSIS — Z1159 Encounter for screening for other viral diseases: Secondary | ICD-10-CM | POA: Diagnosis not present

## 2018-06-25 DIAGNOSIS — I6330 Cerebral infarction due to thrombosis of unspecified cerebral artery: Secondary | ICD-10-CM | POA: Diagnosis not present

## 2018-06-25 DIAGNOSIS — B9689 Other specified bacterial agents as the cause of diseases classified elsewhere: Secondary | ICD-10-CM | POA: Diagnosis not present

## 2018-06-25 DIAGNOSIS — I10 Essential (primary) hypertension: Secondary | ICD-10-CM | POA: Diagnosis not present

## 2018-06-25 DIAGNOSIS — F0151 Vascular dementia with behavioral disturbance: Secondary | ICD-10-CM | POA: Diagnosis not present

## 2018-06-25 DIAGNOSIS — G9349 Other encephalopathy: Secondary | ICD-10-CM | POA: Diagnosis not present

## 2018-06-25 DIAGNOSIS — Z7902 Long term (current) use of antithrombotics/antiplatelets: Secondary | ICD-10-CM

## 2018-06-25 DIAGNOSIS — G40901 Epilepsy, unspecified, not intractable, with status epilepticus: Secondary | ICD-10-CM | POA: Diagnosis not present

## 2018-06-25 DIAGNOSIS — G40401 Other generalized epilepsy and epileptic syndromes, not intractable, with status epilepticus: Secondary | ICD-10-CM | POA: Diagnosis present

## 2018-06-25 DIAGNOSIS — Z4682 Encounter for fitting and adjustment of non-vascular catheter: Secondary | ICD-10-CM | POA: Diagnosis not present

## 2018-06-25 DIAGNOSIS — N390 Urinary tract infection, site not specified: Secondary | ICD-10-CM | POA: Diagnosis present

## 2018-06-25 DIAGNOSIS — G9341 Metabolic encephalopathy: Secondary | ICD-10-CM | POA: Diagnosis not present

## 2018-06-25 DIAGNOSIS — J449 Chronic obstructive pulmonary disease, unspecified: Secondary | ICD-10-CM | POA: Diagnosis not present

## 2018-06-25 DIAGNOSIS — M2550 Pain in unspecified joint: Secondary | ICD-10-CM | POA: Diagnosis not present

## 2018-06-25 DIAGNOSIS — A4151 Sepsis due to Escherichia coli [E. coli]: Principal | ICD-10-CM | POA: Diagnosis present

## 2018-06-25 DIAGNOSIS — Z978 Presence of other specified devices: Secondary | ICD-10-CM

## 2018-06-25 DIAGNOSIS — I739 Peripheral vascular disease, unspecified: Secondary | ICD-10-CM | POA: Diagnosis present

## 2018-06-25 DIAGNOSIS — Z87442 Personal history of urinary calculi: Secondary | ICD-10-CM | POA: Diagnosis not present

## 2018-06-25 DIAGNOSIS — R569 Unspecified convulsions: Secondary | ICD-10-CM

## 2018-06-25 DIAGNOSIS — G9340 Encephalopathy, unspecified: Secondary | ICD-10-CM | POA: Diagnosis not present

## 2018-06-25 DIAGNOSIS — J9601 Acute respiratory failure with hypoxia: Secondary | ICD-10-CM | POA: Diagnosis not present

## 2018-06-25 DIAGNOSIS — E872 Acidosis: Secondary | ICD-10-CM | POA: Diagnosis present

## 2018-06-25 DIAGNOSIS — E876 Hypokalemia: Secondary | ICD-10-CM | POA: Diagnosis not present

## 2018-06-25 DIAGNOSIS — G92 Toxic encephalopathy: Secondary | ICD-10-CM | POA: Diagnosis present

## 2018-06-25 DIAGNOSIS — R404 Transient alteration of awareness: Secondary | ICD-10-CM | POA: Diagnosis not present

## 2018-06-25 DIAGNOSIS — D649 Anemia, unspecified: Secondary | ICD-10-CM | POA: Diagnosis present

## 2018-06-25 DIAGNOSIS — Z885 Allergy status to narcotic agent status: Secondary | ICD-10-CM

## 2018-06-25 DIAGNOSIS — F0281 Dementia in other diseases classified elsewhere with behavioral disturbance: Secondary | ICD-10-CM | POA: Diagnosis not present

## 2018-06-25 DIAGNOSIS — J9690 Respiratory failure, unspecified, unspecified whether with hypoxia or hypercapnia: Secondary | ICD-10-CM | POA: Diagnosis present

## 2018-06-25 DIAGNOSIS — R29898 Other symptoms and signs involving the musculoskeletal system: Secondary | ICD-10-CM | POA: Diagnosis present

## 2018-06-25 DIAGNOSIS — Z8673 Personal history of transient ischemic attack (TIA), and cerebral infarction without residual deficits: Secondary | ICD-10-CM

## 2018-06-25 DIAGNOSIS — Z9114 Patient's other noncompliance with medication regimen: Secondary | ICD-10-CM

## 2018-06-25 DIAGNOSIS — I70209 Unspecified atherosclerosis of native arteries of extremities, unspecified extremity: Secondary | ICD-10-CM | POA: Diagnosis present

## 2018-06-25 DIAGNOSIS — L89156 Pressure-induced deep tissue damage of sacral region: Secondary | ICD-10-CM | POA: Diagnosis not present

## 2018-06-25 DIAGNOSIS — G40909 Epilepsy, unspecified, not intractable, without status epilepticus: Secondary | ICD-10-CM | POA: Diagnosis not present

## 2018-06-25 DIAGNOSIS — Z87891 Personal history of nicotine dependence: Secondary | ICD-10-CM

## 2018-06-25 DIAGNOSIS — R739 Hyperglycemia, unspecified: Secondary | ICD-10-CM | POA: Diagnosis not present

## 2018-06-25 DIAGNOSIS — F0391 Unspecified dementia with behavioral disturbance: Secondary | ICD-10-CM | POA: Diagnosis present

## 2018-06-25 DIAGNOSIS — R7881 Bacteremia: Secondary | ICD-10-CM | POA: Diagnosis not present

## 2018-06-25 DIAGNOSIS — Z91018 Allergy to other foods: Secondary | ICD-10-CM

## 2018-06-25 DIAGNOSIS — R339 Retention of urine, unspecified: Secondary | ICD-10-CM | POA: Diagnosis not present

## 2018-06-25 DIAGNOSIS — F0390 Unspecified dementia without behavioral disturbance: Secondary | ICD-10-CM | POA: Diagnosis present

## 2018-06-25 DIAGNOSIS — E785 Hyperlipidemia, unspecified: Secondary | ICD-10-CM | POA: Diagnosis present

## 2018-06-25 DIAGNOSIS — R4020 Unspecified coma: Secondary | ICD-10-CM | POA: Diagnosis not present

## 2018-06-25 DIAGNOSIS — B961 Klebsiella pneumoniae [K. pneumoniae] as the cause of diseases classified elsewhere: Secondary | ICD-10-CM | POA: Diagnosis present

## 2018-06-25 DIAGNOSIS — M109 Gout, unspecified: Secondary | ICD-10-CM | POA: Diagnosis not present

## 2018-06-25 DIAGNOSIS — J9600 Acute respiratory failure, unspecified whether with hypoxia or hypercapnia: Secondary | ICD-10-CM | POA: Diagnosis not present

## 2018-06-25 DIAGNOSIS — R918 Other nonspecific abnormal finding of lung field: Secondary | ICD-10-CM | POA: Diagnosis not present

## 2018-06-25 DIAGNOSIS — Z823 Family history of stroke: Secondary | ICD-10-CM

## 2018-06-25 DIAGNOSIS — Z880 Allergy status to penicillin: Secondary | ICD-10-CM

## 2018-06-25 DIAGNOSIS — R0902 Hypoxemia: Secondary | ICD-10-CM | POA: Diagnosis not present

## 2018-06-25 DIAGNOSIS — J9811 Atelectasis: Secondary | ICD-10-CM | POA: Diagnosis not present

## 2018-06-25 DIAGNOSIS — Z9911 Dependence on respirator [ventilator] status: Secondary | ICD-10-CM | POA: Diagnosis not present

## 2018-06-25 DIAGNOSIS — Z7401 Bed confinement status: Secondary | ICD-10-CM | POA: Diagnosis not present

## 2018-06-25 DIAGNOSIS — Z8249 Family history of ischemic heart disease and other diseases of the circulatory system: Secondary | ICD-10-CM

## 2018-06-25 DIAGNOSIS — J9621 Acute and chronic respiratory failure with hypoxia: Secondary | ICD-10-CM | POA: Diagnosis present

## 2018-06-25 DIAGNOSIS — J439 Emphysema, unspecified: Secondary | ICD-10-CM | POA: Diagnosis not present

## 2018-06-25 DIAGNOSIS — R000 Tachycardia, unspecified: Secondary | ICD-10-CM | POA: Diagnosis not present

## 2018-06-25 DIAGNOSIS — Z79899 Other long term (current) drug therapy: Secondary | ICD-10-CM

## 2018-06-25 LAB — CBC WITH DIFFERENTIAL/PLATELET
Abs Immature Granulocytes: 0.22 10*3/uL — ABNORMAL HIGH (ref 0.00–0.07)
Basophils Absolute: 0.1 10*3/uL (ref 0.0–0.1)
Basophils Relative: 0 %
Eosinophils Absolute: 0 10*3/uL (ref 0.0–0.5)
Eosinophils Relative: 0 %
HCT: 44.6 % (ref 39.0–52.0)
Hemoglobin: 14.5 g/dL (ref 13.0–17.0)
Immature Granulocytes: 1 %
Lymphocytes Relative: 2 %
Lymphs Abs: 0.4 10*3/uL — ABNORMAL LOW (ref 0.7–4.0)
MCH: 31.9 pg (ref 26.0–34.0)
MCHC: 32.5 g/dL (ref 30.0–36.0)
MCV: 98 fL (ref 80.0–100.0)
Monocytes Absolute: 1.3 10*3/uL — ABNORMAL HIGH (ref 0.1–1.0)
Monocytes Relative: 6 %
Neutro Abs: 21.7 10*3/uL — ABNORMAL HIGH (ref 1.7–7.7)
Neutrophils Relative %: 91 %
Platelets: 433 10*3/uL — ABNORMAL HIGH (ref 150–400)
RBC: 4.55 MIL/uL (ref 4.22–5.81)
RDW: 15.3 % (ref 11.5–15.5)
WBC: 23.7 10*3/uL — ABNORMAL HIGH (ref 4.0–10.5)
nRBC: 0 % (ref 0.0–0.2)

## 2018-06-25 LAB — URINALYSIS, ROUTINE W REFLEX MICROSCOPIC
Bilirubin Urine: NEGATIVE
Glucose, UA: NEGATIVE mg/dL
Ketones, ur: 5 mg/dL — AB
Nitrite: NEGATIVE
Protein, ur: 100 mg/dL — AB
Specific Gravity, Urine: 1.015 (ref 1.005–1.030)
pH: 5 (ref 5.0–8.0)

## 2018-06-25 LAB — COMPREHENSIVE METABOLIC PANEL
ALT: 26 U/L (ref 0–44)
AST: 37 U/L (ref 15–41)
Albumin: 3.6 g/dL (ref 3.5–5.0)
Alkaline Phosphatase: 87 U/L (ref 38–126)
Anion gap: 23 — ABNORMAL HIGH (ref 5–15)
BUN: 13 mg/dL (ref 8–23)
CO2: 12 mmol/L — ABNORMAL LOW (ref 22–32)
Calcium: 9.6 mg/dL (ref 8.9–10.3)
Chloride: 104 mmol/L (ref 98–111)
Creatinine, Ser: 1.16 mg/dL (ref 0.61–1.24)
GFR calc Af Amer: >60 mL/min (ref >60)
GFR calc non Af Amer: >60 mL/min (ref >60)
Glucose, Bld: 148 mg/dL — ABNORMAL HIGH (ref 70–99)
Potassium: 3.7 mmol/L (ref 3.5–5.1)
Sodium: 139 mmol/L (ref 135–145)
Total Bilirubin: 0.4 mg/dL (ref 0.3–1.2)
Total Protein: 7.7 g/dL (ref 6.5–8.1)

## 2018-06-25 LAB — POCT I-STAT 7, (LYTES, BLD GAS, ICA,H+H)
Acid-base deficit: 4 mmol/L — ABNORMAL HIGH (ref 0.0–2.0)
Bicarbonate: 21.6 mmol/L (ref 20.0–28.0)
Calcium, Ion: 1.21 mmol/L (ref 1.15–1.40)
HCT: 41 % (ref 39.0–52.0)
Hemoglobin: 13.9 g/dL (ref 13.0–17.0)
O2 Saturation: 100 %
Patient temperature: 99.8
Potassium: 3.6 mmol/L (ref 3.5–5.1)
Sodium: 140 mmol/L (ref 135–145)
TCO2: 23 mmol/L (ref 22–32)
pCO2 arterial: 43.2 mmHg (ref 32.0–48.0)
pH, Arterial: 7.311 — ABNORMAL LOW (ref 7.350–7.450)
pO2, Arterial: 467 mmHg — ABNORMAL HIGH (ref 83.0–108.0)

## 2018-06-25 LAB — MAGNESIUM: Magnesium: 2.1 mg/dL (ref 1.7–2.4)

## 2018-06-25 LAB — BLOOD CULTURE ID PANEL (REFLEXED)

## 2018-06-25 LAB — LACTIC ACID, PLASMA
Lactic Acid, Venous: >11 mmol/L (ref 0.5–1.9)
Lactic Acid, Venous: 5.3 mmol/L (ref 0.5–1.9)

## 2018-06-25 LAB — PHOSPHORUS: Phosphorus: 2.1 mg/dL — ABNORMAL LOW (ref 2.5–4.6)

## 2018-06-25 LAB — PROCALCITONIN: Procalcitonin: 2.83 ng/mL

## 2018-06-25 LAB — TRIGLYCERIDES: Triglycerides: 103 mg/dL (ref <150)

## 2018-06-25 LAB — TROPONIN I: Troponin I: 0.07 ng/mL (ref <0.03)

## 2018-06-25 LAB — SARS CORONAVIRUS 2: SARS Coronavirus 2: NOT DETECTED

## 2018-06-25 LAB — GLUCOSE, CAPILLARY: Glucose-Capillary: 86 mg/dL (ref 70–99)

## 2018-06-25 LAB — CBG MONITORING, ED: Glucose-Capillary: 144 mg/dL — ABNORMAL HIGH (ref 70–99)

## 2018-06-25 LAB — CARBAMAZEPINE LEVEL, TOTAL: Carbamazepine Lvl: <2 ug/mL — ABNORMAL LOW (ref 4.0–12.0)

## 2018-06-25 LAB — VALPROIC ACID LEVEL: Valproic Acid Lvl: 15 ug/mL — ABNORMAL LOW (ref 50.0–100.0)

## 2018-06-25 MED ORDER — LACOSAMIDE 50 MG PO TABS
300.0000 mg | ORAL_TABLET | Freq: Two times a day (BID) | ORAL | Status: DC
  Administered 2018-06-25 – 2018-06-30 (×10): 300 mg
  Filled 2018-06-25 (×10): qty 6

## 2018-06-25 MED ORDER — SODIUM CHLORIDE 0.9 % IV BOLUS (SEPSIS)
1000.0000 mL | Freq: Once | INTRAVENOUS | Status: AC
  Administered 2018-06-25: 1000 mL via INTRAVENOUS

## 2018-06-25 MED ORDER — LORAZEPAM 2 MG/ML IJ SOLN: 1.0000 mg | INTRAMUSCULAR | Status: DC | PRN

## 2018-06-25 MED ORDER — CARBAMAZEPINE 200 MG PO TABS: 400.0000 mg | ORAL_TABLET | Freq: Two times a day (BID) | ORAL | Status: DC

## 2018-06-25 MED ORDER — SODIUM CHLORIDE 0.9 % IV SOLN: 2.0000 g | Freq: Once | INTRAVENOUS | Status: DC

## 2018-06-25 MED ORDER — VANCOMYCIN HCL IN DEXTROSE 1-5 GM/200ML-% IV SOLN: 1000.0000 mg | Freq: Once | INTRAVENOUS | Status: DC

## 2018-06-25 MED ORDER — FENTANYL CITRATE (PF) 100 MCG/2ML IJ SOLN
50.0000 ug | INTRAMUSCULAR | Status: DC | PRN
  Administered 2018-06-29 – 2018-06-30 (×4): 50 ug via INTRAVENOUS
  Filled 2018-06-25 (×4): qty 2

## 2018-06-25 MED ORDER — METRONIDAZOLE IN NACL 5-0.79 MG/ML-% IV SOLN
500.0000 mg | Freq: Once | INTRAVENOUS | Status: AC
  Administered 2018-06-25: 500 mg via INTRAVENOUS
  Filled 2018-06-25: qty 100

## 2018-06-25 MED ORDER — SODIUM CHLORIDE 0.9 % IV SOLN
2.0000 g | INTRAVENOUS | Status: DC
  Administered 2018-06-25 – 2018-06-26 (×2): 2 g via INTRAVENOUS
  Filled 2018-06-25: qty 20
  Filled 2018-06-25: qty 2
  Filled 2018-06-25: qty 20

## 2018-06-25 MED ORDER — ACETAMINOPHEN 325 MG PO TABS: 650.0000 mg | ORAL_TABLET | ORAL | Status: DC | PRN

## 2018-06-25 MED ORDER — LORAZEPAM 2 MG/ML IJ SOLN
4.0000 mg | INTRAMUSCULAR | Status: AC | PRN
  Administered 2018-06-25: 2 mg via INTRAVENOUS
  Administered 2018-06-25: 4 mg via INTRAVENOUS

## 2018-06-25 MED ORDER — VANCOMYCIN HCL 10 G IV SOLR
1250.0000 mg | Freq: Once | INTRAVENOUS | Status: AC
  Administered 2018-06-25: 1250 mg via INTRAVENOUS
  Filled 2018-06-25: qty 1250

## 2018-06-25 MED ORDER — LORAZEPAM 2 MG/ML IJ SOLN
INTRAMUSCULAR | Status: AC
  Filled 2018-06-25: qty 2

## 2018-06-25 MED ORDER — SODIUM CHLORIDE 0.9 % IV SOLN
2.0000 g | Freq: Once | INTRAVENOUS | Status: AC
  Administered 2018-06-25: 2 g via INTRAVENOUS
  Filled 2018-06-25: qty 2

## 2018-06-25 MED ORDER — LORAZEPAM 2 MG/ML IJ SOLN
1.0000 mg | INTRAMUSCULAR | Status: AC | PRN
  Administered 2018-06-25 – 2018-06-28 (×8): 2 mg via INTRAVENOUS
  Filled 2018-06-25 (×8): qty 1

## 2018-06-25 MED ORDER — ONDANSETRON HCL 4 MG/2ML IJ SOLN: 4.0000 mg | Freq: Four times a day (QID) | INTRAMUSCULAR | Status: DC | PRN

## 2018-06-25 MED ORDER — JEVITY 1.2 CAL PO LIQD
1000.0000 mL | ORAL | Status: DC
  Filled 2018-06-25 (×2): qty 1000

## 2018-06-25 MED ORDER — ROCURONIUM BROMIDE 50 MG/5ML IV SOLN
INTRAVENOUS | Status: AC | PRN
  Administered 2018-06-25: 100 mg via INTRAVENOUS

## 2018-06-25 MED ORDER — FENTANYL CITRATE (PF) 100 MCG/2ML IJ SOLN: 50.0000 ug | INTRAMUSCULAR | Status: DC | PRN

## 2018-06-25 MED ORDER — PROPOFOL 1000 MG/100ML IV EMUL
INTRAVENOUS | Status: AC
  Administered 2018-06-25: 5 ug/kg/min via INTRAVENOUS
  Filled 2018-06-25: qty 100

## 2018-06-25 MED ORDER — LEVETIRACETAM 100 MG/ML PO SOLN
1500.0000 mg | Freq: Two times a day (BID) | ORAL | Status: DC
  Administered 2018-06-25 – 2018-06-27 (×5): 1500 mg
  Filled 2018-06-25 (×6): qty 15

## 2018-06-25 MED ORDER — LACTATED RINGERS IV SOLN
INTRAVENOUS | Status: DC
  Administered 2018-06-25: 12:00:00 via INTRAVENOUS
  Administered 2018-06-25: 50 mL/h via INTRAVENOUS
  Administered 2018-06-26 – 2018-06-27 (×2): via INTRAVENOUS

## 2018-06-25 MED ORDER — SODIUM CHLORIDE 0.9% FLUSH
10.0000 mL | Freq: Two times a day (BID) | INTRAVENOUS | Status: DC
  Administered 2018-06-25 – 2018-07-06 (×17): 10 mL

## 2018-06-25 MED ORDER — ETOMIDATE 2 MG/ML IV SOLN
INTRAVENOUS | Status: AC | PRN
  Administered 2018-06-25: 20 mg via INTRAVENOUS

## 2018-06-25 MED ORDER — VALPROATE SODIUM 500 MG/5ML IV SOLN
375.0000 mg | Freq: Three times a day (TID) | INTRAVENOUS | Status: DC
  Administered 2018-06-25 – 2018-06-29 (×12): 375 mg via INTRAVENOUS
  Filled 2018-06-25 (×15): qty 3.75

## 2018-06-25 MED ORDER — CHLORHEXIDINE GLUCONATE CLOTH 2 % EX PADS
6.0000 | MEDICATED_PAD | Freq: Every day | CUTANEOUS | Status: DC
  Administered 2018-06-29 – 2018-07-06 (×4): 6 via TOPICAL

## 2018-06-25 MED ORDER — LEVETIRACETAM IN NACL 1000 MG/100ML IV SOLN
1000.0000 mg | Freq: Once | INTRAVENOUS | Status: AC
  Administered 2018-06-25: 1000 mg via INTRAVENOUS
  Filled 2018-06-25: qty 100

## 2018-06-25 MED ORDER — ACETAMINOPHEN 325 MG PO TABS
650.0000 mg | ORAL_TABLET | ORAL | Status: DC | PRN
  Administered 2018-07-03: 650 mg
  Filled 2018-06-25: qty 2

## 2018-06-25 MED ORDER — PROPOFOL 1000 MG/100ML IV EMUL
0.0000 ug/kg/min | INTRAVENOUS | Status: DC
  Administered 2018-06-25 (×2): 5 ug/kg/min via INTRAVENOUS
  Administered 2018-06-25: 20 ug/kg/min via INTRAVENOUS
  Administered 2018-06-26: 30 ug/kg/min via INTRAVENOUS
  Administered 2018-06-26 – 2018-06-27 (×2): 15 ug/kg/min via INTRAVENOUS
  Filled 2018-06-25 (×4): qty 100

## 2018-06-25 MED ORDER — PANTOPRAZOLE SODIUM 40 MG IV SOLR
40.0000 mg | Freq: Every day | INTRAVENOUS | Status: DC
  Administered 2018-06-25 – 2018-06-29 (×5): 40 mg via INTRAVENOUS
  Filled 2018-06-25 (×5): qty 40

## 2018-06-25 MED ORDER — LORAZEPAM 2 MG/ML IJ SOLN
INTRAMUSCULAR | Status: AC
  Administered 2018-06-25: 2 mg via INTRAVENOUS
  Filled 2018-06-25: qty 1

## 2018-06-25 MED ORDER — ENOXAPARIN SODIUM 40 MG/0.4ML ~~LOC~~ SOLN
40.0000 mg | SUBCUTANEOUS | Status: DC
  Administered 2018-06-26 – 2018-07-05 (×10): 40 mg via SUBCUTANEOUS
  Filled 2018-06-25 (×10): qty 0.4

## 2018-06-25 MED ORDER — CLOPIDOGREL BISULFATE 75 MG PO TABS
75.0000 mg | ORAL_TABLET | Freq: Every day | ORAL | Status: DC
  Administered 2018-06-26 – 2018-07-06 (×11): 75 mg
  Filled 2018-06-25 (×11): qty 1

## 2018-06-25 MED ORDER — SODIUM CHLORIDE 0.9% FLUSH: 10.0000 mL | INTRAVENOUS | Status: DC | PRN

## 2018-06-25 MED ORDER — LACOSAMIDE 150 MG PO TABS: 300.0000 mg | ORAL_TABLET | Freq: Two times a day (BID) | ORAL | Status: DC

## 2018-06-25 MED ORDER — CLOPIDOGREL BISULFATE 75 MG PO TABS: 75.0000 mg | ORAL_TABLET | Freq: Every day | ORAL | Status: DC

## 2018-06-25 MED ORDER — INSULIN ASPART 100 UNIT/ML ~~LOC~~ SOLN
0.0000 [IU] | SUBCUTANEOUS | Status: DC
  Administered 2018-06-30 – 2018-07-02 (×2): 2 [IU] via SUBCUTANEOUS

## 2018-06-25 MED ORDER — CARBAMAZEPINE 200 MG PO TABS
400.0000 mg | ORAL_TABLET | Freq: Two times a day (BID) | ORAL | Status: DC
  Administered 2018-06-25 – 2018-06-28 (×8): 400 mg
  Filled 2018-06-25 (×10): qty 2

## 2018-06-25 MED ORDER — DIVALPROEX SODIUM 250 MG PO DR TAB: 400.0000 mg | DELAYED_RELEASE_TABLET | Freq: Three times a day (TID) | ORAL | Status: DC

## 2018-06-25 NOTE — ED Notes (Signed)
Critical Care NP at bedside - patient exhibiting rigidity of legs and hands and eyes rolled back in head - new as of last hourly round at 8am. Given verbal order for 2mg  Ativan (see MAR) - patient relaxed after giving.

## 2018-06-25 NOTE — Consult Note (Addendum)
Neurology Consultation  Reason for Consult: Seizure Referring Physician: Dr. Craige CottaSood  CC: Seizure  History is obtained from: chart only  HPI: Lee [name] is a 68 y.o. male with past medical history of stroke, seizures, hypertension, alcohol abuse, dementia who resides at a dementia unit.  Patient was recently seen on March 2020 in the hospital for GTC activity while in route to [facility].  At that time it was noted the VPA level was less than 10.  Depakote dosage was increased to 500 mg 3 times daily, along with Vimpat dosage increased to 300 mg twice daily.  Patient at this time is brought back to the hospital on 6/17 followed placed witnessed 10 to 15-minute seizure by staff at memory living center.  It was reported to be tonic-clonic in nature.  In route he was given 2 of Versed.  Patient was intubated for airway protection along with a low-dose of Keppra 1000 mg and of lorazepam.  Currently patient is attached EEG which does not show any seizure activity but muscle artifact.  Tegretol level <2 VPA level 15  ROS:  Unable to obtain due to altered mental status.   Past Medical History:  Diagnosis Date  . Aneurysm of femoral artery (HCC)   . Arthritis    "anwhere I've been hurt before" (01/22/2012)  . COPD (chronic obstructive pulmonary disease) (HCC)   . Dementia (HCC)   . ETOH abuse   . Gout   . Grand mal epilepsy, controlled (HCC) 1980's   last on 10/10/14  . Hypertension   . Kidney stone   . Peripheral vascular disease (HCC)   . Seizures (HCC)    last grand mal seizures Aug 2018  . Stroke [facility](HCC) Sept. 2012   denies residual (01/22/2012)   Family History  Problem Relation Age of Onset  . Diabetes Mother   . Aneurysm Mother   . Hypertension Mother   . Stroke Father   . Heart disease Father   . Seizures Other        Nephew  . Brain cancer Other        Nephew  . Colon cancer Maternal Aunt   . Colon cancer Maternal Uncle    Social History:   reports that he quit  smoking about 4 years ago. His smoking use included cigarettes. He has a 42.00 pack-year smoking history. He has never used smokeless tobacco. He reports that he does not drink alcohol or use drugs.  Medications  Current Facility-Administered Medications:  .  acetaminophen (TYLENOL) tablet 650 mg, 650 mg, Oral, Q4H PRN, Bowser, Kaylyn LayerGrace E, NP .  carbamazepine (TEGRETOL) tablet 400 mg, 400 mg, Per Tube, BID, Bowser, Kaylyn LayerGrace E, NP .  fentaNYL (SUBLIMAZE) injection 50 mcg, 50 mcg, Intravenous, Q15 min PRN, Rancour, Stephen, MD .  fentaNYL (SUBLIMAZE) injection 50 mcg, 50 mcg, Intravenous, Q2H PRN, Rancour, Stephen, MD .  insulin aspart (novoLOG) injection 0-15 Units, 0-15 Units, Subcutaneous, Q4H, Bowser, Kaylyn LayerGrace E, NP .  Lacosamide TABS 300 mg, 300 mg, Per Tube, BID, Bowser, Kaylyn LayerGrace E, NP .  lactated ringers infusion, , Intravenous, Continuous, Bowser, Kaylyn LayerGrace E, NP .  levETIRAcetam (KEPPRA) 100 MG/ML solution 1,500 mg, 1,500 mg, Per Tube, BID, Bowser, Kaylyn LayerGrace E, NP .  LORazepam (ATIVAN) injection 1 mg, 1 mg, Intravenous, Q4H PRN, Bowser, Grace E, NP .  LORazepam (ATIVAN) injection 4 mg, 4 mg, Intravenous, Q5 Min x 2 PRN, Bowser, Grace E, NP .  metroNIDAZOLE (FLAGYL) IVPB 500 mg, 500 mg, Intravenous, Once,  Rancour, Jeannett SeniorStephen, MD .  ondansetron [facility](ZOFRAN) injection 4 mg, 4 mg, Intravenous, Q6H PRN, Bowser, Grace E, NP .  pantoprazole (PROTONIX) injection 40 mg, 40 mg, Intravenous, QHS, Bowser, Grace E, NP .  propofol (DIPRIVAN) 1000 MG/100ML infusion, 0-50 mcg/kg/min, Intravenous, Continuous, Rancour, Stephen, MD, Last Rate: 2 mL/hr at 06/25/18 0645, 5 mcg/kg/min at 06/25/18 0645 .  valproate (DEPACON) 375 mg in dextrose 5 % 50 mL IVPB, 375 mg, Intravenous, Q8H, Bowser, Grace E, NP  Current Outpatient Medications:  .  acetaminophen (TYLENOL) 500 MG tablet, Take 1,000 mg by mouth every 8 (eight) hours. , Disp: , Rfl:  .  carbamazepine (EPITOL) 200 MG tablet, Take 2 tablets (400 mg total) by mouth 2 (two) times  daily., Disp: 360 tablet, Rfl: 3 .  cholecalciferol (VITAMIN D) 1000 units tablet, Take 1,000 Units by mouth daily. , Disp: , Rfl:  .  clopidogrel (PLAVIX) 75 MG tablet, Take 1 tablet (75 mg total) by mouth daily., Disp: 90 tablet, Rfl: 4 .  cyclobenzaprine (FLEXERIL) 5 MG tablet, Take 5 mg by mouth every 12 (twelve) hours. For muscle spasms. Hold if sleepy., Disp: , Rfl:  .  DEXTRAN 70-HYPROMELLOSE OP, Place 2 drops into the left eye 2 (two) times daily. Continue to flush left eye as tolerated., Disp: , Rfl:  .  divalproex (DEPAKOTE) 125 MG DR tablet, Take 3 tablets (375 mg total) by mouth 3 (three) times daily., Disp: 270 tablet, Rfl: 1 .  docusate sodium (COLACE) 100 MG capsule, Take 1 capsule (100 mg total) by mouth 2 (two) times daily. (Patient taking differently: Take 100 mg by mouth 2 (two) times daily as needed. ), Disp: 10 capsule, Rfl: 0 .  levETIRAcetam (KEPPRA) 750 MG tablet, Take 2 tablets (1,500 mg total) by mouth 2 (two) times daily., Disp: 360 tablet, Rfl: 4 .  loratadine (CLARITIN) 10 MG tablet, Take 10 mg by mouth daily. , Disp: , Rfl:  .  Multiple Vitamins-Minerals (DECUBI-VITE) CAPS, Take 1 capsule by mouth daily. To promote wound healing., Disp: , Rfl:  .  NON FORMULARY, Apply 1 mL topically 2 (two) times daily. ABH gel (Ativan 1mg Benito Mccreedy/Benadryl 12.5mg Bing Matter/Haldol 1mg ). Apply 1mL topically to forearm twice daily for mood disorder., Disp: , Rfl:  .  ondansetron (ZOFRAN) 4 MG tablet, Take 1 tablet (4 mg total) by mouth every 6 (six) hours as needed for nausea., Disp: 20 tablet, Rfl: 0 .  senna (SENOKOT) 8.6 MG TABS tablet, Take 2 tablets by mouth 2 (two) times daily., Disp: , Rfl:  .  VIMPAT 150 MG TABS, Take 300 mg by mouth 2 (two) times daily., Disp: , Rfl:   Exam: Current vital signs: BP 136/86   Pulse 97   Temp 99.8 F (37.7 C) (Rectal)   Resp 18   Ht 5\' 6"  (1.676 m)   Wt 66.5 kg   SpO2 100%   BMI 23.66 kg/m  Vital signs in last 24 hours: Temp:  [99.8 F (37.7 C)] 99.8 F  (37.7 C) (06/17 0634) Pulse Rate:  [97-123] 97 (06/17 0815) Resp:  [15-22] 18 (06/17 0815) BP: (111-164)/(80-100) 136/86 (06/17 0815) SpO2:  [97 %-100 %] 100 % (06/17 0815) FiO2 (%):  [100 %] 100 % (06/17 0714) Weight:  [66.5 kg] 66.5 kg (06/17 0634)  Physical Exam  Constitutional: Appears well-developed and well-nourished.  Psych: Intubated Eyes: No scleral injection HENT: No OP obstrucion Head: Normocephalic.  Cardiovascular: Normal rate and regular rhythm.  Respiratory: Effort normal, non-labored breathing GI: Soft.  No distension.  There is no tenderness.  Skin: WDI Neuro: Mental Status: Is currently intubated and sedated. Cranial Nerves: II: patient does not respond confrontation bilaterally,  III,IV,VI: 2 mm and sluggish  VIII: patient does not respond to verbal stimuli IX,X: gag reflex present, XI: trapezius strength unable to test bilaterally XII: tongue strength unable to test Motor: Patient is moving bilateral upper extremities with increased tone right greater than left.  He is moving all extremities volitionally with increase tone R>L. [place] bilaterally R>L. cog wheeling on the R>L. Some myoclonus.   Sensory:  responds to noxious stimuli in any extremity. Cerebellar: Unable to perform  Labs I have reviewed labs in epic and the results pertinent to this consultation are:  CBC    Component Value Date/Time   WBC 23.7 (H) 06/25/2018 0639   RBC 4.55 06/25/2018 0639   HGB 13.9 06/25/2018 0759   HGB 14.9 07/31/2017 1503   HCT 41.0 06/25/2018 0759   HCT 44.6 07/31/2017 1503   PLT 433 (H) 06/25/2018 0639   PLT 383 07/31/2017 1503   MCV 98.0 06/25/2018 0639   MCV 96 07/31/2017 1503   MCH 31.9 06/25/2018 0639   MCHC 32.5 06/25/2018 0639   RDW 15.3 06/25/2018 0639   RDW 14.5 07/31/2017 1503   LYMPHSABS 0.4 (L) 06/25/2018 0639   LYMPHSABS 2.5 07/31/2017 1503   MONOABS 1.3 (H) 06/25/2018 0639   EOSABS 0.0 06/25/2018 0639   EOSABS 0.2 07/31/2017 1503    BASOSABS 0.1 06/25/2018 0639   BASOSABS 0.0 07/31/2017 1503   CMP     Component Value Date/Time   NA 140 06/25/2018 0759   NA 145 (H) 07/31/2017 1503   K 3.6 06/25/2018 0759   CL 104 06/25/2018 0639   CO2 12 (L) 06/25/2018 0639   GLUCOSE 148 (H) 06/25/2018 0639   BUN 13 06/25/2018 0639   BUN 9 07/31/2017 1503   CREATININE 1.16 06/25/2018 0639   CALCIUM 9.6 06/25/2018 0639   PROT 7.7 06/25/2018 0639   PROT 7.3 07/31/2017 1503   ALBUMIN 3.6 06/25/2018 0639   ALBUMIN 4.4 07/31/2017 1503   AST 37 06/25/2018 0639   ALT 26 06/25/2018 0639   ALKPHOS 87 06/25/2018 0639   BILITOT 0.4 06/25/2018 0639   BILITOT <0.2 07/31/2017 1503   GFRNONAA >60 06/25/2018 0639   GFRAA >60 06/25/2018 [id number]   Imaging I have reviewed the images obtained:  CT-scan of the brain: IMPRESSION: 1. Stable atrophy and white matter disease. 2. Acute intracranial abnormality. 3. Atherosclerosis.  EEG: Slowing and few left temporoparietal epileptiform discharges.  No evidence of ongoing seizures.  -- Felicie Mornavid Smith PA-C [facility] [phone]  M-F  (9:00 am- 5:00 PM)  06/25/2018, 9:27 AM   Attending Neurohospitalist Addendum Patient seen and examined with APP/Resident. Agree with the history and physical as documented above. Agree with the plan as documented, which I helped formulate. I have independently reviewed the chart, obtained history, review of systems and examined the patient.I have personally reviewed pertinent head/neck/spine imaging (CT/MRI).     Assessment:  68/M with PMH of stroke, seizure, noncompliance to medication, hypertension, alcohol abuse, resident of a dementia unit, presenting with break through seizure in setting of medication non-compliance.  He has had a similar presentation in March requiring intubation. -e was intubated today also for airway protection. Spot EEG negative for status epilepticus or ongoing seizures but did show some left temporal epileptiform  discharges. Will benefit from long-term monitoring.  Recommendations: -LTM EEG ordered-we will follow. -Continue Keppra, Depakote, Vimpat  and Tegratol (when able to swallow or via tube) at home dose -Seizure precautions -Will make adjustments to AED as needed   -- Amie Portland, MD [facility] Pager: [phone] If 7pm to 7am, please call on call as listed on AMION.  CRITICAL CARE ATTESTATION Performed by: Amie Portland, MD Total critical care time: 30 minutes Critical care time was exclusive of separately billable procedures and treating other patients and/or supervising APPs/Residents/Students Critical care was necessary to treat or prevent imminent or life-threatening deterioration due to status epilepticus This patient is critically ill and at significant risk for neurological worsening and/or death and care requires constant monitoring. Critical care was time spent personally by me on the following activities: development of treatment plan with patient and/or surrogate as well as nursing, discussions with consultants, evaluation of patient's response to treatment, examination of patient, obtaining history from patient or surrogate, ordering and performing treatments and interventions, ordering and review of laboratory studies, ordering and review of radiographic studies, pulse oximetry, re-evaluation of patient's condition, participation in multidisciplinary rounds and medical decision making of high complexity in the care of this patient.
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
## 2018-06-25 NOTE — Progress Notes (Signed)
LTM setup Dr. Rory Percy notified
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
## 2018-06-25 NOTE — ED Triage Notes (Signed)
BIB EMS from [facility], pt had witnessed seizure lasting approx 10-15 min. Pt presents to ED unresponsive, not following any commands. Tachycardic, warm to touch. Attempted to contact pt family with no answer.

## 2018-06-25 NOTE — Progress Notes (Signed)
EEG complete - results pending 

## 2018-06-25 NOTE — Progress Notes (Signed)
PHARMACY - PHYSICIAN COMMUNICATION CRITICAL VALUE ALERT - BLOOD CULTURE IDENTIFICATION (BCID)  Lee [name] is an 68 y.o. male who presented to [facility] on 06/25/2018 with a chief complaint of seizures.   Assessment:  Had leukocytosis (23.7) with elevated initial LA (>11 now 5.3) - initially started on vanc, cefepime, and flagyl. PCT 2.83. BCx 1/2 bottles growing GNR - BCID showing E Coli.   Name of physician (or Provider) Contacted: Dr Jeannene Patella  Current antibiotics: None (only 1 time doses in ED)  Changes to prescribed antibiotics recommended:  Recommendations accepted by provider - Will add on Ceftriaxone 2 g IV every 24 hours and awaiting sensitivities   Results for orders placed or performed during the hospital encounter of 06/25/18  Blood Culture ID Panel (Reflexed) (Collected: 06/25/2018  6:50 AM)  Result Value Ref Range   Enterococcus species NOT DETECTED NOT DETECTED   Listeria monocytogenes NOT DETECTED NOT DETECTED   Staphylococcus species NOT DETECTED NOT DETECTED   Staphylococcus aureus (BCID) NOT DETECTED NOT DETECTED   Streptococcus species NOT DETECTED NOT DETECTED   Streptococcus agalactiae NOT DETECTED NOT DETECTED   Streptococcus pneumoniae NOT DETECTED NOT DETECTED   Streptococcus pyogenes NOT DETECTED NOT DETECTED   Acinetobacter baumannii NOT DETECTED NOT DETECTED   Enterobacteriaceae species DETECTED (A) NOT DETECTED   Enterobacter cloacae complex NOT DETECTED NOT DETECTED   Escherichia coli DETECTED (A) NOT DETECTED   Klebsiella oxytoca NOT DETECTED NOT DETECTED   Klebsiella pneumoniae NOT DETECTED NOT DETECTED   Proteus species NOT DETECTED NOT DETECTED   Serratia marcescens NOT DETECTED NOT DETECTED   Carbapenem resistance NOT DETECTED NOT DETECTED   Haemophilus influenzae NOT DETECTED NOT DETECTED   Neisseria meningitidis NOT DETECTED NOT DETECTED   Pseudomonas aeruginosa NOT DETECTED NOT DETECTED   Candida albicans NOT DETECTED NOT DETECTED   Candida glabrata NOT DETECTED NOT DETECTED   Candida krusei NOT DETECTED NOT DETECTED   Candida parapsilosis NOT DETECTED NOT DETECTED   Candida tropicalis NOT DETECTED NOT DETECTED   Antonietta Jewel, PharmD, BCCCP Clinical Pharmacist  Pager: [phone] Phone: [phone] 06/25/2018  8:47 PM

## 2018-06-25 NOTE — Progress Notes (Signed)
PCCM Brief Interval Note   I have spoken with the patient's wife, Lee [name] and provided updates regarding her husband's illness course at present.  The patient's wife is very upset on the phone and indicates that the patient resides at a nursing facility where he is to receive his medications and skilled care.  She stated that the facility notified her this morning of the patient's seizure, transportation to ED, and informed her that the patient has not received his AEDs for "several days." The patient's wife states that this occurred earlier this spring, and she was again notified by the facility of this seizure after he was taken to the ED and then told that he had not received his medications.   I discussed the current course with the wife including the Keppra given in ED, resuming AEDs, the results of CT Head (no acute abnormalities) and that EEG is underway.    P Social Work [place] MSN, AGACNP-BC [place] [phone] If no answer, [phone] 06/25/2018, 10:43 AM

## 2018-06-25 NOTE — ED Notes (Signed)
RN and RRT with patient to CT.

## 2018-06-25 NOTE — ED Notes (Signed)
Spoke with patient's daughter and updated her on his status. Admitting to also call and answer other questions, but family very touched to hear something back because they had not heard from anyone since they were told patient was going to the emergency department.   Daughter, Stefano Trulson may be reached at [phone] OR [phone].

## 2018-06-25 NOTE — Procedures (Signed)
[facility] Thursa Emme A. Gerilyn Pilgrimoonquah, MD     [email or web address]           HISTORY: The patient is a 68 year old male who presents with seizures.  MEDICATIONS:  Current Facility-Administered Medications:  .  acetaminophen (TYLENOL) tablet 650 mg, 650 mg, Oral, Q4H PRN, Bowser, Kaylyn LayerGrace E, NP .  carbamazepine (TEGRETOL) tablet 400 mg, 400 mg, Per Tube, BID, Bowser, Kaylyn LayerGrace E, NP .  fentaNYL (SUBLIMAZE) injection 50 mcg, 50 mcg, Intravenous, Q15 min PRN, Rancour, Stephen, MD .  fentaNYL (SUBLIMAZE) injection 50 mcg, 50 mcg, Intravenous, Q2H PRN, Rancour, Stephen, MD .  insulin aspart (novoLOG) injection 0-15 Units, 0-15 Units, Subcutaneous, Q4H, Bowser, Grace E, NP .  lacosamide (VIMPAT) tablet 300 mg, 300 mg, Per Tube, BID, Bowser, Kaylyn LayerGrace E, NP .  lactated ringers infusion, , Intravenous, Continuous, Bowser, Kaylyn LayerGrace E, NP .  levETIRAcetam (KEPPRA) 100 MG/ML solution 1,500 mg, 1,500 mg, Per Tube, BID, Bowser, Kaylyn LayerGrace E, NP .  LORazepam (ATIVAN) injection 1 mg, 1 mg, Intravenous, Q4H PRN, Bowser, Grace E, NP .  LORazepam (ATIVAN) injection 4 mg, 4 mg, Intravenous, Q5 Min x 2 PRN, Bowser, Grace E, NP .  metroNIDAZOLE (FLAGYL) IVPB 500 mg, 500 mg, Intravenous, Once, Rancour, Stephen, MD, Last Rate: 100 mL/hr at 06/25/18 0934, 500 mg at 06/25/18 0934 .  ondansetron (ZOFRAN) injection 4 mg, 4 mg, Intravenous, Q6H PRN, Bowser, Grace E, NP .  pantoprazole (PROTONIX) injection 40 mg, 40 mg, Intravenous, QHS, Bowser, Grace E, NP .  propofol (DIPRIVAN) 1000 MG/100ML infusion, 0-50 mcg/kg/min, Intravenous, Continuous, Rancour, Stephen, MD, Last Rate: 2 mL/hr at 06/25/18 0645, 5 mcg/kg/min at 06/25/18 0645 .  valproate (DEPACON) 375 mg in dextrose 5 % 50 mL IVPB, 375 mg, Intravenous, Q8H, Bowser, Grace E, NP  Current Outpatient Medications:  .  acetaminophen (TYLENOL) 500 MG tablet, Take 1,000 mg by mouth every 8 (eight) hours. , Disp: , Rfl:  .  albuterol (VENTOLIN HFA) 108 (90 Base) MCG/ACT inhaler,  Inhale 2 puffs into the lungs as needed., Disp: , Rfl:  .  carbamazepine (EPITOL) 200 MG tablet, Take 2 tablets (400 mg total) by mouth 2 (two) times daily., Disp: 360 tablet, Rfl: 3 .  cholecalciferol (VITAMIN D) 1000 units tablet, Take 1,000 Units by mouth daily. , Disp: , Rfl:  .  clopidogrel (PLAVIX) 75 MG tablet, Take 1 tablet (75 mg total) by mouth daily., Disp: 90 tablet, Rfl: 4 .  cyclobenzaprine (FLEXERIL) 5 MG tablet, Take 5 mg by mouth daily. For muscle spasms. Hold if sleepy., Disp: , Rfl:  .  DEXTRAN 70-HYPROMELLOSE OP, Place 2 drops into the left eye 2 (two) times daily. Continue to flush left eye as tolerated., Disp: , Rfl:  .  divalproex (DEPAKOTE) 125 MG DR tablet, Take 3 tablets (375 mg total) by mouth 3 (three) times daily. (Patient taking differently: Take 500 mg by mouth 3 (three) times daily. ), Disp: 270 tablet, Rfl: 1 .  docusate sodium (COLACE) 100 MG capsule, Take 1 capsule (100 mg total) by mouth 2 (two) times daily. (Patient taking differently: Take 100 mg by mouth 2 (two) times daily as needed. ), Disp: 10 capsule, Rfl: 0 .  levETIRAcetam (KEPPRA) 750 MG tablet, Take 2 tablets (1,500 mg total) by mouth 2 (two) times daily., Disp: 360 tablet, Rfl: 4 .  loratadine (CLARITIN) 10 MG tablet, Take 10 mg by mouth every other day. , Disp: , Rfl:  .  Multiple Vitamins-Minerals (DECUBI-VITE) CAPS, Take 1 capsule by mouth daily.  To promote wound healing., Disp: , Rfl:  .  NON FORMULARY, Apply 1 mL topically 2 (two) times daily. ABH gel (Ativan 1mg Lenard Galloway 12.5mg /Haldol 1mg ). Apply 110mL topically to forearm twice daily for mood disorder., Disp: , Rfl:  .  ondansetron (ZOFRAN) 4 MG tablet, Take 1 tablet (4 mg total) by mouth every 6 (six) hours as needed for nausea., Disp: 20 tablet, Rfl: 0 .  senna (SENOKOT) 8.6 MG TABS tablet, Take 2 tablets by mouth at bedtime. , Disp: , Rfl:  .  VIMPAT 150 MG TABS, Take 300 mg by mouth 2 (two) times daily., Disp: , Rfl:      ANALYSIS: A 16  channel recording using standard 10 20 measurements is conducted for 22 minutes.   There is a low-voltage background posterior dominant rhythm of 10 hertz which attenuates somewhat with eye opening.  There is typical beta activity observed in the frontal areas.  There is increased myogenic artifact seen time to time throughout the recording.  Photic stimulation and hyperventilation were not conducted.  There are few episodes of sharp wave activity that phase reverses at T3.  There is a couple episodes of shaking on the right side but not clearly associated with electrographic changes.  Focal or lateralized slowing not appreciated.   IMPRESSION: 1.   This recording shows few episodes of left temporal epileptiform discharges.  No evidence of ongoing electrographic seizures however.      Lelaina Oatis A. Merlene Laughter, M.D.  Diplomate, Tax adviser of Psychiatry and Neurology ( Neurology).

## 2018-06-25 NOTE — ED Notes (Signed)
Patient becoming very tense, curling legs up and to the left and very rigid - squeezing sheets very tightly. See MAR for prn Ativan.

## 2018-06-26 ENCOUNTER — Inpatient Hospital Stay (HOSPITAL_COMMUNITY): Payer: Medicare Other

## 2018-06-26 DIAGNOSIS — J9621 Acute and chronic respiratory failure with hypoxia: Secondary | ICD-10-CM | POA: Diagnosis present

## 2018-06-26 DIAGNOSIS — J9601 Acute respiratory failure with hypoxia: Secondary | ICD-10-CM | POA: Diagnosis present

## 2018-06-26 LAB — RAPID URINE DRUG SCREEN, HOSP PERFORMED
Amphetamines: NOT DETECTED
Barbiturates: NOT DETECTED
Benzodiazepines: POSITIVE — AB
Cocaine: NOT DETECTED
Opiates: NOT DETECTED
Tetrahydrocannabinol: NOT DETECTED

## 2018-06-26 LAB — MAGNESIUM
Magnesium: 1.7 mg/dL (ref 1.7–2.4)
Magnesium: 1.8 mg/dL (ref 1.7–2.4)
Magnesium: 2.1 mg/dL (ref 1.7–2.4)

## 2018-06-26 LAB — BASIC METABOLIC PANEL
Anion gap: 9 (ref 5–15)
BUN: 6 mg/dL — ABNORMAL LOW (ref 8–23)
CO2: 22 mmol/L (ref 22–32)
Calcium: 8.5 mg/dL — ABNORMAL LOW (ref 8.9–10.3)
Chloride: 107 mmol/L (ref 98–111)
Creatinine, Ser: 0.55 mg/dL — ABNORMAL LOW (ref 0.61–1.24)
GFR calc Af Amer: >60 mL/min (ref >60)
GFR calc non Af Amer: >60 mL/min (ref >60)
Glucose, Bld: 89 mg/dL (ref 70–99)
Potassium: 3.3 mmol/L — ABNORMAL LOW (ref 3.5–5.1)
Sodium: 138 mmol/L (ref 135–145)

## 2018-06-26 LAB — CBC
HCT: 35.4 % — ABNORMAL LOW (ref 39.0–52.0)
Hemoglobin: 12.2 g/dL — ABNORMAL LOW (ref 13.0–17.0)
MCH: 32.4 pg (ref 26.0–34.0)
MCHC: 34.5 g/dL (ref 30.0–36.0)
MCV: 94.1 fL (ref 80.0–100.0)
Platelets: 287 10*3/uL (ref 150–400)
RBC: 3.76 MIL/uL — ABNORMAL LOW (ref 4.22–5.81)
RDW: 15.3 % (ref 11.5–15.5)
WBC: 14 10*3/uL — ABNORMAL HIGH (ref 4.0–10.5)
nRBC: 0 % (ref 0.0–0.2)

## 2018-06-26 LAB — GLUCOSE, CAPILLARY
Glucose-Capillary: 73 mg/dL (ref 70–99)
Glucose-Capillary: 81 mg/dL (ref 70–99)
Glucose-Capillary: 83 mg/dL (ref 70–99)
Glucose-Capillary: 84 mg/dL (ref 70–99)
Glucose-Capillary: 85 mg/dL (ref 70–99)
Glucose-Capillary: 92 mg/dL (ref 70–99)
Glucose-Capillary: 93 mg/dL (ref 70–99)

## 2018-06-26 LAB — PHOSPHORUS
Phosphorus: 2 mg/dL — ABNORMAL LOW (ref 2.5–4.6)
Phosphorus: 2 mg/dL — ABNORMAL LOW (ref 2.5–4.6)
Phosphorus: 2.9 mg/dL (ref 2.5–4.6)

## 2018-06-26 LAB — LEVETIRACETAM LEVEL: Levetiracetam Lvl: 20.9 ug/mL (ref 10.0–40.0)

## 2018-06-26 MED ORDER — PRO-STAT SUGAR FREE PO LIQD
60.0000 mL | Freq: Two times a day (BID) | ORAL | Status: DC
  Administered 2018-06-26 – 2018-06-30 (×8): 60 mL
  Filled 2018-06-26 (×9): qty 60

## 2018-06-26 MED ORDER — PRO-STAT SUGAR FREE PO LIQD
30.0000 mL | Freq: Two times a day (BID) | ORAL | Status: DC
  Administered 2018-06-26: 11:00:00 30 mL
  Filled 2018-06-26: qty 30

## 2018-06-26 MED ORDER — POTASSIUM PHOSPHATES 15 MMOLE/5ML IV SOLN
20.0000 mmol | Freq: Once | INTRAVENOUS | Status: AC
  Administered 2018-06-26: 20 mmol via INTRAVENOUS
  Filled 2018-06-26: qty 6.67

## 2018-06-26 MED ORDER — CHLORHEXIDINE GLUCONATE 0.12% ORAL RINSE (MEDLINE KIT)
15.0000 mL | Freq: Two times a day (BID) | OROMUCOSAL | Status: DC
  Administered 2018-06-26 – 2018-06-30 (×10): 15 mL via OROMUCOSAL

## 2018-06-26 MED ORDER — ORAL CARE MOUTH RINSE
15.0000 mL | OROMUCOSAL | Status: DC
  Administered 2018-06-26 – 2018-06-30 (×45): 15 mL via OROMUCOSAL

## 2018-06-26 MED ORDER — MAGNESIUM SULFATE 2 GM/50ML IV SOLN
2.0000 g | Freq: Once | INTRAVENOUS | Status: AC
  Administered 2018-06-26: 2 g via INTRAVENOUS
  Filled 2018-06-26: qty 50

## 2018-06-26 MED ORDER — JEVITY 1.2 CAL PO LIQD
1000.0000 mL | ORAL | Status: DC
  Administered 2018-06-27: 1000 mL
  Filled 2018-06-26 (×4): qty 1000

## 2018-06-26 MED ORDER — VITAL HIGH PROTEIN PO LIQD: 1000.0000 mL | ORAL | Status: DC

## 2018-06-26 NOTE — Progress Notes (Signed)
LTM maintenance completed; fixed A1, checked for skin breakdown at Fp1 and Fp2, no skin breakdown was seen.

## 2018-06-26 NOTE — Procedures (Signed)
Continuous Video-EEG Monitoring Report  Study Duration:  06/25/2018 09:38 to 06/26/2018 09:30 CPT Code:   39030 Diagnosis and Code: Status epilepticus (G40.901)  History: This is a 68 year old male presenting with status epilepticus.  Continuous video-EEG monitoring was performed to evaluate for seizures.  Technical Details:  Long-term video-EEG monitoring was performed using standard setting per the guidelines.  Briefly, a minimum of 21 electrodes were placed on scalp according to the International 10-20 or/and 10-10 Systems.  Supplemental electrodes were placed as needed.  Single EKG electrode was also used to detect cardiac arrhythmia.  Patient's behavior was continuously recorded on video simultaneously with EEG.  A minimum of 16 channels were used for data display.  Each epoch of study was reviewed manually daily and as needed using standard referential and bipolar montages.    EEG Description:  There was generalized polymorphic delta slowing.  No posterior dominant rhythm or sleep architecture was recorded.  Diffuse beta activity was present, which was most likely due to medication effect (propofol).  Intermittent focal sharp waves were present in the right anterior temporal region.  No seizure was captured.    Impression:  This is an abnormal EEG due to the presence of the following: 1) Generalized polymorphic delta slowing, suggesting severe encephalopathy, but medication effect (propofol) could not be excluded; 2) Intermittent epileptiform discharges in the right anterior temporal region, suggesting risk of focal epileptogenicity.  No seizure was captured.                    Reading Physician: Winfield Cunas, MD, PhD

## 2018-06-26 NOTE — Progress Notes (Signed)
EEG with no evidence of status epilepticus.  Intermittent epileptiform discharges in the right anterior temporal region suggesting risk of focal epileptogenic city. Otherwise generalized polymorphic delta activity suggesting severe encephalopathy but medication effect cannot be excluded.  Updated recommendations -Continue on current antiepileptics -Maintain on LTM for another day -Reduce sedation as much as possible -MRI after LTM was discontinued -Correction of toxic metabolic derangements per primary team as you are  We will continue to follow with you  -- Amie Portland, MD [facility] Pager: [phone] If 7pm to 7am, please call on call as listed on AMION.

## 2018-06-26 NOTE — Progress Notes (Signed)
Neurology Progress Note   S:// Patient seen and examined in the ICU this morning. Remains intubated Reported multiple seizures overnight-event button on the EEG machine pressed. Formal EEG read pending  O:// Current vital signs: BP (!) 154/89   Pulse 83   Temp 98.3 F (36.8 C) (Oral)   Resp 18   Ht '5\' 6"'$  (1.676 m)   Wt 66.5 kg   SpO2 100%   BMI 23.66 kg/m  Vital signs in last 24 hours: Temp:  [97.8 F (36.6 C)-100 F (37.8 C)] 98.3 F (36.8 C) (06/18 0400) Pulse Rate:  [80-105] 83 (06/18 0738) Resp:  [16-19] 18 (06/18 0800) BP: (94-156)/(64-111) 154/89 (06/18 0800) SpO2:  [100 %] 100 % (06/18 0738) FiO2 (%):  [40 %-60 %] 40 % (06/18 0738) General: Sedated intubated in no distress HEENT: Normocephalic atraumatic Lungs: Clear to auscultation Cardiovascular regular rate rhythm Abdomen: Nondistended nontender Extremities: Decreased muscle mass, no edema Neurological exam Sedated intubated No spontaneous movements Does not follow commands Attempts to open eyes to loud voice and noxious stimulation. Cranial nerves: Pupils equal round reactive to light, no gaze deviation or preference, does not blink to threat from either side, facial symmetry difficult to ascertain. Motor exam: Mild increased tone in all 4 extremities with some cogwheeling.  Increased tone is worse in the lower extremities compared to upper extremities. Sensory exam: Minimal withdrawal to noxious stimulation in all 4. Coordination: Cannot assess Gait: Cannot assess DTRs: Unable to elicit in any of the extremities  Medications  Current Facility-Administered Medications:  .  acetaminophen (TYLENOL) tablet 650 mg, 650 mg, Per Tube, Q4H PRN, Chesley Mires, MD .  carbamazepine (TEGRETOL) tablet 400 mg, 400 mg, Per Tube, BID, Bowser, Laurel Dimmer, NP, 400 mg at 06/25/18 2157 .  cefTRIAXone (ROCEPHIN) 2 g in sodium chloride 0.9 % 100 mL IVPB, 2 g, Intravenous, Q24H, Kamat, Lovenia Kim, MD, Stopped at 06/25/18 2227 .   chlorhexidine gluconate (MEDLINE KIT) (PERIDEX) 0.12 % solution 15 mL, 15 mL, Mouth Rinse, BID, Halford Chessman, Vineet, MD, 15 mL at 06/26/18 0807 .  Chlorhexidine Gluconate Cloth 2 % PADS 6 each, 6 each, Topical, Q0600, Chesley Mires, MD .  clopidogrel (PLAVIX) tablet 75 mg, 75 mg, Per Tube, Daily, Sood, Vineet, MD .  enoxaparin (LOVENOX) injection 40 mg, 40 mg, Subcutaneous, Q24H, Sood, Vineet, MD .  feeding supplement (JEVITY 1.2 CAL) liquid 1,000 mL, 1,000 mL, Per Tube, Continuous, Sood, Vineet, MD .  fentaNYL (SUBLIMAZE) injection 50 mcg, 50 mcg, Intravenous, Q2H PRN, Rancour, Stephen, MD .  insulin aspart (novoLOG) injection 0-15 Units, 0-15 Units, Subcutaneous, Q4H, Bowser, Grace E, NP .  lacosamide (VIMPAT) tablet 300 mg, 300 mg, Per Tube, BID, Bowser, Laurel Dimmer, NP, 300 mg at 06/25/18 2156 .  lactated ringers infusion, , Intravenous, Continuous, Bowser, Grace E, NP, Last Rate: 50 mL/hr at 06/26/18 0600 .  levETIRAcetam (KEPPRA) 100 MG/ML solution 1,500 mg, 1,500 mg, Per Tube, BID, Bowser, Laurel Dimmer, NP, 1,500 mg at 06/26/18 2595 .  LORazepam (ATIVAN) injection 1-2 mg, 1-2 mg, Intravenous, Q2H PRN, Bowser, Laurel Dimmer, NP, 2 mg at 06/26/18 0511 .  MEDLINE mouth rinse, 15 mL, Mouth Rinse, 10 times per day, Chesley Mires, MD, 15 mL at 06/26/18 0621 .  ondansetron (ZOFRAN) injection 4 mg, 4 mg, Intravenous, Q6H PRN, Bowser, Grace E, NP .  pantoprazole (PROTONIX) injection 40 mg, 40 mg, Intravenous, QHS, Bowser, Grace E, NP, 40 mg at 06/25/18 2157 .  propofol (DIPRIVAN) 1000 MG/100ML infusion, 0-50 mcg/kg/min, Intravenous, Continuous,  Rancour, Annie Main, MD, Last Rate: 14.4 mL/hr at 06/26/18 0600, 30 mcg/kg/min at 06/26/18 0600 .  sodium chloride flush (NS) 0.9 % injection 10-40 mL, 10-40 mL, Intracatheter, Q12H, Chesley Mires, MD, 10 mL at 06/25/18 2158 .  sodium chloride flush (NS) 0.9 % injection 10-40 mL, 10-40 mL, Intracatheter, PRN, Chesley Mires, MD .  valproate (DEPACON) 375 mg in dextrose 5 % 50 mL IVPB, 375  mg, Intravenous, Q8H, Bowser, Grace E, NP, Last Rate: 53.8 mL/hr at 06/26/18 0623, 375 mg at 06/26/18 0623 Labs CBC    Component Value Date/Time   WBC 14.0 (H) 06/26/2018 0419   RBC 3.76 (L) 06/26/2018 0419   HGB 12.2 (L) 06/26/2018 0419   HGB 14.9 07/31/2017 1503   HCT 35.4 (L) 06/26/2018 0419   HCT 44.6 07/31/2017 1503   PLT 287 06/26/2018 0419   PLT 383 07/31/2017 1503   MCV 94.1 06/26/2018 0419   MCV 96 07/31/2017 1503   MCH 32.4 06/26/2018 0419   MCHC 34.5 06/26/2018 0419   RDW 15.3 06/26/2018 0419   RDW 14.5 07/31/2017 1503   LYMPHSABS 0.4 (L) 06/25/2018 0639   LYMPHSABS 2.5 07/31/2017 1503   MONOABS 1.3 (H) 06/25/2018 0639   EOSABS 0.0 06/25/2018 0639   EOSABS 0.2 07/31/2017 1503   BASOSABS 0.1 06/25/2018 0639   BASOSABS 0.0 07/31/2017 1503    CMP     Component Value Date/Time   NA 138 06/26/2018 0419   NA 145 (H) 07/31/2017 1503   K 3.3 (L) 06/26/2018 0419   CL 107 06/26/2018 0419   CO2 22 06/26/2018 0419   GLUCOSE 89 06/26/2018 0419   BUN 6 (L) 06/26/2018 0419   BUN 9 07/31/2017 1503   CREATININE 0.55 (L) 06/26/2018 0419   CALCIUM 8.5 (L) 06/26/2018 0419   PROT 7.7 06/25/2018 0639   PROT 7.3 07/31/2017 1503   ALBUMIN 3.6 06/25/2018 0639   ALBUMIN 4.4 07/31/2017 1503   AST 37 06/25/2018 0639   ALT 26 06/25/2018 0639   ALKPHOS 87 06/25/2018 0639   BILITOT 0.4 06/25/2018 0639   BILITOT <0.2 07/31/2017 1503   GFRNONAA >60 06/26/2018 0419   GFRAA >60 06/26/2018 0419  Leukocytosis with white count of 14.0 improving from yesterday.  Creatinine 0.55 Blood cultures positive for Enterobacter and E. coli Urine culture positive for gram-negative rods.   Imaging I have reviewed images in epic and the results pertinent to this consultation are: CT head with no acute changes.  Neurodiagnostics Spot EEG yesterday showed slowing and few left frontoparietal epileptiform discharges but no evidence of ongoing seizures. Long-term EEG pending  Assessment:  68 year old man with history of stroke, seizures, resident at a memory care facility with reported noncompliance to medications, hypertension, bring in with breakthrough seizures in the setting of medication noncompliance.  Had to be intubated for airway protection. Spot EEG with left temporal epileptiform discharges-pending formal read of the overnight long-term EEG. Blood cultures and urine culture suggestive of infection/sepsis-be investigated by the primary team  Impression: Likely breakthrough seizures and status epilepticus-in the setting of toxic metabolic derangements and medication noncompliance  Recommendations: We will follow the formal EEG report from overnight Continue current antiepileptics Maintain seizure precautions MRI when stable to evaluate for any new strokes We will make adjustments to her antiepileptics as needed. Management of toxic metabolic derangements including infections per primary team.  -- Amie Portland, MD [facility] Pager: [phone] If 7pm to 7am, please call on call as listed on AMION.  CRITICAL CARE ATTESTATION Performed  by: Amie Portland, MD Total critical care time: 30 minutes Critical care time was exclusive of separately billable procedures and treating other patients and/or supervising APPs/Residents/Students Critical care was necessary to treat or prevent imminent or life-threatening deterioration due to toxic metabolic encephalopathy, seizures, status epilepticus This patient is critically ill and at significant risk for neurological worsening and/or death and care requires constant monitoring. Critical care was time spent personally by me on the following activities: development of treatment plan with patient and/or surrogate as well as nursing, discussions with consultants, evaluation of patient's response to treatment, examination of patient, obtaining history from patient or surrogate, ordering and performing treatments and  interventions, ordering and review of laboratory studies, ordering and review of radiographic studies, pulse oximetry, re-evaluation of patient's condition, participation in multidisciplinary rounds and medical decision making of high complexity in the care of this patient.

## 2018-06-26 NOTE — Progress Notes (Signed)
NAME:  Lee [name], MRN:  [id number], DOB:  1950-09-14, LOS: 1 ADMISSION DATE:  06/25/2018, CONSULTATION DATE:  06/25/18  REFERRING MD: Rancour - EM  , CHIEF COMPLAINT:  Seizure disorder, acute respiratory failure    Brief History   68 yo M w/ dementia, seizures, presents 6/17 following witnessed 10-15 minute GTC seizure from nursing home.  Intubated for airway protection.     Past Medical History  CVA Dementia Seizure disorder HTN  Significant Hospital Events   6/17 admitted  Consults:  Neurology   Procedures:  EEG 6/17 >>> Slowing and few left temporoparietal epileptiform discharges.  No evidence of ongoing seizures. LTM 6/18 >>  Significant Diagnostic Tests:  CT Head non-con 6/17 > stable white matter changes and atrophy from prior. No evidence of acute infarction, hemorrhage or mass lesion. Remote appearing lacunar infarct L posterior inferior cerebellum   CXR 6/17> no focal opacities. Emphysemous appearing changes. ETT in satisfactory position   Micro Data:  6/17 SARS Cov2 - not detected 6/17 BCx >> e.coli  6/17 UCx >> 40K colonies GNR >> 6/17 Tracheal aspirate>>   Antimicrobials:  Vanc 6/17  Cefepime 6/17 Flagyl 6/17 6/17 ceftriaxone >>  Interim history/subjective:  BCs positive for E.coli, UC with GNR, pending sensitives, switched to ceftriaxone Ongoing LTM-  Had events/ seizures overnight, given multiple doses of ativan, pending read Currently propofol at 15 mcg/kg/min, reduced from 30- patient has not f/c, responding to noxious stimuli  On my exam found to have eye fluttering and nystagmus, tx with ativan and propofol increased.   Objective   Blood pressure (!) 111/99, pulse 83, temperature (!) 97.3 F (36.3 C), temperature source Oral, resp. rate 18, height 5\' 6"  (1.676 m), weight 66.5 kg, SpO2 100 %.    Vent Mode: PRVC FiO2 (%):  [40 %-60 %] 40 % Set Rate:  [18 bmp] 18 bmp Vt Set:  [510 mL] 510 mL PEEP:  [5 cmH20] 5 cmH20 Plateau Pressure:   [12 cmH20-17 cmH20] 15 cmH20   Intake/Output Summary (Last 24 hours) at 06/26/2018 1020 Last data filed at 06/26/2018 0600 Gross per 24 hour  Intake 1978.06 ml  Output 650 ml  Net 1328.06 ml   Filed Weights   06/25/18 0634  Weight: 66.5 kg   Examination: General:  Acutely ill on chronically ill frail, emaciated older male HEENT: MM pink/moist, ETT/ OGT, upward gaze with eye fluttering and nystagmus, unable to assess pupils, anicteric  Neuro: unresponsive, rigid extremities  CV: rr, no murmur PULM: even/non-labored, lungs bilaterally clear, MV supported breaths ON:GEXB, non-tender, bs active, foley present Extremities: warm/dry, no edema  Skin: no rashes or lesions  Resolved Hospital Problem list    Assessment & Plan:   Seizure -history of non-compliance with AEDs, resides in nursing home -home AEDs- Vimpat 300mg  BID, depakote 375 mg DR TID, Keppra 1500 mg BID, carbamazepine 400 mg BID  -CT Head non-con reveals no acute abnormality (06/25/18)  P Appreciate Neurology recs Ongoing LTM Ativan prn seizure AED per neurology- tegretol, vimpat, keppra, valproate,  trend levels PAD protocol with propofol, prn fentanyl   Acute respiratory failure requiring mechanical ventilation - in setting of seizure - CXR reviewed 6/18, ETT 1.5 cm above carina, lungs clear P Retract ETT by 2 cm Full MV support, PRVC 8 cc/kg PEEP/FiO2 for SpO2 > 92% VAP bundle No SBT given ongoing suspected seizures  Trend CXR   Lactic Acidosis Hypokalemia, hypophos, hypomagnesemia  -likely due to 10-17min seizure vs infectious P Lactic trending down,  remains normo to hypertensive kphos 20 mmol now Mag 2 gm now Trend BMP / Mag/ Phos/ urinary output Replace electrolytes as indicated  E. Coli bacteremia GNR in urine  - chronic foley from SNF P abx narrowed to ceftriaxone Follow trach asp/ UC  Trend CBC, temperature- remains afebrile, improving leukocytosis   Hyperglycemia- resolved P SSI q4hr    Medication non-compliance P Question if this patient non-compliance vs nursing facility, w/ his history of dementia, question if patient is has the comprehension to refuse CSW consulted 6/17  Best practice:  Diet: start TF Pain/Anxiety/Delirium protocol (if indicated): propofol gtt, PRN fent VAP protocol (if indicated): yes DVT prophylaxis: SCD GI prophylaxis: protonix Glucose control: SSI  Mobility: BR, seizure precautions  Code Status: Full  Family Communication: will update wife, Mardy Finkle Disposition: ICU  Labs   CBC: Recent Labs  Lab 06/25/18 0639 06/25/18 0759 06/26/18 0419  WBC 23.7*  --  14.0*  NEUTROABS 21.7*  --   --   HGB 14.5 13.9 12.2*  HCT 44.6 41.0 35.4*  MCV 98.0  --  94.1  PLT 433*  --  287    Basic Metabolic Panel: Recent Labs  Lab 06/25/18 0639 06/25/18 0759 06/25/18 1047 06/26/18 0419  NA 139 140  --  138  K 3.7 3.6  --  3.3*  CL 104  --   --  107  CO2 12*  --   --  22  GLUCOSE 148*  --   --  89  BUN 13  --   --  6*  CREATININE 1.16  --   --  0.55*  CALCIUM 9.6  --   --  8.5*  MG  --   --  2.1 1.7  PHOS  --   --  2.1* 2.0*   GFR: Estimated Creatinine Clearance: 78.6 mL/min (A) (by C-G formula based on SCr of 0.55 mg/dL (L)). Recent Labs  Lab 06/25/18 0639 06/25/18 0830 06/25/18 1047 06/26/18 0419  PROCALCITON  --   --  2.83  --   WBC 23.7*  --   --  14.0*  LATICACIDVEN >11.0* 5.3*  --   --     Liver Function Tests: Recent Labs  Lab 06/25/18 0639  AST 37  ALT 26  ALKPHOS 87  BILITOT 0.4  PROT 7.7  ALBUMIN 3.6   No results for input(s): LIPASE, AMYLASE in the last 168 hours. No results for input(s): AMMONIA in the last 168 hours.  ABG    Component Value Date/Time   PHART 7.311 (L) 06/25/2018 0759   PCO2ART 43.2 06/25/2018 0759   PO2ART 467.0 (H) 06/25/2018 0759   HCO3 21.6 06/25/2018 0759   TCO2 23 06/25/2018 0759   ACIDBASEDEF 4.0 (H) 06/25/2018 0759   O2SAT 100.0 06/25/2018 0759     Coagulation  Profile: No results for input(s): INR, PROTIME in the last 168 hours.  Cardiac Enzymes: Recent Labs  Lab 06/25/18 0653  TROPONINI 0.07*    HbA1C: Hgb A1c MFr Bld  Date/Time Value Ref Range Status  11/07/2013 06:24 AM 6.1 (H) <5.7 % Final    Comment:    (NOTE)  According to the ADA Clinical Practice Recommendations for 2011, when HbA1c is used as a screening test:  >=6.5%   Diagnostic of Diabetes Mellitus           (if abnormal result is confirmed) 5.7-6.4%   Increased risk of developing Diabetes Mellitus References:Diagnosis and Classification of Diabetes Mellitus,Diabetes Care,2011,34(Suppl 1):S62-S69 and Standards of Medical Care in         Diabetes - 2011,Diabetes Care,2011,34 (Suppl 1):S11-S61.  09/12/2013 01:35 PM 6.0 (H) <5.7 % Final    Comment:    (NOTE)                                                                       According to the ADA Clinical Practice Recommendations for 2011, when HbA1c is used as a screening test:  >=6.5%   Diagnostic of Diabetes Mellitus           (if abnormal result is confirmed) 5.7-6.4%   Increased risk of developing Diabetes Mellitus References:Diagnosis and Classification of Diabetes Mellitus,Diabetes Care,2011,34(Suppl 1):S62-S69 and Standards of Medical Care in         Diabetes - 2011,Diabetes Care,2011,34 (Suppl 1):S11-S61.    CBG: Recent Labs  Lab 06/25/18 0625 06/25/18 2012 06/26/18 0045 06/26/18 0423 06/26/18 0818  GLUCAP 144* 86 92 85 73   CCT 35 mins  Posey Boyer, MSN, AGACNP-BC [facility] Pgr: [phone] or if no answer [phone] 06/26/2018, 10:20 AM

## 2018-06-26 NOTE — Progress Notes (Signed)
Initial Nutrition Assessment  DOCUMENTATION CODES:   Not applicable  INTERVENTION:   Tube feeding:  -Jevity 1.2 @ 35 ml/hr via OGT -60 ml Prostat BID  Provides: 1408 kcals (1566 kcal with propofol), 107 grams protein, 679 ml free water. Meets 102% kcal needs and 100% of protein needs.   NUTRITION DIAGNOSIS:   Inadequate oral intake related to inability to eat as evidenced by NPO status.  GOAL:   Provide needs based on ASPEN/SCCM guidelines  MONITOR:   Diet advancement, Vent status, Skin, TF tolerance, Weight trends, Labs, I & O's  REASON FOR ASSESSMENT:   Consult Enteral/tube feeding initiation and management  ASSESSMENT:   Patient with PMH significant for CVA, COPD, ETOH abuse, PVD, dementia, seizure disorder, and HTN. Presents this admission s/p witnessed seizure.   RD working remotely.  Spoke with RN via phone. Propofol turned down. Pt not requiring pressors. Jevity 1.2 started @ 50 ml/hr this am. RD to adjust rate to better meet needs.   Weight noted to increase from 63.1 kg on 3/6 to 66.5 kg this admission. Will need to obtain weight/nutrition history if possible.   Patient is currently intubated on ventilator support MV: 8.6 L/min Temp (24hrs), Avg:98.4 F (36.9 C), Min:97.3 F (36.3 C), Max:100 F (37.8 C)  Propofol: 6 ml/hr- provides 158 kcal daily  I/O: +3,678 ml since admit UOP: 650 ml x 24 hrs   Drips: LR @ 50 ml/hr, propofol  Medications: SS novolog Labs: K 3.3 (L) Phosphorus 2.0 (L)   Diet Order:   Diet Order            Diet NPO time specified  Diet effective now              EDUCATION NEEDS:   Not appropriate for education at this time  Skin:  Skin Assessment: Reviewed RN Assessment  Last BM:  PTA  Height:   Ht Readings from Last 1 Encounters:  06/25/18 5\' 6"  (1.676 m)    Weight:   Wt Readings from Last 1 Encounters:  06/25/18 66.5 kg    Ideal Body Weight:  64.5 kg  BMI:  Body mass index is 23.66  kg/m.  Estimated Nutritional Needs:   Kcal:  1535 kcal  Protein:  100-120 grams  Fluid:  >/= 1.5 L/day   Mariana Single RD, LDN Clinical Nutrition Pager # - [phone]

## 2018-06-26 NOTE — Progress Notes (Signed)
Patient ET tube pulled back 2 cm per MD order. Patient tolerated well and is stable.

## 2018-06-26 NOTE — Progress Notes (Signed)
Patient's wife, Lee [name], updated by phone.  All questions answered.    Kennieth Rad, MSN, AGACNP-BC [facility] Pgr: [phone] or if no answer [phone] 06/26/2018, 1:04 PM

## 2018-06-27 DIAGNOSIS — R569 Unspecified convulsions: Secondary | ICD-10-CM

## 2018-06-27 LAB — MAGNESIUM
Magnesium: 1.8 mg/dL (ref 1.7–2.4)
Magnesium: 2.2 mg/dL (ref 1.7–2.4)

## 2018-06-27 LAB — GLUCOSE, CAPILLARY
Glucose-Capillary: 88 mg/dL (ref 70–99)
Glucose-Capillary: 108 mg/dL — ABNORMAL HIGH (ref 70–99)
Glucose-Capillary: 103 mg/dL — ABNORMAL HIGH (ref 70–99)
Glucose-Capillary: 115 mg/dL — ABNORMAL HIGH (ref 70–99)
Glucose-Capillary: 113 mg/dL — ABNORMAL HIGH (ref 70–99)

## 2018-06-27 LAB — BASIC METABOLIC PANEL
Anion gap: 11 (ref 5–15)
BUN: 7 mg/dL — ABNORMAL LOW (ref 8–23)
CO2: 24 mmol/L (ref 22–32)
Calcium: 8.6 mg/dL — ABNORMAL LOW (ref 8.9–10.3)
Chloride: 105 mmol/L (ref 98–111)
Creatinine, Ser: 0.66 mg/dL (ref 0.61–1.24)
GFR calc Af Amer: >60 mL/min (ref >60)
GFR calc non Af Amer: >60 mL/min (ref >60)
Glucose, Bld: 95 mg/dL (ref 70–99)
Potassium: 3.7 mmol/L (ref 3.5–5.1)
Sodium: 140 mmol/L (ref 135–145)

## 2018-06-27 LAB — MRSA PCR SCREENING: MRSA by PCR: POSITIVE — AB

## 2018-06-27 LAB — CBC
HCT: 36.2 % — ABNORMAL LOW (ref 39.0–52.0)
Hemoglobin: 12.3 g/dL — ABNORMAL LOW (ref 13.0–17.0)
MCH: 31.9 pg (ref 26.0–34.0)
MCHC: 34 g/dL (ref 30.0–36.0)
MCV: 93.8 fL (ref 80.0–100.0)
Platelets: 299 10*3/uL (ref 150–400)
RBC: 3.86 MIL/uL — ABNORMAL LOW (ref 4.22–5.81)
RDW: 15.6 % — ABNORMAL HIGH (ref 11.5–15.5)
WBC: 11.5 10*3/uL — ABNORMAL HIGH (ref 4.0–10.5)
nRBC: 0 % (ref 0.0–0.2)

## 2018-06-27 LAB — PHOSPHORUS
Phosphorus: 3 mg/dL (ref 2.5–4.6)
Phosphorus: 3.6 mg/dL (ref 2.5–4.6)

## 2018-06-27 LAB — BLOOD CULTURE ID PANEL (REFLEXED)

## 2018-06-27 LAB — CULTURE, BLOOD (ROUTINE X 2): Special Requests: ADEQUATE

## 2018-06-27 LAB — URINE CULTURE: Culture: 40000 — AB

## 2018-06-27 MED ORDER — SODIUM CHLORIDE 0.9 % IV SOLN
1.0000 g | Freq: Three times a day (TID) | INTRAVENOUS | Status: DC
  Filled 2018-06-27 (×2): qty 1

## 2018-06-27 MED ORDER — MUPIROCIN 2 % EX OINT
1.0000 "application " | TOPICAL_OINTMENT | Freq: Two times a day (BID) | CUTANEOUS | Status: AC
  Administered 2018-06-27 – 2018-07-01 (×10): 1 via NASAL
  Filled 2018-06-27: qty 22

## 2018-06-27 MED ORDER — PIPERACILLIN-TAZOBACTAM 3.375 G IVPB
3.3750 g | Freq: Three times a day (TID) | INTRAVENOUS | Status: DC
  Filled 2018-06-27: qty 50

## 2018-06-27 MED ORDER — CHLORHEXIDINE GLUCONATE CLOTH 2 % EX PADS
6.0000 | MEDICATED_PAD | Freq: Every day | CUTANEOUS | Status: AC
  Administered 2018-06-27 – 2018-06-30 (×4): 6 via TOPICAL

## 2018-06-27 MED ORDER — POTASSIUM CHLORIDE 20 MEQ/15ML (10%) PO SOLN
40.0000 meq | Freq: Once | ORAL | Status: AC
  Administered 2018-06-27: 40 meq
  Filled 2018-06-27: qty 30

## 2018-06-27 MED ORDER — CHLORHEXIDINE GLUCONATE CLOTH 2 % EX PADS
6.0000 | MEDICATED_PAD | Freq: Every day | CUTANEOUS | Status: DC
  Administered 2018-06-27 – 2018-07-06 (×9): 6 via TOPICAL

## 2018-06-27 MED ORDER — SODIUM CHLORIDE 0.9 % IV SOLN
1.0000 g | Freq: Three times a day (TID) | INTRAVENOUS | Status: AC
  Administered 2018-06-27 – 2018-07-05 (×26): 1 g via INTRAVENOUS
  Filled 2018-06-27 (×27): qty 1

## 2018-06-27 MED ORDER — LEVETIRACETAM 100 MG/ML PO SOLN
1500.0000 mg | Freq: Two times a day (BID) | ORAL | Status: DC
  Administered 2018-06-28 – 2018-06-30 (×5): 1500 mg
  Filled 2018-06-27 (×7): qty 15

## 2018-06-27 MED ORDER — MAGNESIUM SULFATE 2 GM/50ML IV SOLN
2.0000 g | Freq: Once | INTRAVENOUS | Status: AC
  Administered 2018-06-27: 2 g via INTRAVENOUS
  Filled 2018-06-27: qty 50

## 2018-06-27 NOTE — Progress Notes (Signed)
vLTM EEG complete. No skin breakdown 

## 2018-06-27 NOTE — Progress Notes (Addendum)
NAME:  Lee [name], MRN:  [id number], DOB:  February 27, 1950, LOS: 2 ADMISSION DATE:  06/25/2018, CONSULTATION DATE:  06/25/18  REFERRING MD: Rancour - EM  , CHIEF COMPLAINT:  Seizure disorder, acute respiratory failure    Brief History   68 yo M w/ dementia, seizures, presents 6/17 following witnessed 10-15 minute GTC seizure from nursing home.  Intubated for airway protection.     Past Medical History  CVA Dementia Seizure disorder HTN  Significant Hospital Events   6/17 admitted  Consults:  Neurology   Procedures:  EEG 6/17 >>> Slowing and few left temporoparietal epileptiform discharges.  No evidence of ongoing seizures. LTM 6/18 >> no evidence of SE, intermittent epileptiform discharges in the right anterior temporal region suggesting risk of focal epileptognenic city, otherwise generalized polymorphic delta activity suggesting severe encephalopathy but medication effect can not be excluded.  LTM 6/19 >>  Significant Diagnostic Tests:  CT Head non-con 6/17 > stable white matter changes and atrophy from prior. No evidence of acute infarction, hemorrhage or mass lesion. Remote appearing lacunar infarct L posterior inferior cerebellum   CXR 6/17> no focal opacities. Emphysemous appearing changes. ETT in satisfactory position   Micro Data:  6/17 SARS Cov2 - not detected 6/17 BCx >> e.coli >> multi-resistant (including ceftriaxone) 6/17 UCx >> 40K colonies GNR >> klebsiella  6/17 Tracheal aspirate>>   Antimicrobials:  Vanc 6/17  Cefepime 6/17 Flagyl 6/17 6/17 ceftriaxone >>6/19 6/19 meropenum >>  Interim history/subjective:  Remains on LTM  Currently on propofol 15 mcg/kg/min tmax 98.5  Objective   Blood pressure (!) 158/90, pulse 98, temperature 98.5 F (36.9 C), temperature source Axillary, resp. rate 18, height 5\' 6"  (1.676 m), weight 58.3 kg, SpO2 100 %.    Vent Mode: PRVC FiO2 (%):  [40 %] 40 % Set Rate:  [18 bmp] 18 bmp Vt Set:  [510 mL] 510 mL PEEP:  [5  cmH20] 5 cmH20 Plateau Pressure:  [9 cmH20-16 cmH20] 14 cmH20   Intake/Output Summary (Last 24 hours) at 06/27/2018 4403 Last data filed at 06/27/2018 0600 Gross per 24 hour  Intake 2471.75 ml  Output 1650 ml  Net 821.75 ml   Filed Weights   06/25/18 0634 06/27/18 0442  Weight: 66.5 kg 58.3 kg   Examination: General:  Chronically ill appearing frail older male in NAD on MV HEENT: MM pink/moist, ETT at 24, OGT, pupils 4/reactive- no gaze/ nystagmus Neuro:  Attempts to open eyes to noxious stimuli but otherwise no response  CV: rr, no murmur PULM: even/non-labored on MV, not breathing over set rate, lungs bilaterally clear, I personally tried SBT and patient was completely apneic GI: soft, ND, +bs  Extremities: warm/dry, trace LE edema  Skin: no rashes  Resolved Hospital Problem list    Assessment & Plan:   Seizure Acute encephalopathy ? Related to seizure vs infection/ toxic/ metabolic -history of non-compliance with AEDs/ resides in nursing home -home AEDs- Vimpat 300mg  BID, depakote 375 mg DR TID, Keppra 1500 mg BID, carbamazepine 400 mg BID  -CT Head non-con reveals no acute abnormality (06/25/18)  P Appreciate Neurology recs Ongoing LTM- ?if will stop today  Seizure precautions AED per neurology- tegretol, vimpat, keppra, valproate,  See below  Acute respiratory failure requiring mechanical ventilation - in setting of seizure P Full MV support, PRVC 8 cc/kg PEEP/FiO2 for SpO2 > 92% VAP bundle Daily SBT Trend CXR  Stopping propofol to allow WUA, has not received any prn fentanyl, last dose ativan 6/18, if wakes up,  repeat SBT and evaluate for extubation  Lactic Acidosis- resolved Hypokalemia, hypophos, hypomagnesemia  -likely due to 10-65min seizure vs infectious P Pending AM labs  Trend BMP / Mag/ Phos/ urinary output Replace electrolytes as indicated  E. Coli bacteremia GNR in urine  - chronic foley from SNF P Sensitives on BC back showing multiple  resistance/  ESBL, therefore will change from ceftriaxone to meropenum per pharmacy  Trend CBC/ fever curve   Hyperglycemia- resolved P CBG q 4 SSI sensitive  Medication non-compliance P Question if this patient non-compliance vs nursing facility, w/ his history of dementia, question if patient is has the comprehension to refuse CSW consulted 6/17  Best practice:  Diet: TF at goal per dietitian  Pain/Anxiety/Delirium protocol (if indicated): holding currently, prn propofol/ fentanyl VAP protocol (if indicated): yes DVT prophylaxis: SCD GI prophylaxis: protonix Glucose control: SSI  Mobility: BR, seizure precautions  Code Status: Full  Family Communication: will update wife, Tiran Kochel Disposition: ICU  Labs   CBC: Recent Labs  Lab 06/25/18 0639 06/25/18 0759 06/26/18 0419  WBC 23.7*  --  14.0*  NEUTROABS 21.7*  --   --   HGB 14.5 13.9 12.2*  HCT 44.6 41.0 35.4*  MCV 98.0  --  94.1  PLT 433*  --  287    Basic Metabolic Panel: Recent Labs  Lab 06/25/18 0639 06/25/18 0759 06/25/18 1047 06/26/18 0419 06/26/18 1140 06/26/18 1827 06/27/18 0341  NA 139 140  --  138  --   --   --   K 3.7 3.6  --  3.3*  --   --   --   CL 104  --   --  107  --   --   --   CO2 12*  --   --  22  --   --   --   GLUCOSE 148*  --   --  89  --   --   --   BUN 13  --   --  6*  --   --   --   CREATININE 1.16  --   --  0.55*  --   --   --   CALCIUM 9.6  --   --  8.5*  --   --   --   MG  --   --  2.1 1.7 1.8 2.1 1.8  PHOS  --   --  2.1* 2.0* 2.0* 2.9 3.0   GFR: Estimated Creatinine Clearance: 71.9 mL/min (A) (by C-G formula based on SCr of 0.55 mg/dL (L)). Recent Labs  Lab 06/25/18 0639 06/25/18 0830 06/25/18 1047 06/26/18 0419  PROCALCITON  --   --  2.83  --   WBC 23.7*  --   --  14.0*  LATICACIDVEN >11.0* 5.3*  --   --     Liver Function Tests: Recent Labs  Lab 06/25/18 0639  AST 37  ALT 26  ALKPHOS 87  BILITOT 0.4  PROT 7.7  ALBUMIN 3.6   No results for  input(s): LIPASE, AMYLASE in the last 168 hours. No results for input(s): AMMONIA in the last 168 hours.  ABG    Component Value Date/Time   PHART 7.311 (L) 06/25/2018 0759   PCO2ART 43.2 06/25/2018 0759   PO2ART 467.0 (H) 06/25/2018 0759   HCO3 21.6 06/25/2018 0759   TCO2 23 06/25/2018 0759   ACIDBASEDEF 4.0 (H) 06/25/2018 0759   O2SAT 100.0 06/25/2018 0759     Coagulation Profile: No results for input(s):  INR, PROTIME in the last 168 hours.  Cardiac Enzymes: Recent Labs  Lab 06/25/18 0653  TROPONINI 0.07*    HbA1C: Hgb A1c MFr Bld  Date/Time Value Ref Range Status  11/07/2013 06:24 AM 6.1 (H) <5.7 % Final    Comment:    (NOTE)                                                                       According to the ADA Clinical Practice Recommendations for 2011, when HbA1c is used as a screening test:  >=6.5%   Diagnostic of Diabetes Mellitus           (if abnormal result is confirmed) 5.7-6.4%   Increased risk of developing Diabetes Mellitus References:Diagnosis and Classification of Diabetes Mellitus,Diabetes Care,2011,34(Suppl 1):S62-S69 and Standards of Medical Care in         Diabetes - 2011,Diabetes Care,2011,34 (Suppl 1):S11-S61.  09/12/2013 01:35 PM 6.0 (H) <5.7 % Final    Comment:    (NOTE)                                                                       According to the ADA Clinical Practice Recommendations for 2011, when HbA1c is used as a screening test:  >=6.5%   Diagnostic of Diabetes Mellitus           (if abnormal result is confirmed) 5.7-6.4%   Increased risk of developing Diabetes Mellitus References:Diagnosis and Classification of Diabetes Mellitus,Diabetes Care,2011,34(Suppl 1):S62-S69 and Standards of Medical Care in         Diabetes - 2011,Diabetes Care,2011,34 (Suppl 1):S11-S61.    CBG: Recent Labs  Lab 06/26/18 1621 06/26/18 2018 06/26/18 2325 06/27/18 0356 06/27/18 0740  GLUCAP 83 81 93 88 108*   CCT 35 mins  Posey Boyer, MSN, AGACNP-BC [facility] Pgr: [phone] or if no answer [phone] 06/27/2018, 9:27 AM

## 2018-06-27 NOTE — Progress Notes (Signed)
Neurology Progress Note   S:// EEG overnight with no seizures. Continued left sided epileptogenicity, which has been present on many prior EEGs Blood c/s +ve for ESBL   O:// Current vital signs: BP (!) 157/91   Pulse 98   Temp 98.5 F (36.9 C) (Axillary)   Resp 18   Ht '5\' 6"'$  (1.676 m)   Wt 58.3 kg   SpO2 100%   BMI 20.74 kg/m  Vital signs in last 24 hours: Temp:  [97.6 F (36.4 C)-98.5 F (36.9 C)] 98.5 F (36.9 C) (06/19 0745) Pulse Rate:  [78-98] 98 (06/19 0745) Resp:  [17-19] 18 (06/19 1000) BP: (126-172)/(69-97) 157/91 (06/19 1000) SpO2:  [100 %] 100 % (06/19 1000) FiO2 (%):  [40 %] 40 % (06/19 0800) Weight:  [58.3 kg] 58.3 kg (06/19 0442) General: Intubated sedated and sedation was held this morning  HEENT: Normocephalic atraumatic CVS: Regular rate rhythm Respiratory: Vented Neurological exam Not on any sedation at the time of exam.  Vented. No spontaneous movement but there is subtle tremulousness all over his body. He does not open eyes to noxious stimulation and does not follow commands. Cranial nerves: Pupils equal round reactive light, no gaze preference or deviation although he attempts to keep his eyelids closed forcefully, face symmetry difficult to ascertain due to. He has positive corneals, positive gag, positive cough and although addressed was not breathing over the vent on examination and after stimulation, he did breathe over the vent. Motor exam: Slightly more than withdrawal to noxious stimulation especially in the upper extremities today with attempt to localize.  Lower extremities mild withdrawal. Sensory exam: As above Coordination cannot be assessed    Medications  Current Facility-Administered Medications:  .  acetaminophen (TYLENOL) tablet 650 mg, 650 mg, Per Tube, Q4H PRN, Chesley Mires, MD .  carbamazepine (TEGRETOL) tablet 400 mg, 400 mg, Per Tube, BID, Bowser, Laurel Dimmer, NP, 400 mg at 06/27/18 0955 .  chlorhexidine gluconate (MEDLINE  KIT) (PERIDEX) 0.12 % solution 15 mL, 15 mL, Mouth Rinse, BID, Halford Chessman, Vineet, MD, 15 mL at 06/27/18 0817 .  Chlorhexidine Gluconate Cloth 2 % PADS 6 each, 6 each, Topical, Q0600, Chesley Mires, MD .  Chlorhexidine Gluconate Cloth 2 % PADS 6 each, 6 each, Topical, Daily, Brand Males, MD, 6 each at 06/27/18 0955 .  clopidogrel (PLAVIX) tablet 75 mg, 75 mg, Per Tube, Daily, Chesley Mires, MD, 75 mg at 06/27/18 0954 .  enoxaparin (LOVENOX) injection 40 mg, 40 mg, Subcutaneous, Q24H, Sood, Vineet, MD, 40 mg at 06/26/18 1357 .  feeding supplement (JEVITY 1.2 CAL) liquid 1,000 mL, 1,000 mL, Per Tube, Continuous, Ramaswamy, Murali, MD .  feeding supplement (PRO-STAT SUGAR FREE 64) liquid 60 mL, 60 mL, Per Tube, BID, Brand Males, MD, 60 mL at 06/27/18 0954 .  fentaNYL (SUBLIMAZE) injection 50 mcg, 50 mcg, Intravenous, Q2H PRN, Rancour, Stephen, MD .  insulin aspart (novoLOG) injection 0-15 Units, 0-15 Units, Subcutaneous, Q4H, Bowser, Grace E, NP .  lacosamide (VIMPAT) tablet 300 mg, 300 mg, Per Tube, BID, Bowser, Laurel Dimmer, NP, 300 mg at 06/27/18 0953 .  lactated ringers infusion, , Intravenous, Continuous, Bowser, Grace E, NP, Last Rate: 50 mL/hr at 06/27/18 0600 .  levETIRAcetam (KEPPRA) 100 MG/ML solution 1,500 mg, 1,500 mg, Per Tube, BID, Bowser, Laurel Dimmer, NP, 1,500 mg at 06/27/18 0610 .  LORazepam (ATIVAN) injection 1-2 mg, 1-2 mg, Intravenous, Q2H PRN, Bowser, Grace E, NP, 2 mg at 06/27/18 1008 .  MEDLINE mouth rinse, 15 mL, Mouth Rinse,  10 times per day, Chesley Mires, MD, 15 mL at 06/27/18 1028 .  meropenem (MERREM) 1 g in sodium chloride 0.9 % 100 mL IVPB, 1 g, Intravenous, Q8H, Einar Grad, RPH, Last Rate: 200 mL/hr at 06/27/18 1017, 1 g at 06/27/18 1017 .  ondansetron (ZOFRAN) injection 4 mg, 4 mg, Intravenous, Q6H PRN, Bowser, Grace E, NP .  pantoprazole (PROTONIX) injection 40 mg, 40 mg, Intravenous, QHS, Bowser, Grace E, NP, 40 mg at 06/26/18 2100 .  propofol (DIPRIVAN) 1000  MG/100ML infusion, 0-50 mcg/kg/min, Intravenous, Continuous, Rancour, Stephen, MD, Stopped at 06/27/18 0940 .  sodium chloride flush (NS) 0.9 % injection 10-40 mL, 10-40 mL, Intracatheter, Q12H, Sood, Vineet, MD, 10 mL at 06/27/18 1014 .  sodium chloride flush (NS) 0.9 % injection 10-40 mL, 10-40 mL, Intracatheter, PRN, Chesley Mires, MD .  valproate (DEPACON) 375 mg in dextrose 5 % 50 mL IVPB, 375 mg, Intravenous, Q8H, Bowser, Grace E, NP, Last Rate: 53.8 mL/hr at 06/27/18 0612, 375 mg at 06/27/18 0612 Labs CBC    Component Value Date/Time   WBC 14.0 (H) 06/26/2018 0419   RBC 3.76 (L) 06/26/2018 0419   HGB 12.2 (L) 06/26/2018 0419   HGB 14.9 07/31/2017 1503   HCT 35.4 (L) 06/26/2018 0419   HCT 44.6 07/31/2017 1503   PLT 287 06/26/2018 0419   PLT 383 07/31/2017 1503   MCV 94.1 06/26/2018 0419   MCV 96 07/31/2017 1503   MCH 32.4 06/26/2018 0419   MCHC 34.5 06/26/2018 0419   RDW 15.3 06/26/2018 0419   RDW 14.5 07/31/2017 1503   LYMPHSABS 0.4 (L) 06/25/2018 0639   LYMPHSABS 2.5 07/31/2017 1503   MONOABS 1.3 (H) 06/25/2018 0639   EOSABS 0.0 06/25/2018 0639   EOSABS 0.2 07/31/2017 1503   BASOSABS 0.1 06/25/2018 0639   BASOSABS 0.0 07/31/2017 1503    CMP     Component Value Date/Time   NA 138 06/26/2018 0419   NA 145 (H) 07/31/2017 1503   K 3.3 (L) 06/26/2018 0419   CL 107 06/26/2018 0419   CO2 22 06/26/2018 0419   GLUCOSE 89 06/26/2018 0419   BUN 6 (L) 06/26/2018 0419   BUN 9 07/31/2017 1503   CREATININE 0.55 (L) 06/26/2018 0419   CALCIUM 8.5 (L) 06/26/2018 0419   PROT 7.7 06/25/2018 0639   PROT 7.3 07/31/2017 1503   ALBUMIN 3.6 06/25/2018 0639   ALBUMIN 4.4 07/31/2017 1503   AST 37 06/25/2018 0639   ALT 26 06/25/2018 0639   ALKPHOS 87 06/25/2018 0639   BILITOT 0.4 06/25/2018 0639   BILITOT <0.2 07/31/2017 1503   GFRNONAA >60 06/26/2018 0419   GFRAA >60 06/26/2018 0419    Imaging I have reviewed images in epic and the results pertinent to this consultation are: CT  head negative for acute process.  Assessment: 68 year old man with history of strokes, seizures, resident of the memory care facility with reported noncompliance to medications as he was refusing it and his wife could not come in because of COVID restrictions to given medications, hypertension, brought in with breakthrough seizures. Likely resultant of noncompliance as well as blood cultures showing bacteremia which might have lowered his seizure threshold. Long-term EEG for 2 days only shows left temporal epileptiform discharges which are present on his prior EEGs as well and are probably his baseline. No ongoing seizure activity on EEG. I think his dense encephalopathy is related to his bacteremia and sepsis.  Impression: Breakthrough seizures in the setting of toxic metabolic derangement/bacteremia  Recommendations: MRI brain w/o when able. D/C LTM C/W AED Reduce and/or DC sedation Treat bacteremia per PCCM Discussed with Kennieth Rad, NP from the primary team. We will follow  -- Amie Portland, MD [facility] Pager: [phone] If 7pm to 7am, please call on call as listed on AMION.  CRITICAL CARE ATTESTATION Performed by: Amie Portland, MD Total critical care time: 30 minutes Critical care time was exclusive of separately billable procedures and treating other patients and/or supervising APPs/Residents/Students Critical care was necessary to treat or prevent imminent or life-threatening deterioration due to multifactorial toxic metabolic encephalopathy, breakthrough seizures. This patient is critically ill and at significant risk for neurological worsening and/or death and care requires constant monitoring. Critical care was time spent personally by me on the following activities: development of treatment plan with patient and/or surrogate as well as nursing, discussions with consultants, evaluation of patient's response to treatment, examination of patient, obtaining  history from patient or surrogate, ordering and performing treatments and interventions, ordering and review of laboratory studies, ordering and review of radiographic studies, pulse oximetry, re-evaluation of patient's condition, participation in multidisciplinary rounds and medical decision making of high complexity in the care of this patient.

## 2018-06-27 NOTE — Procedures (Signed)
Continuous Video-EEG Monitoring Report   Study Duration:                     06/26/2018 09:30 to 06/27/2018 07:30 CPT Code:                              36629 Diagnosis and Code:            Status epilepticus (G40.901)   History: This is a 68 year old male presenting with status epilepticus.  Continuous video-EEG monitoring was performed to evaluate for seizures.   Technical Details:  Long-term video-EEG monitoring was performed using standard setting per the guidelines.  Briefly, a minimum of 21 electrodes were placed on scalp according to the International 10-20 or/and 10-10 Systems.  Supplemental electrodes were placed as needed.  Single EKG electrode was also used to detect cardiac arrhythmia.  Patient's behavior was continuously recorded on video simultaneously with EEG.  A minimum of 16 channels were used for data display.  Each epoch of study was reviewed manually daily and as needed using standard referential and bipolar montages.     EEG Description:  There was generalized polymorphic delta slowing.  No posterior dominant rhythm or sleep architecture was recorded.  Diffuse beta activity was present, which was most likely due to medication effect (propofol).  Intermittent focal sharp waves were present in the right anterior temporal region.  No seizure was captured.  Event button was pushed at 10:30 and 15:34 on 06/26/2018 for unclear reason (no clinical or electrographic evidence of seizure was present).   Impression:  This is an abnormal EEG due to the presence of the following: 1) Generalized polymorphic delta slowing, suggesting severe encephalopathy, but medication effect (propofol) could not be excluded; 2) Intermittent epileptiform discharges in the right anterior temporal region, suggesting risk of focal epileptogenicity.  No seizure was captured.  Compared with the last epoch, current epoch showed no significant change.                        Reading Physician: Winfield Cunas, MD, PhD

## 2018-06-27 NOTE — Progress Notes (Signed)
PHARMACY - PHYSICIAN COMMUNICATION CRITICAL VALUE ALERT - BLOOD CULTURE IDENTIFICATION (BCID)  Lee [name] is an 68 y.o. male admitted with seizures and VDRF, found to have E. coli bacteremia in blood cultures drawn AM 6/17.    Assessment:   1/2 blood cultures drawn AM 6/17 growing GPC, nothing detected on BCID, likely contaminant  Name of physician (or Provider) Contacted: Dr. Oletta Darter  Current antibiotics: Rocephin  Changes to prescribed antibiotics recommended:   None--continue Rocephin  Results for orders placed or performed during the hospital encounter of 06/25/18  Blood Culture ID Panel (Reflexed) (Collected: 06/25/2018  6:33 PM)  Result Value Ref Range   Enterococcus species NOT DETECTED NOT DETECTED   Listeria monocytogenes NOT DETECTED NOT DETECTED   Staphylococcus species NOT DETECTED NOT DETECTED   Staphylococcus aureus (BCID) NOT DETECTED NOT DETECTED   Streptococcus species NOT DETECTED NOT DETECTED   Streptococcus agalactiae NOT DETECTED NOT DETECTED   Streptococcus pneumoniae NOT DETECTED NOT DETECTED   Streptococcus pyogenes NOT DETECTED NOT DETECTED   Acinetobacter baumannii NOT DETECTED NOT DETECTED   Enterobacteriaceae species NOT DETECTED NOT DETECTED   Enterobacter cloacae complex NOT DETECTED NOT DETECTED   Escherichia coli NOT DETECTED NOT DETECTED   Klebsiella oxytoca NOT DETECTED NOT DETECTED   Klebsiella pneumoniae NOT DETECTED NOT DETECTED   Proteus species NOT DETECTED NOT DETECTED   Serratia marcescens NOT DETECTED NOT DETECTED   Haemophilus influenzae NOT DETECTED NOT DETECTED   Neisseria meningitidis NOT DETECTED NOT DETECTED   Pseudomonas aeruginosa NOT DETECTED NOT DETECTED   Candida albicans NOT DETECTED NOT DETECTED   Candida glabrata NOT DETECTED NOT DETECTED   Candida krusei NOT DETECTED NOT DETECTED   Candida parapsilosis NOT DETECTED NOT DETECTED   Candida tropicalis NOT DETECTED NOT DETECTED    Caryl Pina 06/27/2018   1:27 AM

## 2018-06-27 NOTE — Progress Notes (Signed)
Patient's wife, Jeannene Patella updated by phone and plan of care.    Kennieth Rad, MSN, AGACNP-BC [facility] Pgr: [phone] or if no answer [phone] 06/27/2018, 3:02 PM

## 2018-06-28 ENCOUNTER — Inpatient Hospital Stay (HOSPITAL_COMMUNITY): Payer: Medicare Other

## 2018-06-28 DIAGNOSIS — J9601 Acute respiratory failure with hypoxia: Secondary | ICD-10-CM

## 2018-06-28 LAB — CBC
HCT: 35 % — ABNORMAL LOW (ref 39.0–52.0)
Hemoglobin: 12 g/dL — ABNORMAL LOW (ref 13.0–17.0)
MCH: 32.3 pg (ref 26.0–34.0)
MCHC: 34.3 g/dL (ref 30.0–36.0)
MCV: 94.3 fL (ref 80.0–100.0)
Platelets: 320 10*3/uL (ref 150–400)
RBC: 3.71 MIL/uL — ABNORMAL LOW (ref 4.22–5.81)
RDW: 15.8 % — ABNORMAL HIGH (ref 11.5–15.5)
WBC: 8.1 10*3/uL (ref 4.0–10.5)
nRBC: 0 % (ref 0.0–0.2)

## 2018-06-28 LAB — BASIC METABOLIC PANEL
Anion gap: 10 (ref 5–15)
BUN: 12 mg/dL (ref 8–23)
CO2: 21 mmol/L — ABNORMAL LOW (ref 22–32)
Calcium: 8.4 mg/dL — ABNORMAL LOW (ref 8.9–10.3)
Chloride: 107 mmol/L (ref 98–111)
Creatinine, Ser: 0.54 mg/dL — ABNORMAL LOW (ref 0.61–1.24)
GFR calc Af Amer: >60 mL/min (ref >60)
GFR calc non Af Amer: >60 mL/min (ref >60)
Glucose, Bld: 115 mg/dL — ABNORMAL HIGH (ref 70–99)
Potassium: 4.1 mmol/L (ref 3.5–5.1)
Sodium: 138 mmol/L (ref 135–145)

## 2018-06-28 LAB — GLUCOSE, CAPILLARY
Glucose-Capillary: 100 mg/dL — ABNORMAL HIGH (ref 70–99)
Glucose-Capillary: 100 mg/dL — ABNORMAL HIGH (ref 70–99)
Glucose-Capillary: 110 mg/dL — ABNORMAL HIGH (ref 70–99)
Glucose-Capillary: 110 mg/dL — ABNORMAL HIGH (ref 70–99)
Glucose-Capillary: 89 mg/dL (ref 70–99)
Glucose-Capillary: 93 mg/dL (ref 70–99)
Glucose-Capillary: 97 mg/dL (ref 70–99)

## 2018-06-28 LAB — ECHOCARDIOGRAM COMPLETE
Height: 66 in
Weight: 2091.72 oz

## 2018-06-28 LAB — CULTURE, BLOOD (ROUTINE X 2): Special Requests: ADEQUATE

## 2018-06-28 LAB — TRIGLYCERIDES: Triglycerides: 60 mg/dL (ref <150)

## 2018-06-28 MED ORDER — HYDRALAZINE HCL 20 MG/ML IJ SOLN
10.0000 mg | INTRAMUSCULAR | Status: DC | PRN
  Administered 2018-06-29: 10 mg via INTRAVENOUS
  Filled 2018-06-28: qty 1

## 2018-06-28 MED ORDER — CHLORHEXIDINE GLUCONATE 0.12 % MT SOLN
OROMUCOSAL | Status: AC
  Administered 2018-06-28: 15 mL via OROMUCOSAL
  Filled 2018-06-28: qty 15

## 2018-06-28 NOTE — Progress Notes (Signed)
  Echocardiogram 2D Echocardiogram has been performed.  Johny Chess 06/28/2018, 4:49 PM

## 2018-06-28 NOTE — Progress Notes (Signed)
[facility] Progress Note Patient Name: Lee [name] DOB: February 10, 1950 MRN: [id number]   Date of Service  06/28/2018  HPI/Events of Note  Hypertension - BP = 176/101.  eICU Interventions  Will order: 1. Hydralazine 10 mg IV Q 4 hours PRN SBP > 170 or DBP > 100.      Intervention Category Major Interventions: Hypertension - evaluation and management  Sommer,Steven Eugene 06/28/2018, 11:11 PM

## 2018-06-28 NOTE — Progress Notes (Signed)
PT taken to MRI.  Uneventful transport.  Pt back in 2H24 and stable.  RT will continue to monitor.

## 2018-06-28 NOTE — Progress Notes (Addendum)
Neurology Progress Note   S:// EEG down now. Abx tx underway for Blood c/s +ve for ESBL. Opens eyes briefly, but doesn't follow commands.    O:// Current vital signs: BP (!) 147/89   Pulse 97   Temp 99.4 F (37.4 C) (Oral)   Resp 16   Ht '5\' 6"'$  (1.676 m)   Wt 59.3 kg   SpO2 100%   BMI 21.10 kg/m  Vital signs in last 24 hours: Temp:  [97.3 F (36.3 C)-99.9 F (37.7 C)] 99.4 F (37.4 C) (06/20 0920) Pulse Rate:  [97-108] 97 (06/20 0837) Resp:  [16-22] 16 (06/20 0900) BP: (114-167)/(72-95) 147/89 (06/20 0900) SpO2:  [99 %-100 %] 100 % (06/20 0900) FiO2 (%):  [40 %] 40 % (06/20 0837) Weight:  [59.3 kg] 59.3 kg (06/20 0500) General: frail, elderly, critically ill/Intubated, propofol off during exam  HEENT: Normocephalic atraumatic CVS: Regular rate rhythm Respiratory: Vented Neurological exam Not on any sedation at the time of exam.  Vented.  Brief eye opening to stimulation and does not follow commands. Cranial nerves: Pupils equal round reactive light, no gaze preference, slight initial roving seem upon passive eye opening. No deviation although he attempts to keep his eyelids closed forcefully, face symmetry difficult to ascertain due to ETT. He has positive corneals, positive gag, positive cough, he did breathe over the vent. Motor exam: Briefly localized on right hand towards noxious stimulation.  Lower extremities mild withdrawal. Sensory exam: As above Coordination cannot be assessed    Medications  Current Facility-Administered Medications:  .  acetaminophen (TYLENOL) tablet 650 mg, 650 mg, Per Tube, Q4H PRN, Chesley Mires, MD .  carbamazepine (TEGRETOL) tablet 400 mg, 400 mg, Per Tube, BID, Bowser, Laurel Dimmer, NP, 400 mg at 06/28/18 0902 .  chlorhexidine gluconate (MEDLINE KIT) (PERIDEX) 0.12 % solution 15 mL, 15 mL, Mouth Rinse, BID, Halford Chessman, Vineet, MD, 15 mL at 06/28/18 0839 .  Chlorhexidine Gluconate Cloth 2 % PADS 6 each, 6 each, Topical, Q0600, Chesley Mires,  MD .  Chlorhexidine Gluconate Cloth 2 % PADS 6 each, 6 each, Topical, Daily, Brand Males, MD, 6 each at 06/28/18 [phone] .  Chlorhexidine Gluconate Cloth 2 % PADS 6 each, 6 each, Topical, Q0600, Brand Males, MD, 6 each at 06/28/18 0607 .  clopidogrel (PLAVIX) tablet 75 mg, 75 mg, Per Tube, Daily, Chesley Mires, MD, 75 mg at 06/28/18 0902 .  enoxaparin (LOVENOX) injection 40 mg, 40 mg, Subcutaneous, Q24H, Sood, Vineet, MD, 40 mg at 06/27/18 1235 .  feeding supplement (JEVITY 1.2 CAL) liquid 1,000 mL, 1,000 mL, Per Tube, Continuous, Ramaswamy, Murali, MD, Last Rate: 35 mL/hr at 06/27/18 1800, 1,000 mL at 06/27/18 1800 .  feeding supplement (PRO-STAT SUGAR FREE 64) liquid 60 mL, 60 mL, Per Tube, BID, Brand Males, MD, 60 mL at 06/28/18 0901 .  fentaNYL (SUBLIMAZE) injection 50 mcg, 50 mcg, Intravenous, Q2H PRN, Rancour, Stephen, MD .  insulin aspart (novoLOG) injection 0-15 Units, 0-15 Units, Subcutaneous, Q4H, Bowser, Grace E, NP .  lacosamide (VIMPAT) tablet 300 mg, 300 mg, Per Tube, BID, Bowser, Laurel Dimmer, NP, 300 mg at 06/28/18 0901 .  lactated ringers infusion, , Intravenous, Continuous, Jennelle Human B, NP, Last Rate: 10 mL/hr at 06/27/18 2000 .  levETIRAcetam (KEPPRA) 100 MG/ML solution 1,500 mg, 1,500 mg, Per Tube, BID, Brand Males, MD, 1,500 mg at 06/28/18 0852 .  LORazepam (ATIVAN) injection 1-2 mg, 1-2 mg, Intravenous, Q2H PRN, Bowser, Laurel Dimmer, NP, 2 mg at 06/27/18 2103 .  MEDLINE mouth rinse,  15 mL, Mouth Rinse, 10 times per day, Chesley Mires, MD, 15 mL at 06/28/18 0607 .  meropenem (MERREM) 1 g in sodium chloride 0.9 % 100 mL IVPB, 1 g, Intravenous, Q8H, Einar Grad, RPH, Last Rate: 200 mL/hr at 06/28/18 0607, 1 g at 06/28/18 8676 .  mupirocin ointment (BACTROBAN) 2 % 1 application, 1 application, Nasal, BID, Brand Males, MD, 1 application at 19/50/93 0900 .  ondansetron (ZOFRAN) injection 4 mg, 4 mg, Intravenous, Q6H PRN, Bowser, Grace E, NP .  pantoprazole  (PROTONIX) injection 40 mg, 40 mg, Intravenous, QHS, Bowser, Grace E, NP, 40 mg at 06/27/18 2226 .  propofol (DIPRIVAN) 1000 MG/100ML infusion, 0-50 mcg/kg/min, Intravenous, Continuous, Rancour, Stephen, MD, Stopped at 06/27/18 [phone] .  sodium chloride flush (NS) 0.9 % injection 10-40 mL, 10-40 mL, Intracatheter, Q12H, Chesley Mires, MD, 10 mL at 06/27/18 2235 .  sodium chloride flush (NS) 0.9 % injection 10-40 mL, 10-40 mL, Intracatheter, PRN, Chesley Mires, MD .  valproate (DEPACON) 375 mg in dextrose 5 % 50 mL IVPB, 375 mg, Intravenous, Q8H, Bowser, Grace E, NP, Last Rate: 53.8 mL/hr at 06/28/18 0606, 375 mg at 06/28/18 0606 Labs CBC    Component Value Date/Time   WBC 8.1 06/28/2018 0537   RBC 3.71 (L) 06/28/2018 0537   HGB 12.0 (L) 06/28/2018 0537   HGB 14.9 07/31/2017 1503   HCT 35.0 (L) 06/28/2018 0537   HCT 44.6 07/31/2017 1503   PLT 320 06/28/2018 0537   PLT 383 07/31/2017 1503   MCV 94.3 06/28/2018 0537   MCV 96 07/31/2017 1503   MCH 32.3 06/28/2018 0537   MCHC 34.3 06/28/2018 0537   RDW 15.8 (H) 06/28/2018 0537   RDW 14.5 07/31/2017 1503   LYMPHSABS 0.4 (L) 06/25/2018 0639   LYMPHSABS 2.5 07/31/2017 1503   MONOABS 1.3 (H) 06/25/2018 0639   EOSABS 0.0 06/25/2018 0639   EOSABS 0.2 07/31/2017 1503   BASOSABS 0.1 06/25/2018 0639   BASOSABS 0.0 07/31/2017 1503    CMP     Component Value Date/Time   NA 138 06/28/2018 0537   NA 145 (H) 07/31/2017 1503   K 4.1 06/28/2018 0537   CL 107 06/28/2018 0537   CO2 21 (L) 06/28/2018 0537   GLUCOSE 115 (H) 06/28/2018 0537   BUN 12 06/28/2018 0537   BUN 9 07/31/2017 1503   CREATININE 0.54 (L) 06/28/2018 0537   CALCIUM 8.4 (L) 06/28/2018 0537   PROT 7.7 06/25/2018 0639   PROT 7.3 07/31/2017 1503   ALBUMIN 3.6 06/25/2018 0639   ALBUMIN 4.4 07/31/2017 1503   AST 37 06/25/2018 0639   ALT 26 06/25/2018 0639   ALKPHOS 87 06/25/2018 0639   BILITOT 0.4 06/25/2018 0639   BILITOT <0.2 07/31/2017 1503   GFRNONAA >60 06/28/2018 0537    GFRAA >60 06/28/2018 0537    Imaging I have reviewed images in epic and the results pertinent to this consultation are: CT head negative for acute process.  Assessment: 68 year old man with history of strokes, seizures, resident of the memory care facility with reported noncompliance to medications as he was refusing. Normally his wife is able to help with his compliance, but no unable to visit him at [facility] because of COVID restrictions. Thus, due to lack of maintance meds, he became ridgid from PD and brought in with breakthrough seizures. Blood cultures showing bacteremia which might have additionally lowered his seizure threshold.  Long-term EEG for 2 days only shows left temporal epileptiform discharges which are present on his  prior EEGs as well and are probably his baseline. No ongoing seizure activity on EEG, stopped on 6/19.  Impression:  Encephalopay- Dense encephalopathy is multifactorial, initially d/t post ictal state, poor neurologic baseline and now more related to his bacteremia and sepsis.  Breakthrough seizures- in the setting of toxic metabolic derangement/bacteremia and medication non-compliance.   Recommendations: MRI brain today Continue ASDs Limit sedation, wean vent and extubate as able Supportive care and treat all medical underlying issues Treat bacteremia per PCCM We will follow up on MRI  Desiree Metzger-Cihelka, ARNP-C, ANVP-BC Pager: [phone] [facility] If 7pm to 7am, please call on call as listed on [place]   Attending Neurohospitalist Addendum Patient seen and examined with APP/Resident. Agree with the history and physical as documented above. Agree with the plan as documented, which I helped formulate. I have independently reviewed the chart, obtained history, review of systems and examined the patient.I have personally reviewed pertinent head/neck/spine imaging (CT/MRI). Please feel free to call with any questions. --- Amie Portland,  MD [facility] Pager: [phone]  If 7pm to 7am, please call on call as listed on [facility].

## 2018-06-28 NOTE — Progress Notes (Signed)
MRI brain completed reviewed.  Punctate right corona radiata area of acute infarction-likely incidental. No evidence of anoxic injury or any large structural lesions to explain current presentation.  Impression Toxic metabolic encephalopathy in the setting of possible sepsis  Recommendations as in today's progress note  We will continue to follow with you.  -- Amie Portland, MD [facility] Pager: [phone] If 7pm to 7am, please call on call as listed on AMION.

## 2018-06-28 NOTE — Progress Notes (Signed)
NAME:  Lee [name], MRN:  [id number], DOB:  1950-11-30, LOS: 3 ADMISSION DATE:  06/25/2018, CONSULTATION DATE:  06/25/18  REFERRING MD: Rancour - EM  , CHIEF COMPLAINT:  Seizure disorder, acute respiratory failure    Brief History   68 yo M w/ dementia, seizures, presents 6/17 following witnessed 10-15 minute GTC seizure from nursing home.  Intubated for airway protection.     Past Medical History  CVA Dementia Seizure disorder HTN  Significant Hospital Events   6/17 admitted  6/19 - Remains on LTM  Currently on propofol 15 mcg/kg/min tmax 98.5  Consults:  Neurology   Procedures:  EEG 6/17 >>> Slowing and few left temporoparietal epileptiform discharges.  No evidence of ongoing seizures. LTM 6/18 >> no evidence of SE, intermittent epileptiform discharges in the right anterior temporal region suggesting risk of focal epileptognenic city, otherwise generalized polymorphic delta activity suggesting severe encephalopathy but medication effect can not be excluded.  LTM 6/19 >>  Significant Diagnostic Tests:  CT Head non-con 6/17 > stable white matter changes and atrophy from prior. No evidence of acute infarction, hemorrhage or mass lesion. Remote appearing lacunar infarct L posterior inferior cerebellum   CXR 6/17> no focal opacities. Emphysemous appearing changes. ETT in satisfactory position   Micro Data:  6/17 SARS Cov2 - not detected 6/17 BCx >> e.coli >> multi-resistant (including ceftriaxone) 6/17 UCx >> 40K colonies GNR >> klebsiella  6/17 Tracheal aspirate>>   Antimicrobials:  Vanc 6/17  Cefepime 6/17 Flagyl 6/17 6/17 ceftriaxone >>6/19 6/19 meropenum >>  Interim history/subjective:   6/20 - no change. Not all that responsive - plateaued compared to yesterday. Bood culture + ESBL. Opens eyes but no follow commands. Neuro recommending MRI brain today   Objective   Blood pressure 128/80, pulse 97, temperature 99.4 F (37.4 C), temperature source Oral,  resp. rate 18, height 5\' 6"  (1.676 m), weight 59.3 kg, SpO2 99 %.    Vent Mode: PRVC FiO2 (%):  [40 %] 40 % Set Rate:  [18 bmp] 18 bmp Vt Set:  [510 mL] 510 mL PEEP:  [5 cmH20] 5 cmH20 Plateau Pressure:  [9 cmH20-16 cmH20] 13 cmH20   Intake/Output Summary (Last 24 hours) at 06/28/2018 1232 Last data filed at 06/28/2018 0800 Gross per 24 hour  Intake 1103.82 ml  Output -  Net 1103.82 ml   Filed Weights   06/25/18 0634 06/27/18 0442 06/28/18 0500  Weight: 66.5 kg 58.3 kg 59.3 kg  General Appearance:  Looks criticall ill. Frail Head:  Normocephalic, without obvious abnormality, atraumatic Eyes:  PERRL - yes, conjunctiva/corneas - muddy     Ears:  Normal external ear canals, both ears Nose:  G tube - no Throat:  ETT TUBE - yes , OG tube - yes Neck:  Supple,  No enlargement/tenderness/nodules Lungs: Clear to auscultation bilaterally, Ventilator   Synchrony - yes Heart:  S1 and S2 normal, no murmur, CVP - yes.  Pressors - n Abdomen:  Soft, no masses, no organomegaly Genitalia / Rectal:  Not done Extremities:  Extremities- intact Skin:  ntact in exposed areas . Sacral area - not examined Neurologic:  Sedation - none -> RASS - -4 . Moves all 4s - x. CAM-ICU - x . Orientation - x. Opens eyes     LABS    PULMONARY Recent Labs  Lab 06/25/18 0759  PHART 7.311*  PCO2ART 43.2  PO2ART 467.0*  HCO3 21.6  TCO2 23  O2SAT 100.0    CBC Recent Labs  Lab  06/26/18 0419 06/27/18 1102 06/28/18 0537  HGB 12.2* 12.3* 12.0*  HCT 35.4* 36.2* 35.0*  WBC 14.0* 11.5* 8.1  PLT 287 299 320    COAGULATION No results for input(s): INR in the last 168 hours.  CARDIAC   Recent Labs  Lab 06/25/18 0653  TROPONINI 0.07*   No results for input(s): PROBNP in the last 168 hours.   CHEMISTRY Recent Labs  Lab 06/25/18 0639 06/25/18 0759  06/26/18 0419 06/26/18 1140 06/26/18 1827 06/27/18 0341 06/27/18 1102 06/27/18 1619 06/28/18 0537  NA 139 140  --  138  --   --   --  140  --   138  K 3.7 3.6  --  3.3*  --   --   --  3.7  --  4.1  CL 104  --   --  107  --   --   --  105  --  107  CO2 12*  --   --  22  --   --   --  24  --  21*  GLUCOSE 148*  --   --  89  --   --   --  95  --  115*  BUN 13  --   --  6*  --   --   --  7*  --  12  CREATININE 1.16  --   --  0.55*  --   --   --  0.66  --  0.54*  CALCIUM 9.6  --   --  8.5*  --   --   --  8.6*  --  8.4*  MG  --   --    < > 1.7 1.8 2.1 1.8  --  2.2  --   PHOS  --   --    < > 2.0* 2.0* 2.9 3.0  --  3.6  --    < > = values in this interval not displayed.   Estimated Creatinine Clearance: 73.1 mL/min (A) (by C-G formula based on SCr of 0.54 mg/dL (L)).   LIVER Recent Labs  Lab 06/25/18 0639  AST 37  ALT 26  ALKPHOS 87  BILITOT 0.4  PROT 7.7  ALBUMIN 3.6     INFECTIOUS Recent Labs  Lab 06/25/18 0639 06/25/18 0830 06/25/18 1047  LATICACIDVEN >11.0* 5.3*  --   PROCALCITON  --   --  2.83     ENDOCRINE CBG (last 3)  Recent Labs    06/28/18 0423 06/28/18 0916 06/28/18 1125  GLUCAP 100* 110* 97         IMAGING x48h  - image(s) personally visualized  -   highlighted in bold No results found.    Resolved Hospital Problem list    Assessment & Plan:   ASSESSMENT / PLAN:  PULMONARY A:  Acute resp failure due to status and acuet enceph  06/28/2018 -> does not meet sbt criteria  P:   Full vent supportt   NEUROLOGIC A:   Seizure Acute encephalopathy ? Related to seizure vs infection/ toxic/ metabolic -history of non-compliance with AEDs/ resides in nursing home P:   MRI 06/28/2018 Per neuro    VASCULAR A:   Maintains bp/hr  P:  MAP goal > 65  CARDIAC STRUCTURAL A: ECHO EF 65% in 2017  P: Repeat echo Check trop  CARDIAC ELECTRICAL A: sinus   P: monitor  INFECTIOUS A:   MRSA PCR + 06/27/2018 Coag neg stph blood 06/25/2018 E. Coli bacteremia 06/25/2018  Klebsiella urine 06/25/2018  - chronic foley from SNF P:   Anti-infectives (From admission, onward)    Start     Dose/Rate Route Frequency Ordered Stop   06/27/18 1400  meropenem (MERREM) 1 g in sodium chloride 0.9 % 100 mL IVPB  Status:  Discontinued     1 g 200 mL/hr over 30 Minutes Intravenous Every 8 hours 06/27/18 0948 06/27/18 0949   06/27/18 1000  meropenem (MERREM) 1 g in sodium chloride 0.9 % 100 mL IVPB     1 g 200 mL/hr over 30 Minutes Intravenous Every 8 hours 06/27/18 0949     06/27/18 0945  piperacillin-tazobactam (ZOSYN) IVPB 3.375 g  Status:  Discontinued     3.375 g 12.5 mL/hr over 240 Minutes Intravenous Every 8 hours 06/27/18 0943 06/27/18 0948   06/25/18 2200  cefTRIAXone (ROCEPHIN) 2 g in sodium chloride 0.9 % 100 mL IVPB  Status:  Discontinued     2 g 200 mL/hr over 30 Minutes Intravenous Every 24 hours 06/25/18 2108 06/27/18 0943   06/25/18 0645  aztreonam (AZACTAM) 2 g in sodium chloride 0.9 % 100 mL IVPB  Status:  Discontinued     2 g 200 mL/hr over 30 Minutes Intravenous  Once 06/25/18 0639 06/25/18 0641   06/25/18 0645  metroNIDAZOLE (FLAGYL) IVPB 500 mg     500 mg 100 mL/hr over 60 Minutes Intravenous  Once 06/25/18 0639 06/25/18 1034   06/25/18 0645  vancomycin (VANCOCIN) IVPB 1000 mg/200 mL premix  Status:  Discontinued     1,000 mg 200 mL/hr over 60 Minutes Intravenous  Once 06/25/18 0639 06/25/18 0641   06/25/18 0645  vancomycin (VANCOCIN) 1,250 mg in sodium chloride 0.9 % 250 mL IVPB     1,250 mg 166.7 mL/hr over 90 Minutes Intravenous  Once 06/25/18 0642 06/25/18 0927   06/25/18 0645  ceFEPIme (MAXIPIME) 2 g in sodium chloride 0.9 % 100 mL IVPB     2 g 200 mL/hr over 30 Minutes Intravenous  Once 06/25/18 2956 06/25/18 0727       RENAL A:  Chronic foley. Normal creat P:  monitor  ELECTROLYTES A:  At risk imbalance P: monitor   GASTROINTESTINAL A:   NPO  P:   TF PPI  HEMATOLOGIC A:  Anemia chronic and critical illness   P:  - PRBC for hgb </= 6.9gm%    - exceptions are   -  if ACS susepcted/confirmed then transfuse for hgb  </= 8.0gm%,  or    -  active bleeding with hemodynamic instability, then transfuse regardless of hemoglobin value   At at all times try to transfuse 1 unit prbc as possible with exception of active hemorrhage     ENDOCRINE A:   At risk low and high sugar   P:   ssi  MSK/DERM ? Has decub  monitopr    Best practice:  Diet: TF at goal per dietitian  Pain/Anxiety/Delirium protocol (if indicated): holding currently, prn propofol/ fentanyl VAP protocol (if indicated): yes DVT prophylaxis: SCD GI prophylaxis: protonix Glucose control: SSI  Mobility: BR, seizure precautions  Code Status: Full  Family Communication: will update wife, Kamonte Teta Disposition: ICU   ATTESTATION & SIGNATURE   The patient Lee [name] is critically ill with multiple organ systems failure and requires high complexity decision making for assessment and support, frequent evaluation and titration of therapies, application of advanced monitoring technologies and extensive interpretation of multiple databases.   Critical Care Time devoted to patient  care services described in this note is  30  Minutes. This time reflects time of care of this signee Dr Kalman Shan. This critical care time does not reflect procedure time, or teaching time or supervisory time of PA/NP/Med student/Med Resident etc but could involve care discussion time     Dr. Kalman Shan, M.D., [facility]C.P Pulmonary and Critical Care Medicine Staff Physician [facility] Pager: [phone], If no answer or between  15:00h - 7:00h: call [phone]  06/28/2018 12:41 PM

## 2018-06-29 ENCOUNTER — Inpatient Hospital Stay (HOSPITAL_COMMUNITY): Payer: Medicare Other

## 2018-06-29 LAB — CBC WITH DIFFERENTIAL/PLATELET
Abs Immature Granulocytes: 0.03 10*3/uL (ref 0.00–0.07)
Basophils Absolute: 0 10*3/uL (ref 0.0–0.1)
Basophils Relative: 1 %
Eosinophils Absolute: 0.1 10*3/uL (ref 0.0–0.5)
Eosinophils Relative: 2 %
HCT: 34.7 % — ABNORMAL LOW (ref 39.0–52.0)
Hemoglobin: 11.7 g/dL — ABNORMAL LOW (ref 13.0–17.0)
Immature Granulocytes: 0 %
Lymphocytes Relative: 24 %
Lymphs Abs: 1.8 10*3/uL (ref 0.7–4.0)
MCH: 32 pg (ref 26.0–34.0)
MCHC: 33.7 g/dL (ref 30.0–36.0)
MCV: 94.8 fL (ref 80.0–100.0)
Monocytes Absolute: 0.7 10*3/uL (ref 0.1–1.0)
Monocytes Relative: 9 %
Neutro Abs: 4.7 10*3/uL (ref 1.7–7.7)
Neutrophils Relative %: 64 %
Platelets: 295 10*3/uL (ref 150–400)
RBC: 3.66 MIL/uL — ABNORMAL LOW (ref 4.22–5.81)
RDW: 15.5 % (ref 11.5–15.5)
WBC: 7.3 10*3/uL (ref 4.0–10.5)
nRBC: 0 % (ref 0.0–0.2)

## 2018-06-29 LAB — BASIC METABOLIC PANEL
Anion gap: 8 (ref 5–15)
BUN: 12 mg/dL (ref 8–23)
CO2: 25 mmol/L (ref 22–32)
Calcium: 8.7 mg/dL — ABNORMAL LOW (ref 8.9–10.3)
Chloride: 105 mmol/L (ref 98–111)
Creatinine, Ser: 0.49 mg/dL — ABNORMAL LOW (ref 0.61–1.24)
GFR calc Af Amer: >60 mL/min (ref >60)
GFR calc non Af Amer: >60 mL/min (ref >60)
Glucose, Bld: 116 mg/dL — ABNORMAL HIGH (ref 70–99)
Potassium: 3.8 mmol/L (ref 3.5–5.1)
Sodium: 138 mmol/L (ref 135–145)

## 2018-06-29 LAB — HEPATIC FUNCTION PANEL
ALT: 20 U/L (ref 0–44)
AST: 32 U/L (ref 15–41)
Albumin: 2.4 g/dL — ABNORMAL LOW (ref 3.5–5.0)
Alkaline Phosphatase: 63 U/L (ref 38–126)
Bilirubin, Direct: 0.3 mg/dL — ABNORMAL HIGH (ref 0.0–0.2)
Indirect Bilirubin: 0.3 mg/dL (ref 0.3–0.9)
Total Bilirubin: 0.6 mg/dL (ref 0.3–1.2)
Total Protein: 6.2 g/dL — ABNORMAL LOW (ref 6.5–8.1)

## 2018-06-29 LAB — GLUCOSE, CAPILLARY
Glucose-Capillary: 101 mg/dL — ABNORMAL HIGH (ref 70–99)
Glucose-Capillary: 106 mg/dL — ABNORMAL HIGH (ref 70–99)
Glucose-Capillary: 107 mg/dL — ABNORMAL HIGH (ref 70–99)
Glucose-Capillary: 109 mg/dL — ABNORMAL HIGH (ref 70–99)
Glucose-Capillary: 110 mg/dL — ABNORMAL HIGH (ref 70–99)
Glucose-Capillary: 90 mg/dL (ref 70–99)

## 2018-06-29 LAB — AMMONIA: Ammonia: 29 umol/L (ref 9–35)

## 2018-06-29 LAB — LACOSAMIDE: Lacosamide: <0.5 ug/mL — ABNORMAL LOW (ref 5.0–10.0)

## 2018-06-29 LAB — LACTIC ACID, PLASMA: Lactic Acid, Venous: 1.4 mmol/L (ref 0.5–1.9)

## 2018-06-29 LAB — VALPROIC ACID LEVEL: Valproic Acid Lvl: <10 ug/mL — ABNORMAL LOW (ref 50.0–100.0)

## 2018-06-29 LAB — PHOSPHORUS: Phosphorus: 3.1 mg/dL (ref 2.5–4.6)

## 2018-06-29 LAB — MAGNESIUM: Magnesium: 1.7 mg/dL (ref 1.7–2.4)

## 2018-06-29 MED ORDER — MAGNESIUM SULFATE 2 GM/50ML IV SOLN
2.0000 g | Freq: Once | INTRAVENOUS | Status: AC
  Administered 2018-06-29: 2 g via INTRAVENOUS
  Filled 2018-06-29: qty 50

## 2018-06-29 MED ORDER — CARBAMAZEPINE 200 MG PO TABS
300.0000 mg | ORAL_TABLET | Freq: Two times a day (BID) | ORAL | Status: DC
  Administered 2018-06-29 – 2018-07-06 (×14): 300 mg
  Filled 2018-06-29 (×16): qty 1.5

## 2018-06-29 MED ORDER — LORAZEPAM 2 MG/ML IJ SOLN
2.0000 mg | INTRAMUSCULAR | Status: DC | PRN
  Administered 2018-06-29: 2 mg via INTRAVENOUS
  Filled 2018-06-29: qty 1

## 2018-06-29 MED ORDER — VALPROATE SODIUM 500 MG/5ML IV SOLN
750.0000 mg | Freq: Three times a day (TID) | INTRAVENOUS | Status: DC
  Administered 2018-06-29 – 2018-07-04 (×15): 750 mg via INTRAVENOUS
  Filled 2018-06-29 (×17): qty 7.5
  Filled 2018-06-29: qty 5
  Filled 2018-06-29: qty 7.5

## 2018-06-29 NOTE — Progress Notes (Signed)
NAME:  Lee [name], MRN:  [id number], DOB:  January 31, 1950, LOS: 4 ADMISSION DATE:  06/25/2018, CONSULTATION DATE:  06/25/18  REFERRING MD: Rancour - EM  , CHIEF COMPLAINT:  Seizure disorder, acute respiratory failure    Brief History   68 yo M w/ dementia, seizures, presents 6/17 following witnessed 10-15 minute GTC seizure from nursing home.  Intubated for airway protection.  SNF since 2017 per wife - due to dementia and wandering off. Pr wife prior to admit - SNF was not giving medicine prior to admit  Dementia - could communicate to wife. Could speak full sentences as of last week .  Does have diapers. He can sit by self and walk a bit   Past Medical History  CVA Dementia Seizure disorder HTN  Significant Hospital Events   6/17 admitted  6/19 - Remains on LTM  Currently on propofol 15 mcg/kg/min tmax 98.5  Consults:  Neurology   Procedures:  EEG 6/17 >>> Slowing and few left temporoparietal epileptiform discharges.  No evidence of ongoing seizures. LTM 6/18 >> no evidence of SE, intermittent epileptiform discharges in the right anterior temporal region suggesting risk of focal epileptognenic city, otherwise generalized polymorphic delta activity suggesting severe encephalopathy but medication effect can not be excluded.  LTM 6/19 >>  Significant Diagnostic Tests:  CT Head non-con 6/17 > stable white matter changes and atrophy from prior. No evidence of acute infarction, hemorrhage or mass lesion. Remote appearing lacunar infarct L posterior inferior cerebellum   CXR 6/17> no focal opacities. Emphysemous appearing changes. ETT in satisfactory position   6/20 MRI brain - reviewed.  Punctate right corona radiata area of acute infarction-likely incidental. No evidence of anoxic injury or any large structural lesions to explain current presentation. reviewed.   Micro Data:  6/17 SARS Cov2 - not detected 6/17 BCx >> e.coli >> multi-resistant (including ceftriaxone) 6/17  UCx >> 40K colonies GNR >> klebsiella  6/17 Tracheal aspirate>>  6/19 MRSA PCR - positive 6/20 - no change. Not all that responsive - plateaued compared to yesterday. Bood culture + ESBL. Opens eyes but no follow commands. Neuro recommending MRI brain today - Punctate right corona radiata area of acute infarction-likely incidental. No evidence of anoxic injury or any large structural lesions to explain current presentation.    Antimicrobials:  Vanc 6/17  Cefepime 6/17 Flagyl 6/17 6/17 ceftriaxone >>6/19 6/19 meropenum >>  Interim history/subjective:    6/21 - no change other than followed some commands last night  Objective   Blood pressure 137/82, pulse 83, temperature 98.7 F (37.1 C), temperature source Oral, resp. rate 18, height 5\' 6"  (1.676 m), weight 60.6 kg, SpO2 100 %.    Vent Mode: PRVC FiO2 (%):  [40 %] 40 % Set Rate:  [18 bmp] 18 bmp Vt Set:  [510 mL] 510 mL PEEP:  [5 cmH20] 5 cmH20 Plateau Pressure:  [13 cmH20-16 cmH20] 15 cmH20   Intake/Output Summary (Last 24 hours) at 06/29/2018 1103 Last data filed at 06/29/2018 0600 Gross per 24 hour  Intake 1021.59 ml  Output 1500 ml  Net -478.41 ml   Filed Weights   06/27/18 0442 06/28/18 0500 06/29/18 0500  Weight: 58.3 kg 59.3 kg 60.6 kg    General Appearance:  Looks criticall ill. FRAIL Head:  Normocephalic, without obvious abnormality, atraumatic Eyes:  PERRL - yes, conjunctiva/corneas - muddy    . Keep eyes closed. FLickers eye lids Ears:  Normal external ear canals, both ears Nose:  G tube - no  Throat:  ETT TUBE - yes , OG tube - yes Neck:  Supple,  No enlargement/tenderness/nodules Lungs: Clear to auscultation bilaterally, Ventilator   Synchrony - yes Heart:  S1 and S2 normal, no murmur, CVP - no.  Pressors - no Abdomen:  Soft, no masses, no organomegaly Genitalia / Rectal:  Not done Extremities:  Extremities- intact Skin:  ntact in exposed areas . Sacral area - no decube per RN Neurologic:  Sedation -  none -> RASS - -3 . Moves all 4s - wiggled toes last night per report. CAM-ICU - x . Orientation - x        LABS    PULMONARY Recent Labs  Lab 06/25/18 0759  PHART 7.311*  PCO2ART 43.2  PO2ART 467.0*  HCO3 21.6  TCO2 23  O2SAT 100.0    CBC Recent Labs  Lab 06/27/18 1102 06/28/18 0537 06/29/18 0359  HGB 12.3* 12.0* 11.7*  HCT 36.2* 35.0* 34.7*  WBC 11.5* 8.1 7.3  PLT 299 320 295    COAGULATION No results for input(s): INR in the last 168 hours.  CARDIAC   Recent Labs  Lab 06/25/18 0653  TROPONINI 0.07*   No results for input(s): PROBNP in the last 168 hours.   CHEMISTRY Recent Labs  Lab 06/25/18 0639 06/25/18 0759  06/26/18 0419 06/26/18 1140 06/26/18 1827 06/27/18 0341 06/27/18 1102 06/27/18 1619 06/28/18 0537 06/29/18 0359  NA 139 140  --  138  --   --   --  140  --  138 138  K 3.7 3.6  --  3.3*  --   --   --  3.7  --  4.1 3.8  CL 104  --   --  107  --   --   --  105  --  107 105  CO2 12*  --   --  22  --   --   --  24  --  21* 25  GLUCOSE 148*  --   --  89  --   --   --  95  --  115* 116*  BUN 13  --   --  6*  --   --   --  7*  --  12 12  CREATININE 1.16  --   --  0.55*  --   --   --  0.66  --  0.54* 0.49*  CALCIUM 9.6  --   --  8.5*  --   --   --  8.6*  --  8.4* 8.7*  MG  --   --    < > 1.7 1.8 2.1 1.8  --  2.2  --  1.7  PHOS  --   --    < > 2.0* 2.0* 2.9 3.0  --  3.6  --  3.1   < > = values in this interval not displayed.   Estimated Creatinine Clearance: 74.7 mL/min (A) (by C-G formula based on SCr of 0.49 mg/dL (L)).   LIVER Recent Labs  Lab 06/25/18 0639 06/29/18 0359  AST 37 32  ALT 26 20  ALKPHOS 87 63  BILITOT 0.4 0.6  PROT 7.7 6.2*  ALBUMIN 3.6 2.4*     INFECTIOUS Recent Labs  Lab 06/25/18 0639 06/25/18 0830 06/25/18 1047 06/29/18 0359  LATICACIDVEN >11.0* 5.3*  --  1.4  PROCALCITON  --   --  2.83  --      ENDOCRINE CBG (last 3)  Recent Labs    06/28/18 2337  06/29/18 0310 06/29/18 0842  GLUCAP 93  110* 90         IMAGING x48h  - image(s) personally visualized  -   highlighted in bold Mr Brain Wo Contrast  Result Date: 06/28/2018 CLINICAL DATA:  Encephalopathy EXAM: MRI HEAD WITHOUT CONTRAST TECHNIQUE: Multiplanar, multiecho pulse sequences of the brain and surrounding structures were obtained without intravenous contrast. COMPARISON:  CT head 06/25/2018 FINDINGS: Brain: Generalized atrophy. Ventricular enlargement due to atrophy. Negative for hydrocephalus. Small acute infarct in the right corona radiata. No other acute infarct. Chronic microvascular ischemic changes in the white matter and pons. Chronic microhemorrhage left occipital left parietal lobe. No mass or edema or midline shift. Vascular: Normal arterial flow voids. Skull and upper cervical spine: Negative Sinuses/Orbits: Mild mucosal edema paranasal sinuses.  Normal orbit Other: None IMPRESSION: Atrophy and chronic microvascular ischemic change in the white matter and pons Small subcentimeter acute infarct right corona radiata. Electronically Signed   By: Marlan Palau M.D.   On: 06/28/2018 12:44   Dg Chest Port 1 View  Result Date: 06/29/2018 CLINICAL DATA:  Intubated EXAM: PORTABLE CHEST 1 VIEW COMPARISON:  06/26/2018 chest radiograph. FINDINGS: Endotracheal tube tip is 6.1 cm above the carina. Enteric tube terminates in the proximal stomach. Stable cardiomediastinal silhouette with normal heart size. No pneumothorax. No pleural effusion. Mild left basilar atelectasis. No pulmonary edema. IMPRESSION: 1. Well-positioned support structures. 2. Mild left basilar atelectasis. Electronically Signed   By: Delbert Phenix M.D.   On: 06/29/2018 09:47      Resolved Hospital Problem list    Assessment & Plan:   ASSESSMENT / PLAN:  PULMONARY A:  Acute resp failure due to status and acuet enceph   06/29/2018 - > does not meet criteria for SBT/Extubation in setting of Acute Respiratory Failure due to ongoing sepsis and  encephlopathy   P:   Full vent supportt   NEUROLOGIC A:   Seizure hx Acute encephalopathy ? Related to seizure vs infection/ toxic/ metabolic - -history of non-compliance with AEDs/ resides in nursing home. MRI  -  Punctate right corona radiata area of acute infarction-likely incidental. No evidence of anoxic injury or any large structural lesions to explain current presentation.      P:   Per neuro    VASCULAR A:   Maintains bp/hr  P:  MAP goal > 65  CARDIAC STRUCTURAL A: ECHO June 28, 2018 - ef 65% with impared dias dysfn  P: Cycle trop  CARDIAC ELECTRICAL A: sinus   P: monitor  INFECTIOUS A:   MRSA PCR + 06/27/2018 Coag neg stph blood 06/25/2018 E. Coli bacteremia 06/25/2018 Klebsiella urine 06/25/2018  - chronic foley from SNF P:   Merrem as above   RENAL A:  Chronic foley. Normal creat P:  monitor  ELECTROLYTES A:  Mild low mag P: Replete mag monitor   GASTROINTESTINAL A:   NPO  P:   TF PPI  HEMATOLOGIC A:  Anemia chronic and critical illness   P:  - PRBC for hgb </= 6.9gm%    - exceptions are   -  if ACS susepcted/confirmed then transfuse for hgb </= 8.0gm%,  or    -  active bleeding with hemodynamic instability, then transfuse regardless of hemoglobin value   At at all times try to transfuse 1 unit prbc as possible with exception of active hemorrhage     ENDOCRINE A:   At risk low and high sugar   P:   ssi  MSK/DERM 6/21 -  rn reports no decub  monitopr    Best practice:  Diet: TF at goal per dietitian  Pain/Anxiety/Delirium protocol (if indicated): holding currently, prn propofol/ fentanyl VAP protocol (if indicated): yes DVT prophylaxis: SCD GI prophylaxis: protonix Glucose control: SSI  Mobility: BR, seizure precautions  Code Status: Full  Family Communication: 06/29/2018 - wife Margrett Rud updated. Wife reports - goal is to gt his regular medicine and back to pre-admit baseline and be able to  speak. Full code for now  Disposition: ICU   ATTESTATION & SIGNATURE   The patient VALMORE RAMOUTAR is critically ill with multiple organ systems failure and requires high complexity decision making for assessment and support, frequent evaluation and titration of therapies, application of advanced monitoring technologies and extensive interpretation of multiple databases.   Critical Care Time devoted to patient care services described in this note is  30  Minutes. This time reflects time of care of this signee Dr Kalman Shan. This critical care time does not reflect procedure time, or teaching time or supervisory time of PA/NP/Med student/Med Resident etc but could involve care discussion time     Dr. Kalman Shan, M.D., [facility].C.P Pulmonary and Critical Care Medicine Staff Physician [facility] [place] Pager: [phone], If no answer or between  15:00h - 7:00h: call [phone]  06/29/2018 11:03 AM

## 2018-06-29 NOTE — Progress Notes (Signed)
Elink attempted video chat with family via tablet email and cell number but neither were able to join chat.

## 2018-06-29 NOTE — Progress Notes (Signed)
Neurology Progress Note   S:// Night shift RN at bedside reports that he was more lucid and followed commands for about 1hr overnight. He is more alert, tracking and attended on right with purposeful movement towards tubes for me this am.    O:// Current vital signs: BP (!) 154/88   Pulse 83   Temp 98.7 F (37.1 C) (Oral)   Resp 18   Ht _0  (1.676 m)   Wt 60.6 kg   SpO2 100%   BMI 21.56 kg/m  Vital signs in last 24 hours: Temp:  [97.8 F (36.6 C)-99.4 F (37.4 C)] 98.7 F (37.1 C) (06/21 0350) Pulse Rate:  [63-97] 83 (06/21 0817) Resp:  [7-22] 18 (06/21 0817) BP: (104-176)/(73-101) 154/88 (06/21 0817) SpO2:  [99 %-100 %] 100 % (06/21 0817) FiO2 (%):  [40 %] 40 % (06/21 0817) Weight:  [60.6 kg] 60.6 kg (06/21 0500) General: frail, elderly, critically ill/Intubated, propofol off during exam  HEENT: Normocephalic atraumatic CVS: Regular rate rhythm Respiratory: Vented Neurological exam Not on any sedation at the time of exam.  Vented.  Alert, tracking and attended on right with purposeful movement towards tubes for me this am  But. Still does not follow commands. Cranial nerves: Pupils equal round reactive light, no gaze preference. face symmetry difficult to ascertain due to ETT. He has positive corneals, positive gag, positive cough, he did breathe over the vent. Motor exam: Purposeful on right UE towards tubes/lines.  Lower extremities mild withdrawal. Sensory exam: As above Coordination cannot be assessed    Medications  Current Facility-Administered Medications:  .  acetaminophen (TYLENOL) tablet 650 mg, 650 mg, Per Tube, Q4H PRN, Chesley Mires, MD .  carbamazepine (TEGRETOL) tablet 300 mg, 300 mg, Per Tube, BID, Amie Portland, MD .  chlorhexidine gluconate (MEDLINE KIT) (PERIDEX) 0.12 % solution 15 mL, 15 mL, Mouth Rinse, BID, Sood, Vineet, MD, 15 mL at 06/28/18 2045 .  Chlorhexidine Gluconate Cloth 2 % PADS 6 each, 6 each, Topical, Q0600, Chesley Mires, MD .   Chlorhexidine Gluconate Cloth 2 % PADS 6 each, 6 each, Topical, Daily, Brand Males, MD, 6 each at 06/28/18 [phone] .  Chlorhexidine Gluconate Cloth 2 % PADS 6 each, 6 each, Topical, Q0600, Brand Males, MD, 6 each at 06/28/18 2130 .  clopidogrel (PLAVIX) tablet 75 mg, 75 mg, Per Tube, Daily, Chesley Mires, MD, 75 mg at 06/28/18 0902 .  enoxaparin (LOVENOX) injection 40 mg, 40 mg, Subcutaneous, Q24H, Sood, Vineet, MD, 40 mg at 06/28/18 1357 .  feeding supplement (JEVITY 1.2 CAL) liquid 1,000 mL, 1,000 mL, Per Tube, Continuous, Ramaswamy, Murali, MD, Last Rate: 35 mL/hr at 06/27/18 1800, 1,000 mL at 06/27/18 1800 .  feeding supplement (PRO-STAT SUGAR FREE 64) liquid 60 mL, 60 mL, Per Tube, BID, Brand Males, MD, 60 mL at 06/28/18 2101 .  fentaNYL (SUBLIMAZE) injection 50 mcg, 50 mcg, Intravenous, Q2H PRN, Rancour, Stephen, MD .  hydrALAZINE (APRESOLINE) injection 10 mg, 10 mg, Intravenous, Q4H PRN, Anders Simmonds, MD .  insulin aspart (novoLOG) injection 0-15 Units, 0-15 Units, Subcutaneous, Q4H, Bowser, Grace E, NP .  lacosamide (VIMPAT) tablet 300 mg, 300 mg, Per Tube, BID, Bowser, Laurel Dimmer, NP, 300 mg at 06/28/18 2101 .  lactated ringers infusion, , Intravenous, Continuous, Jennelle Human B, NP, Last Rate: 10 mL/hr at 06/29/18 0500 .  levETIRAcetam (KEPPRA) 100 MG/ML solution 1,500 mg, 1,500 mg, Per Tube, BID, Brand Males, MD, 1,500 mg at 06/28/18 2046 .  MEDLINE mouth rinse, 15 mL, Mouth  Rinse, 10 times per day, Chesley Mires, MD, 15 mL at 06/29/18 0625 .  meropenem (MERREM) 1 g in sodium chloride 0.9 % 100 mL IVPB, 1 g, Intravenous, Q8H, Einar Grad, RPH, Last Rate: 200 mL/hr at 06/29/18 0627, 1 g at 06/29/18 3818 .  mupirocin ointment (BACTROBAN) 2 % 1 application, 1 application, Nasal, BID, Brand Males, MD, 1 application at 29/93/71 2102 .  ondansetron (ZOFRAN) injection 4 mg, 4 mg, Intravenous, Q6H PRN, Bowser, Grace E, NP .  pantoprazole (PROTONIX) injection 40  mg, 40 mg, Intravenous, QHS, Bowser, Grace E, NP, 40 mg at 06/28/18 2100 .  propofol (DIPRIVAN) 1000 MG/100ML infusion, 0-50 mcg/kg/min, Intravenous, Continuous, Rancour, Stephen, MD, Stopped at 06/27/18 [phone] .  sodium chloride flush (NS) 0.9 % injection 10-40 mL, 10-40 mL, Intracatheter, Q12H, Chesley Mires, MD, 10 mL at 06/28/18 2103 .  sodium chloride flush (NS) 0.9 % injection 10-40 mL, 10-40 mL, Intracatheter, PRN, Chesley Mires, MD .  valproate (DEPACON) 750 mg in dextrose 5 % 50 mL IVPB, 750 mg, Intravenous, Q8H, Amie Portland, MD Labs CBC    Component Value Date/Time   WBC 7.3 06/29/2018 0359   RBC 3.66 (L) 06/29/2018 0359   HGB 11.7 (L) 06/29/2018 0359   HGB 14.9 07/31/2017 1503   HCT 34.7 (L) 06/29/2018 0359   HCT 44.6 07/31/2017 1503   PLT 295 06/29/2018 0359   PLT 383 07/31/2017 1503   MCV 94.8 06/29/2018 0359   MCV 96 07/31/2017 1503   MCH 32.0 06/29/2018 0359   MCHC 33.7 06/29/2018 0359   RDW 15.5 06/29/2018 0359   RDW 14.5 07/31/2017 1503   LYMPHSABS 1.8 06/29/2018 0359   LYMPHSABS 2.5 07/31/2017 1503   MONOABS 0.7 06/29/2018 0359   EOSABS 0.1 06/29/2018 0359   EOSABS 0.2 07/31/2017 1503   BASOSABS 0.0 06/29/2018 0359   BASOSABS 0.0 07/31/2017 1503    CMP     Component Value Date/Time   NA 138 06/29/2018 0359   NA 145 (H) 07/31/2017 1503   K 3.8 06/29/2018 0359   CL 105 06/29/2018 0359   CO2 25 06/29/2018 0359   GLUCOSE 116 (H) 06/29/2018 0359   BUN 12 06/29/2018 0359   BUN 9 07/31/2017 1503   CREATININE 0.49 (L) 06/29/2018 0359   CALCIUM 8.7 (L) 06/29/2018 0359   PROT 6.2 (L) 06/29/2018 0359   PROT 7.3 07/31/2017 1503   ALBUMIN 2.4 (L) 06/29/2018 0359   ALBUMIN 4.4 07/31/2017 1503   AST 32 06/29/2018 0359   ALT 20 06/29/2018 0359   ALKPHOS 63 06/29/2018 0359   BILITOT 0.6 06/29/2018 0359   BILITOT <0.2 07/31/2017 1503   GFRNONAA >60 06/29/2018 0359   GFRAA >60 06/29/2018 0359    Imaging I have reviewed images in epic and the results pertinent  to this consultation are: CT head negative for acute process.  Assessment: 68 year old man with history of strokes, seizures, resident of the memory care facility with reported noncompliance to medications as he was refusing. Normally his wife is able to help with his compliance, but no unable to visit him at [facility] because of COVID restrictions. Thus, due to lack of maintance meds, he became ridgid from PD and brought in with breakthrough seizures. Blood cultures showing bacteremia which might have additionally lowered his seizure threshold.  Long-term EEG for 2 days only shows left temporal epileptiform discharges which are present on his prior EEGs as well and are probably his baseline. No ongoing seizure activity on EEG, stopped on 6/19.  Impression:  Encephalopay- wax/waning, but overall min improvement. Dense encephalopathy is multifactorial, initially d/t post ictal state, poor neurologic baseline and now more related to his bacteremia and sepsis.  -MRI brain: no acute injury or structural lesion.   -VPA level is low; likely competing with CBZ. Will adjust ASDs  -Ck NH4  Epilepsy with Breakthrough seizures- in the setting of toxic metabolic derangement/bacteremia and medication non-compliance.   Severe Dementia- SNF memory lock-down unit at baseline. May be difficult to get pt back to demented baseline after more neurologic insult and further difficult to [facility] seizures.   Recommendations: NH4 level Decreased CBZ to 353m BID Increase VPA to [id number] No sedation, wean vent and extubate as able Supportive care and treat all medical underlying issues Treat bacteremia per PCCM Consider Palliative Care consult for GOSheldonARNP-C, ANVP-BC Pager: [phone] Neurohospitalist If 7pm to 7am, please call on call as listed on AMION

## 2018-06-29 NOTE — TOC Initial Note (Signed)
Transition of Care [facility]) - Initial/Assessment Note    Patient Details  Name: Lee [name] MRN: [id number] Date of Birth: September 13, 1950  Transition of Care [facility]) CM/SW Contact:    Levada Schilling Phone Number: 06/29/2018, 10:32 AM  Clinical Narrative:  CSW spoke with pt's spouse by phone.  Pt is currently intubated/tracheostomy.  Pt's spouse expressed her frustration with her spouse's current SNF due to not giving him his medications as directed daily for his seizures.  Pt's spouse has repeatedly told his SNF to call her if he will not take his medications because she can usually get him to take it. CSW encouraged pt's spouse to contact CSW as needed.  CSW will continue to follow patient for support and facilitate discharge to SNF once medically stable.                  Expected Discharge Plan: Skilled Nursing Facility Barriers to Discharge: Continued Medical Work up, SNF Pending bed offer   Patient Goals and CMS Choice  Pt's spouse states" I want him to adapt and flourish at the facility".   CMS Medicare.gov Compare Post Acute Care list provided to:: Patient Represenative (must comment)(sent to spouse by email) Choice offered to / list presented to : Spouse  Expected Discharge Plan and Services Expected Discharge Plan: Skilled Nursing Facility                                              Prior Living Arrangements/Services     Patient language and need for interpreter reviewed:: No Do you feel safe going back to the place where you live?: Yes      Need for Family Participation in Patient Care: Yes (Comment) Care giver support system in place?: Yes (comment)(SNF)   Criminal Activity/Legal Involvement Pertinent to Current Situation/Hospitalization: No - Comment as needed  Activities of Daily Living      Permission Sought/Granted Permission sought to share information with : Facility Medical sales representative, Family Supports Permission granted to share  information with : Yes, Verbal Permission Granted  Share Information with NAME: Abduljabbar Koski [phone]  Permission granted to share info w AGENCY: yes  Permission granted to share info w Relationship: Spouse  Permission granted to share info w Contact Information: yes  Emotional Assessment Appearance:: Other (Comment Required(not able to access) Attitude/Demeanor/Rapport: Unable to Assess Affect (typically observed): Unable to Assess Orientation: : (intubated/tracheostomy) Alcohol / Substance Use: Not Applicable Psych Involvement: No (comment)  Admission diagnosis:  Respiratory failure (HCC) [J96.90] Seizures (HCC) [R56.9] Acute respiratory failure with hypoxia (HCC) [J96.01] Patient Active Problem List   Diagnosis Date Noted  . Acute respiratory failure with hypoxia (HCC)   . Seizure disorder (HCC) 06/25/2018  . Seizure (HCC) 08/29/2016  . Leukocytosis 08/29/2016  . History of CVA (cerebrovascular accident) 08/29/2016  . Somnolence 08/29/2016  . Post-ictal state (HCC) 08/29/2016  . Lip laceration   . Pain   . Leg weakness, bilateral   . Leg pain, bilateral 08/17/2016  . UTI (urinary tract infection) 08/15/2016  . Aggression 07/26/2016  . Dementia with behavioral disturbance (HCC) 07/26/2016  . Elevated troponin 12/30/2015  . Chest pain 12/30/2015  . Pneumonia, likely aspiration 11/15/2013  . Status epilepticus (HCC) 11/14/2013  . Essential hypertension 11/14/2013  . Cerebral thrombosis with cerebral infarction (HCC) 11/07/2013  . Seizures (HCC) 11/06/2013  . Chronic airway obstruction, not  elsewhere classified 09/22/2013  . Cerebral infarction due to unspecified mechanism 09/22/2013  . Hyperlipidemia 09/18/2013  . Encephalopathy acute 09/11/2013  . Altered mental status 09/11/2013  . CVA (cerebral infarction) 09/11/2013  . Hyponatremia 09/11/2013  . Acute encephalopathy 09/11/2013  . Aftercare following surgery of the circulatory system, NEC 08/25/2012  . Foot  swelling 02/04/2012  . Drainage from wound 01/04/2012  . Peripheral vascular disease, unspecified (HCC) 11/05/2011  . Pain in limb 11/05/2011  . 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 PAD (peripheral artery disease) (HCC) 10/28/2011  . TOBACCO ABUSE 02/06/2007  . SYMPTOM, EDEMA 06/13/2006  . ERECTILE DYSFUNCTION 01/25/2006   PCP:  Mirna Mires, MD Pharmacy:  No Pharmacies Listed    Social Determinants of Health (SDOH) Interventions    Readmission Risk Interventions No flowsheet data found.

## 2018-06-29 NOTE — Progress Notes (Signed)
[place] Progress Note Patient Name: Lee [name] DOB: 1950-02-04 MRN: [id number]   Date of Service  06/29/2018  HPI/Events of Note  Pt is intubated, gets agitated with care.  Pt denies pain but pulls at the washcloth and tries to reach for the ETT while the nurses are providing him care.  eICU Interventions  Ativan prn ordered.     Intervention Category Minor Interventions: Agitation / anxiety - evaluation and management  Elsie Lincoln 06/29/2018, 9:09 PM

## 2018-06-30 ENCOUNTER — Inpatient Hospital Stay (HOSPITAL_COMMUNITY): Payer: Medicare Other

## 2018-06-30 DIAGNOSIS — G40909 Epilepsy, unspecified, not intractable, without status epilepticus: Secondary | ICD-10-CM

## 2018-06-30 DIAGNOSIS — L8990 Pressure ulcer of unspecified site, unspecified stage: Secondary | ICD-10-CM | POA: Insufficient documentation

## 2018-06-30 DIAGNOSIS — G9340 Encephalopathy, unspecified: Secondary | ICD-10-CM

## 2018-06-30 LAB — GLUCOSE, CAPILLARY
Glucose-Capillary: 101 mg/dL — ABNORMAL HIGH (ref 70–99)
Glucose-Capillary: 104 mg/dL — ABNORMAL HIGH (ref 70–99)
Glucose-Capillary: 109 mg/dL — ABNORMAL HIGH (ref 70–99)
Glucose-Capillary: 111 mg/dL — ABNORMAL HIGH (ref 70–99)
Glucose-Capillary: 77 mg/dL (ref 70–99)

## 2018-06-30 LAB — TROPONIN I: Troponin I: <0.03 ng/mL (ref <0.03)

## 2018-06-30 LAB — BASIC METABOLIC PANEL
Anion gap: 8 (ref 5–15)
BUN: 12 mg/dL (ref 8–23)
CO2: 24 mmol/L (ref 22–32)
Calcium: 8.5 mg/dL — ABNORMAL LOW (ref 8.9–10.3)
Chloride: 104 mmol/L (ref 98–111)
Creatinine, Ser: 0.5 mg/dL — ABNORMAL LOW (ref 0.61–1.24)
GFR calc Af Amer: >60 mL/min (ref >60)
GFR calc non Af Amer: >60 mL/min (ref >60)
Glucose, Bld: 129 mg/dL — ABNORMAL HIGH (ref 70–99)
Potassium: 3.7 mmol/L (ref 3.5–5.1)
Sodium: 136 mmol/L (ref 135–145)

## 2018-06-30 LAB — CBC WITH DIFFERENTIAL/PLATELET
Abs Immature Granulocytes: 0.03 10*3/uL (ref 0.00–0.07)
Basophils Absolute: 0 10*3/uL (ref 0.0–0.1)
Basophils Relative: 0 %
Eosinophils Absolute: 0.2 10*3/uL (ref 0.0–0.5)
Eosinophils Relative: 2 %
HCT: 37.7 % — ABNORMAL LOW (ref 39.0–52.0)
Hemoglobin: 12.6 g/dL — ABNORMAL LOW (ref 13.0–17.0)
Immature Granulocytes: 0 %
Lymphocytes Relative: 20 %
Lymphs Abs: 1.8 10*3/uL (ref 0.7–4.0)
MCH: 31.7 pg (ref 26.0–34.0)
MCHC: 33.4 g/dL (ref 30.0–36.0)
MCV: 94.7 fL (ref 80.0–100.0)
Monocytes Absolute: 0.7 10*3/uL (ref 0.1–1.0)
Monocytes Relative: 7 %
Neutro Abs: 6.6 10*3/uL (ref 1.7–7.7)
Neutrophils Relative %: 71 %
Platelets: 373 10*3/uL (ref 150–400)
RBC: 3.98 MIL/uL — ABNORMAL LOW (ref 4.22–5.81)
RDW: 15.3 % (ref 11.5–15.5)
WBC: 9.3 10*3/uL (ref 4.0–10.5)
nRBC: 0 % (ref 0.0–0.2)

## 2018-06-30 LAB — PHOSPHORUS: Phosphorus: 3 mg/dL (ref 2.5–4.6)

## 2018-06-30 LAB — PROTIME-INR
INR: 1.1 (ref 0.8–1.2)
Prothrombin Time: 14.2 seconds (ref 11.4–15.2)

## 2018-06-30 LAB — MAGNESIUM: Magnesium: 1.8 mg/dL (ref 1.7–2.4)

## 2018-06-30 MED ORDER — JEVITY 1.2 CAL PO LIQD
1000.0000 mL | ORAL | Status: DC
  Administered 2018-06-30: 1000 mL
  Filled 2018-06-30 (×2): qty 1000

## 2018-06-30 MED ORDER — ORAL CARE MOUTH RINSE
15.0000 mL | Freq: Two times a day (BID) | OROMUCOSAL | Status: DC
  Administered 2018-06-30 – 2018-07-06 (×10): 15 mL via OROMUCOSAL

## 2018-06-30 MED ORDER — SODIUM CHLORIDE 0.9 % IV SOLN
50.0000 mg | Freq: Two times a day (BID) | INTRAVENOUS | Status: DC
  Administered 2018-06-30 – 2018-07-04 (×8): 50 mg via INTRAVENOUS
  Filled 2018-06-30 (×11): qty 5

## 2018-06-30 MED ORDER — PANTOPRAZOLE SODIUM 40 MG PO PACK
40.0000 mg | PACK | Freq: Every day | ORAL | Status: DC
  Administered 2018-06-30: 40 mg
  Filled 2018-06-30: qty 20

## 2018-06-30 MED ORDER — LEVETIRACETAM IN NACL 1500 MG/100ML IV SOLN
1500.0000 mg | Freq: Two times a day (BID) | INTRAVENOUS | Status: DC
  Administered 2018-06-30 – 2018-07-04 (×8): 1500 mg via INTRAVENOUS
  Filled 2018-06-30 (×10): qty 100

## 2018-06-30 NOTE — Progress Notes (Addendum)
[place] Progress Note Patient Name: Lee [name] DOB: [birth year] MRN: [id number]   Date of Service  06/30/2018  HPI/Events of Note  Notified by ICU RN that patient self extubated and is doing fine on nasal cannula.   Chart review indicates patient was intubated for airway protection.   On video assessment, pt is awake and alert but does not respond to my questioning.  He is not in respiratory distress. BP 156/119, HR 88, RR 22, 100% O2 sats.   eICU Interventions  Continue to monitor while in the ICU.     Intervention Category Minor Interventions: Other:  Elsie Lincoln 06/30/2018, 7:28 PM   8:33 PM Switched PO seizure meds to IV given that he has no enteral access at the moment - Keppra, Vimpat and Valproate are all in IV form.

## 2018-06-30 NOTE — Progress Notes (Addendum)
Arrived to room. Pt self extubated, with mittens on. Pt noted to not be in distress. Tube feedings discontinued. Pt placed on 3L [place] tolerating well. HR 83 SPO2 100 RR 19 . Pt able to vocalize his name. Pt baseline neuro status in question due to prior dementia diagnosis. RT and CCM doctor notifed. Will continue to monitor.

## 2018-06-30 NOTE — Progress Notes (Signed)
Arrived to patients room.. pt has extubated himself RN placed patient on 3LPM n/c tolerating well RR 17 SP02 100 HR 85.. Patient is not talking much he will look at me and mumble words,  He don't seem to be in any distress at this time.

## 2018-06-30 NOTE — Progress Notes (Addendum)
Nutrition Follow-up  DOCUMENTATION CODES:   Not applicable  INTERVENTION:   Increase tube feeding:  -Jevity 1.2 @ 41 ml/hr via OGT (984 ml) -60 ml Prostat BID  Provides: 1581 kcals, 115 grams protein, 794 ml free water. Meets 101% kcal needs and 100% of protein needs.   NUTRITION DIAGNOSIS:   Inadequate oral intake related to inability to eat as evidenced by NPO status.  Ongoing  GOAL:   Provide needs based on ASPEN/SCCM guidelines  Addressed via TF  MONITOR:   Diet advancement, Vent status, Skin, TF tolerance, Weight trends, Labs, I & O's  REASON FOR ASSESSMENT:   Consult Enteral/tube feeding initiation and management  ASSESSMENT:   Patient with PMH significant for CVA, COPD, ETOH abuse, PVD, dementia, seizure disorder, and HTN. Presents this admission s/p witnessed seizure.   RD working remotely.  Pt with low grade fever. Requiring intermittent fentanyl. No plans for extubation given increased secretions. OG noted to be at GE junction, RN advanced to stomach this am. Pt tolerating Jevity 1.2 @ 35 ml/hr. Plan to adjust rate now that propofol is d/c.   Admission weight: 66.5 kg Current weight 60.5 kg   Patient is currently intubated on ventilator support MV: 9.2 L/min Temp (24hrs), Avg:99 F (37.2 C), Min:98.2 F (36.8 C), Max:100.7 F (38.2 C)   I/O: +6,907 ml since admit UOP: 1,275 ml x 24 hrs   Medications: SS novolog Labs: CBG 104-111   Diet Order:   Diet Order            Diet NPO time specified  Diet effective now              EDUCATION NEEDS:   Not appropriate for education at this time  Skin:  Skin Assessment: Reviewed RN Assessment  Last BM:  6/21  Height:   Ht Readings from Last 1 Encounters:  06/25/18 5\' 6"  (1.676 m)    Weight:   Wt Readings from Last 1 Encounters:  06/30/18 60.5 kg    Ideal Body Weight:  64.5 kg  BMI:  Body mass index is 21.53 kg/m.  Estimated Nutritional Needs:   Kcal:  1564 kcal  Protein:   100-120 grams  Fluid:  >/= 1.5 L/day   Mariana Single RD, LDN Clinical Nutrition Pager # [phone]

## 2018-06-30 NOTE — Progress Notes (Signed)
Assisted tele visit to patient with family member.  Bryler Dibble M, RN  

## 2018-06-30 NOTE — Progress Notes (Signed)
NAME:  Lee [name], MRN:  [id number], DOB:  06/04/1950, LOS: 5 ADMISSION DATE:  06/25/2018, CONSULTATION DATE:  06/25/18  REFERRING MD: [facility] - EM  , CHIEF COMPLAINT:  Seizure disorder, acute respiratory failure    Brief History   68 yo M w/ dementia, seizures, presents 6/17 following witnessed 10-15 minute GTC seizure from nursing home.  Intubated for airway protection.  SNF since 2017 per wife - due to dementia and wandering off. Pr wife prior to admit - SNF was not giving medicine prior to admit  Dementia - could communicate to wife. Could speak full sentences as of last week .  Does have diapers. He can sit by self and walk a bit   Past Medical History  CVA Dementia Seizure disorder HTN  Significant Hospital Events   6/17 admitted  6/17 -  6/19 on LTM  + propofol   Consults:  Neurology   Procedures:  EEG 6/17 >>> Slowing and few left temporoparietal epileptiform discharges.  No evidence of ongoing seizures. LTM 6/18 >> no evidence of SE, intermittent epileptiform discharges in the right anterior temporal region suggesting risk of focal epileptognenic city, otherwise generalized polymorphic delta activity suggesting severe encephalopathy but medication effect can not be excluded.  ECHO June 28, 2018 - ef 65% with impared dias dysfn   Significant Diagnostic Tests:  CT Head non-con 6/17 > stable white matter changes and atrophy from prior. No evidence of acute infarction, hemorrhage or mass lesion. Remote appearing lacunar infarct L posterior inferior cerebellum    6/20 MRI brain -Punctate right corona radiata area of acute infarction-likely incidental.No evidence of anoxic injury or any large structural lesions to explain current presentation.  Micro Data:  6/17 SARS Cov2 - not detected 6/17 BCx >> e.coli >> multi-resistant (including ceftriaxone) 6/17 UCx >> 40K colonies GNR >> klebsiella  6/17 Tracheal aspirate>>  6/19 MRSA PCR -  positive     Antimicrobials:  Vanc 6/17  Cefepime 6/17 Flagyl 6/17 6/17 ceftriaxone >>6/19 6/19 meropenum >>  Interim history/subjective:   Low-grade febrile Remains critically ill, intubated Copious secretions Not much agitation on intermittent fentanyl  Objective   Blood pressure 113/84, pulse 88, temperature 98.3 F (36.8 C), temperature source Oral, resp. rate 18, height 5\' 6"  (1.676 m), weight 60.5 kg, SpO2 100 %.    Vent Mode: PRVC FiO2 (%):  [30 %-40 %] 30 % Set Rate:  [18 bmp] 18 bmp Vt Set:  [510 mL] 510 mL PEEP:  [5 cmH20] 5 cmH20 Plateau Pressure:  [12 cmH20-19 cmH20] 19 cmH20   Intake/Output Summary (Last 24 hours) at 06/30/2018 1128 Last data filed at 06/30/2018 1100 Gross per 24 hour  Intake 1651.34 ml  Output 1275 ml  Net 376.34 ml   Filed Weights   06/28/18 0500 06/29/18 0500 06/30/18 0500  Weight: 59.3 kg 60.6 kg 60.5 kg    Elderly man, orally intubated, no distress Mild pallor, no icterus, opens eyes spontaneously Copious secretions, faint right-sided rhonchi S1-S2 regular, no murmur Soft nontender abdomen Moves all 4 extremities, does not follow commands No edema  Chest x-ray 6/22 personally reviewed, basal infiltrate, NG tube with side-port slightly above diaphragm    Resolved Hospital Problem list    Assessment & Plan:   ASSESSMENT / PLAN:  PULMONARY A:  Acute resp failure due to status and acute enceph  P:   Spontaneous breathing trials but hold off extubation due to increased secretions   NEUROLOGIC A:   Seizure hx Acute encephalopathy ?  Related to seizure vs infection/ toxic/ metabolic - -history of non-compliance with AEDs/ resides in nursing home. MRI  -  Punctate right corona radiata area of acute infarction-likely incidental. No evidence of anoxic injury or any large structural lesions to explain current presentation.   P:   Per neuro, continue Keppra, valproate and carbamazepine Not sure at this time how to  address issue of noncompliance in outpatient setting Use fentanyl as needed for agitation with goal RA SS 0 Ativan only for seizure   INFECTIOUS A:   MRSA PCR + 06/27/2018 Coag neg stph blood 06/25/2018 E. Coli bacteremia 06/25/2018 Klebsiella urine 06/25/2018  - chronic foley from SNF P:   Merrem as above, unclear source -favor urinary although not isolated at this time Repeat respiratory culture   RENAL A:  Chronic foley. Normal creat P:  Mild hypomagnesemia will be repleted   GASTROINTESTINAL A:   NPO  P:   TF PPI  HEMATOLOGIC A:  Anemia chronic and critical illness   P:  - PRBC for hgb </= 6.9gm%        Best practice:  Diet: TF at goal per dietitian  Pain/Anxiety/Delirium protocol (if indicated): holding currently, prn propofol/ fentanyl VAP protocol (if indicated): yes DVT prophylaxis: SCD GI prophylaxis: protonix Glucose control: SSI  Mobility: BR, seizure precautions  Code Status: Full  Family Communication: 06/29/2018 - wife Lee [name] updated. Wife reports - goal is to gt his regular medicine and back to pre-admit baseline and be able to speak. Full code for now  Disposition: ICU  The patient is critically ill with multiple organ systems failure and requires high complexity decision making for assessment and support, frequent evaluation and titration of therapies, application of advanced monitoring technologies and extensive interpretation of multiple databases. Critical Care Time devoted to patient care services described in this note independent of APP/resident  time is 32 minutes.   Kara Mead MD. Shade Flood. [facility] Pager [phone] If no response call [phone]    06/30/2018 11:28 AM

## 2018-06-30 NOTE — Consult Note (Signed)
[facility] Nurse wound consult note Patient receiving care in [facility].  Assisted with turning by his primary RN. Reason for Consult: DTI to sacrum/coccyx. Wound type: no wound present. Continue frequent turning, pericleansing for fecal incontinence. Thank you for the consult.  Discussed plan of care with the bedside nurse.  [facility] nurse will not follow at this time.  Please re-consult the [facility] team if needed.  Val Riles, RN, MSN, CWOCN, CNS-BC, pager [phone]

## 2018-06-30 NOTE — Progress Notes (Addendum)
NEUROLOGY PROGRESS NOTE  Subjective: Currently wakes up quickly with stimuli.  Intubated and intermittently breathing over the vent.  Will blink his eyes to how many fingers I am holding up.  Exam: Vitals:   06/30/18 0847 06/30/18 0900  BP:  128/88  Pulse:    Resp:  18  Temp: 98.3 F (36.8 C)   SpO2:      Physical Exam   HEENT-  Normocephalic, no lesions, without obvious abnormality.  Normal external eye and conjunctiva.   Extremities- Warm, dry and intact Musculoskeletal-increased tone with left greater than right Skin-warm and dry, left foot has padding due to resting ulcers    Neuro:  Mental Status: Alert, intubated, follow simple commands such as following my finger, blinking to how many fingers I have held up, able enough to start to push me away with his right arm and when asked if he does not want me to be there he does shake his head yes Cranial Nerves: II:  Visual fields grossly normal,  III,IV, VI: ptosis not present, extra-ocular motions intact bilaterally pupils equal, round, reactive to light and accommodation V,VII: Face symmetric, facial light touch sensation normal bilaterally VIII: hearing normal bilaterally  Motor: Right arm able to move antigravity, bilateral legs with noxious stimuli at the plantar surfaces able to withdraw antigravity.  Left arm has significant tone and does not move antigravity Sensory: Intact to pinprick in all extremities Deep Tendon Reflexes: No deep tendon reflexes noted Plantars: Right: downgoing   Left: downgoing  -Not on propofol during exam  Medications:  Scheduled: . carbamazepine  300 mg Per Tube BID  . chlorhexidine gluconate (MEDLINE KIT)  15 mL Mouth Rinse BID  . Chlorhexidine Gluconate Cloth  6 each Topical Q0600  . Chlorhexidine Gluconate Cloth  6 each Topical Daily  . Chlorhexidine Gluconate Cloth  6 each Topical Q0600  . clopidogrel  75 mg Per Tube Daily  . enoxaparin (LOVENOX) injection  40 mg Subcutaneous  Q24H  . feeding supplement (PRO-STAT SUGAR FREE 64)  60 mL Per Tube BID  . insulin aspart  0-15 Units Subcutaneous Q4H  . lacosamide  300 mg Per Tube BID  . levETIRAcetam  1,500 mg Per Tube BID  . mouth rinse  15 mL Mouth Rinse 10 times per day  . mupirocin ointment  1 application Nasal BID  . pantoprazole (PROTONIX) IV  40 mg Intravenous QHS  . sodium chloride flush  10-40 mL Intracatheter Q12H    Pertinent Labs/Diagnostics:   Mr Brain Wo Contrast  Result Date: 06/28/2018 CLINICAL DATA:  Encephalopathy EXAM: MRI HEAD WITHOUT CONTRAST TECHNIQUE: Multiplanar, multiecho pulse sequences of the brain and surrounding structures were obtained without intravenous contrast. COMPARISON:  CT head 06/25/2018 FINDINGS: Brain: Generalized atrophy. Ventricular enlargement due to atrophy. Negative for hydrocephalus. Small acute infarct in the right corona radiata. No other acute infarct. Chronic microvascular ischemic changes in the white matter and pons. Chronic microhemorrhage left occipital left parietal lobe. No mass or edema or midline shift. Vascular: Normal arterial flow voids. Skull and upper cervical spine: Negative Sinuses/Orbits: Mild mucosal edema paranasal sinuses.  Normal orbit Other: None IMPRESSION: Atrophy and chronic microvascular ischemic change in the white matter and pons Small subcentimeter acute infarct right corona radiata. Electronically Signed   By: Franchot Gallo M.D.   On: 06/28/2018 12:44   Dg Chest Port 1 View  Result Date: 06/30/2018 CLINICAL DATA:  Intubation. EXAM: PORTABLE CHEST 1 VIEW COMPARISON:  06/29/2018. FINDINGS: Endotracheal tube in stable position.  NG tube in stable position. Its side hole is at the gastroesophageal junction. Advancement of approximately 10 cm should be considered. Heart size normal. Persistent but slightly improved mild left base atelectasis/infiltrate again noted. No pleural effusion or pneumothorax. IMPRESSION: 1. Endotracheal tube in stable  position. NG tube noted in stable position. Side hole is at the gastroesophageal junction and advancement of approximately 10 cm should be considered. 2. Persistent but slightly improved left base atelectasis/infiltrate. Electronically Signed   By: Marcello Moores  Register   On: 06/30/2018 07:12   Dg Chest Port 1 View  Result Date: 06/29/2018 CLINICAL DATA:  Intubated EXAM: PORTABLE CHEST 1 VIEW COMPARISON:  06/26/2018 chest radiograph. FINDINGS: Endotracheal tube tip is 6.1 cm above the carina. Enteric tube terminates in the proximal stomach. Stable cardiomediastinal silhouette with normal heart size. No pneumothorax. No pleural effusion. Mild left basilar atelectasis. No pulmonary edema. IMPRESSION: 1. Well-positioned support structures. 2. Mild left basilar atelectasis. Electronically Signed   By: Ilona Sorrel M.D.   On: 06/29/2018 09:47     Etta Quill PA-C [facility] [phone]   Assessment: 68 year old male with history of strokes, seizures, who is a resident of a memory care facility.  Is reported that he is noncompliant with his medications and refusing his medications.  Normally his wife is able to help with his compliance, but SNF staff was unable to get his compliance.  Thus due to lack of maintenance medications he became very rigid from Parkinson's and brought with breakthrough seizures.  In addition it did show that he was bacteremic which would lower his seizure threshold.  Long-term EEG for 2 days showed left temporal epileptiform discharges which were present on his prior EEGs as well and likely his baseline.  No ongoing seizure activity on EEG which was stopped on 6/19  Impression:  1- encephalopathy which is likely multifactorial and due to postictal state along with bacteremia and poor neurological baseline.  At this point time he seems to be more alert.  2-breakthrough seizures in the setting of toxic metabolic derangement/bacteremia and medication noncompliance  3-  Punctate right corona radiata infarction on MRI brain -likely incidental  Recommendations: -Continue antiepileptic medications _ keppra 1.5 g BID, Tegretol '300mg'$  BID, Vimpat '300mg'$  BID and Valproic acid '750mg'$  BID - Limit sedation, wean vent and extubate when able per PCCM. -Treat bacteremia per PCCM -Supportive care and treatment of all medical underlying issues.     06/30/2018, 10:13 AM  NEUROHOSPITALIST ADDENDUM Performed a face to face diagnostic evaluation.   I have reviewed the contents of history and physical exam as documented by PA/ARNP/Resident and agree with above documentation.  I have discussed and formulated the above plan as documented. Edits to the note have been made as needed.  Patient awake, not cooperative on exam.  Continue AEDs.  Wean sedation and extubate when possible.    Karena Addison Felissa Blouch MD [facility] [phone]   If 7pm to 7am, please call on call as listed on AMION.

## 2018-07-01 ENCOUNTER — Other Ambulatory Visit: Payer: Self-pay

## 2018-07-01 ENCOUNTER — Inpatient Hospital Stay (HOSPITAL_COMMUNITY): Payer: Medicare Other

## 2018-07-01 DIAGNOSIS — R7881 Bacteremia: Secondary | ICD-10-CM

## 2018-07-01 LAB — CBC WITH DIFFERENTIAL/PLATELET
Abs Immature Granulocytes: 0.04 10*3/uL (ref 0.00–0.07)
Basophils Absolute: 0.1 10*3/uL (ref 0.0–0.1)
Basophils Relative: 1 %
Eosinophils Absolute: 0.2 10*3/uL (ref 0.0–0.5)
Eosinophils Relative: 2 %
HCT: 37.2 % — ABNORMAL LOW (ref 39.0–52.0)
Hemoglobin: 12.5 g/dL — ABNORMAL LOW (ref 13.0–17.0)
Immature Granulocytes: 0 %
Lymphocytes Relative: 24 %
Lymphs Abs: 2.3 10*3/uL (ref 0.7–4.0)
MCH: 32.1 pg (ref 26.0–34.0)
MCHC: 33.6 g/dL (ref 30.0–36.0)
MCV: 95.6 fL (ref 80.0–100.0)
Monocytes Absolute: 0.9 10*3/uL (ref 0.1–1.0)
Monocytes Relative: 10 %
Neutro Abs: 6.1 10*3/uL (ref 1.7–7.7)
Neutrophils Relative %: 63 %
Platelets: 364 10*3/uL (ref 150–400)
RBC: 3.89 MIL/uL — ABNORMAL LOW (ref 4.22–5.81)
RDW: 15.1 % (ref 11.5–15.5)
WBC: 9.7 10*3/uL (ref 4.0–10.5)
nRBC: 0 % (ref 0.0–0.2)

## 2018-07-01 LAB — BASIC METABOLIC PANEL
Anion gap: 9 (ref 5–15)
BUN: 10 mg/dL (ref 8–23)
CO2: 25 mmol/L (ref 22–32)
Calcium: 8.7 mg/dL — ABNORMAL LOW (ref 8.9–10.3)
Chloride: 104 mmol/L (ref 98–111)
Creatinine, Ser: 0.59 mg/dL — ABNORMAL LOW (ref 0.61–1.24)
GFR calc Af Amer: >60 mL/min (ref >60)
GFR calc non Af Amer: >60 mL/min (ref >60)
Glucose, Bld: 71 mg/dL (ref 70–99)
Potassium: 3.8 mmol/L (ref 3.5–5.1)
Sodium: 138 mmol/L (ref 135–145)

## 2018-07-01 LAB — MAGNESIUM: Magnesium: 1.9 mg/dL (ref 1.7–2.4)

## 2018-07-01 LAB — GLUCOSE, CAPILLARY
Glucose-Capillary: 122 mg/dL — ABNORMAL HIGH (ref 70–99)
Glucose-Capillary: 88 mg/dL (ref 70–99)
Glucose-Capillary: 89 mg/dL (ref 70–99)
Glucose-Capillary: 96 mg/dL (ref 70–99)
Glucose-Capillary: 84 mg/dL (ref 70–99)
Glucose-Capillary: 86 mg/dL (ref 70–99)

## 2018-07-01 LAB — PHOSPHORUS: Phosphorus: 3.3 mg/dL (ref 2.5–4.6)

## 2018-07-01 NOTE — Evaluation (Signed)
Physical Therapy Evaluation Patient Details Name: Lee [name] MRN: [id number] DOB: [birth year] Today's Date: 07/01/2018   History of Present Illness  68 yo M w/ dementia, seizures, presents 6/17 following witnessed 10-15 minute GTC seizure from nursing home.  Intubated for airway protection.  SNF since 2017 per wife - due to dementia and wandering off.  Clinical Impression  Pt admitted with/for seizure.  Now with significant assist needs for basic mobility and gait.[name]  Pt currently limited functionally due to the problems listed. ( See problems list.)   Pt will benefit from PT to maximize function and safety in order to get ready for next venue listed below.     Follow Up Recommendations SNF    Equipment Recommendations  Other (comment)(TBA by therapist or restorative nursing at the SNF)    Recommendations for Other Services       Precautions / Restrictions Precautions Precautions: Fall      Mobility  Bed Mobility Overal bed mobility: Needs Assistance Bed Mobility: Rolling;Sidelying to Sit Rolling: Mod assist Sidelying to sit: Mod assist       General bed mobility comments: assist /cues given slowly to allow pt to assist with R UE  Transfers Overall transfer level: Needs assistance   Transfers: Sit to/from Stand;Stand Pivot Transfers Sit to Stand: Max assist Stand pivot transfers: Mod assist;Max assist       General transfer comment: pt unable to stand fully upright and unable to take pivotal steps without the w/shift and supportive assist.  Ambulation/Gait             General Gait Details: unable today  Stairs            Wheelchair Mobility    Modified Rankin (Stroke Patients Only)       Balance Overall balance assessment: Needs assistance Sitting-balance support: Single extremity supported;Bilateral upper extremity supported Sitting balance-Leahy Scale: Poor Sitting balance - Comments: needed stability assist or listed posteriorly and  to the right   Standing balance support: Bilateral upper extremity supported Standing balance-Leahy Scale: Poor Standing balance comment: face to face assist with pt huggint the therapist.                             Pertinent Vitals/Pain Pain Assessment: Faces Faces Pain Scale: No hurt    Home Living Family/patient expects to be discharged to:: Skilled nursing facility                 Additional Comments: Per chart review, pt is long term resident of [facility].      Prior Function Level of Independence: Needs assistance   Gait / Transfers Assistance Needed: spouse reports he stays in a w/c all day, non-ambulatory.  pt reports he walks, takes his own shower, goes to the BR by himself.  ADL's / Homemaking Assistance Needed: RN staff assist with dressing/bathin, can feed self once set up        Hand Dominance   Dominant Hand: Right    Extremity/Trunk Assessment   Upper Extremity Assessment Upper Extremity Assessment: Generalized weakness;Defer to OT evaluation(uncoordinated)    Lower Extremity Assessment Lower Extremity Assessment: Generalized weakness       Communication   Communication: Expressive difficulties  Cognition Arousal/Alertness: Awake/alert Behavior During Therapy: WFL for tasks assessed/performed Overall Cognitive Status: History of cognitive impairments - at baseline Area of Impairment: Following commands  Current Attention Level: Sustained   Following Commands: Follows one step commands consistently;Follows one step commands with increased time              General Comments      Exercises     Assessment/Plan    PT Assessment Patient needs continued PT services  PT Problem List Decreased strength;Decreased activity tolerance;Decreased mobility;Decreased balance;Decreased coordination;Decreased cognition       PT Treatment Interventions Gait training;Functional mobility training;Therapeutic  activities;Balance training;DME instruction;Patient/family education;Neuromuscular re-education    PT Goals (Current goals can be found in the Care Plan section)  Acute Rehab PT Goals Patient Stated Goal: pt unable to state. PT Goal Formulation: Patient unable to participate in goal setting Time For Goal Achievement: 07/15/18 Potential to Achieve Goals: Fair    Frequency Min 2X/week   Barriers to discharge        Co-evaluation               AM-PAC PT "6 Clicks" Mobility  Outcome Measure Help needed turning from your back to your side while in a flat bed without using bedrails?: A Lot Help needed moving from lying on your back to sitting on the side of a flat bed without using bedrails?: A Lot Help needed moving to and from a bed to a chair (including a wheelchair)?: Total Help needed standing up from a chair using your arms (e.g., wheelchair or bedside chair)?: Total Help needed to walk in hospital room?: Total Help needed climbing 3-5 steps with a railing? : Total 6 Click Score: 8    End of Session   Activity Tolerance: Patient tolerated treatment well;Patient limited by lethargy Patient left: in chair;with call bell/phone within reach;with chair alarm set Nurse Communication: Mobility status PT Visit Diagnosis: Unsteadiness on feet (R26.81);Other abnormalities of gait and mobility (R26.89);Muscle weakness (generalized) (M62.81);Difficulty in walking, not elsewhere classified (R26.2)    Time: 7829-5621 PT Time Calculation (min) (ACUTE ONLY): 31 min   Charges:   PT Evaluation $PT Eval Moderate Complexity: 1 Mod PT Treatments $Therapeutic Activity: 8-22 mins        07/01/2018  Green Bing, PT Acute Rehabilitation Services [phone]  (pager) [phone]  (office)  Eliseo Gum Marchetta Navratil 07/01/2018, 5:40 PM
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
## 2018-07-01 NOTE — Progress Notes (Signed)
Updated pt wife about pt progression since off of vent. Answered all questions and reassured Mrs. Schlafer that I would let her know when he is assigned to another room. Mrs. Rogan expressed gratitude for the update and the care Mr. Eberwein has received. Will continue to monitor.

## 2018-07-01 NOTE — Progress Notes (Signed)
Reason for consult: status epilepticus  Subjective: Patient is awake and following commands. Self extubated overnight.     ROS: negative except above Examination  Vital signs in last 24 hours: Temp:  [98.1 F (36.7 C)-98.5 F (36.9 C)] 98.3 F (36.8 C) (06/23 1232) Pulse Rate:  [72-87] 72 (06/23 0800) Resp:  [12-22] 20 (06/23 1300) BP: (106-178)/(61-142) 142/93 (06/23 1300) SpO2:  [94 %-100 %] 94 % (06/23 1100) FiO2 (%):  [30 %] 30 % (06/22 1442) Weight:  [62.8 kg] 62.8 kg (06/23 0500)  General: lying in bed CVS: pulse-normal rate and rhythm RS: breathing comfortably Extremities: normal   Neuro:  MS: Alert, oriented to himself and follows commands CN: pupils equal and reactive,  EOMI, face symmetric, tongue midline, normal sensation over face, Motor: 4/5 strength in bilateral upper extremities, withdraws to pain in both LE Reflexes: plantars: flexor Coordination: UTA Gait: not tested  Basic Metabolic Panel: Recent Labs  Lab 06/27/18 0341 06/27/18 1102 06/27/18 1619 06/28/18 0537 06/29/18 0359 06/30/18 0436 07/01/18 0237  NA  --  140  --  138 138 136 138  K  --  3.7  --  4.1 3.8 3.7 3.8  CL  --  105  --  107 105 104 104  CO2  --  24  --  21* 25 24 25   GLUCOSE  --  95  --  115* 116* 129* 71  BUN  --  7*  --  12 12 12 10   CREATININE  --  0.66  --  0.54* 0.49* 0.50* 0.59*  CALCIUM  --  8.6*  --  8.4* 8.7* 8.5* 8.7*  MG 1.8  --  2.2  --  1.7 1.8 1.9  PHOS 3.0  --  3.6  --  3.1 3.0 3.3    CBC: Recent Labs  Lab 06/25/18 0639  06/27/18 1102 06/28/18 0537 06/29/18 0359 06/30/18 0436 07/01/18 0237  WBC 23.7*   < > 11.5* 8.1 7.3 9.3 9.7  NEUTROABS 21.7*  --   --   --  4.7 6.6 6.1  HGB 14.5   < > 12.3* 12.0* 11.7* 12.6* 12.5*  HCT 44.6   < > 36.2* 35.0* 34.7* 37.7* 37.2*  MCV 98.0   < > 93.8 94.3 94.8 94.7 95.6  PLT 433*   < > 299 320 295 373 364   < > = values in this interval not displayed.     Coagulation Studies: Recent Labs    06/30/18 0436   LABPROT 14.2  INR 1.1    Imaging Reviewed:     ASSESSMENT AND PLAN  68 year old male with history of strokes, seizures, who is a resident of a memory care facility.  Is reported that he is noncompliant with his medications and refusing his medications.  Normally his wife is able to help with his compliance, but SNF staff was unable to get his compliance.  Thus due to lack of maintenance medications he became very rigid from Parkinson's and brought with breakthrough seizures.  In addition it did show that he was bacteremic which would lower his seizure threshold.  Long-term EEG for 2 days showed left temporal epileptiform discharges which were present on his prior EEGs as well and likely his baseline.  No ongoing seizure activity on EEG which was stopped on 6/19.  Patient self extubated on 6/22 and is awake and following commands. No further seizures.   Impression:   Refractory epilepsy with breakthrough seizures in the setting of toxic metabolic derangement/bacteremia and  medication noncompliance  Encephalopathy which is likely multifactorial and due to postictal state along with bacteremia and poor neurological baseline.   Punctate right corona radiata infarction on MRI brain -likely incidental  Recommendations: -Continue antiepileptic medications _ keppra 1.5 g BID, Tegretol 300mg  BID, Vimpat 300mg  BID and Valproic acid 750mg  BID. -[facility]   Neurology will be available as needed.    Georgiana SpinnerSushanth Dorean Daniello [facility] Pager Number [phone] For questions after 7pm please refer to AMION to reach the Neurologist on call

## 2018-07-01 NOTE — Progress Notes (Signed)
NAME:  Lee [name], MRN:  [id number], DOB:  07/24/1950, LOS: 6 ADMISSION DATE:  06/25/2018, CONSULTATION DATE:  06/25/18  REFERRING MD: [facility] - EM  , CHIEF COMPLAINT:  Seizure disorder, acute respiratory failure    Brief History   68 yo M w/ dementia, seizures, presents 6/17 following witnessed 10-15 minute GTC seizure from nursing home.  Intubated for airway protection.  SNF since 2017 per wife - due to dementia and wandering off. Pr wife prior to admit - SNF was not giving medicine prior to admit  Dementia - could communicate to wife. Could speak full sentences as of last week .  Does have diapers. He can sit by self and walk a bit   Past Medical History  CVA Dementia Seizure disorder HTN  Significant Hospital Events   6/17 admitted  6/17 -  6/19 on LTM  + propofol   Consults:  Neurology   Procedures:  EEG 6/17 >>> Slowing and few left temporoparietal epileptiform discharges.  No evidence of ongoing seizures. LTM 6/18 >> no evidence of SE, intermittent epileptiform discharges in the right anterior temporal region suggesting risk of focal epileptognenic city, otherwise generalized polymorphic delta activity suggesting severe encephalopathy but medication effect can not be excluded.  ECHO June 28, 2018 - ef 65% with impared dias dysfn ETT 6/17 >>6/22   Significant Diagnostic Tests:  CT Head non-con 6/17 > stable white matter changes and atrophy from prior. No evidence of acute infarction, hemorrhage or mass lesion. Remote appearing lacunar infarct L posterior inferior cerebellum    6/20 MRI brain -Punctate right corona radiata area of acute infarction-likely incidental.No evidence of anoxic injury or any large structural lesions to explain current presentation.  Micro Data:  6/17 SARS Cov2 - not detected 6/17 BCx >> e.coli >> multi-resistant (including ceftriaxone) 6/17 UCx >> 40K colonies GNR >> klebsiella  6/17 Tracheal aspirate>>  6/19 MRSA PCR - positive 6/22  blood >> 6/22 resp >>     Antimicrobials:  Vanc 6/17  Cefepime 6/17 Flagyl 6/17 6/17 ceftriaxone >>6/19 6/19 meropenum >>  Interim history/subjective:   Self extubated yesterday and did okay Afebrile Denies chest pain or dyspnea "Can I have medications.....[name]. for sex?"   Objective   Blood pressure (!) 141/77, pulse 72, temperature 98.2 F (36.8 C), temperature source Oral, resp. rate 13, height 5\' 6"  (1.676 m), weight 62.8 kg, SpO2 98 %.    Vent Mode: PRVC FiO2 (%):  [30 %] 30 % Set Rate:  [18 bmp] 18 bmp Vt Set:  [510 mL] 510 mL PEEP:  [5 cmH20] 5 cmH20 Plateau Pressure:  [13 cmH20-15 cmH20] 13 cmH20   Intake/Output Summary (Last 24 hours) at 07/01/2018 [id number] Last data filed at 07/01/2018 0500 Gross per 24 hour  Intake 455.74 ml  Output 975 ml  Net -519.26 ml   Filed Weights   06/29/18 0500 06/30/18 0500 07/01/18 0500  Weight: 60.6 kg 60.5 kg 62.8 kg    Elderly man,  no distress Mild pallor, no icterus, no JVD No rhonchi, decreased bilateral S1-S2 regular, no murmur Soft nontender abdomen Moves all 4 extremities, confused, [place] able [place] and [place], interactive, nonfocal No edema  Chest x-ray 6/22 personally reviewed, basal infiltrate on right 6/23 appears clear    Resolved Hospital Problem list    Assessment & Plan:   ASSESSMENT / PLAN:  PULMONARY A:  Acute resp failure due to status and acute enceph  P:   Self  extubated 6/22 and seems to be doing well   NEUROLOGIC A:   Seizure hx Acute encephalopathy ? Related to seizure vs infection/ toxic/ metabolic - -history of non-compliance with AEDs/ resides in nursing home. MRI  -  Punctate right corona radiata area of acute infarction-likely incidental. No evidence of anoxic injury or any large structural lesions to explain current presentation.   P:   Per neuro, continue Keppra, valproate, vimpat and carbamazepine Not sure at this time how to address issue of  noncompliance in outpatient setting Can his regimen be simplified? PT evaluation   INFECTIOUS A:   MRSA PCR + 06/27/2018 Coag neg stph blood 06/25/2018 E. Coli bacteremia 06/25/2018 Klebsiella urine 06/25/2018  - chronic foley from SNF P:   Merrem as above for 7 to 10 days since repeat culture on 6/22, unclear source -favor urinary although not isolated at this time Repeat respiratory culture   RENAL A:  Chronic foley. Normal creat P:  Mild hypomagnesemia will be repleted   GASTROINTESTINAL A:   NPO  P:   Swallow evaluation and advance   Transfer to floor and to [facility] 6/24    Best practice:  Diet: TF at goal per dietitian  Pain/Anxiety/Delirium protocol (if indicated): holding currently, prn propofol/ fentanyl VAP protocol (if indicated): yes DVT prophylaxis: SCD GI prophylaxis: protonix Glucose control: SSI  Mobility: BR, seizure precautions  Code Status: Full  Family Communication: 6/23- wife Lee [name] updated. Wife reports - goal is to gt his regular medicine and back to pre-admit baseline and be able to speak. Full code for now She states that memory care unit has a protocol to inform her if he does not take his medicine  Disposition: floor    Kara Mead MD. Shade Flood.  Pulmonary & Critical care Pager [phone] If no response call [phone]    07/01/2018 9:26 AM

## 2018-07-01 NOTE — Consult Note (Signed)
Modified Barium Swallow Progress Note  Patient Details  Name: Lee [name] MRN: [id number] Date of Birth: April 16, 1950  Today's Date: 07/01/2018  Modified Barium Swallow completed.  Full report located under Chart Review in the Imaging Section.  Brief recommendations include the following:  Clinical Impression  Pt with decreased oral transit/propulsion with puree/solids; delay to valleculae noted with all consistencies (may be related to presbyphagia) with only incident of penetration noted with thin via straw; pt also exhibited vallecular residue with puree/solids, but these were cleared from vallecular space with independent subsequent swallows.  No aspiration noted during this study.   Swallow Evaluation Recommendations       SLP Diet Recommendations: Dysphagia 3 (Mech soft) solids;Thin liquid   Liquid Administration via: Cup;No straw   Medication Administration: Crushed with puree   Supervision: Patient able to self feed;Full supervision/cueing for compensatory strategies;Staff to assist with self feeding   Compensations: Minimize environmental distractions;Slow rate;Small sips/bites   Postural Changes: Seated upright at 90 degrees   Oral Care Recommendations: Oral care BID        Elvina Sidle, M.S., CCC-SLP 07/01/2018,3:17 PM

## 2018-07-01 NOTE — Consult Note (Signed)
[facility] Inpatient Consult   07/01/2018  Lee [name] May 16, 1950 [id number]    Patientreviewedfor 24% high risk score for unplanned readmission with 2 hospitalizations in the past 6 months, and tocheck if Archivist services- as benefit from [employer], however, notedthatpatientlives at [facility] (SNF) [facility].   Perchart review andbrief history of MD dated 06/29/18, states as follows: Patient is a 68 yo M w/ dementia, seizures, presents 6/17 following witnessed 10-15 minute GTC seizure from nursing home.  Intubated for airway protection. SNF since 2017 per wife - due to dementia and wandering off. Pr wife prior to admit - SNF was not giving medicine prior to admit.  Dementia - could communicate to wife. Could speak full sentences as of last week . Does have diapers. He can sit by self and walk a bit. History of CVA, Dementia, Seizure disorder, HTN. Self extubated- 6/22 and seems to be doing well.  Review of TOC LCSW note, states- will facilitate return back tothe skilled nursing facility once medically stable.   No current [facility] care management needed with a SNF (returning back to skilled nursing facility) discharge disposition.    For questions and additional information, please contact:  Greer Koeppen A. Allaya Abbasi, BSN, RN-BC [facility] Liaison Cell: [phone]

## 2018-07-02 ENCOUNTER — Encounter (HOSPITAL_COMMUNITY): Payer: Self-pay

## 2018-07-02 DIAGNOSIS — I10 Essential (primary) hypertension: Secondary | ICD-10-CM

## 2018-07-02 DIAGNOSIS — B9689 Other specified bacterial agents as the cause of diseases classified elsewhere: Secondary | ICD-10-CM

## 2018-07-02 LAB — CULTURE, RESPIRATORY W GRAM STAIN
Culture: NORMAL
Gram Stain: NONE SEEN

## 2018-07-02 LAB — GLUCOSE, CAPILLARY
Glucose-Capillary: 105 mg/dL — ABNORMAL HIGH (ref 70–99)
Glucose-Capillary: 114 mg/dL — ABNORMAL HIGH (ref 70–99)
Glucose-Capillary: 126 mg/dL — ABNORMAL HIGH (ref 70–99)
Glucose-Capillary: 67 mg/dL — ABNORMAL LOW (ref 70–99)
Glucose-Capillary: 68 mg/dL — ABNORMAL LOW (ref 70–99)
Glucose-Capillary: 81 mg/dL (ref 70–99)
Glucose-Capillary: 84 mg/dL (ref 70–99)
Glucose-Capillary: 87 mg/dL (ref 70–99)
Glucose-Capillary: 88 mg/dL (ref 70–99)

## 2018-07-02 LAB — C DIFFICILE QUICK SCREEN W PCR REFLEX
C Diff antigen: NEGATIVE
C Diff interpretation: NOT DETECTED
C Diff toxin: NEGATIVE

## 2018-07-02 LAB — VALPROIC ACID LEVEL: Valproic Acid Lvl: 26 ug/mL — ABNORMAL LOW (ref 50.0–100.0)

## 2018-07-02 MED ORDER — DEXTROSE 50 % IV SOLN: 25.0000 mL | Freq: Once | INTRAVENOUS | Status: DC

## 2018-07-02 MED ORDER — ENSURE ENLIVE PO LIQD
237.0000 mL | Freq: Two times a day (BID) | ORAL | Status: DC
  Administered 2018-07-02 – 2018-07-05 (×8): 237 mL via ORAL

## 2018-07-02 MED ORDER — ZINC OXIDE 40 % EX OINT
TOPICAL_OINTMENT | Freq: Every day | CUTANEOUS | Status: DC
  Administered 2018-07-02: 1 via TOPICAL
  Administered 2018-07-02: 22:00:00 via TOPICAL
  Administered 2018-07-02: 1 via TOPICAL
  Administered 2018-07-03 (×2): via TOPICAL
  Administered 2018-07-03: 1 via TOPICAL
  Administered 2018-07-03: 09:00:00 via TOPICAL
  Administered 2018-07-04: 1 via TOPICAL
  Administered 2018-07-04: 10:00:00 via TOPICAL
  Administered 2018-07-04: 1 via TOPICAL
  Administered 2018-07-05 – 2018-07-06 (×4): via TOPICAL
  Filled 2018-07-02 (×2): qty 57

## 2018-07-02 NOTE — Progress Notes (Signed)
Pt transferred with all belongings including watch. Wife notified of transfer.

## 2018-07-02 NOTE — Progress Notes (Signed)
Assisted tele visit to patient with wife.  Deira Shimer M Jermine Bibbee, RN   

## 2018-07-02 NOTE — Progress Notes (Signed)
Hypoglycemic Event  CBG: 68  Treatment: pt eating lunch, will reassess CBG after  Symptoms: none   Follow-up CBG: Time: 1230 CBG Result: 87   Possible Reasons for Event: decreased appetite       Arlys Scatena P Ahriana Gunkel

## 2018-07-02 NOTE — Progress Notes (Signed)
Nutrition Follow-up  DOCUMENTATION CODES:   Not applicable  INTERVENTION:   Ensure Enlive po BID, each supplement provides 350 kcal and 20 grams of protein  NUTRITION DIAGNOSIS:   Increased nutrient needs related to acute illness as evidenced by estimated needs.  Ongoing  GOAL:   Patient will meet greater than or equal to 90% of their needs  Progressing  MONITOR:   Diet advancement, Vent status, Skin, TF tolerance, Weight trends, Labs, I & O's  REASON FOR ASSESSMENT:   Consult Enteral/tube feeding initiation and management  ASSESSMENT:   Patient with PMH significant for CVA, COPD, ETOH abuse, PVD, dementia, seizure disorder, and HTN. Presents this admission s/p witnessed seizure.   6/22- self extubated   RD working remotely.  Attempted to call pt in room. No answer.   Pt had MBS yesterday- diet advanced to DYS 3 with thin liquids. Meal completions charted as 25-100% for pt's last two meals. In previous admission pt was given Ensure Enlive. Will trial this admission and attempt to speak with pt if possible to obtain preferences.Pt looks to have orange allergy. Will update in diet order.   Pt with loose stools. Being checked for C.diff. Will continue to monitor.   Admission weight: 66.5 kg Current weight 62.1 kg   I/O: +6,728 ml since admit UOP: 1,200 ml x 24 hrs   Medications: SS novolog Labs: CBG 67-126   Diet Order:   Diet Order            DIET DYS 3 Room service appropriate? Yes; Fluid consistency: Thin  Diet effective now              EDUCATION NEEDS:   Not appropriate for education at this time  Skin:  Skin Assessment: Reviewed RN Assessment  Last BM:  6/23  Height:   Ht Readings from Last 1 Encounters:  06/25/18 5\' 6"  (1.676 m)    Weight:   Wt Readings from Last 1 Encounters:  07/02/18 62.1 kg    Ideal Body Weight:  64.5 kg  BMI:  Body mass index is 22.1 kg/m.  Estimated Nutritional Needs:   Kcal:  1800-2000  kcal  Protein:  90-115 grams  Fluid:  >/= 1.8 L/day   Mariana Single RD, LDN Clinical Nutrition Pager # - [phone]

## 2018-07-02 NOTE — Progress Notes (Signed)
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 - Mech Soft Impaired mastication;Delayed oral transit;Decreased bolus cohesion Oral - Regular -- Oral - Multi-Consistency -- Oral - Pill -- Oral Phase - Comment --  CHL IP PHARYNGEAL PHASE 07/01/2018 Pharyngeal Phase Impaired Pharyngeal- Pudding Teaspoon -- Pharyngeal -- Pharyngeal- Pudding Cup -- Pharyngeal -- Pharyngeal- Honey Teaspoon -- Pharyngeal -- Pharyngeal- Honey Cup -- Pharyngeal -- Pharyngeal- Nectar Teaspoon -- Pharyngeal -- Pharyngeal- Nectar Cup -- Pharyngeal -- Pharyngeal- Nectar Straw -- Pharyngeal -- Pharyngeal- Thin Teaspoon Delayed swallow initiation-vallecula Pharyngeal -- Pharyngeal- Thin Cup Delayed swallow initiation-vallecula;Other (Comment)  Pharyngeal -- Pharyngeal- Thin Straw Delayed swallow initiation-vallecula;Penetration/Aspiration during swallow;Compensatory strategies attempted (with notebox) Pharyngeal Material enters airway, remains ABOVE vocal cords and not ejected out Pharyngeal- Puree Delayed swallow initiation-vallecula;Pharyngeal residue - valleculae Pharyngeal -- Pharyngeal- Mechanical Soft Delayed swallow initiation-vallecula;Pharyngeal residue - valleculae Pharyngeal -- Pharyngeal- Regular -- Pharyngeal -- Pharyngeal- Multi-consistency -- Pharyngeal -- Pharyngeal- Pill Other (Comment) Pharyngeal -- Pharyngeal Comment --  No flowsheet data found. Tressie StalkerPat Adams, M.S., CCC-SLP 07/01/2018, 3:13 PM                   Scheduled Meds: . carbamazepine  300 mg Per Tube BID  . Chlorhexidine Gluconate Cloth  6 each Topical Q0600  . Chlorhexidine Gluconate Cloth  6 each Topical Daily  . clopidogrel  75 mg Per Tube Daily  . dextrose  25 mL Intravenous Once  . enoxaparin (LOVENOX) injection  40 mg Subcutaneous Q24H  . insulin aspart  0-15 Units Subcutaneous Q4H  . mouth rinse  15 mL Mouth Rinse BID  . sodium chloride flush  10-40 mL Intracatheter Q12H   Continuous Infusions: . lacosamide (VIMPAT) IV Stopped (07/01/18 2247)  . lactated ringers Stopped (06/30/18 2156)  . levETIRAcetam Stopped (07/01/18 2035)  . meropenem (MERREM) IV 1 g (07/02/18 16100613)  . valproate sodium 750 mg (07/02/18 0604)     LOS: 7 days   The patient is critically ill with multiple organ systems failure and requires high complexity decision making for assessment and support, frequent evaluation and titration of therapies, application of advanced monitoring technologies and extensive interpretation of multiple databases. Critical Care Time devoted to patient care services described in this note  Time spent: 40 minutes     WOODS, Roselind MessierURTIS J, MD [facility] Pager [phone]  If 7PM-7AM, please contact night-coverage [email or web address] Password  [facility] [email or web address] 07/02/2018, 8:17 AM

## 2018-07-02 NOTE — Progress Notes (Signed)
Pt's wife called and updated

## 2018-07-02 NOTE — Progress Notes (Signed)
[facility] Progress Note Patient Name: Lee [name] DOB: [birth year] MRN: [id number]   Date of Service  07/02/2018  HPI/Events of Note  Pt with profuse diarrea  eICU Interventions  C_Diff screen ordered        Frederik Pear 07/02/2018, 1:27 AM

## 2018-07-02 NOTE — Progress Notes (Signed)
Speech Language Pathology Treatment: Dysphagia  Patient Details Name: Lee [name] MRN: [id number] DOB: 1950/04/11 Today's Date: 07/02/2018 Time: 4401-0272 SLP Time Calculation (min) (ACUTE ONLY): 10 min  Assessment / Plan / Recommendation Clinical Impression  Pt was drinking Ensure via straw upon SLP arrival, with delayed cough noted. Pt then drank water via cup with no overt signs of airway compromise. His mastication is prolonged given limited dentition, but even taking large bites at a time, his preparation appears appropriate and he consistently swallows before taking any additional bites. Min cues were provided to encourage smaller bites and to reinforce no straws. Additional written reminder was added to his whiteboard. Would continue current diet and precautions for now. This may remain the most appropriate diet longer-term given his limited dentition and cognitive status, but will f/u to better determine his potential to make any advancements.   HPI HPI: 68 yo M PMH known seizure disorder and dementia, previous CVA who resides at SNF. History is obtained by chart review, which discloses limited available history and history per EMS.       SLP Plan  Continue with current plan of care       Recommendations  Diet recommendations: Dysphagia 3 (mechanical soft);Thin liquid Liquids provided via: Cup;No straw Medication Administration: Crushed with puree Supervision: Patient able to self feed;Full supervision/cueing for compensatory strategies Compensations: Minimize environmental distractions;Slow rate;Small sips/bites Postural Changes and/or Swallow Maneuvers: Seated upright 90 degrees                Oral Care Recommendations: Oral care BID Follow up Recommendations: Skilled Nursing facility SLP Visit Diagnosis: Dysphagia, oropharyngeal phase (R13.12) Plan: Continue with current plan of care       GO                [name] 07/02/2018, 2:20 PM  [name], M.A. CCC-SLP Acute [name] [phone] Office [phone]

## 2018-07-02 NOTE — Progress Notes (Signed)
Inpatient Diabetes Program Recommendations  AACE/ADA: New Consensus Statement on Inpatient Glycemic Control (2015)  Target Ranges:  Prepandial:   less than 140 mg/dL      Peak postprandial:   less than 180 mg/dL (1-2 hours)      Critically ill patients:  140 - 180 mg/dL   Lab Results  Component Value Date   GLUCAP 87 07/02/2018   HGBA1C 6.1 (H) 11/07/2013    Review of Glycemic Control Results for [facility], PTACEK (MRN [id number]) as of 07/02/2018 13:49  Ref. Range 07/02/2018 04:55 07/02/2018 05:42 07/02/2018 08:23 07/02/2018 11:46 07/02/2018 12:28  Glucose-Capillary Latest Ref Range: 70 - 99 mg/dL 67 (L) 81 126 (H) 68 (L) 87   Diabetes history: None noted Current orders for Inpatient glycemic control:  Novolog moderate q 4 hours  Inpatient Diabetes Program Recommendations:    Please consider d/c of Novolog correction due to hypoglycemia.  Blood sugars currently <140 mg/dL.    Thanks,  Adah Perl, RN, BC-ADM Inpatient Diabetes Coordinator Pager [phone] (8a-5p)

## 2018-07-02 NOTE — Consult Note (Signed)
[facility] Nurse wound consult note Patient receiving care in [place]. Assisted with assessment by his primary RN Reason for Consult: injury on coccyx Wound type: MASD incontinence associated skin damage.  Patient is incontinent of loose stools and urine. Measurement: 1.2 cm x 2.2 cm Wound bed: pink Drainage (amount, consistency, odor) none Periwound: intact  Dressing procedure/placement/frequency: Apply 40% Desitin to coccyx, sacral area after providing pericare 5 times daily, and after each incontinent episode Monitor the wound area(s) for worsening of condition such as: Signs/symptoms of infection,  Increase in size,  Development of or worsening of odor, Development of pain, or increased pain at the affected locations.  Notify the medical team if any of these develop.  Thank you for the consult.  Discussed plan of care with the patient and bedside nurse.  [facility] nurse will not follow at this time.  Please re-consult the [facility] team if needed.  Val Riles, RN, MSN, CWOCN, CNS-BC, pager [phone]

## 2018-07-03 DIAGNOSIS — I6330 Cerebral infarction due to thrombosis of unspecified cerebral artery: Secondary | ICD-10-CM

## 2018-07-03 DIAGNOSIS — I7092 Chronic total occlusion of artery of the extremities: Secondary | ICD-10-CM

## 2018-07-03 DIAGNOSIS — F0281 Dementia in other diseases classified elsewhere with behavioral disturbance: Secondary | ICD-10-CM

## 2018-07-03 LAB — CBC
HCT: 35.8 % — ABNORMAL LOW (ref 39.0–52.0)
Hemoglobin: 12.2 g/dL — ABNORMAL LOW (ref 13.0–17.0)
MCH: 32.4 pg (ref 26.0–34.0)
MCHC: 34.1 g/dL (ref 30.0–36.0)
MCV: 95.2 fL (ref 80.0–100.0)
Platelets: 438 10*3/uL — ABNORMAL HIGH (ref 150–400)
RBC: 3.76 MIL/uL — ABNORMAL LOW (ref 4.22–5.81)
RDW: 14.8 % (ref 11.5–15.5)
WBC: 9.1 10*3/uL (ref 4.0–10.5)
nRBC: 0 % (ref 0.0–0.2)

## 2018-07-03 LAB — MAGNESIUM: Magnesium: 1.6 mg/dL — ABNORMAL LOW (ref 1.7–2.4)

## 2018-07-03 LAB — BASIC METABOLIC PANEL
Anion gap: 9 (ref 5–15)
BUN: 6 mg/dL — ABNORMAL LOW (ref 8–23)
CO2: 26 mmol/L (ref 22–32)
Calcium: 8.6 mg/dL — ABNORMAL LOW (ref 8.9–10.3)
Chloride: 104 mmol/L (ref 98–111)
Creatinine, Ser: 0.55 mg/dL — ABNORMAL LOW (ref 0.61–1.24)
GFR calc Af Amer: >60 mL/min (ref >60)
GFR calc non Af Amer: >60 mL/min (ref >60)
Glucose, Bld: 96 mg/dL (ref 70–99)
Potassium: 3.5 mmol/L (ref 3.5–5.1)
Sodium: 139 mmol/L (ref 135–145)

## 2018-07-03 LAB — GLUCOSE, CAPILLARY
Glucose-Capillary: 96 mg/dL (ref 70–99)
Glucose-Capillary: 83 mg/dL (ref 70–99)
Glucose-Capillary: 93 mg/dL (ref 70–99)
Glucose-Capillary: 107 mg/dL — ABNORMAL HIGH (ref 70–99)
Glucose-Capillary: 112 mg/dL — ABNORMAL HIGH (ref 70–99)
Glucose-Capillary: 94 mg/dL (ref 70–99)

## 2018-07-03 MED ORDER — MAGNESIUM SULFATE 2 GM/50ML IV SOLN
2.0000 g | Freq: Once | INTRAVENOUS | Status: AC
  Administered 2018-07-03: 2 g via INTRAVENOUS
  Filled 2018-07-03: qty 50

## 2018-07-03 MED ORDER — POTASSIUM CHLORIDE CRYS ER 20 MEQ PO TBCR
40.0000 meq | EXTENDED_RELEASE_TABLET | Freq: Once | ORAL | Status: AC
  Administered 2018-07-03: 40 meq via ORAL
  Filled 2018-07-03: qty 2

## 2018-07-03 NOTE — Progress Notes (Signed)
Occupational Therapy Evaluation Patient Details Name: Lee [name] MRN: [id number] DOB: Oct 16, 1950 Today's Date: 07/03/2018    History of Present Illness 68 yo M w/ dementia, seizures, presents 6/17 following witnessed 10-15 minute GTC seizure from nursing home.  Intubated for airway protection.  SNF since 2017 per wife - due to dementia and wandering off.   Clinical Impression   Per chart PTA, pt was residing at [facility] and required assistance with ADL/IADL and functional mobility using w/c. Pt currently requires modA for UB ADL and maxA-totalA for LB ADL. Pt requires maxA+2 for sit<>stand transfer in preparation for toilet transfer. Pt limited this date secondary to pain and fatigue. Due to decline in current level of function, pt would benefit from acute OT to address established goals to facilitate safe D/C to venue listed below. At this time, recommend SNF follow-up. Will continue to follow acutely.     Follow Up Recommendations  SNF;Supervision/Assistance - 24 hour    Equipment Recommendations  3 in 1 bedside commode;Hospital bed    Recommendations for Other Services       Precautions / Restrictions Precautions Precautions: Fall Restrictions Weight Bearing Restrictions: No      Mobility Bed Mobility Overal bed mobility: Needs Assistance Bed Mobility: Supine to Sit Rolling: Mod assist Sidelying to sit: Mod assist;HOB elevated       General bed mobility comments: Assist to bring legs off. elevate trunk into sitting and bring hips to EOB.  Transfers Overall transfer level: Needs assistance Equipment used: 2 person hand held assist Transfers: Sit to/from [employer] Sit to Stand: Max assist;+2 physical assistance Stand pivot transfers: Max assist;+2 physical assistance       General transfer comment: Assist to bring hips up and for balance. deferred transfer secondary to pt reports of pain and fatigue    Balance Overall balance  assessment: Needs assistance Sitting-balance support: Bilateral upper extremity supported Sitting balance-Leahy Scale: Poor Sitting balance - Comments: min assist to maintain sitting EOB Postural control: Posterior lean Standing balance support: Bilateral upper extremity supported Standing balance-Leahy Scale: Zero Standing balance comment: +2 max assist to stand                           ADL either performed or assessed with clinical judgement   ADL Overall ADL's : Needs assistance/impaired Eating/Feeding: Set up;Sitting   Grooming: Set up;Sitting   Upper Body Bathing: Moderate assistance;Sitting   Lower Body Bathing: Maximal assistance;Sit to/from stand;+2 for physical assistance   Upper Body Dressing : Maximal assistance Upper Body Dressing Details (indicate cue type and reason): maxA to don gown  Lower Body Dressing: Sit to/from stand;Total assistance;Maximal assistance;+2 for physical assistance Lower Body Dressing Details (indicate cue type and reason): maxA+2 to stand from EOB Toilet Transfer: Total assistance   Toileting- Clothing Manipulation and Hygiene: Total assistance Toileting - Clothing Manipulation Details (indicate cue type and reason): NT present during session;pt soiled in urine and stool upon arrival;pt rolled R<>L to complete pericare     Functional mobility during ADLs: Maximal assistance;Total assistance;+2 for physical assistance       Vision         Perception     Praxis      Pertinent Vitals/Pain Pain Assessment: 0-10 Pain Score: 9  Faces Pain Scale: No hurt Pain Location: bilateral legs Pain Descriptors / Indicators: Discomfort;Grimacing;Moaning Pain Intervention(s): Limited activity within patient's tolerance;Monitored during session;Repositioned     Hand Dominance Right  Extremity/Trunk Assessment Upper Extremity Assessment Upper Extremity Assessment: RUE deficits/detail;LUE deficits/detail RUE Deficits / Details:  shoulder flexion ~110 PROM;elbow flexion/extension full;strength grossly 2/5 RUE Sensation: WNL RUE Coordination: decreased fine motor;decreased gross motor LUE Deficits / Details: shoulder flexion ~100 PROM;elbow flexion full;elbow extension -10degrees;strength grossly 2/5 LUE Sensation: WNL LUE Coordination: decreased gross motor;decreased fine motor   Lower Extremity Assessment Lower Extremity Assessment: Defer to PT evaluation   Cervical / Trunk Assessment Cervical / Trunk Assessment: Kyphotic   Communication Communication Communication: Expressive difficulties   Cognition Arousal/Alertness: Awake/alert Behavior During Therapy: Flat affect Overall Cognitive Status: History of cognitive impairments - at baseline Area of Impairment: Following commands                   Current Attention Level: Sustained   Following Commands: Follows one step commands consistently;Follows one step commands with increased time       General Comments: pt appears to say "yes" to majority of yes/no questions   General Comments  upon arrival, condom catheter off of pt, pt sitting in soiled linens;notified RN and NT. NT arrived to assist with pericare and linen change;    Exercises     Shoulder Instructions      Home Living Family/patient expects to be discharged to:: Skilled nursing facility                                 Additional Comments: Per chart review, pt is long term resident of [address]        Prior Functioning/Environment Level of Independence: Needs assistance  Gait / Transfers Assistance Needed: per chart pt stays in a w/c all day, non-ambulatory.   ADL's / Homemaking Assistance Needed: per chart, RN staff assist with dressing/bathin, can feed self once set up   Comments: unsure details of pt's PLOF        OT Problem List: Decreased strength;Decreased range of motion;Decreased activity tolerance;Impaired balance (sitting and/or  standing);Decreased cognition;Decreased safety awareness;Decreased knowledge of use of DME or AE;Impaired tone;Impaired UE functional use;Pain      OT Treatment/Interventions: Self-care/ADL training;Therapeutic exercise;Energy conservation;DME and/or AE instruction;Modalities;Therapeutic activities;Cognitive remediation/compensation;Patient/family education;Balance training    OT Goals(Current goals can be found in the care plan section) Acute Rehab OT Goals Patient Stated Goal: pt unable to state. OT Goal Formulation: Patient unable to participate in goal setting Time For Goal Achievement: 07/17/18 Potential to Achieve Goals: Fair ADL Goals Pt Will Perform Eating: with modified independence Pt Will Perform Grooming: with modified independence Pt Will Perform Upper Body Bathing: with set-up Pt Will Transfer to Toilet: with min guard assist;ambulating  OT Frequency: Min 2X/week   Barriers to D/C:            Co-evaluation              AM-PAC OT "6 Clicks" Daily Activity     Outcome Measure Help from another person eating meals?: A Little Help from another person taking care of personal grooming?: A Little Help from another person toileting, which includes using toliet, bedpan, or urinal?: Total Help from another person bathing (including washing, rinsing, drying)?: A Lot Help from another person to put on and taking off regular upper body clothing?: A Lot Help from another person to put on and taking off regular lower body clothing?: Total 6 Click Score: 12   End of Session Equipment Utilized During Treatment: Gait belt Nurse Communication: Mobility status;Patient requests pain  meds  Activity Tolerance: Patient limited by pain;Patient limited by fatigue;Patient tolerated treatment well Patient left: in bed;with call bell/phone within reach;with bed alarm set  OT Visit Diagnosis: Unsteadiness on feet (R26.81);Other abnormalities of gait and mobility (R26.89);Muscle weakness  (generalized) (M62.81);Pain;Other symptoms and signs involving cognitive function Pain - Right/Left: (bilateral ) Pain - part of body: Leg                Time: 1349-1415 OT Time Calculation (min): 26 min Charges:  OT General Charges $OT Visit: 1 Visit OT Evaluation $OT Eval Moderate Complexity: 1 Mod OT Treatments $Self Care/Home Management : 8-22 mins  Diona Browner OTR/L Acute Rehabilitation Services Office: [phone]   Rebeca Alert 07/03/2018, 2:31 PM

## 2018-07-03 NOTE — Progress Notes (Addendum)
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 - Improved - PT/OT: Recommend return to SNF  Seizures -Per patient taking AEDs however per notes patient not compliant with AEDs. -Carbamazepine 300 mg twice daily - Vimpat 50 mg twice daily - Keppra 1500 mg twice daily - Valproate 750 mg every 8 hours - Neurology signed off on 6/23   Bacteremia positive E. coli (ESBL)/positive coag negative staph -Complete 7-day course antibiotics  UTI positive Klebsiella pneumoniae - Has completed course  of antibiotics   Chronic urinary retention -   Patient with chronic Foley from  [facility].  Change prior to discharge to CIR or back to SNF  Hypomagnesmia -Magnesium goal> 2 - Magnesium IV 2 g  Hypokalemia - Potassium goal> 4 -Potassium p.o. 40 mEq      Goals of care - 6/25 coronavirus lab for SNF drawn - At completion of antibiotics in 48 hours will return patient to [facility] SNF   DVT prophylaxis: Lovenox 40 mg daily Code Status: Full Family Communication: None Disposition Plan: TBD   Consultants:  Neurology PCCM   Procedures/Significant Events:  6/23 2-day EEG: Left temporal epileptiform discharge likely his baseline.  No ongoing seizure activity which was stopped on 6/19     I have personally reviewed and interpreted all radiology studies and my findings are as above.  VENTILATOR SETTINGS:      Cultures 6/17 SARS Cov2 - not detected 6/17 BCx >> e.coli >> multi-resistant (including ceftriaxone) 6/17 UCx >> 40K colonies GNR >> klebsiella  6/17 Tracheal aspirate>>  6/19 MRSA PCR - positive 6/22 blood >> 6/22 resp >> 6/25 coronavirus for SNF pending   Antimicrobials: Anti-infectives (From admission, onward)   Start     Stop   06/27/18 1400  meropenem (MERREM) 1 g in sodium chloride 0.9 % 100 mL IVPB  Status:  Discontinued     06/27/18 0949   06/27/18 1000  meropenem (MERREM) 1 g in sodium chloride 0.9 % 100 mL IVPB         06/27/18 0945  piperacillin-tazobactam (ZOSYN) IVPB 3.375 g  Status:  Discontinued     06/27/18 9562  PROGRESS NOTE    Lee [name]  WUJ:[id number] DOB: 01/09/1950 DOA: 06/25/2018 PCP: Mirna Mires, MD   Brief Narrative:  68 yo BM PMHx seizure disorder and dementia, CVA, HTN,PAD.PVD, chronic airway obstruction, who resides at [facility]. History is obtained by chart review, which discloses limited available history and history per EMS.    6/17 the patient was witnessed to sustain 10-15 min seizure by staff at living facility, reported tonic-clonic in nature. Per EMS evaluation, he exhibited minimal "twitching" and was subsequently given 2.5mg  Versed. Per ED report, patient had stopped seizing by time or arrival but was assess to be obtunded and not protecting airway, and was subsequently intubated.        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 - Improved - PT/OT: Recommend return to SNF  Seizures -Per patient taking AEDs however per notes patient not compliant with AEDs. -Carbamazepine 300 mg twice daily - Vimpat 50 mg twice daily - Keppra 1500 mg twice daily - Valproate 750 mg every 8 hours - Neurology signed off on 6/23   Bacteremia positive E. coli (ESBL)/positive coag negative staph -Complete 7-day course antibiotics  UTI positive Klebsiella pneumoniae - Has completed course  of antibiotics   Chronic urinary retention -   Patient with chronic Foley from  [facility].  Change prior to discharge to CIR or back to SNF  Hypomagnesmia -Magnesium goal> 2 - Magnesium IV 2 g  Hypokalemia - Potassium goal> 4 -Potassium p.o. 40 mEq      Goals of care - 6/25 coronavirus lab for SNF drawn - At completion of antibiotics in 48 hours will return patient to [facility] SNF   DVT prophylaxis: Lovenox 40 mg daily Code Status: Full Family Communication: None Disposition Plan: TBD   Consultants:  Neurology PCCM   Procedures/Significant Events:  6/23 2-day EEG: Left temporal epileptiform discharge likely his baseline.  No ongoing seizure activity which was stopped on 6/19     I have personally reviewed and interpreted all radiology studies and my findings are as above.  VENTILATOR SETTINGS:      Cultures 6/17 SARS Cov2 - not detected 6/17 BCx >> e.coli >> multi-resistant (including ceftriaxone) 6/17 UCx >> 40K colonies GNR >> klebsiella  6/17 Tracheal aspirate>>  6/19 MRSA PCR - positive 6/22 blood >> 6/22 resp >> 6/25 coronavirus for SNF pending   Antimicrobials: Anti-infectives (From admission, onward)   Start     Stop   06/27/18 1400  meropenem (MERREM) 1 g in sodium chloride 0.9 % 100 mL IVPB  Status:  Discontinued     06/27/18 0949   06/27/18 1000  meropenem (MERREM) 1 g in sodium chloride 0.9 % 100 mL IVPB         06/27/18 0945  piperacillin-tazobactam (ZOSYN) IVPB 3.375 g  Status:  Discontinued     06/27/18 9562

## 2018-07-03 NOTE — NC FL2 (Signed)
[place] MEDICAID FL2 LEVEL OF CARE SCREENING TOOL     IDENTIFICATION  Patient Name: Lee [name] Birthdate: 18-Oct-1950 Sex: male Admission Date (Current Location): 06/25/2018  [facility] and [place] Number:  Producer, television/film/video and Address:  [facility], [address], [place], [place] [address]      Provider Number: [id number]  Attending Physician Name and Address:  Drema Dallas, MD  Relative Name and Phone Number:  Reily Scheuneman [phone]    Current Level of Care: Hospital Recommended Level of Care: Skilled Nursing Facility Prior Approval Number:    Date Approved/Denied:   PASRR Number: [id number]  Discharge Plan: SNF    Current Diagnoses: Patient Active Problem List   Diagnosis Date Noted  . Pressure injury of skin 06/30/2018  . Acute respiratory failure with hypoxia (HCC)   . Seizure disorder (HCC) 06/25/2018  . Seizure (HCC) 08/29/2016  . Leukocytosis 08/29/2016  . History of CVA (cerebrovascular accident) 08/29/2016  . Somnolence 08/29/2016  . Post-ictal state (HCC) 08/29/2016  . Lip laceration   . Pain   . Leg weakness, bilateral   . Leg pain, bilateral 08/17/2016  . UTI (urinary tract infection) 08/15/2016  . Aggression 07/26/2016  . Dementia with behavioral disturbance (HCC) 07/26/2016  . Elevated troponin 12/30/2015  . Chest pain 12/30/2015  . Pneumonia, likely aspiration 11/15/2013  . Status epilepticus (HCC) 11/14/2013  . Essential hypertension 11/14/2013  . Cerebral thrombosis with cerebral infarction (HCC) 11/07/2013  . Seizures (HCC) 11/06/2013  . Chronic airway obstruction, not elsewhere classified 09/22/2013  . Cerebral infarction due to unspecified mechanism 09/22/2013  . Hyperlipidemia 09/18/2013  . Encephalopathy acute 09/11/2013  . Altered mental status 09/11/2013  . CVA (cerebral infarction) 09/11/2013  . Hyponatremia 09/11/2013  . Acute encephalopathy 09/11/2013  . Aftercare following surgery of  the circulatory system, NEC 08/25/2012  . Foot swelling 02/04/2012  . Drainage from wound 01/04/2012  . Peripheral vascular disease, unspecified (HCC) 11/05/2011  . Pain in limb 11/05/2011  . 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 PAD (peripheral artery disease) (HCC) 10/28/2011  . TOBACCO ABUSE 02/06/2007  . SYMPTOM, EDEMA 06/13/2006  . ERECTILE DYSFUNCTION 01/25/2006    Orientation RESPIRATION BLADDER Height & Weight     Self  Normal Indwelling catheter Weight: 130 lb 1.1 oz (59 kg) Height:  5\' 6"  (167.6 cm)  BEHAVIORAL SYMPTOMS/MOOD NEUROLOGICAL BOWEL NUTRITION STATUS    Convulsions/Seizures Incontinent Diet(see DC summary)  AMBULATORY STATUS COMMUNICATION OF NEEDS Skin   Extensive Assist Verbally PU Stage and Appropriate Care   PU Stage 2 Dressing: (coccyx, foam dressing changed PRN; lift dressing to assess every shift)                   Personal Care Assistance Level of Assistance  Bathing, Feeding, Dressing Bathing Assistance: Maximum assistance Feeding assistance: Limited assistance Dressing Assistance: Maximum assistance Total Care Assistance: Maximum assistance   Functional Limitations Info  Sight, Speech, Hearing Sight Info: Adequate Hearing Info: Adequate Speech Info: Impaired(slow to respond)    SPECIAL CARE FACTORS FREQUENCY                       Contractures Contractures Info: Not present    Additional Factors Info  Code Status, Allergies, Insulin Sliding Scale, Isolation Precautions Code Status Info: Full Allergies Info: Orange Juice Orange Oil, Penicillins, Vicodin Hydrocodone-acetaminophen, Oxycodone   Insulin Sliding Scale Info: 0-15 units every 4 hours Isolation  Precautions Info: Contact precautions, ESBL     Current Medications (07/03/2018):  This is the current hospital active medication list Current Facility-Administered Medications  Medication Dose Route Frequency Provider Last Rate Last Dose  .  acetaminophen (TYLENOL) tablet 650 mg  650 mg Per Tube Q4H PRN Oretha Milch, MD   650 mg at 07/03/18 1430  . carbamazepine (TEGRETOL) tablet 300 mg  300 mg Per Tube BID Oretha Milch, MD   300 mg at 07/03/18 0835  . Chlorhexidine Gluconate Cloth 2 % PADS 6 each  6 each Topical Q0600 Oretha Milch, MD   6 each at 07/03/18 0559  . Chlorhexidine Gluconate Cloth 2 % PADS 6 each  6 each Topical Daily Oretha Milch, MD   6 each at 07/03/18 [phone]  . clopidogrel (PLAVIX) tablet 75 mg  75 mg Per Tube Daily Oretha Milch, MD   75 mg at 07/03/18 0835  . dextrose 50 % solution 25 mL  25 mL Intravenous Once Kalman Shan, MD      . enoxaparin (LOVENOX) injection 40 mg  40 mg Subcutaneous Q24H Oretha Milch, MD   40 mg at 07/03/18 1447  . feeding supplement (ENSURE ENLIVE) (ENSURE ENLIVE) liquid 237 mL  237 mL Oral BID BM Drema Dallas, MD   237 mL at 07/03/18 1447  . hydrALAZINE (APRESOLINE) injection 10 mg  10 mg Intravenous Q4H PRN Oretha Milch, MD   10 mg at 06/29/18 2356  . insulin aspart (novoLOG) injection 0-15 Units  0-15 Units Subcutaneous Q4H Oretha Milch, MD   2 Units at 07/02/18 [phone]  . lacosamide (VIMPAT) 50 mg in sodium chloride 0.9 % 25 mL IVPB  50 mg Intravenous Q12H Cyril Mourning V, MD 60 mL/hr at 07/03/18 1011 50 mg at 07/03/18 1011  . lactated ringers infusion   Intravenous Continuous Oretha Milch, MD   Stopped at 06/30/18 2156  . levETIRAcetam (KEPPRA) IVPB 1500 mg/ 100 mL premix  1,500 mg Intravenous Q12H Oretha Milch, MD 400 mL/hr at 07/03/18 0833 1,500 mg at 07/03/18 0833  . liver oil-zinc oxide (DESITIN) 40 % ointment   Topical 5 X Daily Drema Dallas, MD      . MEDLINE mouth rinse  15 mL Mouth Rinse BID Oretha Milch, MD   15 mL at 07/03/18 0834  . meropenem (MERREM) 1 g in sodium chloride 0.9 % 100 mL IVPB  1 g Intravenous Q8H Cyril Mourning V, MD 200 mL/hr at 07/03/18 1430 1 g at 07/03/18 1430  . ondansetron (ZOFRAN) injection 4 mg  4 mg Intravenous Q6H PRN Cyril Mourning V, MD      . sodium chloride flush (NS) 0.9 % injection 10-40 mL  10-40 mL Intracatheter Q12H Oretha Milch, MD   10 mL at 07/03/18 0834  . sodium chloride flush (NS) 0.9 % injection 10-40 mL  10-40 mL Intracatheter PRN Oretha Milch, MD      . valproate (DEPACON) 750 mg in dextrose 5 % 50 mL IVPB  750 mg Intravenous Q8H Oretha Milch, MD 57.5 mL/hr at 07/03/18 1445 750 mg at 07/03/18 1445     Discharge Medications: Please see discharge summary for a list of discharge medications.  Relevant Imaging Results:  Relevant Lab Results:   Additional Information SS#: [id number]  Baldemar Lenis, LCSW

## 2018-07-03 NOTE — Progress Notes (Signed)
Physical Therapy Treatment Patient Details Name: Lee [name] MRN: [id number] DOB: 1950/01/31 Today's Date: 07/03/2018    History of Present Illness 68 yo M w/ dementia, seizures, presents 6/17 following witnessed 10-15 minute GTC seizure from nursing home.  Intubated for airway protection.  SNF since 2017 per wife - due to dementia and wandering off.    PT Comments    Continues to require assist for all mobility. Recommend return to SNF at prior level of care.    Follow Up Recommendations  SNF(at prior level of care)     Equipment Recommendations       Recommendations for Other Services       Precautions / Restrictions Precautions Precautions: Fall    Mobility  Bed Mobility Overal bed mobility: Needs Assistance Bed Mobility: Supine to Sit   Sidelying to sit: +2 for physical assistance;Max assist       General bed mobility comments: Assist to bring legs off. elevate trunk into sitting and bring hips to EOB.  Transfers Overall transfer level: Needs assistance Equipment used: 2 person hand held assist Transfers: Sit to/from [employer] Sit to Stand: Max assist;+2 physical assistance Stand pivot transfers: Max assist;+2 physical assistance       General transfer comment: Assist to bring hips up and for balance. Assist to pivot to chair. Pt unable to take pivotal steps.   Ambulation/Gait             General Gait Details: Unable   Social research officer, government Rank[employer] (Stroke Patients Only)       Balance Overall balance assessment: Needs assistance Sitting-balance support: Bilateral upper extremity supported Sitting balance-Leahy Scale: Poor Sitting balance - Comments: min assist to maintain sitting EOB Postural control: Posterior lean Standing balance support: Bilateral upper extremity supported Standing balance-Leahy Scale: Zero Standing balance comment: +2 max assist to stand                             Cognition Arousal/Alertness: Awake/alert Behavior During Therapy: WFL for tasks assessed/performed Overall Cognitive Status: History of cognitive impairments - at baseline Area of Impairment: Following commands                   Current Attention Level: Sustained   Following Commands: Follows one step commands consistently;Follows one step commands with increased time              Exercises      General Comments        Pertinent Vitals/Pain Pain Assessment: Faces Faces Pain Scale: No hurt    Home Living                      Prior Function            PT Goals (current goals can now be found in the care plan section) Acute Rehab PT Goals Patient Stated Goal: pt unable to state. Progress towards PT goals: Not progressing toward goals - comment    Frequency    Min 2X/week      PT Plan Current plan remains appropriate    Co-evaluation              AM-PAC PT "6 Clicks" Mobility   Outcome Measure  Help needed turning from your back to your side while in a flat bed without using bedrails?: A  Lot Help needed moving from lying on your back to sitting on the side of a flat bed without using bedrails?: Total Help needed moving to and from a bed to a chair (including a wheelchair)?: Total Help needed standing up from a chair using your arms (e.g., wheelchair or bedside chair)?: Total Help needed to walk in hospital room?: Total Help needed climbing 3-5 steps with a railing? : Total 6 Click Score: 7    End of Session Equipment Utilized During Treatment: Gait belt Activity Tolerance: Patient tolerated treatment well Patient left: in chair;with call bell/phone within reach;with chair alarm set Nurse Communication: Mobility status PT Visit Diagnosis: Unsteadiness on feet (R26.81);Other abnormalities of gait and mobility (R26.89);Muscle weakness (generalized) (M62.81);Difficulty in walking, not elsewhere classified (R26.2)      Time: 1027-2536 PT Time Calculation (min) (ACUTE ONLY): 20 min  Charges:  $Therapeutic Activity: 8-22 mins                     [facility] PT Acute Rehabilitation Services Pager [phone] Office [phone]    Angelina Ok [employer] 07/03/2018, 1:52 PM

## 2018-07-04 DIAGNOSIS — R29898 Other symptoms and signs involving the musculoskeletal system: Secondary | ICD-10-CM

## 2018-07-04 DIAGNOSIS — I739 Peripheral vascular disease, unspecified: Secondary | ICD-10-CM

## 2018-07-04 DIAGNOSIS — R339 Retention of urine, unspecified: Secondary | ICD-10-CM

## 2018-07-04 DIAGNOSIS — F0151 Vascular dementia with behavioral disturbance: Secondary | ICD-10-CM

## 2018-07-04 DIAGNOSIS — G9341 Metabolic encephalopathy: Secondary | ICD-10-CM

## 2018-07-04 LAB — GLUCOSE, CAPILLARY
Glucose-Capillary: 106 mg/dL — ABNORMAL HIGH (ref 70–99)
Glucose-Capillary: 75 mg/dL (ref 70–99)
Glucose-Capillary: 80 mg/dL (ref 70–99)
Glucose-Capillary: 90 mg/dL (ref 70–99)
Glucose-Capillary: 97 mg/dL (ref 70–99)
Glucose-Capillary: 99 mg/dL (ref 70–99)

## 2018-07-04 LAB — BASIC METABOLIC PANEL
Anion gap: 10 (ref 5–15)
BUN: 5 mg/dL — ABNORMAL LOW (ref 8–23)
CO2: 25 mmol/L (ref 22–32)
Calcium: 8.6 mg/dL — ABNORMAL LOW (ref 8.9–10.3)
Chloride: 104 mmol/L (ref 98–111)
Creatinine, Ser: 0.55 mg/dL — ABNORMAL LOW (ref 0.61–1.24)
GFR calc Af Amer: >60 mL/min (ref >60)
GFR calc non Af Amer: >60 mL/min (ref >60)
Glucose, Bld: 77 mg/dL (ref 70–99)
Potassium: 4.1 mmol/L (ref 3.5–5.1)
Sodium: 139 mmol/L (ref 135–145)

## 2018-07-04 LAB — MAGNESIUM: Magnesium: 1.9 mg/dL (ref 1.7–2.4)

## 2018-07-04 LAB — NOVEL CORONAVIRUS, NAA (HOSP ORDER, SEND-OUT TO REF LAB; TAT 18-24 HRS): SARS-CoV-2, NAA: NOT DETECTED

## 2018-07-04 MED ORDER — DIVALPROEX SODIUM 500 MG PO DR TAB
750.0000 mg | DELAYED_RELEASE_TABLET | Freq: Three times a day (TID) | ORAL | Status: DC
  Administered 2018-07-04 – 2018-07-05 (×5): 750 mg via ORAL
  Filled 2018-07-04 (×6): qty 1

## 2018-07-04 MED ORDER — LACOSAMIDE 50 MG PO TABS
50.0000 mg | ORAL_TABLET | Freq: Two times a day (BID) | ORAL | Status: DC
  Administered 2018-07-04 – 2018-07-06 (×4): 50 mg via ORAL
  Filled 2018-07-04 (×4): qty 1

## 2018-07-04 MED ORDER — DIVALPROEX SODIUM 250 MG PO DR TAB: 750.0000 mg | DELAYED_RELEASE_TABLET | Freq: Three times a day (TID) | ORAL | 0 refills | Status: DC

## 2018-07-04 MED ORDER — LACOSAMIDE 50 MG PO TABS: 50.0000 mg | ORAL_TABLET | Freq: Two times a day (BID) | ORAL | 0 refills | Status: DC

## 2018-07-04 MED ORDER — LEVETIRACETAM 500 MG PO TABS
1500.0000 mg | ORAL_TABLET | Freq: Two times a day (BID) | ORAL | Status: DC
  Administered 2018-07-04 – 2018-07-05 (×3): 1500 mg via ORAL
  Filled 2018-07-04 (×3): qty 3

## 2018-07-04 MED ORDER — CARBAMAZEPINE 200 MG PO TABS: 300.0000 mg | ORAL_TABLET | Freq: Two times a day (BID) | ORAL | 0 refills | Status: DC

## 2018-07-04 MED ORDER — ZINC OXIDE 40 % EX OINT: TOPICAL_OINTMENT | Freq: Every day | CUTANEOUS | 0 refills | Status: DC

## 2018-07-04 MED FILL — DIVALPROEX SOD DR 250 MG TA: 250 | 30 days supply | Qty: 720 | Fill #0

## 2018-07-04 NOTE — Progress Notes (Signed)
PROGRESS NOTE    Lee [name]  VZD:[id number] DOB: 1950-10-21 DOA: 06/25/2018 PCP: Mirna Mires, MD   Brief Narrative:  68 yo BM PMHx seizure disorder and dementia, CVA, HTN,PAD.PVD, chronic airway obstruction, who resides at [facility]. History is obtained by chart review, which discloses limited available history and history per EMS.    6/17 the patient was witnessed to sustain 10-15 min seizure by staff at living facility, reported tonic-clonic in nature. Per EMS evaluation, he exhibited minimal "twitching" and was subsequently given 2.5mg  Versed. Per ED report, patient had stopped seizing by time or arrival but was assess to be obtunded and not protecting airway, and was subsequently intubated.        Subjective: 6/26 A/O x4, negative CP, negative S OB, negative abdominal pain.  Still some confusion patient stated he walked all the way to the nurses station and back, although was to assist from bed to chair.    Assessment & Plan:   Active Problems:   PAD (peripheral artery disease) (HCC)   Chronic total occlusion of artery of the extremities (HCC)   Cerebral thrombosis with cerebral infarction [facility])   Essential hypertension   Dementia with behavioral disturbance (HCC)   Leg weakness, bilateral   Seizure disorder (HCC)   Acute respiratory failure with hypoxia (HCC)   Pressure injury of skin  Acute respiratory failure with hypoxia -Self extubated 6/22 - Resolved now on room air  Acute encephalopathy  -Multifactorial seizures, infection, metabolic -Punctate right corona radiata area of acute infarction likely incidental per neurology -Correct underlying issues. - Improved - PT/OT: Recommend return to SNF  Seizures -Per patient taking AEDs however per notes patient not compliant with AEDs. -Carbamazepine 300 mg twice daily - Vimpat 50 mg twice daily - Keppra 1500 mg twice daily - Valproate 750 mg every 8 hours - Neurology signed off on 6/23   Bacteremia  positive E. coli (ESBL)/positive coag negative staph -Complete 7-day course antibiotics  UTI positive Klebsiella pneumoniae - Has completed course of antibiotics   Chronic urinary retention -   Patient with chronic Foley from  [facility].  Change prior to discharge to CIR or back to SNF  Hypomagnesmia -Magnesium goal> 2 - Magnesium IV 2 g  Hypokalemia - Potassium goal> 4 -Potassium p.o. 40 mEq      Goals of care - 6/25 coronavirus lab for SNF drawn - At completion of antibiotics in 48 hours will return patient to [facility] SNF   DVT prophylaxis: Lovenox 40 mg daily Code Status: Full Family Communication: None Disposition Plan: TBD   Consultants:  Neurology PCCM   Procedures/Significant Events:  6/23 2-day EEG: Left temporal epileptiform discharge likely his baseline.  No ongoing seizure activity which was stopped on 6/19     I have personally reviewed and interpreted all radiology studies and my findings are as above.  VENTILATOR SETTINGS:      Cultures 6/17 SARS Cov2 - not detected 6/17 BCx >> e.coli >> multi-resistant (including ceftriaxone) 6/17 UCx >> 40K colonies GNR >> klebsiella  6/17 Tracheal aspirate>>  6/19 MRSA PCR - positive 6/22 blood >> 6/22 resp >> 6/25 coronavirus for SNF pending   Antimicrobials: Anti-infectives (From admission, onward)   Start     Stop   06/27/18 1400  meropenem (MERREM) 1 g in sodium chloride 0.9 % 100 mL IVPB  Status:  Discontinued     06/27/18 0949   06/27/18 1000  meropenem (MERREM) 1 g in sodium chloride 0.9 % 100 mL  IVPB     07/05/18 2359   06/27/18 0945  piperacillin-tazobactam (ZOSYN) IVPB 3.375 g  Status:  Discontinued     06/27/18 0948   06/25/18 2200  cefTRIAXone (ROCEPHIN) 2 g in sodium chloride 0.9 % 100 mL IVPB  Status:  Discontinued     06/27/18 0943   06/25/18 0645  aztreonam (AZACTAM) 2 g in sodium chloride 0.9 % 100 mL IVPB  Status:  Discontinued     06/25/18 0641   06/25/18 0645  metroNIDAZOLE  (FLAGYL) IVPB 500 mg     06/25/18 1034   06/25/18 0645  vancomycin (VANCOCIN) IVPB 1000 mg/200 mL premix  Status:  Discontinued     06/25/18 0641   06/25/18 0645  vancomycin (VANCOCIN) 1,250 mg in sodium chloride 0.9 % 250 mL IVPB     06/25/18 0927   06/25/18 0645  ceFEPIme (MAXIPIME) 2 g in sodium chloride 0.9 % 100 mL IVPB     06/25/18 0727       Devices    LINES / TUBES:  Chronic Foley from SNF>>    Continuous Infusions: . lacosamide (VIMPAT) IV 50 mg (07/04/18 0915)  . lactated ringers Stopped (06/30/18 2156)  . levETIRAcetam 1,500 mg (07/04/18 0741)  . meropenem (MERREM) IV 1 g (07/04/18 1320)  . valproate sodium 750 mg (07/04/18 0629)     Objective: Vitals:   07/03/18 1906 07/04/18 0004 07/04/18 0408 07/04/18 0823  BP:  130/65  131/62  Pulse:  74  65  Resp:  16  17  Temp:  98.4 F (36.9 C)  98.3 F (36.8 C)  TempSrc: Oral Oral  Oral  SpO2:  100%  95%  Weight:   57.4 kg   Height:        Intake/Output Summary (Last 24 hours) at 07/04/2018 1333 Last data filed at 07/04/2018 0815 Gross per 24 hour  Intake 580 ml  Output 1200 ml  Net -620 ml   Filed Weights   07/02/18 0500 07/03/18 0416 07/04/18 0408  Weight: 62.1 kg 59 kg 57.4 kg   Physical Exam:  General: A/O x4, (but still some confusion believes he walked all the way to nurses station and back), no acute respiratory distress, cachectic Eyes: negative scleral hemorrhage, negative anisocoria, negative icterus ENT: Negative Runny nose, negative gingival bleeding, poor dentition Neck:  Negative scars, masses, torticollis, lymphadenopathy, JVD Lungs: Clear to auscultation bilaterally without wheezes or crackles Cardiovascular: Regular rate and rhythm without murmur gallop or rub normal S1 and S2 Abdomen: negative abdominal pain, nondistended, positive soft, bowel sounds, no rebound, no ascites, no appreciable mass Extremities: No significant cyanosis, clubbing, or edema bilateral lower extremities Skin:  Negative rashes, lesions, ulcers Psychiatric:  Negative depression, negative anxiety, negative fatigue, negative mania  Central nervous system:  Cranial nerves II through XII intact, tongue/uvula midline, all extremities muscle strength 5/5, sensation intact throughout, negative dysarthria, negative expressive aphasia, negative receptive aphasia. .     Data Reviewed: Care during the described time interval was provided by me .  I have reviewed this patient's available data, including medical history, events of note, physical examination, and all test results as part of my evaluation.   CBC: Recent Labs  Lab 06/28/18 0537 06/29/18 0359 06/30/18 0436 07/01/18 0237 07/03/18 0921  WBC 8.1 7.3 9.3 9.7 9.1  NEUTROABS  --  4.7 6.6 6.1  --   HGB 12.0* 11.7* 12.6* 12.5* 12.2*  HCT 35.0* 34.7* 37.7* 37.2* 35.8*  MCV 94.3 94.8 94.7 95.6 95.2  PLT  320 295 373 364 438*   Basic Metabolic Panel: Recent Labs  Lab 06/27/18 1619  06/29/18 0359 06/30/18 0436 07/01/18 0237 07/03/18 0921 07/04/18 0416  NA  --    < > 138 136 138 139 139  K  --    < > 3.8 3.7 3.8 3.5 4.1  CL  --    < > 105 104 104 104 104  CO2  --    < > 25 24 25 26 25   GLUCOSE  --    < > 116* 129* 71 96 77  BUN  --    < > 12 12 10  6* 5*  CREATININE  --    < > 0.49* 0.50* 0.59* 0.55* 0.55*  CALCIUM  --    < > 8.7* 8.5* 8.7* 8.6* 8.6*  MG 2.2  --  1.7 1.8 1.9 1.6* 1.9  PHOS 3.6  --  3.1 3.0 3.3  --   --    < > = values in this interval not displayed.   GFR: Estimated Creatinine Clearance: 70.8 mL/min (A) (by C-G formula based on SCr of 0.55 mg/dL (L)). Liver Function Tests: Recent Labs  Lab 06/29/18 0359  AST 32  ALT 20  ALKPHOS 63  BILITOT 0.6  PROT 6.2*  ALBUMIN 2.4*   No results for input(s): LIPASE, AMYLASE in the last 168 hours. Recent Labs  Lab 06/29/18 0918  AMMONIA 29   Coagulation Profile: Recent Labs  Lab 06/30/18 0436  INR 1.1   Cardiac Enzymes: Recent Labs  Lab 06/30/18 0436  TROPONINI  <0.03   BNP (last 3 results) No results for input(s): PROBNP in the last 8760 hours. HbA1C: No results for input(s): HGBA1C in the last 72 hours. CBG: Recent Labs  Lab 07/03/18 1953 07/04/18 0001 07/04/18 0405 07/04/18 0718 07/04/18 1144  GLUCAP 94 99 75 80 106*   Lipid Profile: No results for input(s): CHOL, HDL, LDLCALC, TRIG, CHOLHDL, LDLDIRECT in the last 72 hours. Thyroid Function Tests: No results for input(s): TSH, T4TOTAL, FREET4, T3FREE, THYROIDAB in the last 72 hours. Anemia Panel: No results for input(s): VITAMINB12, FOLATE, FERRITIN, TIBC, IRON, RETICCTPCT in the last 72 hours. Urine analysis:    Component Value Date/Time   COLORURINE YELLOW 06/25/2018 0639   APPEARANCEUR HAZY (A) 06/25/2018 0639   LABSPEC 1.015 06/25/2018 0639   PHURINE 5.0 06/25/2018 0639   GLUCOSEU NEGATIVE 06/25/2018 0639   HGBUR MODERATE (A) 06/25/2018 0639   BILIRUBINUR NEGATIVE 06/25/2018 0639   KETONESUR 5 (A) 06/25/2018 0639   PROTEINUR 100 (A) 06/25/2018 0639   UROBILINOGEN 0.2 03/22/2014 1206   NITRITE NEGATIVE 06/25/2018 0639   LEUKOCYTESUR TRACE (A) 06/25/2018 0639   Sepsis Labs: @LABRCNTIP (procalcitonin:4,lacticidven:4)  ) Recent Results (from the past 240 hour(s))  SARS Coronavirus 2     Status: None   Collection Time: 06/25/18  6:46 AM  Result Value Ref Range Status   SARS Coronavirus 2 NOT DETECTED NOT DETECTED Final    Comment: (NOTE) SARS-CoV-2 target nucleic acids are NOT DETECTED. The SARS-CoV-2 RNA is generally detectable in upper and lower respiratory specimens during the acute phase of infection.  Negative  results do not preclude SARS-CoV-2 infection, do not rule out co-infections with other pathogens, and should not be used as the sole basis for treatment or other patient management decisions.  Negative results must be combined with clinical observations, patient history, and epidemiological information. The expected result is Not Detected. Fact Sheet for  Patients: http://www.biofiredefense.com/wp-content/uploads/2020/03/BIOFIRE-COVID -19-patients.pdf Fact Sheet for Healthcare  Providers: http://www.biofiredefense.com/wp-content/uploads/2020/03/BIOFIRE-COVID -19-hcp.pdf This test is not yet approved or cleared by the [place] and  has been authorized for detection and/or diagnosis of SARS-CoV-2 by FDA under an Emergency Use Authorization (EUA).  This EUA will remain in effec t (meaning this test can be used) for the duration of  the COVID-19 declaration under Section 564(b)(1) of the Act, 21 U.S.C. section 360bbb-3(b)(1), unless the authorization is terminated or revoked sooner. Performed at [facility], [address], [place], [place] [address]   Urine culture     Status: Abnormal   Collection Time: 06/25/18  6:49 AM   Specimen: Urine, Random  Result Value Ref Range Status   Specimen Description URINE, RANDOM  Final   Special Requests   Final    NONE Performed at [facility], [address] [place], [place] [address]    Culture 40,000 COLONIES/mL KLEBSIELLA PNEUMONIAE (A)  Final   Report Status 06/27/2018 FINAL  Final   Organism ID, Bacteria KLEBSIELLA PNEUMONIAE (A)  Final      Susceptibility   Klebsiella pneumoniae - MIC*    AMPICILLIN >=32 RESISTANT Resistant     CEFAZOLIN <=4 SENSITIVE Sensitive     CEFTRIAXONE <=1 SENSITIVE Sensitive     CIPROFLOXACIN <=0.25 SENSITIVE Sensitive     GENTAMICIN <=1 SENSITIVE Sensitive     IMIPENEM <=0.25 SENSITIVE Sensitive     NITROFURANTOIN 64 INTERMEDIATE Intermediate     TRIMETH/SULFA <=20 SENSITIVE Sensitive     AMPICILLIN/SULBACTAM 4 SENSITIVE Sensitive     PIP/TAZO <=4 SENSITIVE Sensitive     Extended ESBL NEGATIVE Sensitive     * 40,000 COLONIES/mL KLEBSIELLA PNEUMONIAE  Blood Culture (routine x 2)     Status: Abnormal   Collection Time: 06/25/18  6:50 AM   Specimen: BLOOD LEFT ARM  Result Value Ref Range Status   Specimen Description BLOOD LEFT ARM   Final   Special Requests   Final    BOTTLES DRAWN AEROBIC AND ANAEROBIC Blood Culture adequate volume   Culture  Setup Time   Final    GRAM NEGATIVE RODS IN BOTH AEROBIC AND ANAEROBIC BOTTLES CRITICAL RESULT CALLED TO, READ BACK BY AND VERIFIED WITH: Jone Baseman [facility] 2038 06/25/18 A BROWNING Performed at [facility], [address] [place], [place] [address]    Culture (A)  Final    ESCHERICHIA COLI Confirmed Extended Spectrum Beta-Lactamase Producer (ESBL).  In bloodstream infections from ESBL organisms, carbapenems are preferred over piperacillin/tazobactam. They are shown to have a lower risk of mortality.    Report Status 06/27/2018 FINAL  Final   Organism ID, Bacteria ESCHERICHIA COLI  Final      Susceptibility   Escherichia coli - MIC*    AMPICILLIN >=32 RESISTANT Resistant     CEFAZOLIN >=64 RESISTANT Resistant     CEFEPIME RESISTANT Resistant     CEFTAZIDIME RESISTANT Resistant     CEFTRIAXONE >=64 RESISTANT Resistant     CIPROFLOXACIN <=0.25 SENSITIVE Sensitive     GENTAMICIN >=16 RESISTANT Resistant     IMIPENEM <=0.25 SENSITIVE Sensitive     TRIMETH/SULFA >=320 RESISTANT Resistant     AMPICILLIN/SULBACTAM >=32 RESISTANT Resistant     PIP/TAZO <=4 SENSITIVE Sensitive     Extended ESBL POSITIVE Resistant     * ESCHERICHIA COLI  Blood Culture ID Panel (Reflexed)     Status: Abnormal   Collection Time: 06/25/18  6:50 AM  Result Value Ref Range Status   Enterococcus species NOT DETECTED  NOT DETECTED Final   Listeria monocytogenes NOT DETECTED NOT DETECTED Final   Staphylococcus species NOT DETECTED NOT DETECTED Final   Staphylococcus aureus (BCID) NOT DETECTED NOT DETECTED Final   Streptococcus species NOT DETECTED NOT DETECTED Final   Streptococcus agalactiae NOT DETECTED NOT DETECTED Final   Streptococcus pneumoniae NOT DETECTED NOT DETECTED Final   Streptococcus pyogenes NOT DETECTED NOT DETECTED Final   Acinetobacter baumannii NOT DETECTED NOT DETECTED Final    Enterobacteriaceae species DETECTED (A) NOT DETECTED Final    Comment: Enterobacteriaceae represent a large family of gram-negative bacteria, not a single organism. CRITICAL RESULT CALLED TO, READ BACK BY AND VERIFIED WITH: K HURTH PHARMD 2038 06/25/18 A BROWNING    Enterobacter cloacae complex NOT DETECTED NOT DETECTED Final   Escherichia coli DETECTED (A) NOT DETECTED Final    Comment: CRITICAL RESULT CALLED TO, READ BACK BY AND VERIFIED WITH: K HURTH PHARMD 2038 06/25/18 A BROWNING    Klebsiella oxytoca NOT DETECTED NOT DETECTED Final   Klebsiella pneumoniae NOT DETECTED NOT DETECTED Final   Proteus species NOT DETECTED NOT DETECTED Final   Serratia marcescens NOT DETECTED NOT DETECTED Final   Carbapenem resistance NOT DETECTED NOT DETECTED Final   Haemophilus influenzae NOT DETECTED NOT DETECTED Final   Neisseria meningitidis NOT DETECTED NOT DETECTED Final   Pseudomonas aeruginosa NOT DETECTED NOT DETECTED Final   Candida albicans NOT DETECTED NOT DETECTED Final   Candida glabrata NOT DETECTED NOT DETECTED Final   Candida krusei NOT DETECTED NOT DETECTED Final   Candida parapsilosis NOT DETECTED NOT DETECTED Final   Candida tropicalis NOT DETECTED NOT DETECTED Final    Comment: Performed at [facility], [address] [place], [place] [address]  Blood Culture (routine x 2)     Status: Abnormal   Collection Time: 06/25/18  6:33 PM   Specimen: BLOOD LEFT HAND  Result Value Ref Range Status   Specimen Description BLOOD LEFT HAND  Final   Special Requests   Final    BOTTLES DRAWN AEROBIC ONLY Blood Culture adequate volume   Culture  Setup Time   Final    AEROBIC BOTTLE ONLY GRAM POSITIVE COCCI CRITICAL RESULT CALLED TO, READ BACK BY AND VERIFIED WITH: G ABBOTT PHARMD 06/27/18 0054 JDW    Culture (A)  Final    STAPHYLOCOCCUS SPECIES (COAGULASE NEGATIVE) THE SIGNIFICANCE OF ISOLATING THIS ORGANISM FROM A SINGLE SET OF BLOOD CULTURES WHEN MULTIPLE SETS ARE DRAWN IS UNCERTAIN.  PLEASE NOTIFY THE MICROBIOLOGY DEPARTMENT WITHIN ONE WEEK IF SPECIATION AND SENSITIVITIES ARE REQUIRED. Performed at [facility], [address] [place], [place] [address]    Report Status 06/28/2018 FINAL  Final  Blood Culture ID Panel (Reflexed)     Status: None   Collection Time: 06/25/18  6:33 PM  Result Value Ref Range Status   Enterococcus species NOT DETECTED NOT DETECTED Final   Listeria monocytogenes NOT DETECTED NOT DETECTED Final   Staphylococcus species NOT DETECTED NOT DETECTED Final   Staphylococcus aureus (BCID) NOT DETECTED NOT DETECTED Final   Streptococcus species NOT DETECTED NOT DETECTED Final   Streptococcus agalactiae NOT DETECTED NOT DETECTED Final   Streptococcus pneumoniae NOT DETECTED NOT DETECTED Final   Streptococcus pyogenes NOT DETECTED NOT DETECTED Final   Acinetobacter baumannii NOT DETECTED NOT DETECTED Final   Enterobacteriaceae species NOT DETECTED NOT DETECTED Final   Enterobacter cloacae complex NOT DETECTED NOT DETECTED Final   Escherichia coli NOT DETECTED NOT DETECTED Final   Klebsiella oxytoca NOT  DETECTED NOT DETECTED Final   Klebsiella pneumoniae NOT DETECTED NOT DETECTED Final   Proteus species NOT DETECTED NOT DETECTED Final   Serratia marcescens NOT DETECTED NOT DETECTED Final   Haemophilus influenzae NOT DETECTED NOT DETECTED Final   Neisseria meningitidis NOT DETECTED NOT DETECTED Final   Pseudomonas aeruginosa NOT DETECTED NOT DETECTED Final   Candida albicans NOT DETECTED NOT DETECTED Final   Candida glabrata NOT DETECTED NOT DETECTED Final   Candida krusei NOT DETECTED NOT DETECTED Final   Candida parapsilosis NOT DETECTED NOT DETECTED Final   Candida tropicalis NOT DETECTED NOT DETECTED Final    Comment: Performed at [facility], [address] [place], [place] [address]  MRSA PCR Screening     Status: Abnormal   Collection Time: 06/27/18  9:03 AM   Specimen: Nasal Mucosa; Nasopharyngeal  Result Value Ref Range  Status   MRSA by PCR POSITIVE (A) NEGATIVE Final    Comment:        The GeneXpert MRSA Assay (FDA approved for NASAL specimens only), is one component of a comprehensive MRSA colonization surveillance program. It is not intended to diagnose MRSA infection nor to guide or monitor treatment for MRSA infections. RESULT CALLED TO, READ BACK BY AND VERIFIED WITH: Monte Fantasia RN 11:00 06/27/18 ([email or web address]) Performed at [facility], [address] [place], [place] [address]   Culture, respiratory (non-expectorated)     Status: None   Collection Time: 06/30/18 11:35 AM   Specimen: Tracheal Aspirate; Respiratory  Result Value Ref Range Status   Specimen Description TRACHEAL ASPIRATE  Final   Special Requests NONE  Final   Gram Stain NO WBC SEEN NO ORGANISMS SEEN   Final   Culture   Final    Consistent with normal respiratory flora. Performed at [facility], [address], [place], [place] [address]    Report Status 07/02/2018 FINAL  Final  Culture, blood (Routine X 2) w Reflex to ID Panel     Status: None (Preliminary result)   Collection Time: 06/30/18  1:21 PM   Specimen: BLOOD RIGHT HAND  Result Value Ref Range Status   Specimen Description BLOOD RIGHT HAND  Final   Special Requests   Final    BOTTLES DRAWN AEROBIC ONLY Blood Culture adequate volume   Culture   Final    NO GROWTH 4 DAYS Performed at [facility], [address] [place], [place] [address]    Report Status PENDING  Incomplete  Culture, blood (Routine X 2) w Reflex to ID Panel     Status: None (Preliminary result)   Collection Time: 06/30/18  2:52 PM   Specimen: BLOOD RIGHT HAND  Result Value Ref Range Status   Specimen Description BLOOD RIGHT HAND  Final   Special Requests   Final    BOTTLES DRAWN AEROBIC ONLY Blood Culture results may not be optimal due to an inadequate volume of blood received in culture bottles   Culture   Final    NO GROWTH 4 DAYS Performed at [facility],  [address] [place], [place] [address]    Report Status PENDING  Incomplete  C difficile quick scan w PCR reflex     Status: None   Collection Time: 07/02/18  1:56 AM   Specimen: STOOL  Result Value Ref Range Status   C Diff antigen NEGATIVE NEGATIVE Final   C Diff toxin NEGATIVE NEGATIVE Final   C Diff interpretation No C.  difficile detected.  Final    Comment: Performed at [facility], [address], [place], [place] [address]         Radiology Studies: No results found.      Scheduled Meds: . carbamazepine  300 mg Per Tube BID  . Chlorhexidine Gluconate Cloth  6 each Topical Q0600  . Chlorhexidine Gluconate Cloth  6 each Topical Daily  . clopidogrel  75 mg Per Tube Daily  . dextrose  25 mL Intravenous Once  . enoxaparin (LOVENOX) injection  40 mg Subcutaneous Q24H  . feeding supplement (ENSURE ENLIVE)  237 mL Oral BID BM  . insulin aspart  0-15 Units Subcutaneous Q4H  . liver oil-zinc oxide   Topical 5 X Daily  . mouth rinse  15 mL Mouth Rinse BID  . sodium chloride flush  10-40 mL Intracatheter Q12H   Continuous Infusions: . lacosamide (VIMPAT) IV 50 mg (07/04/18 0915)  . lactated ringers Stopped (06/30/18 2156)  . levETIRAcetam 1,500 mg (07/04/18 0741)  . meropenem (MERREM) IV 1 g (07/04/18 1320)  . valproate sodium 750 mg (07/04/18 0629)     LOS: 9 days   The patient is critically ill with multiple organ systems failure and requires high complexity decision making for assessment and support, frequent evaluation and titration of therapies, application of advanced monitoring technologies and extensive interpretation of multiple databases. Critical Care Time devoted to patient care services described in this note  Time spent: 40 minutes     Keshonda Monsour, Roselind Messier, MD [facility] Pager [phone]  If 7PM-7AM, please contact night-coverage [email or web address] Password [email or web address] 07/04/2018, 1:33 PM

## 2018-07-04 NOTE — Progress Notes (Signed)
Speech Language Pathology Treatment: Dysphagia  Patient Details Name: Lee [name] MRN: [id number] DOB: 09/25/1950 Today's Date: 07/04/2018 Time: 1308-6578 SLP Time Calculation (min) (ACUTE ONLY): 19 min  Assessment / Plan / Recommendation Clinical Impression  SLP provided set-up assistance and Min cues throughout breakfast meal. Cueing primarily was needed as pt tends to take additional bites of food before he has fully cleared his mouth from his previous bites. His limited dentition makes oral preparation prolonged even with soft solids, but given extra time, he clears his mouth well. SLP also provided advanced bolus delivery of thin liquids via straw, with no overt signs of aspiration given large, consecutive sips. Imaging reviewed with penetration on MBS appearing to be trace, and he has had additional time post-extubation. Recommend continuing Dys 3 diet and thin liquids, but allowing straws. SLP will f/u briefly for tolerance.   HPI HPI: 68 yo M PMH known seizure disorder and dementia, previous CVA who resides at SNF. History is obtained by chart review, which discloses limited available history and history per EMS.       SLP Plan  Continue with current plan of care       Recommendations  Diet recommendations: Dysphagia 3 (mechanical soft);Thin liquid Liquids provided via: Cup;Straw Medication Administration: Crushed with puree Supervision: Patient able to self feed;Intermittent supervision to cue for compensatory strategies Compensations: Minimize environmental distractions;Slow rate;Small sips/bites Postural Changes and/or Swallow Maneuvers: Seated upright 90 degrees                Oral Care Recommendations: Oral care BID Follow up Recommendations: Skilled Nursing facility SLP Visit Diagnosis: Dysphagia, oropharyngeal phase (R13.12) Plan: Continue with current plan of care       GO                [name] [name] 07/04/2018, 10:22 AM  [name], M.A.  CCC-SLP Acute [name] [phone] Office [phone]

## 2018-07-04 NOTE — Discharge Summary (Addendum)
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 diagnostic testing is negative, the possibility of a false negative result should be considered in the context of a patient's recent exposures and the presence of clinical signs and symptoms consistent with COVID-19. An individual without symptoms of COVID-19 and who is not shedding SARS-CoV-2 virus would expect to have a negative (not detected) result in this assay. Performed  At: [facility] [address] [place], [place] [id number] Jolene SchimkeNagendra Sanjai MD VF:[id number]:[phone]    Coronavirus Source NASOPHARYNGEAL  Final    Comment: Performed at [facility], [address] [place], [place] [address]     Labs: Basic Metabolic Panel: Recent Labs  Lab 06/30/18 0436 07/01/18 0237 07/03/18 0921 07/04/18 0416 07/05/18 0450 07/06/18 0646  NA 136 138 139 139 136 136  K 3.7 3.8 3.5 4.1 3.9 3.9  CL 104 104 104 104 99 98  CO2 24 25 26  25  26 27  GLUCOSE 129* 71 96 77 87 90  BUN 12 10 6* 5* 6* 9  CREATININE 0.50* 0.59* 0.55* 0.55* 0.60* 0.64  CALCIUM 8.5* 8.7* 8.6* 8.6* 8.9 8.7*  MG 1.8 1.9 1.6* 1.9 1.8 1.7  PHOS 3.0 3.3  --   --   --   --    Liver Function  Tests: No results for input(s): AST, ALT, ALKPHOS, BILITOT, PROT, ALBUMIN in the last 168 hours. No results for input(s): LIPASE, AMYLASE in the last 168 hours. No results for input(s): AMMONIA in the last 168 hours. CBC: Recent Labs  Lab 06/30/18 0436 07/01/18 0237 07/03/18 0921  WBC 9.3 9.7 9.1  NEUTROABS 6.6 6.1  --   HGB 12.6* 12.5* 12.2*  HCT 37.7* 37.2* 35.8*  MCV 94.7 95.6 95.2  PLT 373 364 438*   Cardiac Enzymes: Recent Labs  Lab 06/30/18 0436  TROPONINI <0.03   BNP: BNP (last 3 results) Recent Labs    03/22/18 0557  BNP 42.9    ProBNP (last 3 results) No results for input(s): PROBNP in the last 8760 hours.  CBG: Recent Labs  Lab 07/05/18 1659 07/05/18 1914 07/05/18 2301 07/06/18 0338 07/06/18 0814  GLUCAP 105* 105* 103* 101* 81       Signed:  Carolyne Littles, MD [facility] [phone] pager

## 2018-07-05 LAB — BASIC METABOLIC PANEL
Anion gap: 11 (ref 5–15)
BUN: 6 mg/dL — ABNORMAL LOW (ref 8–23)
CO2: 26 mmol/L (ref 22–32)
Calcium: 8.9 mg/dL (ref 8.9–10.3)
Chloride: 99 mmol/L (ref 98–111)
Creatinine, Ser: 0.6 mg/dL — ABNORMAL LOW (ref 0.61–1.24)
GFR calc Af Amer: >60 mL/min (ref >60)
GFR calc non Af Amer: >60 mL/min (ref >60)
Glucose, Bld: 87 mg/dL (ref 70–99)
Potassium: 3.9 mmol/L (ref 3.5–5.1)
Sodium: 136 mmol/L (ref 135–145)

## 2018-07-05 LAB — GLUCOSE, CAPILLARY
Glucose-Capillary: 103 mg/dL — ABNORMAL HIGH (ref 70–99)
Glucose-Capillary: 105 mg/dL — ABNORMAL HIGH (ref 70–99)
Glucose-Capillary: 105 mg/dL — ABNORMAL HIGH (ref 70–99)
Glucose-Capillary: 107 mg/dL — ABNORMAL HIGH (ref 70–99)
Glucose-Capillary: 80 mg/dL (ref 70–99)
Glucose-Capillary: 82 mg/dL (ref 70–99)
Glucose-Capillary: 88 mg/dL (ref 70–99)

## 2018-07-05 LAB — CULTURE, BLOOD (ROUTINE X 2)
Culture: NO GROWTH
Culture: NO GROWTH
Special Requests: ADEQUATE

## 2018-07-05 LAB — MAGNESIUM: Magnesium: 1.8 mg/dL (ref 1.7–2.4)

## 2018-07-05 MED ORDER — LEVETIRACETAM 100 MG/ML PO SOLN
1500.0000 mg | Freq: Two times a day (BID) | ORAL | Status: DC
  Administered 2018-07-05 – 2018-07-06 (×2): 1500 mg via ORAL
  Filled 2018-07-05 (×3): qty 15

## 2018-07-05 MED ORDER — VALPROIC ACID 250 MG/5ML PO SOLN
750.0000 mg | Freq: Three times a day (TID) | ORAL | Status: DC
  Administered 2018-07-05 – 2018-07-06 (×2): 750 mg via ORAL
  Filled 2018-07-05 (×4): qty 15

## 2018-07-05 NOTE — Progress Notes (Signed)
PO Keppra and PO Depakote not administered after discussion with pharmacy regarding crushing pills.  Pharmacist has already discontinued and locked order, therefore I am unable to edit the administration to reflect that these 2200 doses were not given.    PO Vimpat and PO tegretol were crushed and administered per pharmacy approval.

## 2018-07-05 NOTE — Progress Notes (Signed)
CSW has reached out to Admissions Director, Jacqulynn Cadet with [employer]. CSW has not heard back. CSW has called twice and left a voicemail.   CSW is awaiting a return phone call. CSW will continue to follow and assist with discharge.   Domenic Schwab, MSW, [place]

## 2018-07-06 LAB — BASIC METABOLIC PANEL
Anion gap: 11 (ref 5–15)
BUN: 9 mg/dL (ref 8–23)
CO2: 27 mmol/L (ref 22–32)
Calcium: 8.7 mg/dL — ABNORMAL LOW (ref 8.9–10.3)
Chloride: 98 mmol/L (ref 98–111)
Creatinine, Ser: 0.64 mg/dL (ref 0.61–1.24)
GFR calc Af Amer: >60 mL/min (ref >60)
GFR calc non Af Amer: >60 mL/min (ref >60)
Glucose, Bld: 90 mg/dL (ref 70–99)
Potassium: 3.9 mmol/L (ref 3.5–5.1)
Sodium: 136 mmol/L (ref 135–145)

## 2018-07-06 LAB — MAGNESIUM: Magnesium: 1.7 mg/dL (ref 1.7–2.4)

## 2018-07-06 LAB — GLUCOSE, CAPILLARY
Glucose-Capillary: 101 mg/dL — ABNORMAL HIGH (ref 70–99)
Glucose-Capillary: 81 mg/dL (ref 70–99)

## 2018-07-06 MED ORDER — VALPROIC ACID 250 MG/5ML PO SOLN: 750.0000 mg | Freq: Three times a day (TID) | ORAL | 0 refills | Status: DC

## 2018-07-06 MED ORDER — LEVETIRACETAM 100 MG/ML PO SOLN: 1500.0000 mg | Freq: Two times a day (BID) | ORAL | 0 refills | Status: DC

## 2018-07-06 NOTE — TOC Transition Note (Signed)
Transition of Care [facility]) - CM/SW Discharge Note   Patient Details  Name: Lee [name] MRN: [id number] Date of Birth: Mar 09, 1950  [facility] CM/SW Contact:  Gelene Mink, [place] Phone Number: 07/06/2018, 11:32 AM   Clinical Narrative:     Patient will DC to: [facility] date: 07/06/2018 Family notified: Yes Transport by: Corey Harold   Per MD patient ready for DC to . RN, patient, patient's family, and facility notified of DC. Discharge Summary and FL2 sent to facility. RN to call report prior to discharge [phone]). The patient will report to room 1204.  DC packet on chart. Ambulance transport requested for patient.   CSW will sign off for now as social work intervention is no longer needed. Please consult [place] again if new needs arise.  Rashi Granier, LCSW-A [place]/Clinical Social Work Department Cell: [phone]    Final next level of care: [place] Barriers to Discharge: No Barriers Identified   Patient Goals and CMS Choice Patient states their goals for this hospitalization and ongoing recovery are:: [facility].gov Compare Post Acute Care list provided to:: Patient Represenative (must comment) Choice offered to / list presented to : Spouse  Discharge Placement   Existing PASRR number confirmed : 07/03/18          Patient [facility]: [facility] Patient to be transferred to facility by: [facility] Name of family member notified: Neila Gear, Spouse Patient and family notified of of transfer: 07/06/18  Discharge Plan and Services                DME Arranged: N/A DME Agency: NA       HH Arranged: NA HH Agency: NA        Social Determinants of Health (SDOH) Interventions     Readmission Risk Interventions No flowsheet data found.

## 2018-07-06 NOTE — Plan of Care (Signed)
  Problem: Nutrition: Goal: Adequate nutrition will be maintained Outcome: Progressing   Problem: Elimination: Goal: Will not experience complications related to bowel motility Outcome: Progressing Goal: Will not experience complications related to urinary retention Outcome: Progressing   

## 2018-07-07 DIAGNOSIS — I69959 Hemiplegia and hemiparesis following unspecified cerebrovascular disease affecting unspecified side: Secondary | ICD-10-CM | POA: Diagnosis not present

## 2018-07-07 DIAGNOSIS — U071 COVID-19: Secondary | ICD-10-CM | POA: Diagnosis not present

## 2018-07-07 DIAGNOSIS — R278 Other lack of coordination: Secondary | ICD-10-CM | POA: Diagnosis not present

## 2018-07-07 DIAGNOSIS — M79662 Pain in left lower leg: Secondary | ICD-10-CM | POA: Diagnosis not present

## 2018-07-07 DIAGNOSIS — J9690 Respiratory failure, unspecified, unspecified whether with hypoxia or hypercapnia: Secondary | ICD-10-CM | POA: Diagnosis not present

## 2018-07-07 DIAGNOSIS — R2681 Unsteadiness on feet: Secondary | ICD-10-CM | POA: Diagnosis not present

## 2018-07-07 DIAGNOSIS — R569 Unspecified convulsions: Secondary | ICD-10-CM | POA: Diagnosis not present

## 2018-07-07 DIAGNOSIS — M79661 Pain in right lower leg: Secondary | ICD-10-CM | POA: Diagnosis not present

## 2018-07-07 DIAGNOSIS — R1312 Dysphagia, oropharyngeal phase: Secondary | ICD-10-CM | POA: Diagnosis not present

## 2018-07-07 DIAGNOSIS — R1319 Other dysphagia: Secondary | ICD-10-CM | POA: Diagnosis not present

## 2018-07-08 DIAGNOSIS — R2681 Unsteadiness on feet: Secondary | ICD-10-CM | POA: Diagnosis not present

## 2018-07-08 DIAGNOSIS — R1319 Other dysphagia: Secondary | ICD-10-CM | POA: Diagnosis not present

## 2018-07-08 DIAGNOSIS — M79661 Pain in right lower leg: Secondary | ICD-10-CM | POA: Diagnosis not present

## 2018-07-08 DIAGNOSIS — I639 Cerebral infarction, unspecified: Secondary | ICD-10-CM | POA: Diagnosis not present

## 2018-07-08 DIAGNOSIS — M79662 Pain in left lower leg: Secondary | ICD-10-CM | POA: Diagnosis not present

## 2018-07-08 DIAGNOSIS — U071 COVID-19: Secondary | ICD-10-CM | POA: Diagnosis not present

## 2018-07-08 DIAGNOSIS — R1312 Dysphagia, oropharyngeal phase: Secondary | ICD-10-CM | POA: Diagnosis not present

## 2018-07-08 DIAGNOSIS — J9690 Respiratory failure, unspecified, unspecified whether with hypoxia or hypercapnia: Secondary | ICD-10-CM | POA: Diagnosis not present

## 2018-07-08 DIAGNOSIS — A499 Bacterial infection, unspecified: Secondary | ICD-10-CM | POA: Diagnosis not present

## 2018-07-08 DIAGNOSIS — R278 Other lack of coordination: Secondary | ICD-10-CM | POA: Diagnosis not present

## 2018-07-09 DIAGNOSIS — M6281 Muscle weakness (generalized): Secondary | ICD-10-CM | POA: Diagnosis not present

## 2018-07-09 DIAGNOSIS — R2681 Unsteadiness on feet: Secondary | ICD-10-CM | POA: Diagnosis not present

## 2018-07-09 DIAGNOSIS — J9690 Respiratory failure, unspecified, unspecified whether with hypoxia or hypercapnia: Secondary | ICD-10-CM | POA: Diagnosis not present

## 2018-07-09 DIAGNOSIS — R1312 Dysphagia, oropharyngeal phase: Secondary | ICD-10-CM | POA: Diagnosis not present

## 2018-07-10 DIAGNOSIS — M6281 Muscle weakness (generalized): Secondary | ICD-10-CM | POA: Diagnosis not present

## 2018-07-10 DIAGNOSIS — R2681 Unsteadiness on feet: Secondary | ICD-10-CM | POA: Diagnosis not present

## 2018-07-10 DIAGNOSIS — J9690 Respiratory failure, unspecified, unspecified whether with hypoxia or hypercapnia: Secondary | ICD-10-CM | POA: Diagnosis not present

## 2018-07-10 DIAGNOSIS — R1312 Dysphagia, oropharyngeal phase: Secondary | ICD-10-CM | POA: Diagnosis not present

## 2018-07-11 DIAGNOSIS — M6281 Muscle weakness (generalized): Secondary | ICD-10-CM | POA: Diagnosis not present

## 2018-07-11 DIAGNOSIS — R1312 Dysphagia, oropharyngeal phase: Secondary | ICD-10-CM | POA: Diagnosis not present

## 2018-07-11 DIAGNOSIS — R2681 Unsteadiness on feet: Secondary | ICD-10-CM | POA: Diagnosis not present

## 2018-07-11 DIAGNOSIS — J9690 Respiratory failure, unspecified, unspecified whether with hypoxia or hypercapnia: Secondary | ICD-10-CM | POA: Diagnosis not present

## 2018-07-14 DIAGNOSIS — R1312 Dysphagia, oropharyngeal phase: Secondary | ICD-10-CM | POA: Diagnosis not present

## 2018-07-14 DIAGNOSIS — M6281 Muscle weakness (generalized): Secondary | ICD-10-CM | POA: Diagnosis not present

## 2018-07-14 DIAGNOSIS — R2681 Unsteadiness on feet: Secondary | ICD-10-CM | POA: Diagnosis not present

## 2018-07-14 DIAGNOSIS — Z1612 Extended spectrum beta lactamase (ESBL) resistance: Secondary | ICD-10-CM | POA: Diagnosis not present

## 2018-07-14 DIAGNOSIS — J9690 Respiratory failure, unspecified, unspecified whether with hypoxia or hypercapnia: Secondary | ICD-10-CM | POA: Diagnosis not present

## 2018-07-15 DIAGNOSIS — R2681 Unsteadiness on feet: Secondary | ICD-10-CM | POA: Diagnosis not present

## 2018-07-15 DIAGNOSIS — J9690 Respiratory failure, unspecified, unspecified whether with hypoxia or hypercapnia: Secondary | ICD-10-CM | POA: Diagnosis not present

## 2018-07-15 DIAGNOSIS — I69959 Hemiplegia and hemiparesis following unspecified cerebrovascular disease affecting unspecified side: Secondary | ICD-10-CM | POA: Diagnosis not present

## 2018-07-15 DIAGNOSIS — M6281 Muscle weakness (generalized): Secondary | ICD-10-CM | POA: Diagnosis not present

## 2018-07-15 DIAGNOSIS — R569 Unspecified convulsions: Secondary | ICD-10-CM | POA: Diagnosis not present

## 2018-07-15 DIAGNOSIS — R1312 Dysphagia, oropharyngeal phase: Secondary | ICD-10-CM | POA: Diagnosis not present

## 2018-07-16 DIAGNOSIS — J9690 Respiratory failure, unspecified, unspecified whether with hypoxia or hypercapnia: Secondary | ICD-10-CM | POA: Diagnosis not present

## 2018-07-16 DIAGNOSIS — M6281 Muscle weakness (generalized): Secondary | ICD-10-CM | POA: Diagnosis not present

## 2018-07-16 DIAGNOSIS — R1312 Dysphagia, oropharyngeal phase: Secondary | ICD-10-CM | POA: Diagnosis not present

## 2018-07-16 DIAGNOSIS — R2681 Unsteadiness on feet: Secondary | ICD-10-CM | POA: Diagnosis not present

## 2018-07-17 DIAGNOSIS — M6281 Muscle weakness (generalized): Secondary | ICD-10-CM | POA: Diagnosis not present

## 2018-07-17 DIAGNOSIS — J9690 Respiratory failure, unspecified, unspecified whether with hypoxia or hypercapnia: Secondary | ICD-10-CM | POA: Diagnosis not present

## 2018-07-17 DIAGNOSIS — R1312 Dysphagia, oropharyngeal phase: Secondary | ICD-10-CM | POA: Diagnosis not present

## 2018-07-17 DIAGNOSIS — R2681 Unsteadiness on feet: Secondary | ICD-10-CM | POA: Diagnosis not present

## 2018-07-18 DIAGNOSIS — R2681 Unsteadiness on feet: Secondary | ICD-10-CM | POA: Diagnosis not present

## 2018-07-18 DIAGNOSIS — R1312 Dysphagia, oropharyngeal phase: Secondary | ICD-10-CM | POA: Diagnosis not present

## 2018-07-18 DIAGNOSIS — M6281 Muscle weakness (generalized): Secondary | ICD-10-CM | POA: Diagnosis not present

## 2018-07-18 DIAGNOSIS — J9690 Respiratory failure, unspecified, unspecified whether with hypoxia or hypercapnia: Secondary | ICD-10-CM | POA: Diagnosis not present

## 2018-07-19 DIAGNOSIS — J9690 Respiratory failure, unspecified, unspecified whether with hypoxia or hypercapnia: Secondary | ICD-10-CM | POA: Diagnosis not present

## 2018-07-19 DIAGNOSIS — R2681 Unsteadiness on feet: Secondary | ICD-10-CM | POA: Diagnosis not present

## 2018-07-19 DIAGNOSIS — R1312 Dysphagia, oropharyngeal phase: Secondary | ICD-10-CM | POA: Diagnosis not present

## 2018-07-19 DIAGNOSIS — M6281 Muscle weakness (generalized): Secondary | ICD-10-CM | POA: Diagnosis not present

## 2018-07-20 DIAGNOSIS — R1312 Dysphagia, oropharyngeal phase: Secondary | ICD-10-CM | POA: Diagnosis not present

## 2018-07-20 DIAGNOSIS — J9690 Respiratory failure, unspecified, unspecified whether with hypoxia or hypercapnia: Secondary | ICD-10-CM | POA: Diagnosis not present

## 2018-07-20 DIAGNOSIS — R2681 Unsteadiness on feet: Secondary | ICD-10-CM | POA: Diagnosis not present

## 2018-07-20 DIAGNOSIS — M6281 Muscle weakness (generalized): Secondary | ICD-10-CM | POA: Diagnosis not present

## 2018-07-21 DIAGNOSIS — R1312 Dysphagia, oropharyngeal phase: Secondary | ICD-10-CM | POA: Diagnosis not present

## 2018-07-21 DIAGNOSIS — R2681 Unsteadiness on feet: Secondary | ICD-10-CM | POA: Diagnosis not present

## 2018-07-21 DIAGNOSIS — M6281 Muscle weakness (generalized): Secondary | ICD-10-CM | POA: Diagnosis not present

## 2018-07-21 DIAGNOSIS — J9690 Respiratory failure, unspecified, unspecified whether with hypoxia or hypercapnia: Secondary | ICD-10-CM | POA: Diagnosis not present

## 2018-07-22 DIAGNOSIS — J9690 Respiratory failure, unspecified, unspecified whether with hypoxia or hypercapnia: Secondary | ICD-10-CM | POA: Diagnosis not present

## 2018-07-22 DIAGNOSIS — M6281 Muscle weakness (generalized): Secondary | ICD-10-CM | POA: Diagnosis not present

## 2018-07-22 DIAGNOSIS — R1312 Dysphagia, oropharyngeal phase: Secondary | ICD-10-CM | POA: Diagnosis not present

## 2018-07-22 DIAGNOSIS — R2681 Unsteadiness on feet: Secondary | ICD-10-CM | POA: Diagnosis not present

## 2018-07-23 DIAGNOSIS — J9690 Respiratory failure, unspecified, unspecified whether with hypoxia or hypercapnia: Secondary | ICD-10-CM | POA: Diagnosis not present

## 2018-07-23 DIAGNOSIS — R1312 Dysphagia, oropharyngeal phase: Secondary | ICD-10-CM | POA: Diagnosis not present

## 2018-07-23 DIAGNOSIS — R2681 Unsteadiness on feet: Secondary | ICD-10-CM | POA: Diagnosis not present

## 2018-07-23 DIAGNOSIS — M6281 Muscle weakness (generalized): Secondary | ICD-10-CM | POA: Diagnosis not present

## 2018-07-24 DIAGNOSIS — R2681 Unsteadiness on feet: Secondary | ICD-10-CM | POA: Diagnosis not present

## 2018-07-24 DIAGNOSIS — R1312 Dysphagia, oropharyngeal phase: Secondary | ICD-10-CM | POA: Diagnosis not present

## 2018-07-24 DIAGNOSIS — J9690 Respiratory failure, unspecified, unspecified whether with hypoxia or hypercapnia: Secondary | ICD-10-CM | POA: Diagnosis not present

## 2018-07-24 DIAGNOSIS — M6281 Muscle weakness (generalized): Secondary | ICD-10-CM | POA: Diagnosis not present

## 2018-07-25 DIAGNOSIS — M6281 Muscle weakness (generalized): Secondary | ICD-10-CM | POA: Diagnosis not present

## 2018-07-25 DIAGNOSIS — R1312 Dysphagia, oropharyngeal phase: Secondary | ICD-10-CM | POA: Diagnosis not present

## 2018-07-25 DIAGNOSIS — R2681 Unsteadiness on feet: Secondary | ICD-10-CM | POA: Diagnosis not present

## 2018-07-25 DIAGNOSIS — J9690 Respiratory failure, unspecified, unspecified whether with hypoxia or hypercapnia: Secondary | ICD-10-CM | POA: Diagnosis not present

## 2018-07-26 DIAGNOSIS — R2681 Unsteadiness on feet: Secondary | ICD-10-CM | POA: Diagnosis not present

## 2018-07-26 DIAGNOSIS — R1312 Dysphagia, oropharyngeal phase: Secondary | ICD-10-CM | POA: Diagnosis not present

## 2018-07-26 DIAGNOSIS — J9690 Respiratory failure, unspecified, unspecified whether with hypoxia or hypercapnia: Secondary | ICD-10-CM | POA: Diagnosis not present

## 2018-07-26 DIAGNOSIS — M6281 Muscle weakness (generalized): Secondary | ICD-10-CM | POA: Diagnosis not present

## 2018-07-28 DIAGNOSIS — R2681 Unsteadiness on feet: Secondary | ICD-10-CM | POA: Diagnosis not present

## 2018-07-28 DIAGNOSIS — M6281 Muscle weakness (generalized): Secondary | ICD-10-CM | POA: Diagnosis not present

## 2018-07-28 DIAGNOSIS — J9690 Respiratory failure, unspecified, unspecified whether with hypoxia or hypercapnia: Secondary | ICD-10-CM | POA: Diagnosis not present

## 2018-07-28 DIAGNOSIS — R1312 Dysphagia, oropharyngeal phase: Secondary | ICD-10-CM | POA: Diagnosis not present

## 2018-07-29 DIAGNOSIS — R1312 Dysphagia, oropharyngeal phase: Secondary | ICD-10-CM | POA: Diagnosis not present

## 2018-07-29 DIAGNOSIS — R2681 Unsteadiness on feet: Secondary | ICD-10-CM | POA: Diagnosis not present

## 2018-07-29 DIAGNOSIS — M6281 Muscle weakness (generalized): Secondary | ICD-10-CM | POA: Diagnosis not present

## 2018-07-29 DIAGNOSIS — J9690 Respiratory failure, unspecified, unspecified whether with hypoxia or hypercapnia: Secondary | ICD-10-CM | POA: Diagnosis not present

## 2018-07-30 DIAGNOSIS — R1312 Dysphagia, oropharyngeal phase: Secondary | ICD-10-CM | POA: Diagnosis not present

## 2018-07-30 DIAGNOSIS — R2681 Unsteadiness on feet: Secondary | ICD-10-CM | POA: Diagnosis not present

## 2018-07-30 DIAGNOSIS — J9690 Respiratory failure, unspecified, unspecified whether with hypoxia or hypercapnia: Secondary | ICD-10-CM | POA: Diagnosis not present

## 2018-07-30 DIAGNOSIS — M6281 Muscle weakness (generalized): Secondary | ICD-10-CM | POA: Diagnosis not present

## 2018-07-31 DIAGNOSIS — R2681 Unsteadiness on feet: Secondary | ICD-10-CM | POA: Diagnosis not present

## 2018-07-31 DIAGNOSIS — J9690 Respiratory failure, unspecified, unspecified whether with hypoxia or hypercapnia: Secondary | ICD-10-CM | POA: Diagnosis not present

## 2018-07-31 DIAGNOSIS — M6281 Muscle weakness (generalized): Secondary | ICD-10-CM | POA: Diagnosis not present

## 2018-07-31 DIAGNOSIS — R1312 Dysphagia, oropharyngeal phase: Secondary | ICD-10-CM | POA: Diagnosis not present

## 2018-08-01 DIAGNOSIS — R1312 Dysphagia, oropharyngeal phase: Secondary | ICD-10-CM | POA: Diagnosis not present

## 2018-08-01 DIAGNOSIS — R2681 Unsteadiness on feet: Secondary | ICD-10-CM | POA: Diagnosis not present

## 2018-08-01 DIAGNOSIS — M6281 Muscle weakness (generalized): Secondary | ICD-10-CM | POA: Diagnosis not present

## 2018-08-01 DIAGNOSIS — J9690 Respiratory failure, unspecified, unspecified whether with hypoxia or hypercapnia: Secondary | ICD-10-CM | POA: Diagnosis not present

## 2018-08-04 DIAGNOSIS — J9690 Respiratory failure, unspecified, unspecified whether with hypoxia or hypercapnia: Secondary | ICD-10-CM | POA: Diagnosis not present

## 2018-08-04 DIAGNOSIS — R2681 Unsteadiness on feet: Secondary | ICD-10-CM | POA: Diagnosis not present

## 2018-08-04 DIAGNOSIS — R1312 Dysphagia, oropharyngeal phase: Secondary | ICD-10-CM | POA: Diagnosis not present

## 2018-08-04 DIAGNOSIS — M6281 Muscle weakness (generalized): Secondary | ICD-10-CM | POA: Diagnosis not present

## 2018-08-05 DIAGNOSIS — R1312 Dysphagia, oropharyngeal phase: Secondary | ICD-10-CM | POA: Diagnosis not present

## 2018-08-05 DIAGNOSIS — M6281 Muscle weakness (generalized): Secondary | ICD-10-CM | POA: Diagnosis not present

## 2018-08-05 DIAGNOSIS — J9690 Respiratory failure, unspecified, unspecified whether with hypoxia or hypercapnia: Secondary | ICD-10-CM | POA: Diagnosis not present

## 2018-08-05 DIAGNOSIS — R2681 Unsteadiness on feet: Secondary | ICD-10-CM | POA: Diagnosis not present

## 2018-08-06 DIAGNOSIS — R1312 Dysphagia, oropharyngeal phase: Secondary | ICD-10-CM | POA: Diagnosis not present

## 2018-08-06 DIAGNOSIS — M6281 Muscle weakness (generalized): Secondary | ICD-10-CM | POA: Diagnosis not present

## 2018-08-06 DIAGNOSIS — J9690 Respiratory failure, unspecified, unspecified whether with hypoxia or hypercapnia: Secondary | ICD-10-CM | POA: Diagnosis not present

## 2018-08-06 DIAGNOSIS — R2681 Unsteadiness on feet: Secondary | ICD-10-CM | POA: Diagnosis not present

## 2018-08-07 ENCOUNTER — Emergency Department (HOSPITAL_COMMUNITY)
Admission: EM | Admit: 2018-08-07 | Discharge: 2018-08-08 | Disposition: A | Discharge status: Home / Self Care | Payer: Medicare Other | Attending: Emergency Medicine | Admitting: Emergency Medicine

## 2018-08-07 ENCOUNTER — Other Ambulatory Visit: Payer: Self-pay

## 2018-08-07 DIAGNOSIS — Z87891 Personal history of nicotine dependence: Secondary | ICD-10-CM | POA: Insufficient documentation

## 2018-08-07 DIAGNOSIS — N3001 Acute cystitis with hematuria: Secondary | ICD-10-CM | POA: Diagnosis not present

## 2018-08-07 DIAGNOSIS — W19XXXA Unspecified fall, initial encounter: Secondary | ICD-10-CM | POA: Diagnosis not present

## 2018-08-07 DIAGNOSIS — R1312 Dysphagia, oropharyngeal phase: Secondary | ICD-10-CM | POA: Diagnosis not present

## 2018-08-07 DIAGNOSIS — Y999 Unspecified external cause status: Secondary | ICD-10-CM | POA: Insufficient documentation

## 2018-08-07 DIAGNOSIS — Z7901 Long term (current) use of anticoagulants: Secondary | ICD-10-CM | POA: Insufficient documentation

## 2018-08-07 DIAGNOSIS — J9690 Respiratory failure, unspecified, unspecified whether with hypoxia or hypercapnia: Secondary | ICD-10-CM | POA: Diagnosis not present

## 2018-08-07 DIAGNOSIS — Z20828 Contact with and (suspected) exposure to other viral communicable diseases: Secondary | ICD-10-CM | POA: Insufficient documentation

## 2018-08-07 DIAGNOSIS — F0390 Unspecified dementia without behavioral disturbance: Secondary | ICD-10-CM | POA: Insufficient documentation

## 2018-08-07 DIAGNOSIS — Z79899 Other long term (current) drug therapy: Secondary | ICD-10-CM | POA: Diagnosis not present

## 2018-08-07 DIAGNOSIS — W06XXXA Fall from bed, initial encounter: Secondary | ICD-10-CM | POA: Diagnosis not present

## 2018-08-07 DIAGNOSIS — Z03818 Encounter for observation for suspected exposure to other biological agents ruled out: Secondary | ICD-10-CM | POA: Diagnosis not present

## 2018-08-07 DIAGNOSIS — J449 Chronic obstructive pulmonary disease, unspecified: Secondary | ICD-10-CM | POA: Diagnosis not present

## 2018-08-07 DIAGNOSIS — Y92122 Bedroom in nursing home as the place of occurrence of the external cause: Secondary | ICD-10-CM | POA: Insufficient documentation

## 2018-08-07 DIAGNOSIS — Z043 Encounter for examination and observation following other accident: Secondary | ICD-10-CM | POA: Diagnosis not present

## 2018-08-07 DIAGNOSIS — S199XXA Unspecified injury of neck, initial encounter: Secondary | ICD-10-CM | POA: Diagnosis not present

## 2018-08-07 DIAGNOSIS — I10 Essential (primary) hypertension: Secondary | ICD-10-CM | POA: Insufficient documentation

## 2018-08-07 DIAGNOSIS — M6281 Muscle weakness (generalized): Secondary | ICD-10-CM | POA: Diagnosis not present

## 2018-08-07 DIAGNOSIS — R0682 Tachypnea, not elsewhere classified: Secondary | ICD-10-CM | POA: Diagnosis not present

## 2018-08-07 DIAGNOSIS — R404 Transient alteration of awareness: Secondary | ICD-10-CM | POA: Diagnosis not present

## 2018-08-07 DIAGNOSIS — I739 Peripheral vascular disease, unspecified: Secondary | ICD-10-CM | POA: Insufficient documentation

## 2018-08-07 DIAGNOSIS — R2681 Unsteadiness on feet: Secondary | ICD-10-CM | POA: Diagnosis not present

## 2018-08-07 DIAGNOSIS — R569 Unspecified convulsions: Secondary | ICD-10-CM | POA: Diagnosis not present

## 2018-08-07 DIAGNOSIS — S0990XA Unspecified injury of head, initial encounter: Secondary | ICD-10-CM | POA: Diagnosis not present

## 2018-08-07 DIAGNOSIS — Y939 Activity, unspecified: Secondary | ICD-10-CM | POA: Diagnosis not present

## 2018-08-07 DIAGNOSIS — R51 Headache: Secondary | ICD-10-CM | POA: Diagnosis not present

## 2018-08-07 NOTE — ED Triage Notes (Signed)
Pt BIB [facility] from [facility], history of dementia. Here because per staff he was found down beside his bed. Per his roommate he just rolled out of bed. Per EMS the fall was about 1 ft. He is on plavix, AO x2, denis any pain, headache, numbness, tingling, and changes in vision.

## 2018-08-08 ENCOUNTER — Emergency Department (HOSPITAL_COMMUNITY): Payer: Medicare Other

## 2018-08-08 DIAGNOSIS — J9690 Respiratory failure, unspecified, unspecified whether with hypoxia or hypercapnia: Secondary | ICD-10-CM | POA: Diagnosis not present

## 2018-08-08 DIAGNOSIS — M2550 Pain in unspecified joint: Secondary | ICD-10-CM | POA: Diagnosis not present

## 2018-08-08 DIAGNOSIS — N3000 Acute cystitis without hematuria: Secondary | ICD-10-CM | POA: Diagnosis not present

## 2018-08-08 DIAGNOSIS — M6281 Muscle weakness (generalized): Secondary | ICD-10-CM | POA: Diagnosis not present

## 2018-08-08 DIAGNOSIS — A498 Other bacterial infections of unspecified site: Secondary | ICD-10-CM | POA: Diagnosis not present

## 2018-08-08 DIAGNOSIS — R1312 Dysphagia, oropharyngeal phase: Secondary | ICD-10-CM | POA: Diagnosis not present

## 2018-08-08 DIAGNOSIS — S199XXA Unspecified injury of neck, initial encounter: Secondary | ICD-10-CM | POA: Diagnosis not present

## 2018-08-08 DIAGNOSIS — R0682 Tachypnea, not elsewhere classified: Secondary | ICD-10-CM | POA: Diagnosis not present

## 2018-08-08 DIAGNOSIS — S0990XA Unspecified injury of head, initial encounter: Secondary | ICD-10-CM | POA: Diagnosis not present

## 2018-08-08 DIAGNOSIS — Z7401 Bed confinement status: Secondary | ICD-10-CM | POA: Diagnosis not present

## 2018-08-08 DIAGNOSIS — Z9119 Patient's noncompliance with other medical treatment and regimen: Secondary | ICD-10-CM | POA: Diagnosis not present

## 2018-08-08 DIAGNOSIS — R2681 Unsteadiness on feet: Secondary | ICD-10-CM | POA: Diagnosis not present

## 2018-08-08 DIAGNOSIS — W19XXXA Unspecified fall, initial encounter: Secondary | ICD-10-CM | POA: Diagnosis not present

## 2018-08-08 LAB — BASIC METABOLIC PANEL
Anion gap: 10 (ref 5–15)
BUN: 10 mg/dL (ref 8–23)
CO2: 28 mmol/L (ref 22–32)
Calcium: 9 mg/dL (ref 8.9–10.3)
Chloride: 102 mmol/L (ref 98–111)
Creatinine, Ser: 0.61 mg/dL (ref 0.61–1.24)
GFR calc Af Amer: >60 mL/min (ref >60)
GFR calc non Af Amer: >60 mL/min (ref >60)
Glucose, Bld: 90 mg/dL (ref 70–99)
Potassium: 4.3 mmol/L (ref 3.5–5.1)
Sodium: 140 mmol/L (ref 135–145)

## 2018-08-08 LAB — CBC WITH DIFFERENTIAL/PLATELET
Abs Immature Granulocytes: 0.03 10*3/uL (ref 0.00–0.07)
Basophils Absolute: 0 10*3/uL (ref 0.0–0.1)
Basophils Relative: 1 %
Eosinophils Absolute: 0.1 10*3/uL (ref 0.0–0.5)
Eosinophils Relative: 2 %
HCT: 42 % (ref 39.0–52.0)
Hemoglobin: 14 g/dL (ref 13.0–17.0)
Immature Granulocytes: 0 %
Lymphocytes Relative: 31 %
Lymphs Abs: 2.6 10*3/uL (ref 0.7–4.0)
MCH: 32.8 pg (ref 26.0–34.0)
MCHC: 33.3 g/dL (ref 30.0–36.0)
MCV: 98.4 fL (ref 80.0–100.0)
Monocytes Absolute: 0.7 10*3/uL (ref 0.1–1.0)
Monocytes Relative: 8 %
Neutro Abs: 5 10*3/uL (ref 1.7–7.7)
Neutrophils Relative %: 58 %
Platelets: 343 10*3/uL (ref 150–400)
RBC: 4.27 MIL/uL (ref 4.22–5.81)
RDW: 14.8 % (ref 11.5–15.5)
WBC: 8.5 10*3/uL (ref 4.0–10.5)
nRBC: 0 % (ref 0.0–0.2)

## 2018-08-08 LAB — URINALYSIS, ROUTINE W REFLEX MICROSCOPIC
Bilirubin Urine: NEGATIVE
Glucose, UA: NEGATIVE mg/dL
Hgb urine dipstick: NEGATIVE
Ketones, ur: NEGATIVE mg/dL
Nitrite: POSITIVE — AB
Protein, ur: NEGATIVE mg/dL
Specific Gravity, Urine: 1.025 (ref 1.005–1.030)
WBC, UA: >50 WBC/hpf — ABNORMAL HIGH (ref 0–5)
pH: 7 (ref 5.0–8.0)

## 2018-08-08 LAB — SARS CORONAVIRUS 2 BY RT PCR (HOSPITAL ORDER, PERFORMED IN ~~LOC~~ HOSPITAL LAB): SARS Coronavirus 2: NEGATIVE

## 2018-08-08 MED ORDER — SODIUM CHLORIDE 0.9 % IV SOLN
1.0000 g | Freq: Once | INTRAVENOUS | Status: AC
  Administered 2018-08-08: 1 g via INTRAVENOUS
  Filled 2018-08-08: qty 10

## 2018-08-08 MED ORDER — LORAZEPAM 2 MG/ML IJ SOLN
1.0000 mg | Freq: Once | INTRAMUSCULAR | Status: DC
  Filled 2018-08-08: qty 1

## 2018-08-08 MED ORDER — LORAZEPAM 2 MG/ML IJ SOLN
0.5000 mg | Freq: Once | INTRAMUSCULAR | Status: AC
  Administered 2018-08-08: 0.5 mg via INTRAMUSCULAR
  Filled 2018-08-08: qty 1

## 2018-08-08 MED ORDER — CEPHALEXIN 500 MG PO CAPS: 500.0000 mg | ORAL_CAPSULE | Freq: Four times a day (QID) | ORAL | 0 refills | Status: DC

## 2018-08-08 MED FILL — CEPHALEXIN 500 MG CAPSULE: 500 | 5 days supply | Qty: 20 | Fill #0

## 2018-08-08 NOTE — ED Provider Notes (Signed)
Patient seen/examined in the Emergency Department in conjunction with Advanced Practice Provider  Patient presents with fall at nursing home and cough and SOB Exam : awake/alert, appears chronically ill, no distress noted Plan: labs/imaging pending at this time     Ripley Fraise, MD 08/08/18 [phone]
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
## 2018-08-08 NOTE — Discharge Instructions (Addendum)
1. Medications: Keflex, usual home medications 2. Treatment: rest, drink plenty of fluids, take medications as prescribed 3. Follow Up: Please followup with your primary doctor in 3 days for discussion of your diagnoses and further evaluation after today's visit; if you do not have a primary care doctor use the resource guide provided to find one; return to the ER for fevers, persistent vomiting, worsening abdominal pain, confusion or other concerning symptoms.

## 2018-08-08 NOTE — ED Notes (Addendum)
Tech attempted to reassess pt. Vitals and assist with dressing for transport. Patient proceeded to use profanity, attempted to swing at tech, and told nurse she was "going to get her feelings hurt"

## 2018-08-11 DIAGNOSIS — R1312 Dysphagia, oropharyngeal phase: Secondary | ICD-10-CM | POA: Diagnosis not present

## 2018-08-11 DIAGNOSIS — I69959 Hemiplegia and hemiparesis following unspecified cerebrovascular disease affecting unspecified side: Secondary | ICD-10-CM | POA: Diagnosis not present

## 2018-08-11 DIAGNOSIS — R569 Unspecified convulsions: Secondary | ICD-10-CM | POA: Diagnosis not present

## 2018-08-11 DIAGNOSIS — M79661 Pain in right lower leg: Secondary | ICD-10-CM | POA: Diagnosis not present

## 2018-08-11 DIAGNOSIS — M79662 Pain in left lower leg: Secondary | ICD-10-CM | POA: Diagnosis not present

## 2018-08-11 DIAGNOSIS — R278 Other lack of coordination: Secondary | ICD-10-CM | POA: Diagnosis not present

## 2018-08-11 DIAGNOSIS — R1319 Other dysphagia: Secondary | ICD-10-CM | POA: Diagnosis not present

## 2018-08-11 DIAGNOSIS — R2681 Unsteadiness on feet: Secondary | ICD-10-CM | POA: Diagnosis not present

## 2018-08-11 DIAGNOSIS — J9690 Respiratory failure, unspecified, unspecified whether with hypoxia or hypercapnia: Secondary | ICD-10-CM | POA: Diagnosis not present

## 2018-08-11 DIAGNOSIS — U071 COVID-19: Secondary | ICD-10-CM | POA: Diagnosis not present

## 2018-08-12 DIAGNOSIS — R278 Other lack of coordination: Secondary | ICD-10-CM | POA: Diagnosis not present

## 2018-08-12 DIAGNOSIS — R1319 Other dysphagia: Secondary | ICD-10-CM | POA: Diagnosis not present

## 2018-08-12 DIAGNOSIS — R1312 Dysphagia, oropharyngeal phase: Secondary | ICD-10-CM | POA: Diagnosis not present

## 2018-08-12 DIAGNOSIS — R2681 Unsteadiness on feet: Secondary | ICD-10-CM | POA: Diagnosis not present

## 2018-08-12 DIAGNOSIS — M79662 Pain in left lower leg: Secondary | ICD-10-CM | POA: Diagnosis not present

## 2018-08-12 DIAGNOSIS — U071 COVID-19: Secondary | ICD-10-CM | POA: Diagnosis not present

## 2018-08-12 DIAGNOSIS — M79661 Pain in right lower leg: Secondary | ICD-10-CM | POA: Diagnosis not present

## 2018-08-12 DIAGNOSIS — J9690 Respiratory failure, unspecified, unspecified whether with hypoxia or hypercapnia: Secondary | ICD-10-CM | POA: Diagnosis not present

## 2018-08-13 DIAGNOSIS — R1319 Other dysphagia: Secondary | ICD-10-CM | POA: Diagnosis not present

## 2018-08-13 DIAGNOSIS — U071 COVID-19: Secondary | ICD-10-CM | POA: Diagnosis not present

## 2018-08-13 DIAGNOSIS — J9690 Respiratory failure, unspecified, unspecified whether with hypoxia or hypercapnia: Secondary | ICD-10-CM | POA: Diagnosis not present

## 2018-08-13 DIAGNOSIS — M79662 Pain in left lower leg: Secondary | ICD-10-CM | POA: Diagnosis not present

## 2018-08-13 DIAGNOSIS — R278 Other lack of coordination: Secondary | ICD-10-CM | POA: Diagnosis not present

## 2018-08-13 DIAGNOSIS — R1312 Dysphagia, oropharyngeal phase: Secondary | ICD-10-CM | POA: Diagnosis not present

## 2018-08-13 DIAGNOSIS — M79661 Pain in right lower leg: Secondary | ICD-10-CM | POA: Diagnosis not present

## 2018-08-13 DIAGNOSIS — R2681 Unsteadiness on feet: Secondary | ICD-10-CM | POA: Diagnosis not present

## 2018-08-14 DIAGNOSIS — R1312 Dysphagia, oropharyngeal phase: Secondary | ICD-10-CM | POA: Diagnosis not present

## 2018-08-14 DIAGNOSIS — R278 Other lack of coordination: Secondary | ICD-10-CM | POA: Diagnosis not present

## 2018-08-14 DIAGNOSIS — R1319 Other dysphagia: Secondary | ICD-10-CM | POA: Diagnosis not present

## 2018-08-14 DIAGNOSIS — M79661 Pain in right lower leg: Secondary | ICD-10-CM | POA: Diagnosis not present

## 2018-08-14 DIAGNOSIS — J9690 Respiratory failure, unspecified, unspecified whether with hypoxia or hypercapnia: Secondary | ICD-10-CM | POA: Diagnosis not present

## 2018-08-14 DIAGNOSIS — R2681 Unsteadiness on feet: Secondary | ICD-10-CM | POA: Diagnosis not present

## 2018-08-14 DIAGNOSIS — U071 COVID-19: Secondary | ICD-10-CM | POA: Diagnosis not present

## 2018-08-14 DIAGNOSIS — M79662 Pain in left lower leg: Secondary | ICD-10-CM | POA: Diagnosis not present

## 2018-08-15 DIAGNOSIS — J9690 Respiratory failure, unspecified, unspecified whether with hypoxia or hypercapnia: Secondary | ICD-10-CM | POA: Diagnosis not present

## 2018-08-15 DIAGNOSIS — R2681 Unsteadiness on feet: Secondary | ICD-10-CM | POA: Diagnosis not present

## 2018-08-15 DIAGNOSIS — M79661 Pain in right lower leg: Secondary | ICD-10-CM | POA: Diagnosis not present

## 2018-08-15 DIAGNOSIS — R278 Other lack of coordination: Secondary | ICD-10-CM | POA: Diagnosis not present

## 2018-08-15 DIAGNOSIS — R1319 Other dysphagia: Secondary | ICD-10-CM | POA: Diagnosis not present

## 2018-08-15 DIAGNOSIS — R1312 Dysphagia, oropharyngeal phase: Secondary | ICD-10-CM | POA: Diagnosis not present

## 2018-08-15 DIAGNOSIS — U071 COVID-19: Secondary | ICD-10-CM | POA: Diagnosis not present

## 2018-08-15 DIAGNOSIS — M79662 Pain in left lower leg: Secondary | ICD-10-CM | POA: Diagnosis not present

## 2018-08-18 DIAGNOSIS — M79662 Pain in left lower leg: Secondary | ICD-10-CM | POA: Diagnosis not present

## 2018-08-18 DIAGNOSIS — R278 Other lack of coordination: Secondary | ICD-10-CM | POA: Diagnosis not present

## 2018-08-18 DIAGNOSIS — M79661 Pain in right lower leg: Secondary | ICD-10-CM | POA: Diagnosis not present

## 2018-08-18 DIAGNOSIS — R1319 Other dysphagia: Secondary | ICD-10-CM | POA: Diagnosis not present

## 2018-08-18 DIAGNOSIS — J9690 Respiratory failure, unspecified, unspecified whether with hypoxia or hypercapnia: Secondary | ICD-10-CM | POA: Diagnosis not present

## 2018-08-18 DIAGNOSIS — R1312 Dysphagia, oropharyngeal phase: Secondary | ICD-10-CM | POA: Diagnosis not present

## 2018-08-18 DIAGNOSIS — U071 COVID-19: Secondary | ICD-10-CM | POA: Diagnosis not present

## 2018-08-18 DIAGNOSIS — R2681 Unsteadiness on feet: Secondary | ICD-10-CM | POA: Diagnosis not present

## 2018-08-19 DIAGNOSIS — R278 Other lack of coordination: Secondary | ICD-10-CM | POA: Diagnosis not present

## 2018-08-19 DIAGNOSIS — U071 COVID-19: Secondary | ICD-10-CM | POA: Diagnosis not present

## 2018-08-19 DIAGNOSIS — R2681 Unsteadiness on feet: Secondary | ICD-10-CM | POA: Diagnosis not present

## 2018-08-19 DIAGNOSIS — M79662 Pain in left lower leg: Secondary | ICD-10-CM | POA: Diagnosis not present

## 2018-08-19 DIAGNOSIS — M79661 Pain in right lower leg: Secondary | ICD-10-CM | POA: Diagnosis not present

## 2018-08-19 DIAGNOSIS — R1319 Other dysphagia: Secondary | ICD-10-CM | POA: Diagnosis not present

## 2018-08-19 DIAGNOSIS — J9690 Respiratory failure, unspecified, unspecified whether with hypoxia or hypercapnia: Secondary | ICD-10-CM | POA: Diagnosis not present

## 2018-08-19 DIAGNOSIS — R1312 Dysphagia, oropharyngeal phase: Secondary | ICD-10-CM | POA: Diagnosis not present

## 2018-08-19 DIAGNOSIS — R296 Repeated falls: Secondary | ICD-10-CM | POA: Diagnosis not present

## 2018-08-19 DIAGNOSIS — R0789 Other chest pain: Secondary | ICD-10-CM | POA: Diagnosis not present

## 2018-08-20 DIAGNOSIS — M79661 Pain in right lower leg: Secondary | ICD-10-CM | POA: Diagnosis not present

## 2018-08-20 DIAGNOSIS — R278 Other lack of coordination: Secondary | ICD-10-CM | POA: Diagnosis not present

## 2018-08-20 DIAGNOSIS — R2681 Unsteadiness on feet: Secondary | ICD-10-CM | POA: Diagnosis not present

## 2018-08-20 DIAGNOSIS — U071 COVID-19: Secondary | ICD-10-CM | POA: Diagnosis not present

## 2018-08-20 DIAGNOSIS — R1312 Dysphagia, oropharyngeal phase: Secondary | ICD-10-CM | POA: Diagnosis not present

## 2018-08-20 DIAGNOSIS — R1319 Other dysphagia: Secondary | ICD-10-CM | POA: Diagnosis not present

## 2018-08-20 DIAGNOSIS — J9690 Respiratory failure, unspecified, unspecified whether with hypoxia or hypercapnia: Secondary | ICD-10-CM | POA: Diagnosis not present

## 2018-08-20 DIAGNOSIS — M79662 Pain in left lower leg: Secondary | ICD-10-CM | POA: Diagnosis not present

## 2018-08-21 DIAGNOSIS — R2681 Unsteadiness on feet: Secondary | ICD-10-CM | POA: Diagnosis not present

## 2018-08-21 DIAGNOSIS — J9690 Respiratory failure, unspecified, unspecified whether with hypoxia or hypercapnia: Secondary | ICD-10-CM | POA: Diagnosis not present

## 2018-08-21 DIAGNOSIS — M79661 Pain in right lower leg: Secondary | ICD-10-CM | POA: Diagnosis not present

## 2018-08-21 DIAGNOSIS — U071 COVID-19: Secondary | ICD-10-CM | POA: Diagnosis not present

## 2018-08-21 DIAGNOSIS — R278 Other lack of coordination: Secondary | ICD-10-CM | POA: Diagnosis not present

## 2018-08-21 DIAGNOSIS — R1312 Dysphagia, oropharyngeal phase: Secondary | ICD-10-CM | POA: Diagnosis not present

## 2018-08-21 DIAGNOSIS — R1319 Other dysphagia: Secondary | ICD-10-CM | POA: Diagnosis not present

## 2018-08-21 DIAGNOSIS — M79662 Pain in left lower leg: Secondary | ICD-10-CM | POA: Diagnosis not present

## 2018-08-22 DIAGNOSIS — U071 COVID-19: Secondary | ICD-10-CM | POA: Diagnosis not present

## 2018-08-22 DIAGNOSIS — R1319 Other dysphagia: Secondary | ICD-10-CM | POA: Diagnosis not present

## 2018-08-22 DIAGNOSIS — M79662 Pain in left lower leg: Secondary | ICD-10-CM | POA: Diagnosis not present

## 2018-08-22 DIAGNOSIS — R278 Other lack of coordination: Secondary | ICD-10-CM | POA: Diagnosis not present

## 2018-08-22 DIAGNOSIS — J9690 Respiratory failure, unspecified, unspecified whether with hypoxia or hypercapnia: Secondary | ICD-10-CM | POA: Diagnosis not present

## 2018-08-22 DIAGNOSIS — R2681 Unsteadiness on feet: Secondary | ICD-10-CM | POA: Diagnosis not present

## 2018-08-22 DIAGNOSIS — R1312 Dysphagia, oropharyngeal phase: Secondary | ICD-10-CM | POA: Diagnosis not present

## 2018-08-22 DIAGNOSIS — M79661 Pain in right lower leg: Secondary | ICD-10-CM | POA: Diagnosis not present

## 2018-08-23 DIAGNOSIS — R1312 Dysphagia, oropharyngeal phase: Secondary | ICD-10-CM | POA: Diagnosis not present

## 2018-08-23 DIAGNOSIS — U071 COVID-19: Secondary | ICD-10-CM | POA: Diagnosis not present

## 2018-08-23 DIAGNOSIS — J9690 Respiratory failure, unspecified, unspecified whether with hypoxia or hypercapnia: Secondary | ICD-10-CM | POA: Diagnosis not present

## 2018-08-23 DIAGNOSIS — R278 Other lack of coordination: Secondary | ICD-10-CM | POA: Diagnosis not present

## 2018-08-23 DIAGNOSIS — R2681 Unsteadiness on feet: Secondary | ICD-10-CM | POA: Diagnosis not present

## 2018-08-23 DIAGNOSIS — M79662 Pain in left lower leg: Secondary | ICD-10-CM | POA: Diagnosis not present

## 2018-08-23 DIAGNOSIS — M79661 Pain in right lower leg: Secondary | ICD-10-CM | POA: Diagnosis not present

## 2018-08-23 DIAGNOSIS — R1319 Other dysphagia: Secondary | ICD-10-CM | POA: Diagnosis not present

## 2018-08-26 DIAGNOSIS — R1312 Dysphagia, oropharyngeal phase: Secondary | ICD-10-CM | POA: Diagnosis not present

## 2018-08-26 DIAGNOSIS — M79662 Pain in left lower leg: Secondary | ICD-10-CM | POA: Diagnosis not present

## 2018-08-26 DIAGNOSIS — E568 Deficiency of other vitamins: Secondary | ICD-10-CM | POA: Diagnosis not present

## 2018-08-26 DIAGNOSIS — R2681 Unsteadiness on feet: Secondary | ICD-10-CM | POA: Diagnosis not present

## 2018-08-26 DIAGNOSIS — I6389 Other cerebral infarction: Secondary | ICD-10-CM | POA: Diagnosis not present

## 2018-08-26 DIAGNOSIS — M79661 Pain in right lower leg: Secondary | ICD-10-CM | POA: Diagnosis not present

## 2018-08-26 DIAGNOSIS — J9690 Respiratory failure, unspecified, unspecified whether with hypoxia or hypercapnia: Secondary | ICD-10-CM | POA: Diagnosis not present

## 2018-08-26 DIAGNOSIS — R278 Other lack of coordination: Secondary | ICD-10-CM | POA: Diagnosis not present

## 2018-08-26 DIAGNOSIS — R1319 Other dysphagia: Secondary | ICD-10-CM | POA: Diagnosis not present

## 2018-08-26 DIAGNOSIS — U071 COVID-19: Secondary | ICD-10-CM | POA: Diagnosis not present

## 2018-08-27 DIAGNOSIS — M79662 Pain in left lower leg: Secondary | ICD-10-CM | POA: Diagnosis not present

## 2018-08-27 DIAGNOSIS — J9690 Respiratory failure, unspecified, unspecified whether with hypoxia or hypercapnia: Secondary | ICD-10-CM | POA: Diagnosis not present

## 2018-08-27 DIAGNOSIS — R1319 Other dysphagia: Secondary | ICD-10-CM | POA: Diagnosis not present

## 2018-08-27 DIAGNOSIS — R2681 Unsteadiness on feet: Secondary | ICD-10-CM | POA: Diagnosis not present

## 2018-08-27 DIAGNOSIS — R278 Other lack of coordination: Secondary | ICD-10-CM | POA: Diagnosis not present

## 2018-08-27 DIAGNOSIS — R1312 Dysphagia, oropharyngeal phase: Secondary | ICD-10-CM | POA: Diagnosis not present

## 2018-08-27 DIAGNOSIS — U071 COVID-19: Secondary | ICD-10-CM | POA: Diagnosis not present

## 2018-08-27 DIAGNOSIS — M79661 Pain in right lower leg: Secondary | ICD-10-CM | POA: Diagnosis not present

## 2018-08-28 ENCOUNTER — Telehealth: Payer: Self-pay | Admitting: Family Medicine

## 2018-08-28 DIAGNOSIS — R1312 Dysphagia, oropharyngeal phase: Secondary | ICD-10-CM | POA: Diagnosis not present

## 2018-08-28 DIAGNOSIS — M79661 Pain in right lower leg: Secondary | ICD-10-CM | POA: Diagnosis not present

## 2018-08-28 DIAGNOSIS — R2681 Unsteadiness on feet: Secondary | ICD-10-CM | POA: Diagnosis not present

## 2018-08-28 DIAGNOSIS — R278 Other lack of coordination: Secondary | ICD-10-CM | POA: Diagnosis not present

## 2018-08-28 DIAGNOSIS — R1319 Other dysphagia: Secondary | ICD-10-CM | POA: Diagnosis not present

## 2018-08-28 DIAGNOSIS — J9690 Respiratory failure, unspecified, unspecified whether with hypoxia or hypercapnia: Secondary | ICD-10-CM | POA: Diagnosis not present

## 2018-08-28 DIAGNOSIS — M79662 Pain in left lower leg: Secondary | ICD-10-CM | POA: Diagnosis not present

## 2018-08-28 DIAGNOSIS — U071 COVID-19: Secondary | ICD-10-CM | POA: Diagnosis not present

## 2018-08-28 NOTE — Telephone Encounter (Signed)
I called patient and LVM regarding rescheduling 9/4 appt due to [facility] being closed this day (Friday). I requested patient call back to reschedule.

## 2018-08-29 DIAGNOSIS — R2681 Unsteadiness on feet: Secondary | ICD-10-CM | POA: Diagnosis not present

## 2018-08-29 DIAGNOSIS — M79662 Pain in left lower leg: Secondary | ICD-10-CM | POA: Diagnosis not present

## 2018-08-29 DIAGNOSIS — R1319 Other dysphagia: Secondary | ICD-10-CM | POA: Diagnosis not present

## 2018-08-29 DIAGNOSIS — U071 COVID-19: Secondary | ICD-10-CM | POA: Diagnosis not present

## 2018-08-29 DIAGNOSIS — R278 Other lack of coordination: Secondary | ICD-10-CM | POA: Diagnosis not present

## 2018-08-29 DIAGNOSIS — M79661 Pain in right lower leg: Secondary | ICD-10-CM | POA: Diagnosis not present

## 2018-08-29 DIAGNOSIS — J9690 Respiratory failure, unspecified, unspecified whether with hypoxia or hypercapnia: Secondary | ICD-10-CM | POA: Diagnosis not present

## 2018-08-29 DIAGNOSIS — R1312 Dysphagia, oropharyngeal phase: Secondary | ICD-10-CM | POA: Diagnosis not present

## 2018-08-30 DIAGNOSIS — R2681 Unsteadiness on feet: Secondary | ICD-10-CM | POA: Diagnosis not present

## 2018-08-30 DIAGNOSIS — J9690 Respiratory failure, unspecified, unspecified whether with hypoxia or hypercapnia: Secondary | ICD-10-CM | POA: Diagnosis not present

## 2018-08-30 DIAGNOSIS — R1319 Other dysphagia: Secondary | ICD-10-CM | POA: Diagnosis not present

## 2018-08-30 DIAGNOSIS — U071 COVID-19: Secondary | ICD-10-CM | POA: Diagnosis not present

## 2018-08-30 DIAGNOSIS — R1312 Dysphagia, oropharyngeal phase: Secondary | ICD-10-CM | POA: Diagnosis not present

## 2018-08-30 DIAGNOSIS — M79662 Pain in left lower leg: Secondary | ICD-10-CM | POA: Diagnosis not present

## 2018-08-30 DIAGNOSIS — R278 Other lack of coordination: Secondary | ICD-10-CM | POA: Diagnosis not present

## 2018-08-30 DIAGNOSIS — M79661 Pain in right lower leg: Secondary | ICD-10-CM | POA: Diagnosis not present

## 2018-09-01 DIAGNOSIS — R2681 Unsteadiness on feet: Secondary | ICD-10-CM | POA: Diagnosis not present

## 2018-09-01 DIAGNOSIS — R1312 Dysphagia, oropharyngeal phase: Secondary | ICD-10-CM | POA: Diagnosis not present

## 2018-09-01 DIAGNOSIS — R1319 Other dysphagia: Secondary | ICD-10-CM | POA: Diagnosis not present

## 2018-09-01 DIAGNOSIS — M79662 Pain in left lower leg: Secondary | ICD-10-CM | POA: Diagnosis not present

## 2018-09-01 DIAGNOSIS — M79661 Pain in right lower leg: Secondary | ICD-10-CM | POA: Diagnosis not present

## 2018-09-01 DIAGNOSIS — J9690 Respiratory failure, unspecified, unspecified whether with hypoxia or hypercapnia: Secondary | ICD-10-CM | POA: Diagnosis not present

## 2018-09-01 DIAGNOSIS — R278 Other lack of coordination: Secondary | ICD-10-CM | POA: Diagnosis not present

## 2018-09-01 DIAGNOSIS — U071 COVID-19: Secondary | ICD-10-CM | POA: Diagnosis not present

## 2018-09-02 ENCOUNTER — Encounter: Payer: Self-pay | Admitting: Family Medicine

## 2018-09-02 DIAGNOSIS — M79661 Pain in right lower leg: Secondary | ICD-10-CM | POA: Diagnosis not present

## 2018-09-02 DIAGNOSIS — R2681 Unsteadiness on feet: Secondary | ICD-10-CM | POA: Diagnosis not present

## 2018-09-02 DIAGNOSIS — R1319 Other dysphagia: Secondary | ICD-10-CM | POA: Diagnosis not present

## 2018-09-02 DIAGNOSIS — J9690 Respiratory failure, unspecified, unspecified whether with hypoxia or hypercapnia: Secondary | ICD-10-CM | POA: Diagnosis not present

## 2018-09-02 DIAGNOSIS — U071 COVID-19: Secondary | ICD-10-CM | POA: Diagnosis not present

## 2018-09-02 DIAGNOSIS — R278 Other lack of coordination: Secondary | ICD-10-CM | POA: Diagnosis not present

## 2018-09-02 DIAGNOSIS — M79662 Pain in left lower leg: Secondary | ICD-10-CM | POA: Diagnosis not present

## 2018-09-02 DIAGNOSIS — R1312 Dysphagia, oropharyngeal phase: Secondary | ICD-10-CM | POA: Diagnosis not present

## 2018-09-03 DIAGNOSIS — U071 COVID-19: Secondary | ICD-10-CM | POA: Diagnosis not present

## 2018-09-03 DIAGNOSIS — R278 Other lack of coordination: Secondary | ICD-10-CM | POA: Diagnosis not present

## 2018-09-03 DIAGNOSIS — M79661 Pain in right lower leg: Secondary | ICD-10-CM | POA: Diagnosis not present

## 2018-09-03 DIAGNOSIS — R2681 Unsteadiness on feet: Secondary | ICD-10-CM | POA: Diagnosis not present

## 2018-09-03 DIAGNOSIS — M79662 Pain in left lower leg: Secondary | ICD-10-CM | POA: Diagnosis not present

## 2018-09-03 DIAGNOSIS — R1312 Dysphagia, oropharyngeal phase: Secondary | ICD-10-CM | POA: Diagnosis not present

## 2018-09-03 DIAGNOSIS — R1319 Other dysphagia: Secondary | ICD-10-CM | POA: Diagnosis not present

## 2018-09-03 DIAGNOSIS — J9690 Respiratory failure, unspecified, unspecified whether with hypoxia or hypercapnia: Secondary | ICD-10-CM | POA: Diagnosis not present

## 2018-09-04 DIAGNOSIS — M79662 Pain in left lower leg: Secondary | ICD-10-CM | POA: Diagnosis not present

## 2018-09-04 DIAGNOSIS — J9690 Respiratory failure, unspecified, unspecified whether with hypoxia or hypercapnia: Secondary | ICD-10-CM | POA: Diagnosis not present

## 2018-09-04 DIAGNOSIS — U071 COVID-19: Secondary | ICD-10-CM | POA: Diagnosis not present

## 2018-09-04 DIAGNOSIS — R2681 Unsteadiness on feet: Secondary | ICD-10-CM | POA: Diagnosis not present

## 2018-09-04 DIAGNOSIS — R278 Other lack of coordination: Secondary | ICD-10-CM | POA: Diagnosis not present

## 2018-09-04 DIAGNOSIS — R1319 Other dysphagia: Secondary | ICD-10-CM | POA: Diagnosis not present

## 2018-09-04 DIAGNOSIS — R1312 Dysphagia, oropharyngeal phase: Secondary | ICD-10-CM | POA: Diagnosis not present

## 2018-09-04 DIAGNOSIS — M79661 Pain in right lower leg: Secondary | ICD-10-CM | POA: Diagnosis not present

## 2018-09-05 DIAGNOSIS — R278 Other lack of coordination: Secondary | ICD-10-CM | POA: Diagnosis not present

## 2018-09-05 DIAGNOSIS — R2681 Unsteadiness on feet: Secondary | ICD-10-CM | POA: Diagnosis not present

## 2018-09-05 DIAGNOSIS — M79661 Pain in right lower leg: Secondary | ICD-10-CM | POA: Diagnosis not present

## 2018-09-05 DIAGNOSIS — U071 COVID-19: Secondary | ICD-10-CM | POA: Diagnosis not present

## 2018-09-05 DIAGNOSIS — R1312 Dysphagia, oropharyngeal phase: Secondary | ICD-10-CM | POA: Diagnosis not present

## 2018-09-05 DIAGNOSIS — R1319 Other dysphagia: Secondary | ICD-10-CM | POA: Diagnosis not present

## 2018-09-05 DIAGNOSIS — M79662 Pain in left lower leg: Secondary | ICD-10-CM | POA: Diagnosis not present

## 2018-09-05 DIAGNOSIS — J9690 Respiratory failure, unspecified, unspecified whether with hypoxia or hypercapnia: Secondary | ICD-10-CM | POA: Diagnosis not present

## 2018-09-08 DIAGNOSIS — R1319 Other dysphagia: Secondary | ICD-10-CM | POA: Diagnosis not present

## 2018-09-08 DIAGNOSIS — M79661 Pain in right lower leg: Secondary | ICD-10-CM | POA: Diagnosis not present

## 2018-09-08 DIAGNOSIS — R278 Other lack of coordination: Secondary | ICD-10-CM | POA: Diagnosis not present

## 2018-09-08 DIAGNOSIS — R2681 Unsteadiness on feet: Secondary | ICD-10-CM | POA: Diagnosis not present

## 2018-09-08 DIAGNOSIS — U071 COVID-19: Secondary | ICD-10-CM | POA: Diagnosis not present

## 2018-09-08 DIAGNOSIS — M79662 Pain in left lower leg: Secondary | ICD-10-CM | POA: Diagnosis not present

## 2018-09-08 DIAGNOSIS — R1312 Dysphagia, oropharyngeal phase: Secondary | ICD-10-CM | POA: Diagnosis not present

## 2018-09-08 DIAGNOSIS — J9690 Respiratory failure, unspecified, unspecified whether with hypoxia or hypercapnia: Secondary | ICD-10-CM | POA: Diagnosis not present

## 2018-09-09 DIAGNOSIS — R1312 Dysphagia, oropharyngeal phase: Secondary | ICD-10-CM | POA: Diagnosis not present

## 2018-09-09 DIAGNOSIS — R2681 Unsteadiness on feet: Secondary | ICD-10-CM | POA: Diagnosis not present

## 2018-09-09 DIAGNOSIS — R278 Other lack of coordination: Secondary | ICD-10-CM | POA: Diagnosis not present

## 2018-09-09 DIAGNOSIS — U071 COVID-19: Secondary | ICD-10-CM | POA: Diagnosis not present

## 2018-09-09 DIAGNOSIS — R1319 Other dysphagia: Secondary | ICD-10-CM | POA: Diagnosis not present

## 2018-09-09 DIAGNOSIS — M79661 Pain in right lower leg: Secondary | ICD-10-CM | POA: Diagnosis not present

## 2018-09-09 DIAGNOSIS — J9690 Respiratory failure, unspecified, unspecified whether with hypoxia or hypercapnia: Secondary | ICD-10-CM | POA: Diagnosis not present

## 2018-09-09 DIAGNOSIS — M79662 Pain in left lower leg: Secondary | ICD-10-CM | POA: Diagnosis not present

## 2018-09-10 DIAGNOSIS — R1319 Other dysphagia: Secondary | ICD-10-CM | POA: Diagnosis not present

## 2018-09-10 DIAGNOSIS — J9690 Respiratory failure, unspecified, unspecified whether with hypoxia or hypercapnia: Secondary | ICD-10-CM | POA: Diagnosis not present

## 2018-09-10 DIAGNOSIS — R1312 Dysphagia, oropharyngeal phase: Secondary | ICD-10-CM | POA: Diagnosis not present

## 2018-09-10 DIAGNOSIS — U071 COVID-19: Secondary | ICD-10-CM | POA: Diagnosis not present

## 2018-09-10 DIAGNOSIS — R2681 Unsteadiness on feet: Secondary | ICD-10-CM | POA: Diagnosis not present

## 2018-09-10 DIAGNOSIS — M79661 Pain in right lower leg: Secondary | ICD-10-CM | POA: Diagnosis not present

## 2018-09-10 DIAGNOSIS — R278 Other lack of coordination: Secondary | ICD-10-CM | POA: Diagnosis not present

## 2018-09-10 DIAGNOSIS — M79662 Pain in left lower leg: Secondary | ICD-10-CM | POA: Diagnosis not present

## 2018-09-11 DIAGNOSIS — R2681 Unsteadiness on feet: Secondary | ICD-10-CM | POA: Diagnosis not present

## 2018-09-11 DIAGNOSIS — U071 COVID-19: Secondary | ICD-10-CM | POA: Diagnosis not present

## 2018-09-11 DIAGNOSIS — M79661 Pain in right lower leg: Secondary | ICD-10-CM | POA: Diagnosis not present

## 2018-09-11 DIAGNOSIS — R278 Other lack of coordination: Secondary | ICD-10-CM | POA: Diagnosis not present

## 2018-09-11 DIAGNOSIS — M79662 Pain in left lower leg: Secondary | ICD-10-CM | POA: Diagnosis not present

## 2018-09-11 DIAGNOSIS — R1319 Other dysphagia: Secondary | ICD-10-CM | POA: Diagnosis not present

## 2018-09-11 DIAGNOSIS — R1312 Dysphagia, oropharyngeal phase: Secondary | ICD-10-CM | POA: Diagnosis not present

## 2018-09-11 DIAGNOSIS — J9690 Respiratory failure, unspecified, unspecified whether with hypoxia or hypercapnia: Secondary | ICD-10-CM | POA: Diagnosis not present

## 2018-09-12 ENCOUNTER — Ambulatory Visit: Payer: Self-pay | Admitting: Family Medicine

## 2018-09-12 DIAGNOSIS — R278 Other lack of coordination: Secondary | ICD-10-CM | POA: Diagnosis not present

## 2018-09-12 DIAGNOSIS — M79661 Pain in right lower leg: Secondary | ICD-10-CM | POA: Diagnosis not present

## 2018-09-12 DIAGNOSIS — R1312 Dysphagia, oropharyngeal phase: Secondary | ICD-10-CM | POA: Diagnosis not present

## 2018-09-12 DIAGNOSIS — J9690 Respiratory failure, unspecified, unspecified whether with hypoxia or hypercapnia: Secondary | ICD-10-CM | POA: Diagnosis not present

## 2018-09-12 DIAGNOSIS — M79662 Pain in left lower leg: Secondary | ICD-10-CM | POA: Diagnosis not present

## 2018-09-12 DIAGNOSIS — U071 COVID-19: Secondary | ICD-10-CM | POA: Diagnosis not present

## 2018-09-12 DIAGNOSIS — R1319 Other dysphagia: Secondary | ICD-10-CM | POA: Diagnosis not present

## 2018-09-12 DIAGNOSIS — R2681 Unsteadiness on feet: Secondary | ICD-10-CM | POA: Diagnosis not present

## 2018-09-15 DIAGNOSIS — R278 Other lack of coordination: Secondary | ICD-10-CM | POA: Diagnosis not present

## 2018-09-15 DIAGNOSIS — R1319 Other dysphagia: Secondary | ICD-10-CM | POA: Diagnosis not present

## 2018-09-15 DIAGNOSIS — R1312 Dysphagia, oropharyngeal phase: Secondary | ICD-10-CM | POA: Diagnosis not present

## 2018-09-15 DIAGNOSIS — M79661 Pain in right lower leg: Secondary | ICD-10-CM | POA: Diagnosis not present

## 2018-09-15 DIAGNOSIS — M79662 Pain in left lower leg: Secondary | ICD-10-CM | POA: Diagnosis not present

## 2018-09-15 DIAGNOSIS — J9690 Respiratory failure, unspecified, unspecified whether with hypoxia or hypercapnia: Secondary | ICD-10-CM | POA: Diagnosis not present

## 2018-09-15 DIAGNOSIS — R2681 Unsteadiness on feet: Secondary | ICD-10-CM | POA: Diagnosis not present

## 2018-09-15 DIAGNOSIS — U071 COVID-19: Secondary | ICD-10-CM | POA: Diagnosis not present

## 2018-09-16 DIAGNOSIS — R1312 Dysphagia, oropharyngeal phase: Secondary | ICD-10-CM | POA: Diagnosis not present

## 2018-09-16 DIAGNOSIS — M79662 Pain in left lower leg: Secondary | ICD-10-CM | POA: Diagnosis not present

## 2018-09-16 DIAGNOSIS — M79661 Pain in right lower leg: Secondary | ICD-10-CM | POA: Diagnosis not present

## 2018-09-16 DIAGNOSIS — J9690 Respiratory failure, unspecified, unspecified whether with hypoxia or hypercapnia: Secondary | ICD-10-CM | POA: Diagnosis not present

## 2018-09-16 DIAGNOSIS — R278 Other lack of coordination: Secondary | ICD-10-CM | POA: Diagnosis not present

## 2018-09-16 DIAGNOSIS — U071 COVID-19: Secondary | ICD-10-CM | POA: Diagnosis not present

## 2018-09-16 DIAGNOSIS — R2681 Unsteadiness on feet: Secondary | ICD-10-CM | POA: Diagnosis not present

## 2018-09-16 DIAGNOSIS — R1319 Other dysphagia: Secondary | ICD-10-CM | POA: Diagnosis not present

## 2018-09-17 DIAGNOSIS — U071 COVID-19: Secondary | ICD-10-CM | POA: Diagnosis not present

## 2018-09-17 DIAGNOSIS — R278 Other lack of coordination: Secondary | ICD-10-CM | POA: Diagnosis not present

## 2018-09-17 DIAGNOSIS — R1319 Other dysphagia: Secondary | ICD-10-CM | POA: Diagnosis not present

## 2018-09-17 DIAGNOSIS — J9690 Respiratory failure, unspecified, unspecified whether with hypoxia or hypercapnia: Secondary | ICD-10-CM | POA: Diagnosis not present

## 2018-09-17 DIAGNOSIS — M79661 Pain in right lower leg: Secondary | ICD-10-CM | POA: Diagnosis not present

## 2018-09-17 DIAGNOSIS — R2681 Unsteadiness on feet: Secondary | ICD-10-CM | POA: Diagnosis not present

## 2018-09-17 DIAGNOSIS — M79662 Pain in left lower leg: Secondary | ICD-10-CM | POA: Diagnosis not present

## 2018-09-17 DIAGNOSIS — R1312 Dysphagia, oropharyngeal phase: Secondary | ICD-10-CM | POA: Diagnosis not present

## 2018-09-18 DIAGNOSIS — Z9114 Patient's other noncompliance with medication regimen: Secondary | ICD-10-CM | POA: Diagnosis not present

## 2018-09-18 DIAGNOSIS — I10 Essential (primary) hypertension: Secondary | ICD-10-CM | POA: Diagnosis not present

## 2018-09-19 DIAGNOSIS — M79661 Pain in right lower leg: Secondary | ICD-10-CM | POA: Diagnosis not present

## 2018-09-19 DIAGNOSIS — R1312 Dysphagia, oropharyngeal phase: Secondary | ICD-10-CM | POA: Diagnosis not present

## 2018-09-19 DIAGNOSIS — M79662 Pain in left lower leg: Secondary | ICD-10-CM | POA: Diagnosis not present

## 2018-09-19 DIAGNOSIS — R1319 Other dysphagia: Secondary | ICD-10-CM | POA: Diagnosis not present

## 2018-09-19 DIAGNOSIS — R278 Other lack of coordination: Secondary | ICD-10-CM | POA: Diagnosis not present

## 2018-09-19 DIAGNOSIS — U071 COVID-19: Secondary | ICD-10-CM | POA: Diagnosis not present

## 2018-09-19 DIAGNOSIS — J9690 Respiratory failure, unspecified, unspecified whether with hypoxia or hypercapnia: Secondary | ICD-10-CM | POA: Diagnosis not present

## 2018-09-19 DIAGNOSIS — R2681 Unsteadiness on feet: Secondary | ICD-10-CM | POA: Diagnosis not present

## 2018-09-20 DIAGNOSIS — J9690 Respiratory failure, unspecified, unspecified whether with hypoxia or hypercapnia: Secondary | ICD-10-CM | POA: Diagnosis not present

## 2018-09-20 DIAGNOSIS — R1319 Other dysphagia: Secondary | ICD-10-CM | POA: Diagnosis not present

## 2018-09-20 DIAGNOSIS — R1312 Dysphagia, oropharyngeal phase: Secondary | ICD-10-CM | POA: Diagnosis not present

## 2018-09-20 DIAGNOSIS — M79661 Pain in right lower leg: Secondary | ICD-10-CM | POA: Diagnosis not present

## 2018-09-20 DIAGNOSIS — M79662 Pain in left lower leg: Secondary | ICD-10-CM | POA: Diagnosis not present

## 2018-09-20 DIAGNOSIS — U071 COVID-19: Secondary | ICD-10-CM | POA: Diagnosis not present

## 2018-09-20 DIAGNOSIS — R2681 Unsteadiness on feet: Secondary | ICD-10-CM | POA: Diagnosis not present

## 2018-09-20 DIAGNOSIS — R278 Other lack of coordination: Secondary | ICD-10-CM | POA: Diagnosis not present

## 2018-09-22 DIAGNOSIS — R1319 Other dysphagia: Secondary | ICD-10-CM | POA: Diagnosis not present

## 2018-09-22 DIAGNOSIS — M79661 Pain in right lower leg: Secondary | ICD-10-CM | POA: Diagnosis not present

## 2018-09-22 DIAGNOSIS — J9690 Respiratory failure, unspecified, unspecified whether with hypoxia or hypercapnia: Secondary | ICD-10-CM | POA: Diagnosis not present

## 2018-09-22 DIAGNOSIS — R1312 Dysphagia, oropharyngeal phase: Secondary | ICD-10-CM | POA: Diagnosis not present

## 2018-09-22 DIAGNOSIS — I69959 Hemiplegia and hemiparesis following unspecified cerebrovascular disease affecting unspecified side: Secondary | ICD-10-CM | POA: Diagnosis not present

## 2018-09-22 DIAGNOSIS — R278 Other lack of coordination: Secondary | ICD-10-CM | POA: Diagnosis not present

## 2018-09-22 DIAGNOSIS — R2681 Unsteadiness on feet: Secondary | ICD-10-CM | POA: Diagnosis not present

## 2018-09-22 DIAGNOSIS — U071 COVID-19: Secondary | ICD-10-CM | POA: Diagnosis not present

## 2018-09-22 DIAGNOSIS — R569 Unspecified convulsions: Secondary | ICD-10-CM | POA: Diagnosis not present

## 2018-09-22 DIAGNOSIS — M79662 Pain in left lower leg: Secondary | ICD-10-CM | POA: Diagnosis not present

## 2018-09-24 DIAGNOSIS — J9690 Respiratory failure, unspecified, unspecified whether with hypoxia or hypercapnia: Secondary | ICD-10-CM | POA: Diagnosis not present

## 2018-09-24 DIAGNOSIS — R2681 Unsteadiness on feet: Secondary | ICD-10-CM | POA: Diagnosis not present

## 2018-09-24 DIAGNOSIS — R278 Other lack of coordination: Secondary | ICD-10-CM | POA: Diagnosis not present

## 2018-09-24 DIAGNOSIS — M79661 Pain in right lower leg: Secondary | ICD-10-CM | POA: Diagnosis not present

## 2018-09-24 DIAGNOSIS — R1312 Dysphagia, oropharyngeal phase: Secondary | ICD-10-CM | POA: Diagnosis not present

## 2018-09-24 DIAGNOSIS — R1319 Other dysphagia: Secondary | ICD-10-CM | POA: Diagnosis not present

## 2018-09-24 DIAGNOSIS — U071 COVID-19: Secondary | ICD-10-CM | POA: Diagnosis not present

## 2018-09-24 DIAGNOSIS — M79662 Pain in left lower leg: Secondary | ICD-10-CM | POA: Diagnosis not present

## 2018-09-26 DIAGNOSIS — M79662 Pain in left lower leg: Secondary | ICD-10-CM | POA: Diagnosis not present

## 2018-09-26 DIAGNOSIS — J9690 Respiratory failure, unspecified, unspecified whether with hypoxia or hypercapnia: Secondary | ICD-10-CM | POA: Diagnosis not present

## 2018-09-26 DIAGNOSIS — R278 Other lack of coordination: Secondary | ICD-10-CM | POA: Diagnosis not present

## 2018-09-26 DIAGNOSIS — R1319 Other dysphagia: Secondary | ICD-10-CM | POA: Diagnosis not present

## 2018-09-26 DIAGNOSIS — R1312 Dysphagia, oropharyngeal phase: Secondary | ICD-10-CM | POA: Diagnosis not present

## 2018-09-26 DIAGNOSIS — U071 COVID-19: Secondary | ICD-10-CM | POA: Diagnosis not present

## 2018-09-26 DIAGNOSIS — M79661 Pain in right lower leg: Secondary | ICD-10-CM | POA: Diagnosis not present

## 2018-09-26 DIAGNOSIS — R2681 Unsteadiness on feet: Secondary | ICD-10-CM | POA: Diagnosis not present

## 2018-09-28 DIAGNOSIS — U071 COVID-19: Secondary | ICD-10-CM | POA: Diagnosis not present

## 2018-09-28 DIAGNOSIS — M79662 Pain in left lower leg: Secondary | ICD-10-CM | POA: Diagnosis not present

## 2018-09-28 DIAGNOSIS — R1319 Other dysphagia: Secondary | ICD-10-CM | POA: Diagnosis not present

## 2018-09-28 DIAGNOSIS — J9690 Respiratory failure, unspecified, unspecified whether with hypoxia or hypercapnia: Secondary | ICD-10-CM | POA: Diagnosis not present

## 2018-09-28 DIAGNOSIS — M79661 Pain in right lower leg: Secondary | ICD-10-CM | POA: Diagnosis not present

## 2018-09-28 DIAGNOSIS — R1312 Dysphagia, oropharyngeal phase: Secondary | ICD-10-CM | POA: Diagnosis not present

## 2018-09-28 DIAGNOSIS — R2681 Unsteadiness on feet: Secondary | ICD-10-CM | POA: Diagnosis not present

## 2018-09-28 DIAGNOSIS — R278 Other lack of coordination: Secondary | ICD-10-CM | POA: Diagnosis not present

## 2018-09-29 DIAGNOSIS — R1319 Other dysphagia: Secondary | ICD-10-CM | POA: Diagnosis not present

## 2018-09-29 DIAGNOSIS — R278 Other lack of coordination: Secondary | ICD-10-CM | POA: Diagnosis not present

## 2018-09-29 DIAGNOSIS — J9690 Respiratory failure, unspecified, unspecified whether with hypoxia or hypercapnia: Secondary | ICD-10-CM | POA: Diagnosis not present

## 2018-09-29 DIAGNOSIS — R1312 Dysphagia, oropharyngeal phase: Secondary | ICD-10-CM | POA: Diagnosis not present

## 2018-09-29 DIAGNOSIS — U071 COVID-19: Secondary | ICD-10-CM | POA: Diagnosis not present

## 2018-09-29 DIAGNOSIS — M79661 Pain in right lower leg: Secondary | ICD-10-CM | POA: Diagnosis not present

## 2018-09-29 DIAGNOSIS — R2681 Unsteadiness on feet: Secondary | ICD-10-CM | POA: Diagnosis not present

## 2018-09-29 DIAGNOSIS — M79662 Pain in left lower leg: Secondary | ICD-10-CM | POA: Diagnosis not present

## 2018-09-30 DIAGNOSIS — M79662 Pain in left lower leg: Secondary | ICD-10-CM | POA: Diagnosis not present

## 2018-09-30 DIAGNOSIS — I10 Essential (primary) hypertension: Secondary | ICD-10-CM | POA: Diagnosis not present

## 2018-09-30 DIAGNOSIS — R2681 Unsteadiness on feet: Secondary | ICD-10-CM | POA: Diagnosis not present

## 2018-09-30 DIAGNOSIS — R1312 Dysphagia, oropharyngeal phase: Secondary | ICD-10-CM | POA: Diagnosis not present

## 2018-09-30 DIAGNOSIS — R278 Other lack of coordination: Secondary | ICD-10-CM | POA: Diagnosis not present

## 2018-09-30 DIAGNOSIS — E568 Deficiency of other vitamins: Secondary | ICD-10-CM | POA: Diagnosis not present

## 2018-09-30 DIAGNOSIS — R1319 Other dysphagia: Secondary | ICD-10-CM | POA: Diagnosis not present

## 2018-09-30 DIAGNOSIS — M79661 Pain in right lower leg: Secondary | ICD-10-CM | POA: Diagnosis not present

## 2018-09-30 DIAGNOSIS — U071 COVID-19: Secondary | ICD-10-CM | POA: Diagnosis not present

## 2018-09-30 DIAGNOSIS — J9690 Respiratory failure, unspecified, unspecified whether with hypoxia or hypercapnia: Secondary | ICD-10-CM | POA: Diagnosis not present

## 2018-09-30 DIAGNOSIS — J984 Other disorders of lung: Secondary | ICD-10-CM | POA: Diagnosis not present

## 2018-10-01 DIAGNOSIS — R1319 Other dysphagia: Secondary | ICD-10-CM | POA: Diagnosis not present

## 2018-10-01 DIAGNOSIS — M79661 Pain in right lower leg: Secondary | ICD-10-CM | POA: Diagnosis not present

## 2018-10-01 DIAGNOSIS — R2681 Unsteadiness on feet: Secondary | ICD-10-CM | POA: Diagnosis not present

## 2018-10-01 DIAGNOSIS — U071 COVID-19: Secondary | ICD-10-CM | POA: Diagnosis not present

## 2018-10-01 DIAGNOSIS — J9690 Respiratory failure, unspecified, unspecified whether with hypoxia or hypercapnia: Secondary | ICD-10-CM | POA: Diagnosis not present

## 2018-10-01 DIAGNOSIS — R1312 Dysphagia, oropharyngeal phase: Secondary | ICD-10-CM | POA: Diagnosis not present

## 2018-10-01 DIAGNOSIS — R278 Other lack of coordination: Secondary | ICD-10-CM | POA: Diagnosis not present

## 2018-10-01 DIAGNOSIS — M79662 Pain in left lower leg: Secondary | ICD-10-CM | POA: Diagnosis not present

## 2018-10-02 DIAGNOSIS — J9690 Respiratory failure, unspecified, unspecified whether with hypoxia or hypercapnia: Secondary | ICD-10-CM | POA: Diagnosis not present

## 2018-10-02 DIAGNOSIS — M79662 Pain in left lower leg: Secondary | ICD-10-CM | POA: Diagnosis not present

## 2018-10-02 DIAGNOSIS — M79661 Pain in right lower leg: Secondary | ICD-10-CM | POA: Diagnosis not present

## 2018-10-02 DIAGNOSIS — R1319 Other dysphagia: Secondary | ICD-10-CM | POA: Diagnosis not present

## 2018-10-02 DIAGNOSIS — U071 COVID-19: Secondary | ICD-10-CM | POA: Diagnosis not present

## 2018-10-02 DIAGNOSIS — R1312 Dysphagia, oropharyngeal phase: Secondary | ICD-10-CM | POA: Diagnosis not present

## 2018-10-02 DIAGNOSIS — R2681 Unsteadiness on feet: Secondary | ICD-10-CM | POA: Diagnosis not present

## 2018-10-02 DIAGNOSIS — R278 Other lack of coordination: Secondary | ICD-10-CM | POA: Diagnosis not present

## 2018-10-03 DIAGNOSIS — J9690 Respiratory failure, unspecified, unspecified whether with hypoxia or hypercapnia: Secondary | ICD-10-CM | POA: Diagnosis not present

## 2018-10-03 DIAGNOSIS — R1312 Dysphagia, oropharyngeal phase: Secondary | ICD-10-CM | POA: Diagnosis not present

## 2018-10-03 DIAGNOSIS — M79661 Pain in right lower leg: Secondary | ICD-10-CM | POA: Diagnosis not present

## 2018-10-03 DIAGNOSIS — R2681 Unsteadiness on feet: Secondary | ICD-10-CM | POA: Diagnosis not present

## 2018-10-03 DIAGNOSIS — R1319 Other dysphagia: Secondary | ICD-10-CM | POA: Diagnosis not present

## 2018-10-03 DIAGNOSIS — M79662 Pain in left lower leg: Secondary | ICD-10-CM | POA: Diagnosis not present

## 2018-10-03 DIAGNOSIS — R278 Other lack of coordination: Secondary | ICD-10-CM | POA: Diagnosis not present

## 2018-10-03 DIAGNOSIS — U071 COVID-19: Secondary | ICD-10-CM | POA: Diagnosis not present

## 2018-10-06 DIAGNOSIS — J9690 Respiratory failure, unspecified, unspecified whether with hypoxia or hypercapnia: Secondary | ICD-10-CM | POA: Diagnosis not present

## 2018-10-06 DIAGNOSIS — R1319 Other dysphagia: Secondary | ICD-10-CM | POA: Diagnosis not present

## 2018-10-06 DIAGNOSIS — R278 Other lack of coordination: Secondary | ICD-10-CM | POA: Diagnosis not present

## 2018-10-06 DIAGNOSIS — R1312 Dysphagia, oropharyngeal phase: Secondary | ICD-10-CM | POA: Diagnosis not present

## 2018-10-06 DIAGNOSIS — R2681 Unsteadiness on feet: Secondary | ICD-10-CM | POA: Diagnosis not present

## 2018-10-06 DIAGNOSIS — M79661 Pain in right lower leg: Secondary | ICD-10-CM | POA: Diagnosis not present

## 2018-10-06 DIAGNOSIS — U071 COVID-19: Secondary | ICD-10-CM | POA: Diagnosis not present

## 2018-10-06 DIAGNOSIS — M79662 Pain in left lower leg: Secondary | ICD-10-CM | POA: Diagnosis not present

## 2018-10-07 DIAGNOSIS — R569 Unspecified convulsions: Secondary | ICD-10-CM | POA: Diagnosis not present

## 2018-10-07 DIAGNOSIS — U071 COVID-19: Secondary | ICD-10-CM | POA: Diagnosis not present

## 2018-10-07 DIAGNOSIS — M79661 Pain in right lower leg: Secondary | ICD-10-CM | POA: Diagnosis not present

## 2018-10-07 DIAGNOSIS — M79662 Pain in left lower leg: Secondary | ICD-10-CM | POA: Diagnosis not present

## 2018-10-07 DIAGNOSIS — R278 Other lack of coordination: Secondary | ICD-10-CM | POA: Diagnosis not present

## 2018-10-07 DIAGNOSIS — I69959 Hemiplegia and hemiparesis following unspecified cerebrovascular disease affecting unspecified side: Secondary | ICD-10-CM | POA: Diagnosis not present

## 2018-10-07 DIAGNOSIS — R2681 Unsteadiness on feet: Secondary | ICD-10-CM | POA: Diagnosis not present

## 2018-10-07 DIAGNOSIS — R1312 Dysphagia, oropharyngeal phase: Secondary | ICD-10-CM | POA: Diagnosis not present

## 2018-10-07 DIAGNOSIS — R1319 Other dysphagia: Secondary | ICD-10-CM | POA: Diagnosis not present

## 2018-10-07 DIAGNOSIS — J9690 Respiratory failure, unspecified, unspecified whether with hypoxia or hypercapnia: Secondary | ICD-10-CM | POA: Diagnosis not present

## 2018-10-08 DIAGNOSIS — M79661 Pain in right lower leg: Secondary | ICD-10-CM | POA: Diagnosis not present

## 2018-10-08 DIAGNOSIS — R278 Other lack of coordination: Secondary | ICD-10-CM | POA: Diagnosis not present

## 2018-10-08 DIAGNOSIS — J9690 Respiratory failure, unspecified, unspecified whether with hypoxia or hypercapnia: Secondary | ICD-10-CM | POA: Diagnosis not present

## 2018-10-08 DIAGNOSIS — R2681 Unsteadiness on feet: Secondary | ICD-10-CM | POA: Diagnosis not present

## 2018-10-08 DIAGNOSIS — R1319 Other dysphagia: Secondary | ICD-10-CM | POA: Diagnosis not present

## 2018-10-08 DIAGNOSIS — R1312 Dysphagia, oropharyngeal phase: Secondary | ICD-10-CM | POA: Diagnosis not present

## 2018-10-08 DIAGNOSIS — U071 COVID-19: Secondary | ICD-10-CM | POA: Diagnosis not present

## 2018-10-08 DIAGNOSIS — M79662 Pain in left lower leg: Secondary | ICD-10-CM | POA: Diagnosis not present

## 2018-10-10 DIAGNOSIS — M79661 Pain in right lower leg: Secondary | ICD-10-CM | POA: Diagnosis not present

## 2018-10-10 DIAGNOSIS — M79662 Pain in left lower leg: Secondary | ICD-10-CM | POA: Diagnosis not present

## 2018-10-10 DIAGNOSIS — R1312 Dysphagia, oropharyngeal phase: Secondary | ICD-10-CM | POA: Diagnosis not present

## 2018-10-10 DIAGNOSIS — J9690 Respiratory failure, unspecified, unspecified whether with hypoxia or hypercapnia: Secondary | ICD-10-CM | POA: Diagnosis not present

## 2018-10-10 DIAGNOSIS — Z9181 History of falling: Secondary | ICD-10-CM | POA: Diagnosis not present

## 2018-10-10 DIAGNOSIS — R278 Other lack of coordination: Secondary | ICD-10-CM | POA: Diagnosis not present

## 2018-10-10 DIAGNOSIS — R1319 Other dysphagia: Secondary | ICD-10-CM | POA: Diagnosis not present

## 2018-10-10 DIAGNOSIS — R2681 Unsteadiness on feet: Secondary | ICD-10-CM | POA: Diagnosis not present

## 2018-10-11 DIAGNOSIS — R278 Other lack of coordination: Secondary | ICD-10-CM | POA: Diagnosis not present

## 2018-10-11 DIAGNOSIS — R2681 Unsteadiness on feet: Secondary | ICD-10-CM | POA: Diagnosis not present

## 2018-10-11 DIAGNOSIS — M79662 Pain in left lower leg: Secondary | ICD-10-CM | POA: Diagnosis not present

## 2018-10-11 DIAGNOSIS — Z9181 History of falling: Secondary | ICD-10-CM | POA: Diagnosis not present

## 2018-10-11 DIAGNOSIS — M79661 Pain in right lower leg: Secondary | ICD-10-CM | POA: Diagnosis not present

## 2018-10-11 DIAGNOSIS — R1319 Other dysphagia: Secondary | ICD-10-CM | POA: Diagnosis not present

## 2018-10-11 DIAGNOSIS — R1312 Dysphagia, oropharyngeal phase: Secondary | ICD-10-CM | POA: Diagnosis not present

## 2018-10-11 DIAGNOSIS — J9690 Respiratory failure, unspecified, unspecified whether with hypoxia or hypercapnia: Secondary | ICD-10-CM | POA: Diagnosis not present

## 2018-10-13 DIAGNOSIS — J9690 Respiratory failure, unspecified, unspecified whether with hypoxia or hypercapnia: Secondary | ICD-10-CM | POA: Diagnosis not present

## 2018-10-13 DIAGNOSIS — M79661 Pain in right lower leg: Secondary | ICD-10-CM | POA: Diagnosis not present

## 2018-10-13 DIAGNOSIS — R2681 Unsteadiness on feet: Secondary | ICD-10-CM | POA: Diagnosis not present

## 2018-10-13 DIAGNOSIS — M79662 Pain in left lower leg: Secondary | ICD-10-CM | POA: Diagnosis not present

## 2018-10-13 DIAGNOSIS — Z9181 History of falling: Secondary | ICD-10-CM | POA: Diagnosis not present

## 2018-10-13 DIAGNOSIS — R278 Other lack of coordination: Secondary | ICD-10-CM | POA: Diagnosis not present

## 2018-10-13 DIAGNOSIS — R1312 Dysphagia, oropharyngeal phase: Secondary | ICD-10-CM | POA: Diagnosis not present

## 2018-10-13 DIAGNOSIS — R1319 Other dysphagia: Secondary | ICD-10-CM | POA: Diagnosis not present

## 2018-10-14 DIAGNOSIS — Z9181 History of falling: Secondary | ICD-10-CM | POA: Diagnosis not present

## 2018-10-14 DIAGNOSIS — J9690 Respiratory failure, unspecified, unspecified whether with hypoxia or hypercapnia: Secondary | ICD-10-CM | POA: Diagnosis not present

## 2018-10-14 DIAGNOSIS — R569 Unspecified convulsions: Secondary | ICD-10-CM | POA: Diagnosis not present

## 2018-10-14 DIAGNOSIS — R278 Other lack of coordination: Secondary | ICD-10-CM | POA: Diagnosis not present

## 2018-10-14 DIAGNOSIS — I69959 Hemiplegia and hemiparesis following unspecified cerebrovascular disease affecting unspecified side: Secondary | ICD-10-CM | POA: Diagnosis not present

## 2018-10-14 DIAGNOSIS — M79662 Pain in left lower leg: Secondary | ICD-10-CM | POA: Diagnosis not present

## 2018-10-14 DIAGNOSIS — R1312 Dysphagia, oropharyngeal phase: Secondary | ICD-10-CM | POA: Diagnosis not present

## 2018-10-14 DIAGNOSIS — M79661 Pain in right lower leg: Secondary | ICD-10-CM | POA: Diagnosis not present

## 2018-10-14 DIAGNOSIS — R2681 Unsteadiness on feet: Secondary | ICD-10-CM | POA: Diagnosis not present

## 2018-10-14 DIAGNOSIS — R1319 Other dysphagia: Secondary | ICD-10-CM | POA: Diagnosis not present

## 2018-10-21 DIAGNOSIS — U071 COVID-19: Secondary | ICD-10-CM | POA: Diagnosis not present

## 2018-10-28 DIAGNOSIS — R569 Unspecified convulsions: Secondary | ICD-10-CM | POA: Diagnosis not present

## 2018-10-28 DIAGNOSIS — I69959 Hemiplegia and hemiparesis following unspecified cerebrovascular disease affecting unspecified side: Secondary | ICD-10-CM | POA: Diagnosis not present

## 2018-10-29 DIAGNOSIS — R278 Other lack of coordination: Secondary | ICD-10-CM | POA: Diagnosis not present

## 2018-10-29 DIAGNOSIS — R1319 Other dysphagia: Secondary | ICD-10-CM | POA: Diagnosis not present

## 2018-10-29 DIAGNOSIS — M79661 Pain in right lower leg: Secondary | ICD-10-CM | POA: Diagnosis not present

## 2018-10-29 DIAGNOSIS — J9690 Respiratory failure, unspecified, unspecified whether with hypoxia or hypercapnia: Secondary | ICD-10-CM | POA: Diagnosis not present

## 2018-10-29 DIAGNOSIS — Z9181 History of falling: Secondary | ICD-10-CM | POA: Diagnosis not present

## 2018-10-29 DIAGNOSIS — R2681 Unsteadiness on feet: Secondary | ICD-10-CM | POA: Diagnosis not present

## 2018-10-29 DIAGNOSIS — M79662 Pain in left lower leg: Secondary | ICD-10-CM | POA: Diagnosis not present

## 2018-10-29 DIAGNOSIS — U071 COVID-19: Secondary | ICD-10-CM | POA: Diagnosis not present

## 2018-10-29 DIAGNOSIS — R1312 Dysphagia, oropharyngeal phase: Secondary | ICD-10-CM | POA: Diagnosis not present

## 2018-11-03 DIAGNOSIS — R2681 Unsteadiness on feet: Secondary | ICD-10-CM | POA: Diagnosis not present

## 2018-11-03 DIAGNOSIS — R278 Other lack of coordination: Secondary | ICD-10-CM | POA: Diagnosis not present

## 2018-11-03 DIAGNOSIS — M79661 Pain in right lower leg: Secondary | ICD-10-CM | POA: Diagnosis not present

## 2018-11-03 DIAGNOSIS — R1319 Other dysphagia: Secondary | ICD-10-CM | POA: Diagnosis not present

## 2018-11-03 DIAGNOSIS — R1312 Dysphagia, oropharyngeal phase: Secondary | ICD-10-CM | POA: Diagnosis not present

## 2018-11-03 DIAGNOSIS — J9690 Respiratory failure, unspecified, unspecified whether with hypoxia or hypercapnia: Secondary | ICD-10-CM | POA: Diagnosis not present

## 2018-11-03 DIAGNOSIS — Z9181 History of falling: Secondary | ICD-10-CM | POA: Diagnosis not present

## 2018-11-03 DIAGNOSIS — M79662 Pain in left lower leg: Secondary | ICD-10-CM | POA: Diagnosis not present

## 2018-11-04 DIAGNOSIS — R2681 Unsteadiness on feet: Secondary | ICD-10-CM | POA: Diagnosis not present

## 2018-11-04 DIAGNOSIS — M79661 Pain in right lower leg: Secondary | ICD-10-CM | POA: Diagnosis not present

## 2018-11-04 DIAGNOSIS — Z9181 History of falling: Secondary | ICD-10-CM | POA: Diagnosis not present

## 2018-11-04 DIAGNOSIS — J9690 Respiratory failure, unspecified, unspecified whether with hypoxia or hypercapnia: Secondary | ICD-10-CM | POA: Diagnosis not present

## 2018-11-04 DIAGNOSIS — M79662 Pain in left lower leg: Secondary | ICD-10-CM | POA: Diagnosis not present

## 2018-11-04 DIAGNOSIS — U071 COVID-19: Secondary | ICD-10-CM | POA: Diagnosis not present

## 2018-11-04 DIAGNOSIS — H1089 Other conjunctivitis: Secondary | ICD-10-CM | POA: Diagnosis not present

## 2018-11-04 DIAGNOSIS — R1312 Dysphagia, oropharyngeal phase: Secondary | ICD-10-CM | POA: Diagnosis not present

## 2018-11-04 DIAGNOSIS — R1319 Other dysphagia: Secondary | ICD-10-CM | POA: Diagnosis not present

## 2018-11-04 DIAGNOSIS — R278 Other lack of coordination: Secondary | ICD-10-CM | POA: Diagnosis not present

## 2018-11-05 DIAGNOSIS — R1319 Other dysphagia: Secondary | ICD-10-CM | POA: Diagnosis not present

## 2018-11-05 DIAGNOSIS — M79661 Pain in right lower leg: Secondary | ICD-10-CM | POA: Diagnosis not present

## 2018-11-05 DIAGNOSIS — R1312 Dysphagia, oropharyngeal phase: Secondary | ICD-10-CM | POA: Diagnosis not present

## 2018-11-05 DIAGNOSIS — R2681 Unsteadiness on feet: Secondary | ICD-10-CM | POA: Diagnosis not present

## 2018-11-05 DIAGNOSIS — M79662 Pain in left lower leg: Secondary | ICD-10-CM | POA: Diagnosis not present

## 2018-11-05 DIAGNOSIS — Z9181 History of falling: Secondary | ICD-10-CM | POA: Diagnosis not present

## 2018-11-05 DIAGNOSIS — R278 Other lack of coordination: Secondary | ICD-10-CM | POA: Diagnosis not present

## 2018-11-05 DIAGNOSIS — J9690 Respiratory failure, unspecified, unspecified whether with hypoxia or hypercapnia: Secondary | ICD-10-CM | POA: Diagnosis not present

## 2018-11-10 DIAGNOSIS — M79662 Pain in left lower leg: Secondary | ICD-10-CM | POA: Diagnosis not present

## 2018-11-10 DIAGNOSIS — J9690 Respiratory failure, unspecified, unspecified whether with hypoxia or hypercapnia: Secondary | ICD-10-CM | POA: Diagnosis not present

## 2018-11-10 DIAGNOSIS — R1312 Dysphagia, oropharyngeal phase: Secondary | ICD-10-CM | POA: Diagnosis not present

## 2018-11-10 DIAGNOSIS — R2681 Unsteadiness on feet: Secondary | ICD-10-CM | POA: Diagnosis not present

## 2018-11-10 DIAGNOSIS — R278 Other lack of coordination: Secondary | ICD-10-CM | POA: Diagnosis not present

## 2018-11-10 DIAGNOSIS — M79661 Pain in right lower leg: Secondary | ICD-10-CM | POA: Diagnosis not present

## 2018-11-10 DIAGNOSIS — R1319 Other dysphagia: Secondary | ICD-10-CM | POA: Diagnosis not present

## 2018-11-10 DIAGNOSIS — Z9181 History of falling: Secondary | ICD-10-CM | POA: Diagnosis not present

## 2018-11-11 DIAGNOSIS — M79662 Pain in left lower leg: Secondary | ICD-10-CM | POA: Diagnosis not present

## 2018-11-11 DIAGNOSIS — Z9181 History of falling: Secondary | ICD-10-CM | POA: Diagnosis not present

## 2018-11-11 DIAGNOSIS — R278 Other lack of coordination: Secondary | ICD-10-CM | POA: Diagnosis not present

## 2018-11-11 DIAGNOSIS — U071 COVID-19: Secondary | ICD-10-CM | POA: Diagnosis not present

## 2018-11-11 DIAGNOSIS — R1319 Other dysphagia: Secondary | ICD-10-CM | POA: Diagnosis not present

## 2018-11-11 DIAGNOSIS — J9690 Respiratory failure, unspecified, unspecified whether with hypoxia or hypercapnia: Secondary | ICD-10-CM | POA: Diagnosis not present

## 2018-11-11 DIAGNOSIS — R1312 Dysphagia, oropharyngeal phase: Secondary | ICD-10-CM | POA: Diagnosis not present

## 2018-11-11 DIAGNOSIS — M79661 Pain in right lower leg: Secondary | ICD-10-CM | POA: Diagnosis not present

## 2018-11-11 DIAGNOSIS — R2681 Unsteadiness on feet: Secondary | ICD-10-CM | POA: Diagnosis not present

## 2018-11-12 DIAGNOSIS — J9690 Respiratory failure, unspecified, unspecified whether with hypoxia or hypercapnia: Secondary | ICD-10-CM | POA: Diagnosis not present

## 2018-11-12 DIAGNOSIS — R1312 Dysphagia, oropharyngeal phase: Secondary | ICD-10-CM | POA: Diagnosis not present

## 2018-11-12 DIAGNOSIS — M79661 Pain in right lower leg: Secondary | ICD-10-CM | POA: Diagnosis not present

## 2018-11-12 DIAGNOSIS — R1319 Other dysphagia: Secondary | ICD-10-CM | POA: Diagnosis not present

## 2018-11-12 DIAGNOSIS — R2681 Unsteadiness on feet: Secondary | ICD-10-CM | POA: Diagnosis not present

## 2018-11-12 DIAGNOSIS — Z9181 History of falling: Secondary | ICD-10-CM | POA: Diagnosis not present

## 2018-11-12 DIAGNOSIS — M79662 Pain in left lower leg: Secondary | ICD-10-CM | POA: Diagnosis not present

## 2018-11-12 DIAGNOSIS — R278 Other lack of coordination: Secondary | ICD-10-CM | POA: Diagnosis not present

## 2018-11-13 DIAGNOSIS — R1319 Other dysphagia: Secondary | ICD-10-CM | POA: Diagnosis not present

## 2018-11-13 DIAGNOSIS — Z9181 History of falling: Secondary | ICD-10-CM | POA: Diagnosis not present

## 2018-11-13 DIAGNOSIS — I10 Essential (primary) hypertension: Secondary | ICD-10-CM | POA: Diagnosis not present

## 2018-11-13 DIAGNOSIS — M79661 Pain in right lower leg: Secondary | ICD-10-CM | POA: Diagnosis not present

## 2018-11-13 DIAGNOSIS — R278 Other lack of coordination: Secondary | ICD-10-CM | POA: Diagnosis not present

## 2018-11-13 DIAGNOSIS — J9690 Respiratory failure, unspecified, unspecified whether with hypoxia or hypercapnia: Secondary | ICD-10-CM | POA: Diagnosis not present

## 2018-11-13 DIAGNOSIS — R1312 Dysphagia, oropharyngeal phase: Secondary | ICD-10-CM | POA: Diagnosis not present

## 2018-11-13 DIAGNOSIS — M79662 Pain in left lower leg: Secondary | ICD-10-CM | POA: Diagnosis not present

## 2018-11-13 DIAGNOSIS — R2681 Unsteadiness on feet: Secondary | ICD-10-CM | POA: Diagnosis not present

## 2018-11-13 DIAGNOSIS — H1089 Other conjunctivitis: Secondary | ICD-10-CM | POA: Diagnosis not present

## 2018-11-14 DIAGNOSIS — M79662 Pain in left lower leg: Secondary | ICD-10-CM | POA: Diagnosis not present

## 2018-11-14 DIAGNOSIS — J9690 Respiratory failure, unspecified, unspecified whether with hypoxia or hypercapnia: Secondary | ICD-10-CM | POA: Diagnosis not present

## 2018-11-14 DIAGNOSIS — R2681 Unsteadiness on feet: Secondary | ICD-10-CM | POA: Diagnosis not present

## 2018-11-14 DIAGNOSIS — R1312 Dysphagia, oropharyngeal phase: Secondary | ICD-10-CM | POA: Diagnosis not present

## 2018-11-14 DIAGNOSIS — R278 Other lack of coordination: Secondary | ICD-10-CM | POA: Diagnosis not present

## 2018-11-14 DIAGNOSIS — M79661 Pain in right lower leg: Secondary | ICD-10-CM | POA: Diagnosis not present

## 2018-11-14 DIAGNOSIS — R1319 Other dysphagia: Secondary | ICD-10-CM | POA: Diagnosis not present

## 2018-11-14 DIAGNOSIS — Z9181 History of falling: Secondary | ICD-10-CM | POA: Diagnosis not present

## 2018-11-19 DIAGNOSIS — M79662 Pain in left lower leg: Secondary | ICD-10-CM | POA: Diagnosis not present

## 2018-11-19 DIAGNOSIS — M79661 Pain in right lower leg: Secondary | ICD-10-CM | POA: Diagnosis not present

## 2018-11-19 DIAGNOSIS — R1319 Other dysphagia: Secondary | ICD-10-CM | POA: Diagnosis not present

## 2018-11-19 DIAGNOSIS — J9690 Respiratory failure, unspecified, unspecified whether with hypoxia or hypercapnia: Secondary | ICD-10-CM | POA: Diagnosis not present

## 2018-11-19 DIAGNOSIS — R1312 Dysphagia, oropharyngeal phase: Secondary | ICD-10-CM | POA: Diagnosis not present

## 2018-11-19 DIAGNOSIS — R2681 Unsteadiness on feet: Secondary | ICD-10-CM | POA: Diagnosis not present

## 2018-11-19 DIAGNOSIS — R278 Other lack of coordination: Secondary | ICD-10-CM | POA: Diagnosis not present

## 2018-11-19 DIAGNOSIS — Z9181 History of falling: Secondary | ICD-10-CM | POA: Diagnosis not present

## 2018-11-20 DIAGNOSIS — Z5181 Encounter for therapeutic drug level monitoring: Secondary | ICD-10-CM | POA: Diagnosis not present

## 2018-11-21 DIAGNOSIS — M79661 Pain in right lower leg: Secondary | ICD-10-CM | POA: Diagnosis not present

## 2018-11-21 DIAGNOSIS — M79662 Pain in left lower leg: Secondary | ICD-10-CM | POA: Diagnosis not present

## 2018-11-21 DIAGNOSIS — R2681 Unsteadiness on feet: Secondary | ICD-10-CM | POA: Diagnosis not present

## 2018-11-21 DIAGNOSIS — R1312 Dysphagia, oropharyngeal phase: Secondary | ICD-10-CM | POA: Diagnosis not present

## 2018-11-21 DIAGNOSIS — R278 Other lack of coordination: Secondary | ICD-10-CM | POA: Diagnosis not present

## 2018-11-21 DIAGNOSIS — J9690 Respiratory failure, unspecified, unspecified whether with hypoxia or hypercapnia: Secondary | ICD-10-CM | POA: Diagnosis not present

## 2018-11-21 DIAGNOSIS — Z9181 History of falling: Secondary | ICD-10-CM | POA: Diagnosis not present

## 2018-11-21 DIAGNOSIS — R1319 Other dysphagia: Secondary | ICD-10-CM | POA: Diagnosis not present

## 2018-11-22 DIAGNOSIS — R278 Other lack of coordination: Secondary | ICD-10-CM | POA: Diagnosis not present

## 2018-11-22 DIAGNOSIS — M79661 Pain in right lower leg: Secondary | ICD-10-CM | POA: Diagnosis not present

## 2018-11-22 DIAGNOSIS — J9690 Respiratory failure, unspecified, unspecified whether with hypoxia or hypercapnia: Secondary | ICD-10-CM | POA: Diagnosis not present

## 2018-11-22 DIAGNOSIS — R2681 Unsteadiness on feet: Secondary | ICD-10-CM | POA: Diagnosis not present

## 2018-11-22 DIAGNOSIS — M79662 Pain in left lower leg: Secondary | ICD-10-CM | POA: Diagnosis not present

## 2018-11-22 DIAGNOSIS — R1319 Other dysphagia: Secondary | ICD-10-CM | POA: Diagnosis not present

## 2018-11-22 DIAGNOSIS — Z9181 History of falling: Secondary | ICD-10-CM | POA: Diagnosis not present

## 2018-11-22 DIAGNOSIS — R1312 Dysphagia, oropharyngeal phase: Secondary | ICD-10-CM | POA: Diagnosis not present

## 2018-11-24 DIAGNOSIS — U071 COVID-19: Secondary | ICD-10-CM | POA: Diagnosis not present

## 2018-11-25 DIAGNOSIS — I69959 Hemiplegia and hemiparesis following unspecified cerebrovascular disease affecting unspecified side: Secondary | ICD-10-CM | POA: Diagnosis not present

## 2018-11-25 DIAGNOSIS — R569 Unspecified convulsions: Secondary | ICD-10-CM | POA: Diagnosis not present

## 2018-11-26 DIAGNOSIS — Z9181 History of falling: Secondary | ICD-10-CM | POA: Diagnosis not present

## 2018-11-26 DIAGNOSIS — R1319 Other dysphagia: Secondary | ICD-10-CM | POA: Diagnosis not present

## 2018-11-26 DIAGNOSIS — M79661 Pain in right lower leg: Secondary | ICD-10-CM | POA: Diagnosis not present

## 2018-11-26 DIAGNOSIS — R278 Other lack of coordination: Secondary | ICD-10-CM | POA: Diagnosis not present

## 2018-11-26 DIAGNOSIS — R1312 Dysphagia, oropharyngeal phase: Secondary | ICD-10-CM | POA: Diagnosis not present

## 2018-11-26 DIAGNOSIS — R2681 Unsteadiness on feet: Secondary | ICD-10-CM | POA: Diagnosis not present

## 2018-11-26 DIAGNOSIS — M79662 Pain in left lower leg: Secondary | ICD-10-CM | POA: Diagnosis not present

## 2018-11-26 DIAGNOSIS — J9690 Respiratory failure, unspecified, unspecified whether with hypoxia or hypercapnia: Secondary | ICD-10-CM | POA: Diagnosis not present

## 2018-11-27 DIAGNOSIS — J9690 Respiratory failure, unspecified, unspecified whether with hypoxia or hypercapnia: Secondary | ICD-10-CM | POA: Diagnosis not present

## 2018-11-27 DIAGNOSIS — R1312 Dysphagia, oropharyngeal phase: Secondary | ICD-10-CM | POA: Diagnosis not present

## 2018-11-27 DIAGNOSIS — I639 Cerebral infarction, unspecified: Secondary | ICD-10-CM | POA: Diagnosis not present

## 2018-11-27 DIAGNOSIS — M79662 Pain in left lower leg: Secondary | ICD-10-CM | POA: Diagnosis not present

## 2018-11-27 DIAGNOSIS — R1319 Other dysphagia: Secondary | ICD-10-CM | POA: Diagnosis not present

## 2018-11-27 DIAGNOSIS — R278 Other lack of coordination: Secondary | ICD-10-CM | POA: Diagnosis not present

## 2018-11-27 DIAGNOSIS — R2681 Unsteadiness on feet: Secondary | ICD-10-CM | POA: Diagnosis not present

## 2018-11-27 DIAGNOSIS — M79661 Pain in right lower leg: Secondary | ICD-10-CM | POA: Diagnosis not present

## 2018-11-27 DIAGNOSIS — Z9181 History of falling: Secondary | ICD-10-CM | POA: Diagnosis not present

## 2018-11-28 DIAGNOSIS — R0989 Other specified symptoms and signs involving the circulatory and respiratory systems: Secondary | ICD-10-CM | POA: Diagnosis not present

## 2018-11-28 DIAGNOSIS — R2681 Unsteadiness on feet: Secondary | ICD-10-CM | POA: Diagnosis not present

## 2018-11-28 DIAGNOSIS — J9690 Respiratory failure, unspecified, unspecified whether with hypoxia or hypercapnia: Secondary | ICD-10-CM | POA: Diagnosis not present

## 2018-11-28 DIAGNOSIS — R1312 Dysphagia, oropharyngeal phase: Secondary | ICD-10-CM | POA: Diagnosis not present

## 2018-11-28 DIAGNOSIS — Z0189 Encounter for other specified special examinations: Secondary | ICD-10-CM | POA: Diagnosis not present

## 2018-11-28 DIAGNOSIS — J028 Acute pharyngitis due to other specified organisms: Secondary | ICD-10-CM | POA: Diagnosis not present

## 2018-11-28 DIAGNOSIS — Z9181 History of falling: Secondary | ICD-10-CM | POA: Diagnosis not present

## 2018-11-28 DIAGNOSIS — R1319 Other dysphagia: Secondary | ICD-10-CM | POA: Diagnosis not present

## 2018-11-28 DIAGNOSIS — M79662 Pain in left lower leg: Secondary | ICD-10-CM | POA: Diagnosis not present

## 2018-11-28 DIAGNOSIS — M79661 Pain in right lower leg: Secondary | ICD-10-CM | POA: Diagnosis not present

## 2018-11-28 DIAGNOSIS — R278 Other lack of coordination: Secondary | ICD-10-CM | POA: Diagnosis not present

## 2018-11-28 DIAGNOSIS — Z79899 Other long term (current) drug therapy: Secondary | ICD-10-CM | POA: Diagnosis not present

## 2018-12-01 DIAGNOSIS — E870 Hyperosmolality and hypernatremia: Secondary | ICD-10-CM | POA: Diagnosis not present

## 2018-12-01 DIAGNOSIS — U071 COVID-19: Secondary | ICD-10-CM | POA: Diagnosis not present

## 2018-12-01 DIAGNOSIS — J028 Acute pharyngitis due to other specified organisms: Secondary | ICD-10-CM | POA: Diagnosis not present

## 2018-12-03 DIAGNOSIS — M79661 Pain in right lower leg: Secondary | ICD-10-CM | POA: Diagnosis not present

## 2018-12-03 DIAGNOSIS — R1319 Other dysphagia: Secondary | ICD-10-CM | POA: Diagnosis not present

## 2018-12-03 DIAGNOSIS — R278 Other lack of coordination: Secondary | ICD-10-CM | POA: Diagnosis not present

## 2018-12-03 DIAGNOSIS — R1312 Dysphagia, oropharyngeal phase: Secondary | ICD-10-CM | POA: Diagnosis not present

## 2018-12-03 DIAGNOSIS — M79662 Pain in left lower leg: Secondary | ICD-10-CM | POA: Diagnosis not present

## 2018-12-03 DIAGNOSIS — R2681 Unsteadiness on feet: Secondary | ICD-10-CM | POA: Diagnosis not present

## 2018-12-03 DIAGNOSIS — Z9181 History of falling: Secondary | ICD-10-CM | POA: Diagnosis not present

## 2018-12-03 DIAGNOSIS — J9690 Respiratory failure, unspecified, unspecified whether with hypoxia or hypercapnia: Secondary | ICD-10-CM | POA: Diagnosis not present

## 2018-12-04 DIAGNOSIS — Z9181 History of falling: Secondary | ICD-10-CM | POA: Diagnosis not present

## 2018-12-04 DIAGNOSIS — J9690 Respiratory failure, unspecified, unspecified whether with hypoxia or hypercapnia: Secondary | ICD-10-CM | POA: Diagnosis not present

## 2018-12-04 DIAGNOSIS — R278 Other lack of coordination: Secondary | ICD-10-CM | POA: Diagnosis not present

## 2018-12-04 DIAGNOSIS — M79661 Pain in right lower leg: Secondary | ICD-10-CM | POA: Diagnosis not present

## 2018-12-04 DIAGNOSIS — M79662 Pain in left lower leg: Secondary | ICD-10-CM | POA: Diagnosis not present

## 2018-12-04 DIAGNOSIS — R1312 Dysphagia, oropharyngeal phase: Secondary | ICD-10-CM | POA: Diagnosis not present

## 2018-12-04 DIAGNOSIS — R1319 Other dysphagia: Secondary | ICD-10-CM | POA: Diagnosis not present

## 2018-12-04 DIAGNOSIS — R2681 Unsteadiness on feet: Secondary | ICD-10-CM | POA: Diagnosis not present

## 2018-12-06 DIAGNOSIS — R1319 Other dysphagia: Secondary | ICD-10-CM | POA: Diagnosis not present

## 2018-12-06 DIAGNOSIS — M79661 Pain in right lower leg: Secondary | ICD-10-CM | POA: Diagnosis not present

## 2018-12-06 DIAGNOSIS — R2681 Unsteadiness on feet: Secondary | ICD-10-CM | POA: Diagnosis not present

## 2018-12-06 DIAGNOSIS — R1312 Dysphagia, oropharyngeal phase: Secondary | ICD-10-CM | POA: Diagnosis not present

## 2018-12-06 DIAGNOSIS — Z9181 History of falling: Secondary | ICD-10-CM | POA: Diagnosis not present

## 2018-12-06 DIAGNOSIS — M79662 Pain in left lower leg: Secondary | ICD-10-CM | POA: Diagnosis not present

## 2018-12-06 DIAGNOSIS — J9690 Respiratory failure, unspecified, unspecified whether with hypoxia or hypercapnia: Secondary | ICD-10-CM | POA: Diagnosis not present

## 2018-12-06 DIAGNOSIS — R278 Other lack of coordination: Secondary | ICD-10-CM | POA: Diagnosis not present

## 2018-12-09 DIAGNOSIS — U071 COVID-19: Secondary | ICD-10-CM | POA: Diagnosis not present

## 2018-12-10 DIAGNOSIS — R1319 Other dysphagia: Secondary | ICD-10-CM | POA: Diagnosis not present

## 2018-12-10 DIAGNOSIS — M79662 Pain in left lower leg: Secondary | ICD-10-CM | POA: Diagnosis not present

## 2018-12-10 DIAGNOSIS — R1312 Dysphagia, oropharyngeal phase: Secondary | ICD-10-CM | POA: Diagnosis not present

## 2018-12-10 DIAGNOSIS — M79661 Pain in right lower leg: Secondary | ICD-10-CM | POA: Diagnosis not present

## 2018-12-10 DIAGNOSIS — R2681 Unsteadiness on feet: Secondary | ICD-10-CM | POA: Diagnosis not present

## 2018-12-10 DIAGNOSIS — R278 Other lack of coordination: Secondary | ICD-10-CM | POA: Diagnosis not present

## 2018-12-10 DIAGNOSIS — J9690 Respiratory failure, unspecified, unspecified whether with hypoxia or hypercapnia: Secondary | ICD-10-CM | POA: Diagnosis not present

## 2018-12-10 DIAGNOSIS — Z9181 History of falling: Secondary | ICD-10-CM | POA: Diagnosis not present

## 2018-12-11 DIAGNOSIS — R1319 Other dysphagia: Secondary | ICD-10-CM | POA: Diagnosis not present

## 2018-12-11 DIAGNOSIS — J9690 Respiratory failure, unspecified, unspecified whether with hypoxia or hypercapnia: Secondary | ICD-10-CM | POA: Diagnosis not present

## 2018-12-11 DIAGNOSIS — R278 Other lack of coordination: Secondary | ICD-10-CM | POA: Diagnosis not present

## 2018-12-11 DIAGNOSIS — Z9181 History of falling: Secondary | ICD-10-CM | POA: Diagnosis not present

## 2018-12-11 DIAGNOSIS — M79662 Pain in left lower leg: Secondary | ICD-10-CM | POA: Diagnosis not present

## 2018-12-11 DIAGNOSIS — R2681 Unsteadiness on feet: Secondary | ICD-10-CM | POA: Diagnosis not present

## 2018-12-11 DIAGNOSIS — M79661 Pain in right lower leg: Secondary | ICD-10-CM | POA: Diagnosis not present

## 2018-12-11 DIAGNOSIS — R1312 Dysphagia, oropharyngeal phase: Secondary | ICD-10-CM | POA: Diagnosis not present

## 2018-12-12 DIAGNOSIS — R1312 Dysphagia, oropharyngeal phase: Secondary | ICD-10-CM | POA: Diagnosis not present

## 2018-12-12 DIAGNOSIS — J9690 Respiratory failure, unspecified, unspecified whether with hypoxia or hypercapnia: Secondary | ICD-10-CM | POA: Diagnosis not present

## 2018-12-12 DIAGNOSIS — R278 Other lack of coordination: Secondary | ICD-10-CM | POA: Diagnosis not present

## 2018-12-12 DIAGNOSIS — M79661 Pain in right lower leg: Secondary | ICD-10-CM | POA: Diagnosis not present

## 2018-12-12 DIAGNOSIS — M79662 Pain in left lower leg: Secondary | ICD-10-CM | POA: Diagnosis not present

## 2018-12-12 DIAGNOSIS — R2681 Unsteadiness on feet: Secondary | ICD-10-CM | POA: Diagnosis not present

## 2018-12-12 DIAGNOSIS — R1319 Other dysphagia: Secondary | ICD-10-CM | POA: Diagnosis not present

## 2018-12-12 DIAGNOSIS — Z9181 History of falling: Secondary | ICD-10-CM | POA: Diagnosis not present

## 2018-12-16 DIAGNOSIS — U071 COVID-19: Secondary | ICD-10-CM | POA: Diagnosis not present

## 2018-12-17 DIAGNOSIS — R1319 Other dysphagia: Secondary | ICD-10-CM | POA: Diagnosis not present

## 2018-12-17 DIAGNOSIS — Z9181 History of falling: Secondary | ICD-10-CM | POA: Diagnosis not present

## 2018-12-17 DIAGNOSIS — R278 Other lack of coordination: Secondary | ICD-10-CM | POA: Diagnosis not present

## 2018-12-17 DIAGNOSIS — R2681 Unsteadiness on feet: Secondary | ICD-10-CM | POA: Diagnosis not present

## 2018-12-17 DIAGNOSIS — R1312 Dysphagia, oropharyngeal phase: Secondary | ICD-10-CM | POA: Diagnosis not present

## 2018-12-17 DIAGNOSIS — M79662 Pain in left lower leg: Secondary | ICD-10-CM | POA: Diagnosis not present

## 2018-12-17 DIAGNOSIS — J9690 Respiratory failure, unspecified, unspecified whether with hypoxia or hypercapnia: Secondary | ICD-10-CM | POA: Diagnosis not present

## 2018-12-17 DIAGNOSIS — M79661 Pain in right lower leg: Secondary | ICD-10-CM | POA: Diagnosis not present

## 2018-12-22 DIAGNOSIS — R1319 Other dysphagia: Secondary | ICD-10-CM | POA: Diagnosis not present

## 2018-12-22 DIAGNOSIS — J9690 Respiratory failure, unspecified, unspecified whether with hypoxia or hypercapnia: Secondary | ICD-10-CM | POA: Diagnosis not present

## 2018-12-22 DIAGNOSIS — R2681 Unsteadiness on feet: Secondary | ICD-10-CM | POA: Diagnosis not present

## 2018-12-22 DIAGNOSIS — M79661 Pain in right lower leg: Secondary | ICD-10-CM | POA: Diagnosis not present

## 2018-12-22 DIAGNOSIS — R1312 Dysphagia, oropharyngeal phase: Secondary | ICD-10-CM | POA: Diagnosis not present

## 2018-12-22 DIAGNOSIS — Z9181 History of falling: Secondary | ICD-10-CM | POA: Diagnosis not present

## 2018-12-22 DIAGNOSIS — R278 Other lack of coordination: Secondary | ICD-10-CM | POA: Diagnosis not present

## 2018-12-22 DIAGNOSIS — M79662 Pain in left lower leg: Secondary | ICD-10-CM | POA: Diagnosis not present

## 2018-12-23 DIAGNOSIS — R1319 Other dysphagia: Secondary | ICD-10-CM | POA: Diagnosis not present

## 2018-12-23 DIAGNOSIS — R1312 Dysphagia, oropharyngeal phase: Secondary | ICD-10-CM | POA: Diagnosis not present

## 2018-12-23 DIAGNOSIS — Z9181 History of falling: Secondary | ICD-10-CM | POA: Diagnosis not present

## 2018-12-23 DIAGNOSIS — R278 Other lack of coordination: Secondary | ICD-10-CM | POA: Diagnosis not present

## 2018-12-23 DIAGNOSIS — M79662 Pain in left lower leg: Secondary | ICD-10-CM | POA: Diagnosis not present

## 2018-12-23 DIAGNOSIS — R2681 Unsteadiness on feet: Secondary | ICD-10-CM | POA: Diagnosis not present

## 2018-12-23 DIAGNOSIS — J9690 Respiratory failure, unspecified, unspecified whether with hypoxia or hypercapnia: Secondary | ICD-10-CM | POA: Diagnosis not present

## 2018-12-23 DIAGNOSIS — U071 COVID-19: Secondary | ICD-10-CM | POA: Diagnosis not present

## 2018-12-23 DIAGNOSIS — M79661 Pain in right lower leg: Secondary | ICD-10-CM | POA: Diagnosis not present

## 2018-12-26 DIAGNOSIS — M79661 Pain in right lower leg: Secondary | ICD-10-CM | POA: Diagnosis not present

## 2018-12-26 DIAGNOSIS — Z9181 History of falling: Secondary | ICD-10-CM | POA: Diagnosis not present

## 2018-12-26 DIAGNOSIS — R1319 Other dysphagia: Secondary | ICD-10-CM | POA: Diagnosis not present

## 2018-12-26 DIAGNOSIS — R2681 Unsteadiness on feet: Secondary | ICD-10-CM | POA: Diagnosis not present

## 2018-12-26 DIAGNOSIS — J9690 Respiratory failure, unspecified, unspecified whether with hypoxia or hypercapnia: Secondary | ICD-10-CM | POA: Diagnosis not present

## 2018-12-26 DIAGNOSIS — R1312 Dysphagia, oropharyngeal phase: Secondary | ICD-10-CM | POA: Diagnosis not present

## 2018-12-26 DIAGNOSIS — R278 Other lack of coordination: Secondary | ICD-10-CM | POA: Diagnosis not present

## 2018-12-26 DIAGNOSIS — M79662 Pain in left lower leg: Secondary | ICD-10-CM | POA: Diagnosis not present

## 2018-12-27 DIAGNOSIS — R1319 Other dysphagia: Secondary | ICD-10-CM | POA: Diagnosis not present

## 2018-12-27 DIAGNOSIS — M79661 Pain in right lower leg: Secondary | ICD-10-CM | POA: Diagnosis not present

## 2018-12-27 DIAGNOSIS — M79662 Pain in left lower leg: Secondary | ICD-10-CM | POA: Diagnosis not present

## 2018-12-27 DIAGNOSIS — Z9181 History of falling: Secondary | ICD-10-CM | POA: Diagnosis not present

## 2018-12-27 DIAGNOSIS — R1312 Dysphagia, oropharyngeal phase: Secondary | ICD-10-CM | POA: Diagnosis not present

## 2018-12-27 DIAGNOSIS — R278 Other lack of coordination: Secondary | ICD-10-CM | POA: Diagnosis not present

## 2018-12-27 DIAGNOSIS — R2681 Unsteadiness on feet: Secondary | ICD-10-CM | POA: Diagnosis not present

## 2018-12-27 DIAGNOSIS — J9690 Respiratory failure, unspecified, unspecified whether with hypoxia or hypercapnia: Secondary | ICD-10-CM | POA: Diagnosis not present

## 2018-12-30 DIAGNOSIS — R278 Other lack of coordination: Secondary | ICD-10-CM | POA: Diagnosis not present

## 2018-12-30 DIAGNOSIS — Z9181 History of falling: Secondary | ICD-10-CM | POA: Diagnosis not present

## 2018-12-30 DIAGNOSIS — U071 COVID-19: Secondary | ICD-10-CM | POA: Diagnosis not present

## 2018-12-30 DIAGNOSIS — M79661 Pain in right lower leg: Secondary | ICD-10-CM | POA: Diagnosis not present

## 2018-12-30 DIAGNOSIS — R2681 Unsteadiness on feet: Secondary | ICD-10-CM | POA: Diagnosis not present

## 2018-12-30 DIAGNOSIS — M79662 Pain in left lower leg: Secondary | ICD-10-CM | POA: Diagnosis not present

## 2018-12-30 DIAGNOSIS — J9690 Respiratory failure, unspecified, unspecified whether with hypoxia or hypercapnia: Secondary | ICD-10-CM | POA: Diagnosis not present

## 2018-12-30 DIAGNOSIS — R1312 Dysphagia, oropharyngeal phase: Secondary | ICD-10-CM | POA: Diagnosis not present

## 2018-12-30 DIAGNOSIS — R1319 Other dysphagia: Secondary | ICD-10-CM | POA: Diagnosis not present

## 2018-12-31 DIAGNOSIS — Z9181 History of falling: Secondary | ICD-10-CM | POA: Diagnosis not present

## 2018-12-31 DIAGNOSIS — R2681 Unsteadiness on feet: Secondary | ICD-10-CM | POA: Diagnosis not present

## 2018-12-31 DIAGNOSIS — R278 Other lack of coordination: Secondary | ICD-10-CM | POA: Diagnosis not present

## 2018-12-31 DIAGNOSIS — M79661 Pain in right lower leg: Secondary | ICD-10-CM | POA: Diagnosis not present

## 2018-12-31 DIAGNOSIS — R1312 Dysphagia, oropharyngeal phase: Secondary | ICD-10-CM | POA: Diagnosis not present

## 2018-12-31 DIAGNOSIS — J9690 Respiratory failure, unspecified, unspecified whether with hypoxia or hypercapnia: Secondary | ICD-10-CM | POA: Diagnosis not present

## 2018-12-31 DIAGNOSIS — R1319 Other dysphagia: Secondary | ICD-10-CM | POA: Diagnosis not present

## 2018-12-31 DIAGNOSIS — M79662 Pain in left lower leg: Secondary | ICD-10-CM | POA: Diagnosis not present

## 2019-01-05 DIAGNOSIS — R278 Other lack of coordination: Secondary | ICD-10-CM | POA: Diagnosis not present

## 2019-01-05 DIAGNOSIS — R1319 Other dysphagia: Secondary | ICD-10-CM | POA: Diagnosis not present

## 2019-01-05 DIAGNOSIS — J9690 Respiratory failure, unspecified, unspecified whether with hypoxia or hypercapnia: Secondary | ICD-10-CM | POA: Diagnosis not present

## 2019-01-05 DIAGNOSIS — R1312 Dysphagia, oropharyngeal phase: Secondary | ICD-10-CM | POA: Diagnosis not present

## 2019-01-05 DIAGNOSIS — R2681 Unsteadiness on feet: Secondary | ICD-10-CM | POA: Diagnosis not present

## 2019-01-05 DIAGNOSIS — Z9181 History of falling: Secondary | ICD-10-CM | POA: Diagnosis not present

## 2019-01-05 DIAGNOSIS — M79662 Pain in left lower leg: Secondary | ICD-10-CM | POA: Diagnosis not present

## 2019-01-05 DIAGNOSIS — M79661 Pain in right lower leg: Secondary | ICD-10-CM | POA: Diagnosis not present

## 2019-01-06 DIAGNOSIS — R569 Unspecified convulsions: Secondary | ICD-10-CM | POA: Diagnosis not present

## 2019-01-06 DIAGNOSIS — R278 Other lack of coordination: Secondary | ICD-10-CM | POA: Diagnosis not present

## 2019-01-06 DIAGNOSIS — J9690 Respiratory failure, unspecified, unspecified whether with hypoxia or hypercapnia: Secondary | ICD-10-CM | POA: Diagnosis not present

## 2019-01-06 DIAGNOSIS — R1312 Dysphagia, oropharyngeal phase: Secondary | ICD-10-CM | POA: Diagnosis not present

## 2019-01-06 DIAGNOSIS — R2681 Unsteadiness on feet: Secondary | ICD-10-CM | POA: Diagnosis not present

## 2019-01-06 DIAGNOSIS — M79662 Pain in left lower leg: Secondary | ICD-10-CM | POA: Diagnosis not present

## 2019-01-06 DIAGNOSIS — M79661 Pain in right lower leg: Secondary | ICD-10-CM | POA: Diagnosis not present

## 2019-01-06 DIAGNOSIS — U071 COVID-19: Secondary | ICD-10-CM | POA: Diagnosis not present

## 2019-01-06 DIAGNOSIS — Z9181 History of falling: Secondary | ICD-10-CM | POA: Diagnosis not present

## 2019-01-06 DIAGNOSIS — R1319 Other dysphagia: Secondary | ICD-10-CM | POA: Diagnosis not present

## 2019-01-06 DIAGNOSIS — I69959 Hemiplegia and hemiparesis following unspecified cerebrovascular disease affecting unspecified side: Secondary | ICD-10-CM | POA: Diagnosis not present

## 2019-01-07 DIAGNOSIS — M79662 Pain in left lower leg: Secondary | ICD-10-CM | POA: Diagnosis not present

## 2019-01-07 DIAGNOSIS — Z9181 History of falling: Secondary | ICD-10-CM | POA: Diagnosis not present

## 2019-01-07 DIAGNOSIS — R278 Other lack of coordination: Secondary | ICD-10-CM | POA: Diagnosis not present

## 2019-01-07 DIAGNOSIS — R1319 Other dysphagia: Secondary | ICD-10-CM | POA: Diagnosis not present

## 2019-01-07 DIAGNOSIS — R2681 Unsteadiness on feet: Secondary | ICD-10-CM | POA: Diagnosis not present

## 2019-01-07 DIAGNOSIS — J9690 Respiratory failure, unspecified, unspecified whether with hypoxia or hypercapnia: Secondary | ICD-10-CM | POA: Diagnosis not present

## 2019-01-07 DIAGNOSIS — R1312 Dysphagia, oropharyngeal phase: Secondary | ICD-10-CM | POA: Diagnosis not present

## 2019-01-07 DIAGNOSIS — M79661 Pain in right lower leg: Secondary | ICD-10-CM | POA: Diagnosis not present

## 2019-01-08 DIAGNOSIS — M79661 Pain in right lower leg: Secondary | ICD-10-CM | POA: Diagnosis not present

## 2019-01-08 DIAGNOSIS — J9690 Respiratory failure, unspecified, unspecified whether with hypoxia or hypercapnia: Secondary | ICD-10-CM | POA: Diagnosis not present

## 2019-01-08 DIAGNOSIS — R1312 Dysphagia, oropharyngeal phase: Secondary | ICD-10-CM | POA: Diagnosis not present

## 2019-01-08 DIAGNOSIS — R278 Other lack of coordination: Secondary | ICD-10-CM | POA: Diagnosis not present

## 2019-01-08 DIAGNOSIS — M79662 Pain in left lower leg: Secondary | ICD-10-CM | POA: Diagnosis not present

## 2019-01-08 DIAGNOSIS — R2681 Unsteadiness on feet: Secondary | ICD-10-CM | POA: Diagnosis not present

## 2019-01-08 DIAGNOSIS — Z9181 History of falling: Secondary | ICD-10-CM | POA: Diagnosis not present

## 2019-01-08 DIAGNOSIS — R1319 Other dysphagia: Secondary | ICD-10-CM | POA: Diagnosis not present

## 2019-01-09 DIAGNOSIS — M79662 Pain in left lower leg: Secondary | ICD-10-CM | POA: Diagnosis not present

## 2019-01-09 DIAGNOSIS — R2681 Unsteadiness on feet: Secondary | ICD-10-CM | POA: Diagnosis not present

## 2019-01-09 DIAGNOSIS — M79661 Pain in right lower leg: Secondary | ICD-10-CM | POA: Diagnosis not present

## 2019-01-09 DIAGNOSIS — R1312 Dysphagia, oropharyngeal phase: Secondary | ICD-10-CM | POA: Diagnosis not present

## 2019-01-09 DIAGNOSIS — R278 Other lack of coordination: Secondary | ICD-10-CM | POA: Diagnosis not present

## 2019-01-09 DIAGNOSIS — J9690 Respiratory failure, unspecified, unspecified whether with hypoxia or hypercapnia: Secondary | ICD-10-CM | POA: Diagnosis not present

## 2019-01-09 DIAGNOSIS — Z9181 History of falling: Secondary | ICD-10-CM | POA: Diagnosis not present

## 2019-01-09 DIAGNOSIS — R1319 Other dysphagia: Secondary | ICD-10-CM | POA: Diagnosis not present

## 2019-01-14 DIAGNOSIS — U071 COVID-19: Secondary | ICD-10-CM | POA: Diagnosis not present

## 2019-01-27 DIAGNOSIS — J984 Other disorders of lung: Secondary | ICD-10-CM | POA: Diagnosis not present

## 2019-01-27 DIAGNOSIS — I10 Essential (primary) hypertension: Secondary | ICD-10-CM | POA: Diagnosis not present

## 2019-01-27 DIAGNOSIS — E870 Hyperosmolality and hypernatremia: Secondary | ICD-10-CM | POA: Diagnosis not present

## 2019-01-28 DIAGNOSIS — Z79899 Other long term (current) drug therapy: Secondary | ICD-10-CM | POA: Diagnosis not present

## 2019-01-29 DIAGNOSIS — E612 Magnesium deficiency: Secondary | ICD-10-CM | POA: Diagnosis not present

## 2019-02-19 DIAGNOSIS — R569 Unspecified convulsions: Secondary | ICD-10-CM | POA: Diagnosis not present

## 2019-02-19 DIAGNOSIS — I69969 Other paralytic syndrome following unspecified cerebrovascular disease affecting unspecified side: Secondary | ICD-10-CM | POA: Diagnosis not present

## 2019-02-23 ENCOUNTER — Emergency Department (HOSPITAL_COMMUNITY): Payer: Medicare Other

## 2019-02-23 ENCOUNTER — Inpatient Hospital Stay (HOSPITAL_COMMUNITY)
Admission: EM | Admit: 2019-02-23 | Discharge: 2019-03-06 | DRG: 208 | Disposition: A | Discharge status: Skilled Nursing Facility | Payer: Medicare Other | Source: Skilled Nursing Facility | Attending: Internal Medicine | Admitting: Internal Medicine

## 2019-02-23 ENCOUNTER — Inpatient Hospital Stay (HOSPITAL_COMMUNITY): Payer: Medicare Other

## 2019-02-23 DIAGNOSIS — Z20822 Contact with and (suspected) exposure to covid-19: Secondary | ICD-10-CM | POA: Diagnosis present

## 2019-02-23 DIAGNOSIS — J449 Chronic obstructive pulmonary disease, unspecified: Secondary | ICD-10-CM | POA: Diagnosis not present

## 2019-02-23 DIAGNOSIS — F0150 Vascular dementia without behavioral disturbance: Secondary | ICD-10-CM | POA: Diagnosis present

## 2019-02-23 DIAGNOSIS — Z87891 Personal history of nicotine dependence: Secondary | ICD-10-CM

## 2019-02-23 DIAGNOSIS — E878 Other disorders of electrolyte and fluid balance, not elsewhere classified: Secondary | ICD-10-CM | POA: Diagnosis not present

## 2019-02-23 DIAGNOSIS — Z880 Allergy status to penicillin: Secondary | ICD-10-CM

## 2019-02-23 DIAGNOSIS — Z8679 Personal history of other diseases of the circulatory system: Secondary | ICD-10-CM

## 2019-02-23 DIAGNOSIS — I160 Hypertensive urgency: Secondary | ICD-10-CM | POA: Diagnosis present

## 2019-02-23 DIAGNOSIS — R0689 Other abnormalities of breathing: Secondary | ICD-10-CM | POA: Diagnosis not present

## 2019-02-23 DIAGNOSIS — N390 Urinary tract infection, site not specified: Secondary | ICD-10-CM | POA: Diagnosis not present

## 2019-02-23 DIAGNOSIS — Z9089 Acquired absence of other organs: Secondary | ICD-10-CM

## 2019-02-23 DIAGNOSIS — Z4682 Encounter for fitting and adjustment of non-vascular catheter: Secondary | ICD-10-CM | POA: Diagnosis not present

## 2019-02-23 DIAGNOSIS — L89151 Pressure ulcer of sacral region, stage 1: Secondary | ICD-10-CM | POA: Diagnosis not present

## 2019-02-23 DIAGNOSIS — Z823 Family history of stroke: Secondary | ICD-10-CM

## 2019-02-23 DIAGNOSIS — J9690 Respiratory failure, unspecified, unspecified whether with hypoxia or hypercapnia: Secondary | ICD-10-CM

## 2019-02-23 DIAGNOSIS — Z09 Encounter for follow-up examination after completed treatment for conditions other than malignant neoplasm: Secondary | ICD-10-CM

## 2019-02-23 DIAGNOSIS — I739 Peripheral vascular disease, unspecified: Secondary | ICD-10-CM | POA: Diagnosis present

## 2019-02-23 DIAGNOSIS — T17908A Unspecified foreign body in respiratory tract, part unspecified causing other injury, initial encounter: Secondary | ICD-10-CM

## 2019-02-23 DIAGNOSIS — Z800 Family history of malignant neoplasm of digestive organs: Secondary | ICD-10-CM

## 2019-02-23 DIAGNOSIS — Z743 Need for continuous supervision: Secondary | ICD-10-CM | POA: Diagnosis not present

## 2019-02-23 DIAGNOSIS — Z808 Family history of malignant neoplasm of other organs or systems: Secondary | ICD-10-CM

## 2019-02-23 DIAGNOSIS — E876 Hypokalemia: Secondary | ICD-10-CM | POA: Diagnosis present

## 2019-02-23 DIAGNOSIS — I10 Essential (primary) hypertension: Secondary | ICD-10-CM | POA: Diagnosis present

## 2019-02-23 DIAGNOSIS — J9811 Atelectasis: Secondary | ICD-10-CM | POA: Diagnosis not present

## 2019-02-23 DIAGNOSIS — Z87442 Personal history of urinary calculi: Secondary | ICD-10-CM

## 2019-02-23 DIAGNOSIS — R0902 Hypoxemia: Secondary | ICD-10-CM | POA: Diagnosis not present

## 2019-02-23 DIAGNOSIS — R1310 Dysphagia, unspecified: Secondary | ICD-10-CM | POA: Diagnosis not present

## 2019-02-23 DIAGNOSIS — R0602 Shortness of breath: Secondary | ICD-10-CM | POA: Diagnosis present

## 2019-02-23 DIAGNOSIS — J690 Pneumonitis due to inhalation of food and vomit: Principal | ICD-10-CM | POA: Diagnosis present

## 2019-02-23 DIAGNOSIS — Z95820 Peripheral vascular angioplasty status with implants and grafts: Secondary | ICD-10-CM

## 2019-02-23 DIAGNOSIS — Z4659 Encounter for fitting and adjustment of other gastrointestinal appliance and device: Secondary | ICD-10-CM

## 2019-02-23 DIAGNOSIS — R569 Unspecified convulsions: Secondary | ICD-10-CM | POA: Diagnosis not present

## 2019-02-23 DIAGNOSIS — Z79899 Other long term (current) drug therapy: Secondary | ICD-10-CM

## 2019-02-23 DIAGNOSIS — J9602 Acute respiratory failure with hypercapnia: Secondary | ICD-10-CM | POA: Diagnosis not present

## 2019-02-23 DIAGNOSIS — R064 Hyperventilation: Secondary | ICD-10-CM | POA: Diagnosis not present

## 2019-02-23 DIAGNOSIS — G9341 Metabolic encephalopathy: Secondary | ICD-10-CM | POA: Diagnosis not present

## 2019-02-23 DIAGNOSIS — R000 Tachycardia, unspecified: Secondary | ICD-10-CM | POA: Diagnosis not present

## 2019-02-23 DIAGNOSIS — J9600 Acute respiratory failure, unspecified whether with hypoxia or hypercapnia: Secondary | ICD-10-CM | POA: Diagnosis not present

## 2019-02-23 DIAGNOSIS — R278 Other lack of coordination: Secondary | ICD-10-CM | POA: Diagnosis not present

## 2019-02-23 DIAGNOSIS — Z1612 Extended spectrum beta lactamase (ESBL) resistance: Secondary | ICD-10-CM | POA: Diagnosis not present

## 2019-02-23 DIAGNOSIS — J984 Other disorders of lung: Secondary | ICD-10-CM | POA: Diagnosis not present

## 2019-02-23 DIAGNOSIS — B9620 Unspecified Escherichia coli [E. coli] as the cause of diseases classified elsewhere: Secondary | ICD-10-CM | POA: Diagnosis not present

## 2019-02-23 DIAGNOSIS — J9601 Acute respiratory failure with hypoxia: Secondary | ICD-10-CM | POA: Diagnosis not present

## 2019-02-23 DIAGNOSIS — E43 Unspecified severe protein-calorie malnutrition: Secondary | ICD-10-CM | POA: Diagnosis not present

## 2019-02-23 DIAGNOSIS — Z8249 Family history of ischemic heart disease and other diseases of the circulatory system: Secondary | ICD-10-CM

## 2019-02-23 DIAGNOSIS — R1312 Dysphagia, oropharyngeal phase: Secondary | ICD-10-CM | POA: Diagnosis not present

## 2019-02-23 DIAGNOSIS — E870 Hyperosmolality and hypernatremia: Secondary | ICD-10-CM | POA: Diagnosis not present

## 2019-02-23 DIAGNOSIS — M6281 Muscle weakness (generalized): Secondary | ICD-10-CM | POA: Diagnosis not present

## 2019-02-23 DIAGNOSIS — R0989 Other specified symptoms and signs involving the circulatory and respiratory systems: Secondary | ICD-10-CM | POA: Diagnosis not present

## 2019-02-23 DIAGNOSIS — L89626 Pressure-induced deep tissue damage of left heel: Secondary | ICD-10-CM | POA: Diagnosis not present

## 2019-02-23 DIAGNOSIS — T17920A Food in respiratory tract, part unspecified causing asphyxiation, initial encounter: Secondary | ICD-10-CM | POA: Diagnosis not present

## 2019-02-23 DIAGNOSIS — R279 Unspecified lack of coordination: Secondary | ICD-10-CM | POA: Diagnosis not present

## 2019-02-23 DIAGNOSIS — R296 Repeated falls: Secondary | ICD-10-CM | POA: Diagnosis present

## 2019-02-23 DIAGNOSIS — Z7902 Long term (current) use of antithrombotics/antiplatelets: Secondary | ICD-10-CM

## 2019-02-23 DIAGNOSIS — E872 Acidosis: Secondary | ICD-10-CM | POA: Diagnosis present

## 2019-02-23 DIAGNOSIS — R531 Weakness: Secondary | ICD-10-CM | POA: Diagnosis not present

## 2019-02-23 DIAGNOSIS — Z885 Allergy status to narcotic agent status: Secondary | ICD-10-CM

## 2019-02-23 DIAGNOSIS — R069 Unspecified abnormalities of breathing: Secondary | ICD-10-CM | POA: Diagnosis not present

## 2019-02-23 DIAGNOSIS — Z9181 History of falling: Secondary | ICD-10-CM

## 2019-02-23 DIAGNOSIS — G40409 Other generalized epilepsy and epileptic syndromes, not intractable, without status epilepticus: Secondary | ICD-10-CM | POA: Diagnosis present

## 2019-02-23 DIAGNOSIS — Z833 Family history of diabetes mellitus: Secondary | ICD-10-CM

## 2019-02-23 DIAGNOSIS — R262 Difficulty in walking, not elsewhere classified: Secondary | ICD-10-CM | POA: Diagnosis not present

## 2019-02-23 DIAGNOSIS — J181 Lobar pneumonia, unspecified organism: Secondary | ICD-10-CM | POA: Diagnosis not present

## 2019-02-23 DIAGNOSIS — T17400A Unspecified foreign body in trachea causing asphyxiation, initial encounter: Secondary | ICD-10-CM | POA: Diagnosis not present

## 2019-02-23 DIAGNOSIS — Z820 Family history of epilepsy and other diseases of the nervous system: Secondary | ICD-10-CM

## 2019-02-23 DIAGNOSIS — I444 Left anterior fascicular block: Secondary | ICD-10-CM | POA: Diagnosis not present

## 2019-02-23 DIAGNOSIS — R2681 Unsteadiness on feet: Secondary | ICD-10-CM | POA: Diagnosis not present

## 2019-02-23 DIAGNOSIS — F1011 Alcohol abuse, in remission: Secondary | ICD-10-CM | POA: Diagnosis present

## 2019-02-23 LAB — COMPREHENSIVE METABOLIC PANEL
ALT: 24 U/L (ref 0–44)
AST: 25 U/L (ref 15–41)
Albumin: 3 g/dL — ABNORMAL LOW (ref 3.5–5.0)
Alkaline Phosphatase: 136 U/L — ABNORMAL HIGH (ref 38–126)
Anion gap: 14 (ref 5–15)
BUN: 8 mg/dL (ref 8–23)
CO2: 27 mmol/L (ref 22–32)
Calcium: 8.1 mg/dL — ABNORMAL LOW (ref 8.9–10.3)
Chloride: 106 mmol/L (ref 98–111)
Creatinine, Ser: 0.61 mg/dL (ref 0.61–1.24)
GFR calc Af Amer: >60 mL/min (ref >60)
GFR calc non Af Amer: >60 mL/min (ref >60)
Glucose, Bld: 133 mg/dL — ABNORMAL HIGH (ref 70–99)
Potassium: 3.1 mmol/L — ABNORMAL LOW (ref 3.5–5.1)
Sodium: 147 mmol/L — ABNORMAL HIGH (ref 135–145)
Total Bilirubin: 0.5 mg/dL (ref 0.3–1.2)
Total Protein: 6.8 g/dL (ref 6.5–8.1)

## 2019-02-23 LAB — URINALYSIS, ROUTINE W REFLEX MICROSCOPIC
Bilirubin Urine: NEGATIVE
Glucose, UA: NEGATIVE mg/dL
Hgb urine dipstick: NEGATIVE
Ketones, ur: NEGATIVE mg/dL
Leukocytes,Ua: NEGATIVE
Nitrite: NEGATIVE
Protein, ur: 30 mg/dL — AB
Specific Gravity, Urine: 1.016 (ref 1.005–1.030)
pH: 6 (ref 5.0–8.0)

## 2019-02-23 LAB — CBC WITH DIFFERENTIAL/PLATELET
Abs Immature Granulocytes: 0.06 10*3/uL (ref 0.00–0.07)
Basophils Absolute: 0.1 10*3/uL (ref 0.0–0.1)
Basophils Relative: 0 %
Eosinophils Absolute: 0 10*3/uL (ref 0.0–0.5)
Eosinophils Relative: 0 %
HCT: 47.4 % (ref 39.0–52.0)
Hemoglobin: 15.2 g/dL (ref 13.0–17.0)
Immature Granulocytes: 0 %
Lymphocytes Relative: 20 %
Lymphs Abs: 3 10*3/uL (ref 0.7–4.0)
MCH: 32.8 pg (ref 26.0–34.0)
MCHC: 32.1 g/dL (ref 30.0–36.0)
MCV: 102.4 fL — ABNORMAL HIGH (ref 80.0–100.0)
Monocytes Absolute: 0.5 10*3/uL (ref 0.1–1.0)
Monocytes Relative: 3 %
Neutro Abs: 11.4 10*3/uL — ABNORMAL HIGH (ref 1.7–7.7)
Neutrophils Relative %: 77 %
Platelets: 370 10*3/uL (ref 150–400)
RBC: 4.63 MIL/uL (ref 4.22–5.81)
RDW: 14.4 % (ref 11.5–15.5)
WBC: 15.1 10*3/uL — ABNORMAL HIGH (ref 4.0–10.5)
nRBC: 0 % (ref 0.0–0.2)

## 2019-02-23 LAB — POCT I-STAT 7, (LYTES, BLD GAS, ICA,H+H)
Acid-Base Excess: 6 mmol/L — ABNORMAL HIGH (ref 0.0–2.0)
Bicarbonate: 36 mmol/L — ABNORMAL HIGH (ref 20.0–28.0)
Calcium, Ion: 1.23 mmol/L (ref 1.15–1.40)
HCT: 48 % (ref 39.0–52.0)
Hemoglobin: 16.3 g/dL (ref 13.0–17.0)
O2 Saturation: 100 %
Patient temperature: 97.7
Potassium: 3 mmol/L — ABNORMAL LOW (ref 3.5–5.1)
Sodium: 147 mmol/L — ABNORMAL HIGH (ref 135–145)
TCO2: 38 mmol/L — ABNORMAL HIGH (ref 22–32)
pCO2 arterial: 75.7 mmHg (ref 32.0–48.0)
pH, Arterial: 7.283 — ABNORMAL LOW (ref 7.350–7.450)
pO2, Arterial: 242 mmHg — ABNORMAL HIGH (ref 83.0–108.0)

## 2019-02-23 LAB — LACTIC ACID, PLASMA
Lactic Acid, Venous: 2.4 mmol/L (ref 0.5–1.9)
Lactic Acid, Venous: 5.2 mmol/L (ref 0.5–1.9)

## 2019-02-23 LAB — CARBAMAZEPINE LEVEL, TOTAL: Carbamazepine Lvl: 5.2 ug/mL (ref 4.0–12.0)

## 2019-02-23 LAB — TROPONIN I (HIGH SENSITIVITY)
Troponin I (High Sensitivity): 11 ng/L (ref <18)
Troponin I (High Sensitivity): 30 ng/L — ABNORMAL HIGH

## 2019-02-23 LAB — RESPIRATORY PANEL BY RT PCR (FLU A&B, COVID)
Influenza A by PCR: NEGATIVE
Influenza B by PCR: NEGATIVE
SARS Coronavirus 2 by RT PCR: NEGATIVE

## 2019-02-23 LAB — MRSA PCR SCREENING: MRSA by PCR: NEGATIVE

## 2019-02-23 LAB — VALPROIC ACID LEVEL: Valproic Acid Lvl: 31 ug/mL — ABNORMAL LOW (ref 50.0–100.0)

## 2019-02-23 LAB — PROCALCITONIN: Procalcitonin: 2.65 ng/mL

## 2019-02-23 LAB — ETHANOL: Alcohol, Ethyl (B): <10 mg/dL (ref <10)

## 2019-02-23 LAB — BRAIN NATRIURETIC PEPTIDE: B Natriuretic Peptide: 76.6 pg/mL (ref 0.0–100.0)

## 2019-02-23 MED ORDER — VANCOMYCIN HCL 1250 MG/250ML IV SOLN: 1250.0000 mg | INTRAVENOUS | Status: DC

## 2019-02-23 MED ORDER — CHLORHEXIDINE GLUCONATE CLOTH 2 % EX PADS
6.0000 | MEDICATED_PAD | Freq: Every day | CUTANEOUS | Status: DC
  Administered 2019-02-24 – 2019-02-27 (×4): 6 via TOPICAL

## 2019-02-23 MED ORDER — VANCOMYCIN HCL IN DEXTROSE 1-5 GM/200ML-% IV SOLN
1000.0000 mg | Freq: Once | INTRAVENOUS | Status: AC
  Administered 2019-02-23: 18:00:00 1000 mg via INTRAVENOUS
  Filled 2019-02-23: qty 200

## 2019-02-23 MED ORDER — ETOMIDATE 2 MG/ML IV SOLN
INTRAVENOUS | Status: AC | PRN
  Administered 2019-02-23: 20 mg via INTRAVENOUS

## 2019-02-23 MED ORDER — CLOPIDOGREL BISULFATE 75 MG PO TABS
75.0000 mg | ORAL_TABLET | Freq: Every day | ORAL | Status: DC
  Administered 2019-02-24 – 2019-03-06 (×11): 75 mg
  Filled 2019-02-23 (×12): qty 1

## 2019-02-23 MED ORDER — HEPARIN SODIUM (PORCINE) 5000 UNIT/ML IJ SOLN
5000.0000 [IU] | Freq: Three times a day (TID) | INTRAMUSCULAR | Status: DC
  Administered 2019-02-23 – 2019-03-06 (×32): 5000 [IU] via SUBCUTANEOUS
  Filled 2019-02-23 (×32): qty 1

## 2019-02-23 MED ORDER — ACETAMINOPHEN 160 MG/5ML PO SOLN
500.0000 mg | Freq: Three times a day (TID) | ORAL | Status: DC | PRN
  Administered 2019-02-26: 21:00:00 500 mg via ORAL
  Filled 2019-02-23 (×2): qty 20.3

## 2019-02-23 MED ORDER — CHLORHEXIDINE GLUCONATE 0.12% ORAL RINSE (MEDLINE KIT)
15.0000 mL | Freq: Two times a day (BID) | OROMUCOSAL | Status: DC
  Administered 2019-02-23 – 2019-03-06 (×19): 15 mL via OROMUCOSAL
  Filled 2019-02-23: qty 15

## 2019-02-23 MED ORDER — BISACODYL 10 MG RE SUPP: 10.0000 mg | Freq: Every day | RECTAL | Status: DC | PRN

## 2019-02-23 MED ORDER — VALPROIC ACID 250 MG/5ML PO SOLN
750.0000 mg | Freq: Three times a day (TID) | ORAL | Status: DC
  Administered 2019-02-23 – 2019-03-06 (×31): 750 mg
  Filled 2019-02-23 (×36): qty 15

## 2019-02-23 MED ORDER — ORAL CARE MOUTH RINSE
15.0000 mL | OROMUCOSAL | Status: DC
  Administered 2019-02-23 – 2019-03-06 (×95): 15 mL via OROMUCOSAL

## 2019-02-23 MED ORDER — PROPOFOL 1000 MG/100ML IV EMUL
INTRAVENOUS | Status: AC
  Administered 2019-02-23: 17:00:00 10 ug/kg/min via INTRAVENOUS
  Filled 2019-02-23: qty 100

## 2019-02-23 MED ORDER — SODIUM CHLORIDE 0.9 % IV SOLN
2.0000 g | Freq: Three times a day (TID) | INTRAVENOUS | Status: DC
  Administered 2019-02-23 – 2019-02-25 (×5): 2 g via INTRAVENOUS
  Filled 2019-02-23 (×5): qty 2

## 2019-02-23 MED ORDER — MIDAZOLAM HCL 2 MG/2ML IJ SOLN
2.0000 mg | INTRAMUSCULAR | Status: DC | PRN
  Administered 2019-02-23: 21:00:00 2 mg via INTRAVENOUS

## 2019-02-23 MED ORDER — CARBAMAZEPINE 200 MG PO TABS
300.0000 mg | ORAL_TABLET | Freq: Two times a day (BID) | ORAL | Status: DC
  Administered 2019-02-23 – 2019-03-06 (×21): 300 mg
  Filled 2019-02-23 (×23): qty 1.5

## 2019-02-23 MED ORDER — MIDAZOLAM HCL 2 MG/2ML IJ SOLN
2.0000 mg | INTRAMUSCULAR | Status: DC | PRN
  Filled 2019-02-23: qty 2

## 2019-02-23 MED ORDER — PANTOPRAZOLE SODIUM 40 MG IV SOLR
40.0000 mg | Freq: Every day | INTRAVENOUS | Status: DC
  Administered 2019-02-23 – 2019-03-04 (×10): 40 mg via INTRAVENOUS
  Filled 2019-02-23 (×10): qty 40

## 2019-02-23 MED ORDER — LABETALOL HCL 5 MG/ML IV SOLN
10.0000 mg | INTRAVENOUS | Status: DC | PRN
  Administered 2019-02-24: 10 mg via INTRAVENOUS
  Administered 2019-02-25 (×3): 20 mg via INTRAVENOUS
  Administered 2019-02-25: 10 mg via INTRAVENOUS
  Administered 2019-02-26 (×2): 20 mg via INTRAVENOUS
  Filled 2019-02-23 (×8): qty 4

## 2019-02-23 MED ORDER — PROPOFOL 1000 MG/100ML IV EMUL
5.0000 ug/kg/min | INTRAVENOUS | Status: DC
  Administered 2019-02-23: 18:00:00 10 ug/kg/min via INTRAVENOUS
  Administered 2019-02-23: 55 ug/kg/min via INTRAVENOUS
  Administered 2019-02-24 – 2019-02-25 (×2): 20 ug/kg/min via INTRAVENOUS
  Filled 2019-02-23 (×3): qty 100

## 2019-02-23 MED ORDER — SODIUM CHLORIDE 0.9 % IV SOLN
2.0000 g | Freq: Once | INTRAVENOUS | Status: AC
  Administered 2019-02-23: 17:00:00 2 g via INTRAVENOUS
  Filled 2019-02-23: qty 2

## 2019-02-23 MED ORDER — LEVETIRACETAM 100 MG/ML PO SOLN
1500.0000 mg | Freq: Two times a day (BID) | ORAL | Status: DC
  Administered 2019-02-23 – 2019-03-06 (×21): 1500 mg
  Filled 2019-02-23 (×3): qty 15
  Filled 2019-02-23: qty 20
  Filled 2019-02-23 (×6): qty 15
  Filled 2019-02-23: qty 20
  Filled 2019-02-23 (×2): qty 15
  Filled 2019-02-23: qty 20
  Filled 2019-02-23 (×3): qty 15
  Filled 2019-02-23 (×3): qty 20
  Filled 2019-02-23 (×4): qty 15

## 2019-02-23 MED ORDER — ROCURONIUM BROMIDE 50 MG/5ML IV SOLN
INTRAVENOUS | Status: AC | PRN
  Administered 2019-02-23: 100 mg via INTRAVENOUS

## 2019-02-23 MED ORDER — THIAMINE HCL 100 MG/ML IJ SOLN
100.0000 mg | Freq: Every day | INTRAMUSCULAR | Status: DC
  Administered 2019-02-23 – 2019-02-26 (×4): 100 mg via INTRAVENOUS
  Filled 2019-02-23 (×4): qty 2

## 2019-02-23 MED ORDER — LACOSAMIDE 50 MG PO TABS
50.0000 mg | ORAL_TABLET | Freq: Two times a day (BID) | ORAL | Status: DC
  Administered 2019-02-23 – 2019-02-24 (×2): 50 mg
  Filled 2019-02-23 (×2): qty 1

## 2019-02-23 MED ORDER — FOLIC ACID 5 MG/ML IJ SOLN
1.0000 mg | Freq: Every day | INTRAMUSCULAR | Status: DC
  Administered 2019-02-23 – 2019-02-26 (×4): 1 mg via INTRAVENOUS
  Filled 2019-02-23 (×4): qty 0.2

## 2019-02-23 MED ORDER — SODIUM CHLORIDE 0.9 % IV SOLN
INTRAVENOUS | Status: AC | PRN
  Administered 2019-02-23: 1000 mL via INTRAVENOUS

## 2019-02-23 MED ORDER — SODIUM CHLORIDE 0.9 % IV SOLN: INTRAVENOUS | Status: AC

## 2019-02-23 MED ORDER — DOCUSATE SODIUM 50 MG/5ML PO LIQD
100.0000 mg | Freq: Two times a day (BID) | ORAL | Status: DC | PRN
  Filled 2019-02-23: qty 10

## 2019-02-23 NOTE — Progress Notes (Signed)
eLink Physician-Brief Progress Note Patient Name: Lee [name] DOB: 05/23/1950 MRN: [id number]   Date of Service  02/23/2019  HPI/Events of Note  RN called for fever of 101.3 and no Tylenol. LFT's are WNL  eICU Interventions  Tylenol 500 mg  Q8 prn fever 101.5 ordered   Discussed with RN.  Intervention Category Minor Interventions: Other:  Bevelyn Ngo 02/23/2019, 10:38 PM

## 2019-02-23 NOTE — ED Triage Notes (Signed)
Per [facility] pt coming from [facility], staff reports patient choked on a piece of meat at lunch then when they checked patient was having difficulty breathing. Patient has rhonchi in all fields and on nonrebreather on arrival.

## 2019-02-23 NOTE — ED Notes (Signed)
Patient is now at a dose of 62mcg/kg/min of Propofol and is comfortable with eyes closed. Blood pressure is still within normal limits with a systolic of 143.

## 2019-02-23 NOTE — ED Notes (Signed)
Patient opened his eyes and started to lean forward. The patient's pulse became elevated as well as the blood pressure. The dose of Propofol was titrated up per order protocol.
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
## 2019-02-23 NOTE — Progress Notes (Signed)
Pharmacy Antibiotic Note  Lee [name] is a 70 y.o. male admitted on 02/23/2019 from [facility] with respiratory distress with concern for pneumonia.  Pharmacy has been consulted for vancomycin and cefepime dosing.  Scr today 0.61. WBC elevated at 15.1. Lactate elevated at 2.4. Patient is afebrile.   Plan: - Vancomycin loading dose 1g IV x1  - Vancomycin maintenance dose 1250 mg IV q24hr. Scr used 0.8 (rounded up for low Scr of 0.61). Est AUC 454. Goal AUC 400-550.  - Cefepime 2g IV q8hr - Monitor renal function, c/s, clinical improvement - F/u MRSA PCR for vancomycin deescalation  Height: 5\' 6"  (167.6 cm) IBW/kg (Calculated) : 63.8  Temp (24hrs), Avg:97.7 F (36.5 C), Min:97.7 F (36.5 C), Max:97.7 F (36.5 C)  No results for input(s): WBC, CREATININE, LATICACIDVEN, VANCOTROUGH, VANCOPEAK, VANCORANDOM, GENTTROUGH, GENTPEAK, GENTRANDOM, TOBRATROUGH, TOBRAPEAK, TOBRARND, AMIKACINPEAK, AMIKACINTROU, AMIKACIN in the last 168 hours.  CrCl cannot be calculated (Patient's most recent lab result is older than the maximum 21 days allowed.).    Allergies  Allergen Reactions  . Orange Juice [Orange Oil] Diarrhea  . Penicillins Nausea And Vomiting    Tolerates Zosyn. "might have been an overdose" (01/22/2012) Has patient had a PCN reaction causing immediate rash, facial/tongue/throat swelling, SOB or lightheadedness with hypotension: yes Has patient had a PCN reaction causing severe rash involving mucus membranes or skin necrosis: unknown Has patient had a PCN reaction that required hospitalization: no Has patient had a PCN reaction occurring within the last 10 years: no If all of the above answers are "NO", then may proceed with Cephalosporin u  . Vicodin [Hydrocodone-Acetaminophen] Other (See Comments)    "triggered seizure both here and at home when he tried to take it" (01/22/2012)  . Oxycodone     Causes Seizures    Antimicrobials this admission: Vancomycin 2/15 >>  Cefepime  2/15 >>   Dose adjustments this admission:  Microbiology results: 2/15 BCx: sent 2/15 UCx: sent  2/15 MRSA PCR: sent  Thank you for allowing pharmacy to be a part of this patient's care.  3/15, PharmD PGY1 Pharmacy Resident

## 2019-02-23 NOTE — Progress Notes (Signed)
RT NOTES: Critical ABG results given to Dr Jacqulyn Bath. Respiratory rate increased to 26 and fio2 decreased to 60%. Will continue to monitor.
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
## 2019-02-24 ENCOUNTER — Inpatient Hospital Stay (HOSPITAL_COMMUNITY): Payer: Medicare Other

## 2019-02-24 DIAGNOSIS — I739 Peripheral vascular disease, unspecified: Secondary | ICD-10-CM

## 2019-02-24 DIAGNOSIS — J449 Chronic obstructive pulmonary disease, unspecified: Secondary | ICD-10-CM

## 2019-02-24 DIAGNOSIS — J181 Lobar pneumonia, unspecified organism: Secondary | ICD-10-CM

## 2019-02-24 LAB — BASIC METABOLIC PANEL
Anion gap: 14 (ref 5–15)
Anion gap: 15 (ref 5–15)
BUN: 7 mg/dL — ABNORMAL LOW (ref 8–23)
BUN: 9 mg/dL (ref 8–23)
CO2: 24 mmol/L (ref 22–32)
CO2: 25 mmol/L (ref 22–32)
Calcium: 9 mg/dL (ref 8.9–10.3)
Calcium: 8.4 mg/dL — ABNORMAL LOW (ref 8.9–10.3)
Chloride: 105 mmol/L (ref 98–111)
Chloride: 104 mmol/L (ref 98–111)
Creatinine, Ser: 0.84 mg/dL (ref 0.61–1.24)
Creatinine, Ser: 0.84 mg/dL (ref 0.61–1.24)
GFR calc Af Amer: >60 mL/min (ref >60)
GFR calc Af Amer: >60 mL/min (ref >60)
GFR calc non Af Amer: >60 mL/min (ref >60)
GFR calc non Af Amer: >60 mL/min (ref >60)
Glucose, Bld: 108 mg/dL — ABNORMAL HIGH (ref 70–99)
Glucose, Bld: 102 mg/dL — ABNORMAL HIGH (ref 70–99)
Potassium: 3.6 mmol/L (ref 3.5–5.1)
Potassium: 5.3 mmol/L — ABNORMAL HIGH (ref 3.5–5.1)
Sodium: 143 mmol/L (ref 135–145)
Sodium: 144 mmol/L (ref 135–145)

## 2019-02-24 LAB — POCT I-STAT 7, (LYTES, BLD GAS, ICA,H+H)
Acid-Base Excess: 2 mmol/L (ref 0.0–2.0)
Bicarbonate: 25.4 mmol/L (ref 20.0–28.0)
Calcium, Ion: 1.17 mmol/L (ref 1.15–1.40)
HCT: 41 % (ref 39.0–52.0)
Hemoglobin: 13.9 g/dL (ref 13.0–17.0)
O2 Saturation: 97 %
Patient temperature: 98
Potassium: 3.2 mmol/L — ABNORMAL LOW (ref 3.5–5.1)
Sodium: 144 mmol/L (ref 135–145)
TCO2: 26 mmol/L (ref 22–32)
pCO2 arterial: 33.2 mmHg (ref 32.0–48.0)
pH, Arterial: 7.491 — ABNORMAL HIGH (ref 7.350–7.450)
pO2, Arterial: 85 mmHg (ref 83.0–108.0)

## 2019-02-24 LAB — CBC
HCT: 44.8 % (ref 39.0–52.0)
Hemoglobin: 14.7 g/dL (ref 13.0–17.0)
MCH: 32.4 pg (ref 26.0–34.0)
MCHC: 32.8 g/dL (ref 30.0–36.0)
MCV: 98.7 fL (ref 80.0–100.0)
Platelets: 282 10*3/uL (ref 150–400)
RBC: 4.54 MIL/uL (ref 4.22–5.81)
RDW: 14.6 % (ref 11.5–15.5)
WBC: 11.6 10*3/uL — ABNORMAL HIGH (ref 4.0–10.5)
nRBC: 0 % (ref 0.0–0.2)

## 2019-02-24 LAB — MAGNESIUM: Magnesium: 1.6 mg/dL — ABNORMAL LOW (ref 1.7–2.4)

## 2019-02-24 LAB — PHOSPHORUS: Phosphorus: 2.1 mg/dL — ABNORMAL LOW (ref 2.5–4.6)

## 2019-02-24 MED ORDER — POTASSIUM PHOSPHATES 15 MMOLE/5ML IV SOLN
20.0000 mmol | Freq: Once | INTRAVENOUS | Status: AC
  Administered 2019-02-24: 11:00:00 20 mmol via INTRAVENOUS
  Filled 2019-02-24: qty 6.67

## 2019-02-24 MED ORDER — POTASSIUM CHLORIDE 20 MEQ/15ML (10%) PO SOLN
40.0000 meq | Freq: Once | ORAL | Status: AC
  Administered 2019-02-24: 10:00:00 40 meq
  Filled 2019-02-24: qty 30

## 2019-02-24 MED ORDER — MAGNESIUM SULFATE 4 GM/100ML IV SOLN
4.0000 g | Freq: Once | INTRAVENOUS | Status: AC
  Administered 2019-02-24: 4 g via INTRAVENOUS
  Filled 2019-02-24: qty 100

## 2019-02-24 MED ORDER — POTASSIUM PHOSPHATES 15 MMOLE/5ML IV SOLN
20.0000 mmol | Freq: Once | INTRAVENOUS | Status: DC
  Filled 2019-02-24: qty 6.67

## 2019-02-24 MED ORDER — LACOSAMIDE 50 MG PO TABS
300.0000 mg | ORAL_TABLET | Freq: Two times a day (BID) | ORAL | Status: DC
  Administered 2019-02-24 – 2019-03-06 (×19): 300 mg
  Filled 2019-02-24 (×19): qty 6
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
## 2019-02-25 ENCOUNTER — Encounter (HOSPITAL_COMMUNITY): Payer: Medicare Other

## 2019-02-25 ENCOUNTER — Inpatient Hospital Stay (HOSPITAL_COMMUNITY): Payer: Medicare Other

## 2019-02-25 DIAGNOSIS — I739 Peripheral vascular disease, unspecified: Secondary | ICD-10-CM

## 2019-02-25 LAB — CBC WITH DIFFERENTIAL/PLATELET
Abs Immature Granulocytes: 0.03 10*3/uL (ref 0.00–0.07)
Basophils Absolute: 0 10*3/uL (ref 0.0–0.1)
Basophils Relative: 0 %
Eosinophils Absolute: 0.1 10*3/uL (ref 0.0–0.5)
Eosinophils Relative: 1 %
HCT: 39.6 % (ref 39.0–52.0)
Hemoglobin: 13.3 g/dL (ref 13.0–17.0)
Immature Granulocytes: 0 %
Lymphocytes Relative: 24 %
Lymphs Abs: 2.2 10*3/uL (ref 0.7–4.0)
MCH: 33.3 pg (ref 26.0–34.0)
MCHC: 33.6 g/dL (ref 30.0–36.0)
MCV: 99 fL (ref 80.0–100.0)
Monocytes Absolute: 0.6 10*3/uL (ref 0.1–1.0)
Monocytes Relative: 6 %
Neutro Abs: 6.2 10*3/uL (ref 1.7–7.7)
Neutrophils Relative %: 69 %
Platelets: 266 10*3/uL (ref 150–400)
RBC: 4 MIL/uL — ABNORMAL LOW (ref 4.22–5.81)
RDW: 15 % (ref 11.5–15.5)
WBC: 9.2 10*3/uL (ref 4.0–10.5)
nRBC: 0 % (ref 0.0–0.2)

## 2019-02-25 LAB — BASIC METABOLIC PANEL
Anion gap: 12 (ref 5–15)
BUN: 8 mg/dL (ref 8–23)
CO2: 25 mmol/L (ref 22–32)
Calcium: 8.5 mg/dL — ABNORMAL LOW (ref 8.9–10.3)
Chloride: 105 mmol/L (ref 98–111)
Creatinine, Ser: 0.8 mg/dL (ref 0.61–1.24)
GFR calc Af Amer: >60 mL/min (ref >60)
GFR calc non Af Amer: >60 mL/min (ref >60)
Glucose, Bld: 94 mg/dL (ref 70–99)
Potassium: 3.7 mmol/L (ref 3.5–5.1)
Sodium: 142 mmol/L (ref 135–145)

## 2019-02-25 LAB — VITAMIN B12: Vitamin B-12: 935 pg/mL — ABNORMAL HIGH (ref 180–914)

## 2019-02-25 LAB — GLUCOSE, CAPILLARY: Glucose-Capillary: 124 mg/dL — ABNORMAL HIGH (ref 70–99)

## 2019-02-25 LAB — MAGNESIUM: Magnesium: 1.9 mg/dL (ref 1.7–2.4)

## 2019-02-25 MED ORDER — AMLODIPINE BESYLATE 10 MG PO TABS
10.0000 mg | ORAL_TABLET | Freq: Every day | ORAL | Status: DC
  Administered 2019-02-25 – 2019-02-26 (×2): 10 mg via ORAL
  Filled 2019-02-25 (×2): qty 1

## 2019-02-25 MED ORDER — SODIUM CHLORIDE 0.9 % IV SOLN
1.0000 g | Freq: Three times a day (TID) | INTRAVENOUS | Status: DC
  Administered 2019-02-25 – 2019-02-26 (×3): 1 g via INTRAVENOUS
  Filled 2019-02-25 (×5): qty 1

## 2019-02-25 NOTE — Progress Notes (Signed)
CSW spoke with patient's wife [name] at [phone] to obtain updated contact and POA information. [name] reports patient does not have a POA of any kind. [name] reports the patient came to [facility] from [facility]. [name] reports the patient is a full code. [name] desires to remain the primary contact for this patient.  Edwin Dada, MSW, LCSW-A Transitions of Care  Clinical Social Worker  [facility] Emergency Departments  Medical ICU [phone]

## 2019-02-25 NOTE — Progress Notes (Signed)
ABI's have been completed. Preliminary results can be found in CV Proc through chart review.   02/25/19 2:45 PM Olen Cordial RVT

## 2019-02-25 NOTE — Procedures (Signed)
Extubation Procedure Note  Patient Details:   Name: Lee [name] DOB: November 29, 1950 MRN: [id number]   Airway Documentation:    Vent end date: 02/25/19 Vent end time: 1145   Evaluation  O2 sats: stable throughout Complications: No apparent complications Patient did tolerate procedure well. Bilateral Breath Sounds: Diminished   No   Pt was extubated at 1145 per MD order to 4L [place] with saturations of 100% at this time. Pt was suctioned and had a positive cuff leak prior to extubation. Pt was not able to speak afterwards but had a strong cough, MD made aware. RT heard no stridor afterwards. RT will continue to monitor.    Collie Wernick A Kosisochukwu Goldberg 02/25/2019, 11:53 AM
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
## 2019-02-25 NOTE — Progress Notes (Signed)
NAME:  Lee [name], MRN:  [id number], DOB:  Apr 04, 1950, LOS: 2 ADMISSION DATE:  02/23/2019, CONSULTATION DATE:  02/22/2018 REFERRING MD:  Dr Laverta Baltimore CHIEF COMPLAINT: Acute hypoxic respiratory failure  Brief History   69 year old male presented from local [place] to emergency department by EMS with respiratory distress and hypoxemia.  Per her report this appears to be aspiration event, resulting in intubation upon arrival.  PCCM consulted for admission  History of present illness   Lee [name] is a 69 year old male with a past medical history significant for seizures, stroke, COPD, dementia, and alcohol abuse who presented to the emergency department from local skilled nursing facility, [facility], in acute respiratory distress with hypoxemia.  Per nursing staff at skilled nursing facility patient was found in respiratory distress with gurgling sounds and tachypnea.  Patient was last seen well during consumption of meal tray on reevaluation patient was found in respiratory distress.  Per report upon arrival EMS found patient to be very hypoxemic for which he was placed on nonrebreather.  Due to significant respiratory distress patient was intubated immediately on arrival to ED.  Full CODE STATUS confirmed with patient's wife.  Vital signs on admission significant for tachypnea, tachycardia, and severe hypertension.  Lab work significant for hypernatremia, hypokalemia, hyperchloremia, leukocytosis, and lactic acidosis.  Chest x-ray with findings worrisome for pneumonia  Past Medical History   Past Medical History:  Diagnosis Date  . Aneurysm of femoral artery ([place])   . Arthritis    "anwhere I've been hurt before" (01/22/2012)  . COPD (chronic obstructive pulmonary disease) ([place])   . Dementia ([place])   . ETOH abuse   . Gout   . Grand mal epilepsy, controlled ([place]) 1980's   last on 10/10/14  . Hypertension   . Kidney stone   . Peripheral vascular disease ([place])   . Seizures ([place])     last grand mal seizures Aug 2018  . Stroke [facility]) Sept. 2012   denies residual (01/22/2012)   Significant Hospital Events   Admitted 2/15  Consults:    Procedures:  None  Significant Diagnostic Tests:  CXR 2/15. Bibasilar airspace disease worrisome for pneumonia. No progression from 2/15-reviewed by myself Micro Data:  Tracheal aspirate gram-positive cocci in clusters  Antimicrobials:  Vancomycin 2/15 Cefepime 2/15  Interim history/subjective:  Spiked low grade fever overnight again. Grimaces but not following commands, on propofol  Objective   Blood pressure (!) 163/86, pulse 72, temperature 99.7 F (37.6 C), resp. rate (!) 26, height 5\' 10"  (1.778 m), weight 58.8 kg, SpO2 100 %.    Vent Mode: PRVC FiO2 (%):  [30 %] 30 % Set Rate:  [26 bmp] 26 bmp Vt Set:  [510 mL] 510 mL PEEP:  [5 cmH20] 5 cmH20 Plateau Pressure:  [18 cmH20-20 cmH20] 20 cmH20   Intake/Output Summary (Last 24 hours) at 02/25/2019 1023 Last data filed at 02/25/2019 0600 Gross per 24 hour  Intake 1893.31 ml  Output 765 ml  Net 1128.31 ml   Filed Weights   02/23/19 2010 02/24/19 0500 02/25/19 0226  Weight: 56.1 kg 56.1 kg 58.8 kg    Examination: General: Elderly, chronically ill-appearing  HENT: Dry oral mucosa, endotracheal tube in place Lungs: Decreased air movement bilaterally, no wheezing Cardiovascular: RRR, Ext warm Abdomen: Soft,+BS Extremities: No clubbing, no edema Neuro: winces to pain and withdaws Skin: no rashes  Resolved Hospital Problem list     Assessment & Plan:  Acute hypoxemic respiratory failure Wean to extubate hopefully  Pneumonia  Continue vancomycin and cefepime De-escalate with culture data Hx of ESBL and E coli in urine + recurrent fever - switch to meropenem  Hypertension Re evaluate once off vent  History of seizures Continue PTA meds  COPD  Bronchodilators  History of EtOH abuse -Thiamine, folate, supportive care  Dementia Supportive care   Best practice:  Diet: N.p.o. Pain/Anxiety/Delirium protocol (if indicated): wean VAP protocol (if indicated): In place DVT prophylaxis: SCD GI prophylaxis: Protonix Glucose control: Monitor Mobility: Bedrest Code Status: Full code Family Communication: updated at time of evaluation Disposition: ICU   The patient is critically ill with multiple organ systems failure and requires high complexity decision making for assessment and support, frequent evaluation and titration of therapies, application of advanced monitoring technologies and extensive interpretation of multiple databases. Critical Care Time devoted to patient care services described in this note independent of APP/resident time (if applicable)  is 32 minutes.   Myrla Halsted MD [facility] 02/25/2019 11:17 AM Personal pager: [phone] If unanswered, please page CCM On-call: [phone]

## 2019-02-25 NOTE — Progress Notes (Signed)
VASCULAR SURGERY ASSESSMENT & PLAN:   BILATERAL INFRAINGUINAL ARTERIAL OCCLUSIVE DISEASE: Both feet appear adequately perfused.  I have ordered ABIs which are pending.  Vascular surgery is following peripherally.  Given his markedly debilitated state at this point I would not recommend an aggressive work-up.   SUBJECTIVE:   Sedated on vent.  PHYSICAL EXAM:   Vitals:   02/25/19 0324 02/25/19 0400 02/25/19 0500 02/25/19 0600  BP: (!) 146/82 (!) 149/84 101/65 (!) 167/83  Pulse:  78 79 78  Resp: (!) 26 (!) 26 (!) 26 (!) 23  Temp:  99.5 F (37.5 C) 99.7 F (37.6 C) 99.1 F (37.3 C)  TempSrc:  Bladder    SpO2: 100% 100% 100% 100%  Weight:      Height:       Both feet appear adequately perfused.  Monophasic dorsalis pedis signals bilaterally.  LABS:   Lab Results  Component Value Date   WBC 11.6 (H) 02/24/2019   HGB 13.9 02/24/2019   HCT 41.0 02/24/2019   MCV 98.7 02/24/2019   PLT 282 02/24/2019   Lab Results  Component Value Date   CREATININE 0.84 02/24/2019   Lab Results  Component Value Date   INR 1.1 06/30/2018   CBG (last 3)  No results for input(s): GLUCAP in the last 72 hours.  PROBLEM LIST:    Active Problems:   Acute respiratory failure (HCC)   CURRENT MEDS:   . carbamazepine  300 mg Per Tube BID  . chlorhexidine gluconate (MEDLINE KIT)  15 mL Mouth Rinse BID  . Chlorhexidine Gluconate Cloth  6 each Topical Q0600  . clopidogrel  75 mg Per Tube Daily  . folic acid  1 mg Intravenous Daily  . heparin  5,000 Units Subcutaneous Q8H  . lacosamide  300 mg Per Tube BID  . levETIRAcetam  1,500 mg Per Tube BID  . mouth rinse  15 mL Mouth Rinse 10 times per day  . pantoprazole (PROTONIX) IV  40 mg Intravenous QHS  . thiamine  100 mg Intravenous Daily  . valproic acid  750 mg Per Tube Q8H      Office: [phone] 02/25/2019  

## 2019-02-26 ENCOUNTER — Inpatient Hospital Stay (HOSPITAL_COMMUNITY): Payer: Medicare Other

## 2019-02-26 DIAGNOSIS — R569 Unspecified convulsions: Secondary | ICD-10-CM

## 2019-02-26 LAB — BASIC METABOLIC PANEL
Anion gap: 9 (ref 5–15)
BUN: 7 mg/dL — ABNORMAL LOW (ref 8–23)
CO2: 28 mmol/L (ref 22–32)
Calcium: 8.8 mg/dL — ABNORMAL LOW (ref 8.9–10.3)
Chloride: 107 mmol/L (ref 98–111)
Creatinine, Ser: 0.73 mg/dL (ref 0.61–1.24)
GFR calc Af Amer: >60 mL/min (ref >60)
GFR calc non Af Amer: >60 mL/min (ref >60)
Glucose, Bld: 104 mg/dL — ABNORMAL HIGH (ref 70–99)
Potassium: 3.9 mmol/L (ref 3.5–5.1)
Sodium: 144 mmol/L (ref 135–145)

## 2019-02-26 LAB — PHOSPHORUS
Phosphorus: 2.5 mg/dL (ref 2.5–4.6)
Phosphorus: 2.1 mg/dL — ABNORMAL LOW (ref 2.5–4.6)

## 2019-02-26 LAB — CULTURE, RESPIRATORY W GRAM STAIN: Culture: NORMAL

## 2019-02-26 LAB — CBC WITH DIFFERENTIAL/PLATELET
Abs Immature Granulocytes: 0.04 10*3/uL (ref 0.00–0.07)
Basophils Absolute: 0 10*3/uL (ref 0.0–0.1)
Basophils Relative: 0 %
Eosinophils Absolute: 0 10*3/uL (ref 0.0–0.5)
Eosinophils Relative: 0 %
HCT: 41.9 % (ref 39.0–52.0)
Hemoglobin: 13.6 g/dL (ref 13.0–17.0)
Immature Granulocytes: 1 %
Lymphocytes Relative: 13 %
Lymphs Abs: 1.1 10*3/uL (ref 0.7–4.0)
MCH: 32.3 pg (ref 26.0–34.0)
MCHC: 32.5 g/dL (ref 30.0–36.0)
MCV: 99.5 fL (ref 80.0–100.0)
Monocytes Absolute: 0.7 10*3/uL (ref 0.1–1.0)
Monocytes Relative: 8 %
Neutro Abs: 6.9 10*3/uL (ref 1.7–7.7)
Neutrophils Relative %: 78 %
Platelets: 276 10*3/uL (ref 150–400)
RBC: 4.21 MIL/uL — ABNORMAL LOW (ref 4.22–5.81)
RDW: 14.9 % (ref 11.5–15.5)
WBC: 8.8 10*3/uL (ref 4.0–10.5)
nRBC: 0 % (ref 0.0–0.2)

## 2019-02-26 LAB — GLUCOSE, CAPILLARY
Glucose-Capillary: 104 mg/dL — ABNORMAL HIGH (ref 70–99)
Glucose-Capillary: 109 mg/dL — ABNORMAL HIGH (ref 70–99)
Glucose-Capillary: 112 mg/dL — ABNORMAL HIGH (ref 70–99)
Glucose-Capillary: 85 mg/dL (ref 70–99)
Glucose-Capillary: 87 mg/dL (ref 70–99)
Glucose-Capillary: 95 mg/dL (ref 70–99)

## 2019-02-26 LAB — URINE CULTURE: Culture: >=100000 — AB

## 2019-02-26 LAB — MAGNESIUM
Magnesium: 1.8 mg/dL (ref 1.7–2.4)
Magnesium: 1.8 mg/dL (ref 1.7–2.4)

## 2019-02-26 MED ORDER — PIPERACILLIN-TAZOBACTAM 3.375 G IVPB
3.3750 g | Freq: Three times a day (TID) | INTRAVENOUS | Status: AC
  Administered 2019-02-26 – 2019-03-04 (×19): 3.375 g via INTRAVENOUS
  Filled 2019-02-26 (×20): qty 50

## 2019-02-26 MED ORDER — AMLODIPINE BESYLATE 10 MG PO TABS
10.0000 mg | ORAL_TABLET | Freq: Every day | ORAL | Status: DC
  Administered 2019-02-27 – 2019-03-06 (×8): 10 mg
  Filled 2019-02-26 (×8): qty 1

## 2019-02-26 MED ORDER — SODIUM CHLORIDE 0.9 % IV SOLN
INTRAVENOUS | Status: DC | PRN
  Administered 2019-02-26: 11:00:00 1000 mL via INTRAVENOUS

## 2019-02-26 MED ORDER — OSMOLITE 1.2 CAL PO LIQD
1000.0000 mL | ORAL | Status: DC
  Administered 2019-02-27 – 2019-03-01 (×3): 1000 mL
  Filled 2019-02-26 (×11): qty 1000

## 2019-02-26 MED ORDER — OSMOLITE 1.2 CAL PO LIQD
1000.0000 mL | ORAL | Status: DC
  Administered 2019-02-26: 11:00:00 1000 mL
  Filled 2019-02-26: qty 1000

## 2019-02-26 MED ORDER — ALBUTEROL SULFATE (2.5 MG/3ML) 0.083% IN NEBU: 2.5000 mg | INHALATION_SOLUTION | RESPIRATORY_TRACT | Status: DC | PRN

## 2019-02-26 MED ORDER — FOLIC ACID 1 MG PO TABS
1.0000 mg | ORAL_TABLET | Freq: Every day | ORAL | Status: DC
  Administered 2019-02-27 – 2019-03-06 (×8): 1 mg
  Filled 2019-02-26 (×8): qty 1

## 2019-02-26 MED ORDER — THIAMINE HCL 100 MG PO TABS
100.0000 mg | ORAL_TABLET | Freq: Every day | ORAL | Status: DC
  Administered 2019-02-27 – 2019-03-06 (×8): 100 mg
  Filled 2019-02-26 (×8): qty 1

## 2019-02-26 MED ORDER — SIMVASTATIN 20 MG PO TABS
10.0000 mg | ORAL_TABLET | Freq: Every day | ORAL | Status: DC
  Administered 2019-02-26 – 2019-03-05 (×7): 10 mg
  Filled 2019-02-26 (×9): qty 1
  Filled 2019-02-26: qty 2
  Filled 2019-02-26: qty 1

## 2019-02-26 MED ORDER — LORAZEPAM 2 MG/ML IJ SOLN
1.0000 mg | Freq: Once | INTRAMUSCULAR | Status: AC
  Administered 2019-02-26: 11:00:00 1 mg via INTRAVENOUS
  Filled 2019-02-26: qty 1

## 2019-02-26 MED ORDER — CLONAZEPAM 0.5 MG PO TABS: 0.2500 mg | ORAL_TABLET | Freq: Two times a day (BID) | ORAL | Status: DC

## 2019-02-26 MED ORDER — CLONAZEPAM 0.125 MG PO TBDP
0.2500 mg | ORAL_TABLET | Freq: Two times a day (BID) | ORAL | Status: DC
  Administered 2019-02-26 – 2019-03-06 (×16): 0.25 mg
  Filled 2019-02-26 (×8): qty 2
  Filled 2019-02-26: qty 1
  Filled 2019-02-26 (×7): qty 2
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
## 2019-02-26 NOTE — Progress Notes (Signed)
Received report from [place], [place]. Patient arrived to unit. Disoriented, Not responsive to verbal stimulus. Safety mittens , and SCD's applied. Bed in low and locked position. Repositioned. Mouth swabbed and suctioned. Telemetry applied. Call bell within patient reach.

## 2019-02-26 NOTE — Plan of Care (Signed)
  Problem: Education: Goal: Knowledge of General Education information will improve Description: Including pain rating scale, medication(s)/side effects and non-pharmacologic comfort measures 02/26/2019 2045 by Rema Fendt, RN Outcome: Progressing 02/26/2019 2045 by Rema Fendt, RN Outcome: Progressing

## 2019-02-26 NOTE — Progress Notes (Signed)
EEG complete - results pending 

## 2019-02-26 NOTE — Progress Notes (Signed)
   VASCULAR SURGERY ASSESSMENT & PLAN:   BILATERAL INFRAINGUINAL ARTERIAL OCCLUSIVE DISEASE: He is feet are adequately perfused with monophasic DP signals bilaterally.  His aortobifemoral bypass graft is patent.  Nothing further to add at this point from a vascular standpoint I will arrange follow-up with Dr. Trula Slade in 2 months.  SUBJECTIVE:   Extubated but noncommunicative.  PHYSICAL EXAM:   Vitals:   02/26/19 0400 02/26/19 0500 02/26/19 0600 02/26/19 0700  BP: (!) 150/89 (!) 162/94 (!) 145/97 (!) 151/78  Pulse: 69 72 79 77  Resp: (!) 25 (!) 28 (!) 25 (!) 26  Temp: 99.5 F (37.5 C) 100 F (37.8 C) (!) 101.5 F (38.6 C) (!) 101.3 F (38.5 C)  TempSrc: Bladder     SpO2: 100% 100% 100% 100%  Weight:  55.3 kg    Height:       Palpable femoral pulses. Monophasic dorsalis pedis signal bilaterally.  LABS:   Lab Results  Component Value Date   WBC 8.8 02/26/2019   HGB 13.6 02/26/2019   HCT 41.9 02/26/2019   MCV 99.5 02/26/2019   PLT 276 02/26/2019   Lab Results  Component Value Date   CREATININE 0.73 02/26/2019   Lab Results  Component Value Date   INR 1.1 06/30/2018   CBG (last 3)  Recent Labs    02/25/19 2335 02/26/19 0328 02/26/19 0812  GLUCAP 124* 104* 85    PROBLEM LIST:    Active Problems:   Acute respiratory failure (HCC)   CURRENT MEDS:   . amLODipine  10 mg Oral Daily  . carbamazepine  300 mg Per Tube BID  . chlorhexidine gluconate (MEDLINE KIT)  15 mL Mouth Rinse BID  . Chlorhexidine Gluconate Cloth  6 each Topical Q0600  . clopidogrel  75 mg Per Tube Daily  . folic acid  1 mg Intravenous Daily  . heparin  5,000 Units Subcutaneous Q8H  . lacosamide  300 mg Per Tube BID  . levETIRAcetam  1,500 mg Per Tube BID  . mouth rinse  15 mL Mouth Rinse 10 times per day  . pantoprazole (PROTONIX) IV  40 mg Intravenous QHS  . thiamine  100 mg Intravenous Daily  . valproic acid  750 mg Per Tube Q8H    Deitra Mayo Office:  [phone] 02/26/2019

## 2019-02-26 NOTE — Plan of Care (Signed)
  Problem: Education: Goal: Knowledge of General Education information will improve Description Including pain rating scale, medication(s)/side effects and non-pharmacologic comfort measures Outcome: Progressing   

## 2019-02-26 NOTE — Progress Notes (Signed)
Progress Note    02/26/2019  9:29 AM * No surgery found *  Subjective: Follow-up consultation for physical exam findings of left cool foot.  Patient with history of previous aortobifemoral bypass graft.  He is now extubated, not oriented and does not follow commands.   Vitals:   02/26/19 0600 02/26/19 0700  BP: (!) 145/97 (!) 151/78  Pulse: 79 77  Resp: (!) 25 (!) 26  Temp: (!) 101.5 F (38.6 C) (!) 101.3 F (38.5 C)  SpO2: 100% 100%    Physical Exam: Cardiac: Rate rhythm regular Lungs: Nonlabored Extremities: Cool skin tempature of left foot has resolved.  Both feet are warm.  Brisk dopplerable dorsalis pedis pulses bilaterally.  He is moving bilateral lower extremities and wiggling toes spontaneously.  CBC    Component Value Date/Time   WBC 8.8 02/26/2019 0332   RBC 4.21 (L) 02/26/2019 0332   HGB 13.6 02/26/2019 0332   HGB 14.9 07/31/2017 1503   HCT 41.9 02/26/2019 0332   HCT 44.6 07/31/2017 1503   PLT 276 02/26/2019 0332   PLT 383 07/31/2017 1503   MCV 99.5 02/26/2019 0332   MCV 96 07/31/2017 1503   MCH 32.3 02/26/2019 0332   MCHC 32.5 02/26/2019 0332   RDW 14.9 02/26/2019 0332   RDW 14.5 07/31/2017 1503   LYMPHSABS 1.1 02/26/2019 0332   LYMPHSABS 2.5 07/31/2017 1503   MONOABS 0.7 02/26/2019 0332   EOSABS 0.0 02/26/2019 0332   EOSABS 0.2 07/31/2017 1503   BASOSABS 0.0 02/26/2019 0332   BASOSABS 0.0 07/31/2017 1503    BMET    Component Value Date/Time   NA 144 02/26/2019 0332   NA 145 (H) 07/31/2017 1503   K 3.9 02/26/2019 0332   CL 107 02/26/2019 0332   CO2 28 02/26/2019 0332   GLUCOSE 104 (H) 02/26/2019 0332   BUN 7 (L) 02/26/2019 0332   BUN 9 07/31/2017 1503   CREATININE 0.73 02/26/2019 0332   CALCIUM 8.8 (L) 02/26/2019 0332   GFRNONAA >60 02/26/2019 0332   GFRAA >60 02/26/2019 0332     Intake/Output Summary (Last 24 hours) at 02/26/2019 0929 Last data filed at 02/26/2019 0600 Gross per 24 hour  Intake 532.61 ml  Output 810 ml  Net  -277.39 ml    HOSPITAL MEDICATIONS Scheduled Meds: . amLODipine  10 mg Oral Daily  . carbamazepine  300 mg Per Tube BID  . chlorhexidine gluconate (MEDLINE KIT)  15 mL Mouth Rinse BID  . Chlorhexidine Gluconate Cloth  6 each Topical Q0600  . clopidogrel  75 mg Per Tube Daily  . folic acid  1 mg Intravenous Daily  . heparin  5,000 Units Subcutaneous Q8H  . lacosamide  300 mg Per Tube BID  . levETIRAcetam  1,500 mg Per Tube BID  . mouth rinse  15 mL Mouth Rinse 10 times per day  . pantoprazole (PROTONIX) IV  40 mg Intravenous QHS  . thiamine  100 mg Intravenous Daily  . valproic acid  750 mg Per Tube Q8H   Continuous Infusions: . sodium chloride 10 mL/hr at 02/26/19 0600  . meropenem (MERREM) IV 1 g (02/26/19 0602)  . propofol (DIPRIVAN) infusion Stopped (02/25/19 1031)   PRN Meds:.sodium chloride, acetaminophen (TYLENOL) oral liquid 160 mg/5 mL, bisacodyl, docusate, labetalol, midazolam, midazolam  ABI Findings:  +--------+------------------+-----+----------+--------+  Right  Rt Pressure (mmHg)IndexWaveform Comment   +--------+------------------+-----+----------+--------+  Brachial                 IV     +--------+------------------+-----+----------+--------+  PTA   122           monophasic      +--------+------------------+-----+----------+--------+  DP   126           monophasic      +--------+------------------+-----+----------+--------+   +---------+------------------+-----+----------+----------------------------  ----+  Left   Lt Pressure (mmHg)IndexWaveform Comment               +---------+------------------+-----+----------+----------------------------  ----+  PTA                    Unable to insonate due to                            patient positioning           +---------+------------------+-----+----------+----------------------------  ----+  DP    170           monophasic                   +---------+------------------+-----+----------+----------------------------  ----+  Donnal Debar                                   +---------+------------------+-----+----------+----------------------------  ----+    Summary:  Right: Resting right ankle-brachial index indicates moderate right lower  extremity arterial disease.   Unable to obtain TBI due to patient positioning.  Left: Resting left ankle-brachial index indicates mild left lower  extremity arterial disease. The left toe-brachial index is abnormal.    Assessment:  69 y.o. male is s/p:  Admission secondary to acute respiratory failure.  Primary team noted cool skin temperature to left foot.  Vascular surgery consulted.  Technically difficult ABI exam.  Unable to perform accurate ABIs due to patient positioning and inability to cooperate. * No surgery found *  Plan: -Discussed physical exam findings with Dr. Scot Dock.  Patient without evidence today of ischemic changes to lower extremities.  Continue Plavix  Risa Grill, PA-C [facility] [phone] 02/26/2019  9:29 AM

## 2019-02-26 NOTE — Progress Notes (Addendum)
Initial Nutrition Assessment  DOCUMENTATION CODES:   Underweight  INTERVENTION:    Osmolite 1.2 begin at 20 ml/h, increase by 10 ml every 8 hours to goal rate of 65 ml/h (1560 ml per day)   Provides 1872 kcal, 87 gm protein, 1279 ml free water daily   Monitor magnesium, potassium, and phosphorus, MD to replete as needed, as pt is at risk for refeeding syndrome given underweight status, suspected malnutrition.  NUTRITION DIAGNOSIS:   Inadequate oral intake related to lethargy/confusion as evidenced by NPO status.  GOAL:   Patient will meet greater than or equal to 90% of their needs  MONITOR:   TF tolerance, Diet advancement, Labs  REASON FOR ASSESSMENT:   Consult Enteral/tube feeding initiation and management  ASSESSMENT:   68 yo male admitted with acute respiratory failure. PMH includes HTN, PVD, COPD, stroke, ETOH abuse, dementia, siezures. Noted allergy to orange juice.   Concerns for aspiration event at SNF PTA; intubated on admission;  extubated 2/17.   Vascular surgery consulted for left cool foot; no evidence of ischemic changes to LE at this time.   Patient is currently NPO. He is not alert enough to take PO's safely. NG tube in place. Received MD Consult for TF initiation and management.  Patient is underweight with BMI = 17.49. He has had 12% weight loss within the past year.   Labs reviewed. BUN 7 (L) CBG's: [phone]  Medications reviewed and include folic acid, Keppra, thiamine.  Suspect some degree of malnutrition, unable to obtain enough information at this time for identification of malnutrition.   NUTRITION - FOCUSED PHYSICAL EXAM:  unable to complete  Diet Order:   Diet Order            Diet NPO time specified  Diet effective now              EDUCATION NEEDS:   Not appropriate for education at this time  Skin:  Skin Assessment: Reviewed RN Assessment  Last BM:  2/16  Height:   Ht Readings from Last 1 Encounters:   02/23/19 5\' 10"  (1.778 m)    Weight:   Wt Readings from Last 1 Encounters:  02/26/19 55.3 kg    Ideal Body Weight:  75.5 kg  BMI:  Body mass index is 17.49 kg/m.  Estimated Nutritional Needs:   Kcal:  1750-2000  Protein:  80-95 gm  Fluid:  >/= 1.7 L    02/28/19, RD, LDN, CNSC Please refer to Amion for contact information.

## 2019-02-26 NOTE — Progress Notes (Signed)
SLP Cancellation Note  Patient Details Name: Lee [name] MRN: [id number] DOB: [birth year]   Cancelled treatment:       Reason Eval/Treat Not Completed: Patient at procedure or test/unavailable   Raunak Antuna, Riley Nearing 02/26/2019, 2:06 PM

## 2019-02-26 NOTE — Procedures (Signed)
Patient Name: Lee [name]  MRN: [id number]  Epilepsy Attending: Charlsie Quest  Referring Physician/Provider: Dr. Levon Hedger Date: 02/26/2019 Duration: 24.07 minutes  Patient history: 70 year old male with history of epilepsy presented with acute hypoxic respiratory failure.  EEG to evaluate for seizures.  Level of alertness: Comatose  AEDs during EEG study: Carbamazepine, Vimpat, Keppra, clonazepam, valproic acid  Technical aspects: This EEG study was done with scalp electrodes positioned according to the 10-20 International system of electrode placement. Electrical activity was acquired at a sampling rate of 500Hz  and reviewed with a high frequency filter of 70Hz  and a low frequency filter of 1Hz . EEG data were recorded continuously and digitally stored.   Description: EEG showed continuous generalized polymorphic 5 to 8 Hz theta-alpha activity as well as intermittent low amplitude generalized EEG suppression lasting 1 to 3 seconds.  EEG also showed frequent left frontal spikes, maximal Fp1, F3, Fz.  Hyperventilation and photic summation were not performed.  Abnormality -Spikes, left frontal -Continuous slow, generalized -Background suppression, generalized  IMPRESSION: This study showed evidence of epileptogenicity arising from left frontal region.  Additionally there is evidence of severe diffuse encephalopathy, nonspecific etiology. No seizures were seen throughout the recording.    [name] 

## 2019-02-27 LAB — CBC WITH DIFFERENTIAL/PLATELET
Abs Immature Granulocytes: 0.04 10*3/uL (ref 0.00–0.07)
Basophils Absolute: 0 10*3/uL (ref 0.0–0.1)
Basophils Relative: 0 %
Eosinophils Absolute: 0.1 10*3/uL (ref 0.0–0.5)
Eosinophils Relative: 1 %
HCT: 37.9 % — ABNORMAL LOW (ref 39.0–52.0)
Hemoglobin: 12.6 g/dL — ABNORMAL LOW (ref 13.0–17.0)
Immature Granulocytes: 1 %
Lymphocytes Relative: 22 %
Lymphs Abs: 1.8 10*3/uL (ref 0.7–4.0)
MCH: 32.2 pg (ref 26.0–34.0)
MCHC: 33.2 g/dL (ref 30.0–36.0)
MCV: 96.9 fL (ref 80.0–100.0)
Monocytes Absolute: 0.6 10*3/uL (ref 0.1–1.0)
Monocytes Relative: 8 %
Neutro Abs: 5.3 10*3/uL (ref 1.7–7.7)
Neutrophils Relative %: 68 %
Platelets: 280 10*3/uL (ref 150–400)
RBC: 3.91 MIL/uL — ABNORMAL LOW (ref 4.22–5.81)
RDW: 14.6 % (ref 11.5–15.5)
WBC: 7.8 10*3/uL (ref 4.0–10.5)
nRBC: 0 % (ref 0.0–0.2)

## 2019-02-27 LAB — BASIC METABOLIC PANEL
Anion gap: 7 (ref 5–15)
BUN: 11 mg/dL (ref 8–23)
CO2: 28 mmol/L (ref 22–32)
Calcium: 8.1 mg/dL — ABNORMAL LOW (ref 8.9–10.3)
Chloride: 107 mmol/L (ref 98–111)
Creatinine, Ser: 0.66 mg/dL (ref 0.61–1.24)
GFR calc Af Amer: >60 mL/min (ref >60)
GFR calc non Af Amer: >60 mL/min (ref >60)
Glucose, Bld: 140 mg/dL — ABNORMAL HIGH (ref 70–99)
Potassium: 3.4 mmol/L — ABNORMAL LOW (ref 3.5–5.1)
Sodium: 142 mmol/L (ref 135–145)

## 2019-02-27 LAB — GLUCOSE, CAPILLARY
Glucose-Capillary: 135 mg/dL — ABNORMAL HIGH (ref 70–99)
Glucose-Capillary: 108 mg/dL — ABNORMAL HIGH (ref 70–99)
Glucose-Capillary: 106 mg/dL — ABNORMAL HIGH (ref 70–99)
Glucose-Capillary: 119 mg/dL — ABNORMAL HIGH (ref 70–99)
Glucose-Capillary: 118 mg/dL — ABNORMAL HIGH (ref 70–99)

## 2019-02-27 LAB — MAGNESIUM
Magnesium: 1.8 mg/dL (ref 1.7–2.4)
Magnesium: 1.5 mg/dL — ABNORMAL LOW (ref 1.7–2.4)

## 2019-02-27 LAB — PHOSPHORUS
Phosphorus: 1.7 mg/dL — ABNORMAL LOW (ref 2.5–4.6)
Phosphorus: 2.8 mg/dL (ref 2.5–4.6)

## 2019-02-27 LAB — LEVETIRACETAM LEVEL: Levetiracetam Lvl: 19.5 ug/mL (ref 10.0–40.0)

## 2019-02-27 MED ORDER — POTASSIUM CHLORIDE 10 MEQ/100ML IV SOLN
10.0000 meq | INTRAVENOUS | Status: AC
  Administered 2019-02-27 (×3): 10 meq via INTRAVENOUS
  Filled 2019-02-27 (×2): qty 100

## 2019-02-27 MED ORDER — SODIUM PHOSPHATES 45 MMOLE/15ML IV SOLN
30.0000 mmol | Freq: Once | INTRAVENOUS | Status: AC
  Administered 2019-02-27: 30 mmol via INTRAVENOUS
  Filled 2019-02-27 (×2): qty 10

## 2019-02-27 MED ORDER — FREE WATER
135.0000 mL | Freq: Four times a day (QID) | Status: DC
  Administered 2019-02-27 – 2019-03-03 (×16): 135 mL

## 2019-02-27 NOTE — Progress Notes (Signed)
SLP Cancellation Note  Patient Details Name: Lee [name] MRN: [id number] DOB: 02-11-1950   Cancelled treatment:       Reason Eval/Treat Not Completed: Patient not medically ready;Fatigue/lethargy limiting ability to participate (Pt was approached for BSE but was unable to demonstrate an adequate level of alertness to participate. SLP will follow up on subsequent date unless contacted prior.)  Delrick Dehart I. Vear Clock, MS, CCC-SLP Acute Rehabilitation Services Office number [phone] Pager [phone]  Scheryl Marten 02/27/2019, 11:35 AM

## 2019-02-27 NOTE — Consult Note (Signed)
[facility] CM Inpatient Consult   02/27/2019  Lee [name] 07/28/1950 [id number]    Patient screened for high risk score for unplanned readmission score  [facility] and reviewed for Care Management services needed in the [employer] group.    Review of patient's medical record reveals patient is a resident of [facility].  Primary Care Provider is Gerald Hill, MD  Plan: No [facility] Care Management needs as patient will have his needs met at a skilled level of care currently.  Please place a [facility] Care Management consult as appropriate and for questions contact:    , RN BSN CCM [facility] Hospital Liaison  [phone] business mobile phone Toll free office [phone]  Fax number: [phone] [email or web address]         [facility] CM Inpatient Consult   02/27/2019  Lee [name] 07/28/1950 [id number]    Patient screened for high risk score for unplanned readmission score  [facility] and reviewed for Care Management services needed in the [employer] group.    Review of patient's medical record reveals patient is a resident of [facility].  Primary Care Provider is Gerald Hill, MD  Plan: No [facility] Care Management needs as patient will have his needs met at a skilled level of care currently.  Please place a [facility] Care Management consult as appropriate and for questions contact:    , RN BSN CCM [facility] Liaison  [phone] business mobile phone Toll free office [phone]  Fax number: [phone] [email or web address]       

## 2019-02-27 NOTE — Evaluation (Signed)
Physical Therapy Treatment Patient Details Name: Lee [name] MRN: [id number] DOB: 08/17/1950 Today's Date: 02/27/2019    History of Present Illness Patient is a 69 year old male admitted from [facility] due to aspiration. Patient intubated when arrived. He has resided at [facility] since 2017 due to dementia. PMH includes: dementia, seizure, CVA, COPD    PT Comments    Patient received in bed, wife present. Patient keeps eyes closed throughout session, not responsive or communicative. Moaning with movement of extremities. Patient requires total assist to perform sidelying >< sit on side of bed. Reliant on external support to maintain sitting balance.  Patient has increased tone in all extremities. He will continue to benefit from skilled PT while here to improve mobility.     Follow Up Recommendations  SNF     Equipment Recommendations  None recommended by PT    Recommendations for Other Services       Precautions / Restrictions Precautions Precautions: Fall Precaution Comments: NG tube Restrictions Weight Bearing Restrictions: No    Mobility  Bed Mobility Overal bed mobility: Needs Assistance Bed Mobility: Sidelying to Sit;Sit to Sidelying   Sidelying to sit: Total assist     Sit to sidelying: Total assist General bed mobility comments: patient requires total assist for bed mobility. Unable to maintain sitting without external support  Transfers                 General transfer comment: not attempted  Ambulation/Gait             General Gait Details: unable   Stairs             Wheelchair Mobility    Modified Rankin (Stroke Patients Only)       Balance Overall balance assessment: Needs assistance   Sitting balance-Leahy Scale: Zero Sitting balance - Comments: requires external support to maintain sitting Postural control: Posterior lean                                  Cognition Arousal/Alertness: Lethargic    Overall Cognitive Status: History of cognitive impairments - at baseline                                 General Comments: patient not verbal, does not respond to commands other than squeezing hand      Exercises      General Comments        Pertinent Vitals/Pain Pain Assessment: Faces Faces Pain Scale: Hurts little more Pain Location: with general movement, stretching Pain Descriptors / Indicators: Grimacing Pain Intervention(s): Monitored during session    Home Living Family/patient expects to be discharged to:: Skilled nursing facility               Additional Comments: Per chart review, pt is long term resident of [address]      Prior Function Level of Independence: Needs assistance  Gait / Transfers Assistance Needed: patient's wife states he was mostly W/C bound but did walk a few steps with assistance. This may have been more than a year ago. ADL's / Homemaking Assistance Needed: Requires assist with dressing/bathing, but he is able to feed himself per wife.     PT Goals (current goals can now be found in the care plan section) Acute Rehab PT Goals Patient Stated Goal: to return to SNF PT  Goal Formulation: With family Time For Goal Achievement: 03/13/19 Potential to Achieve Goals: Fair    Frequency    Min 2X/week      PT Plan      Co-evaluation PT/OT/SLP Co-Evaluation/Treatment: Yes Reason for Co-Treatment: For patient/therapist safety;To address functional/ADL transfers PT goals addressed during session: Mobility/safety with mobility        AM-PAC PT "6 Clicks" Mobility   Outcome Measure  Help needed turning from your back to your side while in a flat bed without using bedrails?: Total Help needed moving from lying on your back to sitting on the side of a flat bed without using bedrails?: Total Help needed moving to and from a bed to a chair (including a wheelchair)?: Total Help needed standing up from a chair using your  arms (e.g., wheelchair or bedside chair)?: Total Help needed to walk in hospital room?: Total Help needed climbing 3-5 steps with a railing? : Total 6 Click Score: 6    End of Session   Activity Tolerance: Patient limited by lethargy Patient left: in bed;with call bell/phone within reach;with bed alarm set;with restraints reapplied;with family/visitor present Nurse Communication: Mobility status PT Visit Diagnosis: Muscle weakness (generalized) (M62.81);Other abnormalities of gait and mobility (R26.89)     Time: 1610-9604 PT Time Calculation (min) (ACUTE ONLY): 25 min  Charges:  $Therapeutic Activity: 8-22 mins                     Lee [name], PT, GCS 02/27/19,11:58 AM

## 2019-02-27 NOTE — Evaluation (Signed)
Occupational Therapy Evaluation Patient Details Name: Lee [name] MRN: [id number] DOB: Jul 23, 1950 Today's Date: 02/27/2019    History of Present Illness Patient is a 69 year old male admitted from [place] due to aspiration. Patient intubated when arrived, extubated 02/26/19.  Chest xray shows " right lower lobe infiltrates and right middle lobe infiltrates.  Also some infiltrates noted at left lung".  EEG negative for seizure activity. He has resided at [facility] since 2017 due to dementia. PMH includes: dementia, seizure, CVA, COPD.    Clinical Impression   Patient admitted from SNF, limited by problem list below including impaired balance, decreased activity tolerance, generalized pain, impaired cognition, generalized weakness and increased tone/tightness in BUEs.  Spouse reports PTA he was able to feed and groom himself, but required assist for bathing/dressing (also reports limited engagement with pt since covid restrictions at [facility]); limited mobility with staff assist and mainly using wc.  Patient currently requires total assist for all self care, +2 total assist for bed mobility and sitting EOB.  He will benefit from continued OT services while admitted and after dc at SNF level in order to decrease burden of care and optimize return to PLOF.     Follow Up Recommendations  SNF;Supervision/Assistance - 24 hour    Equipment Recommendations  None recommended by OT    Recommendations for Other Services       Precautions / Restrictions Precautions Precautions: Fall Precaution Comments: NG tube Restrictions Weight Bearing Restrictions: No      Mobility Bed Mobility Overal bed mobility: Needs Assistance Bed Mobility: Sidelying to Sit;Sit to Sidelying   Sidelying to sit: Total assist     Sit to sidelying: Total assist General bed mobility comments: patient requires total assist for bed mobility. Unable to maintain sitting without external support  Transfers                  General transfer comment: not attempted    Balance Overall balance assessment: Needs assistance Sitting-balance support: No upper extremity supported;Feet supported Sitting balance-Leahy Scale: Zero Sitting balance - Comments: posterior lean and requires total assist from external support Postural control: Posterior lean                                 ADL either performed or assessed with clinical judgement   ADL Overall ADL's : Needs assistance/impaired                                     Functional mobility during ADLs: Total assistance;+2 for physical assistance General ADL Comments: total assist for all self care at this time      Vision   Additional Comments: unable to assess, maintains eyes closed during session and resistant to opening      Perception     Praxis      Pertinent Vitals/Pain Pain Assessment: Faces Faces Pain Scale: Hurts little more Pain Location: with general movement, stretching Pain Descriptors / Indicators: Grimacing Pain Intervention(s): Monitored during session;Repositioned     Hand Dominance Right   Extremity/Trunk Assessment Upper Extremity Assessment Upper Extremity Assessment: LUE deficits/detail;RUE deficits/detail RUE Deficits / Details: held in flexion position, able to stretch to 90 FF at shoulder but increased tone/tighness noted  LUE Deficits / Details: held in flexion position, able to stretch to 90 FF at shoulder but increased tone/tighness noted  Lower Extremity Assessment Lower Extremity Assessment: LLE deficits/detail;RLE deficits/detail RLE Deficits / Details: patient with increased tone over entire LE, stiff knees, hips and ankles       Communication Communication Communication: Expressive difficulties   Cognition Arousal/Alertness: Lethargic Behavior During Therapy: Flat affect Overall Cognitive Status: Difficult to assess                                  General Comments: no verbalizations during session, able to sqeeze hands on command only    General Comments  spouse present and supportive, asking about podiatry services due to toe nails    Exercises     Shoulder Instructions      Home Living Family/patient expects to be discharged to:: Skilled nursing facility                                 Additional Comments: Per chart review, pt is long term resident of [facility].        Prior Functioning/Environment Level of Independence: Needs assistance  Gait / Transfers Assistance Needed: patient's wife states he was mostly W/C bound but did walk a few steps with assistance. This may have been more than a year ago. ADL's / Homemaking Assistance Needed: Requires assist with dressing/bathing, but he is able to feed himself per wife. Communication / Swallowing Assistance Needed: non verbal at time of eval Comments: spouse providing PLOF, but also reports she hasn't been able to really see what hes been doing since last March (due to covid restrictions)        OT Problem List: Decreased strength;Decreased range of motion;Decreased activity tolerance;Impaired balance (sitting and/or standing);Impaired vision/perception;Decreased coordination;Decreased cognition;Decreased safety awareness;Pain;Impaired UE functional use      OT Treatment/Interventions: Self-care/ADL training;Therapeutic activities;Cognitive remediation/compensation;Balance training;Patient/family education;Visual/perceptual remediation/compensation;Therapeutic exercise;DME and/or AE instruction;Splinting    OT Goals(Current goals can be found in the care plan section) Acute Rehab OT Goals Patient Stated Goal: unable to state Time For Goal Achievement: 03/13/19 Potential to Achieve Goals: Fair  OT Frequency: Min 2X/week   Barriers to D/C:            Co-evaluation PT/OT/SLP Co-Evaluation/Treatment: Yes Reason for Co-Treatment: Necessary to address  cognition/behavior during functional activity;For patient/therapist safety;To address functional/ADL transfers PT goals addressed during session: Mobility/safety with mobility OT goals addressed during session: ADL's and self-care      AM-PAC OT "6 Clicks" Daily Activity     Outcome Measure Help from another person eating meals?: Total Help from another person taking care of personal grooming?: Total Help from another person toileting, which includes using toliet, bedpan, or urinal?: Total Help from another person bathing (including washing, rinsing, drying)?: Total Help from another person to put on and taking off regular upper body clothing?: Total Help from another person to put on and taking off regular lower body clothing?: Total 6 Click Score: 6   End of Session Nurse Communication: Mobility status  Activity Tolerance: Patient limited by lethargy Patient left: in bed;with call bell/phone within reach;with bed alarm set;with restraints reapplied;with family/visitor present;with SCD's reapplied  OT Visit Diagnosis: Other abnormalities of gait and mobility (R26.89);Muscle weakness (generalized) (M62.81);Pain;Other symptoms and signs involving cognitive function;Cognitive communication deficit (R41.841) Pain - part of body: (generalized )                Time: 0867-6195 OT Time Calculation (min): 25  min Charges:  OT General Charges $OT Visit: 1 Visit OT Evaluation $OT Eval Moderate Complexity: 1 Mod  Barry Brunner, OT Acute Rehabilitation Services Pager [phone] Office [phone]   Chancy Milroy 02/27/2019, 1:31 PM

## 2019-02-27 NOTE — Progress Notes (Signed)
Yellow muse score consistent with previous scores.

## 2019-02-27 NOTE — Progress Notes (Addendum)
PROGRESS NOTE  RICAHRD SCHWAGER QQV:[id number] DOB: [birth year] DOA: 02/23/2019 PCP: Iona Beard, MD  HPI/Recap of past 24 hours: Lee [name] is a 69 year old male with a past medical history significant for seizures, stroke, COPD, dementia, and alcohol abuse who presented to the emergency department from local skilled nursing facility,[facility], in acute respiratory distress with hypoxemia.Per nursing staff at skilled nursing facility patient was found in respiratory distress with gurgling sounds and tachypnea. Patient was last seen well during consumption of meal tray on reevaluation patient was found in respiratory distress.Per report upon arrival EMS found patient to be very hypoxemic for which he was placed on nonrebreather. Due to significant respiratory distress patient was intubated immediately on arrival to ED. Full CODE STATUS confirmed with patient's wife.  Vital signs on admission significant for tachypnea, tachycardia, and severe hypertension.Lab work significant for hypernatremia, hypokalemia, hyperchloremia, leukocytosis, and lactic acidosis.Chest x-ray with findings worrisome for pneumonia.  Also suspected aspiration PNA.  ESBL Ecoli UTI.  Was started on Meropenem which was switched to Zosyn due to concern for decreasing seizure threshold.  Looked to be seizing on 2/18 per PCCM.  EEG showed no active seizure activity.  On AEDs.   Transferred to progressive unit after extubation on 2/18.   TRH assumed care on 02/27/19.  2/19: Seen and examined.  He is lethargic and only responding to painful stimuli.  Does not follow any commands.  No recurrent seizure reported overnight.    Assessment/Plan: Active Problems:   Acute respiratory failure (HCC)  Aspiration Pneumonia Sputum culture with normal flora.  Extubated on 02/26/2019. Independently reviewed chest x-ray done on 02/26/2019 and previous chest x-rays, it shows right lower lobe infiltrates and right middle  lobe infiltrates.  Also some infiltrates noted at left lung. Core track in place with aspiration precautions. Lethargic, will start continuous pulse oximetry  Continue empiric antibiotics  Maintain O2 saturation greater than 92%.    Acute hypoxic respiratory failure secondary to HCAP versus aspiration pneumonia Intubated on admit Not on oxygen supplementation at baseline Extubated on 02/26/2019. O2 saturation 92% on room air currently.  ESBL E. coli UTI Urine culture sensitive to meropenem and Zosyn. Meropenem was discontinued 02/26/2019 due to suspected seizure activity. Continue IV Zosyn. Blood cultures negative to date.  Continue to follow cultures.  Seizure disorder Suspected seizure activity on 02/26/2019 EEG did not show active seizure activity Continue AEDs and avoid meropenem Seizure precaution  Acute metabolic encephalopathy likely multifactorial in the setting of UTI, possible postictal and AEDs side effects Lethargic this morning only responding to painful stimuli, does not follow commands. Delirium precautions Continuous pulse oximetry Closely monitor in progressive unit  Hypertension HLD -continue amlodipine (started this admit) via core track -Continue home statin  via core track  Suspected dysphagia post Cortrak placement Continue PEG tube placement Continue aspiration precautions Medications and feeding via Cortrak  Hypokalemia Potassium 3.4 Repleted  Hypophosphatemia Phosphorus 1.7 Repleted  COPD  -PRN albuterol    History of EtOH abuse This appears to be remote use as he lives in a SNF, doubt risk for withdrawal  -continue thiamine, folate -supportive care   Dementia -supportive care -delirium prevention measures   PVD Concern for cool left foot, has known peripheral disease.  Seen by [facility], has pulses by doppler. Deemed not a surgical candidate.   Best practice:  Diet: NPO Pain/Anxiety/Delirium protocol (if indicated): n/a  VAP  protocol (if indicated): n/a DVT prophylaxis: SCD's GI prophylaxis: Protonix Glucose control: Monitor Mobility: Bedrest Code Status: Full  code Family Communication: updated at time of evaluation Disposition:  Patient is from SNF.  Anticipate discharge to SNF.  Barrier to discharge ongoing treatment for ESBL E. coli UTI, persistent acute metabolic encephalopathy.     Objective: Vitals:   02/27/19 0155 02/27/19 0155 02/27/19 0300 02/27/19 0432  BP: (!) 157/84   (!) 165/82  Pulse: 80 80  79  Resp: 18   17  Temp: 99.2 F (37.3 C)   99.8 F (37.7 C)  TempSrc:      SpO2: (!) 89% 90%  92%  Weight:   61.7 kg   Height:        Intake/Output Summary (Last 24 hours) at 02/27/2019 0726 Last data filed at 02/27/2019 0600 Gross per 24 hour  Intake 866.43 ml  Output 300 ml  Net 566.43 ml   Filed Weights   02/25/19 0226 02/26/19 0500 02/27/19 0300  Weight: 58.8 kg 55.3 kg 61.7 kg    Exam:  . General: 69 y.o. year-old male well developed well nourished in no acute distress.  Lethargic.  Responds to painful stimuli.  And does not follow commands.  Cortrak in place. . Cardiovascular: Regular rate and rhythm with no rubs or gallops.  No thyromegaly or JVD noted.   [name] Respiratory: Audible rhonchorous sounds.  No wheezing noted.   . Abdomen: Soft nontender nondistended with normal bowel sounds present.  . Musculoskeletal: Trace lower extremity edema bilaterally . Psychiatry: Mood is appropriate for condition and setting   Data Reviewed: CBC: Recent Labs  Lab 02/23/19 1626 02/23/19 1642 02/24/19 0307 02/24/19 0428 02/25/19 1015 02/26/19 0332 02/27/19 0442  WBC 15.1*  --  11.6*  --  9.2 8.8 7.8  NEUTROABS 11.4*  --   --   --  6.2 6.9 5.3  HGB 15.2   < > 14.7 13.9 13.3 13.6 12.6*  HCT 47.4   < > 44.8 41.0 39.6 41.9 37.9*  MCV 102.4*  --  98.7  --  99.0 99.5 96.9  PLT 370  --  282  --  266 276 280   < > = values in this interval not displayed.   Basic Metabolic Panel: Recent  Labs  Lab 02/23/19 2116 02/23/19 2116 02/24/19 0307 02/24/19 0428 02/25/19 1015 02/26/19 0332 02/26/19 1218 02/26/19 1709 02/27/19 0442  NA 143   < > 144 144 142 144  --   --  142  K 3.6   < > 5.3* 3.2* 3.7 3.9  --   --  3.4*  CL 105  --  104  --  105 107  --   --  107  CO2 24  --  25  --  25 28  --   --  28  GLUCOSE 108*  --  102*  --  94 104*  --   --  140*  BUN 7*  --  9  --  8 7*  --   --  11  CREATININE 0.84  --  0.84  --  0.80 0.73  --   --  0.66  CALCIUM 9.0  --  8.4*  --  8.5* 8.8*  --   --  8.1*  MG  --   --  1.6*  --  1.9  --  1.8 1.8 1.8  PHOS  --   --  2.1*  --   --   --  2.5 2.1* 1.7*   < > = values in this interval not displayed.   GFR: Estimated Creatinine  Clearance: 76.1 mL/min (by C-G formula based on SCr of 0.66 mg/dL). Liver Function Tests: Recent Labs  Lab 02/23/19 1626  AST 25  ALT 24  ALKPHOS 136*  BILITOT 0.5  PROT 6.8  ALBUMIN 3.0*   No results for input(s): LIPASE, AMYLASE in the last 168 hours. No results for input(s): AMMONIA in the last 168 hours. Coagulation Profile: No results for input(s): INR, PROTIME in the last 168 hours. Cardiac Enzymes: No results for input(s): CKTOTAL, CKMB, CKMBINDEX, TROPONINI in the last 168 hours. BNP (last 3 results) No results for input(s): PROBNP in the last 8760 hours. HbA1C: No results for input(s): HGBA1C in the last 72 hours. CBG: Recent Labs  Lab 02/26/19 1207 02/26/19 1601 02/26/19 1950 02/26/19 2357 02/27/19 0431  GLUCAP 95 87 109* 112* 135*   Lipid Profile: No results for input(s): CHOL, HDL, LDLCALC, TRIG, CHOLHDL, LDLDIRECT in the last 72 hours. Thyroid Function Tests: No results for input(s): TSH, T4TOTAL, FREET4, T3FREE, THYROIDAB in the last 72 hours. Anemia Panel: Recent Labs    02/25/19 1215  VITAMINB12 935*   Urine analysis:    Component Value Date/Time   COLORURINE YELLOW 02/23/2019 1720   APPEARANCEUR HAZY (A) 02/23/2019 1720   LABSPEC 1.016 02/23/2019 1720   PHURINE  6.0 02/23/2019 1720   GLUCOSEU NEGATIVE 02/23/2019 1720   HGBUR NEGATIVE 02/23/2019 1720   BILIRUBINUR NEGATIVE 02/23/2019 1720   KETONESUR NEGATIVE 02/23/2019 1720   PROTEINUR 30 (A) 02/23/2019 1720   UROBILINOGEN 0.2 03/22/2014 1206   NITRITE NEGATIVE 02/23/2019 1720   LEUKOCYTESUR NEGATIVE 02/23/2019 1720   Sepsis Labs: '@LABRCNTIP'$ (procalcitonin:4,lacticidven:4)  ) Recent Results (from the past 240 hour(s))  Culture, blood (routine x 2)     Status: None (Preliminary result)   Collection Time: 02/23/19  4:26 PM   Specimen: BLOOD  Result Value Ref Range Status   Specimen Description BLOOD LEFT ANTECUBITAL  Final   Special Requests   Final    BOTTLES DRAWN AEROBIC AND ANAEROBIC Blood Culture adequate volume   Culture   Final    NO GROWTH 4 DAYS Performed at [facility], [place] [address], [place], [place] [address]    Report Status PENDING  Incomplete  Respiratory Panel by RT PCR (Flu A&B, Covid) - Nasopharyngeal Swab     Status: None   Collection Time: 02/23/19  4:27 PM   Specimen: Nasopharyngeal Swab  Result Value Ref Range Status   SARS Coronavirus 2 by RT PCR NEGATIVE NEGATIVE Final    Comment: (NOTE) SARS-CoV-2 target nucleic acids are NOT DETECTED. The SARS-CoV-2 RNA is generally detectable in upper respiratoy specimens during the acute phase of infection. The lowest concentration of SARS-CoV-2 viral copies this assay can detect is 131 copies/mL. A negative result does not preclude SARS-Cov-2 infection and should not be used as the sole basis for treatment or other patient management decisions. A negative result may occur with  improper specimen collection/handling, submission of specimen other than nasopharyngeal swab, presence of viral mutation(s) within the areas targeted by this assay, and inadequate number of viral copies (<131 copies/mL). A negative result must be combined with clinical observations, patient history, and epidemiological information.  The expected result is Negative. Fact Sheet for Patients:  PinkCheek.be Fact Sheet for Healthcare Providers:  GravelBags.it This test is not yet ap proved or cleared by the [place] FDA and  has been authorized for detection and/or diagnosis of SARS-CoV-2 by FDA under an Emergency Use Authorization (EUA). This EUA will remain  in  effect (meaning this test can be used) for the duration of the COVID-19 declaration under Section 564(b)(1) of the Act, 21 U.S.C. section 360bbb-3(b)(1), unless the authorization is terminated or revoked sooner.    Influenza A by PCR NEGATIVE NEGATIVE Final   Influenza B by PCR NEGATIVE NEGATIVE Final    Comment: (NOTE) The Xpert Xpress SARS-CoV-2/FLU/RSV assay is intended as an aid in  the diagnosis of influenza from Nasopharyngeal swab specimens and  should not be used as a sole basis for treatment. Nasal washings and  aspirates are unacceptable for Xpert Xpress SARS-CoV-2/FLU/RSV  testing. Fact Sheet for Patients: PinkCheek.be Fact Sheet for Healthcare Providers: GravelBags.it This test is not yet approved or cleared by the [place] FDA and  has been authorized for detection and/or diagnosis of SARS-CoV-2 by  FDA under an Emergency Use Authorization (EUA). This EUA will remain  in effect (meaning this test can be used) for the duration of the  Covid-19 declaration under Section 564(b)(1) of the Act, 21  U.S.C. section 360bbb-3(b)(1), unless the authorization is  terminated or revoked. Performed at [facility], [place] [address], [place], [place] [address]   Culture, blood (routine x 2)     Status: None (Preliminary result)   Collection Time: 02/23/19  4:28 PM   Specimen: BLOOD  Result Value Ref Range Status   Specimen Description BLOOD RIGHT ANTECUBITAL  Final   Special Requests   Final    BOTTLES DRAWN AEROBIC AND  ANAEROBIC Blood Culture adequate volume   Culture   Final    NO GROWTH 4 DAYS Performed at [facility], [place] [address], [place], [place] [address]    Report Status PENDING  Incomplete  MRSA PCR Screening     Status: None   Collection Time: 02/23/19  8:49 PM   Specimen: Nasal Mucosa; Nasopharyngeal  Result Value Ref Range Status   MRSA by PCR NEGATIVE NEGATIVE Final    Comment:        The GeneXpert MRSA Assay (FDA approved for NASAL specimens only), is one component of a comprehensive MRSA colonization surveillance program. It is not intended to diagnose MRSA infection nor to guide or monitor treatment for MRSA infections. Performed at [facility], [place] [address] [place], [place] [address]   Urine culture     Status: Abnormal   Collection Time: 02/23/19 10:34 PM   Specimen: Urine, Catheterized  Result Value Ref Range Status   Specimen Description URINE, CATHETERIZED  Final   Special Requests   Final    NONE Performed at [facility], [place] [address] [place], [place] [address]    Culture (A)  Final    >=100,000 COLONIES/mL ESCHERICHIA COLI Confirmed Extended Spectrum Beta-Lactamase Producer (ESBL).  In bloodstream infections from ESBL organisms, carbapenems are preferred over piperacillin/tazobactam. They are shown to have a lower risk of mortality.    Report Status 02/26/2019 FINAL  Final   Organism ID, Bacteria ESCHERICHIA COLI (A)  Final      Susceptibility   Escherichia coli - MIC*    AMPICILLIN >=32 RESISTANT Resistant     CEFAZOLIN >=64 RESISTANT Resistant     CEFTRIAXONE >=64 RESISTANT Resistant     CIPROFLOXACIN 1 SENSITIVE Sensitive     GENTAMICIN >=16 RESISTANT Resistant     IMIPENEM <=0.25 SENSITIVE Sensitive     NITROFURANTOIN <=16 SENSITIVE Sensitive     TRIMETH/SULFA >=320 RESISTANT Resistant     AMPICILLIN/SULBACTAM >=32 RESISTANT Resistant  PIP/TAZO <=4 SENSITIVE Sensitive     * >=100,000 COLONIES/mL ESCHERICHIA COLI  Culture,  respiratory (non-expectorated)     Status: None   Collection Time: 02/24/19  4:29 AM   Specimen: Tracheal Aspirate; Respiratory  Result Value Ref Range Status   Specimen Description TRACHEAL ASPIRATE  Final   Special Requests NONE  Final   Gram Stain   Final    ABUNDANT WBC PRESENT, PREDOMINANTLY PMN ABUNDANT GRAM POSITIVE COCCI IN CLUSTERS FEW GRAM NEGATIVE RODS    Culture   Final    Consistent with normal respiratory flora. Performed at [place] [facility], [place] [address], [place], [place] [address]    Report Status 02/26/2019 FINAL  Final      Studies: EEG  Result Date: 02/26/2019 Lora Havens, MD     02/26/2019  2:27 PM Patient Name: NADIA TORR MRN: [id number] Epilepsy Attending: Lora Havens Referring Physician/Provider: Dr. Ina Homes Date: 02/26/2019 Duration: 24.07 minutes Patient history: 69 year old male with history of epilepsy presented with acute hypoxic respiratory failure.  EEG to evaluate for seizures. Level of alertness: Comatose AEDs during EEG study: Carbamazepine, Vimpat, Keppra, clonazepam, valproic acid Technical aspects: This EEG study was done with scalp electrodes positioned according to the 10-20 International system of electrode placement. Electrical activity was acquired at a sampling rate of '500Hz'$  and reviewed with a high frequency filter of '70Hz'$  and a low frequency filter of '1Hz'$ . EEG data were recorded continuously and digitally stored. Description: EEG showed continuous generalized polymorphic 5 to 8 Hz theta-alpha activity as well as intermittent low amplitude generalized EEG suppression lasting 1 to 3 seconds.  EEG also showed frequent left frontal spikes, maximal Fp1, F3, Fz.  Hyperventilation and photic summation were not performed. Abnormality -Spikes, left frontal -Continuous slow, generalized -Background suppression, generalized IMPRESSION: This study showed evidence of epileptogenicity arising from left frontal region.  Additionally there  is evidence of severe diffuse encephalopathy, nonspecific etiology. No seizures were seen throughout the recording. Priyanka Barbra Sarks    Scheduled Meds: . amLODipine  10 mg Per Tube Daily  . carbamazepine  300 mg Per Tube BID  . chlorhexidine gluconate (MEDLINE KIT)  15 mL Mouth Rinse BID  . Chlorhexidine Gluconate Cloth  6 each Topical Q0600  . clonazepam  0.25 mg Per Tube BID  . clopidogrel  75 mg Per Tube Daily  . folic acid  1 mg Per Tube Daily  . heparin  5,000 Units Subcutaneous Q8H  . lacosamide  300 mg Per Tube BID  . levETIRAcetam  1,500 mg Per Tube BID  . mouth rinse  15 mL Mouth Rinse 10 times per day  . pantoprazole (PROTONIX) IV  40 mg Intravenous QHS  . simvastatin  10 mg Per Tube q1800  . thiamine  100 mg Per Tube Daily  . valproic acid  750 mg Per Tube Q8H    Continuous Infusions: . sodium chloride 1,000 mL (02/26/19 1110)  . feeding supplement (OSMOLITE 1.2 CAL) 65 mL/hr at 02/26/19 1243  . piperacillin-tazobactam (ZOSYN)  IV 3.375 g (02/27/19 0505)  . potassium chloride    . sodium phosphate  Dextrose 5% IVPB       LOS: 4 days     Kayleen Memos, MD [facility] Pager [phone]  If 7PM-7AM, please contact night-coverage [email or web address] Password [facility] 02/27/2019, 7:26 AM

## 2019-02-27 NOTE — Progress Notes (Signed)
Nutrition Follow-up  DOCUMENTATION CODES:   Severe malnutrition in context of chronic illness  INTERVENTION:   -Continue Osmolite 1.2 @ 65 ml/hr via NGT  135 ml free water flush every 6 hours  Tube feeding regimen provides 1872 kcal (100% of needs), 87 grams of protein, and 1279 ml of H2O. Total free water: 1819   NUTRITION DIAGNOSIS:   Severe Malnutrition related to chronic illness(COPD, CVA, dementia) as evidenced by moderate muscle depletion, severe muscle depletion, moderate fat depletion, severe fat depletion.  Ongoing  GOAL:   Patient will meet greater than or equal to 90% of their needs  Met with TF   MONITOR:   TF tolerance, Diet advancement, Labs  REASON FOR ASSESSMENT:   Consult Enteral/tube feeding initiation and management  ASSESSMENT:   69 yo male admitted with acute respiratory failure. PMH includes HTN, PVD, COPD, stroke, ETOH abuse, dementia, siezures. Noted allergy to orange juice.  2/17- extubated 2/18- TF initiated  Vascular surgery consulted for left cool foot; no evidence of ischemic changes to LE at this time.   Pt lying in bed at time of visit, unable to answer questions at time secondary to mental status. Pt wife at bedside, who was able to provide history. Pt is a resident of [facility], where has resided for approximately 2 years. Per wife, pt had a good appetite PTA- he consumed 3 meals per day on a regular diet and would generally consume 100% of meals, in addition of oral nutrition supplements and milkshakes between meals. Pt has had history of poor oral intake and dysphagia in the past; per wife, pt had a recent hospitalization where he was discharged on a pureed diet, but was able to advance to a regular diet within a few days of discharging back to the facility.   Pt wife reports that she thinks pt has gained weight over the past year; he appears to have gained weight since the COVID pandemic per her observation. She shares that he  had dropped down to 125# approximately 1 year ago and is regaining lost weight. Per wt hx; pt has experienced a 12% wt loss over the past year.   Per SLP note, pt remains to lethargic to participate in BSE. Explained to wife how pt will be receiving nutrition until able to take PO's safely. Pt wife has noticed improvement in mental status over the past few days. She had not questions or concerns at this time, however, expressed appreciation for RD visit.   Labs reviewed: Mg WDL. K: 3.4, Phos: 1.7 (on IV supplementation), CBGS: 108-135.   NUTRITION - FOCUSED PHYSICAL EXAM:    Most Recent Value  Orbital Region  Moderate depletion  Upper Arm Region  Severe depletion  Thoracic and Lumbar Region  Severe depletion  Buccal Region  Moderate depletion  Temple Region  Moderate depletion  Clavicle Bone Region  Severe depletion  Clavicle and Acromion Bone Region  Severe depletion  Scapular Bone Region  Severe depletion  Dorsal Hand  Unable to assess  Patellar Region  Moderate depletion  Anterior Thigh Region  Moderate depletion  Posterior Calf Region  Moderate depletion  Edema (RD Assessment)  None  Hair  Reviewed  Eyes  Reviewed  Mouth  Reviewed  Skin  Reviewed  Nails  Reviewed       Diet Order:   Diet Order            Diet NPO time specified  Diet effective now  EDUCATION NEEDS:   Education needs have been addressed  Skin:  Skin Assessment: Reviewed RN Assessment  Last BM:  02/26/19  Height:   Ht Readings from Last 1 Encounters:  02/26/19 5' 10" (1.778 m)    Weight:   Wt Readings from Last 1 Encounters:  02/27/19 61.7 kg    Ideal Body Weight:  75.5 kg  BMI:  Body mass index is 19.52 kg/m.  Estimated Nutritional Needs:   Kcal:  1750-2000  Protein:  80-95 gm  Fluid:  >/= 1.7 L     W, RD, LDN, CDCES Registered Dietitian II Certified Diabetes Care and Education Specialist Please refer to AMION for RD and/or RD on-call/weekend/after  hours pager 

## 2019-02-28 LAB — CBC WITH DIFFERENTIAL/PLATELET
Abs Immature Granulocytes: 0.07 10*3/uL (ref 0.00–0.07)
Basophils Absolute: 0 10*3/uL (ref 0.0–0.1)
Basophils Relative: 1 %
Eosinophils Absolute: 0.2 10*3/uL (ref 0.0–0.5)
Eosinophils Relative: 2 %
HCT: 36.9 % — ABNORMAL LOW (ref 39.0–52.0)
Hemoglobin: 12.2 g/dL — ABNORMAL LOW (ref 13.0–17.0)
Immature Granulocytes: 1 %
Lymphocytes Relative: 31 %
Lymphs Abs: 2.7 10*3/uL (ref 0.7–4.0)
MCH: 32.4 pg (ref 26.0–34.0)
MCHC: 33.1 g/dL (ref 30.0–36.0)
MCV: 97.9 fL (ref 80.0–100.0)
Monocytes Absolute: 0.8 10*3/uL (ref 0.1–1.0)
Monocytes Relative: 9 %
Neutro Abs: 4.9 10*3/uL (ref 1.7–7.7)
Neutrophils Relative %: 56 %
Platelets: 301 10*3/uL (ref 150–400)
RBC: 3.77 MIL/uL — ABNORMAL LOW (ref 4.22–5.81)
RDW: 14.6 % (ref 11.5–15.5)
WBC: 8.7 10*3/uL (ref 4.0–10.5)
nRBC: 0 % (ref 0.0–0.2)

## 2019-02-28 LAB — GLUCOSE, CAPILLARY
Glucose-Capillary: 101 mg/dL — ABNORMAL HIGH (ref 70–99)
Glucose-Capillary: 115 mg/dL — ABNORMAL HIGH (ref 70–99)
Glucose-Capillary: 115 mg/dL — ABNORMAL HIGH (ref 70–99)
Glucose-Capillary: 116 mg/dL — ABNORMAL HIGH (ref 70–99)
Glucose-Capillary: 120 mg/dL — ABNORMAL HIGH (ref 70–99)
Glucose-Capillary: 90 mg/dL (ref 70–99)

## 2019-02-28 LAB — CULTURE, BLOOD (ROUTINE X 2)
Culture: NO GROWTH
Culture: NO GROWTH
Special Requests: ADEQUATE
Special Requests: ADEQUATE

## 2019-02-28 LAB — BASIC METABOLIC PANEL
Anion gap: 10 (ref 5–15)
BUN: 9 mg/dL (ref 8–23)
CO2: 27 mmol/L (ref 22–32)
Calcium: 7.8 mg/dL — ABNORMAL LOW (ref 8.9–10.3)
Chloride: 103 mmol/L (ref 98–111)
Creatinine, Ser: 0.71 mg/dL (ref 0.61–1.24)
GFR calc Af Amer: >60 mL/min (ref >60)
GFR calc non Af Amer: >60 mL/min (ref >60)
Glucose, Bld: 116 mg/dL — ABNORMAL HIGH (ref 70–99)
Potassium: 3.4 mmol/L — ABNORMAL LOW (ref 3.5–5.1)
Sodium: 140 mmol/L (ref 135–145)

## 2019-02-28 LAB — MAGNESIUM: Magnesium: 1.6 mg/dL — ABNORMAL LOW (ref 1.7–2.4)

## 2019-02-28 MED ORDER — MAGNESIUM SULFATE 2 GM/50ML IV SOLN
2.0000 g | Freq: Once | INTRAVENOUS | Status: AC
  Administered 2019-02-28: 12:00:00 2 g via INTRAVENOUS
  Filled 2019-02-28: qty 50

## 2019-02-28 MED ORDER — HYDRALAZINE HCL 20 MG/ML IJ SOLN
5.0000 mg | Freq: Four times a day (QID) | INTRAMUSCULAR | Status: DC | PRN
  Administered 2019-02-28 – 2019-03-01 (×2): 5 mg via INTRAVENOUS
  Filled 2019-02-28 (×2): qty 1

## 2019-02-28 MED ORDER — POTASSIUM CHLORIDE 20 MEQ/15ML (10%) PO SOLN
40.0000 meq | Freq: Every day | ORAL | Status: AC
  Administered 2019-02-28 – 2019-03-01 (×2): 40 meq
  Filled 2019-02-28 (×2): qty 30

## 2019-02-28 MED ORDER — MAGNESIUM SULFATE 2 GM/50ML IV SOLN
2.0000 g | Freq: Once | INTRAVENOUS | Status: AC
  Administered 2019-02-28: 06:00:00 2 g via INTRAVENOUS
  Filled 2019-02-28: qty 50

## 2019-02-28 NOTE — Progress Notes (Signed)
Yellow MEWS score consistent with previous scores.

## 2019-02-28 NOTE — Plan of Care (Signed)
  Problem: Clinical Measurements: Goal: Ability to maintain clinical measurements within normal limits will improve Outcome: Progressing Goal: Will remain free from infection Outcome: Progressing Goal: Diagnostic test results will improve Outcome: Progressing Goal: Respiratory complications will improve Outcome: Progressing Goal: Cardiovascular complication will be avoided Outcome: Progressing   Problem: Activity: Goal: Risk for activity intolerance will decrease Outcome: Progressing   Problem: Nutrition: Goal: Adequate nutrition will be maintained Outcome: Progressing   Problem: Coping: Goal: Level of anxiety will decrease Outcome: Progressing   Problem: Elimination: Goal: Will not experience complications related to bowel motility Outcome: Progressing Goal: Will not experience complications related to urinary retention Outcome: Progressing   Problem: Pain Managment: Goal: General experience of comfort will improve Outcome: Progressing   Problem: Safety: Goal: Ability to remain free from injury will improve Outcome: Progressing   Problem: Skin Integrity: Goal: Risk for impaired skin integrity will decrease Outcome: Progressing   Problem: Education: Goal: Knowledge of General Education information will improve Description: Including pain rating scale, medication(s)/side effects and non-pharmacologic comfort measures Outcome: Not Progressing   Problem: Health Behavior/Discharge Planning: Goal: Ability to manage health-related needs will improve Outcome: Not Progressing   

## 2019-02-28 NOTE — Progress Notes (Signed)
PROGRESS NOTE  Lee [name] ZOX:[id number] DOB: 11-22-1950 DOA: 02/23/2019 PCP: Iona Beard, MD  HPI/Recap of past 24 hours: Lee [name] is a 69 year old male with a past medical history significant for seizures, stroke, COPD, dementia, and alcohol abuse who presented to the emergency department from local skilled nursing facility,[facility], in acute respiratory distress with hypoxemia.Per nursing staff at skilled nursing facility patient was found in respiratory distress with gurgling sounds and tachypnea. Patient was last seen well during consumption of meal tray on reevaluation patient was found in respiratory distress.Per report upon arrival EMS found patient to be very hypoxemic for which he was placed on nonrebreather. Due to significant respiratory distress patient was intubated immediately on arrival to ED. Full CODE STATUS confirmed with patient's wife.  Vital signs on admission significant for tachypnea, tachycardia, and severe hypertension.Lab work significant for hypernatremia, hypokalemia, hyperchloremia, leukocytosis, and lactic acidosis.Chest x-ray with findings worrisome for pneumonia.  Also suspected aspiration PNA.  ESBL Ecoli UTI.  Was started on Meropenem which was switched to Zosyn due to concern for decreasing seizure threshold.  Looked to be seizing on 2/18 per PCCM.  EEG showed no active seizure activity.  On AEDs.   Transferred to progressive unit after extubation on 2/18.   TRH assumed care on 02/27/19.  2/20: Seen and examined.  Lethargic and not following commands.  Responds to painful stimuli.  No recurrent seizures reported.      Assessment/Plan: Active Problems:   Acute respiratory failure (HCC)  Aspiration Pneumonia Sputum culture with normal flora.  Extubated on 02/26/2019. Independently reviewed chest x-ray done on 02/26/2019 and previous chest x-rays, it shows right lower lobe infiltrates and right middle lobe infiltrates.  Also  some infiltrates noted at left lung. Core track in place with aspiration precautions. Maintain head of bed elevated >30 degrees while on tube feeding.  Acute metabolic encephalopathy likely multifactorial in the setting of acute illness/ESBL E. coli UTI/pneumonia/AEDs side effects Lethargic and only responding to painful stimuli. Continue delirium precautions Continuous pulse oximetry Closely monitor in progressive unit  Resolved acute hypoxic respiratory failure secondary to HCAP versus aspiration pneumonia Intubated on admit Not on oxygen supplementation at baseline Extubated on 02/26/2019. O2 saturation 92% on room air currently.  ESBL E. coli UTI Urine culture positive for E. coli ESBL, sensitive to meropenem and Zosyn. Meropenem was discontinued 02/26/2019 due to suspected seizure activity. Continue IV Zosyn. Blood cultures negative to date.  Continue to follow cultures.  Seizure disorder Suspected seizure activity on 02/26/2019 EEG did not show active seizure activity Continue AEDs and avoid meropenem Continue seizure precaution  Hypertension HLD -continue amlodipine (started this admit) via core track -Continue home statin  via core track  Suspected dysphagia post Cortrak placement Continue PEG tube placement Continue aspiration precautions Medications and feeding via Cortrak Speech therapist following.  Refractory hypokalemia Potassium 3.4 Repleted with 40 mEq of potassium solution via tube  Refractory hypophosphatemia Phosphorus 1.7 Repleted with 4 g IV magnesium supplements  COPD  -PRN albuterol    History of EtOH abuse This appears to be remote use as he lives in a SNF, doubt risk for withdrawal  -continue thiamine, folate -supportive care   Dementia -supportive care -delirium prevention measures   PVD Concern for cool left foot, has known peripheral disease.  Seen by [facility], has pulses by doppler. Deemed not a surgical candidate.   Best  practice:  Diet: NPO Pain/Anxiety/Delirium protocol (if indicated): n/a  VAP protocol (if indicated): n/a DVT prophylaxis: SCD's GI  prophylaxis: Protonix Glucose control: Monitor Mobility: Bedrest Code Status: Full code Family Communication: updated at time of evaluation    Disposition:  Patient is from SNF.  Anticipate discharge to SNF.  Barrier to discharge ongoing treatment for ESBL E. coli UTI, persistent acute metabolic encephalopathy.     Objective: Vitals:   02/28/19 0500 02/28/19 0600 02/28/19 0700 02/28/19 0755  BP:    (!) 176/130  Pulse:      Resp: (!) 22 20 (!) 25   Temp:    98.2 F (36.8 C)  TempSrc:    Oral  SpO2: 93% 95% 94%   Weight:      Height:        Intake/Output Summary (Last 24 hours) at 02/28/2019 1038 Last data filed at 02/28/2019 9147 Gross per 24 hour  Intake 1142.96 ml  Output 1025 ml  Net 117.96 ml   Filed Weights   02/26/19 0500 02/27/19 0300 02/28/19 0400  Weight: 55.3 kg 61.7 kg 62.2 kg    Exam:  . General: 69 y.o. year-old male 1 4 upon nourished in no acute distress.  Lethargic.  Responds to painful stimuli.  Does not follow commands.  Core track in place.   . Cardiovascular: Regular rate and rhythm no rubs or gallops.   [name] Respiratory: Rales noted anteriorly.  No wheezing noted. . Abdomen: Soft nontender normal bowel sounds present. . Musculoskeletal: Trace lower extremity edema bilaterally. [name] Psychiatry: Unable to assess mood due to lethargy.  Data Reviewed: CBC: Recent Labs  Lab 02/23/19 1626 02/23/19 1642 02/24/19 0307 02/24/19 0307 02/24/19 0428 02/25/19 1015 02/26/19 0332 02/27/19 0442 02/28/19 0500  WBC 15.1*   < > 11.6*  --   --  9.2 8.8 7.8 8.7  NEUTROABS 11.4*  --   --   --   --  6.2 6.9 5.3 4.9  HGB 15.2   < > 14.7   < > 13.9 13.3 13.6 12.6* 12.2*  HCT 47.4   < > 44.8   < > 41.0 39.6 41.9 37.9* 36.9*  MCV 102.4*   < > 98.7  --   --  99.0 99.5 96.9 97.9  PLT 370   < > 282  --   --  266 276 280 301   < > =  values in this interval not displayed.   Basic Metabolic Panel: Recent Labs  Lab 02/24/19 0307 02/24/19 0307 02/24/19 0428 02/25/19 1015 02/25/19 1015 02/26/19 0332 02/26/19 1218 02/26/19 1709 02/27/19 0442 02/27/19 1638 02/28/19 0500  NA 144   < > 144 142  --  144  --   --  142  --  140  K 5.3*   < > 3.2* 3.7  --  3.9  --   --  3.4*  --  3.4*  CL 104  --   --  105  --  107  --   --  107  --  103  CO2 25  --   --  25  --  28  --   --  28  --  27  GLUCOSE 102*  --   --  94  --  104*  --   --  140*  --  116*  BUN 9  --   --  8  --  7*  --   --  11  --  9  CREATININE 0.84  --   --  0.80  --  0.73  --   --  0.66  --  0.71  CALCIUM 8.4*  --   --  8.5*  --  8.8*  --   --  8.1*  --  7.8*  MG 1.6*   < >  --  1.9   < >  --  1.8 1.8 1.8 1.5* 1.6*  PHOS 2.1*  --   --   --   --   --  2.5 2.1* 1.7* 2.8  --    < > = values in this interval not displayed.   GFR: Estimated Creatinine Clearance: 76.7 mL/min (by C-G formula based on SCr of 0.71 mg/dL). Liver Function Tests: Recent Labs  Lab 02/23/19 1626  AST 25  ALT 24  ALKPHOS 136*  BILITOT 0.5  PROT 6.8  ALBUMIN 3.0*   No results for input(s): LIPASE, AMYLASE in the last 168 hours. No results for input(s): AMMONIA in the last 168 hours. Coagulation Profile: No results for input(s): INR, PROTIME in the last 168 hours. Cardiac Enzymes: No results for input(s): CKTOTAL, CKMB, CKMBINDEX, TROPONINI in the last 168 hours. BNP (last 3 results) No results for input(s): PROBNP in the last 8760 hours. HbA1C: No results for input(s): HGBA1C in the last 72 hours. CBG: Recent Labs  Lab 02/27/19 1626 02/27/19 1951 02/28/19 0021 02/28/19 0411 02/28/19 0725  GLUCAP 119* 118* 101* 90 115*   Lipid Profile: No results for input(s): CHOL, HDL, LDLCALC, TRIG, CHOLHDL, LDLDIRECT in the last 72 hours. Thyroid Function Tests: No results for input(s): TSH, T4TOTAL, FREET4, T3FREE, THYROIDAB in the last 72 hours. Anemia Panel: Recent Labs     02/25/19 1215  VITAMINB12 935*   Urine analysis:    Component Value Date/Time   COLORURINE YELLOW 02/23/2019 1720   APPEARANCEUR HAZY (A) 02/23/2019 1720   LABSPEC 1.016 02/23/2019 1720   PHURINE 6.0 02/23/2019 1720   GLUCOSEU NEGATIVE 02/23/2019 1720   HGBUR NEGATIVE 02/23/2019 1720   BILIRUBINUR NEGATIVE 02/23/2019 1720   KETONESUR NEGATIVE 02/23/2019 1720   PROTEINUR 30 (A) 02/23/2019 1720   UROBILINOGEN 0.2 03/22/2014 1206   NITRITE NEGATIVE 02/23/2019 1720   LEUKOCYTESUR NEGATIVE 02/23/2019 1720   Sepsis Labs: _0 (procalcitonin:4,lacticidven:4)  ) Recent Results (from the past 240 hour(s))  Culture, blood (routine x 2)     Status: None   Collection Time: 02/23/19  4:26 PM   Specimen: BLOOD  Result Value Ref Range Status   Specimen Description BLOOD LEFT ANTECUBITAL  Final   Special Requests   Final    BOTTLES DRAWN AEROBIC AND ANAEROBIC Blood Culture adequate volume   Culture NO GROWTH 5 DAYS  Final   Report Status 02/28/2019 FINAL  Final  Respiratory Panel by RT PCR (Flu A&B, Covid) - Nasopharyngeal Swab     Status: None   Collection Time: 02/23/19  4:27 PM   Specimen: Nasopharyngeal Swab  Result Value Ref Range Status   SARS Coronavirus 2 by RT PCR NEGATIVE NEGATIVE Final    Comment: (NOTE) SARS-CoV-2 target nucleic acids are NOT DETECTED. The SARS-CoV-2 RNA is generally detectable in upper respiratoy specimens during the acute phase of infection. The lowest concentration of SARS-CoV-2 viral copies this assay can detect is 131 copies/mL. A negative result does not preclude SARS-Cov-2 infection and should not be used as the sole basis for treatment or other patient management decisions. A negative result may occur with  improper specimen collection/handling, submission of specimen other than nasopharyngeal swab, presence of viral mutation(s) within the areas targeted by this assay, and inadequate number of viral copies (<131 copies/mL). A  negative  result must be combined with clinical observations, patient history, and epidemiological information. The expected result is Negative. Fact Sheet for Patients:  PinkCheek.be Fact Sheet for Healthcare Providers:  GravelBags.it This test is not yet ap proved or cleared by the [place] FDA and  has been authorized for detection and/or diagnosis of SARS-CoV-2 by FDA under an Emergency Use Authorization (EUA). This EUA will remain  in effect (meaning this test can be used) for the duration of the COVID-19 declaration under Section 564(b)(1) of the Act, 21 U.S.C. section 360bbb-3(b)(1), unless the authorization is terminated or revoked sooner.    Influenza A by PCR NEGATIVE NEGATIVE Final   Influenza B by PCR NEGATIVE NEGATIVE Final    Comment: (NOTE) The Xpert Xpress SARS-CoV-2/FLU/RSV assay is intended as an aid in  the diagnosis of influenza from Nasopharyngeal swab specimens and  should not be used as a sole basis for treatment. Nasal washings and  aspirates are unacceptable for Xpert Xpress SARS-CoV-2/FLU/RSV  testing. Fact Sheet for Patients: PinkCheek.be Fact Sheet for Healthcare Providers: GravelBags.it This test is not yet approved or cleared by the [place] FDA and  has been authorized for detection and/or diagnosis of SARS-CoV-2 by  FDA under an Emergency Use Authorization (EUA). This EUA will remain  in effect (meaning this test can be used) for the duration of the  Covid-19 declaration under Section 564(b)(1) of the Act, 21  U.S.C. section 360bbb-3(b)(1), unless the authorization is  terminated or revoked. Performed at [facility], [place] [address] [place], [place] [address]   Culture, blood (routine x 2)     Status: None   Collection Time: 02/23/19  4:28 PM   Specimen: BLOOD  Result Value Ref Range Status   Specimen Description BLOOD RIGHT  ANTECUBITAL  Final   Special Requests   Final    BOTTLES DRAWN AEROBIC AND ANAEROBIC Blood Culture adequate volume   Culture NO GROWTH 5 DAYS  Final   Report Status 02/28/2019 FINAL  Final  MRSA PCR Screening     Status: None   Collection Time: 02/23/19  8:49 PM   Specimen: Nasal Mucosa; Nasopharyngeal  Result Value Ref Range Status   MRSA by PCR NEGATIVE NEGATIVE Final    Comment:        The GeneXpert MRSA Assay (FDA approved for NASAL specimens only), is one component of a comprehensive MRSA colonization surveillance program. It is not intended to diagnose MRSA infection nor to guide or monitor treatment for MRSA infections. Performed at [facility], [place] [address], [place], [place] [address]   Urine culture     Status: Abnormal   Collection Time: 02/23/19 10:34 PM   Specimen: Urine, Catheterized  Result Value Ref Range Status   Specimen Description URINE, CATHETERIZED  Final   Special Requests   Final    NONE Performed at [facility], [place] [address], [place], [place] [address]    Culture (A)  Final    >=100,000 COLONIES/mL ESCHERICHIA COLI Confirmed Extended Spectrum Beta-Lactamase Producer (ESBL).  In bloodstream infections from ESBL organisms, carbapenems are preferred over piperacillin/tazobactam. They are shown to have a lower risk of mortality.    Report Status 02/26/2019 FINAL  Final   Organism ID, Bacteria ESCHERICHIA COLI (A)  Final      Susceptibility   Escherichia coli - MIC*    AMPICILLIN >=32 RESISTANT Resistant     CEFAZOLIN >=64 RESISTANT Resistant     CEFTRIAXONE >=64 RESISTANT  Resistant     CIPROFLOXACIN 1 SENSITIVE Sensitive     GENTAMICIN >=16 RESISTANT Resistant     IMIPENEM <=0.25 SENSITIVE Sensitive     NITROFURANTOIN <=16 SENSITIVE Sensitive     TRIMETH/SULFA >=320 RESISTANT Resistant     AMPICILLIN/SULBACTAM >=32 RESISTANT Resistant     PIP/TAZO <=4 SENSITIVE Sensitive     * >=100,000 COLONIES/mL ESCHERICHIA COLI  Culture,  respiratory (non-expectorated)     Status: None   Collection Time: 02/24/19  4:29 AM   Specimen: Tracheal Aspirate; Respiratory  Result Value Ref Range Status   Specimen Description TRACHEAL ASPIRATE  Final   Special Requests NONE  Final   Gram Stain   Final    ABUNDANT WBC PRESENT, PREDOMINANTLY PMN ABUNDANT GRAM POSITIVE COCCI IN CLUSTERS FEW GRAM NEGATIVE RODS    Culture   Final    Consistent with normal respiratory flora. Performed at [facility], [place] [address] [place], [place] [address]    Report Status 02/26/2019 FINAL  Final      Studies: No results found.  Scheduled Meds: . amLODipine  10 mg Per Tube Daily  . carbamazepine  300 mg Per Tube BID  . chlorhexidine gluconate (MEDLINE KIT)  15 mL Mouth Rinse BID  . clonazepam  0.25 mg Per Tube BID  . clopidogrel  75 mg Per Tube Daily  . folic acid  1 mg Per Tube Daily  . free water  135 mL Per Tube Q6H  . heparin  5,000 Units Subcutaneous Q8H  . lacosamide  300 mg Per Tube BID  . levETIRAcetam  1,500 mg Per Tube BID  . mouth rinse  15 mL Mouth Rinse 10 times per day  . pantoprazole (PROTONIX) IV  40 mg Intravenous QHS  . simvastatin  10 mg Per Tube q1800  . thiamine  100 mg Per Tube Daily  . valproic acid  750 mg Per Tube Q8H    Continuous Infusions: . sodium chloride Stopped (02/26/19 1335)  . feeding supplement (OSMOLITE 1.2 CAL) 1,000 mL (02/27/19 1900)  . piperacillin-tazobactam (ZOSYN)  IV 3.375 g (02/28/19 0703)     LOS: 5 days     Kayleen Memos, MD [facility] Pager [phone]  If 7PM-7AM, please contact night-coverage [email or web address] Password [facility] 02/28/2019, 10:38 AM

## 2019-02-28 NOTE — NC FL2 (Signed)
[place] LEVEL OF CARE SCREENING TOOL     IDENTIFICATION  Patient Name: Lee [name] Birthdate: January 24, 1950 Sex: male Admission Date (Current Location): 02/23/2019  [facility] and [place] Number:  [name] and Address:  [facility] [place]. [facility], [place] [address] [place], [place] [address]      Provider Number: [id number]  Attending Physician Name and Address:  Kayleen Memos, DO  Relative Name and Phone Number:       Current Level of Care: Hospital Recommended Level of Care: [place] Prior Approval Number:    Date Approved/Denied:   PASRR Number: [id number]  Discharge Plan: SNF    Current Diagnoses: Patient Active Problem List   Diagnosis Date Noted  . Acute respiratory failure ([place]) 02/23/2019  . Pressure injury of skin 06/30/2018  . Acute respiratory failure with hypoxia ([place])   . Seizure disorder ([place]) 06/25/2018  . Seizure ([place]) 08/29/2016  . Leukocytosis 08/29/2016  . History of CVA (cerebrovascular accident) 08/29/2016  . Somnolence 08/29/2016  . Post-ictal state ([place]) 08/29/2016  . Lip laceration   . Pain   . Leg weakness, bilateral   . Leg pain, bilateral 08/17/2016  . UTI (urinary tract infection) 08/15/2016  . Aggression 07/26/2016  . Dementia with behavioral disturbance ([place]) 07/26/2016  . Elevated troponin 12/30/2015  . Chest pain 12/30/2015  . Pneumonia, likely aspiration 11/15/2013  . Status epilepticus ([place]) 11/14/2013  . Essential hypertension 11/14/2013  . Cerebral thrombosis with cerebral infarction ([place]) 11/07/2013  . Seizures ([place]) 11/06/2013  . Chronic airway obstruction, not elsewhere classified 09/22/2013  . Cerebral infarction due to unspecified mechanism 09/22/2013  . Hyperlipidemia 09/18/2013  . Encephalopathy acute 09/11/2013  . Altered mental status 09/11/2013  . CVA (cerebral infarction) 09/11/2013  . Hyponatremia 09/11/2013  . Acute encephalopathy 09/11/2013  .  Aftercare following surgery of the circulatory system, [place] 08/25/2012  . Foot swelling 02/04/2012  . Drainage from wound 01/04/2012  . Peripheral vascular disease, unspecified ([place]) 11/05/2011  . Pain in limb 11/05/2011  . Chronic total occlusion of artery of the extremities ([place]) 11/05/2011  . PAD (peripheral artery disease) ([place]) 10/28/2011  . TOBACCO ABUSE 02/06/2007  . SYMPTOM, EDEMA 06/13/2006  . ERECTILE DYSFUNCTION 01/25/2006    Orientation RESPIRATION BLADDER Height & Weight     (disoriented x4)  Normal Incontinent, External catheter Weight: 137 lb 2 oz (62.2 kg) Height:  '5\' 10"'$  (177.8 cm)  BEHAVIORAL SYMPTOMS/MOOD NEUROLOGICAL BOWEL NUTRITION STATUS      Incontinent Diet, Feeding tube(see discharge summary)  AMBULATORY STATUS COMMUNICATION OF NEEDS Skin   Extensive Assist Verbally Skin abrasions, Other (Comment)(abrasion on left lower extremity; cracking on bilateral heels)                       Personal Care Assistance Level of Assistance  Bathing, Feeding, Dressing Bathing Assistance: Maximum assistance Feeding assistance: Limited assistance Dressing Assistance: Maximum assistance     Functional Limitations Info  Sight, Hearing, Speech Sight Info: Adequate Hearing Info: Adequate Speech Info: Adequate    SPECIAL CARE FACTORS FREQUENCY  OT (By licensed OT), PT (By licensed PT)     PT Frequency: 5x week OT Frequency: 5x week            Contractures      Additional Factors Info  Code Status, Allergies, Isolation Precautions, Psychotropic Code Status Info: Full Code Allergies Info: Orange Juice (Orange Oil), Penicillins, Vicodin (Hydrocodone-acetaminophen), Oxycodone Psychotropic Info: clonazePAM (KLONOPIN) disintegrating  tablet 0.25 mg daily per tube   Isolation Precautions Info: contact precautions for ESBL     Current Medications (02/28/2019):  This is the current hospital active medication list Current Facility-Administered Medications   Medication Dose Route Frequency Provider Last Rate Last Admin  . 0.9 %  sodium chloride infusion   Intravenous PRN Candee Furbish, MD   Stopped at 02/26/19 1335  . acetaminophen (TYLENOL) 160 MG/5ML solution 500 mg  500 mg Oral Q8H PRN Magdalen Spatz, NP   500 mg at 02/26/19 2109  . albuterol (PROVENTIL) (2.5 MG/3ML) 0.083% nebulizer solution 2.5 mg  2.5 mg Nebulization Q3H PRN Ollis, Brandi L, NP      . amLODipine (NORVASC) tablet 10 mg  10 mg Per Tube Daily Candee Furbish, MD   10 mg at 02/28/19 0851  . bisacodyl (DULCOLAX) suppository 10 mg  10 mg Rectal Daily PRN Merlene Laughter F, NP      . carbamazepine (TEGRETOL) tablet 300 mg  300 mg Per Tube BID Shearon Stalls, Rahul P, PA-C   300 mg at 02/28/19 0851  . chlorhexidine gluconate (MEDLINE KIT) (PERIDEX) 0.12 % solution 15 mL  15 mL Mouth Rinse BID Olalere, Adewale A, MD   15 mL at 02/28/19 0854  . clonazePAM (KLONOPIN) disintegrating tablet 0.25 mg  0.25 mg Per Tube BID Candee Furbish, MD   0.25 mg at 02/28/19 0851  . clopidogrel (PLAVIX) tablet 75 mg  75 mg Per Tube Daily Desai, Rahul P, PA-C   75 mg at 02/28/19 0850  . docusate (COLACE) 50 MG/5ML liquid 100 mg  100 mg Per Tube BID PRN Merlene Laughter F, NP      . feeding supplement (OSMOLITE 1.2 CAL) liquid 1,000 mL  1,000 mL Per Tube Continuous Ollis, Brandi L, NP 65 mL/hr at 02/27/19 1900 1,000 mL at 02/27/19 1900  . folic acid (FOLVITE) tablet 1 mg  1 mg Per Tube Daily Ollis, Brandi L, NP   1 mg at 02/28/19 0851  . free water 135 mL  135 mL Per Tube Q6H Hall, Carole N, DO   135 mL at 02/28/19 1155  . heparin injection 5,000 Units  5,000 Units Subcutaneous Q8H Merlene Laughter F, NP   5,000 Units at 02/28/19 [id number]  . labetalol (NORMODYNE) injection 10-20 mg  10-20 mg Intravenous Q10 min PRN Shearon Stalls, Rahul P, PA-C   20 mg at 02/26/19 0348  . lacosamide (VIMPAT) tablet 300 mg  300 mg Per Tube BID Olalere, Adewale A, MD   300 mg at 02/28/19 0854  . levETIRAcetam (KEPPRA) 100 MG/ML solution 1,500 mg   1,500 mg Per Tube BID Shearon Stalls, Rahul P, PA-C   1,500 mg at 02/28/19 3244  . magnesium sulfate IVPB 2 g 50 mL  2 g Intravenous Once [place], DO 50 mL/hr at 02/28/19 1154 2 g at 02/28/19 1154  . MEDLINE mouth rinse  15 mL Mouth Rinse 10 times per day Olalere, Adewale A, MD   15 mL at 02/28/19 1155  . pantoprazole (PROTONIX) injection 40 mg  40 mg Intravenous QHS Merlene Laughter F, NP   40 mg at 02/27/19 2202  . piperacillin-tazobactam (ZOSYN) IVPB 3.375 g  3.375 g Intravenous Q8H Candee Furbish, MD 12.5 mL/hr at 02/28/19 0703 3.375 g at 02/28/19 0703  . potassium chloride 20 MEQ/15ML (10%) solution 40 mEq  40 mEq Per Tube Daily Irene Pap N, DO   40 mEq at 02/28/19 1153  . simvastatin (ZOCOR) tablet 10  mg  10 mg Per Tube q1800 Ollis, Brandi L, NP   10 mg at 02/27/19 1711  . thiamine tablet 100 mg  100 mg Per Tube Daily Ollis, Brandi L, NP   100 mg at 02/28/19 0850  . valproic acid (DEPAKENE) 250 MG/5ML solution 750 mg  750 mg Per Tube Q8H Desai, Rahul P, PA-C   750 mg at 02/28/19 2883     Discharge Medications: Please see discharge summary for a list of discharge medications.  Relevant Imaging Results:  Relevant Lab Results:   Additional [place], LCSW
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
## 2019-02-28 NOTE — Progress Notes (Signed)
Yellow muse score consistent with previous scores. 

## 2019-03-01 LAB — BASIC METABOLIC PANEL
Anion gap: 9 (ref 5–15)
BUN: 8 mg/dL (ref 8–23)
CO2: 27 mmol/L (ref 22–32)
Calcium: 8.1 mg/dL — ABNORMAL LOW (ref 8.9–10.3)
Chloride: 103 mmol/L (ref 98–111)
Creatinine, Ser: 0.56 mg/dL — ABNORMAL LOW (ref 0.61–1.24)
GFR calc Af Amer: >60 mL/min (ref >60)
GFR calc non Af Amer: >60 mL/min (ref >60)
Glucose, Bld: 115 mg/dL — ABNORMAL HIGH (ref 70–99)
Potassium: 3.8 mmol/L (ref 3.5–5.1)
Sodium: 139 mmol/L (ref 135–145)

## 2019-03-01 LAB — CBC WITH DIFFERENTIAL/PLATELET
Abs Immature Granulocytes: 0.07 10*3/uL (ref 0.00–0.07)
Basophils Absolute: 0 10*3/uL (ref 0.0–0.1)
Basophils Relative: 1 %
Eosinophils Absolute: 0.1 10*3/uL (ref 0.0–0.5)
Eosinophils Relative: 2 %
HCT: 40.7 % (ref 39.0–52.0)
Hemoglobin: 13.8 g/dL (ref 13.0–17.0)
Immature Granulocytes: 1 %
Lymphocytes Relative: 27 %
Lymphs Abs: 2.4 10*3/uL (ref 0.7–4.0)
MCH: 32.3 pg (ref 26.0–34.0)
MCHC: 33.9 g/dL (ref 30.0–36.0)
MCV: 95.3 fL (ref 80.0–100.0)
Monocytes Absolute: 0.8 10*3/uL (ref 0.1–1.0)
Monocytes Relative: 9 %
Neutro Abs: 5.3 10*3/uL (ref 1.7–7.7)
Neutrophils Relative %: 60 %
Platelets: 355 10*3/uL (ref 150–400)
RBC: 4.27 MIL/uL (ref 4.22–5.81)
RDW: 14.6 % (ref 11.5–15.5)
WBC: 8.8 10*3/uL (ref 4.0–10.5)
nRBC: 0 % (ref 0.0–0.2)

## 2019-03-01 LAB — GLUCOSE, CAPILLARY
Glucose-Capillary: 111 mg/dL — ABNORMAL HIGH (ref 70–99)
Glucose-Capillary: 114 mg/dL — ABNORMAL HIGH (ref 70–99)
Glucose-Capillary: 110 mg/dL — ABNORMAL HIGH (ref 70–99)
Glucose-Capillary: 110 mg/dL — ABNORMAL HIGH (ref 70–99)
Glucose-Capillary: 103 mg/dL — ABNORMAL HIGH (ref 70–99)
Glucose-Capillary: 112 mg/dL — ABNORMAL HIGH (ref 70–99)

## 2019-03-01 MED ORDER — HYDRALAZINE HCL 10 MG PO TABS
10.0000 mg | ORAL_TABLET | Freq: Three times a day (TID) | ORAL | Status: DC
  Administered 2019-03-01 – 2019-03-03 (×7): 10 mg
  Filled 2019-03-01 (×7): qty 1

## 2019-03-01 NOTE — Plan of Care (Signed)
  Problem: Safety: Goal: Ability to remain free from injury will improve Outcome: Progressing   

## 2019-03-01 NOTE — Progress Notes (Signed)
Updated the patient's spouse via phone.  All questions answered. 

## 2019-03-01 NOTE — Progress Notes (Signed)
PROGRESS NOTE  Lee [name] QPY:[id number] DOB: 1950/06/20 DOA: 02/23/2019 PCP: Iona Beard, MD  HPI/Recap of past 24 hours: Lee [name] is a 69 year old male with a past medical history significant for seizures, stroke, COPD, dementia, and alcohol abuse who presented to the emergency department from local skilled nursing facility,[facility], in acute respiratory distress with hypoxemia.Per nursing staff at skilled nursing facility patient was found in respiratory distress with gurgling sounds and tachypnea. Patient was last seen well during consumption of meal tray on reevaluation patient was found in respiratory distress.Per report upon arrival EMS found patient to be very hypoxemic for which he was placed on nonrebreather. Due to significant respiratory distress patient was intubated immediately on arrival to ED. Full CODE STATUS confirmed with patient's wife.  Vital signs on admission significant for tachypnea, tachycardia, and severe hypertension.Lab work significant for hypernatremia, hypokalemia, hyperchloremia, leukocytosis, and lactic acidosis.Chest x-ray with findings worrisome for pneumonia.  Also suspected aspiration PNA.  ESBL Ecoli UTI.  Was started on Meropenem which was switched to Zosyn due to concern for decreasing seizure threshold.  Looked to be seizing on 2/18 per PCCM.  EEG showed no active seizure activity.  On AEDs.   Transferred to progressive unit after extubation on 2/18.   TRH assumed care on 02/27/19.  Hospital course complicated by lethargy.  More alert on 2/21 and following commands.  2/21: Seen and examined.  He is more alert, answers to simple yes and no questions.  Follows commands.  He denies any pain.  Core track in place with head of bed above 30 degrees as part of aspiration precautions.     Assessment/Plan: Active Problems:   Acute respiratory failure (HCC)  Aspiration Pneumonia Sputum culture with normal flora.  Extubated on  02/26/2019. Independently reviewed chest x-ray done on 02/26/2019 and previous chest x-rays, it shows right lower lobe infiltrates and right middle lobe infiltrates.  Also some infiltrates noted at left lung. Core track in place with aspiration precautions. Continue to maintain head of bed elevated >30 degrees while on tube feeding.  Improving acute metabolic encephalopathy likely multifactorial in the setting of acute illness/ESBL E. coli UTI/pneumonia/AEDs side effect He is more alert, answers to simple yes and no questions.  Follows commands.  He denies any pain.   Resolved acute hypoxic respiratory failure secondary to HCAP versus aspiration pneumonia Intubated on admit Not on oxygen supplementation at baseline Extubated on 02/26/2019. O2 saturation 92% on room air currently.  ESBL E. coli UTI Urine culture positive for E. coli ESBL, sensitive to meropenem and Zosyn. Meropenem was discontinued 02/26/2019 due to suspected seizure activity. Continue IV Zosyn. Blood cultures negative to date.  Continue to follow cultures.  Seizure disorder Suspected seizure activity on 02/26/2019 EEG did not show active seizure activity Continue AEDs and avoid meropenem Continue seizure precaution Valproic acid level subtherapeutic 31 on 02/23/2019 Keppra and Tegretol therapeutic level  Hypertension, uncontrolled HLD -continue amlodipine (started this admit) via core track -Added hydralazine 10 mg 3 times daily via tube -Continue home statin  via core track  Suspected dysphagia post Cortrak placement Continue PEG tube placement Continue aspiration precautions Medications and feeding via Cortrak Speech therapist following.  Resolved refractory hypokalemia Potassium 3.4>> 3.8. Repleted  Resolved refractory hypophosphatemia Phosphorus 1.7>> 2.8 Repleted  COPD  -PRN albuterol    History of EtOH abuse This appears to be remote use as he lives in a SNF, doubt risk for withdrawal  -continue  thiamine, folate -supportive care   Dementia -supportive  care -delirium prevention measures   PVD Concern for cool left foot, has known peripheral disease.  Seen by [facility], has pulses by doppler. Deemed not a surgical candidate.   Best practice:  Diet: NPO Pain/Anxiety/Delirium protocol (if indicated): n/a  VAP protocol (if indicated): n/a DVT prophylaxis: SCD's GI prophylaxis: Protonix Glucose control: Monitor Mobility: Bedrest Code Status: Full code Family Communication: updated at time of evaluation    Disposition:  Patient is from SNF.  Anticipate discharge to SNF.  Barrier to discharge ongoing treatment for ESBL E. coli UTI, acute metabolic encephalopathy, SNF placement.     Objective: Vitals:   03/01/19 0701 03/01/19 0702 03/01/19 0800 03/01/19 0822  BP:   (!) 152/83   Pulse:    76  Resp: (!) 22 (!) 23 (!) 22   Temp:   98.6 F (37 C) 98.6 F (37 C)  TempSrc:   Oral Oral  SpO2: 96% 96% 95%   Weight:      Height:        Intake/Output Summary (Last 24 hours) at 03/01/2019 1142 Last data filed at 03/01/2019 0900 Gross per 24 hour  Intake 822.58 ml  Output 1400 ml  Net -577.42 ml   Filed Weights   02/27/19 0300 02/28/19 0400 03/01/19 0430  Weight: 61.7 kg 62.2 kg 62.3 kg    Exam:  . General: 69 y.o. year-old male well-developed well-nourished in no acute distress.  Alert minimally verbal.  Following commands.   . Cardiovascular: Regular rate and rhythm no rubs or gallops.   [name] Respiratory: Mild rales at bases.  No wheezing noted.  Poor inspiratory effort.  . Abdomen: Nontender bowel sounds present. . Musculoskeletal: Trace lower extremity edema bilaterally.   [name] Psychiatry: Mood is appropriate for condition and setting.      Data Reviewed: CBC: Recent Labs  Lab 02/25/19 1015 02/26/19 0332 02/27/19 0442 02/28/19 0500 03/01/19 0353  WBC 9.2 8.8 7.8 8.7 8.8  NEUTROABS 6.2 6.9 5.3 4.9 5.3  HGB 13.3 13.6 12.6* 12.2* 13.8  HCT 39.6 41.9 37.9*  36.9* 40.7  MCV 99.0 99.5 96.9 97.9 95.3  PLT 266 276 280 301 161   Basic Metabolic Panel: Recent Labs  Lab 02/24/19 0307 02/24/19 0428 02/25/19 1015 02/25/19 1015 02/26/19 0332 02/26/19 1218 02/26/19 1709 02/27/19 0442 02/27/19 1638 02/28/19 0500 03/01/19 0353  NA 144   < > 142  --  144  --   --  142  --  140 139  K 5.3*   < > 3.7  --  3.9  --   --  3.4*  --  3.4* 3.8  CL 104   < > 105  --  107  --   --  107  --  103 103  CO2 25   < > 25  --  28  --   --  28  --  27 27  GLUCOSE 102*   < > 94  --  104*  --   --  140*  --  116* 115*  BUN 9   < > 8  --  7*  --   --  11  --  9 8  CREATININE 0.84   < > 0.80  --  0.73  --   --  0.66  --  0.71 0.56*  CALCIUM 8.4*   < > 8.5*  --  8.8*  --   --  8.1*  --  7.8* 8.1*  MG 1.6*   < > 1.9   < >  --  1.8 1.8 1.8 1.5* 1.6*  --   PHOS 2.1*  --   --   --   --  2.5 2.1* 1.7* 2.8  --   --    < > = values in this interval not displayed.   GFR: Estimated Creatinine Clearance: 76.8 mL/min (A) (by C-G formula based on SCr of 0.56 mg/dL (L)). Liver Function Tests: Recent Labs  Lab 02/23/19 1626  AST 25  ALT 24  ALKPHOS 136*  BILITOT 0.5  PROT 6.8  ALBUMIN 3.0*   No results for input(s): LIPASE, AMYLASE in the last 168 hours. No results for input(s): AMMONIA in the last 168 hours. Coagulation Profile: No results for input(s): INR, PROTIME in the last 168 hours. Cardiac Enzymes: No results for input(s): CKTOTAL, CKMB, CKMBINDEX, TROPONINI in the last 168 hours. BNP (last 3 results) No results for input(s): PROBNP in the last 8760 hours. HbA1C: No results for input(s): HGBA1C in the last 72 hours. CBG: Recent Labs  Lab 02/28/19 2000 03/01/19 0010 03/01/19 0405 03/01/19 0726 03/01/19 1112  GLUCAP 115* 114* 111* 110* 110*   Lipid Profile: No results for input(s): CHOL, HDL, LDLCALC, TRIG, CHOLHDL, LDLDIRECT in the last 72 hours. Thyroid Function Tests: No results for input(s): TSH, T4TOTAL, FREET4, T3FREE, THYROIDAB in the last  72 hours. Anemia Panel: No results for input(s): VITAMINB12, FOLATE, FERRITIN, TIBC, IRON, RETICCTPCT in the last 72 hours. Urine analysis:    Component Value Date/Time   COLORURINE YELLOW 02/23/2019 1720   APPEARANCEUR HAZY (A) 02/23/2019 1720   LABSPEC 1.016 02/23/2019 1720   PHURINE 6.0 02/23/2019 1720   GLUCOSEU NEGATIVE 02/23/2019 1720   HGBUR NEGATIVE 02/23/2019 1720   BILIRUBINUR NEGATIVE 02/23/2019 1720   KETONESUR NEGATIVE 02/23/2019 1720   PROTEINUR 30 (A) 02/23/2019 1720   UROBILINOGEN 0.2 03/22/2014 1206   NITRITE NEGATIVE 02/23/2019 1720   LEUKOCYTESUR NEGATIVE 02/23/2019 1720   Sepsis Labs: '@LABRCNTIP'$ (procalcitonin:4,lacticidven:4)  ) Recent Results (from the past 240 hour(s))  Culture, blood (routine x 2)     Status: None   Collection Time: 02/23/19  4:26 PM   Specimen: BLOOD  Result Value Ref Range Status   Specimen Description BLOOD LEFT ANTECUBITAL  Final   Special Requests   Final    BOTTLES DRAWN AEROBIC AND ANAEROBIC Blood Culture adequate volume   Culture NO GROWTH 5 DAYS  Final   Report Status 02/28/2019 FINAL  Final  Respiratory Panel by RT PCR (Flu A&B, Covid) - Nasopharyngeal Swab     Status: None   Collection Time: 02/23/19  4:27 PM   Specimen: Nasopharyngeal Swab  Result Value Ref Range Status   SARS Coronavirus 2 by RT PCR NEGATIVE NEGATIVE Final    Comment: (NOTE) SARS-CoV-2 target nucleic acids are NOT DETECTED. The SARS-CoV-2 RNA is generally detectable in upper respiratoy specimens during the acute phase of infection. The lowest concentration of SARS-CoV-2 viral copies this assay can detect is 131 copies/mL. A negative result does not preclude SARS-Cov-2 infection and should not be used as the sole basis for treatment or other patient management decisions. A negative result may occur with  improper specimen collection/handling, submission of specimen other than nasopharyngeal swab, presence of viral mutation(s) within the areas targeted  by this assay, and inadequate number of viral copies (<131 copies/mL). A negative result must be combined with clinical observations, patient history, and epidemiological information. The expected result is Negative. Fact Sheet for Patients:  PinkCheek.be Fact Sheet for Healthcare Providers:  GravelBags.it This test is not  yet ap proved or cleared by the [place] and  has been authorized for detection and/or diagnosis of SARS-CoV-2 by FDA under an Emergency Use Authorization (EUA). This EUA will remain  in effect (meaning this test can be used) for the duration of the COVID-19 declaration under Section 564(b)(1) of the Act, 21 U.S.C. section 360bbb-3(b)(1), unless the authorization is terminated or revoked sooner.    Influenza A by PCR NEGATIVE NEGATIVE Final   Influenza B by PCR NEGATIVE NEGATIVE Final    Comment: (NOTE) The Xpert Xpress SARS-CoV-2/FLU/RSV assay is intended as an aid in  the diagnosis of influenza from Nasopharyngeal swab specimens and  should not be used as a sole basis for treatment. Nasal washings and  aspirates are unacceptable for Xpert Xpress SARS-CoV-2/FLU/RSV  testing. Fact Sheet for Patients: PinkCheek.be Fact Sheet for Healthcare Providers: GravelBags.it This test is not yet approved or cleared by the [place] FDA and  has been authorized for detection and/or diagnosis of SARS-CoV-2 by  FDA under an Emergency Use Authorization (EUA). This EUA will remain  in effect (meaning this test can be used) for the duration of the  Covid-19 declaration under Section 564(b)(1) of the Act, 21  U.S.C. section 360bbb-3(b)(1), unless the authorization is  terminated or revoked. Performed at [facility], [place] [address], [place], [place] [address]   Culture, blood (routine x 2)     Status: None   Collection Time: 02/23/19  4:28 PM    Specimen: BLOOD  Result Value Ref Range Status   Specimen Description BLOOD RIGHT ANTECUBITAL  Final   Special Requests   Final    BOTTLES DRAWN AEROBIC AND ANAEROBIC Blood Culture adequate volume   Culture NO GROWTH 5 DAYS  Final   Report Status 02/28/2019 FINAL  Final  MRSA PCR Screening     Status: None   Collection Time: 02/23/19  8:49 PM   Specimen: Nasal Mucosa; Nasopharyngeal  Result Value Ref Range Status   MRSA by PCR NEGATIVE NEGATIVE Final    Comment:        The GeneXpert MRSA Assay (FDA approved for NASAL specimens only), is one component of a comprehensive MRSA colonization surveillance program. It is not intended to diagnose MRSA infection nor to guide or monitor treatment for MRSA infections. Performed at [facility], [place] [address], [place], [place] [address]   Urine culture     Status: Abnormal   Collection Time: 02/23/19 10:34 PM   Specimen: Urine, Catheterized  Result Value Ref Range Status   Specimen Description URINE, CATHETERIZED  Final   Special Requests   Final    NONE Performed at [facility], [place] [address] [place], [place] [address]    Culture (A)  Final    >=100,000 COLONIES/mL ESCHERICHIA COLI Confirmed Extended Spectrum Beta-Lactamase Producer (ESBL).  In bloodstream infections from ESBL organisms, carbapenems are preferred over piperacillin/tazobactam. They are shown to have a lower risk of mortality.    Report Status 02/26/2019 FINAL  Final   Organism ID, Bacteria ESCHERICHIA COLI (A)  Final      Susceptibility   Escherichia coli - MIC*    AMPICILLIN >=32 RESISTANT Resistant     CEFAZOLIN >=64 RESISTANT Resistant     CEFTRIAXONE >=64 RESISTANT Resistant     CIPROFLOXACIN 1 SENSITIVE Sensitive     GENTAMICIN >=16 RESISTANT Resistant     IMIPENEM <=0.25 SENSITIVE Sensitive     NITROFURANTOIN <=16 SENSITIVE Sensitive  TRIMETH/SULFA >=320 RESISTANT Resistant     AMPICILLIN/SULBACTAM >=32 RESISTANT Resistant     PIP/TAZO  <=4 SENSITIVE Sensitive     * >=100,000 COLONIES/mL ESCHERICHIA COLI  Culture, respiratory (non-expectorated)     Status: None   Collection Time: 02/24/19  4:29 AM   Specimen: Tracheal Aspirate; Respiratory  Result Value Ref Range Status   Specimen Description TRACHEAL ASPIRATE  Final   Special Requests NONE  Final   Gram Stain   Final    ABUNDANT WBC PRESENT, PREDOMINANTLY PMN ABUNDANT GRAM POSITIVE COCCI IN CLUSTERS FEW GRAM NEGATIVE RODS    Culture   Final    Consistent with normal respiratory flora. Performed at [facility], [place] [address] [place], [place] [address]    Report Status 02/26/2019 FINAL  Final      Studies: No results found.  Scheduled Meds: . amLODipine  10 mg Per Tube Daily  . carbamazepine  300 mg Per Tube BID  . chlorhexidine gluconate (MEDLINE KIT)  15 mL Mouth Rinse BID  . clonazepam  0.25 mg Per Tube BID  . clopidogrel  75 mg Per Tube Daily  . folic acid  1 mg Per Tube Daily  . free water  135 mL Per Tube Q6H  . heparin  5,000 Units Subcutaneous Q8H  . hydrALAZINE  10 mg Per Tube Q8H  . lacosamide  300 mg Per Tube BID  . levETIRAcetam  1,500 mg Per Tube BID  . mouth rinse  15 mL Mouth Rinse 10 times per day  . pantoprazole (PROTONIX) IV  40 mg Intravenous QHS  . simvastatin  10 mg Per Tube q1800  . thiamine  100 mg Per Tube Daily  . valproic acid  750 mg Per Tube Q8H    Continuous Infusions: . sodium chloride Stopped (02/26/19 1335)  . feeding supplement (OSMOLITE 1.2 CAL) 1,000 mL (03/01/19 1116)  . piperacillin-tazobactam (ZOSYN)  IV Stopped (03/01/19 0132)     LOS: 6 days     Kayleen Memos, MD [facility] Pager [phone]  If 7PM-7AM, please contact night-coverage [email or web address] Password [facility] 03/01/2019, 11:42 AM

## 2019-03-02 LAB — CBC WITH DIFFERENTIAL/PLATELET
Abs Immature Granulocytes: 0.08 10*3/uL — ABNORMAL HIGH (ref 0.00–0.07)
Basophils Absolute: 0 10*3/uL (ref 0.0–0.1)
Basophils Relative: 1 %
Eosinophils Absolute: 0.1 10*3/uL (ref 0.0–0.5)
Eosinophils Relative: 2 %
HCT: 38 % — ABNORMAL LOW (ref 39.0–52.0)
Hemoglobin: 12.8 g/dL — ABNORMAL LOW (ref 13.0–17.0)
Immature Granulocytes: 1 %
Lymphocytes Relative: 32 %
Lymphs Abs: 2.7 10*3/uL (ref 0.7–4.0)
MCH: 32.7 pg (ref 26.0–34.0)
MCHC: 33.7 g/dL (ref 30.0–36.0)
MCV: 96.9 fL (ref 80.0–100.0)
Monocytes Absolute: 0.7 10*3/uL (ref 0.1–1.0)
Monocytes Relative: 9 %
Neutro Abs: 4.9 10*3/uL (ref 1.7–7.7)
Neutrophils Relative %: 55 %
Platelets: 388 10*3/uL (ref 150–400)
RBC: 3.92 MIL/uL — ABNORMAL LOW (ref 4.22–5.81)
RDW: 14.6 % (ref 11.5–15.5)
WBC: 8.6 10*3/uL (ref 4.0–10.5)
nRBC: 0 % (ref 0.0–0.2)

## 2019-03-02 LAB — BASIC METABOLIC PANEL
Anion gap: 11 (ref 5–15)
BUN: 10 mg/dL (ref 8–23)
CO2: 25 mmol/L (ref 22–32)
Calcium: 8.1 mg/dL — ABNORMAL LOW (ref 8.9–10.3)
Chloride: 102 mmol/L (ref 98–111)
Creatinine, Ser: 0.54 mg/dL — ABNORMAL LOW (ref 0.61–1.24)
GFR calc Af Amer: >60 mL/min (ref >60)
GFR calc non Af Amer: >60 mL/min (ref >60)
Glucose, Bld: 123 mg/dL — ABNORMAL HIGH (ref 70–99)
Potassium: 4 mmol/L (ref 3.5–5.1)
Sodium: 138 mmol/L (ref 135–145)

## 2019-03-02 LAB — GLUCOSE, CAPILLARY
Glucose-Capillary: 104 mg/dL — ABNORMAL HIGH (ref 70–99)
Glucose-Capillary: 114 mg/dL — ABNORMAL HIGH (ref 70–99)
Glucose-Capillary: 115 mg/dL — ABNORMAL HIGH (ref 70–99)
Glucose-Capillary: 92 mg/dL (ref 70–99)
Glucose-Capillary: 95 mg/dL (ref 70–99)

## 2019-03-02 NOTE — Progress Notes (Signed)
PROGRESS NOTE  Lee [name] AOZ:[id number] DOB: [birth year] DOA: 02/23/2019 PCP: Iona Beard, MD  HPI/Recap of past 24 hours: Lee [name] is a 69 year old male with a past medical history significant for seizures, stroke, COPD, dementia, and alcohol abuse who presented to the emergency department from local skilled nursing facility,[facility], in acute respiratory distress with hypoxemia.Per nursing staff at skilled nursing facility patient was found in respiratory distress with gurgling sounds and tachypnea. Patient was last seen well during consumption of meal tray on reevaluation patient was found in respiratory distress.Per report upon arrival EMS found patient to be very hypoxemic for which he was placed on nonrebreather. Due to significant respiratory distress patient was intubated immediately on arrival to ED. Full CODE STATUS confirmed with patient's wife.  Vital signs on admission significant for tachypnea, tachycardia, and severe hypertension.Lab work significant for hypernatremia, hypokalemia, hyperchloremia, leukocytosis, and lactic acidosis.Chest x-ray with findings worrisome for pneumonia.  Also suspected aspiration PNA.  ESBL Ecoli UTI.  Was started on Meropenem which was switched to Zosyn due to concern for decreasing seizure threshold.  Looked to be seizing on 2/18 per PCCM.  EEG showed no active seizure activity.  On AEDs.   Transferred to progressive unit after extubation on 2/18.   TRH assumed care on 02/27/19.  Hospital course complicated by lethargy.  More alert on 2/21 and following commands.  2/22: Seen and examined.  Somnolent.  Grimaces with minimal touch of his lower extremities bilaterally.   Assessment/Plan: Active Problems:   Acute respiratory failure (HCC)  Aspiration Pneumonia Sputum culture with normal flora.  Extubated on 02/26/2019. Independently reviewed chest x-ray done on 02/26/2019 and previous chest x-rays, it shows right lower  lobe infiltrates and right middle lobe infiltrates.  Also some infiltrates noted at left lung. Core track in place with aspiration precautions also in place. Continue to maintain head of bed elevated >30 degrees while on tube feeding.  Acute metabolic encephalopathy likely multifactorial in the setting of acute illness/ESBL E. coli UTI/pneumonia/AEDs side effect         Somnolent. Grimaces with minimal touch of his lower extremities bilaterally.         Continue to closely monitor and reorient as needed.  Resolved acute hypoxic respiratory failure secondary to HCAP versus aspiration pneumonia Intubated on admit Not on oxygen supplementation at baseline Extubated on 02/26/2019. O2 saturation 91% on room air currently.  ESBL E. coli UTI Urine culture positive for E. coli ESBL, sensitive to meropenem and Zosyn. Meropenem was discontinued 02/26/2019 due to suspected seizure activity. Continue IV Zosyn Day #5/7 of targeted IV antibiotic for ESBL E. Coli.  Received IV vancomycin and IV cefepime from 2/15>>2/18, not sensitive. Blood cx 2/15 negative FINAL.  Seizure disorder Suspected seizure activity on 02/26/2019 by PCCM provider EEG did not show active seizure activity Continue AEDs and avoid meropenem Continue seizure precaution Valproic acid level subtherapeutic 31 on 02/23/2019 Keppra and Tegretol therapeutic level  Hypertension, uncontrolled HLD -continue amlodipine (started this admit) via core track -Continue hydralazine 10 mg 3 times daily via tube -Continue home statin  via core track  Suspected dysphagia post Cortrak placement Continue PEG tube placement Continue aspiration precautions Medications and feeding via Cortrak Speech therapist will reassess once more alert.  Resolved refractory hypokalemia Potassium 3.4>> 3.8. Repleted  Resolved refractory hypophosphatemia Phosphorus 1.7>> 2.8 Repleted  COPD  -PRN albuterol    History of EtOH abuse This appears to be  remote use as he lives in a SNF, doubt risk  for withdrawal  -continue thiamine, folate -supportive care   Dementia -supportive care -delirium prevention measures   PVD Concern for cool left foot, has known peripheral disease.  Seen by [facility], has pulses by doppler. Deemed not a surgical candidate.   Best practice:  Diet: NPO Pain/Anxiety/Delirium protocol (if indicated): n/a  VAP protocol (if indicated): n/a DVT prophylaxis: SCD's GI prophylaxis: Protonix Glucose control: Monitor Mobility: Bedrest Code Status: Full code Family Communication: updated at time of evaluation    Disposition:  Patient is from SNF.  Anticipate discharge to SNF.  Barrier to discharge ongoing treatment for ESBL E. coli UTI, day number 5 out of 7 of ESBL E. coli targeted IV antibiotic, acute metabolic encephalopathy, SNF placement.     Objective: Vitals:   03/02/19 0400 03/02/19 0433 03/02/19 0739 03/02/19 0800  BP: (!) 156/84   (!) 171/81  Pulse:   75   Resp: (!) 23   18  Temp:  99.2 F (37.3 C) 98.9 F (37.2 C)   TempSrc:   Oral   SpO2: 97%   97%  Weight:      Height:        Intake/Output Summary (Last 24 hours) at 03/02/2019 6384 Last data filed at 03/02/2019 0844 Gross per 24 hour  Intake 445.05 ml  Output 650 ml  Net -204.95 ml   Filed Weights   02/28/19 0400 03/01/19 0430 03/02/19 0023  Weight: 62.2 kg 62.3 kg 63.5 kg    Exam:  . General: 69 y.o. year-old male well-developed well-nourished somnolent and does not interact. . Cardiovascular: Regular rate and rhythm no rubs or gallops. [name] Respiratory: Mild rales at bases no wheezing noted.  Respiratory effort. . Abdomen: Nontender bowel sounds present.   . Musculoskeletal: Diffuse tenderness lower extremities bilaterally with mild touch. . Psychiatry: Unable to assess mood due to somnolence.    Data Reviewed: CBC: Recent Labs  Lab 02/26/19 0332 02/27/19 0442 02/28/19 0500 03/01/19 0353 03/02/19 0240  WBC 8.8 7.8 8.7  8.8 8.6  NEUTROABS 6.9 5.3 4.9 5.3 4.9  HGB 13.6 12.6* 12.2* 13.8 12.8*  HCT 41.9 37.9* 36.9* 40.7 38.0*  MCV 99.5 96.9 97.9 95.3 96.9  PLT 276 280 301 355 536   Basic Metabolic Panel: Recent Labs  Lab 02/24/19 0307 02/24/19 0428 02/25/19 1015 02/26/19 0332 02/26/19 1218 02/26/19 1709 02/27/19 0442 02/27/19 1638 02/28/19 0500 03/01/19 0353 03/02/19 0240  NA 144   < >  --  144  --   --  142  --  140 139 138  K 5.3*   < >  --  3.9  --   --  3.4*  --  3.4* 3.8 4.0  CL 104   < >  --  107  --   --  107  --  103 103 102  CO2 25   < >  --  28  --   --  28  --  '27 27 25  '$ GLUCOSE 102*   < >  --  104*  --   --  140*  --  116* 115* 123*  BUN 9   < >  --  7*  --   --  11  --  '9 8 10  '$ CREATININE 0.84   < >  --  0.73  --   --  0.66  --  0.71 0.56* 0.54*  CALCIUM 8.4*   < >  --  8.8*  --   --  8.1*  --  7.8* 8.1* 8.1*  MG 1.6*   < >   < >  --  1.8 1.8 1.8 1.5* 1.6*  --   --   PHOS 2.1*  --   --   --  2.5 2.1* 1.7* 2.8  --   --   --    < > = values in this interval not displayed.   GFR: Estimated Creatinine Clearance: 78.3 mL/min (A) (by C-G formula based on SCr of 0.54 mg/dL (L)). Liver Function Tests: Recent Labs  Lab 02/23/19 1626  AST 25  ALT 24  ALKPHOS 136*  BILITOT 0.5  PROT 6.8  ALBUMIN 3.0*   No results for input(s): LIPASE, AMYLASE in the last 168 hours. No results for input(s): AMMONIA in the last 168 hours. Coagulation Profile: No results for input(s): INR, PROTIME in the last 168 hours. Cardiac Enzymes: No results for input(s): CKTOTAL, CKMB, CKMBINDEX, TROPONINI in the last 168 hours. BNP (last 3 results) No results for input(s): PROBNP in the last 8760 hours. HbA1C: No results for input(s): HGBA1C in the last 72 hours. CBG: Recent Labs  Lab 03/01/19 1640 03/01/19 2022 03/02/19 0022 03/02/19 0428 03/02/19 0737  GLUCAP 103* 112* 104* 92 114*   Lipid Profile: No results for input(s): CHOL, HDL, LDLCALC, TRIG, CHOLHDL, LDLDIRECT in the last 72  hours. Thyroid Function Tests: No results for input(s): TSH, T4TOTAL, FREET4, T3FREE, THYROIDAB in the last 72 hours. Anemia Panel: No results for input(s): VITAMINB12, FOLATE, FERRITIN, TIBC, IRON, RETICCTPCT in the last 72 hours. Urine analysis:    Component Value Date/Time   COLORURINE YELLOW 02/23/2019 1720   APPEARANCEUR HAZY (A) 02/23/2019 1720   LABSPEC 1.016 02/23/2019 1720   PHURINE 6.0 02/23/2019 1720   GLUCOSEU NEGATIVE 02/23/2019 1720   HGBUR NEGATIVE 02/23/2019 1720   BILIRUBINUR NEGATIVE 02/23/2019 1720   KETONESUR NEGATIVE 02/23/2019 1720   PROTEINUR 30 (A) 02/23/2019 1720   UROBILINOGEN 0.2 03/22/2014 1206   NITRITE NEGATIVE 02/23/2019 1720   LEUKOCYTESUR NEGATIVE 02/23/2019 1720   Sepsis Labs: '@LABRCNTIP'$ (procalcitonin:4,lacticidven:4)  ) Recent Results (from the past 240 hour(s))  Culture, blood (routine x 2)     Status: None   Collection Time: 02/23/19  4:26 PM   Specimen: BLOOD  Result Value Ref Range Status   Specimen Description BLOOD LEFT ANTECUBITAL  Final   Special Requests   Final    BOTTLES DRAWN AEROBIC AND ANAEROBIC Blood Culture adequate volume   Culture NO GROWTH 5 DAYS  Final   Report Status 02/28/2019 FINAL  Final  Respiratory Panel by RT PCR (Flu A&B, Covid) - Nasopharyngeal Swab     Status: None   Collection Time: 02/23/19  4:27 PM   Specimen: Nasopharyngeal Swab  Result Value Ref Range Status   SARS Coronavirus 2 by RT PCR NEGATIVE NEGATIVE Final    Comment: (NOTE) SARS-CoV-2 target nucleic acids are NOT DETECTED. The SARS-CoV-2 RNA is generally detectable in upper respiratoy specimens during the acute phase of infection. The lowest concentration of SARS-CoV-2 viral copies this assay can detect is 131 copies/mL. A negative result does not preclude SARS-Cov-2 infection and should not be used as the sole basis for treatment or other patient management decisions. A negative result may occur with  improper specimen collection/handling,  submission of specimen other than nasopharyngeal swab, presence of viral mutation(s) within the areas targeted by this assay, and inadequate number of viral copies (<131 copies/mL). A negative result must be combined with clinical observations, patient history, and epidemiological information. The expected  result is Negative. Fact Sheet for Patients:  PinkCheek.be Fact Sheet for Healthcare Providers:  GravelBags.it This test is not yet ap proved or cleared by the [place] FDA and  has been authorized for detection and/or diagnosis of SARS-CoV-2 by FDA under an Emergency Use Authorization (EUA). This EUA will remain  in effect (meaning this test can be used) for the duration of the COVID-19 declaration under Section 564(b)(1) of the Act, 21 U.S.C. section 360bbb-3(b)(1), unless the authorization is terminated or revoked sooner.    Influenza A by PCR NEGATIVE NEGATIVE Final   Influenza B by PCR NEGATIVE NEGATIVE Final    Comment: (NOTE) The Xpert Xpress SARS-CoV-2/FLU/RSV assay is intended as an aid in  the diagnosis of influenza from Nasopharyngeal swab specimens and  should not be used as a sole basis for treatment. Nasal washings and  aspirates are unacceptable for Xpert Xpress SARS-CoV-2/FLU/RSV  testing. Fact Sheet for Patients: PinkCheek.be Fact Sheet for Healthcare Providers: GravelBags.it This test is not yet approved or cleared by the [place] FDA and  has been authorized for detection and/or diagnosis of SARS-CoV-2 by  FDA under an Emergency Use Authorization (EUA). This EUA will remain  in effect (meaning this test can be used) for the duration of the  Covid-19 declaration under Section 564(b)(1) of the Act, 21  U.S.C. section 360bbb-3(b)(1), unless the authorization is  terminated or revoked. Performed at [facility], [place] [address],  [place], [place] [address]   Culture, blood (routine x 2)     Status: None   Collection Time: 02/23/19  4:28 PM   Specimen: BLOOD  Result Value Ref Range Status   Specimen Description BLOOD RIGHT ANTECUBITAL  Final   Special Requests   Final    BOTTLES DRAWN AEROBIC AND ANAEROBIC Blood Culture adequate volume   Culture NO GROWTH 5 DAYS  Final   Report Status 02/28/2019 FINAL  Final  MRSA PCR Screening     Status: None   Collection Time: 02/23/19  8:49 PM   Specimen: Nasal Mucosa; Nasopharyngeal  Result Value Ref Range Status   MRSA by PCR NEGATIVE NEGATIVE Final    Comment:        The GeneXpert MRSA Assay (FDA approved for NASAL specimens only), is one component of a comprehensive MRSA colonization surveillance program. It is not intended to diagnose MRSA infection nor to guide or monitor treatment for MRSA infections. Performed at [facility], [place] [address] [place], [place] [address]   Urine culture     Status: Abnormal   Collection Time: 02/23/19 10:34 PM   Specimen: Urine, Catheterized  Result Value Ref Range Status   Specimen Description URINE, CATHETERIZED  Final   Special Requests   Final    NONE Performed at [facility], [place] [address], [place], [place] [address]    Culture (A)  Final    >=100,000 COLONIES/mL ESCHERICHIA COLI Confirmed Extended Spectrum Beta-Lactamase Producer (ESBL).  In bloodstream infections from ESBL organisms, carbapenems are preferred over piperacillin/tazobactam. They are shown to have a lower risk of mortality.    Report Status 02/26/2019 FINAL  Final   Organism ID, Bacteria ESCHERICHIA COLI (A)  Final      Susceptibility   Escherichia coli - MIC*    AMPICILLIN >=32 RESISTANT Resistant     CEFAZOLIN >=64 RESISTANT Resistant     CEFTRIAXONE >=64 RESISTANT Resistant     CIPROFLOXACIN 1 SENSITIVE Sensitive     GENTAMICIN >=16 RESISTANT  Resistant     IMIPENEM <=0.25 SENSITIVE Sensitive     NITROFURANTOIN <=16 SENSITIVE  Sensitive     TRIMETH/SULFA >=320 RESISTANT Resistant     AMPICILLIN/SULBACTAM >=32 RESISTANT Resistant     PIP/TAZO <=4 SENSITIVE Sensitive     * >=100,000 COLONIES/mL ESCHERICHIA COLI  Culture, respiratory (non-expectorated)     Status: None   Collection Time: 02/24/19  4:29 AM   Specimen: Tracheal Aspirate; Respiratory  Result Value Ref Range Status   Specimen Description TRACHEAL ASPIRATE  Final   Special Requests NONE  Final   Gram Stain   Final    ABUNDANT WBC PRESENT, PREDOMINANTLY PMN ABUNDANT GRAM POSITIVE COCCI IN CLUSTERS FEW GRAM NEGATIVE RODS    Culture   Final    Consistent with normal respiratory flora. Performed at [facility], [place] [address] [place], [place] [address]    Report Status 02/26/2019 FINAL  Final      Studies: No results found.  Scheduled Meds: . amLODipine  10 mg Per Tube Daily  . carbamazepine  300 mg Per Tube BID  . chlorhexidine gluconate (MEDLINE KIT)  15 mL Mouth Rinse BID  . clonazepam  0.25 mg Per Tube BID  . clopidogrel  75 mg Per Tube Daily  . folic acid  1 mg Per Tube Daily  . free water  135 mL Per Tube Q6H  . heparin  5,000 Units Subcutaneous Q8H  . hydrALAZINE  10 mg Per Tube Q8H  . lacosamide  300 mg Per Tube BID  . levETIRAcetam  1,500 mg Per Tube BID  . mouth rinse  15 mL Mouth Rinse 10 times per day  . pantoprazole (PROTONIX) IV  40 mg Intravenous QHS  . simvastatin  10 mg Per Tube q1800  . thiamine  100 mg Per Tube Daily  . valproic acid  750 mg Per Tube Q8H    Continuous Infusions: . sodium chloride Stopped (02/26/19 1335)  . feeding supplement (OSMOLITE 1.2 CAL) 1,000 mL (03/01/19 1116)  . piperacillin-tazobactam (ZOSYN)  IV 3.375 g (03/02/19 0653)     LOS: 7 days     Kayleen Memos, MD [facility] Pager [phone]  If 7PM-7AM, please contact night-coverage [email or web address] Password [facility] 03/02/2019, 9:28 AM

## 2019-03-02 NOTE — Progress Notes (Signed)
Speech Language Pathology Treatment: Dysphagia  Patient Details Name: Lee [name] MRN: [id number] DOB: 15-Jan-1950 Today's Date: 03/02/2019 Time: 6213-0865 SLP Time Calculation (min) (ACUTE ONLY): 18 min  Assessment / Plan / Recommendation Clinical Impression  Pt received in room, sitting upright in bed, HOB raised. Ng placed. Per tele, oxygen saturation 94-96% throughout session, RR in the mid-high teens. Patient with eyes closed, did not open at any point during this session despite max verbal/tactile cues. He was sleeping upon arrival, but ST was able to rouse. ST completed oral care with toothbrush via in line suction and used suction to remove white, stringy secretions from oral cavity. Pt with a couple of teeth on bottom, otherwise edentulous. Pt noted to spontaneously swallow secretions x2 during this session. He did not follow commands to dry swallow and ST was not able to elicit a cough. ST used thermal-tactile stimulation to target increased alertness (damp washcloth), patient stating "get that away from my face." Voice quality noted to be wet sounding.  Pt seen with ice chips: discoordinated labial acceptance, able to orally grasp, allowing it to melt on central sulcus of tongue. In one instance, patient needed a verbal cue to initiate AP transport/initiate a swallow. Suspected delayed swallow initiation. Pt spontaneously initiated a swallow with other (4) ice chip trials, no overt s/sx aspiration.  Pt seen with spoon sips of thin liquid (water), mild anterior labial spillage noted. Pt with oral holding, limited bolus manipulation (likely due to reduced bolus awareness), eventually attempting a swallow. Suspected delayed swallow initiation, immediate weak cough x1. No further PO trials as patient with decreasing alertness.   ST will follow for PO readiness/ability to participate in objective swallowing study. Recommend continue NPO with ice chips/very small sips of water okay following  thorough oral care, if patient is alert and sitting upright.  RN educated and verbalized understanding.   HPI HPI: 69 year old male presented from [facility] to emergency department by EMS with respiratory distress and hypoxemia. Per nursing staff at skilled nursing facility patient was found in respiratory distress with gurgling sounds and tachypnea.  Patient was last seen well during consumption of meal tray on reevaluation patient was found in respiratory distress.  Per report upon arrival EMS found patient to be very hypoxemic for which he was placed on nonrebreather.  Due to significant respiratory distress patient was intubated immediately on arrival to ED on 2/15 until 2/17. Apparently also having seizures and requiring ativan. He has been admitted before with seizures and signs of aspiration in clinical eval. MBS showed mild dysphagia. Recommended to consume mech soft and thin with small bites and sips.       SLP Plan  Continue with current plan of care       Recommendations  Diet recommendations: NPO Medication Administration: Via alternative means                Oral Care Recommendations: Oral care QID;Oral care prior to ice chip/H20 Follow up Recommendations: Skilled Nursing facility SLP Visit Diagnosis: Dysphagia, unspecified (R13.10) Plan: Continue with current plan of care       GO                Lee [name] 03/02/2019, 1:25 PM  Shella Spearing, M.Ed., CCC-SLP Speech Therapy Acute Rehabilitation [phone]: Acute Rehab office [phone] - pager

## 2019-03-03 LAB — GLUCOSE, CAPILLARY
Glucose-Capillary: 103 mg/dL — ABNORMAL HIGH (ref 70–99)
Glucose-Capillary: 106 mg/dL — ABNORMAL HIGH (ref 70–99)
Glucose-Capillary: 107 mg/dL — ABNORMAL HIGH (ref 70–99)
Glucose-Capillary: 107 mg/dL — ABNORMAL HIGH (ref 70–99)
Glucose-Capillary: 115 mg/dL — ABNORMAL HIGH (ref 70–99)
Glucose-Capillary: 83 mg/dL (ref 70–99)
Glucose-Capillary: 86 mg/dL (ref 70–99)

## 2019-03-03 LAB — CBC WITH DIFFERENTIAL/PLATELET
Abs Immature Granulocytes: 0.08 10*3/uL — ABNORMAL HIGH (ref 0.00–0.07)
Basophils Absolute: 0 10*3/uL (ref 0.0–0.1)
Basophils Relative: 0 %
Eosinophils Absolute: 0.2 10*3/uL (ref 0.0–0.5)
Eosinophils Relative: 2 %
HCT: 38.7 % — ABNORMAL LOW (ref 39.0–52.0)
Hemoglobin: 13.1 g/dL (ref 13.0–17.0)
Immature Granulocytes: 1 %
Lymphocytes Relative: 32 %
Lymphs Abs: 2.6 10*3/uL (ref 0.7–4.0)
MCH: 32.5 pg (ref 26.0–34.0)
MCHC: 33.9 g/dL (ref 30.0–36.0)
MCV: 96 fL (ref 80.0–100.0)
Monocytes Absolute: 0.7 10*3/uL (ref 0.1–1.0)
Monocytes Relative: 9 %
Neutro Abs: 4.7 10*3/uL (ref 1.7–7.7)
Neutrophils Relative %: 56 %
Platelets: 424 10*3/uL — ABNORMAL HIGH (ref 150–400)
RBC: 4.03 MIL/uL — ABNORMAL LOW (ref 4.22–5.81)
RDW: 14.4 % (ref 11.5–15.5)
WBC: 8.3 10*3/uL (ref 4.0–10.5)
nRBC: 0 % (ref 0.0–0.2)

## 2019-03-03 MED ORDER — HYDRALAZINE HCL 25 MG PO TABS
25.0000 mg | ORAL_TABLET | Freq: Three times a day (TID) | ORAL | Status: DC
  Administered 2019-03-03 – 2019-03-06 (×8): 25 mg
  Filled 2019-03-03 (×9): qty 1

## 2019-03-03 NOTE — Progress Notes (Signed)
Physical Therapy Treatment Patient Details Name: Lee [name] MRN: [id number] DOB: Jul 13, 1950 Today's Date: 03/03/2019    History of Present Illness Patient is a 69 year old male admitted from [facility] due to aspiration. Patient intubated when arrived, extubated 02/26/19.  Chest xray shows " right lower lobe infiltrates and right middle lobe infiltrates.  Also some infiltrates noted at left lung".  EEG negative for seizure activity. He has resided at [facility] since 2017 due to dementia. PMH includes: dementia, seizure, CVA, COPD.     PT Comments    Pt unable to participate and required total assist of 2 to roll and sit EOB.  Noted pt with limited ankle ROM and resisted ROM.  Called and spoke with staff at [facility] - pt is long term resident and [facility].  Pt is at his baseline.  Will sign off PT.     Follow Up Recommendations  SNF     Equipment Recommendations  None recommended by PT    Recommendations for Other Services       Precautions / Restrictions Precautions Precautions: Fall Precaution Comments: NG tube    Mobility  Bed Mobility Overal bed mobility: Needs Assistance Bed Mobility: Sidelying to Sit;Sit to Sidelying   Sidelying to sit: Total assist;+2 for physical assistance     Sit to sidelying: Total assist;+2 for physical assistance General bed mobility comments: patient requires total assist for bed mobility. Unable to maintain sitting without external support  Transfers                 General transfer comment: not attempted  Ambulation/Gait                 Stairs             Wheelchair Mobility    Modified Rankin (Stroke Patients Only)       Balance Overall balance assessment: Needs assistance Sitting-balance support: No upper extremity supported;Feet supported Sitting balance-Leahy Scale: Zero Sitting balance - Comments: posterior lean and requires total assist from external support                                     Cognition Arousal/Alertness: Lethargic Behavior During Therapy: Flat affect Overall Cognitive Status: Difficult to assess                                 General Comments: no verbalizations during session, able to sqeeze hands on command only       Exercises General Exercises - Lower Extremity Ankle Circles/Pumps: PROM;Both;5 reps(limited ROM) Long Arc Quad: PROM;Both;5 reps    General Comments General comments (skin integrity, edema, etc.): Noted pt with plantarflexor contractures and increased tone/stiffness throughout.  He was not able to assist with transfers and moaned with rolling and extremity ROM.  Per eval pt's wife reported he could walk some but that may have been a year ago.  Called and spoke with staff at [facility] who report pt is [facility] for all mobility and ADL.s      Pertinent Vitals/Pain Pain Assessment: Faces Faces Pain Scale: Hurts little more Pain Location: unknown- with movement Pain Descriptors / Indicators: Grimacing;Moaning Pain Intervention(s): Monitored during session    Home Living                      Prior Function  PT Goals (current goals can now be found in the care plan section) Progress towards PT goals: Not progressing toward goals - comment(Pt is total [facility] at baseline -will d/c PT)    Frequency           PT Plan Discharge plan needs to be updated;Other (comment)(Discharge PT - spoke with staff at [facility] - pt is long term resident and total care baseline)    Co-evaluation PT/OT/SLP Co-Evaluation/Treatment: Yes Reason for Co-Treatment: For patient/therapist safety PT goals addressed during session: Mobility/safety with mobility OT goals addressed during session: ADL's and self-care      AM-PAC PT "6 Clicks" Mobility   Outcome Measure  Help needed turning from your back to your side while in a flat bed without using bedrails?: Total Help needed moving from lying on your back to  sitting on the side of a flat bed without using bedrails?: Total Help needed moving to and from a bed to a chair (including a wheelchair)?: Total Help needed standing up from a chair using your arms (e.g., wheelchair or bedside chair)?: Total Help needed to walk in hospital room?: Total Help needed climbing 3-5 steps with a railing? : Total 6 Click Score: 6    End of Session   Activity Tolerance: Patient limited by lethargy(and ability to follow commands) Patient left: in bed;with call bell/phone within reach;with bed alarm set Nurse Communication: Mobility status       Time: 8003-4917 PT Time Calculation (min) (ACUTE ONLY): 25 min  Charges:  $Therapeutic Activity: 8-22 mins                     Royetta Asal, PT Acute Rehab Services Pager [phone] [facility] [phone] [facility] [phone]    Rayetta Humphrey 03/03/2019, 1:49 PM

## 2019-03-03 NOTE — Progress Notes (Signed)
Occupational Therapy Treatment + Discharge Patient Details Name: Lee [name] MRN: [id number] DOB: 1950-02-27 Today's Date: 03/03/2019    History of present illness Patient is a 69 year old male admitted from [facility] due to aspiration. Patient intubated when arrived, extubated 02/26/19.  Chest xray shows " right lower lobe infiltrates and right middle lobe infiltrates.  Also some infiltrates noted at left lung".  EEG negative for seizure activity. He has resided at [facility] since 2017 due to dementia. PMH includes: dementia, seizure, CVA, COPD.    OT comments  Pt not progressing with mobility or ADL. Pt unable to follow commands other than b/l hand squeezes. Pt totalA for all aspects of care at SNF/[facility] per phone call with PT and staff. Pt with rigid posture and totalA for bed mobility and pt not initiating any movements in BUEs for purposeful movement. Pt totalA for ADL tasks - pt unable to grasp washcloth for hand over hand technique. Pt moaning in pain with any movement. Pt does not require continued OT skilled services. OT signing off.    Follow Up Recommendations  SNF;Supervision/Assistance - 24 hour(return to [facility])    Equipment Recommendations  None recommended by OT    Recommendations for Other Services      Precautions / Restrictions Precautions Precautions: Fall Precaution Comments: NG tube       Mobility Bed Mobility Overal bed mobility: Needs Assistance Bed Mobility: Sidelying to Sit;Sit to Sidelying   Sidelying to sit: Total assist;+2 for physical assistance     Sit to sidelying: Total assist;+2 for physical assistance General bed mobility comments: patient requires total assist for bed mobility. Unable to maintain sitting without external support  Transfers                 General transfer comment: not attempted; staff to use lift    Balance Overall balance assessment: Needs assistance Sitting-balance support: No upper extremity  supported;Feet supported Sitting balance-Leahy Scale: Zero Sitting balance - Comments: posterior lean and requires total assist from external support                                   ADL either performed or assessed with clinical judgement   ADL Overall ADL's : At baseline                                     Functional mobility during ADLs: Total assistance;+2 for physical assistance General ADL Comments: total assist for all self care at this time      Vision   Vision Assessment?: Vision impaired- to be further tested in functional context Additional Comments: pt able to scan slightly   Perception     Praxis      Cognition Arousal/Alertness: Lethargic Behavior During Therapy: Flat affect Overall Cognitive Status: Difficult to assess                                 General Comments: no verbalizations during session, able to sqeeze hands on command only         Exercises General Exercises - Lower Extremity Ankle Circles/Pumps: PROM;Both;5 reps(limited ROM) Long Arc Quad: PROM;Both;5 reps   Shoulder Instructions       General Comments PT called [facility] for PLOF. Pt was totalA  for all mobility and ADL. Pt with tightness and rigidity at baseline.     Pertinent Vitals/ Pain       Pain Assessment: Faces Faces Pain Scale: Hurts little more Pain Location: with any movement of BUEs/BLEs Pain Descriptors / Indicators: Grimacing;Moaning Pain Intervention(s): Limited activity within patient's tolerance;Monitored during session  Home Living                                          Prior Functioning/Environment              Frequency           Progress Toward Goals  OT Goals(current goals can now be found in the care plan section)  Progress towards OT goals: Not progressing toward goals - comment(Pt d/c today due to current level at SNF is total care.)  Acute Rehab OT Goals Patient Stated  Goal: unable to state  Plan All goals met and education completed, patient discharged from OT services;Discharge plan needs to be updated    Co-evaluation    PT/OT/SLP Co-Evaluation/Treatment: Yes Reason for Co-Treatment: Complexity of the patient's impairments (multi-system involvement);For patient/therapist safety PT goals addressed during session: Mobility/safety with mobility OT goals addressed during session: ADL's and self-care      AM-PAC OT "6 Clicks" Daily Activity     Outcome Measure   Help from another person eating meals?: Total Help from another person taking care of personal grooming?: Total Help from another person toileting, which includes using toliet, bedpan, or urinal?: Total Help from another person bathing (including washing, rinsing, drying)?: Total Help from another person to put on and taking off regular upper body clothing?: Total Help from another person to put on and taking off regular lower body clothing?: Total 6 Click Score: 6    End of Session    OT Visit Diagnosis: Muscle weakness (generalized) (M62.81);Cognitive communication deficit (R41.841);Pain;Adult, failure to thrive (R62.7) Symptoms and signs involving cognitive functions: Other Nontraumatic ICH Pain - part of body: ("all over")   Activity Tolerance Patient limited by lethargy   Patient Left in bed;with call bell/phone within reach;with bed alarm set;with restraints reapplied;with family/visitor present;with SCD's reapplied   Nurse Communication Mobility status        Time: 8657-8469 OT Time Calculation (min): 25 min  Charges: OT General Charges $OT Visit: 1 Visit OT Treatments $Therapeutic Activity: 8-22 mins  [name], OTR/L Acute Rehabilitation Services Pager: [phone] Office: [phone]    Truc Winfree C 03/03/2019, 4:36 PM

## 2019-03-03 NOTE — Progress Notes (Addendum)
Speech Language Pathology Treatment: Dysphagia  Patient Details Name: Lee [name] MRN: [id number] DOB: 11/04/1950 Today's Date: 03/03/2019 Time: 4098-1191 SLP Time Calculation (min) (ACUTE ONLY): 15 min  Assessment / Plan / Recommendation Clinical Impression  Pt more alert and participatory today. Nonverbal, not following commands, but accepted straw at lips with good recognition and ability to drink 4 oz water with no s/s of aspiration, good labial seal.  He consumed several bites of applesauce also with no overt difficulty, no s/s of aspiration.  He appears to be approaching baseline with regard to swallowing.    Lee [name]' swallowing is going to wax and wane, particularly with his dementia.  His dysphagia is chronic, has thus far been functional, but he is likely beginning to see an overall decline associated in part with his dementia.  Recommend D/Cing NG for comfort, start dysphagia 1, thin liquids (straws ok).  Position pt upright and feed or allow self-feeding as able. If pt does not show improvements in the next few days with PO intake, he and his wife may benefit from a palliative care consult.  D/W Dr. Margo Aye. SLP will follow.   HPI HPI: 69 year old male presented from [facility] to emergency department by EMS with respiratory distress and hypoxemia. Per nursing staff at skilled nursing facility patient was found in respiratory distress with gurgling sounds and tachypnea.  Patient was last seen well during consumption of meal tray on reevaluation patient was found in respiratory distress.  Per report upon arrival EMS found patient to be very hypoxemic for which he was placed on nonrebreather.  Due to significant respiratory distress patient was intubated immediately on arrival to ED on 2/15 until 2/17. Apparently also having seizures and requiring ativan. He has been admitted before with seizures and signs of aspiration in clinical eval. MBS showed mild dysphagia. Recommended to  consume mech soft and thin with small bites and sips.       SLP Plan  Continue with current plan of care       Recommendations  Diet recommendations: Dysphagia 1 (puree);Thin liquid Liquids provided via: Straw;Cup Medication Administration: Crushed with puree Supervision: Staff to assist with self feeding Compensations: Minimize environmental distractions Postural Changes and/or Swallow Maneuvers: Seated upright 90 degrees                Oral Care Recommendations: Oral care BID Follow up Recommendations: Skilled Nursing facility SLP Visit Diagnosis: Dysphagia, unspecified (R13.10) Plan: Continue with current plan of care       GO              Lee Abdelrahman L. Samson Frederic, MA CCC/SLP Acute Rehabilitation Services Office number [phone] Pager [phone]   [name] 03/03/2019, 4:46 PM

## 2019-03-03 NOTE — Progress Notes (Addendum)
Called the patient's spouse, Ms Elita Quick x 2 to discuss possibility of peg tube placement for dc planning if still unable to pass his swallow evaluation by speech therapist.  No answer.  Left a voicemail message.  Update: Was able to reach the patient's wife.  She is hesitant at this time to make decision for peg tube placement due to dissatisfaction with care at SNF.  She would like to try swallow evaluation again and be present if possible for the test.  Informed speech, Ms. Heaven Gouch.

## 2019-03-03 NOTE — Care Management Important Message (Signed)
Important Message  Patient Details  Name: Lee [name] MRN: [id number] Date of Birth: Dec 16, 1950   Medicare Important Message Given:  Yes     Leone Haven, RN 03/03/2019, 4:56 PM

## 2019-03-03 NOTE — Progress Notes (Signed)
PROGRESS NOTE  Lee [name] XBM:[id number] DOB: Apr 02, 1950 DOA: 02/23/2019 PCP: Iona Beard, MD  HPI/Recap of past 24 hours: Brodin Gelpi is a 69 year old male with a past medical history significant for seizures, stroke, COPD, dementia, and alcohol abuse who presented to the ED from [facility], in acute respiratory distress with hypoxemia.Per nursing staff at skilled nursing facility patient was found in respiratory distress with gurgling sounds and tachypnea. Patient was last seen well during consumption of meal tray, on reevaluation patient was found in respiratory distress.Per report upon arrival EMS found patient to be very hypoxemic for which he was placed on nonrebreather. Due to significant respiratory distress patient was intubated immediately on arrival to ED. Full CODE STATUS confirmed with patient's wife.  Admitted by PCCM.  Extubated on 2/18.  Chest x-ray 2/18 with findings worrisome for pneumonia.  Also suspected aspiration PNA.  Also treated for ESBL Ecoli UTI.  Was started on Meropenem which was switched to Zosyn due to concern for decreasing seizure threshold.  Looked to be seizing on 2/18 per PCCM provider.  EEG ordered and showed no active seizure activity.  On AEDs.   Transferred to progressive unit after extubation on 2/18.   TRH assumed care on 02/27/19.  Hospital course complicated by lethargy.  More alert on 2/21 and followed commands.  Alertness waxes and wanes.  2/23: Seen and examined.  Per PM bedside RN no acute events overnight.  He is minimally interactive this morning and has no new complaints.  Did not pass a swallow evaluation yesterday by speech therapy.  Will discuss possibility of PEG tube placement with his wife.  He is on day number 6 out of 7 of Zosyn for his ESBL E. coli UTI.    Assessment/Plan: Active Problems:   Acute respiratory failure (HCC)  Aspiration Pneumonia Sputum culture with normal flora.  Extubated on  02/26/2019. Independently reviewed chest x-ray done on 02/26/2019 and previous chest x-rays, it shows right lower lobe infiltrates and right middle lobe infiltrates.  Also some infiltrates noted at left lung. Core track in place with aspiration precautions also in place. Continue to maintain head of bed elevated >30 degrees while on tube feeding. O2 saturation 97% on room air.  Dysphagia post cortrack placement We will discuss possibility of PEG tube placement for DC planning with patient's wife Speech therapist following  Acute metabolic encephalopathy likely multifactorial in the setting of acute illness/ESBL E. coli UTI/pneumonia/AEDs side effect Minimally interactive.  Does not appear in distress. Continue to follow closely and reorient as needed  Resolved acute hypoxic respiratory failure secondary to HCAP versus aspiration pneumonia Intubated on admit Not on oxygen supplementation at baseline Extubated on 02/26/2019. O2 saturation 97% on room air currently.  ESBL E. coli UTI Urine culture positive for E. coli ESBL, sensitive to meropenem and Zosyn. Meropenem was discontinued 02/26/2019 due to suspected seizure activity. Continue IV Zosyn Day #6/7 of targeted IV antibiotic for ESBL E. Coli.  Received IV vancomycin and IV cefepime from 2/15>>2/18, not sensitive. Blood cx 2/15 negative FINAL.  Seizure disorder Suspected seizure activity on 02/26/2019 by PCCM provider EEG did not show active seizure activity Continue AEDs and avoid meropenem Continue seizure precaution Valproic acid level subtherapeutic 31 on 02/23/2019 Keppra and Tegretol therapeutic level  Hypertension, uncontrolled HLD -continue amlodipine (started this admit) via core track -Increase dose of hydralazine to 25 mg 3 times daily via tube -Continue home statin  via core track  Resolved refractory hypokalemia Potassium 3.4>> 3.8>> 4.0. Repleted  Resolved refractory hypophosphatemia Phosphorus 1.7>>  2.8 Repleted  COPD  -PRN albuterol    History of EtOH abuse This appears to be remote use as he lives in a SNF, doubt risk for withdrawal  -continue thiamine, folate -supportive care   Dementia -supportive care -delirium prevention measures   PVD Concern for cool left foot, has known peripheral disease.  Seen by VVS, has pulses by doppler. Deemed not a surgical candidate.   Ambulatory dysfunction PT OT assessed on 2/19 and recommended SNF TOC assisting with placement  Best practice:  Diet: NPO Pain/Anxiety/Delirium protocol (if indicated): n/a  VAP protocol (if indicated): n/a DVT prophylaxis: SCD's, subcu heparin 3 times daily. GI prophylaxis: Protonix Glucose control: Monitor Mobility: PT OT to assess Code Status: Full code    Disposition:  Patient is from SNF.  Anticipate discharge to SNF.  Barrier to discharge ongoing treatment for ESBL E. coli UTI, day number 6 out of 7 of ESBL E. coli targeted IV antibiotic, acute metabolic encephalopathy, SNF placement.     Objective: Vitals:   03/03/19 0425 03/03/19 0426 03/03/19 0427 03/03/19 0804  BP:  (!) 156/73  (!) 167/81  Pulse:    66  Resp: (!) 26 (!) 24 (!) 24   Temp:    98.6 F (37 C)  TempSrc:    Oral  SpO2: 96% 96% 95%   Weight:      Height:        Intake/Output Summary (Last 24 hours) at 03/03/2019 0858 Last data filed at 03/03/2019 0856 Gross per 24 hour  Intake 0 ml  Output 1227 ml  Net -1227 ml   Filed Weights   03/01/19 0430 03/02/19 0023 03/03/19 0241  Weight: 62.3 kg 63.5 kg 63.7 kg    Exam:  . General: 69 y.o. year-old male well nourished in no acute distress.  Minimally interactive.   . Cardiovascular: Regular rate and rhythm no rubs or gallops.   [name] Respiratory: Mild rales at bases no wheezing noted.  Poor inspiratory effort.  . Abdomen: Nontender bowel sounds present. . Musculoskeletal: No lower extremity edema bilaterally.   [name] Psychiatry: Mood is appropriate for condition and  setting..    Data Reviewed: CBC: Recent Labs  Lab 02/27/19 0442 02/28/19 0500 03/01/19 0353 03/02/19 0240 03/03/19 0508  WBC 7.8 8.7 8.8 8.6 8.3  NEUTROABS 5.3 4.9 5.3 4.9 4.7  HGB 12.6* 12.2* 13.8 12.8* 13.1  HCT 37.9* 36.9* 40.7 38.0* 38.7*  MCV 96.9 97.9 95.3 96.9 96.0  PLT 280 301 355 388 109*   Basic Metabolic Panel: Recent Labs  Lab 02/25/19 1015 02/26/19 0332 02/26/19 1218 02/26/19 1709 02/27/19 0442 02/27/19 1638 02/28/19 0500 03/01/19 0353 03/02/19 0240  NA  --  144  --   --  142  --  140 139 138  K  --  3.9  --   --  3.4*  --  3.4* 3.8 4.0  CL  --  107  --   --  107  --  103 103 102  CO2  --  28  --   --  28  --  _0 GLUCOSE  --  104*  --   --  140*  --  116* 115* 123*  BUN  --  7*  --   --  11  --  _1 CREATININE  --  0.73  --   --  0.66  --  0.71 0.56* 0.54*  CALCIUM  --  8.8*  --   --  8.1*  --  7.8* 8.1* 8.1*  MG   < >  --  1.8 1.8 1.8 1.5* 1.6*  --   --   PHOS  --   --  2.5 2.1* 1.7* 2.8  --   --   --    < > = values in this interval not displayed.   GFR: Estimated Creatinine Clearance: 78.5 mL/min (A) (by C-G formula based on SCr of 0.54 mg/dL (L)). Liver Function Tests: No results for input(s): AST, ALT, ALKPHOS, BILITOT, PROT, ALBUMIN in the last 168 hours. No results for input(s): LIPASE, AMYLASE in the last 168 hours. No results for input(s): AMMONIA in the last 168 hours. Coagulation Profile: No results for input(s): INR, PROTIME in the last 168 hours. Cardiac Enzymes: No results for input(s): CKTOTAL, CKMB, CKMBINDEX, TROPONINI in the last 168 hours. BNP (last 3 results) No results for input(s): PROBNP in the last 8760 hours. HbA1C: No results for input(s): HGBA1C in the last 72 hours. CBG: Recent Labs  Lab 03/02/19 1646 03/02/19 1947 03/03/19 0021 03/03/19 0424 03/03/19 0729  GLUCAP 115* 107* 83 115* 86   Lipid Profile: No results for input(s): CHOL, HDL, LDLCALC, TRIG, CHOLHDL, LDLDIRECT in the last 72  hours. Thyroid Function Tests: No results for input(s): TSH, T4TOTAL, FREET4, T3FREE, THYROIDAB in the last 72 hours. Anemia Panel: No results for input(s): VITAMINB12, FOLATE, FERRITIN, TIBC, IRON, RETICCTPCT in the last 72 hours. Urine analysis:    Component Value Date/Time   COLORURINE YELLOW 02/23/2019 1720   APPEARANCEUR HAZY (A) 02/23/2019 1720   LABSPEC 1.016 02/23/2019 1720   PHURINE 6.0 02/23/2019 1720   GLUCOSEU NEGATIVE 02/23/2019 1720   HGBUR NEGATIVE 02/23/2019 1720   BILIRUBINUR NEGATIVE 02/23/2019 1720   KETONESUR NEGATIVE 02/23/2019 1720   PROTEINUR 30 (A) 02/23/2019 1720   UROBILINOGEN 0.2 03/22/2014 1206   NITRITE NEGATIVE 02/23/2019 1720   LEUKOCYTESUR NEGATIVE 02/23/2019 1720   Sepsis Labs: _0 (procalcitonin:4,lacticidven:4)  ) Recent Results (from the past 240 hour(s))  Culture, blood (routine x 2)     Status: None   Collection Time: 02/23/19  4:26 PM   Specimen: BLOOD  Result Value Ref Range Status   Specimen Description BLOOD LEFT ANTECUBITAL  Final   Special Requests   Final    BOTTLES DRAWN AEROBIC AND ANAEROBIC Blood Culture adequate volume   Culture NO GROWTH 5 DAYS  Final   Report Status 02/28/2019 FINAL  Final  Respiratory Panel by RT PCR (Flu A&B, Covid) - Nasopharyngeal Swab     Status: None   Collection Time: 02/23/19  4:27 PM   Specimen: Nasopharyngeal Swab  Result Value Ref Range Status   SARS Coronavirus 2 by RT PCR NEGATIVE NEGATIVE Final    Comment: (NOTE) SARS-CoV-2 target nucleic acids are NOT DETECTED. The SARS-CoV-2 RNA is generally detectable in upper respiratoy specimens during the acute phase of infection. The lowest concentration of SARS-CoV-2 viral copies this assay can detect is 131 copies/mL. A negative result does not preclude SARS-Cov-2 infection and should not be used as the sole basis for treatment or other patient management decisions. A negative result may occur with  improper specimen collection/handling,  submission of specimen other than nasopharyngeal swab, presence of viral mutation(s) within the areas targeted by this assay, and inadequate number of viral copies (<131 copies/mL). A negative result must be combined with clinical observations, patient history, and epidemiological information. The expected result is Negative. Fact Sheet for Patients:  PinkCheek.be Fact Sheet for Healthcare Providers:  GravelBags.it  This test is not yet ap proved or cleared by the [place] and  has been authorized for detection and/or diagnosis of SARS-CoV-2 by FDA under an Emergency Use Authorization (EUA). This EUA will remain  in effect (meaning this test can be used) for the duration of the COVID-19 declaration under Section 564(b)(1) of the Act, 21 U.S.C. section 360bbb-3(b)(1), unless the authorization is terminated or revoked sooner.    Influenza A by PCR NEGATIVE NEGATIVE Final   Influenza B by PCR NEGATIVE NEGATIVE Final    Comment: (NOTE) The Xpert Xpress SARS-CoV-2/FLU/RSV assay is intended as an aid in  the diagnosis of influenza from Nasopharyngeal swab specimens and  should not be used as a sole basis for treatment. Nasal washings and  aspirates are unacceptable for Xpert Xpress SARS-CoV-2/FLU/RSV  testing. Fact Sheet for Patients: PinkCheek.be Fact Sheet for Healthcare Providers: GravelBags.it This test is not yet approved or cleared by the [place] FDA and  has been authorized for detection and/or diagnosis of SARS-CoV-2 by  FDA under an Emergency Use Authorization (EUA). This EUA will remain  in effect (meaning this test can be used) for the duration of the  Covid-19 declaration under Section 564(b)(1) of the Act, 21  U.S.C. section 360bbb-3(b)(1), unless the authorization is  terminated or revoked. Performed at [facility], [place] [address]  [place], [place] [address]   Culture, blood (routine x 2)     Status: None   Collection Time: 02/23/19  4:28 PM   Specimen: BLOOD  Result Value Ref Range Status   Specimen Description BLOOD RIGHT ANTECUBITAL  Final   Special Requests   Final    BOTTLES DRAWN AEROBIC AND ANAEROBIC Blood Culture adequate volume   Culture NO GROWTH 5 DAYS  Final   Report Status 02/28/2019 FINAL  Final  MRSA PCR Screening     Status: None   Collection Time: 02/23/19  8:49 PM   Specimen: Nasal Mucosa; Nasopharyngeal  Result Value Ref Range Status   MRSA by PCR NEGATIVE NEGATIVE Final    Comment:        The GeneXpert MRSA Assay (FDA approved for NASAL specimens only), is one component of a comprehensive MRSA colonization surveillance program. It is not intended to diagnose MRSA infection nor to guide or monitor treatment for MRSA infections. Performed at [facility], [place] [address] [place], [place] [address]   Urine culture     Status: Abnormal   Collection Time: 02/23/19 10:34 PM   Specimen: Urine, Catheterized  Result Value Ref Range Status   Specimen Description URINE, CATHETERIZED  Final   Special Requests   Final    NONE Performed at [facility], [place] [address], [place], [place] [address]    Culture (A)  Final    >=100,000 COLONIES/mL ESCHERICHIA COLI Confirmed Extended Spectrum Beta-Lactamase Producer (ESBL).  In bloodstream infections from ESBL organisms, carbapenems are preferred over piperacillin/tazobactam. They are shown to have a lower risk of mortality.    Report Status 02/26/2019 FINAL  Final   Organism ID, Bacteria ESCHERICHIA COLI (A)  Final      Susceptibility   Escherichia coli - MIC*    AMPICILLIN >=32 RESISTANT Resistant     CEFAZOLIN >=64 RESISTANT Resistant     CEFTRIAXONE >=64 RESISTANT Resistant     CIPROFLOXACIN 1 SENSITIVE Sensitive     GENTAMICIN >=16 RESISTANT Resistant     IMIPENEM <=0.25 SENSITIVE Sensitive     NITROFURANTOIN <=16 SENSITIVE  Sensitive     TRIMETH/SULFA >=320 RESISTANT Resistant     AMPICILLIN/SULBACTAM >=32 RESISTANT Resistant     PIP/TAZO <=4 SENSITIVE Sensitive     * >=100,000 COLONIES/mL ESCHERICHIA COLI  Culture, respiratory (non-expectorated)     Status: None   Collection Time: 02/24/19  4:29 AM   Specimen: Tracheal Aspirate; Respiratory  Result Value Ref Range Status   Specimen Description TRACHEAL ASPIRATE  Final   Special Requests NONE  Final   Gram Stain   Final    ABUNDANT WBC PRESENT, PREDOMINANTLY PMN ABUNDANT GRAM POSITIVE COCCI IN CLUSTERS FEW GRAM NEGATIVE RODS    Culture   Final    Consistent with normal respiratory flora. Performed at [facility], [place] [address] [place], [place] [address]    Report Status 02/26/2019 FINAL  Final      Studies: No results found.  Scheduled Meds: . amLODipine  10 mg Per Tube Daily  . carbamazepine  300 mg Per Tube BID  . chlorhexidine gluconate (MEDLINE KIT)  15 mL Mouth Rinse BID  . clonazepam  0.25 mg Per Tube BID  . clopidogrel  75 mg Per Tube Daily  . folic acid  1 mg Per Tube Daily  . free water  135 mL Per Tube Q6H  . heparin  5,000 Units Subcutaneous Q8H  . hydrALAZINE  10 mg Per Tube Q8H  . lacosamide  300 mg Per Tube BID  . levETIRAcetam  1,500 mg Per Tube BID  . mouth rinse  15 mL Mouth Rinse 10 times per day  . pantoprazole (PROTONIX) IV  40 mg Intravenous QHS  . simvastatin  10 mg Per Tube q1800  . thiamine  100 mg Per Tube Daily  . valproic acid  750 mg Per Tube Q8H    Continuous Infusions: . sodium chloride Stopped (02/26/19 1335)  . feeding supplement (OSMOLITE 1.2 CAL) 1,000 mL (03/01/19 1116)  . piperacillin-tazobactam (ZOSYN)  IV 3.375 g (03/03/19 0715)     LOS: 8 days     Kayleen Memos, MD [facility] Pager [phone]  If 7PM-7AM, please contact night-coverage [email or web address] Password [facility] 03/03/2019, 8:58 AM

## 2019-03-04 ENCOUNTER — Inpatient Hospital Stay (HOSPITAL_COMMUNITY): Payer: Medicare Other

## 2019-03-04 DIAGNOSIS — Z4659 Encounter for fitting and adjustment of other gastrointestinal appliance and device: Secondary | ICD-10-CM

## 2019-03-04 DIAGNOSIS — E43 Unspecified severe protein-calorie malnutrition: Secondary | ICD-10-CM

## 2019-03-04 LAB — CBC WITH DIFFERENTIAL/PLATELET
Abs Immature Granulocytes: 0.1 10*3/uL — ABNORMAL HIGH (ref 0.00–0.07)
Basophils Absolute: 0 10*3/uL (ref 0.0–0.1)
Basophils Relative: 0 %
Eosinophils Absolute: 0 10*3/uL (ref 0.0–0.5)
Eosinophils Relative: 0 %
HCT: 37.3 % — ABNORMAL LOW (ref 39.0–52.0)
Hemoglobin: 12.7 g/dL — ABNORMAL LOW (ref 13.0–17.0)
Immature Granulocytes: 1 %
Lymphocytes Relative: 12 %
Lymphs Abs: 1.1 10*3/uL (ref 0.7–4.0)
MCH: 32.6 pg (ref 26.0–34.0)
MCHC: 34 g/dL (ref 30.0–36.0)
MCV: 95.6 fL (ref 80.0–100.0)
Monocytes Absolute: 0.1 10*3/uL (ref 0.1–1.0)
Monocytes Relative: 1 %
Neutro Abs: 8.3 10*3/uL — ABNORMAL HIGH (ref 1.7–7.7)
Neutrophils Relative %: 86 %
Platelets: 466 10*3/uL — ABNORMAL HIGH (ref 150–400)
RBC: 3.9 MIL/uL — ABNORMAL LOW (ref 4.22–5.81)
RDW: 14.6 % (ref 11.5–15.5)
WBC: 9.7 10*3/uL (ref 4.0–10.5)
nRBC: 0 % (ref 0.0–0.2)

## 2019-03-04 LAB — GLUCOSE, CAPILLARY
Glucose-Capillary: 103 mg/dL — ABNORMAL HIGH (ref 70–99)
Glucose-Capillary: 112 mg/dL — ABNORMAL HIGH (ref 70–99)
Glucose-Capillary: 114 mg/dL — ABNORMAL HIGH (ref 70–99)
Glucose-Capillary: 121 mg/dL — ABNORMAL HIGH (ref 70–99)
Glucose-Capillary: 86 mg/dL (ref 70–99)

## 2019-03-04 MED ORDER — ADULT MULTIVITAMIN W/MINERALS CH
1.0000 | ORAL_TABLET | Freq: Every day | ORAL | Status: DC
  Administered 2019-03-04 – 2019-03-06 (×3): 1 via ORAL
  Filled 2019-03-04 (×3): qty 1

## 2019-03-04 MED ORDER — ENSURE ENLIVE PO LIQD
237.0000 mL | Freq: Three times a day (TID) | ORAL | Status: DC
  Administered 2019-03-04 – 2019-03-05 (×3): 237 mL via ORAL

## 2019-03-04 NOTE — Progress Notes (Signed)
Nutrition Follow-up  DOCUMENTATION CODES:   Severe malnutrition in context of chronic illness  INTERVENTION:   -Ensure Enlive po TID, each supplement provides 350 kcal and 20 grams of protein -MVI with minerals daily -Magic cup TID with meals, each supplement provides 290 kcal and 9 grams of protein  NUTRITION DIAGNOSIS:   Severe Malnutrition related to chronic illness(COPD, CVA, dementia) as evidenced by moderate muscle depletion, severe muscle depletion, moderate fat depletion, severe fat depletion.  Ongoing  GOAL:   Patient will meet greater than or equal to 90% of their needs  Progressing   MONITOR:   PO intake, Supplement acceptance, Diet advancement, Labs, Weight trends, Skin, I & O's  REASON FOR ASSESSMENT:   Consult Enteral/tube feeding initiation and management  ASSESSMENT:   69 yo male admitted with acute respiratory failure. PMH includes HTN, PVD, COPD, stroke, ETOH abuse, dementia, siezures. Noted allergy to orange juice.  2/17- extubated 2/18- TF initiated 2/23- s/p BSE- advanced to dysphagia 1 diet with thin liquids 2/24- s/p BSE_ advanced to dysphagia 2 diet with thin liquids  Reviewed I/O's: +132 ml x 24 hours and +1.3 L since admission  UOP: 176 ml x 24 hours  Case discussed with RN, who reports NGT was removed yesterday and TF has been d/c. Pt with very good appetite, consuming 100% of his breakfast per RN.   Labs reviewed: CBGS: 86-112.   Diet Order:   Diet Order            DIET DYS 2 Room service appropriate? Yes; Fluid consistency: Thin  Diet effective now              EDUCATION NEEDS:   Education needs have been addressed  Skin:  Skin Assessment: Skin Integrity Issues: Skin Integrity Issues:: Stage II, DTI DTI: lt heel Stage II: coccyx  Last BM:  03/03/19  Height:   Ht Readings from Last 1 Encounters:  02/26/19 5\' 10"  (1.778 m)    Weight:   Wt Readings from Last 1 Encounters:  03/04/19 63.5 kg    Ideal Body  Weight:  75.5 kg  BMI:  Body mass index is 20.09 kg/m.  Estimated Nutritional Needs:   Kcal:  2000-2200  Protein:  110-125 grams  Fluid:  > 2 L    03/06/19, RD, LDN, CDCES Registered Dietitian II Certified Diabetes Care and Education Specialist Please refer to [facility] for RD and/or RD on-call/weekend/after hours pager

## 2019-03-04 NOTE — Progress Notes (Signed)
Speech Language Pathology Treatment: Dysphagia  Patient Details Name: Lee [name] MRN: [id number] DOB: 1950-08-27 Today's Date: 03/04/2019 Time: 4098-1191 SLP Time Calculation (min) (ACUTE ONLY): 18 min  Assessment / Plan / Recommendation Clinical Impression  Pt was seen for dysphagia treatment to assess tolerance of the recommended diet and to evaluate his ability to tolerate more advanced consistencies. Nursing reported that he tolerated breakfast well and consumed the majority of his meal. He was alert throughout the session but did not communicate verbally. He tolerated puree solids, regular texture solids and consecutive swallows of thin via straw without overt s/sx of aspiration. Mastication time was mildly increased but no significant oral residue was noted. It is recommended that his current diet be upgraded to dysphagia 2 solids and that thin liquids be continued. SLP will contiue to follow pt.     HPI HPI: 69 year old male presented from [facility] to emergency department by EMS with respiratory distress and hypoxemia. Per nursing staff at skilled nursing facility patient was found in respiratory distress with gurgling sounds and tachypnea.  Patient was last seen well during consumption of meal tray on reevaluation patient was found in respiratory distress.  Per report upon arrival EMS found patient to be very hypoxemic for which he was placed on nonrebreather.  Due to significant respiratory distress patient was intubated immediately on arrival to ED on 2/15 until 2/17. Apparently also having seizures and requiring ativan. He has been admitted before with seizures and signs of aspiration in clinical eval. MBS showed mild dysphagia. Recommended to consume mech soft and thin with small bites and sips.       SLP Plan  Continue with current plan of care       Recommendations  Diet recommendations: Dysphagia 2 (fine chop);Thin liquid Liquids provided via: Straw;Cup Medication  Administration: Crushed with puree Supervision: Staff to assist with self feeding Compensations: Minimize environmental distractions Postural Changes and/or Swallow Maneuvers: Seated upright 90 degrees                Oral Care Recommendations: Oral care BID Follow up Recommendations: Skilled Nursing facility SLP Visit Diagnosis: Dysphagia, unspecified (R13.10) Plan: Continue with current plan of care       Francenia Chimenti I. Vear Clock, MS, CCC-SLP Acute Rehabilitation Services Office number [phone] Pager [phone]                Scheryl Marten 03/04/2019, 11:01 AM

## 2019-03-04 NOTE — Progress Notes (Signed)
To the best of my knowledge, the student's charting is accurate.  

## 2019-03-04 NOTE — Progress Notes (Addendum)
PROGRESS NOTE    Lee [name]  TKZ:[id number] DOB: 1950-01-21 DOA: 02/23/2019 PCP: Iona Beard, MD    Brief Narrative:  69 year old gentleman prior history of seizures, COPD, dementia, alcohol abuse, stroke presents from [facility] with shortness of breath and tachypnea.  Patient was intubated on arrival to ED and he was extubated on 02/26/2019.  Chest x-ray showed findings worrisome for pneumonia.  Hospital course complicated by ESBL U E. coli UTI.  Patient was transferred to [facility] on 02/27/2019.  SLP evaluation suggested patient has dysphagia and he was started on dysphagia 2 diet.  Assessment & Plan:   Active Problems:   Acute respiratory failure (HCC)   Protein-calorie malnutrition, severe   Acute respiratory failure with hypoxia secondary to aspiration pneumonia. Patient was intubated on admission and extubated on 02/26/2019.  Patient is currently on room air with good oxygen saturations. SLP evaluation recommended dysphagia 2 diet.    Acute metabolic encephalopathy multifactorial in the setting of acute illness, ESBL E. coli UTI, aspiration pneumonia, superimposed on baseline dementia Minimally interactive but is alert and answers simple questions.   ESBL E. coli UTI On IV Zosyn to complete the course of antibiotics.    Seizures Hospital course complicated by suspected seizure activity on 02/26/2019. EEG does not show any active seizures. Avoid meropenem and continue with Keppra and Tegretol.    Essential hypertension Blood pressure parameters are well controlled. Continue with Norvasc and hydralazine.   Hypokalemia Replaced    Dementia Supportive care   History of peripheral vascular disease Vascular surgery suggested patient is not a surgical candidate.    PT and OT eval recommending SNF.  DVT prophylaxis: heparin.  Code Status:  Full code.  Family Communication: none at bedside.  Disposition Plan:  . SNF in the next 24 hours.     Consultants:   None.   Procedures: None.   Antimicrobials:  Zosyn since 02/26/19.   Subjective: No new complaints.   Objective: Vitals:   03/04/19 0630 03/04/19 0800 03/04/19 1200 03/04/19 1349  BP:  (!) 143/73 136/81 (!) 149/78  Pulse:    77  Resp: '17 18 20   '$ Temp:    98.3 F (36.8 C)  TempSrc:    Oral  SpO2: 99% 100% 100%   Weight:      Height:        Intake/Output Summary (Last 24 hours) at 03/04/2019 1518 Last data filed at 03/04/2019 1400 Gross per 24 hour  Intake 1000 ml  Output 427 ml  Net 573 ml   Filed Weights   03/02/19 0023 03/03/19 0241 03/04/19 0400  Weight: 63.5 kg 63.7 kg 63.5 kg    Examination:  General exam: Appears calm and comfortable  Respiratory system: Clear to auscultation. Respiratory effort normal. Cardiovascular system: S1 & S2 heard, RRR. No pedal edema. Gastrointestinal system: Abdomen is nondistended, soft and nontender.  Normal bowel sounds heard. Central nervous system: Alert but confused.  Extremities: Symmetric 5 x 5 power. Skin: sacral and heel pressure injury.  Psychiatry: cannot be assessed.    Data Reviewed: I have personally reviewed following labs and imaging studies  CBC: Recent Labs  Lab 02/28/19 0500 03/01/19 0353 03/02/19 0240 03/03/19 0508 03/04/19 0412  WBC 8.7 8.8 8.6 8.3 9.7  NEUTROABS 4.9 5.3 4.9 4.7 8.3*  HGB 12.2* 13.8 12.8* 13.1 12.7*  HCT 36.9* 40.7 38.0* 38.7* 37.3*  MCV 97.9 95.3 96.9 96.0 95.6  PLT 301 355 388 424* 573*   Basic Metabolic Panel: Recent  Labs  Lab 02/26/19 0332 02/26/19 1218 02/26/19 1709 02/27/19 0442 02/27/19 1638 02/28/19 0500 03/01/19 0353 03/02/19 0240  NA 144  --   --  142  --  140 139 138  K 3.9  --   --  3.4*  --  3.4* 3.8 4.0  CL 107  --   --  107  --  103 103 102  CO2 28  --   --  28  --  '27 27 25  '$ GLUCOSE 104*  --   --  140*  --  116* 115* 123*  BUN 7*  --   --  11  --  '9 8 10  '$ CREATININE 0.73  --   --  0.66  --  0.71 0.56* 0.54*  CALCIUM 8.8*  --    --  8.1*  --  7.8* 8.1* 8.1*  MG  --  1.8 1.8 1.8 1.5* 1.6*  --   --   PHOS  --  2.5 2.1* 1.7* 2.8  --   --   --    GFR: Estimated Creatinine Clearance: 78.3 mL/min (A) (by C-G formula based on SCr of 0.54 mg/dL (L)). Liver Function Tests: No results for input(s): AST, ALT, ALKPHOS, BILITOT, PROT, ALBUMIN in the last 168 hours. No results for input(s): LIPASE, AMYLASE in the last 168 hours. No results for input(s): AMMONIA in the last 168 hours. Coagulation Profile: No results for input(s): INR, PROTIME in the last 168 hours. Cardiac Enzymes: No results for input(s): CKTOTAL, CKMB, CKMBINDEX, TROPONINI in the last 168 hours. BNP (last 3 results) No results for input(s): PROBNP in the last 8760 hours. HbA1C: No results for input(s): HGBA1C in the last 72 hours. CBG: Recent Labs  Lab 03/03/19 1951 03/04/19 0006 03/04/19 0408 03/04/19 0739 03/04/19 1121  GLUCAP 103* 103* 112* 86 121*   Lipid Profile: No results for input(s): CHOL, HDL, LDLCALC, TRIG, CHOLHDL, LDLDIRECT in the last 72 hours. Thyroid Function Tests: No results for input(s): TSH, T4TOTAL, FREET4, T3FREE, THYROIDAB in the last 72 hours. Anemia Panel: No results for input(s): VITAMINB12, FOLATE, FERRITIN, TIBC, IRON, RETICCTPCT in the last 72 hours. Sepsis Labs: No results for input(s): PROCALCITON, LATICACIDVEN in the last 168 hours.  Recent Results (from the past 240 hour(s))  Culture, blood (routine x 2)     Status: None   Collection Time: 02/23/19  4:26 PM   Specimen: BLOOD  Result Value Ref Range Status   Specimen Description BLOOD LEFT ANTECUBITAL  Final   Special Requests   Final    BOTTLES DRAWN AEROBIC AND ANAEROBIC Blood Culture adequate volume   Culture NO GROWTH 5 DAYS  Final   Report Status 02/28/2019 FINAL  Final  Respiratory Panel by RT PCR (Flu A&B, Covid) - Nasopharyngeal Swab     Status: None   Collection Time: 02/23/19  4:27 PM   Specimen: Nasopharyngeal Swab  Result Value Ref Range Status    SARS Coronavirus 2 by RT PCR NEGATIVE NEGATIVE Final    Comment: (NOTE) SARS-CoV-2 target nucleic acids are NOT DETECTED. The SARS-CoV-2 RNA is generally detectable in upper respiratoy specimens during the acute phase of infection. The lowest concentration of SARS-CoV-2 viral copies this assay can detect is 131 copies/mL. A negative result does not preclude SARS-Cov-2 infection and should not be used as the sole basis for treatment or other patient management decisions. A negative result may occur with  improper specimen collection/handling, submission of specimen other than nasopharyngeal swab, presence of viral mutation(s)  within the areas targeted by this assay, and inadequate number of viral copies (<131 copies/mL). A negative result must be combined with clinical observations, patient history, and epidemiological information. The expected result is Negative. Fact Sheet for Patients:  PinkCheek.be Fact Sheet for Healthcare Providers:  GravelBags.it This test is not yet ap proved or cleared by the [place] FDA and  has been authorized for detection and/or diagnosis of SARS-CoV-2 by FDA under an Emergency Use Authorization (EUA). This EUA will remain  in effect (meaning this test can be used) for the duration of the COVID-19 declaration under Section 564(b)(1) of the Act, 21 U.S.C. section 360bbb-3(b)(1), unless the authorization is terminated or revoked sooner.    Influenza A by PCR NEGATIVE NEGATIVE Final   Influenza B by PCR NEGATIVE NEGATIVE Final    Comment: (NOTE) The Xpert Xpress SARS-CoV-2/FLU/RSV assay is intended as an aid in  the diagnosis of influenza from Nasopharyngeal swab specimens and  should not be used as a sole basis for treatment. Nasal washings and  aspirates are unacceptable for Xpert Xpress SARS-CoV-2/FLU/RSV  testing. Fact Sheet for Patients: PinkCheek.be Fact  Sheet for Healthcare Providers: GravelBags.it This test is not yet approved or cleared by the [place] FDA and  has been authorized for detection and/or diagnosis of SARS-CoV-2 by  FDA under an Emergency Use Authorization (EUA). This EUA will remain  in effect (meaning this test can be used) for the duration of the  Covid-19 declaration under Section 564(b)(1) of the Act, 21  U.S.C. section 360bbb-3(b)(1), unless the authorization is  terminated or revoked. Performed at [facility], [place] [address] [place], [place] [address]   Culture, blood (routine x 2)     Status: None   Collection Time: 02/23/19  4:28 PM   Specimen: BLOOD  Result Value Ref Range Status   Specimen Description BLOOD RIGHT ANTECUBITAL  Final   Special Requests   Final    BOTTLES DRAWN AEROBIC AND ANAEROBIC Blood Culture adequate volume   Culture NO GROWTH 5 DAYS  Final   Report Status 02/28/2019 FINAL  Final  MRSA PCR Screening     Status: None   Collection Time: 02/23/19  8:49 PM   Specimen: Nasal Mucosa; Nasopharyngeal  Result Value Ref Range Status   MRSA by PCR NEGATIVE NEGATIVE Final    Comment:        The GeneXpert MRSA Assay (FDA approved for NASAL specimens only), is one component of a comprehensive MRSA colonization surveillance program. It is not intended to diagnose MRSA infection nor to guide or monitor treatment for MRSA infections. Performed at [facility], [place] [address] [place], [place] [address]   Urine culture     Status: Abnormal   Collection Time: 02/23/19 10:34 PM   Specimen: Urine, Catheterized  Result Value Ref Range Status   Specimen Description URINE, CATHETERIZED  Final   Special Requests   Final    NONE Performed at [facility], [place] [address] [place], [place] [address]    Culture (A)  Final    >=100,000 COLONIES/mL ESCHERICHIA COLI Confirmed Extended Spectrum Beta-Lactamase Producer (ESBL).  In bloodstream infections  from ESBL organisms, carbapenems are preferred over piperacillin/tazobactam. They are shown to have a lower risk of mortality.    Report Status 02/26/2019 FINAL  Final   Organism ID, Bacteria ESCHERICHIA COLI (A)  Final      Susceptibility   Escherichia coli - MIC*    AMPICILLIN >=32 RESISTANT  Resistant     CEFAZOLIN >=64 RESISTANT Resistant     CEFTRIAXONE >=64 RESISTANT Resistant     CIPROFLOXACIN 1 SENSITIVE Sensitive     GENTAMICIN >=16 RESISTANT Resistant     IMIPENEM <=0.25 SENSITIVE Sensitive     NITROFURANTOIN <=16 SENSITIVE Sensitive     TRIMETH/SULFA >=320 RESISTANT Resistant     AMPICILLIN/SULBACTAM >=32 RESISTANT Resistant     PIP/TAZO <=4 SENSITIVE Sensitive     * >=100,000 COLONIES/mL ESCHERICHIA COLI  Culture, respiratory (non-expectorated)     Status: None   Collection Time: 02/24/19  4:29 AM   Specimen: Tracheal Aspirate; Respiratory  Result Value Ref Range Status   Specimen Description TRACHEAL ASPIRATE  Final   Special Requests NONE  Final   Gram Stain   Final    ABUNDANT WBC PRESENT, PREDOMINANTLY PMN ABUNDANT GRAM POSITIVE COCCI IN CLUSTERS FEW GRAM NEGATIVE RODS    Culture   Final    Consistent with normal respiratory flora. Performed at [facility], [place] [address] [place], [place] [address]    Report Status 02/26/2019 FINAL  Final         Radiology Studies: No results found.      Scheduled Meds: . amLODipine  10 mg Per Tube Daily  . carbamazepine  300 mg Per Tube BID  . chlorhexidine gluconate (MEDLINE KIT)  15 mL Mouth Rinse BID  . clonazepam  0.25 mg Per Tube BID  . clopidogrel  75 mg Per Tube Daily  . feeding supplement (ENSURE ENLIVE)  237 mL Oral TID BM  . folic acid  1 mg Per Tube Daily  . heparin  5,000 Units Subcutaneous Q8H  . hydrALAZINE  25 mg Per Tube Q8H  . lacosamide  300 mg Per Tube BID  . levETIRAcetam  1,500 mg Per Tube BID  . mouth rinse  15 mL Mouth Rinse 10 times per day  . multivitamin with minerals  1  tablet Oral Daily  . pantoprazole (PROTONIX) IV  40 mg Intravenous QHS  . simvastatin  10 mg Per Tube q1800  . thiamine  100 mg Per Tube Daily  . valproic acid  750 mg Per Tube Q8H   Continuous Infusions: . sodium chloride Stopped (02/26/19 1335)  . piperacillin-tazobactam (ZOSYN)  IV 3.375 g (03/04/19 1341)     LOS: 9 days        Hosie Poisson, MD [facility]   To contact the attending provider between 7A-7P or the covering provider during after hours 7P-7A, please log into the web site [email or web address] and access using universal [place] password for that web site. If you do not have the password, please call the hospital operator.  03/04/2019, 3:18 PM

## 2019-03-05 LAB — GLUCOSE, CAPILLARY
Glucose-Capillary: 103 mg/dL — ABNORMAL HIGH (ref 70–99)
Glucose-Capillary: 104 mg/dL — ABNORMAL HIGH (ref 70–99)
Glucose-Capillary: 78 mg/dL (ref 70–99)
Glucose-Capillary: 90 mg/dL (ref 70–99)
Glucose-Capillary: 93 mg/dL (ref 70–99)
Glucose-Capillary: 93 mg/dL (ref 70–99)
Glucose-Capillary: 96 mg/dL (ref 70–99)

## 2019-03-05 LAB — CBC WITH DIFFERENTIAL/PLATELET
Abs Immature Granulocytes: 0.07 10*3/uL (ref 0.00–0.07)
Basophils Absolute: 0 10*3/uL (ref 0.0–0.1)
Basophils Relative: 0 %
Eosinophils Absolute: 0.1 10*3/uL (ref 0.0–0.5)
Eosinophils Relative: 1 %
HCT: 36 % — ABNORMAL LOW (ref 39.0–52.0)
Hemoglobin: 12.2 g/dL — ABNORMAL LOW (ref 13.0–17.0)
Immature Granulocytes: 1 %
Lymphocytes Relative: 35 %
Lymphs Abs: 3.2 10*3/uL (ref 0.7–4.0)
MCH: 32.5 pg (ref 26.0–34.0)
MCHC: 33.9 g/dL (ref 30.0–36.0)
MCV: 96 fL (ref 80.0–100.0)
Monocytes Absolute: 0.7 10*3/uL (ref 0.1–1.0)
Monocytes Relative: 7 %
Neutro Abs: 5.2 10*3/uL (ref 1.7–7.7)
Neutrophils Relative %: 56 %
Platelets: 494 10*3/uL — ABNORMAL HIGH (ref 150–400)
RBC: 3.75 MIL/uL — ABNORMAL LOW (ref 4.22–5.81)
RDW: 14.9 % (ref 11.5–15.5)
WBC: 9.3 10*3/uL (ref 4.0–10.5)
nRBC: 0 % (ref 0.0–0.2)

## 2019-03-05 LAB — SARS CORONAVIRUS 2 (TAT 6-24 HRS): SARS Coronavirus 2: NEGATIVE

## 2019-03-05 MED ORDER — PANTOPRAZOLE SODIUM 40 MG PO TBEC: 40.0000 mg | DELAYED_RELEASE_TABLET | Freq: Every day | ORAL | Status: DC

## 2019-03-05 MED ORDER — THIAMINE HCL 100 MG/ML IJ SOLN: 100.0000 mg | Freq: Once | INTRAMUSCULAR | Status: DC

## 2019-03-05 MED ORDER — SODIUM CHLORIDE 0.9 % IV SOLN: 1.0000 mg | Freq: Once | INTRAVENOUS | Status: DC

## 2019-03-05 MED ORDER — FOLIC ACID 5 MG/ML IJ SOLN
1.0000 mg | Freq: Once | INTRAMUSCULAR | Status: DC
  Filled 2019-03-05: qty 0.2

## 2019-03-05 MED ORDER — LEVETIRACETAM IN NACL 1500 MG/100ML IV SOLN
1500.0000 mg | Freq: Once | INTRAVENOUS | Status: AC
  Administered 2019-03-06: 1500 mg via INTRAVENOUS
  Filled 2019-03-05: qty 100

## 2019-03-05 MED ORDER — PANTOPRAZOLE SODIUM 40 MG IV SOLR
40.0000 mg | Freq: Once | INTRAVENOUS | Status: AC
  Administered 2019-03-06: 01:00:00 40 mg via INTRAVENOUS
  Filled 2019-03-05: qty 40

## 2019-03-05 MED ORDER — SODIUM CHLORIDE 0.9 % IV SOLN
300.0000 mg | Freq: Once | INTRAVENOUS | Status: AC
  Administered 2019-03-06: 01:00:00 300 mg via INTRAVENOUS
  Filled 2019-03-05: qty 30

## 2019-03-05 NOTE — Progress Notes (Signed)
PROGRESS NOTE    Lee [name]  TMH:[id number] DOB: 15-Nov-1950 DOA: 02/23/2019 PCP: Iona Beard, MD    Brief Narrative:  69 year old gentleman prior history of seizures, COPD, dementia, alcohol abuse, stroke presents from [facility] with shortness of breath and tachypnea.  Patient was intubated on arrival to ED and he was extubated on 02/26/2019.  Chest x-ray showed findings worrisome for pneumonia.  Hospital course complicated by ESBL U E. coli UTI.  Patient was transferred to [facility] on 02/27/2019.  SLP evaluation suggested patient has dysphagia and he was started on dysphagia 2 diet.  Plan for discharge to SNF in the next 24 to 48 hours.  An updated Covid test has been ordered.  Assessment & Plan:   Active Problems:   Acute respiratory failure (HCC)   Protein-calorie malnutrition, severe   Acute respiratory failure with hypoxia secondary to aspiration pneumonia. Patient was intubated on admission and extubated on 02/26/2019.  Patient is currently on room air with good oxygen saturations. SLP evaluation recommending dysphagia 2 diet. Patient is on IV Zosyn to complete the course of antibiotics for aspiration pneumonia.    Acute metabolic encephalopathy multifactorial in the setting of acute illness, ESBL E. coli UTI, aspiration pneumonia, superimposed on baseline dementia Minimally interactive today but he is alert.   ESBL E. coli UTI On IV Zosyn to complete the course of antibiotics.    Seizures Hospital course complicated by suspected seizure activity on 02/26/2019. EEG does not show any active seizures. Avoid meropenem and continue with Keppra and Tegretol.    Essential hypertension Blood pressure parameters are well controlled. Continue with Norvasc and hydralazine.   Hypokalemia Replaced    Dementia Continue with supportive care at this time.   History of peripheral vascular disease Vascular surgery suggested patient is not a surgical  candidate.  Pressure injury present on admission. Pressure Injury 03/01/19 Heel Left Deep Tissue Pressure Injury - Purple or maroon localized area of discolored intact skin or blood-filled blister due to damage of underlying soft tissue from pressure and/or shear. (Active)  03/01/19 0800  Location: Heel  Location Orientation: Left  Staging: Deep Tissue Pressure Injury - Purple or maroon localized area of discolored intact skin or blood-filled blister due to damage of underlying soft tissue from pressure and/or shear.  Wound Description (Comments):   Present on Admission:      Pressure Injury 03/04/19 Sacrum Mid Stage 1 -  Intact skin with non-blanchable redness of a localized area usually over a bony prominence. (Active)  03/04/19 1300  Location: Sacrum  Location Orientation: Mid  Staging: Stage 1 -  Intact skin with non-blanchable redness of a localized area usually over a bony prominence.  Wound Description (Comments):   Present on Admission:    Wound care consulted.     PT and OT eval recommending SNF.  DVT prophylaxis: heparin.  Code Status:  Full code.  Family Communication: Discussed with wife at bedside Disposition Plan:  . SNF in the next 24 hours.    Consultants:   None.   Procedures: None.   Antimicrobials:  Zosyn since 02/26/19.   Subjective: No chest pain or shortness of breath  Objective: Vitals:   03/05/19 0512 03/05/19 0513 03/05/19 0540 03/05/19 0816  BP: 138/84     Pulse:      Resp: 16 15    Temp: 98.3 F (36.8 C)   98.2 F (36.8 C)  TempSrc: Oral   Oral  SpO2:      Weight:  63.9 kg   Height:        Intake/Output Summary (Last 24 hours) at 03/05/2019 1206 Last data filed at 03/05/2019 0848 Gross per 24 hour  Intake 1587 ml  Output 675 ml  Net 912 ml   Filed Weights   03/03/19 0241 03/04/19 0400 03/05/19 0540  Weight: 63.7 kg 63.5 kg 63.9 kg    Examination:  General exam: Appears calm and comfortable Respiratory system: Clear  to auscultation bilaterally, no wheezing or rhonchi Cardiovascular system: S1-S2 heard, regular rate rhythm, no JVD Gastrointestinal system: Abdomen is soft , non tender non distended, bowel sounds normal.  Central nervous system: alert , not following commands today.  Extremities: no pedal edema.  Skin: stage 1 sacral pressure injury present on admission.  Psychiatry: cannot be assessed.    Data Reviewed: I have personally reviewed following labs and imaging studies  CBC: Recent Labs  Lab 03/01/19 0353 03/02/19 0240 03/03/19 0508 03/04/19 0412 03/05/19 0712  WBC 8.8 8.6 8.3 9.7 9.3  NEUTROABS 5.3 4.9 4.7 8.3* 5.2  HGB 13.8 12.8* 13.1 12.7* 12.2*  HCT 40.7 38.0* 38.7* 37.3* 36.0*  MCV 95.3 96.9 96.0 95.6 96.0  PLT 355 388 424* 466* 124*   Basic Metabolic Panel: Recent Labs  Lab 02/26/19 1218 02/26/19 1709 02/27/19 0442 02/27/19 1638 02/28/19 0500 03/01/19 0353 03/02/19 0240  NA  --   --  142  --  140 139 138  K  --   --  3.4*  --  3.4* 3.8 4.0  CL  --   --  107  --  103 103 102  CO2  --   --  28  --  '27 27 25  '$ GLUCOSE  --   --  140*  --  116* 115* 123*  BUN  --   --  11  --  '9 8 10  '$ CREATININE  --   --  0.66  --  0.71 0.56* 0.54*  CALCIUM  --   --  8.1*  --  7.8* 8.1* 8.1*  MG 1.8 1.8 1.8 1.5* 1.6*  --   --   PHOS 2.5 2.1* 1.7* 2.8  --   --   --    GFR: Estimated Creatinine Clearance: 78.8 mL/min (A) (by C-G formula based on SCr of 0.54 mg/dL (L)). Liver Function Tests: No results for input(s): AST, ALT, ALKPHOS, BILITOT, PROT, ALBUMIN in the last 168 hours. No results for input(s): LIPASE, AMYLASE in the last 168 hours. No results for input(s): AMMONIA in the last 168 hours. Coagulation Profile: No results for input(s): INR, PROTIME in the last 168 hours. Cardiac Enzymes: No results for input(s): CKTOTAL, CKMB, CKMBINDEX, TROPONINI in the last 168 hours. BNP (last 3 results) No results for input(s): PROBNP in the last 8760 hours. HbA1C: No results for  input(s): HGBA1C in the last 72 hours. CBG: Recent Labs  Lab 03/04/19 1643 03/04/19 2046 03/05/19 0016 03/05/19 0503 03/05/19 0813  GLUCAP 114* 103* 90 78 104*   Lipid Profile: No results for input(s): CHOL, HDL, LDLCALC, TRIG, CHOLHDL, LDLDIRECT in the last 72 hours. Thyroid Function Tests: No results for input(s): TSH, T4TOTAL, FREET4, T3FREE, THYROIDAB in the last 72 hours. Anemia Panel: No results for input(s): VITAMINB12, FOLATE, FERRITIN, TIBC, IRON, RETICCTPCT in the last 72 hours. Sepsis Labs: No results for input(s): PROCALCITON, LATICACIDVEN in the last 168 hours.  Recent Results (from the past 240 hour(s))  Culture, blood (routine x 2)     Status: None  Collection Time: 02/23/19  4:26 PM   Specimen: BLOOD  Result Value Ref Range Status   Specimen Description BLOOD LEFT ANTECUBITAL  Final   Special Requests   Final    BOTTLES DRAWN AEROBIC AND ANAEROBIC Blood Culture adequate volume   Culture NO GROWTH 5 DAYS  Final   Report Status 02/28/2019 FINAL  Final  Respiratory Panel by RT PCR (Flu A&B, Covid) - Nasopharyngeal Swab     Status: None   Collection Time: 02/23/19  4:27 PM   Specimen: Nasopharyngeal Swab  Result Value Ref Range Status   SARS Coronavirus 2 by RT PCR NEGATIVE NEGATIVE Final    Comment: (NOTE) SARS-CoV-2 target nucleic acids are NOT DETECTED. The SARS-CoV-2 RNA is generally detectable in upper respiratoy specimens during the acute phase of infection. The lowest concentration of SARS-CoV-2 viral copies this assay can detect is 131 copies/mL. A negative result does not preclude SARS-Cov-2 infection and should not be used as the sole basis for treatment or other patient management decisions. A negative result may occur with  improper specimen collection/handling, submission of specimen other than nasopharyngeal swab, presence of viral mutation(s) within the areas targeted by this assay, and inadequate number of viral copies (<131 copies/mL). A  negative result must be combined with clinical observations, patient history, and epidemiological information. The expected result is Negative. Fact Sheet for Patients:  PinkCheek.be Fact Sheet for Healthcare Providers:  GravelBags.it This test is not yet ap proved or cleared by the [place] FDA and  has been authorized for detection and/or diagnosis of SARS-CoV-2 by FDA under an Emergency Use Authorization (EUA). This EUA will remain  in effect (meaning this test can be used) for the duration of the COVID-19 declaration under Section 564(b)(1) of the Act, 21 U.S.C. section 360bbb-3(b)(1), unless the authorization is terminated or revoked sooner.    Influenza A by PCR NEGATIVE NEGATIVE Final   Influenza B by PCR NEGATIVE NEGATIVE Final    Comment: (NOTE) The Xpert Xpress SARS-CoV-2/FLU/RSV assay is intended as an aid in  the diagnosis of influenza from Nasopharyngeal swab specimens and  should not be used as a sole basis for treatment. Nasal washings and  aspirates are unacceptable for Xpert Xpress SARS-CoV-2/FLU/RSV  testing. Fact Sheet for Patients: PinkCheek.be Fact Sheet for Healthcare Providers: GravelBags.it This test is not yet approved or cleared by the [place] FDA and  has been authorized for detection and/or diagnosis of SARS-CoV-2 by  FDA under an Emergency Use Authorization (EUA). This EUA will remain  in effect (meaning this test can be used) for the duration of the  Covid-19 declaration under Section 564(b)(1) of the Act, 21  U.S.C. section 360bbb-3(b)(1), unless the authorization is  terminated or revoked. Performed at [facility], [place] [address], [place], [place] [address]   Culture, blood (routine x 2)     Status: None   Collection Time: 02/23/19  4:28 PM   Specimen: BLOOD  Result Value Ref Range Status   Specimen Description  BLOOD RIGHT ANTECUBITAL  Final   Special Requests   Final    BOTTLES DRAWN AEROBIC AND ANAEROBIC Blood Culture adequate volume   Culture NO GROWTH 5 DAYS  Final   Report Status 02/28/2019 FINAL  Final  MRSA PCR Screening     Status: None   Collection Time: 02/23/19  8:49 PM   Specimen: Nasal Mucosa; Nasopharyngeal  Result Value Ref Range Status   MRSA by PCR NEGATIVE NEGATIVE Final    Comment:  The GeneXpert MRSA Assay (FDA approved for NASAL specimens only), is one component of a comprehensive MRSA colonization surveillance program. It is not intended to diagnose MRSA infection nor to guide or monitor treatment for MRSA infections. Performed at [facility], [place] [address], [place], [place] [address]   Urine culture     Status: Abnormal   Collection Time: 02/23/19 10:34 PM   Specimen: Urine, Catheterized  Result Value Ref Range Status   Specimen Description URINE, CATHETERIZED  Final   Special Requests   Final    NONE Performed at [facility], [place] [address], [place], [place] [address]    Culture (A)  Final    >=100,000 COLONIES/mL ESCHERICHIA COLI Confirmed Extended Spectrum Beta-Lactamase Producer (ESBL).  In bloodstream infections from ESBL organisms, carbapenems are preferred over piperacillin/tazobactam. They are shown to have a lower risk of mortality.    Report Status 02/26/2019 FINAL  Final   Organism ID, Bacteria ESCHERICHIA COLI (A)  Final      Susceptibility   Escherichia coli - MIC*    AMPICILLIN >=32 RESISTANT Resistant     CEFAZOLIN >=64 RESISTANT Resistant     CEFTRIAXONE >=64 RESISTANT Resistant     CIPROFLOXACIN 1 SENSITIVE Sensitive     GENTAMICIN >=16 RESISTANT Resistant     IMIPENEM <=0.25 SENSITIVE Sensitive     NITROFURANTOIN <=16 SENSITIVE Sensitive     TRIMETH/SULFA >=320 RESISTANT Resistant     AMPICILLIN/SULBACTAM >=32 RESISTANT Resistant     PIP/TAZO <=4 SENSITIVE Sensitive     * >=100,000 COLONIES/mL ESCHERICHIA COLI    Culture, respiratory (non-expectorated)     Status: None   Collection Time: 02/24/19  4:29 AM   Specimen: Tracheal Aspirate; Respiratory  Result Value Ref Range Status   Specimen Description TRACHEAL ASPIRATE  Final   Special Requests NONE  Final   Gram Stain   Final    ABUNDANT WBC PRESENT, PREDOMINANTLY PMN ABUNDANT GRAM POSITIVE COCCI IN CLUSTERS FEW GRAM NEGATIVE RODS    Culture   Final    Consistent with normal respiratory flora. Performed at [facility], [place] [address], [place], [place] [address]    Report Status 02/26/2019 FINAL  Final         Radiology Studies: DG CHEST PORT 1 VIEW  Result Date: 03/04/2019 CLINICAL DATA:  Follow up aspiration. EXAM: PORTABLE CHEST 1 VIEW COMPARISON:  Radiographs 02/26/2019, 02/25/2019 and 02/24/2019. FINDINGS: 1611 hours. The patient is rotated to the left, and his mandible overlies the left lung apex. The heart size and mediastinal contours are stable with aortic atherosclerosis. The enteric tube has been removed. There are bibasilar airspace opacities with interval slight worsening in the left lower lobe. No significant changes are seen on the right. There is no significant pleural effusion or pneumothorax. IMPRESSION: Interval worsening of left lower lobe airspace disease consistent with aspiration pneumonia. Stable right basilar airspace disease. Electronically Signed   By: Richardean Sale M.D.   On: 03/04/2019 18:08        Scheduled Meds: . amLODipine  10 mg Per Tube Daily  . carbamazepine  300 mg Per Tube BID  . chlorhexidine gluconate (MEDLINE KIT)  15 mL Mouth Rinse BID  . clonazepam  0.25 mg Per Tube BID  . clopidogrel  75 mg Per Tube Daily  . feeding supplement (ENSURE ENLIVE)  237 mL Oral TID BM  . folic acid  1 mg Per Tube Daily  . heparin  5,000 Units Subcutaneous Q8H  .  hydrALAZINE  25 mg Per Tube Q8H  . lacosamide  300 mg Per Tube BID  . levETIRAcetam  1,500 mg Per Tube BID  . mouth rinse  15 mL Mouth Rinse  10 times per day  . multivitamin with minerals  1 tablet Oral Daily  . pantoprazole  40 mg Oral QHS  . simvastatin  10 mg Per Tube q1800  . thiamine  100 mg Per Tube Daily  . valproic acid  750 mg Per Tube Q8H   Continuous Infusions: . sodium chloride Stopped (02/26/19 1335)     LOS: 10 days        Hosie Poisson, MD [facility]   To contact the attending provider between 7A-7P or the covering provider during after hours 7P-7A, please log into the web site [email or web address] and access using universal [place] password for that web site. If you do not have the password, please call the hospital operator.  03/05/2019, 12:06 PM

## 2019-03-05 NOTE — NC FL2 (Signed)
[place] LEVEL OF CARE SCREENING TOOL     IDENTIFICATION  Patient Name: Lee [name] Birthdate: Mar 11, 1950 Sex: male Admission Date (Current Location): 02/23/2019  [facility] and [place] Number:  [name] and Address:  [facility], [place] [address] [place], [place] [address]      Provider Number: [id number]  Attending Physician Name and Address:  Hosie Poisson, MD  Relative Name and Phone Number:  Jeannene Patella (spouse) [phone]    Current Level of Care: Hospital Recommended Level of Care: [place] Prior Approval Number:    Date Approved/Denied:   PASRR Number: [id number]  Discharge Plan: SNF    Current Diagnoses: Patient Active Problem List   Diagnosis Date Noted  . Protein-calorie malnutrition, severe 03/04/2019  . Acute respiratory failure ([place]) 02/23/2019  . Pressure injury of skin 06/30/2018  . Acute respiratory failure with hypoxia ([place])   . Seizure disorder ([place]) 06/25/2018  . Seizure ([place]) 08/29/2016  . Leukocytosis 08/29/2016  . History of CVA (cerebrovascular accident) 08/29/2016  . Somnolence 08/29/2016  . Post-ictal state ([place]) 08/29/2016  . Lip laceration   . Pain   . Leg weakness, bilateral   . Leg pain, bilateral 08/17/2016  . UTI (urinary tract infection) 08/15/2016  . Aggression 07/26/2016  . Dementia with behavioral disturbance ([place]) 07/26/2016  . Elevated troponin 12/30/2015  . Chest pain 12/30/2015  . Pneumonia, likely aspiration 11/15/2013  . Status epilepticus ([place]) 11/14/2013  . Essential hypertension 11/14/2013  . Cerebral thrombosis with cerebral infarction ([place]) 11/07/2013  . Seizures ([place]) 11/06/2013  . Chronic airway obstruction, not elsewhere classified 09/22/2013  . Cerebral infarction due to unspecified mechanism 09/22/2013  . Hyperlipidemia 09/18/2013  . Encephalopathy acute 09/11/2013  . Altered mental status 09/11/2013  . CVA (cerebral infarction) 09/11/2013   . Hyponatremia 09/11/2013  . Acute encephalopathy 09/11/2013  . Aftercare following surgery of the circulatory system, [place] 08/25/2012  . Foot swelling 02/04/2012  . Drainage from wound 01/04/2012  . Peripheral vascular disease, unspecified ([place]) 11/05/2011  . Pain in limb 11/05/2011  . Chronic total occlusion of artery of the extremities ([place]) 11/05/2011  . PAD (peripheral artery disease) ([place]) 10/28/2011  . TOBACCO ABUSE 02/06/2007  . SYMPTOM, EDEMA 06/13/2006  . ERECTILE DYSFUNCTION 01/25/2006    Orientation RESPIRATION BLADDER Height & Weight     (disoriented x4)  Normal Incontinent, External catheter Weight: 140 lb 14 oz (63.9 kg) Height:  '5\' 10"'$  (177.8 cm)  BEHAVIORAL SYMPTOMS/MOOD NEUROLOGICAL BOWEL NUTRITION STATUS      Incontinent Diet(see discharge summary)  AMBULATORY STATUS COMMUNICATION OF NEEDS Skin   Extensive Assist Verbally Skin abrasions, Other (Comment)(abrasion on left lower extremity; cracking on bilateral heels)                       Personal Care Assistance Level of Assistance  Bathing, Feeding, Dressing Bathing Assistance: Maximum assistance Feeding assistance: Limited assistance Dressing Assistance: Maximum assistance     Functional Limitations Info  Sight, Hearing, Speech Sight Info: Adequate Hearing Info: Adequate Speech Info: Adequate    SPECIAL CARE FACTORS FREQUENCY  OT (By licensed OT), PT (By licensed PT)     PT Frequency: 5x week OT Frequency: 5x week            Contractures Contractures Info: Not present    Additional Factors Info  Code Status, Allergies, Isolation Precautions, Psychotropic Code Status Info: Full Code Allergies Info: Orange Juice (Orange Oil), Penicillins, Vicodin (Hydrocodone-acetaminophen),  Oxycodone Psychotropic Info: clonazePAM (KLONOPIN) disintegrating tablet 0.25 mg daily per tube   Isolation Precautions Info: contact precautions for ESBL     Current Medications (03/05/2019):  This is the current  hospital active medication list Current Facility-Administered Medications  Medication Dose Route Frequency Provider Last Rate Last Admin  . 0.9 %  sodium chloride infusion   Intravenous PRN Candee Furbish, MD   Stopped at 02/26/19 1335  . acetaminophen (TYLENOL) 160 MG/5ML solution 500 mg  500 mg Oral Q8H PRN Magdalen Spatz, NP   500 mg at 02/26/19 2109  . albuterol (PROVENTIL) (2.5 MG/3ML) 0.083% nebulizer solution 2.5 mg  2.5 mg Nebulization Q3H PRN Ollis, Brandi L, NP      . amLODipine (NORVASC) tablet 10 mg  10 mg Per Tube Daily Candee Furbish, MD   10 mg at 03/05/19 0913  . bisacodyl (DULCOLAX) suppository 10 mg  10 mg Rectal Daily PRN Merlene Laughter F, NP      . carbamazepine (TEGRETOL) tablet 300 mg  300 mg Per Tube BID Shearon Stalls, Rahul P, PA-C   300 mg at 03/05/19 0912  . chlorhexidine gluconate (MEDLINE KIT) (PERIDEX) 0.12 % solution 15 mL  15 mL Mouth Rinse BID Olalere, Adewale A, MD   15 mL at 03/05/19 0900  . clonazePAM (KLONOPIN) disintegrating tablet 0.25 mg  0.25 mg Per Tube BID Candee Furbish, MD   0.25 mg at 03/04/19 2121  . clopidogrel (PLAVIX) tablet 75 mg  75 mg Per Tube Daily Desai, Rahul P, PA-C   75 mg at 03/05/19 0913  . docusate (COLACE) 50 MG/5ML liquid 100 mg  100 mg Per Tube BID PRN Merlene Laughter F, NP      . feeding supplement (ENSURE ENLIVE) (ENSURE ENLIVE) liquid 237 mL  237 mL Oral TID BM Hosie Poisson, MD   237 mL at 03/05/19 0914  . folic acid (FOLVITE) tablet 1 mg  1 mg Per Tube Daily Ollis, Brandi L, NP   1 mg at 03/05/19 0913  . heparin injection 5,000 Units  5,000 Units Subcutaneous Q8H Merlene Laughter F, NP   5,000 Units at 03/05/19 [phone]  . hydrALAZINE (APRESOLINE) injection 5 mg  5 mg Intravenous Q6H PRN Irene Pap N, DO   5 mg at 03/01/19 0042  . hydrALAZINE (APRESOLINE) tablet 25 mg  25 mg Per Tube Q8H Hall, Carole N, DO   25 mg at 03/05/19 0641  . labetalol (NORMODYNE) injection 10-20 mg  10-20 mg Intravenous Q10 min PRN Shearon Stalls, Rahul P, PA-C   20 mg at  02/26/19 0348  . lacosamide (VIMPAT) tablet 300 mg  300 mg Per Tube BID Olalere, Adewale A, MD   300 mg at 03/04/19 2122  . levETIRAcetam (KEPPRA) 100 MG/ML solution 1,500 mg  1,500 mg Per Tube BID Shearon Stalls, Rahul P, PA-C   1,500 mg at 03/05/19 0913  . MEDLINE mouth rinse  15 mL Mouth Rinse 10 times per day Sherrilyn Rist A, MD   15 mL at 03/05/19 0915  . multivitamin with minerals tablet 1 tablet  1 tablet Oral Daily Hosie Poisson, MD   1 tablet at 03/05/19 0912  . pantoprazole (PROTONIX) EC tablet 40 mg  40 mg Oral QHS Einar Grad, [facility]      . simvastatin (ZOCOR) tablet 10 mg  10 mg Per Tube q1800 Ollis, Brandi L, NP   10 mg at 03/04/19 1816  . thiamine tablet 100 mg  100 mg Per Tube Daily Noe Gens  L, NP   100 mg at 03/05/19 0912  . valproic acid (DEPAKENE) 250 MG/5ML solution 750 mg  750 mg Per Tube Q8H Desai, Rahul P, PA-C   750 mg at 03/05/19 8323     Discharge Medications: Please see discharge summary for a list of discharge medications.  Relevant Imaging Results:  Relevant Lab Results:   Additional Information SSN: [id number]  Alberteen Sam, LCSW

## 2019-03-05 NOTE — TOC Initial Note (Signed)
Transition of Care [facility]) - Initial/Assessment Note    Patient Details  Name: Lee [name] MRN: [id number] Date of Birth: [birth year]  Transition of Care [facility]) CM/SW Contact:    Gildardo Griffes, LCSW Phone Number: 03/05/2019, 11:52 AM  Clinical Narrative:                  CSW spoke with patient's spouse [name] she reports she is in agreement wit patient returning to [facility] at time of discharge, would like to ensure he has a window room.   CSW spoke with [place] at [facility], confirmed they are able to accept patient back when ready, and that no insurance Berkley Harvey is needed as patient has a long term medicaid bed and has been there for 4 years, and will return to a window room.   CSW requesting updated covid test order from MD to prepare for timely dc.   Expected Discharge Plan: Skilled Nursing Facility Barriers to Discharge: Continued Medical Work up   Patient Goals and CMS Choice   CMS Medicare.gov Compare Post Acute Care list provided to:: Patient Represenative (must comment)(spouse [name]) Choice offered to / list presented to : Spouse  Expected Discharge Plan and Services Expected Discharge Plan: Skilled Nursing Facility     Post Acute Care Choice: Skilled Nursing Facility Living arrangements for the past 2 months: Skilled Nursing Facility([facility])                                      Prior Living Arrangements/Services Living arrangements for the past 2 months: Skilled Nursing Facility([facility]) Lives with:: Self Patient language and need for interpreter reviewed:: Yes Do you feel safe going back to the place where you live?: Yes      Need for Family Participation in Patient Care: Yes (Comment) Care giver support system in place?: Yes (comment)   Criminal Activity/Legal Involvement Pertinent to Current Situation/Hospitalization: No - Comment as needed  Activities of Daily Living      Permission Sought/Granted Permission sought to share information with : Case  Manager, Magazine features editor, Family Supports Permission granted to share information with : Yes, Verbal Permission Granted  Share Information with NAME: [name]  Permission granted to share info w AGENCY: SNFs  Permission granted to share info w Relationship: spouse  Permission granted to share info w Contact Information: [phone]  Emotional Assessment Appearance:: Appears stated age Attitude/Demeanor/Rapport: Unable to Assess Affect (typically observed): Unable to Assess Orientation: : Oriented to Self Alcohol / Substance Use: Not Applicable Psych Involvement: No (comment)  Admission diagnosis:  Acute respiratory failure (HCC) [J96.00] Acute respiratory failure with hypoxia (HCC) [J96.01] Aspiration into airway, initial encounter [T17.908A] Patient Active Problem List   Diagnosis Date Noted  . Protein-calorie malnutrition, severe 03/04/2019  . Acute respiratory failure (HCC) 02/23/2019  . Pressure injury of skin 06/30/2018  . Acute respiratory failure with hypoxia (HCC)   . Seizure disorder (HCC) 06/25/2018  . Seizure (HCC) 08/29/2016  . Leukocytosis 08/29/2016  . History of CVA (cerebrovascular accident) 08/29/2016  . Somnolence 08/29/2016  . Post-ictal state (HCC) 08/29/2016  . Lip laceration   . Pain   . Leg weakness, bilateral   . Leg pain, bilateral 08/17/2016  . UTI (urinary tract infection) 08/15/2016  . Aggression 07/26/2016  . Dementia with behavioral disturbance (HCC) 07/26/2016  . Elevated troponin 12/30/2015  . Chest pain 12/30/2015  . Pneumonia, likely aspiration 11/15/2013  . Status  epilepticus ([place]) 11/14/2013  . Essential hypertension 11/14/2013  . Cerebral thrombosis with cerebral infarction ([place]) 11/07/2013  . Seizures ([place]) 11/06/2013  . Chronic airway obstruction, not elsewhere classified 09/22/2013  . Cerebral infarction due to unspecified mechanism 09/22/2013  . Hyperlipidemia 09/18/2013  . Encephalopathy acute 09/11/2013  . Altered  mental status 09/11/2013  . CVA (cerebral infarction) 09/11/2013  . Hyponatremia 09/11/2013  . Acute encephalopathy 09/11/2013  . Aftercare following surgery of the circulatory system, [place] 08/25/2012  . Foot swelling 02/04/2012  . Drainage from wound 01/04/2012  . Peripheral vascular disease, unspecified ([place]) 11/05/2011  . Pain in limb 11/05/2011  . Chronic total occlusion of artery of the extremities ([place]) 11/05/2011  . PAD (peripheral artery disease) ([place]) 10/28/2011  . TOBACCO ABUSE 02/06/2007  . SYMPTOM, EDEMA 06/13/2006  . ERECTILE DYSFUNCTION 01/25/2006   PCP:  Iona Beard, MD Pharmacy:   [facility] [place] [place] - [address] [place] [address] Phone: [phone] Fax: [phone]     Social Determinants of Health ([place]) Interventions    Readmission Risk Interventions No flowsheet data found.

## 2019-03-05 NOTE — Consult Note (Signed)
WOC Nurse Consult Note: Pt receiving care [facility] 3E25  Reason for Consult: sacral and heel ulcer? Wound type: Lt heel DTPI Pressure Injury POA: No Measurement: 5cm x 6cm x 0cm Wound bed: skin intact to discolored area -purple, pink and maroon  Drainage (amount, consistency, odor) none Periwound: intact Dressing procedure/placement/frequency: offloading with pillow and foam dressing over area which can stay up to 3 days.  Wound type: Stage 2 sacrum Pressure Injury POA: No Measurement: 2cm x 1cm x 0.1cm Wound bed: pink Periwound: intact Dressing procedure/placement/frequency: offloading with pillow and foam dressing over area which can stay up to 3 days.  Thank you for the consult. Discussed the plan of care with the patient and beside nurse.  Alda Berthold, BSN, RN-BC, WTA-C, OCA

## 2019-03-05 NOTE — Progress Notes (Signed)
SLP Cancellation Note  Patient Details Name: [name] MRN: [id number] DOB: 10/31/1950   Cancelled treatment:       Reason Eval/Treat Not Completed: Fatigue/lethargy limiting ability to participate(Pt was unable to demonstrate an adequate level of alertness despite verbal and tactile stimulation. SLP will follow up.)  Nakari Bracknell I. Vear Clock, MS, CCC-SLP Acute Rehabilitation Services Office number [phone] Pager [phone]  Scheryl Marten 03/05/2019, 1:47 PM

## 2019-03-06 DIAGNOSIS — L89301 Pressure ulcer of unspecified buttock, stage 1: Secondary | ICD-10-CM | POA: Diagnosis not present

## 2019-03-06 DIAGNOSIS — R279 Unspecified lack of coordination: Secondary | ICD-10-CM | POA: Diagnosis not present

## 2019-03-06 DIAGNOSIS — A499 Bacterial infection, unspecified: Secondary | ICD-10-CM | POA: Diagnosis not present

## 2019-03-06 DIAGNOSIS — R569 Unspecified convulsions: Secondary | ICD-10-CM | POA: Diagnosis not present

## 2019-03-06 DIAGNOSIS — J690 Pneumonitis due to inhalation of food and vomit: Secondary | ICD-10-CM | POA: Diagnosis not present

## 2019-03-06 DIAGNOSIS — Z743 Need for continuous supervision: Secondary | ICD-10-CM | POA: Diagnosis not present

## 2019-03-06 DIAGNOSIS — R278 Other lack of coordination: Secondary | ICD-10-CM | POA: Diagnosis not present

## 2019-03-06 DIAGNOSIS — M6281 Muscle weakness (generalized): Secondary | ICD-10-CM | POA: Diagnosis not present

## 2019-03-06 DIAGNOSIS — L602 Onychogryphosis: Secondary | ICD-10-CM | POA: Diagnosis not present

## 2019-03-06 DIAGNOSIS — T17908A Unspecified foreign body in respiratory tract, part unspecified causing other injury, initial encounter: Secondary | ICD-10-CM | POA: Diagnosis not present

## 2019-03-06 DIAGNOSIS — Z9114 Patient's other noncompliance with medication regimen: Secondary | ICD-10-CM | POA: Diagnosis not present

## 2019-03-06 DIAGNOSIS — E43 Unspecified severe protein-calorie malnutrition: Secondary | ICD-10-CM | POA: Diagnosis not present

## 2019-03-06 DIAGNOSIS — I69969 Other paralytic syndrome following unspecified cerebrovascular disease affecting unspecified side: Secondary | ICD-10-CM | POA: Diagnosis not present

## 2019-03-06 DIAGNOSIS — R6889 Other general symptoms and signs: Secondary | ICD-10-CM | POA: Diagnosis not present

## 2019-03-06 DIAGNOSIS — R2681 Unsteadiness on feet: Secondary | ICD-10-CM | POA: Diagnosis not present

## 2019-03-06 DIAGNOSIS — L89153 Pressure ulcer of sacral region, stage 3: Secondary | ICD-10-CM | POA: Diagnosis not present

## 2019-03-06 DIAGNOSIS — I10 Essential (primary) hypertension: Secondary | ICD-10-CM | POA: Diagnosis not present

## 2019-03-06 DIAGNOSIS — R262 Difficulty in walking, not elsewhere classified: Secondary | ICD-10-CM | POA: Diagnosis not present

## 2019-03-06 DIAGNOSIS — R7989 Other specified abnormal findings of blood chemistry: Secondary | ICD-10-CM | POA: Diagnosis not present

## 2019-03-06 DIAGNOSIS — J9600 Acute respiratory failure, unspecified whether with hypoxia or hypercapnia: Secondary | ICD-10-CM | POA: Diagnosis not present

## 2019-03-06 DIAGNOSIS — L89620 Pressure ulcer of left heel, unstageable: Secondary | ICD-10-CM | POA: Diagnosis not present

## 2019-03-06 DIAGNOSIS — R600 Localized edema: Secondary | ICD-10-CM | POA: Diagnosis not present

## 2019-03-06 DIAGNOSIS — R1310 Dysphagia, unspecified: Secondary | ICD-10-CM | POA: Diagnosis not present

## 2019-03-06 DIAGNOSIS — L89610 Pressure ulcer of right heel, unstageable: Secondary | ICD-10-CM | POA: Diagnosis not present

## 2019-03-06 DIAGNOSIS — I7389 Other specified peripheral vascular diseases: Secondary | ICD-10-CM | POA: Diagnosis not present

## 2019-03-06 DIAGNOSIS — E678 Other specified hyperalimentation: Secondary | ICD-10-CM | POA: Diagnosis not present

## 2019-03-06 DIAGNOSIS — R1312 Dysphagia, oropharyngeal phase: Secondary | ICD-10-CM | POA: Diagnosis not present

## 2019-03-06 DIAGNOSIS — R531 Weakness: Secondary | ICD-10-CM | POA: Diagnosis not present

## 2019-03-06 DIAGNOSIS — R601 Generalized edema: Secondary | ICD-10-CM | POA: Diagnosis not present

## 2019-03-06 DIAGNOSIS — J9601 Acute respiratory failure with hypoxia: Secondary | ICD-10-CM | POA: Diagnosis not present

## 2019-03-06 DIAGNOSIS — I739 Peripheral vascular disease, unspecified: Secondary | ICD-10-CM | POA: Diagnosis not present

## 2019-03-06 LAB — CBC WITH DIFFERENTIAL/PLATELET
Abs Immature Granulocytes: 0.09 10*3/uL — ABNORMAL HIGH (ref 0.00–0.07)
Basophils Absolute: 0.1 10*3/uL (ref 0.0–0.1)
Basophils Relative: 1 %
Eosinophils Absolute: 0.1 10*3/uL (ref 0.0–0.5)
Eosinophils Relative: 1 %
HCT: 37.6 % — ABNORMAL LOW (ref 39.0–52.0)
Hemoglobin: 13 g/dL (ref 13.0–17.0)
Immature Granulocytes: 1 %
Lymphocytes Relative: 32 %
Lymphs Abs: 3.1 10*3/uL (ref 0.7–4.0)
MCH: 33.2 pg (ref 26.0–34.0)
MCHC: 34.6 g/dL (ref 30.0–36.0)
MCV: 96.2 fL (ref 80.0–100.0)
Monocytes Absolute: 0.8 10*3/uL (ref 0.1–1.0)
Monocytes Relative: 8 %
Neutro Abs: 5.6 10*3/uL (ref 1.7–7.7)
Neutrophils Relative %: 57 %
Platelets: 475 10*3/uL — ABNORMAL HIGH (ref 150–400)
RBC: 3.91 MIL/uL — ABNORMAL LOW (ref 4.22–5.81)
RDW: 14.9 % (ref 11.5–15.5)
WBC: 9.8 10*3/uL (ref 4.0–10.5)
nRBC: 0 % (ref 0.0–0.2)

## 2019-03-06 LAB — GLUCOSE, CAPILLARY: Glucose-Capillary: 101 mg/dL — ABNORMAL HIGH (ref 70–99)

## 2019-03-06 MED ORDER — ENSURE ENLIVE PO LIQD: 237.0000 mL | Freq: Three times a day (TID) | ORAL | 12 refills | Status: DC

## 2019-03-06 MED ORDER — HYDRALAZINE HCL 25 MG PO TABS: 25.0000 mg | ORAL_TABLET | Freq: Three times a day (TID) | ORAL | 1 refills | Status: DC

## 2019-03-06 MED ORDER — ADULT MULTIVITAMIN W/MINERALS CH: 1.0000 | ORAL_TABLET | Freq: Every day | ORAL | Status: DC

## 2019-03-06 MED ORDER — PANTOPRAZOLE SODIUM 40 MG PO TBEC: 40.0000 mg | DELAYED_RELEASE_TABLET | Freq: Every day | ORAL | 1 refills | Status: DC

## 2019-03-06 MED ORDER — AMLODIPINE BESYLATE 10 MG PO TABS: 10.0000 mg | ORAL_TABLET | Freq: Every day | ORAL | 1 refills | Status: DC

## 2019-03-06 MED ORDER — THIAMINE HCL 100 MG PO TABS: 100.0000 mg | ORAL_TABLET | Freq: Every day | ORAL | 0 refills | Status: DC

## 2019-03-06 MED ORDER — FOLIC ACID 1 MG PO TABS: 1.0000 mg | ORAL_TABLET | Freq: Every day | ORAL | 1 refills | Status: DC

## 2019-03-06 MED FILL — PANTOPRAZOLE SOD DR 40 MG T: 40 | 30 days supply | Qty: 30 | Fill #0

## 2019-03-06 NOTE — Progress Notes (Signed)
Report given to [employer] of [facility].  Per Aram Beecham CSW, [facility] pick up time set up @ 4pm.

## 2019-03-06 NOTE — TOC Transition Note (Signed)
Transition of Care [facility]) - CM/SW Discharge Note   Patient Details  Name: Lee [name] MRN: [id number] Date of Birth: Jun 06, 1950  Transition of Care [facility]) CM/SW Contact:  Eduard Roux, LCSWA Phone Number: 03/06/2019, 11:55 AM   Clinical Narrative:     Patient will DC to: [facility]  DC Date: 03/06/2019 Family Notified: [name],spouse Transport By: Sharin Mons @ 4pm   Per MD patient is ready for discharge. RN, patient, and facility notified of DC. Discharge Summary sent to facility. RN given number for [phone] . Ambulance transport requested for patient.   Clinical Social Worker signing off.  Antony Blackbird, MSW, LCSWA Clinical Social Worker   Final next level of care: Skilled Nursing Facility Barriers to Discharge: Barriers Resolved   Patient Goals and CMS Choice   CMS Medicare.gov Compare Post Acute Care list provided to:: Patient Represenative (must comment)(spouse [name]) Choice offered to / list presented to : Spouse  Discharge Placement PASRR number recieved: 03/05/19            Patient [facility] bed at: [facility] Patient to be transferred to facility by: [facility] Name of family member notified: [name],spouse Patient and family notified of of transfer: 03/06/19  Discharge Plan and Services     Post Acute Care Choice: Skilled Nursing Facility                               Social Determinants of Health (SDOH) Interventions     Readmission Risk Interventions No flowsheet data found.

## 2019-03-06 NOTE — Discharge Summary (Signed)
Physician Discharge Summary  Tessie EkeFranklin C Ebron XLK:[id number] DOB: 07/29/1950 DOA: 02/23/2019  PCP: Mirna MiresHill, Gerald, MD  Admit date: 02/23/2019 Discharge date: 03/06/2019  Admitted From:  SNF Disposition: [facility]  Recommendations for Outpatient Follow-up:  1. Follow up with PCP in 1-2 weeks 2. Please obtain BMP/CBC in one week Please follow up with SLP at the facility.    Discharge Condition:Guarded.  CODE STATUS:full code.  Diet recommendation: Dysphagia 2 DIET   Brief/Interim Summary: 69 year old gentleman prior history of seizures, COPD, dementia, alcohol abuse, stroke [facility] with shortness of breath and tachypnea.  Patient was intubated on arrival to ED and he was extubated on 02/26/2019.  Chest x-ray showed findings worrisome for pneumonia.  Hospital course complicated by ESBL U E. coli UTI.  [facility]  SLP evaluation suggested patient has dysphagia and he was started on dysphagia 2 diet.  Plan for discharge to SNF in the next 24 to 48 hours.  An updated Covid test has been ordered.   Discharge Diagnoses:  Active Problems:   Acute respiratory failure (HCC)   Protein-calorie malnutrition, severe  Acute respiratory failure with hypoxia secondary to aspiration pneumonia. Patient was intubated on admission and extubated on 02/26/2019.  Patient is currently on room air with good oxygen saturations. SLP evaluation recommending dysphagia 2 diet. Completed the course of antibiotics for aspiration pneumonia.    Acute metabolic encephalopathy multifactorial in the setting of acute illness, ESBL E. coli UTI, aspiration pneumonia, superimposed on baseline dementia    ESBL E. coli UTI completed 7 days of zosyn ,  the course of antibiotics.    Seizures Hospital course complicated by suspected seizure activity on 02/26/2019. EEG does not show any active seizures. Avoid meropenem and continue with Keppra and  Tegretol.    Essential hypertension Blood pressure parameters are well controlled. Continue with Norvasc and hydralazine.   Hypokalemia Replaced    Dementia Continue with supportive care at this time.   History of peripheral vascular disease Vascular surgery suggested patient is not a surgical candidate.  Pressure injury present on admission. Pressure Injury 03/01/19 Heel Left Deep Tissue Pressure Injury - Purple or maroon localized area of discolored intact skin or blood-filled blister due to damage of underlying soft tissue from pressure and/or shear. (Active)  03/01/19 0800  Location: Heel  Location Orientation: Left  Staging: Deep Tissue Pressure Injury - Purple or maroon localized area of discolored intact skin or blood-filled blister due to damage of underlying soft tissue from pressure and/or shear.  Wound Description (Comments):   Present on Admission:      Pressure Injury 03/04/19 Sacrum Mid Stage 1 -  Intact skin with non-blanchable redness of a localized area usually over a bony prominence. (Active)  03/04/19 1300  Location: Sacrum  Location Orientation: Mid  Staging: Stage 1 -  Intact skin with non-blanchable redness of a localized area usually over a bony prominence.  Wound Description (Comments):   Present on Admission:    Wound care consulted.    Discharge Instructions  Discharge Instructions    Diet - low sodium heart healthy   Complete by: As directed    Discharge instructions   Complete by: As directed    Please follow up with SLP at the SNF.     Allergies as of 03/06/2019      Reactions   Orange Juice [orange Oil] Diarrhea   Penicillins Nausea And Vomiting   Tolerates Zosyn. "might have been an  overdose" (01/22/2012) Has patient had a PCN reaction causing immediate rash, facial/tongue/throat swelling, SOB or lightheadedness with hypotension: yes Has patient had a PCN reaction causing severe rash involving mucus membranes or skin  necrosis: unknown Has patient had a PCN reaction that required hospitalization: no Has patient had a PCN reaction occurring within the last 10 years: no If all of the above answers are "NO", then may proceed with Cephalosporin u   Vicodin [hydrocodone-acetaminophen] Other (See Comments)   "triggered seizure both here and at home when he tried to take it" (01/22/2012)   Oxycodone    Causes Seizures      Medication List    STOP taking these medications   cephALEXin 500 MG capsule Commonly known as: KEFLEX   cyclobenzaprine 5 MG tablet Commonly known as: FLEXERIL   liver oil-zinc oxide 40 % ointment Commonly known as: DESITIN   ondansetron 4 MG tablet Commonly known as: ZOFRAN     TAKE these medications   acetaminophen 325 MG tablet Commonly known as: TYLENOL Take 650 mg by mouth every 12 (twelve) hours.   albuterol 108 (90 Base) MCG/ACT inhaler Commonly known as: VENTOLIN HFA Inhale 2 puffs into the lungs every 6 (six) hours as needed for wheezing or shortness of breath.   amLODipine 10 MG tablet Commonly known as: NORVASC Take 1 tablet (10 mg total) by mouth daily. Start taking on: March 07, 2019   B-complex with vitamin C tablet Take 1 tablet by mouth daily.   carbamazepine 200 MG tablet Commonly known as: Epitol Place 1.5 tablets (300 mg total) into feeding tube 2 (two) times daily.   cholecalciferol 1000 units tablet Commonly known as: VITAMIN D Take 1,000 Units by mouth daily. On Mondays.   clonazePAM 0.5 MG tablet Commonly known as: KLONOPIN Take 0.25 mg by mouth 2 (two) times daily. At 2pm and at 7pm - Per Mar.   clopidogrel 75 MG tablet Commonly known as: PLAVIX Take 1 tablet (75 mg total) by mouth daily.   DEXTRAN 70-HYPROMELLOSE OP Place 2 drops into the left eye 2 (two) times daily. Continue to flush left eye as tolerated.   docusate sodium 100 MG capsule Commonly known as: COLACE Take 1 capsule (100 mg total) by mouth 2 (two) times daily.    feeding supplement (ENSURE ENLIVE) Liqd Take 237 mLs by mouth 3 (three) times daily between meals.   folic acid 1 MG tablet Commonly known as: FOLVITE Take 1 tablet (1 mg total) by mouth daily. Start taking on: March 07, 2019   hydrALAZINE 25 MG tablet Commonly known as: APRESOLINE Take 1 tablet (25 mg total) by mouth every 8 (eight) hours.   levETIRAcetam 100 MG/ML solution Commonly known as: KEPPRA Take 15 mLs (1,500 mg total) by mouth 2 (two) times daily.   loratadine 10 MG tablet Commonly known as: CLARITIN Take 10 mg by mouth every other day.   magnesium oxide 400 MG tablet Commonly known as: MAG-OX Take 400 mg by mouth daily.   multivitamin with minerals Tabs tablet Take 1 tablet by mouth daily. Start taking on: March 07, 2019   NON FORMULARY Apply 1 mL topically in the morning, at noon, and at bedtime. ABH gel (Ativan 1mg Benito Mccreedy 12.5mg /Haldol 1mg ). Apply 29mL topically to forearm twice daily for mood disorder.   pantoprazole 40 MG tablet Commonly known as: PROTONIX Take 1 tablet (40 mg total) by mouth at bedtime.   polyethylene glycol 17 g packet Commonly known as: MIRALAX / GLYCOLAX Take 17 g  by mouth at bedtime. Take 17 grams mix with 8OZ of apple juice for constipation.   senna 8.6 MG Tabs tablet Commonly known as: SENOKOT Take 2 tablets by mouth at bedtime.   simvastatin 10 MG tablet Commonly known as: ZOCOR Take 10 mg by mouth at bedtime.   thiamine 100 MG tablet Take 1 tablet (100 mg total) by mouth daily. Start taking on: March 07, 2019   valproic acid 250 MG/5ML solution Commonly known as: DEPAKENE Take 15 mLs (750 mg total) by mouth every 8 (eight) hours. What changed: when to take this   Vimpat 150 MG Tabs Generic drug: Lacosamide Take 300 mg by mouth in the morning and at bedtime. What changed: Another medication with the same name was removed. Continue taking this medication, and follow the directions you see here.       Follow-up Information    Iona Beard, MD. Schedule an appointment as soon as possible for a visit in 1 week(s).   Specialty: Family Medicine Contact information: [address] [place] [address] [phone]          Allergies  Allergen Reactions  . Orange Juice [Orange Oil] Diarrhea  . Penicillins Nausea And Vomiting    Tolerates Zosyn. "might have been an overdose" (01/22/2012) Has patient had a PCN reaction causing immediate rash, facial/tongue/throat swelling, SOB or lightheadedness with hypotension: yes Has patient had a PCN reaction causing severe rash involving mucus membranes or skin necrosis: unknown Has patient had a PCN reaction that required hospitalization: no Has patient had a PCN reaction occurring within the last 10 years: no If all of the above answers are "NO", then may proceed with Cephalosporin u  . Vicodin [Hydrocodone-Acetaminophen] Other (See Comments)    "triggered seizure both here and at home when he tried to take it" (01/22/2012)  . Oxycodone     Causes Seizures    Consultations:  Wound care  slp   Procedures/Studies: EEG  Result Date: 02/26/2019 Lora Havens, MD     02/26/2019  2:27 PM Patient Name: LEONIDAS BOATENG MRN: [id number] Epilepsy Attending: Lora Havens Referring Physician/Provider: Dr. Ina Homes Date: 02/26/2019 Duration: 24.07 minutes Patient history: 69 year old male with history of epilepsy presented with acute hypoxic respiratory failure.  EEG to evaluate for seizures. Level of alertness: Comatose AEDs during EEG study: Carbamazepine, Vimpat, Keppra, clonazepam, valproic acid Technical aspects: This EEG study was done with scalp electrodes positioned according to the 10-20 International system of electrode placement. Electrical activity was acquired at a sampling rate of 500Hz  and reviewed with a high frequency filter of 70Hz  and a low frequency filter of 1Hz . EEG data were recorded continuously and digitally  stored. Description: EEG showed continuous generalized polymorphic 5 to 8 Hz theta-alpha activity as well as intermittent low amplitude generalized EEG suppression lasting 1 to 3 seconds.  EEG also showed frequent left frontal spikes, maximal Fp1, F3, Fz.  Hyperventilation and photic summation were not performed. Abnormality -Spikes, left frontal -Continuous slow, generalized -Background suppression, generalized IMPRESSION: This study showed evidence of epileptogenicity arising from left frontal region.  Additionally there is evidence of severe diffuse encephalopathy, nonspecific etiology. No seizures were seen throughout the recording. Lora Havens   DG CHEST PORT 1 VIEW  Result Date: 03/04/2019 CLINICAL DATA:  Follow up aspiration. EXAM: PORTABLE CHEST 1 VIEW COMPARISON:  Radiographs 02/26/2019, 02/25/2019 and 02/24/2019. FINDINGS: 1611 hours. The patient is rotated to the left, and his mandible overlies the left lung  apex. The heart size and mediastinal contours are stable with aortic atherosclerosis. The enteric tube has been removed. There are bibasilar airspace opacities with interval slight worsening in the left lower lobe. No significant changes are seen on the right. There is no significant pleural effusion or pneumothorax. IMPRESSION: Interval worsening of left lower lobe airspace disease consistent with aspiration pneumonia. Stable right basilar airspace disease. Electronically Signed   By: Carey Bullocks M.D.   On: 03/04/2019 18:08   DG CHEST PORT 1 VIEW  Result Date: 02/26/2019 CLINICAL DATA:  Respiratory failure EXAM: PORTABLE CHEST 1 VIEW COMPARISON:  02/25/2019 FINDINGS: Interval extubation. Enteric tube is coiled within the stomach. Tip is again near the gastroesophageal junction while side port is within the fundus. Increased atelectasis/consolidation at the right lung base. No significant pleural effusion. No pneumothorax. Stable cardiomediastinal contours. IMPRESSION: Increased right  basilar atelectasis/consolidation. Electronically Signed   By: Guadlupe Spanish M.D.   On: 02/26/2019 08:37   DG CHEST PORT 1 VIEW  Result Date: 02/25/2019 CLINICAL DATA:  Follow-up aspiration EXAM: PORTABLE CHEST 1 VIEW COMPARISON:  02/24/2019 FINDINGS: Endotracheal tube and gastric catheter are noted. Gastric catheter tip is seen at the gastroesophageal junction. This is stable from the prior exam. The lungs are well aerated bilaterally. Improved aeration in the bases is seen. No new focal abnormality is noted. Skin folds are seen over the left lung. No bony abnormality is noted. IMPRESSION: Improved aeration in the bases when compare with the prior exam. Electronically Signed   By: Alcide Clever M.D.   On: 02/25/2019 10:49   DG Chest Port 1 View  Result Date: 02/24/2019 CLINICAL DATA:  69 year old male status post intubation. EXAM: PORTABLE CHEST 1 VIEW COMPARISON:  Chest x-ray 02/23/2019. FINDINGS: An endotracheal tube is in place with tip 2.1 cm above the carina. Nasogastric tube extends into the stomach where it is coiled back upon itself with tip near the gastroesophageal junction. Opacity in the medial aspect of the left base likely reflects some subsegmental atelectasis in the left lower lobe. Ill-defined opacities in the medial aspects of the lower lungs bilaterally, without definite obscuration of the adjacent structures, which could suggest incomplete airspace consolidation in the medial aspects of both lungs, which could be seen in the setting of aspiration. No pleural effusions. No evidence of pulmonary edema. Heart size is normal. Upper mediastinal contours are within normal limits. IMPRESSION: 1. Support apparatus, as above. 2. Findings concerning for possible aspiration pneumonitis versus developing aspiration pneumonia in the medial aspects of the lower lungs bilaterally. 3. New area of subsegmental atelectasis in the medial aspect of the left lower lobe. 4. Aortic atherosclerosis.  Electronically Signed   By: Trudie Reed M.D.   On: 02/24/2019 08:34   DG Chest Portable 1 View  Result Date: 02/23/2019 CLINICAL DATA:  Status post intubation. EXAM: PORTABLE CHEST 1 VIEW COMPARISON:  Single-view of the chest 08/08/2018. FINDINGS: Endotracheal tube is in place with the tip in good position approximately 3.5 cm above the carina. There is bibasilar airspace disease. No pneumothorax. Heart size is normal. Atherosclerosis noted. IMPRESSION: ETT in good position. Bibasilar airspace disease worrisome for pneumonia. Electronically Signed   By: Drusilla Kanner M.D.   On: 02/23/2019 16:52   DG Abd Portable 1V  Result Date: 02/23/2019 CLINICAL DATA:  NG tube placement EXAM: PORTABLE ABDOMEN - 1 VIEW COMPARISON:  None. FINDINGS: The enteric tube projects over the expected region of the gastric body/fundus. There is a large amount of stool in  the colon. The bowel gas pattern is nonobstructive. Hazy airspace opacities are noted at the lung bases bilaterally. IMPRESSION: Enteric tube projects over the gastric body/fundus. Electronically Signed   By: Katherine Mantle M.D.   On: 02/23/2019 21:47   VAS [place] ABI WITH/WO TBI  Result Date: 02/25/2019 LOWER EXTREMITY DOPPLER STUDY Indications: Peripheral artery disease. High Risk Factors: Hypertension, hyperlipidemia.  Limitations: Today's exam was limited due to patient positioning and involuntary              patient movement. Comparison Study: No prior studies. Performing Technologist: Olen Cordial RVT  Examination Guidelines: A complete evaluation includes at minimum, Doppler waveform signals and systolic blood pressure reading at the level of bilateral brachial, anterior tibial, and posterior tibial arteries, when vessel segments are accessible. Bilateral testing is considered an integral part of a complete examination. Photoelectric Plethysmograph (PPG) waveforms and toe systolic pressure readings are included as required and additional duplex  testing as needed. Limited examinations for reoccurring indications may be performed as noted.  ABI Findings: +--------+------------------+-----+----------+--------+ Right   Rt Pressure (mmHg)IndexWaveform  Comment  +--------+------------------+-----+----------+--------+ Brachial                                 IV       +--------+------------------+-----+----------+--------+ PTA     122                    monophasic         +--------+------------------+-----+----------+--------+ DP      126                    monophasic         +--------+------------------+-----+----------+--------+ +---------+------------------+-----+----------+--------------------------------+ Left     Lt Pressure (mmHg)IndexWaveform  Comment                          +---------+------------------+-----+----------+--------------------------------+ PTA                                       Unable to insonate due to                                                  patient positioning              +---------+------------------+-----+----------+--------------------------------+ DP       170                    monophasic                                 +---------+------------------+-----+----------+--------------------------------+ Ilona Sorrel                                                                +---------+------------------+-----+----------+--------------------------------+  Summary: Right: Resting right ankle-brachial index indicates moderate right lower extremity arterial disease. Unable to obtain TBI due to patient positioning. Left: Resting left  ankle-brachial index indicates mild left lower extremity arterial disease. The left toe-brachial index is abnormal.  *See table(s) above for measurements and observations.  Electronically signed by Coral ElseVance Brabham MD on 02/25/2019 at 6:36:36 PM.   Final        Subjective: Calm and comforatable  Discharge Exam:  Vitals:   03/05/19 2116  03/05/19 2248 03/06/19 0400 03/06/19 0444  BP:  133/69  (!) 157/75  Pulse:  69  68  Resp: 17     Temp:   98.4 F (36.9 C)   TempSrc:  Oral Oral   SpO2: 98%     Weight:    65.4 kg  Height:        General: Pt is alert, awake, not in acute distress Cardiovascular: RRR, S1/S2 +, no rubs, no gallops Respiratory: CTA bilaterally, no wheezing,  Abdominal: Soft, NT, ND, bowel sounds + Extremities: no edema, no cyanosis    The results of significant diagnostics from this hospitalization (including imaging, microbiology, ancillary and laboratory) are listed below for reference.     Microbiology: Recent Results (from the past 240 hour(s))  SARS CORONAVIRUS 2 (TAT 6-24 HRS) Nasopharyngeal Nasopharyngeal Swab     Status: None   Collection Time: 03/05/19 12:06 PM   Specimen: Nasopharyngeal Swab  Result Value Ref Range Status   SARS Coronavirus 2 NEGATIVE NEGATIVE Final    Comment: (NOTE) SARS-CoV-2 target nucleic acids are NOT DETECTED. The SARS-CoV-2 RNA is generally detectable in upper and lower respiratory specimens during the acute phase of infection. Negative results do not preclude SARS-CoV-2 infection, do not rule out co-infections with other pathogens, and should not be used as the sole basis for treatment or other patient management decisions. Negative results must be combined with clinical observations, patient history, and epidemiological information. The expected result is Negative. Fact Sheet for Patients: HairSlick.nohttps://www.fda.gov/media/138098/download Fact Sheet for Healthcare Providers: quierodirigir.comhttps://www.fda.gov/media/138095/download This test is not yet approved or cleared by the [place] FDA and  has been authorized for detection and/or diagnosis of SARS-CoV-2 by FDA under an Emergency Use Authorization (EUA). This EUA will remain  in effect (meaning this test can be used) for the duration of the COVID-19 declaration under Section 56 4(b)(1) of the Act, 21 U.S.C. section  360bbb-3(b)(1), unless the authorization is terminated or revoked sooner. Performed at [facility], [address] [place], [place] [address]      Labs: BNP (last 3 results) Recent Labs    03/22/18 0557 02/23/19 1626  BNP 42.9 76.6   Basic Metabolic Panel: Recent Labs  Lab 02/27/19 1638 02/28/19 0500 03/01/19 0353 03/02/19 0240  NA  --  140 139 138  K  --  3.4* 3.8 4.0  CL  --  103 103 102  CO2  --  27 27 25   GLUCOSE  --  116* 115* 123*  BUN  --  9 8 10   CREATININE  --  0.71 0.56* 0.54*  CALCIUM  --  7.8* 8.1* 8.1*  MG 1.5* 1.6*  --   --   PHOS 2.8  --   --   --    Liver Function Tests: No results for input(s): AST, ALT, ALKPHOS, BILITOT, PROT, ALBUMIN in the last 168 hours. No results for input(s): LIPASE, AMYLASE in the last 168 hours. No results for input(s): AMMONIA in the last 168 hours. CBC: Recent Labs  Lab 03/02/19 0240 03/03/19 0508 03/04/19 0412 03/05/19 0712 03/06/19 0528  WBC 8.6 8.3 9.7 9.3 9.8  NEUTROABS 4.9 4.7  8.3* 5.2 5.6  HGB 12.8* 13.1 12.7* 12.2* 13.0  HCT 38.0* 38.7* 37.3* 36.0* 37.6*  MCV 96.9 96.0 95.6 96.0 96.2  PLT 388 424* 466* 494* 475*   Cardiac Enzymes: No results for input(s): CKTOTAL, CKMB, CKMBINDEX, TROPONINI in the last 168 hours. BNP: Invalid input(s): POCBNP CBG: Recent Labs  Lab 03/05/19 0813 03/05/19 1225 03/05/19 1631 03/05/19 2118 03/06/19 0551  GLUCAP 104* 93 96 93 101*   D-Dimer No results for input(s): DDIMER in the last 72 hours. Hgb A1c No results for input(s): HGBA1C in the last 72 hours. Lipid Profile No results for input(s): CHOL, HDL, LDLCALC, TRIG, CHOLHDL, LDLDIRECT in the last 72 hours. Thyroid function studies No results for input(s): TSH, T4TOTAL, T3FREE, THYROIDAB in the last 72 hours.  Invalid input(s): FREET3 Anemia work up No results for input(s): VITAMINB12, FOLATE, FERRITIN, TIBC, IRON, RETICCTPCT in the last 72 hours. Urinalysis    Component Value Date/Time    COLORURINE YELLOW 02/23/2019 1720   APPEARANCEUR HAZY (A) 02/23/2019 1720   LABSPEC 1.016 02/23/2019 1720   PHURINE 6.0 02/23/2019 1720   GLUCOSEU NEGATIVE 02/23/2019 1720   HGBUR NEGATIVE 02/23/2019 1720   BILIRUBINUR NEGATIVE 02/23/2019 1720   KETONESUR NEGATIVE 02/23/2019 1720   PROTEINUR 30 (A) 02/23/2019 1720   UROBILINOGEN 0.2 03/22/2014 1206   NITRITE NEGATIVE 02/23/2019 1720   LEUKOCYTESUR NEGATIVE 02/23/2019 1720   Sepsis Labs Invalid input(s): PROCALCITONIN,  WBC,  LACTICIDVEN Microbiology Recent Results (from the past 240 hour(s))  SARS CORONAVIRUS 2 (TAT 6-24 HRS) Nasopharyngeal Nasopharyngeal Swab     Status: None   Collection Time: 03/05/19 12:06 PM   Specimen: Nasopharyngeal Swab  Result Value Ref Range Status   SARS Coronavirus 2 NEGATIVE NEGATIVE Final    Comment: (NOTE) SARS-CoV-2 target nucleic acids are NOT DETECTED. The SARS-CoV-2 RNA is generally detectable in upper and lower respiratory specimens during the acute phase of infection. Negative results do not preclude SARS-CoV-2 infection, do not rule out co-infections with other pathogens, and should not be used as the sole basis for treatment or other patient management decisions. Negative results must be combined with clinical observations, patient history, and epidemiological information. The expected result is Negative. Fact Sheet for Patients: HairSlick.no Fact Sheet for Healthcare Providers: quierodirigir.com This test is not yet approved or cleared by the [place] FDA and  has been authorized for detection and/or diagnosis of SARS-CoV-2 by FDA under an Emergency Use Authorization (EUA). This EUA will remain  in effect (meaning this test can be used) for the duration of the COVID-19 declaration under Section 56 4(b)(1) of the Act, 21 U.S.C. section 360bbb-3(b)(1), unless the authorization is terminated or revoked sooner. Performed at [facility], [address], [place], [place] [address]      Time coordinating discharge: 38 minutes  SIGNED:   Kathlen Mody, MD  [facility] 03/06/2019, 9:21 AM

## 2019-03-09 DIAGNOSIS — J9601 Acute respiratory failure with hypoxia: Secondary | ICD-10-CM | POA: Diagnosis not present

## 2019-03-09 DIAGNOSIS — I7389 Other specified peripheral vascular diseases: Secondary | ICD-10-CM | POA: Diagnosis not present

## 2019-03-09 DIAGNOSIS — L89620 Pressure ulcer of left heel, unstageable: Secondary | ICD-10-CM | POA: Diagnosis not present

## 2019-03-09 DIAGNOSIS — L89301 Pressure ulcer of unspecified buttock, stage 1: Secondary | ICD-10-CM | POA: Diagnosis not present

## 2019-03-09 DIAGNOSIS — J690 Pneumonitis due to inhalation of food and vomit: Secondary | ICD-10-CM | POA: Diagnosis not present

## 2019-03-10 DIAGNOSIS — J9601 Acute respiratory failure with hypoxia: Secondary | ICD-10-CM | POA: Diagnosis not present

## 2019-03-10 DIAGNOSIS — I10 Essential (primary) hypertension: Secondary | ICD-10-CM | POA: Diagnosis not present

## 2019-03-10 DIAGNOSIS — A499 Bacterial infection, unspecified: Secondary | ICD-10-CM | POA: Diagnosis not present

## 2019-03-12 DIAGNOSIS — E678 Other specified hyperalimentation: Secondary | ICD-10-CM | POA: Diagnosis not present

## 2019-03-12 DIAGNOSIS — R7989 Other specified abnormal findings of blood chemistry: Secondary | ICD-10-CM | POA: Diagnosis not present

## 2019-03-12 DIAGNOSIS — R600 Localized edema: Secondary | ICD-10-CM | POA: Diagnosis not present

## 2019-03-17 DIAGNOSIS — R569 Unspecified convulsions: Secondary | ICD-10-CM | POA: Diagnosis not present

## 2019-03-17 DIAGNOSIS — I69969 Other paralytic syndrome following unspecified cerebrovascular disease affecting unspecified side: Secondary | ICD-10-CM | POA: Diagnosis not present

## 2019-03-17 DIAGNOSIS — I10 Essential (primary) hypertension: Secondary | ICD-10-CM | POA: Diagnosis not present

## 2019-03-17 DIAGNOSIS — J690 Pneumonitis due to inhalation of food and vomit: Secondary | ICD-10-CM | POA: Diagnosis not present

## 2019-03-17 DIAGNOSIS — Z9114 Patient's other noncompliance with medication regimen: Secondary | ICD-10-CM | POA: Diagnosis not present

## 2019-03-19 DIAGNOSIS — L89153 Pressure ulcer of sacral region, stage 3: Secondary | ICD-10-CM | POA: Diagnosis not present

## 2019-03-19 DIAGNOSIS — L89610 Pressure ulcer of right heel, unstageable: Secondary | ICD-10-CM | POA: Diagnosis not present

## 2019-03-19 DIAGNOSIS — L89620 Pressure ulcer of left heel, unstageable: Secondary | ICD-10-CM | POA: Diagnosis not present

## 2019-03-19 DIAGNOSIS — R1310 Dysphagia, unspecified: Secondary | ICD-10-CM | POA: Diagnosis not present

## 2019-03-23 DIAGNOSIS — R1319 Other dysphagia: Secondary | ICD-10-CM | POA: Diagnosis not present

## 2019-03-23 DIAGNOSIS — R2681 Unsteadiness on feet: Secondary | ICD-10-CM | POA: Diagnosis not present

## 2019-03-23 DIAGNOSIS — M79662 Pain in left lower leg: Secondary | ICD-10-CM | POA: Diagnosis not present

## 2019-03-23 DIAGNOSIS — J690 Pneumonitis due to inhalation of food and vomit: Secondary | ICD-10-CM | POA: Diagnosis not present

## 2019-03-23 DIAGNOSIS — J9601 Acute respiratory failure with hypoxia: Secondary | ICD-10-CM | POA: Diagnosis not present

## 2019-03-23 DIAGNOSIS — R278 Other lack of coordination: Secondary | ICD-10-CM | POA: Diagnosis not present

## 2019-03-23 DIAGNOSIS — M24571 Contracture, right ankle: Secondary | ICD-10-CM | POA: Diagnosis not present

## 2019-03-23 DIAGNOSIS — M79661 Pain in right lower leg: Secondary | ICD-10-CM | POA: Diagnosis not present

## 2019-03-23 DIAGNOSIS — R293 Abnormal posture: Secondary | ICD-10-CM | POA: Diagnosis not present

## 2019-03-23 DIAGNOSIS — J9690 Respiratory failure, unspecified, unspecified whether with hypoxia or hypercapnia: Secondary | ICD-10-CM | POA: Diagnosis not present

## 2019-03-23 DIAGNOSIS — R1312 Dysphagia, oropharyngeal phase: Secondary | ICD-10-CM | POA: Diagnosis not present

## 2019-03-23 DIAGNOSIS — Z9181 History of falling: Secondary | ICD-10-CM | POA: Diagnosis not present

## 2019-03-23 DIAGNOSIS — R6889 Other general symptoms and signs: Secondary | ICD-10-CM | POA: Diagnosis not present

## 2019-03-24 DIAGNOSIS — M79661 Pain in right lower leg: Secondary | ICD-10-CM | POA: Diagnosis not present

## 2019-03-24 DIAGNOSIS — R1319 Other dysphagia: Secondary | ICD-10-CM | POA: Diagnosis not present

## 2019-03-24 DIAGNOSIS — M79662 Pain in left lower leg: Secondary | ICD-10-CM | POA: Diagnosis not present

## 2019-03-24 DIAGNOSIS — Z9181 History of falling: Secondary | ICD-10-CM | POA: Diagnosis not present

## 2019-03-24 DIAGNOSIS — R1312 Dysphagia, oropharyngeal phase: Secondary | ICD-10-CM | POA: Diagnosis not present

## 2019-03-24 DIAGNOSIS — I10 Essential (primary) hypertension: Secondary | ICD-10-CM | POA: Diagnosis not present

## 2019-03-24 DIAGNOSIS — J9601 Acute respiratory failure with hypoxia: Secondary | ICD-10-CM | POA: Diagnosis not present

## 2019-03-24 DIAGNOSIS — J9690 Respiratory failure, unspecified, unspecified whether with hypoxia or hypercapnia: Secondary | ICD-10-CM | POA: Diagnosis not present

## 2019-03-24 DIAGNOSIS — J984 Other disorders of lung: Secondary | ICD-10-CM | POA: Diagnosis not present

## 2019-03-24 DIAGNOSIS — R2681 Unsteadiness on feet: Secondary | ICD-10-CM | POA: Diagnosis not present

## 2019-03-24 DIAGNOSIS — R278 Other lack of coordination: Secondary | ICD-10-CM | POA: Diagnosis not present

## 2019-03-24 DIAGNOSIS — R293 Abnormal posture: Secondary | ICD-10-CM | POA: Diagnosis not present

## 2019-03-24 DIAGNOSIS — J690 Pneumonitis due to inhalation of food and vomit: Secondary | ICD-10-CM | POA: Diagnosis not present

## 2019-03-24 DIAGNOSIS — M24571 Contracture, right ankle: Secondary | ICD-10-CM | POA: Diagnosis not present

## 2019-03-25 DIAGNOSIS — R278 Other lack of coordination: Secondary | ICD-10-CM | POA: Diagnosis not present

## 2019-03-25 DIAGNOSIS — R2681 Unsteadiness on feet: Secondary | ICD-10-CM | POA: Diagnosis not present

## 2019-03-25 DIAGNOSIS — Z9181 History of falling: Secondary | ICD-10-CM | POA: Diagnosis not present

## 2019-03-25 DIAGNOSIS — R293 Abnormal posture: Secondary | ICD-10-CM | POA: Diagnosis not present

## 2019-03-25 DIAGNOSIS — J690 Pneumonitis due to inhalation of food and vomit: Secondary | ICD-10-CM | POA: Diagnosis not present

## 2019-03-25 DIAGNOSIS — M79661 Pain in right lower leg: Secondary | ICD-10-CM | POA: Diagnosis not present

## 2019-03-25 DIAGNOSIS — R1319 Other dysphagia: Secondary | ICD-10-CM | POA: Diagnosis not present

## 2019-03-25 DIAGNOSIS — M79662 Pain in left lower leg: Secondary | ICD-10-CM | POA: Diagnosis not present

## 2019-03-25 DIAGNOSIS — J9690 Respiratory failure, unspecified, unspecified whether with hypoxia or hypercapnia: Secondary | ICD-10-CM | POA: Diagnosis not present

## 2019-03-25 DIAGNOSIS — M24571 Contracture, right ankle: Secondary | ICD-10-CM | POA: Diagnosis not present

## 2019-03-25 DIAGNOSIS — J9601 Acute respiratory failure with hypoxia: Secondary | ICD-10-CM | POA: Diagnosis not present

## 2019-03-25 DIAGNOSIS — R1312 Dysphagia, oropharyngeal phase: Secondary | ICD-10-CM | POA: Diagnosis not present

## 2019-03-26 DIAGNOSIS — M79662 Pain in left lower leg: Secondary | ICD-10-CM | POA: Diagnosis not present

## 2019-03-26 DIAGNOSIS — J9601 Acute respiratory failure with hypoxia: Secondary | ICD-10-CM | POA: Diagnosis not present

## 2019-03-26 DIAGNOSIS — R1312 Dysphagia, oropharyngeal phase: Secondary | ICD-10-CM | POA: Diagnosis not present

## 2019-03-26 DIAGNOSIS — R2681 Unsteadiness on feet: Secondary | ICD-10-CM | POA: Diagnosis not present

## 2019-03-26 DIAGNOSIS — J9690 Respiratory failure, unspecified, unspecified whether with hypoxia or hypercapnia: Secondary | ICD-10-CM | POA: Diagnosis not present

## 2019-03-26 DIAGNOSIS — M24571 Contracture, right ankle: Secondary | ICD-10-CM | POA: Diagnosis not present

## 2019-03-26 DIAGNOSIS — M79661 Pain in right lower leg: Secondary | ICD-10-CM | POA: Diagnosis not present

## 2019-03-26 DIAGNOSIS — R293 Abnormal posture: Secondary | ICD-10-CM | POA: Diagnosis not present

## 2019-03-26 DIAGNOSIS — R1319 Other dysphagia: Secondary | ICD-10-CM | POA: Diagnosis not present

## 2019-03-26 DIAGNOSIS — R278 Other lack of coordination: Secondary | ICD-10-CM | POA: Diagnosis not present

## 2019-03-26 DIAGNOSIS — J690 Pneumonitis due to inhalation of food and vomit: Secondary | ICD-10-CM | POA: Diagnosis not present

## 2019-03-26 DIAGNOSIS — Z9181 History of falling: Secondary | ICD-10-CM | POA: Diagnosis not present

## 2019-03-27 DIAGNOSIS — M79661 Pain in right lower leg: Secondary | ICD-10-CM | POA: Diagnosis not present

## 2019-03-27 DIAGNOSIS — R293 Abnormal posture: Secondary | ICD-10-CM | POA: Diagnosis not present

## 2019-03-27 DIAGNOSIS — R278 Other lack of coordination: Secondary | ICD-10-CM | POA: Diagnosis not present

## 2019-03-27 DIAGNOSIS — J9601 Acute respiratory failure with hypoxia: Secondary | ICD-10-CM | POA: Diagnosis not present

## 2019-03-27 DIAGNOSIS — M24571 Contracture, right ankle: Secondary | ICD-10-CM | POA: Diagnosis not present

## 2019-03-27 DIAGNOSIS — J9690 Respiratory failure, unspecified, unspecified whether with hypoxia or hypercapnia: Secondary | ICD-10-CM | POA: Diagnosis not present

## 2019-03-27 DIAGNOSIS — R1312 Dysphagia, oropharyngeal phase: Secondary | ICD-10-CM | POA: Diagnosis not present

## 2019-03-27 DIAGNOSIS — M79662 Pain in left lower leg: Secondary | ICD-10-CM | POA: Diagnosis not present

## 2019-03-27 DIAGNOSIS — Z9181 History of falling: Secondary | ICD-10-CM | POA: Diagnosis not present

## 2019-03-27 DIAGNOSIS — J690 Pneumonitis due to inhalation of food and vomit: Secondary | ICD-10-CM | POA: Diagnosis not present

## 2019-03-27 DIAGNOSIS — R2681 Unsteadiness on feet: Secondary | ICD-10-CM | POA: Diagnosis not present

## 2019-03-27 DIAGNOSIS — R1319 Other dysphagia: Secondary | ICD-10-CM | POA: Diagnosis not present

## 2019-03-28 DIAGNOSIS — J9601 Acute respiratory failure with hypoxia: Secondary | ICD-10-CM | POA: Diagnosis not present

## 2019-03-28 DIAGNOSIS — M24571 Contracture, right ankle: Secondary | ICD-10-CM | POA: Diagnosis not present

## 2019-03-28 DIAGNOSIS — J690 Pneumonitis due to inhalation of food and vomit: Secondary | ICD-10-CM | POA: Diagnosis not present

## 2019-03-28 DIAGNOSIS — J9690 Respiratory failure, unspecified, unspecified whether with hypoxia or hypercapnia: Secondary | ICD-10-CM | POA: Diagnosis not present

## 2019-03-28 DIAGNOSIS — R2681 Unsteadiness on feet: Secondary | ICD-10-CM | POA: Diagnosis not present

## 2019-03-28 DIAGNOSIS — R278 Other lack of coordination: Secondary | ICD-10-CM | POA: Diagnosis not present

## 2019-03-28 DIAGNOSIS — M79662 Pain in left lower leg: Secondary | ICD-10-CM | POA: Diagnosis not present

## 2019-03-28 DIAGNOSIS — M79661 Pain in right lower leg: Secondary | ICD-10-CM | POA: Diagnosis not present

## 2019-03-28 DIAGNOSIS — R1319 Other dysphagia: Secondary | ICD-10-CM | POA: Diagnosis not present

## 2019-03-28 DIAGNOSIS — Z9181 History of falling: Secondary | ICD-10-CM | POA: Diagnosis not present

## 2019-03-28 DIAGNOSIS — R293 Abnormal posture: Secondary | ICD-10-CM | POA: Diagnosis not present

## 2019-03-28 DIAGNOSIS — R1312 Dysphagia, oropharyngeal phase: Secondary | ICD-10-CM | POA: Diagnosis not present

## 2019-03-29 ENCOUNTER — Encounter (HOSPITAL_COMMUNITY): Payer: Self-pay | Admitting: Emergency Medicine

## 2019-03-29 ENCOUNTER — Inpatient Hospital Stay (HOSPITAL_COMMUNITY)
Admission: EM | Admit: 2019-03-29 | Discharge: 2019-04-03 | DRG: 871 | Disposition: A | Discharge status: Skilled Nursing Facility | Payer: Medicare Other | Source: Skilled Nursing Facility | Attending: Internal Medicine | Admitting: Internal Medicine

## 2019-03-29 ENCOUNTER — Emergency Department (HOSPITAL_COMMUNITY): Payer: Medicare Other

## 2019-03-29 ENCOUNTER — Other Ambulatory Visit: Payer: Self-pay

## 2019-03-29 DIAGNOSIS — R2681 Unsteadiness on feet: Secondary | ICD-10-CM | POA: Diagnosis not present

## 2019-03-29 DIAGNOSIS — R1312 Dysphagia, oropharyngeal phase: Secondary | ICD-10-CM | POA: Diagnosis not present

## 2019-03-29 DIAGNOSIS — M1990 Unspecified osteoarthritis, unspecified site: Secondary | ICD-10-CM | POA: Diagnosis not present

## 2019-03-29 DIAGNOSIS — R569 Unspecified convulsions: Secondary | ICD-10-CM

## 2019-03-29 DIAGNOSIS — J9621 Acute and chronic respiratory failure with hypoxia: Secondary | ICD-10-CM | POA: Diagnosis present

## 2019-03-29 DIAGNOSIS — Z8249 Family history of ischemic heart disease and other diseases of the circulatory system: Secondary | ICD-10-CM

## 2019-03-29 DIAGNOSIS — R1310 Dysphagia, unspecified: Secondary | ICD-10-CM | POA: Diagnosis not present

## 2019-03-29 DIAGNOSIS — Z91018 Allergy to other foods: Secondary | ICD-10-CM

## 2019-03-29 DIAGNOSIS — J690 Pneumonitis due to inhalation of food and vomit: Secondary | ICD-10-CM | POA: Diagnosis present

## 2019-03-29 DIAGNOSIS — E876 Hypokalemia: Secondary | ICD-10-CM | POA: Diagnosis present

## 2019-03-29 DIAGNOSIS — Z8673 Personal history of transient ischemic attack (TIA), and cerebral infarction without residual deficits: Secondary | ICD-10-CM | POA: Diagnosis not present

## 2019-03-29 DIAGNOSIS — Z743 Need for continuous supervision: Secondary | ICD-10-CM | POA: Diagnosis not present

## 2019-03-29 DIAGNOSIS — Z885 Allergy status to narcotic agent status: Secondary | ICD-10-CM | POA: Diagnosis not present

## 2019-03-29 DIAGNOSIS — G40409 Other generalized epilepsy and epileptic syndromes, not intractable, without status epilepticus: Secondary | ICD-10-CM | POA: Diagnosis present

## 2019-03-29 DIAGNOSIS — Z681 Body mass index (BMI) 19 or less, adult: Secondary | ICD-10-CM | POA: Diagnosis not present

## 2019-03-29 DIAGNOSIS — M2550 Pain in unspecified joint: Secondary | ICD-10-CM | POA: Diagnosis not present

## 2019-03-29 DIAGNOSIS — R64 Cachexia: Secondary | ICD-10-CM | POA: Diagnosis not present

## 2019-03-29 DIAGNOSIS — I639 Cerebral infarction, unspecified: Secondary | ICD-10-CM | POA: Diagnosis present

## 2019-03-29 DIAGNOSIS — Z833 Family history of diabetes mellitus: Secondary | ICD-10-CM | POA: Diagnosis not present

## 2019-03-29 DIAGNOSIS — E785 Hyperlipidemia, unspecified: Secondary | ICD-10-CM | POA: Diagnosis present

## 2019-03-29 DIAGNOSIS — K219 Gastro-esophageal reflux disease without esophagitis: Secondary | ICD-10-CM | POA: Diagnosis present

## 2019-03-29 DIAGNOSIS — R278 Other lack of coordination: Secondary | ICD-10-CM | POA: Diagnosis not present

## 2019-03-29 DIAGNOSIS — Z823 Family history of stroke: Secondary | ICD-10-CM | POA: Diagnosis not present

## 2019-03-29 DIAGNOSIS — M79662 Pain in left lower leg: Secondary | ICD-10-CM | POA: Diagnosis not present

## 2019-03-29 DIAGNOSIS — J449 Chronic obstructive pulmonary disease, unspecified: Secondary | ICD-10-CM | POA: Diagnosis not present

## 2019-03-29 DIAGNOSIS — I700 Atherosclerosis of aorta: Secondary | ICD-10-CM | POA: Diagnosis not present

## 2019-03-29 DIAGNOSIS — M24571 Contracture, right ankle: Secondary | ICD-10-CM | POA: Diagnosis not present

## 2019-03-29 DIAGNOSIS — R1319 Other dysphagia: Secondary | ICD-10-CM | POA: Diagnosis not present

## 2019-03-29 DIAGNOSIS — F0390 Unspecified dementia without behavioral disturbance: Secondary | ICD-10-CM | POA: Diagnosis present

## 2019-03-29 DIAGNOSIS — L89152 Pressure ulcer of sacral region, stage 2: Secondary | ICD-10-CM | POA: Diagnosis not present

## 2019-03-29 DIAGNOSIS — I10 Essential (primary) hypertension: Secondary | ICD-10-CM | POA: Diagnosis present

## 2019-03-29 DIAGNOSIS — D7589 Other specified diseases of blood and blood-forming organs: Secondary | ICD-10-CM | POA: Diagnosis present

## 2019-03-29 DIAGNOSIS — Z20822 Contact with and (suspected) exposure to covid-19: Secondary | ICD-10-CM | POA: Diagnosis present

## 2019-03-29 DIAGNOSIS — I739 Peripheral vascular disease, unspecified: Secondary | ICD-10-CM | POA: Diagnosis not present

## 2019-03-29 DIAGNOSIS — Z9181 History of falling: Secondary | ICD-10-CM | POA: Diagnosis not present

## 2019-03-29 DIAGNOSIS — R52 Pain, unspecified: Secondary | ICD-10-CM | POA: Diagnosis not present

## 2019-03-29 DIAGNOSIS — A419 Sepsis, unspecified organism: Principal | ICD-10-CM | POA: Diagnosis present

## 2019-03-29 DIAGNOSIS — Z7401 Bed confinement status: Secondary | ICD-10-CM | POA: Diagnosis not present

## 2019-03-29 DIAGNOSIS — E875 Hyperkalemia: Secondary | ICD-10-CM | POA: Diagnosis not present

## 2019-03-29 DIAGNOSIS — J9690 Respiratory failure, unspecified, unspecified whether with hypoxia or hypercapnia: Secondary | ICD-10-CM | POA: Diagnosis not present

## 2019-03-29 DIAGNOSIS — M79661 Pain in right lower leg: Secondary | ICD-10-CM | POA: Diagnosis not present

## 2019-03-29 DIAGNOSIS — Z87891 Personal history of nicotine dependence: Secondary | ICD-10-CM | POA: Diagnosis not present

## 2019-03-29 DIAGNOSIS — R404 Transient alteration of awareness: Secondary | ICD-10-CM | POA: Diagnosis not present

## 2019-03-29 DIAGNOSIS — R0902 Hypoxemia: Secondary | ICD-10-CM

## 2019-03-29 DIAGNOSIS — Z87442 Personal history of urinary calculi: Secondary | ICD-10-CM

## 2019-03-29 DIAGNOSIS — Z800 Family history of malignant neoplasm of digestive organs: Secondary | ICD-10-CM

## 2019-03-29 DIAGNOSIS — L89616 Pressure-induced deep tissue damage of right heel: Secondary | ICD-10-CM | POA: Diagnosis not present

## 2019-03-29 DIAGNOSIS — Z880 Allergy status to penicillin: Secondary | ICD-10-CM

## 2019-03-29 DIAGNOSIS — R293 Abnormal posture: Secondary | ICD-10-CM | POA: Diagnosis not present

## 2019-03-29 DIAGNOSIS — R4182 Altered mental status, unspecified: Secondary | ICD-10-CM | POA: Diagnosis present

## 2019-03-29 DIAGNOSIS — J9601 Acute respiratory failure with hypoxia: Secondary | ICD-10-CM | POA: Diagnosis not present

## 2019-03-29 DIAGNOSIS — Z808 Family history of malignant neoplasm of other organs or systems: Secondary | ICD-10-CM

## 2019-03-29 DIAGNOSIS — Z79899 Other long term (current) drug therapy: Secondary | ICD-10-CM

## 2019-03-29 DIAGNOSIS — R0689 Other abnormalities of breathing: Secondary | ICD-10-CM | POA: Diagnosis not present

## 2019-03-29 DIAGNOSIS — Z7902 Long term (current) use of antithrombotics/antiplatelets: Secondary | ICD-10-CM

## 2019-03-29 DIAGNOSIS — F1010 Alcohol abuse, uncomplicated: Secondary | ICD-10-CM | POA: Diagnosis present

## 2019-03-29 LAB — CBC WITH DIFFERENTIAL/PLATELET
Abs Immature Granulocytes: 0.02 10*3/uL (ref 0.00–0.07)
Basophils Absolute: 0 10*3/uL (ref 0.0–0.1)
Basophils Relative: 0 %
Eosinophils Absolute: 0 10*3/uL (ref 0.0–0.5)
Eosinophils Relative: 0 %
HCT: 48.7 % (ref 39.0–52.0)
Hemoglobin: 15.8 g/dL (ref 13.0–17.0)
Immature Granulocytes: 0 %
Lymphocytes Relative: 8 %
Lymphs Abs: 1.1 10*3/uL (ref 0.7–4.0)
MCH: 33.3 pg (ref 26.0–34.0)
MCHC: 32.4 g/dL (ref 30.0–36.0)
MCV: 102.5 fL — ABNORMAL HIGH (ref 80.0–100.0)
Monocytes Absolute: 0.3 10*3/uL (ref 0.1–1.0)
Monocytes Relative: 2 %
Neutro Abs: 12.1 10*3/uL — ABNORMAL HIGH (ref 1.7–7.7)
Neutrophils Relative %: 90 %
Platelets: 286 10*3/uL (ref 150–400)
RBC: 4.75 MIL/uL (ref 4.22–5.81)
RDW: 15.3 % (ref 11.5–15.5)
WBC: 13.5 10*3/uL — ABNORMAL HIGH (ref 4.0–10.5)
nRBC: 0 % (ref 0.0–0.2)

## 2019-03-29 LAB — CBC
HCT: 42.5 % (ref 39.0–52.0)
Hemoglobin: 13.8 g/dL (ref 13.0–17.0)
MCH: 33.1 pg (ref 26.0–34.0)
MCHC: 32.5 g/dL (ref 30.0–36.0)
MCV: 101.9 fL — ABNORMAL HIGH (ref 80.0–100.0)
Platelets: 232 10*3/uL (ref 150–400)
RBC: 4.17 MIL/uL — ABNORMAL LOW (ref 4.22–5.81)
RDW: 15.4 % (ref 11.5–15.5)
WBC: 12.6 10*3/uL — ABNORMAL HIGH (ref 4.0–10.5)
nRBC: 0 % (ref 0.0–0.2)

## 2019-03-29 LAB — POCT I-STAT 7, (LYTES, BLD GAS, ICA,H+H)
Acid-Base Excess: 3 mmol/L — ABNORMAL HIGH (ref 0.0–2.0)
Bicarbonate: 29.4 mmol/L — ABNORMAL HIGH (ref 20.0–28.0)
Calcium, Ion: 1.27 mmol/L (ref 1.15–1.40)
HCT: 43 % (ref 39.0–52.0)
Hemoglobin: 14.6 g/dL (ref 13.0–17.0)
O2 Saturation: 99 %
Patient temperature: 99.3
Potassium: 3.8 mmol/L (ref 3.5–5.1)
Sodium: 141 mmol/L (ref 135–145)
TCO2: 31 mmol/L (ref 22–32)
pCO2 arterial: 51.1 mmHg — ABNORMAL HIGH (ref 32.0–48.0)
pH, Arterial: 7.369 (ref 7.350–7.450)
pO2, Arterial: 134 mmHg — ABNORMAL HIGH (ref 83.0–108.0)

## 2019-03-29 LAB — COMPREHENSIVE METABOLIC PANEL
ALT: 32 U/L (ref 0–44)
AST: 38 U/L (ref 15–41)
Albumin: 3.3 g/dL — ABNORMAL LOW (ref 3.5–5.0)
Alkaline Phosphatase: 135 U/L — ABNORMAL HIGH (ref 38–126)
Anion gap: 17 — ABNORMAL HIGH (ref 5–15)
BUN: 14 mg/dL (ref 8–23)
CO2: 25 mmol/L (ref 22–32)
Calcium: 9.3 mg/dL (ref 8.9–10.3)
Chloride: 101 mmol/L (ref 98–111)
Creatinine, Ser: 0.73 mg/dL (ref 0.61–1.24)
GFR calc Af Amer: >60 mL/min (ref >60)
GFR calc non Af Amer: >60 mL/min (ref >60)
Glucose, Bld: 111 mg/dL — ABNORMAL HIGH (ref 70–99)
Potassium: 4.5 mmol/L (ref 3.5–5.1)
Sodium: 143 mmol/L (ref 135–145)
Total Bilirubin: 0.5 mg/dL (ref 0.3–1.2)
Total Protein: 7.8 g/dL (ref 6.5–8.1)

## 2019-03-29 LAB — BRAIN NATRIURETIC PEPTIDE: B Natriuretic Peptide: 62.9 pg/mL (ref 0.0–100.0)

## 2019-03-29 LAB — LACTIC ACID, PLASMA: Lactic Acid, Venous: 5.3 mmol/L (ref 0.5–1.9)

## 2019-03-29 LAB — CREATININE, SERUM
Creatinine, Ser: 0.85 mg/dL (ref 0.61–1.24)
GFR calc Af Amer: >60 mL/min (ref >60)
GFR calc non Af Amer: >60 mL/min (ref >60)

## 2019-03-29 LAB — HIV ANTIBODY (ROUTINE TESTING W REFLEX): HIV Screen 4th Generation wRfx: NONREACTIVE

## 2019-03-29 LAB — MAGNESIUM: Magnesium: 1.5 mg/dL — ABNORMAL LOW (ref 1.7–2.4)

## 2019-03-29 LAB — SARS CORONAVIRUS 2 (TAT 6-24 HRS): SARS Coronavirus 2: NEGATIVE

## 2019-03-29 MED ORDER — ONDANSETRON HCL 4 MG PO TABS: 4.0000 mg | ORAL_TABLET | Freq: Four times a day (QID) | ORAL | Status: DC | PRN

## 2019-03-29 MED ORDER — LABETALOL HCL 5 MG/ML IV SOLN: 10.0000 mg | Freq: Four times a day (QID) | INTRAVENOUS | Status: DC | PRN

## 2019-03-29 MED ORDER — LEVETIRACETAM IN NACL 1500 MG/100ML IV SOLN
1500.0000 mg | Freq: Two times a day (BID) | INTRAVENOUS | Status: DC
  Administered 2019-03-29 – 2019-03-31 (×4): 1500 mg via INTRAVENOUS
  Filled 2019-03-29 (×5): qty 100

## 2019-03-29 MED ORDER — CLINDAMYCIN PHOSPHATE 600 MG/50ML IV SOLN
600.0000 mg | Freq: Once | INTRAVENOUS | Status: AC
  Administered 2019-03-29: 14:00:00 600 mg via INTRAVENOUS
  Filled 2019-03-29: qty 50

## 2019-03-29 MED ORDER — SODIUM CHLORIDE 0.9 % IV SOLN
3.0000 g | Freq: Four times a day (QID) | INTRAVENOUS | Status: DC
  Administered 2019-03-29 – 2019-04-03 (×20): 3 g via INTRAVENOUS
  Filled 2019-03-29 (×2): qty 3
  Filled 2019-03-29: qty 8
  Filled 2019-03-29: qty 3
  Filled 2019-03-29 (×2): qty 8
  Filled 2019-03-29: qty 3
  Filled 2019-03-29: qty 8
  Filled 2019-03-29: qty 3
  Filled 2019-03-29: qty 8
  Filled 2019-03-29 (×2): qty 3
  Filled 2019-03-29: qty 8
  Filled 2019-03-29 (×2): qty 3
  Filled 2019-03-29 (×3): qty 8
  Filled 2019-03-29 (×3): qty 3
  Filled 2019-03-29 (×2): qty 8
  Filled 2019-03-29: qty 3
  Filled 2019-03-29: qty 8

## 2019-03-29 MED ORDER — SODIUM CHLORIDE 0.9 % IV SOLN
300.0000 mg | Freq: Two times a day (BID) | INTRAVENOUS | Status: DC
  Administered 2019-03-29 – 2019-03-31 (×4): 300 mg via INTRAVENOUS
  Filled 2019-03-29 (×6): qty 30

## 2019-03-29 MED ORDER — LACTATED RINGERS IV BOLUS
1000.0000 mL | Freq: Once | INTRAVENOUS | Status: AC
  Administered 2019-03-29: 1000 mL via INTRAVENOUS

## 2019-03-29 MED ORDER — IPRATROPIUM-ALBUTEROL 0.5-2.5 (3) MG/3ML IN SOLN
3.0000 mL | RESPIRATORY_TRACT | Status: DC
  Administered 2019-03-29 – 2019-03-30 (×2): 3 mL via RESPIRATORY_TRACT
  Filled 2019-03-29 (×2): qty 3

## 2019-03-29 MED ORDER — SODIUM CHLORIDE 0.9 % IV SOLN: INTRAVENOUS | Status: DC

## 2019-03-29 MED ORDER — ONDANSETRON HCL 4 MG/2ML IJ SOLN: 4.0000 mg | Freq: Four times a day (QID) | INTRAMUSCULAR | Status: DC | PRN

## 2019-03-29 MED ORDER — KETOROLAC TROMETHAMINE 30 MG/ML IJ SOLN
30.0000 mg | Freq: Once | INTRAMUSCULAR | Status: AC
  Administered 2019-03-29: 30 mg via INTRAVENOUS
  Filled 2019-03-29: qty 1

## 2019-03-29 MED ORDER — ENOXAPARIN SODIUM 40 MG/0.4ML ~~LOC~~ SOLN
40.0000 mg | SUBCUTANEOUS | Status: DC
  Administered 2019-03-29 – 2019-04-02 (×4): 40 mg via SUBCUTANEOUS
  Filled 2019-03-29 (×5): qty 0.4

## 2019-03-29 MED ORDER — ALBUTEROL SULFATE HFA 108 (90 BASE) MCG/ACT IN AERS
4.0000 | INHALATION_SPRAY | Freq: Once | RESPIRATORY_TRACT | Status: AC
  Administered 2019-03-29: 4 via RESPIRATORY_TRACT
  Filled 2019-03-29: qty 6.7

## 2019-03-29 NOTE — ED Notes (Signed)
Pt sleeping  keppra has not been given  I found it swinging from the iv pole noen infused from it

## 2019-03-29 NOTE — ED Triage Notes (Signed)
Pt BIB [facility] from [facility]. Per EMS pt has history of food aspiration appears as such today. Staff found pt in room covered in vomit. Pt SpO2 89% RA upon EMS arrival. Pt is non-responsive @ baseline. VSS.

## 2019-03-29 NOTE — ED Notes (Signed)
Report given to rn on 3e 

## 2019-03-29 NOTE — Progress Notes (Signed)
Pharmacy Antibiotic Note  Lee [name] is a 69 y.o. male admitted on 03/29/2019 with pneumonia.  Pharmacy has been consulted for unasyn dosing. Pt is afebrile and WBC is elevated at 13.5. Scr is WNL.   Plan: Unasyn 3gm IV Q6H F/u renal fxn, C&S, clinical status *Pharmacy will sign off as no further dose adjustment are anticipated.   Height: 5\' 10"  (177.8 cm) Weight: 144 lb 2.9 oz (65.4 kg) IBW/kg (Calculated) : 73  Temp (24hrs), Avg:99.1 F (37.3 C), Min:98.8 F (37.1 C), Max:99.3 F (37.4 C)  Recent Labs  Lab 03/29/19 1318  WBC 13.5*  CREATININE 0.73    Estimated Creatinine Clearance: 80.6 mL/min (by C-G formula based on SCr of 0.73 mg/dL).    Allergies  Allergen Reactions  . Orange Juice [Orange Oil] Diarrhea  . Penicillins Nausea And Vomiting    Tolerates Zosyn. "might have been an overdose" (01/22/2012) Has patient had a PCN reaction causing immediate rash, facial/tongue/throat swelling, SOB or lightheadedness with hypotension: yes Has patient had a PCN reaction causing severe rash involving mucus membranes or skin necrosis: unknown Has patient had a PCN reaction that required hospitalization: no Has patient had a PCN reaction occurring within the last 10 years: no If all of the above answers are "NO", then may proceed with Cephalosporin u  . Vicodin [Hydrocodone-Acetaminophen] Other (See Comments)    "triggered seizure both here and at home when he tried to take it" (01/22/2012)  . Oxycodone     Causes Seizures    Thank you for allowing pharmacy to be a part of this patient's care.  Elain Wixon, 01/24/2012 03/29/2019 4:27 PM

## 2019-03-29 NOTE — ED Notes (Signed)
pts lying with both eyes closed groans when hes moved  Pt given hhn diaper changed urine only  Very unhappy when we tried to move him.  Rt arm in a bent position  ?? Normal for him
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
## 2019-03-29 NOTE — ED Notes (Signed)
Admitting doctor notified of lactic acid .  Orders to increase the iv fluid to 125/hr
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
## 2019-03-30 DIAGNOSIS — R0902 Hypoxemia: Secondary | ICD-10-CM

## 2019-03-30 DIAGNOSIS — I10 Essential (primary) hypertension: Secondary | ICD-10-CM

## 2019-03-30 DIAGNOSIS — J690 Pneumonitis due to inhalation of food and vomit: Secondary | ICD-10-CM

## 2019-03-30 DIAGNOSIS — R569 Unspecified convulsions: Secondary | ICD-10-CM

## 2019-03-30 LAB — BASIC METABOLIC PANEL
Anion gap: 12 (ref 5–15)
BUN: 14 mg/dL (ref 8–23)
CO2: 25 mmol/L (ref 22–32)
Calcium: 8.6 mg/dL — ABNORMAL LOW (ref 8.9–10.3)
Chloride: 106 mmol/L (ref 98–111)
Creatinine, Ser: 0.69 mg/dL (ref 0.61–1.24)
GFR calc Af Amer: >60 mL/min (ref >60)
GFR calc non Af Amer: >60 mL/min (ref >60)
Glucose, Bld: 88 mg/dL (ref 70–99)
Potassium: 4.4 mmol/L (ref 3.5–5.1)
Sodium: 143 mmol/L (ref 135–145)

## 2019-03-30 LAB — CBC
HCT: 39.8 % (ref 39.0–52.0)
Hemoglobin: 13.1 g/dL (ref 13.0–17.0)
MCH: 32.9 pg (ref 26.0–34.0)
MCHC: 32.9 g/dL (ref 30.0–36.0)
MCV: 100 fL (ref 80.0–100.0)
Platelets: 203 10*3/uL (ref 150–400)
RBC: 3.98 MIL/uL — ABNORMAL LOW (ref 4.22–5.81)
RDW: 15.6 % — ABNORMAL HIGH (ref 11.5–15.5)
WBC: 15.1 10*3/uL — ABNORMAL HIGH (ref 4.0–10.5)
nRBC: 0 % (ref 0.0–0.2)

## 2019-03-30 MED ORDER — IPRATROPIUM-ALBUTEROL 0.5-2.5 (3) MG/3ML IN SOLN
3.0000 mL | Freq: Three times a day (TID) | RESPIRATORY_TRACT | Status: DC
  Administered 2019-03-30: 3 mL via RESPIRATORY_TRACT
  Filled 2019-03-30: qty 3

## 2019-03-30 MED ORDER — AMLODIPINE BESYLATE 10 MG PO TABS
10.0000 mg | ORAL_TABLET | Freq: Every day | ORAL | Status: DC
  Administered 2019-03-30 – 2019-04-03 (×5): 10 mg via ORAL
  Filled 2019-03-30 (×4): qty 1

## 2019-03-30 MED ORDER — CARBAMAZEPINE 100 MG/5ML PO SUSP
300.0000 mg | Freq: Two times a day (BID) | ORAL | Status: DC
  Administered 2019-03-30 – 2019-04-03 (×6): 300 mg via ORAL
  Filled 2019-03-30 (×9): qty 15

## 2019-03-30 MED ORDER — VALPROIC ACID 250 MG/5ML PO SOLN
750.0000 mg | Freq: Three times a day (TID) | ORAL | Status: DC
  Administered 2019-03-30 – 2019-04-02 (×10): 750 mg via ORAL
  Filled 2019-03-30 (×12): qty 15

## 2019-03-30 MED ORDER — HYDRALAZINE HCL 25 MG PO TABS
25.0000 mg | ORAL_TABLET | Freq: Three times a day (TID) | ORAL | Status: DC
  Administered 2019-03-30 – 2019-04-03 (×10): 25 mg via ORAL
  Filled 2019-03-30 (×12): qty 1

## 2019-03-30 MED ORDER — IPRATROPIUM-ALBUTEROL 0.5-2.5 (3) MG/3ML IN SOLN
3.0000 mL | Freq: Four times a day (QID) | RESPIRATORY_TRACT | Status: DC | PRN
  Administered 2019-04-01: 3 mL via RESPIRATORY_TRACT
  Filled 2019-03-30: qty 3

## 2019-03-30 MED ORDER — CLONAZEPAM 0.125 MG PO TBDP
0.2500 mg | ORAL_TABLET | Freq: Two times a day (BID) | ORAL | Status: DC
  Administered 2019-03-30 – 2019-04-03 (×8): 0.25 mg via ORAL
  Filled 2019-03-30 (×9): qty 2

## 2019-03-30 NOTE — TOC Progression Note (Addendum)
Transition of Care [facility]) - Progression Note    Patient Details  Name: HOLMAN BONSIGNORE MRN: [id number] Date of Birth: 05/07/1950  Transition of Care [facility] CM/SW Contact  Terrial Rhodes, LCSWA Phone Number: 03/30/2019, 2:16 PM  Clinical Narrative:      Update: Insurance still pending.With MD for review. [employer] will call CSW back with answer.   CSW started insurance auth for SNF placement. Ref# [id number].   Insurance auth pending. TOC team will continue to follow.    Expected Discharge Plan: Skilled Nursing Facility Barriers to Discharge: Continued Medical Work up  Expected Discharge Plan and Services Expected Discharge Plan: Skilled Nursing Facility     Post Acute Care Choice: Skilled Nursing Facility Living arrangements for the past 2 months: Skilled Nursing Facility([facility])                                       Social Determinants of Health (SDOH) Interventions    Readmission Risk Interventions No flowsheet data found.

## 2019-03-30 NOTE — TOC Transition Note (Deleted)
Transition of Care [facility] - CM/SW Discharge Note   Patient Details  Name: CEBASTIAN NEIS MRN: [id number] Date of Birth: Aug 06, 1950  Transition of Care [facility] CM/SW Contact:  Terrial Rhodes, LCSWA Phone Number: 03/30/2019, 9:49 AM   Clinical Narrative:     CSW spoke with patients wife [name]. Patients wife gave CSW permission for patient to go back to [facility]. Referral faxed to [facility]. Pending PT/OT notes for insurance authorization to be initiated for patients to return to [place].         Barriers to Discharge: Continued Medical Work up   Patient Goals and CMS Choice Patient states their goals for this hospitalization and ongoing recovery are:: go to short term rehab CMS Medicare.gov Compare Post Acute Care list provided to:: Patient Represenative (must comment)(Spouse [name]) Choice offered to / list presented to : Spouse([name])  Discharge Placement                       Discharge Plan and Services     Post Acute Care Choice: Skilled Nursing Facility                               Social Determinants of Health (SDOH) Interventions     Readmission Risk Interventions No flowsheet data found.

## 2019-03-30 NOTE — Plan of Care (Signed)
  Problem: Education: Goal: Knowledge of General Education information will improve Description: Including pain rating scale, medication(s)/side effects and non-pharmacologic comfort measures Outcome: Progressing   Problem: Health Behavior/Discharge Planning: Goal: Ability to manage health-related needs will improve Outcome: Progressing   Problem: Clinical Measurements: Goal: Ability to maintain clinical measurements within normal limits will improve Outcome: Progressing   Problem: Nutrition: Goal: Adequate nutrition will be maintained Outcome: Progressing   Problem: Skin Integrity: Goal: Risk for impaired skin integrity will decrease Outcome: Progressing   

## 2019-03-30 NOTE — Plan of Care (Signed)

## 2019-03-30 NOTE — Evaluation (Signed)
Occupational Therapy Evaluation Patient Details Name: Lee [name] MRN: [id number] DOB: January 05, 1951 Today's Date: 03/30/2019    History of Present Illness [facility] 69 [facility] with 02 sats 89% on RA. Dx: sepsis in setting of acute respiratory failure with hypoxemia due to aspiration PNA.  PMH includes:  HTN, hyperlipidemia, seizure disorder, h/o CVA, COPD, dementia, PVD   Clinical Impression   Pt admitted with the above diagnosis.  He is a long term resident of [facility], per chart review.   He currently requires total A for all aspects of ADLs and functional mobility, as he is unable to engage in activity, or follow commands.  PT attempted to contact [facility] to determine baseline, however, no answer.  Chart review, from previous admission, indicate pt is likely at his baseline level of functioning.  No further OT indicated at this time as he is unable to participate, and rehab potential is poor.  OT will sign off at this time.     Follow Up Recommendations  SNF(LTC )    Equipment Recommendations  None recommended by OT    Recommendations for Other Services       Precautions / Restrictions Precautions Precautions: Fall Restrictions Weight Bearing Restrictions: No      Mobility Bed Mobility Overal bed mobility: Needs Assistance Bed Mobility: Rolling Rolling: +2 for physical assistance;Total assist         General bed mobility comments: requires assist of 2 for all aspects of mobility.  Pt very rigid with resistance to all attempts at moving him   Transfers Overall transfer level: Needs assistance               General transfer comment: Pt unable to attempt     Balance                                           ADL either performed or assessed with clinical judgement   ADL Overall ADL's : Needs assistance/impaired                                        General ADL Comments: Pt requires total A for all aspects.  Unable to engage him in functional/purposeful activity      Vision   Additional Comments: Pt did not spontaneously track therapist      Perception     Praxis      Pertinent Vitals/Pain Pain Assessment: Faces Faces Pain Scale: Hurts little more Pain Location: generalized  Pain Descriptors / Indicators: Moaning Pain Intervention(s): Monitored during session;Repositioned;Limited activity within patient's tolerance     Hand Dominance Right(per chart review )   Extremity/Trunk Assessment Upper Extremity Assessment Upper Extremity Assessment: RUE deficits/detail;LUE deficits/detail RUE Deficits / Details: Pt with significant rigidity, and resistance to all attempts to move him.  He demonstrates very hard grasp, and would not release therapist's hand.  When his hand was free, he did spontaneoudly open Rt hand to attempt to grasp for bed linens RUE Coordination: decreased fine motor;decreased gross motor LUE Deficits / Details: Pt keeps elbow flexed.  He demonstrates rigidity, and resists all attempts to move lt UE.  He keeps it flexed across abdomen.  Palm protector in place, and was removed  with effort.  Palm and hand appears clean as does palm protector.  Palm protector reapplied.  LUE Coordination: decreased fine motor;decreased gross motor   Lower Extremity Assessment Lower Extremity Assessment: Defer to PT evaluation   Cervical / Trunk Assessment Cervical / Trunk Assessment: Other exceptions Cervical / Trunk Exceptions: rigidity noted    Communication Communication Communication: Receptive difficulties;Expressive difficulties(will occasionally answer a question )   Cognition Arousal/Alertness: Awake/alert;Lethargic Behavior During Therapy: Restless Overall Cognitive Status: History of cognitive impairments - at baseline                                 General Comments: Pt will answer occasional  questions in socal context, like "I'm fine" in response to "how are you", however, very limited talking.  He mostly moans with any attempt to move or touch him.  He followed no commands, and does not visually track therapists in the room.  He does not engage with objects or people in his environment.  He does grasp very hard with the Rt hand, and is unable to let go on command.  Per chart review, this appears to be his baseline behavior based on previous admissions    General Comments  HR 86     Exercises     Shoulder Instructions      Home Living Family/patient expects to be discharged to:: Skilled nursing facility                                 Additional Comments: PT attempted to contact [facility] without success due to no answer.  Chart reviewed, and it appears the PT who saw pt  during last admission in 2/21 did speak with staff at [facility] and they reported he was total A for all care and mobility.        Prior Functioning/Environment Level of Independence: Needs assistance  Gait / Transfers Assistance Needed: Per chart review, pt was non ambulatory  ADL's / Homemaking Assistance Needed: Per chart review, pt total A for all ADLs             OT Problem List: Decreased range of motion;Decreased activity tolerance;Impaired balance (sitting and/or standing);Impaired vision/perception;Decreased coordination;Decreased cognition;Decreased safety awareness;Impaired tone;Impaired UE functional use;Pain      OT Treatment/Interventions:      OT Goals(Current goals can be found in the care plan section) Acute Rehab OT Goals Patient Stated Goal: Pt unable  OT Goal Formulation: All assessment and education complete, DC therapy  OT Frequency:     Barriers to D/C:            Co-evaluation PT/OT/SLP Co-Evaluation/Treatment: Yes Reason for Co-Treatment: Complexity of the patient's impairments (multi-system involvement);Necessary to address cognition/behavior during  functional activity;For patient/therapist safety   OT goals addressed during session: ADL's and self-care      AM-PAC OT "6 Clicks" Daily Activity     Outcome Measure Help from another person eating meals?: Total Help from another person taking care of personal grooming?: Total Help from another person toileting, which includes using toliet, bedpan, or urinal?: Total Help from another person bathing (including washing, rinsing, drying)?: Total Help from another person to put on and taking off regular upper body clothing?: Total Help from another person to put on and taking off regular lower body clothing?: Total 6 Click Score: 6   End of Session Equipment  Utilized During Treatment: Oxygen Nurse Communication: Other (comment)(results of eval )  Activity Tolerance: Other (comment)(cognition ) Patient left: in bed;with call bell/phone within reach  OT Visit Diagnosis: Pain;Apraxia (R48.2);Cognitive communication deficit (R41.841);Muscle weakness (generalized) (M62.81) Pain - part of body: (generalized )                Time: 0903-0149 OT Time Calculation (min): 15 min Charges:  OT General Charges $OT Visit: 1 Visit OT Evaluation $OT Eval Moderate Complexity: 1 Mod  Eber Jones., OTR/L Acute Rehabilitation Services Pager [phone] Office [phone]   Jeani Hawking M 03/30/2019, 11:28 AM
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
## 2019-03-30 NOTE — Progress Notes (Signed)
[facility]  PROGRESS NOTE  Lee [name] LJQ:[id number] DOB: Oct 08, 1950 DOA: 03/29/2019 PCP: Mirna Mires, MD   Brief HPI:   69 year old male with a history of seizure disorder, stroke, COPD, dementia, alcohol abuse, hypertension who was brought to the hospital after patient was found in vomitus and with shortness of breath.  O2 sats ointment placed on room air.  Patient was brought to the ED by ambulance for concern for aspiration pneumonia.  Patient is currently residing at [facility].    Subjective   Patient seen and examined, alert, nonverbal.   Assessment/Plan:     1. Acute respiratory failure with hypoxia-due to aspiration pneumonia.  Patient presented with tachypnea, tachycardia, leukocytosis.  Chest x-ray showed resolving multi lobar bibasilar pneumonia.  Concern for aspiration pneumonia.  Continue IV Unasyn.  SARS COVID-19 RT-PCR is negative.  Speech therapy consulted. 2. Seizure disorder-continue Keppra, Vimpat, valproic acid, carbamazepine 3. Dysphagia-speech therapy evaluated the patient, started on dysphagia 1 diet with thin liquids. 4. COPD-stable, continue DuoNeb as needed 5. Dementia-no behavior disturbance, supportive care. 6. Hypertension-blood pressure stable, continue amlodipine, hydralazine.      SpO2: 98 % O2 Flow Rate (L/min): 4 L/min   COVID-19 Labs  No results for input(s): DDIMER, FERRITIN, LDH, CRP in the last 72 hours.  Lab Results  Component Value Date   SARSCOV2NAA NEGATIVE 03/29/2019   SARSCOV2NAA NEGATIVE 03/05/2019   SARSCOV2NAA NEGATIVE 02/23/2019   SARSCOV2NAA NEGATIVE 08/08/2018     CBG: No results for input(s): GLUCAP in the last 168 hours.  CBC: Recent Labs  Lab 03/29/19 1318 03/29/19 1518 03/29/19 1757 03/30/19 0422  WBC 13.5*  --  12.6* 15.1*  NEUTROABS 12.1*  --   --   --   HGB 15.8 14.6 13.8 13.1  HCT 48.7 43.0 42.5 39.8  MCV 102.5*  --  101.9* 100.0  PLT 286  --  232 203    Basic Metabolic  Panel: Recent Labs  Lab 03/29/19 1318 03/29/19 1518 03/29/19 1757 03/30/19 0422  NA 143 141  --  143  K 4.5 3.8  --  4.4  CL 101  --   --  106  CO2 25  --   --  25  GLUCOSE 111*  --   --  88  BUN 14  --   --  14  CREATININE 0.73  --  0.85 0.69  CALCIUM 9.3  --   --  8.6*  MG  --   --  1.5*  --      Liver Function Tests: Recent Labs  Lab 03/29/19 1318  AST 38  ALT 32  ALKPHOS 135*  BILITOT 0.5  PROT 7.8  ALBUMIN 3.3*        DVT prophylaxis: Lovenox  Code Status: Full code  Family Communication: No family at bedside  Disposition Plan: Patient admitted with aspiration pneumonia.  Barrier to discharge-IV antibiotics for aspiration pneumonia.  Pressure Injury 03/01/19 Heel Left Deep Tissue Pressure Injury - Purple or maroon localized area of discolored intact skin or blood-filled blister due to damage of underlying soft tissue from pressure and/or shear. (Active)  03/01/19 0800  Location: Heel  Location Orientation: Left  Staging: Deep Tissue Pressure Injury - Purple or maroon localized area of discolored intact skin or blood-filled blister due to damage of underlying soft tissue from pressure and/or shear.  Wound Description (Comments):   Present on Admission: No     Pressure Injury 03/04/19 Sacrum Mid Stage 2 -  Partial thickness loss  of dermis presenting as a shallow open injury with a red, pink wound bed without slough. (Active)  03/04/19 1300  Location: Sacrum  Location Orientation: Mid  Staging: Stage 2 -  Partial thickness loss of dermis presenting as a shallow open injury with a red, pink wound bed without slough.  Wound Description (Comments):   Present on Admission: No        Scheduled medications:  . carBAMazepine  300 mg Oral BID  . clonazepam  0.25 mg Oral BID  . enoxaparin (LOVENOX) injection  40 mg Subcutaneous Q24H  . valproic acid  750 mg Oral TID    Consultants:    Procedures:    Antibiotics:   Anti-infectives (From  admission, onward)   Start     Dose/Rate Route Frequency Ordered Stop   03/29/19 1700  Ampicillin-Sulbactam (UNASYN) 3 g in sodium chloride 0.9 % 100 mL IVPB     3 g 200 mL/hr over 30 Minutes Intravenous Every 6 hours 03/29/19 1627     03/29/19 1315  clindamycin (CLEOCIN) IVPB 600 mg     600 mg 100 mL/hr over 30 Minutes Intravenous  Once 03/29/19 1300 03/29/19 1419       Objective   Vitals:   03/30/19 0406 03/30/19 0715 03/30/19 0729 03/30/19 1125  BP: (!) 144/81  123/68 (!) 147/81  Pulse: 92  92 85  Resp: 20   18  Temp: 97.9 F (36.6 C)  98 F (36.7 C) 98 F (36.7 C)  TempSrc: Oral  Oral Oral  SpO2: 100% 100% 100% 98%  Weight:      Height:        Intake/Output Summary (Last 24 hours) at 03/30/2019 1610 Last data filed at 03/30/2019 1200 Gross per 24 hour  Intake 1957.88 ml  Output 200 ml  Net 1757.88 ml    03/20 1901 - 03/22 0700 In: 1581.6 [I.V.:1237.1] Out: 200 [Urine:200]  Filed Weights   03/29/19 1600 03/30/19 0028  Weight: 65.4 kg 56 kg    Physical Examination:   General-appears in no acute distress  Heart-S1-S2, regular, no murmur auscultated  Lungs-clear to auscultation bilaterally, no wheezing or crackles auscultated  Abdomen-soft, nontender, no organomegaly  Extremities-no edema in the lower extremities  Neuro-alert, nonverbal, does not follow commands    Data Reviewed:   Recent Results (from the past 240 hour(s))  SARS CORONAVIRUS 2 (TAT 6-24 HRS) Nasopharyngeal Nasopharyngeal Swab     Status: None   Collection Time: 03/29/19  3:12 PM   Specimen: Nasopharyngeal Swab  Result Value Ref Range Status   SARS Coronavirus 2 NEGATIVE NEGATIVE Final    Comment: (NOTE) SARS-CoV-2 target nucleic acids are NOT DETECTED. The SARS-CoV-2 RNA is generally detectable in upper and lower respiratory specimens during the acute phase of infection. Negative results do not preclude SARS-CoV-2 infection, do not rule out co-infections with other pathogens,  and should not be used as the sole basis for treatment or other patient management decisions. Negative results must be combined with clinical observations, patient history, and epidemiological information. The expected result is Negative. Fact Sheet for Patients: HairSlick.no Fact Sheet for Healthcare Providers: quierodirigir.com This test is not yet approved or cleared by the [place] FDA and  has been authorized for detection and/or diagnosis of SARS-CoV-2 by FDA under an Emergency Use Authorization (EUA). This EUA will remain  in effect (meaning this test can be used) for the duration of the COVID-19 declaration under Section 56 4(b)(1) of the Act, 21 U.S.C. section 360bbb-3(b)(1),  unless the authorization is terminated or revoked sooner. Performed at [facility], [place] [address] [place], [place] [address]   Blood culture (routine x 2)     Status: None (Preliminary result)   Collection Time: 03/29/19  3:53 PM   Specimen: BLOOD RIGHT FOREARM  Result Value Ref Range Status   Specimen Description BLOOD RIGHT FOREARM  Final   Special Requests   Final    BOTTLES DRAWN AEROBIC AND ANAEROBIC Blood Culture adequate volume   Culture   Final    NO GROWTH < 24 HOURS Performed at [facility], [place] [address], [place], [place] [address]    Report Status PENDING  Incomplete  Blood culture (routine x 2)     Status: None (Preliminary result)   Collection Time: 03/29/19  3:58 PM   Specimen: BLOOD RIGHT HAND  Result Value Ref Range Status   Specimen Description BLOOD RIGHT HAND  Final   Special Requests   Final    BOTTLES DRAWN AEROBIC AND ANAEROBIC Blood Culture results may not be optimal due to an inadequate volume of blood received in culture bottles   Culture   Final    NO GROWTH < 24 HOURS Performed at [facility], [place] [address], [place], [place] [address]    Report Status PENDING  Incomplete    No  results for input(s): LIPASE, AMYLASE in the last 168 hours. No results for input(s): AMMONIA in the last 168 hours.  Cardiac Enzymes: No results for input(s): CKTOTAL, CKMB, CKMBINDEX, TROPONINI in the last 168 hours. BNP (last 3 results) Recent Labs    02/23/19 1626 03/29/19 1318  BNP 76.6 62.9    ProBNP (last 3 results) No results for input(s): PROBNP in the last 8760 hours.  Studies:  DG Chest Port 1 View  Result Date: 03/29/2019 CLINICAL DATA:  69 year old male with history of shortness of breath. EXAM: PORTABLE CHEST 1 VIEW COMPARISON:  Chest x-ray 03/04/2019. FINDINGS: Improving aeration in the lung bases bilaterally, most compatible with resolving multilobar pneumonia. There continues to be some ill-defined bibasilar opacities (left greater than right). No pleural effusions. No pneumothorax. No definite suspicious appearing pulmonary nodules or masses are noted. No evidence of pulmonary edema. Heart size is normal. Upper mediastinal contours are within normal limits. Aortic atherosclerosis. IMPRESSION: 1. Resolving multilobar bibasilar pneumonia, as above. 2. Aortic atherosclerosis. Electronically Signed   By: Vinnie Langton M.D.   On: 03/29/2019 13:32     Admission status: The appropriate admission status for this patient is INPATIENT. Inpatient status is judged to be reasonable and necessary in order to provide the required intensity of service to ensure the patient's safety. The patient's presenting symptoms, physical exam findings, and initial radiographic and laboratory data in the context of their chronic comorbidities is felt to place them at high risk for further clinical deterioration. Furthermore, it is not anticipated that the patient will be medically stable for discharge from the hospital within 2 midnights of admission. The following factors support the admission status of inpatient.    The patient's presenting symptoms include hypoxia, dyspnea The worrisome  physical exam findings include hypoxia. The initial radiographic and laboratory data are worrisome because of aspiration pneumonia The chronic co-morbidities include seizure disorder    * I certify that at the point of admission it is my clinical judgment that the patient will require inpatient hospital care spanning beyond 2 midnights from the point of admission due to high intensity of service, high  risk for further deterioration and high frequency of surveillance required.Meredeth Ide   [facility] If 7PM-7AM, please contact night-coverage at [email or web address], Office  [phone]   03/30/2019, 4:10 PM  LOS: 1 day

## 2019-03-30 NOTE — Evaluation (Signed)
Physical Therapy Evaluation Patient Details Name: Lee [name] MRN: [id number] DOB: April 30, 1950 Today's Date: 03/30/2019   History of Present Illness  This 69 y.o. male admitted from [facility] he was found covered in vomit and SOB with 02 sats 89% on RA. Dx: sepsis in setting of acute respiratory failure with hypoxemia due to aspiration PNA.  PMH includes:  HTN, hyperlipidemia, seizure disorder, h/o CVA, COPD, dementia, PVD  Clinical Impression  Patient is a long term resident of [facility], per chart review. Pt currently requires total A for all mobility/care, does not follow commands and cannot participate/engage purposefully with therapists. Pt moaning in pain and resisting all attempted movements. Placed pillows for positioning and to prevent skin breakdown. Attempted to contact [facility] however no answer at this time so unable to determine baseline.  Chart review from previous admission indicates that pt is likely at functional/cognitive baseline today. Pt does not require further skilled therapy services due to above. Will sign off. Thanks.     Follow Up Recommendations SNF(return to [facility])    Equipment Recommendations  None recommended by PT    Recommendations for Other Services       Precautions / Restrictions Precautions Precautions: Fall Restrictions Weight Bearing Restrictions: No      Mobility  Bed Mobility Overal bed mobility: Needs Assistance Bed Mobility: Rolling Rolling: +2 for physical assistance;Total assist         General bed mobility comments: requires assist of 2 for all aspects of mobility.  Pt very rigid with resistance to all attempts at moving him   Transfers Overall transfer level: Needs assistance               General transfer comment: Pt unable to attempt   Ambulation/Gait                Stairs            Wheelchair Mobility    Modified Rankin (Stroke Patients Only)       Balance                                             Pertinent Vitals/Pain Pain Assessment: Faces Faces Pain Scale: Hurts little more Pain Location: generalized  Pain Descriptors / Indicators: Moaning Pain Intervention(s): Limited activity within patient's tolerance;Repositioned;Monitored during session    Home Living Family/patient expects to be discharged to:: Skilled nursing facility                 Additional Comments: PT attempted to contact [facility] without success due to no answer.  Chart reviewed, and it appears the PT who saw pt  during last admission in 2/21 did speak with staff at [facility] and they reported he was total A for all care and mobility.      Prior Function Level of Independence: Needs assistance   Gait / Transfers Assistance Needed: Per chart review, pt was non ambulatory   ADL's / Homemaking Assistance Needed: Per chart review, pt total A for all ADLs         Hand Dominance   Dominant Hand: Right    Extremity/Trunk Assessment   Upper Extremity Assessment Upper Extremity Assessment: Defer to OT evaluation RUE Deficits / Details: Pt with significant rigidity, and resistance to all attempts to move him.  He demonstrates very hard grasp, and would not release therapist's  hand.  When his hand was free, he did spontaneoudly open Rt hand to attempt to grasp for bed linens RUE Coordination: decreased fine motor;decreased gross motor LUE Deficits / Details: Pt keeps elbow flexed.  He demonstrates rigidity, and resists all attempts to move lt UE.  He keeps it flexed across abdomen.  Palm protector in place, and was removed with effort.  Palm and hand appears clean as does palm protector.  Palm protector reapplied.  LUE Coordination: decreased fine motor;decreased gross motor    Lower Extremity Assessment Lower Extremity Assessment: RLE deficits/detail;LLE deficits/detail;Difficult to assess due to impaired cognition RLE Deficits / Details: Pt with  significant rigidity/tone, positioned in knee flexion, able to extend LE spontaneously. Significant adductor tightness. RLE Coordination: decreased fine motor;decreased gross motor LLE Deficits / Details: Pt with significant rigidity/tone, positioned in knee flexion, able to extend LLE<RLE spontaneously. Significant adductor tightness. LLE Coordination: decreased fine motor;decreased gross motor    Cervical / Trunk Assessment Cervical / Trunk Assessment: Other exceptions Cervical / Trunk Exceptions: rigidity noted   Communication   Communication: Receptive difficulties;Expressive difficulties  Cognition Arousal/Alertness: Awake/alert;Lethargic Behavior During Therapy: Restless Overall Cognitive Status: History of cognitive impairments - at baseline                                 General Comments: Pt will answer occasional questions in socal context, like "I'm fine" in response to "how are you", however, very limited talking.  He mostly moans with any attempt to move or touch him.  He followed no commands, and does not visually track therapists in the room.  He does not engage with objects or people in his environment.  He does grasp very hard with the Rt hand, and is unable to let go on command.  Per chart review, this appears to be his baseline behavior based on previous admissions       General Comments General comments (skin integrity, edema, etc.): HR 86 bpm. Has Prevalon boots donned    Exercises     Assessment/Plan    PT Assessment Patent does not need any further PT services  PT Problem List         PT Treatment Interventions      PT Goals (Current goals can be found in the Care Plan section)  Acute Rehab PT Goals Patient Stated Goal: Pt unable  PT Goal Formulation: All assessment and education complete, DC therapy    Frequency     Barriers to discharge        Co-evaluation PT/OT/SLP Co-Evaluation/Treatment: Yes Reason for Co-Treatment: Complexity  of the patient's impairments (multi-system involvement);Necessary to address cognition/behavior during functional activity;For patient/therapist safety PT goals addressed during session: Strengthening/ROM OT goals addressed during session: ADL's and self-care       AM-PAC PT "6 Clicks" Mobility  Outcome Measure Help needed turning from your back to your side while in a flat bed without using bedrails?: Total Help needed moving from lying on your back to sitting on the side of a flat bed without using bedrails?: Total Help needed moving to and from a bed to a chair (including a wheelchair)?: Total Help needed standing up from a chair using your arms (e.g., wheelchair or bedside chair)?: Total Help needed to walk in hospital room?: Total Help needed climbing 3-5 steps with a railing? : Total 6 Click Score: 6    End of Session   Activity Tolerance: Treatment limited  secondary to medical complications (Comment);Patient limited by pain(cognition) Patient left: in bed;with call bell/phone within reach;with bed alarm set Nurse Communication: Mobility status;Need for lift equipment PT Visit Diagnosis: Muscle weakness (generalized) (M62.81)    Time: 2671-2458 PT Time Calculation (min) (ACUTE ONLY): 15 min   Charges:   PT Evaluation $PT Eval Moderate Complexity: 1 Mod          Vale Haven, PT, DPT Acute Rehabilitation Services Pager [phone] Office [phone]      Blake Divine A Lanier Ensign 03/30/2019, 12:16 PM

## 2019-03-30 NOTE — TOC Initial Note (Signed)
Transition of Care [facility]) - Initial/Assessment Note    Patient Details  Name: Lee [name] MRN: [id number] Date of Birth: 1950-04-10  Transition of Care [facility]) CM/SW Contact:    Trula Ore, [place] Phone Number: 03/30/2019, 10:15 AM  Clinical Narrative:                  CSW spoke with patients wife [name]. Patients wife gave CSW permission for patient to go back to [facility]. Referral faxed to [facility]. Pending PT/OT notes for insurance authorization to be initiated for patients to return to [place].    Expected Discharge Plan: Skilled Nursing Facility Barriers to Discharge: Continued Medical Work up   Patient Goals and CMS Choice Patient states their goals for this hospitalization and ongoing recovery are:: go to short term rehab CMS Medicare.gov Compare Post Acute Care list provided to:: Patient Represenative (must comment)(Spouse [name]) Choice offered to / list presented to : Spouse([name])  Expected Discharge Plan and Services Expected Discharge Plan: [place] Choice: [place] Living arrangements for the past 2 months: Skilled Nursing Facility([facility])                                      Prior Living Arrangements/Services Living arrangements for the past 2 months: Skilled Nursing Facility([facility]) Lives with:: Self Patient language and need for interpreter reviewed:: Yes Do you feel safe going back to the place where you live?: Yes      Need for Family Participation in Patient Care: Yes (Comment) Care giver support system in place?: Yes (comment)   Criminal Activity/Legal Involvement Pertinent to Current Situation/Hospitalization: No - Comment as needed  Activities of Daily Living      Permission Sought/Granted Permission sought to share information with : Family Supports, Case Freight forwarder, Chartered certified accountant granted to share information with : Yes, Verbal  Permission Granted  Share Information with NAME: [name]  Permission granted to share info w AGENCY: [place] granted to share info w Relationship: Spouse  Permission granted to share info w Contact Information: Jeannene Patella [phone]  Emotional Assessment       Orientation: : (Disoriented) Alcohol / Substance Use: Not Applicable Psych Involvement: No (comment)  Admission diagnosis:  Hypoxia [R09.02] Acute respiratory failure with hypoxia ([place]) [J96.01] Dysphagia, unspecified type [R13.10] Patient Active Problem List   Diagnosis Date Noted  . Sepsis ([place]) 03/29/2019  . Macrocytosis   . Protein-calorie malnutrition, severe 03/04/2019  . Acute respiratory failure with hypoxemia ([place]) 02/23/2019  . Pressure injury of skin 06/30/2018  . Acute respiratory failure with hypoxia ([place])   . Seizure disorder ([place]) 06/25/2018  . Seizure ([place]) 08/29/2016  . Leukocytosis 08/29/2016  . History of CVA (cerebrovascular accident) 08/29/2016  . Somnolence 08/29/2016  . Post-ictal state ([place]) 08/29/2016  . Lip laceration   . Pain   . Leg weakness, bilateral   . Leg pain, bilateral 08/17/2016  . UTI (urinary tract infection) 08/15/2016  . Aggression 07/26/2016  . Dementia ([place]) 07/26/2016  . Elevated troponin 12/30/2015  . Chest pain 12/30/2015  . Aspiration pneumonia ([place]) 11/15/2013  . Status epilepticus ([place]) 11/14/2013  . Essential hypertension 11/14/2013  . Cerebral thrombosis with cerebral infarction ([place]) 11/07/2013  . Seizures ([place]) 11/06/2013  . Chronic airway obstruction, not elsewhere classified 09/22/2013  . Stroke ([place]) 09/22/2013  . Hyperlipidemia 09/18/2013  .  Encephalopathy acute 09/11/2013  . Altered mental status 09/11/2013  . CVA (cerebral infarction) 09/11/2013  . Hyponatremia 09/11/2013  . Acute encephalopathy 09/11/2013  . Aftercare following surgery of the circulatory system, NEC 08/25/2012  . Foot swelling 02/04/2012  . Drainage from wound 01/04/2012   . Peripheral vascular disease (HCC) 11/05/2011  . Pain in limb 11/05/2011  . 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 PAD (peripheral artery disease) (HCC) 10/28/2011  . TOBACCO ABUSE 02/06/2007  . SYMPTOM, EDEMA 06/13/2006  . ERECTILE DYSFUNCTION 01/25/2006   PCP:  Mirna Mires, MD Pharmacy:   [facility] [place], [place] - [address] [place] [address] Phone: [phone] Fax: [phone]     Social Determinants of Health (SDOH) Interventions    Readmission Risk Interventions No flowsheet data found.

## 2019-03-30 NOTE — Progress Notes (Signed)
RN returned call to patient's wife, Lee [name] requesting an update, RN provided update on patient plan of care. All questions and concerns were answered.

## 2019-03-30 NOTE — NC FL2 (Signed)
[place] LEVEL OF CARE SCREENING TOOL     IDENTIFICATION  Patient Name: Lee [name] Birthdate: 1950/11/11 Sex: male Admission Date (Current Location): 03/29/2019  [facility] and [place] Number:  [name] and Address:  [facility], [place] [address], [place], [place] [address]      Provider Number: [id number]  Attending Physician Name and Address:  Oswald Hillock, MD  Relative Name and Phone Number:  Jeannene Patella [phone]    Current Level of Care: Hospital Recommended Level of Care: [place] Prior Approval Number:    Date Approved/Denied: 09/17/16 PASRR Number: [id number]  Discharge Plan: SNF    Current Diagnoses: Patient Active Problem List   Diagnosis Date Noted  . Sepsis ([place]) 03/29/2019  . Macrocytosis   . Protein-calorie malnutrition, severe 03/04/2019  . Acute respiratory failure with hypoxemia ([place]) 02/23/2019  . Pressure injury of skin 06/30/2018  . Acute respiratory failure with hypoxia ([place])   . Seizure disorder ([place]) 06/25/2018  . Seizure ([place]) 08/29/2016  . Leukocytosis 08/29/2016  . History of CVA (cerebrovascular accident) 08/29/2016  . Somnolence 08/29/2016  . Post-ictal state ([place]) 08/29/2016  . Lip laceration   . Pain   . Leg weakness, bilateral   . Leg pain, bilateral 08/17/2016  . UTI (urinary tract infection) 08/15/2016  . Aggression 07/26/2016  . Dementia ([place]) 07/26/2016  . Elevated troponin 12/30/2015  . Chest pain 12/30/2015  . Aspiration pneumonia ([place]) 11/15/2013  . Status epilepticus ([place]) 11/14/2013  . Essential hypertension 11/14/2013  . Cerebral thrombosis with cerebral infarction ([place]) 11/07/2013  . Seizures ([place]) 11/06/2013  . Chronic airway obstruction, not elsewhere classified 09/22/2013  . Stroke ([place]) 09/22/2013  . Hyperlipidemia 09/18/2013  . Encephalopathy acute 09/11/2013  . Altered mental status 09/11/2013  . CVA (cerebral infarction) 09/11/2013  .  Hyponatremia 09/11/2013  . Acute encephalopathy 09/11/2013  . Aftercare following surgery of the circulatory system, [place] 08/25/2012  . Foot swelling 02/04/2012  . Drainage from wound 01/04/2012  . Peripheral vascular disease ([place]) 11/05/2011  . Pain in limb 11/05/2011  . Chronic total occlusion of artery of the extremities ([place]) 11/05/2011  . PAD (peripheral artery disease) ([place]) 10/28/2011  . TOBACCO ABUSE 02/06/2007  . SYMPTOM, EDEMA 06/13/2006  . ERECTILE DYSFUNCTION 01/25/2006    Orientation RESPIRATION BLADDER Height & Weight     (Disoriented)  O2(Nasal Cannula 4 liters) Incontinent Weight: 123 lb 7.3 oz (56 kg) Height:  5\' 10"  (177.8 cm)  BEHAVIORAL SYMPTOMS/MOOD NEUROLOGICAL BOWEL NUTRITION STATUS      Incontinent Diet(See Discharge Summary)  AMBULATORY STATUS COMMUNICATION OF NEEDS Skin   Extensive Assist Verbally Skin abrasions, Other (Comment)(Abrasion on right heel, Pressure Injury to left heel, Pressure injury stage 2 on Sacrum)                       Personal Care Assistance Level of Assistance  Bathing, Feeding, Dressing, Total care Bathing Assistance: Maximum assistance Feeding assistance: Maximum assistance Dressing Assistance: Maximum assistance Total Care Assistance: Maximum assistance   Functional Limitations Info  Sight, Hearing, Speech Sight Info: Adequate Hearing Info: Adequate Speech Info: Adequate    SPECIAL CARE FACTORS FREQUENCY  PT (By licensed PT), OT (By licensed OT)     PT Frequency: 5x min weekly OT Frequency: 5x min weekly            Contractures Contractures Info: Not present    Additional Factors Info  Code Status, Allergies Code  Status Info: FULL Allergies Info: Orange Juice, Penicillins,Vicodin hydrocodone-acetaminophen,Oxycodone           Current Medications (03/30/2019):  This is the current hospital active medication list Current Facility-Administered Medications  Medication Dose Route Frequency Provider Last Rate  Last Admin  . 0.9 %  sodium chloride infusion   Intravenous Continuous Pahwani, Rinka R, MD 100 mL/hr at 03/30/19 0153 New Bag at 03/30/19 0153  . Ampicillin-Sulbactam (UNASYN) 3 g in sodium chloride 0.9 % 100 mL IVPB  3 g Intravenous Q6H Rumbarger, Rachel L, RPH 200 mL/hr at 03/30/19 0442 3 g at 03/30/19 0442  . enoxaparin (LOVENOX) injection 40 mg  40 mg Subcutaneous Q24H Pahwani, Rinka R, MD   40 mg at 03/29/19 2040  . ipratropium-albuterol (DUONEB) 0.5-2.5 (3) MG/3ML nebulizer solution 3 mL  3 mL Nebulization Q6H PRN Sharl Ma, Sarina Ill, MD      . labetalol (NORMODYNE) injection 10 mg  10 mg Intravenous Q6H PRN Pahwani, Rinka R, MD      . lacosamide (VIMPAT) 300 mg in sodium chloride 0.9 % 25 mL IVPB  300 mg Intravenous Q12H Pahwani, Rinka R, MD 110 mL/hr at 03/30/19 0945 300 mg at 03/30/19 0945  . levETIRAcetam (KEPPRA) IVPB 1500 mg/ 100 mL premix  1,500 mg Intravenous Q12H Pahwani, Rinka R, MD 400 mL/hr at 03/30/19 0514 1,500 mg at 03/30/19 0514  . ondansetron (ZOFRAN) tablet 4 mg  4 mg Oral Q6H PRN Pahwani, Rinka R, MD       Or  . ondansetron (ZOFRAN) injection 4 mg  4 mg Intravenous Q6H PRN Pahwani, Rinka R, MD         Discharge Medications: Please see discharge summary for a list of discharge medications.  Relevant Imaging Results:  Relevant Lab Results:   Additional Information [id number]  Terrial Rhodes, LCSWA

## 2019-03-31 DIAGNOSIS — R1310 Dysphagia, unspecified: Secondary | ICD-10-CM

## 2019-03-31 DIAGNOSIS — I739 Peripheral vascular disease, unspecified: Secondary | ICD-10-CM

## 2019-03-31 MED ORDER — ENSURE ENLIVE PO LIQD
237.0000 mL | Freq: Two times a day (BID) | ORAL | Status: DC
  Administered 2019-03-31 – 2019-04-02 (×4): 237 mL via ORAL

## 2019-03-31 MED ORDER — ADULT MULTIVITAMIN W/MINERALS CH
1.0000 | ORAL_TABLET | Freq: Every day | ORAL | Status: DC
  Administered 2019-03-31 – 2019-04-03 (×4): 1 via ORAL
  Filled 2019-03-31 (×4): qty 1

## 2019-03-31 MED ORDER — LACOSAMIDE 50 MG PO TABS
300.0000 mg | ORAL_TABLET | Freq: Two times a day (BID) | ORAL | Status: DC
  Administered 2019-03-31 – 2019-04-02 (×4): 300 mg via ORAL
  Filled 2019-03-31 (×5): qty 6

## 2019-03-31 MED ORDER — LEVETIRACETAM 500 MG PO TABS
1500.0000 mg | ORAL_TABLET | Freq: Two times a day (BID) | ORAL | Status: DC
  Administered 2019-03-31: 1500 mg via ORAL
  Filled 2019-03-31 (×2): qty 3

## 2019-03-31 NOTE — Progress Notes (Signed)
[facility]  PROGRESS NOTE  Lee [name] KGY:[id number] DOB: [birth year] DOA: 03/29/2019 PCP: Mirna Mires, MD   Brief HPI:   69 year old male with a history of seizure disorder, stroke, COPD, dementia, alcohol abuse, hypertension who was brought to the hospital after patient was found in vomitus and with shortness of breath.  O2 sats ointment placed on room air.  Patient was brought to the ED by ambulance for concern for aspiration pneumonia.  Patient is currently residing at [facility].    Subjective   Patient seen and examined, alert, nonverbal.   Assessment/Plan:     1. Acute respiratory failure with hypoxia-due to aspiration pneumonia.  Patient presented with tachypnea, tachycardia, leukocytosis.  Chest x-ray showed resolving multi lobar bibasilar pneumonia.  Concern for aspiration pneumonia.  Continue IV Unasyn for total 7 days.  SARS COVID-19 RT-PCR is negative.  Speech therapy consulted, patient started on dysphagia 1 diet. 2. Seizure disorder-continue Keppra, Vimpat, valproic acid, carbamazepine 3. Dysphagia-speech therapy evaluated the patient, started on dysphagia 1 diet with thin liquids. 4. COPD-stable, continue DuoNeb as needed 5. Dementia-no behavior disturbance, supportive care. 6. Hypertension-blood pressure stable, continue amlodipine, hydralazine.      SpO2: 100 % O2 Flow Rate (L/min): 4 L/min   COVID-19 Labs  No results for input(s): DDIMER, FERRITIN, LDH, CRP in the last 72 hours.  Lab Results  Component Value Date   SARSCOV2NAA NEGATIVE 03/29/2019   SARSCOV2NAA NEGATIVE 03/05/2019   SARSCOV2NAA NEGATIVE 02/23/2019   SARSCOV2NAA NEGATIVE 08/08/2018     CBG: No results for input(s): GLUCAP in the last 168 hours.  CBC: Recent Labs  Lab 03/29/19 1318 03/29/19 1518 03/29/19 1757 03/30/19 0422  WBC 13.5*  --  12.6* 15.1*  NEUTROABS 12.1*  --   --   --   HGB 15.8 14.6 13.8 13.1  HCT 48.7 43.0 42.5 39.8  MCV 102.5*  --  101.9* 100.0   PLT 286  --  232 203    Basic Metabolic Panel: Recent Labs  Lab 03/29/19 1318 03/29/19 1518 03/29/19 1757 03/30/19 0422  NA 143 141  --  143  K 4.5 3.8  --  4.4  CL 101  --   --  106  CO2 25  --   --  25  GLUCOSE 111*  --   --  88  BUN 14  --   --  14  CREATININE 0.73  --  0.85 0.69  CALCIUM 9.3  --   --  8.6*  MG  --   --  1.5*  --      Liver Function Tests: Recent Labs  Lab 03/29/19 1318  AST 38  ALT 32  ALKPHOS 135*  BILITOT 0.5  PROT 7.8  ALBUMIN 3.3*        DVT prophylaxis: Lovenox  Code Status: Full code  Family Communication: No family at bedside  Disposition Plan: Patient admitted with aspiration pneumonia.  Barrier to discharge-IV antibiotics for aspiration pneumonia.  Pressure Injury 03/01/19 Heel Left Deep Tissue Pressure Injury - Purple or maroon localized area of discolored intact skin or blood-filled blister due to damage of underlying soft tissue from pressure and/or shear. (Active)  03/01/19 0800  Location: Heel  Location Orientation: Left  Staging: Deep Tissue Pressure Injury - Purple or maroon localized area of discolored intact skin or blood-filled blister due to damage of underlying soft tissue from pressure and/or shear.  Wound Description (Comments):   Present on Admission: No     Pressure Injury  03/04/19 Sacrum Mid Stage 2 -  Partial thickness loss of dermis presenting as a shallow open injury with a red, pink wound bed without slough. (Active)  03/04/19 1300  Location: Sacrum  Location Orientation: Mid  Staging: Stage 2 -  Partial thickness loss of dermis presenting as a shallow open injury with a red, pink wound bed without slough.  Wound Description (Comments):   Present on Admission: No     Pressure Injury 03/31/19 Heel Right Deep Tissue Pressure Injury - Purple or maroon localized area of discolored intact skin or blood-filled blister due to damage of underlying soft tissue from pressure and/or shear. black (Active)   03/31/19 0902  Location: Heel  Location Orientation: Right  Staging: Deep Tissue Pressure Injury - Purple or maroon localized area of discolored intact skin or blood-filled blister due to damage of underlying soft tissue from pressure and/or shear.  Wound Description (Comments): black  Present on Admission: Yes (old dressing from facility)        Scheduled medications:  . amLODipine  10 mg Oral Daily  . carBAMazepine  300 mg Oral BID  . clonazepam  0.25 mg Oral BID  . enoxaparin (LOVENOX) injection  40 mg Subcutaneous Q24H  . feeding supplement (ENSURE ENLIVE)  237 mL Oral BID BM  . hydrALAZINE  25 mg Oral Q8H  . lacosamide  300 mg Oral BID  . levETIRAcetam  1,500 mg Oral BID  . multivitamin with minerals  1 tablet Oral Daily  . valproic acid  750 mg Oral TID    Consultants:    Procedures:    Antibiotics:   Anti-infectives (From admission, onward)   Start     Dose/Rate Route Frequency Ordered Stop   03/29/19 1700  Ampicillin-Sulbactam (UNASYN) 3 g in sodium chloride 0.9 % 100 mL IVPB     3 g 200 mL/hr over 30 Minutes Intravenous Every 6 hours 03/29/19 1627     03/29/19 1315  clindamycin (CLEOCIN) IVPB 600 mg     600 mg 100 mL/hr over 30 Minutes Intravenous  Once 03/29/19 1300 03/29/19 1419       Objective   Vitals:   03/31/19 0043 03/31/19 0426 03/31/19 0755 03/31/19 0940  BP: (!) 142/84 131/83 (!) 141/77 139/81  Pulse: 81 84 74 76  Resp: 16 16 18    Temp: 98.8 F (37.1 C) 99.1 F (37.3 C) 97.7 F (36.5 C)   TempSrc: Oral Oral Oral   SpO2: 100% 100% 100%   Weight: 59.4 kg     Height:        Intake/Output Summary (Last 24 hours) at 03/31/2019 1442 Last data filed at 03/31/2019 1300 Gross per 24 hour  Intake 2144.39 ml  Output 300 ml  Net 1844.39 ml    03/21 1901 - 03/23 0700 In: 3522.3 [P.O.:480; I.V.:1513.4] Out: 500 [Urine:500]  Filed Weights   03/29/19 1600 03/30/19 0028 03/31/19 0043  Weight: 65.4 kg 56 kg 59.4 kg    Physical  Examination:  General-appears in no acute distress Heart-S1-S2, regular, no murmur auscultated Lungs-clear to auscultation bilaterally, no wheezing or crackles auscultated Abdomen-soft, nontender, no organomegaly Extremities-no edema in the lower extremities Neuro-alert, nonverbal, does not follow commands   Data Reviewed:   Recent Results (from the past 240 hour(s))  SARS CORONAVIRUS 2 (TAT 6-24 HRS) Nasopharyngeal Nasopharyngeal Swab     Status: None   Collection Time: 03/29/19  3:12 PM   Specimen: Nasopharyngeal Swab  Result Value Ref Range Status   SARS Coronavirus 2  NEGATIVE NEGATIVE Final    Comment: (NOTE) SARS-CoV-2 target nucleic acids are NOT DETECTED. The SARS-CoV-2 RNA is generally detectable in upper and lower respiratory specimens during the acute phase of infection. Negative results do not preclude SARS-CoV-2 infection, do not rule out co-infections with other pathogens, and should not be used as the sole basis for treatment or other patient management decisions. Negative results must be combined with clinical observations, patient history, and epidemiological information. The expected result is Negative. Fact Sheet for Patients: SugarRoll.be Fact Sheet for Healthcare Providers: https://www.woods-mathews.com/ This test is not yet approved or cleared by the [place] FDA and  has been authorized for detection and/or diagnosis of SARS-CoV-2 by FDA under an Emergency Use Authorization (EUA). This EUA will remain  in effect (meaning this test can be used) for the duration of the COVID-19 declaration under Section 56 4(b)(1) of the Act, 21 U.S.C. section 360bbb-3(b)(1), unless the authorization is terminated or revoked sooner. Performed at [facility], [place] [address], [place], [place] [address]   Blood culture (routine x 2)     Status: None (Preliminary result)   Collection Time: 03/29/19  3:53 PM   Specimen:  BLOOD RIGHT FOREARM  Result Value Ref Range Status   Specimen Description BLOOD RIGHT FOREARM  Final   Special Requests   Final    BOTTLES DRAWN AEROBIC AND ANAEROBIC Blood Culture adequate volume   Culture NO GROWTH 2 DAYS  Final   Report Status PENDING  Incomplete  Blood culture (routine x 2)     Status: None (Preliminary result)   Collection Time: 03/29/19  3:58 PM   Specimen: BLOOD RIGHT HAND  Result Value Ref Range Status   Specimen Description BLOOD RIGHT HAND  Final   Special Requests   Final    BOTTLES DRAWN AEROBIC AND ANAEROBIC Blood Culture results may not be optimal due to an inadequate volume of blood received in culture bottles   Culture NO GROWTH 2 DAYS  Final   Report Status PENDING  Incomplete    No results for input(s): LIPASE, AMYLASE in the last 168 hours. No results for input(s): AMMONIA in the last 168 hours.  Cardiac Enzymes: No results for input(s): CKTOTAL, CKMB, CKMBINDEX, TROPONINI in the last 168 hours. BNP (last 3 results) Recent Labs    02/23/19 1626 03/29/19 1318  BNP 76.6 62.9    ProBNP (last 3 results) No results for input(s): PROBNP in the last 8760 hours.  Studies:  No results found.   Admission status: The appropriate admission status for this patient is INPATIENT. Inpatient status is judged to be reasonable and necessary in order to provide the required intensity of service to ensure the patient's safety. The patient's presenting symptoms, physical exam findings, and initial radiographic and laboratory data in the context of their chronic comorbidities is felt to place them at high risk for further clinical deterioration. Furthermore, it is not anticipated that the patient will be medically stable for discharge from the hospital within 2 midnights of admission. The following factors support the admission status of inpatient.    The patient's presenting symptoms include hypoxia, dyspnea The worrisome physical exam findings include  hypoxia. The initial radiographic and laboratory data are worrisome because of aspiration pneumonia The chronic co-morbidities include seizure disorder    * I certify that at the point of admission it is my clinical judgment that the patient will require inpatient hospital care spanning beyond 2 midnights from the point of admission due to  high intensity of service, high risk for further deterioration and high frequency of surveillance required.Meredeth Ide   [facility] If 7PM-7AM, please contact night-coverage at [email or web address], Office  [phone]   03/31/2019, 2:42 PM  LOS: 2 days

## 2019-03-31 NOTE — Progress Notes (Signed)
RN received a phone call from [place] with [facility] requesting a stop date on the UNASYN the patient is receiving. RN contacted pharmacist, states there is no stop date currently for this medication. RN called Shanda Bumps with [facility] back and stated there is no stop date for the UNASYN yet.

## 2019-03-31 NOTE — Progress Notes (Signed)
Initial Nutrition Assessment  DOCUMENTATION CODES:   Severe malnutrition in context of chronic illness  INTERVENTION:   -Ensure Enlive po BID, each supplement provides 350 kcal and 20 grams of protein -Magic cup TID with meals, each supplement provides 290 kcal and 9 grams of protein -MVI with minerals daily -Feeding assistance with meals  NUTRITION DIAGNOSIS:   Severe Malnutrition related to chronic illness(dementia) as evidenced by moderate fat depletion, severe fat depletion, moderate muscle depletion, severe muscle depletion.  GOAL:   Patient will meet greater than or equal to 90% of their needs  MONITOR:   PO intake, Supplement acceptance, Diet advancement, Weight trends, Labs, Skin, I & O's  REASON FOR ASSESSMENT:   Low [place]    ASSESSMENT:   Lee [name] is a 69 y.o. male with medical history significant of seizure disorder, stroke, COPD, dementia, alcohol abuse, hypertension brought by EMS to emergency department for possible aspiration pneumonia.  Pt admitted with sepsis in the setting of acute respiratory failure with hypoxemia due to aspiration pneumonia.   3/22- s/p BSE- advanced to dysphagia 1 diet with thin liquids  Reviewed I/O's: +1.6 L x 24 hours and +3 L since admission  UOP: 300 ml x 24 hours  Pt unable to provide history secondary to dementia. Pt familiar to this RD, due to prior admission. RD also reviewed [facility] from [facility].   Spoke with RN and nurse tech, who reports pt is tolerating current diet texture well. Observed nurse tech feeding pt. Documented meal completion 50-100%.   Reviewed wt hx; pt has experienced a 5.9% wt loss over the past year, which is not significant for time frame.   Labs reviewed: Mg: 1.5.  NUTRITION - FOCUSED PHYSICAL EXAM:    Most Recent Value  Orbital Region  Moderate depletion  Upper Arm Region  Severe depletion  Thoracic and Lumbar Region  Moderate depletion  Buccal Region  Severe depletion   Temple Region  Severe depletion  Clavicle Bone Region  Severe depletion  Clavicle and Acromion Bone Region  Severe depletion  Scapular Bone Region  Severe depletion  Dorsal Hand  Moderate depletion  Patellar Region  Severe depletion  Anterior Thigh Region  Severe depletion  Posterior Calf Region  Severe depletion  Edema (RD Assessment)  None  Hair  Reviewed  Eyes  Reviewed  Mouth  Reviewed  Skin  Reviewed  Nails  Reviewed       Diet Order:   Diet Order            DIET - DYS 1 Room service appropriate? No; Fluid consistency: Thin  Diet effective now              EDUCATION NEEDS:   No education needs have been identified at this time  Skin:  Skin Assessment: Skin Integrity Issues: Skin Integrity Issues:: Stage II, DTI DTI: lt heel Stage II: sacrum  Last BM:  Unknown  Height:   Ht Readings from Last 1 Encounters:  03/29/19 5\' 10"  (1.778 m)    Weight:   Wt Readings from Last 1 Encounters:  03/31/19 59.4 kg    Ideal Body Weight:  75.5 kg  BMI:  Body mass index is 18.79 kg/m.  Estimated Nutritional Needs:   Kcal:  2000-2200  Protein:  115-130 grams  Fluid:  > 2 L    04/02/19, RD, LDN, CDCES Registered Dietitian II Certified Diabetes Care and Education Specialist Please refer to [facility] for RD and/or RD on-call/weekend/after hours pager

## 2019-03-31 NOTE — Progress Notes (Signed)
Pharmacy Antibiotic Note  Lee [name] is a 69 y.o. male admitted on 03/29/2019 with aspiration pneumonia.  Pharmacy has been consulted for Unasyn dosing - day #3 of 7. SCr 0.69 stable.  Plan: Unasyn 3g IV q6h - stop date entered for 7 days per MD Monitor clinical progress, renal function, ability to transition to PO antibiotics  Height: 5\' 10"  (177.8 cm) Weight: 130 lb 15.3 oz (59.4 kg) IBW/kg (Calculated) : 73  Temp (24hrs), Avg:98.5 F (36.9 C), Min:97.7 F (36.5 C), Max:99.1 F (37.3 C)  Recent Labs  Lab 03/29/19 1318 03/29/19 1757 03/30/19 0422  WBC 13.5* 12.6* 15.1*  CREATININE 0.73 0.85 0.69  LATICACIDVEN  --  5.3*  --     Estimated Creatinine Clearance: 73.2 mL/min (by C-G formula based on SCr of 0.69 mg/dL).    Allergies  Allergen Reactions  . Orange Juice [Orange Oil] Diarrhea  . Penicillins Nausea And Vomiting    Tolerates Zosyn. "might have been an overdose" (01/22/2012) Has patient had a PCN reaction causing immediate rash, facial/tongue/throat swelling, SOB or lightheadedness with hypotension: yes Has patient had a PCN reaction causing severe rash involving mucus membranes or skin necrosis: unknown Has patient had a PCN reaction that required hospitalization: no Has patient had a PCN reaction occurring within the last 10 years: no If all of the above answers are "NO", then may proceed with Cephalosporin u  . Vicodin [Hydrocodone-Acetaminophen] Other (See Comments)    "triggered seizure both here and at home when he tried to take it" (01/22/2012)  . Oxycodone     Causes Seizures    01/24/2012, PharmD, BCPS Please check AMION for all [facility] contact numbers Clinical Pharmacist 03/31/2019 2:50 PM

## 2019-04-01 LAB — BASIC METABOLIC PANEL
Anion gap: 9 (ref 5–15)
BUN: 6 mg/dL — ABNORMAL LOW (ref 8–23)
CO2: 27 mmol/L (ref 22–32)
Calcium: 8.3 mg/dL — ABNORMAL LOW (ref 8.9–10.3)
Chloride: 107 mmol/L (ref 98–111)
Creatinine, Ser: 0.55 mg/dL — ABNORMAL LOW (ref 0.61–1.24)
GFR calc Af Amer: >60 mL/min (ref >60)
GFR calc non Af Amer: >60 mL/min (ref >60)
Glucose, Bld: 94 mg/dL (ref 70–99)
Potassium: 3.5 mmol/L (ref 3.5–5.1)
Sodium: 143 mmol/L (ref 135–145)

## 2019-04-01 LAB — CBC
HCT: 38.2 % — ABNORMAL LOW (ref 39.0–52.0)
Hemoglobin: 12.6 g/dL — ABNORMAL LOW (ref 13.0–17.0)
MCH: 33.2 pg (ref 26.0–34.0)
MCHC: 33 g/dL (ref 30.0–36.0)
MCV: 100.5 fL — ABNORMAL HIGH (ref 80.0–100.0)
Platelets: 206 10*3/uL (ref 150–400)
RBC: 3.8 MIL/uL — ABNORMAL LOW (ref 4.22–5.81)
RDW: 15.4 % (ref 11.5–15.5)
WBC: 11.6 10*3/uL — ABNORMAL HIGH (ref 4.0–10.5)
nRBC: 0 % (ref 0.0–0.2)

## 2019-04-01 MED ORDER — IPRATROPIUM-ALBUTEROL 0.5-2.5 (3) MG/3ML IN SOLN: 3.0000 mL | Freq: Four times a day (QID) | RESPIRATORY_TRACT | Status: DC

## 2019-04-01 MED ORDER — ACETAMINOPHEN 325 MG PO TABS
650.0000 mg | ORAL_TABLET | Freq: Four times a day (QID) | ORAL | Status: DC | PRN
  Administered 2019-04-02: 650 mg via ORAL
  Filled 2019-04-01: qty 2

## 2019-04-01 MED ORDER — FUROSEMIDE 10 MG/ML IJ SOLN
40.0000 mg | Freq: Once | INTRAMUSCULAR | Status: AC
  Administered 2019-04-01: 40 mg via INTRAVENOUS
  Filled 2019-04-01: qty 4

## 2019-04-01 MED ORDER — FUROSEMIDE 10 MG/ML IJ SOLN
20.0000 mg | Freq: Once | INTRAMUSCULAR | Status: AC
  Administered 2019-04-01: 20 mg via INTRAVENOUS
  Filled 2019-04-01: qty 2

## 2019-04-01 MED ORDER — LEVETIRACETAM 100 MG/ML PO SOLN
1500.0000 mg | Freq: Two times a day (BID) | ORAL | Status: DC
  Administered 2019-04-01 – 2019-04-02 (×3): 1500 mg via ORAL
  Filled 2019-04-01 (×4): qty 15

## 2019-04-01 MED ORDER — MAGNESIUM SULFATE 2 GM/50ML IV SOLN
2.0000 g | Freq: Once | INTRAVENOUS | Status: AC
  Administered 2019-04-01: 12:00:00 2 g via INTRAVENOUS
  Filled 2019-04-01: qty 50

## 2019-04-01 MED ORDER — POTASSIUM CHLORIDE 10 MEQ/100ML IV SOLN
10.0000 meq | INTRAVENOUS | Status: AC
  Administered 2019-04-01 (×4): 10 meq via INTRAVENOUS
  Filled 2019-04-01 (×4): qty 100

## 2019-04-01 MED ORDER — SODIUM CHLORIDE 0.9 % IV SOLN: INTRAVENOUS | Status: DC

## 2019-04-01 NOTE — Plan of Care (Signed)
  Problem: Education: Goal: Knowledge of General Education information will improve Description: Including pain rating scale, medication(s)/side effects and non-pharmacologic comfort measures 04/01/2019 0641 by Smith-Fischer, Lawernce Pitts, RN Outcome: Not Progressing 04/01/2019 0641 by Smith-Fischer, Lawernce Pitts, RN Outcome: Not Progressing   Problem: Health Behavior/Discharge Planning: Goal: Ability to manage health-related needs will improve 04/01/2019 0641 by Smith-Fischer, Lawernce Pitts, RN Outcome: Not Progressing 04/01/2019 0641 by Smith-Fischer, Lawernce Pitts, RN Outcome: Not Progressing   Problem: Clinical Measurements: Goal: Ability to maintain clinical measurements within normal limits will improve 04/01/2019 0641 by Smith-Fischer, Lawernce Pitts, RN Outcome: Not Progressing 04/01/2019 0641 by Smith-Fischer, Lawernce Pitts, RN Outcome: Not Progressing Goal: Will remain free from infection 04/01/2019 0641 by Smith-Fischer, Lawernce Pitts, RN Outcome: Not Progressing 04/01/2019 0641 by Smith-Fischer, Lawernce Pitts, RN Outcome: Not Progressing Goal: Diagnostic test results will improve 04/01/2019 0641 by Smith-Fischer, Lawernce Pitts, RN Outcome: Not Progressing 04/01/2019 0641 by Smith-Fischer, Lawernce Pitts, RN Outcome: Not Progressing Goal: Respiratory complications will improve 04/01/2019 0641 by Smith-Fischer, Lawernce Pitts, RN Outcome: Not Progressing 04/01/2019 0641 by Smith-Fischer, Lawernce Pitts, RN Outcome: Not Progressing Goal: Cardiovascular complication will be avoided 04/01/2019 0641 by Smith-Fischer, Lawernce Pitts, RN Outcome: Not Progressing 04/01/2019 0641 by Smith-Fischer, Lawernce Pitts, RN Outcome: Not Progressing   Problem: Activity: Goal: Risk for activity intolerance will decrease 04/01/2019 0641 by Smith-Fischer, Lawernce Pitts, RN Outcome: Not Progressing 04/01/2019 0641 by Smith-Fischer, Lawernce Pitts, RN Outcome: Not Progressing   Problem: Nutrition: Goal: Adequate nutrition will be maintained 04/01/2019 0641 by Smith-Fischer,  Lawernce Pitts, RN Outcome: Not Progressing 04/01/2019 0641 by Smith-Fischer, Lawernce Pitts, RN Outcome: Not Progressing   Problem: Coping: Goal: Level of anxiety will decrease 04/01/2019 0641 by Smith-Fischer, Lawernce Pitts, RN Outcome: Not Progressing 04/01/2019 0641 by Smith-Fischer, Lawernce Pitts, RN Outcome: Not Progressing   Problem: Elimination: Goal: Will not experience complications related to bowel motility 04/01/2019 0641 by Smith-Fischer, Lawernce Pitts, RN Outcome: Not Progressing 04/01/2019 0641 by Smith-Fischer, Lawernce Pitts, RN Outcome: Not Progressing Goal: Will not experience complications related to urinary retention 04/01/2019 0641 by Smith-Fischer, Lawernce Pitts, RN Outcome: Not Progressing 04/01/2019 0641 by Smith-Fischer, Lawernce Pitts, RN Outcome: Not Progressing   Problem: Pain Managment: Goal: General experience of comfort will improve 04/01/2019 0641 by Smith-Fischer, Lawernce Pitts, RN Outcome: Not Progressing 04/01/2019 0641 by Smith-Fischer, Lawernce Pitts, RN Outcome: Not Progressing   Problem: Safety: Goal: Ability to remain free from injury will improve 04/01/2019 0641 by Smith-Fischer, Lawernce Pitts, RN Outcome: Not Progressing 04/01/2019 0641 by Smith-Fischer, Lawernce Pitts, RN Outcome: Not Progressing   Problem: Skin Integrity: Goal: Risk for impaired skin integrity will decrease 04/01/2019 0641 by Smith-Fischer, Lawernce Pitts, RN Outcome: Not Progressing 04/01/2019 0641 by Smith-Fischer, Lawernce Pitts, RN Outcome: Not Progressing

## 2019-04-01 NOTE — Progress Notes (Signed)
Patient has a dime size open wound to middle part of sacrum.

## 2019-04-01 NOTE — TOC Progression Note (Signed)
Transition of Care [facility]) - Progression Note    Patient Details  Name: TOSHIO SLUSHER MRN: [id number] Date of Birth: 1950-08-17  [facility] CM/SW Contact  Gildardo Griffes, [place] Phone Number: 04/01/2019, 9:55 AM  Clinical Narrative:     CSW spoke with Everardo Pacific at [facility] who reports patient is long term resident under his Medicaid and would not need insurance through his [facility] to return.   Patient can return to [facility] when medically stable. Please advise CSW when this occurs to assist in facilitating timely discharge thank you.   Expected Discharge Plan: Skilled Nursing Facility Barriers to Discharge: Continued Medical Work up  Expected Discharge Plan and Services Expected Discharge Plan: Skilled Nursing Facility     Post Acute Care Choice: Skilled Nursing Facility Living arrangements for the past 2 months: Skilled Nursing Facility([facility])                                       Social Determinants of Health (SDOH) Interventions    Readmission Risk Interventions No flowsheet data found.

## 2019-04-01 NOTE — Consult Note (Signed)
[facility] CM Inpatient Consult   04/01/2019  Lee [name] [birth year] [id number]   Patient screened for high risk score for unplanned readmission score of 26% and for less than 30 days readmission hospitalization patient in the [facility].   Chart was briefly checked  if potential [facility] Care Management services were needed.   Review of patient's medical record reveals patient is a long term resident of [facility] under Medicaid noted and with review of inpatient Transition of Care LCSW notes.  Plan:  No current follow up needs are assessed for post hospital needs for [facility] Medicare. Patient is a resident at a skilled nursing facility.   For questions contact:   Charlesetta Shanks, RN BSN CCM [facility]  [phone] business mobile phone Toll free office [phone]  Fax number: [phone] [email or web address]

## 2019-04-01 NOTE — Progress Notes (Signed)
PROGRESS NOTE    Lee [name]  LKG:[id number] DOB: July 10, 1950 DOA: 03/29/2019 PCP: Iona Beard, MD   Brief Narrative:  69 year old with history of seizure disorder, CVA, COPD, dementia, alcohol use, HTN brought to the hospital for shortness of breath and vomiting.  Admitted for concerns of aspiration pneumonia, started on IV Unasyn.  COVID-19 negative.  Speech recommended dysphagia 1 diet.   Assessment & Plan:   Principal Problem:   Acute respiratory failure with hypoxemia (HCC) Active Problems:   Peripheral vascular disease (HCC)   Altered mental status   Stroke ([place])   Seizures (HCC)   Essential hypertension   Aspiration pneumonia (HCC)   Dementia (HCC)   Acute respiratory failure with hypoxia (HCC)   Sepsis (HCC)   Macrocytosis  Acute hypoxic respiratory failure secondary to aspiration pneumonia, multi lobar -Continue IV Unasyn, plan for total of 7 days.  Once hypoxia is improved he can be transitioned to oral Augmentin to complete the course as well. Last day 3/29. COVID-19 test negative, speech and swallow recommending dysphagia 1 diet. -Chest x-ray shows multilobar bibasilar pneumonia -Replete potassium. -Due to some wheezing, stop his IV fluids.  Give Lasix 40 mg IV once.  Bronchodilators.  Seizure disorder -Continue home Keppra, valproic acid, Vimpat and carbamazepine  Essential hypertension -Continue home amlodipine and hydralazine.  History of COPD -Bronchodilators as needed  History of dementia without behavioral disturbances -Supportive care.  Delirium protocol.  Pressure Injury 03/01/19 Heel Left Deep Tissue Pressure Injury - Purple or maroon localized area of discolored intact skin or blood-filled blister due to damage of underlying soft tissue from pressure and/or shear. (Active)  03/01/19 0800  Location: Heel  Location Orientation: Left  Staging: Deep Tissue Pressure Injury - Purple or maroon localized area of discolored intact skin or  blood-filled blister due to damage of underlying soft tissue from pressure and/or shear.  Wound Description (Comments):   Present on Admission: No     Pressure Injury 03/04/19 Sacrum Mid Stage 2 -  Partial thickness loss of dermis presenting as a shallow open injury with a red, pink wound bed without slough. (Active)  03/04/19 1300  Location: Sacrum  Location Orientation: Mid  Staging: Stage 2 -  Partial thickness loss of dermis presenting as a shallow open injury with a red, pink wound bed without slough.  Wound Description (Comments):   Present on Admission: No     Pressure Injury 03/31/19 Heel Right Deep Tissue Pressure Injury - Purple or maroon localized area of discolored intact skin or blood-filled blister due to damage of underlying soft tissue from pressure and/or shear. black (Active)  03/31/19 0902  Location: Heel  Location Orientation: Right  Staging: Deep Tissue Pressure Injury - Purple or maroon localized area of discolored intact skin or blood-filled blister due to damage of underlying soft tissue from pressure and/or shear.  Wound Description (Comments): black  Present on Admission: Yes (old dressing from facility)      DVT prophylaxis: Lovenox Code Status: Full code Family Communication:   Disposition Plan:   Patient From=   Patient Anticipated D/C place=   Barriers= maintain hospital stay due to abnormal breath sounds requiring IV diuretics and aggressive bronchodilators.  I worry about his respiratory status a little today.  He is at risk for decompensation.   Subjective: Patient seen and examined at bedside, does not participate much in conversation.  He had slightly labored breathing during my evaluation and he was running on IV fluids.  Review of Systems Otherwise  negative except as per HPI, including: General: Denies fever, chills, night sweats or unintended weight loss. Resp: Denies cough, wheezing, shortness of breath. Cardiac: Denies chest pain,  palpitations, orthopnea, paroxysmal nocturnal dyspnea. GI: Denies abdominal pain, nausea, vomiting, diarrhea or constipation GU: Denies dysuria, frequency, hesitancy or incontinence MS: Denies muscle aches, joint pain or swelling Neuro: Denies headache, neurologic deficits (focal weakness, numbness, tingling), abnormal gait Psych: Denies anxiety, depression, SI/HI/AVH Skin: Denies new rashes or lesions ID: Denies sick contacts, exotic exposures, travel  Examination:  General exam: Appears calm and comfortable, 4 L nasal cannula Respiratory system: Bilateral expiratory wheezing with some tachypnea Cardiovascular system: S1 & S2 heard, RRR. No JVD, murmurs, rubs, gallops or clicks. No pedal edema. Gastrointestinal system: Abdomen is nondistended, soft and nontender. No organomegaly or masses felt. Normal bowel sounds heard. Central nervous system: Unable to fully assess but grossly moving all extremities. Extremities: Symmetric 4 x 5 power. Skin: No rashes, lesions or ulcers Psychiatry: Unable to fully assess   Objective: Vitals:   04/01/19 0137 04/01/19 0500 04/01/19 0516 04/01/19 0753  BP: (!) 142/82  (!) 145/77 (!) 144/72  Pulse: 77  68 72  Resp: 19  20 18   Temp: 98.4 F (36.9 C)  98.4 F (36.9 C) 98.6 F (37 C)  TempSrc: Oral  Oral Oral  SpO2:   100% 100%  Weight:  60.4 kg    Height:        Intake/Output Summary (Last 24 hours) at 04/01/2019 0755 Last data filed at 04/01/2019 0149 Gross per 24 hour  Intake 1519.55 ml  Output 800 ml  Net 719.55 ml   Filed Weights   03/30/19 0028 03/31/19 0043 04/01/19 0500  Weight: 56 kg 59.4 kg 60.4 kg     Data Reviewed:   CBC: Recent Labs  Lab 03/29/19 1318 03/29/19 1518 03/29/19 1757 03/30/19 0422 04/01/19 0446  WBC 13.5*  --  12.6* 15.1* 11.6*  NEUTROABS 12.1*  --   --   --   --   HGB 15.8 14.6 13.8 13.1 12.6*  HCT 48.7 43.0 42.5 39.8 38.2*  MCV 102.5*  --  101.9* 100.0 100.5*  PLT 286  --  232 203 206   Basic  Metabolic Panel: Recent Labs  Lab 03/29/19 1318 03/29/19 1518 03/29/19 1757 03/30/19 0422 04/01/19 0446  NA 143 141  --  143 143  K 4.5 3.8  --  4.4 3.5  CL 101  --   --  106 107  CO2 25  --   --  25 27  GLUCOSE 111*  --   --  88 94  BUN 14  --   --  14 6*  CREATININE 0.73  --  0.85 0.69 0.55*  CALCIUM 9.3  --   --  8.6* 8.3*  MG  --   --  1.5*  --   --    GFR: Estimated Creatinine Clearance: 74.5 mL/min (A) (by C-G formula based on SCr of 0.55 mg/dL (L)). Liver Function Tests: Recent Labs  Lab 03/29/19 1318  AST 38  ALT 32  ALKPHOS 135*  BILITOT 0.5  PROT 7.8  ALBUMIN 3.3*   No results for input(s): LIPASE, AMYLASE in the last 168 hours. No results for input(s): AMMONIA in the last 168 hours. Coagulation Profile: No results for input(s): INR, PROTIME in the last 168 hours. Cardiac Enzymes: No results for input(s): CKTOTAL, CKMB, CKMBINDEX, TROPONINI in the last 168 hours. BNP (last 3 results) No results for input(s): PROBNP  in the last 8760 hours. HbA1C: No results for input(s): HGBA1C in the last 72 hours. CBG: No results for input(s): GLUCAP in the last 168 hours. Lipid Profile: No results for input(s): CHOL, HDL, LDLCALC, TRIG, CHOLHDL, LDLDIRECT in the last 72 hours. Thyroid Function Tests: No results for input(s): TSH, T4TOTAL, FREET4, T3FREE, THYROIDAB in the last 72 hours. Anemia Panel: No results for input(s): VITAMINB12, FOLATE, FERRITIN, TIBC, IRON, RETICCTPCT in the last 72 hours. Sepsis Labs: Recent Labs  Lab 03/29/19 1757  LATICACIDVEN 5.3*    Recent Results (from the past 240 hour(s))  SARS CORONAVIRUS 2 (TAT 6-24 HRS) Nasopharyngeal Nasopharyngeal Swab     Status: None   Collection Time: 03/29/19  3:12 PM   Specimen: Nasopharyngeal Swab  Result Value Ref Range Status   SARS Coronavirus 2 NEGATIVE NEGATIVE Final    Comment: (NOTE) SARS-CoV-2 target nucleic acids are NOT DETECTED. The SARS-CoV-2 RNA is generally detectable in upper and  lower respiratory specimens during the acute phase of infection. Negative results do not preclude SARS-CoV-2 infection, do not rule out co-infections with other pathogens, and should not be used as the sole basis for treatment or other patient management decisions. Negative results must be combined with clinical observations, patient history, and epidemiological information. The expected result is Negative. Fact Sheet for Patients: HairSlick.no Fact Sheet for Healthcare Providers: quierodirigir.com This test is not yet approved or cleared by the [place] FDA and  has been authorized for detection and/or diagnosis of SARS-CoV-2 by FDA under an Emergency Use Authorization (EUA). This EUA will remain  in effect (meaning this test can be used) for the duration of the COVID-19 declaration under Section 56 4(b)(1) of the Act, 21 U.S.C. section 360bbb-3(b)(1), unless the authorization is terminated or revoked sooner. Performed at [facility], [address] [place], [place] [address]   Blood culture (routine x 2)     Status: None (Preliminary result)   Collection Time: 03/29/19  3:53 PM   Specimen: BLOOD RIGHT FOREARM  Result Value Ref Range Status   Specimen Description BLOOD RIGHT FOREARM  Final   Special Requests   Final    BOTTLES DRAWN AEROBIC AND ANAEROBIC Blood Culture adequate volume   Culture   Final    NO GROWTH 3 DAYS Performed at [facility], [address], [place], [place] [address]    Report Status PENDING  Incomplete  Blood culture (routine x 2)     Status: None (Preliminary result)   Collection Time: 03/29/19  3:58 PM   Specimen: BLOOD RIGHT HAND  Result Value Ref Range Status   Specimen Description BLOOD RIGHT HAND  Final   Special Requests   Final    BOTTLES DRAWN AEROBIC AND ANAEROBIC Blood Culture results may not be optimal due to an inadequate volume of blood received in culture bottles    Culture   Final    NO GROWTH 3 DAYS Performed at [facility], [address], [place], [place] [address]    Report Status PENDING  Incomplete         Radiology Studies: No results found.      Scheduled Meds: . amLODipine  10 mg Oral Daily  . carBAMazepine  300 mg Oral BID  . clonazepam  0.25 mg Oral BID  . enoxaparin (LOVENOX) injection  40 mg Subcutaneous Q24H  . feeding supplement (ENSURE ENLIVE)  237 mL Oral BID BM  . hydrALAZINE  25 mg Oral Q8H  .  lacosamide  300 mg Oral BID  . levETIRAcetam  1,500 mg Oral BID  . multivitamin with minerals  1 tablet Oral Daily  . valproic acid  750 mg Oral TID   Continuous Infusions: . sodium chloride 100 mL/hr at 03/31/19 2240  . ampicillin-sulbactam (UNASYN) IV 3 g (04/01/19 0519)     LOS: 3 days   Time spent= 35 mins     Joline Maxcy, MD [facility]  If 7PM-7AM, please contact night-coverage  04/01/2019, 7:55 AM

## 2019-04-02 DIAGNOSIS — F0390 Unspecified dementia without behavioral disturbance: Secondary | ICD-10-CM

## 2019-04-02 LAB — BASIC METABOLIC PANEL
Anion gap: 8 (ref 5–15)
BUN: 7 mg/dL — ABNORMAL LOW (ref 8–23)
CO2: 33 mmol/L — ABNORMAL HIGH (ref 22–32)
Calcium: 8.6 mg/dL — ABNORMAL LOW (ref 8.9–10.3)
Chloride: 104 mmol/L (ref 98–111)
Creatinine, Ser: 0.59 mg/dL — ABNORMAL LOW (ref 0.61–1.24)
GFR calc Af Amer: >60 mL/min (ref >60)
GFR calc non Af Amer: >60 mL/min (ref >60)
Glucose, Bld: 97 mg/dL (ref 70–99)
Potassium: 3.2 mmol/L — ABNORMAL LOW (ref 3.5–5.1)
Sodium: 145 mmol/L (ref 135–145)

## 2019-04-02 LAB — MAGNESIUM: Magnesium: 1.8 mg/dL (ref 1.7–2.4)

## 2019-04-02 MED ORDER — VALPROATE SODIUM 500 MG/5ML IV SOLN
750.0000 mg | Freq: Three times a day (TID) | INTRAVENOUS | Status: DC
  Administered 2019-04-03 (×3): 750 mg via INTRAVENOUS
  Filled 2019-04-02 (×6): qty 7.5

## 2019-04-02 MED ORDER — DICLOFENAC SODIUM 1 % EX GEL
2.0000 g | Freq: Four times a day (QID) | CUTANEOUS | Status: DC
  Administered 2019-04-02 – 2019-04-03 (×4): 2 g via TOPICAL
  Filled 2019-04-02: qty 100

## 2019-04-02 MED ORDER — SODIUM CHLORIDE 0.9 % IV SOLN
300.0000 mg | Freq: Two times a day (BID) | INTRAVENOUS | Status: DC
  Administered 2019-04-03 (×2): 300 mg via INTRAVENOUS
  Filled 2019-04-02 (×3): qty 30

## 2019-04-02 MED ORDER — POTASSIUM CHLORIDE 10 MEQ/100ML IV SOLN
10.0000 meq | INTRAVENOUS | Status: AC
  Administered 2019-04-02 – 2019-04-03 (×3): 10 meq via INTRAVENOUS
  Filled 2019-04-02 (×3): qty 100

## 2019-04-02 MED ORDER — LEVETIRACETAM IN NACL 1500 MG/100ML IV SOLN
1500.0000 mg | Freq: Two times a day (BID) | INTRAVENOUS | Status: DC
  Administered 2019-04-03 (×2): 1500 mg via INTRAVENOUS
  Filled 2019-04-02 (×4): qty 100

## 2019-04-02 MED ORDER — ACETAMINOPHEN 650 MG RE SUPP: 650.0000 mg | Freq: Three times a day (TID) | RECTAL | Status: DC | PRN

## 2019-04-02 MED ORDER — POTASSIUM CHLORIDE CRYS ER 20 MEQ PO TBCR
40.0000 meq | EXTENDED_RELEASE_TABLET | Freq: Two times a day (BID) | ORAL | Status: DC
  Filled 2019-04-02: qty 2

## 2019-04-02 NOTE — Progress Notes (Signed)
Nutrition Follow-up  DOCUMENTATION CODES:   Severe malnutrition in context of chronic illness  INTERVENTION:   -Continue Ensure Enlive po BID, each supplement provides 350 kcal and 20 grams of protein -Continue Magic cup TID with meals, each supplement provides 290 kcal and 9 grams of protein -Continue MVI with minerals daily -Continue feeding assistance with meals  NUTRITION DIAGNOSIS:   Severe Malnutrition related to chronic illness(dementia) as evidenced by moderate fat depletion, severe fat depletion, moderate muscle depletion, severe muscle depletion.  Ongoing  GOAL:   Patient will meet greater than or equal to 90% of their needs  Progressing   MONITOR:   PO intake, Supplement acceptance, Diet advancement, Weight trends, Labs, Skin, I & O's  REASON FOR ASSESSMENT:   Low Braden    ASSESSMENT:   Lee [name] is a 69 y.o. male with medical history significant of seizure disorder, stroke, COPD, dementia, alcohol abuse, hypertension brought by EMS to emergency department for possible aspiration pneumonia.  3/22- s/p BSE- advanced to dysphagia 1 diet with thin liquids  Reviewed I/O's: -2.2 L x 24 hours and +1.6 L since admission  UOP: 3 L x 24 hours  Pt resting quietly at time of visit. He did not respond to his name being called.   Observed breakfast tray- pt consumed all of tray except magic cup, grits, and juice. Noted meal completion documented at 75-100%. Pt consuming Ensure supplements per MAR.   Labs reviewed: K: 3.2.   Diet Order:   Diet Order            DIET - DYS 1 Room service appropriate? No; Fluid consistency: Thin  Diet effective now              EDUCATION NEEDS:   No education needs have been identified at this time  Skin:  Skin Assessment: Skin Integrity Issues: Skin Integrity Issues:: Stage II, DTI DTI: lt heel Stage II: sacrum  Last BM:  Unknown  Height:   Ht Readings from Last 1 Encounters:  03/29/19 5\' 10"  (1.778 m)     Weight:   Wt Readings from Last 1 Encounters:  04/02/19 58.2 kg    Ideal Body Weight:  75.5 kg  BMI:  Body mass index is 18.41 kg/m.  Estimated Nutritional Needs:   Kcal:  2000-2200  Protein:  115-130 grams  Fluid:  > 2 L    04/04/19, RD, LDN, CDCES Registered Dietitian II Certified Diabetes Care and Education Specialist Please refer to [facility] for RD and/or RD on-call/weekend/after hours pager

## 2019-04-02 NOTE — Plan of Care (Signed)

## 2019-04-02 NOTE — Progress Notes (Signed)
[name]  PROGRESS NOTE    Lee [name]  WGN:[id number] DOB: [birth year] DOA: 03/29/2019 PCP: Mirna Mires, MD   Brief Narrative:   69 year old with history of seizure disorder, CVA, COPD, dementia, alcohol use, HTN brought to the hospital for shortness of breath and vomiting.  Admitted for concerns of aspiration pneumonia, started on IV Unasyn.  COVID-19 negative.  Speech recommended dysphagia 1 diet.  3/25: No acute event ON.    Assessment & Plan:   Principal Problem:   Acute respiratory failure with hypoxemia (HCC) Active Problems:   Peripheral vascular disease (HCC)   Altered mental status   Stroke (HCC)   Seizures (HCC)   Essential hypertension   Aspiration pneumonia (HCC)   Dementia (HCC)   Acute respiratory failure with hypoxia (HCC)   Sepsis (HCC)   Macrocytosis  Acute hypoxic respiratory failure secondary to aspiration pneumonia, multi lobar     - Continue IV Unasyn, plan for total of 7 days.       - Once hypoxia is improved he can be transitioned to oral Augmentin to complete the course as well. Last day 3/29.      - COVID-19 test negative, speech and swallow recommending dysphagia 1 diet.     - Chest x-ray shows multilobar bibasilar pneumonia     - Bronchodilators.     - on RA but WOB seems increased; continue abx, hold lasix     - reassess for possible AM discharge  Seizure disorder     - Keppra, valproic acid, Vimpat and carbamazepine  Essential hypertension     - amlodipine and hydralazine; may need hydralazine increase  History of COPD     - duonebs PRN  History of dementia without behavioral disturbances     - Supportive care.  Delirium protocol.  Pressure Injury 03/01/19 Heel Left Deep Tissue Pressure Injury - Purple or maroon localized area of discolored intact skin or blood-filled blister due to damage of underlying soft tissue from pressure and/or shear. (Active) 03/01/19 0800 Location: Heel Location Orientation: Left Staging: Deep Tissue  Pressure Injury - Purple or maroon localized area of discolored intact skin or blood-filled blister due to damage of underlying soft tissue from pressure and/or shear. Wound Description (Comments):  Present on Admission: No   Pressure Injury 03/04/19 Sacrum Mid Stage 2 -  Partial thickness loss of dermis presenting as a shallow open injury with a red, pink wound bed without slough. (Active) 03/04/19 1300 Location: Sacrum Location Orientation: Mid Staging: Stage 2 -  Partial thickness loss of dermis presenting as a shallow open injury with a red, pink wound bed without slough. Wound Description (Comments):  Present on Admission: No   Pressure Injury 03/31/19 Heel Right Deep Tissue Pressure Injury - Purple or maroon localized area of discolored intact skin or blood-filled blister due to damage of underlying soft tissue from pressure and/or shear. black (Active) 03/31/19 0902 Location: Heel Location Orientation: Right Staging: Deep Tissue Pressure Injury - Purple or maroon localized area of discolored intact skin or blood-filled blister due to damage of underlying soft tissue from pressure and/or shear. Wound Description (Comments): black Present on Admission: Yes (old dressing from facility)  DVT prophylaxis: lovenox Code Status: FULL   Disposition Plan: Remains inpt for increased WOB, possible d/c to facility tomorrow.  Antimicrobials:  . Unasyn   Subjective: No acute events ON.   Objective: Vitals:   04/01/19 2128 04/02/19 0512 04/02/19 0626 04/02/19 1224  BP: (!) 157/74 (!) 175/84 (!) 144/78 (!) 157/90  Pulse: 70 72 68 79  Resp: 19 19  18   Temp: 98.2 F (36.8 C) (!) 97.4 F (36.3 C)  98.1 F (36.7 C)  TempSrc: Oral Oral  Oral  SpO2: 100% 99%  100%  Weight:  58.2 kg    Height:        Intake/Output Summary (Last 24 hours) at 04/02/2019 1801 Last data filed at 04/02/2019 1300 Gross per 24 hour  Intake 480 ml  Output 1700 ml  Net -1220 ml   Filed Weights   03/31/19  0043 04/01/19 0500 04/02/19 0512  Weight: 59.4 kg 60.4 kg 58.2 kg    Examination:  General: 69 y.o. male resting in bed in NAD Cardiovascular: RRR, +S1, S2, no m/g/r Respiratory: CTABL, no w/r/r, normal WOB GI: BS+, NDNT, soft MSK: No e/c/c Neuro: sleeping   Data Reviewed: I have personally reviewed following labs and imaging studies.  CBC: Recent Labs  Lab 03/29/19 1318 03/29/19 1518 03/29/19 1757 03/30/19 0422 04/01/19 0446  WBC 13.5*  --  12.6* 15.1* 11.6*  NEUTROABS 12.1*  --   --   --   --   HGB 15.8 14.6 13.8 13.1 12.6*  HCT 48.7 43.0 42.5 39.8 38.2*  MCV 102.5*  --  101.9* 100.0 100.5*  PLT 286  --  232 203 206   Basic Metabolic Panel: Recent Labs  Lab 03/29/19 1318 03/29/19 1518 03/29/19 1757 03/30/19 0422 04/01/19 0446 04/02/19 0549  NA 143 141  --  143 143 145  K 4.5 3.8  --  4.4 3.5 3.2*  CL 101  --   --  106 107 104  CO2 25  --   --  25 27 33*  GLUCOSE 111*  --   --  88 94 97  BUN 14  --   --  14 6* 7*  CREATININE 0.73  --  0.85 0.69 0.55* 0.59*  CALCIUM 9.3  --   --  8.6* 8.3* 8.6*  MG  --   --  1.5*  --   --  1.8   GFR: Estimated Creatinine Clearance: 71.7 mL/min (A) (by C-G formula based on SCr of 0.59 mg/dL (L)). Liver Function Tests: Recent Labs  Lab 03/29/19 1318  AST 38  ALT 32  ALKPHOS 135*  BILITOT 0.5  PROT 7.8  ALBUMIN 3.3*   No results for input(s): LIPASE, AMYLASE in the last 168 hours. No results for input(s): AMMONIA in the last 168 hours. Coagulation Profile: No results for input(s): INR, PROTIME in the last 168 hours. Cardiac Enzymes: No results for input(s): CKTOTAL, CKMB, CKMBINDEX, TROPONINI in the last 168 hours. BNP (last 3 results) No results for input(s): PROBNP in the last 8760 hours. HbA1C: No results for input(s): HGBA1C in the last 72 hours. CBG: No results for input(s): GLUCAP in the last 168 hours. Lipid Profile: No results for input(s): CHOL, HDL, LDLCALC, TRIG, CHOLHDL, LDLDIRECT in the last 72  hours. Thyroid Function Tests: No results for input(s): TSH, T4TOTAL, FREET4, T3FREE, THYROIDAB in the last 72 hours. Anemia Panel: No results for input(s): VITAMINB12, FOLATE, FERRITIN, TIBC, IRON, RETICCTPCT in the last 72 hours. Sepsis Labs: Recent Labs  Lab 03/29/19 1757  LATICACIDVEN 5.3*    Recent Results (from the past 240 hour(s))  SARS CORONAVIRUS 2 (TAT 6-24 HRS) Nasopharyngeal Nasopharyngeal Swab     Status: None   Collection Time: 03/29/19  3:12 PM   Specimen: Nasopharyngeal Swab  Result Value Ref Range Status   SARS Coronavirus 2 NEGATIVE  NEGATIVE Final    Comment: (NOTE) SARS-CoV-2 target nucleic acids are NOT DETECTED. The SARS-CoV-2 RNA is generally detectable in upper and lower respiratory specimens during the acute phase of infection. Negative results do not preclude SARS-CoV-2 infection, do not rule out co-infections with other pathogens, and should not be used as the sole basis for treatment or other patient management decisions. Negative results must be combined with clinical observations, patient history, and epidemiological information. The expected result is Negative. Fact Sheet for Patients: SugarRoll.be Fact Sheet for Healthcare Providers: https://www.woods-mathews.com/ This test is not yet approved or cleared by the [place] FDA and  has been authorized for detection and/or diagnosis of SARS-CoV-2 by FDA under an Emergency Use Authorization (EUA). This EUA will remain  in effect (meaning this test can be used) for the duration of the COVID-19 declaration under Section 56 4(b)(1) of the Act, 21 U.S.C. section 360bbb-3(b)(1), unless the authorization is terminated or revoked sooner. Performed at [facility], [place] [address] [place], [place] [address]   Blood culture (routine x 2)     Status: None (Preliminary result)   Collection Time: 03/29/19  3:53 PM   Specimen: BLOOD RIGHT FOREARM  Result  Value Ref Range Status   Specimen Description BLOOD RIGHT FOREARM  Final   Special Requests   Final    BOTTLES DRAWN AEROBIC AND ANAEROBIC Blood Culture adequate volume   Culture   Final    NO GROWTH 4 DAYS Performed at [facility], [place] [address], [place], [place] [address]    Report Status PENDING  Incomplete  Blood culture (routine x 2)     Status: None (Preliminary result)   Collection Time: 03/29/19  3:58 PM   Specimen: BLOOD RIGHT HAND  Result Value Ref Range Status   Specimen Description BLOOD RIGHT HAND  Final   Special Requests   Final    BOTTLES DRAWN AEROBIC AND ANAEROBIC Blood Culture results may not be optimal due to an inadequate volume of blood received in culture bottles   Culture   Final    NO GROWTH 4 DAYS Performed at [facility], [place] [address], [place], [place] [address]    Report Status PENDING  Incomplete      Radiology Studies: No results found.   Scheduled Meds: . amLODipine  10 mg Oral Daily  . carBAMazepine  300 mg Oral BID  . clonazepam  0.25 mg Oral BID  . diclofenac Sodium  2 g Topical QID  . enoxaparin (LOVENOX) injection  40 mg Subcutaneous Q24H  . feeding supplement (ENSURE ENLIVE)  237 mL Oral BID BM  . hydrALAZINE  25 mg Oral Q8H  . lacosamide  300 mg Oral BID  . levETIRAcetam  1,500 mg Oral BID  . multivitamin with minerals  1 tablet Oral Daily  . potassium chloride  40 mEq Oral BID  . valproic acid  750 mg Oral TID   Continuous Infusions: . ampicillin-sulbactam (UNASYN) IV 3 g (04/02/19 1630)     LOS: 4 days    Time spent: 25 minutes spent in the coordination of care.    Jonnie Finner, DO [facility]  If 7PM-7AM, please contact night-coverage [email or web address] 04/02/2019, 6:01 PM

## 2019-04-02 NOTE — Plan of Care (Signed)
  Problem: Education: Goal: Knowledge of General Education information will improve Description Including pain rating scale, medication(s)/side effects and non-pharmacologic comfort measures Outcome: Progressing   Problem: Health Behavior/Discharge Planning: Goal: Ability to manage health-related needs will improve Outcome: Progressing   

## 2019-04-02 NOTE — Progress Notes (Signed)
Patient not responding to orientation questions. Will not take water for 47mL swallow test. PO meds scheduled for this shift. Provider notified to determine best course of action. Will update pending provider response.

## 2019-04-03 DIAGNOSIS — R404 Transient alteration of awareness: Secondary | ICD-10-CM

## 2019-04-03 DIAGNOSIS — E876 Hypokalemia: Secondary | ICD-10-CM

## 2019-04-03 DIAGNOSIS — E875 Hyperkalemia: Secondary | ICD-10-CM

## 2019-04-03 LAB — RENAL FUNCTION PANEL
Albumin: 2.4 g/dL — ABNORMAL LOW (ref 3.5–5.0)
Anion gap: 8 (ref 5–15)
BUN: 8 mg/dL (ref 8–23)
CO2: 31 mmol/L (ref 22–32)
Calcium: 8.7 mg/dL — ABNORMAL LOW (ref 8.9–10.3)
Chloride: 106 mmol/L (ref 98–111)
Creatinine, Ser: 0.56 mg/dL — ABNORMAL LOW (ref 0.61–1.24)
GFR calc Af Amer: >60 mL/min (ref >60)
GFR calc non Af Amer: >60 mL/min (ref >60)
Glucose, Bld: 88 mg/dL (ref 70–99)
Phosphorus: 3 mg/dL (ref 2.5–4.6)
Potassium: 5.4 mmol/L — ABNORMAL HIGH (ref 3.5–5.1)
Sodium: 145 mmol/L (ref 135–145)

## 2019-04-03 LAB — CBC WITH DIFFERENTIAL/PLATELET
Abs Immature Granulocytes: 0.05 10*3/uL (ref 0.00–0.07)
Basophils Absolute: 0 10*3/uL (ref 0.0–0.1)
Basophils Relative: 0 %
Eosinophils Absolute: 0.2 10*3/uL (ref 0.0–0.5)
Eosinophils Relative: 2 %
HCT: 38.4 % — ABNORMAL LOW (ref 39.0–52.0)
Hemoglobin: 12.6 g/dL — ABNORMAL LOW (ref 13.0–17.0)
Immature Granulocytes: 1 %
Lymphocytes Relative: 27 %
Lymphs Abs: 2.1 10*3/uL (ref 0.7–4.0)
MCH: 32.9 pg (ref 26.0–34.0)
MCHC: 32.8 g/dL (ref 30.0–36.0)
MCV: 100.3 fL — ABNORMAL HIGH (ref 80.0–100.0)
Monocytes Absolute: 0.5 10*3/uL (ref 0.1–1.0)
Monocytes Relative: 6 %
Neutro Abs: 5 10*3/uL (ref 1.7–7.7)
Neutrophils Relative %: 64 %
Platelets: 271 10*3/uL (ref 150–400)
RBC: 3.83 MIL/uL — ABNORMAL LOW (ref 4.22–5.81)
RDW: 15.3 % (ref 11.5–15.5)
WBC: 7.9 10*3/uL (ref 4.0–10.5)
nRBC: 0 % (ref 0.0–0.2)

## 2019-04-03 LAB — CULTURE, BLOOD (ROUTINE X 2)
Culture: NO GROWTH
Culture: NO GROWTH
Special Requests: ADEQUATE

## 2019-04-03 LAB — MAGNESIUM: Magnesium: 1.8 mg/dL (ref 1.7–2.4)

## 2019-04-03 LAB — SARS CORONAVIRUS 2 (TAT 6-24 HRS): SARS Coronavirus 2: NEGATIVE

## 2019-04-03 MED ORDER — AMOXICILLIN-POT CLAVULANATE 875-125 MG PO TABS: 1.0000 | ORAL_TABLET | Freq: Two times a day (BID) | ORAL | 0 refills | Status: AC

## 2019-04-03 MED ORDER — CARBAMAZEPINE 100 MG/5ML PO SUSP: 300.0000 mg | Freq: Two times a day (BID) | ORAL | 1 refills | Status: DC

## 2019-04-03 MED ORDER — SODIUM ZIRCONIUM CYCLOSILICATE 5 G PO PACK
5.0000 g | PACK | Freq: Once | ORAL | Status: AC
  Administered 2019-04-03: 5 g via ORAL
  Filled 2019-04-03: qty 1

## 2019-04-03 MED ORDER — CLONAZEPAM 0.25 MG PO TBDP: 0.2500 mg | ORAL_TABLET | Freq: Two times a day (BID) | ORAL | 0 refills | Status: DC

## 2019-04-03 NOTE — Discharge Instructions (Signed)
Dysphagia Eating Plan, Pureed This diet is helpful for people with moderate to severe swallowing problems. Pureed foods are smooth and are prepared without lumps so that they can be swallowed safely. Work with your health care provider and your diet and nutrition specialist (dietitian) to make sure that you are following the diet safely and getting all the nutrients you need. What are tips for following this plan? General instructions  You may eat foods that are soft and have a pudding-like texture.  Do not eat foods that you have to chew. If you have to chew the food, then you cannot eat it.  Avoid foods that are hard, dry, sticky, chunky, lumpy, or stringy. Also avoid foods with nuts, seeds, raisins, skins, or pulp.  You may be instructed to thicken liquids. Follow your health care provider's instructions about how to do this and to what consistency. Cooking   If a food is not originally a smooth texture, you may be able to eat the food after: ? Pureeing it. This can be done with a blender. ? Moistening it. This can be done by adding juice, cooking liquid, gravy, or sauce to a dry food and then pureeing it. For example, you may have bread if you soak it in milk and puree it.  If a food is too thin, you may add a commercial thickener, corn starch, rice cereal, or potato flakes to thicken it.  Strain and throw away any liquid that separates from a solid pureed food before eating.  Strain lumps, chunks, pulp, and seeds from pureed foods before eating.  Reheat foods slowly to prevent a tough crust from forming. Meal planning  Eat a variety of foods to get all the nutrients you need.  Add dry milk or protein powder to food to increase calories and protein content.  Follow your meal plan as told by your dietitian. What foods are allowed? The items listed may not be a complete list. Talk with your dietitian about what dietary choices are best for you. Grains Soft breads, pancakes,  French toast, muffins, and bread stuffing pureed to a smooth, moist texture, without nuts or seeds. Cooked cereals that have a pudding-like consistency, such as cream of wheat or farina. Pureed oatmeal. Pureed, well-cooked pasta and rice. Vegetables Pureed vegetables. Smooth tomato paste or sauce. Mashed or pureed potatoes without skin. Fruits Pureed fruits such as melons and apples without seeds or pulp. Mashed bananas. Mashed avocado. Fruit juices without pulp or seeds. Meats and other protein foods Pureed meat, poultry, and fish. Smooth pate or liverwurst. Smooth souffles. Pureed beans (such as lentils). Pureed eggs. Smooth nut and seed butters. Pureed tofu. Dairy Yogurt. Milk. Pureed cottage cheese. Nutritional dairy drinks or shakes. Cream cheese. Smooth pudding, ice cream, sherbet, and malts. Fats and oils Butter. Margarine. Vegetable oils. Smooth and strained gravy. Sour cream. Mayonnaise. Smooth sauces such as white sauce, cheese sauce, or hollandaise sauce. Sweets and desserts Moistened and pureed cookies and cakes. Whipped topping. Gelatin. Pudding pops. Seasoning and other foods Finely ground spices. Jelly. Honey. Pureed casseroles. Strained soups. Pureed sandwiches. Beverages Anything prepared at the consistency recommended by your dietitian. What foods are not allowed? The items listed may not be a complete list. Talk with your dietitian about what dietary choices are best for you. Grains Oatmeal. Dry cereals. Hard breads. Breads with seeds or nuts. Whole pasta, rice, or other grains. Whole pancakes, waffles, biscuits, muffins, or rolls. Vegetables Whole vegetables. Stringy vegetables (such as celery). Tomatoes or tomato   sauce with seeds. Fried vegetables. Fruits Whole fresh, frozen, canned, or dried fruits that have not been pureed. Stringy fruits, such as pineapple or coconut. Watermelon with seeds. Dried fruit or fruit leather. Meat and other protein foods Whole or ground  meat, fish, or poultry. Dried or cooked lentils or legumes that have been cooked but not mashed or pureed. Non-pureed eggs. Nuts and seeds. Crunchy peanut butter. Whole tofu or other meat alternatives. Dairy Cheese cubes or slices. Non-pureed cottage cheese. Yogurt with fruit chunks. Fats and oils All fats and sauces that have lumps or chunks. Sweets and desserts Solid desserts. Sticky, chewy sweets (such as licorice and caramel). Candy with nuts or coconut. Seasoning and other foods Coarse or seeded herbs and spices. Chunky preserves. Jams with seeds. Whole sandwiches. Non-pureed casseroles. Chunky soups. Summary  Pureed foods can be helpful for people with moderate to severe swallowing problems.  On the dysphagia eating plan, you may eat foods that are soft and have a pudding-like texture. You should avoid foods that you have to chew. If you have to chew the food, then you cannot eat it.  You may be instructed to thicken liquids. Follow your health care provider's instructions about how to do this and to what consistency. This information is not intended to replace advice given to you by your health care provider. Make sure you discuss any questions you have with your health care provider. Document Revised: 04/17/2018 Document Reviewed: 02/28/2016 Elsevier Patient Education  2020 Elsevier Inc.  

## 2019-04-03 NOTE — TOC Transition Note (Addendum)
Transition of Care [facility]) - CM/SW Discharge Note   Patient Details  Name: Lee [name] MRN: [id number] Date of Birth: 24-Dec-1950  Transition of Care [facility] CM/SW Contact:  Terrial Rhodes, LCSWA Phone Number: 04/03/2019, 2:40 PM   Clinical Narrative:    Patient will DC to: [facility]  Anticipated DC date: 04/03/2019  Family notified: [name]  Transport by: Sharin Mons  ?  Per MD patient ready for DC to [facility] . RN, patient, patient's family, and facility notified of DC. Discharge Summary sent to facility. RN given number for report tele# [phone]. DC packet on chart. Ambulance transport requested for patient.  CSW signing off.     Final next level of care: Skilled Nursing Facility Barriers to Discharge: No Barriers Identified   Patient Goals and CMS Choice Patient states their goals for this hospitalization and ongoing recovery are:: to go to skilled nursing facility CMS Medicare.gov Compare Post Acute Care list provided to:: Patient Represenative (must comment)(Wife [name]) Choice offered to / list presented to : Spouse  Discharge Placement   Existing PASRR number confirmed : 03/28/19          Patient chooses bed at: [facility] Patient to be transferred to facility by: [facility] Name of family member notified: Elita Quick [phone] Patient and family notified of of transfer: 04/03/19  Discharge Plan and Services     Post Acute Care Choice: Skilled Nursing Facility                               Social Determinants of Health (SDOH) Interventions     Readmission Risk Interventions No flowsheet data found.

## 2019-04-03 NOTE — Discharge Summary (Addendum)
. Physician Discharge Summary  Lee [name] JHE:[id number] DOB: 1950-12-29 DOA: 03/29/2019  PCP: Mirna Mires, MD  Admit date: 03/29/2019 Discharge date: 04/03/2019  Admitted From: [facility] Disposition:  Discharged to [facility]  Recommendations for Outpatient Follow-up:  1. Follow up with site physician in 3 days. 2. Please obtain BMP/CBC in one week  Discharge Condition: Stable  CODE STATUS: FULL   Brief/Interim Summary: 69 year old with history of seizure disorder, CVA, COPD, dementia, alcohol use, HTN brought to the hospital for shortness of breath and vomiting. Admitted for concerns of aspiration pneumonia, started on IV Unasyn. COVID-19 negative. Speech recommended dysphagia 1 diet.  3/26: K+ slightly up. Will give one dose of lokelma. Will need follow up labs in 1 week with site physician. He is otherwise stable for discharge. He will go out on 2L [place]. Wean as tolerated. Recommend outpt palliative care consult.    Discharge Diagnoses:  Principal Problem:   Acute respiratory failure with hypoxemia (HCC) Active Problems:   Peripheral vascular disease (HCC)   Altered mental status   Stroke (HCC)   Seizures (HCC)   Essential hypertension   Aspiration pneumonia (HCC)   Dementia (HCC)   Acute respiratory failure with hypoxia (HCC)   Sepsis (HCC)   Macrocytosis  Acute hypoxic respiratory failure secondary to aspiration pneumonia, multi lobar     - Continue IV Unasyn, plan for total of 7 days.       - Once hypoxia is improved he can be transitioned to oral Augmentin to complete the course as well. Last day 3/27.      - COVID-19 test negative, speech and swallow recommending dysphagia 1 diet.     - Chest x-ray shows multilobar bibasilar pneumonia     - Bronchodilators.     - on RA but WOB seems increased; continue abx, hold lasix     - reassess for possible AM discharge     - 3/26: He is comfortable right now on 2 - 3L [place] O2 and satting 100%; he can be weaned at  his facility; will send out on 2L [place]; augmentin through tomorrow  Seizure disorder     - Keppra, valproic acid, Vimpat and carbamazepine  Essential hypertension     - amlodipine and hydralazine; may need hydralazine increase  History of COPD     - duonebs PRN  History of dementia without behavioral disturbances     - Supportive care.  Delirium protocol.  Electrolyte disturbance (Hypo, then hyperkalemia)     - will get one-time lokelma; will need repeat labs in 1 week.   Pressure Injury 03/01/19 Heel Left Deep Tissue Pressure Injury - Purple or maroon localized area of discolored intact skin or blood-filled blister due to damage of underlying soft tissue from pressure and/or shear. (Active) 03/01/19 0800 Location: Heel Location Orientation: Left Staging: Deep Tissue Pressure Injury - Purple or maroon localized area of discolored intact skin or blood-filled blister due to damage of underlying soft tissue from pressure and/or shear. Wound Description (Comments):  Present on Admission: No   Pressure Injury 03/04/19 Sacrum Mid Stage 2 -  Partial thickness loss of dermis presenting as a shallow open injury with a red, pink wound bed without slough. (Active) 03/04/19 1300 Location: Sacrum Location Orientation: Mid Staging: Stage 2 -  Partial thickness loss of dermis presenting as a shallow open injury with a red, pink wound bed without slough. Wound Description (Comments):  Present on Admission: No   Pressure Injury 03/31/19 Heel Right  Deep Tissue Pressure Injury - Purple or maroon localized area of discolored intact skin or blood-filled blister due to damage of underlying soft tissue from pressure and/or shear. black (Active) 03/31/19 0902 Location: Heel Location Orientation: Right Staging: Deep Tissue Pressure Injury - Purple or maroon localized area of discolored intact skin or blood-filled blister due to damage of underlying soft tissue from pressure and/or shear. Wound  Description (Comments): black Present on Admission: Yes (old dressing from facility)  Discharge Instructions   Allergies as of 04/03/2019      Reactions   Orange Juice [orange Oil] Diarrhea   Vicodin [hydrocodone-acetaminophen] Other (See Comments)   "triggered seizure both here and at home when he tried to take it" (01/22/2012)   Penicillins Nausea And Vomiting   Tolerates Zosyn and Unasyn. Has patient had a PCN reaction causing immediate rash, facial/tongue/throat swelling, SOB or lightheadedness with hypotension: no Has patient had a PCN reaction causing severe rash involving mucus membranes or skin necrosis: no Has patient had a PCN reaction that required hospitalization: no Has patient had a PCN reaction occurring within the last 10 years: no If all of the above answers are "NO", then may proceed with Cephalosporin u   Oxycodone    Causes Seizures      Medication List    STOP taking these medications   carbamazepine 200 MG tablet Commonly known as: Epitol Replaced by: carBAMazepine 100 MG/5ML suspension   clonazePAM 0.5 MG tablet Commonly known as: KLONOPIN Replaced by: clonazePAM 0.25 MG disintegrating tablet   clopidogrel 75 MG tablet Commonly known as: PLAVIX   docusate sodium 100 MG capsule Commonly known as: COLACE   hydrOXYzine 10 MG tablet Commonly known as: ATARAX/VISTARIL     TAKE these medications   acetaminophen 325 MG tablet Commonly known as: TYLENOL Take 650 mg by mouth every 12 (twelve) hours.   albuterol 108 (90 Base) MCG/ACT inhaler Commonly known as: VENTOLIN HFA Inhale 2 puffs into the lungs every 6 (six) hours as needed for wheezing or shortness of breath.   amLODipine 10 MG tablet Commonly known as: NORVASC Take 1 tablet (10 mg total) by mouth daily.   amoxicillin-clavulanate 875-125 MG tablet Commonly known as: Augmentin Take 1 tablet by mouth 2 (two) times daily for 3 doses.   ARTIFICIAL TEARS OP Place 2 drops into the left eye 2  (two) times daily.   aspirin 81 MG chewable tablet Chew 81 mg by mouth daily.   B-complex with vitamin C tablet Take 1 tablet by mouth daily.   carBAMazepine 100 MG/5ML suspension Commonly known as: TEGRETOL Take 15 mLs (300 mg total) by mouth 2 (two) times daily. Replaces: carbamazepine 200 MG tablet   cholecalciferol 1000 units tablet Commonly known as: VITAMIN D Take 1,000 Units by mouth once a week. On Mondays.   clonazePAM 0.25 MG disintegrating tablet Commonly known as: KLONOPIN Take 1 tablet (0.25 mg total) by mouth 2 (two) times daily for 3 days. Replaces: clonazePAM 0.5 MG tablet   feeding supplement (ENSURE ENLIVE) Liqd Take 237 mLs by mouth 3 (three) times daily between meals.   folic acid 1 MG tablet Commonly known as: FOLVITE Take 1 tablet (1 mg total) by mouth daily.   hydrALAZINE 25 MG tablet Commonly known as: APRESOLINE Take 1 tablet (25 mg total) by mouth every 8 (eight) hours.   isosorbide mononitrate 30 MG 24 hr tablet Commonly known as: IMDUR Take 30 mg by mouth at bedtime.   levETIRAcetam 100 MG/ML solution Commonly known  as: KEPPRA Take 15 mLs (1,500 mg total) by mouth 2 (two) times daily.   loratadine 10 MG tablet Commonly known as: CLARITIN Take 10 mg by mouth every other day.   magnesium oxide 400 MG tablet Commonly known as: MAG-OX Take 400 mg by mouth daily.   multivitamin with minerals Tabs tablet Take 1 tablet by mouth daily.   NON FORMULARY Apply 1 mL topically in the morning, at noon, and at bedtime. ABH gel (Ativan 1mg Benito Mccreedy/Benadryl 12.5mg /Haldol 1mg ). Apply 1mL topically to forearm three times daily for mood disorder.   pantoprazole 40 MG tablet Commonly known as: PROTONIX Take 1 tablet (40 mg total) by mouth at bedtime.   polyethylene glycol 17 g packet Commonly known as: MIRALAX / GLYCOLAX Take 17 g by mouth at bedtime. Take 17 grams mix with 8OZ of apple juice for constipation.   senna 8.6 MG Tabs tablet Commonly known  as: SENOKOT Take 2 tablets by mouth at bedtime.   simvastatin 10 MG tablet Commonly known as: ZOCOR Take 10 mg by mouth at bedtime.   thiamine 100 MG tablet Take 1 tablet (100 mg total) by mouth daily.   valproic acid 250 MG/5ML solution Commonly known as: DEPAKENE Take 15 mLs (750 mg total) by mouth every 8 (eight) hours. What changed: when to take this   Vimpat 150 MG Tabs Generic drug: Lacosamide Take 300 mg by mouth in the morning and at bedtime.   vitamin C 1000 MG tablet Take 1,000 mg by mouth daily.       Allergies  Allergen Reactions  . Orange Juice [Orange Oil] Diarrhea  . Penicillins Nausea And Vomiting    Tolerates Zosyn. "might have been an overdose" (01/22/2012) Has patient had a PCN reaction causing immediate rash, facial/tongue/throat swelling, SOB or lightheadedness with hypotension: yes Has patient had a PCN reaction causing severe rash involving mucus membranes or skin necrosis: unknown Has patient had a PCN reaction that required hospitalization: no Has patient had a PCN reaction occurring within the last 10 years: no If all of the above answers are "NO", then may proceed with Cephalosporin u  . Vicodin [Hydrocodone-Acetaminophen] Other (See Comments)    "triggered seizure both here and at home when he tried to take it" (01/22/2012)  . Oxycodone     Causes Seizures    Procedures/Studies: DG Chest Port 1 View  Result Date: 03/29/2019 CLINICAL DATA:  [age] year old male with history of shortness of breath. EXAM: PORTABLE CHEST 1 VIEW COMPARISON:  Chest x-ray 03/04/2019. FINDINGS: Improving aeration in the lung bases bilaterally, most compatible with resolving multilobar pneumonia. There continues to be some ill-defined bibasilar opacities (left greater than right). No pleural effusions. No pneumothorax. No definite suspicious appearing pulmonary nodules or masses are noted. No evidence of pulmonary edema. Heart size is normal. Upper mediastinal contours are  within normal limits. Aortic atherosclerosis. IMPRESSION: 1. Resolving multilobar bibasilar pneumonia, as above. 2. Aortic atherosclerosis. Electronically Signed   By: Trudie Reedaniel  Entrikin M.D.   On: 03/29/2019 13:32   DG CHEST PORT 1 VIEW  Result Date: 03/04/2019 CLINICAL DATA:  Follow up aspiration. EXAM: PORTABLE CHEST 1 VIEW COMPARISON:  Radiographs 02/26/2019, 02/25/2019 and 02/24/2019. FINDINGS: 1611 hours. The patient is rotated to the left, and his mandible overlies the left lung apex. The heart size and mediastinal contours are stable with aortic atherosclerosis. The enteric tube has been removed. There are bibasilar airspace opacities with interval slight worsening in the left lower lobe. No significant changes are seen on the  right. There is no significant pleural effusion or pneumothorax. IMPRESSION: Interval worsening of left lower lobe airspace disease consistent with aspiration pneumonia. Stable right basilar airspace disease. Electronically Signed   By: Carey Bullocks M.D.   On: 03/04/2019 18:08      Subjective: No acute events ON.   Discharge Exam: Vitals:   04/03/19 0944 04/03/19 1300  BP: (!) 167/84 (!) 151/83  Pulse: 86 63  Resp: 17 18  Temp: (!) 97.5 F (36.4 C) 98.2 F (36.8 C)  SpO2: 95%    Vitals:   04/02/19 2125 04/03/19 0548 04/03/19 0944 04/03/19 1300  BP: (!) 164/88 (!) 157/79 (!) 167/84 (!) 151/83  Pulse: 71 68 86 63  Resp: Temp: 98 F (36.7 C) 98.1 F (36.7 C) (!) 97.5 F (36.4 C) 98.2 F (36.8 C)  TempSrc: Oral Oral Oral Oral  SpO2: (!) 79% 100% 95%   Weight:  61.4 kg    Height:        General: 69 y.o. male resting in bed in NAD Cardiovascular: RRR, +S1, S2, no m/g/r, equal pulses throughout Respiratory: CTABL, no w/r/r, normal WOB, on 3L [place] GI: BS+, NDNT, no masses noted, no organomegaly noted MSK: No e/c/c Neuro: somenolent   The results of significant diagnostics from this hospitalization (including imaging, microbiology,  ancillary and laboratory) are listed below for reference.     Microbiology: Recent Results (from the past 240 hour(s))  SARS CORONAVIRUS 2 (TAT 6-24 HRS) Nasopharyngeal Nasopharyngeal Swab     Status: None   Collection Time: 03/29/19  3:12 PM   Specimen: Nasopharyngeal Swab  Result Value Ref Range Status   SARS Coronavirus 2 NEGATIVE NEGATIVE Final    Comment: (NOTE) SARS-CoV-2 target nucleic acids are NOT DETECTED. The SARS-CoV-2 RNA is generally detectable in upper and lower respiratory specimens during the acute phase of infection. Negative results do not preclude SARS-CoV-2 infection, do not rule out co-infections with other pathogens, and should not be used as the sole basis for treatment or other patient management decisions. Negative results must be combined with clinical observations, patient history, and epidemiological information. The expected result is Negative. Fact Sheet for Patients: HairSlick.no Fact Sheet for Healthcare Providers: quierodirigir.com This test is not yet approved or cleared by the [place] FDA and  has been authorized for detection and/or diagnosis of SARS-CoV-2 by FDA under an Emergency Use Authorization (EUA). This EUA will remain  in effect (meaning this test can be used) for the duration of the COVID-19 declaration under Section 56 4(b)(1) of the Act, 21 U.S.C. section 360bbb-3(b)(1), unless the authorization is terminated or revoked sooner. Performed at [facility], [address], [place], [place] [address]   Blood culture (routine x 2)     Status: None   Collection Time: 03/29/19  3:53 PM   Specimen: BLOOD RIGHT FOREARM  Result Value Ref Range Status   Specimen Description BLOOD RIGHT FOREARM  Final   Special Requests   Final    BOTTLES DRAWN AEROBIC AND ANAEROBIC Blood Culture adequate volume   Culture   Final    NO GROWTH 5 DAYS Performed at [facility],  [address], [place], [place] [address]    Report Status 04/03/2019 FINAL  Final  Blood culture (routine x 2)     Status: None   Collection Time: 03/29/19  3:58 PM   Specimen: BLOOD RIGHT HAND  Result Value Ref Range Status   Specimen Description BLOOD  RIGHT HAND  Final   Special Requests   Final    BOTTLES DRAWN AEROBIC AND ANAEROBIC Blood Culture results may not be optimal due to an inadequate volume of blood received in culture bottles   Culture   Final    NO GROWTH 5 DAYS Performed at [facility], [address], [place], [place] [address]    Report Status 04/03/2019 FINAL  Final  SARS CORONAVIRUS 2 (TAT 6-24 HRS) Nasopharyngeal Nasopharyngeal Swab     Status: None   Collection Time: 04/03/19  2:22 AM   Specimen: Nasopharyngeal Swab  Result Value Ref Range Status   SARS Coronavirus 2 NEGATIVE NEGATIVE Final    Comment: (NOTE) SARS-CoV-2 target nucleic acids are NOT DETECTED. The SARS-CoV-2 RNA is generally detectable in upper and lower respiratory specimens during the acute phase of infection. Negative results do not preclude SARS-CoV-2 infection, do not rule out co-infections with other pathogens, and should not be used as the sole basis for treatment or other patient management decisions. Negative results must be combined with clinical observations, patient history, and epidemiological information. The expected result is Negative. Fact Sheet for Patients: HairSlick.no Fact Sheet for Healthcare Providers: quierodirigir.com This test is not yet approved or cleared by the [place] FDA and  has been authorized for detection and/or diagnosis of SARS-CoV-2 by FDA under an Emergency Use Authorization (EUA). This EUA will remain  in effect (meaning this test can be used) for the duration of the COVID-19 declaration under Section 56 4(b)(1) of the Act, 21 U.S.C. section 360bbb-3(b)(1), unless the authorization is  terminated or revoked sooner. Performed at [facility], [address], [place], [place] [address]      Labs: BNP (last 3 results) Recent Labs    02/23/19 1626 03/29/19 1318  BNP 76.6 62.9   Basic Metabolic Panel: Recent Labs  Lab 03/29/19 1318 03/29/19 1318 03/29/19 1518 03/29/19 1757 03/30/19 0422 04/01/19 0446 04/02/19 0549 04/03/19 0529  NA 143   < > 141  --  143 143 145 145  K 4.5   < > 3.8  --  4.4 3.5 3.2* 5.4*  CL 101  --   --   --  106 107 104 106  CO2 25  --   --   --  25 27 33* 31  GLUCOSE 111*  --   --   --  88 94 97 88  BUN 14  --   --   --  14 6* 7* 8  CREATININE 0.73   < >  --  0.85 0.69 0.55* 0.59* 0.56*  CALCIUM 9.3  --   --   --  8.6* 8.3* 8.6* 8.7*  MG  --   --   --  1.5*  --   --  1.8 1.8  PHOS  --   --   --   --   --   --   --  3.0   < > = values in this interval not displayed.   Liver Function Tests: Recent Labs  Lab 03/29/19 1318 04/03/19 0529  AST 38  --   ALT 32  --   ALKPHOS 135*  --   BILITOT 0.5  --   PROT 7.8  --   ALBUMIN 3.3* 2.4*   No results for input(s): LIPASE, AMYLASE in the last 168 hours. No results for input(s): AMMONIA in the last 168 hours. CBC: Recent Labs  Lab 03/29/19 1318 03/29/19 1318 03/29/19 1518 03/29/19  1757 03/30/19 0422 04/01/19 0446 04/03/19 1114  WBC 13.5*  --   --  12.6* 15.1* 11.6* 7.9  NEUTROABS 12.1*  --   --   --   --   --  5.0  HGB 15.8   < > 14.6 13.8 13.1 12.6* 12.6*  HCT 48.7   < > 43.0 42.5 39.8 38.2* 38.4*  MCV 102.5*  --   --  101.9* 100.0 100.5* 100.3*  PLT 286  --   --  232 203 206 271   < > = values in this interval not displayed.   Cardiac Enzymes: No results for input(s): CKTOTAL, CKMB, CKMBINDEX, TROPONINI in the last 168 hours. BNP: Invalid input(s): POCBNP CBG: No results for input(s): GLUCAP in the last 168 hours. D-Dimer No results for input(s): DDIMER in the last 72 hours. Hgb A1c No results for input(s): HGBA1C in the last 72 hours. Lipid Profile No  results for input(s): CHOL, HDL, LDLCALC, TRIG, CHOLHDL, LDLDIRECT in the last 72 hours. Thyroid function studies No results for input(s): TSH, T4TOTAL, T3FREE, THYROIDAB in the last 72 hours.  Invalid input(s): FREET3 Anemia work up No results for input(s): VITAMINB12, FOLATE, FERRITIN, TIBC, IRON, RETICCTPCT in the last 72 hours. Urinalysis    Component Value Date/Time   COLORURINE YELLOW 02/23/2019 1720   APPEARANCEUR HAZY (A) 02/23/2019 1720   LABSPEC 1.016 02/23/2019 1720   PHURINE 6.0 02/23/2019 1720   GLUCOSEU NEGATIVE 02/23/2019 1720   HGBUR NEGATIVE 02/23/2019 1720   BILIRUBINUR NEGATIVE 02/23/2019 1720   KETONESUR NEGATIVE 02/23/2019 1720   PROTEINUR 30 (A) 02/23/2019 1720   UROBILINOGEN 0.2 03/22/2014 1206   NITRITE NEGATIVE 02/23/2019 1720   LEUKOCYTESUR NEGATIVE 02/23/2019 1720   Sepsis Labs Invalid input(s): PROCALCITONIN,  WBC,  LACTICIDVEN Microbiology Recent Results (from the past 240 hour(s))  SARS CORONAVIRUS 2 (TAT 6-24 HRS) Nasopharyngeal Nasopharyngeal Swab     Status: None   Collection Time: 03/29/19  3:12 PM   Specimen: Nasopharyngeal Swab  Result Value Ref Range Status   SARS Coronavirus 2 NEGATIVE NEGATIVE Final    Comment: (NOTE) SARS-CoV-2 target nucleic acids are NOT DETECTED. The SARS-CoV-2 RNA is generally detectable in upper and lower respiratory specimens during the acute phase of infection. Negative results do not preclude SARS-CoV-2 infection, do not rule out co-infections with other pathogens, and should not be used as the sole basis for treatment or other patient management decisions. Negative results must be combined with clinical observations, patient history, and epidemiological information. The expected result is Negative. Fact Sheet for Patients: SugarRoll.be Fact Sheet for Healthcare Providers: https://www.woods-mathews.com/ This test is not yet approved or cleared by the [place] FDA  and  has been authorized for detection and/or diagnosis of SARS-CoV-2 by FDA under an Emergency Use Authorization (EUA). This EUA will remain  in effect (meaning this test can be used) for the duration of the COVID-19 declaration under Section 56 4(b)(1) of the Act, 21 U.S.C. section 360bbb-3(b)(1), unless the authorization is terminated or revoked sooner. Performed at [facility], [place] [address] [place], [place] [address]   Blood culture (routine x 2)     Status: None   Collection Time: 03/29/19  3:53 PM   Specimen: BLOOD RIGHT FOREARM  Result Value Ref Range Status   Specimen Description BLOOD RIGHT FOREARM  Final   Special Requests   Final    BOTTLES DRAWN AEROBIC AND ANAEROBIC Blood Culture adequate volume   Culture   Final    NO  GROWTH 5 DAYS Performed at [facility], [address] [place], [place] [address]    Report Status 04/03/2019 FINAL  Final  Blood culture (routine x 2)     Status: None   Collection Time: 03/29/19  3:58 PM   Specimen: BLOOD RIGHT HAND  Result Value Ref Range Status   Specimen Description BLOOD RIGHT HAND  Final   Special Requests   Final    BOTTLES DRAWN AEROBIC AND ANAEROBIC Blood Culture results may not be optimal due to an inadequate volume of blood received in culture bottles   Culture   Final    NO GROWTH 5 DAYS Performed at [facility], [address] [place], [place] [address]    Report Status 04/03/2019 FINAL  Final  SARS CORONAVIRUS 2 (TAT 6-24 HRS) Nasopharyngeal Nasopharyngeal Swab     Status: None   Collection Time: 04/03/19  2:22 AM   Specimen: Nasopharyngeal Swab  Result Value Ref Range Status   SARS Coronavirus 2 NEGATIVE NEGATIVE Final    Comment: (NOTE) SARS-CoV-2 target nucleic acids are NOT DETECTED. The SARS-CoV-2 RNA is generally detectable in upper and lower respiratory specimens during the acute phase of infection. Negative results do not preclude SARS-CoV-2 infection, do not rule  out co-infections with other pathogens, and should not be used as the sole basis for treatment or other patient management decisions. Negative results must be combined with clinical observations, patient history, and epidemiological information. The expected result is Negative. Fact Sheet for Patients: HairSlick.no Fact Sheet for Healthcare Providers: quierodirigir.com This test is not yet approved or cleared by the [place] FDA and  has been authorized for detection and/or diagnosis of SARS-CoV-2 by FDA under an Emergency Use Authorization (EUA). This EUA will remain  in effect (meaning this test can be used) for the duration of the COVID-19 declaration under Section 56 4(b)(1) of the Act, 21 U.S.C. section 360bbb-3(b)(1), unless the authorization is terminated or revoked sooner. Performed at [facility], [address], [place], [place] [address]      Time coordinating discharge: 35 minutes  SIGNED:   Teddy Spike, DO  [facility] 04/03/2019, 2:13 PM   If 7PM-7AM, please contact night-coverage [email or web address]

## 2019-04-03 NOTE — Plan of Care (Signed)

## 2019-04-06 DIAGNOSIS — E878 Other disorders of electrolyte and fluid balance, not elsewhere classified: Secondary | ICD-10-CM | POA: Diagnosis not present

## 2019-04-06 DIAGNOSIS — L89616 Pressure-induced deep tissue damage of right heel: Secondary | ICD-10-CM | POA: Diagnosis not present

## 2019-04-06 DIAGNOSIS — J690 Pneumonitis due to inhalation of food and vomit: Secondary | ICD-10-CM | POA: Diagnosis not present

## 2019-04-06 DIAGNOSIS — J984 Other disorders of lung: Secondary | ICD-10-CM | POA: Diagnosis not present

## 2019-04-06 DIAGNOSIS — R0902 Hypoxemia: Secondary | ICD-10-CM | POA: Diagnosis not present

## 2019-04-07 ENCOUNTER — Non-Acute Institutional Stay: Payer: Medicare Other | Admitting: Internal Medicine

## 2019-04-07 DIAGNOSIS — M79662 Pain in left lower leg: Secondary | ICD-10-CM | POA: Diagnosis not present

## 2019-04-07 DIAGNOSIS — J9690 Respiratory failure, unspecified, unspecified whether with hypoxia or hypercapnia: Secondary | ICD-10-CM | POA: Diagnosis not present

## 2019-04-07 DIAGNOSIS — R1319 Other dysphagia: Secondary | ICD-10-CM | POA: Diagnosis not present

## 2019-04-07 DIAGNOSIS — J9601 Acute respiratory failure with hypoxia: Secondary | ICD-10-CM | POA: Diagnosis not present

## 2019-04-07 DIAGNOSIS — R1312 Dysphagia, oropharyngeal phase: Secondary | ICD-10-CM | POA: Diagnosis not present

## 2019-04-07 DIAGNOSIS — R278 Other lack of coordination: Secondary | ICD-10-CM | POA: Diagnosis not present

## 2019-04-07 DIAGNOSIS — Z9181 History of falling: Secondary | ICD-10-CM | POA: Diagnosis not present

## 2019-04-07 DIAGNOSIS — M24571 Contracture, right ankle: Secondary | ICD-10-CM | POA: Diagnosis not present

## 2019-04-07 DIAGNOSIS — R293 Abnormal posture: Secondary | ICD-10-CM | POA: Diagnosis not present

## 2019-04-07 DIAGNOSIS — I10 Essential (primary) hypertension: Secondary | ICD-10-CM | POA: Diagnosis not present

## 2019-04-07 DIAGNOSIS — J984 Other disorders of lung: Secondary | ICD-10-CM | POA: Diagnosis not present

## 2019-04-07 DIAGNOSIS — M2450 Contracture, unspecified joint: Secondary | ICD-10-CM | POA: Diagnosis not present

## 2019-04-07 DIAGNOSIS — M79661 Pain in right lower leg: Secondary | ICD-10-CM | POA: Diagnosis not present

## 2019-04-07 DIAGNOSIS — J690 Pneumonitis due to inhalation of food and vomit: Secondary | ICD-10-CM | POA: Diagnosis not present

## 2019-04-07 DIAGNOSIS — R2681 Unsteadiness on feet: Secondary | ICD-10-CM | POA: Diagnosis not present

## 2019-04-08 ENCOUNTER — Other Ambulatory Visit: Payer: Self-pay

## 2019-04-08 DIAGNOSIS — R1312 Dysphagia, oropharyngeal phase: Secondary | ICD-10-CM | POA: Diagnosis not present

## 2019-04-08 DIAGNOSIS — R278 Other lack of coordination: Secondary | ICD-10-CM | POA: Diagnosis not present

## 2019-04-08 DIAGNOSIS — M79661 Pain in right lower leg: Secondary | ICD-10-CM | POA: Diagnosis not present

## 2019-04-08 DIAGNOSIS — R1319 Other dysphagia: Secondary | ICD-10-CM | POA: Diagnosis not present

## 2019-04-08 DIAGNOSIS — M2450 Contracture, unspecified joint: Secondary | ICD-10-CM | POA: Diagnosis not present

## 2019-04-08 DIAGNOSIS — M24571 Contracture, right ankle: Secondary | ICD-10-CM | POA: Diagnosis not present

## 2019-04-08 DIAGNOSIS — R293 Abnormal posture: Secondary | ICD-10-CM | POA: Diagnosis not present

## 2019-04-08 DIAGNOSIS — J690 Pneumonitis due to inhalation of food and vomit: Secondary | ICD-10-CM | POA: Diagnosis not present

## 2019-04-08 DIAGNOSIS — Z9181 History of falling: Secondary | ICD-10-CM | POA: Diagnosis not present

## 2019-04-08 DIAGNOSIS — R2681 Unsteadiness on feet: Secondary | ICD-10-CM | POA: Diagnosis not present

## 2019-04-08 DIAGNOSIS — J9690 Respiratory failure, unspecified, unspecified whether with hypoxia or hypercapnia: Secondary | ICD-10-CM | POA: Diagnosis not present

## 2019-04-08 DIAGNOSIS — J9601 Acute respiratory failure with hypoxia: Secondary | ICD-10-CM | POA: Diagnosis not present

## 2019-04-08 DIAGNOSIS — M79662 Pain in left lower leg: Secondary | ICD-10-CM | POA: Diagnosis not present

## 2019-04-09 DIAGNOSIS — Z9181 History of falling: Secondary | ICD-10-CM | POA: Diagnosis not present

## 2019-04-09 DIAGNOSIS — M2450 Contracture, unspecified joint: Secondary | ICD-10-CM | POA: Diagnosis not present

## 2019-04-09 DIAGNOSIS — R293 Abnormal posture: Secondary | ICD-10-CM | POA: Diagnosis not present

## 2019-04-09 DIAGNOSIS — J9690 Respiratory failure, unspecified, unspecified whether with hypoxia or hypercapnia: Secondary | ICD-10-CM | POA: Diagnosis not present

## 2019-04-09 DIAGNOSIS — J9601 Acute respiratory failure with hypoxia: Secondary | ICD-10-CM | POA: Diagnosis not present

## 2019-04-09 DIAGNOSIS — M6281 Muscle weakness (generalized): Secondary | ICD-10-CM | POA: Diagnosis not present

## 2019-04-09 DIAGNOSIS — R1319 Other dysphagia: Secondary | ICD-10-CM | POA: Diagnosis not present

## 2019-04-09 DIAGNOSIS — R569 Unspecified convulsions: Secondary | ICD-10-CM | POA: Diagnosis not present

## 2019-04-09 DIAGNOSIS — M24562 Contracture, left knee: Secondary | ICD-10-CM | POA: Diagnosis not present

## 2019-04-09 DIAGNOSIS — I160 Hypertensive urgency: Secondary | ICD-10-CM | POA: Diagnosis not present

## 2019-04-09 DIAGNOSIS — M79661 Pain in right lower leg: Secondary | ICD-10-CM | POA: Diagnosis not present

## 2019-04-09 DIAGNOSIS — M79662 Pain in left lower leg: Secondary | ICD-10-CM | POA: Diagnosis not present

## 2019-04-09 DIAGNOSIS — R1312 Dysphagia, oropharyngeal phase: Secondary | ICD-10-CM | POA: Diagnosis not present

## 2019-04-09 DIAGNOSIS — R2681 Unsteadiness on feet: Secondary | ICD-10-CM | POA: Diagnosis not present

## 2019-04-09 DIAGNOSIS — M24561 Contracture, right knee: Secondary | ICD-10-CM | POA: Diagnosis not present

## 2019-04-09 DIAGNOSIS — R278 Other lack of coordination: Secondary | ICD-10-CM | POA: Diagnosis not present

## 2019-04-09 DIAGNOSIS — M24571 Contracture, right ankle: Secondary | ICD-10-CM | POA: Diagnosis not present

## 2019-04-09 DIAGNOSIS — R2689 Other abnormalities of gait and mobility: Secondary | ICD-10-CM | POA: Diagnosis not present

## 2019-04-10 DIAGNOSIS — M6281 Muscle weakness (generalized): Secondary | ICD-10-CM | POA: Diagnosis not present

## 2019-04-10 DIAGNOSIS — Z9181 History of falling: Secondary | ICD-10-CM | POA: Diagnosis not present

## 2019-04-10 DIAGNOSIS — M2450 Contracture, unspecified joint: Secondary | ICD-10-CM | POA: Diagnosis not present

## 2019-04-10 DIAGNOSIS — R1319 Other dysphagia: Secondary | ICD-10-CM | POA: Diagnosis not present

## 2019-04-10 DIAGNOSIS — R278 Other lack of coordination: Secondary | ICD-10-CM | POA: Diagnosis not present

## 2019-04-10 DIAGNOSIS — J9690 Respiratory failure, unspecified, unspecified whether with hypoxia or hypercapnia: Secondary | ICD-10-CM | POA: Diagnosis not present

## 2019-04-10 DIAGNOSIS — M79661 Pain in right lower leg: Secondary | ICD-10-CM | POA: Diagnosis not present

## 2019-04-10 DIAGNOSIS — R2689 Other abnormalities of gait and mobility: Secondary | ICD-10-CM | POA: Diagnosis not present

## 2019-04-10 DIAGNOSIS — R1312 Dysphagia, oropharyngeal phase: Secondary | ICD-10-CM | POA: Diagnosis not present

## 2019-04-10 DIAGNOSIS — R293 Abnormal posture: Secondary | ICD-10-CM | POA: Diagnosis not present

## 2019-04-10 DIAGNOSIS — R2681 Unsteadiness on feet: Secondary | ICD-10-CM | POA: Diagnosis not present

## 2019-04-10 DIAGNOSIS — M79662 Pain in left lower leg: Secondary | ICD-10-CM | POA: Diagnosis not present

## 2019-04-10 DIAGNOSIS — M24571 Contracture, right ankle: Secondary | ICD-10-CM | POA: Diagnosis not present

## 2019-04-10 DIAGNOSIS — M24561 Contracture, right knee: Secondary | ICD-10-CM | POA: Diagnosis not present

## 2019-04-10 DIAGNOSIS — J9601 Acute respiratory failure with hypoxia: Secondary | ICD-10-CM | POA: Diagnosis not present

## 2019-04-10 DIAGNOSIS — L89153 Pressure ulcer of sacral region, stage 3: Secondary | ICD-10-CM | POA: Diagnosis not present

## 2019-04-11 DIAGNOSIS — J9601 Acute respiratory failure with hypoxia: Secondary | ICD-10-CM | POA: Diagnosis not present

## 2019-04-11 DIAGNOSIS — M24561 Contracture, right knee: Secondary | ICD-10-CM | POA: Diagnosis not present

## 2019-04-11 DIAGNOSIS — R293 Abnormal posture: Secondary | ICD-10-CM | POA: Diagnosis not present

## 2019-04-11 DIAGNOSIS — M79661 Pain in right lower leg: Secondary | ICD-10-CM | POA: Diagnosis not present

## 2019-04-11 DIAGNOSIS — J9690 Respiratory failure, unspecified, unspecified whether with hypoxia or hypercapnia: Secondary | ICD-10-CM | POA: Diagnosis not present

## 2019-04-11 DIAGNOSIS — R1312 Dysphagia, oropharyngeal phase: Secondary | ICD-10-CM | POA: Diagnosis not present

## 2019-04-11 DIAGNOSIS — M24571 Contracture, right ankle: Secondary | ICD-10-CM | POA: Diagnosis not present

## 2019-04-11 DIAGNOSIS — R1319 Other dysphagia: Secondary | ICD-10-CM | POA: Diagnosis not present

## 2019-04-11 DIAGNOSIS — Z9181 History of falling: Secondary | ICD-10-CM | POA: Diagnosis not present

## 2019-04-11 DIAGNOSIS — M79662 Pain in left lower leg: Secondary | ICD-10-CM | POA: Diagnosis not present

## 2019-04-11 DIAGNOSIS — R2681 Unsteadiness on feet: Secondary | ICD-10-CM | POA: Diagnosis not present

## 2019-04-11 DIAGNOSIS — R278 Other lack of coordination: Secondary | ICD-10-CM | POA: Diagnosis not present

## 2019-04-11 DIAGNOSIS — M2450 Contracture, unspecified joint: Secondary | ICD-10-CM | POA: Diagnosis not present

## 2019-04-11 DIAGNOSIS — M6281 Muscle weakness (generalized): Secondary | ICD-10-CM | POA: Diagnosis not present

## 2019-04-11 DIAGNOSIS — R2689 Other abnormalities of gait and mobility: Secondary | ICD-10-CM | POA: Diagnosis not present

## 2019-04-12 DIAGNOSIS — R1312 Dysphagia, oropharyngeal phase: Secondary | ICD-10-CM | POA: Diagnosis not present

## 2019-04-12 DIAGNOSIS — R278 Other lack of coordination: Secondary | ICD-10-CM | POA: Diagnosis not present

## 2019-04-12 DIAGNOSIS — R2681 Unsteadiness on feet: Secondary | ICD-10-CM | POA: Diagnosis not present

## 2019-04-12 DIAGNOSIS — Z9181 History of falling: Secondary | ICD-10-CM | POA: Diagnosis not present

## 2019-04-12 DIAGNOSIS — M79662 Pain in left lower leg: Secondary | ICD-10-CM | POA: Diagnosis not present

## 2019-04-12 DIAGNOSIS — M24561 Contracture, right knee: Secondary | ICD-10-CM | POA: Diagnosis not present

## 2019-04-12 DIAGNOSIS — M24571 Contracture, right ankle: Secondary | ICD-10-CM | POA: Diagnosis not present

## 2019-04-12 DIAGNOSIS — J9690 Respiratory failure, unspecified, unspecified whether with hypoxia or hypercapnia: Secondary | ICD-10-CM | POA: Diagnosis not present

## 2019-04-12 DIAGNOSIS — R2689 Other abnormalities of gait and mobility: Secondary | ICD-10-CM | POA: Diagnosis not present

## 2019-04-12 DIAGNOSIS — J9601 Acute respiratory failure with hypoxia: Secondary | ICD-10-CM | POA: Diagnosis not present

## 2019-04-12 DIAGNOSIS — M2450 Contracture, unspecified joint: Secondary | ICD-10-CM | POA: Diagnosis not present

## 2019-04-12 DIAGNOSIS — M79661 Pain in right lower leg: Secondary | ICD-10-CM | POA: Diagnosis not present

## 2019-04-12 DIAGNOSIS — R293 Abnormal posture: Secondary | ICD-10-CM | POA: Diagnosis not present

## 2019-04-12 DIAGNOSIS — R1319 Other dysphagia: Secondary | ICD-10-CM | POA: Diagnosis not present

## 2019-04-12 DIAGNOSIS — M6281 Muscle weakness (generalized): Secondary | ICD-10-CM | POA: Diagnosis not present

## 2019-04-13 DIAGNOSIS — J9601 Acute respiratory failure with hypoxia: Secondary | ICD-10-CM | POA: Diagnosis not present

## 2019-04-13 DIAGNOSIS — R2689 Other abnormalities of gait and mobility: Secondary | ICD-10-CM | POA: Diagnosis not present

## 2019-04-13 DIAGNOSIS — R1319 Other dysphagia: Secondary | ICD-10-CM | POA: Diagnosis not present

## 2019-04-13 DIAGNOSIS — M24561 Contracture, right knee: Secondary | ICD-10-CM | POA: Diagnosis not present

## 2019-04-13 DIAGNOSIS — M79661 Pain in right lower leg: Secondary | ICD-10-CM | POA: Diagnosis not present

## 2019-04-13 DIAGNOSIS — R1312 Dysphagia, oropharyngeal phase: Secondary | ICD-10-CM | POA: Diagnosis not present

## 2019-04-13 DIAGNOSIS — R293 Abnormal posture: Secondary | ICD-10-CM | POA: Diagnosis not present

## 2019-04-13 DIAGNOSIS — M24571 Contracture, right ankle: Secondary | ICD-10-CM | POA: Diagnosis not present

## 2019-04-13 DIAGNOSIS — J9690 Respiratory failure, unspecified, unspecified whether with hypoxia or hypercapnia: Secondary | ICD-10-CM | POA: Diagnosis not present

## 2019-04-13 DIAGNOSIS — R278 Other lack of coordination: Secondary | ICD-10-CM | POA: Diagnosis not present

## 2019-04-13 DIAGNOSIS — M79662 Pain in left lower leg: Secondary | ICD-10-CM | POA: Diagnosis not present

## 2019-04-13 DIAGNOSIS — M2450 Contracture, unspecified joint: Secondary | ICD-10-CM | POA: Diagnosis not present

## 2019-04-13 DIAGNOSIS — M6281 Muscle weakness (generalized): Secondary | ICD-10-CM | POA: Diagnosis not present

## 2019-04-13 DIAGNOSIS — Z9181 History of falling: Secondary | ICD-10-CM | POA: Diagnosis not present

## 2019-04-13 DIAGNOSIS — R2681 Unsteadiness on feet: Secondary | ICD-10-CM | POA: Diagnosis not present

## 2019-04-14 DIAGNOSIS — M2450 Contracture, unspecified joint: Secondary | ICD-10-CM | POA: Diagnosis not present

## 2019-04-14 DIAGNOSIS — R278 Other lack of coordination: Secondary | ICD-10-CM | POA: Diagnosis not present

## 2019-04-14 DIAGNOSIS — R1319 Other dysphagia: Secondary | ICD-10-CM | POA: Diagnosis not present

## 2019-04-14 DIAGNOSIS — M24571 Contracture, right ankle: Secondary | ICD-10-CM | POA: Diagnosis not present

## 2019-04-14 DIAGNOSIS — Z9181 History of falling: Secondary | ICD-10-CM | POA: Diagnosis not present

## 2019-04-14 DIAGNOSIS — J9601 Acute respiratory failure with hypoxia: Secondary | ICD-10-CM | POA: Diagnosis not present

## 2019-04-14 DIAGNOSIS — R2681 Unsteadiness on feet: Secondary | ICD-10-CM | POA: Diagnosis not present

## 2019-04-14 DIAGNOSIS — M79662 Pain in left lower leg: Secondary | ICD-10-CM | POA: Diagnosis not present

## 2019-04-14 DIAGNOSIS — J9690 Respiratory failure, unspecified, unspecified whether with hypoxia or hypercapnia: Secondary | ICD-10-CM | POA: Diagnosis not present

## 2019-04-14 DIAGNOSIS — M6281 Muscle weakness (generalized): Secondary | ICD-10-CM | POA: Diagnosis not present

## 2019-04-14 DIAGNOSIS — R1312 Dysphagia, oropharyngeal phase: Secondary | ICD-10-CM | POA: Diagnosis not present

## 2019-04-14 DIAGNOSIS — M24561 Contracture, right knee: Secondary | ICD-10-CM | POA: Diagnosis not present

## 2019-04-14 DIAGNOSIS — M79661 Pain in right lower leg: Secondary | ICD-10-CM | POA: Diagnosis not present

## 2019-04-14 DIAGNOSIS — R2689 Other abnormalities of gait and mobility: Secondary | ICD-10-CM | POA: Diagnosis not present

## 2019-04-14 DIAGNOSIS — R293 Abnormal posture: Secondary | ICD-10-CM | POA: Diagnosis not present

## 2019-04-15 DIAGNOSIS — M6281 Muscle weakness (generalized): Secondary | ICD-10-CM | POA: Diagnosis not present

## 2019-04-15 DIAGNOSIS — R278 Other lack of coordination: Secondary | ICD-10-CM | POA: Diagnosis not present

## 2019-04-15 DIAGNOSIS — R1312 Dysphagia, oropharyngeal phase: Secondary | ICD-10-CM | POA: Diagnosis not present

## 2019-04-15 DIAGNOSIS — M24561 Contracture, right knee: Secondary | ICD-10-CM | POA: Diagnosis not present

## 2019-04-15 DIAGNOSIS — R293 Abnormal posture: Secondary | ICD-10-CM | POA: Diagnosis not present

## 2019-04-15 DIAGNOSIS — Z9181 History of falling: Secondary | ICD-10-CM | POA: Diagnosis not present

## 2019-04-15 DIAGNOSIS — R2689 Other abnormalities of gait and mobility: Secondary | ICD-10-CM | POA: Diagnosis not present

## 2019-04-15 DIAGNOSIS — M79661 Pain in right lower leg: Secondary | ICD-10-CM | POA: Diagnosis not present

## 2019-04-15 DIAGNOSIS — M79662 Pain in left lower leg: Secondary | ICD-10-CM | POA: Diagnosis not present

## 2019-04-15 DIAGNOSIS — R1319 Other dysphagia: Secondary | ICD-10-CM | POA: Diagnosis not present

## 2019-04-15 DIAGNOSIS — J9690 Respiratory failure, unspecified, unspecified whether with hypoxia or hypercapnia: Secondary | ICD-10-CM | POA: Diagnosis not present

## 2019-04-15 DIAGNOSIS — M24571 Contracture, right ankle: Secondary | ICD-10-CM | POA: Diagnosis not present

## 2019-04-15 DIAGNOSIS — R2681 Unsteadiness on feet: Secondary | ICD-10-CM | POA: Diagnosis not present

## 2019-04-15 DIAGNOSIS — M2450 Contracture, unspecified joint: Secondary | ICD-10-CM | POA: Diagnosis not present

## 2019-04-15 DIAGNOSIS — J9601 Acute respiratory failure with hypoxia: Secondary | ICD-10-CM | POA: Diagnosis not present

## 2019-04-16 DIAGNOSIS — J9690 Respiratory failure, unspecified, unspecified whether with hypoxia or hypercapnia: Secondary | ICD-10-CM | POA: Diagnosis not present

## 2019-04-16 DIAGNOSIS — L89153 Pressure ulcer of sacral region, stage 3: Secondary | ICD-10-CM | POA: Diagnosis not present

## 2019-04-16 DIAGNOSIS — Z9181 History of falling: Secondary | ICD-10-CM | POA: Diagnosis not present

## 2019-04-16 DIAGNOSIS — M2450 Contracture, unspecified joint: Secondary | ICD-10-CM | POA: Diagnosis not present

## 2019-04-16 DIAGNOSIS — M6281 Muscle weakness (generalized): Secondary | ICD-10-CM | POA: Diagnosis not present

## 2019-04-16 DIAGNOSIS — M79662 Pain in left lower leg: Secondary | ICD-10-CM | POA: Diagnosis not present

## 2019-04-16 DIAGNOSIS — M79661 Pain in right lower leg: Secondary | ICD-10-CM | POA: Diagnosis not present

## 2019-04-16 DIAGNOSIS — I10 Essential (primary) hypertension: Secondary | ICD-10-CM | POA: Diagnosis not present

## 2019-04-16 DIAGNOSIS — J9601 Acute respiratory failure with hypoxia: Secondary | ICD-10-CM | POA: Diagnosis not present

## 2019-04-16 DIAGNOSIS — R278 Other lack of coordination: Secondary | ICD-10-CM | POA: Diagnosis not present

## 2019-04-16 DIAGNOSIS — M24561 Contracture, right knee: Secondary | ICD-10-CM | POA: Diagnosis not present

## 2019-04-16 DIAGNOSIS — R1319 Other dysphagia: Secondary | ICD-10-CM | POA: Diagnosis not present

## 2019-04-16 DIAGNOSIS — R293 Abnormal posture: Secondary | ICD-10-CM | POA: Diagnosis not present

## 2019-04-16 DIAGNOSIS — R2681 Unsteadiness on feet: Secondary | ICD-10-CM | POA: Diagnosis not present

## 2019-04-16 DIAGNOSIS — R6889 Other general symptoms and signs: Secondary | ICD-10-CM | POA: Diagnosis not present

## 2019-04-16 DIAGNOSIS — R1312 Dysphagia, oropharyngeal phase: Secondary | ICD-10-CM | POA: Diagnosis not present

## 2019-04-16 DIAGNOSIS — M24571 Contracture, right ankle: Secondary | ICD-10-CM | POA: Diagnosis not present

## 2019-04-16 DIAGNOSIS — J188 Other pneumonia, unspecified organism: Secondary | ICD-10-CM | POA: Diagnosis not present

## 2019-04-16 DIAGNOSIS — R2689 Other abnormalities of gait and mobility: Secondary | ICD-10-CM | POA: Diagnosis not present

## 2019-04-17 DIAGNOSIS — M24571 Contracture, right ankle: Secondary | ICD-10-CM | POA: Diagnosis not present

## 2019-04-17 DIAGNOSIS — M79661 Pain in right lower leg: Secondary | ICD-10-CM | POA: Diagnosis not present

## 2019-04-17 DIAGNOSIS — R2689 Other abnormalities of gait and mobility: Secondary | ICD-10-CM | POA: Diagnosis not present

## 2019-04-17 DIAGNOSIS — N390 Urinary tract infection, site not specified: Secondary | ICD-10-CM | POA: Diagnosis not present

## 2019-04-17 DIAGNOSIS — R293 Abnormal posture: Secondary | ICD-10-CM | POA: Diagnosis not present

## 2019-04-17 DIAGNOSIS — M2450 Contracture, unspecified joint: Secondary | ICD-10-CM | POA: Diagnosis not present

## 2019-04-17 DIAGNOSIS — R392 Extrarenal uremia: Secondary | ICD-10-CM | POA: Diagnosis not present

## 2019-04-17 DIAGNOSIS — Z9181 History of falling: Secondary | ICD-10-CM | POA: Diagnosis not present

## 2019-04-17 DIAGNOSIS — J9601 Acute respiratory failure with hypoxia: Secondary | ICD-10-CM | POA: Diagnosis not present

## 2019-04-17 DIAGNOSIS — M24561 Contracture, right knee: Secondary | ICD-10-CM | POA: Diagnosis not present

## 2019-04-17 DIAGNOSIS — R1319 Other dysphagia: Secondary | ICD-10-CM | POA: Diagnosis not present

## 2019-04-17 DIAGNOSIS — M79662 Pain in left lower leg: Secondary | ICD-10-CM | POA: Diagnosis not present

## 2019-04-17 DIAGNOSIS — Z79899 Other long term (current) drug therapy: Secondary | ICD-10-CM | POA: Diagnosis not present

## 2019-04-17 DIAGNOSIS — R278 Other lack of coordination: Secondary | ICD-10-CM | POA: Diagnosis not present

## 2019-04-17 DIAGNOSIS — R1312 Dysphagia, oropharyngeal phase: Secondary | ICD-10-CM | POA: Diagnosis not present

## 2019-04-17 DIAGNOSIS — M6281 Muscle weakness (generalized): Secondary | ICD-10-CM | POA: Diagnosis not present

## 2019-04-17 DIAGNOSIS — R2681 Unsteadiness on feet: Secondary | ICD-10-CM | POA: Diagnosis not present

## 2019-04-17 DIAGNOSIS — J9690 Respiratory failure, unspecified, unspecified whether with hypoxia or hypercapnia: Secondary | ICD-10-CM | POA: Diagnosis not present

## 2019-04-18 DIAGNOSIS — R2681 Unsteadiness on feet: Secondary | ICD-10-CM | POA: Diagnosis not present

## 2019-04-18 DIAGNOSIS — M24571 Contracture, right ankle: Secondary | ICD-10-CM | POA: Diagnosis not present

## 2019-04-18 DIAGNOSIS — Z9181 History of falling: Secondary | ICD-10-CM | POA: Diagnosis not present

## 2019-04-18 DIAGNOSIS — J9690 Respiratory failure, unspecified, unspecified whether with hypoxia or hypercapnia: Secondary | ICD-10-CM | POA: Diagnosis not present

## 2019-04-18 DIAGNOSIS — M79661 Pain in right lower leg: Secondary | ICD-10-CM | POA: Diagnosis not present

## 2019-04-18 DIAGNOSIS — J9601 Acute respiratory failure with hypoxia: Secondary | ICD-10-CM | POA: Diagnosis not present

## 2019-04-18 DIAGNOSIS — M24561 Contracture, right knee: Secondary | ICD-10-CM | POA: Diagnosis not present

## 2019-04-18 DIAGNOSIS — R293 Abnormal posture: Secondary | ICD-10-CM | POA: Diagnosis not present

## 2019-04-18 DIAGNOSIS — R1319 Other dysphagia: Secondary | ICD-10-CM | POA: Diagnosis not present

## 2019-04-18 DIAGNOSIS — R1312 Dysphagia, oropharyngeal phase: Secondary | ICD-10-CM | POA: Diagnosis not present

## 2019-04-18 DIAGNOSIS — M6281 Muscle weakness (generalized): Secondary | ICD-10-CM | POA: Diagnosis not present

## 2019-04-18 DIAGNOSIS — M79662 Pain in left lower leg: Secondary | ICD-10-CM | POA: Diagnosis not present

## 2019-04-18 DIAGNOSIS — M2450 Contracture, unspecified joint: Secondary | ICD-10-CM | POA: Diagnosis not present

## 2019-04-18 DIAGNOSIS — R2689 Other abnormalities of gait and mobility: Secondary | ICD-10-CM | POA: Diagnosis not present

## 2019-04-18 DIAGNOSIS — R278 Other lack of coordination: Secondary | ICD-10-CM | POA: Diagnosis not present

## 2019-04-19 DIAGNOSIS — R2681 Unsteadiness on feet: Secondary | ICD-10-CM | POA: Diagnosis not present

## 2019-04-19 DIAGNOSIS — R1319 Other dysphagia: Secondary | ICD-10-CM | POA: Diagnosis not present

## 2019-04-19 DIAGNOSIS — Z9181 History of falling: Secondary | ICD-10-CM | POA: Diagnosis not present

## 2019-04-19 DIAGNOSIS — J9601 Acute respiratory failure with hypoxia: Secondary | ICD-10-CM | POA: Diagnosis not present

## 2019-04-19 DIAGNOSIS — M79662 Pain in left lower leg: Secondary | ICD-10-CM | POA: Diagnosis not present

## 2019-04-19 DIAGNOSIS — M79661 Pain in right lower leg: Secondary | ICD-10-CM | POA: Diagnosis not present

## 2019-04-19 DIAGNOSIS — R278 Other lack of coordination: Secondary | ICD-10-CM | POA: Diagnosis not present

## 2019-04-19 DIAGNOSIS — M6281 Muscle weakness (generalized): Secondary | ICD-10-CM | POA: Diagnosis not present

## 2019-04-19 DIAGNOSIS — R2689 Other abnormalities of gait and mobility: Secondary | ICD-10-CM | POA: Diagnosis not present

## 2019-04-19 DIAGNOSIS — J9690 Respiratory failure, unspecified, unspecified whether with hypoxia or hypercapnia: Secondary | ICD-10-CM | POA: Diagnosis not present

## 2019-04-19 DIAGNOSIS — R1312 Dysphagia, oropharyngeal phase: Secondary | ICD-10-CM | POA: Diagnosis not present

## 2019-04-19 DIAGNOSIS — M2450 Contracture, unspecified joint: Secondary | ICD-10-CM | POA: Diagnosis not present

## 2019-04-19 DIAGNOSIS — R293 Abnormal posture: Secondary | ICD-10-CM | POA: Diagnosis not present

## 2019-04-19 DIAGNOSIS — M24561 Contracture, right knee: Secondary | ICD-10-CM | POA: Diagnosis not present

## 2019-04-19 DIAGNOSIS — M24571 Contracture, right ankle: Secondary | ICD-10-CM | POA: Diagnosis not present

## 2019-04-20 DIAGNOSIS — M24571 Contracture, right ankle: Secondary | ICD-10-CM | POA: Diagnosis not present

## 2019-04-20 DIAGNOSIS — M79662 Pain in left lower leg: Secondary | ICD-10-CM | POA: Diagnosis not present

## 2019-04-20 DIAGNOSIS — R2681 Unsteadiness on feet: Secondary | ICD-10-CM | POA: Diagnosis not present

## 2019-04-20 DIAGNOSIS — J9690 Respiratory failure, unspecified, unspecified whether with hypoxia or hypercapnia: Secondary | ICD-10-CM | POA: Diagnosis not present

## 2019-04-20 DIAGNOSIS — J9601 Acute respiratory failure with hypoxia: Secondary | ICD-10-CM | POA: Diagnosis not present

## 2019-04-20 DIAGNOSIS — M79661 Pain in right lower leg: Secondary | ICD-10-CM | POA: Diagnosis not present

## 2019-04-20 DIAGNOSIS — Z9181 History of falling: Secondary | ICD-10-CM | POA: Diagnosis not present

## 2019-04-20 DIAGNOSIS — M2450 Contracture, unspecified joint: Secondary | ICD-10-CM | POA: Diagnosis not present

## 2019-04-20 DIAGNOSIS — R278 Other lack of coordination: Secondary | ICD-10-CM | POA: Diagnosis not present

## 2019-04-20 DIAGNOSIS — R1319 Other dysphagia: Secondary | ICD-10-CM | POA: Diagnosis not present

## 2019-04-20 DIAGNOSIS — R293 Abnormal posture: Secondary | ICD-10-CM | POA: Diagnosis not present

## 2019-04-20 DIAGNOSIS — M6281 Muscle weakness (generalized): Secondary | ICD-10-CM | POA: Diagnosis not present

## 2019-04-20 DIAGNOSIS — M24561 Contracture, right knee: Secondary | ICD-10-CM | POA: Diagnosis not present

## 2019-04-20 DIAGNOSIS — R1312 Dysphagia, oropharyngeal phase: Secondary | ICD-10-CM | POA: Diagnosis not present

## 2019-04-20 DIAGNOSIS — R2689 Other abnormalities of gait and mobility: Secondary | ICD-10-CM | POA: Diagnosis not present

## 2019-04-21 DIAGNOSIS — M2450 Contracture, unspecified joint: Secondary | ICD-10-CM | POA: Diagnosis not present

## 2019-04-21 DIAGNOSIS — Z9114 Patient's other noncompliance with medication regimen: Secondary | ICD-10-CM | POA: Diagnosis not present

## 2019-04-21 DIAGNOSIS — J9690 Respiratory failure, unspecified, unspecified whether with hypoxia or hypercapnia: Secondary | ICD-10-CM | POA: Diagnosis not present

## 2019-04-21 DIAGNOSIS — R2681 Unsteadiness on feet: Secondary | ICD-10-CM | POA: Diagnosis not present

## 2019-04-21 DIAGNOSIS — M6281 Muscle weakness (generalized): Secondary | ICD-10-CM | POA: Diagnosis not present

## 2019-04-21 DIAGNOSIS — M79662 Pain in left lower leg: Secondary | ICD-10-CM | POA: Diagnosis not present

## 2019-04-21 DIAGNOSIS — M24561 Contracture, right knee: Secondary | ICD-10-CM | POA: Diagnosis not present

## 2019-04-21 DIAGNOSIS — Z9181 History of falling: Secondary | ICD-10-CM | POA: Diagnosis not present

## 2019-04-21 DIAGNOSIS — J9601 Acute respiratory failure with hypoxia: Secondary | ICD-10-CM | POA: Diagnosis not present

## 2019-04-21 DIAGNOSIS — R1319 Other dysphagia: Secondary | ICD-10-CM | POA: Diagnosis not present

## 2019-04-21 DIAGNOSIS — R2689 Other abnormalities of gait and mobility: Secondary | ICD-10-CM | POA: Diagnosis not present

## 2019-04-21 DIAGNOSIS — R278 Other lack of coordination: Secondary | ICD-10-CM | POA: Diagnosis not present

## 2019-04-21 DIAGNOSIS — M24571 Contracture, right ankle: Secondary | ICD-10-CM | POA: Diagnosis not present

## 2019-04-21 DIAGNOSIS — R1312 Dysphagia, oropharyngeal phase: Secondary | ICD-10-CM | POA: Diagnosis not present

## 2019-04-21 DIAGNOSIS — M79661 Pain in right lower leg: Secondary | ICD-10-CM | POA: Diagnosis not present

## 2019-04-21 DIAGNOSIS — R293 Abnormal posture: Secondary | ICD-10-CM | POA: Diagnosis not present

## 2019-04-21 DIAGNOSIS — J188 Other pneumonia, unspecified organism: Secondary | ICD-10-CM | POA: Diagnosis not present

## 2019-04-22 DIAGNOSIS — M24571 Contracture, right ankle: Secondary | ICD-10-CM | POA: Diagnosis not present

## 2019-04-22 DIAGNOSIS — J9690 Respiratory failure, unspecified, unspecified whether with hypoxia or hypercapnia: Secondary | ICD-10-CM | POA: Diagnosis not present

## 2019-04-22 DIAGNOSIS — R1319 Other dysphagia: Secondary | ICD-10-CM | POA: Diagnosis not present

## 2019-04-22 DIAGNOSIS — M6281 Muscle weakness (generalized): Secondary | ICD-10-CM | POA: Diagnosis not present

## 2019-04-22 DIAGNOSIS — R293 Abnormal posture: Secondary | ICD-10-CM | POA: Diagnosis not present

## 2019-04-22 DIAGNOSIS — M24561 Contracture, right knee: Secondary | ICD-10-CM | POA: Diagnosis not present

## 2019-04-22 DIAGNOSIS — Z9181 History of falling: Secondary | ICD-10-CM | POA: Diagnosis not present

## 2019-04-22 DIAGNOSIS — R278 Other lack of coordination: Secondary | ICD-10-CM | POA: Diagnosis not present

## 2019-04-22 DIAGNOSIS — M79661 Pain in right lower leg: Secondary | ICD-10-CM | POA: Diagnosis not present

## 2019-04-22 DIAGNOSIS — R2689 Other abnormalities of gait and mobility: Secondary | ICD-10-CM | POA: Diagnosis not present

## 2019-04-22 DIAGNOSIS — M79662 Pain in left lower leg: Secondary | ICD-10-CM | POA: Diagnosis not present

## 2019-04-22 DIAGNOSIS — M2450 Contracture, unspecified joint: Secondary | ICD-10-CM | POA: Diagnosis not present

## 2019-04-22 DIAGNOSIS — R2681 Unsteadiness on feet: Secondary | ICD-10-CM | POA: Diagnosis not present

## 2019-04-22 DIAGNOSIS — J9601 Acute respiratory failure with hypoxia: Secondary | ICD-10-CM | POA: Diagnosis not present

## 2019-04-22 DIAGNOSIS — R1312 Dysphagia, oropharyngeal phase: Secondary | ICD-10-CM | POA: Diagnosis not present

## 2019-04-22 DIAGNOSIS — J9600 Acute respiratory failure, unspecified whether with hypoxia or hypercapnia: Secondary | ICD-10-CM | POA: Diagnosis not present

## 2019-04-23 DIAGNOSIS — M79662 Pain in left lower leg: Secondary | ICD-10-CM | POA: Diagnosis not present

## 2019-04-23 DIAGNOSIS — M24571 Contracture, right ankle: Secondary | ICD-10-CM | POA: Diagnosis not present

## 2019-04-23 DIAGNOSIS — R1312 Dysphagia, oropharyngeal phase: Secondary | ICD-10-CM | POA: Diagnosis not present

## 2019-04-23 DIAGNOSIS — M2450 Contracture, unspecified joint: Secondary | ICD-10-CM | POA: Diagnosis not present

## 2019-04-23 DIAGNOSIS — R2689 Other abnormalities of gait and mobility: Secondary | ICD-10-CM | POA: Diagnosis not present

## 2019-04-23 DIAGNOSIS — M24561 Contracture, right knee: Secondary | ICD-10-CM | POA: Diagnosis not present

## 2019-04-23 DIAGNOSIS — R2681 Unsteadiness on feet: Secondary | ICD-10-CM | POA: Diagnosis not present

## 2019-04-23 DIAGNOSIS — M79661 Pain in right lower leg: Secondary | ICD-10-CM | POA: Diagnosis not present

## 2019-04-23 DIAGNOSIS — M6281 Muscle weakness (generalized): Secondary | ICD-10-CM | POA: Diagnosis not present

## 2019-04-23 DIAGNOSIS — Z9181 History of falling: Secondary | ICD-10-CM | POA: Diagnosis not present

## 2019-04-23 DIAGNOSIS — J9601 Acute respiratory failure with hypoxia: Secondary | ICD-10-CM | POA: Diagnosis not present

## 2019-04-23 DIAGNOSIS — J9690 Respiratory failure, unspecified, unspecified whether with hypoxia or hypercapnia: Secondary | ICD-10-CM | POA: Diagnosis not present

## 2019-04-23 DIAGNOSIS — R1319 Other dysphagia: Secondary | ICD-10-CM | POA: Diagnosis not present

## 2019-04-23 DIAGNOSIS — R278 Other lack of coordination: Secondary | ICD-10-CM | POA: Diagnosis not present

## 2019-04-23 DIAGNOSIS — R293 Abnormal posture: Secondary | ICD-10-CM | POA: Diagnosis not present

## 2019-04-24 DIAGNOSIS — R1319 Other dysphagia: Secondary | ICD-10-CM | POA: Diagnosis not present

## 2019-04-24 DIAGNOSIS — R2689 Other abnormalities of gait and mobility: Secondary | ICD-10-CM | POA: Diagnosis not present

## 2019-04-24 DIAGNOSIS — M24561 Contracture, right knee: Secondary | ICD-10-CM | POA: Diagnosis not present

## 2019-04-24 DIAGNOSIS — J9601 Acute respiratory failure with hypoxia: Secondary | ICD-10-CM | POA: Diagnosis not present

## 2019-04-24 DIAGNOSIS — J9690 Respiratory failure, unspecified, unspecified whether with hypoxia or hypercapnia: Secondary | ICD-10-CM | POA: Diagnosis not present

## 2019-04-24 DIAGNOSIS — M6281 Muscle weakness (generalized): Secondary | ICD-10-CM | POA: Diagnosis not present

## 2019-04-24 DIAGNOSIS — M79662 Pain in left lower leg: Secondary | ICD-10-CM | POA: Diagnosis not present

## 2019-04-24 DIAGNOSIS — Z9181 History of falling: Secondary | ICD-10-CM | POA: Diagnosis not present

## 2019-04-24 DIAGNOSIS — R278 Other lack of coordination: Secondary | ICD-10-CM | POA: Diagnosis not present

## 2019-04-24 DIAGNOSIS — R1312 Dysphagia, oropharyngeal phase: Secondary | ICD-10-CM | POA: Diagnosis not present

## 2019-04-24 DIAGNOSIS — M2450 Contracture, unspecified joint: Secondary | ICD-10-CM | POA: Diagnosis not present

## 2019-04-24 DIAGNOSIS — M24571 Contracture, right ankle: Secondary | ICD-10-CM | POA: Diagnosis not present

## 2019-04-24 DIAGNOSIS — R293 Abnormal posture: Secondary | ICD-10-CM | POA: Diagnosis not present

## 2019-04-24 DIAGNOSIS — M79661 Pain in right lower leg: Secondary | ICD-10-CM | POA: Diagnosis not present

## 2019-04-24 DIAGNOSIS — R2681 Unsteadiness on feet: Secondary | ICD-10-CM | POA: Diagnosis not present

## 2019-04-25 DIAGNOSIS — Z9181 History of falling: Secondary | ICD-10-CM | POA: Diagnosis not present

## 2019-04-25 DIAGNOSIS — R1319 Other dysphagia: Secondary | ICD-10-CM | POA: Diagnosis not present

## 2019-04-25 DIAGNOSIS — M2450 Contracture, unspecified joint: Secondary | ICD-10-CM | POA: Diagnosis not present

## 2019-04-25 DIAGNOSIS — R278 Other lack of coordination: Secondary | ICD-10-CM | POA: Diagnosis not present

## 2019-04-25 DIAGNOSIS — R1312 Dysphagia, oropharyngeal phase: Secondary | ICD-10-CM | POA: Diagnosis not present

## 2019-04-25 DIAGNOSIS — R293 Abnormal posture: Secondary | ICD-10-CM | POA: Diagnosis not present

## 2019-04-25 DIAGNOSIS — J9690 Respiratory failure, unspecified, unspecified whether with hypoxia or hypercapnia: Secondary | ICD-10-CM | POA: Diagnosis not present

## 2019-04-25 DIAGNOSIS — R2689 Other abnormalities of gait and mobility: Secondary | ICD-10-CM | POA: Diagnosis not present

## 2019-04-25 DIAGNOSIS — M79662 Pain in left lower leg: Secondary | ICD-10-CM | POA: Diagnosis not present

## 2019-04-25 DIAGNOSIS — M24561 Contracture, right knee: Secondary | ICD-10-CM | POA: Diagnosis not present

## 2019-04-25 DIAGNOSIS — J9601 Acute respiratory failure with hypoxia: Secondary | ICD-10-CM | POA: Diagnosis not present

## 2019-04-25 DIAGNOSIS — M6281 Muscle weakness (generalized): Secondary | ICD-10-CM | POA: Diagnosis not present

## 2019-04-25 DIAGNOSIS — M79661 Pain in right lower leg: Secondary | ICD-10-CM | POA: Diagnosis not present

## 2019-04-25 DIAGNOSIS — M24571 Contracture, right ankle: Secondary | ICD-10-CM | POA: Diagnosis not present

## 2019-04-25 DIAGNOSIS — R2681 Unsteadiness on feet: Secondary | ICD-10-CM | POA: Diagnosis not present

## 2019-04-26 DIAGNOSIS — R293 Abnormal posture: Secondary | ICD-10-CM | POA: Diagnosis not present

## 2019-04-26 DIAGNOSIS — Z9181 History of falling: Secondary | ICD-10-CM | POA: Diagnosis not present

## 2019-04-26 DIAGNOSIS — M24561 Contracture, right knee: Secondary | ICD-10-CM | POA: Diagnosis not present

## 2019-04-26 DIAGNOSIS — R1319 Other dysphagia: Secondary | ICD-10-CM | POA: Diagnosis not present

## 2019-04-26 DIAGNOSIS — R1312 Dysphagia, oropharyngeal phase: Secondary | ICD-10-CM | POA: Diagnosis not present

## 2019-04-26 DIAGNOSIS — J9601 Acute respiratory failure with hypoxia: Secondary | ICD-10-CM | POA: Diagnosis not present

## 2019-04-26 DIAGNOSIS — R2689 Other abnormalities of gait and mobility: Secondary | ICD-10-CM | POA: Diagnosis not present

## 2019-04-26 DIAGNOSIS — J9690 Respiratory failure, unspecified, unspecified whether with hypoxia or hypercapnia: Secondary | ICD-10-CM | POA: Diagnosis not present

## 2019-04-26 DIAGNOSIS — R2681 Unsteadiness on feet: Secondary | ICD-10-CM | POA: Diagnosis not present

## 2019-04-26 DIAGNOSIS — M6281 Muscle weakness (generalized): Secondary | ICD-10-CM | POA: Diagnosis not present

## 2019-04-26 DIAGNOSIS — M79661 Pain in right lower leg: Secondary | ICD-10-CM | POA: Diagnosis not present

## 2019-04-26 DIAGNOSIS — M24571 Contracture, right ankle: Secondary | ICD-10-CM | POA: Diagnosis not present

## 2019-04-26 DIAGNOSIS — M79662 Pain in left lower leg: Secondary | ICD-10-CM | POA: Diagnosis not present

## 2019-04-26 DIAGNOSIS — M2450 Contracture, unspecified joint: Secondary | ICD-10-CM | POA: Diagnosis not present

## 2019-04-26 DIAGNOSIS — R278 Other lack of coordination: Secondary | ICD-10-CM | POA: Diagnosis not present

## 2019-04-27 DIAGNOSIS — R1312 Dysphagia, oropharyngeal phase: Secondary | ICD-10-CM | POA: Diagnosis not present

## 2019-04-27 DIAGNOSIS — M2450 Contracture, unspecified joint: Secondary | ICD-10-CM | POA: Diagnosis not present

## 2019-04-27 DIAGNOSIS — J9690 Respiratory failure, unspecified, unspecified whether with hypoxia or hypercapnia: Secondary | ICD-10-CM | POA: Diagnosis not present

## 2019-04-27 DIAGNOSIS — M24571 Contracture, right ankle: Secondary | ICD-10-CM | POA: Diagnosis not present

## 2019-04-27 DIAGNOSIS — J9601 Acute respiratory failure with hypoxia: Secondary | ICD-10-CM | POA: Diagnosis not present

## 2019-04-27 DIAGNOSIS — R2689 Other abnormalities of gait and mobility: Secondary | ICD-10-CM | POA: Diagnosis not present

## 2019-04-27 DIAGNOSIS — R2681 Unsteadiness on feet: Secondary | ICD-10-CM | POA: Diagnosis not present

## 2019-04-27 DIAGNOSIS — M79662 Pain in left lower leg: Secondary | ICD-10-CM | POA: Diagnosis not present

## 2019-04-27 DIAGNOSIS — R293 Abnormal posture: Secondary | ICD-10-CM | POA: Diagnosis not present

## 2019-04-27 DIAGNOSIS — Z9181 History of falling: Secondary | ICD-10-CM | POA: Diagnosis not present

## 2019-04-27 DIAGNOSIS — M79661 Pain in right lower leg: Secondary | ICD-10-CM | POA: Diagnosis not present

## 2019-04-27 DIAGNOSIS — R278 Other lack of coordination: Secondary | ICD-10-CM | POA: Diagnosis not present

## 2019-04-27 DIAGNOSIS — M6281 Muscle weakness (generalized): Secondary | ICD-10-CM | POA: Diagnosis not present

## 2019-04-27 DIAGNOSIS — M24561 Contracture, right knee: Secondary | ICD-10-CM | POA: Diagnosis not present

## 2019-04-27 DIAGNOSIS — R1319 Other dysphagia: Secondary | ICD-10-CM | POA: Diagnosis not present

## 2019-04-28 DIAGNOSIS — M79661 Pain in right lower leg: Secondary | ICD-10-CM | POA: Diagnosis not present

## 2019-04-28 DIAGNOSIS — J984 Other disorders of lung: Secondary | ICD-10-CM | POA: Diagnosis not present

## 2019-04-28 DIAGNOSIS — M6281 Muscle weakness (generalized): Secondary | ICD-10-CM | POA: Diagnosis not present

## 2019-04-28 DIAGNOSIS — R2681 Unsteadiness on feet: Secondary | ICD-10-CM | POA: Diagnosis not present

## 2019-04-28 DIAGNOSIS — M2450 Contracture, unspecified joint: Secondary | ICD-10-CM | POA: Diagnosis not present

## 2019-04-28 DIAGNOSIS — J9601 Acute respiratory failure with hypoxia: Secondary | ICD-10-CM | POA: Diagnosis not present

## 2019-04-28 DIAGNOSIS — R2689 Other abnormalities of gait and mobility: Secondary | ICD-10-CM | POA: Diagnosis not present

## 2019-04-28 DIAGNOSIS — R1319 Other dysphagia: Secondary | ICD-10-CM | POA: Diagnosis not present

## 2019-04-28 DIAGNOSIS — J690 Pneumonitis due to inhalation of food and vomit: Secondary | ICD-10-CM | POA: Diagnosis not present

## 2019-04-28 DIAGNOSIS — R1312 Dysphagia, oropharyngeal phase: Secondary | ICD-10-CM | POA: Diagnosis not present

## 2019-04-28 DIAGNOSIS — R278 Other lack of coordination: Secondary | ICD-10-CM | POA: Diagnosis not present

## 2019-04-28 DIAGNOSIS — Z9181 History of falling: Secondary | ICD-10-CM | POA: Diagnosis not present

## 2019-04-28 DIAGNOSIS — M24571 Contracture, right ankle: Secondary | ICD-10-CM | POA: Diagnosis not present

## 2019-04-28 DIAGNOSIS — J9690 Respiratory failure, unspecified, unspecified whether with hypoxia or hypercapnia: Secondary | ICD-10-CM | POA: Diagnosis not present

## 2019-04-28 DIAGNOSIS — R293 Abnormal posture: Secondary | ICD-10-CM | POA: Diagnosis not present

## 2019-04-28 DIAGNOSIS — M24561 Contracture, right knee: Secondary | ICD-10-CM | POA: Diagnosis not present

## 2019-04-28 DIAGNOSIS — I10 Essential (primary) hypertension: Secondary | ICD-10-CM | POA: Diagnosis not present

## 2019-04-28 DIAGNOSIS — M79662 Pain in left lower leg: Secondary | ICD-10-CM | POA: Diagnosis not present

## 2019-04-29 DIAGNOSIS — R1312 Dysphagia, oropharyngeal phase: Secondary | ICD-10-CM | POA: Diagnosis not present

## 2019-04-29 DIAGNOSIS — R2689 Other abnormalities of gait and mobility: Secondary | ICD-10-CM | POA: Diagnosis not present

## 2019-04-29 DIAGNOSIS — M2450 Contracture, unspecified joint: Secondary | ICD-10-CM | POA: Diagnosis not present

## 2019-04-29 DIAGNOSIS — J9690 Respiratory failure, unspecified, unspecified whether with hypoxia or hypercapnia: Secondary | ICD-10-CM | POA: Diagnosis not present

## 2019-04-29 DIAGNOSIS — Z9181 History of falling: Secondary | ICD-10-CM | POA: Diagnosis not present

## 2019-04-29 DIAGNOSIS — M24571 Contracture, right ankle: Secondary | ICD-10-CM | POA: Diagnosis not present

## 2019-04-29 DIAGNOSIS — I69969 Other paralytic syndrome following unspecified cerebrovascular disease affecting unspecified side: Secondary | ICD-10-CM | POA: Diagnosis not present

## 2019-04-29 DIAGNOSIS — R1319 Other dysphagia: Secondary | ICD-10-CM | POA: Diagnosis not present

## 2019-04-29 DIAGNOSIS — R278 Other lack of coordination: Secondary | ICD-10-CM | POA: Diagnosis not present

## 2019-04-29 DIAGNOSIS — R2681 Unsteadiness on feet: Secondary | ICD-10-CM | POA: Diagnosis not present

## 2019-04-29 DIAGNOSIS — M79662 Pain in left lower leg: Secondary | ICD-10-CM | POA: Diagnosis not present

## 2019-04-29 DIAGNOSIS — M79661 Pain in right lower leg: Secondary | ICD-10-CM | POA: Diagnosis not present

## 2019-04-29 DIAGNOSIS — R293 Abnormal posture: Secondary | ICD-10-CM | POA: Diagnosis not present

## 2019-04-29 DIAGNOSIS — J9601 Acute respiratory failure with hypoxia: Secondary | ICD-10-CM | POA: Diagnosis not present

## 2019-04-29 DIAGNOSIS — R569 Unspecified convulsions: Secondary | ICD-10-CM | POA: Diagnosis not present

## 2019-04-29 DIAGNOSIS — M6281 Muscle weakness (generalized): Secondary | ICD-10-CM | POA: Diagnosis not present

## 2019-04-29 DIAGNOSIS — M24561 Contracture, right knee: Secondary | ICD-10-CM | POA: Diagnosis not present

## 2019-04-30 DIAGNOSIS — R2689 Other abnormalities of gait and mobility: Secondary | ICD-10-CM | POA: Diagnosis not present

## 2019-04-30 DIAGNOSIS — L89622 Pressure ulcer of left heel, stage 2: Secondary | ICD-10-CM | POA: Diagnosis not present

## 2019-04-30 DIAGNOSIS — J9690 Respiratory failure, unspecified, unspecified whether with hypoxia or hypercapnia: Secondary | ICD-10-CM | POA: Diagnosis not present

## 2019-04-30 DIAGNOSIS — R1319 Other dysphagia: Secondary | ICD-10-CM | POA: Diagnosis not present

## 2019-04-30 DIAGNOSIS — R293 Abnormal posture: Secondary | ICD-10-CM | POA: Diagnosis not present

## 2019-04-30 DIAGNOSIS — Z9181 History of falling: Secondary | ICD-10-CM | POA: Diagnosis not present

## 2019-04-30 DIAGNOSIS — J9601 Acute respiratory failure with hypoxia: Secondary | ICD-10-CM | POA: Diagnosis not present

## 2019-04-30 DIAGNOSIS — R1312 Dysphagia, oropharyngeal phase: Secondary | ICD-10-CM | POA: Diagnosis not present

## 2019-04-30 DIAGNOSIS — L89612 Pressure ulcer of right heel, stage 2: Secondary | ICD-10-CM | POA: Diagnosis not present

## 2019-04-30 DIAGNOSIS — M79662 Pain in left lower leg: Secondary | ICD-10-CM | POA: Diagnosis not present

## 2019-04-30 DIAGNOSIS — M24571 Contracture, right ankle: Secondary | ICD-10-CM | POA: Diagnosis not present

## 2019-04-30 DIAGNOSIS — M2450 Contracture, unspecified joint: Secondary | ICD-10-CM | POA: Diagnosis not present

## 2019-04-30 DIAGNOSIS — L89153 Pressure ulcer of sacral region, stage 3: Secondary | ICD-10-CM | POA: Diagnosis not present

## 2019-04-30 DIAGNOSIS — M79661 Pain in right lower leg: Secondary | ICD-10-CM | POA: Diagnosis not present

## 2019-04-30 DIAGNOSIS — R2681 Unsteadiness on feet: Secondary | ICD-10-CM | POA: Diagnosis not present

## 2019-04-30 DIAGNOSIS — M24561 Contracture, right knee: Secondary | ICD-10-CM | POA: Diagnosis not present

## 2019-04-30 DIAGNOSIS — M6281 Muscle weakness (generalized): Secondary | ICD-10-CM | POA: Diagnosis not present

## 2019-04-30 DIAGNOSIS — R278 Other lack of coordination: Secondary | ICD-10-CM | POA: Diagnosis not present

## 2019-05-01 DIAGNOSIS — R1312 Dysphagia, oropharyngeal phase: Secondary | ICD-10-CM | POA: Diagnosis not present

## 2019-05-01 DIAGNOSIS — J9690 Respiratory failure, unspecified, unspecified whether with hypoxia or hypercapnia: Secondary | ICD-10-CM | POA: Diagnosis not present

## 2019-05-01 DIAGNOSIS — J9601 Acute respiratory failure with hypoxia: Secondary | ICD-10-CM | POA: Diagnosis not present

## 2019-05-01 DIAGNOSIS — R2681 Unsteadiness on feet: Secondary | ICD-10-CM | POA: Diagnosis not present

## 2019-05-01 DIAGNOSIS — M79662 Pain in left lower leg: Secondary | ICD-10-CM | POA: Diagnosis not present

## 2019-05-01 DIAGNOSIS — R278 Other lack of coordination: Secondary | ICD-10-CM | POA: Diagnosis not present

## 2019-05-01 DIAGNOSIS — Z9181 History of falling: Secondary | ICD-10-CM | POA: Diagnosis not present

## 2019-05-01 DIAGNOSIS — M24561 Contracture, right knee: Secondary | ICD-10-CM | POA: Diagnosis not present

## 2019-05-01 DIAGNOSIS — R2689 Other abnormalities of gait and mobility: Secondary | ICD-10-CM | POA: Diagnosis not present

## 2019-05-01 DIAGNOSIS — R293 Abnormal posture: Secondary | ICD-10-CM | POA: Diagnosis not present

## 2019-05-01 DIAGNOSIS — R1319 Other dysphagia: Secondary | ICD-10-CM | POA: Diagnosis not present

## 2019-05-01 DIAGNOSIS — M2450 Contracture, unspecified joint: Secondary | ICD-10-CM | POA: Diagnosis not present

## 2019-05-01 DIAGNOSIS — M79661 Pain in right lower leg: Secondary | ICD-10-CM | POA: Diagnosis not present

## 2019-05-01 DIAGNOSIS — M6281 Muscle weakness (generalized): Secondary | ICD-10-CM | POA: Diagnosis not present

## 2019-05-01 DIAGNOSIS — M24571 Contracture, right ankle: Secondary | ICD-10-CM | POA: Diagnosis not present

## 2019-05-02 DIAGNOSIS — Z9181 History of falling: Secondary | ICD-10-CM | POA: Diagnosis not present

## 2019-05-02 DIAGNOSIS — J9601 Acute respiratory failure with hypoxia: Secondary | ICD-10-CM | POA: Diagnosis not present

## 2019-05-02 DIAGNOSIS — M24561 Contracture, right knee: Secondary | ICD-10-CM | POA: Diagnosis not present

## 2019-05-02 DIAGNOSIS — R278 Other lack of coordination: Secondary | ICD-10-CM | POA: Diagnosis not present

## 2019-05-02 DIAGNOSIS — M6281 Muscle weakness (generalized): Secondary | ICD-10-CM | POA: Diagnosis not present

## 2019-05-02 DIAGNOSIS — M79662 Pain in left lower leg: Secondary | ICD-10-CM | POA: Diagnosis not present

## 2019-05-02 DIAGNOSIS — M24571 Contracture, right ankle: Secondary | ICD-10-CM | POA: Diagnosis not present

## 2019-05-02 DIAGNOSIS — R1312 Dysphagia, oropharyngeal phase: Secondary | ICD-10-CM | POA: Diagnosis not present

## 2019-05-02 DIAGNOSIS — R2689 Other abnormalities of gait and mobility: Secondary | ICD-10-CM | POA: Diagnosis not present

## 2019-05-02 DIAGNOSIS — R293 Abnormal posture: Secondary | ICD-10-CM | POA: Diagnosis not present

## 2019-05-02 DIAGNOSIS — M79661 Pain in right lower leg: Secondary | ICD-10-CM | POA: Diagnosis not present

## 2019-05-02 DIAGNOSIS — R1319 Other dysphagia: Secondary | ICD-10-CM | POA: Diagnosis not present

## 2019-05-02 DIAGNOSIS — R2681 Unsteadiness on feet: Secondary | ICD-10-CM | POA: Diagnosis not present

## 2019-05-02 DIAGNOSIS — M2450 Contracture, unspecified joint: Secondary | ICD-10-CM | POA: Diagnosis not present

## 2019-05-02 DIAGNOSIS — J9690 Respiratory failure, unspecified, unspecified whether with hypoxia or hypercapnia: Secondary | ICD-10-CM | POA: Diagnosis not present

## 2019-05-03 DIAGNOSIS — J9601 Acute respiratory failure with hypoxia: Secondary | ICD-10-CM | POA: Diagnosis not present

## 2019-05-03 DIAGNOSIS — M79661 Pain in right lower leg: Secondary | ICD-10-CM | POA: Diagnosis not present

## 2019-05-03 DIAGNOSIS — R1319 Other dysphagia: Secondary | ICD-10-CM | POA: Diagnosis not present

## 2019-05-03 DIAGNOSIS — Z9181 History of falling: Secondary | ICD-10-CM | POA: Diagnosis not present

## 2019-05-03 DIAGNOSIS — R293 Abnormal posture: Secondary | ICD-10-CM | POA: Diagnosis not present

## 2019-05-03 DIAGNOSIS — M24561 Contracture, right knee: Secondary | ICD-10-CM | POA: Diagnosis not present

## 2019-05-03 DIAGNOSIS — R278 Other lack of coordination: Secondary | ICD-10-CM | POA: Diagnosis not present

## 2019-05-03 DIAGNOSIS — M6281 Muscle weakness (generalized): Secondary | ICD-10-CM | POA: Diagnosis not present

## 2019-05-03 DIAGNOSIS — M2450 Contracture, unspecified joint: Secondary | ICD-10-CM | POA: Diagnosis not present

## 2019-05-03 DIAGNOSIS — R1312 Dysphagia, oropharyngeal phase: Secondary | ICD-10-CM | POA: Diagnosis not present

## 2019-05-03 DIAGNOSIS — R2681 Unsteadiness on feet: Secondary | ICD-10-CM | POA: Diagnosis not present

## 2019-05-03 DIAGNOSIS — M79662 Pain in left lower leg: Secondary | ICD-10-CM | POA: Diagnosis not present

## 2019-05-03 DIAGNOSIS — R2689 Other abnormalities of gait and mobility: Secondary | ICD-10-CM | POA: Diagnosis not present

## 2019-05-03 DIAGNOSIS — M24571 Contracture, right ankle: Secondary | ICD-10-CM | POA: Diagnosis not present

## 2019-05-03 DIAGNOSIS — J9690 Respiratory failure, unspecified, unspecified whether with hypoxia or hypercapnia: Secondary | ICD-10-CM | POA: Diagnosis not present

## 2019-05-04 DIAGNOSIS — M2450 Contracture, unspecified joint: Secondary | ICD-10-CM | POA: Diagnosis not present

## 2019-05-04 DIAGNOSIS — M24561 Contracture, right knee: Secondary | ICD-10-CM | POA: Diagnosis not present

## 2019-05-04 DIAGNOSIS — R1312 Dysphagia, oropharyngeal phase: Secondary | ICD-10-CM | POA: Diagnosis not present

## 2019-05-04 DIAGNOSIS — M79661 Pain in right lower leg: Secondary | ICD-10-CM | POA: Diagnosis not present

## 2019-05-04 DIAGNOSIS — M24571 Contracture, right ankle: Secondary | ICD-10-CM | POA: Diagnosis not present

## 2019-05-04 DIAGNOSIS — R1319 Other dysphagia: Secondary | ICD-10-CM | POA: Diagnosis not present

## 2019-05-04 DIAGNOSIS — R278 Other lack of coordination: Secondary | ICD-10-CM | POA: Diagnosis not present

## 2019-05-04 DIAGNOSIS — R2681 Unsteadiness on feet: Secondary | ICD-10-CM | POA: Diagnosis not present

## 2019-05-04 DIAGNOSIS — M79662 Pain in left lower leg: Secondary | ICD-10-CM | POA: Diagnosis not present

## 2019-05-04 DIAGNOSIS — Z9181 History of falling: Secondary | ICD-10-CM | POA: Diagnosis not present

## 2019-05-04 DIAGNOSIS — M6281 Muscle weakness (generalized): Secondary | ICD-10-CM | POA: Diagnosis not present

## 2019-05-04 DIAGNOSIS — J9690 Respiratory failure, unspecified, unspecified whether with hypoxia or hypercapnia: Secondary | ICD-10-CM | POA: Diagnosis not present

## 2019-05-04 DIAGNOSIS — J9601 Acute respiratory failure with hypoxia: Secondary | ICD-10-CM | POA: Diagnosis not present

## 2019-05-04 DIAGNOSIS — R293 Abnormal posture: Secondary | ICD-10-CM | POA: Diagnosis not present

## 2019-05-04 DIAGNOSIS — R2689 Other abnormalities of gait and mobility: Secondary | ICD-10-CM | POA: Diagnosis not present

## 2019-05-05 DIAGNOSIS — M6281 Muscle weakness (generalized): Secondary | ICD-10-CM | POA: Diagnosis not present

## 2019-05-05 DIAGNOSIS — J9690 Respiratory failure, unspecified, unspecified whether with hypoxia or hypercapnia: Secondary | ICD-10-CM | POA: Diagnosis not present

## 2019-05-05 DIAGNOSIS — J9601 Acute respiratory failure with hypoxia: Secondary | ICD-10-CM | POA: Diagnosis not present

## 2019-05-05 DIAGNOSIS — R278 Other lack of coordination: Secondary | ICD-10-CM | POA: Diagnosis not present

## 2019-05-05 DIAGNOSIS — M24571 Contracture, right ankle: Secondary | ICD-10-CM | POA: Diagnosis not present

## 2019-05-05 DIAGNOSIS — R293 Abnormal posture: Secondary | ICD-10-CM | POA: Diagnosis not present

## 2019-05-05 DIAGNOSIS — R2681 Unsteadiness on feet: Secondary | ICD-10-CM | POA: Diagnosis not present

## 2019-05-05 DIAGNOSIS — M79662 Pain in left lower leg: Secondary | ICD-10-CM | POA: Diagnosis not present

## 2019-05-05 DIAGNOSIS — R2689 Other abnormalities of gait and mobility: Secondary | ICD-10-CM | POA: Diagnosis not present

## 2019-05-05 DIAGNOSIS — M2450 Contracture, unspecified joint: Secondary | ICD-10-CM | POA: Diagnosis not present

## 2019-05-05 DIAGNOSIS — Z9181 History of falling: Secondary | ICD-10-CM | POA: Diagnosis not present

## 2019-05-05 DIAGNOSIS — R1319 Other dysphagia: Secondary | ICD-10-CM | POA: Diagnosis not present

## 2019-05-05 DIAGNOSIS — M79661 Pain in right lower leg: Secondary | ICD-10-CM | POA: Diagnosis not present

## 2019-05-05 DIAGNOSIS — R1312 Dysphagia, oropharyngeal phase: Secondary | ICD-10-CM | POA: Diagnosis not present

## 2019-05-05 DIAGNOSIS — M24561 Contracture, right knee: Secondary | ICD-10-CM | POA: Diagnosis not present

## 2019-05-06 DIAGNOSIS — R1312 Dysphagia, oropharyngeal phase: Secondary | ICD-10-CM | POA: Diagnosis not present

## 2019-05-06 DIAGNOSIS — M24571 Contracture, right ankle: Secondary | ICD-10-CM | POA: Diagnosis not present

## 2019-05-06 DIAGNOSIS — M6281 Muscle weakness (generalized): Secondary | ICD-10-CM | POA: Diagnosis not present

## 2019-05-06 DIAGNOSIS — R278 Other lack of coordination: Secondary | ICD-10-CM | POA: Diagnosis not present

## 2019-05-06 DIAGNOSIS — J9601 Acute respiratory failure with hypoxia: Secondary | ICD-10-CM | POA: Diagnosis not present

## 2019-05-06 DIAGNOSIS — R2689 Other abnormalities of gait and mobility: Secondary | ICD-10-CM | POA: Diagnosis not present

## 2019-05-06 DIAGNOSIS — M2450 Contracture, unspecified joint: Secondary | ICD-10-CM | POA: Diagnosis not present

## 2019-05-06 DIAGNOSIS — R293 Abnormal posture: Secondary | ICD-10-CM | POA: Diagnosis not present

## 2019-05-06 DIAGNOSIS — M79662 Pain in left lower leg: Secondary | ICD-10-CM | POA: Diagnosis not present

## 2019-05-06 DIAGNOSIS — J9690 Respiratory failure, unspecified, unspecified whether with hypoxia or hypercapnia: Secondary | ICD-10-CM | POA: Diagnosis not present

## 2019-05-06 DIAGNOSIS — R1319 Other dysphagia: Secondary | ICD-10-CM | POA: Diagnosis not present

## 2019-05-06 DIAGNOSIS — Z9181 History of falling: Secondary | ICD-10-CM | POA: Diagnosis not present

## 2019-05-06 DIAGNOSIS — M24561 Contracture, right knee: Secondary | ICD-10-CM | POA: Diagnosis not present

## 2019-05-06 DIAGNOSIS — M79661 Pain in right lower leg: Secondary | ICD-10-CM | POA: Diagnosis not present

## 2019-05-06 DIAGNOSIS — R2681 Unsteadiness on feet: Secondary | ICD-10-CM | POA: Diagnosis not present

## 2019-05-07 DIAGNOSIS — R1312 Dysphagia, oropharyngeal phase: Secondary | ICD-10-CM | POA: Diagnosis not present

## 2019-05-07 DIAGNOSIS — Z9181 History of falling: Secondary | ICD-10-CM | POA: Diagnosis not present

## 2019-05-07 DIAGNOSIS — M79661 Pain in right lower leg: Secondary | ICD-10-CM | POA: Diagnosis not present

## 2019-05-07 DIAGNOSIS — M6281 Muscle weakness (generalized): Secondary | ICD-10-CM | POA: Diagnosis not present

## 2019-05-07 DIAGNOSIS — R293 Abnormal posture: Secondary | ICD-10-CM | POA: Diagnosis not present

## 2019-05-07 DIAGNOSIS — J9690 Respiratory failure, unspecified, unspecified whether with hypoxia or hypercapnia: Secondary | ICD-10-CM | POA: Diagnosis not present

## 2019-05-07 DIAGNOSIS — R1319 Other dysphagia: Secondary | ICD-10-CM | POA: Diagnosis not present

## 2019-05-07 DIAGNOSIS — M2450 Contracture, unspecified joint: Secondary | ICD-10-CM | POA: Diagnosis not present

## 2019-05-07 DIAGNOSIS — R2689 Other abnormalities of gait and mobility: Secondary | ICD-10-CM | POA: Diagnosis not present

## 2019-05-07 DIAGNOSIS — J9601 Acute respiratory failure with hypoxia: Secondary | ICD-10-CM | POA: Diagnosis not present

## 2019-05-07 DIAGNOSIS — R278 Other lack of coordination: Secondary | ICD-10-CM | POA: Diagnosis not present

## 2019-05-07 DIAGNOSIS — M79662 Pain in left lower leg: Secondary | ICD-10-CM | POA: Diagnosis not present

## 2019-05-07 DIAGNOSIS — R2681 Unsteadiness on feet: Secondary | ICD-10-CM | POA: Diagnosis not present

## 2019-05-07 DIAGNOSIS — M24561 Contracture, right knee: Secondary | ICD-10-CM | POA: Diagnosis not present

## 2019-05-07 DIAGNOSIS — M24571 Contracture, right ankle: Secondary | ICD-10-CM | POA: Diagnosis not present

## 2019-05-08 DIAGNOSIS — J9690 Respiratory failure, unspecified, unspecified whether with hypoxia or hypercapnia: Secondary | ICD-10-CM | POA: Diagnosis not present

## 2019-05-08 DIAGNOSIS — M24571 Contracture, right ankle: Secondary | ICD-10-CM | POA: Diagnosis not present

## 2019-05-08 DIAGNOSIS — R2689 Other abnormalities of gait and mobility: Secondary | ICD-10-CM | POA: Diagnosis not present

## 2019-05-08 DIAGNOSIS — R1312 Dysphagia, oropharyngeal phase: Secondary | ICD-10-CM | POA: Diagnosis not present

## 2019-05-08 DIAGNOSIS — M6281 Muscle weakness (generalized): Secondary | ICD-10-CM | POA: Diagnosis not present

## 2019-05-08 DIAGNOSIS — M79662 Pain in left lower leg: Secondary | ICD-10-CM | POA: Diagnosis not present

## 2019-05-08 DIAGNOSIS — M79661 Pain in right lower leg: Secondary | ICD-10-CM | POA: Diagnosis not present

## 2019-05-08 DIAGNOSIS — Z9181 History of falling: Secondary | ICD-10-CM | POA: Diagnosis not present

## 2019-05-08 DIAGNOSIS — R2681 Unsteadiness on feet: Secondary | ICD-10-CM | POA: Diagnosis not present

## 2019-05-08 DIAGNOSIS — M24561 Contracture, right knee: Secondary | ICD-10-CM | POA: Diagnosis not present

## 2019-05-08 DIAGNOSIS — R278 Other lack of coordination: Secondary | ICD-10-CM | POA: Diagnosis not present

## 2019-05-08 DIAGNOSIS — R293 Abnormal posture: Secondary | ICD-10-CM | POA: Diagnosis not present

## 2019-05-08 DIAGNOSIS — R1319 Other dysphagia: Secondary | ICD-10-CM | POA: Diagnosis not present

## 2019-05-08 DIAGNOSIS — J9601 Acute respiratory failure with hypoxia: Secondary | ICD-10-CM | POA: Diagnosis not present

## 2019-05-08 DIAGNOSIS — M2450 Contracture, unspecified joint: Secondary | ICD-10-CM | POA: Diagnosis not present

## 2019-05-09 DIAGNOSIS — M6281 Muscle weakness (generalized): Secondary | ICD-10-CM | POA: Diagnosis not present

## 2019-05-09 DIAGNOSIS — M24571 Contracture, right ankle: Secondary | ICD-10-CM | POA: Diagnosis not present

## 2019-05-09 DIAGNOSIS — R293 Abnormal posture: Secondary | ICD-10-CM | POA: Diagnosis not present

## 2019-05-09 DIAGNOSIS — M79661 Pain in right lower leg: Secondary | ICD-10-CM | POA: Diagnosis not present

## 2019-05-09 DIAGNOSIS — R2681 Unsteadiness on feet: Secondary | ICD-10-CM | POA: Diagnosis not present

## 2019-05-09 DIAGNOSIS — M2450 Contracture, unspecified joint: Secondary | ICD-10-CM | POA: Diagnosis not present

## 2019-05-09 DIAGNOSIS — R1319 Other dysphagia: Secondary | ICD-10-CM | POA: Diagnosis not present

## 2019-05-09 DIAGNOSIS — Z9181 History of falling: Secondary | ICD-10-CM | POA: Diagnosis not present

## 2019-05-09 DIAGNOSIS — R2689 Other abnormalities of gait and mobility: Secondary | ICD-10-CM | POA: Diagnosis not present

## 2019-05-09 DIAGNOSIS — J9601 Acute respiratory failure with hypoxia: Secondary | ICD-10-CM | POA: Diagnosis not present

## 2019-05-09 DIAGNOSIS — J168 Pneumonia due to other specified infectious organisms: Secondary | ICD-10-CM | POA: Diagnosis not present

## 2019-05-09 DIAGNOSIS — M79662 Pain in left lower leg: Secondary | ICD-10-CM | POA: Diagnosis not present

## 2019-05-09 DIAGNOSIS — R278 Other lack of coordination: Secondary | ICD-10-CM | POA: Diagnosis not present

## 2019-05-09 DIAGNOSIS — Z7409 Other reduced mobility: Secondary | ICD-10-CM | POA: Diagnosis not present

## 2019-05-09 DIAGNOSIS — R1312 Dysphagia, oropharyngeal phase: Secondary | ICD-10-CM | POA: Diagnosis not present

## 2019-05-09 DIAGNOSIS — M24561 Contracture, right knee: Secondary | ICD-10-CM | POA: Diagnosis not present

## 2019-05-10 DIAGNOSIS — R1319 Other dysphagia: Secondary | ICD-10-CM | POA: Diagnosis not present

## 2019-05-10 DIAGNOSIS — M2450 Contracture, unspecified joint: Secondary | ICD-10-CM | POA: Diagnosis not present

## 2019-05-10 DIAGNOSIS — Z9181 History of falling: Secondary | ICD-10-CM | POA: Diagnosis not present

## 2019-05-10 DIAGNOSIS — M24561 Contracture, right knee: Secondary | ICD-10-CM | POA: Diagnosis not present

## 2019-05-10 DIAGNOSIS — M24571 Contracture, right ankle: Secondary | ICD-10-CM | POA: Diagnosis not present

## 2019-05-10 DIAGNOSIS — J168 Pneumonia due to other specified infectious organisms: Secondary | ICD-10-CM | POA: Diagnosis not present

## 2019-05-10 DIAGNOSIS — Z7409 Other reduced mobility: Secondary | ICD-10-CM | POA: Diagnosis not present

## 2019-05-10 DIAGNOSIS — M6281 Muscle weakness (generalized): Secondary | ICD-10-CM | POA: Diagnosis not present

## 2019-05-10 DIAGNOSIS — R278 Other lack of coordination: Secondary | ICD-10-CM | POA: Diagnosis not present

## 2019-05-10 DIAGNOSIS — R2689 Other abnormalities of gait and mobility: Secondary | ICD-10-CM | POA: Diagnosis not present

## 2019-05-10 DIAGNOSIS — R1312 Dysphagia, oropharyngeal phase: Secondary | ICD-10-CM | POA: Diagnosis not present

## 2019-05-10 DIAGNOSIS — M79661 Pain in right lower leg: Secondary | ICD-10-CM | POA: Diagnosis not present

## 2019-05-10 DIAGNOSIS — J9601 Acute respiratory failure with hypoxia: Secondary | ICD-10-CM | POA: Diagnosis not present

## 2019-05-10 DIAGNOSIS — R293 Abnormal posture: Secondary | ICD-10-CM | POA: Diagnosis not present

## 2019-05-10 DIAGNOSIS — R2681 Unsteadiness on feet: Secondary | ICD-10-CM | POA: Diagnosis not present

## 2019-05-10 DIAGNOSIS — M79662 Pain in left lower leg: Secondary | ICD-10-CM | POA: Diagnosis not present

## 2019-05-11 DIAGNOSIS — J168 Pneumonia due to other specified infectious organisms: Secondary | ICD-10-CM | POA: Diagnosis not present

## 2019-05-11 DIAGNOSIS — J9601 Acute respiratory failure with hypoxia: Secondary | ICD-10-CM | POA: Diagnosis not present

## 2019-05-11 DIAGNOSIS — R2681 Unsteadiness on feet: Secondary | ICD-10-CM | POA: Diagnosis not present

## 2019-05-11 DIAGNOSIS — M2450 Contracture, unspecified joint: Secondary | ICD-10-CM | POA: Diagnosis not present

## 2019-05-11 DIAGNOSIS — M79662 Pain in left lower leg: Secondary | ICD-10-CM | POA: Diagnosis not present

## 2019-05-11 DIAGNOSIS — Z7409 Other reduced mobility: Secondary | ICD-10-CM | POA: Diagnosis not present

## 2019-05-11 DIAGNOSIS — Z9181 History of falling: Secondary | ICD-10-CM | POA: Diagnosis not present

## 2019-05-11 DIAGNOSIS — M24571 Contracture, right ankle: Secondary | ICD-10-CM | POA: Diagnosis not present

## 2019-05-11 DIAGNOSIS — R1312 Dysphagia, oropharyngeal phase: Secondary | ICD-10-CM | POA: Diagnosis not present

## 2019-05-11 DIAGNOSIS — R278 Other lack of coordination: Secondary | ICD-10-CM | POA: Diagnosis not present

## 2019-05-11 DIAGNOSIS — M79661 Pain in right lower leg: Secondary | ICD-10-CM | POA: Diagnosis not present

## 2019-05-11 DIAGNOSIS — M6281 Muscle weakness (generalized): Secondary | ICD-10-CM | POA: Diagnosis not present

## 2019-05-11 DIAGNOSIS — R2689 Other abnormalities of gait and mobility: Secondary | ICD-10-CM | POA: Diagnosis not present

## 2019-05-11 DIAGNOSIS — R1319 Other dysphagia: Secondary | ICD-10-CM | POA: Diagnosis not present

## 2019-05-11 DIAGNOSIS — R293 Abnormal posture: Secondary | ICD-10-CM | POA: Diagnosis not present

## 2019-05-11 DIAGNOSIS — M24561 Contracture, right knee: Secondary | ICD-10-CM | POA: Diagnosis not present

## 2019-05-12 DIAGNOSIS — R2681 Unsteadiness on feet: Secondary | ICD-10-CM | POA: Diagnosis not present

## 2019-05-12 DIAGNOSIS — M2450 Contracture, unspecified joint: Secondary | ICD-10-CM | POA: Diagnosis not present

## 2019-05-12 DIAGNOSIS — M24571 Contracture, right ankle: Secondary | ICD-10-CM | POA: Diagnosis not present

## 2019-05-12 DIAGNOSIS — Z9181 History of falling: Secondary | ICD-10-CM | POA: Diagnosis not present

## 2019-05-12 DIAGNOSIS — M24561 Contracture, right knee: Secondary | ICD-10-CM | POA: Diagnosis not present

## 2019-05-12 DIAGNOSIS — R1312 Dysphagia, oropharyngeal phase: Secondary | ICD-10-CM | POA: Diagnosis not present

## 2019-05-12 DIAGNOSIS — M6281 Muscle weakness (generalized): Secondary | ICD-10-CM | POA: Diagnosis not present

## 2019-05-12 DIAGNOSIS — R1319 Other dysphagia: Secondary | ICD-10-CM | POA: Diagnosis not present

## 2019-05-12 DIAGNOSIS — J9601 Acute respiratory failure with hypoxia: Secondary | ICD-10-CM | POA: Diagnosis not present

## 2019-05-12 DIAGNOSIS — M79662 Pain in left lower leg: Secondary | ICD-10-CM | POA: Diagnosis not present

## 2019-05-12 DIAGNOSIS — R278 Other lack of coordination: Secondary | ICD-10-CM | POA: Diagnosis not present

## 2019-05-12 DIAGNOSIS — R2689 Other abnormalities of gait and mobility: Secondary | ICD-10-CM | POA: Diagnosis not present

## 2019-05-12 DIAGNOSIS — M79661 Pain in right lower leg: Secondary | ICD-10-CM | POA: Diagnosis not present

## 2019-05-12 DIAGNOSIS — J168 Pneumonia due to other specified infectious organisms: Secondary | ICD-10-CM | POA: Diagnosis not present

## 2019-05-12 DIAGNOSIS — R293 Abnormal posture: Secondary | ICD-10-CM | POA: Diagnosis not present

## 2019-05-12 DIAGNOSIS — Z7409 Other reduced mobility: Secondary | ICD-10-CM | POA: Diagnosis not present

## 2019-05-12 DIAGNOSIS — L89153 Pressure ulcer of sacral region, stage 3: Secondary | ICD-10-CM | POA: Diagnosis not present

## 2019-05-13 DIAGNOSIS — M24561 Contracture, right knee: Secondary | ICD-10-CM | POA: Diagnosis not present

## 2019-05-13 DIAGNOSIS — R2689 Other abnormalities of gait and mobility: Secondary | ICD-10-CM | POA: Diagnosis not present

## 2019-05-13 DIAGNOSIS — M6281 Muscle weakness (generalized): Secondary | ICD-10-CM | POA: Diagnosis not present

## 2019-05-13 DIAGNOSIS — Z7409 Other reduced mobility: Secondary | ICD-10-CM | POA: Diagnosis not present

## 2019-05-13 DIAGNOSIS — R1312 Dysphagia, oropharyngeal phase: Secondary | ICD-10-CM | POA: Diagnosis not present

## 2019-05-13 DIAGNOSIS — R1319 Other dysphagia: Secondary | ICD-10-CM | POA: Diagnosis not present

## 2019-05-13 DIAGNOSIS — J9601 Acute respiratory failure with hypoxia: Secondary | ICD-10-CM | POA: Diagnosis not present

## 2019-05-13 DIAGNOSIS — R278 Other lack of coordination: Secondary | ICD-10-CM | POA: Diagnosis not present

## 2019-05-13 DIAGNOSIS — Z9181 History of falling: Secondary | ICD-10-CM | POA: Diagnosis not present

## 2019-05-13 DIAGNOSIS — M79662 Pain in left lower leg: Secondary | ICD-10-CM | POA: Diagnosis not present

## 2019-05-13 DIAGNOSIS — M79661 Pain in right lower leg: Secondary | ICD-10-CM | POA: Diagnosis not present

## 2019-05-13 DIAGNOSIS — R2681 Unsteadiness on feet: Secondary | ICD-10-CM | POA: Diagnosis not present

## 2019-05-13 DIAGNOSIS — J168 Pneumonia due to other specified infectious organisms: Secondary | ICD-10-CM | POA: Diagnosis not present

## 2019-05-13 DIAGNOSIS — M24571 Contracture, right ankle: Secondary | ICD-10-CM | POA: Diagnosis not present

## 2019-05-13 DIAGNOSIS — M2450 Contracture, unspecified joint: Secondary | ICD-10-CM | POA: Diagnosis not present

## 2019-05-13 DIAGNOSIS — R293 Abnormal posture: Secondary | ICD-10-CM | POA: Diagnosis not present

## 2019-05-14 ENCOUNTER — Inpatient Hospital Stay (HOSPITAL_COMMUNITY)
Admission: EM | Admit: 2019-05-14 | Discharge: 2019-05-17 | DRG: 177 | Disposition: A | Discharge status: Skilled Nursing Facility | Payer: Medicare Other | Source: Skilled Nursing Facility | Attending: Internal Medicine | Admitting: Internal Medicine

## 2019-05-14 ENCOUNTER — Emergency Department (HOSPITAL_COMMUNITY): Payer: Medicare Other

## 2019-05-14 ENCOUNTER — Other Ambulatory Visit: Payer: Self-pay

## 2019-05-14 DIAGNOSIS — Z885 Allergy status to narcotic agent status: Secondary | ICD-10-CM

## 2019-05-14 DIAGNOSIS — I69391 Dysphagia following cerebral infarction: Secondary | ICD-10-CM | POA: Diagnosis not present

## 2019-05-14 DIAGNOSIS — M779 Enthesopathy, unspecified: Secondary | ICD-10-CM | POA: Diagnosis present

## 2019-05-14 DIAGNOSIS — L89313 Pressure ulcer of right buttock, stage 3: Secondary | ICD-10-CM | POA: Diagnosis present

## 2019-05-14 DIAGNOSIS — E162 Hypoglycemia, unspecified: Secondary | ICD-10-CM | POA: Diagnosis present

## 2019-05-14 DIAGNOSIS — Z91018 Allergy to other foods: Secondary | ICD-10-CM

## 2019-05-14 DIAGNOSIS — G459 Transient cerebral ischemic attack, unspecified: Secondary | ICD-10-CM | POA: Diagnosis not present

## 2019-05-14 DIAGNOSIS — Z823 Family history of stroke: Secondary | ICD-10-CM | POA: Diagnosis not present

## 2019-05-14 DIAGNOSIS — I739 Peripheral vascular disease, unspecified: Secondary | ICD-10-CM | POA: Diagnosis present

## 2019-05-14 DIAGNOSIS — J690 Pneumonitis due to inhalation of food and vomit: Principal | ICD-10-CM | POA: Diagnosis present

## 2019-05-14 DIAGNOSIS — Z66 Do not resuscitate: Secondary | ICD-10-CM | POA: Diagnosis present

## 2019-05-14 DIAGNOSIS — Z7982 Long term (current) use of aspirin: Secondary | ICD-10-CM

## 2019-05-14 DIAGNOSIS — M109 Gout, unspecified: Secondary | ICD-10-CM | POA: Diagnosis present

## 2019-05-14 DIAGNOSIS — Z808 Family history of malignant neoplasm of other organs or systems: Secondary | ICD-10-CM

## 2019-05-14 DIAGNOSIS — Z833 Family history of diabetes mellitus: Secondary | ICD-10-CM | POA: Diagnosis not present

## 2019-05-14 DIAGNOSIS — Z87891 Personal history of nicotine dependence: Secondary | ICD-10-CM | POA: Diagnosis not present

## 2019-05-14 DIAGNOSIS — E876 Hypokalemia: Secondary | ICD-10-CM | POA: Diagnosis not present

## 2019-05-14 DIAGNOSIS — F0150 Vascular dementia without behavioral disturbance: Secondary | ICD-10-CM | POA: Diagnosis present

## 2019-05-14 DIAGNOSIS — R0902 Hypoxemia: Secondary | ICD-10-CM | POA: Diagnosis present

## 2019-05-14 DIAGNOSIS — G9341 Metabolic encephalopathy: Secondary | ICD-10-CM | POA: Diagnosis present

## 2019-05-14 DIAGNOSIS — R4182 Altered mental status, unspecified: Secondary | ICD-10-CM

## 2019-05-14 DIAGNOSIS — Z7189 Other specified counseling: Secondary | ICD-10-CM

## 2019-05-14 DIAGNOSIS — J189 Pneumonia, unspecified organism: Secondary | ICD-10-CM | POA: Diagnosis not present

## 2019-05-14 DIAGNOSIS — E43 Unspecified severe protein-calorie malnutrition: Secondary | ICD-10-CM | POA: Diagnosis present

## 2019-05-14 DIAGNOSIS — J439 Emphysema, unspecified: Secondary | ICD-10-CM | POA: Diagnosis present

## 2019-05-14 DIAGNOSIS — L89611 Pressure ulcer of right heel, stage 1: Secondary | ICD-10-CM | POA: Diagnosis present

## 2019-05-14 DIAGNOSIS — R404 Transient alteration of awareness: Secondary | ICD-10-CM | POA: Diagnosis not present

## 2019-05-14 DIAGNOSIS — Z743 Need for continuous supervision: Secondary | ICD-10-CM | POA: Diagnosis not present

## 2019-05-14 DIAGNOSIS — Z20822 Contact with and (suspected) exposure to covid-19: Secondary | ICD-10-CM | POA: Diagnosis present

## 2019-05-14 DIAGNOSIS — Z79899 Other long term (current) drug therapy: Secondary | ICD-10-CM

## 2019-05-14 DIAGNOSIS — Z8249 Family history of ischemic heart disease and other diseases of the circulatory system: Secondary | ICD-10-CM | POA: Diagnosis not present

## 2019-05-14 DIAGNOSIS — I10 Essential (primary) hypertension: Secondary | ICD-10-CM | POA: Diagnosis present

## 2019-05-14 DIAGNOSIS — M2550 Pain in unspecified joint: Secondary | ICD-10-CM | POA: Diagnosis not present

## 2019-05-14 DIAGNOSIS — F1011 Alcohol abuse, in remission: Secondary | ICD-10-CM | POA: Diagnosis present

## 2019-05-14 DIAGNOSIS — J479 Bronchiectasis, uncomplicated: Secondary | ICD-10-CM | POA: Diagnosis not present

## 2019-05-14 DIAGNOSIS — Z800 Family history of malignant neoplasm of digestive organs: Secondary | ICD-10-CM | POA: Diagnosis not present

## 2019-05-14 DIAGNOSIS — Z515 Encounter for palliative care: Secondary | ICD-10-CM | POA: Diagnosis not present

## 2019-05-14 DIAGNOSIS — R0602 Shortness of breath: Secondary | ICD-10-CM | POA: Diagnosis not present

## 2019-05-14 DIAGNOSIS — Z681 Body mass index (BMI) 19 or less, adult: Secondary | ICD-10-CM | POA: Diagnosis not present

## 2019-05-14 DIAGNOSIS — I724 Aneurysm of artery of lower extremity: Secondary | ICD-10-CM | POA: Diagnosis present

## 2019-05-14 DIAGNOSIS — J432 Centrilobular emphysema: Secondary | ICD-10-CM | POA: Diagnosis not present

## 2019-05-14 DIAGNOSIS — G40409 Other generalized epilepsy and epileptic syndromes, not intractable, without status epilepticus: Secondary | ICD-10-CM | POA: Diagnosis present

## 2019-05-14 DIAGNOSIS — R52 Pain, unspecified: Secondary | ICD-10-CM | POA: Diagnosis not present

## 2019-05-14 DIAGNOSIS — Z886 Allergy status to analgesic agent status: Secondary | ICD-10-CM

## 2019-05-14 DIAGNOSIS — R079 Chest pain, unspecified: Secondary | ICD-10-CM | POA: Diagnosis not present

## 2019-05-14 DIAGNOSIS — Z880 Allergy status to penicillin: Secondary | ICD-10-CM

## 2019-05-14 DIAGNOSIS — R1310 Dysphagia, unspecified: Secondary | ICD-10-CM | POA: Diagnosis not present

## 2019-05-14 DIAGNOSIS — Z7401 Bed confinement status: Secondary | ICD-10-CM | POA: Diagnosis not present

## 2019-05-14 DIAGNOSIS — M1990 Unspecified osteoarthritis, unspecified site: Secondary | ICD-10-CM | POA: Diagnosis present

## 2019-05-14 DIAGNOSIS — R627 Adult failure to thrive: Secondary | ICD-10-CM | POA: Diagnosis present

## 2019-05-14 LAB — CBC WITH DIFFERENTIAL/PLATELET
Abs Immature Granulocytes: 0.07 10*3/uL (ref 0.00–0.07)
Basophils Absolute: 0.1 10*3/uL (ref 0.0–0.1)
Basophils Relative: 0 %
Eosinophils Absolute: 0.1 10*3/uL (ref 0.0–0.5)
Eosinophils Relative: 1 %
HCT: 43.4 % (ref 39.0–52.0)
Hemoglobin: 14.1 g/dL (ref 13.0–17.0)
Immature Granulocytes: 1 %
Lymphocytes Relative: 13 %
Lymphs Abs: 1.6 10*3/uL (ref 0.7–4.0)
MCH: 32.9 pg (ref 26.0–34.0)
MCHC: 32.5 g/dL (ref 30.0–36.0)
MCV: 101.4 fL — ABNORMAL HIGH (ref 80.0–100.0)
Monocytes Absolute: 0.5 10*3/uL (ref 0.1–1.0)
Monocytes Relative: 4 %
Neutro Abs: 10.2 10*3/uL — ABNORMAL HIGH (ref 1.7–7.7)
Neutrophils Relative %: 81 %
Platelets: 408 10*3/uL — ABNORMAL HIGH (ref 150–400)
RBC: 4.28 MIL/uL (ref 4.22–5.81)
RDW: 15.5 % (ref 11.5–15.5)
WBC: 12.5 10*3/uL — ABNORMAL HIGH (ref 4.0–10.5)
nRBC: 0 % (ref 0.0–0.2)

## 2019-05-14 LAB — BASIC METABOLIC PANEL
Anion gap: 10 (ref 5–15)
BUN: 10 mg/dL (ref 8–23)
CO2: 28 mmol/L (ref 22–32)
Calcium: 9 mg/dL (ref 8.9–10.3)
Chloride: 101 mmol/L (ref 98–111)
Creatinine, Ser: 0.76 mg/dL (ref 0.61–1.24)
GFR calc Af Amer: >60 mL/min (ref >60)
GFR calc non Af Amer: >60 mL/min (ref >60)
Glucose, Bld: 102 mg/dL — ABNORMAL HIGH (ref 70–99)
Potassium: 4.6 mmol/L (ref 3.5–5.1)
Sodium: 139 mmol/L (ref 135–145)

## 2019-05-14 LAB — RESPIRATORY PANEL BY RT PCR (FLU A&B, COVID)
Influenza A by PCR: NEGATIVE
Influenza B by PCR: NEGATIVE
SARS Coronavirus 2 by RT PCR: NEGATIVE

## 2019-05-14 LAB — CBG MONITORING, ED: Glucose-Capillary: 102 mg/dL — ABNORMAL HIGH (ref 70–99)

## 2019-05-14 MED ORDER — HYPROMELLOSE (GONIOSCOPIC) 2.5 % OP SOLN
2.0000 [drp] | Freq: Three times a day (TID) | OPHTHALMIC | Status: DC | PRN
  Administered 2019-05-16: 2 [drp] via OPHTHALMIC
  Filled 2019-05-14: qty 15

## 2019-05-14 MED ORDER — SODIUM CHLORIDE 0.9 % IV SOLN
3.0000 g | Freq: Four times a day (QID) | INTRAVENOUS | Status: DC
  Administered 2019-05-14 – 2019-05-17 (×10): 3 g via INTRAVENOUS
  Filled 2019-05-14: qty 8
  Filled 2019-05-14: qty 3
  Filled 2019-05-14 (×2): qty 8
  Filled 2019-05-14 (×2): qty 3
  Filled 2019-05-14 (×4): qty 8
  Filled 2019-05-14 (×4): qty 3
  Filled 2019-05-14: qty 8
  Filled 2019-05-14: qty 3

## 2019-05-14 MED ORDER — LABETALOL HCL 5 MG/ML IV SOLN: 10.0000 mg | INTRAVENOUS | Status: DC | PRN

## 2019-05-14 MED ORDER — VANCOMYCIN HCL 750 MG/150ML IV SOLN: 750.0000 mg | Freq: Two times a day (BID) | INTRAVENOUS | Status: DC

## 2019-05-14 MED ORDER — ASPIRIN 300 MG RE SUPP
300.0000 mg | Freq: Every day | RECTAL | Status: DC
  Administered 2019-05-14 – 2019-05-16 (×3): 300 mg via RECTAL
  Filled 2019-05-14 (×6): qty 1

## 2019-05-14 MED ORDER — LEVETIRACETAM IN NACL 1500 MG/100ML IV SOLN
1500.0000 mg | Freq: Two times a day (BID) | INTRAVENOUS | Status: DC
  Administered 2019-05-14 – 2019-05-17 (×6): 1500 mg via INTRAVENOUS
  Filled 2019-05-14 (×9): qty 100

## 2019-05-14 MED ORDER — ENOXAPARIN SODIUM 40 MG/0.4ML ~~LOC~~ SOLN
40.0000 mg | SUBCUTANEOUS | Status: DC
  Administered 2019-05-14 – 2019-05-17 (×4): 40 mg via SUBCUTANEOUS
  Filled 2019-05-14 (×4): qty 0.4

## 2019-05-14 MED ORDER — POLYVINYL ALCOHOL 1.4 % OP SOLN
1.0000 [drp] | Freq: Two times a day (BID) | OPHTHALMIC | Status: DC
  Administered 2019-05-15 – 2019-05-17 (×5): 1 [drp] via OPHTHALMIC
  Filled 2019-05-14: qty 15

## 2019-05-14 MED ORDER — SODIUM CHLORIDE 0.9 % IV SOLN
2.0000 g | Freq: Once | INTRAVENOUS | Status: AC
  Administered 2019-05-14: 16:00:00 2 g via INTRAVENOUS
  Filled 2019-05-14: qty 2

## 2019-05-14 MED ORDER — SODIUM CHLORIDE 0.9 % IV SOLN
3.0000 g | Freq: Four times a day (QID) | INTRAVENOUS | Status: DC
  Filled 2019-05-14 (×3): qty 8

## 2019-05-14 MED ORDER — SODIUM CHLORIDE 0.9 % IV SOLN: INTRAVENOUS | Status: DC

## 2019-05-14 MED ORDER — ALBUTEROL SULFATE (2.5 MG/3ML) 0.083% IN NEBU: 2.5000 mg | INHALATION_SOLUTION | Freq: Four times a day (QID) | RESPIRATORY_TRACT | Status: DC | PRN

## 2019-05-14 MED ORDER — VALPROIC ACID 250 MG/5ML PO SOLN
750.0000 mg | Freq: Three times a day (TID) | ORAL | Status: DC
  Administered 2019-05-14 – 2019-05-15 (×3): 750 mg via ORAL
  Filled 2019-05-14 (×5): qty 15

## 2019-05-14 MED ORDER — VANCOMYCIN HCL 1250 MG/250ML IV SOLN
1250.0000 mg | Freq: Once | INTRAVENOUS | Status: AC
  Administered 2019-05-14: 17:00:00 1250 mg via INTRAVENOUS
  Filled 2019-05-14: qty 250

## 2019-05-14 MED ORDER — LACOSAMIDE 50 MG PO TABS
300.0000 mg | ORAL_TABLET | Freq: Two times a day (BID) | ORAL | Status: DC
  Administered 2019-05-15: 300 mg via ORAL
  Filled 2019-05-14: qty 6

## 2019-05-14 MED ORDER — SODIUM CHLORIDE 0.9 % IV SOLN: 2.0000 g | Freq: Once | INTRAVENOUS | Status: DC

## 2019-05-14 MED ORDER — SODIUM CHLORIDE 0.9 % IV SOLN: 2.0000 g | Freq: Three times a day (TID) | INTRAVENOUS | Status: DC

## 2019-05-14 MED ORDER — PANTOPRAZOLE SODIUM 40 MG IV SOLR
40.0000 mg | INTRAVENOUS | Status: DC
  Administered 2019-05-14 – 2019-05-16 (×3): 40 mg via INTRAVENOUS
  Filled 2019-05-14 (×3): qty 40

## 2019-05-14 MED ORDER — DOCUSATE SODIUM 283 MG RE ENEM
1.0000 | ENEMA | Freq: Every day | RECTAL | Status: DC | PRN
  Filled 2019-05-14: qty 1

## 2019-05-14 MED ORDER — ASPIRIN 81 MG PO CHEW: 81.0000 mg | CHEWABLE_TABLET | Freq: Every day | ORAL | Status: DC

## 2019-05-14 NOTE — ED Notes (Signed)
Admitting MD at bedside.

## 2019-05-14 NOTE — Progress Notes (Signed)
Pharmacy Antibiotic Note  Lee [name] is a 69 y.o. male admitted on 05/14/2019 with pneumonia.  Pharmacy has been consulted for vancomycin and cefepime dosing.  Plan: Vancomycin 1250mg  IV x 1, then 750mg  IV every 12 hours (nomogram dosing d/t reagent shortage) Cefepime 2g IV every 8 hours Add MRSA PCR Monitor renal function, Cx/PCR to narrow Vancomycin trough as needed      Temp (24hrs), Avg:97.2 F (36.2 C), Min:97.2 F (36.2 C), Max:97.2 F (36.2 C)  Recent Labs  Lab 05/14/19 1138 05/14/19 1316  WBC 12.5*  --   CREATININE  --  0.76    CrCl cannot be calculated (Unknown ideal weight.).    Allergies  Allergen Reactions  . Orange Juice [Orange Oil] Diarrhea  . Vicodin [Hydrocodone-Acetaminophen] Other (See Comments)    "triggered seizure both here and at home when he tried to take it" (01/22/2012)  . Penicillins Nausea And Vomiting    Tolerates Zosyn and Unasyn. Has patient had a PCN reaction causing immediate rash, facial/tongue/throat swelling, SOB or lightheadedness with hypotension: no Has patient had a PCN reaction causing severe rash involving mucus membranes or skin necrosis: no Has patient had a PCN reaction that required hospitalization: no Has patient had a PCN reaction occurring within the last 10 years: no If all of the above answers are "NO", then may proceed with Cephalosporin u  . Oxycodone     Causes Seizures    07/14/19, PharmD Clinical Pharmacist ED Pharmacist Phone # [phone] 05/14/2019 2:40 PM

## 2019-05-14 NOTE — ED Triage Notes (Signed)
Pt here via EMS from [facility] for shortness of breath, O2 sats 90% on room air at facility. Improvement with 3L. Pt normally talkative, now reactive to painful stimuli only. Rales heard in all fields. LSN [id number].

## 2019-05-14 NOTE — ED Notes (Signed)
Mepilex applied to sacram
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
## 2019-05-15 ENCOUNTER — Inpatient Hospital Stay (HOSPITAL_COMMUNITY): Payer: Medicare Other

## 2019-05-15 DIAGNOSIS — J189 Pneumonia, unspecified organism: Secondary | ICD-10-CM | POA: Diagnosis not present

## 2019-05-15 DIAGNOSIS — R4182 Altered mental status, unspecified: Secondary | ICD-10-CM

## 2019-05-15 LAB — BASIC METABOLIC PANEL
Anion gap: 14 (ref 5–15)
BUN: 12 mg/dL (ref 8–23)
CO2: 21 mmol/L — ABNORMAL LOW (ref 22–32)
Calcium: 8.8 mg/dL — ABNORMAL LOW (ref 8.9–10.3)
Chloride: 104 mmol/L (ref 98–111)
Creatinine, Ser: 0.67 mg/dL (ref 0.61–1.24)
GFR calc Af Amer: >60 mL/min (ref >60)
GFR calc non Af Amer: >60 mL/min (ref >60)
Glucose, Bld: 76 mg/dL (ref 70–99)
Potassium: 4.6 mmol/L (ref 3.5–5.1)
Sodium: 139 mmol/L (ref 135–145)

## 2019-05-15 LAB — CBC
HCT: 41.8 % (ref 39.0–52.0)
Hemoglobin: 13.8 g/dL (ref 13.0–17.0)
MCH: 32.9 pg (ref 26.0–34.0)
MCHC: 33 g/dL (ref 30.0–36.0)
MCV: 99.8 fL (ref 80.0–100.0)
Platelets: 367 10*3/uL (ref 150–400)
RBC: 4.19 MIL/uL — ABNORMAL LOW (ref 4.22–5.81)
RDW: 15.2 % (ref 11.5–15.5)
WBC: 9.4 10*3/uL (ref 4.0–10.5)
nRBC: 0 % (ref 0.0–0.2)

## 2019-05-15 LAB — AMMONIA: Ammonia: 22 umol/L (ref 9–35)

## 2019-05-15 LAB — TSH: TSH: 0.765 u[IU]/mL (ref 0.350–4.500)

## 2019-05-15 LAB — MRSA PCR SCREENING: MRSA by PCR: NEGATIVE

## 2019-05-15 LAB — MAGNESIUM: Magnesium: 1.8 mg/dL (ref 1.7–2.4)

## 2019-05-15 MED ORDER — MAGNESIUM SULFATE 2 GM/50ML IV SOLN
2.0000 g | Freq: Once | INTRAVENOUS | Status: AC
  Administered 2019-05-15: 2 g via INTRAVENOUS
  Filled 2019-05-15: qty 50

## 2019-05-15 MED ORDER — SODIUM CHLORIDE 0.9 % IV SOLN: INTRAVENOUS | Status: DC

## 2019-05-15 MED ORDER — CHLORHEXIDINE GLUCONATE CLOTH 2 % EX PADS
6.0000 | MEDICATED_PAD | Freq: Once | CUTANEOUS | Status: AC
  Administered 2019-05-15: 6 via TOPICAL

## 2019-05-15 MED ORDER — CLONAZEPAM 0.25 MG PO TBDP
0.2500 mg | ORAL_TABLET | Freq: Two times a day (BID) | ORAL | Status: DC
  Administered 2019-05-15 – 2019-05-17 (×4): 0.25 mg via ORAL
  Filled 2019-05-15 (×4): qty 1

## 2019-05-15 MED ORDER — ACETAMINOPHEN 10 MG/ML IV SOLN
1000.0000 mg | Freq: Four times a day (QID) | INTRAVENOUS | Status: DC | PRN
  Filled 2019-05-15: qty 100

## 2019-05-15 MED ORDER — SODIUM CHLORIDE 0.9 % IV SOLN
300.0000 mg | Freq: Two times a day (BID) | INTRAVENOUS | Status: DC
  Administered 2019-05-15 – 2019-05-17 (×4): 300 mg via INTRAVENOUS
  Filled 2019-05-15 (×5): qty 30

## 2019-05-15 MED ORDER — CHLORHEXIDINE GLUCONATE CLOTH 2 % EX PADS
6.0000 | MEDICATED_PAD | Freq: Every day | CUTANEOUS | Status: DC
  Administered 2019-05-16 – 2019-05-17 (×2): 6 via TOPICAL

## 2019-05-15 MED ORDER — VALPROATE SODIUM 500 MG/5ML IV SOLN
750.0000 mg | Freq: Three times a day (TID) | INTRAVENOUS | Status: DC
  Administered 2019-05-15 – 2019-05-17 (×5): 750 mg via INTRAVENOUS
  Filled 2019-05-15 (×8): qty 7.5

## 2019-05-15 MED ORDER — ACETAMINOPHEN 325 MG PO TABS
650.0000 mg | ORAL_TABLET | Freq: Four times a day (QID) | ORAL | Status: DC | PRN
  Administered 2019-05-15: 650 mg via ORAL
  Filled 2019-05-15: qty 2

## 2019-05-15 MED ORDER — CLONAZEPAM 0.5 MG PO TABS: 0.2500 mg | ORAL_TABLET | Freq: Two times a day (BID) | ORAL | Status: DC

## 2019-05-15 NOTE — Progress Notes (Signed)
Pt not a good historian, therefore admission documentation not complete. 

## 2019-05-15 NOTE — ED Notes (Signed)
Attempted to call report, no answer

## 2019-05-15 NOTE — Progress Notes (Signed)
PROGRESS NOTE    Lee [name]  PZW:[id number] DOB: Oct 29, 1950 DOA: 05/14/2019 PCP: Mirna Mires, MD   Brief Narrative: 69 year old with past medical history significant for seizure disorder, CVA, dysphagia on pured diet, COPD, advanced dementia, history of alcohol use, hypertension brought to the ED due to shortness of breath and altered mental status.  Patient was found to have pulse oximeter 90%, patient became lethargic and sleepy, which is not patient baseline. Evaluation in the ED CT chest showed bilateral lower field infiltrate.  CT head negative for acute findings.  COVID-19 negative.  White blood cell 12.5.    Assessment & Plan:   Active Problems:   Multifocal pneumonia   1-Multifocal pneumonia: Concern with aspiration pneumonia: Continue with Unasyn. Speech therapy consulted. Continue NPO status  2-Acute metabolic encephalopathy Patient presented with altered mental status, worsening lethargic. EEG ordered. Suspect related to infectious process.   3-Hypertension: BP soft, hold BP meds.   4-Seizure disorder: Continue with IV Keppra, Vimpat and Depakote/.  Resume schedule klonopin, to avoid withdrawal.  Follow EEG.  Advanced dementia: Palliative care consulted.                                                      Pressure Injury 05/15/19 Buttocks Right Stage 3 -  Full thickness tissue loss. Subcutaneous fat may be visible but bone, tendon or muscle are NOT exposed. (Active)  05/15/19 0100  Location: Buttocks  Location Orientation: Right  Staging: Stage 3 -  Full thickness tissue loss. Subcutaneous fat may be visible but bone, tendon or muscle are NOT exposed.  Wound Description (Comments):   Present on Admission: Yes     Pressure Injury 05/15/19 Heel Right;Left Stage 1 -  Intact skin with non-blanchable redness of a localized area usually over a bony prominence. (Active)  05/15/19 0100  Location: Heel  Location Orientation:  Right;Left  Staging: Stage 1 -  Intact skin with non-blanchable redness of a localized area usually over a bony prominence.  Wound Description (Comments):   Present on Admission: Yes                  Estimated body mass index is 19.42 kg/m as calculated from the following:   Height as of 03/29/19: 5\' 10"  (1.778 m).   Weight as of 04/03/19: 61.4 kg.   DVT prophylaxis: Lovenox Code Status: Full code Family Communication: Wife at bedside Disposition Plan:  Patient is from: SNF Anticipated d/c date: 2-3 days to SNF Anticipated discharge to; SNF Barriers to d/c or necessity for inpatient status: remain encephalopathic, Not tolerating oral  Consultants:   Palliative Care  Procedures:   EEG  Antimicrobials:  Unasyn  Subjective: Keep eyes close, Moaning, saying " let me go"  Report generalized pain   Objective: Vitals:   05/14/19 2215 05/14/19 2300 05/15/19 0000 05/15/19 0056  BP: 133/69 (!) 148/75 115/71 (!) 104/53  Pulse: 78 68 74 74  Resp: (!) 21 17 20 18   Temp:   98.3 F (36.8 C) 98.4 F (36.9 C)  TempSrc:   Oral Oral  SpO2: 100% 100% 100% 98%    Intake/Output Summary (Last 24 hours) at 05/15/2019 0729 Last data filed at 05/15/2019 0348 Gross per 24 hour  Intake 438.46 ml  Output --  Net 438.46 ml   There were no vitals filed for  this visit.  Examination:  General exam: Appears calm and comfortable  Respiratory system: Bilateral ronchus Cardiovascular system: S1 & S2 heard, RRR. [name] Gastrointestinal system: Abdomen is nondistended, soft and nontender. No organomegaly or masses felt. Normal bowel sounds heard. Central nervous system: Lethargic, not following command. Said few words Extremities: no edema  Data Reviewed: I have personally reviewed following labs and imaging studies  CBC: Recent Labs  Lab 05/14/19 1138  WBC 12.5*  NEUTROABS 10.2*  HGB 14.1  HCT 43.4  MCV 101.4*  PLT 408*   Basic Metabolic Panel: Recent Labs  Lab  05/14/19 1316 05/15/19 0249  NA 139 139  K 4.6 4.6  CL 101 104  CO2 28 21*  GLUCOSE 102* 76  BUN 10 12  CREATININE 0.76 0.67  CALCIUM 9.0 8.8*   GFR: CrCl cannot be calculated (Unknown ideal weight.). Liver Function Tests: No results for input(s): AST, ALT, ALKPHOS, BILITOT, PROT, ALBUMIN in the last 168 hours. No results for input(s): LIPASE, AMYLASE in the last 168 hours. No results for input(s): AMMONIA in the last 168 hours. Coagulation Profile: No results for input(s): INR, PROTIME in the last 168 hours. Cardiac Enzymes: No results for input(s): CKTOTAL, CKMB, CKMBINDEX, TROPONINI in the last 168 hours. BNP (last 3 results) No results for input(s): PROBNP in the last 8760 hours. HbA1C: No results for input(s): HGBA1C in the last 72 hours. CBG: Recent Labs  Lab 05/14/19 1050  GLUCAP 102*   Lipid Profile: No results for input(s): CHOL, HDL, LDLCALC, TRIG, CHOLHDL, LDLDIRECT in the last 72 hours. Thyroid Function Tests: No results for input(s): TSH, T4TOTAL, FREET4, T3FREE, THYROIDAB in the last 72 hours. Anemia Panel: No results for input(s): VITAMINB12, FOLATE, FERRITIN, TIBC, IRON, RETICCTPCT in the last 72 hours. Sepsis Labs: No results for input(s): PROCALCITON, LATICACIDVEN in the last 168 hours.  Recent Results (from the past 240 hour(s))  Respiratory Panel by RT PCR (Flu A&B, Covid) - Nasopharyngeal Swab     Status: None   Collection Time: 05/14/19 11:47 AM   Specimen: Nasopharyngeal Swab  Result Value Ref Range Status   SARS Coronavirus 2 by RT PCR NEGATIVE NEGATIVE Final    Comment: (NOTE) SARS-CoV-2 target nucleic acids are NOT DETECTED. The SARS-CoV-2 RNA is generally detectable in upper respiratoy specimens during the acute phase of infection. The lowest concentration of SARS-CoV-2 viral copies this assay can detect is 131 copies/mL. A negative result does not preclude SARS-Cov-2 infection and should not be used as the sole basis for treatment  or other patient management decisions. A negative result may occur with  improper specimen collection/handling, submission of specimen other than nasopharyngeal swab, presence of viral mutation(s) within the areas targeted by this assay, and inadequate number of viral copies (<131 copies/mL). A negative result must be combined with clinical observations, patient history, and epidemiological information. The expected result is Negative. Fact Sheet for Patients:  https://www.moore.com/ Fact Sheet for Healthcare Providers:  https://www.young.biz/ This test is not yet ap proved or cleared by the [place] FDA and  has been authorized for detection and/or diagnosis of SARS-CoV-2 by FDA under an Emergency Use Authorization (EUA). This EUA will remain  in effect (meaning this test can be used) for the duration of the COVID-19 declaration under Section 564(b)(1) of the Act, 21 U.S.C. section 360bbb-3(b)(1), unless the authorization is terminated or revoked sooner.    Influenza A by PCR NEGATIVE NEGATIVE Final   Influenza B by PCR NEGATIVE NEGATIVE Final    Comment: (  NOTE) The Xpert Xpress SARS-CoV-2/FLU/RSV assay is intended as an aid in  the diagnosis of influenza from Nasopharyngeal swab specimens and  should not be used as a sole basis for treatment. Nasal washings and  aspirates are unacceptable for Xpert Xpress SARS-CoV-2/FLU/RSV  testing. Fact Sheet for Patients: PinkCheek.be Fact Sheet for Healthcare Providers: GravelBags.it This test is not yet approved or cleared by the [place] FDA and  has been authorized for detection and/or diagnosis of SARS-CoV-2 by  FDA under an Emergency Use Authorization (EUA). This EUA will remain  in effect (meaning this test can be used) for the duration of the  Covid-19 declaration under Section 564(b)(1) of the Act, 21  U.S.C. section  360bbb-3(b)(1), unless the authorization is  terminated or revoked. Performed at [facility], [place] [address], [place], [place] [address]   MRSA PCR Screening     Status: None   Collection Time: 05/15/19  1:40 AM   Specimen: Nasopharyngeal  Result Value Ref Range Status   MRSA by PCR NEGATIVE NEGATIVE Final    Comment:        The GeneXpert MRSA Assay (FDA approved for NASAL specimens only), is one component of a comprehensive MRSA colonization surveillance program. It is not intended to diagnose MRSA infection nor to guide or monitor treatment for MRSA infections. Performed at [facility], [place] [address], [place], [place] [address]          Radiology Studies: CT Head Wo Contrast  Result Date: 05/14/2019 CLINICAL DATA:  Altered mental status (AMS), unclear cause. EXAM: CT HEAD WITHOUT CONTRAST TECHNIQUE: Contiguous axial images were obtained from the base of the skull through the vertex without intravenous contrast. COMPARISON:  Head CT 08/07/2020 FINDINGS: Brain: Redemonstrated small chronic lacunar infarcts within the right thalamus and left cerebellar hemisphere. Stable ill-defined hypoattenuation within the cerebral white matter which is nonspecific, but consistent with chronic small vessel ischemic disease. Unchanged moderate generalized parenchymal atrophy. There is no acute intracranial hemorrhage. No demarcated cortical infarct. No extra-axial fluid collection. No evidence of intracranial mass. No midline shift. Vascular: No hyperdense vessel.  Atherosclerotic calcifications. Skull: Normal. Negative for fracture or focal lesion. Sinuses/Orbits: Visualized orbits show no acute finding. No significant paranasal sinus disease at the imaged levels. Left mastoid effusion. IMPRESSION: 1. No evidence of acute intracranial abnormality. 2. Stable chronic small vessel ischemic disease with redemonstrated chronic right thalamic and left cerebellar lacunar infarcts. 3. Stable  moderate generalized parenchymal atrophy. 4. Left mastoid effusion. Electronically Signed   By: Kellie Simmering DO   On: 05/14/2019 14:13   CT Chest Wo Contrast  Result Date: 05/14/2019 CLINICAL DATA:  Hypoxia EXAM: CT CHEST WITHOUT CONTRAST TECHNIQUE: Multidetector CT imaging of the chest was performed following the standard protocol without IV contrast. COMPARISON:  Chest radiograph March 29, 2019 FINDINGS: Cardiovascular: There is no thoracic aortic aneurysm. There are scattered foci of calcification in visualized great vessels. There are foci of aortic atherosclerosis. Multiple foci of coronary artery calcification noted. There is no pericardial effusion or pericardial thickening. Mediastinum/Nodes: Thyroid appears normal. There are subcentimeter mediastinal lymph nodes. There is no adenopathy in the thoracic region by size criteria. No esophageal lesions are evident. Lungs/Pleura: There is underlying centrilobular emphysematous change. There are multiple areas of patchy airspace opacity consistent with multifocal pneumonia throughout both lower lobes with multiple tree on bud type opacities, particularly on the right, again consistent with pneumonia. Subtle areas of patchy opacity noted in the right upper lobe, likely  due to small foci of peripheral pneumonia in the right upper lobe. There is mild consolidation in the posterior lung bases bilaterally. No associated pleural effusions. There is a degree of lower lobe bronchiectatic change. There is mild lobular septal thickening in the lung bases as well. Upper Abdomen: In the visualized upper abdomen, there is aortic and proximal major mesenteric arterial vascular calcification at several sites. There are probable vascular calcifications in the periphery of the left kidney. There is an apparent cyst in the upper pole left kidney measuring 1.1 x 1.0 cm. Visualized upper abdominal structures otherwise appear unremarkable. Musculoskeletal: There is mild anterior  wedging of the T9 vertebral body. No blastic or lytic bone lesions. No evident chest wall lesions. IMPRESSION: 1. Bilateral lower extremity pneumonia with multiple foci of airspace opacity, somewhat more on the right than on the left. Consolidation in each posterior lung base. Subtle patchy infiltrate noted in the periphery of the right upper lobe. 2. Underlying centrilobular emphysematous change. There is lower lobe bronchiectatic change as well as mild basilar lobular thickening on each side. There may be a degree of usual interstitial pneumonitis developing in the lung bases superimposed on the emphysema. 3.  No evident adenopathy. 4. Aortic atherosclerosis as well as foci of great vessel and major mesenteric arterial vascular calcification. Multiple foci of coronary artery calcification noted. Aortic Atherosclerosis (ICD10-I70.0) and Emphysema (ICD10-J43.9). Electronically Signed   By: Bretta Bang III M.D.   On: 05/14/2019 14:12        Scheduled Meds:  aspirin  300 mg Rectal Daily   [START ON 05/16/2019] Chlorhexidine Gluconate Cloth  6 each Topical Q0600   enoxaparin (LOVENOX) injection  40 mg Subcutaneous Q24H   lacosamide  300 mg Oral BID   pantoprazole (PROTONIX) IV  40 mg Intravenous Q24H   polyvinyl alcohol  1 drop Left Eye BID   valproic acid  750 mg Oral TID   Continuous Infusions:  sodium chloride 100 mL/hr at 05/14/19 1902   ampicillin-sulbactam (UNASYN) IV 3 g (05/15/19 0415)   levETIRAcetam Stopped (05/14/19 2235)     LOS: 1 day    Time spent: 35 minutes.     Alba Cory, MD [facility]   If 7PM-7AM, please contact night-coverage [email or web address]  05/15/2019, 7:29 AM

## 2019-05-15 NOTE — Plan of Care (Signed)

## 2019-05-15 NOTE — Consult Note (Signed)
  WOC Nurse Consult Note: Reason for Consult: Consult requested for right buttock Wound type: Chronic stage 3 pressure injury to right buttock Pressure Injury POA: Yes Measurement: .8X.8X.2cm Wound bed: red and moist Drainage (amount, consistency, odor) small amt tan drainage, no odor Periwound: intact skin surrounding Dressing procedure/placement/frequency: Topical treatment orders provided for bedside nurses to protect and promote healing as follows: Foam dressing to right buttock, change Q 3 days or PRN soiling: Foam dressing to right buttock, change Q 3 days or PRN soiling. Please re-consult if further assistance is needed.  Thank-you,  Cammie Mcgee MSN, RN, CWOCN, [place], CNS [phone]
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
## 2019-05-15 NOTE — Evaluation (Signed)
Physical Therapy Evaluation Patient Details Name: Lee [name] MRN: [id number] DOB: 06/21/1950 Today's Date: 05/15/2019   History of Present Illness  69 year old with past medical history significant for seizure disorder, CVA, dysphagia on pured diet, COPD, advanced dementia, history of alcohol use, hypertension brought to the ED due to shortness of breath and altered mental status.  Felt to have multifocal pneumonia.  Clinical Impression  Patient is from [facility] where he is long term resident.  He presents likely close to his functional baseline needing total A for all mobility.  He would benefit from restorative nursing program to help manage his spasticity for improved comfort and reducing risk of skin breakdown.  Feel no further skilled PT needs at this time.  PT to sign off.     Follow Up Recommendations SNF    Equipment Recommendations  None recommended by PT    Recommendations for Other Services       Precautions / Restrictions Precautions Precautions: Fall Precaution Comments: on contact isolation      Mobility  Bed Mobility Overal bed mobility: Needs Assistance Bed Mobility: Rolling;Sidelying to Sit;Sit to Supine Rolling: Max assist Sidelying to sit: Total assist   Sit to supine: Total assist   General bed mobility comments: cues for sequencing rolling with assist for flexing knees and turning wtih pad under him, increased time for him to comply to turn head to the side rolling toward; assist for legs off bed and lifting trunk.  Assist for both as well to supine and to scoot up in bed  Transfers                    Ambulation/Gait                Stairs            Wheelchair Mobility    Modified Rankin (Stroke Patients Only)       Balance Overall balance assessment: Needs assistance   Sitting balance-Leahy Scale: Zero Sitting balance - Comments: max to mod A for sitting at EOB pt with posterior bias, work to flex at hips in  sitting and for trunk rotation stretch                                     Pertinent Vitals/Pain Pain Assessment: Faces Faces Pain Scale: Hurts even more Pain Location: R shoulder and generalized Pain Descriptors / Indicators: Grimacing;Moaning;Guarding Pain Intervention(s): Monitored during session;Repositioned;Other (comment)(ROM)    Home Living Family/patient expects to be discharged to:: Skilled nursing facility                 Additional Comments: [place], pt reports walking with walker with assist,but prior notes indicate he was total care    Prior Function Level of Independence: Needs assistance               Hand Dominance        Extremity/Trunk Assessment   Upper Extremity Assessment Upper Extremity Assessment: RUE deficits/detail;LUE deficits/detail RUE Deficits / Details: AAROM/PROM limited shoulder elevation to about 45, strength 3/5 elbow and hand, not lifting shoulder antigravity LUE Deficits / Details: L hand with some spastic deformities, but moving L UE more feely than R    Lower Extremity Assessment Lower Extremity Assessment: LLE deficits/detail;RLE deficits/detail RLE Deficits / Details: wearing knee extension splint in supine, removed for ROM and mobility testing, pt with stiffness in  both flexion and extension with increased tone throughout LE's 3/4 on modifed ashworth scale, noted tight heel cords, no active movement of LE's noted LLE Deficits / Details: note foot in equinus position with toes curled down, stiffness throughout with spasticity 3/4 on modified ashworth scale    Cervical / Trunk Assessment Cervical / Trunk Assessment: Other exceptions Cervical / Trunk Exceptions: stiffness throughout trunk noted in sitting as well  Communication      Cognition Arousal/Alertness: Awake/alert Behavior During Therapy: Anxious Overall Cognitive Status: No family/caregiver present to determine baseline cognitive functioning                                  General Comments: likely at his baseline, holding R shoulder and crying out in pain asking for help to stretch, reports was ambulatory, but notes from prior admission indicate total A      General Comments General comments (skin integrity, edema, etc.): noted heel breakdown initially wearing prevlon boots and R knee splint, left off both at end of session opting for long wise pillow under both legs with towel roll to prevent heel breakdown; NT aware    Exercises Other Exercises Other Exercises: performed PROM; general stretching of R UE; bilat LE's   Assessment/Plan    PT Assessment Patent does not need any further PT services  PT Problem List         PT Treatment Interventions      PT Goals (Current goals can be found in the Care Plan section)  Acute Rehab PT Goals PT Goal Formulation: All assessment and education complete, DC therapy    Frequency     Barriers to discharge        Co-evaluation               AM-PAC PT "6 Clicks" Mobility  Outcome Measure Help needed turning from your back to your side while in a flat bed without using bedrails?: Total Help needed moving from lying on your back to sitting on the side of a flat bed without using bedrails?: Total Help needed moving to and from a bed to a chair (including a wheelchair)?: Total Help needed standing up from a chair using your arms (e.g., wheelchair or bedside chair)?: Total Help needed to walk in hospital room?: Total Help needed climbing 3-5 steps with a railing? : Total 6 Click Score: 6    End of Session   Activity Tolerance: Patient tolerated treatment well Patient left: in bed;with call bell/phone within reach;with bed alarm set Nurse Communication: Mobility status PT Visit Diagnosis: Other abnormalities of gait and mobility (R26.89)    Time: 3846-6599 PT Time Calculation (min) (ACUTE ONLY): 33 min   Charges:   PT Evaluation $PT Eval Moderate  Complexity: 1 Mod PT Treatments $Therapeutic Activity: 8-22 mins        Sheran Lawless, PT Acute Rehabilitation Services [phone] 05/15/2019   Elray Mcgregor 05/15/2019, 5:34 PM

## 2019-05-15 NOTE — Progress Notes (Signed)
EEG completed, results pending. 

## 2019-05-15 NOTE — Procedures (Addendum)
Patient Name: Lee [name]  MRN: [id number]  Epilepsy Attending: Charlsie Quest  Referring Physician/Provider: Dr. Hartley Barefoot Date: 05/15/2019 Duration: 24.07 mins  Patient history: 69 year old male with history of seizure who presented with altered mental status, worsening lethargy.  EEG to evaluate for seizures.  Level of alertness: Awake, asleep  AEDs during EEG study: Lacosamide, Depakote  Technical aspects: This EEG study was done with scalp electrodes positioned according to the 10-20 International system of electrode placement. Electrical activity was acquired at a sampling rate of 500Hz  and reviewed with a high frequency filter of 70Hz  and a low frequency filter of 1Hz . EEG data were recorded continuously and digitally stored.   Description: During awake state, no clear posterior dominant was seen.  Sleep was characterized by vertex waves, maximal frontocentral region.  EEG showed continuous generalized 5 to 6 Hz theta slowing. Hyperventilation and photic stimulation were not performed.  Abnormality -Continuous slow, generalized  IMPRESSION: This study is suggestive of moderate diffuse encephalopathy, nonspecific to etiology. No seizures or epileptiform discharges were seen throughout the recording.  Emberleigh Reily 

## 2019-05-16 DIAGNOSIS — Z66 Do not resuscitate: Secondary | ICD-10-CM

## 2019-05-16 DIAGNOSIS — Z7189 Other specified counseling: Secondary | ICD-10-CM | POA: Diagnosis not present

## 2019-05-16 DIAGNOSIS — Z515 Encounter for palliative care: Secondary | ICD-10-CM | POA: Diagnosis not present

## 2019-05-16 DIAGNOSIS — J189 Pneumonia, unspecified organism: Secondary | ICD-10-CM | POA: Diagnosis not present

## 2019-05-16 LAB — CBC
HCT: 37.9 % — ABNORMAL LOW (ref 39.0–52.0)
Hemoglobin: 12.5 g/dL — ABNORMAL LOW (ref 13.0–17.0)
MCH: 32.6 pg (ref 26.0–34.0)
MCHC: 33 g/dL (ref 30.0–36.0)
MCV: 99 fL (ref 80.0–100.0)
Platelets: 331 10*3/uL (ref 150–400)
RBC: 3.83 MIL/uL — ABNORMAL LOW (ref 4.22–5.81)
RDW: 14.9 % (ref 11.5–15.5)
WBC: 7.2 10*3/uL (ref 4.0–10.5)
nRBC: 0 % (ref 0.0–0.2)

## 2019-05-16 LAB — BASIC METABOLIC PANEL
Anion gap: 13 (ref 5–15)
BUN: 12 mg/dL (ref 8–23)
CO2: 22 mmol/L (ref 22–32)
Calcium: 8.5 mg/dL — ABNORMAL LOW (ref 8.9–10.3)
Chloride: 106 mmol/L (ref 98–111)
Creatinine, Ser: 0.65 mg/dL (ref 0.61–1.24)
GFR calc Af Amer: >60 mL/min (ref >60)
GFR calc non Af Amer: >60 mL/min (ref >60)
Glucose, Bld: 62 mg/dL — ABNORMAL LOW (ref 70–99)
Potassium: 3.7 mmol/L (ref 3.5–5.1)
Sodium: 141 mmol/L (ref 135–145)

## 2019-05-16 LAB — GLUCOSE, CAPILLARY
Glucose-Capillary: 66 mg/dL — ABNORMAL LOW (ref 70–99)
Glucose-Capillary: 82 mg/dL (ref 70–99)

## 2019-05-16 MED ORDER — DEXTROSE IN LACTATED RINGERS 5 % IV SOLN: INTRAVENOUS | Status: DC

## 2019-05-16 NOTE — Progress Notes (Signed)
Speech Language Pathology Treatment: Dysphagia  Patient Details Name: Lee [name] MRN: [id number] DOB: [birth year] Today's Date: 05/16/2019 Time: 2130-8657 SLP Time Calculation (min) (ACUTE ONLY): 26 min  Assessment / Plan / Recommendation Clinical Impression  Pt today seen for po readiness.  He was alert and accepting of po trials.  Voice is dysphonic but not wet/gurgly.  Provided pt with intake of graham crackers, applesauce, cranberry juice, Sprite and water.  No indication of aspiration c/b coughing or throat clearing across all boluses tested.  Pt demonstrated clinically prolonged mastication but was able to fully clear independently.  Of note, pt presents with excessive facial movement/appearance of "masticaton" despite oral cavity being clear - Suspect possible impacts of psychotropic medications - extrapyramidal movement.  Pt able to help self feed today by helping to hold cup, cracker and even spoon.  Recommend dys3/thin diet with full supervision initially.  Hopeful for pt to become strong enough to be able to consume intake independently to help maximize his airway protection with intake. No family present at this time and SlP will follow up to provide education to family/assuring tolerance.  Much improved mentation today compared to yesterday per notes.    HPI HPI: Lee [name] is a 69 y.o. male with medical history significant of seizure disorder, CVA dysphagia on pured diet, COPD, advanced dementia, Hx of alcohol use, HTN brought to the hospital for shortness of breath, pulse ox 90%, lethargy.  CT chest showing bilateral lower field infiltrates.  CT head negative for acute finding. BSE 03/30/19 findings consistent with swallow ability one month prior and DYs 1/ thin recommended (baseline po's).       SLP Plan  Continue with current plan of care       Recommendations  Diet recommendations: Dysphagia 3 (mechanical soft);Thin liquid Liquids provided via:  Cup;Straw Medication Administration: Whole meds with puree Supervision: Staff to assist with self feeding;Full supervision/cueing for compensatory strategies Compensations: Slow rate;Small sips/bites Postural Changes and/or Swallow Maneuvers: Seated upright 90 degrees;Upright 30-60 min after meal                Oral Care Recommendations: Oral care QID Follow up Recommendations: Other (comment)(TBD) SLP Visit Diagnosis: Dysphagia, unspecified (R13.10) Plan: Continue with current plan of care       GO              Rolena Infante, MS [facility] SLP Acute Rehab Services Office [phone]  Chales Abrahams 05/16/2019, 11:53 AM

## 2019-05-16 NOTE — Consult Note (Signed)
Palliative Medicine Inpatient Consult Note  Reason for consult:  Goals of care conversations  MOST on file; wants aggressive care including PEG (completed by wife in 2020). Rx for [name] [employer]; SLP rec NPO 5/7. Dementia, pna, seizure d/o.  HPI:  Per intake H&P --> Lee [name] is a 69 year old with past medical history significant for seizure disorder, CVA, dysphagia on pured diet, COPD, advanced dementia, history of alcohol use, hypertension brought to the ED due to shortness of breath and altered mental status.  Felt to have multifocal pneumonia.  Palliative care was asked to get involved in the setting dysphagia associated with progressive dementia. Patient has been hospitalized three times in the last six months and remains to have recurrent aspiration pneumonias in addition he has a stage III sacral decubitus ulcer. His most recent albumin is 2.4 upon laboratory review.   Clinical Assessment/Goals of Care: I have reviewed medical records including EPIC notes, labs and imaging, received report from bedside RN, assessed the patient.   I called Lee [name] (Spouse) to further discuss diagnosis prognosis, GOC, EOL wishes, disposition and options.   I introduced Palliative Medicine as specialized medical care for people living with serious illness. It focuses on providing relief from the symptoms and stress of a serious illness. The goal is to improve quality of life for both the patient and the family. 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  I asked Lee [name] to tell me about [name]. She shares that [name] in his youth he has remained here since. Lee [name] and [name] have been married for seventeen years, they have no children together. 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 In regards to things he enjoys, he loves music and television, "Law and Order" specifically.   I asked Lee [name] how Lee [name] health had been. She shares that his dementia has seemed to worsen over the last three years. She states that about two and a half years ago his care needs exceeded her ability to care for him which is when he was place in custodial care. She laments on the reality of COVID-19 and how it has dramatically affected Lee [name]. Prior to the pandemic Lee [name] states that she was able to take Lee [name] out to eat and he was walking well with aid.  Since the pandemic though it seems Lee [name] has become more frail. We discussed his recurrent aspiration pneumonias and failure to thrive. A detailed discussion regarding the diagnosis of dementia and its natural trajectory was had.  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    Concepts specific to code status, artifical feeding and hydration, continued IV antibiotics and rehospitalization was had. I shared that on Lee [name] prior MOST form it has said that he would want everything done.   Lee [name] said that this was how he felt years ago when they had discussed this. As of yesterday the patient stated to her "let me  go". Lee [name] feels very conflicted. She shares that she understands the trauma resuscitation can cause as her mother had it and it caused her ribs to fracture. Lee [name] shares that Lee [name] is frail and she would not want to put him through that or intubation.   We reviewed feedings tubes as health care professionals commonly rely on feeding tubes to supply nutrition to these severely demented patients. However, various studies have not shown use of feeding tubes to be effective in preventing malnutrition. Furthermore, they have not been demonstrated to prevent the occurrence  or increase the healing of pressure sores, prevent aspiration pneumonia, provide comfort, improve functional status, or extend life. High complication rates, increased use of restraints, and other adverse effects further increase the burden of feeding tubes in severely demented patients. I recommended against tube feedings which Lee [name] agreed with.   The difference between a aggressive medical intervention path  and a palliative comfort care path for this patient at this time was had. Lee [name] shares that this is all very difficult for her. I told her my concerns in terms of Lee [name] limited prognosis which she stated understanding of. I was able to broach the topic of hospice which ensures dignity and quality at the end of life. Lee [name] plans to speak to Lee [name] brother today.   Offered my phone number in the event that additional support is required.   Discussed the importance of continued conversation with family and their  medical providers regarding overall plan of care and treatment options, ensuring decisions are within the context of the patients values and GOCs.  Decision Maker: [name] (Spouse) [phone]  SUMMARY OF RECOMMENDATIONS   DNAR/DNI, No tube feedings in the setting of advanced dementia  Patients wife, Lee [name] is thinking about hospice. We discussed residential hospice versus SNF hospice though she needs time to process this.   Chaplain consult  Palliative care will remain involved as support to aid in ongoing GOC conversations  Code Status/Advance Care Planning: DNAR/DNI  Palliative Prophylaxis:   Aspiration, Fall precautions, pressure injury prevention  Additional Recommendations (Limitations, Scope, Preferences):  Treat what is treatable   Psycho-social/Spiritual:   Desire for further Chaplaincy support: Yes  Additional Recommendations: Education on hospice   Prognosis:   Poor in the setting of failure to thrive   Discharge Planning: Discharge plan is unclear  at the present time.   PPS: 10%   This conversation/these recommendations were discussed with patient primary care team, Dr. Sunnie Nielsen  Time In: 0830 Time Out: 0940 Total Time: 70 minutes Greater than 50%  of this time was spent counseling and coordinating care related to the above assessment and plan.  Lamarr Lulas [place] Palliative Medicine Team Team Cell Phone: [phone] Please utilize secure chat with additional questions, if there is no response within 30 minutes please call the above phone number  Palliative Medicine Team providers are available by phone from 7am to 7pm daily and can be reached through the team cell phone.  Should this patient require assistance outside of these hours, please call the patient's attending physician.

## 2019-05-16 NOTE — Progress Notes (Signed)
PROGRESS NOTE    Lee [name]  BJY:[id number] DOB: August 08, 1950 DOA: 05/14/2019 PCP: Mirna Mires, MD   Brief Narrative: 69 year old with past medical history significant for seizure disorder, CVA, dysphagia on pured diet, COPD, advanced dementia, history of alcohol use, hypertension brought to the ED due to shortness of breath and altered mental status.  Patient was found to have pulse oximeter 90%, patient became lethargic and sleepy, which is not patient baseline. Evaluation in the ED CT chest showed bilateral lower field infiltrate.  CT head negative for acute findings.  COVID-19 negative.  White blood cell 12.5.    Assessment & Plan:   Active Problems:   Multifocal pneumonia   Palliative care by specialist   Goals of care, counseling/discussion   DNR (do not resuscitate)   1-Multifocal pneumonia: Concern with aspiration pneumonia: Continue with Unasyn. Speech therapy consulted. Continue NPO status WBC normalized.   2-Acute metabolic encephalopathy Patient presented with altered mental status, worsening lethargic. EEG negative for seizure, consistent with metabolic encephalopathy/ . Suspect related to infectious process.  Patient open eyes to voice today, he is more calm, does not appears in pain,. He ask for water..   3-Hypertension: BP soft, hold BP meds.   4-Seizure disorder: Continue with IV Keppra, Vimpat and Depakote/.  Resume schedule klonopin, to avoid withdrawal.  EEG negative for seizure.  Advanced dementia: Palliative care consulted.  Appreciate palliative care team assistance with the care provided to Lee [name]. No Peg tube, DNR/DNI,  I agree with those intervention.   Hypoglycemia; change fluids to D 5 . cbg increase to 85.                                                        Pressure Injury 05/15/19 Buttocks Right Stage 3 -  Full thickness tissue loss. Subcutaneous fat may be visible but bone, tendon or muscle are NOT  exposed. (Active)  05/15/19 0100  Location: Buttocks  Location Orientation: Right  Staging: Stage 3 -  Full thickness tissue loss. Subcutaneous fat may be visible but bone, tendon or muscle are NOT exposed.  Wound Description (Comments):   Present on Admission: Yes     Pressure Injury 05/15/19 Heel Right;Left Stage 1 -  Intact skin with non-blanchable redness of a localized area usually over a bony prominence. (Active)  05/15/19 0100  Location: Heel  Location Orientation: Right;Left  Staging: Stage 1 -  Intact skin with non-blanchable redness of a localized area usually over a bony prominence.  Wound Description (Comments):   Present on Admission: Yes                  Estimated body mass index is 19.42 kg/m as calculated from the following:   Height as of 03/29/19: 5\' 10"  (1.778 m).   Weight as of 04/03/19: 61.4 kg.   DVT prophylaxis: Lovenox Code Status: Full code Family Communication: Wife at bedside Disposition Plan:  Patient is from: SNF Anticipated d/c date: 2-3 days to SNF Anticipated discharge to; SNF Barriers to d/c or necessity for inpatient status: remain encephalopathic, Not tolerating oral , needs speech swallow.   Consultants:   Palliative Care  Procedures:   EEG  Antimicrobials:  Unasyn  Subjective: open eyes to voice, appears more calm. Say few words.   Objective: Vitals:   05/15/19 0733 05/15/19  1616 05/15/19 2100 05/16/19 0747  BP: 119/61 (!) 93/56 105/86 (!) 102/44  Pulse: 67 70 70 69  Resp: 16 16 18 16   Temp: 97.9 F (36.6 C) 98.2 F (36.8 C)    TempSrc: Oral     SpO2: 96% 98% 95%     Intake/Output Summary (Last 24 hours) at 05/16/2019 1044 Last data filed at 05/15/2019 1513 Gross per 24 hour  Intake 193.28 ml  Output --  Net 193.28 ml   There were no vitals filed for this visit.  Examination:  General exam: NAD Respiratory system: No wheezing, bilateral ronchus Cardiovascular system: S 1, S 2 RRR Gastrointestinal  system: BS present, soft, nt Central nervous system: alert, say few words Extremities: no edema  Data Reviewed: I have personally reviewed following labs and imaging studies  CBC: Recent Labs  Lab 05/14/19 1138 05/15/19 0740 05/16/19 0233  WBC 12.5* 9.4 7.2  NEUTROABS 10.2*  --   --   HGB 14.1 13.8 12.5*  HCT 43.4 41.8 37.9*  MCV 101.4* 99.8 99.0  PLT 408* 367 331   Basic Metabolic Panel: Recent Labs  Lab 05/14/19 1316 05/15/19 0249 05/15/19 0740 05/16/19 0233  NA 139 139  --  141  K 4.6 4.6  --  3.7  CL 101 104  --  106  CO2 28 21*  --  22  GLUCOSE 102* 76  --  62*  BUN 10 12  --  12  CREATININE 0.76 0.67  --  0.65  CALCIUM 9.0 8.8*  --  8.5*  MG  --   --  1.8  --    GFR: CrCl cannot be calculated (Unknown ideal weight.). Liver Function Tests: No results for input(s): AST, ALT, ALKPHOS, BILITOT, PROT, ALBUMIN in the last 168 hours. No results for input(s): LIPASE, AMYLASE in the last 168 hours. Recent Labs  Lab 05/15/19 0844  AMMONIA 22   Coagulation Profile: No results for input(s): INR, PROTIME in the last 168 hours. Cardiac Enzymes: No results for input(s): CKTOTAL, CKMB, CKMBINDEX, TROPONINI in the last 168 hours. BNP (last 3 results) No results for input(s): PROBNP in the last 8760 hours. HbA1C: No results for input(s): HGBA1C in the last 72 hours. CBG: Recent Labs  Lab 05/14/19 1050 05/16/19 0743 05/16/19 0809  GLUCAP 102* 66* 82   Lipid Profile: No results for input(s): CHOL, HDL, LDLCALC, TRIG, CHOLHDL, LDLDIRECT in the last 72 hours. Thyroid Function Tests: Recent Labs    05/15/19 0844  TSH 0.765   Anemia Panel: No results for input(s): VITAMINB12, FOLATE, FERRITIN, TIBC, IRON, RETICCTPCT in the last 72 hours. Sepsis Labs: No results for input(s): PROCALCITON, LATICACIDVEN in the last 168 hours.  Recent Results (from the past 240 hour(s))  Respiratory Panel by RT PCR (Flu A&B, Covid) - Nasopharyngeal Swab     Status: None    Collection Time: 05/14/19 11:47 AM   Specimen: Nasopharyngeal Swab  Result Value Ref Range Status   SARS Coronavirus 2 by RT PCR NEGATIVE NEGATIVE Final    Comment: (NOTE) SARS-CoV-2 target nucleic acids are NOT DETECTED. The SARS-CoV-2 RNA is generally detectable in upper respiratoy specimens during the acute phase of infection. The lowest concentration of SARS-CoV-2 viral copies this assay can detect is 131 copies/mL. A negative result does not preclude SARS-Cov-2 infection and should not be used as the sole basis for treatment or other patient management decisions. A negative result may occur with  improper specimen collection/handling, submission of specimen other than nasopharyngeal swab,  presence of viral mutation(s) within the areas targeted by this assay, and inadequate number of viral copies (<131 copies/mL). A negative result must be combined with clinical observations, patient history, and epidemiological information. The expected result is Negative. Fact Sheet for Patients:  https://www.moore.com/ Fact Sheet for Healthcare Providers:  https://www.young.biz/ This test is not yet ap proved or cleared by the [place] FDA and  has been authorized for detection and/or diagnosis of SARS-CoV-2 by FDA under an Emergency Use Authorization (EUA). This EUA will remain  in effect (meaning this test can be used) for the duration of the COVID-19 declaration under Section 564(b)(1) of the Act, 21 U.S.C. section 360bbb-3(b)(1), unless the authorization is terminated or revoked sooner.    Influenza A by PCR NEGATIVE NEGATIVE Final   Influenza B by PCR NEGATIVE NEGATIVE Final    Comment: (NOTE) The Xpert Xpress SARS-CoV-2/FLU/RSV assay is intended as an aid in  the diagnosis of influenza from Nasopharyngeal swab specimens and  should not be used as a sole basis for treatment. Nasal washings and  aspirates are unacceptable for Xpert Xpress  SARS-CoV-2/FLU/RSV  testing. Fact Sheet for Patients: https://www.moore.com/ Fact Sheet for Healthcare Providers: https://www.young.biz/ This test is not yet approved or cleared by the [place] FDA and  has been authorized for detection and/or diagnosis of SARS-CoV-2 by  FDA under an Emergency Use Authorization (EUA). This EUA will remain  in effect (meaning this test can be used) for the duration of the  Covid-19 declaration under Section 564(b)(1) of the Act, 21  U.S.C. section 360bbb-3(b)(1), unless the authorization is  terminated or revoked. Performed at [facility], [address] [place], [place] [address]   MRSA PCR Screening     Status: None   Collection Time: 05/15/19  1:40 AM   Specimen: Nasopharyngeal  Result Value Ref Range Status   MRSA by PCR NEGATIVE NEGATIVE Final    Comment:        The GeneXpert MRSA Assay (FDA approved for NASAL specimens only), is one component of a comprehensive MRSA colonization surveillance program. It is not intended to diagnose MRSA infection nor to guide or monitor treatment for MRSA infections. Performed at [facility], [address], [place], [place] [address]          Radiology Studies: CT Head Wo Contrast  Result Date: 05/14/2019 CLINICAL DATA:  Altered mental status (AMS), unclear cause. EXAM: CT HEAD WITHOUT CONTRAST TECHNIQUE: Contiguous axial images were obtained from the base of the skull through the vertex without intravenous contrast. COMPARISON:  Head CT 08/07/2020 FINDINGS: Brain: Redemonstrated small chronic lacunar infarcts within the right thalamus and left cerebellar hemisphere. Stable ill-defined hypoattenuation within the cerebral white matter which is nonspecific, but consistent with chronic small vessel ischemic disease. Unchanged moderate generalized parenchymal atrophy. There is no acute intracranial hemorrhage. No demarcated cortical infarct. No  extra-axial fluid collection. No evidence of intracranial mass. No midline shift. Vascular: No hyperdense vessel.  Atherosclerotic calcifications. Skull: Normal. Negative for fracture or focal lesion. Sinuses/Orbits: Visualized orbits show no acute finding. No significant paranasal sinus disease at the imaged levels. Left mastoid effusion. IMPRESSION: 1. No evidence of acute intracranial abnormality. 2. Stable chronic small vessel ischemic disease with redemonstrated chronic right thalamic and left cerebellar lacunar infarcts. 3. Stable moderate generalized parenchymal atrophy. 4. Left mastoid effusion. Electronically Signed   By: Jackey Loge DO   On: 05/14/2019 14:13   CT Chest Wo Contrast  Result Date: 05/14/2019 CLINICAL DATA:  Hypoxia EXAM: CT CHEST WITHOUT CONTRAST TECHNIQUE: Multidetector CT imaging of the chest was performed following the standard protocol without IV contrast. COMPARISON:  Chest radiograph March 29, 2019 FINDINGS: Cardiovascular: There is no thoracic aortic aneurysm. There are scattered foci of calcification in visualized great vessels. There are foci of aortic atherosclerosis. Multiple foci of coronary artery calcification noted. There is no pericardial effusion or pericardial thickening. Mediastinum/Nodes: Thyroid appears normal. There are subcentimeter mediastinal lymph nodes. There is no adenopathy in the thoracic region by size criteria. No esophageal lesions are evident. Lungs/Pleura: There is underlying centrilobular emphysematous change. There are multiple areas of patchy airspace opacity consistent with multifocal pneumonia throughout both lower lobes with multiple tree on bud type opacities, particularly on the right, again consistent with pneumonia. Subtle areas of patchy opacity noted in the right upper lobe, likely due to small foci of peripheral pneumonia in the right upper lobe. There is mild consolidation in the posterior lung bases bilaterally. No associated pleural  effusions. There is a degree of lower lobe bronchiectatic change. There is mild lobular septal thickening in the lung bases as well. Upper Abdomen: In the visualized upper abdomen, there is aortic and proximal major mesenteric arterial vascular calcification at several sites. There are probable vascular calcifications in the periphery of the left kidney. There is an apparent cyst in the upper pole left kidney measuring 1.1 x 1.0 cm. Visualized upper abdominal structures otherwise appear unremarkable. Musculoskeletal: There is mild anterior wedging of the T9 vertebral body. No blastic or lytic bone lesions. No evident chest wall lesions. IMPRESSION: 1. Bilateral lower extremity pneumonia with multiple foci of airspace opacity, somewhat more on the right than on the left. Consolidation in each posterior lung base. Subtle patchy infiltrate noted in the periphery of the right upper lobe. 2. Underlying centrilobular emphysematous change. There is lower lobe bronchiectatic change as well as mild basilar lobular thickening on each side. There may be a degree of usual interstitial pneumonitis developing in the lung bases superimposed on the emphysema. 3.  No evident adenopathy. 4. Aortic atherosclerosis as well as foci of great vessel and major mesenteric arterial vascular calcification. Multiple foci of coronary artery calcification noted. Aortic Atherosclerosis (ICD10-I70.0) and Emphysema (ICD10-J43.9). Electronically Signed   By: Bretta Bang III M.D.   On: 05/14/2019 14:12   EEG adult  Result Date: 05/15/2019 Charlsie Quest, MD     05/15/2019  1:10 PM Patient Name: Lee [name] MRN: [id number] Epilepsy Attending: Charlsie Quest Referring Physician/Provider: Dr. Hartley Barefoot Date: 05/15/2019 Duration: 24.07 mins Patient history: 69 year old male with history of seizure who presented with altered mental status, worsening lethargy.  EEG to evaluate for seizures. Level of alertness: Awake, asleep AEDs  during EEG study: Lacosamide, Depakote Technical aspects: This EEG study was done with scalp electrodes positioned according to the 10-20 International system of electrode placement. Electrical activity was acquired at a sampling rate of 500Hz  and reviewed with a high frequency filter of 70Hz  and a low frequency filter of 1Hz . EEG data were recorded continuously and digitally stored. Description: During awake state, no clear posterior dominant was seen.  Sleep was characterized by vertex waves, maximal frontocentral region.  EEG showed continuous generalized 5 to 6 Hz theta slowing. Hyperventilation and photic stimulation were not performed. Abnormality -Continuous slow, generalized IMPRESSION: This study is suggestive of moderate diffuse encephalopathy, nonspecific to etiology. No seizures or epileptiform discharges were seen throughout the recording. Priyanka Annabelle Harman  Scheduled Meds: . aspirin  300 mg Rectal Daily  . Chlorhexidine Gluconate Cloth  6 each Topical Q0600  . clonazepam  0.25 mg Oral BID  . enoxaparin (LOVENOX) injection  40 mg Subcutaneous Q24H  . pantoprazole (PROTONIX) IV  40 mg Intravenous Q24H  . polyvinyl alcohol  1 drop Left Eye BID   Continuous Infusions: . ampicillin-sulbactam (UNASYN) IV 3 g (05/16/19 0451)  . dextrose 5% lactated ringers 75 mL/hr at 05/16/19 0811  . lacosamide (VIMPAT) IV 300 mg (05/16/19 0953)  . levETIRAcetam 1,500 mg (05/16/19 0911)  . valproate sodium 750 mg (05/16/19 0615)     LOS: 2 days    Time spent: 35 minutes.     Alba Cory, MD [facility]   If 7PM-7AM, please contact night-coverage [email or web address]  05/16/2019, 10:44 AM

## 2019-05-17 DIAGNOSIS — Z515 Encounter for palliative care: Secondary | ICD-10-CM | POA: Diagnosis not present

## 2019-05-17 DIAGNOSIS — Z66 Do not resuscitate: Secondary | ICD-10-CM | POA: Diagnosis not present

## 2019-05-17 DIAGNOSIS — Z7189 Other specified counseling: Secondary | ICD-10-CM | POA: Diagnosis not present

## 2019-05-17 DIAGNOSIS — J189 Pneumonia, unspecified organism: Secondary | ICD-10-CM | POA: Diagnosis not present

## 2019-05-17 LAB — BASIC METABOLIC PANEL
Anion gap: 7 (ref 5–15)
BUN: 5 mg/dL — ABNORMAL LOW (ref 8–23)
CO2: 26 mmol/L (ref 22–32)
Calcium: 8.5 mg/dL — ABNORMAL LOW (ref 8.9–10.3)
Chloride: 109 mmol/L (ref 98–111)
Creatinine, Ser: 0.64 mg/dL (ref 0.61–1.24)
GFR calc Af Amer: >60 mL/min (ref >60)
GFR calc non Af Amer: >60 mL/min (ref >60)
Glucose, Bld: 89 mg/dL (ref 70–99)
Potassium: 3.3 mmol/L — ABNORMAL LOW (ref 3.5–5.1)
Sodium: 142 mmol/L (ref 135–145)

## 2019-05-17 LAB — CBC
HCT: 36.2 % — ABNORMAL LOW (ref 39.0–52.0)
Hemoglobin: 11.9 g/dL — ABNORMAL LOW (ref 13.0–17.0)
MCH: 31.8 pg (ref 26.0–34.0)
MCHC: 32.9 g/dL (ref 30.0–36.0)
MCV: 96.8 fL (ref 80.0–100.0)
Platelets: 295 10*3/uL (ref 150–400)
RBC: 3.74 MIL/uL — ABNORMAL LOW (ref 4.22–5.81)
RDW: 14.4 % (ref 11.5–15.5)
WBC: 6.9 10*3/uL (ref 4.0–10.5)
nRBC: 0 % (ref 0.0–0.2)

## 2019-05-17 LAB — GLUCOSE, CAPILLARY: Glucose-Capillary: 88 mg/dL (ref 70–99)

## 2019-05-17 MED ORDER — AMLODIPINE BESYLATE 10 MG PO TABS
10.0000 mg | ORAL_TABLET | Freq: Every day | ORAL | Status: DC
  Administered 2019-05-17: 10 mg via ORAL
  Filled 2019-05-17: qty 1

## 2019-05-17 MED ORDER — AMOXICILLIN-POT CLAVULANATE 875-125 MG PO TABS: 1.0000 | ORAL_TABLET | Freq: Two times a day (BID) | ORAL | 0 refills | Status: AC

## 2019-05-17 MED ORDER — CLONAZEPAM 0.5 MG PO TABS: 0.2500 mg | ORAL_TABLET | Freq: Two times a day (BID) | ORAL | 0 refills | Status: DC

## 2019-05-17 MED ORDER — ASPIRIN 81 MG PO CHEW
81.0000 mg | CHEWABLE_TABLET | Freq: Every day | ORAL | Status: DC
  Administered 2019-05-17: 81 mg via ORAL
  Filled 2019-05-17: qty 1

## 2019-05-17 MED ORDER — CARBAMAZEPINE 100 MG/5ML PO SUSP
300.0000 mg | Freq: Two times a day (BID) | ORAL | Status: DC
  Filled 2019-05-17 (×2): qty 15

## 2019-05-17 NOTE — Progress Notes (Addendum)
Palliative Medicine Inpatient Follow Up Note  Reason for consult:  Goals of care conversations  MOST on file; wants aggressive care including PEG (completed by wife in 2020). Rx for [place]; SLP rec NPO 5/7. Dementia, pna, seizure d/o.  HPI:  Per intake H&P --> Lee [name] is a 69 year old with past medical history significant for seizure disorder, CVA, dysphagia on pured diet, COPD, advanced dementia, history of alcohol use, hypertension brought to the ED due to shortness of breath and altered mental status.  Felt to have multifocal pneumonia.  Palliative care was asked to get involved in the setting dysphagia associated with progressive dementia. Patient has been hospitalized three times in the last six months and remains to have recurrent aspiration pneumonias in addition he has a stage III sacral decubitus ulcer. His most recent albumin is 2.4 upon laboratory review.   Clinical Assessment/Goals of Care: I have reviewed medical records including EPIC notes, labs and imaging, received report from bedside RN, assessed the patient. Patient is bright eyed this morning and answering questions appropriately. He denied pain, nausea, or dizziness.   I called Lee [name] to discuss Lee [name]. Lee [name] shares that she was here yesterday and felt that Lee [name] was appropriate in his responses to her. She shares that she and her niece were able to cut his nails and have a lovely visit. She states that he was eating upon their time together and seemed to be tolerating his diet fairly well. We discussed the difficulty of our conversation from the day prior. She shares that she has talked to Lee [name] brother. They both agree that have outpatient Palliative care follow up at this point in time is appropriate. We discussed how if he declines further the conversation would likely be geared towards hospice. She states understanding of this.   Discussed the  importance of continued conversation with family and their  medical providers regarding overall plan of care and treatment options, ensuring decisions are within the context of the patients values and GOCs.  Decision Maker: Lee [name] (Spouse) [phone]  SUMMARY OF RECOMMENDATIONS   DNAR/DNI, No tube feedings in the setting of advanced dementia  TOC --> Referral for outpatient Palliative care. Patient lives at [facility] consult  Code Status/Advance Care Planning: DNAR/DNI  Palliative Prophylaxis:   Aspiration, Fall precautions, pressure injury prevention  Additional Recommendations (Limitations, Scope, Preferences):  Treat what is treatable   Psycho-social/Spiritual:   Desire for further Chaplaincy support: Yes  Additional Recommendations: Education on hospice   Prognosis:   Patient with advanced dementia and new onset failure to thrive prognosis is poor   Discharge Planning: Discharge back to [facility] place with outpatient Palliative Care follow up  PPS: 30%    This conversation/these recommendations were discussed with patient primary care team, Dr. Tyrell Antonio  Total Time: 25 minutes Greater than 50%  of this time was spent counseling and coordinating care related to the above assessment and plan.  [place] Team Team Cell Phone: [phone] Please utilize secure chat with additional questions, if there is no response within 30 minutes please call the above phone number  Palliative Medicine Team providers are available by phone from 7am to 7pm daily  and can be reached through the team cell phone.  Should this patient require assistance outside of these hours, please call the patient's attending physician.

## 2019-05-17 NOTE — TOC Initial Note (Signed)
Transition of Care [facility]) - Initial/Assessment Note    Patient Details  Name: Lee [name] MRN: [id number] Date of Birth: 1950/05/21  [facility] CM/SW Contact:    Jacquelynn Cree Phone Number: 05/17/2019, 9:57 AM  Clinical Narrative:                 Patient permanent resident of [facility]. CSW spoke with patient's spouse [name], confirmed patient will return to [facility] with palliative following outpatient.  CSW contacted Kristen with [facility], awaiting a callback. TOC team will continue to follow.   Expected Discharge Plan: Skilled Nursing Facility Barriers to Discharge: No Barriers Identified   Patient Goals and CMS Choice   CMS Medicare.gov Compare Post Acute Care list provided to:: Patient Represenative (must comment)([name] Jimmye Norman) Choice offered to / list presented to : Spouse  Expected Discharge Plan and Services Expected Discharge Plan: [place] arrangements for the past 2 months: [place]                                      Prior Living Arrangements/Services Living arrangements for the past 2 months: [place] Lives with:: Facility Resident Patient language and need for interpreter reviewed:: Yes        Need for Family Participation in Patient Care: Yes (Comment) Care giver support system in place?: Yes (comment)   Criminal Activity/Legal Involvement Pertinent to Current Situation/Hospitalization: No - Comment as needed  Activities of Daily Living      Permission Sought/Granted Permission sought to share information with : Facility Sport and exercise psychologist, Family Supports    Share Information with NAME: Lee [name]  Permission granted to share info w AGENCY: SNF  Permission granted to share info w Relationship: Spouse  Permission granted to share info w Contact Information: [phone]  Emotional Assessment   Attitude/Demeanor/Rapport: Unable to  Assess Affect (typically observed): Unable to Assess Orientation: : Oriented to Self Alcohol / Substance Use: Not Applicable Psych Involvement: No (comment)  Admission diagnosis:  PNA (pneumonia) [J18.9] Altered mental status, unspecified altered mental status type [R41.82] Multifocal pneumonia [J18.9] Aspiration pneumonia, unspecified aspiration pneumonia type, unspecified laterality, unspecified part of lung ([place]) [J69.0] Patient Active Problem List   Diagnosis Date Noted  . Palliative care by specialist   . Goals of care, counseling/discussion   . DNR (do not resuscitate)   . Multifocal pneumonia 05/14/2019  . Sepsis ([place]) 03/29/2019  . Macrocytosis   . Protein-calorie malnutrition, severe 03/04/2019  . Acute respiratory failure with hypoxemia ([place]) 02/23/2019  . Pressure injury of skin 06/30/2018  . Acute respiratory failure with hypoxia ([place])   . Seizure disorder ([place]) 06/25/2018  . Seizure ([place]) 08/29/2016  . Leukocytosis 08/29/2016  . History of CVA (cerebrovascular accident) 08/29/2016  . Somnolence 08/29/2016  . Post-ictal state ([place]) 08/29/2016  . Lip laceration   . Pain   . Leg weakness, bilateral   . Leg pain, bilateral 08/17/2016  . UTI (urinary tract infection) 08/15/2016  . Aggression 07/26/2016  . Dementia ([place]) 07/26/2016  . Elevated troponin 12/30/2015  . Chest pain 12/30/2015  . Aspiration pneumonia ([place]) 11/15/2013  . Status epilepticus ([place]) 11/14/2013  . Essential hypertension 11/14/2013  . Cerebral thrombosis with cerebral infarction ([place]) 11/07/2013  . Seizures ([place]) 11/06/2013  . Chronic airway obstruction, not elsewhere classified 09/22/2013  . Stroke ([place]) 09/22/2013  .  Hyperlipidemia 09/18/2013  . Encephalopathy acute 09/11/2013  . Altered mental status 09/11/2013  . CVA (cerebral infarction) 09/11/2013  . Hyponatremia 09/11/2013  . Acute encephalopathy 09/11/2013  . Aftercare following surgery of the circulatory system, NEC 08/25/2012  .  Foot swelling 02/04/2012  . Drainage from wound 01/04/2012  . Peripheral vascular disease (HCC) 11/05/2011  . Pain in limb 11/05/2011  . 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 PAD (peripheral artery disease) (HCC) 10/28/2011  . TOBACCO ABUSE 02/06/2007  . SYMPTOM, EDEMA 06/13/2006  . ERECTILE DYSFUNCTION 01/25/2006   PCP:  Mirna Mires, MD Pharmacy:   [facility] [place], [place] - [address] [place] [address] Phone: [phone] Fax: [phone]     Social Determinants of Health (SDOH) Interventions    Readmission Risk Interventions No flowsheet data found.

## 2019-05-17 NOTE — Discharge Summary (Addendum)
Physician Discharge Summary  Lee [name] DGU:[id number] DOB: 1950-11-15 DOA: 05/14/2019  PCP: Mirna Mires, MD  Admit date: 05/14/2019 Discharge date: 05/17/2019  Admitted From: SNF Disposition: SNF  Recommendations for Outpatient Follow-up:  1. Follow up with PCP in 1-2 weeks 2. Please obtain BMP/CBC in one week 3. Please have Palliative care follow on patient.  4. Patient will benefit of having family visiting.    Discharge Condition: Stable CODE STATUS: DNR Diet recommendation: Dysphagia 3  Brief/Interim Summary: 69 year old with past medical history significant for seizure disorder, CVA, dysphagia on pured diet, COPD, advanced dementia, history of alcohol use, hypertension brought to the ED due to shortness of breath and altered mental status.  Patient was found to have pulse oximeter 90%, patient became lethargic and sleepy, which is not patient baseline. Evaluation in the ED CT chest showed bilateral lower field infiltrate.  CT head negative for acute findings.  COVID-19 negative.  White blood cell 12.5.   1-Multifocal pneumonia: Concern with aspiration pneumonia: Treated  with Unasyn 3 days. Discharge on Augmentin for 4 days. [name] Speech therapy consulted. Continue NPO status WBC normalized.  Palliative care to follow at facility.  On Dysphagia diet 3.   2-Acute metabolic encephalopathy Patient presented with altered mental status, worsening lethargic. EEG negative for seizure, consistent with metabolic encephalopathy/ . Suspect related to infectious process.  He is alert, feeding himself, alert to person.   3-Hypertension: BP increasing, resume Norvasc, Hydralazine , imdur.   4-Seizure disorder: Continue with  Keppra, Vimpat and Depakote/. Tegretol Resume schedule klonopin, to avoid withdrawal.  EEG negative for seizure. No evidence of seizure.   Advanced dementia: Palliative care consulted.  Appreciate palliative care team assistance with the care provided  to [name]. No Peg tube, DNR/DNI,  I agree with those intervention.   Hypoglycemia; change fluids to D 5 . cbg increase to 85. now eating better.  Hypokalemia replete orally.        Pressure Injury 05/15/19 Buttocks Right Stage 3 -  Full thickness tissue loss. Subcutaneous fat may be visible but bone, tendon or muscle are NOT exposed. (Active)  05/15/19 0100  Location: Buttocks  Location Orientation: Right  Staging: Stage 3 -  Full thickness tissue loss. Subcutaneous fat may be visible but bone, tendon or muscle are NOT exposed.  Wound Description (Comments):   Present on Admission: Yes     Pressure Injury 05/15/19 Heel Right;Left Stage 1 -  Intact skin with non-blanchable redness of a localized area usually over a bony prominence. (Active)  05/15/19 0100  Location: Heel  Location Orientation: Right;Left  Staging: Stage 1 -  Intact skin with non-blanchable redness of a localized area usually over a bony prominence.  Wound Description (Comments):   Present on Admission: Yes            Estimated body mass index is 19.42 kg/m as calculated from the following:   Height as of 03/29/19: 5\' 10"  (1.778 m).   Weight as of 04/03/19: 61.4 kg.    Discharge Diagnoses:  Active Problems:   Multifocal pneumonia   Palliative care by specialist   Goals of care, counseling/discussion   DNR (do not resuscitate)    Discharge Instructions  Discharge Instructions    Diet - low sodium heart healthy   Complete by: As directed    Increase activity slowly   Complete by: As directed      Allergies as of 05/17/2019      Reactions   Erskine Emery Kingsley Plan Erskine Emery  Oil] Diarrhea   Vicodin [hydrocodone-acetaminophen] Other (See Comments)   "triggered seizure both here and at home when he tried to take it" (01/22/2012)   Penicillins Nausea And Vomiting   Tolerates Zosyn and Unasyn. Has patient had a PCN reaction causing immediate rash, facial/tongue/throat swelling, SOB or  lightheadedness with hypotension: no Has patient had a PCN reaction causing severe rash involving mucus membranes or skin necrosis: no Has patient had a PCN reaction that required hospitalization: no Has patient had a PCN reaction occurring within the last 10 years: no If all of the above answers are "NO", then may proceed with Cephalosporin u   Oxycodone    Causes Seizures      Medication List    STOP taking these medications   Baclofen 5 MG Tabs   diphenhydrAMINE 25 mg capsule Commonly known as: BENADRYL   feeding supplement (ENSURE ENLIVE) Liqd   folic acid 1 MG tablet Commonly known as: FOLVITE   pantoprazole 40 MG tablet Commonly known as: PROTONIX   thiamine 100 MG tablet     TAKE these medications   acetaminophen 325 MG tablet Commonly known as: TYLENOL Take 650 mg by mouth every 12 (twelve) hours.   albuterol 108 (90 Base) MCG/ACT inhaler Commonly known as: VENTOLIN HFA Inhale 2 puffs into the lungs every 6 (six) hours as needed for wheezing or shortness of breath.   amLODipine 10 MG tablet Commonly known as: NORVASC Take 1 tablet (10 mg total) by mouth daily.   amoxicillin-clavulanate 875-125 MG tablet Commonly known as: Augmentin Take 1 tablet by mouth 2 (two) times daily for 3 days.   ARTIFICIAL TEARS OP Place 2 drops into the left eye 2 (two) times daily.   aspirin 81 MG chewable tablet Chew 81 mg by mouth daily.   B-complex with vitamin C tablet Take 1 tablet by mouth daily.   carBAMazepine 100 MG/5ML suspension Commonly known as: TEGRETOL Take 15 mLs (300 mg total) by mouth 2 (two) times daily.   cholecalciferol 1000 units tablet Commonly known as: VITAMIN D Take 1,000 Units by mouth once a week. On Mondays.   clonazePAM 0.5 MG tablet Commonly known as: KLONOPIN Take 0.5 tablets (0.25 mg total) by mouth 2 (two) times daily. 2 pm and 7 pm What changed: Another medication with the same name was removed. Continue taking this medication, and  follow the directions you see here.   hydrALAZINE 25 MG tablet Commonly known as: APRESOLINE Take 25 mg by mouth daily as needed (SBP > 195).   hydrALAZINE 50 MG tablet Commonly known as: APRESOLINE Take 50 mg by mouth 3 (three) times daily.   hydroxypropyl methylcellulose / hypromellose 2.5 % ophthalmic solution Commonly known as: ISOPTO TEARS / GONIOVISC Place 2 drops into the left eye in the morning and at bedtime.   isosorbide mononitrate 30 MG 24 hr tablet Commonly known as: IMDUR Take 30 mg by mouth at bedtime.   levETIRAcetam 100 MG/ML solution Commonly known as: KEPPRA Take 15 mLs (1,500 mg total) by mouth 2 (two) times daily.   magnesium oxide 400 MG tablet Commonly known as: MAG-OX Take 400 mg by mouth daily.   multivitamin with minerals Tabs tablet Take 1 tablet by mouth daily.   NON FORMULARY Apply 1 mL topically in the morning, at noon, and at bedtime. ABH gel (Ativan 1mg Benito Mccreedy 12.5mg /Haldol 1mg ). Apply 1mL topically to forearm three times daily for mood disorder.   omeprazole 20 MG tablet Commonly known as: PRILOSEC OTC Take 20  mg by mouth daily.   polyethylene glycol 17 g packet Commonly known as: MIRALAX / GLYCOLAX Take 17 g by mouth at bedtime. Take 17 grams mix with 8OZ of apple juice for constipation.   saccharomyces boulardii 250 MG capsule Commonly known as: FLORASTOR Take 250 mg by mouth daily.   senna 8.6 MG Tabs tablet Commonly known as: SENOKOT Take 2 tablets by mouth at bedtime.   simvastatin 10 MG tablet Commonly known as: ZOCOR Take 10 mg by mouth at bedtime.   valproic acid 250 MG/5ML solution Commonly known as: DEPAKENE Take 15 mLs (750 mg total) by mouth every 8 (eight) hours. What changed: when to take this   Vimpat 150 MG Tabs Generic drug: Lacosamide Take 300 mg by mouth in the morning and at bedtime.      Contact information for after-discharge care    Destination    [facility]erred [facility] .   Service:  Skilled Nursing Contact information: [address] [place] [address] [phone]             Allergies  Allergen Reactions  . Orange Juice [Orange Oil] Diarrhea  . Vicodin [Hydrocodone-Acetaminophen] Other (See Comments)    "triggered seizure both here and at home when he tried to take it" (01/22/2012)  . Penicillins Nausea And Vomiting    Tolerates Zosyn and Unasyn. Has patient had a PCN reaction causing immediate rash, facial/tongue/throat swelling, SOB or lightheadedness with hypotension: no Has patient had a PCN reaction causing severe rash involving mucus membranes or skin necrosis: no Has patient had a PCN reaction that required hospitalization: no Has patient had a PCN reaction occurring within the last 10 years: no If all of the above answers are "NO", then may proceed with Cephalosporin u  . Oxycodone     Causes Seizures    Consultations:  Palliative   Procedures/Studies: CT Head Wo Contrast  Result Date: 05/14/2019 CLINICAL DATA:  Altered mental status (AMS), unclear cause. EXAM: CT HEAD WITHOUT CONTRAST TECHNIQUE: Contiguous axial images were obtained from the base of the skull through the vertex without intravenous contrast. COMPARISON:  Head CT 08/07/2020 FINDINGS: Brain: Redemonstrated small chronic lacunar infarcts within the right thalamus and left cerebellar hemisphere. Stable ill-defined hypoattenuation within the cerebral white matter which is nonspecific, but consistent with chronic small vessel ischemic disease. Unchanged moderate generalized parenchymal atrophy. There is no acute intracranial hemorrhage. No demarcated cortical infarct. No extra-axial fluid collection. No evidence of intracranial mass. No midline shift. Vascular: No hyperdense vessel.  Atherosclerotic calcifications. Skull: Normal. Negative for fracture or focal lesion. Sinuses/Orbits: Visualized orbits show no acute finding. No significant paranasal sinus disease at the imaged  levels. Left mastoid effusion. IMPRESSION: 1. No evidence of acute intracranial abnormality. 2. Stable chronic small vessel ischemic disease with redemonstrated chronic right thalamic and left cerebellar lacunar infarcts. 3. Stable moderate generalized parenchymal atrophy. 4. Left mastoid effusion. Electronically Signed   By: Jackey Loge DO   On: 05/14/2019 14:13   CT Chest Wo Contrast  Result Date: 05/14/2019 CLINICAL DATA:  Hypoxia EXAM: CT CHEST WITHOUT CONTRAST TECHNIQUE: Multidetector CT imaging of the chest was performed following the standard protocol without IV contrast. COMPARISON:  Chest radiograph March 29, 2019 FINDINGS: Cardiovascular: There is no thoracic aortic aneurysm. There are scattered foci of calcification in visualized great vessels. There are foci of aortic atherosclerosis. Multiple foci of coronary artery calcification noted. There is no pericardial effusion or pericardial thickening. Mediastinum/Nodes: Thyroid appears normal. There are subcentimeter  mediastinal lymph nodes. There is no adenopathy in the thoracic region by size criteria. No esophageal lesions are evident. Lungs/Pleura: There is underlying centrilobular emphysematous change. There are multiple areas of patchy airspace opacity consistent with multifocal pneumonia throughout both lower lobes with multiple tree on bud type opacities, particularly on the right, again consistent with pneumonia. Subtle areas of patchy opacity noted in the right upper lobe, likely due to small foci of peripheral pneumonia in the right upper lobe. There is mild consolidation in the posterior lung bases bilaterally. No associated pleural effusions. There is a degree of lower lobe bronchiectatic change. There is mild lobular septal thickening in the lung bases as well. Upper Abdomen: In the visualized upper abdomen, there is aortic and proximal major mesenteric arterial vascular calcification at several sites. There are probable vascular  calcifications in the periphery of the left kidney. There is an apparent cyst in the upper pole left kidney measuring 1.1 x 1.0 cm. Visualized upper abdominal structures otherwise appear unremarkable. Musculoskeletal: There is mild anterior wedging of the T9 vertebral body. No blastic or lytic bone lesions. No evident chest wall lesions. IMPRESSION: 1. Bilateral lower extremity pneumonia with multiple foci of airspace opacity, somewhat more on the right than on the left. Consolidation in each posterior lung base. Subtle patchy infiltrate noted in the periphery of the right upper lobe. 2. Underlying centrilobular emphysematous change. There is lower lobe bronchiectatic change as well as mild basilar lobular thickening on each side. There may be a degree of usual interstitial pneumonitis developing in the lung bases superimposed on the emphysema. 3.  No evident adenopathy. 4. Aortic atherosclerosis as well as foci of great vessel and major mesenteric arterial vascular calcification. Multiple foci of coronary artery calcification noted. Aortic Atherosclerosis (ICD10-I70.0) and Emphysema (ICD10-J43.9). Electronically Signed   By: Bretta Bang III M.D.   On: 05/14/2019 14:12   EEG adult  Result Date: 05/15/2019 Charlsie Quest, MD     05/15/2019  1:10 PM Patient Name: RIKY DEGRAY MRN: [id number] Epilepsy Attending: Charlsie Quest Referring Physician/Provider: Dr. Hartley Barefoot Date: 05/15/2019 Duration: 24.07 mins Patient history: 69 year old male with history of seizure who presented with altered mental status, worsening lethargy.  EEG to evaluate for seizures. Level of alertness: Awake, asleep AEDs during EEG study: Lacosamide, Depakote Technical aspects: This EEG study was done with scalp electrodes positioned according to the 10-20 International system of electrode placement. Electrical activity was acquired at a sampling rate of 500Hz  and reviewed with a high frequency filter of 70Hz  and a low  frequency filter of 1Hz . EEG data were recorded continuously and digitally stored. Description: During awake state, no clear posterior dominant was seen.  Sleep was characterized by vertex waves, maximal frontocentral region.  EEG showed continuous generalized 5 to 6 Hz theta slowing. Hyperventilation and photic stimulation were not performed. Abnormality -Continuous slow, generalized IMPRESSION: This study is suggestive of moderate diffuse encephalopathy, nonspecific to etiology. No seizures or epileptiform discharges were seen throughout the recording. Priyanka Annabelle Harman     Subjective: Alert, feeding himself. Denies pain   Discharge Exam: Vitals:   05/16/19 2224 05/17/19 0800  BP: (!) 113/52 (!) 172/83  Pulse: 60 (!) 55  Resp: 20 18  Temp: 99.3 F (37.4 C) 98.2 F (36.8 C)  SpO2: 97% 95%     General: Pt is alert, awake, not in acute distress, chronic ill appearing Cardiovascular: RRR, S1/S2 +, no rubs, no gallops Respiratory: CTA bilaterally, no wheezing, no rhonchi Abdominal:  Soft, NT, ND, bowel sounds + Extremities: no edema, no cyanosis    The results of significant diagnostics from this hospitalization (including imaging, microbiology, ancillary and laboratory) are listed below for reference.     Microbiology: Recent Results (from the past 240 hour(s))  Respiratory Panel by RT PCR (Flu A&B, Covid) - Nasopharyngeal Swab     Status: None   Collection Time: 05/14/19 11:47 AM   Specimen: Nasopharyngeal Swab  Result Value Ref Range Status   SARS Coronavirus 2 by RT PCR NEGATIVE NEGATIVE Final    Comment: (NOTE) SARS-CoV-2 target nucleic acids are NOT DETECTED. The SARS-CoV-2 RNA is generally detectable in upper respiratoy specimens during the acute phase of infection. The lowest concentration of SARS-CoV-2 viral copies this assay can detect is 131 copies/mL. A negative result does not preclude SARS-Cov-2 infection and should not be used as the sole basis for treatment  or other patient management decisions. A negative result may occur with  improper specimen collection/handling, submission of specimen other than nasopharyngeal swab, presence of viral mutation(s) within the areas targeted by this assay, and inadequate number of viral copies (<131 copies/mL). A negative result must be combined with clinical observations, patient history, and epidemiological information. The expected result is Negative. Fact Sheet for Patients:  https://www.moore.com/ Fact Sheet for Healthcare Providers:  https://www.young.biz/ This test is not yet ap proved or cleared by the [place] FDA and  has been authorized for detection and/or diagnosis of SARS-CoV-2 by FDA under an Emergency Use Authorization (EUA). This EUA will remain  in effect (meaning this test can be used) for the duration of the COVID-19 declaration under Section 564(b)(1) of the Act, 21 U.S.C. section 360bbb-3(b)(1), unless the authorization is terminated or revoked sooner.    Influenza A by PCR NEGATIVE NEGATIVE Final   Influenza B by PCR NEGATIVE NEGATIVE Final    Comment: (NOTE) The Xpert Xpress SARS-CoV-2/FLU/RSV assay is intended as an aid in  the diagnosis of influenza from Nasopharyngeal swab specimens and  should not be used as a sole basis for treatment. Nasal washings and  aspirates are unacceptable for Xpert Xpress SARS-CoV-2/FLU/RSV  testing. Fact Sheet for Patients: https://www.moore.com/ Fact Sheet for Healthcare Providers: https://www.young.biz/ This test is not yet approved or cleared by the [place] FDA and  has been authorized for detection and/or diagnosis of SARS-CoV-2 by  FDA under an Emergency Use Authorization (EUA). This EUA will remain  in effect (meaning this test can be used) for the duration of the  Covid-19 declaration under Section 564(b)(1) of the Act, 21  U.S.C. section  360bbb-3(b)(1), unless the authorization is  terminated or revoked. Performed at [facility], [address] [place], [place] [address]   MRSA PCR Screening     Status: None   Collection Time: 05/15/19  1:40 AM   Specimen: Nasopharyngeal  Result Value Ref Range Status   MRSA by PCR NEGATIVE NEGATIVE Final    Comment:        The GeneXpert MRSA Assay (FDA approved for NASAL specimens only), is one component of a comprehensive MRSA colonization surveillance program. It is not intended to diagnose MRSA infection nor to guide or monitor treatment for MRSA infections. Performed at [facility], [address], [place], [place] [address]      Labs: BNP (last 3 results) Recent Labs    02/23/19 1626 03/29/19 1318  BNP 76.6 62.9   Basic Metabolic Panel: Recent Labs  Lab 05/14/19 1316 05/15/19 0249 05/15/19  0740 05/16/19 0233 05/17/19 0802  NA 139 139  --  141 142  K 4.6 4.6  --  3.7 3.3*  CL 101 104  --  106 109  CO2 28 21*  --  22 26  GLUCOSE 102* 76  --  62* 89  BUN 10 12  --  12 5*  CREATININE 0.76 0.67  --  0.65 0.64  CALCIUM 9.0 8.8*  --  8.5* 8.5*  MG  --   --  1.8  --   --    Liver Function Tests: No results for input(s): AST, ALT, ALKPHOS, BILITOT, PROT, ALBUMIN in the last 168 hours. No results for input(s): LIPASE, AMYLASE in the last 168 hours. Recent Labs  Lab 05/15/19 0844  AMMONIA 22   CBC: Recent Labs  Lab 05/14/19 1138 05/15/19 0740 05/16/19 0233 05/17/19 0802  WBC 12.5* 9.4 7.2 6.9  NEUTROABS 10.2*  --   --   --   HGB 14.1 13.8 12.5* 11.9*  HCT 43.4 41.8 37.9* 36.2*  MCV 101.4* 99.8 99.0 96.8  PLT 408* 367 331 295   Cardiac Enzymes: No results for input(s): CKTOTAL, CKMB, CKMBINDEX, TROPONINI in the last 168 hours. BNP: Invalid input(s): POCBNP CBG: Recent Labs  Lab 05/14/19 1050 05/16/19 0743 05/16/19 0809 05/17/19 0807  GLUCAP 102* 66* 82 88   D-Dimer No results for input(s): DDIMER in the last 72 hours. Hgb  A1c No results for input(s): HGBA1C in the last 72 hours. Lipid Profile No results for input(s): CHOL, HDL, LDLCALC, TRIG, CHOLHDL, LDLDIRECT in the last 72 hours. Thyroid function studies Recent Labs    05/15/19 0844  TSH 0.765   Anemia work up No results for input(s): VITAMINB12, FOLATE, FERRITIN, TIBC, IRON, RETICCTPCT in the last 72 hours. Urinalysis    Component Value Date/Time   COLORURINE YELLOW 02/23/2019 1720   APPEARANCEUR HAZY (A) 02/23/2019 1720   LABSPEC 1.016 02/23/2019 1720   PHURINE 6.0 02/23/2019 1720   GLUCOSEU NEGATIVE 02/23/2019 1720   HGBUR NEGATIVE 02/23/2019 1720   BILIRUBINUR NEGATIVE 02/23/2019 1720   KETONESUR NEGATIVE 02/23/2019 1720   PROTEINUR 30 (A) 02/23/2019 1720   UROBILINOGEN 0.2 03/22/2014 1206   NITRITE NEGATIVE 02/23/2019 1720   LEUKOCYTESUR NEGATIVE 02/23/2019 1720   Sepsis Labs Invalid input(s): PROCALCITONIN,  WBC,  LACTICIDVEN Microbiology Recent Results (from the past 240 hour(s))  Respiratory Panel by RT PCR (Flu A&B, Covid) - Nasopharyngeal Swab     Status: None   Collection Time: 05/14/19 11:47 AM   Specimen: Nasopharyngeal Swab  Result Value Ref Range Status   SARS Coronavirus 2 by RT PCR NEGATIVE NEGATIVE Final    Comment: (NOTE) SARS-CoV-2 target nucleic acids are NOT DETECTED. The SARS-CoV-2 RNA is generally detectable in upper respiratoy specimens during the acute phase of infection. The lowest concentration of SARS-CoV-2 viral copies this assay can detect is 131 copies/mL. A negative result does not preclude SARS-Cov-2 infection and should not be used as the sole basis for treatment or other patient management decisions. A negative result may occur with  improper specimen collection/handling, submission of specimen other than nasopharyngeal swab, presence of viral mutation(s) within the areas targeted by this assay, and inadequate number of viral copies (<131 copies/mL). A negative result must be combined with  clinical observations, patient history, and epidemiological information. The expected result is Negative. Fact Sheet for Patients:  https://www.moore.com/ Fact Sheet for Healthcare Providers:  https://www.young.biz/ This test is not yet ap proved or cleared by the [place]  FDA and  has been authorized for detection and/or diagnosis of SARS-CoV-2 by FDA under an Emergency Use Authorization (EUA). This EUA will remain  in effect (meaning this test can be used) for the duration of the COVID-19 declaration under Section 564(b)(1) of the Act, 21 U.S.C. section 360bbb-3(b)(1), unless the authorization is terminated or revoked sooner.    Influenza A by PCR NEGATIVE NEGATIVE Final   Influenza B by PCR NEGATIVE NEGATIVE Final    Comment: (NOTE) The Xpert Xpress SARS-CoV-2/FLU/RSV assay is intended as an aid in  the diagnosis of influenza from Nasopharyngeal swab specimens and  should not be used as a sole basis for treatment. Nasal washings and  aspirates are unacceptable for Xpert Xpress SARS-CoV-2/FLU/RSV  testing. Fact Sheet for Patients: https://www.moore.com/ Fact Sheet for Healthcare Providers: https://www.young.biz/ This test is not yet approved or cleared by the [place] FDA and  has been authorized for detection and/or diagnosis of SARS-CoV-2 by  FDA under an Emergency Use Authorization (EUA). This EUA will remain  in effect (meaning this test can be used) for the duration of the  Covid-19 declaration under Section 564(b)(1) of the Act, 21  U.S.C. section 360bbb-3(b)(1), unless the authorization is  terminated or revoked. Performed at [facility], [address], [place], [place] [address]   MRSA PCR Screening     Status: None   Collection Time: 05/15/19  1:40 AM   Specimen: Nasopharyngeal  Result Value Ref Range Status   MRSA by PCR NEGATIVE NEGATIVE Final    Comment:        The  GeneXpert MRSA Assay (FDA approved for NASAL specimens only), is one component of a comprehensive MRSA colonization surveillance program. It is not intended to diagnose MRSA infection nor to guide or monitor treatment for MRSA infections. Performed at [facility], [address], [place], [place] [address]      Time coordinating discharge: 40 minutes  SIGNED:   Alba Cory, MD  [facility]

## 2019-05-17 NOTE — NC FL2 (Signed)
[place] LEVEL OF CARE SCREENING TOOL     IDENTIFICATION  Patient Name: Lee [name] Birthdate: 1950/05/26 Sex: male Admission Date (Current Location): 05/14/2019  [facility] [place] Number:  [name] and Address:  [facility] [place], [place] [address], [place], [place] [address]      Provider Number: [id number]  Attending Physician Name and Address:  Elmarie Shiley, MD  Relative Name and Phone Number:  Karol Liendo    Current Level of Care: Hospital Recommended Level of Care: [place] Prior Approval Number:    Date Approved/Denied:   PASRR Number: [id number]  Discharge Plan: SNF    Current Diagnoses: Patient Active Problem List   Diagnosis Date Noted  . Palliative care by specialist   . Goals of care, counseling/discussion   . DNR (do not resuscitate)   . Multifocal pneumonia 05/14/2019  . Sepsis ([place]) 03/29/2019  . Macrocytosis   . Protein-calorie malnutrition, severe 03/04/2019  . Acute respiratory failure with hypoxemia ([place]) 02/23/2019  . Pressure injury of skin 06/30/2018  . Acute respiratory failure with hypoxia ([place])   . Seizure disorder ([place]) 06/25/2018  . Seizure ([place]) 08/29/2016  . Leukocytosis 08/29/2016  . History of CVA (cerebrovascular accident) 08/29/2016  . Somnolence 08/29/2016  . Post-ictal state ([place]) 08/29/2016  . Lip laceration   . Pain   . Leg weakness, bilateral   . Leg pain, bilateral 08/17/2016  . UTI (urinary tract infection) 08/15/2016  . Aggression 07/26/2016  . Dementia ([place]) 07/26/2016  . Elevated troponin 12/30/2015  . Chest pain 12/30/2015  . Aspiration pneumonia ([place]) 11/15/2013  . Status epilepticus ([place]) 11/14/2013  . Essential hypertension 11/14/2013  . Cerebral thrombosis with cerebral infarction ([place]) 11/07/2013  . Seizures ([place]) 11/06/2013  . Chronic airway obstruction, not elsewhere classified 09/22/2013  . Stroke ([place]) 09/22/2013  .  Hyperlipidemia 09/18/2013  . Encephalopathy acute 09/11/2013  . Altered mental status 09/11/2013  . CVA (cerebral infarction) 09/11/2013  . Hyponatremia 09/11/2013  . Acute encephalopathy 09/11/2013  . Aftercare following surgery of the circulatory system, [place] 08/25/2012  . Foot swelling 02/04/2012  . Drainage from wound 01/04/2012  . Peripheral vascular disease ([place]) 11/05/2011  . Pain in limb 11/05/2011  . Chronic total occlusion of artery of the extremities ([place]) 11/05/2011  . PAD (peripheral artery disease) ([place]) 10/28/2011  . TOBACCO ABUSE 02/06/2007  . SYMPTOM, EDEMA 06/13/2006  . ERECTILE DYSFUNCTION 01/25/2006    Orientation RESPIRATION BLADDER Height & Weight     Self  Normal External catheter, Incontinent Weight:   Height:     BEHAVIORAL SYMPTOMS/MOOD NEUROLOGICAL BOWEL NUTRITION STATUS      Continent Diet(See discharge summary)  AMBULATORY STATUS COMMUNICATION OF NEEDS Skin   Total Care   Other (Comment)(Pressure injury- buttocks)                       Personal Care Assistance Level of Assistance  Total care       Total Care Assistance: Maximum assistance   Functional Limitations Info  Sight, Hearing, Speech Sight Info: Adequate Hearing Info: Adequate Speech Info: Adequate    SPECIAL CARE FACTORS FREQUENCY                       Contractures Contractures Info: Not present    Additional Factors Info  Code Status, Allergies Code Status Info: DNR Allergies Info: Orange Juice (Orange Oil), Vicodin (Hydrocodone-acetaminophen), Penicillins, Oxycodone  Current Medications (05/17/2019):  This is the current hospital active medication list Current Facility-Administered Medications  Medication Dose Route Frequency Provider Last Rate Last Admin  . acetaminophen (TYLENOL) tablet 650 mg  650 mg Oral Q6H PRN Regalado, Belkys A, MD   650 mg at 05/15/19 1634  . albuterol (PROVENTIL) (2.5 MG/3ML) 0.083% nebulizer solution 2.5 mg  2.5 mg  Inhalation Q6H PRN Mikey College T, MD      . Ampicillin-Sulbactam (UNASYN) 3 g in sodium chloride 0.9 % 100 mL IVPB  3 g Intravenous Q6H Emeline General, MD   Stopped at 05/17/19 [phone]  . aspirin chewable tablet 81 mg  81 mg Oral Daily Regalado, Belkys A, MD      . Chlorhexidine Gluconate Cloth 2 % PADS 6 each  6 each Topical Q0600 Emeline General, MD   6 each at 05/17/19 [phone]  . clonazePAM (KLONOPIN) disintegrating tablet 0.25 mg  0.25 mg Oral BID Regalado, Belkys A, MD   0.25 mg at 05/17/19 0740  . dextrose 5 % in lactated ringers infusion   Intravenous Continuous Regalado, Belkys A, MD 75 mL/hr at 05/17/19 0700 Rate Verify at 05/17/19 0700  . docusate sodium (ENEMEEZ) enema 283 mg  1 enema Rectal Daily PRN Mikey College T, MD      . enoxaparin (LOVENOX) injection 40 mg  40 mg Subcutaneous Q24H Mikey College T, MD   40 mg at 05/17/19 7209  . hydroxypropyl methylcellulose / hypromellose (ISOPTO TEARS / GONIOVISC) 2.5 % ophthalmic solution 2 drop  2 drop Left Eye TID PRN Mikey College T, MD   2 drop at 05/16/19 2137  . labetalol (NORMODYNE) injection 10 mg  10 mg Intravenous Q2H PRN Mikey College T, MD      . lacosamide (VIMPAT) 300 mg in sodium chloride 0.9 % 25 mL IVPB  300 mg Intravenous Q12H Opyd, Lavone Neri, MD   Stopped at 05/16/19 2241  . levETIRAcetam (KEPPRA) IVPB 1500 mg/ 100 mL premix  1,500 mg Intravenous Q12H Mikey College T, MD 400 mL/hr at 05/17/19 0735 1,500 mg at 05/17/19 0735  . pantoprazole (PROTONIX) injection 40 mg  40 mg Intravenous Q24H Mikey College T, MD   40 mg at 05/16/19 1723  . polyvinyl alcohol (LIQUIFILM TEARS) 1.4 % ophthalmic solution 1 drop  1 drop Left Eye BID Mikey College T, MD   1 drop at 05/17/19 0740  . valproate (DEPACON) 750 mg in dextrose 5 % 50 mL IVPB  750 mg Intravenous Q8H Opyd, Lavone Neri, MD   Stopped at 05/17/19 4709     Discharge Medications: Please see discharge summary for a list of discharge medications.  Relevant Imaging Results:  Relevant Lab  Results:   Additional Information SSN [id number]  Dannette Barbara Sheldon, [place]

## 2019-05-17 NOTE — Progress Notes (Addendum)
OT Cancellation Note  Patient Details Name: Lee [name] MRN: [id number] DOB: 1950-01-12   Cancelled Treatment:    Reason Eval/Treat Not Completed: OT screened, no needs identified, will sign off(Pt at functional baseline for ADL and mobility. OT sign off.)   Pt total care for ADL and mobility.  [name], OTR/L Acute Rehabilitation Services Pager: [phone] Office: [phone]   [name] 05/17/2019, 11:48 AM

## 2019-05-17 NOTE — TOC Transition Note (Addendum)
Transition of Care [facility]) - CM/SW Discharge Note   Patient Details  Name: Lee [name] MRN: [id number] Date of Birth: 1950/08/26  Transition of Care [facility]) CM/SW Contact:  Luiz Blare Phone Number: 05/17/2019, 11:11 AM   Clinical Narrative:    Patient will DC to: [facility] Anticipated DC date: 05/17/19 Family notified: [name], spouse Transport by: Sharin Mons   Per MD patient ready for DC to [facility]. RN, patient, patient's family, and facility notified of DC. Discharge Summary and FL2 sent to facility. RN to call report prior to discharge [phone] Room 202[facility]). DC packet on chart. Ambulance transport requested for patient.   CSW will sign off for now as social work intervention is no longer needed. Please consult [place] again if new needs arise.    Final next level of care: Skilled Nursing Facility Barriers to Discharge: No Barriers Identified   Patient Goals and CMS Choice   CMS Medicare.gov Compare Post Acute Care list provided to:: Patient Represenative (must comment)([name] Mayford Knife) Choice offered to / list presented to : Spouse  Discharge Placement PASRR number recieved: 05/17/19            Patient [facility] bed at: [facility] Patient to be transferred to facility by: [facility] Name of family member notified: Margrett Rud Patient and family notified of of transfer: 05/17/19  Discharge Plan and Services                                     Social Determinants of Health (SDOH) Interventions     Readmission Risk Interventions No flowsheet data found.

## 2019-05-18 DIAGNOSIS — M2450 Contracture, unspecified joint: Secondary | ICD-10-CM | POA: Diagnosis not present

## 2019-05-18 DIAGNOSIS — M24561 Contracture, right knee: Secondary | ICD-10-CM | POA: Diagnosis not present

## 2019-05-18 DIAGNOSIS — I10 Essential (primary) hypertension: Secondary | ICD-10-CM | POA: Diagnosis not present

## 2019-05-18 DIAGNOSIS — R2681 Unsteadiness on feet: Secondary | ICD-10-CM | POA: Diagnosis not present

## 2019-05-18 DIAGNOSIS — R1319 Other dysphagia: Secondary | ICD-10-CM | POA: Diagnosis not present

## 2019-05-18 DIAGNOSIS — Z9181 History of falling: Secondary | ICD-10-CM | POA: Diagnosis not present

## 2019-05-18 DIAGNOSIS — R278 Other lack of coordination: Secondary | ICD-10-CM | POA: Diagnosis not present

## 2019-05-18 DIAGNOSIS — R1312 Dysphagia, oropharyngeal phase: Secondary | ICD-10-CM | POA: Diagnosis not present

## 2019-05-18 DIAGNOSIS — J690 Pneumonitis due to inhalation of food and vomit: Secondary | ICD-10-CM | POA: Diagnosis not present

## 2019-05-18 DIAGNOSIS — R293 Abnormal posture: Secondary | ICD-10-CM | POA: Diagnosis not present

## 2019-05-18 DIAGNOSIS — M24571 Contracture, right ankle: Secondary | ICD-10-CM | POA: Diagnosis not present

## 2019-05-18 DIAGNOSIS — J168 Pneumonia due to other specified infectious organisms: Secondary | ICD-10-CM | POA: Diagnosis not present

## 2019-05-18 DIAGNOSIS — J9601 Acute respiratory failure with hypoxia: Secondary | ICD-10-CM | POA: Diagnosis not present

## 2019-05-18 DIAGNOSIS — Z7409 Other reduced mobility: Secondary | ICD-10-CM | POA: Diagnosis not present

## 2019-05-18 DIAGNOSIS — J984 Other disorders of lung: Secondary | ICD-10-CM | POA: Diagnosis not present

## 2019-05-18 DIAGNOSIS — R2689 Other abnormalities of gait and mobility: Secondary | ICD-10-CM | POA: Diagnosis not present

## 2019-05-18 DIAGNOSIS — J9690 Respiratory failure, unspecified, unspecified whether with hypoxia or hypercapnia: Secondary | ICD-10-CM | POA: Diagnosis not present

## 2019-05-18 DIAGNOSIS — M79662 Pain in left lower leg: Secondary | ICD-10-CM | POA: Diagnosis not present

## 2019-05-18 DIAGNOSIS — M79661 Pain in right lower leg: Secondary | ICD-10-CM | POA: Diagnosis not present

## 2019-05-18 DIAGNOSIS — M6281 Muscle weakness (generalized): Secondary | ICD-10-CM | POA: Diagnosis not present

## 2019-05-19 DIAGNOSIS — R2689 Other abnormalities of gait and mobility: Secondary | ICD-10-CM | POA: Diagnosis not present

## 2019-05-19 DIAGNOSIS — M79661 Pain in right lower leg: Secondary | ICD-10-CM | POA: Diagnosis not present

## 2019-05-19 DIAGNOSIS — R1312 Dysphagia, oropharyngeal phase: Secondary | ICD-10-CM | POA: Diagnosis not present

## 2019-05-19 DIAGNOSIS — M79662 Pain in left lower leg: Secondary | ICD-10-CM | POA: Diagnosis not present

## 2019-05-19 DIAGNOSIS — R1319 Other dysphagia: Secondary | ICD-10-CM | POA: Diagnosis not present

## 2019-05-19 DIAGNOSIS — M6281 Muscle weakness (generalized): Secondary | ICD-10-CM | POA: Diagnosis not present

## 2019-05-19 DIAGNOSIS — R2681 Unsteadiness on feet: Secondary | ICD-10-CM | POA: Diagnosis not present

## 2019-05-19 DIAGNOSIS — J690 Pneumonitis due to inhalation of food and vomit: Secondary | ICD-10-CM | POA: Diagnosis not present

## 2019-05-19 DIAGNOSIS — M24571 Contracture, right ankle: Secondary | ICD-10-CM | POA: Diagnosis not present

## 2019-05-19 DIAGNOSIS — Z9181 History of falling: Secondary | ICD-10-CM | POA: Diagnosis not present

## 2019-05-19 DIAGNOSIS — Z7409 Other reduced mobility: Secondary | ICD-10-CM | POA: Diagnosis not present

## 2019-05-19 DIAGNOSIS — M24561 Contracture, right knee: Secondary | ICD-10-CM | POA: Diagnosis not present

## 2019-05-19 DIAGNOSIS — M2450 Contracture, unspecified joint: Secondary | ICD-10-CM | POA: Diagnosis not present

## 2019-05-19 DIAGNOSIS — J9690 Respiratory failure, unspecified, unspecified whether with hypoxia or hypercapnia: Secondary | ICD-10-CM | POA: Diagnosis not present

## 2019-05-19 DIAGNOSIS — R293 Abnormal posture: Secondary | ICD-10-CM | POA: Diagnosis not present

## 2019-05-19 DIAGNOSIS — J9601 Acute respiratory failure with hypoxia: Secondary | ICD-10-CM | POA: Diagnosis not present

## 2019-05-19 DIAGNOSIS — G9341 Metabolic encephalopathy: Secondary | ICD-10-CM | POA: Diagnosis not present

## 2019-05-19 DIAGNOSIS — J168 Pneumonia due to other specified infectious organisms: Secondary | ICD-10-CM | POA: Diagnosis not present

## 2019-05-19 DIAGNOSIS — L89303 Pressure ulcer of unspecified buttock, stage 3: Secondary | ICD-10-CM | POA: Diagnosis not present

## 2019-05-19 DIAGNOSIS — R278 Other lack of coordination: Secondary | ICD-10-CM | POA: Diagnosis not present

## 2019-05-20 ENCOUNTER — Non-Acute Institutional Stay: Payer: Medicare Other | Admitting: Internal Medicine

## 2019-05-20 DIAGNOSIS — J168 Pneumonia due to other specified infectious organisms: Secondary | ICD-10-CM | POA: Diagnosis not present

## 2019-05-20 DIAGNOSIS — R2681 Unsteadiness on feet: Secondary | ICD-10-CM | POA: Diagnosis not present

## 2019-05-20 DIAGNOSIS — M6281 Muscle weakness (generalized): Secondary | ICD-10-CM | POA: Diagnosis not present

## 2019-05-20 DIAGNOSIS — M2450 Contracture, unspecified joint: Secondary | ICD-10-CM | POA: Diagnosis not present

## 2019-05-20 DIAGNOSIS — Z7409 Other reduced mobility: Secondary | ICD-10-CM | POA: Diagnosis not present

## 2019-05-20 DIAGNOSIS — Z9181 History of falling: Secondary | ICD-10-CM | POA: Diagnosis not present

## 2019-05-20 DIAGNOSIS — J690 Pneumonitis due to inhalation of food and vomit: Secondary | ICD-10-CM

## 2019-05-20 DIAGNOSIS — M24571 Contracture, right ankle: Secondary | ICD-10-CM | POA: Diagnosis not present

## 2019-05-20 DIAGNOSIS — Z515 Encounter for palliative care: Secondary | ICD-10-CM

## 2019-05-20 DIAGNOSIS — M79661 Pain in right lower leg: Secondary | ICD-10-CM | POA: Diagnosis not present

## 2019-05-20 DIAGNOSIS — R2689 Other abnormalities of gait and mobility: Secondary | ICD-10-CM | POA: Diagnosis not present

## 2019-05-20 DIAGNOSIS — R1319 Other dysphagia: Secondary | ICD-10-CM | POA: Diagnosis not present

## 2019-05-20 DIAGNOSIS — J9690 Respiratory failure, unspecified, unspecified whether with hypoxia or hypercapnia: Secondary | ICD-10-CM | POA: Diagnosis not present

## 2019-05-20 DIAGNOSIS — R278 Other lack of coordination: Secondary | ICD-10-CM | POA: Diagnosis not present

## 2019-05-20 DIAGNOSIS — M79662 Pain in left lower leg: Secondary | ICD-10-CM | POA: Diagnosis not present

## 2019-05-20 DIAGNOSIS — R293 Abnormal posture: Secondary | ICD-10-CM | POA: Diagnosis not present

## 2019-05-20 DIAGNOSIS — M24561 Contracture, right knee: Secondary | ICD-10-CM | POA: Diagnosis not present

## 2019-05-20 DIAGNOSIS — J9601 Acute respiratory failure with hypoxia: Secondary | ICD-10-CM | POA: Diagnosis not present

## 2019-05-20 DIAGNOSIS — R1312 Dysphagia, oropharyngeal phase: Secondary | ICD-10-CM | POA: Diagnosis not present

## 2019-05-21 ENCOUNTER — Other Ambulatory Visit: Payer: Self-pay

## 2019-05-21 DIAGNOSIS — Z7409 Other reduced mobility: Secondary | ICD-10-CM | POA: Diagnosis not present

## 2019-05-21 DIAGNOSIS — R2689 Other abnormalities of gait and mobility: Secondary | ICD-10-CM | POA: Diagnosis not present

## 2019-05-21 DIAGNOSIS — J9601 Acute respiratory failure with hypoxia: Secondary | ICD-10-CM | POA: Diagnosis not present

## 2019-05-21 DIAGNOSIS — J9690 Respiratory failure, unspecified, unspecified whether with hypoxia or hypercapnia: Secondary | ICD-10-CM | POA: Diagnosis not present

## 2019-05-21 DIAGNOSIS — R1319 Other dysphagia: Secondary | ICD-10-CM | POA: Diagnosis not present

## 2019-05-21 DIAGNOSIS — R293 Abnormal posture: Secondary | ICD-10-CM | POA: Diagnosis not present

## 2019-05-21 DIAGNOSIS — R2681 Unsteadiness on feet: Secondary | ICD-10-CM | POA: Diagnosis not present

## 2019-05-21 DIAGNOSIS — J168 Pneumonia due to other specified infectious organisms: Secondary | ICD-10-CM | POA: Diagnosis not present

## 2019-05-21 DIAGNOSIS — M79662 Pain in left lower leg: Secondary | ICD-10-CM | POA: Diagnosis not present

## 2019-05-21 DIAGNOSIS — R278 Other lack of coordination: Secondary | ICD-10-CM | POA: Diagnosis not present

## 2019-05-21 DIAGNOSIS — M24571 Contracture, right ankle: Secondary | ICD-10-CM | POA: Diagnosis not present

## 2019-05-21 DIAGNOSIS — M79661 Pain in right lower leg: Secondary | ICD-10-CM | POA: Diagnosis not present

## 2019-05-21 DIAGNOSIS — M24561 Contracture, right knee: Secondary | ICD-10-CM | POA: Diagnosis not present

## 2019-05-21 DIAGNOSIS — M2450 Contracture, unspecified joint: Secondary | ICD-10-CM | POA: Diagnosis not present

## 2019-05-21 DIAGNOSIS — R1312 Dysphagia, oropharyngeal phase: Secondary | ICD-10-CM | POA: Diagnosis not present

## 2019-05-21 DIAGNOSIS — Z9181 History of falling: Secondary | ICD-10-CM | POA: Diagnosis not present

## 2019-05-21 DIAGNOSIS — M6281 Muscle weakness (generalized): Secondary | ICD-10-CM | POA: Diagnosis not present

## 2019-05-22 DIAGNOSIS — M79661 Pain in right lower leg: Secondary | ICD-10-CM | POA: Diagnosis not present

## 2019-05-22 DIAGNOSIS — M24571 Contracture, right ankle: Secondary | ICD-10-CM | POA: Diagnosis not present

## 2019-05-22 DIAGNOSIS — Z7409 Other reduced mobility: Secondary | ICD-10-CM | POA: Diagnosis not present

## 2019-05-22 DIAGNOSIS — R2681 Unsteadiness on feet: Secondary | ICD-10-CM | POA: Diagnosis not present

## 2019-05-22 DIAGNOSIS — R293 Abnormal posture: Secondary | ICD-10-CM | POA: Diagnosis not present

## 2019-05-22 DIAGNOSIS — Z9181 History of falling: Secondary | ICD-10-CM | POA: Diagnosis not present

## 2019-05-22 DIAGNOSIS — R2689 Other abnormalities of gait and mobility: Secondary | ICD-10-CM | POA: Diagnosis not present

## 2019-05-22 DIAGNOSIS — R278 Other lack of coordination: Secondary | ICD-10-CM | POA: Diagnosis not present

## 2019-05-22 DIAGNOSIS — M2450 Contracture, unspecified joint: Secondary | ICD-10-CM | POA: Diagnosis not present

## 2019-05-22 DIAGNOSIS — R1312 Dysphagia, oropharyngeal phase: Secondary | ICD-10-CM | POA: Diagnosis not present

## 2019-05-22 DIAGNOSIS — M6281 Muscle weakness (generalized): Secondary | ICD-10-CM | POA: Diagnosis not present

## 2019-05-22 DIAGNOSIS — R1319 Other dysphagia: Secondary | ICD-10-CM | POA: Diagnosis not present

## 2019-05-22 DIAGNOSIS — M24561 Contracture, right knee: Secondary | ICD-10-CM | POA: Diagnosis not present

## 2019-05-22 DIAGNOSIS — M79662 Pain in left lower leg: Secondary | ICD-10-CM | POA: Diagnosis not present

## 2019-05-22 DIAGNOSIS — J168 Pneumonia due to other specified infectious organisms: Secondary | ICD-10-CM | POA: Diagnosis not present

## 2019-05-22 DIAGNOSIS — J9601 Acute respiratory failure with hypoxia: Secondary | ICD-10-CM | POA: Diagnosis not present

## 2019-05-22 DIAGNOSIS — J9690 Respiratory failure, unspecified, unspecified whether with hypoxia or hypercapnia: Secondary | ICD-10-CM | POA: Diagnosis not present

## 2019-05-22 NOTE — Progress Notes (Signed)
Lee [name]: [phone]  Fax: [phone]  PATIENT NAME: Lee [name] DOB: 1950-05-04 MRN: [id number]  PRIMARY CARE PROVIDER:   Iona Beard, MD  REFERRING PROVIDER:  Iona Beard, MD [address] [place],  [place] [address]  RESPONSIBLE PARTYDaylan, [name] [phone]  [phone]     RECOMMENDATIONS and PLAN:  Palliative Care Encounter  Z51.5  1.  Advance care planning:  Current directives reflect full scope of treatment and attempt CPR after previous discussion with wife.   Plans for re-dicussion with wife since recent recurrent aspiration.  I have left a voice message for wife to call this nurse practitioner for additional updates and clarification of advanced directives.  He would benefit from transition to comfort care due to advanced dementia and numerous aspiration pneumonias.  2.  Dysphagia:  Significant aspiration risk.  Follow aspiration precautions and recommendations of speech therapist.   3.  Vascular dementia:  FAST stage 7b, he will continue to require  full assistance with ADLs.  Continue to monitor for additional cognitive and functional decline.    I spent 40 minutes providing this consultation,  from 1100 to 1140. More than 50% of the time in this consultation was spent coordinating communication patient and clinical staff.   HISTORY OF PRESENT ILLNESS:  Lee [name] is a 69 y.o. year old male with multiple medical problems including advanced vascular dementia with behavioral disturbance, post CVA, dysphagia with multiple episodes of aspiration pneumonia.  He was most recently admitted to the hospital due to aspiration PNA from 5/6-05/17/2019.  He remains nonambulatory, dependent upon staff for all ADLs and feeding, is incontinent of bowel and bladder.  Palliative Care was asked to help address goals of care.   CODE STATUS: FULL CODE  / DNAR noted on hospital records    (Will clarify with wife)  PPS: 30% HOSPICE ELIGIBILITY/DIAGNOSIS: YES/ Advanced vascular dementia with multiple aspiration pneumonias  PAST MEDICAL HISTORY:  Past Medical History:  Diagnosis Date  . Aneurysm of femoral artery ([place])   . Arthritis    "anwhere I've been hurt before" (01/22/2012)  . COPD (chronic obstructive pulmonary disease) ([place])   . Dementia ([place])   . ETOH abuse   . Gout   . Grand mal epilepsy, controlled ([place]) 1980's   last on 10/10/14  . Hypertension   . Kidney stone   . Peripheral vascular disease ([place])   . Seizures ([place])    last grand mal seizures Aug 2018  . Stroke [facility]) Sept. 2012   denies residual (01/22/2012)    SOCIAL HX:  Social History   Tobacco Use  . Smoking status: Former Smoker    Packs/day: 1.00    Years: 42.00    Pack years: 42.00    Types: Cigarettes    Quit date: 09/04/2013    Years since quitting: 5.7  . Smokeless tobacco: Never Used  Substance Use Topics  . Alcohol use: No    ALLERGIES:  Allergies  Allergen Reactions  . Orange Juice [Orange Oil] Diarrhea  . Vicodin [Hydrocodone-Acetaminophen] Other (See Comments)    "triggered seizure both here and at home when he tried to take it" (01/22/2012)  . Penicillins Nausea And Vomiting    Tolerates Zosyn and Unasyn. Has patient had a PCN reaction causing immediate rash, facial/tongue/throat swelling, SOB or lightheadedness with hypotension: no Has patient had a PCN reaction causing severe rash involving mucus membranes or skin necrosis:  no Has patient had a PCN reaction that required hospitalization: no Has patient had a PCN reaction occurring within the last 10 years: no If all of the above answers are "NO", then may proceed with Cephalosporin u  . Oxycodone     Causes Seizures     PERTINENT MEDICATIONS:  Outpatient Encounter Medications as of 05/20/2019  Medication Sig  . acetaminophen (TYLENOL) 325 MG tablet Take 650 mg by mouth every 12 (twelve) hours.  [name] albuterol (VENTOLIN  HFA) 108 (90 Base) MCG/ACT inhaler Inhale 2 puffs into the lungs every 6 (six) hours as needed for wheezing or shortness of breath.   [name] amLODipine (NORVASC) 10 MG tablet Take 1 tablet (10 mg total) by mouth daily.  . [EXPIRED] amoxicillin-clavulanate (AUGMENTIN) 875-125 MG tablet Take 1 tablet by mouth 2 (two) times daily for 3 days.  [name] aspirin 81 MG chewable tablet Chew 81 mg by mouth daily.  . B Complex-C (B-COMPLEX WITH VITAMIN C) tablet Take 1 tablet by mouth daily.  . Carboxymethylcellulose Sodium (ARTIFICIAL TEARS OP) Place 2 drops into the left eye 2 (two) times daily.  . cholecalciferol (VITAMIN D) 1000 units tablet Take 1,000 Units by mouth once a week. On Mondays.  . clonazePAM (KLONOPIN) 0.5 MG tablet Take 0.5 tablets (0.25 mg total) by mouth 2 (two) times daily. 2 pm and 7 pm  . hydrALAZINE (APRESOLINE) 25 MG tablet Take 25 mg by mouth daily as needed (SBP > 195).  . hydrALAZINE (APRESOLINE) 50 MG tablet Take 50 mg by mouth 3 (three) times daily.  . hydroxypropyl methylcellulose / hypromellose (ISOPTO TEARS / GONIOVISC) 2.5 % ophthalmic solution Place 2 drops into the left eye in the morning and at bedtime.  . isosorbide mononitrate (IMDUR) 30 MG 24 hr tablet Take 30 mg by mouth at bedtime.  . Lacosamide (VIMPAT) 150 MG TABS Take 300 mg by mouth in the morning and at bedtime.  . levETIRAcetam (KEPPRA) 100 MG/ML solution Take 15 mLs (1,500 mg total) by mouth 2 (two) times daily.  . magnesium oxide (MAG-OX) 400 MG tablet Take 400 mg by mouth daily.  . Multiple Vitamin (MULTIVITAMIN WITH MINERALS) TABS tablet Take 1 tablet by mouth daily. (Patient not taking: Reported on 05/14/2019)  . NON FORMULARY Apply 1 mL topically in the morning, at noon, and at bedtime. ABH gel (Ativan 1mg 12.5mg /Haldol 1mg ). Apply 1mL topically to forearm three times daily for mood disorder.  omeprazole (PRILOSEC OTC) 20 MG tablet Take 20 mg by mouth daily.  . polyethylene glycol (MIRALAX / GLYCOLAX) 17 g  packet Take 17 g by mouth at bedtime. Take 17 grams mix with 8OZ of apple juice for constipation.  . saccharomyces boulardii (FLORASTOR) 250 MG capsule Take 250 mg by mouth daily.  0m senna (SENOKOT) 8.6 MG TABS tablet Take 2 tablets by mouth at bedtime.   . simvastatin (ZOCOR) 10 MG tablet Take 10 mg by mouth at bedtime.  . valproic acid (DEPAKENE) 250 MG/5ML SOLN solution Take 15 mLs (750 mg total) by mouth every 8 (eight) hours. (Patient taking differently: Take 750 mg by mouth 3 (three) times daily. )   No facility-administered encounter medications on file as of 05/20/2019.    PHYSICAL EXAM:   General: NAD, chronically ill and frail appearing, thin elderly male reclining in bed Cardiovascular: regular rate and rhythm Pulmonary: clear ant fields Abdomen: soft, nontender, + bowel sounds GU: no suprapubic tenderness Extremities: no edema, protective boots in place, no muscle mass Skin: Exposed skin  is intact Neurological: Alert but confused.  Speaks in 2-3 word sentences.  Not oriented Psych: Calm and cooperative  Margaretha Sheffield, NP-C

## 2019-05-23 DIAGNOSIS — R2689 Other abnormalities of gait and mobility: Secondary | ICD-10-CM | POA: Diagnosis not present

## 2019-05-23 DIAGNOSIS — J168 Pneumonia due to other specified infectious organisms: Secondary | ICD-10-CM | POA: Diagnosis not present

## 2019-05-23 DIAGNOSIS — R1312 Dysphagia, oropharyngeal phase: Secondary | ICD-10-CM | POA: Diagnosis not present

## 2019-05-23 DIAGNOSIS — R2681 Unsteadiness on feet: Secondary | ICD-10-CM | POA: Diagnosis not present

## 2019-05-23 DIAGNOSIS — M6281 Muscle weakness (generalized): Secondary | ICD-10-CM | POA: Diagnosis not present

## 2019-05-23 DIAGNOSIS — M79662 Pain in left lower leg: Secondary | ICD-10-CM | POA: Diagnosis not present

## 2019-05-23 DIAGNOSIS — M24561 Contracture, right knee: Secondary | ICD-10-CM | POA: Diagnosis not present

## 2019-05-23 DIAGNOSIS — R293 Abnormal posture: Secondary | ICD-10-CM | POA: Diagnosis not present

## 2019-05-23 DIAGNOSIS — J9601 Acute respiratory failure with hypoxia: Secondary | ICD-10-CM | POA: Diagnosis not present

## 2019-05-23 DIAGNOSIS — J9690 Respiratory failure, unspecified, unspecified whether with hypoxia or hypercapnia: Secondary | ICD-10-CM | POA: Diagnosis not present

## 2019-05-23 DIAGNOSIS — Z9181 History of falling: Secondary | ICD-10-CM | POA: Diagnosis not present

## 2019-05-23 DIAGNOSIS — Z7409 Other reduced mobility: Secondary | ICD-10-CM | POA: Diagnosis not present

## 2019-05-23 DIAGNOSIS — M2450 Contracture, unspecified joint: Secondary | ICD-10-CM | POA: Diagnosis not present

## 2019-05-23 DIAGNOSIS — R1319 Other dysphagia: Secondary | ICD-10-CM | POA: Diagnosis not present

## 2019-05-23 DIAGNOSIS — M79661 Pain in right lower leg: Secondary | ICD-10-CM | POA: Diagnosis not present

## 2019-05-23 DIAGNOSIS — M24571 Contracture, right ankle: Secondary | ICD-10-CM | POA: Diagnosis not present

## 2019-05-23 DIAGNOSIS — R278 Other lack of coordination: Secondary | ICD-10-CM | POA: Diagnosis not present

## 2019-05-24 DIAGNOSIS — M6281 Muscle weakness (generalized): Secondary | ICD-10-CM | POA: Diagnosis not present

## 2019-05-24 DIAGNOSIS — J9601 Acute respiratory failure with hypoxia: Secondary | ICD-10-CM | POA: Diagnosis not present

## 2019-05-24 DIAGNOSIS — R2689 Other abnormalities of gait and mobility: Secondary | ICD-10-CM | POA: Diagnosis not present

## 2019-05-24 DIAGNOSIS — R2681 Unsteadiness on feet: Secondary | ICD-10-CM | POA: Diagnosis not present

## 2019-05-24 DIAGNOSIS — M2450 Contracture, unspecified joint: Secondary | ICD-10-CM | POA: Diagnosis not present

## 2019-05-24 DIAGNOSIS — J9690 Respiratory failure, unspecified, unspecified whether with hypoxia or hypercapnia: Secondary | ICD-10-CM | POA: Diagnosis not present

## 2019-05-24 DIAGNOSIS — J168 Pneumonia due to other specified infectious organisms: Secondary | ICD-10-CM | POA: Diagnosis not present

## 2019-05-24 DIAGNOSIS — R1319 Other dysphagia: Secondary | ICD-10-CM | POA: Diagnosis not present

## 2019-05-24 DIAGNOSIS — M79661 Pain in right lower leg: Secondary | ICD-10-CM | POA: Diagnosis not present

## 2019-05-24 DIAGNOSIS — M79662 Pain in left lower leg: Secondary | ICD-10-CM | POA: Diagnosis not present

## 2019-05-24 DIAGNOSIS — R293 Abnormal posture: Secondary | ICD-10-CM | POA: Diagnosis not present

## 2019-05-24 DIAGNOSIS — R278 Other lack of coordination: Secondary | ICD-10-CM | POA: Diagnosis not present

## 2019-05-24 DIAGNOSIS — M24561 Contracture, right knee: Secondary | ICD-10-CM | POA: Diagnosis not present

## 2019-05-24 DIAGNOSIS — Z9181 History of falling: Secondary | ICD-10-CM | POA: Diagnosis not present

## 2019-05-24 DIAGNOSIS — Z7409 Other reduced mobility: Secondary | ICD-10-CM | POA: Diagnosis not present

## 2019-05-24 DIAGNOSIS — M24571 Contracture, right ankle: Secondary | ICD-10-CM | POA: Diagnosis not present

## 2019-05-24 DIAGNOSIS — R1312 Dysphagia, oropharyngeal phase: Secondary | ICD-10-CM | POA: Diagnosis not present

## 2019-05-25 ENCOUNTER — Non-Acute Institutional Stay: Payer: Medicare Other | Admitting: Internal Medicine

## 2019-05-25 DIAGNOSIS — Z515 Encounter for palliative care: Secondary | ICD-10-CM | POA: Diagnosis not present

## 2019-05-25 DIAGNOSIS — J168 Pneumonia due to other specified infectious organisms: Secondary | ICD-10-CM | POA: Diagnosis not present

## 2019-05-25 DIAGNOSIS — R1312 Dysphagia, oropharyngeal phase: Secondary | ICD-10-CM | POA: Diagnosis not present

## 2019-05-25 DIAGNOSIS — Z7189 Other specified counseling: Secondary | ICD-10-CM | POA: Diagnosis not present

## 2019-05-25 DIAGNOSIS — Z9181 History of falling: Secondary | ICD-10-CM | POA: Diagnosis not present

## 2019-05-25 DIAGNOSIS — J9690 Respiratory failure, unspecified, unspecified whether with hypoxia or hypercapnia: Secondary | ICD-10-CM | POA: Diagnosis not present

## 2019-05-25 DIAGNOSIS — L89153 Pressure ulcer of sacral region, stage 3: Secondary | ICD-10-CM | POA: Diagnosis not present

## 2019-05-25 DIAGNOSIS — R2689 Other abnormalities of gait and mobility: Secondary | ICD-10-CM | POA: Diagnosis not present

## 2019-05-25 DIAGNOSIS — J9601 Acute respiratory failure with hypoxia: Secondary | ICD-10-CM | POA: Diagnosis not present

## 2019-05-25 DIAGNOSIS — R1319 Other dysphagia: Secondary | ICD-10-CM | POA: Diagnosis not present

## 2019-05-25 DIAGNOSIS — Z7409 Other reduced mobility: Secondary | ICD-10-CM | POA: Diagnosis not present

## 2019-05-25 DIAGNOSIS — M79661 Pain in right lower leg: Secondary | ICD-10-CM | POA: Diagnosis not present

## 2019-05-25 DIAGNOSIS — M2450 Contracture, unspecified joint: Secondary | ICD-10-CM | POA: Diagnosis not present

## 2019-05-25 DIAGNOSIS — R293 Abnormal posture: Secondary | ICD-10-CM | POA: Diagnosis not present

## 2019-05-25 DIAGNOSIS — R2681 Unsteadiness on feet: Secondary | ICD-10-CM | POA: Diagnosis not present

## 2019-05-25 DIAGNOSIS — M24561 Contracture, right knee: Secondary | ICD-10-CM | POA: Diagnosis not present

## 2019-05-25 DIAGNOSIS — M6281 Muscle weakness (generalized): Secondary | ICD-10-CM | POA: Diagnosis not present

## 2019-05-25 DIAGNOSIS — M24571 Contracture, right ankle: Secondary | ICD-10-CM | POA: Diagnosis not present

## 2019-05-25 DIAGNOSIS — R278 Other lack of coordination: Secondary | ICD-10-CM | POA: Diagnosis not present

## 2019-05-25 DIAGNOSIS — M79662 Pain in left lower leg: Secondary | ICD-10-CM | POA: Diagnosis not present

## 2019-05-26 DIAGNOSIS — J449 Chronic obstructive pulmonary disease, unspecified: Secondary | ICD-10-CM | POA: Diagnosis not present

## 2019-05-26 DIAGNOSIS — R2681 Unsteadiness on feet: Secondary | ICD-10-CM | POA: Diagnosis not present

## 2019-05-26 DIAGNOSIS — J168 Pneumonia due to other specified infectious organisms: Secondary | ICD-10-CM | POA: Diagnosis not present

## 2019-05-26 DIAGNOSIS — J690 Pneumonitis due to inhalation of food and vomit: Secondary | ICD-10-CM | POA: Diagnosis not present

## 2019-05-26 DIAGNOSIS — R2689 Other abnormalities of gait and mobility: Secondary | ICD-10-CM | POA: Diagnosis not present

## 2019-05-26 DIAGNOSIS — M24571 Contracture, right ankle: Secondary | ICD-10-CM | POA: Diagnosis not present

## 2019-05-26 DIAGNOSIS — R1319 Other dysphagia: Secondary | ICD-10-CM | POA: Diagnosis not present

## 2019-05-26 DIAGNOSIS — R278 Other lack of coordination: Secondary | ICD-10-CM | POA: Diagnosis not present

## 2019-05-26 DIAGNOSIS — R1312 Dysphagia, oropharyngeal phase: Secondary | ICD-10-CM | POA: Diagnosis not present

## 2019-05-26 DIAGNOSIS — R293 Abnormal posture: Secondary | ICD-10-CM | POA: Diagnosis not present

## 2019-05-26 DIAGNOSIS — M79662 Pain in left lower leg: Secondary | ICD-10-CM | POA: Diagnosis not present

## 2019-05-26 DIAGNOSIS — Z9181 History of falling: Secondary | ICD-10-CM | POA: Diagnosis not present

## 2019-05-26 DIAGNOSIS — I10 Essential (primary) hypertension: Secondary | ICD-10-CM | POA: Diagnosis not present

## 2019-05-26 DIAGNOSIS — J9601 Acute respiratory failure with hypoxia: Secondary | ICD-10-CM | POA: Diagnosis not present

## 2019-05-26 DIAGNOSIS — M24561 Contracture, right knee: Secondary | ICD-10-CM | POA: Diagnosis not present

## 2019-05-26 DIAGNOSIS — J9690 Respiratory failure, unspecified, unspecified whether with hypoxia or hypercapnia: Secondary | ICD-10-CM | POA: Diagnosis not present

## 2019-05-26 DIAGNOSIS — M2450 Contracture, unspecified joint: Secondary | ICD-10-CM | POA: Diagnosis not present

## 2019-05-26 DIAGNOSIS — Z7409 Other reduced mobility: Secondary | ICD-10-CM | POA: Diagnosis not present

## 2019-05-26 DIAGNOSIS — M6281 Muscle weakness (generalized): Secondary | ICD-10-CM | POA: Diagnosis not present

## 2019-05-26 DIAGNOSIS — R569 Unspecified convulsions: Secondary | ICD-10-CM | POA: Diagnosis not present

## 2019-05-26 DIAGNOSIS — M79661 Pain in right lower leg: Secondary | ICD-10-CM | POA: Diagnosis not present

## 2019-05-27 DIAGNOSIS — R1312 Dysphagia, oropharyngeal phase: Secondary | ICD-10-CM | POA: Diagnosis not present

## 2019-05-27 DIAGNOSIS — J9601 Acute respiratory failure with hypoxia: Secondary | ICD-10-CM | POA: Diagnosis not present

## 2019-05-27 DIAGNOSIS — R1319 Other dysphagia: Secondary | ICD-10-CM | POA: Diagnosis not present

## 2019-05-27 DIAGNOSIS — R2681 Unsteadiness on feet: Secondary | ICD-10-CM | POA: Diagnosis not present

## 2019-05-27 DIAGNOSIS — Z9181 History of falling: Secondary | ICD-10-CM | POA: Diagnosis not present

## 2019-05-27 DIAGNOSIS — R278 Other lack of coordination: Secondary | ICD-10-CM | POA: Diagnosis not present

## 2019-05-27 DIAGNOSIS — M79661 Pain in right lower leg: Secondary | ICD-10-CM | POA: Diagnosis not present

## 2019-05-27 DIAGNOSIS — R2689 Other abnormalities of gait and mobility: Secondary | ICD-10-CM | POA: Diagnosis not present

## 2019-05-27 DIAGNOSIS — M79662 Pain in left lower leg: Secondary | ICD-10-CM | POA: Diagnosis not present

## 2019-05-27 DIAGNOSIS — J9690 Respiratory failure, unspecified, unspecified whether with hypoxia or hypercapnia: Secondary | ICD-10-CM | POA: Diagnosis not present

## 2019-05-27 DIAGNOSIS — R293 Abnormal posture: Secondary | ICD-10-CM | POA: Diagnosis not present

## 2019-05-27 DIAGNOSIS — J168 Pneumonia due to other specified infectious organisms: Secondary | ICD-10-CM | POA: Diagnosis not present

## 2019-05-27 DIAGNOSIS — M24561 Contracture, right knee: Secondary | ICD-10-CM | POA: Diagnosis not present

## 2019-05-27 DIAGNOSIS — Z7409 Other reduced mobility: Secondary | ICD-10-CM | POA: Diagnosis not present

## 2019-05-27 DIAGNOSIS — M24571 Contracture, right ankle: Secondary | ICD-10-CM | POA: Diagnosis not present

## 2019-05-27 DIAGNOSIS — M2450 Contracture, unspecified joint: Secondary | ICD-10-CM | POA: Diagnosis not present

## 2019-05-27 DIAGNOSIS — M6281 Muscle weakness (generalized): Secondary | ICD-10-CM | POA: Diagnosis not present

## 2019-05-28 DIAGNOSIS — M24571 Contracture, right ankle: Secondary | ICD-10-CM | POA: Diagnosis not present

## 2019-05-28 DIAGNOSIS — R278 Other lack of coordination: Secondary | ICD-10-CM | POA: Diagnosis not present

## 2019-05-28 DIAGNOSIS — R1319 Other dysphagia: Secondary | ICD-10-CM | POA: Diagnosis not present

## 2019-05-28 DIAGNOSIS — J9601 Acute respiratory failure with hypoxia: Secondary | ICD-10-CM | POA: Diagnosis not present

## 2019-05-28 DIAGNOSIS — M79661 Pain in right lower leg: Secondary | ICD-10-CM | POA: Diagnosis not present

## 2019-05-28 DIAGNOSIS — M79662 Pain in left lower leg: Secondary | ICD-10-CM | POA: Diagnosis not present

## 2019-05-28 DIAGNOSIS — R2681 Unsteadiness on feet: Secondary | ICD-10-CM | POA: Diagnosis not present

## 2019-05-28 DIAGNOSIS — J168 Pneumonia due to other specified infectious organisms: Secondary | ICD-10-CM | POA: Diagnosis not present

## 2019-05-28 DIAGNOSIS — M24561 Contracture, right knee: Secondary | ICD-10-CM | POA: Diagnosis not present

## 2019-05-28 DIAGNOSIS — Z9181 History of falling: Secondary | ICD-10-CM | POA: Diagnosis not present

## 2019-05-28 DIAGNOSIS — R2689 Other abnormalities of gait and mobility: Secondary | ICD-10-CM | POA: Diagnosis not present

## 2019-05-28 DIAGNOSIS — Z7409 Other reduced mobility: Secondary | ICD-10-CM | POA: Diagnosis not present

## 2019-05-28 DIAGNOSIS — J9690 Respiratory failure, unspecified, unspecified whether with hypoxia or hypercapnia: Secondary | ICD-10-CM | POA: Diagnosis not present

## 2019-05-28 DIAGNOSIS — R293 Abnormal posture: Secondary | ICD-10-CM | POA: Diagnosis not present

## 2019-05-28 DIAGNOSIS — M6281 Muscle weakness (generalized): Secondary | ICD-10-CM | POA: Diagnosis not present

## 2019-05-28 DIAGNOSIS — R1312 Dysphagia, oropharyngeal phase: Secondary | ICD-10-CM | POA: Diagnosis not present

## 2019-05-28 DIAGNOSIS — M2450 Contracture, unspecified joint: Secondary | ICD-10-CM | POA: Diagnosis not present

## 2019-05-29 DIAGNOSIS — J9690 Respiratory failure, unspecified, unspecified whether with hypoxia or hypercapnia: Secondary | ICD-10-CM | POA: Diagnosis not present

## 2019-05-29 DIAGNOSIS — I10 Essential (primary) hypertension: Secondary | ICD-10-CM | POA: Diagnosis not present

## 2019-05-29 DIAGNOSIS — M24571 Contracture, right ankle: Secondary | ICD-10-CM | POA: Diagnosis not present

## 2019-05-29 DIAGNOSIS — R293 Abnormal posture: Secondary | ICD-10-CM | POA: Diagnosis not present

## 2019-05-29 DIAGNOSIS — Z9181 History of falling: Secondary | ICD-10-CM | POA: Diagnosis not present

## 2019-05-29 DIAGNOSIS — M2450 Contracture, unspecified joint: Secondary | ICD-10-CM | POA: Diagnosis not present

## 2019-05-29 DIAGNOSIS — R2689 Other abnormalities of gait and mobility: Secondary | ICD-10-CM | POA: Diagnosis not present

## 2019-05-29 DIAGNOSIS — M24561 Contracture, right knee: Secondary | ICD-10-CM | POA: Diagnosis not present

## 2019-05-29 DIAGNOSIS — J9601 Acute respiratory failure with hypoxia: Secondary | ICD-10-CM | POA: Diagnosis not present

## 2019-05-29 DIAGNOSIS — R2681 Unsteadiness on feet: Secondary | ICD-10-CM | POA: Diagnosis not present

## 2019-05-29 DIAGNOSIS — Z7409 Other reduced mobility: Secondary | ICD-10-CM | POA: Diagnosis not present

## 2019-05-29 DIAGNOSIS — J168 Pneumonia due to other specified infectious organisms: Secondary | ICD-10-CM | POA: Diagnosis not present

## 2019-05-29 DIAGNOSIS — M6281 Muscle weakness (generalized): Secondary | ICD-10-CM | POA: Diagnosis not present

## 2019-05-29 DIAGNOSIS — R1319 Other dysphagia: Secondary | ICD-10-CM | POA: Diagnosis not present

## 2019-05-29 DIAGNOSIS — M79662 Pain in left lower leg: Secondary | ICD-10-CM | POA: Diagnosis not present

## 2019-05-29 DIAGNOSIS — R1312 Dysphagia, oropharyngeal phase: Secondary | ICD-10-CM | POA: Diagnosis not present

## 2019-05-29 DIAGNOSIS — R278 Other lack of coordination: Secondary | ICD-10-CM | POA: Diagnosis not present

## 2019-05-29 DIAGNOSIS — M79661 Pain in right lower leg: Secondary | ICD-10-CM | POA: Diagnosis not present

## 2019-05-30 DIAGNOSIS — J9690 Respiratory failure, unspecified, unspecified whether with hypoxia or hypercapnia: Secondary | ICD-10-CM | POA: Diagnosis not present

## 2019-05-30 DIAGNOSIS — R293 Abnormal posture: Secondary | ICD-10-CM | POA: Diagnosis not present

## 2019-05-30 DIAGNOSIS — R2689 Other abnormalities of gait and mobility: Secondary | ICD-10-CM | POA: Diagnosis not present

## 2019-05-30 DIAGNOSIS — Z7409 Other reduced mobility: Secondary | ICD-10-CM | POA: Diagnosis not present

## 2019-05-30 DIAGNOSIS — J9601 Acute respiratory failure with hypoxia: Secondary | ICD-10-CM | POA: Diagnosis not present

## 2019-05-30 DIAGNOSIS — R2681 Unsteadiness on feet: Secondary | ICD-10-CM | POA: Diagnosis not present

## 2019-05-30 DIAGNOSIS — M2450 Contracture, unspecified joint: Secondary | ICD-10-CM | POA: Diagnosis not present

## 2019-05-30 DIAGNOSIS — R1319 Other dysphagia: Secondary | ICD-10-CM | POA: Diagnosis not present

## 2019-05-30 DIAGNOSIS — M6281 Muscle weakness (generalized): Secondary | ICD-10-CM | POA: Diagnosis not present

## 2019-05-30 DIAGNOSIS — M79662 Pain in left lower leg: Secondary | ICD-10-CM | POA: Diagnosis not present

## 2019-05-30 DIAGNOSIS — M79661 Pain in right lower leg: Secondary | ICD-10-CM | POA: Diagnosis not present

## 2019-05-30 DIAGNOSIS — M24571 Contracture, right ankle: Secondary | ICD-10-CM | POA: Diagnosis not present

## 2019-05-30 DIAGNOSIS — R278 Other lack of coordination: Secondary | ICD-10-CM | POA: Diagnosis not present

## 2019-05-30 DIAGNOSIS — J168 Pneumonia due to other specified infectious organisms: Secondary | ICD-10-CM | POA: Diagnosis not present

## 2019-05-30 DIAGNOSIS — Z9181 History of falling: Secondary | ICD-10-CM | POA: Diagnosis not present

## 2019-05-30 DIAGNOSIS — R1312 Dysphagia, oropharyngeal phase: Secondary | ICD-10-CM | POA: Diagnosis not present

## 2019-05-30 DIAGNOSIS — M24561 Contracture, right knee: Secondary | ICD-10-CM | POA: Diagnosis not present

## 2019-05-31 DIAGNOSIS — M79662 Pain in left lower leg: Secondary | ICD-10-CM | POA: Diagnosis not present

## 2019-05-31 DIAGNOSIS — J9601 Acute respiratory failure with hypoxia: Secondary | ICD-10-CM | POA: Diagnosis not present

## 2019-05-31 DIAGNOSIS — R2681 Unsteadiness on feet: Secondary | ICD-10-CM | POA: Diagnosis not present

## 2019-05-31 DIAGNOSIS — R1319 Other dysphagia: Secondary | ICD-10-CM | POA: Diagnosis not present

## 2019-05-31 DIAGNOSIS — M24571 Contracture, right ankle: Secondary | ICD-10-CM | POA: Diagnosis not present

## 2019-05-31 DIAGNOSIS — R2689 Other abnormalities of gait and mobility: Secondary | ICD-10-CM | POA: Diagnosis not present

## 2019-05-31 DIAGNOSIS — J9690 Respiratory failure, unspecified, unspecified whether with hypoxia or hypercapnia: Secondary | ICD-10-CM | POA: Diagnosis not present

## 2019-05-31 DIAGNOSIS — M2450 Contracture, unspecified joint: Secondary | ICD-10-CM | POA: Diagnosis not present

## 2019-05-31 DIAGNOSIS — R278 Other lack of coordination: Secondary | ICD-10-CM | POA: Diagnosis not present

## 2019-05-31 DIAGNOSIS — R1312 Dysphagia, oropharyngeal phase: Secondary | ICD-10-CM | POA: Diagnosis not present

## 2019-05-31 DIAGNOSIS — Z9181 History of falling: Secondary | ICD-10-CM | POA: Diagnosis not present

## 2019-05-31 DIAGNOSIS — M6281 Muscle weakness (generalized): Secondary | ICD-10-CM | POA: Diagnosis not present

## 2019-05-31 DIAGNOSIS — M79661 Pain in right lower leg: Secondary | ICD-10-CM | POA: Diagnosis not present

## 2019-05-31 DIAGNOSIS — M24561 Contracture, right knee: Secondary | ICD-10-CM | POA: Diagnosis not present

## 2019-05-31 DIAGNOSIS — Z7409 Other reduced mobility: Secondary | ICD-10-CM | POA: Diagnosis not present

## 2019-05-31 DIAGNOSIS — R293 Abnormal posture: Secondary | ICD-10-CM | POA: Diagnosis not present

## 2019-05-31 DIAGNOSIS — J168 Pneumonia due to other specified infectious organisms: Secondary | ICD-10-CM | POA: Diagnosis not present

## 2019-06-01 DIAGNOSIS — R278 Other lack of coordination: Secondary | ICD-10-CM | POA: Diagnosis not present

## 2019-06-01 DIAGNOSIS — J9601 Acute respiratory failure with hypoxia: Secondary | ICD-10-CM | POA: Diagnosis not present

## 2019-06-01 DIAGNOSIS — R2689 Other abnormalities of gait and mobility: Secondary | ICD-10-CM | POA: Diagnosis not present

## 2019-06-01 DIAGNOSIS — M6281 Muscle weakness (generalized): Secondary | ICD-10-CM | POA: Diagnosis not present

## 2019-06-01 DIAGNOSIS — L89153 Pressure ulcer of sacral region, stage 3: Secondary | ICD-10-CM | POA: Diagnosis not present

## 2019-06-01 DIAGNOSIS — M79675 Pain in left toe(s): Secondary | ICD-10-CM | POA: Diagnosis not present

## 2019-06-01 DIAGNOSIS — M79661 Pain in right lower leg: Secondary | ICD-10-CM | POA: Diagnosis not present

## 2019-06-01 DIAGNOSIS — M2450 Contracture, unspecified joint: Secondary | ICD-10-CM | POA: Diagnosis not present

## 2019-06-01 DIAGNOSIS — R293 Abnormal posture: Secondary | ICD-10-CM | POA: Diagnosis not present

## 2019-06-01 DIAGNOSIS — Z9181 History of falling: Secondary | ICD-10-CM | POA: Diagnosis not present

## 2019-06-01 DIAGNOSIS — B351 Tinea unguium: Secondary | ICD-10-CM | POA: Diagnosis not present

## 2019-06-01 DIAGNOSIS — J168 Pneumonia due to other specified infectious organisms: Secondary | ICD-10-CM | POA: Diagnosis not present

## 2019-06-01 DIAGNOSIS — Z7409 Other reduced mobility: Secondary | ICD-10-CM | POA: Diagnosis not present

## 2019-06-01 DIAGNOSIS — I739 Peripheral vascular disease, unspecified: Secondary | ICD-10-CM | POA: Diagnosis not present

## 2019-06-01 DIAGNOSIS — M24561 Contracture, right knee: Secondary | ICD-10-CM | POA: Diagnosis not present

## 2019-06-01 DIAGNOSIS — R2681 Unsteadiness on feet: Secondary | ICD-10-CM | POA: Diagnosis not present

## 2019-06-01 DIAGNOSIS — J9690 Respiratory failure, unspecified, unspecified whether with hypoxia or hypercapnia: Secondary | ICD-10-CM | POA: Diagnosis not present

## 2019-06-01 DIAGNOSIS — R1312 Dysphagia, oropharyngeal phase: Secondary | ICD-10-CM | POA: Diagnosis not present

## 2019-06-01 DIAGNOSIS — M79662 Pain in left lower leg: Secondary | ICD-10-CM | POA: Diagnosis not present

## 2019-06-01 DIAGNOSIS — R1319 Other dysphagia: Secondary | ICD-10-CM | POA: Diagnosis not present

## 2019-06-01 DIAGNOSIS — M79674 Pain in right toe(s): Secondary | ICD-10-CM | POA: Diagnosis not present

## 2019-06-01 DIAGNOSIS — M24571 Contracture, right ankle: Secondary | ICD-10-CM | POA: Diagnosis not present

## 2019-06-02 DIAGNOSIS — M79661 Pain in right lower leg: Secondary | ICD-10-CM | POA: Diagnosis not present

## 2019-06-02 DIAGNOSIS — J9601 Acute respiratory failure with hypoxia: Secondary | ICD-10-CM | POA: Diagnosis not present

## 2019-06-02 DIAGNOSIS — M79662 Pain in left lower leg: Secondary | ICD-10-CM | POA: Diagnosis not present

## 2019-06-02 DIAGNOSIS — M2450 Contracture, unspecified joint: Secondary | ICD-10-CM | POA: Diagnosis not present

## 2019-06-02 DIAGNOSIS — R2689 Other abnormalities of gait and mobility: Secondary | ICD-10-CM | POA: Diagnosis not present

## 2019-06-02 DIAGNOSIS — J9690 Respiratory failure, unspecified, unspecified whether with hypoxia or hypercapnia: Secondary | ICD-10-CM | POA: Diagnosis not present

## 2019-06-02 DIAGNOSIS — J168 Pneumonia due to other specified infectious organisms: Secondary | ICD-10-CM | POA: Diagnosis not present

## 2019-06-02 DIAGNOSIS — M6281 Muscle weakness (generalized): Secondary | ICD-10-CM | POA: Diagnosis not present

## 2019-06-02 DIAGNOSIS — R2681 Unsteadiness on feet: Secondary | ICD-10-CM | POA: Diagnosis not present

## 2019-06-02 DIAGNOSIS — R278 Other lack of coordination: Secondary | ICD-10-CM | POA: Diagnosis not present

## 2019-06-02 DIAGNOSIS — R1312 Dysphagia, oropharyngeal phase: Secondary | ICD-10-CM | POA: Diagnosis not present

## 2019-06-02 DIAGNOSIS — Z9181 History of falling: Secondary | ICD-10-CM | POA: Diagnosis not present

## 2019-06-02 DIAGNOSIS — R1319 Other dysphagia: Secondary | ICD-10-CM | POA: Diagnosis not present

## 2019-06-02 DIAGNOSIS — R293 Abnormal posture: Secondary | ICD-10-CM | POA: Diagnosis not present

## 2019-06-02 DIAGNOSIS — M24571 Contracture, right ankle: Secondary | ICD-10-CM | POA: Diagnosis not present

## 2019-06-02 DIAGNOSIS — Z7409 Other reduced mobility: Secondary | ICD-10-CM | POA: Diagnosis not present

## 2019-06-02 DIAGNOSIS — M24561 Contracture, right knee: Secondary | ICD-10-CM | POA: Diagnosis not present

## 2019-06-03 DIAGNOSIS — M79662 Pain in left lower leg: Secondary | ICD-10-CM | POA: Diagnosis not present

## 2019-06-03 DIAGNOSIS — Z9181 History of falling: Secondary | ICD-10-CM | POA: Diagnosis not present

## 2019-06-03 DIAGNOSIS — R293 Abnormal posture: Secondary | ICD-10-CM | POA: Diagnosis not present

## 2019-06-03 DIAGNOSIS — M79661 Pain in right lower leg: Secondary | ICD-10-CM | POA: Diagnosis not present

## 2019-06-03 DIAGNOSIS — M24561 Contracture, right knee: Secondary | ICD-10-CM | POA: Diagnosis not present

## 2019-06-03 DIAGNOSIS — M2450 Contracture, unspecified joint: Secondary | ICD-10-CM | POA: Diagnosis not present

## 2019-06-03 DIAGNOSIS — R1319 Other dysphagia: Secondary | ICD-10-CM | POA: Diagnosis not present

## 2019-06-03 DIAGNOSIS — R1312 Dysphagia, oropharyngeal phase: Secondary | ICD-10-CM | POA: Diagnosis not present

## 2019-06-03 DIAGNOSIS — J9601 Acute respiratory failure with hypoxia: Secondary | ICD-10-CM | POA: Diagnosis not present

## 2019-06-03 DIAGNOSIS — J9690 Respiratory failure, unspecified, unspecified whether with hypoxia or hypercapnia: Secondary | ICD-10-CM | POA: Diagnosis not present

## 2019-06-03 DIAGNOSIS — Z7409 Other reduced mobility: Secondary | ICD-10-CM | POA: Diagnosis not present

## 2019-06-03 DIAGNOSIS — R2681 Unsteadiness on feet: Secondary | ICD-10-CM | POA: Diagnosis not present

## 2019-06-03 DIAGNOSIS — J168 Pneumonia due to other specified infectious organisms: Secondary | ICD-10-CM | POA: Diagnosis not present

## 2019-06-03 DIAGNOSIS — R2689 Other abnormalities of gait and mobility: Secondary | ICD-10-CM | POA: Diagnosis not present

## 2019-06-03 DIAGNOSIS — M24571 Contracture, right ankle: Secondary | ICD-10-CM | POA: Diagnosis not present

## 2019-06-03 DIAGNOSIS — M6281 Muscle weakness (generalized): Secondary | ICD-10-CM | POA: Diagnosis not present

## 2019-06-03 DIAGNOSIS — R278 Other lack of coordination: Secondary | ICD-10-CM | POA: Diagnosis not present

## 2019-06-04 DIAGNOSIS — M24571 Contracture, right ankle: Secondary | ICD-10-CM | POA: Diagnosis not present

## 2019-06-04 DIAGNOSIS — Z9181 History of falling: Secondary | ICD-10-CM | POA: Diagnosis not present

## 2019-06-04 DIAGNOSIS — M79662 Pain in left lower leg: Secondary | ICD-10-CM | POA: Diagnosis not present

## 2019-06-04 DIAGNOSIS — Z7409 Other reduced mobility: Secondary | ICD-10-CM | POA: Diagnosis not present

## 2019-06-04 DIAGNOSIS — R293 Abnormal posture: Secondary | ICD-10-CM | POA: Diagnosis not present

## 2019-06-04 DIAGNOSIS — R278 Other lack of coordination: Secondary | ICD-10-CM | POA: Diagnosis not present

## 2019-06-04 DIAGNOSIS — M24561 Contracture, right knee: Secondary | ICD-10-CM | POA: Diagnosis not present

## 2019-06-04 DIAGNOSIS — J9601 Acute respiratory failure with hypoxia: Secondary | ICD-10-CM | POA: Diagnosis not present

## 2019-06-04 DIAGNOSIS — R2689 Other abnormalities of gait and mobility: Secondary | ICD-10-CM | POA: Diagnosis not present

## 2019-06-04 DIAGNOSIS — M2450 Contracture, unspecified joint: Secondary | ICD-10-CM | POA: Diagnosis not present

## 2019-06-04 DIAGNOSIS — M6281 Muscle weakness (generalized): Secondary | ICD-10-CM | POA: Diagnosis not present

## 2019-06-04 DIAGNOSIS — J168 Pneumonia due to other specified infectious organisms: Secondary | ICD-10-CM | POA: Diagnosis not present

## 2019-06-04 DIAGNOSIS — M79661 Pain in right lower leg: Secondary | ICD-10-CM | POA: Diagnosis not present

## 2019-06-04 DIAGNOSIS — J9690 Respiratory failure, unspecified, unspecified whether with hypoxia or hypercapnia: Secondary | ICD-10-CM | POA: Diagnosis not present

## 2019-06-04 DIAGNOSIS — R1312 Dysphagia, oropharyngeal phase: Secondary | ICD-10-CM | POA: Diagnosis not present

## 2019-06-04 DIAGNOSIS — R2681 Unsteadiness on feet: Secondary | ICD-10-CM | POA: Diagnosis not present

## 2019-06-04 DIAGNOSIS — R1319 Other dysphagia: Secondary | ICD-10-CM | POA: Diagnosis not present

## 2019-06-05 DIAGNOSIS — Z7409 Other reduced mobility: Secondary | ICD-10-CM | POA: Diagnosis not present

## 2019-06-05 DIAGNOSIS — M79662 Pain in left lower leg: Secondary | ICD-10-CM | POA: Diagnosis not present

## 2019-06-05 DIAGNOSIS — R1312 Dysphagia, oropharyngeal phase: Secondary | ICD-10-CM | POA: Diagnosis not present

## 2019-06-05 DIAGNOSIS — M6281 Muscle weakness (generalized): Secondary | ICD-10-CM | POA: Diagnosis not present

## 2019-06-05 DIAGNOSIS — J9690 Respiratory failure, unspecified, unspecified whether with hypoxia or hypercapnia: Secondary | ICD-10-CM | POA: Diagnosis not present

## 2019-06-05 DIAGNOSIS — R278 Other lack of coordination: Secondary | ICD-10-CM | POA: Diagnosis not present

## 2019-06-05 DIAGNOSIS — R2689 Other abnormalities of gait and mobility: Secondary | ICD-10-CM | POA: Diagnosis not present

## 2019-06-05 DIAGNOSIS — M24561 Contracture, right knee: Secondary | ICD-10-CM | POA: Diagnosis not present

## 2019-06-05 DIAGNOSIS — R1319 Other dysphagia: Secondary | ICD-10-CM | POA: Diagnosis not present

## 2019-06-05 DIAGNOSIS — J168 Pneumonia due to other specified infectious organisms: Secondary | ICD-10-CM | POA: Diagnosis not present

## 2019-06-05 DIAGNOSIS — R293 Abnormal posture: Secondary | ICD-10-CM | POA: Diagnosis not present

## 2019-06-05 DIAGNOSIS — R2681 Unsteadiness on feet: Secondary | ICD-10-CM | POA: Diagnosis not present

## 2019-06-05 DIAGNOSIS — M24571 Contracture, right ankle: Secondary | ICD-10-CM | POA: Diagnosis not present

## 2019-06-05 DIAGNOSIS — Z9181 History of falling: Secondary | ICD-10-CM | POA: Diagnosis not present

## 2019-06-05 DIAGNOSIS — M2450 Contracture, unspecified joint: Secondary | ICD-10-CM | POA: Diagnosis not present

## 2019-06-05 DIAGNOSIS — M79661 Pain in right lower leg: Secondary | ICD-10-CM | POA: Diagnosis not present

## 2019-06-05 DIAGNOSIS — J9601 Acute respiratory failure with hypoxia: Secondary | ICD-10-CM | POA: Diagnosis not present

## 2019-06-06 DIAGNOSIS — Z9181 History of falling: Secondary | ICD-10-CM | POA: Diagnosis not present

## 2019-06-06 DIAGNOSIS — R293 Abnormal posture: Secondary | ICD-10-CM | POA: Diagnosis not present

## 2019-06-06 DIAGNOSIS — J9601 Acute respiratory failure with hypoxia: Secondary | ICD-10-CM | POA: Diagnosis not present

## 2019-06-06 DIAGNOSIS — M2450 Contracture, unspecified joint: Secondary | ICD-10-CM | POA: Diagnosis not present

## 2019-06-06 DIAGNOSIS — M24571 Contracture, right ankle: Secondary | ICD-10-CM | POA: Diagnosis not present

## 2019-06-06 DIAGNOSIS — R278 Other lack of coordination: Secondary | ICD-10-CM | POA: Diagnosis not present

## 2019-06-06 DIAGNOSIS — M79662 Pain in left lower leg: Secondary | ICD-10-CM | POA: Diagnosis not present

## 2019-06-06 DIAGNOSIS — Z7409 Other reduced mobility: Secondary | ICD-10-CM | POA: Diagnosis not present

## 2019-06-06 DIAGNOSIS — M6281 Muscle weakness (generalized): Secondary | ICD-10-CM | POA: Diagnosis not present

## 2019-06-06 DIAGNOSIS — J9690 Respiratory failure, unspecified, unspecified whether with hypoxia or hypercapnia: Secondary | ICD-10-CM | POA: Diagnosis not present

## 2019-06-06 DIAGNOSIS — M79661 Pain in right lower leg: Secondary | ICD-10-CM | POA: Diagnosis not present

## 2019-06-06 DIAGNOSIS — R2689 Other abnormalities of gait and mobility: Secondary | ICD-10-CM | POA: Diagnosis not present

## 2019-06-06 DIAGNOSIS — J168 Pneumonia due to other specified infectious organisms: Secondary | ICD-10-CM | POA: Diagnosis not present

## 2019-06-06 DIAGNOSIS — R1319 Other dysphagia: Secondary | ICD-10-CM | POA: Diagnosis not present

## 2019-06-06 DIAGNOSIS — R2681 Unsteadiness on feet: Secondary | ICD-10-CM | POA: Diagnosis not present

## 2019-06-06 DIAGNOSIS — R1312 Dysphagia, oropharyngeal phase: Secondary | ICD-10-CM | POA: Diagnosis not present

## 2019-06-06 DIAGNOSIS — M24561 Contracture, right knee: Secondary | ICD-10-CM | POA: Diagnosis not present

## 2019-06-06 NOTE — Progress Notes (Signed)
[facility] Consult Note Telephone: [phone]  Fax: [phone]  PATIENT NAME: Lee [name] DOB: 02-11-1950 MRN: [id number]  PRIMARY CARE PROVIDER:   Iona Beard, MD  REFERRING PROVIDER:  Iona Beard, MD [address] [place],  [place] [address]  RESPONSIBLE PARTYKanon, Nov[place]el [phone]  [phone]     RECOMMENDATIONS and PLAN:  Palliative Care Encounter  Z51.5  1.  Advance care planning: Telephone conversation with wife while at facility.  Updated wife on pt's current health status and risk of recurrent aspirations and decline of health.  When reviewed advanced directives, she stated that she still wanted him "to be a full code and do everything to save his life.  I made a promise to him that I would do that."    Her goal is to continue full scope of treatment of all medical issues.  Palliative care will continue to follow patient.   2.  Dysphagia:  Significant aspiration risk.  Follow aspiration precautions and recommendations of speech therapist.   3.  Vascular dementia:  FAST stage 7c, he will continue to require  full assistance with ADLs.  Continue to monitor for additional cognitive and functional decline and will again recommend comfort care when decline is noted.   I spent 30 minutes providing this consultation,  from 1630 to 1700. More than 50% of the time in this consultation was spent coordinating communication patient and clinical staff, wife [name] and pt.   HISTORY OF PRESENT ILLNESS: Follow-up with Salomon Mast.  Clinical staff report no additional acute health issues since last visit He remains nonambulatory, dependent upon staff for all ADLs and feeding, is incontinent of bowel and bladder. Wife reports that she thinks he is doing very well.  Palliative Care was asked to help address goals of care.   CODE STATUS: FULL CODE    PPS: 30%  HOSPICE ELIGIBILITY/DIAGNOSIS: YES/ Advanced  vascular dementia with multiple aspiration pneumonias.  Wife desires full scope of treatment  PAST MEDICAL HISTORY:  Past Medical History:  Diagnosis Date  . Aneurysm of femoral artery ([place])   . Arthritis    "anwhere I've been hurt before" (01/22/2012)  . COPD (chronic obstructive pulmonary disease) ([employer])   . Dementia ([place])   . ETOH abuse   . Gout   . Grand mal epilepsy, controlled ([place]) 1980's   last on 10/10/14  . Hypertension   . Kidney stone   . Peripheral vascular disease ([place])   . Seizures ([place])    last grand mal seizures Aug 2018  . Stroke [facility]) Sept. 2012   denies residual (01/22/2012)     ALLERGIES:  Allergies  Allergen Reactions  . Orange Juice [Orange Oil] Diarrhea  . Vicodin [Hydrocodone-Acetaminophen] Other (See Comments)    "triggered seizure both here and at home when he tried to take it" (01/22/2012)  . Penicillins Nausea And Vomiting    Tolerates Zosyn and Unasyn. Has patient had a PCN reaction causing immediate rash, facial/tongue/throat swelling, SOB or lightheadedness with hypotension: no Has patient had a PCN reaction causing severe rash involving mucus membranes or skin necrosis: no Has patient had a PCN reaction that required hospitalization: no Has patient had a PCN reaction occurring within the last 10 years: no If all of the above answers are "NO", then may proceed with Cephalosporin u  . Oxycodone     Causes Seizures     PERTINENT MEDICATIONS:  Outpatient Encounter Medications  as of 05/20/2019  Medication Sig  . acetaminophen (TYLENOL) 325 MG tablet Take 650 mg by mouth every 12 (twelve) hours.  [name] albuterol (VENTOLIN HFA) 108 (90 Base) MCG/ACT inhaler Inhale 2 puffs into the lungs every 6 (six) hours as needed for wheezing or shortness of breath.   [name] amLODipine (NORVASC) 10 MG tablet Take 1 tablet (10 mg total) by mouth daily.  . [EXPIRED] amoxicillin-clavulanate (AUGMENTIN) 875-125 MG tablet Take 1 tablet by mouth 2 (two) times daily for 3 days.  [name]  aspirin 81 MG chewable tablet Chew 81 mg by mouth daily.  . B Complex-C (B-COMPLEX WITH VITAMIN C) tablet Take 1 tablet by mouth daily.  . Carboxymethylcellulose Sodium (ARTIFICIAL TEARS OP) Place 2 drops into the left eye 2 (two) times daily.  . cholecalciferol (VITAMIN D) 1000 units tablet Take 1,000 Units by mouth once a week. On Mondays.  . clonazePAM (KLONOPIN) 0.5 MG tablet Take 0.5 tablets (0.25 mg total) by mouth 2 (two) times daily. 2 pm and 7 pm  . hydrALAZINE (APRESOLINE) 25 MG tablet Take 25 mg by mouth daily as needed (SBP > 195).  . hydrALAZINE (APRESOLINE) 50 MG tablet Take 50 mg by mouth 3 (three) times daily.  . hydroxypropyl methylcellulose / hypromellose (ISOPTO TEARS / GONIOVISC) 2.5 % ophthalmic solution Place 2 drops into the left eye in the morning and at bedtime.  . isosorbide mononitrate (IMDUR) 30 MG 24 hr tablet Take 30 mg by mouth at bedtime.  . Lacosamide (VIMPAT) 150 MG TABS Take 300 mg by mouth in the morning and at bedtime.  . levETIRAcetam (KEPPRA) 100 MG/ML solution Take 15 mLs (1,500 mg total) by mouth 2 (two) times daily.  . magnesium oxide (MAG-OX) 400 MG tablet Take 400 mg by mouth daily.  . Multiple Vitamin (MULTIVITAMIN WITH MINERALS) TABS tablet Take 1 tablet by mouth daily. (Patient not taking: Reported on 05/14/2019)  . NON FORMULARY Apply 1 mL topically in the morning, at noon, and at bedtime. ABH gel (Ativan 1mg 12.5mg /Haldol 1mg ). Apply 69mL topically to forearm three times daily for mood disorder.  omeprazole (PRILOSEC OTC) 20 MG tablet Take 20 mg by mouth daily.  . polyethylene glycol (MIRALAX / GLYCOLAX) 17 g packet Take 17 g by mouth at bedtime. Take 17 grams mix with 8OZ of apple juice for constipation.  . saccharomyces boulardii (FLORASTOR) 250 MG capsule Take 250 mg by mouth daily.  0m senna (SENOKOT) 8.6 MG TABS tablet Take 2 tablets by mouth at bedtime.   . simvastatin (ZOCOR) 10 MG tablet Take 10 mg by mouth at bedtime.  . valproic  acid (DEPAKENE) 250 MG/5ML SOLN solution Take 15 mLs (750 mg total) by mouth every 8 (eight) hours. (Patient taking differently: Take 750 mg by mouth 3 (three) times daily. )    PHYSICAL EXAM:   General: NAD, chronically ill and frail appearing, thin elderly male reclining in bed Pulmonary: no increased respiratory effort Extremities: no edema, protective boots in place, no muscle mass Skin: Exposed skin is intact Neurological: Alert, unable to determine orientation due to cognitive decline Psych: agitated, refusal to have conversation  [name], NP-C

## 2019-06-07 DIAGNOSIS — J168 Pneumonia due to other specified infectious organisms: Secondary | ICD-10-CM | POA: Diagnosis not present

## 2019-06-07 DIAGNOSIS — J9601 Acute respiratory failure with hypoxia: Secondary | ICD-10-CM | POA: Diagnosis not present

## 2019-06-07 DIAGNOSIS — R2689 Other abnormalities of gait and mobility: Secondary | ICD-10-CM | POA: Diagnosis not present

## 2019-06-07 DIAGNOSIS — M24561 Contracture, right knee: Secondary | ICD-10-CM | POA: Diagnosis not present

## 2019-06-07 DIAGNOSIS — R2681 Unsteadiness on feet: Secondary | ICD-10-CM | POA: Diagnosis not present

## 2019-06-07 DIAGNOSIS — J9690 Respiratory failure, unspecified, unspecified whether with hypoxia or hypercapnia: Secondary | ICD-10-CM | POA: Diagnosis not present

## 2019-06-07 DIAGNOSIS — M2450 Contracture, unspecified joint: Secondary | ICD-10-CM | POA: Diagnosis not present

## 2019-06-07 DIAGNOSIS — M6281 Muscle weakness (generalized): Secondary | ICD-10-CM | POA: Diagnosis not present

## 2019-06-07 DIAGNOSIS — R293 Abnormal posture: Secondary | ICD-10-CM | POA: Diagnosis not present

## 2019-06-07 DIAGNOSIS — M79661 Pain in right lower leg: Secondary | ICD-10-CM | POA: Diagnosis not present

## 2019-06-07 DIAGNOSIS — R1312 Dysphagia, oropharyngeal phase: Secondary | ICD-10-CM | POA: Diagnosis not present

## 2019-06-07 DIAGNOSIS — M24571 Contracture, right ankle: Secondary | ICD-10-CM | POA: Diagnosis not present

## 2019-06-07 DIAGNOSIS — R1319 Other dysphagia: Secondary | ICD-10-CM | POA: Diagnosis not present

## 2019-06-07 DIAGNOSIS — Z7409 Other reduced mobility: Secondary | ICD-10-CM | POA: Diagnosis not present

## 2019-06-07 DIAGNOSIS — M79662 Pain in left lower leg: Secondary | ICD-10-CM | POA: Diagnosis not present

## 2019-06-07 DIAGNOSIS — Z9181 History of falling: Secondary | ICD-10-CM | POA: Diagnosis not present

## 2019-06-07 DIAGNOSIS — R278 Other lack of coordination: Secondary | ICD-10-CM | POA: Diagnosis not present

## 2019-06-08 DIAGNOSIS — R293 Abnormal posture: Secondary | ICD-10-CM | POA: Diagnosis not present

## 2019-06-08 DIAGNOSIS — J168 Pneumonia due to other specified infectious organisms: Secondary | ICD-10-CM | POA: Diagnosis not present

## 2019-06-08 DIAGNOSIS — Z9181 History of falling: Secondary | ICD-10-CM | POA: Diagnosis not present

## 2019-06-08 DIAGNOSIS — J9601 Acute respiratory failure with hypoxia: Secondary | ICD-10-CM | POA: Diagnosis not present

## 2019-06-08 DIAGNOSIS — M79661 Pain in right lower leg: Secondary | ICD-10-CM | POA: Diagnosis not present

## 2019-06-08 DIAGNOSIS — R2681 Unsteadiness on feet: Secondary | ICD-10-CM | POA: Diagnosis not present

## 2019-06-08 DIAGNOSIS — L89153 Pressure ulcer of sacral region, stage 3: Secondary | ICD-10-CM | POA: Diagnosis not present

## 2019-06-08 DIAGNOSIS — M24561 Contracture, right knee: Secondary | ICD-10-CM | POA: Diagnosis not present

## 2019-06-08 DIAGNOSIS — R278 Other lack of coordination: Secondary | ICD-10-CM | POA: Diagnosis not present

## 2019-06-08 DIAGNOSIS — M6281 Muscle weakness (generalized): Secondary | ICD-10-CM | POA: Diagnosis not present

## 2019-06-08 DIAGNOSIS — Z7409 Other reduced mobility: Secondary | ICD-10-CM | POA: Diagnosis not present

## 2019-06-08 DIAGNOSIS — M2450 Contracture, unspecified joint: Secondary | ICD-10-CM | POA: Diagnosis not present

## 2019-06-08 DIAGNOSIS — R1312 Dysphagia, oropharyngeal phase: Secondary | ICD-10-CM | POA: Diagnosis not present

## 2019-06-08 DIAGNOSIS — M79662 Pain in left lower leg: Secondary | ICD-10-CM | POA: Diagnosis not present

## 2019-06-08 DIAGNOSIS — R2689 Other abnormalities of gait and mobility: Secondary | ICD-10-CM | POA: Diagnosis not present

## 2019-06-08 DIAGNOSIS — J9690 Respiratory failure, unspecified, unspecified whether with hypoxia or hypercapnia: Secondary | ICD-10-CM | POA: Diagnosis not present

## 2019-06-08 DIAGNOSIS — M24571 Contracture, right ankle: Secondary | ICD-10-CM | POA: Diagnosis not present

## 2019-06-08 DIAGNOSIS — R1319 Other dysphagia: Secondary | ICD-10-CM | POA: Diagnosis not present

## 2019-06-09 DIAGNOSIS — Z9181 History of falling: Secondary | ICD-10-CM | POA: Diagnosis not present

## 2019-06-09 DIAGNOSIS — J9690 Respiratory failure, unspecified, unspecified whether with hypoxia or hypercapnia: Secondary | ICD-10-CM | POA: Diagnosis not present

## 2019-06-09 DIAGNOSIS — M79662 Pain in left lower leg: Secondary | ICD-10-CM | POA: Diagnosis not present

## 2019-06-09 DIAGNOSIS — R2681 Unsteadiness on feet: Secondary | ICD-10-CM | POA: Diagnosis not present

## 2019-06-09 DIAGNOSIS — G9341 Metabolic encephalopathy: Secondary | ICD-10-CM | POA: Diagnosis not present

## 2019-06-09 DIAGNOSIS — M24561 Contracture, right knee: Secondary | ICD-10-CM | POA: Diagnosis not present

## 2019-06-09 DIAGNOSIS — R1312 Dysphagia, oropharyngeal phase: Secondary | ICD-10-CM | POA: Diagnosis not present

## 2019-06-09 DIAGNOSIS — M6281 Muscle weakness (generalized): Secondary | ICD-10-CM | POA: Diagnosis not present

## 2019-06-09 DIAGNOSIS — M79661 Pain in right lower leg: Secondary | ICD-10-CM | POA: Diagnosis not present

## 2019-06-09 DIAGNOSIS — Z7409 Other reduced mobility: Secondary | ICD-10-CM | POA: Diagnosis not present

## 2019-06-09 DIAGNOSIS — R2689 Other abnormalities of gait and mobility: Secondary | ICD-10-CM | POA: Diagnosis not present

## 2019-06-09 DIAGNOSIS — L89153 Pressure ulcer of sacral region, stage 3: Secondary | ICD-10-CM | POA: Diagnosis not present

## 2019-06-09 DIAGNOSIS — R278 Other lack of coordination: Secondary | ICD-10-CM | POA: Diagnosis not present

## 2019-06-09 DIAGNOSIS — J168 Pneumonia due to other specified infectious organisms: Secondary | ICD-10-CM | POA: Diagnosis not present

## 2019-06-09 DIAGNOSIS — R1319 Other dysphagia: Secondary | ICD-10-CM | POA: Diagnosis not present

## 2019-06-09 DIAGNOSIS — R293 Abnormal posture: Secondary | ICD-10-CM | POA: Diagnosis not present

## 2019-06-09 DIAGNOSIS — M24571 Contracture, right ankle: Secondary | ICD-10-CM | POA: Diagnosis not present

## 2019-06-09 DIAGNOSIS — M2450 Contracture, unspecified joint: Secondary | ICD-10-CM | POA: Diagnosis not present

## 2019-06-09 DIAGNOSIS — J9601 Acute respiratory failure with hypoxia: Secondary | ICD-10-CM | POA: Diagnosis not present

## 2019-06-10 DIAGNOSIS — R2689 Other abnormalities of gait and mobility: Secondary | ICD-10-CM | POA: Diagnosis not present

## 2019-06-10 DIAGNOSIS — J9690 Respiratory failure, unspecified, unspecified whether with hypoxia or hypercapnia: Secondary | ICD-10-CM | POA: Diagnosis not present

## 2019-06-10 DIAGNOSIS — R278 Other lack of coordination: Secondary | ICD-10-CM | POA: Diagnosis not present

## 2019-06-10 DIAGNOSIS — J168 Pneumonia due to other specified infectious organisms: Secondary | ICD-10-CM | POA: Diagnosis not present

## 2019-06-10 DIAGNOSIS — R293 Abnormal posture: Secondary | ICD-10-CM | POA: Diagnosis not present

## 2019-06-10 DIAGNOSIS — Z9181 History of falling: Secondary | ICD-10-CM | POA: Diagnosis not present

## 2019-06-10 DIAGNOSIS — M2450 Contracture, unspecified joint: Secondary | ICD-10-CM | POA: Diagnosis not present

## 2019-06-10 DIAGNOSIS — M79662 Pain in left lower leg: Secondary | ICD-10-CM | POA: Diagnosis not present

## 2019-06-10 DIAGNOSIS — Z7409 Other reduced mobility: Secondary | ICD-10-CM | POA: Diagnosis not present

## 2019-06-10 DIAGNOSIS — J9601 Acute respiratory failure with hypoxia: Secondary | ICD-10-CM | POA: Diagnosis not present

## 2019-06-10 DIAGNOSIS — M79661 Pain in right lower leg: Secondary | ICD-10-CM | POA: Diagnosis not present

## 2019-06-10 DIAGNOSIS — R2681 Unsteadiness on feet: Secondary | ICD-10-CM | POA: Diagnosis not present

## 2019-06-10 DIAGNOSIS — M24571 Contracture, right ankle: Secondary | ICD-10-CM | POA: Diagnosis not present

## 2019-06-10 DIAGNOSIS — R1319 Other dysphagia: Secondary | ICD-10-CM | POA: Diagnosis not present

## 2019-06-10 DIAGNOSIS — G9341 Metabolic encephalopathy: Secondary | ICD-10-CM | POA: Diagnosis not present

## 2019-06-10 DIAGNOSIS — M24561 Contracture, right knee: Secondary | ICD-10-CM | POA: Diagnosis not present

## 2019-06-10 DIAGNOSIS — M6281 Muscle weakness (generalized): Secondary | ICD-10-CM | POA: Diagnosis not present

## 2019-06-10 DIAGNOSIS — R1312 Dysphagia, oropharyngeal phase: Secondary | ICD-10-CM | POA: Diagnosis not present

## 2019-06-11 DIAGNOSIS — G9341 Metabolic encephalopathy: Secondary | ICD-10-CM | POA: Diagnosis not present

## 2019-06-11 DIAGNOSIS — J168 Pneumonia due to other specified infectious organisms: Secondary | ICD-10-CM | POA: Diagnosis not present

## 2019-06-11 DIAGNOSIS — J9690 Respiratory failure, unspecified, unspecified whether with hypoxia or hypercapnia: Secondary | ICD-10-CM | POA: Diagnosis not present

## 2019-06-11 DIAGNOSIS — M2450 Contracture, unspecified joint: Secondary | ICD-10-CM | POA: Diagnosis not present

## 2019-06-11 DIAGNOSIS — J9601 Acute respiratory failure with hypoxia: Secondary | ICD-10-CM | POA: Diagnosis not present

## 2019-06-11 DIAGNOSIS — M24571 Contracture, right ankle: Secondary | ICD-10-CM | POA: Diagnosis not present

## 2019-06-11 DIAGNOSIS — R2681 Unsteadiness on feet: Secondary | ICD-10-CM | POA: Diagnosis not present

## 2019-06-11 DIAGNOSIS — R1312 Dysphagia, oropharyngeal phase: Secondary | ICD-10-CM | POA: Diagnosis not present

## 2019-06-11 DIAGNOSIS — R1319 Other dysphagia: Secondary | ICD-10-CM | POA: Diagnosis not present

## 2019-06-11 DIAGNOSIS — Z7409 Other reduced mobility: Secondary | ICD-10-CM | POA: Diagnosis not present

## 2019-06-11 DIAGNOSIS — M24561 Contracture, right knee: Secondary | ICD-10-CM | POA: Diagnosis not present

## 2019-06-11 DIAGNOSIS — Z9181 History of falling: Secondary | ICD-10-CM | POA: Diagnosis not present

## 2019-06-11 DIAGNOSIS — M6281 Muscle weakness (generalized): Secondary | ICD-10-CM | POA: Diagnosis not present

## 2019-06-11 DIAGNOSIS — R293 Abnormal posture: Secondary | ICD-10-CM | POA: Diagnosis not present

## 2019-06-11 DIAGNOSIS — R278 Other lack of coordination: Secondary | ICD-10-CM | POA: Diagnosis not present

## 2019-06-11 DIAGNOSIS — R2689 Other abnormalities of gait and mobility: Secondary | ICD-10-CM | POA: Diagnosis not present

## 2019-06-11 DIAGNOSIS — M79661 Pain in right lower leg: Secondary | ICD-10-CM | POA: Diagnosis not present

## 2019-06-11 DIAGNOSIS — M79662 Pain in left lower leg: Secondary | ICD-10-CM | POA: Diagnosis not present

## 2019-06-12 DIAGNOSIS — Z7409 Other reduced mobility: Secondary | ICD-10-CM | POA: Diagnosis not present

## 2019-06-12 DIAGNOSIS — R1312 Dysphagia, oropharyngeal phase: Secondary | ICD-10-CM | POA: Diagnosis not present

## 2019-06-12 DIAGNOSIS — J9690 Respiratory failure, unspecified, unspecified whether with hypoxia or hypercapnia: Secondary | ICD-10-CM | POA: Diagnosis not present

## 2019-06-12 DIAGNOSIS — R1319 Other dysphagia: Secondary | ICD-10-CM | POA: Diagnosis not present

## 2019-06-12 DIAGNOSIS — J9601 Acute respiratory failure with hypoxia: Secondary | ICD-10-CM | POA: Diagnosis not present

## 2019-06-12 DIAGNOSIS — M24561 Contracture, right knee: Secondary | ICD-10-CM | POA: Diagnosis not present

## 2019-06-12 DIAGNOSIS — J168 Pneumonia due to other specified infectious organisms: Secondary | ICD-10-CM | POA: Diagnosis not present

## 2019-06-12 DIAGNOSIS — G9341 Metabolic encephalopathy: Secondary | ICD-10-CM | POA: Diagnosis not present

## 2019-06-12 DIAGNOSIS — Z9181 History of falling: Secondary | ICD-10-CM | POA: Diagnosis not present

## 2019-06-12 DIAGNOSIS — R2681 Unsteadiness on feet: Secondary | ICD-10-CM | POA: Diagnosis not present

## 2019-06-12 DIAGNOSIS — M6281 Muscle weakness (generalized): Secondary | ICD-10-CM | POA: Diagnosis not present

## 2019-06-12 DIAGNOSIS — M24571 Contracture, right ankle: Secondary | ICD-10-CM | POA: Diagnosis not present

## 2019-06-12 DIAGNOSIS — M2450 Contracture, unspecified joint: Secondary | ICD-10-CM | POA: Diagnosis not present

## 2019-06-12 DIAGNOSIS — M79661 Pain in right lower leg: Secondary | ICD-10-CM | POA: Diagnosis not present

## 2019-06-12 DIAGNOSIS — R2689 Other abnormalities of gait and mobility: Secondary | ICD-10-CM | POA: Diagnosis not present

## 2019-06-12 DIAGNOSIS — M79662 Pain in left lower leg: Secondary | ICD-10-CM | POA: Diagnosis not present

## 2019-06-12 DIAGNOSIS — R293 Abnormal posture: Secondary | ICD-10-CM | POA: Diagnosis not present

## 2019-06-12 DIAGNOSIS — R278 Other lack of coordination: Secondary | ICD-10-CM | POA: Diagnosis not present

## 2019-06-13 DIAGNOSIS — M79661 Pain in right lower leg: Secondary | ICD-10-CM | POA: Diagnosis not present

## 2019-06-13 DIAGNOSIS — J9601 Acute respiratory failure with hypoxia: Secondary | ICD-10-CM | POA: Diagnosis not present

## 2019-06-13 DIAGNOSIS — R1312 Dysphagia, oropharyngeal phase: Secondary | ICD-10-CM | POA: Diagnosis not present

## 2019-06-13 DIAGNOSIS — M79662 Pain in left lower leg: Secondary | ICD-10-CM | POA: Diagnosis not present

## 2019-06-13 DIAGNOSIS — J168 Pneumonia due to other specified infectious organisms: Secondary | ICD-10-CM | POA: Diagnosis not present

## 2019-06-13 DIAGNOSIS — R2681 Unsteadiness on feet: Secondary | ICD-10-CM | POA: Diagnosis not present

## 2019-06-13 DIAGNOSIS — R293 Abnormal posture: Secondary | ICD-10-CM | POA: Diagnosis not present

## 2019-06-13 DIAGNOSIS — R1319 Other dysphagia: Secondary | ICD-10-CM | POA: Diagnosis not present

## 2019-06-13 DIAGNOSIS — R2689 Other abnormalities of gait and mobility: Secondary | ICD-10-CM | POA: Diagnosis not present

## 2019-06-13 DIAGNOSIS — M24561 Contracture, right knee: Secondary | ICD-10-CM | POA: Diagnosis not present

## 2019-06-13 DIAGNOSIS — M6281 Muscle weakness (generalized): Secondary | ICD-10-CM | POA: Diagnosis not present

## 2019-06-13 DIAGNOSIS — Z9181 History of falling: Secondary | ICD-10-CM | POA: Diagnosis not present

## 2019-06-13 DIAGNOSIS — M2450 Contracture, unspecified joint: Secondary | ICD-10-CM | POA: Diagnosis not present

## 2019-06-13 DIAGNOSIS — R278 Other lack of coordination: Secondary | ICD-10-CM | POA: Diagnosis not present

## 2019-06-13 DIAGNOSIS — M24571 Contracture, right ankle: Secondary | ICD-10-CM | POA: Diagnosis not present

## 2019-06-13 DIAGNOSIS — J9690 Respiratory failure, unspecified, unspecified whether with hypoxia or hypercapnia: Secondary | ICD-10-CM | POA: Diagnosis not present

## 2019-06-13 DIAGNOSIS — G9341 Metabolic encephalopathy: Secondary | ICD-10-CM | POA: Diagnosis not present

## 2019-06-13 DIAGNOSIS — Z7409 Other reduced mobility: Secondary | ICD-10-CM | POA: Diagnosis not present

## 2019-06-14 DIAGNOSIS — M79661 Pain in right lower leg: Secondary | ICD-10-CM | POA: Diagnosis not present

## 2019-06-14 DIAGNOSIS — G9341 Metabolic encephalopathy: Secondary | ICD-10-CM | POA: Diagnosis not present

## 2019-06-14 DIAGNOSIS — R293 Abnormal posture: Secondary | ICD-10-CM | POA: Diagnosis not present

## 2019-06-14 DIAGNOSIS — M6281 Muscle weakness (generalized): Secondary | ICD-10-CM | POA: Diagnosis not present

## 2019-06-14 DIAGNOSIS — M79662 Pain in left lower leg: Secondary | ICD-10-CM | POA: Diagnosis not present

## 2019-06-14 DIAGNOSIS — J9690 Respiratory failure, unspecified, unspecified whether with hypoxia or hypercapnia: Secondary | ICD-10-CM | POA: Diagnosis not present

## 2019-06-14 DIAGNOSIS — J168 Pneumonia due to other specified infectious organisms: Secondary | ICD-10-CM | POA: Diagnosis not present

## 2019-06-14 DIAGNOSIS — R2689 Other abnormalities of gait and mobility: Secondary | ICD-10-CM | POA: Diagnosis not present

## 2019-06-14 DIAGNOSIS — Z7409 Other reduced mobility: Secondary | ICD-10-CM | POA: Diagnosis not present

## 2019-06-14 DIAGNOSIS — M24571 Contracture, right ankle: Secondary | ICD-10-CM | POA: Diagnosis not present

## 2019-06-14 DIAGNOSIS — Z9181 History of falling: Secondary | ICD-10-CM | POA: Diagnosis not present

## 2019-06-14 DIAGNOSIS — M24561 Contracture, right knee: Secondary | ICD-10-CM | POA: Diagnosis not present

## 2019-06-14 DIAGNOSIS — R1312 Dysphagia, oropharyngeal phase: Secondary | ICD-10-CM | POA: Diagnosis not present

## 2019-06-14 DIAGNOSIS — R1319 Other dysphagia: Secondary | ICD-10-CM | POA: Diagnosis not present

## 2019-06-14 DIAGNOSIS — R2681 Unsteadiness on feet: Secondary | ICD-10-CM | POA: Diagnosis not present

## 2019-06-14 DIAGNOSIS — J9601 Acute respiratory failure with hypoxia: Secondary | ICD-10-CM | POA: Diagnosis not present

## 2019-06-14 DIAGNOSIS — M2450 Contracture, unspecified joint: Secondary | ICD-10-CM | POA: Diagnosis not present

## 2019-06-14 DIAGNOSIS — R278 Other lack of coordination: Secondary | ICD-10-CM | POA: Diagnosis not present

## 2019-06-15 DIAGNOSIS — R293 Abnormal posture: Secondary | ICD-10-CM | POA: Diagnosis not present

## 2019-06-15 DIAGNOSIS — M2450 Contracture, unspecified joint: Secondary | ICD-10-CM | POA: Diagnosis not present

## 2019-06-15 DIAGNOSIS — M24561 Contracture, right knee: Secondary | ICD-10-CM | POA: Diagnosis not present

## 2019-06-15 DIAGNOSIS — G9341 Metabolic encephalopathy: Secondary | ICD-10-CM | POA: Diagnosis not present

## 2019-06-15 DIAGNOSIS — M79662 Pain in left lower leg: Secondary | ICD-10-CM | POA: Diagnosis not present

## 2019-06-15 DIAGNOSIS — Z7409 Other reduced mobility: Secondary | ICD-10-CM | POA: Diagnosis not present

## 2019-06-15 DIAGNOSIS — M79661 Pain in right lower leg: Secondary | ICD-10-CM | POA: Diagnosis not present

## 2019-06-15 DIAGNOSIS — M6281 Muscle weakness (generalized): Secondary | ICD-10-CM | POA: Diagnosis not present

## 2019-06-15 DIAGNOSIS — Z9181 History of falling: Secondary | ICD-10-CM | POA: Diagnosis not present

## 2019-06-15 DIAGNOSIS — M24571 Contracture, right ankle: Secondary | ICD-10-CM | POA: Diagnosis not present

## 2019-06-15 DIAGNOSIS — J168 Pneumonia due to other specified infectious organisms: Secondary | ICD-10-CM | POA: Diagnosis not present

## 2019-06-15 DIAGNOSIS — R2689 Other abnormalities of gait and mobility: Secondary | ICD-10-CM | POA: Diagnosis not present

## 2019-06-15 DIAGNOSIS — R2681 Unsteadiness on feet: Secondary | ICD-10-CM | POA: Diagnosis not present

## 2019-06-15 DIAGNOSIS — R1312 Dysphagia, oropharyngeal phase: Secondary | ICD-10-CM | POA: Diagnosis not present

## 2019-06-15 DIAGNOSIS — J9690 Respiratory failure, unspecified, unspecified whether with hypoxia or hypercapnia: Secondary | ICD-10-CM | POA: Diagnosis not present

## 2019-06-15 DIAGNOSIS — L89153 Pressure ulcer of sacral region, stage 3: Secondary | ICD-10-CM | POA: Diagnosis not present

## 2019-06-15 DIAGNOSIS — J9601 Acute respiratory failure with hypoxia: Secondary | ICD-10-CM | POA: Diagnosis not present

## 2019-06-15 DIAGNOSIS — R278 Other lack of coordination: Secondary | ICD-10-CM | POA: Diagnosis not present

## 2019-06-15 DIAGNOSIS — R1319 Other dysphagia: Secondary | ICD-10-CM | POA: Diagnosis not present

## 2019-06-16 DIAGNOSIS — R1312 Dysphagia, oropharyngeal phase: Secondary | ICD-10-CM | POA: Diagnosis not present

## 2019-06-16 DIAGNOSIS — R293 Abnormal posture: Secondary | ICD-10-CM | POA: Diagnosis not present

## 2019-06-16 DIAGNOSIS — J9690 Respiratory failure, unspecified, unspecified whether with hypoxia or hypercapnia: Secondary | ICD-10-CM | POA: Diagnosis not present

## 2019-06-16 DIAGNOSIS — R2681 Unsteadiness on feet: Secondary | ICD-10-CM | POA: Diagnosis not present

## 2019-06-16 DIAGNOSIS — J9601 Acute respiratory failure with hypoxia: Secondary | ICD-10-CM | POA: Diagnosis not present

## 2019-06-16 DIAGNOSIS — G9341 Metabolic encephalopathy: Secondary | ICD-10-CM | POA: Diagnosis not present

## 2019-06-16 DIAGNOSIS — M24561 Contracture, right knee: Secondary | ICD-10-CM | POA: Diagnosis not present

## 2019-06-16 DIAGNOSIS — R1319 Other dysphagia: Secondary | ICD-10-CM | POA: Diagnosis not present

## 2019-06-16 DIAGNOSIS — R2689 Other abnormalities of gait and mobility: Secondary | ICD-10-CM | POA: Diagnosis not present

## 2019-06-16 DIAGNOSIS — M79662 Pain in left lower leg: Secondary | ICD-10-CM | POA: Diagnosis not present

## 2019-06-16 DIAGNOSIS — M2450 Contracture, unspecified joint: Secondary | ICD-10-CM | POA: Diagnosis not present

## 2019-06-16 DIAGNOSIS — Z9181 History of falling: Secondary | ICD-10-CM | POA: Diagnosis not present

## 2019-06-16 DIAGNOSIS — M79661 Pain in right lower leg: Secondary | ICD-10-CM | POA: Diagnosis not present

## 2019-06-16 DIAGNOSIS — M24571 Contracture, right ankle: Secondary | ICD-10-CM | POA: Diagnosis not present

## 2019-06-16 DIAGNOSIS — R278 Other lack of coordination: Secondary | ICD-10-CM | POA: Diagnosis not present

## 2019-06-16 DIAGNOSIS — J168 Pneumonia due to other specified infectious organisms: Secondary | ICD-10-CM | POA: Diagnosis not present

## 2019-06-16 DIAGNOSIS — M6281 Muscle weakness (generalized): Secondary | ICD-10-CM | POA: Diagnosis not present

## 2019-06-16 DIAGNOSIS — Z7409 Other reduced mobility: Secondary | ICD-10-CM | POA: Diagnosis not present

## 2019-06-17 DIAGNOSIS — R2689 Other abnormalities of gait and mobility: Secondary | ICD-10-CM | POA: Diagnosis not present

## 2019-06-17 DIAGNOSIS — J9690 Respiratory failure, unspecified, unspecified whether with hypoxia or hypercapnia: Secondary | ICD-10-CM | POA: Diagnosis not present

## 2019-06-17 DIAGNOSIS — R1319 Other dysphagia: Secondary | ICD-10-CM | POA: Diagnosis not present

## 2019-06-17 DIAGNOSIS — M79661 Pain in right lower leg: Secondary | ICD-10-CM | POA: Diagnosis not present

## 2019-06-17 DIAGNOSIS — R1312 Dysphagia, oropharyngeal phase: Secondary | ICD-10-CM | POA: Diagnosis not present

## 2019-06-17 DIAGNOSIS — M2450 Contracture, unspecified joint: Secondary | ICD-10-CM | POA: Diagnosis not present

## 2019-06-17 DIAGNOSIS — J168 Pneumonia due to other specified infectious organisms: Secondary | ICD-10-CM | POA: Diagnosis not present

## 2019-06-17 DIAGNOSIS — I10 Essential (primary) hypertension: Secondary | ICD-10-CM | POA: Diagnosis not present

## 2019-06-17 DIAGNOSIS — M79662 Pain in left lower leg: Secondary | ICD-10-CM | POA: Diagnosis not present

## 2019-06-17 DIAGNOSIS — M24561 Contracture, right knee: Secondary | ICD-10-CM | POA: Diagnosis not present

## 2019-06-17 DIAGNOSIS — Z9181 History of falling: Secondary | ICD-10-CM | POA: Diagnosis not present

## 2019-06-17 DIAGNOSIS — M24571 Contracture, right ankle: Secondary | ICD-10-CM | POA: Diagnosis not present

## 2019-06-17 DIAGNOSIS — M6281 Muscle weakness (generalized): Secondary | ICD-10-CM | POA: Diagnosis not present

## 2019-06-17 DIAGNOSIS — J984 Other disorders of lung: Secondary | ICD-10-CM | POA: Diagnosis not present

## 2019-06-17 DIAGNOSIS — R293 Abnormal posture: Secondary | ICD-10-CM | POA: Diagnosis not present

## 2019-06-17 DIAGNOSIS — J9601 Acute respiratory failure with hypoxia: Secondary | ICD-10-CM | POA: Diagnosis not present

## 2019-06-17 DIAGNOSIS — R2681 Unsteadiness on feet: Secondary | ICD-10-CM | POA: Diagnosis not present

## 2019-06-17 DIAGNOSIS — G9341 Metabolic encephalopathy: Secondary | ICD-10-CM | POA: Diagnosis not present

## 2019-06-17 DIAGNOSIS — J690 Pneumonitis due to inhalation of food and vomit: Secondary | ICD-10-CM | POA: Diagnosis not present

## 2019-06-17 DIAGNOSIS — R278 Other lack of coordination: Secondary | ICD-10-CM | POA: Diagnosis not present

## 2019-06-17 DIAGNOSIS — Z7409 Other reduced mobility: Secondary | ICD-10-CM | POA: Diagnosis not present

## 2019-06-18 DIAGNOSIS — J9601 Acute respiratory failure with hypoxia: Secondary | ICD-10-CM | POA: Diagnosis not present

## 2019-06-18 DIAGNOSIS — M6281 Muscle weakness (generalized): Secondary | ICD-10-CM | POA: Diagnosis not present

## 2019-06-18 DIAGNOSIS — R2689 Other abnormalities of gait and mobility: Secondary | ICD-10-CM | POA: Diagnosis not present

## 2019-06-18 DIAGNOSIS — M79662 Pain in left lower leg: Secondary | ICD-10-CM | POA: Diagnosis not present

## 2019-06-18 DIAGNOSIS — R1319 Other dysphagia: Secondary | ICD-10-CM | POA: Diagnosis not present

## 2019-06-18 DIAGNOSIS — R293 Abnormal posture: Secondary | ICD-10-CM | POA: Diagnosis not present

## 2019-06-18 DIAGNOSIS — M2450 Contracture, unspecified joint: Secondary | ICD-10-CM | POA: Diagnosis not present

## 2019-06-18 DIAGNOSIS — J9690 Respiratory failure, unspecified, unspecified whether with hypoxia or hypercapnia: Secondary | ICD-10-CM | POA: Diagnosis not present

## 2019-06-18 DIAGNOSIS — R278 Other lack of coordination: Secondary | ICD-10-CM | POA: Diagnosis not present

## 2019-06-18 DIAGNOSIS — M79661 Pain in right lower leg: Secondary | ICD-10-CM | POA: Diagnosis not present

## 2019-06-18 DIAGNOSIS — R1312 Dysphagia, oropharyngeal phase: Secondary | ICD-10-CM | POA: Diagnosis not present

## 2019-06-18 DIAGNOSIS — Z7409 Other reduced mobility: Secondary | ICD-10-CM | POA: Diagnosis not present

## 2019-06-18 DIAGNOSIS — M24571 Contracture, right ankle: Secondary | ICD-10-CM | POA: Diagnosis not present

## 2019-06-18 DIAGNOSIS — M24561 Contracture, right knee: Secondary | ICD-10-CM | POA: Diagnosis not present

## 2019-06-18 DIAGNOSIS — J168 Pneumonia due to other specified infectious organisms: Secondary | ICD-10-CM | POA: Diagnosis not present

## 2019-06-18 DIAGNOSIS — G9341 Metabolic encephalopathy: Secondary | ICD-10-CM | POA: Diagnosis not present

## 2019-06-18 DIAGNOSIS — R2681 Unsteadiness on feet: Secondary | ICD-10-CM | POA: Diagnosis not present

## 2019-06-18 DIAGNOSIS — Z9181 History of falling: Secondary | ICD-10-CM | POA: Diagnosis not present

## 2019-06-19 ENCOUNTER — Other Ambulatory Visit: Payer: Self-pay

## 2019-06-19 ENCOUNTER — Emergency Department (HOSPITAL_COMMUNITY): Payer: Medicare Other

## 2019-06-19 ENCOUNTER — Inpatient Hospital Stay (HOSPITAL_COMMUNITY)
Admission: EM | Admit: 2019-06-19 | Discharge: 2019-06-24 | DRG: 871 | Disposition: A | Discharge status: Skilled Nursing Facility | Payer: Medicare Other | Source: Skilled Nursing Facility | Attending: Internal Medicine | Admitting: Internal Medicine

## 2019-06-19 ENCOUNTER — Encounter (HOSPITAL_COMMUNITY): Payer: Self-pay | Admitting: *Deleted

## 2019-06-19 DIAGNOSIS — R64 Cachexia: Secondary | ICD-10-CM | POA: Diagnosis not present

## 2019-06-19 DIAGNOSIS — F0390 Unspecified dementia without behavioral disturbance: Secondary | ICD-10-CM | POA: Diagnosis present

## 2019-06-19 DIAGNOSIS — Z515 Encounter for palliative care: Secondary | ICD-10-CM | POA: Diagnosis not present

## 2019-06-19 DIAGNOSIS — J168 Pneumonia due to other specified infectious organisms: Secondary | ICD-10-CM | POA: Diagnosis not present

## 2019-06-19 DIAGNOSIS — B9620 Unspecified Escherichia coli [E. coli] as the cause of diseases classified elsewhere: Secondary | ICD-10-CM | POA: Diagnosis present

## 2019-06-19 DIAGNOSIS — E876 Hypokalemia: Secondary | ICD-10-CM | POA: Diagnosis not present

## 2019-06-19 DIAGNOSIS — I469 Cardiac arrest, cause unspecified: Secondary | ICD-10-CM | POA: Diagnosis not present

## 2019-06-19 DIAGNOSIS — Z7401 Bed confinement status: Secondary | ICD-10-CM

## 2019-06-19 DIAGNOSIS — Z7189 Other specified counseling: Secondary | ICD-10-CM

## 2019-06-19 DIAGNOSIS — G40409 Other generalized epilepsy and epileptic syndromes, not intractable, without status epilepticus: Secondary | ICD-10-CM | POA: Diagnosis present

## 2019-06-19 DIAGNOSIS — Z20822 Contact with and (suspected) exposure to covid-19: Secondary | ICD-10-CM | POA: Diagnosis not present

## 2019-06-19 DIAGNOSIS — Z66 Do not resuscitate: Secondary | ICD-10-CM | POA: Diagnosis not present

## 2019-06-19 DIAGNOSIS — M2450 Contracture, unspecified joint: Secondary | ICD-10-CM | POA: Diagnosis not present

## 2019-06-19 DIAGNOSIS — Z8673 Personal history of transient ischemic attack (TIA), and cerebral infarction without residual deficits: Secondary | ICD-10-CM

## 2019-06-19 DIAGNOSIS — M24561 Contracture, right knee: Secondary | ICD-10-CM | POA: Diagnosis not present

## 2019-06-19 DIAGNOSIS — G40919 Epilepsy, unspecified, intractable, without status epilepticus: Secondary | ICD-10-CM

## 2019-06-19 DIAGNOSIS — R569 Unspecified convulsions: Secondary | ICD-10-CM

## 2019-06-19 DIAGNOSIS — R2681 Unsteadiness on feet: Secondary | ICD-10-CM | POA: Diagnosis not present

## 2019-06-19 DIAGNOSIS — Z79899 Other long term (current) drug therapy: Secondary | ICD-10-CM

## 2019-06-19 DIAGNOSIS — Z87891 Personal history of nicotine dependence: Secondary | ICD-10-CM

## 2019-06-19 DIAGNOSIS — J9601 Acute respiratory failure with hypoxia: Secondary | ICD-10-CM | POA: Diagnosis not present

## 2019-06-19 DIAGNOSIS — G9341 Metabolic encephalopathy: Secondary | ICD-10-CM | POA: Diagnosis not present

## 2019-06-19 DIAGNOSIS — Z743 Need for continuous supervision: Secondary | ICD-10-CM | POA: Diagnosis not present

## 2019-06-19 DIAGNOSIS — N179 Acute kidney failure, unspecified: Secondary | ICD-10-CM | POA: Diagnosis not present

## 2019-06-19 DIAGNOSIS — R54 Age-related physical debility: Secondary | ICD-10-CM | POA: Diagnosis present

## 2019-06-19 DIAGNOSIS — Z7409 Other reduced mobility: Secondary | ICD-10-CM | POA: Diagnosis not present

## 2019-06-19 DIAGNOSIS — Z8249 Family history of ischemic heart disease and other diseases of the circulatory system: Secondary | ICD-10-CM

## 2019-06-19 DIAGNOSIS — M2550 Pain in unspecified joint: Secondary | ICD-10-CM | POA: Diagnosis not present

## 2019-06-19 DIAGNOSIS — Z833 Family history of diabetes mellitus: Secondary | ICD-10-CM

## 2019-06-19 DIAGNOSIS — Z1612 Extended spectrum beta lactamase (ESBL) resistance: Secondary | ICD-10-CM | POA: Diagnosis not present

## 2019-06-19 DIAGNOSIS — N390 Urinary tract infection, site not specified: Secondary | ICD-10-CM | POA: Diagnosis not present

## 2019-06-19 DIAGNOSIS — R000 Tachycardia, unspecified: Secondary | ICD-10-CM | POA: Diagnosis not present

## 2019-06-19 DIAGNOSIS — Z681 Body mass index (BMI) 19 or less, adult: Secondary | ICD-10-CM

## 2019-06-19 DIAGNOSIS — M79662 Pain in left lower leg: Secondary | ICD-10-CM | POA: Diagnosis not present

## 2019-06-19 DIAGNOSIS — R531 Weakness: Secondary | ICD-10-CM | POA: Diagnosis not present

## 2019-06-19 DIAGNOSIS — J9690 Respiratory failure, unspecified, unspecified whether with hypoxia or hypercapnia: Secondary | ICD-10-CM | POA: Diagnosis not present

## 2019-06-19 DIAGNOSIS — R519 Headache, unspecified: Secondary | ICD-10-CM | POA: Diagnosis not present

## 2019-06-19 DIAGNOSIS — I10 Essential (primary) hypertension: Secondary | ICD-10-CM | POA: Diagnosis present

## 2019-06-19 DIAGNOSIS — Z9114 Patient's other noncompliance with medication regimen: Secondary | ICD-10-CM | POA: Diagnosis not present

## 2019-06-19 DIAGNOSIS — M24571 Contracture, right ankle: Secondary | ICD-10-CM | POA: Diagnosis not present

## 2019-06-19 DIAGNOSIS — R1310 Dysphagia, unspecified: Secondary | ICD-10-CM | POA: Diagnosis present

## 2019-06-19 DIAGNOSIS — G9340 Encephalopathy, unspecified: Secondary | ICD-10-CM | POA: Diagnosis not present

## 2019-06-19 DIAGNOSIS — Z9181 History of falling: Secondary | ICD-10-CM | POA: Diagnosis not present

## 2019-06-19 DIAGNOSIS — M6281 Muscle weakness (generalized): Secondary | ICD-10-CM | POA: Diagnosis not present

## 2019-06-19 DIAGNOSIS — J690 Pneumonitis due to inhalation of food and vomit: Secondary | ICD-10-CM | POA: Diagnosis not present

## 2019-06-19 DIAGNOSIS — E872 Acidosis, unspecified: Secondary | ICD-10-CM | POA: Diagnosis present

## 2019-06-19 DIAGNOSIS — Z823 Family history of stroke: Secondary | ICD-10-CM | POA: Diagnosis not present

## 2019-06-19 DIAGNOSIS — L89152 Pressure ulcer of sacral region, stage 2: Secondary | ICD-10-CM | POA: Diagnosis present

## 2019-06-19 DIAGNOSIS — I160 Hypertensive urgency: Secondary | ICD-10-CM | POA: Diagnosis not present

## 2019-06-19 DIAGNOSIS — M79661 Pain in right lower leg: Secondary | ICD-10-CM | POA: Diagnosis not present

## 2019-06-19 DIAGNOSIS — A419 Sepsis, unspecified organism: Secondary | ICD-10-CM | POA: Diagnosis not present

## 2019-06-19 DIAGNOSIS — R1319 Other dysphagia: Secondary | ICD-10-CM | POA: Diagnosis not present

## 2019-06-19 DIAGNOSIS — R4020 Unspecified coma: Secondary | ICD-10-CM | POA: Diagnosis not present

## 2019-06-19 DIAGNOSIS — Z7982 Long term (current) use of aspirin: Secondary | ICD-10-CM

## 2019-06-19 DIAGNOSIS — J449 Chronic obstructive pulmonary disease, unspecified: Secondary | ICD-10-CM | POA: Diagnosis not present

## 2019-06-19 DIAGNOSIS — R278 Other lack of coordination: Secondary | ICD-10-CM | POA: Diagnosis not present

## 2019-06-19 DIAGNOSIS — R404 Transient alteration of awareness: Secondary | ICD-10-CM | POA: Diagnosis not present

## 2019-06-19 DIAGNOSIS — R293 Abnormal posture: Secondary | ICD-10-CM | POA: Diagnosis not present

## 2019-06-19 DIAGNOSIS — R1312 Dysphagia, oropharyngeal phase: Secondary | ICD-10-CM | POA: Diagnosis not present

## 2019-06-19 DIAGNOSIS — R0902 Hypoxemia: Secondary | ICD-10-CM | POA: Diagnosis not present

## 2019-06-19 DIAGNOSIS — R2689 Other abnormalities of gait and mobility: Secondary | ICD-10-CM | POA: Diagnosis not present

## 2019-06-19 LAB — LACTIC ACID, PLASMA
Lactic Acid, Venous: >11 mmol/L (ref 0.5–1.9)
Lactic Acid, Venous: 7.4 mmol/L (ref 0.5–1.9)
Lactic Acid, Venous: 2.5 mmol/L (ref 0.5–1.9)
Lactic Acid, Venous: 2.3 mmol/L (ref 0.5–1.9)

## 2019-06-19 LAB — URINALYSIS, ROUTINE W REFLEX MICROSCOPIC
Bilirubin Urine: NEGATIVE
Glucose, UA: NEGATIVE mg/dL
Ketones, ur: NEGATIVE mg/dL
Nitrite: NEGATIVE
Protein, ur: >300 mg/dL — AB
Specific Gravity, Urine: 1.025 (ref 1.005–1.030)
pH: 7 (ref 5.0–8.0)

## 2019-06-19 LAB — CBC WITH DIFFERENTIAL/PLATELET
Abs Immature Granulocytes: 0.17 10*3/uL — ABNORMAL HIGH (ref 0.00–0.07)
Basophils Absolute: 0.1 10*3/uL (ref 0.0–0.1)
Basophils Relative: 0 %
Eosinophils Absolute: 0 10*3/uL (ref 0.0–0.5)
Eosinophils Relative: 0 %
HCT: 49 % (ref 39.0–52.0)
Hemoglobin: 15.9 g/dL (ref 13.0–17.0)
Immature Granulocytes: 1 %
Lymphocytes Relative: 4 %
Lymphs Abs: 0.9 10*3/uL (ref 0.7–4.0)
MCH: 32.6 pg (ref 26.0–34.0)
MCHC: 32.4 g/dL (ref 30.0–36.0)
MCV: 100.6 fL — ABNORMAL HIGH (ref 80.0–100.0)
Monocytes Absolute: 1.4 10*3/uL — ABNORMAL HIGH (ref 0.1–1.0)
Monocytes Relative: 6 %
Neutro Abs: 22.4 10*3/uL — ABNORMAL HIGH (ref 1.7–7.7)
Neutrophils Relative %: 89 %
Platelets: 378 10*3/uL (ref 150–400)
RBC: 4.87 MIL/uL (ref 4.22–5.81)
RDW: 14.7 % (ref 11.5–15.5)
WBC: 25 10*3/uL — ABNORMAL HIGH (ref 4.0–10.5)
nRBC: 0 % (ref 0.0–0.2)

## 2019-06-19 LAB — HEPATIC FUNCTION PANEL
ALT: 26 U/L (ref 0–44)
AST: 39 U/L (ref 15–41)
Albumin: 3.5 g/dL (ref 3.5–5.0)
Alkaline Phosphatase: 101 U/L (ref 38–126)
Bilirubin, Direct: <0.1 mg/dL (ref 0.0–0.2)
Total Bilirubin: 0.5 mg/dL (ref 0.3–1.2)
Total Protein: 7.9 g/dL (ref 6.5–8.1)

## 2019-06-19 LAB — BASIC METABOLIC PANEL
Anion gap: 24 — ABNORMAL HIGH (ref 5–15)
BUN: 12 mg/dL (ref 8–23)
CO2: 14 mmol/L — ABNORMAL LOW (ref 22–32)
Calcium: 9.6 mg/dL (ref 8.9–10.3)
Chloride: 103 mmol/L (ref 98–111)
Creatinine, Ser: 1.22 mg/dL (ref 0.61–1.24)
GFR calc Af Amer: >60 mL/min (ref >60)
GFR calc non Af Amer: 60 mL/min — ABNORMAL LOW (ref >60)
Glucose, Bld: 151 mg/dL — ABNORMAL HIGH (ref 70–99)
Potassium: 3.1 mmol/L — ABNORMAL LOW (ref 3.5–5.1)
Sodium: 141 mmol/L (ref 135–145)

## 2019-06-19 LAB — URINALYSIS, MICROSCOPIC (REFLEX): WBC, UA: >50 WBC/hpf (ref 0–5)

## 2019-06-19 LAB — PROCALCITONIN: Procalcitonin: 4.17 ng/mL

## 2019-06-19 LAB — SARS CORONAVIRUS 2 BY RT PCR (HOSPITAL ORDER, PERFORMED IN ~~LOC~~ HOSPITAL LAB): SARS Coronavirus 2: NEGATIVE

## 2019-06-19 LAB — VALPROIC ACID LEVEL: Valproic Acid Lvl: <10 ug/mL — ABNORMAL LOW (ref 50.0–100.0)

## 2019-06-19 LAB — CARBAMAZEPINE LEVEL, TOTAL: Carbamazepine Lvl: <2 ug/mL — ABNORMAL LOW (ref 4.0–12.0)

## 2019-06-19 MED ORDER — POLYVINYL ALCOHOL 1.4 % OP SOLN
2.0000 [drp] | Freq: Two times a day (BID) | OPHTHALMIC | Status: DC
  Administered 2019-06-20 – 2019-06-24 (×9): 2 [drp] via OPHTHALMIC
  Filled 2019-06-19: qty 15

## 2019-06-19 MED ORDER — SODIUM CHLORIDE 0.9 % IV BOLUS
500.0000 mL | Freq: Once | INTRAVENOUS | Status: AC
  Administered 2019-06-19: 500 mL via INTRAVENOUS

## 2019-06-19 MED ORDER — LEVETIRACETAM IN NACL 1500 MG/100ML IV SOLN
1500.0000 mg | Freq: Two times a day (BID) | INTRAVENOUS | Status: DC
  Administered 2019-06-19 – 2019-06-21 (×5): 1500 mg via INTRAVENOUS
  Filled 2019-06-19 (×6): qty 100

## 2019-06-19 MED ORDER — VANCOMYCIN HCL IN DEXTROSE 1-5 GM/200ML-% IV SOLN
1000.0000 mg | Freq: Once | INTRAVENOUS | Status: AC
  Administered 2019-06-19: 1000 mg via INTRAVENOUS
  Filled 2019-06-19: qty 200

## 2019-06-19 MED ORDER — HYDRALAZINE HCL 20 MG/ML IJ SOLN
10.0000 mg | INTRAMUSCULAR | Status: DC | PRN
  Administered 2019-06-22 – 2019-06-24 (×4): 10 mg via INTRAVENOUS
  Filled 2019-06-19 (×5): qty 1

## 2019-06-19 MED ORDER — HYPROMELLOSE (GONIOSCOPIC) 2.5 % OP SOLN: 2.0000 [drp] | Freq: Two times a day (BID) | OPHTHALMIC | Status: DC

## 2019-06-19 MED ORDER — HYDRALAZINE HCL 50 MG PO TABS
50.0000 mg | ORAL_TABLET | Freq: Three times a day (TID) | ORAL | Status: DC
  Administered 2019-06-19 – 2019-06-24 (×14): 50 mg via ORAL
  Filled 2019-06-19 (×14): qty 1

## 2019-06-19 MED ORDER — SODIUM CHLORIDE 0.9 % IV SOLN
2.0000 g | Freq: Three times a day (TID) | INTRAVENOUS | Status: DC
  Administered 2019-06-20: 2 g via INTRAVENOUS
  Filled 2019-06-19 (×2): qty 2

## 2019-06-19 MED ORDER — SACCHAROMYCES BOULARDII 250 MG PO CAPS
250.0000 mg | ORAL_CAPSULE | Freq: Every day | ORAL | Status: DC
  Administered 2019-06-19 – 2019-06-24 (×6): 250 mg via ORAL
  Filled 2019-06-19 (×6): qty 1

## 2019-06-19 MED ORDER — SODIUM CHLORIDE 0.9 % IV BOLUS
1000.0000 mL | Freq: Once | INTRAVENOUS | Status: AC
  Administered 2019-06-19: 1000 mL via INTRAVENOUS

## 2019-06-19 MED ORDER — ISOSORBIDE MONONITRATE ER 30 MG PO TB24
30.0000 mg | ORAL_TABLET | Freq: Every day | ORAL | Status: DC
  Administered 2019-06-19 – 2019-06-23 (×5): 30 mg via ORAL
  Filled 2019-06-19 (×5): qty 1

## 2019-06-19 MED ORDER — SODIUM CHLORIDE 0.9 % IV SOLN
300.0000 mg | Freq: Two times a day (BID) | INTRAVENOUS | Status: DC
  Administered 2019-06-19 – 2019-06-21 (×5): 300 mg via INTRAVENOUS
  Filled 2019-06-19 (×8): qty 30

## 2019-06-19 MED ORDER — SODIUM CHLORIDE 0.9 % IV SOLN: INTRAVENOUS | Status: DC

## 2019-06-19 MED ORDER — ONDANSETRON HCL 4 MG/2ML IJ SOLN
4.0000 mg | Freq: Once | INTRAMUSCULAR | Status: AC
  Administered 2019-06-19: 4 mg via INTRAVENOUS
  Filled 2019-06-19: qty 2

## 2019-06-19 MED ORDER — ONDANSETRON HCL 4 MG/2ML IJ SOLN
4.0000 mg | Freq: Four times a day (QID) | INTRAMUSCULAR | Status: DC | PRN
  Administered 2019-06-19: 4 mg via INTRAVENOUS
  Filled 2019-06-19: qty 2

## 2019-06-19 MED ORDER — ACETAMINOPHEN 325 MG PO TABS
650.0000 mg | ORAL_TABLET | Freq: Four times a day (QID) | ORAL | Status: DC | PRN
  Administered 2019-06-19 – 2019-06-21 (×3): 650 mg via ORAL
  Filled 2019-06-19 (×5): qty 2

## 2019-06-19 MED ORDER — AMLODIPINE BESYLATE 10 MG PO TABS
10.0000 mg | ORAL_TABLET | Freq: Every day | ORAL | Status: DC
  Administered 2019-06-19 – 2019-06-24 (×6): 10 mg via ORAL
  Filled 2019-06-19 (×6): qty 1

## 2019-06-19 MED ORDER — ENOXAPARIN SODIUM 40 MG/0.4ML ~~LOC~~ SOLN
40.0000 mg | SUBCUTANEOUS | Status: DC
  Administered 2019-06-19 – 2019-06-23 (×3): 40 mg via SUBCUTANEOUS
  Filled 2019-06-19 (×4): qty 0.4

## 2019-06-19 MED ORDER — ONDANSETRON HCL 4 MG PO TABS
4.0000 mg | ORAL_TABLET | Freq: Four times a day (QID) | ORAL | Status: DC | PRN
  Administered 2019-06-21: 4 mg via ORAL
  Filled 2019-06-19: qty 1

## 2019-06-19 MED ORDER — VANCOMYCIN HCL 500 MG/100ML IV SOLN
500.0000 mg | Freq: Two times a day (BID) | INTRAVENOUS | Status: DC
  Administered 2019-06-20: 500 mg via INTRAVENOUS
  Filled 2019-06-19 (×2): qty 100

## 2019-06-19 MED ORDER — VALPROIC ACID 250 MG/5ML PO SOLN
750.0000 mg | Freq: Three times a day (TID) | ORAL | Status: DC
  Administered 2019-06-19 – 2019-06-24 (×15): 750 mg via ORAL
  Filled 2019-06-19 (×18): qty 15

## 2019-06-19 MED ORDER — MAGNESIUM OXIDE 400 (241.3 MG) MG PO TABS
400.0000 mg | ORAL_TABLET | Freq: Every day | ORAL | Status: DC
  Administered 2019-06-19 – 2019-06-24 (×6): 400 mg via ORAL
  Filled 2019-06-19 (×7): qty 1

## 2019-06-19 MED ORDER — LEVETIRACETAM IN NACL 1500 MG/100ML IV SOLN
1500.0000 mg | Freq: Once | INTRAVENOUS | Status: AC
  Administered 2019-06-19: 1500 mg via INTRAVENOUS
  Filled 2019-06-19: qty 100

## 2019-06-19 MED ORDER — CARBAMAZEPINE 100 MG/5ML PO SUSP
300.0000 mg | Freq: Two times a day (BID) | ORAL | Status: DC
  Administered 2019-06-19 – 2019-06-21 (×5): 300 mg
  Filled 2019-06-19 (×6): qty 15

## 2019-06-19 MED ORDER — ALBUTEROL SULFATE (2.5 MG/3ML) 0.083% IN NEBU: 2.5000 mg | INHALATION_SOLUTION | Freq: Four times a day (QID) | RESPIRATORY_TRACT | Status: DC | PRN

## 2019-06-19 MED ORDER — CLONAZEPAM 0.25 MG PO TBDP
0.2500 mg | ORAL_TABLET | Freq: Two times a day (BID) | ORAL | Status: DC
  Administered 2019-06-19 – 2019-06-23 (×10): 0.25 mg via ORAL
  Filled 2019-06-19 (×10): qty 1

## 2019-06-19 MED ORDER — HYDRALAZINE HCL 25 MG PO TABS: 25.0000 mg | ORAL_TABLET | Freq: Every day | ORAL | Status: DC | PRN

## 2019-06-19 MED ORDER — ACETAMINOPHEN 650 MG RE SUPP
650.0000 mg | Freq: Once | RECTAL | Status: AC
  Administered 2019-06-19: 650 mg via RECTAL
  Filled 2019-06-19: qty 1

## 2019-06-19 MED ORDER — SODIUM CHLORIDE 0.9 % IV SOLN
2.0000 g | Freq: Once | INTRAVENOUS | Status: AC
  Administered 2019-06-19: 2 g via INTRAVENOUS
  Filled 2019-06-19: qty 2
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
## 2019-06-19 NOTE — ED Notes (Signed)
Dr Katrinka Blazing at bedside.  Is aware of pt bp.

## 2019-06-19 NOTE — Progress Notes (Signed)
Pharmacy Antibiotic Note  Lee [name] is a 69 y.o. male admitted on 06/19/2019 with sepsis.  Pharmacy has been consulted for vancomycin and cefepime dosing.  Patient presenting from SNF with a seizure. Concern for pneumonia with CXR showing density in right lobe. Creatinine today elevated at 1.22, baseline <1. WBC count elevated at 25.   Plan: Vancomycin 500 mg IV every 12 hours.  Goal trough 15-20 mcg/mL. Cefepime 2 grams IV every 8 hours Monitor clinical status, renal function, cultures, LOT, de-escalation   Height: 5\' 10"  (177.8 cm) Weight: 61.4 kg (135 lb 5.8 oz) IBW/kg (Calculated) : 73  Temp (24hrs), Avg:100.7 F (38.2 C), Min:100.7 F (38.2 C), Max:100.7 F (38.2 C)  Recent Labs  Lab 06/19/19 1254 06/19/19 1408 06/19/19 1430  WBC 25.0*  --   --   CREATININE  --   --  1.22  LATICACIDVEN >11.0* 7.4*  --     Estimated Creatinine Clearance: 48.9 mL/min (by C-G formula based on SCr of 1.22 mg/dL).    Allergies  Allergen Reactions  . Orange Juice [Orange Oil] Diarrhea  . Vicodin [Hydrocodone-Acetaminophen] Other (See Comments)    "triggered seizure both here and at home when he tried to take it" (01/22/2012)  . Penicillins Nausea And Vomiting    Tolerates Zosyn and Unasyn. Has patient had a PCN reaction causing immediate rash, facial/tongue/throat swelling, SOB or lightheadedness with hypotension: no Has patient had a PCN reaction causing severe rash involving mucus membranes or skin necrosis: no Has patient had a PCN reaction that required hospitalization: no Has patient had a PCN reaction occurring within the last 10 years: no If all of the above answers are "NO", then may proceed with Cephalosporin u  . Oxycodone     Causes Seizures  . Vicodin [Hydrocodone-Acetaminophen]     Antimicrobials this admission: cefepime 6/11 >>  vancomycin 6/11 >>   Dose adjustments this admission: N/A  Microbiology results: 6/11 BCx: sent 6/11 UCx: (cath) sent   Thank  you,   8/11, PharmD PGY-1 Pharmacy Resident   Please check amion for clinical pharmacist contact number 06/19/2019 4:32 PM

## 2019-06-19 NOTE — Progress Notes (Signed)
LTM started; event button tested; nurse and wife educated on event button; new leads used. Patient in ED so not being monitored.

## 2019-06-19 NOTE — ED Notes (Signed)
Requested tylenol for fever.

## 2019-06-19 NOTE — Consult Note (Addendum)
Neurology Consultation Reason for Consult: Concern for status epilepticus Referring Physician: Dr. Virgina Norfolk  CC: Seizure  History is obtained from: Chart review as patient unable to provide history, no family at bedside.  HPI: Lee [name] is a 69 y.o. male with past medical history of cerebrovascular accident, advanced dementia, epilepsy and hypertension who was brought in by EMS from [facility] for seizures.  Per review of ED notes, patient was noted to have seizure-like episodes while at [facility] and was given 2 mg IV Ativan with no improvement.  After EMS arrived patient received another 5 mg of Versed after which the seizures stopped.  On arrival to ED, patient's blood pressure was 130/80, heart rate 126, CBG 209, respiratory rate 28.  Patient was evaluated by ED physician and neurology was consulted for further management due to suspicion for subclinical seizures/status epilepticus.  Of note, ED physician contacted patient's wife to confirm patient's goals of care and wife confirmed DNR/DNI.  Current AEDs: Carbamazepine 300 mg twice daily, Keppra 1500 mg twice daily, Depakote 750 mg every 8 hours, Vimpat 300 mg twice daily and Klonopin 0.25 mg twice daily  ROS: Unable to obtain due to altered mental status.   Past Medical History:  Diagnosis Date  . Aneurysm of femoral artery (HCC)   . Arthritis    "anwhere I've been hurt before" (01/22/2012)  . COPD (chronic obstructive pulmonary disease) (HCC)   . Dementia (HCC)   . ETOH abuse   . Gout   . Grand mal epilepsy, controlled (HCC) 1980's   last on 10/10/14  . Hypertension   . Kidney stone   . Peripheral vascular disease (HCC)   . Seizures (HCC)    last grand mal seizures Aug 2018  . Stroke [facility]) Sept. 2012   denies residual (01/22/2012)    Family History  Problem Relation Age of Onset  . Diabetes Mother   . Aneurysm Mother   . Hypertension Mother   . Stroke Father   . Heart disease Father   . Seizures Other         Nephew  . Brain cancer Other        Nephew  . Colon cancer Maternal Aunt   . Colon cancer Maternal Uncle     Social History: Per chart review he quit smoking about 5 years ago. His smoking use included cigarettes. He has a 42.00 pack-year smoking history. He has never used smokeless tobacco. he does not drink alcohol and does not use drugs.  Exam: Current vital signs: BP (!) 142/86   Pulse (!) 122   Temp (!) 100.7 F (38.2 C) (Rectal)   Resp (!) 32   Ht 5\' 10"  (1.778 m)   Wt 61.4 kg   SpO2 90%   BMI 19.42 kg/m  Vital signs in last 24 hours: Temp:  [100.7 F (38.2 C)] 100.7 F (38.2 C) (06/11 1245) Pulse Rate:  [122] 122 (06/11 1245) Resp:  [32] 32 (06/11 1245) BP: (142)/(86) 142/86 (06/11 1245) SpO2:  [90 %-92 %] 90 % (06/11 1245) Weight:  [61.4 kg] 61.4 kg (06/11 1244)   Physical Exam  Constitutional: Cachectic looking, not in apparent distress Psych: Difficult to assess secondary to altered mental status eyes: No scleral injection HENT: No OP obstrucion Head: Normocephalic.  Cardiovascular: Normal rate and regular rhythm.  Respiratory: Effort normal, non-labored breathing GI: Soft.  No distension. There is no tenderness.  Skin: Warm Neuro: Lethargic, opens eyes to noxious stimuli and moans but does  not follow commands, bilateral eyes deviated upwards with Bell's phenomena, opens eyes to noxious stimuli with left gaze preference (not forced gaze deviation), pupils equal round and reactive to light, bilateral corneal reflex intact, difficult to assess gag reflex, increased tone in all 4 extremities part of which could be voluntary/related to encephalopathy, reflexes 2+ bilaterally symmetric  I have reviewed labs in epic and the results pertinent to this consultation are: Labs ordered and pending at this point  I have reviewed the images obtained: CT head without contrast ordered and pending  ASSESSMENT/PLAN: 69 year old male with history of epilepsy who  presented with breakthrough seizures.    Epilepsy with breakthrough seizures Acute encephalopathy, likely related to seizures, postictal state, medications History of stroke Advanced dementia -On exam, patient opens eyes to noxious stimuli but does not follow commands and has increased tone in all 4 extremities.  He does not have any focal lateralizing signs like forced gaze deviation, stiffening or jerking of unilateral extremities. -Chest x-ray concerning for density in the right lung base which might represent infection and be the etiology for breakthrough seizures.  Recommendations: -We will obtain stat EEG to look for subclinical seizures/status epilepticus -We will check drug levels for Vimpat and Depakote to help adjust doses of antiseizure medications if needed -We will check CBC, UA to look for etiology of breakthrough seizures. -As patient is DNR/DNI, will carefully use IV Ativan as needed for breakthrough seizure -We will resume home antiepileptics including Keppra 15 mg twice daily, Vimpat 300 mg twice daily, Depakote 750 every 8 hours. -We likely need will need NG/OG tube in the interim to resume carbamazepine 300 mg twice daily and Klonopin 0.25 mg twice daily -Continue seizure precautions, neuro checks every 4 hours -Management of rest of comorbidities per primary team  ADDENDUM -Review CT head without contrast, no acute intracranial abnormality - Reviewed stat EEG: Showed left hemispheric slowing most likely due to postictal state.  However patient not back to baseline therefore we will continue with long-term EEG monitoring. -Updated ED physician Dr.Curatolo  I have spent a total of 80  minutes with the patient reviewing hospital notes,  test results, labs and examining the patient as well as establishing an assessment and plan.  > 50% of time was spent in direct patient care.     Zeb Comfort Epilepsy [facility]

## 2019-06-19 NOTE — Procedures (Signed)
Patient Name: Lee [name]  MRN: [id number]  Epilepsy Attending: Charlsie Quest  Referring Physician/Provider: Dr. Lindie Spruce Date: 06/19/2019 Duration: 23.30 mins  Patient history: 69 year old male with history of epilepsy presented with breakthrough seizures, continues to be altered.  EEG to evaluate for seizures, subclinical status.  Level of alertness: Lethargic  AEDs during EEG study: Ativan, Keppra  Technical aspects: This EEG study was done with scalp electrodes positioned according to the 10-20 International system of electrode placement. Electrical activity was acquired at a sampling rate of 500Hz  and reviewed with a high frequency filter of 70Hz  and a low frequency filter of 1Hz . EEG data were recorded continuously and digitally stored.   Description: No clear posterior dominant rhythm was seen.  EEG showed continuous generalized and lateralized left hemisphere polymorphic 3 to 6 Hz theta-delta slowing.  Hyperventilation and photic stimulation were not performed.     Of note, EEG was technically difficult due to significant myogenic/shivering artifact.  ABNORMALITY -Continuous slow, generalized and lateralized left hemisphere  IMPRESSION: This technically difficult study is suggestive of cortical dysfunction in left hemisphere likely secondary to underlying structural abnormality, postictal state. No seizures or definite epileptiform discharges were seen throughout the recording.    Lajoy Vanamburg 

## 2019-06-19 NOTE — ED Notes (Signed)
Attempted report 
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
## 2019-06-19 NOTE — Consult Note (Signed)
Palliative Medicine Inpatient Consult Note  Reason for consult:  Discussion of comfort care  HPI:  Per intake H&P --> Lee [name] is a 69 y.o. male with medical history significant of CVA, seizure order, advanced dementia, COPD, aspiration pneumonia, and prior alcohol abuse who presented from [facility] for seizures.  He was given 2 mg of Ativan IV and subsequently 5 mg of Versed which resolved seizures.  Patient is bedbound at baseline and wife confirmed that he is a DNR/DNI to ED physician.  Last hospitalized from 5/6-5/9 treated for multifocal pneumonia thought to be secondary to aspiration.  Palliative care was consulted to continue goals of care conversations in the setting of recurrent seizures and aspiration pneumonias. Asked to discuss comfort care more specifically.  Clinical Assessment/Goals of Care: I have reviewed medical records including EPIC notes, labs and imaging, received report from bedside RN, assessed the patient. I met at bedside with [facility]. He was not coherent at that time and unable to answer questions.   I called Neila Gear (Spouse) to furtherdiscuss diagnosis prognosis, GOC, EOL wishes, disposition and options.  I introduced Palliative Medicine as specialized medical care for people living with serious illness. It focuses on providing relief from the symptoms and stress of a serious illness. The goal is to improve quality of life for both the patient and the family. [name] shares that she works in medical records and has some degree of familiarity with Palliative Care.  I asked [name] to tell me about [name]. She shares that [name] moved from [place] to [place] in his youth he has remained here since. [name] and [name] have been married for seventeen years, they have no children together. 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 In regards to things he enjoys, he  loves music and television, "Law and Order" specifically.   [name] shares that she was visiting [place] this past Wednesday at [facility]. She feels that he was doing well and able to snap his fingers to the music they were enjoying. He was noted to be sitting up and singing at this time. He was eating will without any signs that he was not well.  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   We reviewed [name] code status from his prior admission which remains the same. DNAR/DNI, limited interventions inclusive of IV abx and IVF, no tube feeding in the setting of advanced dementia. I shared with [name] that I think continuing these treatments is certainly within reason though if there are no improvements may within the next few days we should consider discussions of comfort versus hospice. She stated understanding of this with emphasis on not allowing prolongation of suffering.   We discussed how much Millan has been through over the years. She shares that he has been through "a lot" and was very tearful in discussion of the present situation. [name] expressed that she is overwhelmed as her sister is asking her to care for her brother who also is affect by progressed dementia. She expressed that so many people she knows are dying and it is exceptionally overwhelming. Utilized Office manager during our conversation with one another.   I asked [name] if we can meet tomorrow and she stated that she plans on coming in at 0930. We plan to meet at this time to further  discuss [name].   Discussed the importance of continued conversation with family and their  medical providers regarding overall plan of care and treatment options, ensuring decisions are within the context of the patients values and GOCs.  Decision Maker: [name] (Spouse) - [phone]  SUMMARY OF RECOMMENDATIONS     DNAR/DNI, limited interventions inclusive of IV abx and IVF, no tube feeding in the setting of advanced dementia  Treat the treatable  24-48 hours of current therapies to identify if improvements can be made if not or if patient has further clinical deterioration will discuss transition to comfort care. Given the rapidity of events, [name] is not prepared to transition to comfort oriented care at this time.   Plan to meet with [name] tomorrow at [facility] consult  Ongoing support by PMT  Code Status/Advance Care Planning: DNAR/DNI   Palliative Prophylaxis:   Oral care, Turn Q2H  Additional Recommendations (Limitations, Scope, Preferences):  Continue current level of care   Psycho-social/Spiritual:   Desire for further Chaplaincy support: Yes  Additional Recommendations: Education on progressive dementia   Prognosis: Poor in the setting of recurrent hospitalizations  Discharge Planning: Patient lives at [facility]  PPS: 10%   This conversation/these recommendations were discussed with patient primary care team, Dr. Tamala Julian  Time In: 1740 Time Out: 1850 Total Time: 32 Greater than 50%  of this time was spent counseling and coordinating care related to the above assessment and plan.  [place] Team Team Cell Phone: [phone] Please utilize secure chat with additional questions, if there is no response within 30 minutes please call the above phone number  Palliative Medicine Team providers are available by phone from 7am to 7pm daily and can be reached through the team cell phone.  Should this patient require assistance outside of these hours, please call the patient's attending physician.

## 2019-06-19 NOTE — ED Triage Notes (Signed)
Pt here via [facility] from [facility] for seizure x 1 hour.  Hx of seizures.  Staff gave pt 2 mg ativan with no improvement.  They stated to [facility] that sats were 65% on 4L.  [facility] gave 5 of versed for tonic/clonic seizure.  Seizing stopped after versed and sats increased to 99% on NRB.    130/80 bp 126 pulse 209 cbg 28 RR

## 2019-06-19 NOTE — Progress Notes (Signed)
STAT EEG completed; results pending. Dr Melynda Ripple notified. Pt going to Ct without leads and then being rehooked for LTM.

## 2019-06-20 ENCOUNTER — Other Ambulatory Visit: Payer: Self-pay

## 2019-06-20 DIAGNOSIS — G9340 Encephalopathy, unspecified: Secondary | ICD-10-CM

## 2019-06-20 DIAGNOSIS — J690 Pneumonitis due to inhalation of food and vomit: Secondary | ICD-10-CM

## 2019-06-20 DIAGNOSIS — A419 Sepsis, unspecified organism: Principal | ICD-10-CM

## 2019-06-20 LAB — BASIC METABOLIC PANEL
Anion gap: 8 (ref 5–15)
BUN: 7 mg/dL — ABNORMAL LOW (ref 8–23)
CO2: 26 mmol/L (ref 22–32)
Calcium: 8.6 mg/dL — ABNORMAL LOW (ref 8.9–10.3)
Chloride: 107 mmol/L (ref 98–111)
Creatinine, Ser: 0.73 mg/dL (ref 0.61–1.24)
GFR calc Af Amer: >60 mL/min (ref >60)
GFR calc non Af Amer: >60 mL/min (ref >60)
Glucose, Bld: 87 mg/dL (ref 70–99)
Potassium: 3.1 mmol/L — ABNORMAL LOW (ref 3.5–5.1)
Sodium: 141 mmol/L (ref 135–145)

## 2019-06-20 LAB — CBC
HCT: 38.4 % — ABNORMAL LOW (ref 39.0–52.0)
Hemoglobin: 13 g/dL (ref 13.0–17.0)
MCH: 32.8 pg (ref 26.0–34.0)
MCHC: 33.9 g/dL (ref 30.0–36.0)
MCV: 97 fL (ref 80.0–100.0)
Platelets: 285 10*3/uL (ref 150–400)
RBC: 3.96 MIL/uL — ABNORMAL LOW (ref 4.22–5.81)
RDW: 14.6 % (ref 11.5–15.5)
WBC: 14.2 10*3/uL — ABNORMAL HIGH (ref 4.0–10.5)
nRBC: 0 % (ref 0.0–0.2)

## 2019-06-20 LAB — BLOOD CULTURE ID PANEL (REFLEXED)

## 2019-06-20 LAB — LACTIC ACID, PLASMA
Lactic Acid, Venous: 2.5 mmol/L (ref 0.5–1.9)
Lactic Acid, Venous: 1.3 mmol/L (ref 0.5–1.9)

## 2019-06-20 LAB — MRSA PCR SCREENING: MRSA by PCR: NEGATIVE

## 2019-06-20 LAB — MAGNESIUM: Magnesium: 1.9 mg/dL (ref 1.7–2.4)

## 2019-06-20 MED ORDER — SODIUM CHLORIDE 0.9 % IV SOLN
3.0000 g | Freq: Three times a day (TID) | INTRAVENOUS | Status: DC
  Filled 2019-06-20 (×2): qty 8

## 2019-06-20 MED ORDER — POTASSIUM CHLORIDE 20 MEQ/15ML (10%) PO SOLN: 40.0000 meq | Freq: Two times a day (BID) | ORAL | Status: DC

## 2019-06-20 MED ORDER — SODIUM CHLORIDE 0.9 % IV SOLN
3.0000 g | Freq: Three times a day (TID) | INTRAVENOUS | Status: DC
  Administered 2019-06-20 – 2019-06-24 (×12): 3 g via INTRAVENOUS
  Filled 2019-06-20 (×2): qty 3
  Filled 2019-06-20 (×2): qty 8
  Filled 2019-06-20: qty 3
  Filled 2019-06-20 (×2): qty 8
  Filled 2019-06-20: qty 3
  Filled 2019-06-20: qty 8
  Filled 2019-06-20 (×4): qty 3
  Filled 2019-06-20: qty 8
  Filled 2019-06-20: qty 3

## 2019-06-20 MED ORDER — SODIUM CHLORIDE 0.9 % IV BOLUS
500.0000 mL | Freq: Once | INTRAVENOUS | Status: AC
  Administered 2019-06-20: 500 mL via INTRAVENOUS

## 2019-06-20 MED ORDER — SODIUM CHLORIDE 0.9% FLUSH: 10.0000 mL | INTRAVENOUS | Status: DC | PRN

## 2019-06-20 MED ORDER — VALPROATE SODIUM 500 MG/5ML IV SOLN
750.0000 mg | Freq: Once | INTRAVENOUS | Status: AC
  Administered 2019-06-21: 750 mg via INTRAVENOUS
  Filled 2019-06-20 (×2): qty 7.5

## 2019-06-20 MED ORDER — ORAL CARE MOUTH RINSE
15.0000 mL | Freq: Two times a day (BID) | OROMUCOSAL | Status: DC
  Administered 2019-06-20 (×2): 15 mL via OROMUCOSAL

## 2019-06-20 MED ORDER — SODIUM CHLORIDE 0.9 % IV SOLN: 750.0000 mg | Freq: Once | INTRAVENOUS | Status: DC

## 2019-06-20 MED ORDER — KETOROLAC TROMETHAMINE 15 MG/ML IJ SOLN
7.5000 mg | Freq: Three times a day (TID) | INTRAMUSCULAR | Status: AC | PRN
  Administered 2019-06-20: 7.5 mg via INTRAVENOUS
  Filled 2019-06-20: qty 1

## 2019-06-20 MED ORDER — CHLORHEXIDINE GLUCONATE 0.12 % MT SOLN
15.0000 mL | Freq: Two times a day (BID) | OROMUCOSAL | Status: DC
  Administered 2019-06-20 – 2019-06-24 (×6): 15 mL via OROMUCOSAL
  Filled 2019-06-20 (×6): qty 15

## 2019-06-20 MED ORDER — POTASSIUM CHLORIDE IN NACL 20-0.9 MEQ/L-% IV SOLN
INTRAVENOUS | Status: AC
  Filled 2019-06-20: qty 1000

## 2019-06-20 MED ORDER — SODIUM CHLORIDE 0.9 % IV SOLN: INTRAVENOUS | Status: DC

## 2019-06-20 MED ORDER — MAGNESIUM SULFATE IN D5W 1-5 GM/100ML-% IV SOLN
1.0000 g | Freq: Once | INTRAVENOUS | Status: AC
  Administered 2019-06-20: 1 g via INTRAVENOUS
  Filled 2019-06-20: qty 100

## 2019-06-20 MED ORDER — POTASSIUM CHLORIDE 20 MEQ PO PACK
40.0000 meq | PACK | Freq: Two times a day (BID) | ORAL | Status: AC
  Administered 2019-06-20 – 2019-06-21 (×4): 40 meq
  Filled 2019-06-20 (×4): qty 2

## 2019-06-20 MED ORDER — SODIUM CHLORIDE 0.9% FLUSH
10.0000 mL | Freq: Two times a day (BID) | INTRAVENOUS | Status: DC
  Administered 2019-06-21: 10 mL
  Administered 2019-06-21: 20 mL
  Administered 2019-06-21 – 2019-06-24 (×6): 10 mL

## 2019-06-20 NOTE — Progress Notes (Addendum)
NEURO HOSPITALIST PROGRESS NOTE   Subjective: Patient in bed, not opening eyes, not following commands family at bedside. LTM running.   Exam: Vitals:   06/20/19 0823 06/20/19 0953  BP: 122/72 (!) 147/79  Pulse: 69   Resp: 14   Temp: 98.3 F (36.8 C)   SpO2: 98%     Physical Exam  Constitutional: Appears well-developed and well-nourished.  Eyes: Normal external eye and conjunctiva. HENT: Normocephalic, no lesions, without obvious abnormality.   Musculoskeletal-no joint tenderness, deformity or swelling Cardiovascular: Normal rate and regular rhythm.  Respiratory: Effort normal, non-labored breathing saturations WNL GI: Soft.  No distension. There is no tenderness.  Skin: WDI   Neuro:  Mental Status:  does not  Follow commands, does not open eyes to voice or noxious for examiner. family reported that he opened his eyes to sons voice earlier this morning.,  Cranial Nerves:unable to visualize pupils as patient squeezes eyes tightly shut. Face appears symmetric. Motor: Increased tone  Sensory: Pinprick and light touch intact throughout, bilaterally Deep Tendon Reflexes: 2+ and symmetric biceps, patella Plantars: Right: downgoing   Left: downgoing Cerebellar: normal finger-to-nose, normal rapid alternating movements and normal heel-to-shin test Gait: normal gait and station    Medications:  Scheduled: . amLODipine  10 mg Oral Daily  . carBAMazepine  300 mg Per Tube BID  . chlorhexidine  15 mL Mouth Rinse BID  . clonazePAM  0.25 mg Oral BID  . enoxaparin (LOVENOX) injection  40 mg Subcutaneous Q24H  . hydrALAZINE  50 mg Oral TID  . isosorbide mononitrate  30 mg Oral QHS  . magnesium oxide  400 mg Oral Daily  . mouth rinse  15 mL Mouth Rinse q12n4p  . polyvinyl alcohol  2 drop Left Eye BID  . potassium chloride  40 mEq Per Tube BID  . saccharomyces boulardii  250 mg Oral Daily  . valproic acid  750 mg Oral Q8H   Continuous: . 0.9 % NaCl with  KCl 20 mEq / L 100 mL/hr at 06/20/19 0618  . ampicillin-sulbactam (UNASYN) IV    . lacosamide (VIMPAT) IV Stopped (06/20/19 0030)  . levETIRAcetam 1,500 mg (06/20/19 0948)   UJW:JXBJYNWGNFAOZ, albuterol, hydrALAZINE, hydrALAZINE, ketorolac, ondansetron **OR** ondansetron (ZOFRAN) IV  Pertinent Labs/Diagnostics: 06/19/19: VPA level < 10, carbamazepine level < 2.0, UA + UTI , blood culture : + staph, Cbc with slight evelvated white count ( 14.2),  EEG  Result Date: 06/19/2019 Charlsie Quest, MD     06/19/2019  2:11 PM Patient Name: Lee [name] MRN: [id number] Epilepsy Attending: Charlsie Quest Referring Physician/Provider: Dr. Lindie Spruce Date: 06/19/2019 Duration: 23.30 mins Patient history: 69 year old male with history of epilepsy presented with breakthrough seizures, continues to be altered.  EEG to evaluate for seizures, subclinical status. Level of alertness: Lethargic AEDs during EEG study: Ativan, Keppra Technical aspects: This EEG study was done with scalp electrodes positioned according to the 10-20 International system of electrode placement. Electrical activity was acquired at a sampling rate of 500Hz  and reviewed with a high frequency filter of 70Hz  and a low frequency filter of 1Hz . EEG data were recorded continuously and digitally stored. Description: No clear posterior dominant rhythm was seen.  EEG showed continuous generalized and lateralized left hemisphere polymorphic 3 to 6 Hz theta-delta slowing.  Hyperventilation and photic stimulation were not performed.   Of note, EEG was technically difficult  due to significant myogenic/shivering artifact. ABNORMALITY -Continuous slow, generalized and lateralized left hemisphere IMPRESSION: This technically difficult study is suggestive of cortical dysfunction in left hemisphere likely secondary to underlying structural abnormality, postictal state. No seizures or definite epileptiform discharges were seen throughout the recording.  Lora Havens   CT Head Wo Contrast  Result Date: 06/19/2019 CLINICAL DATA:  Headache, acute. EXAM: CT HEAD WITHOUT CONTRAST TECHNIQUE: Contiguous axial images were obtained from the base of the skull through the vertex without intravenous contrast. COMPARISON:  05/14/2019 FINDINGS: Brain: No evidence of acute infarction, hemorrhage, hydrocephalus, extra-axial collection or mass lesion/mass effect. Signs of atrophy and chronic microvascular ischemic change. Vascular: No hyperdense vessel or unexpected calcification. Skull: Normal. Negative for fracture or focal lesion. Sinuses/Orbits: Limited assessment of sinuses and orbits without acute process. No fluid level in the sinuses. Mildly motion limited exam. Other: Nasal airway in place. IMPRESSION: 1. No acute intracranial abnormality. 2. Signs of atrophy and chronic microvascular ischemic change. Electronically Signed   By: Zetta Bills M.D.   On: 06/19/2019 15:10   DG Chest Portable 1 View  Result Date: 06/19/2019 CLINICAL DATA:  Altered mental status EXAM: PORTABLE CHEST 1 VIEW COMPARISON:  Multiple prior studies most recent comparison CT chest from May 14, 2019 FINDINGS: Cardiomediastinal contours and hilar structures are normal. Subtle added density at the RIGHT lung base as compared to the LEFT particularly in the RIGHT middle lobe based on appearance of RIGHT heart border. No dense consolidation. No sign of pleural effusion on frontal radiograph. Visualized skeletal structures without acute process on limited assessment. IMPRESSION: Subtle added density at the RIGHT lung base particularly in the RIGHT middle lobe may represent residual airspace disease or recurrent process in the chest, continued follow-up is suggested to ensure resolution. Findings likely represent infection based on previous imaging. Electronically Signed   By: Zetta Bills M.D.   On: 06/19/2019 13:33   Overnight EEG with video  Result Date: 06/20/2019 Lora Havens, MD      06/20/2019  9:30 AM Patient Name: Lee [name] MRN: [id number] Epilepsy Attending: Lora Havens Referring Physician/Provider: Dr. Zeb Comfort Duration: 06/19/2019 1457 to 06/20/2019 0830  Patient history: 69 year old male with history of epilepsy presented with breakthrough seizures, continues to be altered.  EEG to evaluate for seizures, subclinical status.  Level of alertness: Lethargic  AEDs during EEG study: Ativan, Keppra  Technical aspects: This EEG study was done with scalp electrodes positioned according to the 10-20 International system of electrode placement. Electrical activity was acquired at a sampling rate of 500Hz  and reviewed with a high frequency filter of 70Hz  and a low frequency filter of 1Hz . EEG data were recorded continuously and digitally stored.  Description: No clear posterior dominant rhythm was seen. EEG showed continuous generalized polymorphic 3 to 6 Hz theta-delta slowing. Sharp waves were seen independently in left and right temporal region.  Hyperventilation and photic stimulation were not performed.    ABNORMALITY - Continuous slow, generalized - Sharp waves, left and right temporal region  IMPRESSION: This study showed evidence of left and right temporal epileptogenicity as well as moderate diffuse encephalopathy, non specific to etiology. No seizures were seen throughout the recording. Lora Havens   Assessment:   69 year old male with history of epilepsy who presented with breakthrough seizures.   VPA level < 10, carbamazepine level < 2.0, UA + UTI , blood culture : + staph, Cbc with slight elevated white count ( 14.2),   Impression:  Epilepsy  with breakthrough seizures r/t sepsis, non-compliance with AED Acute encephalopathy, likely related to seizures, postictal state, medications History of stroke   Recommendations:  -As patient is DNR/DNI, will carefully use IV Ativan as needed for breakthrough seizure -We will resume home antiepileptics  including Keppra 15 mg twice daily, Vimpat 300 mg twice daily, Depakote 750 every 8 hours. -We likely need will need NG/OG tube in the interim to resume carbamazepine 300 mg twice daily and Klonopin 0.25 mg twice daily -Continue seizure precautions, neuro checks every 4 hours -Management of rest of comorbidities per primary team  Valentina Lucks, MSN, NP-C [facility] [phone]  Attending neurologist's note to follow  06/20/2019, 10:37 AM I saw the patient significantly later in the day.  He is awake, able to tell me his name, able to follow commands readily.  He continues to be encephalopathic, with some perseveration, but is markedly improved compared to the earlier exam.  With no seizures on continuous EEG, will discontinue this at this time.  Ritta Slot, MD [facility] [phone]  If 7pm- 7am, please page neurology on call as listed in AMION.

## 2019-06-20 NOTE — Progress Notes (Signed)
PROGRESS NOTE    Lee [name]  WUJ:[id number] DOB: [birth year] DOA: 06/19/2019 PCP: Mirna Mires, MD    Brief Narrative:  69 year old man with history of CVA, advanced dementia, epilepsy and hypertension was brought to the hospital from long-term nursing home.  He lives in [place].  Does have history of seizure and he is on multiple medications.  Reportedly had multiple episodes of seizure at [facility], given Ativan with no improvement.  EMS gave him additional Versed and she is very stopped.  In the emergency room, hemodynamically stable.  Difficult to assess neurological status because of advanced dementia and postictal state.  Admitted with uncontrolled seizure.  Patient also found to be having elevated white cell count, elevated lactic acid, elevated procalcitonin and right lower lobe pneumonia.   Assessment & Plan:   Principal Problem:   Sepsis (HCC) Active Problems:   Acute encephalopathy   Seizures (HCC)   Aspiration pneumonia (HCC)   Metabolic acidosis with increased anion gap and accumulation of organic acids   Lactic acidosis   AKI (acute kidney injury) (HCC)  Sepsis present on admission: Resolving.  Due to aspiration pneumonia.  Currently NPO.  Cultures are negative.  MRSA negative.  Started on broad-spectrum antibiotics.  Discontinue and will keep on narrow spectrum with Unasyn until clinical improvement.  Patient has tolerated penicillin with no issues in the past.  Recurrent seizure: Stat EEG ruled out status epilepticus.  Patient is currently on LTM EEG monitoring and followed by neurology. Prior to admission medications, Keppra 1500 mg twice a day, changed to IV Tegretol 300 mg twice a day, changed to solution by NG tube.  Tegretol level is less than 2. clonazepam 0.25 mg 2 times a day, continued Vimpat 300 mg twice a day, changed to IV. Valproic acid 750 mg twice a day.  Valproic acid level less than 10. Subtherapeutic Tegretol and Depakote levels.   Probably due to poor oral intake. Neurology following and adjusting medications.  Acute metabolic encephalopathy with history of underlying advanced dementia and debility: Suspect likely due to seizure episodes.  CT head negative.  Lactic acidosis: Probably due to aspiration pneumonia as well as from seizure.  Treated with IV fluids with improvement.  Hypertensive urgency: Blood pressure initially elevated more than 200.  Home medications resumed.  On hydralazine as needed.  Acute kidney injury: Resolving with IV fluids.  Hypokalemia: Replace further.  Recheck levels tomorrow morning.  Check magnesium and phosphorus.  Goal of care: Poor prognosis.  Poor rehab options.  Advanced dementia and severe debility.  Now on NG tube and getting medications and food.  Followed by palliative care.  DNR/DNI.  Plan is to assess for his improvement for next few days.  DVT prophylaxis: Lovenox subcu Code Status: DNR/DNI Family Communication: None.  Will discuss with patient as well Disposition Plan: Status is: Inpatient  Remains inpatient appropriate because:Inpatient level of care appropriate due to severity of illness   Dispo: The patient is from: SNF              Anticipated d/c is to: SNF              Anticipated d/c date is: > 3 days              Patient currently is not medically stable to d/c.         Consultants:   Neurology  Procedures:   EEG  Antimicrobials:  Antibiotics Given (last 72 hours)    Date/Time Action  Medication Dose Rate   06/19/19 1357 New Bag/Given   ceFEPIme (MAXIPIME) 2 g in sodium chloride 0.9 % 100 mL IVPB 2 g 200 mL/hr   06/19/19 1403 New Bag/Given   vancomycin (VANCOCIN) IVPB 1000 mg/200 mL premix 1,000 mg 200 mL/hr   06/20/19 0348 New Bag/Given   vancomycin (VANCOREADY) IVPB 500 mg/100 mL 500 mg 100 mL/hr   06/20/19 0620 New Bag/Given   ceFEPIme (MAXIPIME) 2 g in sodium chloride 0.9 % 100 mL IVPB 2 g 200 mL/hr         Subjective: Patient seen  and examined.  No overnight events.  Remains lethargic and sleepy all night.  He was not able to keep up any conversation.  He moaned in pain when moving in the bed. Nursing reported no seizure episodes overnight.  Currently on LTM monitor.  Objective: Vitals:   06/20/19 0653 06/20/19 0700 06/20/19 0823 06/20/19 0953  BP: 119/79 125/76 122/72 (!) 147/79  Pulse: 68 69 69   Resp: 11 15 14    Temp:   98.3 F (36.8 C)   TempSrc:   Axillary   SpO2: 94% 94% 98%   Weight:      Height:        Intake/Output Summary (Last 24 hours) at 06/20/2019 1006 Last data filed at 06/20/2019 0758 Gross per 24 hour  Intake 2851.44 ml  Output 1051 ml  Net 1800.44 ml   Filed Weights   06/19/19 1244 06/19/19 1956  Weight: 61.4 kg 61.5 kg    Examination:  General exam: Appears sick, unable to interact.  Cachectic. Respiratory system: No added sounds. Cardiovascular system: S1 & S2 heard, RRR.  Gastrointestinal system: Soft.  Bowel sounds present.   Central nervous system: Sleepy and lethargic.  Not oriented.  Unable to keep of any conversation. Extremities: Symmetric 5 x 5 power.  Generalized weakness.  Does not follow commands.    Data Reviewed: I have personally reviewed following labs and imaging studies  CBC: Recent Labs  Lab 06/19/19 1254 06/20/19 0336  WBC 25.0* 14.2*  NEUTROABS 22.4*  --   HGB 15.9 13.0  HCT 49.0 38.4*  MCV 100.6* 97.0  PLT 378 834   Basic Metabolic Panel: Recent Labs  Lab 06/19/19 1430 06/20/19 0336  NA 141 141  K 3.1* 3.1*  CL 103 107  CO2 14* 26  GLUCOSE 151* 87  BUN 12 7*  CREATININE 1.22 0.73  CALCIUM 9.6 8.6*  MG  --  1.9   GFR: Estimated Creatinine Clearance: 74.7 mL/min (by C-G formula based on SCr of 0.73 mg/dL). Liver Function Tests: Recent Labs  Lab 06/19/19 1430  AST 39  ALT 26  ALKPHOS 101  BILITOT 0.5  PROT 7.9  ALBUMIN 3.5   No results for input(s): LIPASE, AMYLASE in the last 168 hours. No results for input(s): AMMONIA in  the last 168 hours. Coagulation Profile: No results for input(s): INR, PROTIME in the last 168 hours. Cardiac Enzymes: No results for input(s): CKTOTAL, CKMB, CKMBINDEX, TROPONINI in the last 168 hours. BNP (last 3 results) No results for input(s): PROBNP in the last 8760 hours. HbA1C: No results for input(s): HGBA1C in the last 72 hours. CBG: No results for input(s): GLUCAP in the last 168 hours. Lipid Profile: No results for input(s): CHOL, HDL, LDLCALC, TRIG, CHOLHDL, LDLDIRECT in the last 72 hours. Thyroid Function Tests: No results for input(s): TSH, T4TOTAL, FREET4, T3FREE, THYROIDAB in the last 72 hours. Anemia Panel: No results for input(s): VITAMINB12, FOLATE, FERRITIN,  TIBC, IRON, RETICCTPCT in the last 72 hours. Sepsis Labs: Recent Labs  Lab 06/19/19 1850 06/19/19 1943 06/20/19 0015 06/20/19 0336  PROCALCITON  --  4.17  --   --   LATICACIDVEN 2.5* 2.3* 2.5* 1.3    Recent Results (from the past 240 hour(s))  Blood culture (routine x 2)     Status: None (Preliminary result)   Collection Time: 06/19/19 12:54 PM   Specimen: BLOOD  Result Value Ref Range Status   Specimen Description BLOOD SITE NOT SPECIFIED  Final   Special Requests   Final    BOTTLES DRAWN AEROBIC AND ANAEROBIC Blood Culture adequate volume   Culture  Setup Time   Final    GRAM POSITIVE COCCI IN CLUSTERS ANAEROBIC BOTTLE ONLY CRITICAL RESULT CALLED TO, READ BACK BY AND VERIFIED WITH: PHARMD M MICCIA [id number] AT 910 AM BY CM Performed at [facility], [address] [place], [place] [address]    Culture GRAM POSITIVE COCCI  Final   Report Status PENDING  Incomplete  Blood Culture ID Panel (Reflexed)     Status: Abnormal   Collection Time: 06/19/19 12:54 PM  Result Value Ref Range Status   Enterococcus species NOT DETECTED NOT DETECTED Final   Listeria monocytogenes NOT DETECTED NOT DETECTED Final   Staphylococcus species DETECTED (A) NOT DETECTED Final    Comment: Methicillin (oxacillin)  susceptible coagulase negative staphylococcus. Possible blood culture contaminant (unless isolated from more than one blood culture draw or clinical case suggests pathogenicity). No antibiotic treatment is indicated for blood  culture contaminants. CRITICAL RESULT CALLED TO, READ BACK BY AND VERIFIED WITH: PHARMD M MICCIA [id number] AT 911 BY CM    Staphylococcus aureus (BCID) NOT DETECTED NOT DETECTED Final   Methicillin resistance NOT DETECTED NOT DETECTED Final   Streptococcus species NOT DETECTED NOT DETECTED Final   Streptococcus agalactiae NOT DETECTED NOT DETECTED Final   Streptococcus pneumoniae NOT DETECTED NOT DETECTED Final   Streptococcus pyogenes NOT DETECTED NOT DETECTED Final   Acinetobacter baumannii NOT DETECTED NOT DETECTED Final   Enterobacteriaceae species NOT DETECTED NOT DETECTED Final   Enterobacter cloacae complex NOT DETECTED NOT DETECTED Final   Escherichia coli NOT DETECTED NOT DETECTED Final   Klebsiella oxytoca NOT DETECTED NOT DETECTED Final   Klebsiella pneumoniae NOT DETECTED NOT DETECTED Final   Proteus species NOT DETECTED NOT DETECTED Final   Serratia marcescens NOT DETECTED NOT DETECTED Final   Haemophilus influenzae NOT DETECTED NOT DETECTED Final   Neisseria meningitidis NOT DETECTED NOT DETECTED Final   Pseudomonas aeruginosa NOT DETECTED NOT DETECTED Final   Candida albicans NOT DETECTED NOT DETECTED Final   Candida glabrata NOT DETECTED NOT DETECTED Final   Candida krusei NOT DETECTED NOT DETECTED Final   Candida parapsilosis NOT DETECTED NOT DETECTED Final   Candida tropicalis NOT DETECTED NOT DETECTED Final    Comment: Performed at [facility], [address], [place], [place] [address]  SARS Coronavirus 2 by RT PCR (hospital order, performed in [facility] hospital lab) Nasopharyngeal Nasopharyngeal Swab     Status: None   Collection Time: 06/19/19  1:26 PM   Specimen: Nasopharyngeal Swab  Result Value Ref Range Status   SARS Coronavirus  2 NEGATIVE NEGATIVE Final    Comment: (NOTE) SARS-CoV-2 target nucleic acids are NOT DETECTED.  The SARS-CoV-2 RNA is generally detectable in upper and lower respiratory specimens during the acute phase of infection. The lowest concentration of SARS-CoV-2 viral copies this assay can detect is  250 copies / mL. A negative result does not preclude SARS-CoV-2 infection and should not be used as the sole basis for treatment or other patient management decisions.  A negative result may occur with improper specimen collection / handling, submission of specimen other than nasopharyngeal swab, presence of viral mutation(s) within the areas targeted by this assay, and inadequate number of viral copies (<250 copies / mL). A negative result must be combined with clinical observations, patient history, and epidemiological information.  Fact Sheet for Patients:   BoilerBrush.com.cy  Fact Sheet for Healthcare Providers: https://pope.com/  This test is not yet approved or  cleared by the [place] FDA and has been authorized for detection and/or diagnosis of SARS-CoV-2 by FDA under an Emergency Use Authorization (EUA).  This EUA will remain in effect (meaning this test can be used) for the duration of the COVID-19 declaration under Section 564(b)(1) of the Act, 21 U.S.C. section 360bbb-3(b)(1), unless the authorization is terminated or revoked sooner.  Performed at [facility] Lab, [address] [place], [place] [address]   Blood culture (routine x 2)     Status: None (Preliminary result)   Collection Time: 06/19/19  1:26 PM   Specimen: BLOOD LEFT FOREARM  Result Value Ref Range Status   Specimen Description BLOOD LEFT FOREARM  Final   Special Requests   Final    BOTTLES DRAWN AEROBIC AND ANAEROBIC Blood Culture adequate volume   Culture   Final    NO GROWTH < 24 HOURS Performed at [facility], [address], [place],  [place] [address]    Report Status PENDING  Incomplete  MRSA PCR Screening     Status: None   Collection Time: 06/20/19  4:19 AM   Specimen: Nasal Mucosa; Nasopharyngeal  Result Value Ref Range Status   MRSA by PCR NEGATIVE NEGATIVE Final    Comment:        The GeneXpert MRSA Assay (FDA approved for NASAL specimens only), is one component of a comprehensive MRSA colonization surveillance program. It is not intended to diagnose MRSA infection nor to guide or monitor treatment for MRSA infections. Performed at [facility], [address] [place], [place] [address]          Radiology Studies: EEG  Result Date: 06/19/2019 Lee Quest, MD     06/19/2019  2:11 PM Patient Name: Lee [name] MRN: [id number] Epilepsy Attending: Charlsie Quest Referring Physician/Provider: Dr. Lindie Spruce Date: 06/19/2019 Duration: 23.30 mins Patient history: 69 year old male with history of epilepsy presented with breakthrough seizures, continues to be altered.  EEG to evaluate for seizures, subclinical status. Level of alertness: Lethargic AEDs during EEG study: Ativan, Keppra Technical aspects: This EEG study was done with scalp electrodes positioned according to the 10-20 International system of electrode placement. Electrical activity was acquired at a sampling rate of 500Hz  and reviewed with a high frequency filter of 70Hz  and a low frequency filter of 1Hz . EEG data were recorded continuously and digitally stored. Description: No clear posterior dominant rhythm was seen.  EEG showed continuous generalized and lateralized left hemisphere polymorphic 3 to 6 Hz theta-delta slowing.  Hyperventilation and photic stimulation were not performed.   Of note, EEG was technically difficult due to significant myogenic/shivering artifact. ABNORMALITY -Continuous slow, generalized and lateralized left hemisphere IMPRESSION: This technically difficult study is suggestive of cortical dysfunction in left  hemisphere likely secondary to underlying structural abnormality, postictal state. No seizures or definite epileptiform discharges were  seen throughout the recording. Lee Questriyanka O [name]   CT Head Wo Contrast  Result Date: 06/19/2019 CLINICAL DATA:  Headache, acute. EXAM: CT HEAD WITHOUT CONTRAST TECHNIQUE: Contiguous axial images were obtained from the base of the skull through the vertex without intravenous contrast. COMPARISON:  05/14/2019 FINDINGS: Brain: No evidence of acute infarction, hemorrhage, hydrocephalus, extra-axial collection or mass lesion/mass effect. Signs of atrophy and chronic microvascular ischemic change. Vascular: No hyperdense vessel or unexpected calcification. Skull: Normal. Negative for fracture or focal lesion. Sinuses/Orbits: Limited assessment of sinuses and orbits without acute process. No fluid level in the sinuses. Mildly motion limited exam. Other: Nasal airway in place. IMPRESSION: 1. No acute intracranial abnormality. 2. Signs of atrophy and chronic microvascular ischemic change. Electronically Signed   By: Donzetta KohutGeoffrey  Wile M.D.   On: 06/19/2019 15:10   DG Chest Portable 1 View  Result Date: 06/19/2019 CLINICAL DATA:  Altered mental status EXAM: PORTABLE CHEST 1 VIEW COMPARISON:  Multiple prior studies most recent comparison CT chest from May 14, 2019 FINDINGS: Cardiomediastinal contours and hilar structures are normal. Subtle added density at the RIGHT lung base as compared to the LEFT particularly in the RIGHT middle lobe based on appearance of RIGHT heart border. No dense consolidation. No sign of pleural effusion on frontal radiograph. Visualized skeletal structures without acute process on limited assessment. IMPRESSION: Subtle added density at the RIGHT lung base particularly in the RIGHT middle lobe may represent residual airspace disease or recurrent process in the chest, continued follow-up is suggested to ensure resolution. Findings likely represent infection based on  previous imaging. Electronically Signed   By: Donzetta KohutGeoffrey  Wile M.D.   On: 06/19/2019 13:33   Overnight EEG with video  Result Date: 06/20/2019 Lee [name], Lee O, MD     06/20/2019  9:30 AM Patient Name: Lee [name] MRN: [id number] Epilepsy Attending: Charlsie QuestPriyanka O [name] Referring Physician/Provider: Dr. Lindie SprucePriyanka [name] Duration: 06/19/2019 1457 to 06/20/2019 0830  Patient history: 69 year old male with history of epilepsy presented with breakthrough seizures, continues to be altered.  EEG to evaluate for seizures, subclinical status.  Level of alertness: Lethargic  AEDs during EEG study: Ativan, Keppra  Technical aspects: This EEG study was done with scalp electrodes positioned according to the 10-20 International system of electrode placement. Electrical activity was acquired at a sampling rate of 500Hz  and reviewed with a high frequency filter of 70Hz  and a low frequency filter of 1Hz . EEG data were recorded continuously and digitally stored.  Description: No clear posterior dominant rhythm was seen. EEG showed continuous generalized polymorphic 3 to 6 Hz theta-delta slowing. Sharp waves were seen independently in left and right temporal region.  Hyperventilation and photic stimulation were not performed.    ABNORMALITY - Continuous slow, generalized - Sharp waves, left and right temporal region  IMPRESSION: This study showed evidence of left and right temporal epileptogenicity as well as moderate diffuse encephalopathy, non specific to etiology. No seizures were seen throughout the recording. Lee Annabelle Harman [name]        Scheduled Meds: . amLODipine  10 mg Oral Daily  . carBAMazepine  300 mg Per Tube BID  . chlorhexidine  15 mL Mouth Rinse BID  . clonazePAM  0.25 mg Oral BID  . enoxaparin (LOVENOX) injection  40 mg Subcutaneous Q24H  . hydrALAZINE  50 mg Oral TID  . isosorbide mononitrate  30 mg Oral QHS  . magnesium oxide  400 mg Oral Daily  . mouth rinse  15 mL Mouth Rinse q12n4p  .  polyvinyl alcohol  2 drop Left Eye BID  . potassium chloride  40 mEq Per Tube BID  . saccharomyces boulardii  250 mg Oral Daily  . valproic acid  750 mg Oral Q8H   Continuous Infusions: . 0.9 % NaCl with KCl 20 mEq / L 100 mL/hr at 06/20/19 0618  . ampicillin-sulbactam (UNASYN) IV    . lacosamide (VIMPAT) IV Stopped (06/20/19 0030)  . levETIRAcetam 1,500 mg (06/20/19 0948)     LOS: 1 day    Time spent: 35 minutes    Dorcas Carrow, MD [facility] Pager [phone]

## 2019-06-20 NOTE — Procedures (Addendum)
Patient Name: KASSON LAMERE  MRN: [id number]  Epilepsy Attending: Charlsie Quest  Referring Physician/Provider: Dr. Lindie Spruce Duration: 06/19/2019 1457 to 06/20/2019 1642  Patient history: 69 year old male with history of epilepsy presented with breakthrough seizures, continues to be altered.  EEG to evaluate for seizures, subclinical status.  Level of alertness: Lethargic  AEDs during EEG study: Ativan, Keppra  Technical aspects: This EEG study was done with scalp electrodes positioned according to the 10-20 International system of electrode placement. Electrical activity was acquired at a sampling rate of 500Hz  and reviewed with a high frequency filter of 70Hz  and a low frequency filter of 1Hz . EEG data were recorded continuously and digitally stored.   Description: No clear posterior dominant rhythm was seen. EEG showed continuous generalized polymorphic 3 to 6 Hz theta-delta slowing. Sharp waves were seen independently in left and right temporal region.  Hyperventilation and photic stimulation were not performed.     ABNORMALITY - Continuous slow, generalized - Sharp waves, left and right temporal region  IMPRESSION: This study showed evidence of left and right temporal epileptogenicity as well as moderate diffuse encephalopathy, non specific to etiology. No seizures were seen throughout the recording.  Senie Lanese 

## 2019-06-20 NOTE — Progress Notes (Signed)
LTM EEG discontinued - no skin breakdown at unhook.   

## 2019-06-20 NOTE — Plan of Care (Signed)
Discussed in front of patient plan of care for the evening, reviewing his admission orders for medications and treatments with no evidence of learning at this time

## 2019-06-20 NOTE — Progress Notes (Signed)
Palliative Medicine Inpatient Follow Up Note   Reason for consult:  Discussion of comfort care  HPI:  Per intake H&P --> Lee Sharper Williamsis a 69 y.o.malewith medical history significant ofCVA, seizure order, advanced dementia, COPD, aspiration pneumonia,and prior alcohol abusewho [facility]. He was given 2 mg of Ativan IV and subsequently 5 mg of Versed which resolved seizures. Patient is bedbound at baseline and wife confirmed that he is a DNR/DNI to ED physician. Last hospitalized from 5/6-5/9 treated for multifocal pneumonia thought to be secondary to aspiration.  Palliative care was consulted to continue goals of care conversations in the setting of recurrent seizures and aspiration pneumonias. Asked to discuss comfort care more specifically.  Today's Discussion (06/20/2019): I have reviewed medical records including EPIC notes, labs and imaging, received report from bedside RN, assessed the patient.  I met with Lee [name] (spouse) this morning. We reviewed the present events that led to [facility] hospitalization. Lee [name] expressed frustration with Lee [name] [name] as Lee [name] had missed multiple doses of his antiepileptics at [place] and she was not informed. She feels that much of this could have been prevented.   We were able to talk about Lee [name] globally in terms of his health deficits inclusive of his dementia, sacral ulcer, and seizures. We discussed how he suffers from ongoing dysphagia in the setting of his dementia. Lee [name] understands that he will not be with [place] forever though she wants to verify his time here is as optimal as it can be.   We talked about how well Lee [name] was doing this past Wednesday at Lee [name] singing with her and enjoying his time with her. Lee [name] expressed that some of Lee [name] family members do not feel that she is doing her part to improve his condition. She shares how aggravating this is as she is the person  seeing him everyday.   Discussed hospice which Lee [name] has some familiarity with. The goals as of present are to continue treatments for a few more days to see if improvements can be made. Lee [name] shares that last time he was like this it took him three days to show improvements. She is realistic about the fact that he may not improve at which point in time it would be appropriate to proceed with hospice oriented level of care.  Discussed the importance of continued conversation with family and their  medical providers regarding overall plan of care and treatment options, ensuring decisions are within the context of the patients values and GOCs.  Provided "Hard Choices for Lee [name]" booklet.   Vital Signs Vitals:   06/20/19 0823 06/20/19 0953  BP: 122/72 (!) 147/79  Pulse: 69   Resp: 14   Temp: 98.3 F (36.8 C)   SpO2: 98%     Intake/Output Summary (Last 24 hours) at 06/20/2019 1136 Last data filed at 06/20/2019 0758 Gross per 24 hour  Intake 2851.44 ml  Output 1051 ml  Net 1800.44 ml   Last Weight  Most recent update: 06/19/2019  7:57 PM   Weight  61.5 kg (135 lb 9.3 oz)           Gen:  [facility] M HEENT: NGT in [name], dry mucous membranes CV: Regular rate and rhythm, no murmurs rubs or gallops PULM: clear to auscultation bilaterally. No wheezes/rales/rhonchi  ABD: soft/nontender/ normal bowel sounds  EXT: No edema  Neuro: Somnolent  SUMMARY OF RECOMMENDATIONS DNAR/DNI  Treat the treatable  MOST completed and placed on chart  DNR  completed and placed on shart  Continue with an additional 48 hours of current therapies to identify if improvements can be made  Chaplain consult  Ongoing support by PMT  Time Spent: 45 Greater than 50% of the time was spent in counseling and coordination of care ______________________________________________________________________________________ [facility] Team Team Cell Phone:  [phone] Please utilize secure chat with additional questions, if there is no response within 30 minutes please call the above phone number  Palliative Medicine Team providers are available by phone from 7am to 7pm daily and can be reached through the team cell phone.  Should this patient require assistance outside of these hours, please call the patient's attending physician.

## 2019-06-20 NOTE — Progress Notes (Signed)
°   06/20/19 1020  Clinical Encounter Type  Visited With Patient and family together  Visit Type Initial  Referral From Nurse  Consult/Referral To Chaplain  Spiritual Encounters  Spiritual Needs Prayer;Emotional   Chaplain responded to consult for support. Wife was present at bedside. Raevon was not awake and interactive, but would respond to being touched as if he were in pain. Chaplain visited with wife [name] and provided ministry of presence, active listening, and prayer. Chaplains remain available for support as needs arise.   Chaplain Resident, Amado Coe, M Div [phone] on-call pager

## 2019-06-21 LAB — BASIC METABOLIC PANEL
Anion gap: 11 (ref 5–15)
BUN: <5 mg/dL — ABNORMAL LOW (ref 8–23)
CO2: 26 mmol/L (ref 22–32)
Calcium: 8.8 mg/dL — ABNORMAL LOW (ref 8.9–10.3)
Chloride: 104 mmol/L (ref 98–111)
Creatinine, Ser: 0.61 mg/dL (ref 0.61–1.24)
GFR calc Af Amer: >60 mL/min (ref >60)
GFR calc non Af Amer: >60 mL/min (ref >60)
Glucose, Bld: 93 mg/dL (ref 70–99)
Potassium: 3.3 mmol/L — ABNORMAL LOW (ref 3.5–5.1)
Sodium: 141 mmol/L (ref 135–145)

## 2019-06-21 LAB — URINE CULTURE: Culture: >=100000 — AB

## 2019-06-21 LAB — CBC WITH DIFFERENTIAL/PLATELET
Abs Immature Granulocytes: 0.04 10*3/uL (ref 0.00–0.07)
Basophils Absolute: 0.1 10*3/uL (ref 0.0–0.1)
Basophils Relative: 1 %
Eosinophils Absolute: 0.2 10*3/uL (ref 0.0–0.5)
Eosinophils Relative: 2 %
HCT: 39.1 % (ref 39.0–52.0)
Hemoglobin: 13.1 g/dL (ref 13.0–17.0)
Immature Granulocytes: 0 %
Lymphocytes Relative: 23 %
Lymphs Abs: 2.5 10*3/uL (ref 0.7–4.0)
MCH: 32.3 pg (ref 26.0–34.0)
MCHC: 33.5 g/dL (ref 30.0–36.0)
MCV: 96.5 fL (ref 80.0–100.0)
Monocytes Absolute: 0.8 10*3/uL (ref 0.1–1.0)
Monocytes Relative: 7 %
Neutro Abs: 7.1 10*3/uL (ref 1.7–7.7)
Neutrophils Relative %: 67 %
Platelets: 263 10*3/uL (ref 150–400)
RBC: 4.05 MIL/uL — ABNORMAL LOW (ref 4.22–5.81)
RDW: 14.5 % (ref 11.5–15.5)
WBC: 10.7 10*3/uL — ABNORMAL HIGH (ref 4.0–10.5)
nRBC: 0 % (ref 0.0–0.2)

## 2019-06-21 LAB — MAGNESIUM: Magnesium: 1.6 mg/dL — ABNORMAL LOW (ref 1.7–2.4)

## 2019-06-21 LAB — PHOSPHORUS: Phosphorus: 2.7 mg/dL (ref 2.5–4.6)

## 2019-06-21 MED ORDER — FOSFOMYCIN TROMETHAMINE 3 G PO PACK
3.0000 g | PACK | Freq: Once | ORAL | Status: AC
  Administered 2019-06-21: 3 g via ORAL
  Filled 2019-06-21: qty 3

## 2019-06-21 NOTE — Plan of Care (Signed)
Discussed with patient plan of care for the evening, pain management and repositioning with no evidence of learning.

## 2019-06-21 NOTE — Progress Notes (Signed)
PROGRESS NOTE    Lee [name]  BMW:[id number] DOB: Feb 24, 1950 DOA: 06/19/2019 PCP: Mirna Mires, MD    Brief Narrative:  69 year old man with history of CVA, advanced dementia, epilepsy and hypertension was brought to the hospital from long-term nursing home.  He lives in [place].  Does have history of seizure and he is on multiple medications.  Reportedly had multiple episodes of seizure at [facility], given Ativan with no improvement.  EMS gave him additional Versed and she is very stopped.  In the emergency room, hemodynamically stable.  Difficult to assess neurological status because of advanced dementia and postictal state.  Admitted with uncontrolled seizure.  Patient also found to be having elevated white cell count, elevated lactic acid, elevated procalcitonin and right lower lobe pneumonia.   Assessment & Plan:   Principal Problem:   Sepsis (HCC) Active Problems:   Acute encephalopathy   Seizures (HCC)   Aspiration pneumonia (HCC)   Metabolic acidosis with increased anion gap and accumulation of organic acids   Lactic acidosis   AKI (acute kidney injury) (HCC)  Sepsis present on admission: Resolving. Aspiration pneumonia, on Unasyn.  Continue.  ESBL E. coli UTI: Presented with altered mental status and seizure, will treat as symptomatic UTI.  Fosfomycin 1 dose when able to take p.o.  Speech evaluation today.  Recurrent seizure: Stat EEG ruled out status epilepticus.  Underwent LTM EEG monitoring and followed by neurology.  No active seizures but evidence of seizure activities. Prior to admission medications, Keppra 1500 mg twice a day, on IV. Tegretol 300 mg twice a day, changed to solution by NG tube.  Tegretol level is less than 2. clonazepam 0.25 mg 2 times a day, continued Vimpat 300 mg twice a day, on IV. Valproic acid 750 mg twice a day.  Valproic acid level less than 10. Subtherapeutic Tegretol and Depakote levels.  Probably due to poor oral  intake. Neurology following and adjusting medications. NG tube fell off today morning.  Trying oral medications with applesauce.  Speech therapy consult pending. Neurology to change to oral medications.  Acute metabolic encephalopathy with history of underlying advanced dementia and debility: Suspect likely due to seizure episodes.  CT head negative.  Normalizing.  Lactic acidosis: Probably due to aspiration pneumonia as well as from seizure.  Treated with IV fluids with improvement.  Hypertensive urgency: Blood pressure initially elevated more than 200.  Home medications resumed.  On hydralazine as needed.  Acute kidney injury: Resolving with IV fluids.  Hypokalemia: Replace further.  Repeat levels pending.  Goal of care: Poor prognosis.  Poor rehab options.  Advanced dementia and severe debility.  Patient with advanced dementia.  Mentation is improving back to baseline.  Followed by palliative care.  Waiting for a speech evaluation, if enough intake he will go back to nursing home with outpatient palliative care follow-up.  DVT prophylaxis: Lovenox subcu Code Status: DNR/DNI Family Communication: None.  Will try to connect to wife. Disposition Plan: Status is: Inpatient  Remains inpatient appropriate because:Inpatient level of care appropriate due to severity of illness   Dispo: The patient is from: SNF              Anticipated d/c is to: SNF              Anticipated d/c date is: 2 days.              Patient currently is not medically stable to d/c.  No oral intake.  Consultants:   Neurology  Palliative medicine  Procedures:   EEG  Antimicrobials:  Antibiotics Given (last 72 hours)    Date/Time Action Medication Dose Rate   06/19/19 1357 New Bag/Given   ceFEPIme (MAXIPIME) 2 g in sodium chloride 0.9 % 100 mL IVPB 2 g 200 mL/hr   06/19/19 1403 New Bag/Given   vancomycin (VANCOCIN) IVPB 1000 mg/200 mL premix 1,000 mg 200 mL/hr   06/20/19 0348 New  Bag/Given   vancomycin (VANCOREADY) IVPB 500 mg/100 mL 500 mg 100 mL/hr   06/20/19 5621 New Bag/Given   ceFEPIme (MAXIPIME) 2 g in sodium chloride 0.9 % 100 mL IVPB 2 g 200 mL/hr   06/20/19 1425 New Bag/Given   Ampicillin-Sulbactam (UNASYN) 3 g in sodium chloride 0.9 % 100 mL IVPB 3 g 200 mL/hr   06/20/19 2159 New Bag/Given   Ampicillin-Sulbactam (UNASYN) 3 g in sodium chloride 0.9 % 100 mL IVPB 3 g 200 mL/hr   06/21/19 0459 New Bag/Given   Ampicillin-Sulbactam (UNASYN) 3 g in sodium chloride 0.9 % 100 mL IVPB 3 g 200 mL/hr         Subjective: Patient seen and examined.  No overnight events.  NG tube fell off.  He is more awake, not interactive though.  Wife reported this is his baseline.  Swallow evaluation pending.  RN was able to give him oral medications with applesauce.  Objective: Vitals:   06/21/19 0500 06/21/19 0523 06/21/19 0553 06/21/19 0600  BP:  (!) 162/86 (!) 164/91   Pulse: 72 76 84 81  Resp: 14 15 (!) 21 20  Temp:      TempSrc:      SpO2: 98% 97% 99% 96%  Weight:      Height:        Intake/Output Summary (Last 24 hours) at 06/21/2019 1116 Last data filed at 06/21/2019 1106 Gross per 24 hour  Intake 815.21 ml  Output 3000 ml  Net -2184.79 ml   Filed Weights   06/19/19 1244 06/19/19 1956  Weight: 61.4 kg 61.5 kg    Examination:  General exam: Chronically sick looking.  Comfortable.  Pleasantly confused.  Not following much commands, some unintelligible speech. Respiratory system: No added sounds. Cardiovascular system: S1 & S2 heard, RRR.  Gastrointestinal system: Soft.  Bowel sounds present.   Central nervous system: Awake.  Pleasant and confused. Extremities: Symmetric 5 x 5 power.  Generalized weakness.  Does not follow commands.    Data Reviewed: I have personally reviewed following labs and imaging studies  CBC: Recent Labs  Lab 06/19/19 1254 06/20/19 0336  WBC 25.0* 14.2*  NEUTROABS 22.4*  --   HGB 15.9 13.0  HCT 49.0 38.4*  MCV 100.6*  97.0  PLT 378 285   Basic Metabolic Panel: Recent Labs  Lab 06/19/19 1430 06/20/19 0336  NA 141 141  K 3.1* 3.1*  CL 103 107  CO2 14* 26  GLUCOSE 151* 87  BUN 12 7*  CREATININE 1.22 0.73  CALCIUM 9.6 8.6*  MG  --  1.9   GFR: Estimated Creatinine Clearance: 74.7 mL/min (by C-G formula based on SCr of 0.73 mg/dL). Liver Function Tests: Recent Labs  Lab 06/19/19 1430  AST 39  ALT 26  ALKPHOS 101  BILITOT 0.5  PROT 7.9  ALBUMIN 3.5   No results for input(s): LIPASE, AMYLASE in the last 168 hours. No results for input(s): AMMONIA in the last 168 hours. Coagulation Profile: No results for input(s): INR, PROTIME in the last 168 hours.  Cardiac Enzymes: No results for input(s): CKTOTAL, CKMB, CKMBINDEX, TROPONINI in the last 168 hours. BNP (last 3 results) No results for input(s): PROBNP in the last 8760 hours. HbA1C: No results for input(s): HGBA1C in the last 72 hours. CBG: No results for input(s): GLUCAP in the last 168 hours. Lipid Profile: No results for input(s): CHOL, HDL, LDLCALC, TRIG, CHOLHDL, LDLDIRECT in the last 72 hours. Thyroid Function Tests: No results for input(s): TSH, T4TOTAL, FREET4, T3FREE, THYROIDAB in the last 72 hours. Anemia Panel: No results for input(s): VITAMINB12, FOLATE, FERRITIN, TIBC, IRON, RETICCTPCT in the last 72 hours. Sepsis Labs: Recent Labs  Lab 06/19/19 1850 06/19/19 1943 06/20/19 0015 06/20/19 0336  PROCALCITON  --  4.17  --   --   LATICACIDVEN 2.5* 2.3* 2.5* 1.3    Recent Results (from the past 240 hour(s))  Urine culture     Status: Abnormal   Collection Time: 06/19/19 12:54 PM   Specimen: Urine, Random  Result Value Ref Range Status   Specimen Description URINE, RANDOM  Final   Special Requests   Final    NONE Performed at [facility], [address], [place], [place] [address]    Culture (A)  Final    >=100,000 COLONIES/mL ESCHERICHIA COLI Confirmed Extended Spectrum Beta-Lactamase Producer (ESBL).  In  bloodstream infections from ESBL organisms, carbapenems are preferred over piperacillin/tazobactam. They are shown to have a lower risk of mortality.    Report Status 06/21/2019 FINAL  Final   Organism ID, Bacteria ESCHERICHIA COLI (A)  Final      Susceptibility   Escherichia coli - MIC*    AMPICILLIN >=32 RESISTANT Resistant     CEFAZOLIN >=64 RESISTANT Resistant     CEFTRIAXONE >=64 RESISTANT Resistant     CIPROFLOXACIN <=0.25 SENSITIVE Sensitive     GENTAMICIN >=16 RESISTANT Resistant     IMIPENEM <=0.25 SENSITIVE Sensitive     NITROFURANTOIN <=16 SENSITIVE Sensitive     TRIMETH/SULFA >=320 RESISTANT Resistant     AMPICILLIN/SULBACTAM 16 INTERMEDIATE Intermediate     PIP/TAZO <=4 SENSITIVE Sensitive     * >=100,000 COLONIES/mL ESCHERICHIA COLI  Blood culture (routine x 2)     Status: None (Preliminary result)   Collection Time: 06/19/19 12:54 PM   Specimen: BLOOD  Result Value Ref Range Status   Specimen Description BLOOD SITE NOT SPECIFIED  Final   Special Requests   Final    BOTTLES DRAWN AEROBIC AND ANAEROBIC Blood Culture adequate volume   Culture  Setup Time   Final    GRAM POSITIVE COCCI IN CLUSTERS ANAEROBIC BOTTLE ONLY CRITICAL RESULT CALLED TO, READ BACK BY AND VERIFIED WITH: PHARMD M MICCIA [id number] AT 910 AM BY CM    Culture   Final    GRAM POSITIVE COCCI IDENTIFICATION TO FOLLOW Performed at [facility], [address], [place], [place] [address]    Report Status PENDING  Incomplete  Blood Culture ID Panel (Reflexed)     Status: Abnormal   Collection Time: 06/19/19 12:54 PM  Result Value Ref Range Status   Enterococcus species NOT DETECTED NOT DETECTED Final   Listeria monocytogenes NOT DETECTED NOT DETECTED Final   Staphylococcus species DETECTED (A) NOT DETECTED Final    Comment: Methicillin (oxacillin) susceptible coagulase negative staphylococcus. Possible blood culture contaminant (unless isolated from more than one blood culture draw or clinical case  suggests pathogenicity). No antibiotic treatment is indicated for blood  culture contaminants. CRITICAL RESULT CALLED TO, READ BACK BY AND  VERIFIED WITH: PHARMD M MICCIA [id number] AT 911 BY CM    Staphylococcus aureus (BCID) NOT DETECTED NOT DETECTED Final   Methicillin resistance NOT DETECTED NOT DETECTED Final   Streptococcus species NOT DETECTED NOT DETECTED Final   Streptococcus agalactiae NOT DETECTED NOT DETECTED Final   Streptococcus pneumoniae NOT DETECTED NOT DETECTED Final   Streptococcus pyogenes NOT DETECTED NOT DETECTED Final   Acinetobacter baumannii NOT DETECTED NOT DETECTED Final   Enterobacteriaceae species NOT DETECTED NOT DETECTED Final   Enterobacter cloacae complex NOT DETECTED NOT DETECTED Final   Escherichia coli NOT DETECTED NOT DETECTED Final   Klebsiella oxytoca NOT DETECTED NOT DETECTED Final   Klebsiella pneumoniae NOT DETECTED NOT DETECTED Final   Proteus species NOT DETECTED NOT DETECTED Final   Serratia marcescens NOT DETECTED NOT DETECTED Final   Haemophilus influenzae NOT DETECTED NOT DETECTED Final   Neisseria meningitidis NOT DETECTED NOT DETECTED Final   Pseudomonas aeruginosa NOT DETECTED NOT DETECTED Final   Candida albicans NOT DETECTED NOT DETECTED Final   Candida glabrata NOT DETECTED NOT DETECTED Final   Candida krusei NOT DETECTED NOT DETECTED Final   Candida parapsilosis NOT DETECTED NOT DETECTED Final   Candida tropicalis NOT DETECTED NOT DETECTED Final    Comment: Performed at [facility], [address], [place], [place] [address]  SARS Coronavirus 2 by RT PCR (hospital order, performed in [facility] hospital lab) Nasopharyngeal Nasopharyngeal Swab     Status: None   Collection Time: 06/19/19  1:26 PM   Specimen: Nasopharyngeal Swab  Result Value Ref Range Status   SARS Coronavirus 2 NEGATIVE NEGATIVE Final    Comment: (NOTE) SARS-CoV-2 target nucleic acids are NOT DETECTED.  The SARS-CoV-2 RNA is generally detectable in upper  and lower respiratory specimens during the acute phase of infection. The lowest concentration of SARS-CoV-2 viral copies this assay can detect is 250 copies / mL. A negative result does not preclude SARS-CoV-2 infection and should not be used as the sole basis for treatment or other patient management decisions.  A negative result may occur with improper specimen collection / handling, submission of specimen other than nasopharyngeal swab, presence of viral mutation(s) within the areas targeted by this assay, and inadequate number of viral copies (<250 copies / mL). A negative result must be combined with clinical observations, patient history, and epidemiological information.  Fact Sheet for Patients:   BoilerBrush.com.cy  Fact Sheet for Healthcare Providers: https://pope.com/  This test is not yet approved or  cleared by the [place] FDA and has been authorized for detection and/or diagnosis of SARS-CoV-2 by FDA under an Emergency Use Authorization (EUA).  This EUA will remain in effect (meaning this test can be used) for the duration of the COVID-19 declaration under Section 564(b)(1) of the Act, 21 U.S.C. section 360bbb-3(b)(1), unless the authorization is terminated or revoked sooner.  Performed at [facility], [address], [place], [place] [address]   Blood culture (routine x 2)     Status: None (Preliminary result)   Collection Time: 06/19/19  1:26 PM   Specimen: BLOOD LEFT FOREARM  Result Value Ref Range Status   Specimen Description BLOOD LEFT FOREARM  Final   Special Requests   Final    BOTTLES DRAWN AEROBIC AND ANAEROBIC Blood Culture adequate volume   Culture   Final    NO GROWTH 2 DAYS Performed at [facility], [address], [place], [place] [address]    Report Status PENDING  Incomplete  MRSA PCR Screening     Status: None   Collection Time: 06/20/19  4:19 AM   Specimen: Nasal Mucosa;  Nasopharyngeal  Result Value Ref Range Status   MRSA by PCR NEGATIVE NEGATIVE Final    Comment:        The GeneXpert MRSA Assay (FDA approved for NASAL specimens only), is one component of a comprehensive MRSA colonization surveillance program. It is not intended to diagnose MRSA infection nor to guide or monitor treatment for MRSA infections. Performed at [facility], [address] [place], [place] [address]          Radiology Studies: EEG  Result Date: 06/19/2019 Charlsie Quest, MD     06/19/2019  2:11 PM Patient Name: Lee [name] MRN: [id number] Epilepsy Attending: Charlsie Quest Referring Physician/Provider: Dr. Lindie Spruce Date: 06/19/2019 Duration: 23.30 mins Patient history: 69 year old male with history of epilepsy presented with breakthrough seizures, continues to be altered.  EEG to evaluate for seizures, subclinical status. Level of alertness: Lethargic AEDs during EEG study: Ativan, Keppra Technical aspects: This EEG study was done with scalp electrodes positioned according to the 10-20 International system of electrode placement. Electrical activity was acquired at a sampling rate of 500Hz  and reviewed with a high frequency filter of 70Hz  and a low frequency filter of 1Hz . EEG data were recorded continuously and digitally stored. Description: No clear posterior dominant rhythm was seen.  EEG showed continuous generalized and lateralized left hemisphere polymorphic 3 to 6 Hz theta-delta slowing.  Hyperventilation and photic stimulation were not performed.   Of note, EEG was technically difficult due to significant myogenic/shivering artifact. ABNORMALITY -Continuous slow, generalized and lateralized left hemisphere IMPRESSION: This technically difficult study is suggestive of cortical dysfunction in left hemisphere likely secondary to underlying structural abnormality, postictal state. No seizures or definite epileptiform discharges were seen throughout the  recording. Charlsie Quest   CT Head Wo Contrast  Result Date: 06/19/2019 CLINICAL DATA:  Headache, acute. EXAM: CT HEAD WITHOUT CONTRAST TECHNIQUE: Contiguous axial images were obtained from the base of the skull through the vertex without intravenous contrast. COMPARISON:  05/14/2019 FINDINGS: Brain: No evidence of acute infarction, hemorrhage, hydrocephalus, extra-axial collection or mass lesion/mass effect. Signs of atrophy and chronic microvascular ischemic change. Vascular: No hyperdense vessel or unexpected calcification. Skull: Normal. Negative for fracture or focal lesion. Sinuses/Orbits: Limited assessment of sinuses and orbits without acute process. No fluid level in the sinuses. Mildly motion limited exam. Other: Nasal airway in place. IMPRESSION: 1. No acute intracranial abnormality. 2. Signs of atrophy and chronic microvascular ischemic change. Electronically Signed   By: Donzetta Kohut M.D.   On: 06/19/2019 15:10   DG Chest Portable 1 View  Result Date: 06/19/2019 CLINICAL DATA:  Altered mental status EXAM: PORTABLE CHEST 1 VIEW COMPARISON:  Multiple prior studies most recent comparison CT chest from May 14, 2019 FINDINGS: Cardiomediastinal contours and hilar structures are normal. Subtle added density at the RIGHT lung base as compared to the LEFT particularly in the RIGHT middle lobe based on appearance of RIGHT heart border. No dense consolidation. No sign of pleural effusion on frontal radiograph. Visualized skeletal structures without acute process on limited assessment. IMPRESSION: Subtle added density at the RIGHT lung base particularly in the RIGHT middle lobe may represent residual airspace disease or recurrent process in the chest, continued follow-up is suggested to ensure resolution. Findings likely represent infection based on previous imaging. Electronically Signed   By: Donzetta Kohut  M.D.   On: 06/19/2019 13:33   Overnight EEG with video  Result Date: 06/20/2019 Charlsie Quest, MD     06/20/2019  5:43 PM Patient Name: Lee [name] MRN: [id number] Epilepsy Attending: Charlsie Quest Referring Physician/Provider: Dr. Lindie Spruce Duration: 06/19/2019 1457 to 06/20/2019 1642  Patient history: 68 year old male with history of epilepsy presented with breakthrough seizures, continues to be altered.  EEG to evaluate for seizures, subclinical status.  Level of alertness: Lethargic  AEDs during EEG study: Ativan, Keppra  Technical aspects: This EEG study was done with scalp electrodes positioned according to the 10-20 International system of electrode placement. Electrical activity was acquired at a sampling rate of 500Hz  and reviewed with a high frequency filter of 70Hz  and a low frequency filter of 1Hz . EEG data were recorded continuously and digitally stored.  Description: No clear posterior dominant rhythm was seen. EEG showed continuous generalized polymorphic 3 to 6 Hz theta-delta slowing. Sharp waves were seen independently in left and right temporal region.  Hyperventilation and photic stimulation were not performed.    ABNORMALITY - Continuous slow, generalized - Sharp waves, left and right temporal region  IMPRESSION: This study showed evidence of left and right temporal epileptogenicity as well as moderate diffuse encephalopathy, non specific to etiology. No seizures were seen throughout the recording. Priyanka Annabelle Harman        Scheduled Meds: . amLODipine  10 mg Oral Daily  . carBAMazepine  300 mg Per Tube BID  . chlorhexidine  15 mL Mouth Rinse BID  . clonazePAM  0.25 mg Oral BID  . enoxaparin (LOVENOX) injection  40 mg Subcutaneous Q24H  . fosfomycin  3 g Oral Once  . hydrALAZINE  50 mg Oral TID  . isosorbide mononitrate  30 mg Oral QHS  . magnesium oxide  400 mg Oral Daily  . mouth rinse  15 mL Mouth Rinse q12n4p  . polyvinyl alcohol  2 drop Left Eye BID  . potassium chloride  40 mEq Per Tube BID  . saccharomyces boulardii  250 mg Oral Daily   . sodium chloride flush  10-40 mL Intracatheter Q12H  . valproic acid  750 mg Oral Q8H   Continuous Infusions: . ampicillin-sulbactam (UNASYN) IV Stopped (06/21/19 0557)  . lacosamide (VIMPAT) IV 300 mg (06/21/19 1028)  . levETIRAcetam 1,500 mg (06/21/19 1026)     LOS: 2 days    Time spent: 30 minutes.    Dorcas Carrow, MD [facility] Pager [phone]

## 2019-06-21 NOTE — Progress Notes (Addendum)
NEURO HOSPITALIST PROGRESS NOTE   Subjective: Patient awake, alert, NAD. Does not answer questions.following commands on re-examination  And continues moving all 4 extremities spontaneously, and does talk today.  Per wife this is closer to his baseline. Exam: Vitals:   06/21/19 0553 06/21/19 0600  BP: (!) 164/91   Pulse: 84 81  Resp: (!) 21 20  Temp:    SpO2: 99% 96%    Physical Exam  Constitutional: Appears well-developed and well-nourished.  Eyes: Normal external eye and conjunctiva. HENT: Normocephalic, no lesions, without obvious abnormality.   Musculoskeletal-no joint tenderness, deformity or swelling Cardiovascular: Normal rate and regular rhythm.  Respiratory: Effort normal, non-labored breathing saturations WNL GI: Soft.  No distension. There is no tenderness.  Skin: WDI   Neuro:  Mental Status:  awake, alert,   Follow commands, opens eyes spontaneously, not completely oriented. Cranial Nerves: blinks to threat bilaterally, PERRL, EOEMI, Face appears symmetric. Sensory: responds to noxious in all 4 Cerebellar: No clear ataxia Gait: deferred    Medications:  Scheduled: . amLODipine  10 mg Oral Daily  . carBAMazepine  300 mg Per Tube BID  . chlorhexidine  15 mL Mouth Rinse BID  . clonazePAM  0.25 mg Oral BID  . enoxaparin (LOVENOX) injection  40 mg Subcutaneous Q24H  . hydrALAZINE  50 mg Oral TID  . isosorbide mononitrate  30 mg Oral QHS  . magnesium oxide  400 mg Oral Daily  . mouth rinse  15 mL Mouth Rinse q12n4p  . polyvinyl alcohol  2 drop Left Eye BID  . potassium chloride  40 mEq Per Tube BID  . saccharomyces boulardii  250 mg Oral Daily  . sodium chloride flush  10-40 mL Intracatheter Q12H  . valproic acid  750 mg Oral Q8H   Continuous: . ampicillin-sulbactam (UNASYN) IV Stopped (06/21/19 0557)  . lacosamide (VIMPAT) IV Stopped (06/20/19 2300)  . levETIRAcetam Stopped (06/21/19 0000)   ZOX:[id number], albuterol,  hydrALAZINE, hydrALAZINE, ketorolac, ondansetron **OR** ondansetron (ZOFRAN) IV, sodium chloride flush  Pertinent Labs/Diagnostics: 06/19/19: VPA level < 10, carbamazepine level < 2.0, UA + UTI , blood culture : + staph, Cbc with slight evelvated white count ( 14.2),  EEG  Result Date: 06/19/2019 Charlsie Quest, MD     06/19/2019  2:11 PM Patient Name: Lee [name] MRN: [id number] Epilepsy Attending: Charlsie Quest Referring Physician/Provider: Dr. Lindie Spruce Date: 06/19/2019 Duration: 23.30 mins Patient history: 69 year old male with history of epilepsy presented with breakthrough seizures, continues to be altered.  EEG to evaluate for seizures, subclinical status. Level of alertness: Lethargic AEDs during EEG study: Ativan, Keppra Technical aspects: This EEG study was done with scalp electrodes positioned according to the 10-20 International system of electrode placement. Electrical activity was acquired at a sampling rate of 500Hz  and reviewed with a high frequency filter of 70Hz  and a low frequency filter of 1Hz . EEG data were recorded continuously and digitally stored. Description: No clear posterior dominant rhythm was seen.  EEG showed continuous generalized and lateralized left hemisphere polymorphic 3 to 6 Hz theta-delta slowing.  Hyperventilation and photic stimulation were not performed.   Of note, EEG was technically difficult due to significant myogenic/shivering artifact. ABNORMALITY -Continuous slow, generalized and lateralized left hemisphere IMPRESSION: This technically difficult study is suggestive of cortical dysfunction in left hemisphere likely secondary to underlying structural abnormality, postictal state. No seizures or definite epileptiform  discharges were seen throughout the recording. Charlsie Quest   CT Head Wo Contrast  Result Date: 06/19/2019 CLINICAL DATA:  Headache, acute. EXAM: CT HEAD WITHOUT CONTRAST TECHNIQUE: Contiguous axial images were obtained from the  base of the skull through the vertex without intravenous contrast. COMPARISON:  05/14/2019 FINDINGS: Brain: No evidence of acute infarction, hemorrhage, hydrocephalus, extra-axial collection or mass lesion/mass effect. Signs of atrophy and chronic microvascular ischemic change. Vascular: No hyperdense vessel or unexpected calcification. Skull: Normal. Negative for fracture or focal lesion. Sinuses/Orbits: Limited assessment of sinuses and orbits without acute process. No fluid level in the sinuses. Mildly motion limited exam. Other: Nasal airway in place. IMPRESSION: 1. No acute intracranial abnormality. 2. Signs of atrophy and chronic microvascular ischemic change. Electronically Signed   By: Donzetta Kohut M.D.   On: 06/19/2019 15:10   DG Chest Portable 1 View  Result Date: 06/19/2019 CLINICAL DATA:  Altered mental status EXAM: PORTABLE CHEST 1 VIEW COMPARISON:  Multiple prior studies most recent comparison CT chest from May 14, 2019 FINDINGS: Cardiomediastinal contours and hilar structures are normal. Subtle added density at the RIGHT lung base as compared to the LEFT particularly in the RIGHT middle lobe based on appearance of RIGHT heart border. No dense consolidation. No sign of pleural effusion on frontal radiograph. Visualized skeletal structures without acute process on limited assessment. IMPRESSION: Subtle added density at the RIGHT lung base particularly in the RIGHT middle lobe may represent residual airspace disease or recurrent process in the chest, continued follow-up is suggested to ensure resolution. Findings likely represent infection based on previous imaging. Electronically Signed   By: Donzetta Kohut M.D.   On: 06/19/2019 13:33   Overnight EEG with video  Result Date: 06/20/2019 Charlsie Quest, MD     06/20/2019  5:43 PM Patient Name: Lee [name] MRN: [id number] Epilepsy Attending: Charlsie Quest Referring Physician/Provider: Dr. Lindie Spruce Duration: 06/19/2019 1457 to  06/20/2019 1642  Patient history: 69 year old male with history of epilepsy presented with breakthrough seizures, continues to be altered.  EEG to evaluate for seizures, subclinical status.  Level of alertness: Lethargic  AEDs during EEG study: Ativan, Keppra  Technical aspects: This EEG study was done with scalp electrodes positioned according to the 10-20 International system of electrode placement. Electrical activity was acquired at a sampling rate of 500Hz  and reviewed with a high frequency filter of 70Hz  and a low frequency filter of 1Hz . EEG data were recorded continuously and digitally stored.  Description: No clear posterior dominant rhythm was seen. EEG showed continuous generalized polymorphic 3 to 6 Hz theta-delta slowing. Sharp waves were seen independently in left and right temporal region.  Hyperventilation and photic stimulation were not performed.    ABNORMALITY - Continuous slow, generalized - Sharp waves, left and right temporal region  IMPRESSION: This study showed evidence of left and right temporal epileptogenicity as well as moderate diffuse encephalopathy, non specific to etiology. No seizures were seen throughout the recording.   Assessment:   69 year old male with history of epilepsy who presented with breakthrough seizures.  LTM d'c on 06/19/09. As per attending note yesterday. Exam has improved.     Impression:  Epilepsy with breakthrough seizures r/t sepsis, non-compliance with AED Acute encephalopathy, likely related to seizures, postictal state, medications.   Recommendations:  -Continue home antiepileptics including Keppra 1500 mg twice daily, Vimpat 300 mg twice daily, Depakote 750 every 8 hours. -continue carbamazepine 300 mg twice daily and Klonopin 0.25 mg twice  daily -Continue seizure precautions, neuro checks every 4 hours -Management of rest of comorbidities per primary team  Laurey Morale, MSN, NP-C [facility] [phone]  Given breakthrough is due to medication noncompliance, I would not change his home dosing.  He is improving, and at this point I think that just keeping him on his regular medications and treating his underlying infection would be the next step.  I would expect his mental status to continue to gradually improve.  Neurology will sign off at this time, please call with further questions or concerns.  Roland Rack, MD [facility] [phone]  If 7pm- 7am, please page neurology on call as listed in [place].

## 2019-06-21 NOTE — Plan of Care (Signed)
Discussed with patient plan of care for the evening, pain management and encourage to eat and drink with some teach back displayed

## 2019-06-21 NOTE — Plan of Care (Signed)
  Problem: Respiratory: Goal: Ability to maintain adequate ventilation will improve Outcome: Progressing   Problem: Activity: Goal: Risk for activity intolerance will decrease Outcome: Progressing   Problem: Nutrition: Goal: Adequate nutrition will be maintained Outcome: Progressing   Problem: Safety: Goal: Ability to remain free from injury will improve Outcome: Progressing   
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
## 2019-06-21 NOTE — Progress Notes (Addendum)
   Palliative Medicine Inpatient Follow Up Note   Reason for consult:  Discussion of comfort care  HPI:  Per intake H&P --> Lee Sharper Williamsis a 69 y.o.malewith medical history significant ofCVA, seizure order, advanced dementia, COPD, aspiration pneumonia,and prior alcohol abusewho [facility]. He was given 2 mg of Ativan IV and subsequently 5 mg of Versed which resolved seizures. Patient is bedbound at baseline and wife confirmed that he is a DNR/DNI to ED physician. Last hospitalized from 5/6-5/9 treated for multifocal pneumonia thought to be secondary to aspiration.  Palliative care was consulted to continue goals of care conversations in the setting of recurrent seizures and aspiration pneumonias. Asked to discuss comfort care more specifically.  Today's Discussion (06/21/2019): I have reviewed medical records including EPIC notes, labs and imaging, received report from bedside RN, Delsa Sale. Per Delsa Sale he had pulled out his NGT. We discussed having a swallow evaluation given that he appeared more alert this morning. I met with Lee [name] at bedside. He was more oriented this morning and able to respond to me with his name. He did not appear to be in any distress.   I called patients wife, Lee [name] to provide an update. She states that she plans on coming in this morning to play music for him. She expressed that she is optimistic for improvements.   Discussed the importance of continued conversation with family and their  medical providers regarding overall plan of care and treatment options, ensuring decisions are within the context of the patients values and GOCs.  Provided "Hard Choices for [employer]" booklet.   Vital Signs Vitals:   06/21/19 0553 06/21/19 0600  BP: (!) 164/91   Pulse: 84 81  Resp: (!) 21 20  Temp:    SpO2: 99% 96%    Intake/Output Summary (Last 24 hours) at 06/21/2019 0102 Last data filed at 06/21/2019 7253 Gross per 24 hour    Intake 915.21 ml  Output 2400 ml  Net -1484.79 ml   Last Weight  Most recent update: 06/19/2019  7:57 PM   Weight  61.5 kg (135 lb 9.3 oz)           Gen:  [facility] HEENT: NGT in place, dry mucous membranes CV: Regular rate and rhythm, no murmurs rubs or gallops PULM: clear to auscultation bilaterally. No wheezes/rales/rhonchi  ABD: soft/nontender/ normal bowel sounds  EXT: No edema  Neuro: A+O x1  SUMMARY OF RECOMMENDATIONS DNAR/DNI  Treat the treatable  Continue current course of care, patient has shown some improvements  MOST completed and placed on chart  DNR completed and placed on chart  Plan for resumption of OP Palliative care  Chaplain consult  Ongoing support by PMT  Time Spent: 25 Greater than 50% of the time was spent in counseling and coordination of care ______________________________________________________________________________________ [place] Team Team Cell Phone: [phone] Please utilize secure chat with additional questions, if there is no response within 30 minutes please call the above phone number  Palliative Medicine Team providers are available by phone from 7am to 7pm daily and can be reached through the team cell phone.  Should this patient require assistance outside of these hours, please call the patient's attending physician.

## 2019-06-22 LAB — BASIC METABOLIC PANEL
Anion gap: 7 (ref 5–15)
BUN: <5 mg/dL — ABNORMAL LOW (ref 8–23)
CO2: 29 mmol/L (ref 22–32)
Calcium: 8.7 mg/dL — ABNORMAL LOW (ref 8.9–10.3)
Chloride: 106 mmol/L (ref 98–111)
Creatinine, Ser: 0.66 mg/dL (ref 0.61–1.24)
GFR calc Af Amer: >60 mL/min (ref >60)
GFR calc non Af Amer: >60 mL/min (ref >60)
Glucose, Bld: 102 mg/dL — ABNORMAL HIGH (ref 70–99)
Potassium: 3.7 mmol/L (ref 3.5–5.1)
Sodium: 142 mmol/L (ref 135–145)

## 2019-06-22 MED ORDER — CARBAMAZEPINE 100 MG/5ML PO SUSP
300.0000 mg | Freq: Two times a day (BID) | ORAL | Status: DC
  Administered 2019-06-22 – 2019-06-24 (×5): 300 mg via ORAL
  Filled 2019-06-22 (×6): qty 15

## 2019-06-22 MED ORDER — LACOSAMIDE 50 MG PO TABS
300.0000 mg | ORAL_TABLET | Freq: Two times a day (BID) | ORAL | Status: DC
  Administered 2019-06-22 – 2019-06-24 (×5): 300 mg via ORAL
  Filled 2019-06-22: qty 2
  Filled 2019-06-22 (×4): qty 1

## 2019-06-22 MED ORDER — LEVETIRACETAM 750 MG PO TABS
1500.0000 mg | ORAL_TABLET | Freq: Two times a day (BID) | ORAL | Status: DC
  Administered 2019-06-22 – 2019-06-24 (×5): 1500 mg via ORAL
  Filled 2019-06-22 (×5): qty 2

## 2019-06-22 NOTE — TOC Initial Note (Signed)
Transition of Care [facility]) - Initial/Assessment Note    Patient Details  Name: Lee [name] MRN: [id number] Date of Birth: 08-15-1950  Transition of Care [facility]) CM/SW Contact:    Ross Ludwig, LCSW Phone Number: 06/22/2019, 11:21 AM  Clinical Narrative:                  Patient is a 69 year old male who is married.  Patient is alert and oriented x1, and is a long term care resident at [facility].  Patient has some confusion, CSW spoke to patient's wife to complete assessment and reviewed medical chart.  Patient is a long term care resident at [facility]  Per patient's wife he has been there for several years.  Patient originally had a CVA when he first went to [facility], and he unfortunately was not able to return back home per his wife because of his level of needs. Per patient's wife, she said, most of the time, he is taken care of, however for some reason he did not get his seizure medication.  CSW asked if patient's wife spoke to the administrator and DON of facility, she said yes.  Patient's wife also stated she is going to start visiting every day to make sure he is being taking care of properly.  CSW also talked to [facility], they said patient will not need a new Covid test before he returns.  CSW updated patient's wife, that CSW will start completing the FL2 paperwork to prepare patient to return to SNF once he is medically ready for discharge.     Expected Discharge Plan: Skilled Nursing Facility Barriers to Discharge: Continued Medical Work up   Patient Goals and CMS Choice Patient states their goals for this hospitalization and ongoing recovery are:: To return back to [facility] per patient's wife. CMS Medicare.gov Compare Post Acute Care list provided to:: Patient Represenative (must comment) Choice offered to / list presented to : Spouse  Expected Discharge Plan and Services Expected Discharge Plan: [place] In-house Referral: Clinical Social  Work   Post Acute Care Choice: [place] Living arrangements for the past 2 months: [place]                   DME Agency: NA       HH Arranged: NA          Prior Living Arrangements/Services Living arrangements for the past 2 months: [place] Lives with:: Facility Resident Patient language and need for interpreter reviewed:: Yes Do you feel safe going back to the place where you live?: Yes      Need for Family Participation in Patient Care: Yes (Comment) (Patient has some confusion) Care giver support system in place?: Yes (comment)   Criminal Activity/Legal Involvement Pertinent to Current Situation/Hospitalization: No - Comment as needed  Activities of Daily Living Home Assistive Devices/Equipment: Other (Comment) (UTA) ADL Screening (condition at time of admission) Patient's cognitive ability adequate to safely complete daily activities?: No Is the patient deaf or have difficulty hearing?: No Does the patient have difficulty seeing, even when wearing glasses/contacts?: No Does the patient have difficulty concentrating, remembering, or making decisions?: Yes Patient able to express need for assistance with ADLs?: No Does the patient have difficulty dressing or bathing?: Yes Independently performs ADLs?: No Communication: Independent, Dependent Is this a change from baseline?: Pre-admission baseline Dressing (OT): Dependent Is this a change from baseline?: Pre-admission baseline Grooming: Dependent Is this  a change from baseline?: Pre-admission baseline Feeding: Dependent Is this a change from baseline?: Pre-admission baseline Bathing: Dependent Is this a change from baseline?: Pre-admission baseline Toileting: Dependent Is this a change from baseline?: Pre-admission baseline In/Out Bed: Dependent Is this a change from baseline?: Pre-admission baseline Walks in Home: Dependent (Bedbound per wife) Is this a change from  baseline?: Pre-admission baseline Does the patient have difficulty walking or climbing stairs?: No Weakness of Legs: Both Weakness of Arms/Hands: Both  Permission Sought/Granted Permission sought to share information with : Facility Medical sales representative, Family Supports Permission granted to share information with : Yes, Release of Information Signed  Share Information with NAME: Vicki, Pasqual [phone]  [phone] or Zohaib, Heeney   [phone]  Permission granted to share info w AGENCY: [facility]        Emotional Assessment Appearance:: Appears stated age   Affect (typically observed): Accepting, Appropriate, Stable Orientation: : Oriented to Self Alcohol / Substance Use: Not Applicable Psych Involvement: No (comment)  Admission diagnosis:  Lower urinary tract infectious disease [N39.0] Seizure (HCC) [R56.9] Sepsis (HCC) [A41.9] Aspiration pneumonia, unspecified aspiration pneumonia type, unspecified laterality, unspecified part of lung (HCC) [J69.0] Sepsis, due to unspecified organism, unspecified whether acute organ dysfunction present [facility]) [A41.9] Patient Active Problem List   Diagnosis Date Noted  . Metabolic acidosis with increased anion gap and accumulation of organic acids 06/19/2019  . Lactic acidosis 06/19/2019  . AKI (acute kidney injury) (HCC) 06/19/2019  . Palliative care by specialist   . Goals of care, counseling/discussion   . DNR (do not resuscitate)   . Multifocal pneumonia 05/14/2019  . Sepsis (HCC) 03/29/2019  . Macrocytosis   . Protein-calorie malnutrition, severe 03/04/2019  . Acute respiratory failure with hypoxemia (HCC) 02/23/2019  . Pressure injury of skin 06/30/2018  . Acute respiratory failure with hypoxia (HCC)   . Seizure disorder (HCC) 06/25/2018  . Seizure (HCC) 08/29/2016  . Leukocytosis 08/29/2016  . History of CVA (cerebrovascular accident) 08/29/2016  . Somnolence 08/29/2016  . Post-ictal state (HCC)  08/29/2016  . Lip laceration   . Pain   . Leg weakness, bilateral   . Leg pain, bilateral 08/17/2016  . UTI (urinary tract infection) 08/15/2016  . Aggression 07/26/2016  . Dementia (HCC) 07/26/2016  . Elevated troponin 12/30/2015  . Chest pain 12/30/2015  . Aspiration pneumonia (HCC) 11/15/2013  . Status epilepticus (HCC) 11/14/2013  . Essential hypertension 11/14/2013  . Cerebral thrombosis with cerebral infarction (HCC) 11/07/2013  . Seizures (HCC) 11/06/2013  . Chronic airway obstruction, not elsewhere classified 09/22/2013  . Stroke (HCC) 09/22/2013  . Hyperlipidemia 09/18/2013  . Encephalopathy acute 09/11/2013  . Altered mental status 09/11/2013  . CVA (cerebral infarction) 09/11/2013  . Hyponatremia 09/11/2013  . Acute encephalopathy 09/11/2013  . Aftercare following surgery of the circulatory system, NEC 08/25/2012  . Foot swelling 02/04/2012  . Drainage from wound 01/04/2012  . Peripheral vascular disease (HCC) 11/05/2011  . Pain in limb 11/05/2011  . 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 PAD (peripheral artery disease) (HCC) 10/28/2011  . TOBACCO ABUSE 02/06/2007  . SYMPTOM, EDEMA 06/13/2006  . ERECTILE DYSFUNCTION 01/25/2006   PCP:  Mirna Mires, MD Pharmacy:   [facility] - [place], [place] - [address] [place] [address] Phone: [phone] Fax: [phone]     Social Determinants of Health (SDOH) Interventions    Readmission Risk Interventions Readmission Risk Prevention Plan 06/22/2019  Transportation  Screening Complete  PCP or Specialist Appt within 3-5 Days Complete  HRI or Home Care Consult Complete  Social Work Consult for Recovery Care Planning/Counseling Complete  Palliative Care Screening Complete  Medication Review Oceanographer) Referral to Pharmacy  Some recent data might be hidden

## 2019-06-22 NOTE — NC FL2 (Signed)
[place] LEVEL OF CARE SCREENING TOOL     IDENTIFICATION  Patient Name: Lee [name] Birthdate: 18-Feb-1950 Sex: male Admission Date (Current Location): 06/19/2019  [name] and [place] Number:  Lee [name] [id number] [name] and Address:  [facility] [place], [name] [address] [name], [name] [address]      Provider Number: [id number]  Attending Physician Name and Address:  Barb Merino, MD  Relative Name and Phone Number:  Lee, [name] [phone]  [phone]    Current Level of Care: Hospital Recommended Level of Care: [name] Prior Approval Number:    Date Approved/Denied:   PASRR Number: [id number]  Discharge Plan: SNF    Current Diagnoses: Patient Active Problem List   Diagnosis Date Noted  . Metabolic acidosis with increased anion gap and accumulation of organic acids 06/19/2019  . Lactic acidosis 06/19/2019  . AKI (acute kidney injury) ([name]) 06/19/2019  . Palliative care by specialist   . Goals of care, counseling/discussion   . DNR (do not resuscitate)   . Multifocal pneumonia 05/14/2019  . Sepsis ([place]) 03/29/2019  . Macrocytosis   . Protein-calorie malnutrition, severe 03/04/2019  . Acute respiratory failure with hypoxemia ([name]) 02/23/2019  . Pressure injury of skin 06/30/2018  . Acute respiratory failure with hypoxia ([place])   . Seizure disorder ([place]) 06/25/2018  . Seizure ([name]) 08/29/2016  . Leukocytosis 08/29/2016  . History of CVA (cerebrovascular accident) 08/29/2016  . Somnolence 08/29/2016  . Post-ictal state ([place]) 08/29/2016  . Lip laceration   . Pain   . Leg weakness, bilateral   . Leg pain, bilateral 08/17/2016  . UTI (urinary tract infection) 08/15/2016  . Aggression 07/26/2016  . Dementia ([name]) 07/26/2016  . Elevated troponin 12/30/2015  . Chest pain 12/30/2015  . Aspiration pneumonia ([name]) 11/15/2013  . Status epilepticus ([name]) 11/14/2013  . Essential hypertension  11/14/2013  . Cerebral thrombosis with cerebral infarction ([place]) 11/07/2013  . Seizures ([place]) 11/06/2013  . Chronic airway obstruction, not elsewhere classified 09/22/2013  . Stroke ([name]) 09/22/2013  . Hyperlipidemia 09/18/2013  . Encephalopathy acute 09/11/2013  . Altered mental status 09/11/2013  . CVA (cerebral infarction) 09/11/2013  . Hyponatremia 09/11/2013  . Acute encephalopathy 09/11/2013  . Aftercare following surgery of the circulatory system, [name] 08/25/2012  . Foot swelling 02/04/2012  . Drainage from wound 01/04/2012  . Peripheral vascular disease ([place]) 11/05/2011  . Pain in limb 11/05/2011  . Chronic total occlusion of artery of the extremities ([name]) 11/05/2011  . PAD (peripheral artery disease) ([name]) 10/28/2011  . TOBACCO ABUSE 02/06/2007  . SYMPTOM, EDEMA 06/13/2006  . ERECTILE DYSFUNCTION 01/25/2006    Orientation RESPIRATION BLADDER Height & Weight     Self  O2 (2L) Incontinent Weight: 135 lb 9.3 oz (61.5 kg) Height:  5\' 10"  (177.8 cm)  BEHAVIORAL SYMPTOMS/MOOD NEUROLOGICAL BOWEL NUTRITION STATUS    Convulsions/Seizures Incontinent Diet (Pureed)  AMBULATORY STATUS COMMUNICATION OF NEEDS Skin   Total Care Verbally PU Stage and Appropriate Care   PU Stage 2 Dressing:  (PRN dressing change) PU Stage 3 Dressing:  (PRN)                 Personal Care Assistance Level of Assistance  Bathing, Feeding, Dressing Bathing Assistance: Maximum assistance Feeding assistance: Maximum assistance Dressing Assistance: Maximum assistance     Functional Limitations Info  Sight, Hearing, Speech Sight Info: Adequate Hearing Info: Adequate Speech Info: Adequate    SPECIAL CARE FACTORS FREQUENCY  Contractures Contractures Info: Not present    Additional Factors Info  Code Status, Allergies Code Status Info: DNR Allergies Info: Orange Juice, Vicodin, Penicillins Oxycodone           Current Medications (06/22/2019):  This is the  current hospital active medication list Current Facility-Administered Medications  Medication Dose Route Frequency Provider Last Rate Last Admin  . acetaminophen (TYLENOL) tablet 650 mg  650 mg Oral Q6H PRN Madelyn Flavors A, MD   650 mg at 06/21/19 2257  . albuterol (PROVENTIL) (2.5 MG/3ML) 0.083% nebulizer solution 2.5 mg  2.5 mg Nebulization Q6H PRN Smith, Rondell A, MD      . amLODipine (NORVASC) tablet 10 mg  10 mg Oral Daily Smith, Rondell A, MD   10 mg at 06/22/19 1000  . Ampicillin-Sulbactam (UNASYN) 3 g in sodium chloride 0.9 % 100 mL IVPB  3 g Intravenous Q8H Dorcas Carrow, MD   Stopped at 06/22/19 0556  . carBAMazepine (TEGRETOL) 100 MG/5ML suspension 300 mg  300 mg Oral BID Dorcas Carrow, MD   300 mg at 06/22/19 1000  . chlorhexidine (PERIDEX) 0.12 % solution 15 mL  15 mL Mouth Rinse BID Madelyn Flavors A, MD   15 mL at 06/21/19 2258  . clonazePAM (KLONOPIN) disintegrating tablet 0.25 mg  0.25 mg Oral BID Madelyn Flavors A, MD   0.25 mg at 06/21/19 2259  . enoxaparin (LOVENOX) injection 40 mg  40 mg Subcutaneous Q24H Smith, Rondell A, MD   40 mg at 06/20/19 1619  . hydrALAZINE (APRESOLINE) injection 10 mg  10 mg Intravenous Q4H PRN Madelyn Flavors A, MD      . hydrALAZINE (APRESOLINE) tablet 25 mg  25 mg Oral Daily PRN Smith, Rondell A, MD      . hydrALAZINE (APRESOLINE) tablet 50 mg  50 mg Oral TID Madelyn Flavors A, MD   50 mg at 06/22/19 1000  . isosorbide mononitrate (IMDUR) 24 hr tablet 30 mg  30 mg Oral QHS Smith, Rondell A, MD   30 mg at 06/21/19 2257  . ketorolac (TORADOL) 15 MG/ML injection 7.5 mg  7.5 mg Intravenous Q8H PRN Dorcas Carrow, MD   7.5 mg at 06/20/19 1022  . lacosamide (VIMPAT) tablet 300 mg  300 mg Oral BID Dorcas Carrow, MD   300 mg at 06/22/19 1000  . levETIRAcetam (KEPPRA) tablet 1,500 mg  1,500 mg Oral BID Dorcas Carrow, MD   1,500 mg at 06/22/19 1000  . magnesium oxide (MAG-OX) tablet 400 mg  400 mg Oral Daily Katrinka Blazing, Rondell A, MD   400 mg at 06/22/19 1000   . MEDLINE mouth rinse  15 mL Mouth Rinse q12n4p Smith, Rondell A, MD   15 mL at 06/20/19 1622  . ondansetron (ZOFRAN) tablet 4 mg  4 mg Oral Q6H PRN Madelyn Flavors A, MD   4 mg at 06/21/19 2259   Or  . ondansetron (ZOFRAN) injection 4 mg  4 mg Intravenous Q6H PRN Madelyn Flavors A, MD   4 mg at 06/19/19 2258  . polyvinyl alcohol (LIQUIFILM TEARS) 1.4 % ophthalmic solution 2 drop  2 drop Left Eye BID Legrand Pitts, RPH   2 drop at 06/22/19 1000  . saccharomyces boulardii (FLORASTOR) capsule 250 mg  250 mg Oral Daily Smith, Rondell A, MD   250 mg at 06/22/19 1000  . sodium chloride flush (NS) 0.9 % injection 10-40 mL  10-40 mL Intracatheter Q12H Dorcas Carrow, MD   10 mL at 06/22/19 1000  . sodium chloride  flush (NS) 0.9 % injection 10-40 mL  10-40 mL Intracatheter PRN Dorcas Carrow, MD      . valproic acid (DEPAKENE) 250 MG/5ML solution 750 mg  750 mg Oral Q8H Smith, Rondell A, MD   750 mg at 06/22/19 1700     Discharge Medications: Please see discharge summary for a list of discharge medications.  Relevant Imaging Results:  Relevant Lab Results:   Additional Information SSN [id number]  Darleene Cleaver, LCSW

## 2019-06-22 NOTE — Plan of Care (Signed)
  Problem: Activity: Goal: Risk for activity intolerance will decrease Outcome: Progressing   Problem: Pain Managment: Goal: General experience of comfort will improve Outcome: Progressing   Problem: Safety: Goal: Ability to remain free from injury will improve Outcome: Progressing   

## 2019-06-22 NOTE — Plan of Care (Signed)
Discussed with patient plan of care for the evening, pain management and importance of eating and drinking with some teach back displayed

## 2019-06-22 NOTE — Progress Notes (Signed)
PROGRESS NOTE    ROHAN JUENGER  HCW:[id number] DOB: 29-Oct-1950 DOA: 06/19/2019 PCP: Mirna Mires, MD    Brief Narrative:  69 year old man with history of CVA, advanced dementia, epilepsy and hypertension was brought to the hospital from long-term nursing home.  He lives in [place].  Does have history of seizure and he is on multiple medications.  Reportedly had multiple episodes of seizure at [facility], given Ativan with no improvement.  EMS gave him additional Versed and she is very stopped.  In the emergency room, hemodynamically stable.  Difficult to assess neurological status because of advanced dementia and postictal state.  Admitted with uncontrolled seizure.  Patient also found to be having elevated white cell count, elevated lactic acid, elevated procalcitonin and right lower lobe pneumonia.   Assessment & Plan:   Principal Problem:   Sepsis (HCC) Active Problems:   Acute encephalopathy   Seizures (HCC)   Aspiration pneumonia (HCC)   Metabolic acidosis with increased anion gap and accumulation of organic acids   Lactic acidosis   AKI (acute kidney injury) (HCC)  Sepsis present on admission: Resolving. Aspiration pneumonia, on Unasyn.  Continue today. ESBL E. coli UTI: Presented with altered mental status and seizure, will treat as symptomatic UTI.  Fosfomycin 1 dose given on 6/13. Clinically stabilizing.  Recurrent seizure: Stat EEG ruled out status epilepticus.  Underwent LTM EEG monitoring and followed by neurology.  No active seizures but evidence of seizure activities. Prior to admission medications, Keppra 1500 mg twice a day, on IV.  Changed to oral today. Tegretol 300 mg twice a day, changed to solution by NG tube.  Tegretol level is less than 2.  Changed to oral today. clonazepam 0.25 mg 2 times a day, continued Vimpat 300 mg twice a day, on IV.  Changed to oral today. Valproic acid 750 mg twice a day.  Valproic acid level less than 10. Subtherapeutic  Tegretol and Depakote levels.  Probably due to poor oral intake. Followed by neurology.  Now taking oral medications and diet.   Acute metabolic encephalopathy with history of underlying advanced dementia and debility: Suspect likely due to seizure episodes.  CT head negative.  Normalizing.  Lactic acidosis: Probably due to aspiration pneumonia as well as from seizure.  Treated with IV fluids with improvement.  Hypertensive urgency: Blood pressure initially elevated more than 200.  Home medications resumed.  On hydralazine as needed.  Acute kidney injury: Resolving with IV fluids.  Discontinue IV fluids.  Hypokalemia: Replaced.  Goal of care:  Patient with advanced dementia.  Mentation is improving back to baseline.  Followed by palliative care.  Started on dysphagia diet. Will monitor for oral intake and able to tolerate antiseizure medications today in the hospital. Discharge back to long-term nursing home if remains stable by tomorrow.  DVT prophylaxis: Lovenox subcu Code Status: DNR/DNI Family Communication: None today at the bedside. Disposition Plan: Status is: Inpatient  Remains inpatient appropriate because:Inpatient level of care appropriate due to severity of illness   Dispo: The patient is from: SNF              Anticipated d/c is to: SNF              Anticipated d/c date is: [employer]vertising account [employer].              Patient currently is not medically stable to d/c.  No oral intake.         Consultants:   Neurology  Palliative medicine  Procedures:   EEG  Antimicrobials:  Antibiotics Given (last 72 hours)    Date/Time Action Medication Dose Rate   06/19/19 1357 New Bag/Given   ceFEPIme (MAXIPIME) 2 g in sodium chloride 0.9 % 100 mL IVPB 2 g 200 mL/hr   06/19/19 1403 New Bag/Given   vancomycin (VANCOCIN) IVPB 1000 mg/200 mL premix 1,000 mg 200 mL/hr   06/20/19 0348 New Bag/Given   vancomycin (VANCOREADY) IVPB 500 mg/100 mL 500 mg 100 mL/hr   06/20/19 0620 New  Bag/Given   ceFEPIme (MAXIPIME) 2 g in sodium chloride 0.9 % 100 mL IVPB 2 g 200 mL/hr   06/20/19 1425 New Bag/Given   Ampicillin-Sulbactam (UNASYN) 3 g in sodium chloride 0.9 % 100 mL IVPB 3 g 200 mL/hr   06/20/19 2159 New Bag/Given   Ampicillin-Sulbactam (UNASYN) 3 g in sodium chloride 0.9 % 100 mL IVPB 3 g 200 mL/hr   06/21/19 0459 New Bag/Given   Ampicillin-Sulbactam (UNASYN) 3 g in sodium chloride 0.9 % 100 mL IVPB 3 g 200 mL/hr   06/21/19 1434 Given   fosfomycin (MONUROL) packet 3 g 3 g    06/21/19 1434 New Bag/Given   Ampicillin-Sulbactam (UNASYN) 3 g in sodium chloride 0.9 % 100 mL IVPB 3 g 200 mL/hr   06/21/19 2220 New Bag/Given   Ampicillin-Sulbactam (UNASYN) 3 g in sodium chloride 0.9 % 100 mL IVPB 3 g 200 mL/hr   06/22/19 0520 New Bag/Given   Ampicillin-Sulbactam (UNASYN) 3 g in sodium chloride 0.9 % 100 mL IVPB 3 g 200 mL/hr         Subjective: Patient seen and examined.  No overnight events.  He was able to eat with help.  He was able to take his oral medications last night.  Patient himself is pleasant, quiet, able to say he is doing fine.  Objective: Vitals:   06/22/19 0500 06/22/19 0525 06/22/19 0548 06/22/19 0818  BP:   121/78 (!) 146/83  Pulse: 65 64 66 67  Resp: 12 13 12 14   Temp:    98 F (36.7 C)  TempSrc:    Axillary  SpO2: 99% 98% 100% 100%  Weight:      Height:        Intake/Output Summary (Last 24 hours) at 06/22/2019 0952 Last data filed at 06/22/2019 0556 Gross per 24 hour  Intake 996.87 ml  Output 1935 ml  Net -938.13 ml   Filed Weights   06/19/19 1244 06/19/19 1956  Weight: 61.4 kg 61.5 kg    Examination: Physical Exam Constitutional:      Comments: Cachectic, chronically sick looking not in any distress.  Pleasantly confused.  Awake and alert but not oriented.  HENT:     Head: Normocephalic.  Cardiovascular:     Rate and Rhythm: Normal rate and regular rhythm.  Pulmonary:     Breath sounds: Normal breath sounds.  Neurological:       Comments: No focal deficit.  Moving all extremities.  Pleasant but not oriented.       Data Reviewed: I have personally reviewed following labs and imaging studies  CBC: Recent Labs  Lab 06/19/19 1254 06/20/19 0336 06/21/19 1129  WBC 25.0* 14.2* 10.7*  NEUTROABS 22.4*  --  7.1  HGB 15.9 13.0 13.1  HCT 49.0 38.4* 39.1  MCV 100.6* 97.0 96.5  PLT 378 285 263   Basic Metabolic Panel: Recent Labs  Lab 06/19/19 1430 06/20/19 0336 06/21/19 1129  NA 141 141 141  K 3.1* 3.1* 3.3*  CL 103 107 104  CO2 14* 26 26  GLUCOSE 151* 87 93  BUN 12 7* <5*  CREATININE 1.22 0.73 0.61  CALCIUM 9.6 8.6* 8.8*  MG  --  1.9 1.6*  PHOS  --   --  2.7   GFR: Estimated Creatinine Clearance: 74.7 mL/min (by C-G formula based on SCr of 0.61 mg/dL). Liver Function Tests: Recent Labs  Lab 06/19/19 1430  AST 39  ALT 26  ALKPHOS 101  BILITOT 0.5  PROT 7.9  ALBUMIN 3.5   No results for input(s): LIPASE, AMYLASE in the last 168 hours. No results for input(s): AMMONIA in the last 168 hours. Coagulation Profile: No results for input(s): INR, PROTIME in the last 168 hours. Cardiac Enzymes: No results for input(s): CKTOTAL, CKMB, CKMBINDEX, TROPONINI in the last 168 hours. BNP (last 3 results) No results for input(s): PROBNP in the last 8760 hours. HbA1C: No results for input(s): HGBA1C in the last 72 hours. CBG: No results for input(s): GLUCAP in the last 168 hours. Lipid Profile: No results for input(s): CHOL, HDL, LDLCALC, TRIG, CHOLHDL, LDLDIRECT in the last 72 hours. Thyroid Function Tests: No results for input(s): TSH, T4TOTAL, FREET4, T3FREE, THYROIDAB in the last 72 hours. Anemia Panel: No results for input(s): VITAMINB12, FOLATE, FERRITIN, TIBC, IRON, RETICCTPCT in the last 72 hours. Sepsis Labs: Recent Labs  Lab 06/19/19 1850 06/19/19 1943 06/20/19 0015 06/20/19 0336  PROCALCITON  --  4.17  --   --   LATICACIDVEN 2.5* 2.3* 2.5* 1.3    Recent Results (from the past  240 hour(s))  Urine culture     Status: Abnormal   Collection Time: 06/19/19 12:54 PM   Specimen: Urine, Random  Result Value Ref Range Status   Specimen Description URINE, RANDOM  Final   Special Requests   Final    NONE Performed at [facility], [address], [place], [place] [address]    Culture (A)  Final    >=100,000 COLONIES/mL ESCHERICHIA COLI Confirmed Extended Spectrum Beta-Lactamase Producer (ESBL).  In bloodstream infections from ESBL organisms, carbapenems are preferred over piperacillin/tazobactam. They are shown to have a lower risk of mortality.    Report Status 06/21/2019 FINAL  Final   Organism ID, Bacteria ESCHERICHIA COLI (A)  Final      Susceptibility   Escherichia coli - MIC*    AMPICILLIN >=32 RESISTANT Resistant     CEFAZOLIN >=64 RESISTANT Resistant     CEFTRIAXONE >=64 RESISTANT Resistant     CIPROFLOXACIN <=0.25 SENSITIVE Sensitive     GENTAMICIN >=16 RESISTANT Resistant     IMIPENEM <=0.25 SENSITIVE Sensitive     NITROFURANTOIN <=16 SENSITIVE Sensitive     TRIMETH/SULFA >=320 RESISTANT Resistant     AMPICILLIN/SULBACTAM 16 INTERMEDIATE Intermediate     PIP/TAZO <=4 SENSITIVE Sensitive     * >=100,000 COLONIES/mL ESCHERICHIA COLI  Blood culture (routine x 2)     Status: None (Preliminary result)   Collection Time: 06/19/19 12:54 PM   Specimen: BLOOD  Result Value Ref Range Status   Specimen Description BLOOD SITE NOT SPECIFIED  Final   Special Requests   Final    BOTTLES DRAWN AEROBIC AND ANAEROBIC Blood Culture adequate volume   Culture  Setup Time   Final    GRAM POSITIVE COCCI IN CLUSTERS ANAEROBIC BOTTLE ONLY CRITICAL RESULT CALLED TO, READ BACK BY AND VERIFIED WITH: PHARMD M MICCIA [id number] AT 910 AM BY CM    Culture   Final    Romie Minus  POSITIVE COCCI IDENTIFICATION TO FOLLOW Performed at [facility], [address] [place], [place] [address]    Report Status PENDING  Incomplete  Blood Culture ID Panel (Reflexed)     Status:  Abnormal   Collection Time: 06/19/19 12:54 PM  Result Value Ref Range Status   Enterococcus species NOT DETECTED NOT DETECTED Final   Listeria monocytogenes NOT DETECTED NOT DETECTED Final   Staphylococcus species DETECTED (A) NOT DETECTED Final    Comment: Methicillin (oxacillin) susceptible coagulase negative staphylococcus. Possible blood culture contaminant (unless isolated from more than one blood culture draw or clinical case suggests pathogenicity). No antibiotic treatment is indicated for blood  culture contaminants. CRITICAL RESULT CALLED TO, READ BACK BY AND VERIFIED WITH: PHARMD M MICCIA [id number] AT 911 BY CM    Staphylococcus aureus (BCID) NOT DETECTED NOT DETECTED Final   Methicillin resistance NOT DETECTED NOT DETECTED Final   Streptococcus species NOT DETECTED NOT DETECTED Final   Streptococcus agalactiae NOT DETECTED NOT DETECTED Final   Streptococcus pneumoniae NOT DETECTED NOT DETECTED Final   Streptococcus pyogenes NOT DETECTED NOT DETECTED Final   Acinetobacter baumannii NOT DETECTED NOT DETECTED Final   Enterobacteriaceae species NOT DETECTED NOT DETECTED Final   Enterobacter cloacae complex NOT DETECTED NOT DETECTED Final   Escherichia coli NOT DETECTED NOT DETECTED Final   Klebsiella oxytoca NOT DETECTED NOT DETECTED Final   Klebsiella pneumoniae NOT DETECTED NOT DETECTED Final   Proteus species NOT DETECTED NOT DETECTED Final   Serratia marcescens NOT DETECTED NOT DETECTED Final   Haemophilus influenzae NOT DETECTED NOT DETECTED Final   Neisseria meningitidis NOT DETECTED NOT DETECTED Final   Pseudomonas aeruginosa NOT DETECTED NOT DETECTED Final   Candida albicans NOT DETECTED NOT DETECTED Final   Candida glabrata NOT DETECTED NOT DETECTED Final   Candida krusei NOT DETECTED NOT DETECTED Final   Candida parapsilosis NOT DETECTED NOT DETECTED Final   Candida tropicalis NOT DETECTED NOT DETECTED Final    Comment: Performed at [facility], [address] [place], [place] [address]  SARS Coronavirus 2 by RT PCR (hospital order, performed in [facility] hospital lab) Nasopharyngeal Nasopharyngeal Swab     Status: None   Collection Time: 06/19/19  1:26 PM   Specimen: Nasopharyngeal Swab  Result Value Ref Range Status   SARS Coronavirus 2 NEGATIVE NEGATIVE Final    Comment: (NOTE) SARS-CoV-2 target nucleic acids are NOT DETECTED.  The SARS-CoV-2 RNA is generally detectable in upper and lower respiratory specimens during the acute phase of infection. The lowest concentration of SARS-CoV-2 viral copies this assay can detect is 250 copies / mL. A negative result does not preclude SARS-CoV-2 infection and should not be used as the sole basis for treatment or other patient management decisions.  A negative result may occur with improper specimen collection / handling, submission of specimen other than nasopharyngeal swab, presence of viral mutation(s) within the areas targeted by this assay, and inadequate number of viral copies (<250 copies / mL). A negative result must be combined with clinical observations, patient history, and epidemiological information.  Fact Sheet for Patients:   BoilerBrush.com.cyhttps://www.fda.gov/media/136312/download  Fact Sheet for Healthcare Providers: https://pope.com/https://www.fda.gov/media/136313/download  This test is not yet approved or  cleared by the [place] FDA and has been authorized for detection and/or diagnosis of SARS-CoV-2 by FDA under an Emergency Use Authorization (EUA).  This EUA will remain in effect (meaning this test can be used) for the duration of the COVID-19 declaration under Section  564(b)(1) of the Act, 21 U.S.C. section 360bbb-3(b)(1), unless the authorization is terminated or revoked sooner.  Performed at [facility], [place] [address] [place], [place] [address]   Blood culture (routine x 2)     Status: None (Preliminary result)   Collection Time: 06/19/19  1:26 PM   Specimen: BLOOD LEFT FOREARM    Result Value Ref Range Status   Specimen Description BLOOD LEFT FOREARM  Final   Special Requests   Final    BOTTLES DRAWN AEROBIC AND ANAEROBIC Blood Culture adequate volume   Culture   Final    NO GROWTH 3 DAYS Performed at [facility], [place] [address] [place], [place] [address]    Report Status PENDING  Incomplete  MRSA PCR Screening     Status: None   Collection Time: 06/20/19  4:19 AM   Specimen: Nasal Mucosa; Nasopharyngeal  Result Value Ref Range Status   MRSA by PCR NEGATIVE NEGATIVE Final    Comment:        The GeneXpert MRSA Assay (FDA approved for NASAL specimens only), is one component of a comprehensive MRSA colonization surveillance program. It is not intended to diagnose MRSA infection nor to guide or monitor treatment for MRSA infections. Performed at [facility], [place] [address] [place], [place] [address]          Radiology Studies: No results found.      Scheduled Meds:  amLODipine  10 mg Oral Daily   carBAMazepine  300 mg Oral BID   chlorhexidine  15 mL Mouth Rinse BID   clonazePAM  0.25 mg Oral BID   enoxaparin (LOVENOX) injection  40 mg Subcutaneous Q24H   hydrALAZINE  50 mg Oral TID   isosorbide mononitrate  30 mg Oral QHS   lacosamide  300 mg Oral BID   levETIRAcetam  1,500 mg Oral BID   magnesium oxide  400 mg Oral Daily   mouth rinse  15 mL Mouth Rinse q12n4p   polyvinyl alcohol  2 drop Left Eye BID   saccharomyces boulardii  250 mg Oral Daily   sodium chloride flush  10-40 mL Intracatheter Q12H   valproic acid  750 mg Oral Q8H   Continuous Infusions:  ampicillin-sulbactam (UNASYN) IV Stopped (06/22/19 0556)     LOS: 3 days    Time spent: 30 minutes.    Barb Merino, MD [facility] Pager [phone]

## 2019-06-23 LAB — BASIC METABOLIC PANEL
Anion gap: 8 (ref 5–15)
BUN: 5 mg/dL — ABNORMAL LOW (ref 8–23)
CO2: 28 mmol/L (ref 22–32)
Calcium: 8.9 mg/dL (ref 8.9–10.3)
Chloride: 105 mmol/L (ref 98–111)
Creatinine, Ser: 0.56 mg/dL — ABNORMAL LOW (ref 0.61–1.24)
GFR calc Af Amer: >60 mL/min (ref >60)
GFR calc non Af Amer: >60 mL/min (ref >60)
Glucose, Bld: 113 mg/dL — ABNORMAL HIGH (ref 70–99)
Potassium: 3.9 mmol/L (ref 3.5–5.1)
Sodium: 141 mmol/L (ref 135–145)

## 2019-06-23 LAB — CULTURE, BLOOD (ROUTINE X 2): Special Requests: ADEQUATE

## 2019-06-23 MED ORDER — ACETAMINOPHEN 325 MG PO TABS
650.0000 mg | ORAL_TABLET | Freq: Four times a day (QID) | ORAL | Status: DC
  Administered 2019-06-23 – 2019-06-24 (×4): 650 mg via ORAL
  Filled 2019-06-23 (×4): qty 2

## 2019-06-23 NOTE — Plan of Care (Signed)
  Problem: Education: Goal: Knowledge of General Education information will improve Description: Including pain rating scale, medication(s)/side effects and non-pharmacologic comfort measures Outcome: Progressing   Problem: Activity: Goal: Risk for activity intolerance will decrease Outcome: Progressing   Problem: Nutrition: Goal: Adequate nutrition will be maintained Outcome: Progressing   

## 2019-06-23 NOTE — Progress Notes (Signed)
PROGRESS NOTE    KUYPER COCKETT  WUJ:[id number] DOB: [birth year] DOA: 06/19/2019 PCP: Mirna Mires, MD    Brief Narrative:  69 year old man with history of CVA, advanced dementia, epilepsy and hypertension was brought to the hospital from long-term nursing home.  He lives in [place].  Does have history of seizure and he is on multiple medications.  Reportedly had multiple episodes of seizure at [facility], given Ativan with no improvement.  EMS gave him additional Versed and she is very stopped.  In the emergency room, hemodynamically stable.  Difficult to assess neurological status because of advanced dementia and postictal state.  Admitted with uncontrolled seizure.  Patient also found to be having elevated white cell count, elevated lactic acid, elevated procalcitonin and right lower lobe pneumonia.   Assessment & Plan:   Principal Problem:   Sepsis (HCC) Active Problems:   Acute encephalopathy   Seizures (HCC)   Aspiration pneumonia (HCC)   Metabolic acidosis with increased anion gap and accumulation of organic acids   Lactic acidosis   AKI (acute kidney injury) (HCC)  Sepsis present on admission: Resolving. Aspiration pneumonia, on Unasyn.  Continue. ESBL E. coli UTI: Presented with altered mental status and seizure, will treat as symptomatic UTI.  Fosfomycin 1 dose given on 6/13. Clinically stabilizing.  Recurrent seizure: Stat EEG ruled out status epilepticus.  Underwent LTM EEG monitoring and followed by neurology.  No active seizures but evidence of seizure activities. Prior to admission medications, Keppra 1500 mg twice a day, on IV.  Changed to oral today. Tegretol 300 mg twice a day, changed to solution by NG tube.  Tegretol level is less than 2.  Changed to oral today. clonazepam 0.25 mg 2 times a day, continued Vimpat 300 mg twice a day, on IV.  Changed to oral today. Valproic acid 750 mg twice a day.  Valproic acid level less than 10. Subtherapeutic Tegretol  and Depakote levels.  Probably due to poor oral intake. Followed by neurology.  Now taking oral medications and diet.  Acute metabolic encephalopathy with history of underlying advanced dementia and debility: Suspect likely due to seizure episodes.  CT head negative.  Normalizing.  Back to baseline as per family.  Lactic acidosis: Probably due to aspiration pneumonia as well as from seizure.  Treated with IV fluids with improvement.  Discontinue IV fluids.  Hypertensive urgency: Blood pressure initially elevated more than 200.  Home medications resumed.  On hydralazine as needed. Blood pressure elevated in the morning, resolved with the morning medication administration.  Acute kidney injury: Resolved.  Hypokalemia: Replaced.  Goal of care:  Patient with advanced dementia.  Mentation is improving back to baseline.  Followed by palliative care.  Started on dysphagia diet. Will monitor for oral intake and able to tolerate antiseizure medications today in the hospital. Discharge back to long-term nursing home if remains stable by tomorrow.  DVT prophylaxis: Lovenox subcu Code Status: DNR/DNI Family Communication: Patient's wife at the bedside. Disposition Plan: Status is: Inpatient  Remains inpatient appropriate because:Inpatient level of care appropriate due to severity of illness   Dispo: The patient is from: SNF              Anticipated d/c is to: SNF              Anticipated d/c date is: Anticipate 6/16              Patient currently is not medically stable to d/c.  1 to establish adequate  intake.         Consultants:   Neurology  Palliative medicine  Procedures:   EEG  Antimicrobials:  Antibiotics Given (last 72 hours)    Date/Time Action Medication Dose Rate   06/20/19 1425 New Bag/Given   Ampicillin-Sulbactam (UNASYN) 3 g in sodium chloride 0.9 % 100 mL IVPB 3 g 200 mL/hr   06/20/19 2159 New Bag/Given   Ampicillin-Sulbactam (UNASYN) 3 g in sodium chloride  0.9 % 100 mL IVPB 3 g 200 mL/hr   06/21/19 0459 New Bag/Given   Ampicillin-Sulbactam (UNASYN) 3 g in sodium chloride 0.9 % 100 mL IVPB 3 g 200 mL/hr   06/21/19 1434 Given   fosfomycin (MONUROL) packet 3 g 3 g    06/21/19 1434 New Bag/Given   Ampicillin-Sulbactam (UNASYN) 3 g in sodium chloride 0.9 % 100 mL IVPB 3 g 200 mL/hr   06/21/19 2220 New Bag/Given   Ampicillin-Sulbactam (UNASYN) 3 g in sodium chloride 0.9 % 100 mL IVPB 3 g 200 mL/hr   06/22/19 0520 New Bag/Given   Ampicillin-Sulbactam (UNASYN) 3 g in sodium chloride 0.9 % 100 mL IVPB 3 g 200 mL/hr   06/22/19 1403 New Bag/Given   Ampicillin-Sulbactam (UNASYN) 3 g in sodium chloride 0.9 % 100 mL IVPB 3 g 200 mL/hr   06/22/19 2127 New Bag/Given   Ampicillin-Sulbactam (UNASYN) 3 g in sodium chloride 0.9 % 100 mL IVPB 3 g 200 mL/hr   06/23/19 0528 New Bag/Given   Ampicillin-Sulbactam (UNASYN) 3 g in sodium chloride 0.9 % 100 mL IVPB 3 g 200 mL/hr         Subjective: Patient seen and examined.  No overnight events.  He complains of bilateral knee pain because of contractures.  Wife was at the bedside.  Patient is pleasant, communicating.  Forgetful. Wife was wondering whether we can remove his cushion boots.  Objective: Vitals:   06/23/19 0726 06/23/19 1100 06/23/19 1126 06/23/19 1242  BP: (!) 174/86 (!) 152/88 (!) 159/83 (!) 149/83  Pulse: 87 87 88 88  Resp: 17 18 20 20   Temp: 98.9 F (37.2 C)  99.2 F (37.3 C)   TempSrc: Oral  Axillary   SpO2: 97% 100% 96% 97%  Weight:      Height:        Intake/Output Summary (Last 24 hours) at 06/23/2019 1323 Last data filed at 06/23/2019 1200 Gross per 24 hour  Intake 1660 ml  Output 1350 ml  Net 310 ml   Filed Weights   06/19/19 1244 06/19/19 1956  Weight: 61.4 kg 61.5 kg    Examination: Physical Exam Constitutional:      Comments: Cachectic, chronically sick looking not in any distress.  Pleasantly confused.  Awake and alert but not oriented.  HENT:     Head:  Normocephalic.  Cardiovascular:     Rate and Rhythm: Normal rate and regular rhythm.  Pulmonary:     Breath sounds: Normal breath sounds.  Neurological:     Comments: No focal deficit.  Moving all extremities.  Pleasant but not oriented.       Data Reviewed: I have personally reviewed following labs and imaging studies  CBC: Recent Labs  Lab 06/19/19 1254 06/20/19 0336 06/21/19 1129  WBC 25.0* 14.2* 10.7*  NEUTROABS 22.4*  --  7.1  HGB 15.9 13.0 13.1  HCT 49.0 38.4* 39.1  MCV 100.6* 97.0 96.5  PLT 378 285 263   Basic Metabolic Panel: Recent Labs  Lab 06/19/19 1430 06/20/19 0336 06/21/19 1129 06/22/19  1100 06/23/19 0502  NA 141 141 141 142 141  K 3.1* 3.1* 3.3* 3.7 3.9  CL 103 107 104 106 105  CO2 14* 26 26 29 28   GLUCOSE 151* 87 93 102* 113*  BUN 12 7* <5* <5* 5*  CREATININE 1.22 0.73 0.61 0.66 0.56*  CALCIUM 9.6 8.6* 8.8* 8.7* 8.9  MG  --  1.9 1.6*  --   --   PHOS  --   --  2.7  --   --    GFR: Estimated Creatinine Clearance: 74.7 mL/min (A) (by C-G formula based on SCr of 0.56 mg/dL (L)). Liver Function Tests: Recent Labs  Lab 06/19/19 1430  AST 39  ALT 26  ALKPHOS 101  BILITOT 0.5  PROT 7.9  ALBUMIN 3.5   No results for input(s): LIPASE, AMYLASE in the last 168 hours. No results for input(s): AMMONIA in the last 168 hours. Coagulation Profile: No results for input(s): INR, PROTIME in the last 168 hours. Cardiac Enzymes: No results for input(s): CKTOTAL, CKMB, CKMBINDEX, TROPONINI in the last 168 hours. BNP (last 3 results) No results for input(s): PROBNP in the last 8760 hours. HbA1C: No results for input(s): HGBA1C in the last 72 hours. CBG: No results for input(s): GLUCAP in the last 168 hours. Lipid Profile: No results for input(s): CHOL, HDL, LDLCALC, TRIG, CHOLHDL, LDLDIRECT in the last 72 hours. Thyroid Function Tests: No results for input(s): TSH, T4TOTAL, FREET4, T3FREE, THYROIDAB in the last 72 hours. Anemia Panel: No results for  input(s): VITAMINB12, FOLATE, FERRITIN, TIBC, IRON, RETICCTPCT in the last 72 hours. Sepsis Labs: Recent Labs  Lab 06/19/19 1850 06/19/19 1943 06/20/19 0015 06/20/19 0336  PROCALCITON  --  4.17  --   --   LATICACIDVEN 2.5* 2.3* 2.5* 1.3    Recent Results (from the past 240 hour(s))  Urine culture     Status: Abnormal   Collection Time: 06/19/19 12:54 PM   Specimen: Urine, Random  Result Value Ref Range Status   Specimen Description URINE, RANDOM  Final   Special Requests   Final    NONE Performed at [facility], [address] [place], [place] [address]    Culture (A)  Final    >=100,000 COLONIES/mL ESCHERICHIA COLI Confirmed Extended Spectrum Beta-Lactamase Producer (ESBL).  In bloodstream infections from ESBL organisms, carbapenems are preferred over piperacillin/tazobactam. They are shown to have a lower risk of mortality.    Report Status 06/21/2019 FINAL  Final   Organism ID, Bacteria ESCHERICHIA COLI (A)  Final      Susceptibility   Escherichia coli - MIC*    AMPICILLIN >=32 RESISTANT Resistant     CEFAZOLIN >=64 RESISTANT Resistant     CEFTRIAXONE >=64 RESISTANT Resistant     CIPROFLOXACIN <=0.25 SENSITIVE Sensitive     GENTAMICIN >=16 RESISTANT Resistant     IMIPENEM <=0.25 SENSITIVE Sensitive     NITROFURANTOIN <=16 SENSITIVE Sensitive     TRIMETH/SULFA >=320 RESISTANT Resistant     AMPICILLIN/SULBACTAM 16 INTERMEDIATE Intermediate     PIP/TAZO <=4 SENSITIVE Sensitive     * >=100,000 COLONIES/mL ESCHERICHIA COLI  Blood culture (routine x 2)     Status: Abnormal   Collection Time: 06/19/19 12:54 PM   Specimen: BLOOD  Result Value Ref Range Status   Specimen Description BLOOD SITE NOT SPECIFIED  Final   Special Requests   Final    BOTTLES DRAWN AEROBIC AND ANAEROBIC Blood Culture adequate volume   Culture  Setup Time  Final    GRAM POSITIVE COCCI IN CLUSTERS ANAEROBIC BOTTLE ONLY CRITICAL RESULT CALLED TO, READ BACK BY AND VERIFIED WITH: PHARMD M  MICCIA [id number] AT 910 AM BY CM    Culture (A)  Final    STAPHYLOCOCCUS SPECIES (COAGULASE NEGATIVE) THE SIGNIFICANCE OF ISOLATING THIS ORGANISM FROM A SINGLE SET OF BLOOD CULTURES WHEN MULTIPLE SETS ARE DRAWN IS UNCERTAIN. PLEASE NOTIFY THE MICROBIOLOGY DEPARTMENT WITHIN ONE WEEK IF SPECIATION AND SENSITIVITIES ARE REQUIRED. Performed at [facility], [address] [place], [place] [address]    Report Status 06/23/2019 FINAL  Final  Blood Culture ID Panel (Reflexed)     Status: Abnormal   Collection Time: 06/19/19 12:54 PM  Result Value Ref Range Status   Enterococcus species NOT DETECTED NOT DETECTED Final   Listeria monocytogenes NOT DETECTED NOT DETECTED Final   Staphylococcus species DETECTED (A) NOT DETECTED Final    Comment: Methicillin (oxacillin) susceptible coagulase negative staphylococcus. Possible blood culture contaminant (unless isolated from more than one blood culture draw or clinical case suggests pathogenicity). No antibiotic treatment is indicated for blood  culture contaminants. CRITICAL RESULT CALLED TO, READ BACK BY AND VERIFIED WITH: PHARMD M MICCIA [id number] AT 911 BY CM    Staphylococcus aureus (BCID) NOT DETECTED NOT DETECTED Final   Methicillin resistance NOT DETECTED NOT DETECTED Final   Streptococcus species NOT DETECTED NOT DETECTED Final   Streptococcus agalactiae NOT DETECTED NOT DETECTED Final   Streptococcus pneumoniae NOT DETECTED NOT DETECTED Final   Streptococcus pyogenes NOT DETECTED NOT DETECTED Final   Acinetobacter baumannii NOT DETECTED NOT DETECTED Final   Enterobacteriaceae species NOT DETECTED NOT DETECTED Final   Enterobacter cloacae complex NOT DETECTED NOT DETECTED Final   Escherichia coli NOT DETECTED NOT DETECTED Final   Klebsiella oxytoca NOT DETECTED NOT DETECTED Final   Klebsiella pneumoniae NOT DETECTED NOT DETECTED Final   Proteus species NOT DETECTED NOT DETECTED Final   Serratia marcescens NOT DETECTED NOT DETECTED Final    Haemophilus influenzae NOT DETECTED NOT DETECTED Final   Neisseria meningitidis NOT DETECTED NOT DETECTED Final   Pseudomonas aeruginosa NOT DETECTED NOT DETECTED Final   Candida albicans NOT DETECTED NOT DETECTED Final   Candida glabrata NOT DETECTED NOT DETECTED Final   Candida krusei NOT DETECTED NOT DETECTED Final   Candida parapsilosis NOT DETECTED NOT DETECTED Final   Candida tropicalis NOT DETECTED NOT DETECTED Final    Comment: Performed at [facility], [address], [place], [place] [address]  SARS Coronavirus 2 by RT PCR (hospital order, performed in [facility] hospital lab) Nasopharyngeal Nasopharyngeal Swab     Status: None   Collection Time: 06/19/19  1:26 PM   Specimen: Nasopharyngeal Swab  Result Value Ref Range Status   SARS Coronavirus 2 NEGATIVE NEGATIVE Final    Comment: (NOTE) SARS-CoV-2 target nucleic acids are NOT DETECTED.  The SARS-CoV-2 RNA is generally detectable in upper and lower respiratory specimens during the acute phase of infection. The lowest concentration of SARS-CoV-2 viral copies this assay can detect is 250 copies / mL. A negative result does not preclude SARS-CoV-2 infection and should not be used as the sole basis for treatment or other patient management decisions.  A negative result may occur with improper specimen collection / handling, submission of specimen other than nasopharyngeal swab, presence of viral mutation(s) within the areas targeted by this assay, and inadequate number of viral copies (<250 copies / mL). A negative result must be combined with clinical observations,  patient history, and epidemiological information.  Fact Sheet for Patients:   BoilerBrush.com.cy  Fact Sheet for Healthcare Providers: https://pope.com/  This test is not yet approved or  cleared by the [place] FDA and has been authorized for detection and/or diagnosis of SARS-CoV-2 by FDA under an  Emergency Use Authorization (EUA).  This EUA will remain in effect (meaning this test can be used) for the duration of the COVID-19 declaration under Section 564(b)(1) of the Act, 21 U.S.C. section 360bbb-3(b)(1), unless the authorization is terminated or revoked sooner.  Performed at [facility], [address], [place], [place] [address]   Blood culture (routine x 2)     Status: None (Preliminary result)   Collection Time: 06/19/19  1:26 PM   Specimen: BLOOD LEFT FOREARM  Result Value Ref Range Status   Specimen Description BLOOD LEFT FOREARM  Final   Special Requests   Final    BOTTLES DRAWN AEROBIC AND ANAEROBIC Blood Culture adequate volume   Culture   Final    NO GROWTH 4 DAYS Performed at [facility], [address] [place], [place] [address]    Report Status PENDING  Incomplete  MRSA PCR Screening     Status: None   Collection Time: 06/20/19  4:19 AM   Specimen: Nasal Mucosa; Nasopharyngeal  Result Value Ref Range Status   MRSA by PCR NEGATIVE NEGATIVE Final    Comment:        The GeneXpert MRSA Assay (FDA approved for NASAL specimens only), is one component of a comprehensive MRSA colonization surveillance program. It is not intended to diagnose MRSA infection nor to guide or monitor treatment for MRSA infections. Performed at [facility], [address], [place], [place] [address]          Radiology Studies: No results found.      Scheduled Meds: . acetaminophen  650 mg Oral Q6H  . amLODipine  10 mg Oral Daily  . carBAMazepine  300 mg Oral BID  . chlorhexidine  15 mL Mouth Rinse BID  . clonazePAM  0.25 mg Oral BID  . enoxaparin (LOVENOX) injection  40 mg Subcutaneous Q24H  . hydrALAZINE  50 mg Oral TID  . isosorbide mononitrate  30 mg Oral QHS  . lacosamide  300 mg Oral BID  . levETIRAcetam  1,500 mg Oral BID  . magnesium oxide  400 mg Oral Daily  . mouth rinse  15 mL Mouth Rinse q12n4p  . polyvinyl alcohol  2 drop Left  Eye BID  . saccharomyces boulardii  250 mg Oral Daily  . sodium chloride flush  10-40 mL Intracatheter Q12H  . valproic acid  750 mg Oral Q8H   Continuous Infusions: . ampicillin-sulbactam (UNASYN) IV 3 g (06/23/19 0528)     LOS: 4 days    Time spent: 30 minutes.    Dorcas Carrow, MD [facility] Pager [phone]

## 2019-06-24 LAB — CULTURE, BLOOD (ROUTINE X 2)
Culture: NO GROWTH
Special Requests: ADEQUATE

## 2019-06-24 MED ORDER — AMOXICILLIN-POT CLAVULANATE 875-125 MG PO TABS: 1.0000 | ORAL_TABLET | Freq: Two times a day (BID) | ORAL | 0 refills | Status: AC

## 2019-06-24 MED ORDER — ACETAMINOPHEN 325 MG PO TABS: 650.0000 mg | ORAL_TABLET | ORAL | Status: DC | PRN

## 2019-06-24 NOTE — Progress Notes (Signed)
Speech Language Pathology Treatment: Dysphagia  Patient Details Name: Lee [name] MRN: [id number] DOB: [birth year] Today's Date: 06/24/2019 Time: 4034-7425 SLP Time Calculation (min) (ACUTE ONLY): 15 min  Assessment / Plan / Recommendation Clinical Impression  Mr. Bendon was upright in bed, requiring repositioning upon SLP arrival. Pt and RN reported no difficulty with breakfast. Wife present during evaluation, reporting that patient is on purees at SNF baseline. He was seen with purees and thin liquids as well as PO medications with no s/s aspiration. He was noted with prolonged A/P transit/mild oral hold, but swallows well once cued. Pt continues to require total assist for feeding. Continue recommendations for purees/thin liquids, meds crushed in puree, 1:1 A for feeding. No further skilled ST indicated on acute level.      HPI: Lee [name] is a 69 y.o. male with medical history significant of CVA, seizure order, advanced dementia, COPD, aspiration pneumonia, and prior alcohol abuse who presented from [facility] for seizures. Pt has a hx of dysphagia with most recent recommendation for Dysphagia 3 solids/thin liquids in 05/21.       SLP Plan  Discharge SLP treatment due to (comment);All goals met       Recommendations  Diet recommendations: Dysphagia 1 (puree);Thin liquid Medication Administration: Crushed with puree Supervision: Staff to assist with self feeding Compensations: Minimize environmental distractions;Slow rate;Small sips/bites Postural Changes and/or Swallow Maneuvers: Seated upright 90 degrees                Oral Care Recommendations: Oral care BID;Staff/trained caregiver to provide oral care Follow up Recommendations: Skilled Nursing facility SLP Visit Diagnosis: Dysphagia, unspecified (R13.10) Plan: Discharge SLP treatment due to (comment);All goals met                      Najee Manninen P. Brittanya Winburn, M.S., CCC-SLP Speech-Language  Pathologist Acute Rehabilitation Services Pager: [phone]  Susanne Borders Kimbree Casanas 06/24/2019, 9:53 AM

## 2019-06-24 NOTE — TOC Transition Note (Signed)
Transition of Care [facility]) - CM/SW Discharge Note   Patient Details  Name: Lee [name] MRN: [id number] Date of Birth: March 17, 1950  Transition of Care [facility]) CM/SW Contact:  Baldemar Lenis, LCSW Phone Number: 06/24/2019, 11:45 AM   Clinical Narrative:   Nurse to call report to [phone], Room 202A.   Transport set for 1:30 PM, per wife request to allow patient to eat lunch and get washed up.   CSW sent referral to [facility] for outpatient palliative.     Final next level of care: Skilled Nursing Facility Barriers to Discharge: Barriers Resolved   Patient Goals and CMS Choice Patient states their goals for this hospitalization and ongoing recovery are:: To return back to [facility] per patient's wife. CMS Medicare.gov Compare Post Acute Care list provided to:: Patient Represenative (must comment) Choice offered to / list presented to : Spouse  Discharge Placement              Patient chooses bed at: [facility] Patient to be transferred to facility by: [facility] Name of family member notified: [name] Patient and family notified of of transfer: 06/24/19  Discharge Plan and Services In-house Referral: Clinical Social Work   Post Acute Care Choice: Skilled Nursing Facility            DME Agency: NA       HH Arranged: NA          Social Determinants of Health (SDOH) Interventions     Readmission Risk Interventions Readmission Risk Prevention Plan 06/22/2019  Transportation Screening Complete  PCP or Specialist Appt within 3-5 Days Complete  HRI or Home Care Consult Complete  Social Work Consult for Recovery Care Planning/Counseling Complete  Palliative Care Screening Complete  Medication Review Ocean[facility]) Referral to Pharmacy  Some recent data might be hidden

## 2019-06-24 NOTE — Discharge Summary (Signed)
Physician Discharge Summary  Lee [name] JXB:[id number] DOB: 07-14-1950 DOA: 06/19/2019  PCP: Iona Beard, MD  Admit date: 06/19/2019 Discharge date: 06/24/2019  Admitted From: Long-term nursing home Disposition: Long-term nursing home  Recommendations for Outpatient Follow-up:  1. Follow up with PCP in 1-2 weeks   Discharge Condition: Stable CODE STATUS: DNR/DNI Diet recommendation: Low-salt diet, pure with thin liquid  Discharge summary: 69 year old gentleman with history of stroke, advanced dementia, epilepsy on multiple antiseizure medications and hypertension brought to the hospital with multiple episodes of seizure at [facility], given Ativan with no improvement.  EMS had to give him additional doses of benzodiazepine that he stopped the seizure.  Apparently, last few days he was not able to take some of the seizure medications.  He was admitted to the hospital with uncontrolled seizures.  He was also found to have elevated white cell count, elevated lactic acid and procalcitonin as well as right lower lobe pneumonia.  Urine with ESBL E. coli.  Treated for following conditions.  Plan of care as below:  #1 sepsis present on admission:  Improved.  Right lower lobe pneumonia, cultures negative.  Received Unasyn for 4 days, will continue Augmentin for 3 more days.  Chronic dysphagia, seen by speech therapy and remains on pure diet with thin liquids. Also found to have ESBL E. coli.  Treated with 1 dose of fosfomycin.  No urinary symptoms.  #2 recurrent seizure: Stat EEG ruled out status epilepticus.  Underwent LTM EEG monitoring and followed by neurology.  No active seizures but evidence of seizure activities. Prior to admission medications, Keppra 1500 mg twice a day, on IV.  Changed to oral today. Tegretol 300 mg twice a day, changed to solution by NG tube.  Tegretol level is less than 2.  Changed to oral today. clonazepam 0.25 mg 2 times a day, continued Vimpat 300 mg twice  a day, on IV.  Changed to oral today. Valproic acid 750 mg twice a day.  Valproic acid level less than 10. Subtherapeutic Tegretol and Depakote levels.  Probably due to poor oral intake. Patient was started on all oral seizure medications with improvement.  Please ensure that he is able to take all seizure medications supervised.  No change in long-term medications were done.  #3 Acute metabolic encephalopathy with history of underlying advanced dementia and debility: Suspect likely due to seizure episodes.  CT head negative.  Normalizing.  Back to baseline as per family.  #4 hypertensive urgency: Blood pressure initially elevated more than 200.  Home medications resumed.  On hydralazine as needed.  Continue his medications including amlodipine, nitrates and hydralazine.  Also on as needed hydralazine to keep blood pressures less than 130 systolic.  #5 acute kidney injury: Resolved.  #6 hypokalemia: Replaced.  Improved.  #7 sacral decubitus ulcer stage II present on admission: Local wound care.  Barrier dressing.  Goal of care:  Patient with advanced dementia.  Mentation is improving back to baseline.  Followed by palliative care.  Started on dysphagia diet.  Tolerating.  Tolerating seizure medications and antibiotics as well as oral medications. Transferred back to skilled nursing facility today.   Discharge Diagnoses:  Principal Problem:   Sepsis ([place]) Active Problems:   Acute encephalopathy   Seizures (HCC)   Aspiration pneumonia (HCC)   Metabolic acidosis with increased anion gap and accumulation of organic acids   Lactic acidosis   AKI (acute kidney injury) [facility])    Discharge Instructions  Discharge Instructions    Diet -  low sodium heart healthy   Complete by: As directed    Discharge instructions   Complete by: As directed    Ensure proper seizure medicine intake   Discharge wound care:   Complete by: As directed    Barrier dressing on the buttock   Increase  activity slowly   Complete by: As directed      Allergies as of 06/24/2019      Reactions   Orange Juice [orange Oil] Diarrhea   Vicodin [hydrocodone-acetaminophen] Other (See Comments)   "triggered seizure both here and at home when he tried to take it" (01/22/2012)   Penicillins Nausea And Vomiting   Tolerates Zosyn and Unasyn. Has patient had a PCN reaction causing immediate rash, facial/tongue/throat swelling, SOB or lightheadedness with hypotension: no Has patient had a PCN reaction causing severe rash involving mucus membranes or skin necrosis: no Has patient had a PCN reaction that required hospitalization: no Has patient had a PCN reaction occurring within the last 10 years: no If all of the above answers are "NO", then may proceed with Cephalosporin u   Oxycodone    Causes Seizures   Vicodin [hydrocodone-acetaminophen]       Medication List    TAKE these medications   acetaminophen 325 MG tablet Commonly known as: TYLENOL Take 2 tablets (650 mg total) by mouth every 4 (four) hours as needed for mild pain, moderate pain or headache. What changed:   when to take this  reasons to take this   albuterol 108 (90 Base) MCG/ACT inhaler Commonly known as: VENTOLIN HFA Inhale 2 puffs into the lungs every 6 (six) hours as needed for wheezing or shortness of breath.   amLODipine 10 MG tablet Commonly known as: NORVASC Take 1 tablet (10 mg total) by mouth daily.   amoxicillin-clavulanate 875-125 MG tablet Commonly known as: Augmentin Take 1 tablet by mouth 2 (two) times daily for 3 days.   ARTIFICIAL TEARS PF OP Place 2 drops into the left eye in the morning and at bedtime.   aspirin 81 MG chewable tablet Chew 81 mg by mouth daily.   B-complex with vitamin C tablet Take 1 tablet by mouth daily.   carBAMazepine 100 MG/5ML suspension Commonly known as: TEGRETOL Take 15 mLs (300 mg total) by mouth 2 (two) times daily.   cholecalciferol 1000 units tablet Commonly  known as: VITAMIN D Take 1,000 Units by mouth once a week. On Mondays.   clonazePAM 0.5 MG tablet Commonly known as: KLONOPIN Take 0.5 tablets (0.25 mg total) by mouth 2 (two) times daily. 2 pm and 7 pm   hydrALAZINE 25 MG tablet Commonly known as: APRESOLINE Take 25 mg by mouth daily as needed (SBP > 195).   hydrALAZINE 50 MG tablet Commonly known as: APRESOLINE Take 50 mg by mouth 3 (three) times daily.   hydroxypropyl methylcellulose / hypromellose 2.5 % ophthalmic solution Commonly known as: ISOPTO TEARS / GONIOVISC Place 2 drops into the left eye in the morning and at bedtime.   isosorbide mononitrate 30 MG 24 hr tablet Commonly known as: IMDUR Take 30 mg by mouth at bedtime.   levETIRAcetam 100 MG/ML solution Commonly known as: KEPPRA Take 15 mLs (1,500 mg total) by mouth 2 (two) times daily.   magnesium oxide 400 MG tablet Commonly known as: MAG-OX Take 400 mg by mouth daily.   multivitamin with minerals Tabs tablet Take 1 tablet by mouth daily.   NON FORMULARY Apply 1 mL topically in the morning, at noon,  and at bedtime. ABH gel (Ativan 1mg 12.5mg /Haldol 1mg ). Apply 18mL topically to forearm three times daily for mood disorder.   omeprazole 20 MG tablet Commonly known as: PRILOSEC OTC Take 20 mg by mouth daily.   polyethylene glycol 17 g packet Commonly known as: MIRALAX / GLYCOLAX Take 17 g by mouth at bedtime. Take 17 grams mix with 8OZ of apple juice for constipation.   saccharomyces boulardii 250 MG capsule Commonly known as: FLORASTOR Take 250 mg by mouth daily.   senna 8.6 MG Tabs tablet Commonly known as: SENOKOT Take 2 tablets by mouth at bedtime.   simvastatin 10 MG tablet Commonly known as: ZOCOR Take 10 mg by mouth at bedtime.   valproic acid 250 MG/5ML solution Commonly known as: DEPAKENE Take 15 mLs (750 mg total) by mouth every 8 (eight) hours. What changed: when to take this   Vimpat 150 MG Tabs Generic drug:  Lacosamide Take 300 mg by mouth in the morning and at bedtime.            Discharge Care Instructions  (From admission, onward)         Start     Ordered   06/24/19 0000  Discharge wound care:       Comments: Barrier dressing on the buttock   06/24/19 1032          Allergies  Allergen Reactions  . Orange Juice [Orange Oil] Diarrhea  . Vicodin [Hydrocodone-Acetaminophen] Other (See Comments)    "triggered seizure both here and at home when he tried to take it" (01/22/2012)  . Penicillins Nausea And Vomiting    Tolerates Zosyn and Unasyn. Has patient had a PCN reaction causing immediate rash, facial/tongue/throat swelling, SOB or lightheadedness with hypotension: no Has patient had a PCN reaction causing severe rash involving mucus membranes or skin necrosis: no Has patient had a PCN reaction that required hospitalization: no Has patient had a PCN reaction occurring within the last 10 years: no If all of the above answers are "NO", then may proceed with Cephalosporin u  . Oxycodone     Causes Seizures  . Vicodin [Hydrocodone-Acetaminophen]     Consultations:  Neurology   Procedures/Studies: EEG  Result Date: 06/19/2019 01/24/2012, MD     06/19/2019  2:11 PM Patient Name: Lee [name] MRN: [id number] Epilepsy Attending: Tessie Eke Referring Physician/Provider: Dr. [id number] Date: 06/19/2019 Duration: 23.30 mins Patient history: 69 year old male with history of epilepsy presented with breakthrough seizures, continues to be altered.  EEG to evaluate for seizures, subclinical status. Level of alertness: Lethargic AEDs during EEG study: Ativan, Keppra Technical aspects: This EEG study was done with scalp electrodes positioned according to the 10-20 International system of electrode placement. Electrical activity was acquired at a sampling rate of 500Hz  and reviewed with a high frequency filter of 70Hz  and a low frequency filter of 1Hz . EEG data were  recorded continuously and digitally stored. Description: No clear posterior dominant rhythm was seen.  EEG showed continuous generalized and lateralized left hemisphere polymorphic 3 to 6 Hz theta-delta slowing.  Hyperventilation and photic stimulation were not performed.   Of note, EEG was technically difficult due to significant myogenic/shivering artifact. ABNORMALITY -Continuous slow, generalized and lateralized left hemisphere IMPRESSION: This technically difficult study is suggestive of cortical dysfunction in left hemisphere likely secondary to underlying structural abnormality, postictal state. No seizures or definite epileptiform discharges were seen throughout the recording. 08/19/2019   CT Head Wo Contrast  Result  Date: 06/19/2019 CLINICAL DATA:  Headache, acute. EXAM: CT HEAD WITHOUT CONTRAST TECHNIQUE: Contiguous axial images were obtained from the base of the skull through the vertex without intravenous contrast. COMPARISON:  05/14/2019 FINDINGS: Brain: No evidence of acute infarction, hemorrhage, hydrocephalus, extra-axial collection or mass lesion/mass effect. Signs of atrophy and chronic microvascular ischemic change. Vascular: No hyperdense vessel or unexpected calcification. Skull: Normal. Negative for fracture or focal lesion. Sinuses/Orbits: Limited assessment of sinuses and orbits without acute process. No fluid level in the sinuses. Mildly motion limited exam. Other: Nasal airway in place. IMPRESSION: 1. No acute intracranial abnormality. 2. Signs of atrophy and chronic microvascular ischemic change. Electronically Signed   By: Donzetta KohutGeoffrey  Wile M.D.   On: 06/19/2019 15:10   DG Chest Portable 1 View  Result Date: 06/19/2019 CLINICAL DATA:  Altered mental status EXAM: PORTABLE CHEST 1 VIEW COMPARISON:  Multiple prior studies most recent comparison CT chest from May 14, 2019 FINDINGS: Cardiomediastinal contours and hilar structures are normal. Subtle added density at the RIGHT lung  base as compared to the LEFT particularly in the RIGHT middle lobe based on appearance of RIGHT heart border. No dense consolidation. No sign of pleural effusion on frontal radiograph. Visualized skeletal structures without acute process on limited assessment. IMPRESSION: Subtle added density at the RIGHT lung base particularly in the RIGHT middle lobe may represent residual airspace disease or recurrent process in the chest, continued follow-up is suggested to ensure resolution. Findings likely represent infection based on previous imaging. Electronically Signed   By: Donzetta KohutGeoffrey  Wile M.D.   On: 06/19/2019 13:33   Overnight EEG with video  Result Date: 06/20/2019 Charlsie QuestYadav, Priyanka O, MD     06/20/2019  5:43 PM Patient Name: Lee [name] MRN: [id number] Epilepsy Attending: Charlsie QuestPriyanka O Yadav Referring Physician/Provider: Dr. Lindie SprucePriyanka Yadav Duration: 06/19/2019 1457 to 06/20/2019 1642  Patient history: 69 year old male with history of epilepsy presented with breakthrough seizures, continues to be altered.  EEG to evaluate for seizures, subclinical status.  Level of alertness: Lethargic  AEDs during EEG study: Ativan, Keppra  Technical aspects: This EEG study was done with scalp electrodes positioned according to the 10-20 International system of electrode placement. Electrical activity was acquired at a sampling rate of 500Hz  and reviewed with a high frequency filter of 70Hz  and a low frequency filter of 1Hz . EEG data were recorded continuously and digitally stored.  Description: No clear posterior dominant rhythm was seen. EEG showed continuous generalized polymorphic 3 to 6 Hz theta-delta slowing. Sharp waves were seen independently in left and right temporal region.  Hyperventilation and photic stimulation were not performed.    ABNORMALITY - Continuous slow, generalized - Sharp waves, left and right temporal region  IMPRESSION: This study showed evidence of left and right temporal epileptogenicity as well  as moderate diffuse encephalopathy, non specific to etiology. No seizures were seen throughout the recording. Priyanka O Yadav    (Echo, Carotid, EGD, Colonoscopy, ERCP)    Subjective: Patient was seen and examined.  No overnight events.  Complains of being stiff in the bed, complains of bilateral knee pain.   Discharge Exam: Vitals:   06/24/19 0848 06/24/19 1027  BP: (!) 188/88 (!) 142/79  Pulse: 87 90  Resp: 18 (!) 25  Temp: 98.5 F (36.9 C) (!) 97.4 F (36.3 C)  SpO2: 96% 97%   Vitals:   06/23/19 2027 06/23/19 2327 06/24/19 0848 06/24/19 1027  BP:  128/80 (!) 188/88 (!) 142/79  Pulse:  82 87 90  Resp:  14 18 (!) 25  Temp: 98.7 F (37.1 C) 98 F (36.7 C) 98.5 F (36.9 C) (!) 97.4 F (36.3 C)  TempSrc: Oral Oral Oral Oral  SpO2:  94% 96% 97%  Weight:      Height:        General: Pt is alert, awake, not in acute distress, chronically sick looking.  Oriented x1.  Able to communicate and follow simple commands. Cardiovascular: RRR, S1/S2 +, no rubs, no gallops Respiratory: CTA bilaterally, no wheezing, no rhonchi Abdominal: Soft, NT, ND, bowel sounds + Extremities: Stage II sacral decubitus ulcer with barrier dressing. Contracted lower extremity, contracted bilateral knees.    The results of significant diagnostics from this hospitalization (including imaging, microbiology, ancillary and laboratory) are listed below for reference.     Microbiology: Recent Results (from the past 240 hour(s))  Urine culture     Status: Abnormal   Collection Time: 06/19/19 12:54 PM   Specimen: Urine, Random  Result Value Ref Range Status   Specimen Description URINE, RANDOM  Final   Special Requests   Final    NONE Performed at [facility], [address], [place], [place] [address]    Culture (A)  Final    >=100,000 COLONIES/mL ESCHERICHIA COLI Confirmed Extended Spectrum Beta-Lactamase Producer (ESBL).  In bloodstream infections from ESBL organisms, carbapenems are  preferred over piperacillin/tazobactam. They are shown to have a lower risk of mortality.    Report Status 06/21/2019 FINAL  Final   Organism ID, Bacteria ESCHERICHIA COLI (A)  Final      Susceptibility   Escherichia coli - MIC*    AMPICILLIN >=32 RESISTANT Resistant     CEFAZOLIN >=64 RESISTANT Resistant     CEFTRIAXONE >=64 RESISTANT Resistant     CIPROFLOXACIN <=0.25 SENSITIVE Sensitive     GENTAMICIN >=16 RESISTANT Resistant     IMIPENEM <=0.25 SENSITIVE Sensitive     NITROFURANTOIN <=16 SENSITIVE Sensitive     TRIMETH/SULFA >=320 RESISTANT Resistant     AMPICILLIN/SULBACTAM 16 INTERMEDIATE Intermediate     PIP/TAZO <=4 SENSITIVE Sensitive     * >=100,000 COLONIES/mL ESCHERICHIA COLI  Blood culture (routine x 2)     Status: Abnormal   Collection Time: 06/19/19 12:54 PM   Specimen: BLOOD  Result Value Ref Range Status   Specimen Description BLOOD SITE NOT SPECIFIED  Final   Special Requests   Final    BOTTLES DRAWN AEROBIC AND ANAEROBIC Blood Culture adequate volume   Culture  Setup Time   Final    GRAM POSITIVE COCCI IN CLUSTERS ANAEROBIC BOTTLE ONLY CRITICAL RESULT CALLED TO, READ BACK BY AND VERIFIED WITH: PHARMD M MICCIA [id number] AT 910 AM BY CM    Culture (A)  Final    STAPHYLOCOCCUS SPECIES (COAGULASE NEGATIVE) THE SIGNIFICANCE OF ISOLATING THIS ORGANISM FROM A SINGLE SET OF BLOOD CULTURES WHEN MULTIPLE SETS ARE DRAWN IS UNCERTAIN. PLEASE NOTIFY THE MICROBIOLOGY DEPARTMENT WITHIN ONE WEEK IF SPECIATION AND SENSITIVITIES ARE REQUIRED. Performed at [facility], [address], [place], [place] [address]    Report Status 06/23/2019 FINAL  Final  Blood Culture ID Panel (Reflexed)     Status: Abnormal   Collection Time: 06/19/19 12:54 PM  Result Value Ref Range Status   Enterococcus species NOT DETECTED NOT DETECTED Final   Listeria monocytogenes NOT DETECTED NOT DETECTED Final   Staphylococcus species DETECTED (A) NOT DETECTED Final    Comment: Methicillin  (oxacillin) susceptible coagulase negative staphylococcus. Possible blood culture contaminant (unless  isolated from more than one blood culture draw or clinical case suggests pathogenicity). No antibiotic treatment is indicated for blood  culture contaminants. CRITICAL RESULT CALLED TO, READ BACK BY AND VERIFIED WITH: PHARMD M MICCIA [id number] AT 911 BY CM    Staphylococcus aureus (BCID) NOT DETECTED NOT DETECTED Final   Methicillin resistance NOT DETECTED NOT DETECTED Final   Streptococcus species NOT DETECTED NOT DETECTED Final   Streptococcus agalactiae NOT DETECTED NOT DETECTED Final   Streptococcus pneumoniae NOT DETECTED NOT DETECTED Final   Streptococcus pyogenes NOT DETECTED NOT DETECTED Final   Acinetobacter baumannii NOT DETECTED NOT DETECTED Final   Enterobacteriaceae species NOT DETECTED NOT DETECTED Final   Enterobacter cloacae complex NOT DETECTED NOT DETECTED Final   Escherichia coli NOT DETECTED NOT DETECTED Final   Klebsiella oxytoca NOT DETECTED NOT DETECTED Final   Klebsiella pneumoniae NOT DETECTED NOT DETECTED Final   Proteus species NOT DETECTED NOT DETECTED Final   Serratia marcescens NOT DETECTED NOT DETECTED Final   Haemophilus influenzae NOT DETECTED NOT DETECTED Final   Neisseria meningitidis NOT DETECTED NOT DETECTED Final   Pseudomonas aeruginosa NOT DETECTED NOT DETECTED Final   Candida albicans NOT DETECTED NOT DETECTED Final   Candida glabrata NOT DETECTED NOT DETECTED Final   Candida krusei NOT DETECTED NOT DETECTED Final   Candida parapsilosis NOT DETECTED NOT DETECTED Final   Candida tropicalis NOT DETECTED NOT DETECTED Final    Comment: Performed at [facility], [address], [place], [place] [address]  SARS Coronavirus 2 by RT PCR (hospital order, performed in [facility] hospital lab) Nasopharyngeal Nasopharyngeal Swab     Status: None   Collection Time: 06/19/19  1:26 PM   Specimen: Nasopharyngeal Swab  Result Value Ref Range Status   SARS  Coronavirus 2 NEGATIVE NEGATIVE Final    Comment: (NOTE) SARS-CoV-2 target nucleic acids are NOT DETECTED.  The SARS-CoV-2 RNA is generally detectable in upper and lower respiratory specimens during the acute phase of infection. The lowest concentration of SARS-CoV-2 viral copies this assay can detect is 250 copies / mL. A negative result does not preclude SARS-CoV-2 infection and should not be used as the sole basis for treatment or other patient management decisions.  A negative result may occur with improper specimen collection / handling, submission of specimen other than nasopharyngeal swab, presence of viral mutation(s) within the areas targeted by this assay, and inadequate number of viral copies (<250 copies / mL). A negative result must be combined with clinical observations, patient history, and epidemiological information.  Fact Sheet for Patients:   BoilerBrush.com.cy  Fact Sheet for Healthcare Providers: https://pope.com/  This test is not yet approved or  cleared by the [place] FDA and has been authorized for detection and/or diagnosis of SARS-CoV-2 by FDA under an Emergency Use Authorization (EUA).  This EUA will remain in effect (meaning this test can be used) for the duration of the COVID-19 declaration under Section 564(b)(1) of the Act, 21 U.S.C. section 360bbb-3(b)(1), unless the authorization is terminated or revoked sooner.  Performed at [facility], [address], [place], [place] [address]   Blood culture (routine x 2)     Status: None (Preliminary result)   Collection Time: 06/19/19  1:26 PM   Specimen: BLOOD LEFT FOREARM  Result Value Ref Range Status   Specimen Description BLOOD LEFT FOREARM  Final   Special Requests   Final    BOTTLES DRAWN AEROBIC AND ANAEROBIC Blood Culture adequate volume  Culture   Final    NO GROWTH 4 DAYS Performed at [facility], [address],  [place], [place] [address]    Report Status PENDING  Incomplete  MRSA PCR Screening     Status: None   Collection Time: 06/20/19  4:19 AM   Specimen: Nasal Mucosa; Nasopharyngeal  Result Value Ref Range Status   MRSA by PCR NEGATIVE NEGATIVE Final    Comment:        The GeneXpert MRSA Assay (FDA approved for NASAL specimens only), is one component of a comprehensive MRSA colonization surveillance program. It is not intended to diagnose MRSA infection nor to guide or monitor treatment for MRSA infections. Performed at [facility], [address] [place], [place] [address]      Labs: BNP (last 3 results) Recent Labs    02/23/19 1626 03/29/19 1318  BNP 76.6 62.9   Basic Metabolic Panel: Recent Labs  Lab 06/19/19 1430 06/20/19 0336 06/21/19 1129 06/22/19 1100 06/23/19 0502  NA 141 141 141 142 141  K 3.1* 3.1* 3.3* 3.7 3.9  CL 103 107 104 106 105  CO2 14* GLUCOSE 151* 87 93 102* 113*  BUN 12 7* <5* <5* 5*  CREATININE 1.22 0.73 0.61 0.66 0.56*  CALCIUM 9.6 8.6* 8.8* 8.7* 8.9  MG  --  1.9 1.6*  --   --   PHOS  --   --  2.7  --   --    Liver Function Tests: Recent Labs  Lab 06/19/19 1430  AST 39  ALT 26  ALKPHOS 101  BILITOT 0.5  PROT 7.9  ALBUMIN 3.5   No results for input(s): LIPASE, AMYLASE in the last 168 hours. No results for input(s): AMMONIA in the last 168 hours. CBC: Recent Labs  Lab 06/19/19 1254 06/20/19 0336 06/21/19 1129  WBC 25.0* 14.2* 10.7*  NEUTROABS 22.4*  --  7.1  HGB 15.9 13.0 13.1  HCT 49.0 38.4* 39.1  MCV 100.6* 97.0 96.5  PLT 378 285 263   Cardiac Enzymes: No results for input(s): CKTOTAL, CKMB, CKMBINDEX, TROPONINI in the last 168 hours. BNP: Invalid input(s): POCBNP CBG: No results for input(s): GLUCAP in the last 168 hours. D-Dimer No results for input(s): DDIMER in the last 72 hours. Hgb A1c No results for input(s): HGBA1C in the last 72 hours. Lipid Profile No results for input(s): CHOL, HDL,  LDLCALC, TRIG, CHOLHDL, LDLDIRECT in the last 72 hours. Thyroid function studies No results for input(s): TSH, T4TOTAL, T3FREE, THYROIDAB in the last 72 hours.  Invalid input(s): FREET3 Anemia work up No results for input(s): VITAMINB12, FOLATE, FERRITIN, TIBC, IRON, RETICCTPCT in the last 72 hours. Urinalysis    Component Value Date/Time   COLORURINE YELLOW 06/19/2019 1254   APPEARANCEUR HAZY (A) 06/19/2019 1254   LABSPEC 1.025 06/19/2019 1254   PHURINE 7.0 06/19/2019 1254   GLUCOSEU NEGATIVE 06/19/2019 1254   HGBUR SMALL (A) 06/19/2019 1254   BILIRUBINUR NEGATIVE 06/19/2019 1254   KETONESUR NEGATIVE 06/19/2019 1254   PROTEINUR >300 (A) 06/19/2019 1254   UROBILINOGEN 0.2 03/22/2014 1206   NITRITE NEGATIVE 06/19/2019 1254   LEUKOCYTESUR SMALL (A) 06/19/2019 1254   Sepsis Labs Invalid input(s): PROCALCITONIN,  WBC,  LACTICIDVEN Microbiology Recent Results (from the past 240 hour(s))  Urine culture     Status: Abnormal   Collection Time: 06/19/19 12:54 PM   Specimen: Urine, Random  Result Value Ref Range Status   Specimen Description URINE, RANDOM  Final   Special Requests   Final    NONE Performed at [facility], [address], [place], [place] [address]    Culture (A)  Final    >=100,000 COLONIES/mL ESCHERICHIA COLI Confirmed Extended Spectrum Beta-Lactamase Producer (ESBL).  In bloodstream infections from ESBL organisms, carbapenems are preferred over piperacillin/tazobactam. They are shown to have a lower risk of mortality.    Report Status 06/21/2019 FINAL  Final   Organism ID, Bacteria ESCHERICHIA COLI (A)  Final      Susceptibility   Escherichia coli - MIC*    AMPICILLIN >=32 RESISTANT Resistant     CEFAZOLIN >=64 RESISTANT Resistant     CEFTRIAXONE >=64 RESISTANT Resistant     CIPROFLOXACIN <=0.25 SENSITIVE Sensitive     GENTAMICIN >=16 RESISTANT Resistant     IMIPENEM <=0.25 SENSITIVE Sensitive     NITROFURANTOIN <=16 SENSITIVE Sensitive      TRIMETH/SULFA >=320 RESISTANT Resistant     AMPICILLIN/SULBACTAM 16 INTERMEDIATE Intermediate     PIP/TAZO <=4 SENSITIVE Sensitive     * >=100,000 COLONIES/mL ESCHERICHIA COLI  Blood culture (routine x 2)     Status: Abnormal   Collection Time: 06/19/19 12:54 PM   Specimen: BLOOD  Result Value Ref Range Status   Specimen Description BLOOD SITE NOT SPECIFIED  Final   Special Requests   Final    BOTTLES DRAWN AEROBIC AND ANAEROBIC Blood Culture adequate volume   Culture  Setup Time   Final    GRAM POSITIVE COCCI IN CLUSTERS ANAEROBIC BOTTLE ONLY CRITICAL RESULT CALLED TO, READ BACK BY AND VERIFIED WITH: PHARMD M MICCIA [id number] AT 910 AM BY CM    Culture (A)  Final    STAPHYLOCOCCUS SPECIES (COAGULASE NEGATIVE) THE SIGNIFICANCE OF ISOLATING THIS ORGANISM FROM A SINGLE SET OF BLOOD CULTURES WHEN MULTIPLE SETS ARE DRAWN IS UNCERTAIN. PLEASE NOTIFY THE MICROBIOLOGY DEPARTMENT WITHIN ONE WEEK IF SPECIATION AND SENSITIVITIES ARE REQUIRED. Performed at [facility], [address], [place], [place] [address]    Report Status 06/23/2019 FINAL  Final  Blood Culture ID Panel (Reflexed)     Status: Abnormal   Collection Time: 06/19/19 12:54 PM  Result Value Ref Range Status   Enterococcus species NOT DETECTED NOT DETECTED Final   Listeria monocytogenes NOT DETECTED NOT DETECTED Final   Staphylococcus species DETECTED (A) NOT DETECTED Final    Comment: Methicillin (oxacillin) susceptible coagulase negative staphylococcus. Possible blood culture contaminant (unless isolated from more than one blood culture draw or clinical case suggests pathogenicity). No antibiotic treatment is indicated for blood  culture contaminants. CRITICAL RESULT CALLED TO, READ BACK BY AND VERIFIED WITH: PHARMD M MICCIA [id number] AT 911 BY CM    Staphylococcus aureus (BCID) NOT DETECTED NOT DETECTED Final   Methicillin resistance NOT DETECTED NOT DETECTED Final   Streptococcus species NOT DETECTED NOT DETECTED Final    Streptococcus agalactiae NOT DETECTED NOT DETECTED Final   Streptococcus pneumoniae NOT DETECTED NOT DETECTED Final   Streptococcus pyogenes NOT DETECTED NOT DETECTED Final   Acinetobacter baumannii NOT DETECTED NOT DETECTED Final   Enterobacteriaceae species NOT DETECTED NOT DETECTED Final   Enterobacter cloacae complex NOT DETECTED NOT DETECTED Final   Escherichia coli NOT DETECTED NOT DETECTED Final   Klebsiella oxytoca NOT DETECTED NOT DETECTED Final   Klebsiella pneumoniae NOT DETECTED NOT DETECTED Final   Proteus species NOT DETECTED NOT DETECTED Final   Serratia marcescens NOT DETECTED NOT DETECTED Final   Haemophilus influenzae NOT DETECTED NOT DETECTED  Final   Neisseria meningitidis NOT DETECTED NOT DETECTED Final   Pseudomonas aeruginosa NOT DETECTED NOT DETECTED Final   Candida albicans NOT DETECTED NOT DETECTED Final   Candida glabrata NOT DETECTED NOT DETECTED Final   Candida krusei NOT DETECTED NOT DETECTED Final   Candida parapsilosis NOT DETECTED NOT DETECTED Final   Candida tropicalis NOT DETECTED NOT DETECTED Final    Comment: Performed at [facility], [address] [place], [place] [address]  SARS Coronavirus 2 by RT PCR (hospital order, performed in [facility] hospital lab) Nasopharyngeal Nasopharyngeal Swab     Status: None   Collection Time: 06/19/19  1:26 PM   Specimen: Nasopharyngeal Swab  Result Value Ref Range Status   SARS Coronavirus 2 NEGATIVE NEGATIVE Final    Comment: (NOTE) SARS-CoV-2 target nucleic acids are NOT DETECTED.  The SARS-CoV-2 RNA is generally detectable in upper and lower respiratory specimens during the acute phase of infection. The lowest concentration of SARS-CoV-2 viral copies this assay can detect is 250 copies / mL. A negative result does not preclude SARS-CoV-2 infection and should not be used as the sole basis for treatment or other patient management decisions.  A negative result may occur with improper specimen  collection / handling, submission of specimen other than nasopharyngeal swab, presence of viral mutation(s) within the areas targeted by this assay, and inadequate number of viral copies (<250 copies / mL). A negative result must be combined with clinical observations, patient history, and epidemiological information.  Fact Sheet for Patients:   BoilerBrush.com.cy  Fact Sheet for Healthcare Providers: https://pope.com/  This test is not yet approved or  cleared by the [place] FDA and has been authorized for detection and/or diagnosis of SARS-CoV-2 by FDA under an Emergency Use Authorization (EUA).  This EUA will remain in effect (meaning this test can be used) for the duration of the COVID-19 declaration under Section 564(b)(1) of the Act, 21 U.S.C. section 360bbb-3(b)(1), unless the authorization is terminated or revoked sooner.  Performed at [facility], [address] [place], [place] [address]   Blood culture (routine x 2)     Status: None (Preliminary result)   Collection Time: 06/19/19  1:26 PM   Specimen: BLOOD LEFT FOREARM  Result Value Ref Range Status   Specimen Description BLOOD LEFT FOREARM  Final   Special Requests   Final    BOTTLES DRAWN AEROBIC AND ANAEROBIC Blood Culture adequate volume   Culture   Final    NO GROWTH 4 DAYS Performed at [facility], [address] [place], [place] [address]    Report Status PENDING  Incomplete  MRSA PCR Screening     Status: None   Collection Time: 06/20/19  4:19 AM   Specimen: Nasal Mucosa; Nasopharyngeal  Result Value Ref Range Status   MRSA by PCR NEGATIVE NEGATIVE Final    Comment:        The GeneXpert MRSA Assay (FDA approved for NASAL specimens only), is one component of a comprehensive MRSA colonization surveillance program. It is not intended to diagnose MRSA infection nor to guide or monitor treatment for MRSA infections. Performed at [facility], [address] [place], [place] [address]      Time coordinating discharge:  35 minutes  SIGNED:   Dorcas Carrow, MD  [facility] 06/24/2019, 10:43 AM

## 2019-06-24 NOTE — Progress Notes (Signed)
Called report to receiving facility, removed IV/s, family aware of d/c, waiting for PTAR.

## 2019-06-24 NOTE — Plan of Care (Signed)
  Problem: Fluid Volume: Goal: Hemodynamic stability will improve Outcome: Adequate for Discharge   Problem: Clinical Measurements: Goal: Diagnostic test results will improve Outcome: Adequate for Discharge Goal: Signs and symptoms of infection will decrease Outcome: Adequate for Discharge   Problem: Respiratory: Goal: Ability to maintain adequate ventilation will improve Outcome: Adequate for Discharge   Problem: Education: Goal: Knowledge of General Education information will improve Description: Including pain rating scale, medication(s)/side effects and non-pharmacologic comfort measures Outcome: Adequate for Discharge   Problem: Activity: Goal: Risk for activity intolerance will decrease Outcome: Adequate for Discharge   Problem: Nutrition: Goal: Adequate nutrition will be maintained Outcome: Adequate for Discharge   Problem: Pain Managment: Goal: General experience of comfort will improve Outcome: Adequate for Discharge   Problem: Safety: Goal: Ability to remain free from injury will improve Outcome: Adequate for Discharge   Problem: Skin Integrity: Goal: Risk for impaired skin integrity will decrease Outcome: Adequate for Discharge

## 2019-06-25 ENCOUNTER — Non-Acute Institutional Stay: Payer: Medicare Other | Admitting: Internal Medicine

## 2019-06-25 DIAGNOSIS — M24561 Contracture, right knee: Secondary | ICD-10-CM | POA: Diagnosis not present

## 2019-06-25 DIAGNOSIS — R2681 Unsteadiness on feet: Secondary | ICD-10-CM | POA: Diagnosis not present

## 2019-06-25 DIAGNOSIS — M6281 Muscle weakness (generalized): Secondary | ICD-10-CM | POA: Diagnosis not present

## 2019-06-25 DIAGNOSIS — R293 Abnormal posture: Secondary | ICD-10-CM | POA: Diagnosis not present

## 2019-06-25 DIAGNOSIS — J188 Other pneumonia, unspecified organism: Secondary | ICD-10-CM | POA: Diagnosis not present

## 2019-06-25 DIAGNOSIS — Z9181 History of falling: Secondary | ICD-10-CM | POA: Diagnosis not present

## 2019-06-25 DIAGNOSIS — B9689 Other specified bacterial agents as the cause of diseases classified elsewhere: Secondary | ICD-10-CM | POA: Diagnosis not present

## 2019-06-25 DIAGNOSIS — J168 Pneumonia due to other specified infectious organisms: Secondary | ICD-10-CM | POA: Diagnosis not present

## 2019-06-25 DIAGNOSIS — J9601 Acute respiratory failure with hypoxia: Secondary | ICD-10-CM | POA: Diagnosis not present

## 2019-06-25 DIAGNOSIS — N390 Urinary tract infection, site not specified: Secondary | ICD-10-CM | POA: Diagnosis not present

## 2019-06-25 DIAGNOSIS — M79662 Pain in left lower leg: Secondary | ICD-10-CM | POA: Diagnosis not present

## 2019-06-25 DIAGNOSIS — R278 Other lack of coordination: Secondary | ICD-10-CM | POA: Diagnosis not present

## 2019-06-25 DIAGNOSIS — R2689 Other abnormalities of gait and mobility: Secondary | ICD-10-CM | POA: Diagnosis not present

## 2019-06-25 DIAGNOSIS — Z7409 Other reduced mobility: Secondary | ICD-10-CM | POA: Diagnosis not present

## 2019-06-25 DIAGNOSIS — M79661 Pain in right lower leg: Secondary | ICD-10-CM | POA: Diagnosis not present

## 2019-06-25 DIAGNOSIS — R1312 Dysphagia, oropharyngeal phase: Secondary | ICD-10-CM | POA: Diagnosis not present

## 2019-06-25 DIAGNOSIS — G9341 Metabolic encephalopathy: Secondary | ICD-10-CM | POA: Diagnosis not present

## 2019-06-25 DIAGNOSIS — J9690 Respiratory failure, unspecified, unspecified whether with hypoxia or hypercapnia: Secondary | ICD-10-CM | POA: Diagnosis not present

## 2019-06-25 DIAGNOSIS — M24571 Contracture, right ankle: Secondary | ICD-10-CM | POA: Diagnosis not present

## 2019-06-25 DIAGNOSIS — R1319 Other dysphagia: Secondary | ICD-10-CM | POA: Diagnosis not present

## 2019-06-25 DIAGNOSIS — M2450 Contracture, unspecified joint: Secondary | ICD-10-CM | POA: Diagnosis not present

## 2019-06-26 DIAGNOSIS — R1319 Other dysphagia: Secondary | ICD-10-CM | POA: Diagnosis not present

## 2019-06-26 DIAGNOSIS — Z9181 History of falling: Secondary | ICD-10-CM | POA: Diagnosis not present

## 2019-06-26 DIAGNOSIS — J168 Pneumonia due to other specified infectious organisms: Secondary | ICD-10-CM | POA: Diagnosis not present

## 2019-06-26 DIAGNOSIS — R278 Other lack of coordination: Secondary | ICD-10-CM | POA: Diagnosis not present

## 2019-06-26 DIAGNOSIS — M24561 Contracture, right knee: Secondary | ICD-10-CM | POA: Diagnosis not present

## 2019-06-26 DIAGNOSIS — R2689 Other abnormalities of gait and mobility: Secondary | ICD-10-CM | POA: Diagnosis not present

## 2019-06-26 DIAGNOSIS — M24571 Contracture, right ankle: Secondary | ICD-10-CM | POA: Diagnosis not present

## 2019-06-26 DIAGNOSIS — Z7409 Other reduced mobility: Secondary | ICD-10-CM | POA: Diagnosis not present

## 2019-06-26 DIAGNOSIS — J9601 Acute respiratory failure with hypoxia: Secondary | ICD-10-CM | POA: Diagnosis not present

## 2019-06-26 DIAGNOSIS — R293 Abnormal posture: Secondary | ICD-10-CM | POA: Diagnosis not present

## 2019-06-26 DIAGNOSIS — M6281 Muscle weakness (generalized): Secondary | ICD-10-CM | POA: Diagnosis not present

## 2019-06-26 DIAGNOSIS — R2681 Unsteadiness on feet: Secondary | ICD-10-CM | POA: Diagnosis not present

## 2019-06-26 DIAGNOSIS — M79661 Pain in right lower leg: Secondary | ICD-10-CM | POA: Diagnosis not present

## 2019-06-26 DIAGNOSIS — G9341 Metabolic encephalopathy: Secondary | ICD-10-CM | POA: Diagnosis not present

## 2019-06-26 DIAGNOSIS — M79662 Pain in left lower leg: Secondary | ICD-10-CM | POA: Diagnosis not present

## 2019-06-26 DIAGNOSIS — M2450 Contracture, unspecified joint: Secondary | ICD-10-CM | POA: Diagnosis not present

## 2019-06-26 DIAGNOSIS — J9690 Respiratory failure, unspecified, unspecified whether with hypoxia or hypercapnia: Secondary | ICD-10-CM | POA: Diagnosis not present

## 2019-06-26 DIAGNOSIS — R1312 Dysphagia, oropharyngeal phase: Secondary | ICD-10-CM | POA: Diagnosis not present

## 2019-06-27 DIAGNOSIS — M79661 Pain in right lower leg: Secondary | ICD-10-CM | POA: Diagnosis not present

## 2019-06-27 DIAGNOSIS — G9341 Metabolic encephalopathy: Secondary | ICD-10-CM | POA: Diagnosis not present

## 2019-06-27 DIAGNOSIS — R1319 Other dysphagia: Secondary | ICD-10-CM | POA: Diagnosis not present

## 2019-06-27 DIAGNOSIS — M2450 Contracture, unspecified joint: Secondary | ICD-10-CM | POA: Diagnosis not present

## 2019-06-27 DIAGNOSIS — R278 Other lack of coordination: Secondary | ICD-10-CM | POA: Diagnosis not present

## 2019-06-27 DIAGNOSIS — J168 Pneumonia due to other specified infectious organisms: Secondary | ICD-10-CM | POA: Diagnosis not present

## 2019-06-27 DIAGNOSIS — M24571 Contracture, right ankle: Secondary | ICD-10-CM | POA: Diagnosis not present

## 2019-06-27 DIAGNOSIS — M79662 Pain in left lower leg: Secondary | ICD-10-CM | POA: Diagnosis not present

## 2019-06-27 DIAGNOSIS — Z9181 History of falling: Secondary | ICD-10-CM | POA: Diagnosis not present

## 2019-06-27 DIAGNOSIS — R293 Abnormal posture: Secondary | ICD-10-CM | POA: Diagnosis not present

## 2019-06-27 DIAGNOSIS — M6281 Muscle weakness (generalized): Secondary | ICD-10-CM | POA: Diagnosis not present

## 2019-06-27 DIAGNOSIS — J9601 Acute respiratory failure with hypoxia: Secondary | ICD-10-CM | POA: Diagnosis not present

## 2019-06-27 DIAGNOSIS — Z7409 Other reduced mobility: Secondary | ICD-10-CM | POA: Diagnosis not present

## 2019-06-27 DIAGNOSIS — R2681 Unsteadiness on feet: Secondary | ICD-10-CM | POA: Diagnosis not present

## 2019-06-27 DIAGNOSIS — R2689 Other abnormalities of gait and mobility: Secondary | ICD-10-CM | POA: Diagnosis not present

## 2019-06-27 DIAGNOSIS — J9690 Respiratory failure, unspecified, unspecified whether with hypoxia or hypercapnia: Secondary | ICD-10-CM | POA: Diagnosis not present

## 2019-06-27 DIAGNOSIS — M24561 Contracture, right knee: Secondary | ICD-10-CM | POA: Diagnosis not present

## 2019-06-27 DIAGNOSIS — R1312 Dysphagia, oropharyngeal phase: Secondary | ICD-10-CM | POA: Diagnosis not present

## 2019-06-28 DIAGNOSIS — R1319 Other dysphagia: Secondary | ICD-10-CM | POA: Diagnosis not present

## 2019-06-28 DIAGNOSIS — M2450 Contracture, unspecified joint: Secondary | ICD-10-CM | POA: Diagnosis not present

## 2019-06-28 DIAGNOSIS — J9601 Acute respiratory failure with hypoxia: Secondary | ICD-10-CM | POA: Diagnosis not present

## 2019-06-28 DIAGNOSIS — Z7409 Other reduced mobility: Secondary | ICD-10-CM | POA: Diagnosis not present

## 2019-06-28 DIAGNOSIS — R1312 Dysphagia, oropharyngeal phase: Secondary | ICD-10-CM | POA: Diagnosis not present

## 2019-06-28 DIAGNOSIS — J9690 Respiratory failure, unspecified, unspecified whether with hypoxia or hypercapnia: Secondary | ICD-10-CM | POA: Diagnosis not present

## 2019-06-28 DIAGNOSIS — Z9181 History of falling: Secondary | ICD-10-CM | POA: Diagnosis not present

## 2019-06-28 DIAGNOSIS — G9341 Metabolic encephalopathy: Secondary | ICD-10-CM | POA: Diagnosis not present

## 2019-06-28 DIAGNOSIS — R293 Abnormal posture: Secondary | ICD-10-CM | POA: Diagnosis not present

## 2019-06-28 DIAGNOSIS — M6281 Muscle weakness (generalized): Secondary | ICD-10-CM | POA: Diagnosis not present

## 2019-06-28 DIAGNOSIS — R278 Other lack of coordination: Secondary | ICD-10-CM | POA: Diagnosis not present

## 2019-06-28 DIAGNOSIS — M79661 Pain in right lower leg: Secondary | ICD-10-CM | POA: Diagnosis not present

## 2019-06-28 DIAGNOSIS — R2681 Unsteadiness on feet: Secondary | ICD-10-CM | POA: Diagnosis not present

## 2019-06-28 DIAGNOSIS — J168 Pneumonia due to other specified infectious organisms: Secondary | ICD-10-CM | POA: Diagnosis not present

## 2019-06-28 DIAGNOSIS — R2689 Other abnormalities of gait and mobility: Secondary | ICD-10-CM | POA: Diagnosis not present

## 2019-06-28 DIAGNOSIS — M79662 Pain in left lower leg: Secondary | ICD-10-CM | POA: Diagnosis not present

## 2019-06-28 DIAGNOSIS — M24561 Contracture, right knee: Secondary | ICD-10-CM | POA: Diagnosis not present

## 2019-06-28 DIAGNOSIS — M24571 Contracture, right ankle: Secondary | ICD-10-CM | POA: Diagnosis not present

## 2019-06-29 DIAGNOSIS — J9690 Respiratory failure, unspecified, unspecified whether with hypoxia or hypercapnia: Secondary | ICD-10-CM | POA: Diagnosis not present

## 2019-06-29 DIAGNOSIS — M24571 Contracture, right ankle: Secondary | ICD-10-CM | POA: Diagnosis not present

## 2019-06-29 DIAGNOSIS — R1319 Other dysphagia: Secondary | ICD-10-CM | POA: Diagnosis not present

## 2019-06-29 DIAGNOSIS — M2450 Contracture, unspecified joint: Secondary | ICD-10-CM | POA: Diagnosis not present

## 2019-06-29 DIAGNOSIS — Z7409 Other reduced mobility: Secondary | ICD-10-CM | POA: Diagnosis not present

## 2019-06-29 DIAGNOSIS — M79662 Pain in left lower leg: Secondary | ICD-10-CM | POA: Diagnosis not present

## 2019-06-29 DIAGNOSIS — R293 Abnormal posture: Secondary | ICD-10-CM | POA: Diagnosis not present

## 2019-06-29 DIAGNOSIS — J9601 Acute respiratory failure with hypoxia: Secondary | ICD-10-CM | POA: Diagnosis not present

## 2019-06-29 DIAGNOSIS — R2689 Other abnormalities of gait and mobility: Secondary | ICD-10-CM | POA: Diagnosis not present

## 2019-06-29 DIAGNOSIS — R278 Other lack of coordination: Secondary | ICD-10-CM | POA: Diagnosis not present

## 2019-06-29 DIAGNOSIS — R2681 Unsteadiness on feet: Secondary | ICD-10-CM | POA: Diagnosis not present

## 2019-06-29 DIAGNOSIS — M6281 Muscle weakness (generalized): Secondary | ICD-10-CM | POA: Diagnosis not present

## 2019-06-29 DIAGNOSIS — M24561 Contracture, right knee: Secondary | ICD-10-CM | POA: Diagnosis not present

## 2019-06-29 DIAGNOSIS — R1312 Dysphagia, oropharyngeal phase: Secondary | ICD-10-CM | POA: Diagnosis not present

## 2019-06-29 DIAGNOSIS — J168 Pneumonia due to other specified infectious organisms: Secondary | ICD-10-CM | POA: Diagnosis not present

## 2019-06-29 DIAGNOSIS — M79661 Pain in right lower leg: Secondary | ICD-10-CM | POA: Diagnosis not present

## 2019-06-29 DIAGNOSIS — G9341 Metabolic encephalopathy: Secondary | ICD-10-CM | POA: Diagnosis not present

## 2019-06-29 DIAGNOSIS — L89153 Pressure ulcer of sacral region, stage 3: Secondary | ICD-10-CM | POA: Diagnosis not present

## 2019-06-29 DIAGNOSIS — Z9181 History of falling: Secondary | ICD-10-CM | POA: Diagnosis not present

## 2019-06-30 DIAGNOSIS — J168 Pneumonia due to other specified infectious organisms: Secondary | ICD-10-CM | POA: Diagnosis not present

## 2019-06-30 DIAGNOSIS — R278 Other lack of coordination: Secondary | ICD-10-CM | POA: Diagnosis not present

## 2019-06-30 DIAGNOSIS — M6281 Muscle weakness (generalized): Secondary | ICD-10-CM | POA: Diagnosis not present

## 2019-06-30 DIAGNOSIS — M2450 Contracture, unspecified joint: Secondary | ICD-10-CM | POA: Diagnosis not present

## 2019-06-30 DIAGNOSIS — R2681 Unsteadiness on feet: Secondary | ICD-10-CM | POA: Diagnosis not present

## 2019-06-30 DIAGNOSIS — R1312 Dysphagia, oropharyngeal phase: Secondary | ICD-10-CM | POA: Diagnosis not present

## 2019-06-30 DIAGNOSIS — M79661 Pain in right lower leg: Secondary | ICD-10-CM | POA: Diagnosis not present

## 2019-06-30 DIAGNOSIS — Z9181 History of falling: Secondary | ICD-10-CM | POA: Diagnosis not present

## 2019-06-30 DIAGNOSIS — Z7409 Other reduced mobility: Secondary | ICD-10-CM | POA: Diagnosis not present

## 2019-06-30 DIAGNOSIS — R2689 Other abnormalities of gait and mobility: Secondary | ICD-10-CM | POA: Diagnosis not present

## 2019-06-30 DIAGNOSIS — M24561 Contracture, right knee: Secondary | ICD-10-CM | POA: Diagnosis not present

## 2019-06-30 DIAGNOSIS — R1319 Other dysphagia: Secondary | ICD-10-CM | POA: Diagnosis not present

## 2019-06-30 DIAGNOSIS — J9690 Respiratory failure, unspecified, unspecified whether with hypoxia or hypercapnia: Secondary | ICD-10-CM | POA: Diagnosis not present

## 2019-06-30 DIAGNOSIS — M79662 Pain in left lower leg: Secondary | ICD-10-CM | POA: Diagnosis not present

## 2019-06-30 DIAGNOSIS — J9601 Acute respiratory failure with hypoxia: Secondary | ICD-10-CM | POA: Diagnosis not present

## 2019-06-30 DIAGNOSIS — M24571 Contracture, right ankle: Secondary | ICD-10-CM | POA: Diagnosis not present

## 2019-06-30 DIAGNOSIS — G9341 Metabolic encephalopathy: Secondary | ICD-10-CM | POA: Diagnosis not present

## 2019-06-30 DIAGNOSIS — R293 Abnormal posture: Secondary | ICD-10-CM | POA: Diagnosis not present

## 2019-07-01 DIAGNOSIS — Z9181 History of falling: Secondary | ICD-10-CM | POA: Diagnosis not present

## 2019-07-01 DIAGNOSIS — M2450 Contracture, unspecified joint: Secondary | ICD-10-CM | POA: Diagnosis not present

## 2019-07-01 DIAGNOSIS — M79662 Pain in left lower leg: Secondary | ICD-10-CM | POA: Diagnosis not present

## 2019-07-01 DIAGNOSIS — R278 Other lack of coordination: Secondary | ICD-10-CM | POA: Diagnosis not present

## 2019-07-01 DIAGNOSIS — J9690 Respiratory failure, unspecified, unspecified whether with hypoxia or hypercapnia: Secondary | ICD-10-CM | POA: Diagnosis not present

## 2019-07-01 DIAGNOSIS — G9341 Metabolic encephalopathy: Secondary | ICD-10-CM | POA: Diagnosis not present

## 2019-07-01 DIAGNOSIS — M24571 Contracture, right ankle: Secondary | ICD-10-CM | POA: Diagnosis not present

## 2019-07-01 DIAGNOSIS — M6281 Muscle weakness (generalized): Secondary | ICD-10-CM | POA: Diagnosis not present

## 2019-07-01 DIAGNOSIS — R2689 Other abnormalities of gait and mobility: Secondary | ICD-10-CM | POA: Diagnosis not present

## 2019-07-01 DIAGNOSIS — Z7409 Other reduced mobility: Secondary | ICD-10-CM | POA: Diagnosis not present

## 2019-07-01 DIAGNOSIS — M79661 Pain in right lower leg: Secondary | ICD-10-CM | POA: Diagnosis not present

## 2019-07-01 DIAGNOSIS — R1319 Other dysphagia: Secondary | ICD-10-CM | POA: Diagnosis not present

## 2019-07-01 DIAGNOSIS — R1312 Dysphagia, oropharyngeal phase: Secondary | ICD-10-CM | POA: Diagnosis not present

## 2019-07-01 DIAGNOSIS — M24561 Contracture, right knee: Secondary | ICD-10-CM | POA: Diagnosis not present

## 2019-07-01 DIAGNOSIS — R293 Abnormal posture: Secondary | ICD-10-CM | POA: Diagnosis not present

## 2019-07-01 DIAGNOSIS — J9601 Acute respiratory failure with hypoxia: Secondary | ICD-10-CM | POA: Diagnosis not present

## 2019-07-01 DIAGNOSIS — J168 Pneumonia due to other specified infectious organisms: Secondary | ICD-10-CM | POA: Diagnosis not present

## 2019-07-01 DIAGNOSIS — R2681 Unsteadiness on feet: Secondary | ICD-10-CM | POA: Diagnosis not present

## 2019-07-02 DIAGNOSIS — Z7409 Other reduced mobility: Secondary | ICD-10-CM | POA: Diagnosis not present

## 2019-07-02 DIAGNOSIS — R2681 Unsteadiness on feet: Secondary | ICD-10-CM | POA: Diagnosis not present

## 2019-07-02 DIAGNOSIS — Z9181 History of falling: Secondary | ICD-10-CM | POA: Diagnosis not present

## 2019-07-02 DIAGNOSIS — M6281 Muscle weakness (generalized): Secondary | ICD-10-CM | POA: Diagnosis not present

## 2019-07-02 DIAGNOSIS — J9601 Acute respiratory failure with hypoxia: Secondary | ICD-10-CM | POA: Diagnosis not present

## 2019-07-02 DIAGNOSIS — L89153 Pressure ulcer of sacral region, stage 3: Secondary | ICD-10-CM | POA: Diagnosis not present

## 2019-07-02 DIAGNOSIS — R1319 Other dysphagia: Secondary | ICD-10-CM | POA: Diagnosis not present

## 2019-07-02 DIAGNOSIS — M24571 Contracture, right ankle: Secondary | ICD-10-CM | POA: Diagnosis not present

## 2019-07-02 DIAGNOSIS — M24561 Contracture, right knee: Secondary | ICD-10-CM | POA: Diagnosis not present

## 2019-07-02 DIAGNOSIS — M2450 Contracture, unspecified joint: Secondary | ICD-10-CM | POA: Diagnosis not present

## 2019-07-02 DIAGNOSIS — M79661 Pain in right lower leg: Secondary | ICD-10-CM | POA: Diagnosis not present

## 2019-07-02 DIAGNOSIS — R278 Other lack of coordination: Secondary | ICD-10-CM | POA: Diagnosis not present

## 2019-07-02 DIAGNOSIS — J9690 Respiratory failure, unspecified, unspecified whether with hypoxia or hypercapnia: Secondary | ICD-10-CM | POA: Diagnosis not present

## 2019-07-02 DIAGNOSIS — R293 Abnormal posture: Secondary | ICD-10-CM | POA: Diagnosis not present

## 2019-07-02 DIAGNOSIS — L89610 Pressure ulcer of right heel, unstageable: Secondary | ICD-10-CM | POA: Diagnosis not present

## 2019-07-02 DIAGNOSIS — R1312 Dysphagia, oropharyngeal phase: Secondary | ICD-10-CM | POA: Diagnosis not present

## 2019-07-02 DIAGNOSIS — J168 Pneumonia due to other specified infectious organisms: Secondary | ICD-10-CM | POA: Diagnosis not present

## 2019-07-02 DIAGNOSIS — R2689 Other abnormalities of gait and mobility: Secondary | ICD-10-CM | POA: Diagnosis not present

## 2019-07-02 DIAGNOSIS — M79662 Pain in left lower leg: Secondary | ICD-10-CM | POA: Diagnosis not present

## 2019-07-02 DIAGNOSIS — G9341 Metabolic encephalopathy: Secondary | ICD-10-CM | POA: Diagnosis not present

## 2019-07-03 DIAGNOSIS — L89610 Pressure ulcer of right heel, unstageable: Secondary | ICD-10-CM | POA: Diagnosis not present

## 2019-07-03 DIAGNOSIS — M2450 Contracture, unspecified joint: Secondary | ICD-10-CM | POA: Diagnosis not present

## 2019-07-03 DIAGNOSIS — M24571 Contracture, right ankle: Secondary | ICD-10-CM | POA: Diagnosis not present

## 2019-07-03 DIAGNOSIS — Z9181 History of falling: Secondary | ICD-10-CM | POA: Diagnosis not present

## 2019-07-03 DIAGNOSIS — M24561 Contracture, right knee: Secondary | ICD-10-CM | POA: Diagnosis not present

## 2019-07-03 DIAGNOSIS — Z7409 Other reduced mobility: Secondary | ICD-10-CM | POA: Diagnosis not present

## 2019-07-03 DIAGNOSIS — R278 Other lack of coordination: Secondary | ICD-10-CM | POA: Diagnosis not present

## 2019-07-03 DIAGNOSIS — J9690 Respiratory failure, unspecified, unspecified whether with hypoxia or hypercapnia: Secondary | ICD-10-CM | POA: Diagnosis not present

## 2019-07-03 DIAGNOSIS — M79662 Pain in left lower leg: Secondary | ICD-10-CM | POA: Diagnosis not present

## 2019-07-03 DIAGNOSIS — M79661 Pain in right lower leg: Secondary | ICD-10-CM | POA: Diagnosis not present

## 2019-07-03 DIAGNOSIS — M6281 Muscle weakness (generalized): Secondary | ICD-10-CM | POA: Diagnosis not present

## 2019-07-03 DIAGNOSIS — R1312 Dysphagia, oropharyngeal phase: Secondary | ICD-10-CM | POA: Diagnosis not present

## 2019-07-03 DIAGNOSIS — J9601 Acute respiratory failure with hypoxia: Secondary | ICD-10-CM | POA: Diagnosis not present

## 2019-07-03 DIAGNOSIS — G9341 Metabolic encephalopathy: Secondary | ICD-10-CM | POA: Diagnosis not present

## 2019-07-03 DIAGNOSIS — R293 Abnormal posture: Secondary | ICD-10-CM | POA: Diagnosis not present

## 2019-07-03 DIAGNOSIS — J168 Pneumonia due to other specified infectious organisms: Secondary | ICD-10-CM | POA: Diagnosis not present

## 2019-07-03 DIAGNOSIS — R2689 Other abnormalities of gait and mobility: Secondary | ICD-10-CM | POA: Diagnosis not present

## 2019-07-03 DIAGNOSIS — R2681 Unsteadiness on feet: Secondary | ICD-10-CM | POA: Diagnosis not present

## 2019-07-03 DIAGNOSIS — R1319 Other dysphagia: Secondary | ICD-10-CM | POA: Diagnosis not present

## 2019-07-04 DIAGNOSIS — R2681 Unsteadiness on feet: Secondary | ICD-10-CM | POA: Diagnosis not present

## 2019-07-04 DIAGNOSIS — R278 Other lack of coordination: Secondary | ICD-10-CM | POA: Diagnosis not present

## 2019-07-04 DIAGNOSIS — M24561 Contracture, right knee: Secondary | ICD-10-CM | POA: Diagnosis not present

## 2019-07-04 DIAGNOSIS — M79662 Pain in left lower leg: Secondary | ICD-10-CM | POA: Diagnosis not present

## 2019-07-04 DIAGNOSIS — M24571 Contracture, right ankle: Secondary | ICD-10-CM | POA: Diagnosis not present

## 2019-07-04 DIAGNOSIS — R2689 Other abnormalities of gait and mobility: Secondary | ICD-10-CM | POA: Diagnosis not present

## 2019-07-04 DIAGNOSIS — J168 Pneumonia due to other specified infectious organisms: Secondary | ICD-10-CM | POA: Diagnosis not present

## 2019-07-04 DIAGNOSIS — M6281 Muscle weakness (generalized): Secondary | ICD-10-CM | POA: Diagnosis not present

## 2019-07-04 DIAGNOSIS — Z7409 Other reduced mobility: Secondary | ICD-10-CM | POA: Diagnosis not present

## 2019-07-04 DIAGNOSIS — M79661 Pain in right lower leg: Secondary | ICD-10-CM | POA: Diagnosis not present

## 2019-07-04 DIAGNOSIS — J9601 Acute respiratory failure with hypoxia: Secondary | ICD-10-CM | POA: Diagnosis not present

## 2019-07-04 DIAGNOSIS — Z9181 History of falling: Secondary | ICD-10-CM | POA: Diagnosis not present

## 2019-07-04 DIAGNOSIS — J9690 Respiratory failure, unspecified, unspecified whether with hypoxia or hypercapnia: Secondary | ICD-10-CM | POA: Diagnosis not present

## 2019-07-04 DIAGNOSIS — M2450 Contracture, unspecified joint: Secondary | ICD-10-CM | POA: Diagnosis not present

## 2019-07-04 DIAGNOSIS — R293 Abnormal posture: Secondary | ICD-10-CM | POA: Diagnosis not present

## 2019-07-04 DIAGNOSIS — R1319 Other dysphagia: Secondary | ICD-10-CM | POA: Diagnosis not present

## 2019-07-04 DIAGNOSIS — G9341 Metabolic encephalopathy: Secondary | ICD-10-CM | POA: Diagnosis not present

## 2019-07-04 DIAGNOSIS — R1312 Dysphagia, oropharyngeal phase: Secondary | ICD-10-CM | POA: Diagnosis not present

## 2019-07-06 DIAGNOSIS — G9341 Metabolic encephalopathy: Secondary | ICD-10-CM | POA: Diagnosis not present

## 2019-07-06 DIAGNOSIS — R1319 Other dysphagia: Secondary | ICD-10-CM | POA: Diagnosis not present

## 2019-07-06 DIAGNOSIS — M6281 Muscle weakness (generalized): Secondary | ICD-10-CM | POA: Diagnosis not present

## 2019-07-06 DIAGNOSIS — J168 Pneumonia due to other specified infectious organisms: Secondary | ICD-10-CM | POA: Diagnosis not present

## 2019-07-06 DIAGNOSIS — M2450 Contracture, unspecified joint: Secondary | ICD-10-CM | POA: Diagnosis not present

## 2019-07-06 DIAGNOSIS — R2689 Other abnormalities of gait and mobility: Secondary | ICD-10-CM | POA: Diagnosis not present

## 2019-07-06 DIAGNOSIS — J9690 Respiratory failure, unspecified, unspecified whether with hypoxia or hypercapnia: Secondary | ICD-10-CM | POA: Diagnosis not present

## 2019-07-06 DIAGNOSIS — M79661 Pain in right lower leg: Secondary | ICD-10-CM | POA: Diagnosis not present

## 2019-07-06 DIAGNOSIS — M79662 Pain in left lower leg: Secondary | ICD-10-CM | POA: Diagnosis not present

## 2019-07-06 DIAGNOSIS — M24571 Contracture, right ankle: Secondary | ICD-10-CM | POA: Diagnosis not present

## 2019-07-06 DIAGNOSIS — R1312 Dysphagia, oropharyngeal phase: Secondary | ICD-10-CM | POA: Diagnosis not present

## 2019-07-06 DIAGNOSIS — L89153 Pressure ulcer of sacral region, stage 3: Secondary | ICD-10-CM | POA: Diagnosis not present

## 2019-07-06 DIAGNOSIS — Z7409 Other reduced mobility: Secondary | ICD-10-CM | POA: Diagnosis not present

## 2019-07-06 DIAGNOSIS — R278 Other lack of coordination: Secondary | ICD-10-CM | POA: Diagnosis not present

## 2019-07-06 DIAGNOSIS — L89610 Pressure ulcer of right heel, unstageable: Secondary | ICD-10-CM | POA: Diagnosis not present

## 2019-07-06 DIAGNOSIS — M24561 Contracture, right knee: Secondary | ICD-10-CM | POA: Diagnosis not present

## 2019-07-06 DIAGNOSIS — R293 Abnormal posture: Secondary | ICD-10-CM | POA: Diagnosis not present

## 2019-07-06 DIAGNOSIS — J9601 Acute respiratory failure with hypoxia: Secondary | ICD-10-CM | POA: Diagnosis not present

## 2019-07-06 DIAGNOSIS — R2681 Unsteadiness on feet: Secondary | ICD-10-CM | POA: Diagnosis not present

## 2019-07-06 DIAGNOSIS — Z9181 History of falling: Secondary | ICD-10-CM | POA: Diagnosis not present

## 2019-07-07 DIAGNOSIS — R278 Other lack of coordination: Secondary | ICD-10-CM | POA: Diagnosis not present

## 2019-07-07 DIAGNOSIS — Z9181 History of falling: Secondary | ICD-10-CM | POA: Diagnosis not present

## 2019-07-07 DIAGNOSIS — M6281 Muscle weakness (generalized): Secondary | ICD-10-CM | POA: Diagnosis not present

## 2019-07-07 DIAGNOSIS — J9690 Respiratory failure, unspecified, unspecified whether with hypoxia or hypercapnia: Secondary | ICD-10-CM | POA: Diagnosis not present

## 2019-07-07 DIAGNOSIS — J168 Pneumonia due to other specified infectious organisms: Secondary | ICD-10-CM | POA: Diagnosis not present

## 2019-07-07 DIAGNOSIS — R2689 Other abnormalities of gait and mobility: Secondary | ICD-10-CM | POA: Diagnosis not present

## 2019-07-07 DIAGNOSIS — R1312 Dysphagia, oropharyngeal phase: Secondary | ICD-10-CM | POA: Diagnosis not present

## 2019-07-07 DIAGNOSIS — J9601 Acute respiratory failure with hypoxia: Secondary | ICD-10-CM | POA: Diagnosis not present

## 2019-07-07 DIAGNOSIS — M79662 Pain in left lower leg: Secondary | ICD-10-CM | POA: Diagnosis not present

## 2019-07-07 DIAGNOSIS — R293 Abnormal posture: Secondary | ICD-10-CM | POA: Diagnosis not present

## 2019-07-07 DIAGNOSIS — M24561 Contracture, right knee: Secondary | ICD-10-CM | POA: Diagnosis not present

## 2019-07-07 DIAGNOSIS — M2450 Contracture, unspecified joint: Secondary | ICD-10-CM | POA: Diagnosis not present

## 2019-07-07 DIAGNOSIS — M79661 Pain in right lower leg: Secondary | ICD-10-CM | POA: Diagnosis not present

## 2019-07-07 DIAGNOSIS — G9341 Metabolic encephalopathy: Secondary | ICD-10-CM | POA: Diagnosis not present

## 2019-07-07 DIAGNOSIS — R1319 Other dysphagia: Secondary | ICD-10-CM | POA: Diagnosis not present

## 2019-07-07 DIAGNOSIS — Z7409 Other reduced mobility: Secondary | ICD-10-CM | POA: Diagnosis not present

## 2019-07-07 DIAGNOSIS — M24571 Contracture, right ankle: Secondary | ICD-10-CM | POA: Diagnosis not present

## 2019-07-07 DIAGNOSIS — L89153 Pressure ulcer of sacral region, stage 3: Secondary | ICD-10-CM | POA: Diagnosis not present

## 2019-07-07 DIAGNOSIS — R2681 Unsteadiness on feet: Secondary | ICD-10-CM | POA: Diagnosis not present

## 2019-07-08 DIAGNOSIS — J9690 Respiratory failure, unspecified, unspecified whether with hypoxia or hypercapnia: Secondary | ICD-10-CM | POA: Diagnosis not present

## 2019-07-08 DIAGNOSIS — M2450 Contracture, unspecified joint: Secondary | ICD-10-CM | POA: Diagnosis not present

## 2019-07-08 DIAGNOSIS — G9341 Metabolic encephalopathy: Secondary | ICD-10-CM | POA: Diagnosis not present

## 2019-07-08 DIAGNOSIS — R2681 Unsteadiness on feet: Secondary | ICD-10-CM | POA: Diagnosis not present

## 2019-07-08 DIAGNOSIS — M79662 Pain in left lower leg: Secondary | ICD-10-CM | POA: Diagnosis not present

## 2019-07-08 DIAGNOSIS — M6281 Muscle weakness (generalized): Secondary | ICD-10-CM | POA: Diagnosis not present

## 2019-07-08 DIAGNOSIS — Z7409 Other reduced mobility: Secondary | ICD-10-CM | POA: Diagnosis not present

## 2019-07-08 DIAGNOSIS — R1319 Other dysphagia: Secondary | ICD-10-CM | POA: Diagnosis not present

## 2019-07-08 DIAGNOSIS — R278 Other lack of coordination: Secondary | ICD-10-CM | POA: Diagnosis not present

## 2019-07-08 DIAGNOSIS — R1312 Dysphagia, oropharyngeal phase: Secondary | ICD-10-CM | POA: Diagnosis not present

## 2019-07-08 DIAGNOSIS — J168 Pneumonia due to other specified infectious organisms: Secondary | ICD-10-CM | POA: Diagnosis not present

## 2019-07-08 DIAGNOSIS — Z9181 History of falling: Secondary | ICD-10-CM | POA: Diagnosis not present

## 2019-07-08 DIAGNOSIS — R293 Abnormal posture: Secondary | ICD-10-CM | POA: Diagnosis not present

## 2019-07-08 DIAGNOSIS — M24561 Contracture, right knee: Secondary | ICD-10-CM | POA: Diagnosis not present

## 2019-07-08 DIAGNOSIS — M24571 Contracture, right ankle: Secondary | ICD-10-CM | POA: Diagnosis not present

## 2019-07-08 DIAGNOSIS — R2689 Other abnormalities of gait and mobility: Secondary | ICD-10-CM | POA: Diagnosis not present

## 2019-07-08 DIAGNOSIS — J9601 Acute respiratory failure with hypoxia: Secondary | ICD-10-CM | POA: Diagnosis not present

## 2019-07-08 DIAGNOSIS — M79661 Pain in right lower leg: Secondary | ICD-10-CM | POA: Diagnosis not present

## 2019-07-09 DIAGNOSIS — Z7409 Other reduced mobility: Secondary | ICD-10-CM | POA: Diagnosis not present

## 2019-07-09 DIAGNOSIS — M79662 Pain in left lower leg: Secondary | ICD-10-CM | POA: Diagnosis not present

## 2019-07-09 DIAGNOSIS — R1319 Other dysphagia: Secondary | ICD-10-CM | POA: Diagnosis not present

## 2019-07-09 DIAGNOSIS — J690 Pneumonitis due to inhalation of food and vomit: Secondary | ICD-10-CM | POA: Diagnosis not present

## 2019-07-09 DIAGNOSIS — M24571 Contracture, right ankle: Secondary | ICD-10-CM | POA: Diagnosis not present

## 2019-07-09 DIAGNOSIS — R278 Other lack of coordination: Secondary | ICD-10-CM | POA: Diagnosis not present

## 2019-07-09 DIAGNOSIS — R293 Abnormal posture: Secondary | ICD-10-CM | POA: Diagnosis not present

## 2019-07-09 DIAGNOSIS — J9601 Acute respiratory failure with hypoxia: Secondary | ICD-10-CM | POA: Diagnosis not present

## 2019-07-09 DIAGNOSIS — J168 Pneumonia due to other specified infectious organisms: Secondary | ICD-10-CM | POA: Diagnosis not present

## 2019-07-09 DIAGNOSIS — G9341 Metabolic encephalopathy: Secondary | ICD-10-CM | POA: Diagnosis not present

## 2019-07-09 DIAGNOSIS — R2689 Other abnormalities of gait and mobility: Secondary | ICD-10-CM | POA: Diagnosis not present

## 2019-07-09 DIAGNOSIS — M79661 Pain in right lower leg: Secondary | ICD-10-CM | POA: Diagnosis not present

## 2019-07-09 DIAGNOSIS — M2450 Contracture, unspecified joint: Secondary | ICD-10-CM | POA: Diagnosis not present

## 2019-07-09 DIAGNOSIS — R2681 Unsteadiness on feet: Secondary | ICD-10-CM | POA: Diagnosis not present

## 2019-07-09 DIAGNOSIS — Z9181 History of falling: Secondary | ICD-10-CM | POA: Diagnosis not present

## 2019-07-09 DIAGNOSIS — M24561 Contracture, right knee: Secondary | ICD-10-CM | POA: Diagnosis not present

## 2019-07-09 DIAGNOSIS — R1312 Dysphagia, oropharyngeal phase: Secondary | ICD-10-CM | POA: Diagnosis not present

## 2019-07-09 DIAGNOSIS — M6281 Muscle weakness (generalized): Secondary | ICD-10-CM | POA: Diagnosis not present

## 2019-07-10 DIAGNOSIS — Z7409 Other reduced mobility: Secondary | ICD-10-CM | POA: Diagnosis not present

## 2019-07-10 DIAGNOSIS — R569 Unspecified convulsions: Secondary | ICD-10-CM | POA: Diagnosis not present

## 2019-07-10 DIAGNOSIS — J168 Pneumonia due to other specified infectious organisms: Secondary | ICD-10-CM | POA: Diagnosis not present

## 2019-07-10 DIAGNOSIS — Z9181 History of falling: Secondary | ICD-10-CM | POA: Diagnosis not present

## 2019-07-10 DIAGNOSIS — R2689 Other abnormalities of gait and mobility: Secondary | ICD-10-CM | POA: Diagnosis not present

## 2019-07-10 DIAGNOSIS — R278 Other lack of coordination: Secondary | ICD-10-CM | POA: Diagnosis not present

## 2019-07-10 DIAGNOSIS — M79662 Pain in left lower leg: Secondary | ICD-10-CM | POA: Diagnosis not present

## 2019-07-10 DIAGNOSIS — J690 Pneumonitis due to inhalation of food and vomit: Secondary | ICD-10-CM | POA: Diagnosis not present

## 2019-07-10 DIAGNOSIS — R2681 Unsteadiness on feet: Secondary | ICD-10-CM | POA: Diagnosis not present

## 2019-07-10 DIAGNOSIS — M79661 Pain in right lower leg: Secondary | ICD-10-CM | POA: Diagnosis not present

## 2019-07-10 DIAGNOSIS — M2450 Contracture, unspecified joint: Secondary | ICD-10-CM | POA: Diagnosis not present

## 2019-07-10 DIAGNOSIS — M24561 Contracture, right knee: Secondary | ICD-10-CM | POA: Diagnosis not present

## 2019-07-10 DIAGNOSIS — J9601 Acute respiratory failure with hypoxia: Secondary | ICD-10-CM | POA: Diagnosis not present

## 2019-07-10 DIAGNOSIS — R1319 Other dysphagia: Secondary | ICD-10-CM | POA: Diagnosis not present

## 2019-07-10 DIAGNOSIS — M24571 Contracture, right ankle: Secondary | ICD-10-CM | POA: Diagnosis not present

## 2019-07-10 DIAGNOSIS — R293 Abnormal posture: Secondary | ICD-10-CM | POA: Diagnosis not present

## 2019-07-10 DIAGNOSIS — M6281 Muscle weakness (generalized): Secondary | ICD-10-CM | POA: Diagnosis not present

## 2019-07-10 DIAGNOSIS — G9341 Metabolic encephalopathy: Secondary | ICD-10-CM | POA: Diagnosis not present

## 2019-07-10 DIAGNOSIS — R1312 Dysphagia, oropharyngeal phase: Secondary | ICD-10-CM | POA: Diagnosis not present

## 2019-07-11 DIAGNOSIS — J168 Pneumonia due to other specified infectious organisms: Secondary | ICD-10-CM | POA: Diagnosis not present

## 2019-07-11 DIAGNOSIS — R293 Abnormal posture: Secondary | ICD-10-CM | POA: Diagnosis not present

## 2019-07-11 DIAGNOSIS — G9341 Metabolic encephalopathy: Secondary | ICD-10-CM | POA: Diagnosis not present

## 2019-07-11 DIAGNOSIS — M2450 Contracture, unspecified joint: Secondary | ICD-10-CM | POA: Diagnosis not present

## 2019-07-11 DIAGNOSIS — R2689 Other abnormalities of gait and mobility: Secondary | ICD-10-CM | POA: Diagnosis not present

## 2019-07-11 DIAGNOSIS — R1319 Other dysphagia: Secondary | ICD-10-CM | POA: Diagnosis not present

## 2019-07-11 DIAGNOSIS — M6281 Muscle weakness (generalized): Secondary | ICD-10-CM | POA: Diagnosis not present

## 2019-07-11 DIAGNOSIS — R2681 Unsteadiness on feet: Secondary | ICD-10-CM | POA: Diagnosis not present

## 2019-07-11 DIAGNOSIS — J9601 Acute respiratory failure with hypoxia: Secondary | ICD-10-CM | POA: Diagnosis not present

## 2019-07-11 DIAGNOSIS — J690 Pneumonitis due to inhalation of food and vomit: Secondary | ICD-10-CM | POA: Diagnosis not present

## 2019-07-11 DIAGNOSIS — M79661 Pain in right lower leg: Secondary | ICD-10-CM | POA: Diagnosis not present

## 2019-07-11 DIAGNOSIS — R1312 Dysphagia, oropharyngeal phase: Secondary | ICD-10-CM | POA: Diagnosis not present

## 2019-07-11 DIAGNOSIS — Z7409 Other reduced mobility: Secondary | ICD-10-CM | POA: Diagnosis not present

## 2019-07-11 DIAGNOSIS — M24571 Contracture, right ankle: Secondary | ICD-10-CM | POA: Diagnosis not present

## 2019-07-11 DIAGNOSIS — Z9181 History of falling: Secondary | ICD-10-CM | POA: Diagnosis not present

## 2019-07-11 DIAGNOSIS — R278 Other lack of coordination: Secondary | ICD-10-CM | POA: Diagnosis not present

## 2019-07-11 DIAGNOSIS — M79662 Pain in left lower leg: Secondary | ICD-10-CM | POA: Diagnosis not present

## 2019-07-11 DIAGNOSIS — M24561 Contracture, right knee: Secondary | ICD-10-CM | POA: Diagnosis not present

## 2019-07-12 DIAGNOSIS — R1319 Other dysphagia: Secondary | ICD-10-CM | POA: Diagnosis not present

## 2019-07-12 DIAGNOSIS — R1312 Dysphagia, oropharyngeal phase: Secondary | ICD-10-CM | POA: Diagnosis not present

## 2019-07-12 DIAGNOSIS — J9601 Acute respiratory failure with hypoxia: Secondary | ICD-10-CM | POA: Diagnosis not present

## 2019-07-12 DIAGNOSIS — R278 Other lack of coordination: Secondary | ICD-10-CM | POA: Diagnosis not present

## 2019-07-12 DIAGNOSIS — R293 Abnormal posture: Secondary | ICD-10-CM | POA: Diagnosis not present

## 2019-07-12 DIAGNOSIS — M79661 Pain in right lower leg: Secondary | ICD-10-CM | POA: Diagnosis not present

## 2019-07-12 DIAGNOSIS — Z9181 History of falling: Secondary | ICD-10-CM | POA: Diagnosis not present

## 2019-07-12 DIAGNOSIS — R2689 Other abnormalities of gait and mobility: Secondary | ICD-10-CM | POA: Diagnosis not present

## 2019-07-12 DIAGNOSIS — M6281 Muscle weakness (generalized): Secondary | ICD-10-CM | POA: Diagnosis not present

## 2019-07-12 DIAGNOSIS — J168 Pneumonia due to other specified infectious organisms: Secondary | ICD-10-CM | POA: Diagnosis not present

## 2019-07-12 DIAGNOSIS — M24561 Contracture, right knee: Secondary | ICD-10-CM | POA: Diagnosis not present

## 2019-07-12 DIAGNOSIS — M79662 Pain in left lower leg: Secondary | ICD-10-CM | POA: Diagnosis not present

## 2019-07-12 DIAGNOSIS — M24571 Contracture, right ankle: Secondary | ICD-10-CM | POA: Diagnosis not present

## 2019-07-12 DIAGNOSIS — R2681 Unsteadiness on feet: Secondary | ICD-10-CM | POA: Diagnosis not present

## 2019-07-12 DIAGNOSIS — Z7409 Other reduced mobility: Secondary | ICD-10-CM | POA: Diagnosis not present

## 2019-07-12 DIAGNOSIS — J690 Pneumonitis due to inhalation of food and vomit: Secondary | ICD-10-CM | POA: Diagnosis not present

## 2019-07-12 DIAGNOSIS — G9341 Metabolic encephalopathy: Secondary | ICD-10-CM | POA: Diagnosis not present

## 2019-07-12 DIAGNOSIS — M2450 Contracture, unspecified joint: Secondary | ICD-10-CM | POA: Diagnosis not present

## 2019-07-13 DIAGNOSIS — R1319 Other dysphagia: Secondary | ICD-10-CM | POA: Diagnosis not present

## 2019-07-13 DIAGNOSIS — G9341 Metabolic encephalopathy: Secondary | ICD-10-CM | POA: Diagnosis not present

## 2019-07-13 DIAGNOSIS — R293 Abnormal posture: Secondary | ICD-10-CM | POA: Diagnosis not present

## 2019-07-13 DIAGNOSIS — R2681 Unsteadiness on feet: Secondary | ICD-10-CM | POA: Diagnosis not present

## 2019-07-13 DIAGNOSIS — M79661 Pain in right lower leg: Secondary | ICD-10-CM | POA: Diagnosis not present

## 2019-07-13 DIAGNOSIS — M24571 Contracture, right ankle: Secondary | ICD-10-CM | POA: Diagnosis not present

## 2019-07-13 DIAGNOSIS — J9601 Acute respiratory failure with hypoxia: Secondary | ICD-10-CM | POA: Diagnosis not present

## 2019-07-13 DIAGNOSIS — R278 Other lack of coordination: Secondary | ICD-10-CM | POA: Diagnosis not present

## 2019-07-13 DIAGNOSIS — M6281 Muscle weakness (generalized): Secondary | ICD-10-CM | POA: Diagnosis not present

## 2019-07-13 DIAGNOSIS — J168 Pneumonia due to other specified infectious organisms: Secondary | ICD-10-CM | POA: Diagnosis not present

## 2019-07-13 DIAGNOSIS — Z9181 History of falling: Secondary | ICD-10-CM | POA: Diagnosis not present

## 2019-07-13 DIAGNOSIS — M2450 Contracture, unspecified joint: Secondary | ICD-10-CM | POA: Diagnosis not present

## 2019-07-13 DIAGNOSIS — J690 Pneumonitis due to inhalation of food and vomit: Secondary | ICD-10-CM | POA: Diagnosis not present

## 2019-07-13 DIAGNOSIS — Z7409 Other reduced mobility: Secondary | ICD-10-CM | POA: Diagnosis not present

## 2019-07-13 DIAGNOSIS — M24561 Contracture, right knee: Secondary | ICD-10-CM | POA: Diagnosis not present

## 2019-07-13 DIAGNOSIS — M79662 Pain in left lower leg: Secondary | ICD-10-CM | POA: Diagnosis not present

## 2019-07-13 DIAGNOSIS — R1312 Dysphagia, oropharyngeal phase: Secondary | ICD-10-CM | POA: Diagnosis not present

## 2019-07-13 DIAGNOSIS — R2689 Other abnormalities of gait and mobility: Secondary | ICD-10-CM | POA: Diagnosis not present

## 2019-07-14 DIAGNOSIS — R1319 Other dysphagia: Secondary | ICD-10-CM | POA: Diagnosis not present

## 2019-07-14 DIAGNOSIS — M6281 Muscle weakness (generalized): Secondary | ICD-10-CM | POA: Diagnosis not present

## 2019-07-14 DIAGNOSIS — M2450 Contracture, unspecified joint: Secondary | ICD-10-CM | POA: Diagnosis not present

## 2019-07-14 DIAGNOSIS — Z7409 Other reduced mobility: Secondary | ICD-10-CM | POA: Diagnosis not present

## 2019-07-14 DIAGNOSIS — R2681 Unsteadiness on feet: Secondary | ICD-10-CM | POA: Diagnosis not present

## 2019-07-14 DIAGNOSIS — M79661 Pain in right lower leg: Secondary | ICD-10-CM | POA: Diagnosis not present

## 2019-07-14 DIAGNOSIS — M79662 Pain in left lower leg: Secondary | ICD-10-CM | POA: Diagnosis not present

## 2019-07-14 DIAGNOSIS — M24561 Contracture, right knee: Secondary | ICD-10-CM | POA: Diagnosis not present

## 2019-07-14 DIAGNOSIS — R278 Other lack of coordination: Secondary | ICD-10-CM | POA: Diagnosis not present

## 2019-07-14 DIAGNOSIS — R293 Abnormal posture: Secondary | ICD-10-CM | POA: Diagnosis not present

## 2019-07-14 DIAGNOSIS — G9341 Metabolic encephalopathy: Secondary | ICD-10-CM | POA: Diagnosis not present

## 2019-07-14 DIAGNOSIS — Z9181 History of falling: Secondary | ICD-10-CM | POA: Diagnosis not present

## 2019-07-14 DIAGNOSIS — J168 Pneumonia due to other specified infectious organisms: Secondary | ICD-10-CM | POA: Diagnosis not present

## 2019-07-14 DIAGNOSIS — J9601 Acute respiratory failure with hypoxia: Secondary | ICD-10-CM | POA: Diagnosis not present

## 2019-07-14 DIAGNOSIS — J690 Pneumonitis due to inhalation of food and vomit: Secondary | ICD-10-CM | POA: Diagnosis not present

## 2019-07-14 DIAGNOSIS — R2689 Other abnormalities of gait and mobility: Secondary | ICD-10-CM | POA: Diagnosis not present

## 2019-07-14 DIAGNOSIS — M24571 Contracture, right ankle: Secondary | ICD-10-CM | POA: Diagnosis not present

## 2019-07-14 DIAGNOSIS — R1312 Dysphagia, oropharyngeal phase: Secondary | ICD-10-CM | POA: Diagnosis not present

## 2019-07-15 DIAGNOSIS — Z9114 Patient's other noncompliance with medication regimen: Secondary | ICD-10-CM | POA: Diagnosis not present

## 2019-07-15 DIAGNOSIS — M79661 Pain in right lower leg: Secondary | ICD-10-CM | POA: Diagnosis not present

## 2019-07-15 DIAGNOSIS — R293 Abnormal posture: Secondary | ICD-10-CM | POA: Diagnosis not present

## 2019-07-15 DIAGNOSIS — M2450 Contracture, unspecified joint: Secondary | ICD-10-CM | POA: Diagnosis not present

## 2019-07-15 DIAGNOSIS — R278 Other lack of coordination: Secondary | ICD-10-CM | POA: Diagnosis not present

## 2019-07-15 DIAGNOSIS — R1319 Other dysphagia: Secondary | ICD-10-CM | POA: Diagnosis not present

## 2019-07-15 DIAGNOSIS — M6281 Muscle weakness (generalized): Secondary | ICD-10-CM | POA: Diagnosis not present

## 2019-07-15 DIAGNOSIS — Z79899 Other long term (current) drug therapy: Secondary | ICD-10-CM | POA: Diagnosis not present

## 2019-07-15 DIAGNOSIS — G9341 Metabolic encephalopathy: Secondary | ICD-10-CM | POA: Diagnosis not present

## 2019-07-15 DIAGNOSIS — R2689 Other abnormalities of gait and mobility: Secondary | ICD-10-CM | POA: Diagnosis not present

## 2019-07-15 DIAGNOSIS — R1312 Dysphagia, oropharyngeal phase: Secondary | ICD-10-CM | POA: Diagnosis not present

## 2019-07-15 DIAGNOSIS — Z9181 History of falling: Secondary | ICD-10-CM | POA: Diagnosis not present

## 2019-07-15 DIAGNOSIS — Z7409 Other reduced mobility: Secondary | ICD-10-CM | POA: Diagnosis not present

## 2019-07-15 DIAGNOSIS — I10 Essential (primary) hypertension: Secondary | ICD-10-CM | POA: Diagnosis not present

## 2019-07-15 DIAGNOSIS — M79662 Pain in left lower leg: Secondary | ICD-10-CM | POA: Diagnosis not present

## 2019-07-15 DIAGNOSIS — M24571 Contracture, right ankle: Secondary | ICD-10-CM | POA: Diagnosis not present

## 2019-07-15 DIAGNOSIS — J9601 Acute respiratory failure with hypoxia: Secondary | ICD-10-CM | POA: Diagnosis not present

## 2019-07-15 DIAGNOSIS — J168 Pneumonia due to other specified infectious organisms: Secondary | ICD-10-CM | POA: Diagnosis not present

## 2019-07-15 DIAGNOSIS — R2681 Unsteadiness on feet: Secondary | ICD-10-CM | POA: Diagnosis not present

## 2019-07-15 DIAGNOSIS — M24561 Contracture, right knee: Secondary | ICD-10-CM | POA: Diagnosis not present

## 2019-07-15 DIAGNOSIS — J690 Pneumonitis due to inhalation of food and vomit: Secondary | ICD-10-CM | POA: Diagnosis not present

## 2019-07-16 DIAGNOSIS — J168 Pneumonia due to other specified infectious organisms: Secondary | ICD-10-CM | POA: Diagnosis not present

## 2019-07-16 DIAGNOSIS — R2689 Other abnormalities of gait and mobility: Secondary | ICD-10-CM | POA: Diagnosis not present

## 2019-07-16 DIAGNOSIS — M79661 Pain in right lower leg: Secondary | ICD-10-CM | POA: Diagnosis not present

## 2019-07-16 DIAGNOSIS — R05 Cough: Secondary | ICD-10-CM | POA: Diagnosis not present

## 2019-07-16 DIAGNOSIS — T17908A Unspecified foreign body in respiratory tract, part unspecified causing other injury, initial encounter: Secondary | ICD-10-CM | POA: Diagnosis not present

## 2019-07-16 DIAGNOSIS — R278 Other lack of coordination: Secondary | ICD-10-CM | POA: Diagnosis not present

## 2019-07-16 DIAGNOSIS — R2681 Unsteadiness on feet: Secondary | ICD-10-CM | POA: Diagnosis not present

## 2019-07-16 DIAGNOSIS — R1319 Other dysphagia: Secondary | ICD-10-CM | POA: Diagnosis not present

## 2019-07-16 DIAGNOSIS — Z9181 History of falling: Secondary | ICD-10-CM | POA: Diagnosis not present

## 2019-07-16 DIAGNOSIS — M24561 Contracture, right knee: Secondary | ICD-10-CM | POA: Diagnosis not present

## 2019-07-16 DIAGNOSIS — M2450 Contracture, unspecified joint: Secondary | ICD-10-CM | POA: Diagnosis not present

## 2019-07-16 DIAGNOSIS — M79662 Pain in left lower leg: Secondary | ICD-10-CM | POA: Diagnosis not present

## 2019-07-16 DIAGNOSIS — G9341 Metabolic encephalopathy: Secondary | ICD-10-CM | POA: Diagnosis not present

## 2019-07-16 DIAGNOSIS — M6281 Muscle weakness (generalized): Secondary | ICD-10-CM | POA: Diagnosis not present

## 2019-07-16 DIAGNOSIS — J9601 Acute respiratory failure with hypoxia: Secondary | ICD-10-CM | POA: Diagnosis not present

## 2019-07-16 DIAGNOSIS — J690 Pneumonitis due to inhalation of food and vomit: Secondary | ICD-10-CM | POA: Diagnosis not present

## 2019-07-16 DIAGNOSIS — R198 Other specified symptoms and signs involving the digestive system and abdomen: Secondary | ICD-10-CM | POA: Diagnosis not present

## 2019-07-16 DIAGNOSIS — R1312 Dysphagia, oropharyngeal phase: Secondary | ICD-10-CM | POA: Diagnosis not present

## 2019-07-16 DIAGNOSIS — R293 Abnormal posture: Secondary | ICD-10-CM | POA: Diagnosis not present

## 2019-07-16 DIAGNOSIS — M24571 Contracture, right ankle: Secondary | ICD-10-CM | POA: Diagnosis not present

## 2019-07-16 DIAGNOSIS — Z7409 Other reduced mobility: Secondary | ICD-10-CM | POA: Diagnosis not present

## 2019-07-17 DIAGNOSIS — G9341 Metabolic encephalopathy: Secondary | ICD-10-CM | POA: Diagnosis not present

## 2019-07-17 DIAGNOSIS — R2681 Unsteadiness on feet: Secondary | ICD-10-CM | POA: Diagnosis not present

## 2019-07-17 DIAGNOSIS — Z9181 History of falling: Secondary | ICD-10-CM | POA: Diagnosis not present

## 2019-07-17 DIAGNOSIS — J9601 Acute respiratory failure with hypoxia: Secondary | ICD-10-CM | POA: Diagnosis not present

## 2019-07-17 DIAGNOSIS — M24561 Contracture, right knee: Secondary | ICD-10-CM | POA: Diagnosis not present

## 2019-07-17 DIAGNOSIS — M79662 Pain in left lower leg: Secondary | ICD-10-CM | POA: Diagnosis not present

## 2019-07-17 DIAGNOSIS — J690 Pneumonitis due to inhalation of food and vomit: Secondary | ICD-10-CM | POA: Diagnosis not present

## 2019-07-17 DIAGNOSIS — R1312 Dysphagia, oropharyngeal phase: Secondary | ICD-10-CM | POA: Diagnosis not present

## 2019-07-17 DIAGNOSIS — M2450 Contracture, unspecified joint: Secondary | ICD-10-CM | POA: Diagnosis not present

## 2019-07-17 DIAGNOSIS — M24571 Contracture, right ankle: Secondary | ICD-10-CM | POA: Diagnosis not present

## 2019-07-17 DIAGNOSIS — M6281 Muscle weakness (generalized): Secondary | ICD-10-CM | POA: Diagnosis not present

## 2019-07-17 DIAGNOSIS — J168 Pneumonia due to other specified infectious organisms: Secondary | ICD-10-CM | POA: Diagnosis not present

## 2019-07-17 DIAGNOSIS — R278 Other lack of coordination: Secondary | ICD-10-CM | POA: Diagnosis not present

## 2019-07-17 DIAGNOSIS — R1319 Other dysphagia: Secondary | ICD-10-CM | POA: Diagnosis not present

## 2019-07-17 DIAGNOSIS — R293 Abnormal posture: Secondary | ICD-10-CM | POA: Diagnosis not present

## 2019-07-17 DIAGNOSIS — R2689 Other abnormalities of gait and mobility: Secondary | ICD-10-CM | POA: Diagnosis not present

## 2019-07-17 DIAGNOSIS — Z7409 Other reduced mobility: Secondary | ICD-10-CM | POA: Diagnosis not present

## 2019-07-17 DIAGNOSIS — M79661 Pain in right lower leg: Secondary | ICD-10-CM | POA: Diagnosis not present

## 2019-07-18 DIAGNOSIS — J168 Pneumonia due to other specified infectious organisms: Secondary | ICD-10-CM | POA: Diagnosis not present

## 2019-07-18 DIAGNOSIS — R2681 Unsteadiness on feet: Secondary | ICD-10-CM | POA: Diagnosis not present

## 2019-07-18 DIAGNOSIS — M79662 Pain in left lower leg: Secondary | ICD-10-CM | POA: Diagnosis not present

## 2019-07-18 DIAGNOSIS — R293 Abnormal posture: Secondary | ICD-10-CM | POA: Diagnosis not present

## 2019-07-18 DIAGNOSIS — Z9181 History of falling: Secondary | ICD-10-CM | POA: Diagnosis not present

## 2019-07-18 DIAGNOSIS — R1319 Other dysphagia: Secondary | ICD-10-CM | POA: Diagnosis not present

## 2019-07-18 DIAGNOSIS — J9601 Acute respiratory failure with hypoxia: Secondary | ICD-10-CM | POA: Diagnosis not present

## 2019-07-18 DIAGNOSIS — M24561 Contracture, right knee: Secondary | ICD-10-CM | POA: Diagnosis not present

## 2019-07-18 DIAGNOSIS — Z7409 Other reduced mobility: Secondary | ICD-10-CM | POA: Diagnosis not present

## 2019-07-18 DIAGNOSIS — M79661 Pain in right lower leg: Secondary | ICD-10-CM | POA: Diagnosis not present

## 2019-07-18 DIAGNOSIS — M6281 Muscle weakness (generalized): Secondary | ICD-10-CM | POA: Diagnosis not present

## 2019-07-18 DIAGNOSIS — M24571 Contracture, right ankle: Secondary | ICD-10-CM | POA: Diagnosis not present

## 2019-07-18 DIAGNOSIS — J690 Pneumonitis due to inhalation of food and vomit: Secondary | ICD-10-CM | POA: Diagnosis not present

## 2019-07-18 DIAGNOSIS — R2689 Other abnormalities of gait and mobility: Secondary | ICD-10-CM | POA: Diagnosis not present

## 2019-07-18 DIAGNOSIS — R278 Other lack of coordination: Secondary | ICD-10-CM | POA: Diagnosis not present

## 2019-07-18 DIAGNOSIS — R1312 Dysphagia, oropharyngeal phase: Secondary | ICD-10-CM | POA: Diagnosis not present

## 2019-07-18 DIAGNOSIS — M2450 Contracture, unspecified joint: Secondary | ICD-10-CM | POA: Diagnosis not present

## 2019-07-18 DIAGNOSIS — G9341 Metabolic encephalopathy: Secondary | ICD-10-CM | POA: Diagnosis not present

## 2019-07-19 DIAGNOSIS — J690 Pneumonitis due to inhalation of food and vomit: Secondary | ICD-10-CM | POA: Diagnosis not present

## 2019-07-19 DIAGNOSIS — R278 Other lack of coordination: Secondary | ICD-10-CM | POA: Diagnosis not present

## 2019-07-19 DIAGNOSIS — M24561 Contracture, right knee: Secondary | ICD-10-CM | POA: Diagnosis not present

## 2019-07-19 DIAGNOSIS — M79662 Pain in left lower leg: Secondary | ICD-10-CM | POA: Diagnosis not present

## 2019-07-19 DIAGNOSIS — R2681 Unsteadiness on feet: Secondary | ICD-10-CM | POA: Diagnosis not present

## 2019-07-19 DIAGNOSIS — G9341 Metabolic encephalopathy: Secondary | ICD-10-CM | POA: Diagnosis not present

## 2019-07-19 DIAGNOSIS — R1312 Dysphagia, oropharyngeal phase: Secondary | ICD-10-CM | POA: Diagnosis not present

## 2019-07-19 DIAGNOSIS — J168 Pneumonia due to other specified infectious organisms: Secondary | ICD-10-CM | POA: Diagnosis not present

## 2019-07-19 DIAGNOSIS — Z9181 History of falling: Secondary | ICD-10-CM | POA: Diagnosis not present

## 2019-07-19 DIAGNOSIS — J9601 Acute respiratory failure with hypoxia: Secondary | ICD-10-CM | POA: Diagnosis not present

## 2019-07-19 DIAGNOSIS — R1319 Other dysphagia: Secondary | ICD-10-CM | POA: Diagnosis not present

## 2019-07-19 DIAGNOSIS — R2689 Other abnormalities of gait and mobility: Secondary | ICD-10-CM | POA: Diagnosis not present

## 2019-07-19 DIAGNOSIS — R293 Abnormal posture: Secondary | ICD-10-CM | POA: Diagnosis not present

## 2019-07-19 DIAGNOSIS — Z7409 Other reduced mobility: Secondary | ICD-10-CM | POA: Diagnosis not present

## 2019-07-19 DIAGNOSIS — M24571 Contracture, right ankle: Secondary | ICD-10-CM | POA: Diagnosis not present

## 2019-07-19 DIAGNOSIS — M6281 Muscle weakness (generalized): Secondary | ICD-10-CM | POA: Diagnosis not present

## 2019-07-19 DIAGNOSIS — M79661 Pain in right lower leg: Secondary | ICD-10-CM | POA: Diagnosis not present

## 2019-07-19 DIAGNOSIS — M2450 Contracture, unspecified joint: Secondary | ICD-10-CM | POA: Diagnosis not present

## 2019-07-20 DIAGNOSIS — R198 Other specified symptoms and signs involving the digestive system and abdomen: Secondary | ICD-10-CM | POA: Diagnosis not present

## 2019-07-20 DIAGNOSIS — Z9181 History of falling: Secondary | ICD-10-CM | POA: Diagnosis not present

## 2019-07-20 DIAGNOSIS — R2689 Other abnormalities of gait and mobility: Secondary | ICD-10-CM | POA: Diagnosis not present

## 2019-07-20 DIAGNOSIS — J9601 Acute respiratory failure with hypoxia: Secondary | ICD-10-CM | POA: Diagnosis not present

## 2019-07-20 DIAGNOSIS — M6281 Muscle weakness (generalized): Secondary | ICD-10-CM | POA: Diagnosis not present

## 2019-07-20 DIAGNOSIS — L89610 Pressure ulcer of right heel, unstageable: Secondary | ICD-10-CM | POA: Diagnosis not present

## 2019-07-20 DIAGNOSIS — T17908A Unspecified foreign body in respiratory tract, part unspecified causing other injury, initial encounter: Secondary | ICD-10-CM | POA: Diagnosis not present

## 2019-07-20 DIAGNOSIS — M24561 Contracture, right knee: Secondary | ICD-10-CM | POA: Diagnosis not present

## 2019-07-20 DIAGNOSIS — R2681 Unsteadiness on feet: Secondary | ICD-10-CM | POA: Diagnosis not present

## 2019-07-20 DIAGNOSIS — J168 Pneumonia due to other specified infectious organisms: Secondary | ICD-10-CM | POA: Diagnosis not present

## 2019-07-20 DIAGNOSIS — R1312 Dysphagia, oropharyngeal phase: Secondary | ICD-10-CM | POA: Diagnosis not present

## 2019-07-20 DIAGNOSIS — L89153 Pressure ulcer of sacral region, stage 3: Secondary | ICD-10-CM | POA: Diagnosis not present

## 2019-07-20 DIAGNOSIS — R1319 Other dysphagia: Secondary | ICD-10-CM | POA: Diagnosis not present

## 2019-07-20 DIAGNOSIS — M2450 Contracture, unspecified joint: Secondary | ICD-10-CM | POA: Diagnosis not present

## 2019-07-20 DIAGNOSIS — G9341 Metabolic encephalopathy: Secondary | ICD-10-CM | POA: Diagnosis not present

## 2019-07-20 DIAGNOSIS — M24571 Contracture, right ankle: Secondary | ICD-10-CM | POA: Diagnosis not present

## 2019-07-20 DIAGNOSIS — M79661 Pain in right lower leg: Secondary | ICD-10-CM | POA: Diagnosis not present

## 2019-07-20 DIAGNOSIS — R278 Other lack of coordination: Secondary | ICD-10-CM | POA: Diagnosis not present

## 2019-07-20 DIAGNOSIS — R293 Abnormal posture: Secondary | ICD-10-CM | POA: Diagnosis not present

## 2019-07-20 DIAGNOSIS — J690 Pneumonitis due to inhalation of food and vomit: Secondary | ICD-10-CM | POA: Diagnosis not present

## 2019-07-20 DIAGNOSIS — M79662 Pain in left lower leg: Secondary | ICD-10-CM | POA: Diagnosis not present

## 2019-07-20 DIAGNOSIS — Z7409 Other reduced mobility: Secondary | ICD-10-CM | POA: Diagnosis not present

## 2019-07-21 DIAGNOSIS — M2450 Contracture, unspecified joint: Secondary | ICD-10-CM | POA: Diagnosis not present

## 2019-07-21 DIAGNOSIS — R2689 Other abnormalities of gait and mobility: Secondary | ICD-10-CM | POA: Diagnosis not present

## 2019-07-21 DIAGNOSIS — G9341 Metabolic encephalopathy: Secondary | ICD-10-CM | POA: Diagnosis not present

## 2019-07-21 DIAGNOSIS — M24571 Contracture, right ankle: Secondary | ICD-10-CM | POA: Diagnosis not present

## 2019-07-21 DIAGNOSIS — Z9181 History of falling: Secondary | ICD-10-CM | POA: Diagnosis not present

## 2019-07-21 DIAGNOSIS — R293 Abnormal posture: Secondary | ICD-10-CM | POA: Diagnosis not present

## 2019-07-21 DIAGNOSIS — R278 Other lack of coordination: Secondary | ICD-10-CM | POA: Diagnosis not present

## 2019-07-21 DIAGNOSIS — M79661 Pain in right lower leg: Secondary | ICD-10-CM | POA: Diagnosis not present

## 2019-07-21 DIAGNOSIS — M79662 Pain in left lower leg: Secondary | ICD-10-CM | POA: Diagnosis not present

## 2019-07-21 DIAGNOSIS — J168 Pneumonia due to other specified infectious organisms: Secondary | ICD-10-CM | POA: Diagnosis not present

## 2019-07-21 DIAGNOSIS — R1312 Dysphagia, oropharyngeal phase: Secondary | ICD-10-CM | POA: Diagnosis not present

## 2019-07-21 DIAGNOSIS — J9601 Acute respiratory failure with hypoxia: Secondary | ICD-10-CM | POA: Diagnosis not present

## 2019-07-21 DIAGNOSIS — J984 Other disorders of lung: Secondary | ICD-10-CM | POA: Diagnosis not present

## 2019-07-21 DIAGNOSIS — Z7409 Other reduced mobility: Secondary | ICD-10-CM | POA: Diagnosis not present

## 2019-07-21 DIAGNOSIS — M6281 Muscle weakness (generalized): Secondary | ICD-10-CM | POA: Diagnosis not present

## 2019-07-21 DIAGNOSIS — J690 Pneumonitis due to inhalation of food and vomit: Secondary | ICD-10-CM | POA: Diagnosis not present

## 2019-07-21 DIAGNOSIS — M24561 Contracture, right knee: Secondary | ICD-10-CM | POA: Diagnosis not present

## 2019-07-21 DIAGNOSIS — R1319 Other dysphagia: Secondary | ICD-10-CM | POA: Diagnosis not present

## 2019-07-21 DIAGNOSIS — R2681 Unsteadiness on feet: Secondary | ICD-10-CM | POA: Diagnosis not present

## 2019-07-21 DIAGNOSIS — I10 Essential (primary) hypertension: Secondary | ICD-10-CM | POA: Diagnosis not present

## 2019-07-22 DIAGNOSIS — R2681 Unsteadiness on feet: Secondary | ICD-10-CM | POA: Diagnosis not present

## 2019-07-22 DIAGNOSIS — Z7409 Other reduced mobility: Secondary | ICD-10-CM | POA: Diagnosis not present

## 2019-07-22 DIAGNOSIS — M2450 Contracture, unspecified joint: Secondary | ICD-10-CM | POA: Diagnosis not present

## 2019-07-22 DIAGNOSIS — R278 Other lack of coordination: Secondary | ICD-10-CM | POA: Diagnosis not present

## 2019-07-22 DIAGNOSIS — G9341 Metabolic encephalopathy: Secondary | ICD-10-CM | POA: Diagnosis not present

## 2019-07-22 DIAGNOSIS — M6281 Muscle weakness (generalized): Secondary | ICD-10-CM | POA: Diagnosis not present

## 2019-07-22 DIAGNOSIS — M24561 Contracture, right knee: Secondary | ICD-10-CM | POA: Diagnosis not present

## 2019-07-22 DIAGNOSIS — M24571 Contracture, right ankle: Secondary | ICD-10-CM | POA: Diagnosis not present

## 2019-07-22 DIAGNOSIS — R2689 Other abnormalities of gait and mobility: Secondary | ICD-10-CM | POA: Diagnosis not present

## 2019-07-22 DIAGNOSIS — J168 Pneumonia due to other specified infectious organisms: Secondary | ICD-10-CM | POA: Diagnosis not present

## 2019-07-22 DIAGNOSIS — J690 Pneumonitis due to inhalation of food and vomit: Secondary | ICD-10-CM | POA: Diagnosis not present

## 2019-07-22 DIAGNOSIS — M79661 Pain in right lower leg: Secondary | ICD-10-CM | POA: Diagnosis not present

## 2019-07-22 DIAGNOSIS — R1312 Dysphagia, oropharyngeal phase: Secondary | ICD-10-CM | POA: Diagnosis not present

## 2019-07-22 DIAGNOSIS — Z9181 History of falling: Secondary | ICD-10-CM | POA: Diagnosis not present

## 2019-07-22 DIAGNOSIS — R293 Abnormal posture: Secondary | ICD-10-CM | POA: Diagnosis not present

## 2019-07-22 DIAGNOSIS — R1319 Other dysphagia: Secondary | ICD-10-CM | POA: Diagnosis not present

## 2019-07-22 DIAGNOSIS — M79662 Pain in left lower leg: Secondary | ICD-10-CM | POA: Diagnosis not present

## 2019-07-22 DIAGNOSIS — J9601 Acute respiratory failure with hypoxia: Secondary | ICD-10-CM | POA: Diagnosis not present

## 2019-07-23 DIAGNOSIS — I10 Essential (primary) hypertension: Secondary | ICD-10-CM | POA: Diagnosis not present

## 2019-07-23 DIAGNOSIS — J9690 Respiratory failure, unspecified, unspecified whether with hypoxia or hypercapnia: Secondary | ICD-10-CM | POA: Diagnosis not present

## 2019-07-23 DIAGNOSIS — Z7409 Other reduced mobility: Secondary | ICD-10-CM | POA: Diagnosis not present

## 2019-07-23 DIAGNOSIS — G9341 Metabolic encephalopathy: Secondary | ICD-10-CM | POA: Diagnosis not present

## 2019-07-23 DIAGNOSIS — Z9181 History of falling: Secondary | ICD-10-CM | POA: Diagnosis not present

## 2019-07-23 DIAGNOSIS — M6281 Muscle weakness (generalized): Secondary | ICD-10-CM | POA: Diagnosis not present

## 2019-07-23 DIAGNOSIS — Z9114 Patient's other noncompliance with medication regimen: Secondary | ICD-10-CM | POA: Diagnosis not present

## 2019-07-23 DIAGNOSIS — J168 Pneumonia due to other specified infectious organisms: Secondary | ICD-10-CM | POA: Diagnosis not present

## 2019-07-23 DIAGNOSIS — R1319 Other dysphagia: Secondary | ICD-10-CM | POA: Diagnosis not present

## 2019-07-23 DIAGNOSIS — M24571 Contracture, right ankle: Secondary | ICD-10-CM | POA: Diagnosis not present

## 2019-07-23 DIAGNOSIS — R2689 Other abnormalities of gait and mobility: Secondary | ICD-10-CM | POA: Diagnosis not present

## 2019-07-23 DIAGNOSIS — R278 Other lack of coordination: Secondary | ICD-10-CM | POA: Diagnosis not present

## 2019-07-23 DIAGNOSIS — R1312 Dysphagia, oropharyngeal phase: Secondary | ICD-10-CM | POA: Diagnosis not present

## 2019-07-23 DIAGNOSIS — M79661 Pain in right lower leg: Secondary | ICD-10-CM | POA: Diagnosis not present

## 2019-07-23 DIAGNOSIS — J690 Pneumonitis due to inhalation of food and vomit: Secondary | ICD-10-CM | POA: Diagnosis not present

## 2019-07-23 DIAGNOSIS — M79662 Pain in left lower leg: Secondary | ICD-10-CM | POA: Diagnosis not present

## 2019-07-23 DIAGNOSIS — R293 Abnormal posture: Secondary | ICD-10-CM | POA: Diagnosis not present

## 2019-07-23 DIAGNOSIS — M24561 Contracture, right knee: Secondary | ICD-10-CM | POA: Diagnosis not present

## 2019-07-23 DIAGNOSIS — R569 Unspecified convulsions: Secondary | ICD-10-CM | POA: Diagnosis not present

## 2019-07-23 DIAGNOSIS — R2681 Unsteadiness on feet: Secondary | ICD-10-CM | POA: Diagnosis not present

## 2019-07-23 DIAGNOSIS — J9601 Acute respiratory failure with hypoxia: Secondary | ICD-10-CM | POA: Diagnosis not present

## 2019-07-23 DIAGNOSIS — M2450 Contracture, unspecified joint: Secondary | ICD-10-CM | POA: Diagnosis not present

## 2019-07-24 DIAGNOSIS — Z7409 Other reduced mobility: Secondary | ICD-10-CM | POA: Diagnosis not present

## 2019-07-24 DIAGNOSIS — R2681 Unsteadiness on feet: Secondary | ICD-10-CM | POA: Diagnosis not present

## 2019-07-24 DIAGNOSIS — M2450 Contracture, unspecified joint: Secondary | ICD-10-CM | POA: Diagnosis not present

## 2019-07-24 DIAGNOSIS — R2689 Other abnormalities of gait and mobility: Secondary | ICD-10-CM | POA: Diagnosis not present

## 2019-07-24 DIAGNOSIS — R1319 Other dysphagia: Secondary | ICD-10-CM | POA: Diagnosis not present

## 2019-07-24 DIAGNOSIS — Z9181 History of falling: Secondary | ICD-10-CM | POA: Diagnosis not present

## 2019-07-24 DIAGNOSIS — M24571 Contracture, right ankle: Secondary | ICD-10-CM | POA: Diagnosis not present

## 2019-07-24 DIAGNOSIS — J9601 Acute respiratory failure with hypoxia: Secondary | ICD-10-CM | POA: Diagnosis not present

## 2019-07-24 DIAGNOSIS — M79661 Pain in right lower leg: Secondary | ICD-10-CM | POA: Diagnosis not present

## 2019-07-24 DIAGNOSIS — R1312 Dysphagia, oropharyngeal phase: Secondary | ICD-10-CM | POA: Diagnosis not present

## 2019-07-24 DIAGNOSIS — M79662 Pain in left lower leg: Secondary | ICD-10-CM | POA: Diagnosis not present

## 2019-07-24 DIAGNOSIS — R278 Other lack of coordination: Secondary | ICD-10-CM | POA: Diagnosis not present

## 2019-07-24 DIAGNOSIS — R293 Abnormal posture: Secondary | ICD-10-CM | POA: Diagnosis not present

## 2019-07-24 DIAGNOSIS — J690 Pneumonitis due to inhalation of food and vomit: Secondary | ICD-10-CM | POA: Diagnosis not present

## 2019-07-24 DIAGNOSIS — J168 Pneumonia due to other specified infectious organisms: Secondary | ICD-10-CM | POA: Diagnosis not present

## 2019-07-24 DIAGNOSIS — M24561 Contracture, right knee: Secondary | ICD-10-CM | POA: Diagnosis not present

## 2019-07-24 DIAGNOSIS — M6281 Muscle weakness (generalized): Secondary | ICD-10-CM | POA: Diagnosis not present

## 2019-07-24 DIAGNOSIS — G9341 Metabolic encephalopathy: Secondary | ICD-10-CM | POA: Diagnosis not present

## 2019-07-25 DIAGNOSIS — M2450 Contracture, unspecified joint: Secondary | ICD-10-CM | POA: Diagnosis not present

## 2019-07-25 DIAGNOSIS — R2681 Unsteadiness on feet: Secondary | ICD-10-CM | POA: Diagnosis not present

## 2019-07-25 DIAGNOSIS — R278 Other lack of coordination: Secondary | ICD-10-CM | POA: Diagnosis not present

## 2019-07-25 DIAGNOSIS — Z7409 Other reduced mobility: Secondary | ICD-10-CM | POA: Diagnosis not present

## 2019-07-25 DIAGNOSIS — M24571 Contracture, right ankle: Secondary | ICD-10-CM | POA: Diagnosis not present

## 2019-07-25 DIAGNOSIS — M79661 Pain in right lower leg: Secondary | ICD-10-CM | POA: Diagnosis not present

## 2019-07-25 DIAGNOSIS — G9341 Metabolic encephalopathy: Secondary | ICD-10-CM | POA: Diagnosis not present

## 2019-07-25 DIAGNOSIS — M24561 Contracture, right knee: Secondary | ICD-10-CM | POA: Diagnosis not present

## 2019-07-25 DIAGNOSIS — R1312 Dysphagia, oropharyngeal phase: Secondary | ICD-10-CM | POA: Diagnosis not present

## 2019-07-25 DIAGNOSIS — J9601 Acute respiratory failure with hypoxia: Secondary | ICD-10-CM | POA: Diagnosis not present

## 2019-07-25 DIAGNOSIS — J168 Pneumonia due to other specified infectious organisms: Secondary | ICD-10-CM | POA: Diagnosis not present

## 2019-07-25 DIAGNOSIS — R2689 Other abnormalities of gait and mobility: Secondary | ICD-10-CM | POA: Diagnosis not present

## 2019-07-25 DIAGNOSIS — R293 Abnormal posture: Secondary | ICD-10-CM | POA: Diagnosis not present

## 2019-07-25 DIAGNOSIS — R1319 Other dysphagia: Secondary | ICD-10-CM | POA: Diagnosis not present

## 2019-07-25 DIAGNOSIS — Z9181 History of falling: Secondary | ICD-10-CM | POA: Diagnosis not present

## 2019-07-25 DIAGNOSIS — M79662 Pain in left lower leg: Secondary | ICD-10-CM | POA: Diagnosis not present

## 2019-07-25 DIAGNOSIS — M6281 Muscle weakness (generalized): Secondary | ICD-10-CM | POA: Diagnosis not present

## 2019-07-25 DIAGNOSIS — J690 Pneumonitis due to inhalation of food and vomit: Secondary | ICD-10-CM | POA: Diagnosis not present

## 2019-07-26 DIAGNOSIS — Z7409 Other reduced mobility: Secondary | ICD-10-CM | POA: Diagnosis not present

## 2019-07-26 DIAGNOSIS — M6281 Muscle weakness (generalized): Secondary | ICD-10-CM | POA: Diagnosis not present

## 2019-07-26 DIAGNOSIS — R2681 Unsteadiness on feet: Secondary | ICD-10-CM | POA: Diagnosis not present

## 2019-07-26 DIAGNOSIS — M79661 Pain in right lower leg: Secondary | ICD-10-CM | POA: Diagnosis not present

## 2019-07-26 DIAGNOSIS — M2450 Contracture, unspecified joint: Secondary | ICD-10-CM | POA: Diagnosis not present

## 2019-07-26 DIAGNOSIS — G9341 Metabolic encephalopathy: Secondary | ICD-10-CM | POA: Diagnosis not present

## 2019-07-26 DIAGNOSIS — R278 Other lack of coordination: Secondary | ICD-10-CM | POA: Diagnosis not present

## 2019-07-26 DIAGNOSIS — R1312 Dysphagia, oropharyngeal phase: Secondary | ICD-10-CM | POA: Diagnosis not present

## 2019-07-26 DIAGNOSIS — Z9181 History of falling: Secondary | ICD-10-CM | POA: Diagnosis not present

## 2019-07-26 DIAGNOSIS — R293 Abnormal posture: Secondary | ICD-10-CM | POA: Diagnosis not present

## 2019-07-26 DIAGNOSIS — J9601 Acute respiratory failure with hypoxia: Secondary | ICD-10-CM | POA: Diagnosis not present

## 2019-07-26 DIAGNOSIS — M24571 Contracture, right ankle: Secondary | ICD-10-CM | POA: Diagnosis not present

## 2019-07-26 DIAGNOSIS — J690 Pneumonitis due to inhalation of food and vomit: Secondary | ICD-10-CM | POA: Diagnosis not present

## 2019-07-26 DIAGNOSIS — R1319 Other dysphagia: Secondary | ICD-10-CM | POA: Diagnosis not present

## 2019-07-26 DIAGNOSIS — R2689 Other abnormalities of gait and mobility: Secondary | ICD-10-CM | POA: Diagnosis not present

## 2019-07-26 DIAGNOSIS — M24561 Contracture, right knee: Secondary | ICD-10-CM | POA: Diagnosis not present

## 2019-07-26 DIAGNOSIS — M79662 Pain in left lower leg: Secondary | ICD-10-CM | POA: Diagnosis not present

## 2019-07-26 DIAGNOSIS — J168 Pneumonia due to other specified infectious organisms: Secondary | ICD-10-CM | POA: Diagnosis not present

## 2019-07-27 DIAGNOSIS — R1319 Other dysphagia: Secondary | ICD-10-CM | POA: Diagnosis not present

## 2019-07-27 DIAGNOSIS — M24571 Contracture, right ankle: Secondary | ICD-10-CM | POA: Diagnosis not present

## 2019-07-27 DIAGNOSIS — R2681 Unsteadiness on feet: Secondary | ICD-10-CM | POA: Diagnosis not present

## 2019-07-27 DIAGNOSIS — M24561 Contracture, right knee: Secondary | ICD-10-CM | POA: Diagnosis not present

## 2019-07-27 DIAGNOSIS — Z7409 Other reduced mobility: Secondary | ICD-10-CM | POA: Diagnosis not present

## 2019-07-27 DIAGNOSIS — R1312 Dysphagia, oropharyngeal phase: Secondary | ICD-10-CM | POA: Diagnosis not present

## 2019-07-27 DIAGNOSIS — J690 Pneumonitis due to inhalation of food and vomit: Secondary | ICD-10-CM | POA: Diagnosis not present

## 2019-07-27 DIAGNOSIS — L89153 Pressure ulcer of sacral region, stage 3: Secondary | ICD-10-CM | POA: Diagnosis not present

## 2019-07-27 DIAGNOSIS — R278 Other lack of coordination: Secondary | ICD-10-CM | POA: Diagnosis not present

## 2019-07-27 DIAGNOSIS — R2689 Other abnormalities of gait and mobility: Secondary | ICD-10-CM | POA: Diagnosis not present

## 2019-07-27 DIAGNOSIS — J168 Pneumonia due to other specified infectious organisms: Secondary | ICD-10-CM | POA: Diagnosis not present

## 2019-07-27 DIAGNOSIS — G9341 Metabolic encephalopathy: Secondary | ICD-10-CM | POA: Diagnosis not present

## 2019-07-27 DIAGNOSIS — R293 Abnormal posture: Secondary | ICD-10-CM | POA: Diagnosis not present

## 2019-07-27 DIAGNOSIS — L89610 Pressure ulcer of right heel, unstageable: Secondary | ICD-10-CM | POA: Diagnosis not present

## 2019-07-27 DIAGNOSIS — M2450 Contracture, unspecified joint: Secondary | ICD-10-CM | POA: Diagnosis not present

## 2019-07-27 DIAGNOSIS — M79661 Pain in right lower leg: Secondary | ICD-10-CM | POA: Diagnosis not present

## 2019-07-27 DIAGNOSIS — M79662 Pain in left lower leg: Secondary | ICD-10-CM | POA: Diagnosis not present

## 2019-07-27 DIAGNOSIS — M6281 Muscle weakness (generalized): Secondary | ICD-10-CM | POA: Diagnosis not present

## 2019-07-27 DIAGNOSIS — J9601 Acute respiratory failure with hypoxia: Secondary | ICD-10-CM | POA: Diagnosis not present

## 2019-07-27 DIAGNOSIS — Z9181 History of falling: Secondary | ICD-10-CM | POA: Diagnosis not present

## 2019-07-28 DIAGNOSIS — J690 Pneumonitis due to inhalation of food and vomit: Secondary | ICD-10-CM | POA: Diagnosis not present

## 2019-07-28 DIAGNOSIS — R293 Abnormal posture: Secondary | ICD-10-CM | POA: Diagnosis not present

## 2019-07-28 DIAGNOSIS — R1312 Dysphagia, oropharyngeal phase: Secondary | ICD-10-CM | POA: Diagnosis not present

## 2019-07-28 DIAGNOSIS — G9341 Metabolic encephalopathy: Secondary | ICD-10-CM | POA: Diagnosis not present

## 2019-07-28 DIAGNOSIS — M79661 Pain in right lower leg: Secondary | ICD-10-CM | POA: Diagnosis not present

## 2019-07-28 DIAGNOSIS — J168 Pneumonia due to other specified infectious organisms: Secondary | ICD-10-CM | POA: Diagnosis not present

## 2019-07-28 DIAGNOSIS — M24571 Contracture, right ankle: Secondary | ICD-10-CM | POA: Diagnosis not present

## 2019-07-28 DIAGNOSIS — M6281 Muscle weakness (generalized): Secondary | ICD-10-CM | POA: Diagnosis not present

## 2019-07-28 DIAGNOSIS — R2689 Other abnormalities of gait and mobility: Secondary | ICD-10-CM | POA: Diagnosis not present

## 2019-07-28 DIAGNOSIS — Z7409 Other reduced mobility: Secondary | ICD-10-CM | POA: Diagnosis not present

## 2019-07-28 DIAGNOSIS — R2681 Unsteadiness on feet: Secondary | ICD-10-CM | POA: Diagnosis not present

## 2019-07-28 DIAGNOSIS — M2450 Contracture, unspecified joint: Secondary | ICD-10-CM | POA: Diagnosis not present

## 2019-07-28 DIAGNOSIS — Z9181 History of falling: Secondary | ICD-10-CM | POA: Diagnosis not present

## 2019-07-28 DIAGNOSIS — J9601 Acute respiratory failure with hypoxia: Secondary | ICD-10-CM | POA: Diagnosis not present

## 2019-07-28 DIAGNOSIS — R1319 Other dysphagia: Secondary | ICD-10-CM | POA: Diagnosis not present

## 2019-07-28 DIAGNOSIS — M24561 Contracture, right knee: Secondary | ICD-10-CM | POA: Diagnosis not present

## 2019-07-28 DIAGNOSIS — R278 Other lack of coordination: Secondary | ICD-10-CM | POA: Diagnosis not present

## 2019-07-28 DIAGNOSIS — M79662 Pain in left lower leg: Secondary | ICD-10-CM | POA: Diagnosis not present

## 2019-07-29 DIAGNOSIS — M24561 Contracture, right knee: Secondary | ICD-10-CM | POA: Diagnosis not present

## 2019-07-29 DIAGNOSIS — R1319 Other dysphagia: Secondary | ICD-10-CM | POA: Diagnosis not present

## 2019-07-29 DIAGNOSIS — J168 Pneumonia due to other specified infectious organisms: Secondary | ICD-10-CM | POA: Diagnosis not present

## 2019-07-29 DIAGNOSIS — G9341 Metabolic encephalopathy: Secondary | ICD-10-CM | POA: Diagnosis not present

## 2019-07-29 DIAGNOSIS — Z7409 Other reduced mobility: Secondary | ICD-10-CM | POA: Diagnosis not present

## 2019-07-29 DIAGNOSIS — R278 Other lack of coordination: Secondary | ICD-10-CM | POA: Diagnosis not present

## 2019-07-29 DIAGNOSIS — R2681 Unsteadiness on feet: Secondary | ICD-10-CM | POA: Diagnosis not present

## 2019-07-29 DIAGNOSIS — M2450 Contracture, unspecified joint: Secondary | ICD-10-CM | POA: Diagnosis not present

## 2019-07-29 DIAGNOSIS — J690 Pneumonitis due to inhalation of food and vomit: Secondary | ICD-10-CM | POA: Diagnosis not present

## 2019-07-29 DIAGNOSIS — Z9181 History of falling: Secondary | ICD-10-CM | POA: Diagnosis not present

## 2019-07-29 DIAGNOSIS — J9601 Acute respiratory failure with hypoxia: Secondary | ICD-10-CM | POA: Diagnosis not present

## 2019-07-29 DIAGNOSIS — M6281 Muscle weakness (generalized): Secondary | ICD-10-CM | POA: Diagnosis not present

## 2019-07-29 DIAGNOSIS — R1312 Dysphagia, oropharyngeal phase: Secondary | ICD-10-CM | POA: Diagnosis not present

## 2019-07-29 DIAGNOSIS — R293 Abnormal posture: Secondary | ICD-10-CM | POA: Diagnosis not present

## 2019-07-29 DIAGNOSIS — M24571 Contracture, right ankle: Secondary | ICD-10-CM | POA: Diagnosis not present

## 2019-07-29 DIAGNOSIS — R2689 Other abnormalities of gait and mobility: Secondary | ICD-10-CM | POA: Diagnosis not present

## 2019-07-29 DIAGNOSIS — M79661 Pain in right lower leg: Secondary | ICD-10-CM | POA: Diagnosis not present

## 2019-07-29 DIAGNOSIS — M79662 Pain in left lower leg: Secondary | ICD-10-CM | POA: Diagnosis not present

## 2019-07-30 DIAGNOSIS — M24561 Contracture, right knee: Secondary | ICD-10-CM | POA: Diagnosis not present

## 2019-07-30 DIAGNOSIS — J690 Pneumonitis due to inhalation of food and vomit: Secondary | ICD-10-CM | POA: Diagnosis not present

## 2019-07-30 DIAGNOSIS — M79661 Pain in right lower leg: Secondary | ICD-10-CM | POA: Diagnosis not present

## 2019-07-30 DIAGNOSIS — M24571 Contracture, right ankle: Secondary | ICD-10-CM | POA: Diagnosis not present

## 2019-07-30 DIAGNOSIS — M6281 Muscle weakness (generalized): Secondary | ICD-10-CM | POA: Diagnosis not present

## 2019-07-30 DIAGNOSIS — R293 Abnormal posture: Secondary | ICD-10-CM | POA: Diagnosis not present

## 2019-07-30 DIAGNOSIS — R1312 Dysphagia, oropharyngeal phase: Secondary | ICD-10-CM | POA: Diagnosis not present

## 2019-07-30 DIAGNOSIS — R2681 Unsteadiness on feet: Secondary | ICD-10-CM | POA: Diagnosis not present

## 2019-07-30 DIAGNOSIS — R1319 Other dysphagia: Secondary | ICD-10-CM | POA: Diagnosis not present

## 2019-07-30 DIAGNOSIS — L89610 Pressure ulcer of right heel, unstageable: Secondary | ICD-10-CM | POA: Diagnosis not present

## 2019-07-30 DIAGNOSIS — R2689 Other abnormalities of gait and mobility: Secondary | ICD-10-CM | POA: Diagnosis not present

## 2019-07-30 DIAGNOSIS — Z7409 Other reduced mobility: Secondary | ICD-10-CM | POA: Diagnosis not present

## 2019-07-30 DIAGNOSIS — M2450 Contracture, unspecified joint: Secondary | ICD-10-CM | POA: Diagnosis not present

## 2019-07-30 DIAGNOSIS — L89153 Pressure ulcer of sacral region, stage 3: Secondary | ICD-10-CM | POA: Diagnosis not present

## 2019-07-30 DIAGNOSIS — J9601 Acute respiratory failure with hypoxia: Secondary | ICD-10-CM | POA: Diagnosis not present

## 2019-07-30 DIAGNOSIS — Z9181 History of falling: Secondary | ICD-10-CM | POA: Diagnosis not present

## 2019-07-30 DIAGNOSIS — J168 Pneumonia due to other specified infectious organisms: Secondary | ICD-10-CM | POA: Diagnosis not present

## 2019-07-30 DIAGNOSIS — M79662 Pain in left lower leg: Secondary | ICD-10-CM | POA: Diagnosis not present

## 2019-07-30 DIAGNOSIS — G9341 Metabolic encephalopathy: Secondary | ICD-10-CM | POA: Diagnosis not present

## 2019-07-30 DIAGNOSIS — R278 Other lack of coordination: Secondary | ICD-10-CM | POA: Diagnosis not present

## 2019-07-31 DIAGNOSIS — R2681 Unsteadiness on feet: Secondary | ICD-10-CM | POA: Diagnosis not present

## 2019-07-31 DIAGNOSIS — G9341 Metabolic encephalopathy: Secondary | ICD-10-CM | POA: Diagnosis not present

## 2019-07-31 DIAGNOSIS — R278 Other lack of coordination: Secondary | ICD-10-CM | POA: Diagnosis not present

## 2019-07-31 DIAGNOSIS — M79661 Pain in right lower leg: Secondary | ICD-10-CM | POA: Diagnosis not present

## 2019-07-31 DIAGNOSIS — M2450 Contracture, unspecified joint: Secondary | ICD-10-CM | POA: Diagnosis not present

## 2019-07-31 DIAGNOSIS — Z9181 History of falling: Secondary | ICD-10-CM | POA: Diagnosis not present

## 2019-07-31 DIAGNOSIS — Z7409 Other reduced mobility: Secondary | ICD-10-CM | POA: Diagnosis not present

## 2019-07-31 DIAGNOSIS — J168 Pneumonia due to other specified infectious organisms: Secondary | ICD-10-CM | POA: Diagnosis not present

## 2019-07-31 DIAGNOSIS — R1319 Other dysphagia: Secondary | ICD-10-CM | POA: Diagnosis not present

## 2019-07-31 DIAGNOSIS — J9601 Acute respiratory failure with hypoxia: Secondary | ICD-10-CM | POA: Diagnosis not present

## 2019-07-31 DIAGNOSIS — M24571 Contracture, right ankle: Secondary | ICD-10-CM | POA: Diagnosis not present

## 2019-07-31 DIAGNOSIS — M79662 Pain in left lower leg: Secondary | ICD-10-CM | POA: Diagnosis not present

## 2019-07-31 DIAGNOSIS — R293 Abnormal posture: Secondary | ICD-10-CM | POA: Diagnosis not present

## 2019-07-31 DIAGNOSIS — M6281 Muscle weakness (generalized): Secondary | ICD-10-CM | POA: Diagnosis not present

## 2019-07-31 DIAGNOSIS — J690 Pneumonitis due to inhalation of food and vomit: Secondary | ICD-10-CM | POA: Diagnosis not present

## 2019-07-31 DIAGNOSIS — M24561 Contracture, right knee: Secondary | ICD-10-CM | POA: Diagnosis not present

## 2019-07-31 DIAGNOSIS — R2689 Other abnormalities of gait and mobility: Secondary | ICD-10-CM | POA: Diagnosis not present

## 2019-07-31 DIAGNOSIS — R569 Unspecified convulsions: Secondary | ICD-10-CM | POA: Diagnosis not present

## 2019-07-31 DIAGNOSIS — R1312 Dysphagia, oropharyngeal phase: Secondary | ICD-10-CM | POA: Diagnosis not present

## 2019-08-01 DIAGNOSIS — R1312 Dysphagia, oropharyngeal phase: Secondary | ICD-10-CM | POA: Diagnosis not present

## 2019-08-01 DIAGNOSIS — R278 Other lack of coordination: Secondary | ICD-10-CM | POA: Diagnosis not present

## 2019-08-01 DIAGNOSIS — J168 Pneumonia due to other specified infectious organisms: Secondary | ICD-10-CM | POA: Diagnosis not present

## 2019-08-01 DIAGNOSIS — M6281 Muscle weakness (generalized): Secondary | ICD-10-CM | POA: Diagnosis not present

## 2019-08-01 DIAGNOSIS — R2681 Unsteadiness on feet: Secondary | ICD-10-CM | POA: Diagnosis not present

## 2019-08-01 DIAGNOSIS — M79662 Pain in left lower leg: Secondary | ICD-10-CM | POA: Diagnosis not present

## 2019-08-01 DIAGNOSIS — Z9181 History of falling: Secondary | ICD-10-CM | POA: Diagnosis not present

## 2019-08-01 DIAGNOSIS — M79661 Pain in right lower leg: Secondary | ICD-10-CM | POA: Diagnosis not present

## 2019-08-01 DIAGNOSIS — M24571 Contracture, right ankle: Secondary | ICD-10-CM | POA: Diagnosis not present

## 2019-08-01 DIAGNOSIS — G9341 Metabolic encephalopathy: Secondary | ICD-10-CM | POA: Diagnosis not present

## 2019-08-01 DIAGNOSIS — R2689 Other abnormalities of gait and mobility: Secondary | ICD-10-CM | POA: Diagnosis not present

## 2019-08-01 DIAGNOSIS — R1319 Other dysphagia: Secondary | ICD-10-CM | POA: Diagnosis not present

## 2019-08-01 DIAGNOSIS — R293 Abnormal posture: Secondary | ICD-10-CM | POA: Diagnosis not present

## 2019-08-01 DIAGNOSIS — Z7409 Other reduced mobility: Secondary | ICD-10-CM | POA: Diagnosis not present

## 2019-08-01 DIAGNOSIS — J690 Pneumonitis due to inhalation of food and vomit: Secondary | ICD-10-CM | POA: Diagnosis not present

## 2019-08-01 DIAGNOSIS — M24561 Contracture, right knee: Secondary | ICD-10-CM | POA: Diagnosis not present

## 2019-08-01 DIAGNOSIS — J9601 Acute respiratory failure with hypoxia: Secondary | ICD-10-CM | POA: Diagnosis not present

## 2019-08-01 DIAGNOSIS — M2450 Contracture, unspecified joint: Secondary | ICD-10-CM | POA: Diagnosis not present

## 2019-08-02 DIAGNOSIS — M79661 Pain in right lower leg: Secondary | ICD-10-CM | POA: Diagnosis not present

## 2019-08-02 DIAGNOSIS — M79662 Pain in left lower leg: Secondary | ICD-10-CM | POA: Diagnosis not present

## 2019-08-02 DIAGNOSIS — M24571 Contracture, right ankle: Secondary | ICD-10-CM | POA: Diagnosis not present

## 2019-08-02 DIAGNOSIS — R278 Other lack of coordination: Secondary | ICD-10-CM | POA: Diagnosis not present

## 2019-08-02 DIAGNOSIS — R293 Abnormal posture: Secondary | ICD-10-CM | POA: Diagnosis not present

## 2019-08-02 DIAGNOSIS — R1312 Dysphagia, oropharyngeal phase: Secondary | ICD-10-CM | POA: Diagnosis not present

## 2019-08-02 DIAGNOSIS — J9601 Acute respiratory failure with hypoxia: Secondary | ICD-10-CM | POA: Diagnosis not present

## 2019-08-02 DIAGNOSIS — R2689 Other abnormalities of gait and mobility: Secondary | ICD-10-CM | POA: Diagnosis not present

## 2019-08-02 DIAGNOSIS — Z9181 History of falling: Secondary | ICD-10-CM | POA: Diagnosis not present

## 2019-08-02 DIAGNOSIS — R2681 Unsteadiness on feet: Secondary | ICD-10-CM | POA: Diagnosis not present

## 2019-08-02 DIAGNOSIS — M24561 Contracture, right knee: Secondary | ICD-10-CM | POA: Diagnosis not present

## 2019-08-02 DIAGNOSIS — J690 Pneumonitis due to inhalation of food and vomit: Secondary | ICD-10-CM | POA: Diagnosis not present

## 2019-08-02 DIAGNOSIS — R1319 Other dysphagia: Secondary | ICD-10-CM | POA: Diagnosis not present

## 2019-08-02 DIAGNOSIS — M2450 Contracture, unspecified joint: Secondary | ICD-10-CM | POA: Diagnosis not present

## 2019-08-02 DIAGNOSIS — G9341 Metabolic encephalopathy: Secondary | ICD-10-CM | POA: Diagnosis not present

## 2019-08-02 DIAGNOSIS — M6281 Muscle weakness (generalized): Secondary | ICD-10-CM | POA: Diagnosis not present

## 2019-08-02 DIAGNOSIS — J168 Pneumonia due to other specified infectious organisms: Secondary | ICD-10-CM | POA: Diagnosis not present

## 2019-08-02 DIAGNOSIS — Z7409 Other reduced mobility: Secondary | ICD-10-CM | POA: Diagnosis not present

## 2019-08-03 DIAGNOSIS — M6281 Muscle weakness (generalized): Secondary | ICD-10-CM | POA: Diagnosis not present

## 2019-08-03 DIAGNOSIS — J690 Pneumonitis due to inhalation of food and vomit: Secondary | ICD-10-CM | POA: Diagnosis not present

## 2019-08-03 DIAGNOSIS — R293 Abnormal posture: Secondary | ICD-10-CM | POA: Diagnosis not present

## 2019-08-03 DIAGNOSIS — M2450 Contracture, unspecified joint: Secondary | ICD-10-CM | POA: Diagnosis not present

## 2019-08-03 DIAGNOSIS — Z7409 Other reduced mobility: Secondary | ICD-10-CM | POA: Diagnosis not present

## 2019-08-03 DIAGNOSIS — J168 Pneumonia due to other specified infectious organisms: Secondary | ICD-10-CM | POA: Diagnosis not present

## 2019-08-03 DIAGNOSIS — Z9181 History of falling: Secondary | ICD-10-CM | POA: Diagnosis not present

## 2019-08-03 DIAGNOSIS — M79662 Pain in left lower leg: Secondary | ICD-10-CM | POA: Diagnosis not present

## 2019-08-03 DIAGNOSIS — M24571 Contracture, right ankle: Secondary | ICD-10-CM | POA: Diagnosis not present

## 2019-08-03 DIAGNOSIS — R2689 Other abnormalities of gait and mobility: Secondary | ICD-10-CM | POA: Diagnosis not present

## 2019-08-03 DIAGNOSIS — R2681 Unsteadiness on feet: Secondary | ICD-10-CM | POA: Diagnosis not present

## 2019-08-03 DIAGNOSIS — L89610 Pressure ulcer of right heel, unstageable: Secondary | ICD-10-CM | POA: Diagnosis not present

## 2019-08-03 DIAGNOSIS — M79661 Pain in right lower leg: Secondary | ICD-10-CM | POA: Diagnosis not present

## 2019-08-03 DIAGNOSIS — J9601 Acute respiratory failure with hypoxia: Secondary | ICD-10-CM | POA: Diagnosis not present

## 2019-08-03 DIAGNOSIS — R278 Other lack of coordination: Secondary | ICD-10-CM | POA: Diagnosis not present

## 2019-08-03 DIAGNOSIS — M24561 Contracture, right knee: Secondary | ICD-10-CM | POA: Diagnosis not present

## 2019-08-03 DIAGNOSIS — L89153 Pressure ulcer of sacral region, stage 3: Secondary | ICD-10-CM | POA: Diagnosis not present

## 2019-08-03 DIAGNOSIS — R1312 Dysphagia, oropharyngeal phase: Secondary | ICD-10-CM | POA: Diagnosis not present

## 2019-08-03 DIAGNOSIS — G9341 Metabolic encephalopathy: Secondary | ICD-10-CM | POA: Diagnosis not present

## 2019-08-03 DIAGNOSIS — R1319 Other dysphagia: Secondary | ICD-10-CM | POA: Diagnosis not present

## 2019-08-04 DIAGNOSIS — G9341 Metabolic encephalopathy: Secondary | ICD-10-CM | POA: Diagnosis not present

## 2019-08-04 DIAGNOSIS — R278 Other lack of coordination: Secondary | ICD-10-CM | POA: Diagnosis not present

## 2019-08-04 DIAGNOSIS — M24561 Contracture, right knee: Secondary | ICD-10-CM | POA: Diagnosis not present

## 2019-08-04 DIAGNOSIS — J168 Pneumonia due to other specified infectious organisms: Secondary | ICD-10-CM | POA: Diagnosis not present

## 2019-08-04 DIAGNOSIS — J9601 Acute respiratory failure with hypoxia: Secondary | ICD-10-CM | POA: Diagnosis not present

## 2019-08-04 DIAGNOSIS — Z7409 Other reduced mobility: Secondary | ICD-10-CM | POA: Diagnosis not present

## 2019-08-04 DIAGNOSIS — Z9181 History of falling: Secondary | ICD-10-CM | POA: Diagnosis not present

## 2019-08-04 DIAGNOSIS — R1319 Other dysphagia: Secondary | ICD-10-CM | POA: Diagnosis not present

## 2019-08-04 DIAGNOSIS — M79661 Pain in right lower leg: Secondary | ICD-10-CM | POA: Diagnosis not present

## 2019-08-04 DIAGNOSIS — M6281 Muscle weakness (generalized): Secondary | ICD-10-CM | POA: Diagnosis not present

## 2019-08-04 DIAGNOSIS — M24571 Contracture, right ankle: Secondary | ICD-10-CM | POA: Diagnosis not present

## 2019-08-04 DIAGNOSIS — M2450 Contracture, unspecified joint: Secondary | ICD-10-CM | POA: Diagnosis not present

## 2019-08-04 DIAGNOSIS — R2681 Unsteadiness on feet: Secondary | ICD-10-CM | POA: Diagnosis not present

## 2019-08-04 DIAGNOSIS — R1312 Dysphagia, oropharyngeal phase: Secondary | ICD-10-CM | POA: Diagnosis not present

## 2019-08-04 DIAGNOSIS — R293 Abnormal posture: Secondary | ICD-10-CM | POA: Diagnosis not present

## 2019-08-04 DIAGNOSIS — R2689 Other abnormalities of gait and mobility: Secondary | ICD-10-CM | POA: Diagnosis not present

## 2019-08-04 DIAGNOSIS — J690 Pneumonitis due to inhalation of food and vomit: Secondary | ICD-10-CM | POA: Diagnosis not present

## 2019-08-04 DIAGNOSIS — M79662 Pain in left lower leg: Secondary | ICD-10-CM | POA: Diagnosis not present

## 2019-08-05 DIAGNOSIS — R293 Abnormal posture: Secondary | ICD-10-CM | POA: Diagnosis not present

## 2019-08-05 DIAGNOSIS — M79662 Pain in left lower leg: Secondary | ICD-10-CM | POA: Diagnosis not present

## 2019-08-05 DIAGNOSIS — R1319 Other dysphagia: Secondary | ICD-10-CM | POA: Diagnosis not present

## 2019-08-05 DIAGNOSIS — R2681 Unsteadiness on feet: Secondary | ICD-10-CM | POA: Diagnosis not present

## 2019-08-05 DIAGNOSIS — M79661 Pain in right lower leg: Secondary | ICD-10-CM | POA: Diagnosis not present

## 2019-08-05 DIAGNOSIS — M6281 Muscle weakness (generalized): Secondary | ICD-10-CM | POA: Diagnosis not present

## 2019-08-05 DIAGNOSIS — J690 Pneumonitis due to inhalation of food and vomit: Secondary | ICD-10-CM | POA: Diagnosis not present

## 2019-08-05 DIAGNOSIS — Z9181 History of falling: Secondary | ICD-10-CM | POA: Diagnosis not present

## 2019-08-05 DIAGNOSIS — R278 Other lack of coordination: Secondary | ICD-10-CM | POA: Diagnosis not present

## 2019-08-05 DIAGNOSIS — Z7409 Other reduced mobility: Secondary | ICD-10-CM | POA: Diagnosis not present

## 2019-08-05 DIAGNOSIS — R2689 Other abnormalities of gait and mobility: Secondary | ICD-10-CM | POA: Diagnosis not present

## 2019-08-05 DIAGNOSIS — M24561 Contracture, right knee: Secondary | ICD-10-CM | POA: Diagnosis not present

## 2019-08-05 DIAGNOSIS — M24571 Contracture, right ankle: Secondary | ICD-10-CM | POA: Diagnosis not present

## 2019-08-05 DIAGNOSIS — M2450 Contracture, unspecified joint: Secondary | ICD-10-CM | POA: Diagnosis not present

## 2019-08-05 DIAGNOSIS — J9601 Acute respiratory failure with hypoxia: Secondary | ICD-10-CM | POA: Diagnosis not present

## 2019-08-05 DIAGNOSIS — J168 Pneumonia due to other specified infectious organisms: Secondary | ICD-10-CM | POA: Diagnosis not present

## 2019-08-05 DIAGNOSIS — R1312 Dysphagia, oropharyngeal phase: Secondary | ICD-10-CM | POA: Diagnosis not present

## 2019-08-05 DIAGNOSIS — G9341 Metabolic encephalopathy: Secondary | ICD-10-CM | POA: Diagnosis not present

## 2019-08-06 DIAGNOSIS — J9601 Acute respiratory failure with hypoxia: Secondary | ICD-10-CM | POA: Diagnosis not present

## 2019-08-06 DIAGNOSIS — J690 Pneumonitis due to inhalation of food and vomit: Secondary | ICD-10-CM | POA: Diagnosis not present

## 2019-08-06 DIAGNOSIS — M24571 Contracture, right ankle: Secondary | ICD-10-CM | POA: Diagnosis not present

## 2019-08-06 DIAGNOSIS — Z7409 Other reduced mobility: Secondary | ICD-10-CM | POA: Diagnosis not present

## 2019-08-06 DIAGNOSIS — R1319 Other dysphagia: Secondary | ICD-10-CM | POA: Diagnosis not present

## 2019-08-06 DIAGNOSIS — G9341 Metabolic encephalopathy: Secondary | ICD-10-CM | POA: Diagnosis not present

## 2019-08-06 DIAGNOSIS — J168 Pneumonia due to other specified infectious organisms: Secondary | ICD-10-CM | POA: Diagnosis not present

## 2019-08-06 DIAGNOSIS — R278 Other lack of coordination: Secondary | ICD-10-CM | POA: Diagnosis not present

## 2019-08-06 DIAGNOSIS — Z9181 History of falling: Secondary | ICD-10-CM | POA: Diagnosis not present

## 2019-08-06 DIAGNOSIS — M24561 Contracture, right knee: Secondary | ICD-10-CM | POA: Diagnosis not present

## 2019-08-06 DIAGNOSIS — R293 Abnormal posture: Secondary | ICD-10-CM | POA: Diagnosis not present

## 2019-08-06 DIAGNOSIS — R2681 Unsteadiness on feet: Secondary | ICD-10-CM | POA: Diagnosis not present

## 2019-08-06 DIAGNOSIS — M79662 Pain in left lower leg: Secondary | ICD-10-CM | POA: Diagnosis not present

## 2019-08-06 DIAGNOSIS — R2689 Other abnormalities of gait and mobility: Secondary | ICD-10-CM | POA: Diagnosis not present

## 2019-08-06 DIAGNOSIS — M6281 Muscle weakness (generalized): Secondary | ICD-10-CM | POA: Diagnosis not present

## 2019-08-06 DIAGNOSIS — M79661 Pain in right lower leg: Secondary | ICD-10-CM | POA: Diagnosis not present

## 2019-08-06 DIAGNOSIS — R1312 Dysphagia, oropharyngeal phase: Secondary | ICD-10-CM | POA: Diagnosis not present

## 2019-08-06 DIAGNOSIS — M2450 Contracture, unspecified joint: Secondary | ICD-10-CM | POA: Diagnosis not present

## 2019-08-06 DIAGNOSIS — R627 Adult failure to thrive: Secondary | ICD-10-CM | POA: Diagnosis not present

## 2019-08-07 DIAGNOSIS — M79662 Pain in left lower leg: Secondary | ICD-10-CM | POA: Diagnosis not present

## 2019-08-07 DIAGNOSIS — R278 Other lack of coordination: Secondary | ICD-10-CM | POA: Diagnosis not present

## 2019-08-07 DIAGNOSIS — M24561 Contracture, right knee: Secondary | ICD-10-CM | POA: Diagnosis not present

## 2019-08-07 DIAGNOSIS — J690 Pneumonitis due to inhalation of food and vomit: Secondary | ICD-10-CM | POA: Diagnosis not present

## 2019-08-07 DIAGNOSIS — M6281 Muscle weakness (generalized): Secondary | ICD-10-CM | POA: Diagnosis not present

## 2019-08-07 DIAGNOSIS — M24571 Contracture, right ankle: Secondary | ICD-10-CM | POA: Diagnosis not present

## 2019-08-07 DIAGNOSIS — G9341 Metabolic encephalopathy: Secondary | ICD-10-CM | POA: Diagnosis not present

## 2019-08-07 DIAGNOSIS — Z9181 History of falling: Secondary | ICD-10-CM | POA: Diagnosis not present

## 2019-08-07 DIAGNOSIS — R293 Abnormal posture: Secondary | ICD-10-CM | POA: Diagnosis not present

## 2019-08-07 DIAGNOSIS — J168 Pneumonia due to other specified infectious organisms: Secondary | ICD-10-CM | POA: Diagnosis not present

## 2019-08-07 DIAGNOSIS — R2689 Other abnormalities of gait and mobility: Secondary | ICD-10-CM | POA: Diagnosis not present

## 2019-08-07 DIAGNOSIS — J9601 Acute respiratory failure with hypoxia: Secondary | ICD-10-CM | POA: Diagnosis not present

## 2019-08-07 DIAGNOSIS — R2681 Unsteadiness on feet: Secondary | ICD-10-CM | POA: Diagnosis not present

## 2019-08-07 DIAGNOSIS — R1312 Dysphagia, oropharyngeal phase: Secondary | ICD-10-CM | POA: Diagnosis not present

## 2019-08-07 DIAGNOSIS — R1319 Other dysphagia: Secondary | ICD-10-CM | POA: Diagnosis not present

## 2019-08-07 DIAGNOSIS — Z7409 Other reduced mobility: Secondary | ICD-10-CM | POA: Diagnosis not present

## 2019-08-07 DIAGNOSIS — M2450 Contracture, unspecified joint: Secondary | ICD-10-CM | POA: Diagnosis not present

## 2019-08-07 DIAGNOSIS — M79661 Pain in right lower leg: Secondary | ICD-10-CM | POA: Diagnosis not present

## 2019-08-08 DIAGNOSIS — R293 Abnormal posture: Secondary | ICD-10-CM | POA: Diagnosis not present

## 2019-08-08 DIAGNOSIS — M24561 Contracture, right knee: Secondary | ICD-10-CM | POA: Diagnosis not present

## 2019-08-08 DIAGNOSIS — J168 Pneumonia due to other specified infectious organisms: Secondary | ICD-10-CM | POA: Diagnosis not present

## 2019-08-08 DIAGNOSIS — R2689 Other abnormalities of gait and mobility: Secondary | ICD-10-CM | POA: Diagnosis not present

## 2019-08-08 DIAGNOSIS — J9601 Acute respiratory failure with hypoxia: Secondary | ICD-10-CM | POA: Diagnosis not present

## 2019-08-08 DIAGNOSIS — G9341 Metabolic encephalopathy: Secondary | ICD-10-CM | POA: Diagnosis not present

## 2019-08-08 DIAGNOSIS — R1319 Other dysphagia: Secondary | ICD-10-CM | POA: Diagnosis not present

## 2019-08-08 DIAGNOSIS — Z9181 History of falling: Secondary | ICD-10-CM | POA: Diagnosis not present

## 2019-08-08 DIAGNOSIS — Z7409 Other reduced mobility: Secondary | ICD-10-CM | POA: Diagnosis not present

## 2019-08-08 DIAGNOSIS — J690 Pneumonitis due to inhalation of food and vomit: Secondary | ICD-10-CM | POA: Diagnosis not present

## 2019-08-08 DIAGNOSIS — M79661 Pain in right lower leg: Secondary | ICD-10-CM | POA: Diagnosis not present

## 2019-08-08 DIAGNOSIS — M79662 Pain in left lower leg: Secondary | ICD-10-CM | POA: Diagnosis not present

## 2019-08-08 DIAGNOSIS — M2450 Contracture, unspecified joint: Secondary | ICD-10-CM | POA: Diagnosis not present

## 2019-08-08 DIAGNOSIS — M24571 Contracture, right ankle: Secondary | ICD-10-CM | POA: Diagnosis not present

## 2019-08-08 DIAGNOSIS — R278 Other lack of coordination: Secondary | ICD-10-CM | POA: Diagnosis not present

## 2019-08-08 DIAGNOSIS — R2681 Unsteadiness on feet: Secondary | ICD-10-CM | POA: Diagnosis not present

## 2019-08-08 DIAGNOSIS — R1312 Dysphagia, oropharyngeal phase: Secondary | ICD-10-CM | POA: Diagnosis not present

## 2019-08-08 DIAGNOSIS — M6281 Muscle weakness (generalized): Secondary | ICD-10-CM | POA: Diagnosis not present

## 2019-08-09 DIAGNOSIS — R293 Abnormal posture: Secondary | ICD-10-CM | POA: Diagnosis not present

## 2019-08-09 DIAGNOSIS — R2681 Unsteadiness on feet: Secondary | ICD-10-CM | POA: Diagnosis not present

## 2019-08-09 DIAGNOSIS — J9601 Acute respiratory failure with hypoxia: Secondary | ICD-10-CM | POA: Diagnosis not present

## 2019-08-09 DIAGNOSIS — M24561 Contracture, right knee: Secondary | ICD-10-CM | POA: Diagnosis not present

## 2019-08-09 DIAGNOSIS — G9341 Metabolic encephalopathy: Secondary | ICD-10-CM | POA: Diagnosis not present

## 2019-08-09 DIAGNOSIS — Z9181 History of falling: Secondary | ICD-10-CM | POA: Diagnosis not present

## 2019-08-09 DIAGNOSIS — R1319 Other dysphagia: Secondary | ICD-10-CM | POA: Diagnosis not present

## 2019-08-09 DIAGNOSIS — M79662 Pain in left lower leg: Secondary | ICD-10-CM | POA: Diagnosis not present

## 2019-08-09 DIAGNOSIS — M2450 Contracture, unspecified joint: Secondary | ICD-10-CM | POA: Diagnosis not present

## 2019-08-09 DIAGNOSIS — R1312 Dysphagia, oropharyngeal phase: Secondary | ICD-10-CM | POA: Diagnosis not present

## 2019-08-09 DIAGNOSIS — R278 Other lack of coordination: Secondary | ICD-10-CM | POA: Diagnosis not present

## 2019-08-09 DIAGNOSIS — M6281 Muscle weakness (generalized): Secondary | ICD-10-CM | POA: Diagnosis not present

## 2019-08-09 DIAGNOSIS — M79661 Pain in right lower leg: Secondary | ICD-10-CM | POA: Diagnosis not present

## 2019-08-09 DIAGNOSIS — J690 Pneumonitis due to inhalation of food and vomit: Secondary | ICD-10-CM | POA: Diagnosis not present

## 2019-08-09 DIAGNOSIS — Z7409 Other reduced mobility: Secondary | ICD-10-CM | POA: Diagnosis not present

## 2019-08-09 DIAGNOSIS — R2689 Other abnormalities of gait and mobility: Secondary | ICD-10-CM | POA: Diagnosis not present

## 2019-08-09 DIAGNOSIS — M24571 Contracture, right ankle: Secondary | ICD-10-CM | POA: Diagnosis not present

## 2019-08-10 DIAGNOSIS — J690 Pneumonitis due to inhalation of food and vomit: Secondary | ICD-10-CM | POA: Diagnosis not present

## 2019-08-10 DIAGNOSIS — R1319 Other dysphagia: Secondary | ICD-10-CM | POA: Diagnosis not present

## 2019-08-10 DIAGNOSIS — R2689 Other abnormalities of gait and mobility: Secondary | ICD-10-CM | POA: Diagnosis not present

## 2019-08-10 DIAGNOSIS — L89610 Pressure ulcer of right heel, unstageable: Secondary | ICD-10-CM | POA: Diagnosis not present

## 2019-08-10 DIAGNOSIS — Z9181 History of falling: Secondary | ICD-10-CM | POA: Diagnosis not present

## 2019-08-10 DIAGNOSIS — M79661 Pain in right lower leg: Secondary | ICD-10-CM | POA: Diagnosis not present

## 2019-08-10 DIAGNOSIS — G9341 Metabolic encephalopathy: Secondary | ICD-10-CM | POA: Diagnosis not present

## 2019-08-10 DIAGNOSIS — R293 Abnormal posture: Secondary | ICD-10-CM | POA: Diagnosis not present

## 2019-08-10 DIAGNOSIS — R1312 Dysphagia, oropharyngeal phase: Secondary | ICD-10-CM | POA: Diagnosis not present

## 2019-08-10 DIAGNOSIS — M2450 Contracture, unspecified joint: Secondary | ICD-10-CM | POA: Diagnosis not present

## 2019-08-10 DIAGNOSIS — M24561 Contracture, right knee: Secondary | ICD-10-CM | POA: Diagnosis not present

## 2019-08-10 DIAGNOSIS — J9601 Acute respiratory failure with hypoxia: Secondary | ICD-10-CM | POA: Diagnosis not present

## 2019-08-10 DIAGNOSIS — M79662 Pain in left lower leg: Secondary | ICD-10-CM | POA: Diagnosis not present

## 2019-08-10 DIAGNOSIS — L89153 Pressure ulcer of sacral region, stage 3: Secondary | ICD-10-CM | POA: Diagnosis not present

## 2019-08-10 DIAGNOSIS — R278 Other lack of coordination: Secondary | ICD-10-CM | POA: Diagnosis not present

## 2019-08-10 DIAGNOSIS — M24571 Contracture, right ankle: Secondary | ICD-10-CM | POA: Diagnosis not present

## 2019-08-10 DIAGNOSIS — Z7409 Other reduced mobility: Secondary | ICD-10-CM | POA: Diagnosis not present

## 2019-08-10 DIAGNOSIS — R2681 Unsteadiness on feet: Secondary | ICD-10-CM | POA: Diagnosis not present

## 2019-08-10 DIAGNOSIS — M6281 Muscle weakness (generalized): Secondary | ICD-10-CM | POA: Diagnosis not present

## 2019-08-11 DIAGNOSIS — M79661 Pain in right lower leg: Secondary | ICD-10-CM | POA: Diagnosis not present

## 2019-08-11 DIAGNOSIS — D649 Anemia, unspecified: Secondary | ICD-10-CM | POA: Diagnosis not present

## 2019-08-11 DIAGNOSIS — R2681 Unsteadiness on feet: Secondary | ICD-10-CM | POA: Diagnosis not present

## 2019-08-11 DIAGNOSIS — M6281 Muscle weakness (generalized): Secondary | ICD-10-CM | POA: Diagnosis not present

## 2019-08-11 DIAGNOSIS — Z9181 History of falling: Secondary | ICD-10-CM | POA: Diagnosis not present

## 2019-08-11 DIAGNOSIS — I10 Essential (primary) hypertension: Secondary | ICD-10-CM | POA: Diagnosis not present

## 2019-08-11 DIAGNOSIS — M2450 Contracture, unspecified joint: Secondary | ICD-10-CM | POA: Diagnosis not present

## 2019-08-11 DIAGNOSIS — R1319 Other dysphagia: Secondary | ICD-10-CM | POA: Diagnosis not present

## 2019-08-11 DIAGNOSIS — Z7409 Other reduced mobility: Secondary | ICD-10-CM | POA: Diagnosis not present

## 2019-08-11 DIAGNOSIS — R2689 Other abnormalities of gait and mobility: Secondary | ICD-10-CM | POA: Diagnosis not present

## 2019-08-11 DIAGNOSIS — M24571 Contracture, right ankle: Secondary | ICD-10-CM | POA: Diagnosis not present

## 2019-08-11 DIAGNOSIS — J9601 Acute respiratory failure with hypoxia: Secondary | ICD-10-CM | POA: Diagnosis not present

## 2019-08-11 DIAGNOSIS — G9341 Metabolic encephalopathy: Secondary | ICD-10-CM | POA: Diagnosis not present

## 2019-08-11 DIAGNOSIS — M24561 Contracture, right knee: Secondary | ICD-10-CM | POA: Diagnosis not present

## 2019-08-11 DIAGNOSIS — R278 Other lack of coordination: Secondary | ICD-10-CM | POA: Diagnosis not present

## 2019-08-11 DIAGNOSIS — R569 Unspecified convulsions: Secondary | ICD-10-CM | POA: Diagnosis not present

## 2019-08-11 DIAGNOSIS — R293 Abnormal posture: Secondary | ICD-10-CM | POA: Diagnosis not present

## 2019-08-11 DIAGNOSIS — M79662 Pain in left lower leg: Secondary | ICD-10-CM | POA: Diagnosis not present

## 2019-08-11 DIAGNOSIS — J690 Pneumonitis due to inhalation of food and vomit: Secondary | ICD-10-CM | POA: Diagnosis not present

## 2019-08-11 DIAGNOSIS — R1312 Dysphagia, oropharyngeal phase: Secondary | ICD-10-CM | POA: Diagnosis not present

## 2019-08-12 DIAGNOSIS — M24561 Contracture, right knee: Secondary | ICD-10-CM | POA: Diagnosis not present

## 2019-08-12 DIAGNOSIS — R1312 Dysphagia, oropharyngeal phase: Secondary | ICD-10-CM | POA: Diagnosis not present

## 2019-08-12 DIAGNOSIS — J690 Pneumonitis due to inhalation of food and vomit: Secondary | ICD-10-CM | POA: Diagnosis not present

## 2019-08-12 DIAGNOSIS — R278 Other lack of coordination: Secondary | ICD-10-CM | POA: Diagnosis not present

## 2019-08-12 DIAGNOSIS — Z7409 Other reduced mobility: Secondary | ICD-10-CM | POA: Diagnosis not present

## 2019-08-12 DIAGNOSIS — Z9181 History of falling: Secondary | ICD-10-CM | POA: Diagnosis not present

## 2019-08-12 DIAGNOSIS — R2689 Other abnormalities of gait and mobility: Secondary | ICD-10-CM | POA: Diagnosis not present

## 2019-08-12 DIAGNOSIS — M79662 Pain in left lower leg: Secondary | ICD-10-CM | POA: Diagnosis not present

## 2019-08-12 DIAGNOSIS — R1319 Other dysphagia: Secondary | ICD-10-CM | POA: Diagnosis not present

## 2019-08-12 DIAGNOSIS — M24571 Contracture, right ankle: Secondary | ICD-10-CM | POA: Diagnosis not present

## 2019-08-12 DIAGNOSIS — G9341 Metabolic encephalopathy: Secondary | ICD-10-CM | POA: Diagnosis not present

## 2019-08-12 DIAGNOSIS — M2450 Contracture, unspecified joint: Secondary | ICD-10-CM | POA: Diagnosis not present

## 2019-08-12 DIAGNOSIS — R2681 Unsteadiness on feet: Secondary | ICD-10-CM | POA: Diagnosis not present

## 2019-08-12 DIAGNOSIS — M6281 Muscle weakness (generalized): Secondary | ICD-10-CM | POA: Diagnosis not present

## 2019-08-12 DIAGNOSIS — J9601 Acute respiratory failure with hypoxia: Secondary | ICD-10-CM | POA: Diagnosis not present

## 2019-08-12 DIAGNOSIS — M79661 Pain in right lower leg: Secondary | ICD-10-CM | POA: Diagnosis not present

## 2019-08-12 DIAGNOSIS — R293 Abnormal posture: Secondary | ICD-10-CM | POA: Diagnosis not present

## 2019-08-13 DIAGNOSIS — E870 Hyperosmolality and hypernatremia: Secondary | ICD-10-CM | POA: Diagnosis not present

## 2019-08-13 DIAGNOSIS — G9341 Metabolic encephalopathy: Secondary | ICD-10-CM | POA: Diagnosis not present

## 2019-08-13 DIAGNOSIS — L89613 Pressure ulcer of right heel, stage 3: Secondary | ICD-10-CM | POA: Diagnosis not present

## 2019-08-13 DIAGNOSIS — J690 Pneumonitis due to inhalation of food and vomit: Secondary | ICD-10-CM | POA: Diagnosis not present

## 2019-08-13 DIAGNOSIS — L89150 Pressure ulcer of sacral region, unstageable: Secondary | ICD-10-CM | POA: Diagnosis not present

## 2019-08-13 DIAGNOSIS — R2681 Unsteadiness on feet: Secondary | ICD-10-CM | POA: Diagnosis not present

## 2019-08-13 DIAGNOSIS — R293 Abnormal posture: Secondary | ICD-10-CM | POA: Diagnosis not present

## 2019-08-13 DIAGNOSIS — M79661 Pain in right lower leg: Secondary | ICD-10-CM | POA: Diagnosis not present

## 2019-08-13 DIAGNOSIS — M79662 Pain in left lower leg: Secondary | ICD-10-CM | POA: Diagnosis not present

## 2019-08-13 DIAGNOSIS — M2450 Contracture, unspecified joint: Secondary | ICD-10-CM | POA: Diagnosis not present

## 2019-08-13 DIAGNOSIS — M24561 Contracture, right knee: Secondary | ICD-10-CM | POA: Diagnosis not present

## 2019-08-13 DIAGNOSIS — R278 Other lack of coordination: Secondary | ICD-10-CM | POA: Diagnosis not present

## 2019-08-13 DIAGNOSIS — Z9181 History of falling: Secondary | ICD-10-CM | POA: Diagnosis not present

## 2019-08-13 DIAGNOSIS — R1319 Other dysphagia: Secondary | ICD-10-CM | POA: Diagnosis not present

## 2019-08-13 DIAGNOSIS — J9601 Acute respiratory failure with hypoxia: Secondary | ICD-10-CM | POA: Diagnosis not present

## 2019-08-13 DIAGNOSIS — R2689 Other abnormalities of gait and mobility: Secondary | ICD-10-CM | POA: Diagnosis not present

## 2019-08-13 DIAGNOSIS — M24571 Contracture, right ankle: Secondary | ICD-10-CM | POA: Diagnosis not present

## 2019-08-13 DIAGNOSIS — R1312 Dysphagia, oropharyngeal phase: Secondary | ICD-10-CM | POA: Diagnosis not present

## 2019-08-13 DIAGNOSIS — M6281 Muscle weakness (generalized): Secondary | ICD-10-CM | POA: Diagnosis not present

## 2019-08-13 DIAGNOSIS — Z7409 Other reduced mobility: Secondary | ICD-10-CM | POA: Diagnosis not present

## 2019-08-14 DIAGNOSIS — R2681 Unsteadiness on feet: Secondary | ICD-10-CM | POA: Diagnosis not present

## 2019-08-14 DIAGNOSIS — M24571 Contracture, right ankle: Secondary | ICD-10-CM | POA: Diagnosis not present

## 2019-08-14 DIAGNOSIS — Z7409 Other reduced mobility: Secondary | ICD-10-CM | POA: Diagnosis not present

## 2019-08-14 DIAGNOSIS — G9341 Metabolic encephalopathy: Secondary | ICD-10-CM | POA: Diagnosis not present

## 2019-08-14 DIAGNOSIS — M2450 Contracture, unspecified joint: Secondary | ICD-10-CM | POA: Diagnosis not present

## 2019-08-14 DIAGNOSIS — R1319 Other dysphagia: Secondary | ICD-10-CM | POA: Diagnosis not present

## 2019-08-14 DIAGNOSIS — R1312 Dysphagia, oropharyngeal phase: Secondary | ICD-10-CM | POA: Diagnosis not present

## 2019-08-14 DIAGNOSIS — R278 Other lack of coordination: Secondary | ICD-10-CM | POA: Diagnosis not present

## 2019-08-14 DIAGNOSIS — M79661 Pain in right lower leg: Secondary | ICD-10-CM | POA: Diagnosis not present

## 2019-08-14 DIAGNOSIS — M24561 Contracture, right knee: Secondary | ICD-10-CM | POA: Diagnosis not present

## 2019-08-14 DIAGNOSIS — R2689 Other abnormalities of gait and mobility: Secondary | ICD-10-CM | POA: Diagnosis not present

## 2019-08-14 DIAGNOSIS — M6281 Muscle weakness (generalized): Secondary | ICD-10-CM | POA: Diagnosis not present

## 2019-08-14 DIAGNOSIS — Z20822 Contact with and (suspected) exposure to covid-19: Secondary | ICD-10-CM | POA: Diagnosis not present

## 2019-08-14 DIAGNOSIS — J690 Pneumonitis due to inhalation of food and vomit: Secondary | ICD-10-CM | POA: Diagnosis not present

## 2019-08-14 DIAGNOSIS — J9601 Acute respiratory failure with hypoxia: Secondary | ICD-10-CM | POA: Diagnosis not present

## 2019-08-14 DIAGNOSIS — R293 Abnormal posture: Secondary | ICD-10-CM | POA: Diagnosis not present

## 2019-08-14 DIAGNOSIS — Z9181 History of falling: Secondary | ICD-10-CM | POA: Diagnosis not present

## 2019-08-14 DIAGNOSIS — M79662 Pain in left lower leg: Secondary | ICD-10-CM | POA: Diagnosis not present

## 2019-08-14 DIAGNOSIS — K5901 Slow transit constipation: Secondary | ICD-10-CM | POA: Diagnosis not present

## 2019-08-15 DIAGNOSIS — M79662 Pain in left lower leg: Secondary | ICD-10-CM | POA: Diagnosis not present

## 2019-08-15 DIAGNOSIS — R2681 Unsteadiness on feet: Secondary | ICD-10-CM | POA: Diagnosis not present

## 2019-08-15 DIAGNOSIS — J690 Pneumonitis due to inhalation of food and vomit: Secondary | ICD-10-CM | POA: Diagnosis not present

## 2019-08-15 DIAGNOSIS — M79661 Pain in right lower leg: Secondary | ICD-10-CM | POA: Diagnosis not present

## 2019-08-15 DIAGNOSIS — Z7409 Other reduced mobility: Secondary | ICD-10-CM | POA: Diagnosis not present

## 2019-08-15 DIAGNOSIS — M6281 Muscle weakness (generalized): Secondary | ICD-10-CM | POA: Diagnosis not present

## 2019-08-15 DIAGNOSIS — Z9181 History of falling: Secondary | ICD-10-CM | POA: Diagnosis not present

## 2019-08-15 DIAGNOSIS — R278 Other lack of coordination: Secondary | ICD-10-CM | POA: Diagnosis not present

## 2019-08-15 DIAGNOSIS — M24561 Contracture, right knee: Secondary | ICD-10-CM | POA: Diagnosis not present

## 2019-08-15 DIAGNOSIS — R1312 Dysphagia, oropharyngeal phase: Secondary | ICD-10-CM | POA: Diagnosis not present

## 2019-08-15 DIAGNOSIS — M24571 Contracture, right ankle: Secondary | ICD-10-CM | POA: Diagnosis not present

## 2019-08-15 DIAGNOSIS — R1319 Other dysphagia: Secondary | ICD-10-CM | POA: Diagnosis not present

## 2019-08-15 DIAGNOSIS — G9341 Metabolic encephalopathy: Secondary | ICD-10-CM | POA: Diagnosis not present

## 2019-08-15 DIAGNOSIS — J9601 Acute respiratory failure with hypoxia: Secondary | ICD-10-CM | POA: Diagnosis not present

## 2019-08-15 DIAGNOSIS — M2450 Contracture, unspecified joint: Secondary | ICD-10-CM | POA: Diagnosis not present

## 2019-08-15 DIAGNOSIS — R293 Abnormal posture: Secondary | ICD-10-CM | POA: Diagnosis not present

## 2019-08-15 DIAGNOSIS — R2689 Other abnormalities of gait and mobility: Secondary | ICD-10-CM | POA: Diagnosis not present

## 2019-08-16 DIAGNOSIS — M24571 Contracture, right ankle: Secondary | ICD-10-CM | POA: Diagnosis not present

## 2019-08-16 DIAGNOSIS — J690 Pneumonitis due to inhalation of food and vomit: Secondary | ICD-10-CM | POA: Diagnosis not present

## 2019-08-16 DIAGNOSIS — Z9181 History of falling: Secondary | ICD-10-CM | POA: Diagnosis not present

## 2019-08-16 DIAGNOSIS — M79662 Pain in left lower leg: Secondary | ICD-10-CM | POA: Diagnosis not present

## 2019-08-16 DIAGNOSIS — R278 Other lack of coordination: Secondary | ICD-10-CM | POA: Diagnosis not present

## 2019-08-16 DIAGNOSIS — G9341 Metabolic encephalopathy: Secondary | ICD-10-CM | POA: Diagnosis not present

## 2019-08-16 DIAGNOSIS — M6281 Muscle weakness (generalized): Secondary | ICD-10-CM | POA: Diagnosis not present

## 2019-08-16 DIAGNOSIS — R1319 Other dysphagia: Secondary | ICD-10-CM | POA: Diagnosis not present

## 2019-08-16 DIAGNOSIS — M79661 Pain in right lower leg: Secondary | ICD-10-CM | POA: Diagnosis not present

## 2019-08-16 DIAGNOSIS — M24561 Contracture, right knee: Secondary | ICD-10-CM | POA: Diagnosis not present

## 2019-08-16 DIAGNOSIS — M2450 Contracture, unspecified joint: Secondary | ICD-10-CM | POA: Diagnosis not present

## 2019-08-16 DIAGNOSIS — R2689 Other abnormalities of gait and mobility: Secondary | ICD-10-CM | POA: Diagnosis not present

## 2019-08-16 DIAGNOSIS — R1312 Dysphagia, oropharyngeal phase: Secondary | ICD-10-CM | POA: Diagnosis not present

## 2019-08-16 DIAGNOSIS — J9601 Acute respiratory failure with hypoxia: Secondary | ICD-10-CM | POA: Diagnosis not present

## 2019-08-16 DIAGNOSIS — Z7409 Other reduced mobility: Secondary | ICD-10-CM | POA: Diagnosis not present

## 2019-08-16 DIAGNOSIS — R293 Abnormal posture: Secondary | ICD-10-CM | POA: Diagnosis not present

## 2019-08-16 DIAGNOSIS — R2681 Unsteadiness on feet: Secondary | ICD-10-CM | POA: Diagnosis not present

## 2019-08-17 DIAGNOSIS — J690 Pneumonitis due to inhalation of food and vomit: Secondary | ICD-10-CM | POA: Diagnosis not present

## 2019-08-17 DIAGNOSIS — R2681 Unsteadiness on feet: Secondary | ICD-10-CM | POA: Diagnosis not present

## 2019-08-17 DIAGNOSIS — M24561 Contracture, right knee: Secondary | ICD-10-CM | POA: Diagnosis not present

## 2019-08-17 DIAGNOSIS — M79661 Pain in right lower leg: Secondary | ICD-10-CM | POA: Diagnosis not present

## 2019-08-17 DIAGNOSIS — R278 Other lack of coordination: Secondary | ICD-10-CM | POA: Diagnosis not present

## 2019-08-17 DIAGNOSIS — Z9181 History of falling: Secondary | ICD-10-CM | POA: Diagnosis not present

## 2019-08-17 DIAGNOSIS — L89520 Pressure ulcer of left ankle, unstageable: Secondary | ICD-10-CM | POA: Diagnosis not present

## 2019-08-17 DIAGNOSIS — M6281 Muscle weakness (generalized): Secondary | ICD-10-CM | POA: Diagnosis not present

## 2019-08-17 DIAGNOSIS — G9341 Metabolic encephalopathy: Secondary | ICD-10-CM | POA: Diagnosis not present

## 2019-08-17 DIAGNOSIS — R293 Abnormal posture: Secondary | ICD-10-CM | POA: Diagnosis not present

## 2019-08-17 DIAGNOSIS — L89153 Pressure ulcer of sacral region, stage 3: Secondary | ICD-10-CM | POA: Diagnosis not present

## 2019-08-17 DIAGNOSIS — M24571 Contracture, right ankle: Secondary | ICD-10-CM | POA: Diagnosis not present

## 2019-08-17 DIAGNOSIS — L89610 Pressure ulcer of right heel, unstageable: Secondary | ICD-10-CM | POA: Diagnosis not present

## 2019-08-17 DIAGNOSIS — M79662 Pain in left lower leg: Secondary | ICD-10-CM | POA: Diagnosis not present

## 2019-08-17 DIAGNOSIS — R2689 Other abnormalities of gait and mobility: Secondary | ICD-10-CM | POA: Diagnosis not present

## 2019-08-17 DIAGNOSIS — M2450 Contracture, unspecified joint: Secondary | ICD-10-CM | POA: Diagnosis not present

## 2019-08-17 DIAGNOSIS — J9601 Acute respiratory failure with hypoxia: Secondary | ICD-10-CM | POA: Diagnosis not present

## 2019-08-17 DIAGNOSIS — R1319 Other dysphagia: Secondary | ICD-10-CM | POA: Diagnosis not present

## 2019-08-17 DIAGNOSIS — R1312 Dysphagia, oropharyngeal phase: Secondary | ICD-10-CM | POA: Diagnosis not present

## 2019-08-17 DIAGNOSIS — Z7409 Other reduced mobility: Secondary | ICD-10-CM | POA: Diagnosis not present

## 2019-08-18 DIAGNOSIS — R293 Abnormal posture: Secondary | ICD-10-CM | POA: Diagnosis not present

## 2019-08-18 DIAGNOSIS — R2681 Unsteadiness on feet: Secondary | ICD-10-CM | POA: Diagnosis not present

## 2019-08-18 DIAGNOSIS — R1319 Other dysphagia: Secondary | ICD-10-CM | POA: Diagnosis not present

## 2019-08-18 DIAGNOSIS — J188 Other pneumonia, unspecified organism: Secondary | ICD-10-CM | POA: Diagnosis not present

## 2019-08-18 DIAGNOSIS — I10 Essential (primary) hypertension: Secondary | ICD-10-CM | POA: Diagnosis not present

## 2019-08-18 DIAGNOSIS — R2689 Other abnormalities of gait and mobility: Secondary | ICD-10-CM | POA: Diagnosis not present

## 2019-08-18 DIAGNOSIS — M24571 Contracture, right ankle: Secondary | ICD-10-CM | POA: Diagnosis not present

## 2019-08-18 DIAGNOSIS — G9341 Metabolic encephalopathy: Secondary | ICD-10-CM | POA: Diagnosis not present

## 2019-08-18 DIAGNOSIS — M2450 Contracture, unspecified joint: Secondary | ICD-10-CM | POA: Diagnosis not present

## 2019-08-18 DIAGNOSIS — J690 Pneumonitis due to inhalation of food and vomit: Secondary | ICD-10-CM | POA: Diagnosis not present

## 2019-08-18 DIAGNOSIS — R278 Other lack of coordination: Secondary | ICD-10-CM | POA: Diagnosis not present

## 2019-08-18 DIAGNOSIS — Z9181 History of falling: Secondary | ICD-10-CM | POA: Diagnosis not present

## 2019-08-18 DIAGNOSIS — Z7409 Other reduced mobility: Secondary | ICD-10-CM | POA: Diagnosis not present

## 2019-08-18 DIAGNOSIS — M24561 Contracture, right knee: Secondary | ICD-10-CM | POA: Diagnosis not present

## 2019-08-18 DIAGNOSIS — R627 Adult failure to thrive: Secondary | ICD-10-CM | POA: Diagnosis not present

## 2019-08-18 DIAGNOSIS — M79661 Pain in right lower leg: Secondary | ICD-10-CM | POA: Diagnosis not present

## 2019-08-18 DIAGNOSIS — J9601 Acute respiratory failure with hypoxia: Secondary | ICD-10-CM | POA: Diagnosis not present

## 2019-08-18 DIAGNOSIS — R1312 Dysphagia, oropharyngeal phase: Secondary | ICD-10-CM | POA: Diagnosis not present

## 2019-08-18 DIAGNOSIS — M6281 Muscle weakness (generalized): Secondary | ICD-10-CM | POA: Diagnosis not present

## 2019-08-18 DIAGNOSIS — M79662 Pain in left lower leg: Secondary | ICD-10-CM | POA: Diagnosis not present

## 2019-08-19 DIAGNOSIS — R1319 Other dysphagia: Secondary | ICD-10-CM | POA: Diagnosis not present

## 2019-08-19 DIAGNOSIS — M79671 Pain in right foot: Secondary | ICD-10-CM | POA: Diagnosis not present

## 2019-08-19 DIAGNOSIS — R278 Other lack of coordination: Secondary | ICD-10-CM | POA: Diagnosis not present

## 2019-08-19 DIAGNOSIS — M79662 Pain in left lower leg: Secondary | ICD-10-CM | POA: Diagnosis not present

## 2019-08-19 DIAGNOSIS — R2689 Other abnormalities of gait and mobility: Secondary | ICD-10-CM | POA: Diagnosis not present

## 2019-08-19 DIAGNOSIS — J690 Pneumonitis due to inhalation of food and vomit: Secondary | ICD-10-CM | POA: Diagnosis not present

## 2019-08-19 DIAGNOSIS — M6281 Muscle weakness (generalized): Secondary | ICD-10-CM | POA: Diagnosis not present

## 2019-08-19 DIAGNOSIS — Z9181 History of falling: Secondary | ICD-10-CM | POA: Diagnosis not present

## 2019-08-19 DIAGNOSIS — R293 Abnormal posture: Secondary | ICD-10-CM | POA: Diagnosis not present

## 2019-08-19 DIAGNOSIS — J9601 Acute respiratory failure with hypoxia: Secondary | ICD-10-CM | POA: Diagnosis not present

## 2019-08-19 DIAGNOSIS — M24571 Contracture, right ankle: Secondary | ICD-10-CM | POA: Diagnosis not present

## 2019-08-19 DIAGNOSIS — G9341 Metabolic encephalopathy: Secondary | ICD-10-CM | POA: Diagnosis not present

## 2019-08-19 DIAGNOSIS — M2450 Contracture, unspecified joint: Secondary | ICD-10-CM | POA: Diagnosis not present

## 2019-08-19 DIAGNOSIS — M533 Sacrococcygeal disorders, not elsewhere classified: Secondary | ICD-10-CM | POA: Diagnosis not present

## 2019-08-19 DIAGNOSIS — R1312 Dysphagia, oropharyngeal phase: Secondary | ICD-10-CM | POA: Diagnosis not present

## 2019-08-19 DIAGNOSIS — R2681 Unsteadiness on feet: Secondary | ICD-10-CM | POA: Diagnosis not present

## 2019-08-19 DIAGNOSIS — Z20822 Contact with and (suspected) exposure to covid-19: Secondary | ICD-10-CM | POA: Diagnosis not present

## 2019-08-19 DIAGNOSIS — M24561 Contracture, right knee: Secondary | ICD-10-CM | POA: Diagnosis not present

## 2019-08-19 DIAGNOSIS — M79661 Pain in right lower leg: Secondary | ICD-10-CM | POA: Diagnosis not present

## 2019-08-19 DIAGNOSIS — Z7409 Other reduced mobility: Secondary | ICD-10-CM | POA: Diagnosis not present

## 2019-08-20 DIAGNOSIS — R278 Other lack of coordination: Secondary | ICD-10-CM | POA: Diagnosis not present

## 2019-08-20 DIAGNOSIS — M24571 Contracture, right ankle: Secondary | ICD-10-CM | POA: Diagnosis not present

## 2019-08-20 DIAGNOSIS — G9341 Metabolic encephalopathy: Secondary | ICD-10-CM | POA: Diagnosis not present

## 2019-08-20 DIAGNOSIS — R293 Abnormal posture: Secondary | ICD-10-CM | POA: Diagnosis not present

## 2019-08-20 DIAGNOSIS — J690 Pneumonitis due to inhalation of food and vomit: Secondary | ICD-10-CM | POA: Diagnosis not present

## 2019-08-20 DIAGNOSIS — Z7409 Other reduced mobility: Secondary | ICD-10-CM | POA: Diagnosis not present

## 2019-08-20 DIAGNOSIS — M6281 Muscle weakness (generalized): Secondary | ICD-10-CM | POA: Diagnosis not present

## 2019-08-20 DIAGNOSIS — R1312 Dysphagia, oropharyngeal phase: Secondary | ICD-10-CM | POA: Diagnosis not present

## 2019-08-20 DIAGNOSIS — M79662 Pain in left lower leg: Secondary | ICD-10-CM | POA: Diagnosis not present

## 2019-08-20 DIAGNOSIS — J9601 Acute respiratory failure with hypoxia: Secondary | ICD-10-CM | POA: Diagnosis not present

## 2019-08-20 DIAGNOSIS — M79661 Pain in right lower leg: Secondary | ICD-10-CM | POA: Diagnosis not present

## 2019-08-20 DIAGNOSIS — R2681 Unsteadiness on feet: Secondary | ICD-10-CM | POA: Diagnosis not present

## 2019-08-20 DIAGNOSIS — Z9181 History of falling: Secondary | ICD-10-CM | POA: Diagnosis not present

## 2019-08-20 DIAGNOSIS — R2689 Other abnormalities of gait and mobility: Secondary | ICD-10-CM | POA: Diagnosis not present

## 2019-08-20 DIAGNOSIS — R1319 Other dysphagia: Secondary | ICD-10-CM | POA: Diagnosis not present

## 2019-08-20 DIAGNOSIS — M24561 Contracture, right knee: Secondary | ICD-10-CM | POA: Diagnosis not present

## 2019-08-20 DIAGNOSIS — M2450 Contracture, unspecified joint: Secondary | ICD-10-CM | POA: Diagnosis not present

## 2019-08-21 DIAGNOSIS — Z7409 Other reduced mobility: Secondary | ICD-10-CM | POA: Diagnosis not present

## 2019-08-21 DIAGNOSIS — R2681 Unsteadiness on feet: Secondary | ICD-10-CM | POA: Diagnosis not present

## 2019-08-21 DIAGNOSIS — R293 Abnormal posture: Secondary | ICD-10-CM | POA: Diagnosis not present

## 2019-08-21 DIAGNOSIS — M79661 Pain in right lower leg: Secondary | ICD-10-CM | POA: Diagnosis not present

## 2019-08-21 DIAGNOSIS — M6281 Muscle weakness (generalized): Secondary | ICD-10-CM | POA: Diagnosis not present

## 2019-08-21 DIAGNOSIS — Z9181 History of falling: Secondary | ICD-10-CM | POA: Diagnosis not present

## 2019-08-21 DIAGNOSIS — R278 Other lack of coordination: Secondary | ICD-10-CM | POA: Diagnosis not present

## 2019-08-21 DIAGNOSIS — G9341 Metabolic encephalopathy: Secondary | ICD-10-CM | POA: Diagnosis not present

## 2019-08-21 DIAGNOSIS — M2450 Contracture, unspecified joint: Secondary | ICD-10-CM | POA: Diagnosis not present

## 2019-08-21 DIAGNOSIS — J690 Pneumonitis due to inhalation of food and vomit: Secondary | ICD-10-CM | POA: Diagnosis not present

## 2019-08-21 DIAGNOSIS — M24571 Contracture, right ankle: Secondary | ICD-10-CM | POA: Diagnosis not present

## 2019-08-21 DIAGNOSIS — M24561 Contracture, right knee: Secondary | ICD-10-CM | POA: Diagnosis not present

## 2019-08-21 DIAGNOSIS — J9601 Acute respiratory failure with hypoxia: Secondary | ICD-10-CM | POA: Diagnosis not present

## 2019-08-21 DIAGNOSIS — M79662 Pain in left lower leg: Secondary | ICD-10-CM | POA: Diagnosis not present

## 2019-08-21 DIAGNOSIS — R1312 Dysphagia, oropharyngeal phase: Secondary | ICD-10-CM | POA: Diagnosis not present

## 2019-08-21 DIAGNOSIS — R1319 Other dysphagia: Secondary | ICD-10-CM | POA: Diagnosis not present

## 2019-08-21 DIAGNOSIS — R2689 Other abnormalities of gait and mobility: Secondary | ICD-10-CM | POA: Diagnosis not present

## 2019-08-23 DIAGNOSIS — Z9181 History of falling: Secondary | ICD-10-CM | POA: Diagnosis not present

## 2019-08-23 DIAGNOSIS — G9341 Metabolic encephalopathy: Secondary | ICD-10-CM | POA: Diagnosis not present

## 2019-08-23 DIAGNOSIS — R2681 Unsteadiness on feet: Secondary | ICD-10-CM | POA: Diagnosis not present

## 2019-08-23 DIAGNOSIS — R1312 Dysphagia, oropharyngeal phase: Secondary | ICD-10-CM | POA: Diagnosis not present

## 2019-08-23 DIAGNOSIS — M6281 Muscle weakness (generalized): Secondary | ICD-10-CM | POA: Diagnosis not present

## 2019-08-23 DIAGNOSIS — M79662 Pain in left lower leg: Secondary | ICD-10-CM | POA: Diagnosis not present

## 2019-08-23 DIAGNOSIS — M2450 Contracture, unspecified joint: Secondary | ICD-10-CM | POA: Diagnosis not present

## 2019-08-23 DIAGNOSIS — J9601 Acute respiratory failure with hypoxia: Secondary | ICD-10-CM | POA: Diagnosis not present

## 2019-08-23 DIAGNOSIS — M79661 Pain in right lower leg: Secondary | ICD-10-CM | POA: Diagnosis not present

## 2019-08-23 DIAGNOSIS — Z7409 Other reduced mobility: Secondary | ICD-10-CM | POA: Diagnosis not present

## 2019-08-23 DIAGNOSIS — R278 Other lack of coordination: Secondary | ICD-10-CM | POA: Diagnosis not present

## 2019-08-23 DIAGNOSIS — J690 Pneumonitis due to inhalation of food and vomit: Secondary | ICD-10-CM | POA: Diagnosis not present

## 2019-08-23 DIAGNOSIS — R2689 Other abnormalities of gait and mobility: Secondary | ICD-10-CM | POA: Diagnosis not present

## 2019-08-23 DIAGNOSIS — M24561 Contracture, right knee: Secondary | ICD-10-CM | POA: Diagnosis not present

## 2019-08-23 DIAGNOSIS — R1319 Other dysphagia: Secondary | ICD-10-CM | POA: Diagnosis not present

## 2019-08-23 DIAGNOSIS — R293 Abnormal posture: Secondary | ICD-10-CM | POA: Diagnosis not present

## 2019-08-23 DIAGNOSIS — M24571 Contracture, right ankle: Secondary | ICD-10-CM | POA: Diagnosis not present

## 2019-08-24 DIAGNOSIS — L89520 Pressure ulcer of left ankle, unstageable: Secondary | ICD-10-CM | POA: Diagnosis not present

## 2019-08-24 DIAGNOSIS — J690 Pneumonitis due to inhalation of food and vomit: Secondary | ICD-10-CM | POA: Diagnosis not present

## 2019-08-24 DIAGNOSIS — R1312 Dysphagia, oropharyngeal phase: Secondary | ICD-10-CM | POA: Diagnosis not present

## 2019-08-24 DIAGNOSIS — R2689 Other abnormalities of gait and mobility: Secondary | ICD-10-CM | POA: Diagnosis not present

## 2019-08-24 DIAGNOSIS — R278 Other lack of coordination: Secondary | ICD-10-CM | POA: Diagnosis not present

## 2019-08-24 DIAGNOSIS — J9601 Acute respiratory failure with hypoxia: Secondary | ICD-10-CM | POA: Diagnosis not present

## 2019-08-24 DIAGNOSIS — R2681 Unsteadiness on feet: Secondary | ICD-10-CM | POA: Diagnosis not present

## 2019-08-24 DIAGNOSIS — M2450 Contracture, unspecified joint: Secondary | ICD-10-CM | POA: Diagnosis not present

## 2019-08-24 DIAGNOSIS — R293 Abnormal posture: Secondary | ICD-10-CM | POA: Diagnosis not present

## 2019-08-24 DIAGNOSIS — Z9181 History of falling: Secondary | ICD-10-CM | POA: Diagnosis not present

## 2019-08-24 DIAGNOSIS — M79662 Pain in left lower leg: Secondary | ICD-10-CM | POA: Diagnosis not present

## 2019-08-24 DIAGNOSIS — M24561 Contracture, right knee: Secondary | ICD-10-CM | POA: Diagnosis not present

## 2019-08-24 DIAGNOSIS — L89610 Pressure ulcer of right heel, unstageable: Secondary | ICD-10-CM | POA: Diagnosis not present

## 2019-08-24 DIAGNOSIS — M24571 Contracture, right ankle: Secondary | ICD-10-CM | POA: Diagnosis not present

## 2019-08-24 DIAGNOSIS — L89153 Pressure ulcer of sacral region, stage 3: Secondary | ICD-10-CM | POA: Diagnosis not present

## 2019-08-24 DIAGNOSIS — R1319 Other dysphagia: Secondary | ICD-10-CM | POA: Diagnosis not present

## 2019-08-24 DIAGNOSIS — M6281 Muscle weakness (generalized): Secondary | ICD-10-CM | POA: Diagnosis not present

## 2019-08-24 DIAGNOSIS — G9341 Metabolic encephalopathy: Secondary | ICD-10-CM | POA: Diagnosis not present

## 2019-08-24 DIAGNOSIS — Z7409 Other reduced mobility: Secondary | ICD-10-CM | POA: Diagnosis not present

## 2019-08-24 DIAGNOSIS — M79661 Pain in right lower leg: Secondary | ICD-10-CM | POA: Diagnosis not present

## 2019-08-26 DIAGNOSIS — R2689 Other abnormalities of gait and mobility: Secondary | ICD-10-CM | POA: Diagnosis not present

## 2019-08-26 DIAGNOSIS — R1312 Dysphagia, oropharyngeal phase: Secondary | ICD-10-CM | POA: Diagnosis not present

## 2019-08-26 DIAGNOSIS — M24571 Contracture, right ankle: Secondary | ICD-10-CM | POA: Diagnosis not present

## 2019-08-26 DIAGNOSIS — M79662 Pain in left lower leg: Secondary | ICD-10-CM | POA: Diagnosis not present

## 2019-08-26 DIAGNOSIS — L89153 Pressure ulcer of sacral region, stage 3: Secondary | ICD-10-CM | POA: Diagnosis not present

## 2019-08-26 DIAGNOSIS — Z9181 History of falling: Secondary | ICD-10-CM | POA: Diagnosis not present

## 2019-08-26 DIAGNOSIS — M2450 Contracture, unspecified joint: Secondary | ICD-10-CM | POA: Diagnosis not present

## 2019-08-26 DIAGNOSIS — G9341 Metabolic encephalopathy: Secondary | ICD-10-CM | POA: Diagnosis not present

## 2019-08-26 DIAGNOSIS — L89610 Pressure ulcer of right heel, unstageable: Secondary | ICD-10-CM | POA: Diagnosis not present

## 2019-08-26 DIAGNOSIS — M79661 Pain in right lower leg: Secondary | ICD-10-CM | POA: Diagnosis not present

## 2019-08-26 DIAGNOSIS — R2681 Unsteadiness on feet: Secondary | ICD-10-CM | POA: Diagnosis not present

## 2019-08-26 DIAGNOSIS — R293 Abnormal posture: Secondary | ICD-10-CM | POA: Diagnosis not present

## 2019-08-26 DIAGNOSIS — M6281 Muscle weakness (generalized): Secondary | ICD-10-CM | POA: Diagnosis not present

## 2019-08-26 DIAGNOSIS — Z7409 Other reduced mobility: Secondary | ICD-10-CM | POA: Diagnosis not present

## 2019-08-26 DIAGNOSIS — Z20822 Contact with and (suspected) exposure to covid-19: Secondary | ICD-10-CM | POA: Diagnosis not present

## 2019-08-26 DIAGNOSIS — L89520 Pressure ulcer of left ankle, unstageable: Secondary | ICD-10-CM | POA: Diagnosis not present

## 2019-08-26 DIAGNOSIS — R278 Other lack of coordination: Secondary | ICD-10-CM | POA: Diagnosis not present

## 2019-08-26 DIAGNOSIS — M24561 Contracture, right knee: Secondary | ICD-10-CM | POA: Diagnosis not present

## 2019-08-26 DIAGNOSIS — J9601 Acute respiratory failure with hypoxia: Secondary | ICD-10-CM | POA: Diagnosis not present

## 2019-08-26 DIAGNOSIS — R1319 Other dysphagia: Secondary | ICD-10-CM | POA: Diagnosis not present

## 2019-08-26 DIAGNOSIS — J690 Pneumonitis due to inhalation of food and vomit: Secondary | ICD-10-CM | POA: Diagnosis not present

## 2019-08-27 DIAGNOSIS — R2681 Unsteadiness on feet: Secondary | ICD-10-CM | POA: Diagnosis not present

## 2019-08-27 DIAGNOSIS — J9601 Acute respiratory failure with hypoxia: Secondary | ICD-10-CM | POA: Diagnosis not present

## 2019-08-27 DIAGNOSIS — M6281 Muscle weakness (generalized): Secondary | ICD-10-CM | POA: Diagnosis not present

## 2019-08-27 DIAGNOSIS — L89520 Pressure ulcer of left ankle, unstageable: Secondary | ICD-10-CM | POA: Diagnosis not present

## 2019-08-27 DIAGNOSIS — J690 Pneumonitis due to inhalation of food and vomit: Secondary | ICD-10-CM | POA: Diagnosis not present

## 2019-08-27 DIAGNOSIS — R2689 Other abnormalities of gait and mobility: Secondary | ICD-10-CM | POA: Diagnosis not present

## 2019-08-27 DIAGNOSIS — R52 Pain, unspecified: Secondary | ICD-10-CM | POA: Diagnosis not present

## 2019-08-27 DIAGNOSIS — M24561 Contracture, right knee: Secondary | ICD-10-CM | POA: Diagnosis not present

## 2019-08-27 DIAGNOSIS — R278 Other lack of coordination: Secondary | ICD-10-CM | POA: Diagnosis not present

## 2019-08-27 DIAGNOSIS — Z7409 Other reduced mobility: Secondary | ICD-10-CM | POA: Diagnosis not present

## 2019-08-27 DIAGNOSIS — R1312 Dysphagia, oropharyngeal phase: Secondary | ICD-10-CM | POA: Diagnosis not present

## 2019-08-27 DIAGNOSIS — M79661 Pain in right lower leg: Secondary | ICD-10-CM | POA: Diagnosis not present

## 2019-08-27 DIAGNOSIS — R1319 Other dysphagia: Secondary | ICD-10-CM | POA: Diagnosis not present

## 2019-08-27 DIAGNOSIS — R293 Abnormal posture: Secondary | ICD-10-CM | POA: Diagnosis not present

## 2019-08-27 DIAGNOSIS — Z9181 History of falling: Secondary | ICD-10-CM | POA: Diagnosis not present

## 2019-08-27 DIAGNOSIS — G9341 Metabolic encephalopathy: Secondary | ICD-10-CM | POA: Diagnosis not present

## 2019-08-27 DIAGNOSIS — M24571 Contracture, right ankle: Secondary | ICD-10-CM | POA: Diagnosis not present

## 2019-08-27 DIAGNOSIS — M2450 Contracture, unspecified joint: Secondary | ICD-10-CM | POA: Diagnosis not present

## 2019-08-27 DIAGNOSIS — M79662 Pain in left lower leg: Secondary | ICD-10-CM | POA: Diagnosis not present

## 2019-08-28 DIAGNOSIS — R1319 Other dysphagia: Secondary | ICD-10-CM | POA: Diagnosis not present

## 2019-08-28 DIAGNOSIS — R293 Abnormal posture: Secondary | ICD-10-CM | POA: Diagnosis not present

## 2019-08-28 DIAGNOSIS — J9601 Acute respiratory failure with hypoxia: Secondary | ICD-10-CM | POA: Diagnosis not present

## 2019-08-28 DIAGNOSIS — G9341 Metabolic encephalopathy: Secondary | ICD-10-CM | POA: Diagnosis not present

## 2019-08-28 DIAGNOSIS — R2681 Unsteadiness on feet: Secondary | ICD-10-CM | POA: Diagnosis not present

## 2019-08-28 DIAGNOSIS — M24571 Contracture, right ankle: Secondary | ICD-10-CM | POA: Diagnosis not present

## 2019-08-28 DIAGNOSIS — M6281 Muscle weakness (generalized): Secondary | ICD-10-CM | POA: Diagnosis not present

## 2019-08-28 DIAGNOSIS — Z9181 History of falling: Secondary | ICD-10-CM | POA: Diagnosis not present

## 2019-08-28 DIAGNOSIS — M79661 Pain in right lower leg: Secondary | ICD-10-CM | POA: Diagnosis not present

## 2019-08-28 DIAGNOSIS — R2689 Other abnormalities of gait and mobility: Secondary | ICD-10-CM | POA: Diagnosis not present

## 2019-08-28 DIAGNOSIS — J690 Pneumonitis due to inhalation of food and vomit: Secondary | ICD-10-CM | POA: Diagnosis not present

## 2019-08-28 DIAGNOSIS — R278 Other lack of coordination: Secondary | ICD-10-CM | POA: Diagnosis not present

## 2019-08-28 DIAGNOSIS — Z7409 Other reduced mobility: Secondary | ICD-10-CM | POA: Diagnosis not present

## 2019-08-28 DIAGNOSIS — M79662 Pain in left lower leg: Secondary | ICD-10-CM | POA: Diagnosis not present

## 2019-08-28 DIAGNOSIS — M2450 Contracture, unspecified joint: Secondary | ICD-10-CM | POA: Diagnosis not present

## 2019-08-28 DIAGNOSIS — R1312 Dysphagia, oropharyngeal phase: Secondary | ICD-10-CM | POA: Diagnosis not present

## 2019-08-28 DIAGNOSIS — M24561 Contracture, right knee: Secondary | ICD-10-CM | POA: Diagnosis not present

## 2019-08-31 DIAGNOSIS — R2689 Other abnormalities of gait and mobility: Secondary | ICD-10-CM | POA: Diagnosis not present

## 2019-08-31 DIAGNOSIS — Z9181 History of falling: Secondary | ICD-10-CM | POA: Diagnosis not present

## 2019-08-31 DIAGNOSIS — L89520 Pressure ulcer of left ankle, unstageable: Secondary | ICD-10-CM | POA: Diagnosis not present

## 2019-08-31 DIAGNOSIS — M2450 Contracture, unspecified joint: Secondary | ICD-10-CM | POA: Diagnosis not present

## 2019-08-31 DIAGNOSIS — L89610 Pressure ulcer of right heel, unstageable: Secondary | ICD-10-CM | POA: Diagnosis not present

## 2019-08-31 DIAGNOSIS — J9601 Acute respiratory failure with hypoxia: Secondary | ICD-10-CM | POA: Diagnosis not present

## 2019-08-31 DIAGNOSIS — R1319 Other dysphagia: Secondary | ICD-10-CM | POA: Diagnosis not present

## 2019-08-31 DIAGNOSIS — M24571 Contracture, right ankle: Secondary | ICD-10-CM | POA: Diagnosis not present

## 2019-08-31 DIAGNOSIS — M79661 Pain in right lower leg: Secondary | ICD-10-CM | POA: Diagnosis not present

## 2019-08-31 DIAGNOSIS — Z7409 Other reduced mobility: Secondary | ICD-10-CM | POA: Diagnosis not present

## 2019-08-31 DIAGNOSIS — M79662 Pain in left lower leg: Secondary | ICD-10-CM | POA: Diagnosis not present

## 2019-08-31 DIAGNOSIS — R293 Abnormal posture: Secondary | ICD-10-CM | POA: Diagnosis not present

## 2019-08-31 DIAGNOSIS — R278 Other lack of coordination: Secondary | ICD-10-CM | POA: Diagnosis not present

## 2019-08-31 DIAGNOSIS — R2681 Unsteadiness on feet: Secondary | ICD-10-CM | POA: Diagnosis not present

## 2019-08-31 DIAGNOSIS — R1312 Dysphagia, oropharyngeal phase: Secondary | ICD-10-CM | POA: Diagnosis not present

## 2019-08-31 DIAGNOSIS — M24561 Contracture, right knee: Secondary | ICD-10-CM | POA: Diagnosis not present

## 2019-08-31 DIAGNOSIS — M6281 Muscle weakness (generalized): Secondary | ICD-10-CM | POA: Diagnosis not present

## 2019-08-31 DIAGNOSIS — J690 Pneumonitis due to inhalation of food and vomit: Secondary | ICD-10-CM | POA: Diagnosis not present

## 2019-08-31 DIAGNOSIS — G9341 Metabolic encephalopathy: Secondary | ICD-10-CM | POA: Diagnosis not present

## 2019-08-31 DIAGNOSIS — L89153 Pressure ulcer of sacral region, stage 3: Secondary | ICD-10-CM | POA: Diagnosis not present

## 2019-09-01 DIAGNOSIS — M79662 Pain in left lower leg: Secondary | ICD-10-CM | POA: Diagnosis not present

## 2019-09-01 DIAGNOSIS — M24561 Contracture, right knee: Secondary | ICD-10-CM | POA: Diagnosis not present

## 2019-09-01 DIAGNOSIS — J690 Pneumonitis due to inhalation of food and vomit: Secondary | ICD-10-CM | POA: Diagnosis not present

## 2019-09-01 DIAGNOSIS — R278 Other lack of coordination: Secondary | ICD-10-CM | POA: Diagnosis not present

## 2019-09-01 DIAGNOSIS — Z7409 Other reduced mobility: Secondary | ICD-10-CM | POA: Diagnosis not present

## 2019-09-01 DIAGNOSIS — M6281 Muscle weakness (generalized): Secondary | ICD-10-CM | POA: Diagnosis not present

## 2019-09-01 DIAGNOSIS — Z9181 History of falling: Secondary | ICD-10-CM | POA: Diagnosis not present

## 2019-09-01 DIAGNOSIS — M79661 Pain in right lower leg: Secondary | ICD-10-CM | POA: Diagnosis not present

## 2019-09-01 DIAGNOSIS — R1319 Other dysphagia: Secondary | ICD-10-CM | POA: Diagnosis not present

## 2019-09-01 DIAGNOSIS — M24571 Contracture, right ankle: Secondary | ICD-10-CM | POA: Diagnosis not present

## 2019-09-01 DIAGNOSIS — R2689 Other abnormalities of gait and mobility: Secondary | ICD-10-CM | POA: Diagnosis not present

## 2019-09-01 DIAGNOSIS — G9341 Metabolic encephalopathy: Secondary | ICD-10-CM | POA: Diagnosis not present

## 2019-09-01 DIAGNOSIS — R2681 Unsteadiness on feet: Secondary | ICD-10-CM | POA: Diagnosis not present

## 2019-09-01 DIAGNOSIS — J9601 Acute respiratory failure with hypoxia: Secondary | ICD-10-CM | POA: Diagnosis not present

## 2019-09-01 DIAGNOSIS — R1312 Dysphagia, oropharyngeal phase: Secondary | ICD-10-CM | POA: Diagnosis not present

## 2019-09-01 DIAGNOSIS — R293 Abnormal posture: Secondary | ICD-10-CM | POA: Diagnosis not present

## 2019-09-01 DIAGNOSIS — M2450 Contracture, unspecified joint: Secondary | ICD-10-CM | POA: Diagnosis not present

## 2019-09-02 DIAGNOSIS — M2450 Contracture, unspecified joint: Secondary | ICD-10-CM | POA: Diagnosis not present

## 2019-09-02 DIAGNOSIS — R293 Abnormal posture: Secondary | ICD-10-CM | POA: Diagnosis not present

## 2019-09-02 DIAGNOSIS — R2681 Unsteadiness on feet: Secondary | ICD-10-CM | POA: Diagnosis not present

## 2019-09-02 DIAGNOSIS — M24561 Contracture, right knee: Secondary | ICD-10-CM | POA: Diagnosis not present

## 2019-09-02 DIAGNOSIS — M24571 Contracture, right ankle: Secondary | ICD-10-CM | POA: Diagnosis not present

## 2019-09-02 DIAGNOSIS — R278 Other lack of coordination: Secondary | ICD-10-CM | POA: Diagnosis not present

## 2019-09-02 DIAGNOSIS — M79662 Pain in left lower leg: Secondary | ICD-10-CM | POA: Diagnosis not present

## 2019-09-02 DIAGNOSIS — J9601 Acute respiratory failure with hypoxia: Secondary | ICD-10-CM | POA: Diagnosis not present

## 2019-09-02 DIAGNOSIS — R2689 Other abnormalities of gait and mobility: Secondary | ICD-10-CM | POA: Diagnosis not present

## 2019-09-02 DIAGNOSIS — Z9181 History of falling: Secondary | ICD-10-CM | POA: Diagnosis not present

## 2019-09-02 DIAGNOSIS — R1319 Other dysphagia: Secondary | ICD-10-CM | POA: Diagnosis not present

## 2019-09-02 DIAGNOSIS — R1312 Dysphagia, oropharyngeal phase: Secondary | ICD-10-CM | POA: Diagnosis not present

## 2019-09-02 DIAGNOSIS — Z7409 Other reduced mobility: Secondary | ICD-10-CM | POA: Diagnosis not present

## 2019-09-02 DIAGNOSIS — J690 Pneumonitis due to inhalation of food and vomit: Secondary | ICD-10-CM | POA: Diagnosis not present

## 2019-09-02 DIAGNOSIS — M6281 Muscle weakness (generalized): Secondary | ICD-10-CM | POA: Diagnosis not present

## 2019-09-02 DIAGNOSIS — M79661 Pain in right lower leg: Secondary | ICD-10-CM | POA: Diagnosis not present

## 2019-09-02 DIAGNOSIS — G9341 Metabolic encephalopathy: Secondary | ICD-10-CM | POA: Diagnosis not present

## 2019-09-03 DIAGNOSIS — R293 Abnormal posture: Secondary | ICD-10-CM | POA: Diagnosis not present

## 2019-09-03 DIAGNOSIS — Z9181 History of falling: Secondary | ICD-10-CM | POA: Diagnosis not present

## 2019-09-03 DIAGNOSIS — R278 Other lack of coordination: Secondary | ICD-10-CM | POA: Diagnosis not present

## 2019-09-03 DIAGNOSIS — M24571 Contracture, right ankle: Secondary | ICD-10-CM | POA: Diagnosis not present

## 2019-09-03 DIAGNOSIS — M2450 Contracture, unspecified joint: Secondary | ICD-10-CM | POA: Diagnosis not present

## 2019-09-03 DIAGNOSIS — R2689 Other abnormalities of gait and mobility: Secondary | ICD-10-CM | POA: Diagnosis not present

## 2019-09-03 DIAGNOSIS — J9601 Acute respiratory failure with hypoxia: Secondary | ICD-10-CM | POA: Diagnosis not present

## 2019-09-03 DIAGNOSIS — R2681 Unsteadiness on feet: Secondary | ICD-10-CM | POA: Diagnosis not present

## 2019-09-03 DIAGNOSIS — M79661 Pain in right lower leg: Secondary | ICD-10-CM | POA: Diagnosis not present

## 2019-09-03 DIAGNOSIS — R1319 Other dysphagia: Secondary | ICD-10-CM | POA: Diagnosis not present

## 2019-09-03 DIAGNOSIS — R1312 Dysphagia, oropharyngeal phase: Secondary | ICD-10-CM | POA: Diagnosis not present

## 2019-09-03 DIAGNOSIS — Z7409 Other reduced mobility: Secondary | ICD-10-CM | POA: Diagnosis not present

## 2019-09-03 DIAGNOSIS — J690 Pneumonitis due to inhalation of food and vomit: Secondary | ICD-10-CM | POA: Diagnosis not present

## 2019-09-03 DIAGNOSIS — M79662 Pain in left lower leg: Secondary | ICD-10-CM | POA: Diagnosis not present

## 2019-09-03 DIAGNOSIS — M6281 Muscle weakness (generalized): Secondary | ICD-10-CM | POA: Diagnosis not present

## 2019-09-03 DIAGNOSIS — B370 Candidal stomatitis: Secondary | ICD-10-CM | POA: Diagnosis not present

## 2019-09-03 DIAGNOSIS — G9341 Metabolic encephalopathy: Secondary | ICD-10-CM | POA: Diagnosis not present

## 2019-09-03 DIAGNOSIS — M24561 Contracture, right knee: Secondary | ICD-10-CM | POA: Diagnosis not present

## 2019-09-04 ENCOUNTER — Other Ambulatory Visit: Payer: Self-pay

## 2019-09-04 ENCOUNTER — Inpatient Hospital Stay (HOSPITAL_COMMUNITY)
Admission: EM | Admit: 2019-09-04 | Discharge: 2019-09-15 | DRG: 871 | Disposition: A | Discharge status: Hospice | Payer: Medicare Other | Source: Skilled Nursing Facility | Attending: Internal Medicine | Admitting: Internal Medicine

## 2019-09-04 ENCOUNTER — Emergency Department (HOSPITAL_COMMUNITY): Payer: Medicare Other

## 2019-09-04 ENCOUNTER — Encounter (HOSPITAL_COMMUNITY): Payer: Self-pay | Admitting: Emergency Medicine

## 2019-09-04 DIAGNOSIS — Z1612 Extended spectrum beta lactamase (ESBL) resistance: Secondary | ICD-10-CM | POA: Diagnosis present

## 2019-09-04 DIAGNOSIS — R52 Pain, unspecified: Secondary | ICD-10-CM | POA: Diagnosis not present

## 2019-09-04 DIAGNOSIS — L89622 Pressure ulcer of left heel, stage 2: Secondary | ICD-10-CM | POA: Diagnosis present

## 2019-09-04 DIAGNOSIS — I10 Essential (primary) hypertension: Secondary | ICD-10-CM | POA: Diagnosis present

## 2019-09-04 DIAGNOSIS — G9341 Metabolic encephalopathy: Secondary | ICD-10-CM | POA: Diagnosis present

## 2019-09-04 DIAGNOSIS — N179 Acute kidney failure, unspecified: Secondary | ICD-10-CM | POA: Diagnosis not present

## 2019-09-04 DIAGNOSIS — R0602 Shortness of breath: Secondary | ICD-10-CM

## 2019-09-04 DIAGNOSIS — R0902 Hypoxemia: Secondary | ICD-10-CM | POA: Diagnosis not present

## 2019-09-04 DIAGNOSIS — I739 Peripheral vascular disease, unspecified: Secondary | ICD-10-CM | POA: Diagnosis not present

## 2019-09-04 DIAGNOSIS — R627 Adult failure to thrive: Secondary | ICD-10-CM | POA: Diagnosis present

## 2019-09-04 DIAGNOSIS — Z7189 Other specified counseling: Secondary | ICD-10-CM | POA: Diagnosis not present

## 2019-09-04 DIAGNOSIS — L89153 Pressure ulcer of sacral region, stage 3: Secondary | ICD-10-CM | POA: Diagnosis not present

## 2019-09-04 DIAGNOSIS — J690 Pneumonitis due to inhalation of food and vomit: Secondary | ICD-10-CM | POA: Diagnosis not present

## 2019-09-04 DIAGNOSIS — F0390 Unspecified dementia without behavioral disturbance: Secondary | ICD-10-CM | POA: Diagnosis not present

## 2019-09-04 DIAGNOSIS — J9621 Acute and chronic respiratory failure with hypoxia: Secondary | ICD-10-CM | POA: Diagnosis not present

## 2019-09-04 DIAGNOSIS — M2450 Contracture, unspecified joint: Secondary | ICD-10-CM | POA: Diagnosis not present

## 2019-09-04 DIAGNOSIS — J9601 Acute respiratory failure with hypoxia: Secondary | ICD-10-CM | POA: Diagnosis present

## 2019-09-04 DIAGNOSIS — Z66 Do not resuscitate: Secondary | ICD-10-CM | POA: Diagnosis not present

## 2019-09-04 DIAGNOSIS — A4151 Sepsis due to Escherichia coli [E. coli]: Secondary | ICD-10-CM | POA: Diagnosis not present

## 2019-09-04 DIAGNOSIS — Z7982 Long term (current) use of aspirin: Secondary | ICD-10-CM

## 2019-09-04 DIAGNOSIS — R293 Abnormal posture: Secondary | ICD-10-CM | POA: Diagnosis not present

## 2019-09-04 DIAGNOSIS — R1319 Other dysphagia: Secondary | ICD-10-CM | POA: Diagnosis not present

## 2019-09-04 DIAGNOSIS — G9340 Encephalopathy, unspecified: Secondary | ICD-10-CM | POA: Diagnosis not present

## 2019-09-04 DIAGNOSIS — J9811 Atelectasis: Secondary | ICD-10-CM

## 2019-09-04 DIAGNOSIS — G40909 Epilepsy, unspecified, not intractable, without status epilepticus: Secondary | ICD-10-CM

## 2019-09-04 DIAGNOSIS — Z9181 History of falling: Secondary | ICD-10-CM | POA: Diagnosis not present

## 2019-09-04 DIAGNOSIS — J9600 Acute respiratory failure, unspecified whether with hypoxia or hypercapnia: Secondary | ICD-10-CM

## 2019-09-04 DIAGNOSIS — J9602 Acute respiratory failure with hypercapnia: Secondary | ICD-10-CM | POA: Diagnosis not present

## 2019-09-04 DIAGNOSIS — R0689 Other abnormalities of breathing: Secondary | ICD-10-CM | POA: Diagnosis not present

## 2019-09-04 DIAGNOSIS — L89152 Pressure ulcer of sacral region, stage 2: Secondary | ICD-10-CM | POA: Diagnosis present

## 2019-09-04 DIAGNOSIS — R5381 Other malaise: Secondary | ICD-10-CM | POA: Diagnosis present

## 2019-09-04 DIAGNOSIS — Z515 Encounter for palliative care: Secondary | ICD-10-CM | POA: Diagnosis not present

## 2019-09-04 DIAGNOSIS — M79661 Pain in right lower leg: Secondary | ICD-10-CM | POA: Diagnosis not present

## 2019-09-04 DIAGNOSIS — Z8673 Personal history of transient ischemic attack (TIA), and cerebral infarction without residual deficits: Secondary | ICD-10-CM

## 2019-09-04 DIAGNOSIS — A419 Sepsis, unspecified organism: Secondary | ICD-10-CM | POA: Diagnosis not present

## 2019-09-04 DIAGNOSIS — E872 Acidosis: Secondary | ICD-10-CM | POA: Diagnosis present

## 2019-09-04 DIAGNOSIS — M79662 Pain in left lower leg: Secondary | ICD-10-CM | POA: Diagnosis not present

## 2019-09-04 DIAGNOSIS — Z79899 Other long term (current) drug therapy: Secondary | ICD-10-CM

## 2019-09-04 DIAGNOSIS — Z978 Presence of other specified devices: Secondary | ICD-10-CM

## 2019-09-04 DIAGNOSIS — M6281 Muscle weakness (generalized): Secondary | ICD-10-CM | POA: Diagnosis not present

## 2019-09-04 DIAGNOSIS — R278 Other lack of coordination: Secondary | ICD-10-CM | POA: Diagnosis not present

## 2019-09-04 DIAGNOSIS — R569 Unspecified convulsions: Secondary | ICD-10-CM | POA: Diagnosis not present

## 2019-09-04 DIAGNOSIS — R404 Transient alteration of awareness: Secondary | ICD-10-CM | POA: Diagnosis not present

## 2019-09-04 DIAGNOSIS — Z20822 Contact with and (suspected) exposure to covid-19: Secondary | ICD-10-CM | POA: Diagnosis present

## 2019-09-04 DIAGNOSIS — Z4659 Encounter for fitting and adjustment of other gastrointestinal appliance and device: Secondary | ICD-10-CM

## 2019-09-04 DIAGNOSIS — E876 Hypokalemia: Secondary | ICD-10-CM | POA: Diagnosis not present

## 2019-09-04 DIAGNOSIS — M24561 Contracture, right knee: Secondary | ICD-10-CM | POA: Diagnosis not present

## 2019-09-04 DIAGNOSIS — R2681 Unsteadiness on feet: Secondary | ICD-10-CM | POA: Diagnosis not present

## 2019-09-04 DIAGNOSIS — J449 Chronic obstructive pulmonary disease, unspecified: Secondary | ICD-10-CM | POA: Diagnosis present

## 2019-09-04 DIAGNOSIS — N390 Urinary tract infection, site not specified: Secondary | ICD-10-CM | POA: Diagnosis present

## 2019-09-04 DIAGNOSIS — Z681 Body mass index (BMI) 19 or less, adult: Secondary | ICD-10-CM | POA: Diagnosis not present

## 2019-09-04 DIAGNOSIS — R64 Cachexia: Secondary | ICD-10-CM | POA: Diagnosis not present

## 2019-09-04 DIAGNOSIS — L89612 Pressure ulcer of right heel, stage 2: Secondary | ICD-10-CM | POA: Diagnosis present

## 2019-09-04 DIAGNOSIS — M24571 Contracture, right ankle: Secondary | ICD-10-CM | POA: Diagnosis not present

## 2019-09-04 DIAGNOSIS — J9622 Acute and chronic respiratory failure with hypercapnia: Secondary | ICD-10-CM | POA: Diagnosis present

## 2019-09-04 DIAGNOSIS — R1312 Dysphagia, oropharyngeal phase: Secondary | ICD-10-CM | POA: Diagnosis not present

## 2019-09-04 DIAGNOSIS — L89890 Pressure ulcer of other site, unstageable: Secondary | ICD-10-CM | POA: Diagnosis present

## 2019-09-04 DIAGNOSIS — E43 Unspecified severe protein-calorie malnutrition: Secondary | ICD-10-CM | POA: Diagnosis not present

## 2019-09-04 DIAGNOSIS — Z87891 Personal history of nicotine dependence: Secondary | ICD-10-CM | POA: Diagnosis not present

## 2019-09-04 DIAGNOSIS — R6520 Severe sepsis without septic shock: Secondary | ICD-10-CM | POA: Diagnosis not present

## 2019-09-04 DIAGNOSIS — Z7409 Other reduced mobility: Secondary | ICD-10-CM | POA: Diagnosis not present

## 2019-09-04 DIAGNOSIS — G40409 Other generalized epilepsy and epileptic syndromes, not intractable, without status epilepticus: Secondary | ICD-10-CM | POA: Diagnosis present

## 2019-09-04 DIAGNOSIS — R531 Weakness: Secondary | ICD-10-CM | POA: Diagnosis not present

## 2019-09-04 DIAGNOSIS — R2689 Other abnormalities of gait and mobility: Secondary | ICD-10-CM | POA: Diagnosis not present

## 2019-09-04 DIAGNOSIS — Z7401 Bed confinement status: Secondary | ICD-10-CM

## 2019-09-04 LAB — CBC WITH DIFFERENTIAL/PLATELET
Abs Immature Granulocytes: 0 10*3/uL (ref 0.00–0.07)
Band Neutrophils: 16 %
Basophils Absolute: 0 10*3/uL (ref 0.0–0.1)
Basophils Relative: 0 %
Eosinophils Absolute: 0 10*3/uL (ref 0.0–0.5)
Eosinophils Relative: 0 %
HCT: 52.7 % — ABNORMAL HIGH (ref 39.0–52.0)
Hemoglobin: 16.7 g/dL (ref 13.0–17.0)
Lymphocytes Relative: 12 %
Lymphs Abs: 1.1 10*3/uL (ref 0.7–4.0)
MCH: 31.5 pg (ref 26.0–34.0)
MCHC: 31.7 g/dL (ref 30.0–36.0)
MCV: 99.2 fL (ref 80.0–100.0)
Monocytes Absolute: 0.4 10*3/uL (ref 0.1–1.0)
Monocytes Relative: 4 %
Neutro Abs: 7.4 10*3/uL (ref 1.7–7.7)
Neutrophils Relative %: 68 %
Platelets: 328 10*3/uL (ref 150–400)
RBC: 5.31 MIL/uL (ref 4.22–5.81)
RDW: 15.6 % — ABNORMAL HIGH (ref 11.5–15.5)
WBC: 8.8 10*3/uL (ref 4.0–10.5)
nRBC: 0 % (ref 0.0–0.2)

## 2019-09-04 LAB — COMPREHENSIVE METABOLIC PANEL
ALT: 31 U/L (ref 0–44)
AST: 44 U/L — ABNORMAL HIGH (ref 15–41)
Albumin: 3.3 g/dL — ABNORMAL LOW (ref 3.5–5.0)
Alkaline Phosphatase: 123 U/L (ref 38–126)
Anion gap: 13 (ref 5–15)
BUN: 12 mg/dL (ref 8–23)
CO2: 25 mmol/L (ref 22–32)
Calcium: 8.9 mg/dL (ref 8.9–10.3)
Chloride: 101 mmol/L (ref 98–111)
Creatinine, Ser: 0.63 mg/dL (ref 0.61–1.24)
GFR calc Af Amer: >60 mL/min (ref >60)
GFR calc non Af Amer: >60 mL/min (ref >60)
Glucose, Bld: 121 mg/dL — ABNORMAL HIGH (ref 70–99)
Potassium: 3.9 mmol/L (ref 3.5–5.1)
Sodium: 139 mmol/L (ref 135–145)
Total Bilirubin: 0.2 mg/dL — ABNORMAL LOW (ref 0.3–1.2)
Total Protein: 8.3 g/dL — ABNORMAL HIGH (ref 6.5–8.1)

## 2019-09-04 LAB — BLOOD GAS, ARTERIAL
Acid-Base Excess: 1.2 mmol/L (ref 0.0–2.0)
Bicarbonate: 27.9 mmol/L (ref 20.0–28.0)
O2 Saturation: 99.5 %
Patient temperature: 98.6
pCO2 arterial: 54 mmHg — ABNORMAL HIGH (ref 32.0–48.0)
pH, Arterial: 7.334 — ABNORMAL LOW (ref 7.350–7.450)
pO2, Arterial: 204 mmHg — ABNORMAL HIGH (ref 83.0–108.0)

## 2019-09-04 LAB — URINALYSIS, ROUTINE W REFLEX MICROSCOPIC
Bilirubin Urine: NEGATIVE
Glucose, UA: NEGATIVE mg/dL
Hgb urine dipstick: NEGATIVE
Ketones, ur: NEGATIVE mg/dL
Nitrite: NEGATIVE
Protein, ur: 30 mg/dL — AB
Specific Gravity, Urine: 1.025 (ref 1.005–1.030)
WBC, UA: >50 WBC/hpf — ABNORMAL HIGH (ref 0–5)
pH: 5 (ref 5.0–8.0)

## 2019-09-04 LAB — PROTIME-INR
INR: 1.1 (ref 0.8–1.2)
Prothrombin Time: 13.8 seconds (ref 11.4–15.2)

## 2019-09-04 LAB — LACTIC ACID, PLASMA: Lactic Acid, Venous: 3.7 mmol/L (ref 0.5–1.9)

## 2019-09-04 LAB — APTT: aPTT: 29 seconds (ref 24–36)

## 2019-09-04 MED ORDER — PROPOFOL 500 MG/50ML IV EMUL
INTRAVENOUS | Status: AC
  Administered 2019-09-04: 10 ug
  Filled 2019-09-04: qty 50

## 2019-09-04 MED ORDER — ACETAMINOPHEN 650 MG RE SUPP
650.0000 mg | Freq: Once | RECTAL | Status: AC
  Administered 2019-09-04: 650 mg via RECTAL
  Filled 2019-09-04: qty 1

## 2019-09-04 MED ORDER — SODIUM CHLORIDE 0.9 % IV SOLN
2.0000 g | Freq: Once | INTRAVENOUS | Status: AC
  Administered 2019-09-04: 2 g via INTRAVENOUS
  Filled 2019-09-04: qty 2

## 2019-09-04 MED ORDER — ETOMIDATE 2 MG/ML IV SOLN: 20.0000 mg | Freq: Once | INTRAVENOUS | Status: AC

## 2019-09-04 MED ORDER — ROCURONIUM BROMIDE 50 MG/5ML IV SOLN
61.0000 mg | Freq: Once | INTRAVENOUS | Status: DC
  Filled 2019-09-04: qty 6.1

## 2019-09-04 MED ORDER — PROPOFOL 1000 MG/100ML IV EMUL
5.0000 ug/kg/min | INTRAVENOUS | Status: DC
  Administered 2019-09-05 (×2): 40 ug/kg/min via INTRAVENOUS
  Administered 2019-09-05: 30 ug/kg/min via INTRAVENOUS
  Administered 2019-09-05 – 2019-09-06 (×2): 40 ug/kg/min via INTRAVENOUS
  Administered 2019-09-06: 5 ug/kg/min via INTRAVENOUS
  Filled 2019-09-04 (×6): qty 100

## 2019-09-04 MED ORDER — VANCOMYCIN HCL IN DEXTROSE 1-5 GM/200ML-% IV SOLN
1000.0000 mg | Freq: Once | INTRAVENOUS | Status: AC
  Administered 2019-09-04: 1000 mg via INTRAVENOUS
  Filled 2019-09-04: qty 200

## 2019-09-04 MED ORDER — STERILE WATER FOR INJECTION IJ SOLN
INTRAMUSCULAR | Status: AC
  Filled 2019-09-04: qty 10

## 2019-09-04 MED ORDER — ROCURONIUM BROMIDE 10 MG/ML (PF) SYRINGE
PREFILLED_SYRINGE | INTRAVENOUS | Status: AC
  Administered 2019-09-04: 60 mg
  Filled 2019-09-04: qty 10

## 2019-09-04 MED ORDER — ETOMIDATE 2 MG/ML IV SOLN
INTRAVENOUS | Status: AC
  Administered 2019-09-04: 20 mg via INTRAVENOUS
  Filled 2019-09-04: qty 20

## 2019-09-04 MED ORDER — SODIUM CHLORIDE 0.9 % IV BOLUS
1000.0000 mL | Freq: Once | INTRAVENOUS | Status: AC
  Administered 2019-09-04: 1000 mL via INTRAVENOUS
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
## 2019-09-04 NOTE — Sedation Documentation (Signed)
Patient placed on vent by respiratory.

## 2019-09-04 NOTE — ED Notes (Addendum)
Date and time results received: 09/04/19 2255 (use smartphrase ".now" to insert current time)  Test:Lactic Acid Critical Value: 3.7 Name of Provider Notified: goldston md  Orders Received? Or Actions Taken?: waiting on orders

## 2019-09-04 NOTE — Sedation Documentation (Signed)
Patient being intubated by Dr. Criss Alvine. Patient has 8.0 etube at 24 tooth. Change in color.

## 2019-09-04 NOTE — ED Triage Notes (Signed)
Patient brought from [facility] by [facility]. Patient is sob. Patient has been gurgling x 2 days per staff. Patient has rhonchi. Patient was 75% on room air and EMS put him on 15 L of oxgen and it brought it up to 94%. Patient has a history of aspiration.

## 2019-09-04 NOTE — ED Notes (Signed)
Could not obtain 2nd set of blood cultures prior to antibiotic administration.

## 2019-09-05 ENCOUNTER — Inpatient Hospital Stay (HOSPITAL_COMMUNITY): Payer: Medicare Other

## 2019-09-05 ENCOUNTER — Other Ambulatory Visit: Payer: Self-pay

## 2019-09-05 DIAGNOSIS — E872 Acidosis: Secondary | ICD-10-CM | POA: Diagnosis present

## 2019-09-05 DIAGNOSIS — I10 Essential (primary) hypertension: Secondary | ICD-10-CM | POA: Diagnosis present

## 2019-09-05 DIAGNOSIS — Z7982 Long term (current) use of aspirin: Secondary | ICD-10-CM | POA: Diagnosis not present

## 2019-09-05 DIAGNOSIS — J9811 Atelectasis: Secondary | ICD-10-CM | POA: Diagnosis not present

## 2019-09-05 DIAGNOSIS — J9622 Acute and chronic respiratory failure with hypercapnia: Secondary | ICD-10-CM | POA: Diagnosis present

## 2019-09-05 DIAGNOSIS — A4151 Sepsis due to Escherichia coli [E. coli]: Secondary | ICD-10-CM | POA: Diagnosis present

## 2019-09-05 DIAGNOSIS — N390 Urinary tract infection, site not specified: Secondary | ICD-10-CM | POA: Diagnosis present

## 2019-09-05 DIAGNOSIS — Z681 Body mass index (BMI) 19 or less, adult: Secondary | ICD-10-CM | POA: Diagnosis not present

## 2019-09-05 DIAGNOSIS — J9601 Acute respiratory failure with hypoxia: Secondary | ICD-10-CM | POA: Diagnosis present

## 2019-09-05 DIAGNOSIS — R0602 Shortness of breath: Secondary | ICD-10-CM | POA: Diagnosis not present

## 2019-09-05 DIAGNOSIS — Z79899 Other long term (current) drug therapy: Secondary | ICD-10-CM | POA: Diagnosis not present

## 2019-09-05 DIAGNOSIS — R6520 Severe sepsis without septic shock: Secondary | ICD-10-CM | POA: Diagnosis present

## 2019-09-05 DIAGNOSIS — Z87891 Personal history of nicotine dependence: Secondary | ICD-10-CM | POA: Diagnosis not present

## 2019-09-05 DIAGNOSIS — I739 Peripheral vascular disease, unspecified: Secondary | ICD-10-CM | POA: Diagnosis present

## 2019-09-05 DIAGNOSIS — E43 Unspecified severe protein-calorie malnutrition: Secondary | ICD-10-CM | POA: Diagnosis present

## 2019-09-05 DIAGNOSIS — L89153 Pressure ulcer of sacral region, stage 3: Secondary | ICD-10-CM | POA: Diagnosis present

## 2019-09-05 DIAGNOSIS — Z9911 Dependence on respirator [ventilator] status: Secondary | ICD-10-CM | POA: Diagnosis not present

## 2019-09-05 DIAGNOSIS — G9340 Encephalopathy, unspecified: Secondary | ICD-10-CM | POA: Diagnosis not present

## 2019-09-05 DIAGNOSIS — E876 Hypokalemia: Secondary | ICD-10-CM | POA: Diagnosis present

## 2019-09-05 DIAGNOSIS — N179 Acute kidney failure, unspecified: Secondary | ICD-10-CM | POA: Diagnosis not present

## 2019-09-05 DIAGNOSIS — Z4682 Encounter for fitting and adjustment of non-vascular catheter: Secondary | ICD-10-CM | POA: Diagnosis not present

## 2019-09-05 DIAGNOSIS — J9690 Respiratory failure, unspecified, unspecified whether with hypoxia or hypercapnia: Secondary | ICD-10-CM | POA: Diagnosis not present

## 2019-09-05 DIAGNOSIS — J690 Pneumonitis due to inhalation of food and vomit: Secondary | ICD-10-CM | POA: Diagnosis not present

## 2019-09-05 DIAGNOSIS — F0390 Unspecified dementia without behavioral disturbance: Secondary | ICD-10-CM | POA: Diagnosis present

## 2019-09-05 DIAGNOSIS — R64 Cachexia: Secondary | ICD-10-CM | POA: Diagnosis present

## 2019-09-05 DIAGNOSIS — R627 Adult failure to thrive: Secondary | ICD-10-CM | POA: Diagnosis not present

## 2019-09-05 DIAGNOSIS — G9341 Metabolic encephalopathy: Secondary | ICD-10-CM | POA: Diagnosis not present

## 2019-09-05 DIAGNOSIS — J9621 Acute and chronic respiratory failure with hypoxia: Secondary | ICD-10-CM | POA: Diagnosis not present

## 2019-09-05 DIAGNOSIS — Z1612 Extended spectrum beta lactamase (ESBL) resistance: Secondary | ICD-10-CM | POA: Diagnosis present

## 2019-09-05 DIAGNOSIS — Z66 Do not resuscitate: Secondary | ICD-10-CM | POA: Diagnosis present

## 2019-09-05 DIAGNOSIS — R918 Other nonspecific abnormal finding of lung field: Secondary | ICD-10-CM | POA: Diagnosis not present

## 2019-09-05 DIAGNOSIS — J9602 Acute respiratory failure with hypercapnia: Secondary | ICD-10-CM | POA: Diagnosis not present

## 2019-09-05 DIAGNOSIS — Z20822 Contact with and (suspected) exposure to covid-19: Secondary | ICD-10-CM | POA: Diagnosis present

## 2019-09-05 DIAGNOSIS — G40409 Other generalized epilepsy and epileptic syndromes, not intractable, without status epilepticus: Secondary | ICD-10-CM | POA: Diagnosis present

## 2019-09-05 DIAGNOSIS — R569 Unspecified convulsions: Secondary | ICD-10-CM | POA: Diagnosis not present

## 2019-09-05 DIAGNOSIS — Z9889 Other specified postprocedural states: Secondary | ICD-10-CM | POA: Diagnosis not present

## 2019-09-05 DIAGNOSIS — Z515 Encounter for palliative care: Secondary | ICD-10-CM | POA: Diagnosis not present

## 2019-09-05 LAB — COMPREHENSIVE METABOLIC PANEL
ALT: 25 U/L (ref 0–44)
AST: 32 U/L (ref 15–41)
Albumin: 2.6 g/dL — ABNORMAL LOW (ref 3.5–5.0)
Alkaline Phosphatase: 94 U/L (ref 38–126)
Anion gap: 9 (ref 5–15)
BUN: 12 mg/dL (ref 8–23)
CO2: 23 mmol/L (ref 22–32)
Calcium: 8.5 mg/dL — ABNORMAL LOW (ref 8.9–10.3)
Chloride: 106 mmol/L (ref 98–111)
Creatinine, Ser: 0.48 mg/dL — ABNORMAL LOW (ref 0.61–1.24)
GFR calc Af Amer: >60 mL/min (ref >60)
GFR calc non Af Amer: >60 mL/min (ref >60)
Glucose, Bld: 96 mg/dL (ref 70–99)
Potassium: 3.9 mmol/L (ref 3.5–5.1)
Sodium: 138 mmol/L (ref 135–145)
Total Bilirubin: 0.4 mg/dL (ref 0.3–1.2)
Total Protein: 6.5 g/dL (ref 6.5–8.1)

## 2019-09-05 LAB — BLOOD GAS, ARTERIAL
Acid-Base Excess: 1.3 mmol/L (ref 0.0–2.0)
Acid-Base Excess: 2.1 mmol/L — ABNORMAL HIGH (ref 0.0–2.0)
Bicarbonate: 25.7 mmol/L (ref 20.0–28.0)
Bicarbonate: 26.2 mmol/L (ref 20.0–28.0)
Drawn by: 23281
Drawn by: 23281
FIO2: 70
FIO2: 50
MECHVT: 600 mL
MECHVT: 600 mL
O2 Saturation: 99.6 %
O2 Saturation: 98.6 %
PEEP: 5 cmH2O
PEEP: 5 cmH2O
Patient temperature: 99.9
Patient temperature: 98.4
RATE: 20 resp/min
RATE: 20 resp/min
pCO2 arterial: 43.4 mmHg (ref 32.0–48.0)
pCO2 arterial: 40.8 mmHg (ref 32.0–48.0)
pH, Arterial: 7.394 (ref 7.350–7.450)
pH, Arterial: 7.423 (ref 7.350–7.450)
pO2, Arterial: 179 mmHg — ABNORMAL HIGH (ref 83.0–108.0)
pO2, Arterial: 115 mmHg — ABNORMAL HIGH (ref 83.0–108.0)

## 2019-09-05 LAB — TRIGLYCERIDES: Triglycerides: 77 mg/dL (ref <150)

## 2019-09-05 LAB — CBC
HCT: 40.2 % (ref 39.0–52.0)
Hemoglobin: 13.3 g/dL (ref 13.0–17.0)
MCH: 32 pg (ref 26.0–34.0)
MCHC: 33.1 g/dL (ref 30.0–36.0)
MCV: 96.6 fL (ref 80.0–100.0)
Platelets: 251 10*3/uL (ref 150–400)
RBC: 4.16 MIL/uL — ABNORMAL LOW (ref 4.22–5.81)
RDW: 15.6 % — ABNORMAL HIGH (ref 11.5–15.5)
WBC: 10 10*3/uL (ref 4.0–10.5)
nRBC: 0 % (ref 0.0–0.2)

## 2019-09-05 LAB — GLUCOSE, CAPILLARY
Glucose-Capillary: 80 mg/dL (ref 70–99)
Glucose-Capillary: 83 mg/dL (ref 70–99)

## 2019-09-05 LAB — MAGNESIUM: Magnesium: 1.8 mg/dL (ref 1.7–2.4)

## 2019-09-05 LAB — SARS CORONAVIRUS 2 BY RT PCR (HOSPITAL ORDER, PERFORMED IN ~~LOC~~ HOSPITAL LAB): SARS Coronavirus 2: NEGATIVE

## 2019-09-05 LAB — CARBAMAZEPINE LEVEL, TOTAL: Carbamazepine Lvl: 6.5 ug/mL (ref 4.0–12.0)

## 2019-09-05 LAB — PHOSPHORUS: Phosphorus: 2.5 mg/dL (ref 2.5–4.6)

## 2019-09-05 LAB — VALPROIC ACID LEVEL: Valproic Acid Lvl: 11 ug/mL — ABNORMAL LOW (ref 50.0–100.0)

## 2019-09-05 LAB — LACTIC ACID, PLASMA: Lactic Acid, Venous: 2.4 mmol/L (ref 0.5–1.9)

## 2019-09-05 MED ORDER — ASPIRIN 81 MG PO CHEW
81.0000 mg | CHEWABLE_TABLET | Freq: Every day | ORAL | Status: DC
  Administered 2019-09-05 – 2019-09-12 (×8): 81 mg
  Filled 2019-09-05 (×8): qty 1

## 2019-09-05 MED ORDER — ACETAMINOPHEN 325 MG PO TABS: 650.0000 mg | ORAL_TABLET | ORAL | Status: DC | PRN

## 2019-09-05 MED ORDER — CHLORHEXIDINE GLUCONATE CLOTH 2 % EX PADS
6.0000 | MEDICATED_PAD | Freq: Every day | CUTANEOUS | Status: DC
  Administered 2019-09-05 – 2019-09-15 (×9): 6 via TOPICAL

## 2019-09-05 MED ORDER — CHLORHEXIDINE GLUCONATE 0.12% ORAL RINSE (MEDLINE KIT)
15.0000 mL | Freq: Two times a day (BID) | OROMUCOSAL | Status: DC
  Administered 2019-09-05 – 2019-09-15 (×17): 15 mL via OROMUCOSAL

## 2019-09-05 MED ORDER — VITAL HIGH PROTEIN PO LIQD
1000.0000 mL | ORAL | Status: DC
  Administered 2019-09-05 – 2019-09-06 (×2): 1000 mL

## 2019-09-05 MED ORDER — ALBUTEROL SULFATE (2.5 MG/3ML) 0.083% IN NEBU: 2.5000 mg | INHALATION_SOLUTION | RESPIRATORY_TRACT | Status: DC | PRN

## 2019-09-05 MED ORDER — HYPROMELLOSE (GONIOSCOPIC) 2.5 % OP SOLN: 2.0000 [drp] | Freq: Two times a day (BID) | OPHTHALMIC | Status: DC

## 2019-09-05 MED ORDER — IPRATROPIUM-ALBUTEROL 0.5-2.5 (3) MG/3ML IN SOLN
3.0000 mL | Freq: Four times a day (QID) | RESPIRATORY_TRACT | Status: DC
  Administered 2019-09-05 – 2019-09-12 (×27): 3 mL via RESPIRATORY_TRACT
  Filled 2019-09-05 (×29): qty 3

## 2019-09-05 MED ORDER — VALPROIC ACID 250 MG/5ML PO SOLN
750.0000 mg | Freq: Two times a day (BID) | ORAL | Status: DC
  Administered 2019-09-05 – 2019-09-06 (×4): 750 mg via ORAL
  Filled 2019-09-05 (×5): qty 15

## 2019-09-05 MED ORDER — DEXTRAN 70-HYPROMELLOSE (PF) 0.1-0.3 % OP SOLN: 2.0000 [drp] | Freq: Two times a day (BID) | OPHTHALMIC | Status: DC

## 2019-09-05 MED ORDER — ORAL CARE MOUTH RINSE
15.0000 mL | OROMUCOSAL | Status: DC
  Administered 2019-09-05 – 2019-09-15 (×78): 15 mL via OROMUCOSAL

## 2019-09-05 MED ORDER — DOCUSATE SODIUM 50 MG/5ML PO LIQD
100.0000 mg | Freq: Two times a day (BID) | ORAL | Status: DC | PRN
  Administered 2019-09-06: 100 mg
  Filled 2019-09-05 (×2): qty 10

## 2019-09-05 MED ORDER — MAGNESIUM SULFATE 2 GM/50ML IV SOLN
2.0000 g | Freq: Once | INTRAVENOUS | Status: AC
  Administered 2019-09-05: 2 g via INTRAVENOUS
  Filled 2019-09-05: qty 50

## 2019-09-05 MED ORDER — PROSOURCE TF PO LIQD
45.0000 mL | Freq: Two times a day (BID) | ORAL | Status: DC
  Administered 2019-09-05 – 2019-09-07 (×4): 45 mL
  Filled 2019-09-05 (×4): qty 45

## 2019-09-05 MED ORDER — LEVETIRACETAM 100 MG/ML PO SOLN
1500.0000 mg | Freq: Two times a day (BID) | ORAL | Status: DC
  Administered 2019-09-06 – 2019-09-14 (×18): 1500 mg
  Filled 2019-09-05 (×19): qty 15

## 2019-09-05 MED ORDER — POLYETHYLENE GLYCOL 3350 17 G PO PACK: 17.0000 g | PACK | Freq: Every day | ORAL | Status: DC | PRN

## 2019-09-05 MED ORDER — POLYETHYLENE GLYCOL 3350 17 G PO PACK
17.0000 g | PACK | Freq: Every day | ORAL | Status: DC
  Administered 2019-09-05 – 2019-09-09 (×5): 17 g via ORAL
  Filled 2019-09-05 (×5): qty 1

## 2019-09-05 MED ORDER — AMLODIPINE BESYLATE 5 MG PO TABS: 5.0000 mg | ORAL_TABLET | Freq: Every day | ORAL | Status: DC

## 2019-09-05 MED ORDER — VANCOMYCIN HCL 750 MG/150ML IV SOLN
750.0000 mg | Freq: Two times a day (BID) | INTRAVENOUS | Status: DC
  Administered 2019-09-05 – 2019-09-06 (×3): 750 mg via INTRAVENOUS
  Filled 2019-09-05 (×4): qty 150

## 2019-09-05 MED ORDER — ENOXAPARIN SODIUM 40 MG/0.4ML ~~LOC~~ SOLN
40.0000 mg | SUBCUTANEOUS | Status: DC
  Administered 2019-09-05 – 2019-09-15 (×11): 40 mg via SUBCUTANEOUS
  Filled 2019-09-05 (×11): qty 0.4

## 2019-09-05 MED ORDER — CARBAMAZEPINE 100 MG/5ML PO SUSP: 300.0000 mg | Freq: Three times a day (TID) | ORAL | Status: DC

## 2019-09-05 MED ORDER — CARBAMAZEPINE 100 MG/5ML PO SUSP
400.0000 mg | Freq: Three times a day (TID) | ORAL | Status: DC
  Administered 2019-09-06 – 2019-09-14 (×25): 400 mg
  Filled 2019-09-05 (×35): qty 20

## 2019-09-05 MED ORDER — DOCUSATE SODIUM 50 MG/5ML PO LIQD
100.0000 mg | Freq: Two times a day (BID) | ORAL | Status: DC
  Administered 2019-09-05 – 2019-09-09 (×9): 100 mg via ORAL
  Filled 2019-09-05 (×9): qty 10

## 2019-09-05 MED ORDER — SIMVASTATIN 10 MG PO TABS
10.0000 mg | ORAL_TABLET | Freq: Every day | ORAL | Status: DC
  Administered 2019-09-06 – 2019-09-11 (×6): 10 mg
  Filled 2019-09-05 (×6): qty 1

## 2019-09-05 MED ORDER — PANTOPRAZOLE SODIUM 40 MG PO PACK
40.0000 mg | PACK | Freq: Every day | ORAL | Status: DC
  Administered 2019-09-05 – 2019-09-14 (×10): 40 mg
  Filled 2019-09-05 (×11): qty 20

## 2019-09-05 MED ORDER — LACOSAMIDE 50 MG PO TABS
300.0000 mg | ORAL_TABLET | Freq: Two times a day (BID) | ORAL | Status: DC
  Administered 2019-09-06 – 2019-09-14 (×18): 300 mg
  Filled 2019-09-05 (×18): qty 6

## 2019-09-05 MED ORDER — FENTANYL CITRATE (PF) 100 MCG/2ML IJ SOLN: 25.0000 ug | INTRAMUSCULAR | Status: DC | PRN

## 2019-09-05 MED ORDER — CLONAZEPAM 0.25 MG PO TBDP: 0.2500 mg | ORAL_TABLET | ORAL | Status: DC

## 2019-09-05 MED ORDER — POLYVINYL ALCOHOL 1.4 % OP SOLN
2.0000 [drp] | Freq: Two times a day (BID) | OPHTHALMIC | Status: DC
  Administered 2019-09-05 – 2019-09-14 (×15): 2 [drp] via OPHTHALMIC
  Filled 2019-09-05 (×2): qty 15

## 2019-09-05 MED ORDER — PIPERACILLIN-TAZOBACTAM 3.375 G IVPB
3.3750 g | Freq: Three times a day (TID) | INTRAVENOUS | Status: DC
  Administered 2019-09-05 – 2019-09-07 (×7): 3.375 g via INTRAVENOUS
  Filled 2019-09-05 (×7): qty 50

## 2019-09-05 MED ORDER — FENTANYL CITRATE (PF) 100 MCG/2ML IJ SOLN
25.0000 ug | INTRAMUSCULAR | Status: DC | PRN
  Administered 2019-09-06: 25 ug via INTRAVENOUS
  Filled 2019-09-05: qty 2

## 2019-09-05 NOTE — Progress Notes (Signed)
Pharmacy Antibiotic Note  Lee [name] is a 69 y.o. male admitted on 09/04/2019 with sepsis.  Pharmacy has been consulted for vancomycin and Zosyn dosing.  Plan:  Zosyn 3.375g IV q8h (4 hour infusion).   Vancomycin 1000 mg IV x1, then 750 mg IV q12h  Monitor clinical course, renal function, cultures as available   Height: 5\' 11"  (180.3 cm) Weight: 61.5 kg (135 lb 9.3 oz) IBW/kg (Calculated) : 75.3  Temp (24hrs), Avg:99.9 F (37.7 C), Min:99.1 F (37.3 C), Max:100.6 F (38.1 C)  Recent Labs  Lab 09/04/19 2141 09/05/19 0035  WBC 8.8  --   CREATININE 0.63  --   LATICACIDVEN 3.7* 2.4*    Estimated Creatinine Clearance: 74.7 mL/min (by C-G formula based on SCr of 0.63 mg/dL).    Allergies  Allergen Reactions  . Orange Juice [Orange Oil] Diarrhea  . Vicodin [Hydrocodone-Acetaminophen] Other (See Comments)    "triggered seizure both here and at home when he tried to take it" (01/22/2012)  . Penicillins Nausea And Vomiting    Tolerates Zosyn and Unasyn. Has patient had a PCN reaction causing immediate rash, facial/tongue/throat swelling, SOB or lightheadedness with hypotension: no Has patient had a PCN reaction causing severe rash involving mucus membranes or skin necrosis: no Has patient had a PCN reaction that required hospitalization: no Has patient had a PCN reaction occurring within the last 10 years: no If all of the above answers are "NO", then may proceed with Cephalosporin u  . Oxycodone     Causes Seizures  . Vicodin [Hydrocodone-Acetaminophen]     Antimicrobials this admission: Cefepime 8/27 x1   Vancomycin 8/27 >>  Zosyn 8/28 >>   Dose adjustments this admission:   Microbiology results: 8/27 BCx:  8/27 UCx:   8/28 Sputum:      Thank you for allowing pharmacy to be a part of this patient's care.    9/28, PharmD, BCPS 09/05/2019 2:22 AM

## 2019-09-05 NOTE — ED Notes (Signed)
Assumed care of pt. Pt intubated. Pt in no apparent distress or pain. Awaiting further orders.

## 2019-09-05 NOTE — Progress Notes (Signed)
NAME:  Lee [name], MRN:  [id number], DOB:  May 20, 1950, LOS: 0 ADMISSION DATE:  09/04/2019, CONSULTATION DATE:  09/05/2019 REFERRING MD:  EDP, CHIEF COMPLAINT: Respiratory failure  Brief History    69 year old man with history of dementia, reported COPD, seizure disorder, hypertension, PVD, CVA, aspiration, recurrent UTI presenting from [facility] with dyspnea found to have acute hypoxic respiratory failure.  At his facility he was found to be 75% on room air.  EMS placed him on nonrebreather.  Unable to obtain history from patient due to intubation/acute encephalopathy.  History obtained from EMR and ED providers. He was recently hospitalized from 6/11 to 6/16 after multiple episodes of seizure.  He was treated for pneumonia as well as ESBL E. coli UTI. He was intubated. He was found to have a fever to 100.6.  Normotensive.  O2 sat improved to 100%.  He received vancomycin and cefepime, 1 L of normal saline.  Past Medical History  dementia, reported COPD, seizure disorder, hypertension, PVD, CVA, aspiration, recurrent UTI  Significant Hospital Events   8/27>Intubated 8/28>Admit  Consults:  None  Procedures:  8/27> ETT  Significant Diagnostic Tests:  CXR 8/27> Right greater than left lower lung airspace disease. No pleural effusion.  Micro Data:  Resp Cx 8/28> Urine Cx 8/27> BCx 8/27> COVID19 8/27> Neg  Antimicrobials:  Vanc 8/27> Cefepime 8/27>8/27 Zosyn 8/28> Anti-infectives (From admission, onward)   Start     Dose/Rate Route Frequency Ordered Stop   09/05/19 1000  vancomycin (VANCOREADY) IVPB 750 mg/150 mL        750 mg 150 mL/hr over 60 Minutes Intravenous Every 12 hours 09/05/19 0216     09/05/19 0600  piperacillin-tazobactam (ZOSYN) IVPB 3.375 g        3.375 g 12.5 mL/hr over 240 Minutes Intravenous Every 8 hours 09/05/19 0216     09/04/19 2115  vancomycin (VANCOCIN) IVPB 1000 mg/200 mL premix        1,000 mg 200 mL/hr over 60 Minutes Intravenous  Once  09/04/19 2114 09/04/19 2321   09/04/19 2115  ceFEPIme (MAXIPIME) 2 g in sodium chloride 0.9 % 100 mL IVPB        2 g 200 mL/hr over 30 Minutes Intravenous  Once 09/04/19 2114 09/04/19 2219        Interim history/subjective:    09/05/2019 - re-rounds for the same 24h clock - seen in ER bed 13. Being moved to ICU.  On vent, On diprivan. Has PIV x 3.  Has contractures.   Objective   Blood pressure (!) 157/82, pulse 79, temperature (!) 100.7 F (38.2 C), resp. rate 20, height 5\' 11"  (1.803 m), weight 61.5 kg, SpO2 100 %.    Vent Mode: PRVC FiO2 (%):  [40 %-100 %] 40 % Set Rate:  [12 bmp-20 bmp] 20 bmp Vt Set:  [600 mL] 600 mL PEEP:  [5 cmH20-8 cmH20] 5 cmH20 Plateau Pressure:  [18 cmH20-19 cmH20] 18 cmH20   Intake/Output Summary (Last 24 hours) at 09/05/2019 1613 Last data filed at 09/05/2019 09/07/2019 Gross per 24 hour  Intake --  Output 50 ml  Net -50 ml   Filed Weights   09/04/19 2338 09/04/19 2340  Weight: 61 kg 61.5 kg    Examination: General Appearance:  Looks criticall ill, emaciated , and chronic contracyures on vent Head:  Normocephalic, without obvious abnormality, atraumatic Eyes:  PERRL - , conjunctiva/corneas - muddy     Ears:  Normal external ear canals, both ears Nose:  G tube - ?  Throat:  ETT TUBE - yes , OG tube - ? Neck:  Supple,  No enlargement/tenderness/nodules Lungs: Clear to auscultation bilaterally, Ventilator   Synchrony - yes Heart:  S1 and S2 normal, no murmur, CVP - x.  Pressors - no Abdomen:  Soft, no masses, no organomegaly Genitalia / Rectal:  Diapers Extremities:  Extremities- intact but has contractures Skin:  ntact in exposed areas . Sacral area - not examined Neurologic:  Sedation - diprivan gtt -> RASS - -4      Assessment & Plan:  68 year old man with history of dementia, reported COPD, seizure disorder, hypertension, PVD, CVA, aspiration, recurrent UTI presenting from [facility] with dyspnea found to have acute hypoxic respiratory  failure now intubated.  Acute hypoxic and hypercarbic respiratory failure:   09/05/2019  PM - sedated on diprivan and vent dependent  PLAn --Continue vent support. SAT/SBT in AM if able --Wean FiO2 and PEEP for O2 sat goal >88%  Acute encephalopathy, sepsis due to aspiration pneumonia and probable UTI:  Seizure disorder: --No reported seizure activity.     09/05/2019  - PM - RASS --5  On diprivan.  Follow up carbamazepine level ok, Valproic acid level low .  Plan  - If mental status is not improved in AM, consider EEG. - dc home klonopin (too drowsy in PM on diprivan) - --Continue home  carbamazepine, and Zantac, Keppra  Concern for sepsis/ ---Got vanc/cefepime in ED.   Plan  - Change to vanc/Zosyn due to sensitivities of recent E coli cultured from urine. --Follow up blood, urine, and resp cultures   Lactic acidosis:  - Resolving after 1L NS  09/05/2019 - improving in AM  Plan  - track lactate  Mild transamininitis: likely from above.  Plan  -  Recheck LFTs in AM.  HTN:  --Continue amlodipine 5mg  daily --Hold home hydralazine and Imdur for now.  Consider restarting in a.m.  Reported COPD: --Continue albuterol prn + add duopnb  Electrolyte imbalance  09/05/2019 - low mag < 2  Plan  - replete Mag  Best practice:  Diet: NPO, consider starting tube feeds in a.m. Pain/Anxiety/Delirium protocol (if indicated): Fentanyl as needed, propofol gtt. VAP protocol (if indicated): Ordered DVT prophylaxis: Subq Lovenox GI prophylaxis: PPI Glucose control: Monitor Mobility: PT when able Code Status: Per ED documentation, DNR but wife agreeable to intubation as needed Family Communication: No family at bedside Disposition: ICU     ATTESTATION & SIGNATURE   The patient Lee [name] is critically ill with multiple organ systems failure and requires high complexity decision making for assessment and support, frequent evaluation and titration of therapies,  application of advanced monitoring technologies and extensive interpretation of multiple databases.   Critical Care Time devoted to patient care services described in this note is  35  Minutes. This time reflects time of care of this signee Dr Tessie Eke. This critical care time does not reflect procedure time, or teaching time or supervisory time of PA/NP/Med student/Med Resident etc but could involve care discussion time     Dr. Kalman Shan, M.D., [facility]C.P Pulmonary and Critical Care Medicine Staff Physician [facility] Pager: [phone], If no answer or between  15:00h - 7:00h: call [phone]  09/05/2019 4:13 PM    LABS    PULMONARY Recent Labs  Lab 09/04/19 2141 09/05/19 0118 09/05/19 0500  PHART 7.334* 7.394 7.423  PCO2ART 54.0* 43.4 40.8  PO2ART 204* 179* 115*  HCO3 27.9 25.7 26.2  O2SAT 99.5 99.6 98.6    CBC Recent Labs  Lab 09/04/19 2141 09/05/19 0637  HGB 16.7 13.3  HCT 52.7* 40.2  WBC 8.8 10.0  PLT 328 251    COAGULATION Recent Labs  Lab 09/04/19 2141  INR 1.1    CARDIAC  No results for input(s): TROPONINI in the last 168 hours. No results for input(s): PROBNP in the last 168 hours.   CHEMISTRY Recent Labs  Lab 09/04/19 2141 09/05/19 0637  NA 139 138  K 3.9 3.9  CL 101 106  CO2 25 23  GLUCOSE 121* 96  BUN 12 12  CREATININE 0.63 0.48*  CALCIUM 8.9 8.5*  MG  --  1.8  PHOS  --  2.5   Estimated Creatinine Clearance: 74.7 mL/min (A) (by C-G formula based on SCr of 0.48 mg/dL (L)).   LIVER Recent Labs  Lab 09/04/19 2141 09/05/19 0637  AST 44* 32  ALT 31 25  ALKPHOS 123 94  BILITOT 0.2* 0.4  PROT 8.3* 6.5  ALBUMIN 3.3* 2.6*  INR 1.1  --      INFECTIOUS Recent Labs  Lab 09/04/19 2141 09/05/19 0035  LATICACIDVEN 3.7* 2.4*     ENDOCRINE CBG (last 3)  No results for input(s): GLUCAP in the last 72 hours.       IMAGING x48h  - image(s) personally  visualized  -   highlighted in bold DG Abdomen 1 View  Result Date: 09/05/2019 CLINICAL DATA:  OG tube placement EXAM: ABDOMEN - 1 VIEW COMPARISON:  02/23/2019, 09/04/2019 FINDINGS: Esophageal tube tip overlies the proximal to mid stomach. Side port slightly beyond GE junction. Airspace disease at both lung bases. IMPRESSION: Esophageal tube tip overlies the proximal to mid stomach. Electronically Signed   By: Jasmine Pang M.D.   On: 09/05/2019 03:27   DG Chest Portable 1 View  Result Date: 09/05/2019 CLINICAL DATA:  Intubated EXAM: PORTABLE CHEST 1 VIEW COMPARISON:  09/04/2019, CT 05/14/2019 FINDINGS: Interval intubation, tip of the endotracheal tube is about 2 cm superior to the carina. Right greater than left lower lung airspace disease. No pleural effusion. Stable cardiomediastinal silhouette with aortic atherosclerosis. No pneumothorax IMPRESSION: 1. Endotracheal tube tip about 2 cm superior to the carina. 2. Right greater than left lower lung airspace disease suspicious for pneumonia and or aspiration. Electronically Signed   By: Jasmine Pang M.D.   On: 09/05/2019 00:01   DG Chest Port 1 View  Result Date: 09/04/2019 CLINICAL DATA:  Shortness of breath EXAM: PORTABLE CHEST 1 VIEW COMPARISON:  06/19/2019 FINDINGS: Cardiac shadow is stable. Increasing right basilar infiltrate is noted likely related aspiration given the patient's clinical history. Mild central vascular congestion is seen. No bony abnormality is noted. IMPRESSION: Increasing right basilar infiltrative density likely related aspiration given the patient's clinical history. Electronically Signed   By: Alcide Clever M.D.   On: 09/04/2019 21:30

## 2019-09-05 NOTE — Progress Notes (Signed)
Abg obtained on the following vent settings: PRVC mode, VT , rr20, 50%, +5peep.  Results for Lee [name], Lee [name] (MRN [id number]) as of 09/05/2019 06:26  Ref. Range 09/05/2019 05:00  Delivery systems Unknown VENTILATOR  FIO2 Unknown 50.00  Mode Unknown PRESSURE REGULATED VOLUME CONTROL  VT Latest Units: mL 600  Peep/cpap Latest Units: cm H20 5.0  pH, Arterial Latest Ref Range: 7.35 - 7.45  7.423  pCO2 arterial Latest Ref Range: 32 - 48 mmHg 40.8  pO2, Arterial Latest Ref Range: 83 - 108 mmHg 115 (H)  Acid-Base Excess Latest Ref Range: 0.0 - 2.0 mmol/L 2.1 (H)  Bicarbonate Latest Ref Range: 20.0 - 28.0 mmol/L 26.2  O2 Saturation Latest Units: % 98.6  Patient temperature Unknown 98.4  Collection site Unknown RIGHT RADIAL  Allens test (pass/fail) Latest Ref Range: PASS  PASS

## 2019-09-05 NOTE — Progress Notes (Signed)
Sputum specimen obtained and sent to lab.  RN aware.
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
## 2019-09-05 NOTE — Progress Notes (Signed)
Abg obtained on the following vent settings: PRVC mode, VT 600 (57ml/kg), rr20, 70%,+5peep.  Results for Lee [name], Lee [name] (MRN [id number]) as of 09/05/2019 01:37  Ref. Range 09/05/2019 01:18  Delivery systems Unknown VENTILATOR  FIO2 Unknown 70.00  Mode Unknown PRESSURE REGULATED VOLUME CONTROL  VT Latest Units: mL 600  Peep/cpap Latest Units: cm H20 5.0  pH, Arterial Latest Ref Range: 7.35 - 7.45  7.394  pCO2 arterial Latest Ref Range: 32 - 48 mmHg 43.4  pO2, Arterial Latest Ref Range: 83 - 108 mmHg 179 (H)  Acid-Base Excess Latest Ref Range: 0.0 - 2.0 mmol/L 1.3  Bicarbonate Latest Ref Range: 20.0 - 28.0 mmol/L 25.7  O2 Saturation Latest Units: % 99.6  Patient temperature Unknown 99.9  Collection site Unknown RIGHT RADIAL  Allens test (pass/fail) Latest Ref Range: PASS  PASS

## 2019-09-05 NOTE — ED Notes (Signed)
I have just phoned report to [facility], RN in ICU. I will transport shortly.

## 2019-09-05 NOTE — ED Notes (Signed)
Current vent settings: O2 40%; rate 20; PEEP of 5, Vt 600. He is resting quietly and does move his head occasionally.

## 2019-09-06 DIAGNOSIS — J9602 Acute respiratory failure with hypercapnia: Secondary | ICD-10-CM

## 2019-09-06 DIAGNOSIS — J9601 Acute respiratory failure with hypoxia: Secondary | ICD-10-CM

## 2019-09-06 LAB — CBC
HCT: 38.6 % — ABNORMAL LOW (ref 39.0–52.0)
Hemoglobin: 12.7 g/dL — ABNORMAL LOW (ref 13.0–17.0)
MCH: 32.1 pg (ref 26.0–34.0)
MCHC: 32.9 g/dL (ref 30.0–36.0)
MCV: 97.5 fL (ref 80.0–100.0)
Platelets: 255 10*3/uL (ref 150–400)
RBC: 3.96 MIL/uL — ABNORMAL LOW (ref 4.22–5.81)
RDW: 15.9 % — ABNORMAL HIGH (ref 11.5–15.5)
WBC: 9 10*3/uL (ref 4.0–10.5)
nRBC: 0 % (ref 0.0–0.2)

## 2019-09-06 LAB — COMPREHENSIVE METABOLIC PANEL
ALT: 20 U/L (ref 0–44)
AST: 22 U/L (ref 15–41)
Albumin: 2.5 g/dL — ABNORMAL LOW (ref 3.5–5.0)
Alkaline Phosphatase: 91 U/L (ref 38–126)
Anion gap: 8 (ref 5–15)
BUN: 13 mg/dL (ref 8–23)
CO2: 25 mmol/L (ref 22–32)
Calcium: 8.3 mg/dL — ABNORMAL LOW (ref 8.9–10.3)
Chloride: 105 mmol/L (ref 98–111)
Creatinine, Ser: 0.49 mg/dL — ABNORMAL LOW (ref 0.61–1.24)
GFR calc Af Amer: >60 mL/min (ref >60)
GFR calc non Af Amer: >60 mL/min (ref >60)
Glucose, Bld: 125 mg/dL — ABNORMAL HIGH (ref 70–99)
Potassium: 3.4 mmol/L — ABNORMAL LOW (ref 3.5–5.1)
Sodium: 138 mmol/L (ref 135–145)
Total Bilirubin: 0.3 mg/dL (ref 0.3–1.2)
Total Protein: 6.3 g/dL — ABNORMAL LOW (ref 6.5–8.1)

## 2019-09-06 LAB — MAGNESIUM: Magnesium: 1.9 mg/dL (ref 1.7–2.4)

## 2019-09-06 LAB — GLUCOSE, CAPILLARY
Glucose-Capillary: 101 mg/dL — ABNORMAL HIGH (ref 70–99)
Glucose-Capillary: 108 mg/dL — ABNORMAL HIGH (ref 70–99)
Glucose-Capillary: 121 mg/dL — ABNORMAL HIGH (ref 70–99)
Glucose-Capillary: 123 mg/dL — ABNORMAL HIGH (ref 70–99)
Glucose-Capillary: 130 mg/dL — ABNORMAL HIGH (ref 70–99)
Glucose-Capillary: 133 mg/dL — ABNORMAL HIGH (ref 70–99)
Glucose-Capillary: 140 mg/dL — ABNORMAL HIGH (ref 70–99)

## 2019-09-06 LAB — PHOSPHORUS: Phosphorus: 2.7 mg/dL (ref 2.5–4.6)

## 2019-09-06 MED ORDER — MAGNESIUM SULFATE 2 GM/50ML IV SOLN
2.0000 g | Freq: Once | INTRAVENOUS | Status: AC
  Administered 2019-09-06: 2 g via INTRAVENOUS
  Filled 2019-09-06: qty 50

## 2019-09-06 MED ORDER — POTASSIUM CHLORIDE 20 MEQ/15ML (10%) PO SOLN
40.0000 meq | Freq: Once | ORAL | Status: AC
  Administered 2019-09-06: 40 meq
  Filled 2019-09-06: qty 30

## 2019-09-06 MED ORDER — FREE WATER
200.0000 mL | Freq: Four times a day (QID) | Status: DC
  Administered 2019-09-06 – 2019-09-09 (×11): 200 mL

## 2019-09-06 NOTE — Progress Notes (Signed)
NAME:  Lee [name], MRN:  [id number], DOB:  1950-11-03, LOS: 1 ADMISSION DATE:  09/04/2019, CONSULTATION DATE:  09/05/2019 REFERRING MD:  EDP, CHIEF COMPLAINT: Respiratory failure  Brief History   69 year old man with history of dementia, reported COPD, seizure disorder, hypertension, PVD, CVA, aspiration, recurrent UTI presenting from [facility] with dyspnea found to have acute hypoxic respiratory failure.  At his facility he was found to be 75% on room air.  EMS placed him on nonrebreather.  Unable to obtain history from patient due to intubation/acute encephalopathy.  History obtained from EMR and ED providers. He was recently hospitalized from 6/11 to 6/16 after multiple episodes of seizure.  He was treated for pneumonia as well as ESBL E. coli UTI. He was intubated. He was found to have a fever to 100.6.  Normotensive.  O2 sat improved to 100%.  He received vancomycin and cefepime, 1 L of normal saline.  Past Medical History  dementia, reported COPD, seizure disorder, hypertension, PVD, CVA, aspiration, recurrent UTI  Significant Hospital Events   8/27>Intubated 8/28>Admit  Consults:  None  Procedures:  8/27> ETT  Significant Diagnostic Tests:  CXR 8/27> Right greater than left lower lung airspace disease. No pleural effusion.  Micro Data:  Resp Cx 8/28> Urine Cx 8/27> BCx 8/27> COVID19 8/27> Neg  Antimicrobials:  Vanc 8/27> Cefepime 8/27>8/27 Zosyn 8/28>  Interim history/subjective:  Lying in bed in no acute distress, sequentials at attempt for pupillary exam and shakes head when asked to perform commands  Objective   Blood pressure (!) 108/44, pulse 87, temperature 99.3 F (37.4 C), temperature source Axillary, resp. rate (!) 23, height 5\' 11"  (1.803 m), weight 51.6 kg, SpO2 98 %.    Vent Mode: PSV;CPAP FiO2 (%):  [40 %] 40 % Set Rate:  [20 bmp] 20 bmp Vt Set:  [600 mL] 600 mL PEEP:  [5 cmH20] 5 cmH20 Pressure Support:  [8 cmH20] 8 cmH20 Plateau  Pressure:  [17 cmH20-19 cmH20] 17 cmH20   Intake/Output Summary (Last 24 hours) at 09/06/2019 1615 Last data filed at 09/06/2019 1120 Gross per 24 hour  Intake 1189 ml  Output 100 ml  Net 1089 ml   Filed Weights   09/04/19 2338 09/04/19 2340 09/05/19 1631  Weight: 61 kg 61.5 kg 51.6 kg    Examination: General: Chronic ill-appearing cachectic frail elderly gentleman lying in bed on mechanical ventilation in no acute distress HEENT: ETT, MM pink/moist, PERRL,  Neuro: Unable to follow any commands but appears to be alert on small propofol drip for ventilation. CV: s1s2 regular rate and rhythm, no murmur, rubs, or gallops,  PULM: Clear to auscultation bilaterally, slightly decreased breath sounds right lower base, tolerating pressure support ventilation well, no increased work of breathing GI: soft, bowel sounds active in all 4 quadrants, non-tender, non-distended, Extremities: warm/dry, no edema  Skin: no rashes or lesions  Resolved problems:  Mild transamininitis  Assessment & Plan:  69 year old man with history of dementia, reported COPD, seizure disorder, hypertension, PVD, CVA, aspiration, recurrent UTI presenting from [facility] with dyspnea found to have acute hypoxic respiratory failure now intubated.  Acute hypoxic and hypercarbic respiratory failure:  -Concern for aspiration event Reported history of COPD P: Remains on pressure support ventilation and tolerating well Appears that mentation is near baseline Will discuss with attending physician but believe patient will be able to be successfully extubated Wean PEEP and FiO2 for sats greater than 90%. Head of bed elevated 30 degrees. Plateau pressures less than 30  cm H20.  Follow intermittent chest x-ray and ABG.   Ensure adequate pulmonary hygiene  Follow cultures  VAP bundle in place  PAD protocol Continue as needed bronchodilators  Acute encephalopathy -In the setting of acute infection and need for sedation  given ventilation P: Minimize sedation as able Delirium precautions Supportive care  Severe sepsis due to aspiration pneumonia and probable UTI -Patient presented with tachypnea, tachycardia, febrile, with acute concern for infection leading to criteria for sepsis.  Patient additionally meets criteria for severe sepsis given positive lactic acid and altered mental status on admission. Lactic acidosis -Lactic trended 3.7 .  2.4 P: Remains in ICU Continue ventilator support as above Follow panculture Continue IV vancomycin and Zosyn Received 3 L IV fluid on admission in ED Trend lactic acid Monitor urine output Repeat lactic   Seizure disorder: -Home medications include Tegretol Vimpat and valproic acid P: Continue home antiepileptic regiment Hold home Klonopin No clinical signs of seizures currently Seizure precautions  HTN:  -Home medications include Catapres, hydralazine, and Norvasc P: Blood pressure currently well controlled Hold blood pressure medication remains on hold Monitor closely in the ICU  Hypokalemia P: Supplement Trend Bmet   Best practice:  Diet: NPO, consider starting tube feeds in a.m. Pain/Anxiety/Delirium protocol (if indicated): Fentanyl as needed, propofol gtt. VAP protocol (if indicated): Ordered DVT prophylaxis: Subq Lovenox GI prophylaxis: PPI Glucose control: Monitor Mobility: PT when able Code Status: Per ED documentation, DNR but wife agreeable to intubation as needed Family Communication: No family at bedside Disposition: ICU   LABS    PULMONARY Recent Labs  Lab 09/04/19 2141 09/05/19 0118 09/05/19 0500  PHART 7.334* 7.394 7.423  PCO2ART 54.0* 43.4 40.8  PO2ART 204* 179* 115*  HCO3 27.9 25.7 26.2  O2SAT 99.5 99.6 98.6    CBC Recent Labs  Lab 09/04/19 2141 09/05/19 0637 09/06/19 0741  HGB 16.7 13.3 12.7*  HCT 52.7* 40.2 38.6*  WBC 8.8 10.0 9.0  PLT 328 251 255    COAGULATION Recent Labs  Lab 09/04/19 2141   INR 1.1    CARDIAC  No results for input(s): TROPONINI in the last 168 hours. No results for input(s): PROBNP in the last 168 hours.   CHEMISTRY Recent Labs  Lab 09/04/19 2141 09/04/19 2141 09/05/19 0637 09/06/19 0741  NA 139  --  138 138  K 3.9   < > 3.9 3.4*  CL 101  --  106 105  CO2 25  --  23 25  GLUCOSE 121*  --  96 125*  BUN 12  --  12 13  CREATININE 0.63  --  0.48* 0.49*  CALCIUM 8.9  --  8.5* 8.3*  MG  --   --  1.8 1.9  PHOS  --   --  2.5 2.7   < > = values in this interval not displayed.   Estimated Creatinine Clearance: 62.7 mL/min (A) (by C-G formula based on SCr of 0.49 mg/dL (L)).   LIVER Recent Labs  Lab 09/04/19 2141 09/05/19 0637 09/06/19 0741  AST 44* 32 22  ALT 31 25 20   ALKPHOS 123 94 91  BILITOT 0.2* 0.4 0.3  PROT 8.3* 6.5 6.3*  ALBUMIN 3.3* 2.6* 2.5*  INR 1.1  --   --      INFECTIOUS Recent Labs  Lab 09/04/19 2141 09/05/19 0035  LATICACIDVEN 3.7* 2.4*     ENDOCRINE CBG (last 3)  Recent Labs    09/06/19 0835 09/06/19 1237 09/06/19 1553  GLUCAP 121*  140* 133*    IMAGING x48h  - image(s) personally visualized  -   highlighted in bold DG Abdomen 1 View  Result Date: 09/05/2019 CLINICAL DATA:  OG tube placement EXAM: ABDOMEN - 1 VIEW COMPARISON:  02/23/2019, 09/04/2019 FINDINGS: Esophageal tube tip overlies the proximal to mid stomach. Side port slightly beyond GE junction. Airspace disease at both lung bases. IMPRESSION: Esophageal tube tip overlies the proximal to mid stomach. Electronically Signed   By: Jasmine Pang M.D.   On: 09/05/2019 03:27   DG Chest Portable 1 View  Result Date: 09/05/2019 CLINICAL DATA:  Intubated EXAM: PORTABLE CHEST 1 VIEW COMPARISON:  09/04/2019, CT 05/14/2019 FINDINGS: Interval intubation, tip of the endotracheal tube is about 2 cm superior to the carina. Right greater than left lower lung airspace disease. No pleural effusion. Stable cardiomediastinal silhouette with aortic atherosclerosis. No  pneumothorax IMPRESSION: 1. Endotracheal tube tip about 2 cm superior to the carina. 2. Right greater than left lower lung airspace disease suspicious for pneumonia and or aspiration. Electronically Signed   By: Jasmine Pang M.D.   On: 09/05/2019 00:01   DG Chest Port 1 View  Result Date: 09/04/2019 CLINICAL DATA:  Shortness of breath EXAM: PORTABLE CHEST 1 VIEW COMPARISON:  06/19/2019 FINDINGS: Cardiac shadow is stable. Increasing right basilar infiltrate is noted likely related aspiration given the patient's clinical history. Mild central vascular congestion is seen. No bony abnormality is noted. IMPRESSION: Increasing right basilar infiltrative density likely related aspiration given the patient's clinical history. Electronically Signed   By: Alcide Clever M.D.   On: 09/04/2019 21:30    Critical care:   Performed by: Delfin Gant   Total critical care time: 40 minutes  Critical care time was exclusive of separately billable procedures and treating other patients.  Critical care was necessary to treat or prevent imminent or life-threatening deterioration.  Critical care was time spent personally by me on the following activities: development of treatment plan with patient and/or surrogate as well as nursing, discussions with consultants, evaluation of patient's response to treatment, examination of patient, obtaining history from patient or surrogate, ordering and performing treatments and interventions, ordering and review of laboratory studies, ordering and review of radiographic studies, pulse oximetry and re-evaluation of patient's condition.  Delfin Gant, NP-C [facility] Contact / Pager information can be found on Amion  09/06/2019, 4:28 PM

## 2019-09-07 ENCOUNTER — Inpatient Hospital Stay (HOSPITAL_COMMUNITY)
Admit: 2019-09-07 | Discharge: 2019-09-07 | Disposition: A | Discharge status: Home / Self Care | Payer: Medicare Other | Attending: Acute Care | Admitting: Acute Care

## 2019-09-07 ENCOUNTER — Inpatient Hospital Stay (HOSPITAL_COMMUNITY): Payer: Medicare Other

## 2019-09-07 DIAGNOSIS — J690 Pneumonitis due to inhalation of food and vomit: Secondary | ICD-10-CM

## 2019-09-07 DIAGNOSIS — J9621 Acute and chronic respiratory failure with hypoxia: Secondary | ICD-10-CM

## 2019-09-07 DIAGNOSIS — R569 Unspecified convulsions: Secondary | ICD-10-CM

## 2019-09-07 LAB — GLUCOSE, CAPILLARY
Glucose-Capillary: 125 mg/dL — ABNORMAL HIGH (ref 70–99)
Glucose-Capillary: 122 mg/dL — ABNORMAL HIGH (ref 70–99)
Glucose-Capillary: 122 mg/dL — ABNORMAL HIGH (ref 70–99)
Glucose-Capillary: 126 mg/dL — ABNORMAL HIGH (ref 70–99)
Glucose-Capillary: 150 mg/dL — ABNORMAL HIGH (ref 70–99)
Glucose-Capillary: 127 mg/dL — ABNORMAL HIGH (ref 70–99)
Glucose-Capillary: 139 mg/dL — ABNORMAL HIGH (ref 70–99)

## 2019-09-07 LAB — CBC
HCT: 36.8 % — ABNORMAL LOW (ref 39.0–52.0)
Hemoglobin: 12.1 g/dL — ABNORMAL LOW (ref 13.0–17.0)
MCH: 31.9 pg (ref 26.0–34.0)
MCHC: 32.9 g/dL (ref 30.0–36.0)
MCV: 97.1 fL (ref 80.0–100.0)
Platelets: 277 10*3/uL (ref 150–400)
RBC: 3.79 MIL/uL — ABNORMAL LOW (ref 4.22–5.81)
RDW: 15.9 % — ABNORMAL HIGH (ref 11.5–15.5)
WBC: 9.8 10*3/uL (ref 4.0–10.5)
nRBC: 0 % (ref 0.0–0.2)

## 2019-09-07 LAB — URINE CULTURE: Culture: >=100000 — AB

## 2019-09-07 LAB — BASIC METABOLIC PANEL
Anion gap: 8 (ref 5–15)
BUN: 11 mg/dL (ref 8–23)
CO2: 26 mmol/L (ref 22–32)
Calcium: 8.1 mg/dL — ABNORMAL LOW (ref 8.9–10.3)
Chloride: 106 mmol/L (ref 98–111)
Creatinine, Ser: 0.43 mg/dL — ABNORMAL LOW (ref 0.61–1.24)
GFR calc Af Amer: >60 mL/min (ref >60)
GFR calc non Af Amer: >60 mL/min (ref >60)
Glucose, Bld: 138 mg/dL — ABNORMAL HIGH (ref 70–99)
Potassium: 3.6 mmol/L (ref 3.5–5.1)
Sodium: 140 mmol/L (ref 135–145)

## 2019-09-07 LAB — TROPONIN I (HIGH SENSITIVITY): Troponin I (High Sensitivity): 41 ng/L — ABNORMAL HIGH (ref <18)

## 2019-09-07 LAB — CULTURE, RESPIRATORY W GRAM STAIN: Culture: NORMAL

## 2019-09-07 LAB — PHOSPHORUS: Phosphorus: 1.9 mg/dL — ABNORMAL LOW (ref 2.5–4.6)

## 2019-09-07 LAB — PROCALCITONIN: Procalcitonin: 3.14 ng/mL

## 2019-09-07 MED ORDER — SODIUM CHLORIDE 0.9 % IV SOLN
1.0000 g | Freq: Three times a day (TID) | INTRAVENOUS | Status: DC
  Administered 2019-09-07 – 2019-09-12 (×15): 1 g via INTRAVENOUS
  Filled 2019-09-07 (×19): qty 1

## 2019-09-07 MED ORDER — COLLAGENASE 250 UNIT/GM EX OINT
TOPICAL_OINTMENT | Freq: Every day | CUTANEOUS | Status: DC
  Administered 2019-09-07: 1 via TOPICAL
  Filled 2019-09-07: qty 30

## 2019-09-07 MED ORDER — VITAL 1.5 CAL PO LIQD
1000.0000 mL | ORAL | Status: AC
  Administered 2019-09-07: 1000 mL
  Filled 2019-09-07 (×2): qty 1000

## 2019-09-07 MED ORDER — POTASSIUM CHLORIDE 20 MEQ/15ML (10%) PO SOLN
40.0000 meq | Freq: Once | ORAL | Status: AC
  Administered 2019-09-07: 40 meq
  Filled 2019-09-07: qty 30

## 2019-09-07 MED ORDER — FUROSEMIDE 10 MG/ML IJ SOLN
40.0000 mg | Freq: Once | INTRAMUSCULAR | Status: AC
  Administered 2019-09-07: 40 mg via INTRAVENOUS
  Filled 2019-09-07: qty 4

## 2019-09-07 MED ORDER — HYDRALAZINE HCL 50 MG PO TABS
50.0000 mg | ORAL_TABLET | Freq: Three times a day (TID) | ORAL | Status: DC
  Administered 2019-09-07 – 2019-09-08 (×5): 50 mg
  Filled 2019-09-07 (×6): qty 1

## 2019-09-07 MED ORDER — CLONAZEPAM 0.5 MG PO TABS
0.2500 mg | ORAL_TABLET | Freq: Two times a day (BID) | ORAL | Status: DC
  Administered 2019-09-07 – 2019-09-11 (×9): 0.25 mg
  Filled 2019-09-07 (×9): qty 1

## 2019-09-07 MED ORDER — VALPROIC ACID 250 MG/5ML PO SOLN
750.0000 mg | Freq: Three times a day (TID) | ORAL | Status: DC
  Administered 2019-09-07 – 2019-09-15 (×23): 750 mg
  Filled 2019-09-07 (×26): qty 15

## 2019-09-07 NOTE — Consult Note (Signed)
WOC Nurse Consult Note: Reason for Consult: Consult requested for sacrum and BLE.  Pt is very emeciated with multiple systemic factors which can impair healing. Wound type: Sacrum with healing stage 3 pressure injury; dark red and moist, small amt pink drainage, no odor; 3X2.5cm Lower buttock with partial thickness shear injury above rectum; .5X.5X.1cm, pink and moist Bilat heels with healing patchy areas of pink dry scar tissue and patchy areas of stage 2 pressure injuries, pink and dry; largest is 2X2X.1cm Left outer ankle with an unstageable pressure injury; 3X3cm, 50% red, 50% yellow slough tightly adhered, small amt tan drainage.  Pressure Injury POA: Yes Dressing procedure/placement/frequency: Pt is on a low airloss mattress to reduce pressure. Topical treatment orders provided for bedside nurses to perform as follows: Apply Santyl to left outer ankle Q day, then cover with moist 2X2 and foam dressing.  (Change foam dressing Q 3 days or PRN soiling.) Foam dressing to buttocks/sacrum, bilat heels.  Change Q 3 days or PRN soiling.  Float heels to reduce pressure. Please re-consult if further assistance is needed.  Thank-you,  Cammie Mcgee MSN, RN, CWOCN, [place], CNS [phone]

## 2019-09-07 NOTE — Progress Notes (Signed)
NAME:  Lee [name], MRN:  [id number], DOB:  November 11, 1950, LOS: 2 ADMISSION DATE:  09/04/2019, CONSULTATION DATE:  09/05/2019 REFERRING MD:  EDP, CHIEF COMPLAINT: Respiratory failure  Brief History   69 year old man with history of dementia, reported COPD, seizure disorder, hypertension, PVD, CVA, aspiration, recurrent UTI presenting from [facility] with dyspnea found to have acute hypoxic respiratory failure requiring intubation.  At his facility he was found to be 75% on room air.   [name] He was recently hospitalized from 6/11 to 6/16 after multiple episodes of seizure.  He was treated for pneumonia as well as ESBL E. coli UTI    Past Medical History  dementia, reported COPD, seizure disorder, hypertension, PVD, CVA, aspiration, recurrent UTI  Significant Hospital Events   8/27>Intubated 8/28>Admit 8/30 placed on spontaneous breathing trial, F/VT acceptable however work of breathing noted to be increased.  ESBL producing E. coli noted in urine, changed to meropenem hemodynamically stable however still minimally responsive Consults:  None  Procedures:  8/27> ETT  Significant Diagnostic Tests:  CXR 8/27> Right greater than left lower lung airspace disease. No pleural effusion.  Micro Data:  Resp Cx 8/28: rare gpc pairs Urine Cx 8/27: >100K Ecoli (ESBL producer) BCx 8/27> COVID19 8/27> Neg  Antimicrobials:  Vanc 8/27> Cefepime 8/27>8/27 Zosyn 8/28>8/30 Meropenem (ESBL producer ecoli) 8/30>>> Interim history/subjective:  Slightly tachypneic on pressure support ventilation  Objective   Blood pressure (Abnormal) 143/66, pulse 93, temperature (Abnormal) 100.5 F (38.1 C), temperature source Axillary, resp. rate (Abnormal) 22, height 5\' 11"  (1.803 m), weight 51.6 kg, SpO2 98 %.    Vent Mode: PRVC FiO2 (%):  [40 %] 40 % Set Rate:  [20 bmp] 20 bmp Vt Set:  [600 mL] 600 mL PEEP:  [5 cmH20] 5 cmH20 Pressure Support:  [8 cmH20] 8 cmH20 Plateau Pressure:  [16 cmH20-17 cmH20]  16 cmH20   Intake/Output Summary (Last 24 hours) at 09/07/2019 0816 Last data filed at 09/07/2019 09/09/2019 Gross per 24 hour  Intake 408.84 ml  Output 1300 ml  Net -891.16 ml   Filed Weights   09/04/19 2338 09/04/19 2340 09/05/19 1631  Weight: 61 kg 61.5 kg 51.6 kg    Examination:  General this is a frail 69 year old black male currently on pressure support ventilation he will follow commands, as far as squeezing hands.  Otherwise not really interactive  HEENT temporal wasting mucous membranes dry orally intubated neck veins flat Pulmonary: Diminished throughout, mildly tachypneic respiratory rate in the mid 30s some accessory use and paradoxical efforts on pressure support of 5/PEEP 5 however tidal volumes are in the 400 range with this Cardiac regular rate and rhythm Abdomen soft nontender Extremities are contracted, no edema, pulses palpable Neuro will squeeze hands to command, I cannot get him to open eyes.  His upper and lower extremities are contracted Resolved problems:  Mild transamininitis  Assessment & Plan:  69 year old man with history of dementia, reported COPD, seizure disorder, hypertension, PVD, CVA, aspiration, recurrent UTI presenting from [facility] with dyspnea found to have acute hypoxic respiratory failure now intubated.  Acute hypoxic and hypercarbic respiratory failure:  2/2 aspiration PNA  Reported history of COPD Portable chest x-ray personally reviewed his film is rotated making exam difficult ongoing bilateral airspace disease, right sided airspace disease somewhat improved left basilar airspace disease about the same Tolerating pressure support ventilation however mental status and work of breathing primary barriers to extubation as of 8/30 Plan Continue pressure support ventilation PAD protocol, RASS goal 0  IV diuresis x1 Continuing current antibiotic regimen, have changed him to meropenem, day #1 Awaiting pending respiratory cultures He is going to  need swallowing evaluation post extubation  Acute encephalopathy superimposed on advanced dementia, further complicated by seizure disorder Plan Continuing Tegretol Vimpat and valproic acid, adjusting valproic acid up I am going to get an EEG, given his ongoing encephalopathy Need to get back on at least low level clonazepam given risk of  withdrawal Continue seizure precautions  ESBL  UTI -Patient presented with tachypnea, tachycardia, febrile, with acute concern for infection leading to criteria for sepsis.  Patient additionally meets criteria for severe sepsis given positive lactic acid and altered mental status on admission. Plan Meropenem day 1   HTN:  -Home medications include Catapres, hydralazine, and Norvasc Plan Treat as needed, may need to resume home medicines at some point, starting off with diuresis  Intermittent fluid and electrolyte imbalance Plan Monitor, replace as needed  Best practice:  Diet: NPO, consider starting tube feeds in a.m. Pain/Anxiety/Delirium protocol (if indicated): Fentanyl as needed, propofol gtt. VAP protocol (if indicated): Ordered DVT prophylaxis: Subq Lovenox GI prophylaxis: PPI Glucose control: Monitor Mobility: PT when able Code Status: Per ED documentation, DNR but wife agreeable to intubation as needed Family Communication: No family at bedside Disposition: ICU    Critical care: 34 minutes   Simonne Martinet ACNP-BC [facility] Pulmonary/Critical Care Pager # [phone] OR # [phone] if no answer

## 2019-09-07 NOTE — Progress Notes (Signed)
EEG complete - results pending 

## 2019-09-07 NOTE — Progress Notes (Signed)
Initial Nutrition Assessment  DOCUMENTATION CODES:   Severe malnutrition in context of chronic illness, Underweight  INTERVENTION:   Monitor magnesium, potassium, and phosphorus daily for at least 3 days, MD to replete as needed, as pt is at risk for refeeding syndrome.  -d/c Vital HP & Prosource  -Initiate Vital 1.5 @ 30 ml/hr, advance by 10 ml every 12 hours to goal rate of 50 ml/hr. -Provides 1800 kcals, 81g protein and 916 ml H2O. -Free water per MD. Currently 200 ml every 6 hours (800 ml)  NUTRITION DIAGNOSIS:   Severe Malnutrition related to chronic illness (dementia, COPD, h/o CVA) as evidenced by severe fat depletion, severe muscle depletion, percent weight loss.  GOAL:   Patient will meet greater than or equal to 90% of their needs  MONITOR:   Vent status, Labs, Weight trends, TF tolerance, I & O's, Skin  REASON FOR ASSESSMENT:   Consult, Ventilator Enteral/tube feeding initiation and management  ASSESSMENT:   69 year old man with history of dementia, reported COPD, seizure disorder, hypertension, PVD, CVA, aspiration, recurrent UTI presenting from [facility] with dyspnea found to have acute hypoxic respiratory failure requiring intubation.  At his facility he was found to be 75% on room air.  Patient is currently intubated on ventilator support MV: 11.7 L/min Temp (24hrs), Avg:99.5 F (37.5 C), Min:97.8 F (36.6 C), Max:101.4 F (38.6 C)  Patient intubated 8/27, with OGT. TF protocol started over the weekend.  Pt with dementia, h/o CVA and AMS PTA. Currently sedated.   Propofol: 7.38 ml/hr -providing ~194 fat kcals  Per weight records, pt has lost 22 lbs since 6/11 (16% wt loss x 2.5 months, significant for time frame).   Medications: Colace, Miralax Labs reviewed: CBGs: 122 Low Phos  NUTRITION - FOCUSED PHYSICAL EXAM:    Most Recent Value  Orbital Region Severe depletion  Upper Arm Region Severe depletion  Thoracic and Lumbar Region Unable to  assess  Buccal Region Severe depletion  Temple Region Severe depletion  Clavicle Bone Region Severe depletion  Clavicle and Acromion Bone Region Severe depletion  Scapular Bone Region Severe depletion  Dorsal Hand Severe depletion  Patellar Region Unable to assess  Anterior Thigh Region Unable to assess  Posterior Calf Region Unable to assess  Edema (RD Assessment) Mild       Diet Order:   Diet Order            Diet NPO time specified  Diet effective now                 EDUCATION NEEDS:   Not appropriate for education at this time  Skin:  Skin Assessment: Skin Integrity Issues: Skin Integrity Issues:: Unstageable, Stage III Stage III: mid sacrum Unstageable: left ankle  Last BM:  PTA  Height:   Ht Readings from Last 1 Encounters:  09/05/19 5\' 11"  (1.803 m)    Weight:   Wt Readings from Last 1 Encounters:  09/05/19 51.6 kg    BMI:  Body mass index is 15.87 kg/m.  Estimated Nutritional Needs:   Kcal:  1847  Protein:  75-100g  Fluid:  1.8L/day   09/07/19, MS, RD, LDN Inpatient Clinical Dietitian Contact information available via Amion

## 2019-09-07 NOTE — Procedures (Signed)
Patient Name: Lee [name]  MRN: [id number]  Epilepsy Attending: Charlsie Quest  Referring Physician/Provider: Zenia Resides, NP Date: 09/07/2019 Duration: 26.29 mins  Patient history: 69yo M with h/o epilepsy now with ams. EEG to evaluate for seizure  Level of alertness:  comatose  AEDs during EEG study: LEV, CBZ, Clonazepam, LCM, VPA, propofol  Technical aspects: This EEG study was done with scalp electrodes positioned according to the 10-20 International system of electrode placement. Electrical activity was acquired at a sampling rate of 500Hz  and reviewed with a high frequency filter of 70Hz  and a low frequency filter of 1Hz . EEG data were recorded continuously and digitally stored.   Description:  EEG showed continuous generalized 3 to 6 Hz theta-delta slowing.  Hyperventilation and photic stimulation were not performed.     ABNORMALITY -Continuousslow, generalized  IMPRESSION: This study is suggestive of severe diffuse encephalopathy, nonspecific etiology. No seizures or epileptiform discharges were seen throughout the recording.   Charlee Squibb 

## 2019-09-07 NOTE — Progress Notes (Signed)
Pharmacy Antibiotic Note  Lee [name] is a 69 y.o. male admitted on 09/04/2019 with acute hypoxic resptratory failure requiring intubation 8/27, recurrent UTI.  Pharmacy has been consulted for meropenem dosing.  Plan: Meropenem 1g IV q8h Follow up renal function & cultures   Height: 5\' 11"  (180.3 cm) Weight: 51.6 kg (113 lb 12.1 oz) IBW/kg (Calculated) : 75.3  Temp (24hrs), Avg:99.1 F (37.3 C), Min:97.8 F (36.6 C), Max:100.5 F (38.1 C)  Recent Labs  Lab 09/04/19 2141 09/05/19 0035 09/05/19 0637 09/06/19 0741 09/07/19 0319  WBC 8.8  --  10.0 9.0 9.8  CREATININE 0.63  --  0.48* 0.49* 0.43*  LATICACIDVEN 3.7* 2.4*  --   --   --     Estimated Creatinine Clearance: 62.7 mL/min (A) (by C-G formula based on SCr of 0.43 mg/dL (L)).    Allergies  Allergen Reactions  . Orange Juice [Orange Oil] Diarrhea  . Vicodin [Hydrocodone-Acetaminophen] Other (See Comments)    "triggered seizure both here and at home when he tried to take it" (01/22/2012)  . Penicillins Nausea And Vomiting    Tolerates Zosyn and Unasyn. Has patient had a PCN reaction causing immediate rash, facial/tongue/throat swelling, SOB or lightheadedness with hypotension: no Has patient had a PCN reaction causing severe rash involving mucus membranes or skin necrosis: no Has patient had a PCN reaction that required hospitalization: no Has patient had a PCN reaction occurring within the last 10 years: no If all of the above answers are "NO", then may proceed with Cephalosporin u  . Oxycodone     Causes Seizures  . Vicodin [Hydrocodone-Acetaminophen]     Antimicrobials this admission: Cefepime 8/27 x1   Vancomycin 8/27 >> 8/30 Zosyn 8/28 >> 8/30 Meropenem 8/30 >>  Dose adjustments this admission:  Microbiology results: 8/27 BCx x1: ngtd 8/27 UCx: >100k ESBL E.coli 8/28 Trach asp: moderate normal flora  Thank you for allowing pharmacy to be a part of this patient's care.  9/28,  PharmD, BCPS Pharmacy: [phone] 09/07/2019 8:33 AM

## 2019-09-07 NOTE — TOC Initial Note (Signed)
Transition of Care [facility]) - Initial/Assessment Note    Patient Details  Name: Lee [name] MRN: [id number] Date of Birth: May 16, 1950  Transition of Care [facility]) CM/SW Contact:    Golda Acre, RN Phone Number: 09/07/2019, 11:18 AM  Clinical Narrative:                 Patient on Venmt at [id number]%fi02, temp 101.4, iv merrem, iv propofol, urinary culture -e.Coli From home with wife. Plan is to return to home. Expected Discharge Plan: Home/Self Care Barriers to Discharge: Continued Medical Work up   Patient Goals and CMS Choice Patient states their goals for this hospitalization and ongoing recovery are:: to go home CMS Medicare.gov Compare Post Acute Care list provided to:: Patient    Expected Discharge Plan and Services Expected Discharge Plan: Home/Self Care   Discharge Planning Services: CM Consult   Living arrangements for the past 2 months: Single Family Home                                      Prior Living Arrangements/Services Living arrangements for the past 2 months: Single Family Home Lives with:: Spouse Patient language and need for interpreter reviewed:: Yes Do you feel safe going back to the place where you live?: Yes      Need for Family Participation in Patient Care: Yes (Comment) Care giver support system in place?: Yes (comment)   Criminal Activity/Legal Involvement Pertinent to Current Situation/Hospitalization: No - Comment as needed  Activities of Daily Living      Permission Sought/Granted                  Emotional Assessment Appearance:: Appears stated age Attitude/Demeanor/Rapport: Engaged Affect (typically observed): Calm Orientation: : Oriented to Place, Oriented to Self, Oriented to  Time, Oriented to Situation Alcohol / Substance Use: Not Applicable Psych Involvement: No (comment)  Admission diagnosis:  SOB (shortness of breath) [R06.02] Severe sepsis (HCC) [A41.9, R65.20] Acute on chronic respiratory failure with  hypoxia (HCC) [J96.21] Acute respiratory failure with hypoxia and hypercarbia (HCC) [J96.01, J96.02] Aspiration pneumonia of right lower lobe, unspecified aspiration pneumonia type (HCC) [J69.0] Patient Active Problem List   Diagnosis Date Noted  . Acute respiratory failure with hypoxia and hypercarbia (HCC) 09/05/2019  . Metabolic acidosis with increased anion gap and accumulation of organic acids 06/19/2019  . Lactic acidosis 06/19/2019  . AKI (acute kidney injury) (HCC) 06/19/2019  . Palliative care by specialist   . Goals of care, counseling/discussion   . DNR (do not resuscitate)   . Multifocal pneumonia 05/14/2019  . Sepsis (HCC) 03/29/2019  . Macrocytosis   . Protein-calorie malnutrition, severe 03/04/2019  . Acute respiratory failure with hypoxemia (HCC) 02/23/2019  . Pressure injury of skin 06/30/2018  . Acute respiratory failure with hypoxia (HCC)   . Seizure disorder (HCC) 06/25/2018  . Seizure (HCC) 08/29/2016  . Leukocytosis 08/29/2016  . History of CVA (cerebrovascular accident) 08/29/2016  . Somnolence 08/29/2016  . Post-ictal state (HCC) 08/29/2016  . Lip laceration   . Pain   . Leg weakness, bilateral   . Leg pain, bilateral 08/17/2016  . UTI (urinary tract infection) 08/15/2016  . Aggression 07/26/2016  . Dementia (HCC) 07/26/2016  . Elevated troponin 12/30/2015  . Chest pain 12/30/2015  . Aspiration pneumonia (HCC) 11/15/2013  . Status epilepticus (HCC) 11/14/2013  . Essential hypertension 11/14/2013  . Cerebral thrombosis with cerebral infarction (HCC)  11/07/2013  . Seizures (HCC) 11/06/2013  . Chronic airway obstruction, not elsewhere classified 09/22/2013  . Stroke (HCC) 09/22/2013  . Hyperlipidemia 09/18/2013  . Encephalopathy acute 09/11/2013  . Altered mental status 09/11/2013  . CVA (cerebral infarction) 09/11/2013  . Hyponatremia 09/11/2013  . Acute encephalopathy 09/11/2013  . Aftercare following surgery of the circulatory system, NEC  08/25/2012  . Foot swelling 02/04/2012  . Drainage from wound 01/04/2012  . Peripheral vascular disease (HCC) 11/05/2011  . Pain in limb 11/05/2011  . 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 PAD (peripheral artery disease) (HCC) 10/28/2011  . TOBACCO ABUSE 02/06/2007  . SYMPTOM, EDEMA 06/13/2006  . ERECTILE DYSFUNCTION 01/25/2006   PCP:  Patient, No Pcp Per Pharmacy:  No Pharmacies Listed    Social Determinants of Health (SDOH) Interventions    Readmission Risk Interventions Readmission Risk Prevention Plan 06/22/2019  Transportation Screening Complete  PCP or Specialist Appt within 3-5 Days Complete  HRI or Home Care Consult Complete  Social Work Consult for Recovery Care Planning/Counseling Complete  Palliative Care Screening Complete  Medication Review Oceanographer) Referral to Pharmacy  Some recent data might be hidden

## 2019-09-08 ENCOUNTER — Inpatient Hospital Stay (HOSPITAL_COMMUNITY): Payer: Medicare Other

## 2019-09-08 DIAGNOSIS — J9621 Acute and chronic respiratory failure with hypoxia: Secondary | ICD-10-CM

## 2019-09-08 DIAGNOSIS — J9602 Acute respiratory failure with hypercapnia: Secondary | ICD-10-CM

## 2019-09-08 DIAGNOSIS — J9601 Acute respiratory failure with hypoxia: Secondary | ICD-10-CM

## 2019-09-08 LAB — CBC
HCT: 37.5 % — ABNORMAL LOW (ref 39.0–52.0)
Hemoglobin: 12.2 g/dL — ABNORMAL LOW (ref 13.0–17.0)
MCH: 31.7 pg (ref 26.0–34.0)
MCHC: 32.5 g/dL (ref 30.0–36.0)
MCV: 97.4 fL (ref 80.0–100.0)
Platelets: 312 10*3/uL (ref 150–400)
RBC: 3.85 MIL/uL — ABNORMAL LOW (ref 4.22–5.81)
RDW: 15.8 % — ABNORMAL HIGH (ref 11.5–15.5)
WBC: 11.4 10*3/uL — ABNORMAL HIGH (ref 4.0–10.5)
nRBC: 0 % (ref 0.0–0.2)

## 2019-09-08 LAB — COMPREHENSIVE METABOLIC PANEL
ALT: 55 U/L — ABNORMAL HIGH (ref 0–44)
AST: 79 U/L — ABNORMAL HIGH (ref 15–41)
Albumin: 2.3 g/dL — ABNORMAL LOW (ref 3.5–5.0)
Alkaline Phosphatase: 110 U/L (ref 38–126)
Anion gap: 11 (ref 5–15)
BUN: 11 mg/dL (ref 8–23)
CO2: 29 mmol/L (ref 22–32)
Calcium: 8.3 mg/dL — ABNORMAL LOW (ref 8.9–10.3)
Chloride: 102 mmol/L (ref 98–111)
Creatinine, Ser: 0.5 mg/dL — ABNORMAL LOW (ref 0.61–1.24)
GFR calc Af Amer: >60 mL/min (ref >60)
GFR calc non Af Amer: >60 mL/min (ref >60)
Glucose, Bld: 157 mg/dL — ABNORMAL HIGH (ref 70–99)
Potassium: 3.4 mmol/L — ABNORMAL LOW (ref 3.5–5.1)
Sodium: 142 mmol/L (ref 135–145)
Total Bilirubin: 0.7 mg/dL (ref 0.3–1.2)
Total Protein: 6.4 g/dL — ABNORMAL LOW (ref 6.5–8.1)

## 2019-09-08 LAB — GLUCOSE, CAPILLARY
Glucose-Capillary: 107 mg/dL — ABNORMAL HIGH (ref 70–99)
Glucose-Capillary: 127 mg/dL — ABNORMAL HIGH (ref 70–99)
Glucose-Capillary: 129 mg/dL — ABNORMAL HIGH (ref 70–99)
Glucose-Capillary: 141 mg/dL — ABNORMAL HIGH (ref 70–99)
Glucose-Capillary: 142 mg/dL — ABNORMAL HIGH (ref 70–99)
Glucose-Capillary: 157 mg/dL — ABNORMAL HIGH (ref 70–99)

## 2019-09-08 LAB — PHOSPHORUS: Phosphorus: 1.8 mg/dL — ABNORMAL LOW (ref 2.5–4.6)

## 2019-09-08 LAB — PROCALCITONIN: Procalcitonin: 2.19 ng/mL

## 2019-09-08 MED ORDER — POTASSIUM CHLORIDE 20 MEQ/15ML (10%) PO SOLN
40.0000 meq | ORAL | Status: AC
  Administered 2019-09-08 (×2): 40 meq
  Filled 2019-09-08 (×2): qty 30

## 2019-09-08 MED ORDER — PROSOURCE TF PO LIQD
45.0000 mL | Freq: Every day | ORAL | Status: DC
  Administered 2019-09-08 – 2019-09-11 (×4): 45 mL
  Filled 2019-09-08 (×4): qty 45

## 2019-09-08 MED ORDER — AMLODIPINE BESYLATE 10 MG PO TABS
10.0000 mg | ORAL_TABLET | Freq: Every day | ORAL | Status: DC
  Administered 2019-09-08 – 2019-09-11 (×3): 10 mg
  Filled 2019-09-08 (×4): qty 1

## 2019-09-08 MED ORDER — POTASSIUM PHOSPHATES 15 MMOLE/5ML IV SOLN
30.0000 mmol | Freq: Once | INTRAVENOUS | Status: AC
  Administered 2019-09-08: 30 mmol via INTRAVENOUS
  Filled 2019-09-08: qty 10

## 2019-09-08 MED ORDER — OSMOLITE 1.5 CAL PO LIQD
1000.0000 mL | ORAL | Status: DC
  Administered 2019-09-08 – 2019-09-10 (×3): 1000 mL
  Filled 2019-09-08 (×5): qty 1000

## 2019-09-08 MED ORDER — FUROSEMIDE 10 MG/ML IJ SOLN
40.0000 mg | Freq: Once | INTRAMUSCULAR | Status: AC
  Administered 2019-09-08: 40 mg via INTRAVENOUS
  Filled 2019-09-08: qty 4

## 2019-09-08 NOTE — Progress Notes (Signed)
NAME:  Lee [name], MRN:  [id number], DOB:  May 16, 1950, LOS: 3 ADMISSION DATE:  09/04/2019, CONSULTATION DATE:  09/05/2019 REFERRING MD:  EDP, CHIEF COMPLAINT: Respiratory failure  Brief History   69 year old man with history of dementia, reported COPD, seizure disorder, hypertension, PVD, CVA, aspiration, recurrent UTI presenting from [facility] with dyspnea found to have acute hypoxic respiratory failure requiring intubation.  At his facility he was found to be 75% on room air.   [name] He was recently hospitalized from 6/11 to 6/16 after multiple episodes of seizure.  He was treated for pneumonia as well as ESBL E. coli UTI    Past Medical History  dementia, reported COPD, seizure disorder, hypertension, PVD, CVA, aspiration, recurrent UTI  Significant Hospital Events   8/27>Intubated 8/28>Admit 8/30 placed on spontaneous breathing trial, F/VT acceptable however work of breathing noted to be increased.  ESBL producing E. coli noted in urine, changed to meropenem hemodynamically stable however still minimally responsive, IV Lasix given.  Hydralazine resume for hypertension.  EEG obtained negative for seizure activity 8/31: Passing spontaneous breathing trial, trial of extubation Consults:  None  Procedures:  8/27> ETT  Significant Diagnostic Tests:  CXR 8/27> Right greater than left lower lung airspace disease. No pleural effusion.  Micro Data:  Resp Cx 8/28: rare gpc pairs Urine Cx 8/27: >100K Ecoli (ESBL producer) BCx 8/27> COVID19 8/27> Neg  Antimicrobials:  Vanc 8/27> 8/30 Cefepime 8/27>8/27 Zosyn 8/28>8/30 Meropenem (ESBL producer ecoli) 8/30>>> Interim history/subjective:  Looks comfortable on pressure support ventilation   Objective   Blood pressure (Abnormal) 154/81, pulse 97, temperature (Abnormal) 100.6 F (38.1 C), temperature source Axillary, resp. rate 20, height 5\' 11"  (1.803 m), weight 51.6 kg, SpO2 98 %.    Vent Mode: PSV;CPAP FiO2 (%):  [40 %] 40  % Set Rate:  [20 bmp] 20 bmp Vt Set:  [600 mL] 600 mL PEEP:  [5 cmH20] 5 cmH20 Pressure Support:  [5 cmH20-10 cmH20] 5 cmH20 Plateau Pressure:  [16 cmH20-17 cmH20] 17 cmH20   Intake/Output Summary (Last 24 hours) at 09/08/2019 09/10/2019 Last data filed at 09/08/2019 0600 Gross per 24 hour  Intake 1420.97 ml  Output 2545 ml  Net -1124.03 ml   Filed Weights   09/04/19 2338 09/04/19 2340 09/05/19 1631  Weight: 61 kg 61.5 kg 51.6 kg    Examination:  General this is a frail 69 year old black male he is currently on pressure support ventilation, and appears comfortable HEENT normocephalic atraumatic with the exception of some temporal wasting.  He is orally intubated mucous membranes are moist Pulmonary: Clear to auscultation, currently on PEEP 5/pressure support 5.  Tidal volumes averaging in the high 300-400 range, respiratory rate low 20s, no notable accessory use on these settings appears comfortable Cardiac regular rate and rhythm Abdomen soft nontender Extremities with dependent edema, palpable pulses.  Multiple areas of pressure sores with dressings in place he is contracted in both uppers and lower extremities Neuro, has tremor, I cannot get him to follow commands but he is more interactive today.  He is contracted in both upper and lower extremities  Resolved problems:  Mild transamininitis  Assessment & Plan:  69 year old man with history of dementia, reported COPD, seizure disorder, hypertension, PVD, CVA, aspiration, recurrent UTI presenting from [facility] with dyspnea found to have acute hypoxic respiratory failure now intubated.  Acute hypoxic and hypercarbic respiratory failure:  2/2 aspiration PNA (normal flora in the sputum) Reported history of COPD Portable chest x-ray personally reviewed: Improved bilateral  aeration Frequency tidal volume ratio acceptable Mental status a little better today, I am not sure what his baseline is Plan Extubate  IV Lasix x1   Supplemental oxygen with continuous pulse oximetry  Pulmonary hygiene measures as able  Placing nasogastric tube, I doubt he will be able to safely take p.o.'s anytime soon  Ongoing discussion about goals of care with wife  Acute encephalopathy superimposed on advanced dementia, further complicated by seizure disorder Plan Continue Tegretol, Vimpat, valproic acid and scheduled clonazepam  Discontinue all sedated medications other than his anticonvulsants  ESBL  UTI -Patient presented with tachypnea, tachycardia, febrile, with acute concern for infection leading to criteria for sepsis.  Patient additionally meets criteria for severe sepsis given positive lactic acid and altered mental status on admission. Plan Meropenem day #2, will plan on 7 days therapy Need to remove Foley catheter if we can, it is not clear to me if this is chronic or not we will discuss with his wife  HTN:  -Home medications include Catapres, hydralazine, and Norvasc Plan Added back scheduled hydralazine  IV Lasix today  Add back Norvasc We will hold off on Catapres for now  Intermittent fluid and electrolyte imbalance: Hypokalemia Plan We will continue to monitor, replace, recheck a.m.  Best practice:  Diet: NPO, consider starting tube feeds in a.m. Pain/Anxiety/Delirium protocol (if indicated): Fentanyl as needed, propofol gtt. VAP protocol (if indicated): Ordered DVT prophylaxis: Subq Lovenox GI prophylaxis: PPI Glucose control: Monitor Mobility: PT when able Code Status: Per ED documentation, DNR but wife agreeable to intubation as needed Family Communication: No family at bedside Disposition: ICU    Critical care: 32 minutes    Simonne Martinet ACNP-BC [facility] Pulmonary/Critical Care Pager # [phone] OR # [phone] if no answer

## 2019-09-08 NOTE — Progress Notes (Signed)
eLink Physician-Brief Progress Note Patient Name: EVERHETT BOZARD DOB: Jun 19, 1950 MRN: [id number]   Date of Service  09/08/2019  HPI/Events of Note  Patient reportedly had a transient staring episode but is back to his baseline after receiving he scheduled seizure medications, he has a history of seizures.  eICU Interventions  Monitor overnight, no intervention at this time. If episode recurs will place him on cEEG.        Migdalia Dk 09/08/2019, 9:53 PM

## 2019-09-08 NOTE — Progress Notes (Signed)
[facility] ADULT ICU REPLACEMENT PROTOCOL   The patient does apply for the [facility] Adult ICU Electrolyte Replacment Protocol based on the criteria listed below:   1. Is GFR >/= 30 ml/min? Yes.    Patient's GFR today is >60 2. Is SCr </= 2? Yes.   Patient's SCr is 0.50 ml/kg/hr 3. Did SCr increase >/= 0.5 in 24 hours? No. 4. Abnormal electrolyte(s): K+ 3.4, Phos 1.8 5. Ordered repletion with: Protocol 6. If a panic level lab has been reported, has the CCM MD in charge been notified? Yes.  .   Physician:  Reyne Dumas 09/08/2019 4:06 AM

## 2019-09-08 NOTE — Procedures (Signed)
Extubation Procedure Note  Patient Details:   Name: Lee [name] DOB: [birth year] MRN: [id number]   Airway Documentation:  Airway 8 mm (Active)  Secured at (cm) 24 cm 09/08/19 0815  Measured From Lips 09/08/19 0815  Secured Location Left 09/08/19 0815  Secured By [employer] 09/08/19 0815  Tube Holder Repositioned Yes 09/08/19 0815  Cuff Pressure (cm H2O) 26 cm H2O 09/07/19 2356  Site Condition Dry 09/08/19 0815   Vent end date: 09/08/19 Vent end time: 1030   Evaluation  O2 sats: stable throughout Complications: No apparent complications Patient did tolerate procedure well. Bilateral Breath Sounds: Diminished   No   Extubated to 4 L.   Dannielle Karvonen 09/08/2019, 10:33 AM

## 2019-09-08 NOTE — Progress Notes (Signed)
Nutrition Follow-up  DOCUMENTATION CODES:   Severe malnutrition in context of chronic illness, Underweight  INTERVENTION:  - will adjust TF regimen: Osmolite 1.5 @ 40 ml/hr with 45 ml Prosource TF once/day and 200 ml free water QID. - at goal rate, this regimen will provide 1840 kcal, 86 grams protein, and 1714 ml free water.   - Monitor magnesium, potassium, and phosphorus daily for at least 3 days, MD to replete as needed, as pt is at risk for refeeding syndrome given severe malnutrition, current mild hypokalemia.  NUTRITION DIAGNOSIS:   Severe Malnutrition related to chronic illness (dementia, COPD, h/o CVA) as evidenced by severe fat depletion, severe muscle depletion, percent weight loss. -ongoing  GOAL:   Patient will meet greater than or equal to 90% of their needs -to be met with TF regimen  MONITOR:   TF tolerance, Diet advancement, Labs, Weight trends, Skin  ASSESSMENT:   69 year old man with history of dementia, reported COPD, seizure disorder, hypertension, PVD, CVA, aspiration, recurrent UTI presenting from [facility] with dyspnea found to have acute hypoxic respiratory failure requiring intubation.  At his facility he was found to be 75% on room air.  Patient was discussed in rounds this AM. He was extubated today at ~1030 and NGT remains in place. He is receiving Vital 1.5 @ 40 ml/hr with 200 ml free water QID which is providing 1440 kcal, 65 grams protein, and 1533 kcal.   He has not been weighed since 8/28.  Patient laying in bed with no family or visitors present.     Labs reviewed; CBGs: 141, 157, 127 mg/dl, K: 3.4 mmol/l, creatinine: 0.5 mg/dl, Ca: 8.3 mg/dl, Phos: 1.8 mg/dl, LFTs slightly elevated. Medications reviewed; 100 mg colace BID, 40 mg IV lasix/day, 40 mg protonix/day, 17 g miralax/day, 30 mmol IV KPhos x1 run 8/31.    Diet Order:   Diet Order            Diet NPO time specified  Diet effective now                 EDUCATION NEEDS:    Not appropriate for education at this time  Skin:  Skin Assessment: Skin Integrity Issues: Skin Integrity Issues:: Stage II, Stage III, Unstageable Stage II: bilateral ankles Stage III: mid sacrum Unstageable: full thickness to L ankle  Last BM:  unknown  Height:   Ht Readings from Last 1 Encounters:  09/07/19 _0  (1.803 m)    Weight:   Wt Readings from Last 1 Encounters:  09/05/19 51.6 kg     Estimated Nutritional Needs:  Kcal:  1860-2060 kcal Protein:  85-100 grams Fluid:  >/= 2 L/day      Jarome Matin, MS, RD, LDN, CNSC Inpatient Clinical Dietitian RD pager # available in [place]  After hours/weekend pager # available in [facility]

## 2019-09-09 ENCOUNTER — Inpatient Hospital Stay (HOSPITAL_COMMUNITY): Payer: Medicare Other

## 2019-09-09 LAB — GLUCOSE, CAPILLARY
Glucose-Capillary: 131 mg/dL — ABNORMAL HIGH (ref 70–99)
Glucose-Capillary: 136 mg/dL — ABNORMAL HIGH (ref 70–99)
Glucose-Capillary: 121 mg/dL — ABNORMAL HIGH (ref 70–99)
Glucose-Capillary: 124 mg/dL — ABNORMAL HIGH (ref 70–99)
Glucose-Capillary: 115 mg/dL — ABNORMAL HIGH (ref 70–99)
Glucose-Capillary: 114 mg/dL — ABNORMAL HIGH (ref 70–99)

## 2019-09-09 LAB — BASIC METABOLIC PANEL
Anion gap: 12 (ref 5–15)
BUN: 12 mg/dL (ref 8–23)
CO2: 31 mmol/L (ref 22–32)
Calcium: 8.8 mg/dL — ABNORMAL LOW (ref 8.9–10.3)
Chloride: 102 mmol/L (ref 98–111)
Creatinine, Ser: 0.53 mg/dL — ABNORMAL LOW (ref 0.61–1.24)
GFR calc Af Amer: >60 mL/min (ref >60)
GFR calc non Af Amer: >60 mL/min (ref >60)
Glucose, Bld: 137 mg/dL — ABNORMAL HIGH (ref 70–99)
Potassium: 4.2 mmol/L (ref 3.5–5.1)
Sodium: 145 mmol/L (ref 135–145)

## 2019-09-09 LAB — PHOSPHORUS: Phosphorus: 2.5 mg/dL (ref 2.5–4.6)

## 2019-09-09 MED ORDER — DOCUSATE SODIUM 50 MG/5ML PO LIQD
100.0000 mg | Freq: Two times a day (BID) | ORAL | Status: DC
  Administered 2019-09-09 – 2019-09-14 (×11): 100 mg
  Filled 2019-09-09 (×12): qty 10

## 2019-09-09 MED ORDER — GUAIFENESIN 100 MG/5ML PO SOLN
15.0000 mL | ORAL | Status: DC
  Administered 2019-09-09 – 2019-09-12 (×18): 300 mg
  Filled 2019-09-09 (×3): qty 20
  Filled 2019-09-09: qty 10
  Filled 2019-09-09: qty 20
  Filled 2019-09-09 (×3): qty 10
  Filled 2019-09-09 (×4): qty 20
  Filled 2019-09-09 (×3): qty 10
  Filled 2019-09-09: qty 30
  Filled 2019-09-09 (×3): qty 20

## 2019-09-09 MED ORDER — POLYETHYLENE GLYCOL 3350 17 G PO PACK
17.0000 g | PACK | Freq: Every day | ORAL | Status: DC
  Administered 2019-09-10 – 2019-09-14 (×4): 17 g
  Filled 2019-09-09 (×6): qty 1

## 2019-09-09 MED ORDER — FREE WATER
200.0000 mL | Status: DC
  Administered 2019-09-09 – 2019-09-11 (×8): 200 mL

## 2019-09-09 MED ORDER — HYDRALAZINE HCL 50 MG PO TABS
100.0000 mg | ORAL_TABLET | Freq: Three times a day (TID) | ORAL | Status: DC
  Administered 2019-09-09 – 2019-09-11 (×4): 100 mg
  Filled 2019-09-09 (×5): qty 2

## 2019-09-09 NOTE — Progress Notes (Signed)
eLink Physician-Brief Progress Note Patient Name: Lee [name] DOB: 03/29/1950 MRN: [id number]   Date of Service  09/09/2019  HPI/Events of Note  Gastric tube repositioned - Review of abdominal film reveals tip/sideport in proximal stomach. Gastric tube has not advance much if at all.   eICU Interventions  Plan: 1. May give medications via gastric tube tonight with small volumes. No enteral feedings.  2. Rounding team to address replacement or repositioning of gastric tube in AM.      Intervention Category Major Interventions: Other:  Lenell Antu 09/09/2019, 9:33 PM

## 2019-09-09 NOTE — Progress Notes (Signed)
eLink Physician-Brief Progress Note Patient Name: Lee [name] DOB: January 18, 1950 MRN: [id number]   Date of Service  09/09/2019  HPI/Events of Note  Request to review abdominal film for gastric tube placement. Gastric tube just past GE junction.   eICU Interventions  Please advance gastric tube by 20 cm and repeat abdominal film to confirm new placement.      Intervention Category Major Interventions: Other:  Lenell Antu 09/09/2019, 7:52 PM

## 2019-09-09 NOTE — Progress Notes (Signed)
NAME:  Lee [name], MRN:  [id number], DOB:  1950-05-28, LOS: 4 ADMISSION DATE:  09/04/2019, CONSULTATION DATE:  09/05/2019 REFERRING MD:  EDP, CHIEF COMPLAINT: Respiratory failure  Brief History   69 year old man with history of dementia, reported COPD, seizure disorder, hypertension, PVD, CVA, aspiration, recurrent UTI presenting from [facility] with dyspnea found to have acute hypoxic respiratory failure requiring intubation.  At his facility he was found to be 75% on room air.   [name] He was recently hospitalized from 6/11 to 6/16 after multiple episodes of seizure.  He was treated for pneumonia as well as ESBL E. coli UTI    Past Medical History  dementia, reported COPD, seizure disorder, hypertension, PVD, CVA, aspiration, recurrent UTI  Significant Hospital Events   8/27>Intubated 8/28>Admit 8/30 placed on spontaneous breathing trial, F/VT acceptable however work of breathing noted to be increased.  ESBL producing E. coli noted in urine, changed to meropenem hemodynamically stable however still minimally responsive, IV Lasix given.  Hydralazine resume for hypertension.  EEG obtained negative for seizure activity 8/31: Passing spontaneous breathing trial, trial of extubation, required freq NTS 9/1 spiking fever. Needing freq sxn. Nursing wondering about possible seizure activity but resolved spont.   Consults:  None  Procedures:  8/27> ETT-->extubated 8/31   Significant Diagnostic Tests:  CXR 8/27> Right greater than left lower lung airspace disease. No pleural effusion.  Micro Data:  Resp Cx 8/28: rare gpc pairs Urine Cx 8/27: >100K Ecoli (ESBL producer) BCx 8/27> COVID19 8/27> Neg  Antimicrobials:  Vanc 8/27> 8/30 Cefepime 8/27>8/27 Zosyn 8/28>8/30 Meropenem (ESBL producer ecoli) 8/30>>> Interim history/subjective:  Awake, interactive but not verbal  Objective   Blood pressure (Abnormal) 133/34, pulse 96, temperature (Abnormal) 100.5 F (38.1 C),  temperature source Axillary, resp. rate (Abnormal) 33, height 5\' 11"  (1.803 m), weight 52.1 kg, SpO2 98 %.    Vent Mode: PSV;CPAP FiO2 (%):  [40 %] 40 % PEEP:  [5 cmH20] 5 cmH20 Pressure Support:  [5 cmH20] 5 cmH20   Intake/Output Summary (Last 24 hours) at 09/09/2019 0759 Last data filed at 09/09/2019 11/09/2019 Gross per 24 hour  Intake 1872.3 ml  Output 1750 ml  Net 122.3 ml   Filed Weights   09/04/19 2340 09/05/19 1631 09/09/19 0500  Weight: 61.5 kg 51.6 kg 52.1 kg    Examination:  General frail contracted debilitated 69 year old black male currently lying in bed still requiring frequent suctioning via nasal tracheal suctioning HEENT normocephalic atraumatic with the exception of temporal wasting mucous membranes are dry neck veins are flat Pulmonary coarse scattered rhonchi mildly tachypneic supplemental oxygen requirements have increased overnight now on 6 L via nasal cannula mild accessory use Cardiac regular rate tachycardic no murmur rub or gallop Abdomen soft not tender tolerating tube feeds Extremities are all contracted with trace edema pulses are palpable has several dressings over pressure points Neuro awake nonverbal contracted not following commands but seems to track with eyes GU clear yellow  Resolved problems:  Mild transamininitis  Assessment & Plan:  69 year old man with history of dementia, reported COPD, seizure disorder, hypertension, PVD, CVA, aspiration, recurrent UTI presenting from [facility] with dyspnea found to have acute hypoxic respiratory failure now intubated.  Acute hypoxic and hypercarbic respiratory failure:  2/2 aspiration PNA (normal flora in the sputum) Reported history of COPD Extubated 8/31->oxygen requirements increased Plan NPO Supplemental oxygen for sats > 90% Cont pulse ox High risk for intubation. Will need to revisit this w/ wife again.  Ensure free water  intake via tube and add Mucinex, hopefully this will help with secretion  management  Acute encephalopathy superimposed on advanced dementia, further complicated by seizure disorder Plan Cont tegretol, vimpat, valproic acid and clonazepam Seizure precautions  ESBL  UTI -Patient presented with tachypnea, tachycardia, febrile, with acute concern for infection leading to criteria for sepsis.  Patient additionally meets criteria for severe sepsis given positive lactic acid and altered mental status on admission. Plan Meropenem day 3 of 7  New fever and worsening Leukocytosis. Extubated 8/31. On Carbapenem so doubt new infxn unless MRSA; could all be atx  Plan Cont to trend Repeat sputum if intubated   HTN:  -Home medications include Catapres, hydralazine, and Norvasc Plan Cont hydralazine and norvasc; will increase hydralazine. I do not think Clonidine is a good choice for him will not resume   Intermittent fluid and electrolyte imbalance Plan Cont to monitor and replace as indicated  Best practice:  Diet: NPO, tubefeeds Pain/Anxiety/Delirium protocol (if indicated): Fentanyl as needed, propofol gtt.->stopped 8/31 VAP protocol (if indicated): Ordered DVT prophylaxis: Subq Lovenox GI prophylaxis: PPI Glucose control: Monitor Mobility: PT when able Code Status: Per ED documentation, DNR but wife agreeable to intubation as needed-->will revisit  Family Communication: No family at bedside Disposition: We will keep in intensive care 1 more day, he is requiring fairly aggressive pulmonary hygiene.  Will discuss reintubation with family, he is at significantly high risk for this.  That being said if he is reintubated I do not see the outcome being much different than it was this first time we extubated him    Critical care: 31 minutes   Simonne Martinet ACNP-BC [facility] Pulmonary/Critical Care Pager # [phone] OR # [phone] if no answer

## 2019-09-09 NOTE — Progress Notes (Signed)
Pt NT suctioned x2 down left nare for copious amounts of yellow/tan secretions.  Pt tolerated well.

## 2019-09-09 NOTE — TOC Progression Note (Signed)
Transition of Care [facility]) - Progression Note    Patient Details  Name: Lee [name] MRN: [id number] Date of Birth: 05/29/1950  Transition of Care [facility]) CM/SW Contact  Golda Acre, RN Phone Number: 09/09/2019, 10:01 AM  Clinical Narrative:    69 year old man with history of dementia, reported COPD, seizure disorder, hypertension, PVD, CVA, aspiration, recurrent UTI presenting from [facility] with dyspnea found to have acute hypoxic respiratory failure requiring intubation.  At his facility he was found to be 75% on room air.   [name] He was recently hospitalized from 6/11 to 6/16 after multiple episodes of seizure.  He was treated for pneumonia as well as ESBL E. coli UTI  Extubated on [id number] to 6l/[place], Twmp 101.7, tube feeds via tube, Merremiv. Following for progression and toc needs. Expected Discharge Plan: Home/Self Care Barriers to Discharge: Continued Medical Work up  Expected Discharge Plan and Services Expected Discharge Plan: Home/Self Care   Discharge Planning Services: CM Consult   Living arrangements for the past 2 months: Single Family Home                                       Social Determinants of Health (SDOH) Interventions    Readmission Risk Interventions Readmission Risk Prevention Plan 06/22/2019  Transportation Screening Complete  PCP or Specialist Appt within 3-5 Days Complete  HRI or Home Care Consult Complete  Social Work Consult for Recovery Care Planning/Counseling Complete  Palliative Care Screening Complete  Medication Review Oceanographer) Referral to Pharmacy  Some recent data might be hidden

## 2019-09-10 ENCOUNTER — Inpatient Hospital Stay (HOSPITAL_COMMUNITY): Payer: Medicare Other

## 2019-09-10 LAB — GLUCOSE, CAPILLARY
Glucose-Capillary: 113 mg/dL — ABNORMAL HIGH (ref 70–99)
Glucose-Capillary: 107 mg/dL — ABNORMAL HIGH (ref 70–99)
Glucose-Capillary: 128 mg/dL — ABNORMAL HIGH (ref 70–99)
Glucose-Capillary: 128 mg/dL — ABNORMAL HIGH (ref 70–99)
Glucose-Capillary: 141 mg/dL — ABNORMAL HIGH (ref 70–99)

## 2019-09-10 LAB — CULTURE, BLOOD (ROUTINE X 2)
Culture: NO GROWTH
Special Requests: ADEQUATE

## 2019-09-10 NOTE — Progress Notes (Signed)
NAME:  Lee [name], MRN:  [id number], DOB:  May 14, 1950, LOS: 5 ADMISSION DATE:  09/04/2019, CONSULTATION DATE:  09/05/2019 REFERRING MD:  EDP, CHIEF COMPLAINT: Respiratory failure  Brief History   69 year old man with history of dementia, reported COPD, seizure disorder, hypertension, PVD, CVA, aspiration, recurrent UTI presenting from [facility] with dyspnea found to have acute hypoxic respiratory failure requiring intubation.  At his facility he was found to be 75% on room air.   [name] He was recently hospitalized from 6/11 to 6/16 after multiple episodes of seizure.  He was treated for pneumonia as well as ESBL E. coli UTI    Past Medical History  dementia, reported COPD, seizure disorder, hypertension, PVD, CVA, aspiration, recurrent UTI  Significant Hospital Events   8/27>Intubated 8/28>Admit 8/30 placed on spontaneous breathing trial, F/VT acceptable however work of breathing noted to be increased.  ESBL producing E. coli noted in urine, changed to meropenem hemodynamically stable however still minimally responsive, IV Lasix given.  Hydralazine resume for hypertension.  EEG obtained negative for seizure activity 8/31: Passing spontaneous breathing trial, trial of extubation, required freq NTS 9/1 spiking fever. Needing freq sxn. Nursing wondering about possible seizure activity but resolved spont.  9/2: More awake.  Oxygen requirements down to 3 L from 6.  Actually attempting to verbalize.  Feeding tube malpositioned, having it replaced.  Changing to stepdown status.  Asking [facility] to assume care.  Placing palliative care consult as well to further discuss goals of care specifically in regards to potential PEG needs, risk of reintubation, long-term goals. Consults:  None  Procedures:  8/27> ETT-->extubated 8/31   Significant Diagnostic Tests:  CXR 8/27> Right greater than left lower lung airspace disease. No pleural effusion.  Micro Data:  Resp Cx 8/28: rare gpc  pairs Urine Cx 8/27: >100K Ecoli (ESBL producer) BCx 8/27> COVID19 8/27> Neg  Antimicrobials:  Vanc 8/27> 8/30 Cefepime 8/27>8/27 Zosyn 8/28>8/30 Meropenem (ESBL producer ecoli) 8/30>>> Interim history/subjective:  Awake, interactive but not verbal  Objective   Blood pressure (Abnormal) 130/103, pulse 95, temperature 99.7 F (37.6 C), temperature source Axillary, resp. rate (Abnormal) 24, height 5\' 11"  (1.803 m), weight 52.1 kg, SpO2 98 %.        Intake/Output Summary (Last 24 hours) at 09/10/2019 0839 Last data filed at 09/09/2019 2305 Gross per 24 hour  Intake 350.56 ml  Output 600 ml  Net -249.44 ml   Filed Weights   09/04/19 2340 09/05/19 1631 09/09/19 0500  Weight: 61.5 kg 51.6 kg 52.1 kg    Examination: General frail contracted 69 year old black male resting in bed more awake and actually verbally interactive today attempting to communicate HEENT mucous membranes are very dry, sclera nonicteric.  No JVD does have temporal wasting Pulmonary: Coarse scattered rhonchi throughout.  No accessory use.  Oxygen now down to 3 L via nasal cannula Cardiac regular rate and rhythm without murmur rub or gallop The abdomen is soft, not tender no organomegaly.  Nasogastric feeding tube in place, however tip just barely in the anterior stomach Extremities contracted, multiple areas of pressure ulcerations with dressings in place Neuro: Awake, verbal, not oriented but attempts to communicate will follow some commands GU clear yellow  Resolved problems:  Mild transamininitis  Assessment & Plan:  69 year old man with history of dementia, reported COPD, seizure disorder, hypertension, PVD, CVA, aspiration, recurrent UTI presenting from [facility] with dyspnea found to have acute hypoxic respiratory failure now intubated.  Acute hypoxic and hypercarbic respiratory failure:  2/2  aspiration PNA (normal flora in the sputum) Reported history of COPD Extubated 8/31->oxygen requirements  increased Plan SLP evaluation for dysphagia, he will need objective swallowing study as well as discussion with family in regards to possible alternative route of feeding should he have significant swallowing dysfunction  Supplemental oxygen  Ensure adequate water intake, continue Mucinex  Continue pulse oximetry  We will ask palliative care to see Can change to stepdown status   Acute encephalopathy superimposed on advanced dementia, further complicated by seizure disorder Plan Cont tegretol, vimpat, valproic acid and clonazepam Seizure precautions  ESBL  UTI -Patient presented with tachypnea, tachycardia, febrile, with acute concern for infection leading to criteria for sepsis.  Patient additionally meets criteria for severe sepsis given positive lactic acid and altered mental status on admission. Plan Meropenem day 4 of 7    New fever and worsening Leukocytosis. Extubated 8/31. On Carbapenem so doubt new infxn unless MRSA; could all be atx  Plan Continue to trend white blood cell and fever curve   HTN:  -Home medications include Catapres, hydralazine, and Norvasc Plan Continuing hydralazine at increased dosing from home, continue Norvasc  Have her refrain from adding back clonidine, I do not think this is a good choice for him    Intermittent fluid and electrolyte imbalance Plan Monitor and replace as needed  Best practice:  Diet: NPO, tubefeeds Pain/Anxiety/Delirium protocol (if indicated): Fentanyl as needed, propofol gtt.->stopped 8/31 VAP protocol (if indicated): Ordered DVT prophylaxis: Subq Lovenox GI prophylaxis: PPI Glucose control: Monitor Mobility: PT when able Code Status: Per ED documentation, DNR but wife agreeable to intubation as needed-->will revisit  Family Communication: No family at bedside Disposition: We will change to stepdown status   Critical care: NA   Simonne Martinet ACNP-BC [facility] Pulmonary/Critical Care Pager # [phone] OR #  [phone] if no answer

## 2019-09-10 NOTE — Progress Notes (Signed)
Pharmacy Antibiotic Note  Lee [name] is a 69 y.o. male admitted on 09/04/2019 with acute hypoxic resptratory failure requiring intubation 8/27, recurrent UTI.  Pharmacy has been consulted for meropenem dosing. 09/10/2019  Day #4/7 Meropenem for ESBL E coli UTI Tm 101.2 8/31 WBC 11.4 9/1 Scr WNL  Plan: Continue Meropenem 1g IV q8h- stop date 9/5 PM for 7 days per CCM Pharmacy to sign off  Height: 5\' 11"  (180.3 cm) Weight: 52.1 kg (114 lb 13.8 oz) IBW/kg (Calculated) : 75.3  Temp (24hrs), Avg:100 F (37.8 C), Min:97.8 F (36.6 C), Max:101.2 F (38.4 C)  Recent Labs  Lab 09/04/19 2141 09/04/19 2141 09/05/19 0035 09/05/19 0637 09/06/19 0741 09/07/19 0319 09/08/19 0252 09/09/19 0243  WBC 8.8  --   --  10.0 9.0 9.8 11.4*  --   CREATININE 0.63   < >  --  0.48* 0.49* 0.43* 0.50* 0.53*  LATICACIDVEN 3.7*  --  2.4*  --   --   --   --   --    < > = values in this interval not displayed.    Estimated Creatinine Clearance: 63.3 mL/min (A) (by C-G formula based on SCr of 0.53 mg/dL (L)).    Allergies  Allergen Reactions  . Orange Juice [Orange Oil] Diarrhea  . Vicodin [Hydrocodone-Acetaminophen] Other (See Comments)    "triggered seizure both here and at home when he tried to take it" (01/22/2012)  . Penicillins Nausea And Vomiting    Tolerates Zosyn and Unasyn. Has patient had a PCN reaction causing immediate rash, facial/tongue/throat swelling, SOB or lightheadedness with hypotension: no Has patient had a PCN reaction causing severe rash involving mucus membranes or skin necrosis: no Has patient had a PCN reaction that required hospitalization: no Has patient had a PCN reaction occurring within the last 10 years: no If all of the above answers are "NO", then may proceed with Cephalosporin u  . Oxycodone     Causes Seizures  . Vicodin [Hydrocodone-Acetaminophen]     Antimicrobials this admission: Cefepime 8/27 x1   Vancomycin 8/27 >> 8/30 Zosyn 8/28 >>  8/30 Meropenem 8/30 >>  (9/5)  Dose adjustments this admission:  Microbiology results: 8/27 BCx x1: NGF 8/27 UCx: >100k ESBL E.coli F 8/28 Trach asp: moderate normal flora F    Thank you for allowing pharmacy to be a part of this patient's care.  9/28, Pharm.D 09/10/2019 10:48 AM

## 2019-09-10 NOTE — Evaluation (Signed)
SLP Cancellation Note  Patient Details Name: Lee [name] MRN: [id number] DOB: [birth year]   Cancelled treatment:       Reason Eval/Treat Not Completed: Other (comment) (Order received for swallow eval, pt extubated yesterday and pt has small bore tube for nutrition. Pt appeared lethargic with open mouth breathing earlier today, will see 9/3)  Rolena Infante, MS [facility] SLP Acute Rehab Services Office [phone]  Chales Abrahams 09/10/2019, 4:28 PM

## 2019-09-11 DIAGNOSIS — N390 Urinary tract infection, site not specified: Secondary | ICD-10-CM | POA: Diagnosis present

## 2019-09-11 DIAGNOSIS — A4151 Sepsis due to Escherichia coli [E. coli]: Principal | ICD-10-CM

## 2019-09-11 DIAGNOSIS — R531 Weakness: Secondary | ICD-10-CM

## 2019-09-11 DIAGNOSIS — G9340 Encephalopathy, unspecified: Secondary | ICD-10-CM

## 2019-09-11 DIAGNOSIS — G40909 Epilepsy, unspecified, not intractable, without status epilepticus: Secondary | ICD-10-CM

## 2019-09-11 DIAGNOSIS — Z515 Encounter for palliative care: Secondary | ICD-10-CM

## 2019-09-11 DIAGNOSIS — R627 Adult failure to thrive: Secondary | ICD-10-CM | POA: Diagnosis present

## 2019-09-11 DIAGNOSIS — N179 Acute kidney failure, unspecified: Secondary | ICD-10-CM

## 2019-09-11 DIAGNOSIS — F0390 Unspecified dementia without behavioral disturbance: Secondary | ICD-10-CM

## 2019-09-11 DIAGNOSIS — J690 Pneumonitis due to inhalation of food and vomit: Secondary | ICD-10-CM

## 2019-09-11 DIAGNOSIS — Z7189 Other specified counseling: Secondary | ICD-10-CM

## 2019-09-11 LAB — CBC WITH DIFFERENTIAL/PLATELET
Abs Immature Granulocytes: 0.06 10*3/uL (ref 0.00–0.07)
Basophils Absolute: 0 10*3/uL (ref 0.0–0.1)
Basophils Relative: 0 %
Eosinophils Absolute: 0.1 10*3/uL (ref 0.0–0.5)
Eosinophils Relative: 0 %
HCT: 39.1 % (ref 39.0–52.0)
Hemoglobin: 12.7 g/dL — ABNORMAL LOW (ref 13.0–17.0)
Immature Granulocytes: 1 %
Lymphocytes Relative: 16 %
Lymphs Abs: 2.1 10*3/uL (ref 0.7–4.0)
MCH: 31.6 pg (ref 26.0–34.0)
MCHC: 32.5 g/dL (ref 30.0–36.0)
MCV: 97.3 fL (ref 80.0–100.0)
Monocytes Absolute: 0.9 10*3/uL (ref 0.1–1.0)
Monocytes Relative: 7 %
Neutro Abs: 10 10*3/uL — ABNORMAL HIGH (ref 1.7–7.7)
Neutrophils Relative %: 76 %
Platelets: 545 10*3/uL — ABNORMAL HIGH (ref 150–400)
RBC: 4.02 MIL/uL — ABNORMAL LOW (ref 4.22–5.81)
RDW: 15.3 % (ref 11.5–15.5)
WBC: 13.1 10*3/uL — ABNORMAL HIGH (ref 4.0–10.5)
nRBC: 0 % (ref 0.0–0.2)

## 2019-09-11 LAB — BASIC METABOLIC PANEL
Anion gap: 10 (ref 5–15)
BUN: 12 mg/dL (ref 8–23)
CO2: 29 mmol/L (ref 22–32)
Calcium: 8.3 mg/dL — ABNORMAL LOW (ref 8.9–10.3)
Chloride: 99 mmol/L (ref 98–111)
Creatinine, Ser: 0.41 mg/dL — ABNORMAL LOW (ref 0.61–1.24)
GFR calc Af Amer: >60 mL/min (ref >60)
GFR calc non Af Amer: >60 mL/min (ref >60)
Glucose, Bld: 131 mg/dL — ABNORMAL HIGH (ref 70–99)
Potassium: 3.8 mmol/L (ref 3.5–5.1)
Sodium: 138 mmol/L (ref 135–145)

## 2019-09-11 LAB — GLUCOSE, CAPILLARY
Glucose-Capillary: 148 mg/dL — ABNORMAL HIGH (ref 70–99)
Glucose-Capillary: 125 mg/dL — ABNORMAL HIGH (ref 70–99)
Glucose-Capillary: 131 mg/dL — ABNORMAL HIGH (ref 70–99)
Glucose-Capillary: 134 mg/dL — ABNORMAL HIGH (ref 70–99)
Glucose-Capillary: 105 mg/dL — ABNORMAL HIGH (ref 70–99)

## 2019-09-11 LAB — PHOSPHORUS: Phosphorus: 2.8 mg/dL (ref 2.5–4.6)

## 2019-09-11 LAB — MAGNESIUM: Magnesium: 1.9 mg/dL (ref 1.7–2.4)

## 2019-09-11 MED ORDER — HYDRALAZINE HCL 50 MG PO TABS
50.0000 mg | ORAL_TABLET | Freq: Three times a day (TID) | ORAL | Status: DC
  Administered 2019-09-11 – 2019-09-14 (×11): 50 mg
  Filled 2019-09-11 (×10): qty 1

## 2019-09-11 MED ORDER — ACETAMINOPHEN 160 MG/5ML PO SOLN
650.0000 mg | ORAL | Status: DC | PRN
  Administered 2019-09-11 – 2019-09-14 (×3): 650 mg
  Filled 2019-09-11 (×4): qty 20.3

## 2019-09-11 MED ORDER — AMLODIPINE BESYLATE 5 MG PO TABS
5.0000 mg | ORAL_TABLET | Freq: Every day | ORAL | Status: DC
  Administered 2019-09-12 – 2019-09-14 (×3): 5 mg
  Filled 2019-09-11 (×4): qty 1

## 2019-09-11 MED ORDER — CLONAZEPAM 0.125 MG PO TBDP
0.2500 mg | ORAL_TABLET | Freq: Two times a day (BID) | ORAL | Status: DC
  Administered 2019-09-11 – 2019-09-14 (×7): 0.25 mg
  Filled 2019-09-11 (×8): qty 2

## 2019-09-11 NOTE — Progress Notes (Signed)
Chaplain providing support at nurse referral / pt request.   Pt spouse at bedside.  She shares decision-making process around pt's care.  Notes that pt in the past has told her to "do everything you can" but also she recognizes pt suffering and is working to discern how to care for pt.   Processes anger at illness and COVID - noting that Mr Hemme had declined significantly following quarantine. Her fiath is important in her decision making and coping process.  Shared prayers with chaplain for guidance.  She is cautious showing emotion at bedside, as Mr Sires responds to her.

## 2019-09-11 NOTE — Assessment & Plan Note (Addendum)
Patient presented with tachypnea, tachycardia, febrile, with acute concern for infection leading to criteria for sepsis.  Patient additionally meets criteria for severe sepsis given positive lactic acid and altered mental status on admission. -See sepsis and failure to thrive

## 2019-09-11 NOTE — Assessment & Plan Note (Addendum)
-   s/p intubation on 8/27, was given trial of extubation on 8/31. Remains at risk for further decompensation again - cautious monitoring - GOC discussed some with wife this am, 9/3.  She also continues discussions with palliative care and also requested to talk to the chaplain this morning  - on 9/4, wife has now elected for DNR and pursuing comfort care with residential hospice - d/c most aggressive measures; continue O2 for comfort but do not escalate above 6L

## 2019-09-11 NOTE — Assessment & Plan Note (Addendum)
-  Multiple bedside discussions held 9/3 individually with patient's wife including myself, chaplain, palliative care.  Review of last several years of patient's life and overall he has had ongoing progressive decline.  He is fully dependent on all ADLs and remains bedbound.  He resides at [facility] and has been hospitalized multiple times notably with aspiration pneumonia requiring intubation in the past as well.  We also discussed prevalence of his ESBL infections and concern for growing antibiotic resistance.  Given his poor prognosis and continued physical decline, his wife is now considering transitioning to hospice/comfort care.   - after further discussion on 9/4, wife is now elected for DNR and pursuing hospice/comfort care.  Residential hospice now in pursuit. - awaiting bed at [facility]

## 2019-09-11 NOTE — Assessment & Plan Note (Signed)
-  Per wife, patient is bedbound and dependent on all ADLs

## 2019-09-11 NOTE — Assessment & Plan Note (Addendum)
Patient presented with tachypnea, tachycardia, febrile, with acute concern for infection leading to criteria for sepsis.  Patient additionally meets criteria for severe sepsis given positive lactic acid and altered mental status on admission. Source considered urinary as urine culture growing ESBL E. Coli -Discontinue meropenem with transition to hospice/comfort care at this time

## 2019-09-11 NOTE — Assessment & Plan Note (Addendum)
-   SLP eval attempted on 9/2 but patient too lethargic -Repeat eval performed on 09/11/2019.  Still high risk for aspiration but has been advanced to dysphagia diet, level 1 (pure) - aspiration precautions  - NGT remains in place; wife still discussing with daughter to stop NGT - d/c meropenem and other aggressive measures in place as wife now pursuing hospice/comfort care

## 2019-09-11 NOTE — Progress Notes (Signed)
PROGRESS NOTE    Lee [name]   CWC:[id number]  DOB: [birth year]  DOA: 09/04/2019     6  PCP: Patient, No Pcp Per  CC: aspiration  Hospital Course: Lee [name] is a 69 yo AA male with PMH dementia, severe physical debility (bedbound), CVA, PVD, recurrent aspiration pna with respiratory failure requiring intubations who presented again in respiratory distress.  He was admitted to the ICU and intubated on 09/04/2019.  He was treated for recurrent aspiration pneumonia and urine cultures also grew ESBL E. coli.  Antibiotics were transitioned to meropenem.  He was able to be extubated on 09/08/19.  He was evaluated by SLP on 09/10/19 but was too lethargic for adequate evaluation.  He was reexamined on 09/11/19, and was advanced to dysphagia level 1 diet with still concern for high risk of recurrent aspiration.  Lee [name] met with palliative care and goals of care discussions were initiated.   Interval History:  [name] present bedside this morning.  Update given regarding status and questions answered.  Long discussion held bedside regarding Lee ongoing decline.  See plan below for discussion in detail.  Overall, she does accept that he is continuing to decline despite aggressive treatment especially over the last several months.  She is wishing to discuss plans further with the Lee [name] and then pursuing next steps regarding possible transition to comfort care.  Old records reviewed in assessment of this patient  ROS: Review of systems not obtained due to patient factors.  Unresponsive  Assessment & Plan: Urinary tract infection due to extended-spectrum beta lactamase (ESBL) producing Escherichia coli Patient presented with tachypnea, tachycardia, febrile, with acute concern for infection leading to criteria for sepsis.  Patient additionally meets criteria for severe sepsis given positive lactic acid and altered mental status on admission. - continue meropenem to complete 7  day course (end date 09/13/19)  Aspiration pneumonia of both lower lobes due to gastric secretions (HCC) - SLP eval attempted on 9/2 but patient too lethargic -Repeat eval performed on 09/11/2019.  Still high risk for aspiration but has been advanced to dysphagia diet, level 1 (pure) - aspiration precautions  -DC tube feeds but continue NG tube for now for ease with medication administration if needed as well as pending further [place] discussions with palliative care  Sepsis due to Escherichia coli (E. coli) ([place]) Patient presented with tachypnea, tachycardia, febrile, with acute concern for infection leading to criteria for sepsis.  Patient additionally meets criteria for severe sepsis given positive lactic acid and altered mental status on admission. Source considered urinary as urine culture growing ESBL E. coli - continue meropenem to complete 7 day course (end date 09/13/19)  Acute respiratory failure with hypoxia and hypercarbia (HCC) - s/p intubation on 8/27, was given trial of extubation on 8/31. Remains at risk for further decompensation again - cautious monitoring - GOC discussed some with [name] this am, 9/3.  She also continues discussions with palliative care and also requested to talk to the chaplain this morning -For now, patient remains partial code (no chest compressions/CPR), but would require intubation if decompensated again  Acute metabolic encephalopathy -Patient lying in bed minimally responsive and unable to arouse him to verbal or physical stimuli.  [name] present bedside.  Likely multifactorial in setting of underlying infection as well as ongoing malnourished state and declining physical status  AKI (acute kidney injury) ([place]) -Improved with fluids and tube feeds which have both been stopped.  Suspect this will again worsen as his nutritional  intake is not adequate enough to continue sustaining appropriate life and organ function -See failure to thrive  Dementia ([place]) -Per  [name], patient is bedbound and dependent on all ADLs  Protein-calorie malnutrition, severe - s/p TF, now discontinued -Able to pass swallow eval on 09/11/2019, continue dysphagia diet.  Suspect he will not be able to maintain this long  Seizure disorder (HCC) -Continue Klonopin, Vimpat, Keppra, valproic acid  Failure to thrive in adult -Multiple bedside discussions held this morning individually with Lee [name] including myself, chaplain, palliative care.  Review of last several years of Lee life and overall he has had ongoing progressive decline.  He is fully dependent on all ADLs and remains bedbound.  He resides at [facility] and has been hospitalized multiple times notably with aspiration pneumonia requiring intubation in the past as well.  We also discussed prevalence of his ESBL infections and concern for growing antibiotic resistance.  Given his poor prognosis and continued physical decline, his [name] is now considering transitioning to hospice/comfort care.  She wishes to discuss goals of care further with the Lee [name] before further decisions; remains partial code until they talk -Palliative care has also formally met with patient, greatly appreciate assistance.  Plan is to rediscuss [place] on 09/12/19 with further transitions to comfort/hospice as [name] and family decide   Antimicrobials: Vanc 8/27> 8/30 Cefepime 8/27>8/27 Zosyn 8/28>8/30 Meropenem (ESBL producer ecoli) 8/30>>>  DVT prophylaxis: Lovenox Code Status: Partial code (no CPR) Family Communication: [name] bedside Disposition Plan: Status is: Inpatient  Remains inpatient appropriate because:Altered mental status, Unsafe d/c plan, IV treatments appropriate due to intensity of illness or inability to take PO and Inpatient level of care appropriate due to severity of illness   Dispo: The patient is from: SNF              Anticipated d/c is to: Pending further discussions with palliative care               Anticipated d/c date is: 2 days              Patient currently is not medically stable to d/c.       Objective: Blood pressure (!) 112/36, pulse (!) 104, temperature 99.8 F (37.7 C), temperature source Axillary, resp. rate (!) 43, height _0  (1.803 m), weight 52.1 kg, SpO2 91 %.  Examination: General appearance: Chronically ill-appearing elderly man laying in bed with contracted upper and lower extremities laying in bed unresponsive and does not arouse to noxious stimuli Head: Normocephalic, without obvious abnormality Eyes: eomi Lungs: Diffuse coarse breath sounds bilaterally Heart: regular rate and rhythm and S1, S2 normal Abdomen: normal findings: bowel sounds normal and soft, non-tender Extremities: Thin, no edema appreciated Skin: dry and sunken Neurologic: Contracted laying in bed moving upper extremities spontaneously but unable to arouse or follow commands  Consultants:   Palliative care  Data Reviewed: I have personally reviewed following labs and imaging studies Results for orders placed or performed during the hospital encounter of 09/04/19 (from the past 24 hour(s))  Glucose, capillary     Status: Abnormal   Collection Time: 09/10/19  7:45 PM  Result Value Ref Range   Glucose-Capillary 128 (H) 70 - 99 mg/dL  Glucose, capillary     Status: Abnormal   Collection Time: 09/10/19 11:25 PM  Result Value Ref Range   Glucose-Capillary 141 (H) 70 - 99 mg/dL  Glucose, capillary     Status: Abnormal   Collection Time: 09/11/19  3:45 AM  Result Value Ref Range   Glucose-Capillary 148 (H) 70 - 99 mg/dL  Basic metabolic panel     Status: Abnormal   Collection Time: 09/11/19  8:04 AM  Result Value Ref Range   Sodium 138 135 - 145 mmol/L   Potassium 3.8 3.5 - 5.1 mmol/L   Chloride 99 98 - 111 mmol/L   CO2 29 22 - 32 mmol/L   Glucose, Bld 131 (H) 70 - 99 mg/dL   BUN 12 8 - 23 mg/dL   Creatinine, Ser 0.41 (L) 0.61 - 1.24 mg/dL   Calcium 8.3 (L) 8.9 - 10.3 mg/dL   GFR  calc non Af Amer >60 >60 mL/min   GFR calc Af Amer >60 >60 mL/min   Anion gap 10 5 - 15  CBC with Differential/Platelet     Status: Abnormal   Collection Time: 09/11/19  8:04 AM  Result Value Ref Range   WBC 13.1 (H) 4.0 - 10.5 K/uL   RBC 4.02 (L) 4.22 - 5.81 MIL/uL   Hemoglobin 12.7 (L) 13.0 - 17.0 g/dL   HCT 39.1 39 - 52 %   MCV 97.3 80.0 - 100.0 fL   MCH 31.6 26.0 - 34.0 pg   MCHC 32.5 30.0 - 36.0 g/dL   RDW 15.3 11.5 - 15.5 %   Platelets 545 (H) 150 - 400 K/uL   nRBC 0.0 0.0 - 0.2 %   Neutrophils Relative % 76 %   Neutro Abs 10.0 (H) 1.7 - 7.7 K/uL   Lymphocytes Relative 16 %   Lymphs Abs 2.1 0.7 - 4.0 K/uL   Monocytes Relative 7 %   Monocytes Absolute 0.9 0 - 1 K/uL   Eosinophils Relative 0 %   Eosinophils Absolute 0.1 0 - 0 K/uL   Basophils Relative 0 %   Basophils Absolute 0.0 0 - 0 K/uL   Immature Granulocytes 1 %   Abs Immature Granulocytes 0.06 0.00 - 0.07 K/uL  Magnesium     Status: None   Collection Time: 09/11/19  8:04 AM  Result Value Ref Range   Magnesium 1.9 1.7 - 2.4 mg/dL  Phosphorus     Status: None   Collection Time: 09/11/19  8:04 AM  Result Value Ref Range   Phosphorus 2.8 2.5 - 4.6 mg/dL  Glucose, capillary     Status: Abnormal   Collection Time: 09/11/19  8:40 AM  Result Value Ref Range   Glucose-Capillary 125 (H) 70 - 99 mg/dL   Comment 1 Notify RN    Comment 2 Document in Chart   Glucose, capillary     Status: Abnormal   Collection Time: 09/11/19 12:25 PM  Result Value Ref Range   Glucose-Capillary 131 (H) 70 - 99 mg/dL   Comment 1 Notify RN    Comment 2 Document in Chart     Recent Results (from the past 240 hour(s))  Blood Culture (routine x 2)     Status: None   Collection Time: 09/04/19  9:41 PM   Specimen: BLOOD RIGHT FOREARM  Result Value Ref Range Status   Specimen Description   Final    BLOOD RIGHT FOREARM Performed at [facility], [address] [place], [place] [address]    Special Requests   Final     BOTTLES DRAWN AEROBIC AND ANAEROBIC Blood Culture adequate volume Performed at [place] [address], [place], [place] [address]    Culture   Final    NO GROWTH 5 DAYS Performed at  [facility], [place] [address], [place], [place] [address]    Report Status 09/10/2019 FINAL  Final  SARS Coronavirus 2 by RT PCR (hospital order, performed in [facility] hospital lab) Nasopharyngeal Nasopharyngeal Swab     Status: None   Collection Time: 09/04/19 10:24 PM   Specimen: Nasopharyngeal Swab  Result Value Ref Range Status   SARS Coronavirus 2 NEGATIVE NEGATIVE Final    Comment: (NOTE) SARS-CoV-2 target nucleic acids are NOT DETECTED.  The SARS-CoV-2 RNA is generally detectable in upper and lower respiratory specimens during the acute phase of infection. The lowest concentration of SARS-CoV-2 viral copies this assay can detect is 250 copies / mL. A negative result does not preclude SARS-CoV-2 infection and should not be used as the sole basis for treatment or other patient management decisions.  A negative result may occur with improper specimen collection / handling, submission of specimen other than nasopharyngeal swab, presence of viral mutation(s) within the areas targeted by this assay, and inadequate number of viral copies (<250 copies / mL). A negative result must be combined with clinical observations, patient history, and epidemiological information.  Fact Sheet for Patients:   StrictlyIdeas.no  Fact Sheet for Healthcare Providers: BankingDealers.co.za  This test is not yet approved or  cleared by the [place] FDA and has been authorized for detection and/or diagnosis of SARS-CoV-2 by FDA under an Emergency Use Authorization (EUA).  This EUA will remain in effect (meaning this test can be used) for the duration of the COVID-19 declaration under Section 564(b)(1) of the Act, 21 U.S.C. section  360bbb-3(b)(1), unless the authorization is terminated or revoked sooner.  Performed at [facility], [place] [address], [place], [place] [address]   Urine culture     Status: Abnormal   Collection Time: 09/04/19 10:39 PM   Specimen: In/Out Cath Urine  Result Value Ref Range Status   Specimen Description   Final    IN/OUT CATH URINE Performed at [facility] [address], [place], [place] [address]    Special Requests   Final    NONE Performed at [facility], [place] [address], [place], [place] [address]    Culture (A)  Final    >=100,000 COLONIES/mL ESCHERICHIA COLI Confirmed Extended Spectrum Beta-Lactamase Producer (ESBL).  In bloodstream infections from ESBL organisms, carbapenems are preferred over piperacillin/tazobactam. They are shown to have a lower risk of mortality.    Report Status 09/07/2019 FINAL  Final   Organism ID, Bacteria ESCHERICHIA COLI (A)  Final      Susceptibility   Escherichia coli - MIC*    AMPICILLIN >=32 RESISTANT Resistant     CEFAZOLIN >=64 RESISTANT Resistant     CEFTRIAXONE >=64 RESISTANT Resistant     CIPROFLOXACIN 0.5 SENSITIVE Sensitive     GENTAMICIN >=16 RESISTANT Resistant     IMIPENEM <=0.25 SENSITIVE Sensitive     NITROFURANTOIN <=16 SENSITIVE Sensitive     TRIMETH/SULFA >=320 RESISTANT Resistant     AMPICILLIN/SULBACTAM >=32 RESISTANT Resistant     PIP/TAZO <=4 SENSITIVE Sensitive     * >=100,000 COLONIES/mL ESCHERICHIA COLI  Culture, respiratory (tracheal aspirate)     Status: None   Collection Time: 09/05/19  3:12 AM   Specimen: Tracheal Aspirate; Respiratory  Result Value Ref Range Status   Specimen Description   Final    TRACHEAL ASPIRATE Performed at [facility] [address], [place], [place] [address]    Special Requests  Final    NONE Performed at [facility], [place] [address], [place], [place] [address]    Gram Stain   Final     RARE WBC PRESENT,BOTH PMN AND MONONUCLEAR RARE GRAM POSITIVE COCCI IN PAIRS    Culture   Final    MODERATE Normal respiratory flora-no Staph aureus or Pseudomonas seen Performed at [facility], [address] [place], [place] [address]    Report Status 09/07/2019 FINAL  Final     Radiology Studies: DG Abd 1 View  Result Date: 09/10/2019 CLINICAL DATA:  Nasogastric tube placement EXAM: ABDOMEN - 1 VIEW COMPARISON:  Yesterday FINDINGS: Feeding tube with indwelling wire overlapping the stomach. Stool is seen over the bilateral flanks. Streaky density at the bases. No significant change from yesterday. IMPRESSION: The feeding tube tip is over the proximal stomach. Electronically Signed   By: Monte Fantasia M.D.   On: 09/10/2019 10:17   DG Abd 1 View  Result Date: 09/09/2019 CLINICAL DATA:  NG tube placement EXAM: ABDOMEN - 1 VIEW COMPARISON:  Radiograph 09/09/2019, 09/08/2019, chest radiograph 09/08/2019 FINDINGS: Radiopaque marker of the NG tube projects in the left upper quadrant just below the GE junction. This is minimally advanced from the comparison study performed 2 hours prior. Stiffening wire remains in place at the time of imaging. Telemetry leads overlie the chest. Stable streaky opacities present in the lung bases. Large colonic stool burden. No high-grade obstructive bowel gas pattern is seen however only the upper abdomen is included within the level of imaging. Extensive postsurgical changes are noted in the upper abdomen. These are similar to comparison. IMPRESSION: 1. Radiopaque marker of the NG tube projects in the left upper quadrant just below the GE junction. This is minimally advanced from the comparison study performed 2 hours prior. 2. Large colonic stool burden. 3. Stable opacities in the lung bases could reflect atelectasis and/or airspace disease. Electronically Signed   By: Lovena Le M.D.   On: 09/09/2019 20:33   DG Chest Port 1 View  Result Date:  09/10/2019 CLINICAL DATA:  Atelectasis. EXAM: PORTABLE CHEST 1 VIEW COMPARISON:  09/08/2019. FINDINGS: Interim extubation. Feeding tube noted with tip in the upper most portion of the stomach. Heart size stable. Bilateral interstitial infiltrates again noted without interim change. No pleural effusion or pneumothorax. IMPRESSION: 1. Interim extubation. Feeding tube noted with tip over the upper stomach. 2. Bilateral interstitial infiltrates again noted without interim change. Electronically Signed   By: Marcello Moores  Register   On: 09/10/2019 05:56   DG Abd Portable 1V  Result Date: 09/09/2019 CLINICAL DATA:  Feeding tube placement EXAM: PORTABLE ABDOMEN - 1 VIEW COMPARISON:  09/08/2019 FINDINGS: None nasoenteric feeding tube is in place with its tip just beyond the gastroesophageal junction. The majority of the abdomen is excluded from view. The visualized lung bases are clear. Lucency laterally along the left hemithorax relates to skin folds and is artifactual. IMPRESSION: Nasoenteric feeding tube just beyond the gastroesophageal junction. Advancement of the tube by 20-30 cm may be helpful to allow advancement into the duodenum. Electronically Signed   By: Fidela Salisbury MD   On: 09/09/2019 19:14   DG Abd 1 View  Final Result    DG Chest Port 1 View  Final Result    DG Abd 1 View  Final Result    DG Abd Portable 1V  Final Result    DG Abd 1 View  Final Result    DG Chest [place] 1  View  Final Result    DG CHEST PORT 1 VIEW  Final Result    DG Abdomen 1 View  Final Result    DG Chest Portable 1 View  Final Result    DG Chest Port 1 View  Final Result      Scheduled Meds: . [START ON 09/12/2019] amLODipine  5 mg Per Tube Daily  . aspirin  81 mg Per Tube Daily  . carBAMazepine  400 mg Per Tube TID  . chlorhexidine gluconate (MEDLINE KIT)  15 mL Mouth Rinse BID  . Chlorhexidine Gluconate Cloth  6 each Topical Daily  . clonazePAM  0.25 mg Per Tube BID  . collagenase   Topical Daily  .  docusate  100 mg Per Tube BID  . enoxaparin (LOVENOX) injection  40 mg Subcutaneous Q24H  . guaiFENesin  15 mL Per Tube Q4H  . hydrALAZINE  50 mg Per Tube Q8H  . ipratropium-albuterol  3 mL Nebulization Q6H  . lacosamide  300 mg Per Tube BID  . levETIRAcetam  1,500 mg Per Tube BID  . mouth rinse  15 mL Mouth Rinse 10 times per day  . pantoprazole sodium  40 mg Per Tube Daily  . polyethylene glycol  17 g Per Tube Daily  . polyvinyl alcohol  2 drop Left Eye BID  . simvastatin  10 mg Per Tube QHS  . valproic acid  750 mg Per Tube Q8H   PRN Meds: albuterol, docusate, polyethylene glycol Continuous Infusions: . meropenem (MERREM) IV Stopped (09/11/19 1419)      LOS: 6 days  Time spent: Greater than 50% of the 35 minute visit was spent in counseling/coordination of care for the patient as laid out in the A&P.   Dwyane Dee, MD [facility] 09/11/2019, 4:22 PM  Contact via secure chat.  To contact the attending provider between 7A-7P or the covering provider during after hours 7P-7A, please log into the web site [email or web address] and access using universal [place] password for that web site. If you do not have the password, please call the hospital operator.

## 2019-09-11 NOTE — Assessment & Plan Note (Addendum)
-  Patient lying in bed minimally responsive and unable to arouse him to verbal or physical stimuli.  Wife present bedside.  Likely multifactorial in setting of underlying infection as well as ongoing malnourished state and declining physical status -Patient now DNR -Okay for continuing pleasure feeds
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
## 2019-09-11 NOTE — Assessment & Plan Note (Addendum)
-  Continue Klonopin, Vimpat, Keppra, valproic acid -Likely will de-escalate some of these upon discharging to [facility] or other residential hospice

## 2019-09-11 NOTE — Assessment & Plan Note (Signed)
-   s/p TF, now discontinued -Able to pass swallow eval on 09/11/2019, continue dysphagia diet.  Suspect he will not be able to maintain this long

## 2019-09-11 NOTE — Hospital Course (Addendum)
Lee [name] is a 69 yo AA male with PMH dementia, severe physical debility (bedbound), CVA, PVD, recurrent aspiration pna with respiratory failure requiring intubations who presented again in respiratory distress.  He was admitted to the ICU and intubated on 09/04/2019.  He was treated for recurrent aspiration pneumonia and urine cultures also grew ESBL E. coli.  Antibiotics were transitioned to meropenem.  He was able to be extubated on 09/08/19.  He was evaluated by SLP on 09/10/19 but was too lethargic for adequate evaluation.  He was reexamined on 09/11/19, and was advanced to dysphagia level 1 diet with still concern for high risk of recurrent aspiration.  Patient's wife met with palliative care and goals of care discussions were initiated. On 09/12/2019, palliative care again met with patient's wife.  She elected to pursue full DNR at that time as well as pursuing placement in residential hospice.

## 2019-09-11 NOTE — Progress Notes (Signed)
   09/11/19 1721  Assess: MEWS Score  Temp (!) 101.6 F (38.7 C)  BP (!) 119/58  Pulse Rate 99  SpO2 92 %  O2 Device Room Air  Assess: if the MEWS score is Yellow or Red  Were vital signs taken at a resting state? Yes  Focused Assessment No change from prior assessment  Early Detection of Sepsis Score *See Row Information* High  MEWS guidelines implemented *See Row Information* Yes  Treat  MEWS Interventions Escalated (See documentation below)  Pain Scale PAINAD  Breathing 0  Negative Vocalization 0  Facial Expression 0  Body Language 0  Consolability 0  PAINAD Score 0  Take Vital Signs  Increase Vital Sign Frequency  Red: Q 1hr X 4 then Q 4hr X 4, if remains red, continue Q 4hrs  Escalate  MEWS: Escalate Red: discuss with charge nurse/RN and provider, consider discussing with RRT  Notify: Charge Nurse/RN  Name of Charge Nurse/RN Notified Tommy Medal  Date Charge Nurse/RN Notified 09/11/19  Time Charge Nurse/RN Notified 1758  Notify: Provider  Provider Name/Title Dr. Frederick Peers  Date Provider Notified 09/11/19  Time Provider Notified 1759  Notification Type Page  Notification Reason Other (Comment) (RED Mews)  Response No new orders  Date of Provider Response 09/11/19  Time of Provider Response 1800  Document  Patient Outcome Other (Comment) (RED MEWS)   Patient arrived to 1435 from ICU.  Patient RED MEWS Dr. Frederick Peers notified.  No new orders.  Will continue to monitor.

## 2019-09-11 NOTE — Assessment & Plan Note (Signed)
-  Improved with fluids and tube feeds which have both been stopped.  Suspect this will again worsen as his nutritional intake is not adequate enough to continue sustaining appropriate life and organ function -See failure to thrive

## 2019-09-12 DIAGNOSIS — G9341 Metabolic encephalopathy: Secondary | ICD-10-CM

## 2019-09-12 LAB — CBC WITH DIFFERENTIAL/PLATELET
Abs Immature Granulocytes: 0.1 10*3/uL — ABNORMAL HIGH (ref 0.00–0.07)
Basophils Absolute: 0.1 10*3/uL (ref 0.0–0.1)
Basophils Relative: 0 %
Eosinophils Absolute: 0 10*3/uL (ref 0.0–0.5)
Eosinophils Relative: 0 %
HCT: 38.7 % — ABNORMAL LOW (ref 39.0–52.0)
Hemoglobin: 12.7 g/dL — ABNORMAL LOW (ref 13.0–17.0)
Immature Granulocytes: 1 %
Lymphocytes Relative: 12 %
Lymphs Abs: 2 10*3/uL (ref 0.7–4.0)
MCH: 32.1 pg (ref 26.0–34.0)
MCHC: 32.8 g/dL (ref 30.0–36.0)
MCV: 97.7 fL (ref 80.0–100.0)
Monocytes Absolute: 0.8 10*3/uL (ref 0.1–1.0)
Monocytes Relative: 5 %
Neutro Abs: 13.3 10*3/uL — ABNORMAL HIGH (ref 1.7–7.7)
Neutrophils Relative %: 82 %
Platelets: 469 10*3/uL — ABNORMAL HIGH (ref 150–400)
RBC: 3.96 MIL/uL — ABNORMAL LOW (ref 4.22–5.81)
RDW: 15.3 % (ref 11.5–15.5)
WBC: 16.3 10*3/uL — ABNORMAL HIGH (ref 4.0–10.5)
nRBC: 0 % (ref 0.0–0.2)

## 2019-09-12 LAB — BASIC METABOLIC PANEL
Anion gap: 11 (ref 5–15)
BUN: 10 mg/dL (ref 8–23)
CO2: 27 mmol/L (ref 22–32)
Calcium: 7.5 mg/dL — ABNORMAL LOW (ref 8.9–10.3)
Chloride: 92 mmol/L — ABNORMAL LOW (ref 98–111)
Creatinine, Ser: 0.48 mg/dL — ABNORMAL LOW (ref 0.61–1.24)
GFR calc Af Amer: >60 mL/min (ref >60)
GFR calc non Af Amer: >60 mL/min (ref >60)
Glucose, Bld: 125 mg/dL — ABNORMAL HIGH (ref 70–99)
Potassium: 3.7 mmol/L (ref 3.5–5.1)
Sodium: 130 mmol/L — ABNORMAL LOW (ref 135–145)

## 2019-09-12 LAB — GLUCOSE, CAPILLARY
Glucose-Capillary: 106 mg/dL — ABNORMAL HIGH (ref 70–99)
Glucose-Capillary: 120 mg/dL — ABNORMAL HIGH (ref 70–99)
Glucose-Capillary: 123 mg/dL — ABNORMAL HIGH (ref 70–99)
Glucose-Capillary: 94 mg/dL (ref 70–99)

## 2019-09-12 LAB — MAGNESIUM: Magnesium: 1.7 mg/dL (ref 1.7–2.4)

## 2019-09-12 LAB — PHOSPHORUS: Phosphorus: 2.9 mg/dL (ref 2.5–4.6)

## 2019-09-12 NOTE — Progress Notes (Signed)
PROGRESS NOTE    Lee [name]   VOH:[id number]  DOB: March 17, 1950  DOA: 09/04/2019     7  PCP: Patient, No Pcp Per  CC: aspiration  Hospital Course: Lee [name] is a 69 yo AA male with PMH dementia, severe physical debility (bedbound), CVA, PVD, recurrent aspiration pna with respiratory failure requiring intubations who presented again in respiratory distress.  He was admitted to the ICU and intubated on 09/04/2019.  He was treated for recurrent aspiration pneumonia and urine cultures also grew ESBL E. coli.  Antibiotics were transitioned to meropenem.  He was able to be extubated on 09/08/19.  He was evaluated by SLP on 09/10/19 but was too lethargic for adequate evaluation.  He was reexamined on 09/11/19, and was advanced to dysphagia level 1 diet with still concern for high risk of recurrent aspiration.  Patient's wife met with palliative care and goals of care discussions were initiated. On 09/12/2019, palliative care again met with patient's wife.  She elected to pursue full DNR at that time as well as pursuing placement in residential hospice.   Interval History:  Wife not present this morning during my exam but she was present later on when palliative care was present.  She has elected for changing patient's status to DNR and pursuing comfort care/hospice.  TOC consult has been placed.  Starting to de-escalate medications. He is otherwise laying in bed contracted as usual and unable to interact or follow any significant commands.  Does not appear to be uncomfortable or in any pain but does grimace when abdomen is palpated or extremities are moved.  Old records reviewed in assessment of this patient  ROS: Review of systems not obtained due to patient factors.  Not interactive  Assessment & Plan: Urinary tract infection due to extended-spectrum beta lactamase (ESBL) producing Escherichia coli Patient presented with tachypnea, tachycardia, febrile, with acute concern for infection  leading to criteria for sepsis.  Patient additionally meets criteria for severe sepsis given positive lactic acid and altered mental status on admission. -See sepsis and failure to thrive  Aspiration pneumonia of both lower lobes due to gastric secretions (HCC) - SLP eval attempted on 9/2 but patient too lethargic -Repeat eval performed on 09/11/2019.  Still high risk for aspiration but has been advanced to dysphagia diet, level 1 (pure) - aspiration precautions  - NGT remains in place; wife still discussing with daughter to stop NGT - d/c meropenem and other aggressive measures in place as wife now pursuing hospice/comfort care  Sepsis due to Escherichia coli (E. coli) ([place]) Patient presented with tachypnea, tachycardia, febrile, with acute concern for infection leading to criteria for sepsis.  Patient additionally meets criteria for severe sepsis given positive lactic acid and altered mental status on admission. Source considered urinary as urine culture growing ESBL E. Coli -Discontinue meropenem with transition to hospice/comfort care at this time  Acute respiratory failure with hypoxia and hypercarbia ([place]) - s/p intubation on 8/27, was given trial of extubation on 8/31. Remains at risk for further decompensation again - cautious monitoring - GOC discussed some with wife this am, 9/3.  She also continues discussions with palliative care and also requested to talk to the chaplain this morning  - on 9/4, wife has now elected for DNR and pursuing comfort care with residential hospice - d/c most aggressive measures; continue O2 for comfort but do not escalate above 6L  Acute metabolic encephalopathy -Patient lying in bed minimally responsive and unable to arouse him to verbal  or physical stimuli.  Wife present bedside.  Likely multifactorial in setting of underlying infection as well as ongoing malnourished state and declining physical status -Patient now DNR -Okay for continuing pleasure  feeds  AKI (acute kidney injury) ([place]) -Improved with fluids and tube feeds which have both been stopped.  Suspect this will again worsen as his nutritional intake is not adequate enough to continue sustaining appropriate life and organ function -See failure to thrive  Dementia ([place]) -Per wife, patient is bedbound and dependent on all ADLs  Protein-calorie malnutrition, severe - s/p TF, now discontinued -Able to pass swallow eval on 09/11/2019, continue dysphagia diet.  Suspect he will not be able to maintain this long  Seizure disorder ([place]) -Continue Klonopin, Vimpat, Keppra, valproic acid -Likely will de-escalate some of these upon discharging to [facility] or other residential hospice  Failure to thrive in adult -Multiple bedside discussions held 9/3 individually with patient's wife including myself, chaplain, palliative care.  Review of last several years of patient's life and overall he has had ongoing progressive decline.  He is fully dependent on all ADLs and remains bedbound.  He resides at [facility] and has been hospitalized multiple times notably with aspiration pneumonia requiring intubation in the past as well.  We also discussed prevalence of his ESBL infections and concern for growing antibiotic resistance.  Given his poor prognosis and continued physical decline, his wife is now considering transitioning to hospice/comfort care.   - after further discussion on 9/4, wife is now elected for DNR and pursuing hospice/comfort care.  Residential hospice now in pursuit.   Antimicrobials: Vanc 8/27> 8/30 Cefepime 8/27>8/27 Zosyn 8/28>8/30 Meropenem (ESBL producer ecoli) 8/30 >>09/12/2019  DVT prophylaxis: Lovenox Code Status: DNR Family Communication: Wife bedside Disposition Plan: Status is: Inpatient  Remains inpatient appropriate because:Altered mental status, Unsafe d/c plan, IV treatments appropriate due to intensity of illness or inability to take PO and Inpatient level of  care appropriate due to severity of illness   Dispo: The patient is from: SNF              Anticipated d/c is to: Pending further discussions with palliative care              Anticipated d/c date is: 2 days              Patient currently is not medically stable to d/c.  Objective: Blood pressure (!) 176/91, pulse (!) 103, temperature 99.3 F (37.4 C), temperature source Oral, resp. rate (!) 27, height 5' 11" (1.803 m), weight 52.1 kg, SpO2 96 %.  Examination: General appearance: Chronically ill-appearing elderly man laying in bed with contracted upper and lower extremities.  Does not follow any commands Head: Normocephalic, without obvious abnormality Eyes: eomi Lungs: Diffuse coarse breath sounds bilaterally Heart: regular rate and rhythm and S1, S2 normal Abdomen: normal findings: bowel sounds normal and soft, non-tender Extremities: Thin, no edema appreciated Skin: dry and sunken Neurologic: Contracted laying in bed  Consultants:   Palliative care  Data Reviewed: I have personally reviewed following labs and imaging studies Results for orders placed or performed during the hospital encounter of 09/04/19 (from the past 24 hour(s))  Glucose, capillary     Status: Abnormal   Collection Time: 09/11/19  4:36 PM  Result Value Ref Range   Glucose-Capillary 134 (H) 70 - 99 mg/dL  Glucose, capillary     Status: Abnormal   Collection Time: 09/11/19  9:05 PM  Result Value Ref Range  Glucose-Capillary 105 (H) 70 - 99 mg/dL  Glucose, capillary     Status: Abnormal   Collection Time: 09/12/19 12:26 AM  Result Value Ref Range   Glucose-Capillary 123 (H) 70 - 99 mg/dL  Glucose, capillary     Status: Abnormal   Collection Time: 09/12/19  4:38 AM  Result Value Ref Range   Glucose-Capillary 120 (H) 70 - 99 mg/dL  Basic metabolic panel     Status: Abnormal   Collection Time: 09/12/19  5:11 AM  Result Value Ref Range   Sodium 130 (L) 135 - 145 mmol/L   Potassium 3.7 3.5 - 5.1 mmol/L    Chloride 92 (L) 98 - 111 mmol/L   CO2 27 22 - 32 mmol/L   Glucose, Bld 125 (H) 70 - 99 mg/dL   BUN 10 8 - 23 mg/dL   Creatinine, Ser 0.48 (L) 0.61 - 1.24 mg/dL   Calcium 7.5 (L) 8.9 - 10.3 mg/dL   GFR calc non Af Amer >60 >60 mL/min   GFR calc Af Amer >60 >60 mL/min   Anion gap 11 5 - 15  CBC with Differential/Platelet     Status: Abnormal   Collection Time: 09/12/19  5:11 AM  Result Value Ref Range   WBC 16.3 (H) 4.0 - 10.5 K/uL   RBC 3.96 (L) 4.22 - 5.81 MIL/uL   Hemoglobin 12.7 (L) 13.0 - 17.0 g/dL   HCT 38.7 (L) 39 - 52 %   MCV 97.7 80.0 - 100.0 fL   MCH 32.1 26.0 - 34.0 pg   MCHC 32.8 30.0 - 36.0 g/dL   RDW 15.3 11.5 - 15.5 %   Platelets 469 (H) 150 - 400 K/uL   nRBC 0.0 0.0 - 0.2 %   Neutrophils Relative % 82 %   Neutro Abs 13.3 (H) 1.7 - 7.7 K/uL   Lymphocytes Relative 12 %   Lymphs Abs 2.0 0.7 - 4.0 K/uL   Monocytes Relative 5 %   Monocytes Absolute 0.8 0 - 1 K/uL   Eosinophils Relative 0 %   Eosinophils Absolute 0.0 0 - 0 K/uL   Basophils Relative 0 %   Basophils Absolute 0.1 0 - 0 K/uL   Immature Granulocytes 1 %   Abs Immature Granulocytes 0.10 (H) 0.00 - 0.07 K/uL  Magnesium     Status: None   Collection Time: 09/12/19  5:11 AM  Result Value Ref Range   Magnesium 1.7 1.7 - 2.4 mg/dL  Phosphorus     Status: None   Collection Time: 09/12/19  5:11 AM  Result Value Ref Range   Phosphorus 2.9 2.5 - 4.6 mg/dL  Glucose, capillary     Status: None   Collection Time: 09/12/19  8:06 AM  Result Value Ref Range   Glucose-Capillary 94 70 - 99 mg/dL  Glucose, capillary     Status: Abnormal   Collection Time: 09/12/19 11:29 AM  Result Value Ref Range   Glucose-Capillary 106 (H) 70 - 99 mg/dL    Recent Results (from the past 240 hour(s))  Blood Culture (routine x 2)     Status: None   Collection Time: 09/04/19  9:41 PM   Specimen: BLOOD RIGHT FOREARM  Result Value Ref Range Status   Specimen Description   Final    BLOOD RIGHT FOREARM Performed at [facility], [place] [address], [place], [place] [address]    Special Requests   Final    BOTTLES DRAWN AEROBIC AND ANAEROBIC Blood Culture adequate volume Performed  at [facility], [place] [address] [place], [place] [address]    Culture   Final    NO GROWTH 5 DAYS Performed at [facility], [place] [address], [place], [place] [address]    Report Status 09/10/2019 FINAL  Final  SARS Coronavirus 2 by RT PCR (hospital order, performed in [facility] hospital lab) Nasopharyngeal Nasopharyngeal Swab     Status: None   Collection Time: 09/04/19 10:24 PM   Specimen: Nasopharyngeal Swab  Result Value Ref Range Status   SARS Coronavirus 2 NEGATIVE NEGATIVE Final    Comment: (NOTE) SARS-CoV-2 target nucleic acids are NOT DETECTED.  The SARS-CoV-2 RNA is generally detectable in upper and lower respiratory specimens during the acute phase of infection. The lowest concentration of SARS-CoV-2 viral copies this assay can detect is 250 copies / mL. A negative result does not preclude SARS-CoV-2 infection and should not be used as the sole basis for treatment or other patient management decisions.  A negative result may occur with improper specimen collection / handling, submission of specimen other than nasopharyngeal swab, presence of viral mutation(s) within the areas targeted by this assay, and inadequate number of viral copies (<250 copies / mL). A negative result must be combined with clinical observations, patient history, and epidemiological information.  Fact Sheet for Patients:   StrictlyIdeas.no  Fact Sheet for Healthcare Providers: BankingDealers.co.za  This test is not yet approved or  cleared by the [place] FDA and has been authorized for detection and/or diagnosis of SARS-CoV-2 by FDA under an Emergency Use Authorization (EUA).  This EUA will remain in effect (meaning this test can be used) for the  duration of the COVID-19 declaration under Section 564(b)(1) of the Act, 21 U.S.C. section 360bbb-3(b)(1), unless the authorization is terminated or revoked sooner.  Performed at [facility], [place] [address] [place], [place] [address]   Urine culture     Status: Abnormal   Collection Time: 09/04/19 10:39 PM   Specimen: In/Out Cath Urine  Result Value Ref Range Status   Specimen Description   Final    IN/OUT CATH URINE Performed at [place] [address] [place], [place] [address]    Special Requests   Final    NONE Performed at [facility], [place] [address] [place], [place] [address]    Culture (A)  Final    >=100,000 COLONIES/mL ESCHERICHIA COLI Confirmed Extended Spectrum Beta-Lactamase Producer (ESBL).  In bloodstream infections from ESBL organisms, carbapenems are preferred over piperacillin/tazobactam. They are shown to have a lower risk of mortality.    Report Status 09/07/2019 FINAL  Final   Organism ID, Bacteria ESCHERICHIA COLI (A)  Final      Susceptibility   Escherichia coli - MIC*    AMPICILLIN >=32 RESISTANT Resistant     CEFAZOLIN >=64 RESISTANT Resistant     CEFTRIAXONE >=64 RESISTANT Resistant     CIPROFLOXACIN 0.5 SENSITIVE Sensitive     GENTAMICIN >=16 RESISTANT Resistant     IMIPENEM <=0.25 SENSITIVE Sensitive     NITROFURANTOIN <=16 SENSITIVE Sensitive     TRIMETH/SULFA >=320 RESISTANT Resistant     AMPICILLIN/SULBACTAM >=32 RESISTANT Resistant     PIP/TAZO <=4 SENSITIVE Sensitive     * >=100,000 COLONIES/mL ESCHERICHIA COLI  Culture, respiratory (tracheal aspirate)     Status: None   Collection Time: 09/05/19  3:12 AM   Specimen: Tracheal Aspirate; Respiratory  Result Value Ref Range Status   Specimen Description  Final    TRACHEAL ASPIRATE Performed at [place] [address], [place], [place] [address]    Special Requests   Final    NONE Performed at [facility], [place] [address] [place], [place] [address]    Gram Stain   Final    RARE WBC PRESENT,BOTH PMN AND MONONUCLEAR RARE GRAM POSITIVE COCCI IN PAIRS    Culture   Final    MODERATE Normal respiratory flora-no Staph aureus or Pseudomonas seen Performed at [facility], [address] [place], [place] [address]    Report Status 09/07/2019 FINAL  Final     Radiology Studies: No results found. DG Abd 1 View  Final Result    DG Chest Port 1 View  Final Result    DG Abd 1 View  Final Result    DG Abd Portable 1V  Final Result    DG Abd 1 View  Final Result    DG Chest Port 1 View  Final Result    DG CHEST PORT 1 VIEW  Final Result    DG Abdomen 1 View  Final Result    DG Chest Portable 1 View  Final Result    DG Chest Port 1 View  Final Result      Scheduled Meds: . amLODipine  5 mg Per Tube Daily  . carBAMazepine  400 mg Per Tube TID  . chlorhexidine gluconate (MEDLINE KIT)  15 mL Mouth Rinse BID  . Chlorhexidine Gluconate Cloth  6 each Topical Daily  . clonazepam  0.25 mg Per Tube BID  . collagenase   Topical Daily  . docusate  100 mg Per Tube BID  . enoxaparin (LOVENOX) injection  40 mg Subcutaneous Q24H  . hydrALAZINE  50 mg Per Tube Q8H  . lacosamide  300 mg Per Tube BID  . levETIRAcetam  1,500 mg Per Tube BID  . mouth rinse  15 mL Mouth Rinse 10 times per day  . pantoprazole sodium  40 mg Per Tube Daily  . polyethylene glycol  17 g Per Tube Daily  . polyvinyl alcohol  2 drop Left Eye BID  . valproic acid  750 mg Per Tube Q8H   PRN Meds: acetaminophen (TYLENOL) oral liquid 160 mg/5 mL, albuterol, docusate, polyethylene glycol Continuous Infusions:     LOS: 7 days  Time spent: Greater than 50% of the 35 minute visit was spent in counseling/coordination of care for the patient as laid out in the A&P.   Dwyane Dee, MD [facility] 09/12/2019, 2:16 PM  Contact via secure chat.  To contact the attending  provider between 7A-7P or the covering provider during after hours 7P-7A, please log into the web site [email or web address] and access using universal [place] password for that web site. If you do not have the password, please call the hospital operator.

## 2019-09-12 NOTE — Progress Notes (Signed)
Daily Progress Note   Patient Name: Lee [name]       Date: 09/12/2019 DOB: 05/16/1950  Age: 69 y.o. MRN#: [id number] Attending Physician: Dwyane Dee, MD Primary Care Physician: Patient, No Pcp Per Admit Date: 09/04/2019  Reason for Consultation/Follow-up: Establishing goals of care  Subjective:  patient was more awake earlier this am, but was not tracking me in the room, was not verbalizing. I returned mid morning when the patient's wife Lee [name] arrived at bedside, we re discussed goals of care, see below.   Length of Stay: 7  Current Medications: Scheduled Meds:  . amLODipine  5 mg Per Tube Daily  . aspirin  81 mg Per Tube Daily  . carBAMazepine  400 mg Per Tube TID  . chlorhexidine gluconate (MEDLINE KIT)  15 mL Mouth Rinse BID  . Chlorhexidine Gluconate Cloth  6 each Topical Daily  . clonazepam  0.25 mg Per Tube BID  . collagenase   Topical Daily  . docusate  100 mg Per Tube BID  . enoxaparin (LOVENOX) injection  40 mg Subcutaneous Q24H  . guaiFENesin  15 mL Per Tube Q4H  . hydrALAZINE  50 mg Per Tube Q8H  . ipratropium-albuterol  3 mL Nebulization Q6H  . lacosamide  300 mg Per Tube BID  . levETIRAcetam  1,500 mg Per Tube BID  . mouth rinse  15 mL Mouth Rinse 10 times per day  . pantoprazole sodium  40 mg Per Tube Daily  . polyethylene glycol  17 g Per Tube Daily  . polyvinyl alcohol  2 drop Left Eye BID  . simvastatin  10 mg Per Tube QHS  . valproic acid  750 mg Per Tube Q8H    Continuous Infusions: . meropenem (MERREM) IV Stopped (09/12/19 0723)    PRN Meds: acetaminophen (TYLENOL) oral liquid 160 mg/5 mL, albuterol, docusate, polyethylene glycol  Physical Exam         Frail elderly appearing gentleman Contractures Wounds and dressing both heels Coarse  breath sounds s1 S 2 Not awake currently  Vital Signs: BP (!) 176/91 (BP Location: Left Arm)   Pulse (!) 103   Temp 99.3 F (37.4 C) (Oral)   Resp (!) 27   Ht _0  (1.803 m)   Wt 52.1 kg   SpO2 96%   BMI 16.02 kg/m  SpO2: SpO2: 96 % O2 Device: O2 Device: Room Air O2 Flow Rate: O2 Flow Rate (L/min): 1 L/min  Intake/output summary:   Intake/Output Summary (Last 24 hours) at 09/12/2019 1148 Last data filed at 09/12/2019 0739 Gross per 24 hour  Intake 1310 ml  Output 200 ml  Net 1110 ml   LBM: Last BM Date: 09/10/19 Baseline Weight: Weight: 61 kg (entering for RN for propofol processing ) Most recent weight: Weight: 52.1 kg       Palliative Assessment/Data:      Patient Active Problem List   Diagnosis Date Noted  . Aspiration pneumonia of both lower lobes due to gastric secretions ([place]) 09/11/2019  . Urinary tract infection due to extended-spectrum beta lactamase (ESBL) producing Escherichia coli 09/11/2019  . Sepsis due to Escherichia coli (E. coli) ([place]) 09/11/2019  . Failure to thrive in adult 09/11/2019  . Acute respiratory failure with hypoxia and hypercarbia ([place]) 09/05/2019  . Metabolic acidosis with increased anion gap and accumulation of organic acids 06/19/2019  . Lactic acidosis 06/19/2019  . AKI (acute kidney injury) ([place]) 06/19/2019  . Palliative care by specialist   . Goals of care, counseling/discussion   . DNR (do not resuscitate)   . Multifocal pneumonia 05/14/2019  . Sepsis ([place]) 03/29/2019  . Macrocytosis   . Protein-calorie malnutrition, severe 03/04/2019  . Acute respiratory failure with hypoxemia ([place]) 02/23/2019  . Pressure injury of skin 06/30/2018  . Acute on chronic respiratory failure with hypoxia ([place])   . Seizure disorder ([place]) 06/25/2018  . Seizure ([place]) 08/29/2016  . Leukocytosis 08/29/2016  . History of CVA (cerebrovascular accident) 08/29/2016  . Somnolence 08/29/2016  . Post-ictal state ([place]) 08/29/2016  . Lip laceration   .  Pain   . Leg weakness, bilateral   . Leg pain, bilateral 08/17/2016  . UTI (urinary tract infection) 08/15/2016  . Aggression 07/26/2016  . Dementia ([place]) 07/26/2016  . Elevated troponin 12/30/2015  . Chest pain 12/30/2015  . Aspiration pneumonia ([place]) 11/15/2013  . Status epilepticus ([place]) 11/14/2013  . Essential hypertension 11/14/2013  . Cerebral thrombosis with cerebral infarction ([place]) 11/07/2013  . Seizures ([place]) 11/06/2013  . Chronic airway obstruction, not elsewhere classified 09/22/2013  . Stroke ([place]) 09/22/2013  . Hyperlipidemia 09/18/2013  . Encephalopathy acute 09/11/2013  . Altered mental status 09/11/2013  . CVA (cerebral infarction) 09/11/2013  . Hyponatremia 09/11/2013  . Acute metabolic encephalopathy 39/76/7341  . Aftercare following surgery of the circulatory system, [place] 08/25/2012  . Foot swelling 02/04/2012  . Drainage from wound 01/04/2012  . Peripheral vascular disease ([place]) 11/05/2011  . Pain in limb 11/05/2011  . Chronic total occlusion of artery of the extremities ([place]) 11/05/2011  . PAD (peripheral artery disease) ([place]) 10/28/2011  . TOBACCO ABUSE 02/06/2007  . SYMPTOM, EDEMA 06/13/2006  . ERECTILE DYSFUNCTION 01/25/2006    Palliative Care Assessment & Plan   Patient Profile:    Assessment:  69 year old gentleman, known to palliative service seen in previous consultations, from [facility] facility where he has been for the past 4 years now.  Patient has past medical history significant for COPD, dementia, history of seizure disorder, hypertension, peripheral vascular disease, history of stroke.  Patient has a recent history of hospitalizations for aspiration, urinary tract infection, encephalopathy and also seizures.  Patient's wife Lee [name] is his next of kin.  Patient has been admitted to the hospital with acute hypoxic respiratory failure requiring intubation.  At the facility he was found to be 75% oxygen saturation on room  air.   Patient was intubated from 8-27 through 8-30.  He was extubated on 8-31.  He was found to have E. coli.  Patient has NG tube and was being given meds through the NG tube as well as tube feeds.  Palliative consultation for broad goals of care discussions, discussions pertaining to artificial nutrition and hydration.   Recommendations/Plan: Goals of care discussions were again undertaken with the patient's wife who arrived at the bedside.  We reviewed again about comfort measures only, we reviewed again about the type of care that can be given in a residential hospice setting.  We discussed about full DO NOT RESUSCITATE.  Discussed that the patient remains at high risk for ongoing decline and decompensation.  We discussed about etiology of aspiration and aspiration pneumonia in detail.  Discussed about stages of dementia and patient's ongoing decline.  Patient has physical debility which is very severe and is bedbound, has contractures, has previous history of stroke peripheral vascular disease.  Patient is a high risk for ongoing decline ongoing infections.  We discussed about residential hospice.  Patient's wife is most concerned about his seizure medications.  Explained that inside of hospice facility benzodiazepines are used for seizure management rather than regular antiepileptics.  Discussed about discontinuation of NG tube at some point.  She states that she has not had a chance to discuss this with the patient's 31 year old daughter.  She wishes to check in with her by phone today.  Meanwhile, she is agreeable to pursuing comfort measures and pursuing residential hospice at this time.  We rediscussed again about how PEG tubes are contraindicated in this case and at that its risks outweigh any benefits at this point.  Goals of Care and Additional Recommendations: Limitations on Scope of Treatment: Full Comfort Care  Code Status:    Code Status Orders  (From admission, onward)         Start      Ordered   09/12/19 1148  Do not attempt resuscitation (DNR)  Continuous       Question Answer Comment  In the event of cardiac or respiratory ARREST Do not call a "code blue"   In the event of cardiac or respiratory ARREST Do not perform Intubation, CPR, defibrillation or ACLS   In the event of cardiac or respiratory ARREST Use medication by any route, position, wound care, and other measures to relive pain and suffering. May use oxygen, suction and manual treatment of airway obstruction as needed for comfort.      09/12/19 1148        Code Status History    Date Active Date Inactive Code Status Order ID Comments User Context   09/05/2019 0157 09/12/2019 1147 Partial Code [id number]  Milagros Loll, MD ED   06/19/2019 1618 06/24/2019 2044 DNR [id number]  Norval Morton, MD ED   05/16/2019 0928 05/17/2019 2017 DNR [id number]  Rosezella Rumpf, NP Inpatient   05/14/2019 1653 05/16/2019 0928 Full Code [id number]  Lequita Halt, MD ED   03/29/2019 1604 04/03/2019 2230 Full Code [id number]  Mckinley Jewel, MD ED   02/23/2019 1824 03/06/2019 2308 Full Code [id number]  Johnsie Cancel, NP ED   06/25/2018 0844 07/06/2018 1635 Full Code [id number]  Cristal Generous, NP ED   03/22/2018 0725 03/26/2018 2128 Full Code [id number]  Kipp Brood, MD ED   08/29/2016 1944 08/31/2016 1622 Full Code [id number]  Mariel Aloe, MD Inpatient   08/15/2016 0407 08/21/2016 1951 Full Code  [id number]  Karmen Bongo, MD ED   07/25/2016 1601 07/26/2016 1305 Full Code [id number]  Varney Biles, MD ED   12/30/2015 1542 01/01/2016 1332 Full Code [id number]  Elwin Mocha, MD ED   11/14/2013 1648 11/20/2013 1416 Full Code [id number]  Kinnie Feil, MD Inpatient   11/06/2013 1342 11/09/2013 2144 Full Code [id number]  Hosie Poisson, MD Inpatient   09/11/2013 2355 09/16/2013 2026 Full Code [id number]  Rise Patience, MD Inpatient   01/22/2012 1452 01/29/2012 1826 Full Code [id number]  Elmore Guise, RN Inpatient   12/13/2011 1926  12/19/2011 1743 Full Code [id number]  Wells Guiles, RN Inpatient   Advance Care Planning Activity      Prognosis:  < 2 weeks  Discharge Planning: Hospice facility  Care plan was discussed with  Patient's wife Lee [name].   Thank you for allowing the Palliative Medicine Team to assist in the care of this patient.   Time In: 11 Time Out: 11.35 Total Time 35 Prolonged Time Billed No       Greater than 50%  of this time was spent counseling and coordinating care related to the above assessment and plan.  Loistine Chance, MD  Please contact Palliative Medicine Team phone at [phone] for questions and concerns.

## 2019-09-13 DIAGNOSIS — E43 Unspecified severe protein-calorie malnutrition: Secondary | ICD-10-CM

## 2019-09-13 DIAGNOSIS — R627 Adult failure to thrive: Secondary | ICD-10-CM

## 2019-09-13 LAB — GLUCOSE, CAPILLARY
Glucose-Capillary: 101 mg/dL — ABNORMAL HIGH (ref 70–99)
Glucose-Capillary: 120 mg/dL — ABNORMAL HIGH (ref 70–99)
Glucose-Capillary: 88 mg/dL (ref 70–99)
Glucose-Capillary: 96 mg/dL (ref 70–99)
Glucose-Capillary: 97 mg/dL (ref 70–99)

## 2019-09-13 NOTE — Progress Notes (Signed)
PROGRESS NOTE    Lee [name]   HBZ:[id number]  DOB: Nov 16, 1950  DOA: 09/04/2019     8  PCP: Patient, No Pcp Per  CC: aspiration  Hospital Course: Lee [name] is a 69 yo AA male with PMH dementia, severe physical debility (bedbound), CVA, PVD, recurrent aspiration pna with respiratory failure requiring intubations who presented again in respiratory distress.  He was admitted to the ICU and intubated on 09/04/2019.  He was treated for recurrent aspiration pneumonia and urine cultures also grew ESBL E. coli.  Antibiotics were transitioned to meropenem.  He was able to be extubated on 09/08/19.  He was evaluated by SLP on 09/10/19 but was too lethargic for adequate evaluation.  He was reexamined on 09/11/19, and was advanced to dysphagia level 1 diet with still concern for high risk of recurrent aspiration.  Patient's wife met with palliative care and goals of care discussions were initiated. On 09/12/2019, palliative care again met with patient's wife.  She elected to pursue full DNR at that time as well as pursuing placement in residential hospice.   Interval History:  Wife present this morning and updated bedside.  Reviewed the plan for comfort care/hospice.  She is still amenable to this but still has been processing all of the emotions and events of the past couple days.  She has talked to the patient's daughter.  They are both in agreement with continuing comfort care/hospice.  Old records reviewed in assessment of this patient  ROS: Review of systems not obtained due to patient factors.  Not interactive  Assessment & Plan: Urinary tract infection due to extended-spectrum beta lactamase (ESBL) producing Escherichia coli Patient presented with tachypnea, tachycardia, febrile, with acute concern for infection leading to criteria for sepsis.  Patient additionally meets criteria for severe sepsis given positive lactic acid and altered mental status on admission. -See sepsis and failure to  thrive  Aspiration pneumonia of both lower lobes due to gastric secretions (HCC) - SLP eval attempted on 9/2 but patient too lethargic -Repeat eval performed on 09/11/2019.  Still high risk for aspiration but has been advanced to dysphagia diet, level 1 (pure) - aspiration precautions  - NGT remains in place; wife still discussing with daughter to stop NGT - d/c meropenem and other aggressive measures in place as wife now pursuing hospice/comfort care  Sepsis due to Escherichia coli (E. coli) ([place]) Patient presented with tachypnea, tachycardia, febrile, with acute concern for infection leading to criteria for sepsis.  Patient additionally meets criteria for severe sepsis given positive lactic acid and altered mental status on admission. Source considered urinary as urine culture growing ESBL E. Coli -Discontinue meropenem with transition to hospice/comfort care at this time  Acute respiratory failure with hypoxia and hypercarbia ([place]) - s/p intubation on 8/27, was given trial of extubation on 8/31. Remains at risk for further decompensation again - cautious monitoring - GOC discussed some with wife this am, 9/3.  She also continues discussions with palliative care and also requested to talk to the chaplain this morning  - on 9/4, wife has now elected for DNR and pursuing comfort care with residential hospice - d/c most aggressive measures; continue O2 for comfort but do not escalate above 6L  Acute metabolic encephalopathy -Patient lying in bed minimally responsive and unable to arouse him to verbal or physical stimuli.  Wife present bedside.  Likely multifactorial in setting of underlying infection as well as ongoing malnourished state and declining physical status -Patient now DNR -Okay  for continuing pleasure feeds  AKI (acute kidney injury) ([place]) -Improved with fluids and tube feeds which have both been stopped.  Suspect this will again worsen as his nutritional intake is not adequate  enough to continue sustaining appropriate life and organ function -See failure to thrive  Dementia ([place]) -Per wife, patient is bedbound and dependent on all ADLs  Protein-calorie malnutrition, severe - s/p TF, now discontinued -Able to pass swallow eval on 09/11/2019, continue dysphagia diet.  Suspect he will not be able to maintain this long  Seizure disorder ([place]) -Continue Klonopin, Vimpat, Keppra, valproic acid -Likely will de-escalate some of these upon discharging to [facility] or other residential hospice  Failure to thrive in adult -Multiple bedside discussions held 9/3 individually with patient's wife including myself, chaplain, palliative care.  Review of last several years of patient's life and overall he has had ongoing progressive decline.  He is fully dependent on all ADLs and remains bedbound.  He resides at [facility] and has been hospitalized multiple times notably with aspiration pneumonia requiring intubation in the past as well.  We also discussed prevalence of his ESBL infections and concern for growing antibiotic resistance.  Given his poor prognosis and continued physical decline, his wife is now considering transitioning to hospice/comfort care.   - after further discussion on 9/4, wife is now elected for DNR and pursuing hospice/comfort care.  Residential hospice now in pursuit.   Antimicrobials: Vanc 8/27> 8/30 Cefepime 8/27>8/27 Zosyn 8/28>8/30 Meropenem (ESBL producer ecoli) 8/30 >>09/12/2019  DVT prophylaxis: Lovenox Code Status: DNR Family Communication: Wife bedside Disposition Plan: Status is: Inpatient  Remains inpatient appropriate because:Altered mental status, Unsafe d/c plan, IV treatments appropriate due to intensity of illness or inability to take PO and Inpatient level of care appropriate due to severity of illness   Dispo: The patient is from: SNF              Anticipated d/c is to: Pending further discussions with palliative care               Anticipated d/c date is: 2 days              Patient currently is not medically stable to d/c.  Objective: Blood pressure 135/84, pulse 95, temperature 99 F (37.2 C), resp. rate 20, height 5' 11" (1.803 m), weight 53.6 kg, SpO2 97 %.  Examination: General appearance: Chronically ill-appearing elderly man laying in bed with contracted upper and lower extremities.  Does not follow any commands Head: Normocephalic, without obvious abnormality Eyes: eomi Lungs: Diffuse coarse breath sounds bilaterally Heart: regular rate and rhythm and S1, S2 normal Abdomen: normal findings: bowel sounds normal and soft, non-tender Extremities: Thin, no edema appreciated Skin: dry and sunken Neurologic: Contracted laying in bed  Consultants:   Palliative care  Data Reviewed: I have personally reviewed following labs and imaging studies Results for orders placed or performed during the hospital encounter of 09/04/19 (from the past 24 hour(s))  Glucose, capillary     Status: None   Collection Time: 09/13/19  7:40 AM  Result Value Ref Range   Glucose-Capillary 88 70 - 99 mg/dL  Glucose, capillary     Status: None   Collection Time: 09/13/19 11:21 AM  Result Value Ref Range   Glucose-Capillary 97 70 - 99 mg/dL    Recent Results (from the past 240 hour(s))  Blood Culture (routine x 2)     Status: None   Collection Time: 09/04/19  9:41 PM  Specimen: BLOOD RIGHT FOREARM  Result Value Ref Range Status   Specimen Description   Final    BLOOD RIGHT FOREARM Performed at [facility] [address], [place], [place] [address]    Special Requests   Final    BOTTLES DRAWN AEROBIC AND ANAEROBIC Blood Culture adequate volume Performed at [facility] [address], [place], [place] [address]    Culture   Final    NO GROWTH 5 DAYS Performed at [facility], [place] [address], [place], [place] [address]    Report Status 09/10/2019 FINAL  Final  SARS  Coronavirus 2 by RT PCR (hospital order, performed in [facility] hospital lab) Nasopharyngeal Nasopharyngeal Swab     Status: None   Collection Time: 09/04/19 10:24 PM   Specimen: Nasopharyngeal Swab  Result Value Ref Range Status   SARS Coronavirus 2 NEGATIVE NEGATIVE Final    Comment: (NOTE) SARS-CoV-2 target nucleic acids are NOT DETECTED.  The SARS-CoV-2 RNA is generally detectable in upper and lower respiratory specimens during the acute phase of infection. The lowest concentration of SARS-CoV-2 viral copies this assay can detect is 250 copies / mL. A negative result does not preclude SARS-CoV-2 infection and should not be used as the sole basis for treatment or other patient management decisions.  A negative result may occur with improper specimen collection / handling, submission of specimen other than nasopharyngeal swab, presence of viral mutation(s) within the areas targeted by this assay, and inadequate number of viral copies (<250 copies / mL). A negative result must be combined with clinical observations, patient history, and epidemiological information.  Fact Sheet for Patients:   StrictlyIdeas.no  Fact Sheet for Healthcare Providers: BankingDealers.co.za  This test is not yet approved or  cleared by the [place] FDA and has been authorized for detection and/or diagnosis of SARS-CoV-2 by FDA under an Emergency Use Authorization (EUA).  This EUA will remain in effect (meaning this test can be used) for the duration of the COVID-19 declaration under Section 564(b)(1) of the Act, 21 U.S.C. section 360bbb-3(b)(1), unless the authorization is terminated or revoked sooner.  Performed at [facility], [place] [address], [place], [place] [address]   Urine culture     Status: Abnormal   Collection Time: 09/04/19 10:39 PM   Specimen: In/Out Cath Urine  Result Value Ref Range Status   Specimen Description    Final    IN/OUT CATH URINE Performed at [facility] [address], [place], [place] [address]    Special Requests   Final    NONE Performed at [facility], [place] [address] [place], [place] [address]    Culture (A)  Final    >=100,000 COLONIES/mL ESCHERICHIA COLI Confirmed Extended Spectrum Beta-Lactamase Producer (ESBL).  In bloodstream infections from ESBL organisms, carbapenems are preferred over piperacillin/tazobactam. They are shown to have a lower risk of mortality.    Report Status 09/07/2019 FINAL  Final   Organism ID, Bacteria ESCHERICHIA COLI (A)  Final      Susceptibility   Escherichia coli - MIC*    AMPICILLIN >=32 RESISTANT Resistant     CEFAZOLIN >=64 RESISTANT Resistant     CEFTRIAXONE >=64 RESISTANT Resistant     CIPROFLOXACIN 0.5 SENSITIVE Sensitive     GENTAMICIN >=16 RESISTANT Resistant     IMIPENEM <=0.25 SENSITIVE Sensitive     NITROFURANTOIN <=16 SENSITIVE Sensitive     TRIMETH/SULFA >=320 RESISTANT Resistant  AMPICILLIN/SULBACTAM >=32 RESISTANT Resistant     PIP/TAZO <=4 SENSITIVE Sensitive     * >=100,000 COLONIES/mL ESCHERICHIA COLI  Culture, respiratory (tracheal aspirate)     Status: None   Collection Time: 09/05/19  3:12 AM   Specimen: Tracheal Aspirate; Respiratory  Result Value Ref Range Status   Specimen Description   Final    TRACHEAL ASPIRATE Performed at [facility] [address], [place], [place] [address]    Special Requests   Final    NONE Performed at [facility], [place] [address] [place], [place] [address]    Gram Stain   Final    RARE WBC PRESENT,BOTH PMN AND MONONUCLEAR RARE GRAM POSITIVE COCCI IN PAIRS    Culture   Final    MODERATE Normal respiratory flora-no Staph aureus or Pseudomonas seen Performed at [facility], [address], [place], [place] [address]    Report Status 09/07/2019 FINAL  Final     Radiology Studies: No  results found. DG Abd 1 View  Final Result    DG Chest Port 1 View  Final Result    DG Abd 1 View  Final Result    DG Abd Portable 1V  Final Result    DG Abd 1 View  Final Result    DG Chest Port 1 View  Final Result    DG CHEST PORT 1 VIEW  Final Result    DG Abdomen 1 View  Final Result    DG Chest Portable 1 View  Final Result    DG Chest Port 1 View  Final Result      Scheduled Meds: . amLODipine  5 mg Per Tube Daily  . carBAMazepine  400 mg Per Tube TID  . chlorhexidine gluconate (MEDLINE KIT)  15 mL Mouth Rinse BID  . Chlorhexidine Gluconate Cloth  6 each Topical Daily  . clonazepam  0.25 mg Per Tube BID  . collagenase   Topical Daily  . docusate  100 mg Per Tube BID  . enoxaparin (LOVENOX) injection  40 mg Subcutaneous Q24H  . hydrALAZINE  50 mg Per Tube Q8H  . lacosamide  300 mg Per Tube BID  . levETIRAcetam  1,500 mg Per Tube BID  . mouth rinse  15 mL Mouth Rinse 10 times per day  . pantoprazole sodium  40 mg Per Tube Daily  . polyethylene glycol  17 g Per Tube Daily  . polyvinyl alcohol  2 drop Left Eye BID  . valproic acid  750 mg Per Tube Q8H   PRN Meds: acetaminophen (TYLENOL) oral liquid 160 mg/5 mL, albuterol, docusate, polyethylene glycol Continuous Infusions:     LOS: 8 days  Time spent: Greater than 50% of the 35 minute visit was spent in counseling/coordination of care for the patient as laid out in the A&P.   Dwyane Dee, MD [facility] 09/13/2019, 3:01 PM  Contact via secure chat.  To contact the attending provider between 7A-7P or the covering provider during after hours 7P-7A, please log into the web site [email or web address] and access using universal [place] password for that web site. If you do not have the password, please call the hospital operator.

## 2019-09-13 NOTE — Progress Notes (Signed)
Daily Progress Note   Patient Name: Lee [name]       Date: 09/13/2019 DOB: Apr 01, 1950  Age: 69 y.o. MRN#: [id number] Attending Physician: Dwyane Dee, MD Primary Care Physician: Patient, No Pcp Per Admit Date: 09/04/2019  Reason for Consultation/Follow-up: Establishing goals of care  Subjective:  continues to appear pale and lethargic.   No family at bedside, awaiting transfer to residential hospice, depending on bed availability.   Length of Stay: 8  Current Medications: Scheduled Meds:  . amLODipine  5 mg Per Tube Daily  . carBAMazepine  400 mg Per Tube TID  . chlorhexidine gluconate (MEDLINE KIT)  15 mL Mouth Rinse BID  . Chlorhexidine Gluconate Cloth  6 each Topical Daily  . clonazepam  0.25 mg Per Tube BID  . collagenase   Topical Daily  . docusate  100 mg Per Tube BID  . enoxaparin (LOVENOX) injection  40 mg Subcutaneous Q24H  . hydrALAZINE  50 mg Per Tube Q8H  . lacosamide  300 mg Per Tube BID  . levETIRAcetam  1,500 mg Per Tube BID  . mouth rinse  15 mL Mouth Rinse 10 times per day  . pantoprazole sodium  40 mg Per Tube Daily  . polyethylene glycol  17 g Per Tube Daily  . polyvinyl alcohol  2 drop Left Eye BID  . valproic acid  750 mg Per Tube Q8H    Continuous Infusions:   PRN Meds: acetaminophen (TYLENOL) oral liquid 160 mg/5 mL, albuterol, docusate, polyethylene glycol  Physical Exam         Frail elderly appearing gentleman Contractures Wounds and dressing both heels Coarse breath sounds s1 S 2 Not awake currently  Vital Signs: BP (!) 162/84 (BP Location: Left Arm)   Pulse 94   Temp 99.3 F (37.4 C) (Oral)   Resp (!) 30   Ht $R'5\' 11"'qE$  (1.803 m)   Wt 53.6 kg   SpO2 97% Comment: Oxigen taken from telemetry box  BMI 16.48 kg/m  SpO2: SpO2: 97 %  (Oxigen taken from telemetry box) O2 Device: O2 Device: Room Air O2 Flow Rate: O2 Flow Rate (L/min): 1 L/min  Intake/output summary:   Intake/Output Summary (Last 24 hours) at 09/13/2019 1251 Last data filed at 09/13/2019 1227 Gross per 24 hour  Intake --  Output 1000 ml  Net -1000 ml   LBM: Last BM Date: 09/10/19 Baseline Weight: Weight: 61 kg (entering for RN for propofol processing ) Most recent weight: Weight: 53.6 kg       Palliative Assessment/Data:      Patient Active Problem List   Diagnosis Date Noted  . Aspiration pneumonia of both lower lobes due to gastric secretions ([place]) 09/11/2019  . Urinary tract infection due to extended-spectrum beta lactamase (ESBL) producing Escherichia coli 09/11/2019  . Sepsis due to Escherichia coli (E. coli) ([place]) 09/11/2019  . Failure to thrive in adult 09/11/2019  . Acute respiratory failure with hypoxia and hypercarbia ([place]) 09/05/2019  . Metabolic acidosis with increased anion gap and accumulation of organic acids 06/19/2019  . Lactic acidosis 06/19/2019  . AKI (acute kidney injury) ([place]) 06/19/2019  . Palliative care by specialist   . Goals of care, counseling/discussion   . DNR (do not resuscitate)   . Multifocal pneumonia 05/14/2019  . Sepsis ([place]) 03/29/2019  . Macrocytosis   . Protein-calorie malnutrition, severe 03/04/2019  . Acute respiratory failure with hypoxemia ([place]) 02/23/2019  . Pressure injury of skin 06/30/2018  . Acute on chronic respiratory failure with hypoxia ([place])   . Seizure disorder ([place]) 06/25/2018  . Seizure ([place]) 08/29/2016  . Leukocytosis 08/29/2016  . History of CVA (cerebrovascular accident) 08/29/2016  . Somnolence 08/29/2016  . Post-ictal state ([place]) 08/29/2016  . Lip laceration   . Pain   . Leg weakness, bilateral   . Leg pain, bilateral 08/17/2016  . UTI (urinary tract infection) 08/15/2016  . Aggression 07/26/2016  . Dementia ([place]) 07/26/2016  . Elevated troponin 12/30/2015  . Chest  pain 12/30/2015  . Aspiration pneumonia ([place]) 11/15/2013  . Status epilepticus ([place]) 11/14/2013  . Essential hypertension 11/14/2013  . Cerebral thrombosis with cerebral infarction ([place]) 11/07/2013  . Seizures ([place]) 11/06/2013  . Chronic airway obstruction, not elsewhere classified 09/22/2013  . Stroke ([place]) 09/22/2013  . Hyperlipidemia 09/18/2013  . Encephalopathy acute 09/11/2013  . Altered mental status 09/11/2013  . CVA (cerebral infarction) 09/11/2013  . Hyponatremia 09/11/2013  . Acute metabolic encephalopathy 44/01/270  . Aftercare following surgery of the circulatory system, [place] 08/25/2012  . Foot swelling 02/04/2012  . Drainage from wound 01/04/2012  . Peripheral vascular disease ([place]) 11/05/2011  . Pain in limb 11/05/2011  . Chronic total occlusion of artery of the extremities ([place]) 11/05/2011  . PAD (peripheral artery disease) ([place]) 10/28/2011  . TOBACCO ABUSE 02/06/2007  . SYMPTOM, EDEMA 06/13/2006  . ERECTILE DYSFUNCTION 01/25/2006    Palliative Care Assessment & Plan   Patient Profile:    Assessment:  69 year old gentleman, known to palliative service seen in previous consultations, from [facility] facility where he has been for the past 4 years now.  Patient has past medical history significant for COPD, dementia, history of seizure disorder, hypertension, peripheral vascular disease, history of stroke.  Patient has a recent history of hospitalizations for aspiration, urinary tract infection, encephalopathy and also seizures.  Patient's wife [name] is his next of kin.  Patient has been admitted to the hospital with acute hypoxic respiratory failure requiring intubation.  At the facility he was found to be 75% oxygen saturation on room air.  Patient was intubated from 8-27 through 8-30.  He was extubated on 8-31.  He was found to have E. coli.  Patient has NG tube and was being given meds through the NG tube as well as tube feeds.  Palliative consultation  for broad goals of care  discussions, discussions pertaining to artificial nutrition and hydration.   Recommendations/Plan: Continue current mode of care Residential hospice when bed available.    Goals of Care and Additional Recommendations: Limitations on Scope of Treatment: Full Comfort Care  Code Status:    Code Status Orders  (From admission, onward)         Start     Ordered   09/12/19 1148  Do not attempt resuscitation (DNR)  Continuous       Question Answer Comment  In the event of cardiac or respiratory ARREST Do not call a "code blue"   In the event of cardiac or respiratory ARREST Do not perform Intubation, CPR, defibrillation or ACLS   In the event of cardiac or respiratory ARREST Use medication by any route, position, wound care, and other measures to relive pain and suffering. May use oxygen, suction and manual treatment of airway obstruction as needed for comfort.      09/12/19 1148        Code Status History    Date Active Date Inactive Code Status Order ID Comments User Context   09/05/2019 0157 09/12/2019 1147 Partial Code [id number]  Milagros Loll, MD ED   06/19/2019 1618 06/24/2019 2044 DNR [id number]  Norval Morton, MD ED   05/16/2019 0928 05/17/2019 2017 DNR [id number]  Rosezella Rumpf, NP Inpatient   05/14/2019 1653 05/16/2019 0928 Full Code [id number]  Lequita Halt, MD ED   03/29/2019 1604 04/03/2019 2230 Full Code [id number]  Mckinley Jewel, MD ED   02/23/2019 1824 03/06/2019 2308 Full Code [id number]  Johnsie Cancel, NP ED   06/25/2018 0844 07/06/2018 1635 Full Code [id number]  Cristal Generous, NP ED   03/22/2018 0725 03/26/2018 2128 Full Code [id number]  Kipp Brood, MD ED   08/29/2016 1944 08/31/2016 1622 Full Code [id number]  Mariel Aloe, MD Inpatient   08/15/2016 0407 08/21/2016 1951 Full Code [id number]  Karmen Bongo, MD ED   07/25/2016 1601 07/26/2016 1305 Full Code [id number]  Varney Biles, MD ED   12/30/2015 1542 01/01/2016 1332 Full Code [id number]   Elwin Mocha, MD ED   11/14/2013 1648 11/20/2013 1416 Full Code [id number]  Kinnie Feil, MD Inpatient   11/06/2013 1342 11/09/2013 2144 Full Code [id number]  Hosie Poisson, MD Inpatient   09/11/2013 2355 09/16/2013 2026 Full Code [id number]  Rise Patience, MD Inpatient   01/22/2012 1452 01/29/2012 1826 Full Code [id number]  Elmore Guise, RN Inpatient   12/13/2011 1926 12/19/2011 1743 Full Code [id number]  Wells Guiles, RN Inpatient   Advance Care Planning Activity      Prognosis:  < 2 weeks  Discharge Planning: Hospice facility  Care plan was discussed with  IDT  Thank you for allowing the Palliative Medicine Team to assist in the care of this patient.   Time In: 11 Time Out: 11.15 Total Time 15 Prolonged Time Billed No       Greater than 50%  of this time was spent counseling and coordinating care related to the above assessment and plan.  Loistine Chance, MD  Please contact Palliative Medicine Team phone at [phone] for questions and concerns.

## 2019-09-14 LAB — GLUCOSE, CAPILLARY
Glucose-Capillary: 117 mg/dL — ABNORMAL HIGH (ref 70–99)
Glucose-Capillary: 86 mg/dL (ref 70–99)
Glucose-Capillary: 120 mg/dL — ABNORMAL HIGH (ref 70–99)
Glucose-Capillary: 107 mg/dL — ABNORMAL HIGH (ref 70–99)
Glucose-Capillary: 90 mg/dL (ref 70–99)

## 2019-09-14 NOTE — Plan of Care (Signed)
  Problem: Education: Goal: Knowledge of General Education information will improve Description: Including pain rating scale, medication(s)/side effects and non-pharmacologic comfort measures Outcome: Progressing   Problem: Coping: Goal: Level of anxiety will decrease Outcome: Progressing   Problem: Pain Managment: Goal: General experience of comfort will improve Outcome: Progressing   

## 2019-09-14 NOTE — Progress Notes (Signed)
PMT no charge note.   Discussed with TOC colleague, awaiting residential hospice, no new PMT recommendations.   Rosalin Hawking MD [place] [facility].   [phone].

## 2019-09-14 NOTE — TOC Progression Note (Addendum)
Transition of Care [facility]) - Progression Note    Patient Details  Name: AMERICO VALLERY MRN: [id number] Date of Birth: August 04, 1950  Transition of Care [facility]) CM/SW Contact  Geni Bers, RN Phone Number: 09/14/2019, 11:22 AM  Clinical Narrative:    [facility] Care Referral line [phone] was called with no answer. A call was made to [facility] to confirm the referral line number. A return called revealed that [facility] is covering today. Spoke with [facility] [phone], referral was given. Spoke with wife at beside who agreed with pt going to [facility].    Expected Discharge Plan: Home/Self Care Barriers to Discharge: Continued Medical Work up  Expected Discharge Plan and Services Expected Discharge Plan: Home/Self Care   Discharge Planning Services: CM Consult   Living arrangements for the past 2 months: Single Family Home                                       Social Determinants of Health (SDOH) Interventions    Readmission Risk Interventions Readmission Risk Prevention Plan 06/22/2019  Transportation Screening Complete  PCP or Specialist Appt within 3-5 Days Complete  HRI or Home Care Consult Complete  Social Work Consult for Recovery Care Planning/Counseling Complete  Palliative Care Screening Complete  Medication Review Oceanographer) Referral to Pharmacy  Some recent data might be hidden

## 2019-09-14 NOTE — TOC Progression Note (Signed)
Transition of Care [facility]) - Progression Note    Patient Details  Name: DEVRON COHICK MRN: [id number] Date of Birth: 1950/05/13  [facility]) CM/SW Contact  Geni Bers, RN Phone Number: 09/14/2019, 12:29 PM  Clinical Narrative:    There are no [employer] available today. Will follow up in AM.    Expected Discharge Plan: Home/Self Care Barriers to Discharge: Continued Medical Work up  Expected Discharge Plan and Services Expected Discharge Plan: Home/Self Care   Discharge Planning Services: CM Consult   Living arrangements for the past 2 months: Single Family Home                                       Social Determinants of Health (SDOH) Interventions    Readmission Risk Interventions Readmission Risk Prevention Plan 06/22/2019  Transportation Screening Complete  PCP or Specialist Appt within 3-5 Days Complete  HRI or Home Care Consult Complete  Social Work Consult for Recovery Care Planning/Counseling Complete  Palliative Care Screening Complete  Medication Review Oceanographer) Referral to Pharmacy  Some recent data might be hidden

## 2019-09-14 NOTE — Progress Notes (Signed)
PROGRESS NOTE    MALIKE FOGLIO   OZD:[id number]  DOB: Oct 12, 1950  DOA: 09/04/2019     9  PCP: Patient, No Pcp Per  CC: aspiration  Hospital Course: [name] is a 69 yo AA male with PMH dementia, severe physical debility (bedbound), CVA, PVD, recurrent aspiration pna with respiratory failure requiring intubations who presented again in respiratory distress.  He was admitted to the ICU and intubated on 09/04/2019.  He was treated for recurrent aspiration pneumonia and urine cultures also grew ESBL E. coli.  Antibiotics were transitioned to meropenem.  He was able to be extubated on 09/08/19.  He was evaluated by SLP on 09/10/19 but was too lethargic for adequate evaluation.  He was reexamined on 09/11/19, and was advanced to dysphagia level 1 diet with still concern for high risk of recurrent aspiration.  Patient's wife met with palliative care and goals of care discussions were initiated. On 09/12/2019, palliative care again met with patient's wife.  She elected to pursue full DNR at that time as well as pursuing placement in residential hospice.   Interval History:  No events overnight. Awaiting bed at [facility] still. Just laying in bed staring but tracking with eyes as usual. Does not appear to be in any distress.   Old records reviewed in assessment of this patient  ROS: Review of systems not obtained due to patient factors.  Not interactive  Assessment & Plan: Urinary tract infection due to extended-spectrum beta lactamase (ESBL) producing Escherichia coli Patient presented with tachypnea, tachycardia, febrile, with acute concern for infection leading to criteria for sepsis.  Patient additionally meets criteria for severe sepsis given positive lactic acid and altered mental status on admission. -See sepsis and failure to thrive  Aspiration pneumonia of both lower lobes due to gastric secretions (HCC) - SLP eval attempted on 9/2 but patient too lethargic -Repeat eval performed  on 09/11/2019.  Still high risk for aspiration but has been advanced to dysphagia diet, level 1 (pure) - aspiration precautions  - NGT remains in place; wife still discussing with daughter to stop NGT - d/c meropenem and other aggressive measures in place as wife now pursuing hospice/comfort care  Sepsis due to Escherichia coli (E. coli) (HCC) Patient presented with tachypnea, tachycardia, febrile, with acute concern for infection leading to criteria for sepsis.  Patient additionally meets criteria for severe sepsis given positive lactic acid and altered mental status on admission. Source considered urinary as urine culture growing ESBL E. Coli -Discontinue meropenem with transition to hospice/comfort care at this time  Acute respiratory failure with hypoxia and hypercarbia (HCC) - s/p intubation on 8/27, was given trial of extubation on 8/31. Remains at risk for further decompensation again - cautious monitoring - GOC discussed some with wife this am, 9/3.  She also continues discussions with palliative care and also requested to talk to the chaplain this morning  - on 9/4, wife has now elected for DNR and pursuing comfort care with residential hospice - d/c most aggressive measures; continue O2 for comfort but do not escalate above 6L  Acute metabolic encephalopathy -Patient lying in bed minimally responsive and unable to arouse him to verbal or physical stimuli.  Wife present bedside.  Likely multifactorial in setting of underlying infection as well as ongoing malnourished state and declining physical status -Patient now DNR -Okay for continuing pleasure feeds  AKI (acute kidney injury) (HCC) -Improved with fluids and tube feeds which have both been stopped.  Suspect this will again  worsen as his nutritional intake is not adequate enough to continue sustaining appropriate life and organ function -See failure to thrive  Dementia ([place]) -Per wife, patient is bedbound and dependent on all  ADLs  Protein-calorie malnutrition, severe - s/p TF, now discontinued -Able to pass swallow eval on 09/11/2019, continue dysphagia diet.  Suspect he will not be able to maintain this long  Seizure disorder ([place]) -Continue Klonopin, Vimpat, Keppra, valproic acid -Likely will de-escalate some of these upon discharging to [facility] or other residential hospice  Failure to thrive in adult -Multiple bedside discussions held 9/3 individually with patient's wife including myself, chaplain, palliative care.  Review of last several years of patient's life and overall he has had ongoing progressive decline.  He is fully dependent on all ADLs and remains bedbound.  He resides at [facility] and has been hospitalized multiple times notably with aspiration pneumonia requiring intubation in the past as well.  We also discussed prevalence of his ESBL infections and concern for growing antibiotic resistance.  Given his poor prognosis and continued physical decline, his wife is now considering transitioning to hospice/comfort care.   - after further discussion on 9/4, wife is now elected for DNR and pursuing hospice/comfort care.  Residential hospice now in pursuit. - awaiting bed at [facility] 8/27> 8/30 Cefepime 8/27>8/27 Zosyn 8/28>8/30 Meropenem (ESBL producer ecoli) 8/30 >>09/12/2019  DVT prophylaxis: Lovenox Code Status: DNR Family Communication: Wife bedside Disposition Plan: Status is: Inpatient  Remains inpatient appropriate because:Altered mental status, Unsafe d/c plan, IV treatments appropriate due to intensity of illness or inability to take PO and Inpatient level of care appropriate due to severity of illness   Dispo: The patient is from: SNF              Anticipated d/c is to: Pending further discussions with palliative care              Anticipated d/c date is: 2 days              Patient currently is not medically stable to d/c.  Objective: Blood pressure (!)  171/91, pulse 92, temperature 100 F (37.8 C), temperature source Oral, resp. rate (!) 32, height $RemoveBe'5\' 11"'NgRhnsaOo$  (1.803 m), weight 53.1 kg, SpO2 94 %.  Examination: General appearance: Chronically ill-appearing elderly man laying in bed with contracted upper and lower extremities.  Does not follow any commands Head: Normocephalic, without obvious abnormality Eyes: eomi Lungs: Diffuse coarse breath sounds bilaterally Heart: regular rate and rhythm and S1, S2 normal Abdomen: normal findings: bowel sounds normal and soft, non-tender Extremities: Thin, no edema appreciated Skin: dry and sunken Neurologic: Contracted laying in bed  Consultants:   Palliative care  Data Reviewed: I have personally reviewed following labs and imaging studies Results for orders placed or performed during the hospital encounter of 09/04/19 (from the past 24 hour(s))  Glucose, capillary     Status: Abnormal   Collection Time: 09/13/19  4:18 PM  Result Value Ref Range   Glucose-Capillary 101 (H) 70 - 99 mg/dL  Glucose, capillary     Status: None   Collection Time: 09/13/19  9:10 PM  Result Value Ref Range   Glucose-Capillary 96 70 - 99 mg/dL  Glucose, capillary     Status: Abnormal   Collection Time: 09/13/19 11:39 PM  Result Value Ref Range   Glucose-Capillary 120 (H) 70 - 99 mg/dL  Glucose, capillary     Status: Abnormal   Collection Time: 09/14/19  3:59 AM  Result Value Ref Range   Glucose-Capillary 117 (H) 70 - 99 mg/dL  Glucose, capillary     Status: None   Collection Time: 09/14/19  7:59 AM  Result Value Ref Range   Glucose-Capillary 86 70 - 99 mg/dL  Glucose, capillary     Status: Abnormal   Collection Time: 09/14/19 11:56 AM  Result Value Ref Range   Glucose-Capillary 120 (H) 70 - 99 mg/dL    Recent Results (from the past 240 hour(s))  Blood Culture (routine x 2)     Status: None   Collection Time: 09/04/19  9:41 PM   Specimen: BLOOD RIGHT FOREARM  Result Value Ref Range Status   Specimen  Description   Final    BLOOD RIGHT FOREARM Performed at [facility] [address], [place], [place] [address]    Special Requests   Final    BOTTLES DRAWN AEROBIC AND ANAEROBIC Blood Culture adequate volume Performed at [facility] [address], [place], [place] [address]    Culture   Final    NO GROWTH 5 DAYS Performed at [facility], [place] [address] [place], [place] [address]    Report Status 09/10/2019 FINAL  Final  SARS Coronavirus 2 by RT PCR (hospital order, performed in [facility] hospital lab) Nasopharyngeal Nasopharyngeal Swab     Status: None   Collection Time: 09/04/19 10:24 PM   Specimen: Nasopharyngeal Swab  Result Value Ref Range Status   SARS Coronavirus 2 NEGATIVE NEGATIVE Final    Comment: (NOTE) SARS-CoV-2 target nucleic acids are NOT DETECTED.  The SARS-CoV-2 RNA is generally detectable in upper and lower respiratory specimens during the acute phase of infection. The lowest concentration of SARS-CoV-2 viral copies this assay can detect is 250 copies / mL. A negative result does not preclude SARS-CoV-2 infection and should not be used as the sole basis for treatment or other patient management decisions.  A negative result may occur with improper specimen collection / handling, submission of specimen other than nasopharyngeal swab, presence of viral mutation(s) within the areas targeted by this assay, and inadequate number of viral copies (<250 copies / mL). A negative result must be combined with clinical observations, patient history, and epidemiological information.  Fact Sheet for Patients:   StrictlyIdeas.no  Fact Sheet for Healthcare Providers: BankingDealers.co.za  This test is not yet approved or  cleared by the [place] FDA and has been authorized for detection and/or diagnosis of SARS-CoV-2 by FDA under an Emergency Use Authorization (EUA).  This  EUA will remain in effect (meaning this test can be used) for the duration of the COVID-19 declaration under Section 564(b)(1) of the Act, 21 U.S.C. section 360bbb-3(b)(1), unless the authorization is terminated or revoked sooner.  Performed at [facility], [place] [address], [place], [place] [address]   Urine culture     Status: Abnormal   Collection Time: 09/04/19 10:39 PM   Specimen: In/Out Cath Urine  Result Value Ref Range Status   Specimen Description   Final    IN/OUT CATH URINE Performed at [facility] [address], [place], [place] [address]    Special Requests   Final    NONE Performed at [facility], [place] [address], [place], [place] [address]    Culture (A)  Final    >=100,000 COLONIES/mL ESCHERICHIA COLI Confirmed Extended Spectrum Beta-Lactamase Producer (ESBL).  In bloodstream infections from ESBL organisms, carbapenems are preferred  over piperacillin/tazobactam. They are shown to have a lower risk of mortality.    Report Status 09/07/2019 FINAL  Final   Organism ID, Bacteria ESCHERICHIA COLI (A)  Final      Susceptibility   Escherichia coli - MIC*    AMPICILLIN >=32 RESISTANT Resistant     CEFAZOLIN >=64 RESISTANT Resistant     CEFTRIAXONE >=64 RESISTANT Resistant     CIPROFLOXACIN 0.5 SENSITIVE Sensitive     GENTAMICIN >=16 RESISTANT Resistant     IMIPENEM <=0.25 SENSITIVE Sensitive     NITROFURANTOIN <=16 SENSITIVE Sensitive     TRIMETH/SULFA >=320 RESISTANT Resistant     AMPICILLIN/SULBACTAM >=32 RESISTANT Resistant     PIP/TAZO <=4 SENSITIVE Sensitive     * >=100,000 COLONIES/mL ESCHERICHIA COLI  Culture, respiratory (tracheal aspirate)     Status: None   Collection Time: 09/05/19  3:12 AM   Specimen: Tracheal Aspirate; Respiratory  Result Value Ref Range Status   Specimen Description   Final    TRACHEAL ASPIRATE Performed at [facility] [address], [place],  [place] [address]    Special Requests   Final    NONE Performed at [facility], [place] [address] [place], [place] [address]    Gram Stain   Final    RARE WBC PRESENT,BOTH PMN AND MONONUCLEAR RARE GRAM POSITIVE COCCI IN PAIRS    Culture   Final    MODERATE Normal respiratory flora-no Staph aureus or Pseudomonas seen Performed at [facility], [address], [place], [place] [address]    Report Status 09/07/2019 FINAL  Final     Radiology Studies: No results found. DG Abd 1 View  Final Result    DG Chest Port 1 View  Final Result    DG Abd 1 View  Final Result    DG Abd Portable 1V  Final Result    DG Abd 1 View  Final Result    DG Chest Port 1 View  Final Result    DG CHEST PORT 1 VIEW  Final Result    DG Abdomen 1 View  Final Result    DG Chest Portable 1 View  Final Result    DG Chest Port 1 View  Final Result      Scheduled Meds: . amLODipine  5 mg Per Tube Daily  . carBAMazepine  400 mg Per Tube TID  . chlorhexidine gluconate (MEDLINE KIT)  15 mL Mouth Rinse BID  . Chlorhexidine Gluconate Cloth  6 each Topical Daily  . clonazepam  0.25 mg Per Tube BID  . collagenase   Topical Daily  . docusate  100 mg Per Tube BID  . enoxaparin (LOVENOX) injection  40 mg Subcutaneous Q24H  . hydrALAZINE  50 mg Per Tube Q8H  . lacosamide  300 mg Per Tube BID  . levETIRAcetam  1,500 mg Per Tube BID  . mouth rinse  15 mL Mouth Rinse 10 times per day  . pantoprazole sodium  40 mg Per Tube Daily  . polyethylene glycol  17 g Per Tube Daily  . polyvinyl alcohol  2 drop Left Eye BID  . valproic acid  750 mg Per Tube Q8H   PRN Meds: acetaminophen (TYLENOL) oral liquid 160 mg/5 mL, albuterol, docusate, polyethylene glycol Continuous Infusions:     LOS: 9 days  Time spent: Greater than 50% of the 35 minute visit was spent in counseling/coordination of care for the patient as laid out in the A&P.  Dwyane Dee, MD [facility] 09/14/2019,  12:42 PM  Contact via secure chat.  To contact the attending provider between 7A-7P or the covering provider during after hours 7P-7A, please log into the web site [email or web address] and access using universal [place] password for that web site. If you do not have the password, please call the hospital operator.

## 2019-09-14 NOTE — Progress Notes (Signed)
Civil engineer, contracting Documentation  Liaison received referral for pt to transfer to [facility] for EOL care. Writer called pt's wife to confirm interest with no answer. Left VM to return call to liaison.  [facility] has no bed availability today. Will update TOC and family tomorrow re: availability.   Please do not hesitate to outreach with any questions and thank you for the referral.   Trena Platt, RN  [facility] Liaison [phone]

## 2019-09-15 DIAGNOSIS — Z1612 Extended spectrum beta lactamase (ESBL) resistance: Secondary | ICD-10-CM

## 2019-09-15 DIAGNOSIS — R0602 Shortness of breath: Secondary | ICD-10-CM

## 2019-09-15 DIAGNOSIS — B9629 Other Escherichia coli [E. coli] as the cause of diseases classified elsewhere: Secondary | ICD-10-CM

## 2019-09-15 DIAGNOSIS — L89152 Pressure ulcer of sacral region, stage 2: Secondary | ICD-10-CM

## 2019-09-15 DIAGNOSIS — N390 Urinary tract infection, site not specified: Secondary | ICD-10-CM

## 2019-09-15 LAB — GLUCOSE, CAPILLARY
Glucose-Capillary: 109 mg/dL — ABNORMAL HIGH (ref 70–99)
Glucose-Capillary: 77 mg/dL (ref 70–99)
Glucose-Capillary: 94 mg/dL (ref 70–99)

## 2019-09-15 MED ORDER — LEVETIRACETAM 100 MG/ML PO SOLN: 1500.0000 mg | Freq: Two times a day (BID) | ORAL | 12 refills | Status: AC

## 2019-09-15 MED ORDER — COLLAGENASE 250 UNIT/GM EX OINT: TOPICAL_OINTMENT | Freq: Every day | CUTANEOUS | 0 refills | Status: AC

## 2019-09-15 MED ORDER — VALPROIC ACID 250 MG/5ML PO SOLN: 750.0000 mg | Freq: Three times a day (TID) | ORAL | 0 refills | Status: AC

## 2019-09-15 MED ORDER — CARBAMAZEPINE 100 MG/5ML PO SUSP: 400.0000 mg | Freq: Three times a day (TID) | ORAL | 12 refills | Status: AC

## 2019-09-15 MED ORDER — DOCUSATE SODIUM 50 MG/5ML PO LIQD: 100.0000 mg | Freq: Two times a day (BID) | ORAL | 0 refills | Status: AC

## 2019-09-15 MED ORDER — CLONAZEPAM 0.25 MG PO TBDP: 0.2500 mg | ORAL_TABLET | Freq: Two times a day (BID) | ORAL | 0 refills | Status: AC

## 2019-09-15 MED ORDER — ACETAMINOPHEN 160 MG/5ML PO SOLN: 650.0000 mg | ORAL | 0 refills | Status: AC | PRN

## 2019-09-15 MED ORDER — LACOSAMIDE 150 MG PO TABS: 300.0000 mg | ORAL_TABLET | Freq: Two times a day (BID) | ORAL | 0 refills | Status: AC

## 2019-09-15 NOTE — Progress Notes (Signed)
Civil engineer, contracting [facility]) Hospital Liaison note.   Received request from [facility] manager for family interest in [facility]. Chart reviewed and eligibility confirmed. Spoke with family to confirm interest and explain services. Family agreeable to transfer today. TOC aware.    [facility] will notify TOC when registration paperwork has been completed to arrange transport.   RN please call report to [phone].  Please be sure the DNR form transports with the patient.   Thank you for the opportunity to participate in this patient's care.  Chrislyn Brooke Dare, BSN, RN [facility] Liaison (listed on AMION under Hospice[facility])    [phone]

## 2019-09-15 NOTE — TOC Progression Note (Signed)
Transition of Care [facility]) - Progression Note    Patient Details  Name: DIRON HADDON MRN: [id number] Date of Birth: 26-Aug-1950  [facility]) CM/SW Contact  Geni Bers, RN Phone Number: 09/15/2019, 12:42 PM  Clinical Narrative:     Sharin Mons was called for 1:30 PM transport to [facility]. Pt's wife was called to give update. Pt's RN is aware.   Expected Discharge Plan: Home/Self Care Barriers to Discharge: Continued Medical Work up  Expected Discharge Plan and Services Expected Discharge Plan: Home/Self Care   Discharge Planning Services: CM Consult   Living arrangements for the past 2 months: Single Family Home Expected Discharge Date: 09/15/19                                     Social Determinants of Health (SDOH) Interventions    Readmission Risk Interventions Readmission Risk Prevention Plan 06/22/2019  Transportation Screening Complete  PCP or Specialist Appt within 3-5 Days Complete  HRI or Home Care Consult Complete  Social Work Consult for Recovery Care Planning/Counseling Complete  Palliative Care Screening Complete  Medication Review [employer]) Referral to Pharmacy  Some recent data might be hidden

## 2019-09-15 NOTE — Discharge Summary (Signed)
Physician Discharge Summary  Lee [name] WGN:[id number] DOB: [birth year] DOA: 09/04/2019  PCP: Patient, No Pcp Per  Admit date: 09/04/2019 Discharge date: 09/15/2019  Recommendations for Outpatient Follow-up:  1. Discharge to [facility] place for end of life care.  Discharge Diagnoses: Principal diagnosis is #1 1. Sepsis due to ESBL E. Coli, and aspiration pneumonia 2. Acute hypercarbic and hypoxic respiratory failure 3. AKI 4. Severe Protein Calorie Malnutrition 5. Seizure disorder 6. Dementia 7. Failure to thrive  Discharge Condition: Serious  Disposition: [facility]  Diet recommendation: Pleasure feeds  Filed Weights   09/09/19 0500 09/13/19 0500 09/14/19 0500  Weight: 52.1 kg 53.6 kg 53.1 kg    History of present illness:  69 year old man with history of dementia, reported COPD, seizure disorder, hypertension, PVD, CVA, aspiration, recurrent UTI presenting from [facility] with dyspnea found to have acute hypoxic respiratory failure.  At his facility he was found to be 75% on room air.  EMS placed him on nonrebreather.  Unable to obtain history from patient due to intubation/acute encephalopathy.  History obtained from EMR and ED providers.  He was recently hospitalized from 6/11 to 6/16 after multiple episodes of seizure.  He was treated for pneumonia as well as ESBL E. coli UTI.  He was intubated. He was found to have a fever to 100.6.  Normotensive.  O2 sat improved to 100%.  He received vancomycin and cefepime, 1 L of normal saline.  Hospital Course:  Mr. Dillin is a 69 yo AA male with PMH dementia, severe physical debility (bedbound), CVA, PVD, recurrent aspiration pna with respiratory failure requiring intubations who presented again in respiratory distress.  He was admitted to the ICU and intubated on 09/04/2019.  He was treated for recurrent aspiration pneumonia and urine cultures also grew ESBL E. coli.  Antibiotics were transitioned to  meropenem.  He was able to be extubated on 09/08/19.  He was evaluated by SLP on 09/10/19 but was too lethargic for adequate evaluation.  He was reexamined on 09/11/19, and was advanced to dysphagia level 1 diet with still concern for high risk of recurrent aspiration.  Patient's wife met with palliative care and goals of care discussions were initiated. On 09/12/2019, palliative care again met with patient's wife.  She elected to pursue full DNR at that time as well as pursuing placement in residential hospice. The patient will discharge to [facility] today for end of life care.  Today's assessment: S: The patient is lying in bed. At the moment he is somewhat uncomfortable and agitated due to his recently being cleaned up by nursing. No new complaints. O: Vitals:  Vitals:   09/15/19 0619 09/15/19 1120  BP: (!) 155/80 (!) 163/83  Pulse: 90 89  Resp: 16 20  Temp: 97.8 F (36.6 C) 98.6 F (37 C)  SpO2: 95% 94%    Exam:  Constitutional:  . The patient is awake, alert, and oriented x 3. Mild distress. He appears cachectic, weak, and frail. Respiratory:  . No increased work of breathing. . No wheezes, rales, or rhonchi . No tactile fremitus Cardiovascular:  . Regular rate and rhythm . No murmurs, ectopy, or gallups. . No lateral PMI. No thrills. Abdomen:  . Abdomen is soft, non-tender, non-distended . No hernias, masses, or organomegaly . Normoactive bowel sounds.  Musculoskeletal:  . No cyanosis, clubbing, or edema Skin:  . No rashes, lesions, ulcers . palpation of skin: no induration or nodules Neurologic:  . CN 2-12 intact . Sensation  all 4 extremities intact Psychiatric:  . Mental status o Mood, affect appropriate o Orientation to person, place, time  . judgment and insight appear intact    Discharge Instructions  Discharge Instructions    Activity as tolerated - No restrictions   Complete by: As directed    Call MD for:  severe uncontrolled pain   Complete by: As  directed    Diet - low sodium heart healthy   Complete by: As directed    Discharge instructions   Complete by: As directed    Discharge to [facility] place with NGT for end of life care.   Discharge wound care:   Complete by: As directed    As above.   Increase activity slowly   Complete by: As directed      Allergies as of 09/15/2019      Reactions   Orange Juice [orange Oil] Diarrhea   Vicodin [hydrocodone-acetaminophen] Other (See Comments)   "triggered seizure both here and at home when he tried to take it" (01/22/2012)   Penicillins Nausea And Vomiting   Tolerates Zosyn and Unasyn. Has patient had a PCN reaction causing immediate rash, facial/tongue/throat swelling, SOB or lightheadedness with hypotension: no Has patient had a PCN reaction causing severe rash involving mucus membranes or skin necrosis: no Has patient had a PCN reaction that required hospitalization: no Has patient had a PCN reaction occurring within the last 10 years: no If all of the above answers are "NO", then may proceed with Cephalosporin u   Oxycodone    Causes Seizures   Vicodin [hydrocodone-acetaminophen]       Medication List    STOP taking these medications   acetaminophen 325 MG tablet Commonly known as: TYLENOL Replaced by: acetaminophen 160 MG/5ML solution   acetaminophen 500 MG tablet Commonly known as: TYLENOL   albuterol 108 (90 Base) MCG/ACT inhaler Commonly known as: VENTOLIN HFA   amLODipine 10 MG tablet Commonly known as: NORVASC   ascorbic acid 500 MG tablet Commonly known as: VITAMIN C   aspirin 81 MG chewable tablet   B-complex with vitamin C tablet   cholecalciferol 1000 units tablet Commonly known as: VITAMIN D   clonazePAM 0.5 MG tablet Commonly known as: KLONOPIN Replaced by: clonazePAM 0.25 MG disintegrating tablet   cloNIDine 0.1 mg/24hr patch Commonly known as: CATAPRES - Dosed in mg/24 hr   cyclobenzaprine 5 MG tablet Commonly known as: FLEXERIL    hydrALAZINE 25 MG tablet Commonly known as: APRESOLINE   hydrALAZINE 50 MG tablet Commonly known as: APRESOLINE   isosorbide mononitrate 30 MG 24 hr tablet Commonly known as: IMDUR   magnesium oxide 400 MG tablet Commonly known as: MAG-OX   multivitamin with minerals Tabs tablet   NON FORMULARY   nystatin 100000 UNIT/ML suspension Commonly known as: MYCOSTATIN   omeprazole 20 MG tablet Commonly known as: PRILOSEC OTC   saccharomyces boulardii 250 MG capsule Commonly known as: FLORASTOR   sennosides-docusate sodium 8.6-50 MG tablet Commonly known as: SENOKOT-S   simvastatin 10 MG tablet Commonly known as: ZOCOR   Zinc 50 MG Tabs     TAKE these medications   acetaminophen 160 MG/5ML solution Commonly known as: TYLENOL Place 20.3 mLs (650 mg total) into feeding tube every 4 (four) hours as needed for fever (fever>101). Replaces: acetaminophen 325 MG tablet   ARTIFICIAL TEARS PF OP Place 2 drops into the left eye in the morning and at bedtime.   carBAMazepine 100 MG/5ML suspension Commonly known as: TEGRETOL Place  20 mLs (400 mg total) into feeding tube 3 (three) times daily. What changed:   how much to take  how to take this  when to take this   clonazePAM 0.25 MG disintegrating tablet Commonly known as: KLONOPIN Place 1 tablet (0.25 mg total) into feeding tube 2 (two) times daily. Replaces: clonazePAM 0.5 MG tablet   collagenase ointment Commonly known as: SANTYL Apply topically daily.   docusate 50 MG/5ML liquid Commonly known as: COLACE Place 10 mLs (100 mg total) into feeding tube 2 (two) times daily.   Lacosamide 150 MG Tabs Place 2 tablets (300 mg total) into feeding tube 2 (two) times daily. What changed:   how to take this  when to take this   levETIRAcetam 100 MG/ML solution Commonly known as: KEPPRA Place 15 mLs (1,500 mg total) into feeding tube 2 (two) times daily. What changed: how to take this   polyethylene glycol 17 g  packet Commonly known as: MIRALAX / GLYCOLAX Take 17 g by mouth at bedtime. Take 17 grams mix with 8OZ of apple juice for constipation.   promethazine 12.5 MG suppository Commonly known as: PHENERGAN Place 12.5 mg rectally every 8 (eight) hours as needed for nausea or vomiting (hold for sedation).   valproic acid 250 MG/5ML solution Commonly known as: DEPAKENE Place 15 mLs (750 mg total) into feeding tube every 8 (eight) hours. What changed:   how to take this  when to take this            Discharge Care Instructions  (From admission, onward)         Start     Ordered   09/15/19 0000  Discharge wound care:       Comments: As above.   09/15/19 1205         Allergies  Allergen Reactions  . Orange Juice [Orange Oil] Diarrhea  . Vicodin [Hydrocodone-Acetaminophen] Other (See Comments)    "triggered seizure both here and at home when he tried to take it" (01/22/2012)  . Penicillins Nausea And Vomiting    Tolerates Zosyn and Unasyn. Has patient had a PCN reaction causing immediate rash, facial/tongue/throat swelling, SOB or lightheadedness with hypotension: no Has patient had a PCN reaction causing severe rash involving mucus membranes or skin necrosis: no Has patient had a PCN reaction that required hospitalization: no Has patient had a PCN reaction occurring within the last 10 years: no If all of the above answers are "NO", then may proceed with Cephalosporin u  . Oxycodone     Causes Seizures  . Vicodin [Hydrocodone-Acetaminophen]     The results of significant diagnostics from this hospitalization (including imaging, microbiology, ancillary and laboratory) are listed below for reference.    Significant Diagnostic Studies: DG Abd 1 View  Result Date: 09/10/2019 CLINICAL DATA:  Nasogastric tube placement EXAM: ABDOMEN - 1 VIEW COMPARISON:  Yesterday FINDINGS: Feeding tube with indwelling wire overlapping the stomach. Stool is seen over the bilateral flanks. Streaky  density at the bases. No significant change from yesterday. IMPRESSION: The feeding tube tip is over the proximal stomach. Electronically Signed   By: Marnee Spring M.D.   On: 09/10/2019 10:17   DG Abd 1 View  Result Date: 09/09/2019 CLINICAL DATA:  NG tube placement EXAM: ABDOMEN - 1 VIEW COMPARISON:  Radiograph 09/09/2019, 09/08/2019, chest radiograph 09/08/2019 FINDINGS: Radiopaque marker of the NG tube projects in the left upper quadrant just below the GE junction. This is minimally advanced from the comparison study performed  2 hours prior. Stiffening wire remains in place at the time of imaging. Telemetry leads overlie the chest. Stable streaky opacities present in the lung bases. Large colonic stool burden. No high-grade obstructive bowel gas pattern is seen however only the upper abdomen is included within the level of imaging. Extensive postsurgical changes are noted in the upper abdomen. These are similar to comparison. IMPRESSION: 1. Radiopaque marker of the NG tube projects in the left upper quadrant just below the GE junction. This is minimally advanced from the comparison study performed 2 hours prior. 2. Large colonic stool burden. 3. Stable opacities in the lung bases could reflect atelectasis and/or airspace disease. Electronically Signed   By: Kreg Shropshire M.D.   On: 09/09/2019 20:33   DG Abd 1 View  Result Date: 09/08/2019 CLINICAL DATA:  Nasogastric tube placement. EXAM: ABDOMEN - 1 VIEW COMPARISON:  September 05, 2019. FINDINGS: The bowel gas pattern is normal. Distal tip of nasogastric tube is seen in proximal stomach. No radio-opaque calculi or other significant radiographic abnormality are seen. IMPRESSION: Distal tip of nasogastric tube seen in proximal stomach. Electronically Signed   By: Lupita Raider M.D.   On: 09/08/2019 12:21   DG Abdomen 1 View  Result Date: 09/05/2019 CLINICAL DATA:  OG tube placement EXAM: ABDOMEN - 1 VIEW COMPARISON:  02/23/2019, 09/04/2019 FINDINGS:  Esophageal tube tip overlies the proximal to mid stomach. Side port slightly beyond GE junction. Airspace disease at both lung bases. IMPRESSION: Esophageal tube tip overlies the proximal to mid stomach. Electronically Signed   By: Jasmine Pang M.D.   On: 09/05/2019 03:27   DG Chest Port 1 View  Result Date: 09/10/2019 CLINICAL DATA:  Atelectasis. EXAM: PORTABLE CHEST 1 VIEW COMPARISON:  09/08/2019. FINDINGS: Interim extubation. Feeding tube noted with tip in the upper most portion of the stomach. Heart size stable. Bilateral interstitial infiltrates again noted without interim change. No pleural effusion or pneumothorax. IMPRESSION: 1. Interim extubation. Feeding tube noted with tip over the upper stomach. 2. Bilateral interstitial infiltrates again noted without interim change. Electronically Signed   By: Maisie Fus  Register   On: 09/10/2019 05:56   DG Chest Port 1 View  Result Date: 09/08/2019 CLINICAL DATA:  Respiratory failure. EXAM: PORTABLE CHEST 1 VIEW COMPARISON:  09/07/2019.  09/04/2019. FINDINGS: Endotracheal tube tip 2 cm above the carina. NG tube noted with tip below left hemidiaphragm. Heart size normal. Bilateral pulmonary interstitial infiltrates noted with improved aeration from prior exam. Improved bibasilar atelectasis. No pleural effusion or pneumothorax. IMPRESSION: 1. Endotracheal tube tip 2 cm above the carina. NG tube noted with tip below left hemidiaphragm. 2. Bilateral pulmonary interstitial infiltrates noted with improved aeration from prior exam. Improved bibasilar atelectasis. Electronically Signed   By: Maisie Fus  Register   On: 09/08/2019 06:04   DG CHEST PORT 1 VIEW  Result Date: 09/07/2019 CLINICAL DATA:  Ventilator dependence. EXAM: PORTABLE CHEST 1 VIEW COMPARISON:  09/04/2019 FINDINGS: Limited study due to patient positioning with base obscuring the left apex. Endotracheal tube tip is approximately 1.6 cm above the base of the carina. NG tube tip overlies the mid stomach.  Low lung volumes with asymmetric elevation left hemidiaphragm. Similar appearance diffuse interstitial opacity with improvement in the airspace disease seen at the right base previously. Similar appearance of left basilar airspace opacity. Bones are diffusely demineralized. Telemetry leads overlie the chest. IMPRESSION: 1. Interval improvement in aeration at the right lung base. 2. Similar appearance of left basilar airspace disease and diffuse bilateral interstitial  opacity. Electronically Signed   By: Kennith Center M.D.   On: 09/07/2019 07:45   DG Chest Portable 1 View  Result Date: 09/05/2019 CLINICAL DATA:  Intubated EXAM: PORTABLE CHEST 1 VIEW COMPARISON:  09/04/2019, CT 05/14/2019 FINDINGS: Interval intubation, tip of the endotracheal tube is about 2 cm superior to the carina. Right greater than left lower lung airspace disease. No pleural effusion. Stable cardiomediastinal silhouette with aortic atherosclerosis. No pneumothorax IMPRESSION: 1. Endotracheal tube tip about 2 cm superior to the carina. 2. Right greater than left lower lung airspace disease suspicious for pneumonia and or aspiration. Electronically Signed   By: Jasmine Pang M.D.   On: 09/05/2019 00:01   DG Chest Port 1 View  Result Date: 09/04/2019 CLINICAL DATA:  Shortness of breath EXAM: PORTABLE CHEST 1 VIEW COMPARISON:  06/19/2019 FINDINGS: Cardiac shadow is stable. Increasing right basilar infiltrate is noted likely related aspiration given the patient's clinical history. Mild central vascular congestion is seen. No bony abnormality is noted. IMPRESSION: Increasing right basilar infiltrative density likely related aspiration given the patient's clinical history. Electronically Signed   By: Alcide Clever M.D.   On: 09/04/2019 21:30   DG Abd Portable 1V  Result Date: 09/09/2019 CLINICAL DATA:  Feeding tube placement EXAM: PORTABLE ABDOMEN - 1 VIEW COMPARISON:  09/08/2019 FINDINGS: None nasoenteric feeding tube is in place with its  tip just beyond the gastroesophageal junction. The majority of the abdomen is excluded from view. The visualized lung bases are clear. Lucency laterally along the left hemithorax relates to skin folds and is artifactual. IMPRESSION: Nasoenteric feeding tube just beyond the gastroesophageal junction. Advancement of the tube by 20-30 cm may be helpful to allow advancement into the duodenum. Electronically Signed   By: Helyn Numbers MD   On: 09/09/2019 19:14   EEG adult  Result Date: 09/07/2019 Charlsie Quest, MD     09/07/2019  2:27 PM Patient Name: KAELIN DEMAN MRN: [id number] Epilepsy Attending: Charlsie Quest Referring Physician/Provider: Zenia Resides, NP Date: 09/07/2019 Duration: 26.29 mins Patient history: 69yo M with h/o epilepsy now with ams. EEG to evaluate for seizure Level of alertness:  comatose AEDs during EEG study: LEV, CBZ, Clonazepam, LCM, VPA, propofol Technical aspects: This EEG study was done with scalp electrodes positioned according to the 10-20 International system of electrode placement. Electrical activity was acquired at a sampling rate of 500Hz  and reviewed with a high frequency filter of 70Hz  and a low frequency filter of 1Hz . EEG data were recorded continuously and digitally stored. Description:  EEG showed continuous generalized 3 to 6 Hz theta-delta slowing.  Hyperventilation and photic stimulation were not performed.   ABNORMALITY -Continuousslow, generalized IMPRESSION: This study is suggestive of severe diffuse encephalopathy, nonspecific etiology. No seizures or epileptiform discharges were seen throughout the recording. Charlsie Quest    Microbiology: No results found for this or any previous visit (from the past 240 hour(s)).   Labs: Basic Metabolic Panel: Recent Labs  Lab 09/09/19 0243 09/11/19 0804 09/12/19 0511  NA 145 138 130*  K 4.2 3.8 3.7  CL 102 99 92*  CO2 31 29 27   GLUCOSE 137* 131* 125*  BUN 12 12 10   CREATININE 0.53* 0.41* 0.48*   CALCIUM 8.8* 8.3* 7.5*  MG  --  1.9 1.7  PHOS 2.5 2.8 2.9   Liver Function Tests: No results for input(s): AST, ALT, ALKPHOS, BILITOT, PROT, ALBUMIN in the last 168 hours. No results for input(s): LIPASE, AMYLASE in the last 168  hours. No results for input(s): AMMONIA in the last 168 hours. CBC: Recent Labs  Lab 09/11/19 0804 09/12/19 0511  WBC 13.1* 16.3*  NEUTROABS 10.0* 13.3*  HGB 12.7* 12.7*  HCT 39.1 38.7*  MCV 97.3 97.7  PLT 545* 469*   Cardiac Enzymes: No results for input(s): CKTOTAL, CKMB, CKMBINDEX, TROPONINI in the last 168 hours. BNP: BNP (last 3 results) Recent Labs    02/23/19 1626 03/29/19 1318  BNP 76.6 62.9    ProBNP (last 3 results) No results for input(s): PROBNP in the last 8760 hours.  CBG: Recent Labs  Lab 09/14/19 1640 09/14/19 2042 09/15/19 0014 09/15/19 0803 09/15/19 1102  GLUCAP 107* 90 109* 77 94    Principal Problem:   Sepsis due to Escherichia coli (E. coli) (HCC) Active Problems:   Acute metabolic encephalopathy   Dementia (HCC)   Seizure disorder (HCC)   Protein-calorie malnutrition, severe   AKI (acute kidney injury) (HCC)   Acute respiratory failure with hypoxia and hypercarbia (HCC)   Aspiration pneumonia of both lower lobes due to gastric secretions (HCC)   Urinary tract infection due to extended-spectrum beta lactamase (ESBL) producing Escherichia coli   Failure to thrive in adult   Sacral decubitus ulcer, stage II (HCC)   Time coordinating discharge: 38 minutes.  Signed:        Vicci Reder, DO [facility]  09/15/2019, 12:06 PM

## 2019-09-15 NOTE — Progress Notes (Signed)
Patient to be transferred to [facility], report called to receiving RN, Okey Regal

## 2019-09-15 NOTE — Progress Notes (Signed)
Daily Progress Note   Lee [name] Name: Lee [name]       Date: 09/15/2019 DOB: 1950-02-26  Age: 69 y.o. MRN#: [id number] Attending Physician: Lee Kirks, DO Primary Care Physician: Lee [name], No Pcp Per Admit Date: 09/04/2019  Reason for Consultation/Follow-up: Establishing goals of care  Subjective: I saw and examined Lee [name] for stability to transport to residential hospice and evaluate for symptom management prior to discharge.  While he tracked me in the room, he was nonverbal and did not appear to be in distress.  Length of Stay: 10  Current Medications: Scheduled Meds:  . amLODipine  5 mg Per Tube Daily  . carBAMazepine  400 mg Per Tube TID  . chlorhexidine gluconate (MEDLINE KIT)  15 mL Mouth Rinse BID  . Chlorhexidine Gluconate Cloth  6 each Topical Daily  . clonazepam  0.25 mg Per Tube BID  . collagenase   Topical Daily  . docusate  100 mg Per Tube BID  . enoxaparin (LOVENOX) injection  40 mg Subcutaneous Q24H  . hydrALAZINE  50 mg Per Tube Q8H  . lacosamide  300 mg Per Tube BID  . levETIRAcetam  1,500 mg Per Tube BID  . mouth rinse  15 mL Mouth Rinse 10 times per day  . pantoprazole sodium  40 mg Per Tube Daily  . polyethylene glycol  17 g Per Tube Daily  . polyvinyl alcohol  2 drop Left Eye BID  . valproic acid  750 mg Per Tube Q8H    Continuous Infusions:   PRN Meds: acetaminophen (TYLENOL) oral liquid 160 mg/5 mL, albuterol, docusate, polyethylene glycol  Physical Exam         Frail, elderly appearing male Muscle wasting and contractures NGT in place S1, S2 Abd soft  Vital Signs: BP (!) 163/83 (BP Location: Left Arm)   Pulse 89   Temp 98.6 F (37 C) (Axillary)   Resp 20   Ht $R'5\' 11"'PK$  (1.803 m)   Wt 53.1 kg   SpO2 94%   BMI 16.33 kg/m  SpO2:  SpO2: 94 % O2 Device: O2 Device: Room Air O2 Flow Rate: O2 Flow Rate (L/min): 1 L/min  Intake/output summary:   Intake/Output Summary (Last 24 hours) at 09/15/2019 1502 Last data filed at 09/15/2019 1100 Gross per 24 hour  Intake 120 ml  Output  450 ml  Net -330 ml   LBM: Last BM Date: 09/13/19 Baseline Weight: Weight: 61 kg (entering for RN for propofol processing ) Most recent weight: Weight: 53.1 kg       Palliative Assessment/Data:      Lee [name] Active Problem List   Diagnosis Date Noted  . Sacral decubitus ulcer, stage II ([place]) 09/15/2019  . Aspiration pneumonia of both lower lobes due to gastric secretions ([place]) 09/11/2019  . Urinary tract infection due to extended-spectrum beta lactamase (ESBL) producing Escherichia coli 09/11/2019  . Sepsis due to Escherichia coli (E. coli) ([place]) 09/11/2019  . Failure to thrive in adult 09/11/2019  . Acute respiratory failure with hypoxia and hypercarbia ([place]) 09/05/2019  . Metabolic acidosis with increased anion gap and accumulation of organic acids 06/19/2019  . Lactic acidosis 06/19/2019  . AKI (acute kidney injury) ([place]) 06/19/2019  . Palliative care by specialist   . Goals of care, counseling/discussion   . DNR (do not resuscitate)   . Multifocal pneumonia 05/14/2019  . Sepsis ([place]) 03/29/2019  . Macrocytosis   . Protein-calorie malnutrition, severe 03/04/2019  . Acute respiratory failure with hypoxemia ([place]) 02/23/2019  . Pressure injury of skin 06/30/2018  . Acute on chronic respiratory failure with hypoxia ([place])   . Seizure disorder ([place]) 06/25/2018  . Seizure ([place]) 08/29/2016  . Leukocytosis 08/29/2016  . History of CVA (cerebrovascular accident) 08/29/2016  . Somnolence 08/29/2016  . Post-ictal state ([place]) 08/29/2016  . Lip laceration   . Pain   . Leg weakness, bilateral   . Leg pain, bilateral 08/17/2016  . UTI (urinary tract infection) 08/15/2016  . Aggression 07/26/2016  . Dementia ([place]) 07/26/2016  . Elevated  troponin 12/30/2015  . Chest pain 12/30/2015  . Aspiration pneumonia ([place]) 11/15/2013  . Status epilepticus ([place]) 11/14/2013  . Essential hypertension 11/14/2013  . Cerebral thrombosis with cerebral infarction ([place]) 11/07/2013  . Seizures ([place]) 11/06/2013  . Chronic airway obstruction, not elsewhere classified 09/22/2013  . Stroke ([place]) 09/22/2013  . Hyperlipidemia 09/18/2013  . Encephalopathy acute 09/11/2013  . Altered mental status 09/11/2013  . CVA (cerebral infarction) 09/11/2013  . Hyponatremia 09/11/2013  . Acute metabolic encephalopathy 00/17/4944  . Aftercare following surgery of the circulatory system, [place] 08/25/2012  . Foot swelling 02/04/2012  . Drainage from wound 01/04/2012  . Peripheral vascular disease ([place]) 11/05/2011  . Pain in limb 11/05/2011  . Chronic total occlusion of artery of the extremities ([place]) 11/05/2011  . PAD (peripheral artery disease) ([place]) 10/28/2011  . TOBACCO ABUSE 02/06/2007  . SYMPTOM, EDEMA 06/13/2006  . ERECTILE DYSFUNCTION 01/25/2006    Palliative Care Assessment & Plan   Lee [name] Profile:    Assessment:  69 year old gentleman, known to palliative service seen in previous consultations, from [facility] facility where he has been for the past 4 years now.  Lee [name] has past medical history significant for COPD, dementia, history of seizure disorder, hypertension, peripheral vascular disease, history of stroke.  Lee [name] has a recent history of hospitalizations for aspiration, urinary tract infection, encephalopathy and also seizures.  Lee [name]'s wife Lee [name] is his next of kin.  Lee [name] has been admitted to the hospital with acute hypoxic respiratory failure requiring intubation.  At the facility he was found to be 75% oxygen saturation on room air.  Lee [name] was intubated from 8-27 through 8-30.  He was extubated on 8-31.  He was found to have E. coli.  Lee [name] has NG tube and was being given meds through the NG tube as well  as tube  feeds.  Palliative consultation for broad goals of care discussions, discussions pertaining to artificial nutrition and hydration.   Recommendations/Plan: Plan for transfer to [facility] for end of life care.  He appears to be comfortable on my exam and stable for transfer to residential hospice today.  Goals of Care and Additional Recommendations: Limitations on Scope of Treatment: Full Comfort Care  Code Status:    Code Status Orders  (From admission, onward)         Start     Ordered   09/12/19 1148  Do not attempt resuscitation (DNR)  Continuous       Question Answer Comment  In the event of cardiac or respiratory ARREST Do not call a "code blue"   In the event of cardiac or respiratory ARREST Do not perform Intubation, CPR, defibrillation or ACLS   In the event of cardiac or respiratory ARREST Use medication by any route, position, wound care, and other measures to relive pain and suffering. May use oxygen, suction and manual treatment of airway obstruction as needed for comfort.      09/12/19 1148        Code Status History    Date Active Date Inactive Code Status Order ID Comments User Context   09/05/2019 0157 09/12/2019 1147 Partial Code [id number]  Milagros Loll, MD ED   06/19/2019 1618 06/24/2019 2044 DNR [id number]  Norval Morton, MD ED   05/16/2019 0928 05/17/2019 2017 DNR [id number]  Rosezella Rumpf, NP Inpatient   05/14/2019 1653 05/16/2019 0928 Full Code [id number]  Lequita Halt, MD ED   03/29/2019 1604 04/03/2019 2230 Full Code [id number]  Mckinley Jewel, MD ED   02/23/2019 1824 03/06/2019 2308 Full Code [id number]  Johnsie Cancel, NP ED   06/25/2018 0844 07/06/2018 1635 Full Code [id number]  Cristal Generous, NP ED   03/22/2018 0725 03/26/2018 2128 Full Code [id number]  Kipp Brood, MD ED   08/29/2016 1944 08/31/2016 1622 Full Code [id number]  Mariel Aloe, MD Inpatient   08/15/2016 0407 08/21/2016 1951 Full Code [id number]  Karmen Bongo, MD ED   07/25/2016 1601  07/26/2016 1305 Full Code [id number]  Varney Biles, MD ED   12/30/2015 1542 01/01/2016 1332 Full Code [id number]  Elwin Mocha, MD ED   11/14/2013 1648 11/20/2013 1416 Full Code [id number]  Kinnie Feil, MD Inpatient   11/06/2013 1342 11/09/2013 2144 Full Code [id number]  Hosie Poisson, MD Inpatient   09/11/2013 2355 09/16/2013 2026 Full Code [id number]  Rise Patience, MD Inpatient   01/22/2012 1452 01/29/2012 1826 Full Code [id number]  Elmore Guise, RN Inpatient   12/13/2011 1926 12/19/2011 1743 Full Code [id number]  Wells Guiles, RN Inpatient   Advance Care Planning Activity      Prognosis:  < 2 weeks  Discharge Planning: Hospice facility  Thank you for allowing the Palliative Medicine Team to assist in the care of this Lee [name].   Total Time 20 Prolonged Time Billed No    Greater than 50%  of this time was spent counseling and coordinating care related to the above assessment and plan.  Micheline Rough, MD  Please contact Palliative Medicine Team phone at [phone] for questions and concerns.

## 2019-09-15 NOTE — TOC Progression Note (Signed)
Transition of Care [name]) - Progression Note    Patient Details  Name: REMO KIRSCHENMANN MRN: [id number] Date of Birth: [birth year]  Transition of Care [facility]) CM/SW Contact  Geni Bers, RN Phone Number: 09/15/2019, 10:11 AM  Clinical Narrative:    [facility] has offered a bed for pt. Waiting for paper work to be completed by pt's wife with [facility], before transporting.    Expected Discharge Plan: Home/Self Care Barriers to Discharge: Continued Medical Work up  Expected Discharge Plan and Services Expected Discharge Plan: Home/Self Care   Discharge Planning Services: CM Consult   Living arrangements for the past 2 months: Single Family Home                                       Social Determinants of Health (SDOH) Interventions    Readmission Risk Interventions Readmission Risk Prevention Plan 06/22/2019  Transportation Screening Complete  PCP or Specialist Appt within 3-5 Days Complete  HRI or Home Care Consult Complete  Social Work Consult for Recovery Care Planning/Counseling Complete  Palliative Care Screening Complete  Medication Review [facility]) Referral to Pharmacy  Some recent data might be hidden

## 2020-06-12 IMAGING — CT CT HEAD WITHOUT CONTRAST
3 series · 15 of 47 positions shown, 18 images · non-contrast
Comparison: 07/24/2017

CLINICAL DATA: Seizure.  Known seizure history.

EXAM:
CT HEAD WITHOUT CONTRAST
TECHNIQUE: Contiguous axial images were obtained from the base of the skull
through the vertex without intravenous contrast.

[Series 2: head wo · axial · 0.49mm/px · z∈[-343,-198]mm · 9 of 35 slices shown, 12 images]
[im 3/35  brain]
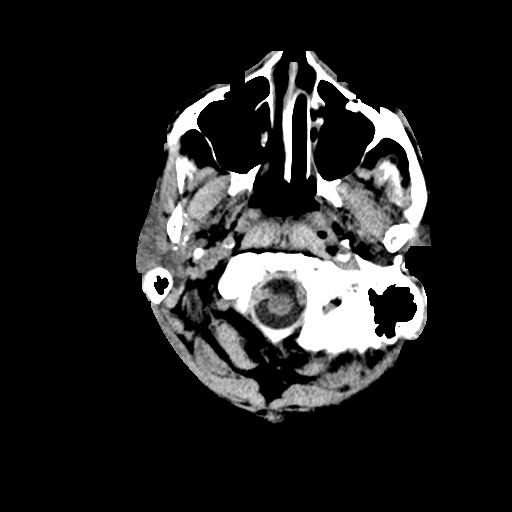
[im 3/35  bone]
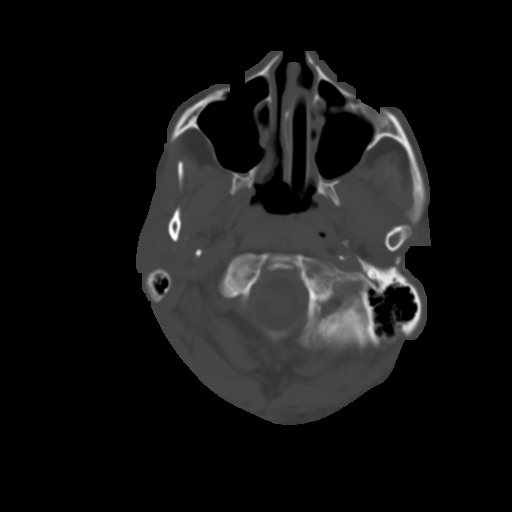
[im 6/35  brain]
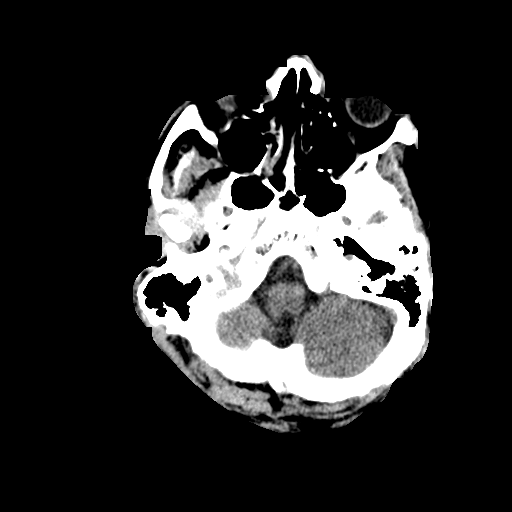
[im 10/35  brain]
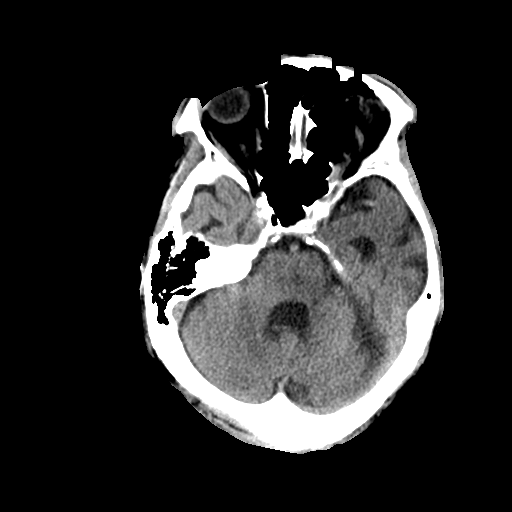
[im 13/35  brain]
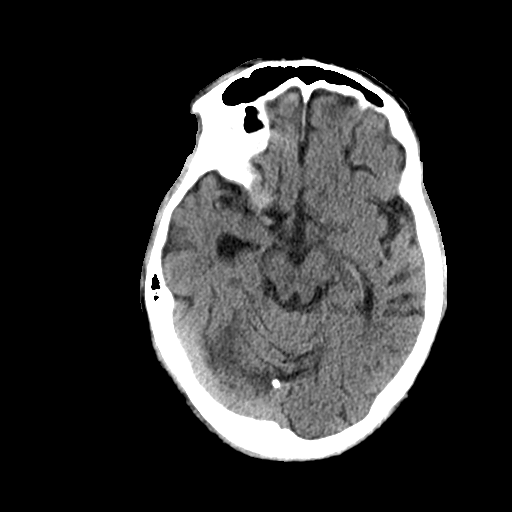
[im 18/35  brain]
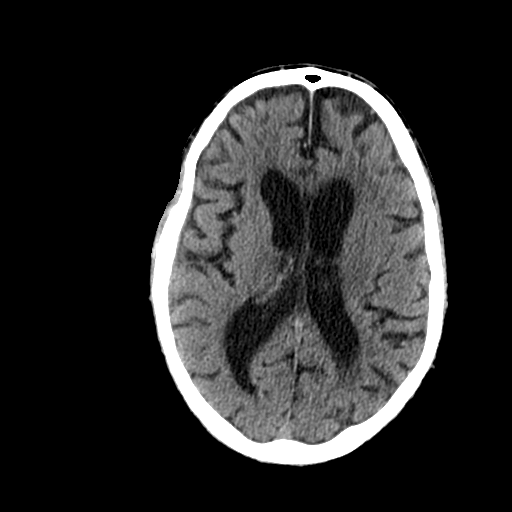
[im 18/35  bone]
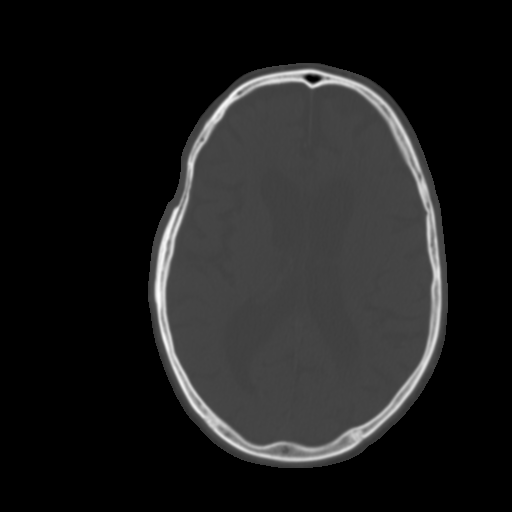
[im 22/35  brain]
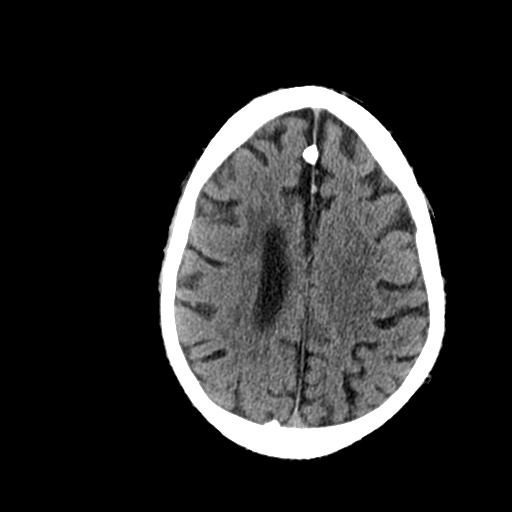
[im 25/35  brain]
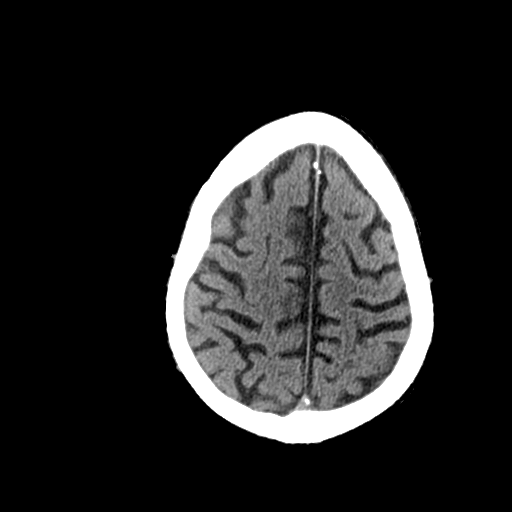
[im 29/35  brain]
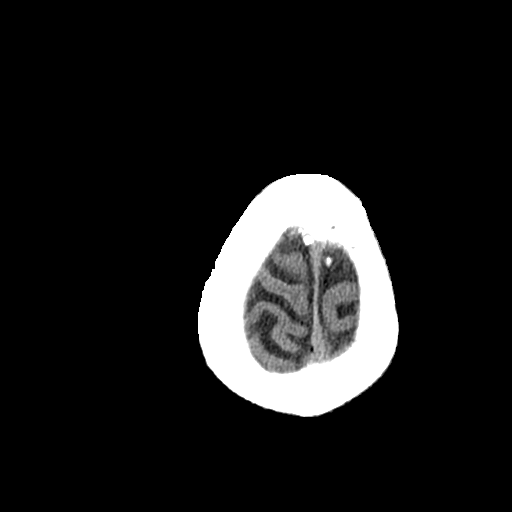
[im 32/35  brain]
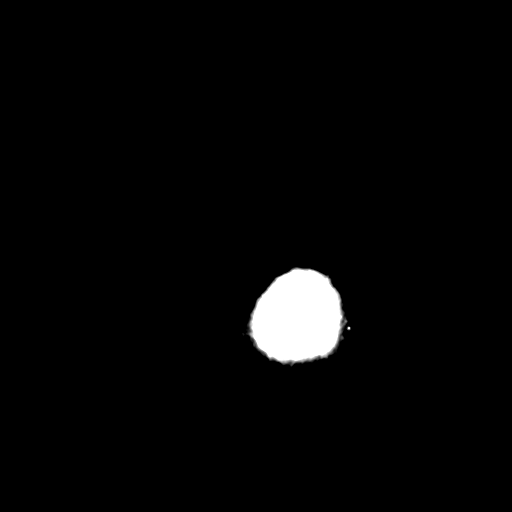
[im 32/35  bone]
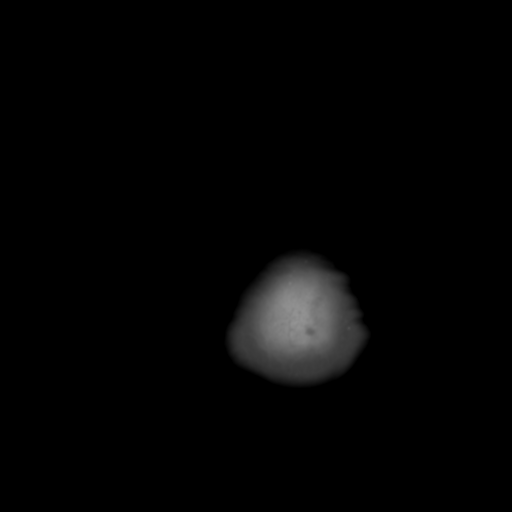

[Series 4: coronal soft tissue · coronal · 0.30mm/px · 3 of 76 slices shown]
[im 26/76  brain]
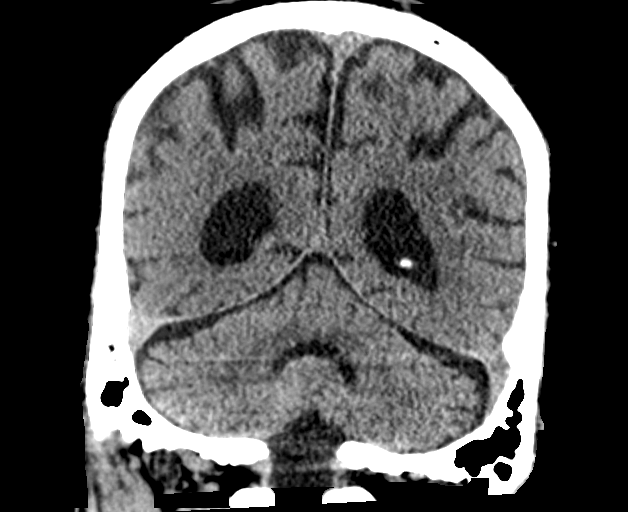
[im 34/76  brain]
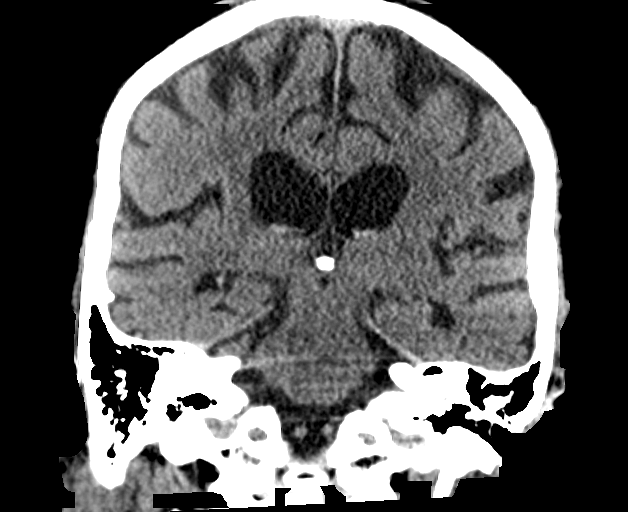
[im 42/76  brain]
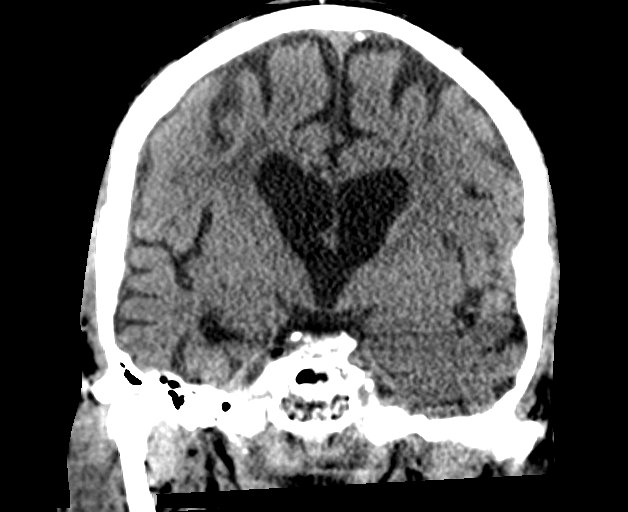

[Series 5: sagittal soft tissue · sagittal · 0.33mm/px · 3 of 53 slices shown]
[im 18/53  brain]
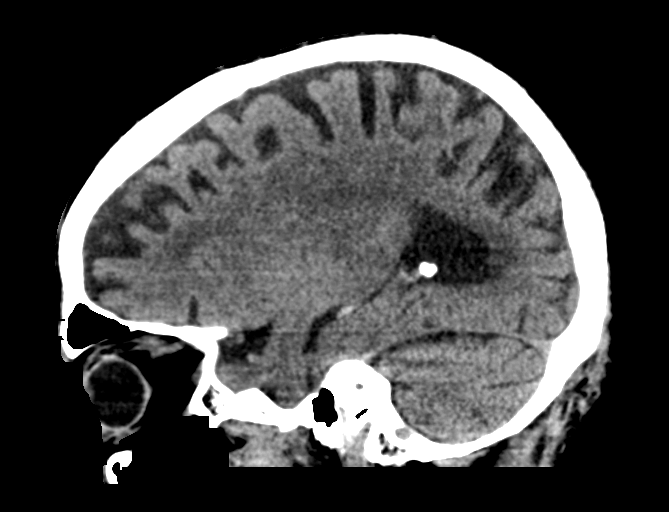
[im 27/53  brain]
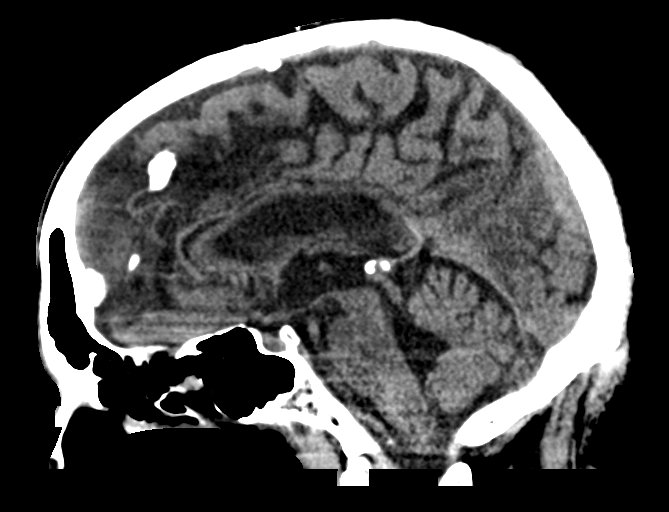
[im 35/53  brain]
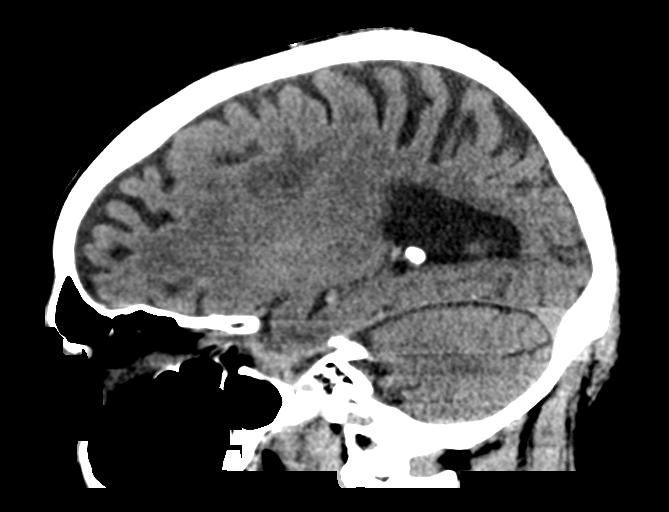

[15 of 47 positions shown; findings below may reference images not displayed]

FINDINGS: Brain: Generalized brain atrophy. Chronic small-vessel ischemic
changes of the cerebral hemispheric white matter. Old lacunar
infarction right upper thalamus. Chronic small-vessel ischemic
changes of the cerebral hemispheric white matter. No sign of acute
infarction, mass lesion, hemorrhage, hydrocephalus or extra-axial
collection.

Vascular: There is atherosclerotic calcification of the major
vessels at the base of the brain.

Skull: Negative

Sinuses/Orbits: Clear/normal

Other: Intubated.
IMPRESSION: No acute finding by CT. Atrophy and chronic small-vessel ischemic
changes as outlined above.

## 2020-06-13 IMAGING — DX ABDOMEN - 1 VIEW
1 series · 1 of 1 positions shown · non-contrast
Comparison: None.

CLINICAL DATA: Nasogastric tube placement.

EXAM:
ABDOMEN - 1 VIEW

[abdomen]
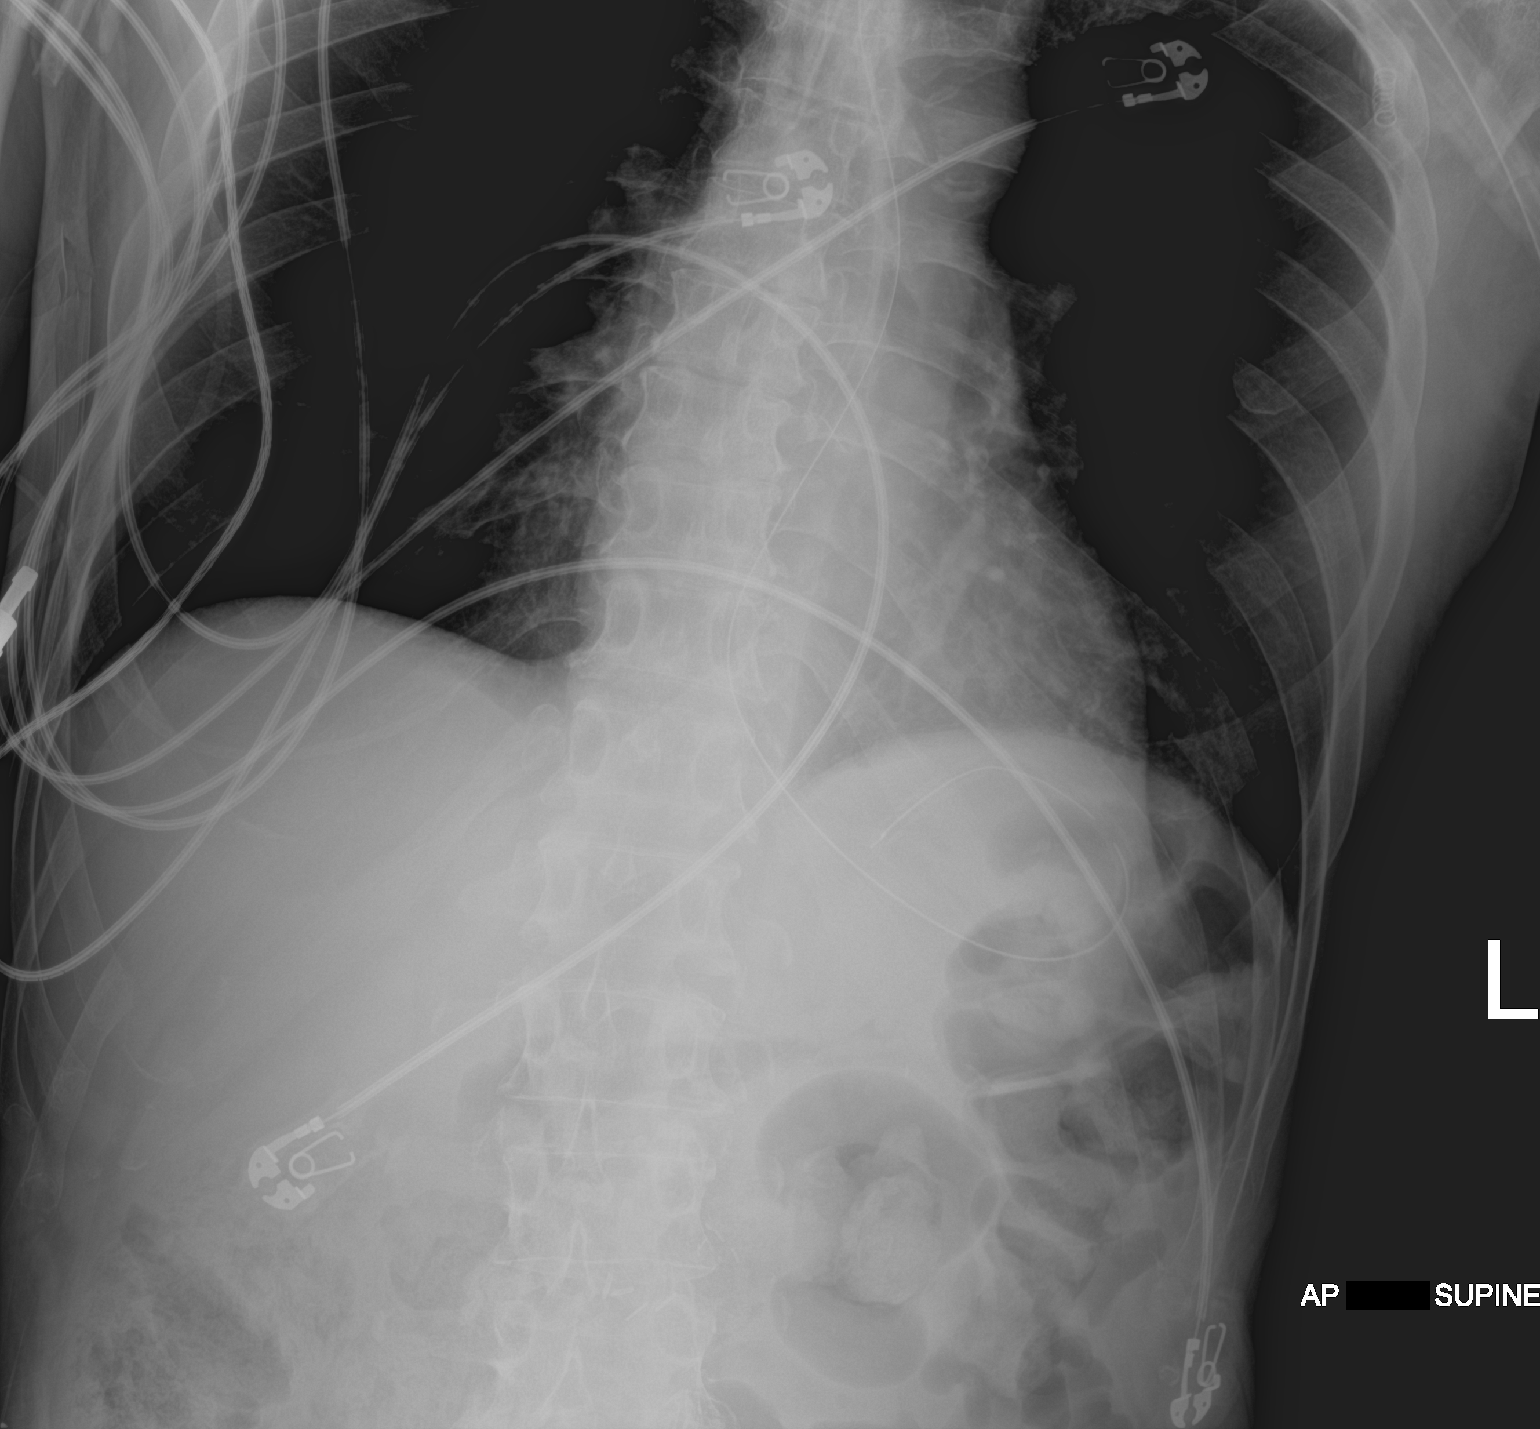

[1 of 1 positions shown; findings below may reference images not displayed]

FINDINGS: Nasogastric tube enters the stomach and is coiled in the fundus.
IMPRESSION: Nasogastric tube enters the stomach and is coiled in the fundus.
